# Patient Record
Sex: Male | Born: 1935 | Race: White | Hispanic: No | Marital: Married | Smoking: Former smoker
Health system: Southern US, Community
[De-identification: ages and names within clinical notes are randomized; demographics above are authoritative.]

## PROBLEM LIST (undated history)

## (undated) DIAGNOSIS — D649 Anemia, unspecified: Secondary | ICD-10-CM

## (undated) DIAGNOSIS — K279 Peptic ulcer, site unspecified, unspecified as acute or chronic, without hemorrhage or perforation: Secondary | ICD-10-CM

## (undated) DIAGNOSIS — N400 Enlarged prostate without lower urinary tract symptoms: Secondary | ICD-10-CM

## (undated) DIAGNOSIS — R9430 Abnormal result of cardiovascular function study, unspecified: Secondary | ICD-10-CM

## (undated) DIAGNOSIS — H269 Unspecified cataract: Secondary | ICD-10-CM

## (undated) DIAGNOSIS — I10 Essential (primary) hypertension: Secondary | ICD-10-CM

## (undated) DIAGNOSIS — K219 Gastro-esophageal reflux disease without esophagitis: Secondary | ICD-10-CM

## (undated) DIAGNOSIS — C44101 Unspecified malignant neoplasm of skin of unspecified eyelid, including canthus: Secondary | ICD-10-CM

## (undated) DIAGNOSIS — T4145XA Adverse effect of unspecified anesthetic, initial encounter: Secondary | ICD-10-CM

## (undated) DIAGNOSIS — T8859XA Other complications of anesthesia, initial encounter: Secondary | ICD-10-CM

## (undated) DIAGNOSIS — I4819 Other persistent atrial fibrillation: Secondary | ICD-10-CM

## (undated) DIAGNOSIS — M1990 Unspecified osteoarthritis, unspecified site: Secondary | ICD-10-CM

## (undated) DIAGNOSIS — IMO0002 Reserved for concepts with insufficient information to code with codable children: Secondary | ICD-10-CM

## (undated) DIAGNOSIS — L0390 Cellulitis, unspecified: Secondary | ICD-10-CM

## (undated) DIAGNOSIS — E785 Hyperlipidemia, unspecified: Secondary | ICD-10-CM

## (undated) DIAGNOSIS — I4891 Unspecified atrial fibrillation: Secondary | ICD-10-CM

## (undated) DIAGNOSIS — I2510 Atherosclerotic heart disease of native coronary artery without angina pectoris: Secondary | ICD-10-CM

## (undated) HISTORY — DX: Abnormal result of cardiovascular function study, unspecified: R94.30

## (undated) HISTORY — DX: Unspecified cataract: H26.9

## (undated) HISTORY — DX: Hyperlipidemia, unspecified: E78.5

## (undated) HISTORY — PX: EYELID CARCINOMA EXCISION: SHX1563

## (undated) HISTORY — DX: Essential (primary) hypertension: I10

## (undated) HISTORY — DX: Unspecified atrial fibrillation: I48.91

## (undated) HISTORY — DX: Unspecified malignant neoplasm of skin of unspecified eyelid, including canthus: C44.101

## (undated) HISTORY — DX: Atherosclerotic heart disease of native coronary artery without angina pectoris: I25.10

## (undated) HISTORY — PX: TOTAL HIP ARTHROPLASTY: SHX124

## (undated) HISTORY — DX: Gastro-esophageal reflux disease without esophagitis: K21.9

## (undated) HISTORY — PX: TOTAL KNEE ARTHROPLASTY: SHX125

## (undated) HISTORY — DX: Unspecified osteoarthritis, unspecified site: M19.90

## (undated) HISTORY — PX: APPENDECTOMY: SHX54

## (undated) HISTORY — DX: Reserved for concepts with insufficient information to code with codable children: IMO0002

## (undated) HISTORY — DX: Other persistent atrial fibrillation: I48.19

## (undated) HISTORY — DX: Anemia, unspecified: D64.9

## (undated) HISTORY — DX: Cellulitis, unspecified: L03.90

## (undated) HISTORY — DX: Peptic ulcer, site unspecified, unspecified as acute or chronic, without hemorrhage or perforation: K27.9

---

## 1999-08-29 ENCOUNTER — Other Ambulatory Visit: Admission: RE | Admit: 1999-08-29 | Discharge: 1999-08-29 | Payer: Self-pay | Admitting: Urology

## 2000-12-06 ENCOUNTER — Ambulatory Visit (HOSPITAL_COMMUNITY): Admission: RE | Admit: 2000-12-06 | Discharge: 2000-12-06 | Payer: Self-pay | Admitting: Cardiology

## 2000-12-14 ENCOUNTER — Ambulatory Visit (HOSPITAL_COMMUNITY): Admission: RE | Admit: 2000-12-14 | Discharge: 2000-12-14 | Payer: Self-pay | Admitting: Family Medicine

## 2000-12-14 ENCOUNTER — Encounter: Payer: Self-pay | Admitting: Family Medicine

## 2003-10-13 HISTORY — PX: OTHER SURGICAL HISTORY: SHX169

## 2003-10-13 HISTORY — PX: BYPASS GRAFT: SHX909

## 2003-11-01 ENCOUNTER — Ambulatory Visit (HOSPITAL_COMMUNITY): Admission: RE | Admit: 2003-11-01 | Discharge: 2003-11-01 | Payer: Self-pay | Admitting: Internal Medicine

## 2004-01-11 HISTORY — PX: CORONARY ARTERY BYPASS GRAFT: SHX141

## 2004-01-23 ENCOUNTER — Inpatient Hospital Stay (HOSPITAL_COMMUNITY): Admission: RE | Admit: 2004-01-23 | Discharge: 2004-02-08 | Payer: Self-pay | Admitting: Orthopedic Surgery

## 2004-02-08 ENCOUNTER — Encounter: Payer: Self-pay | Admitting: Thoracic Surgery (Cardiothoracic Vascular Surgery)

## 2004-04-03 ENCOUNTER — Emergency Department (HOSPITAL_COMMUNITY): Admission: EM | Admit: 2004-04-03 | Discharge: 2004-04-03 | Payer: Self-pay | Admitting: Emergency Medicine

## 2004-09-06 ENCOUNTER — Inpatient Hospital Stay (HOSPITAL_COMMUNITY): Admission: EM | Admit: 2004-09-06 | Discharge: 2004-09-09 | Payer: Self-pay | Admitting: Emergency Medicine

## 2004-09-06 ENCOUNTER — Encounter (INDEPENDENT_AMBULATORY_CARE_PROVIDER_SITE_OTHER): Payer: Self-pay | Admitting: *Deleted

## 2005-01-01 ENCOUNTER — Ambulatory Visit: Payer: Self-pay | Admitting: Cardiology

## 2005-02-19 ENCOUNTER — Ambulatory Visit: Payer: Self-pay | Admitting: Internal Medicine

## 2005-05-07 ENCOUNTER — Ambulatory Visit: Payer: Self-pay | Admitting: Cardiology

## 2005-05-12 ENCOUNTER — Ambulatory Visit: Payer: Self-pay | Admitting: Internal Medicine

## 2005-07-01 ENCOUNTER — Ambulatory Visit: Payer: Self-pay | Admitting: Cardiology

## 2005-12-24 ENCOUNTER — Ambulatory Visit: Payer: Self-pay | Admitting: Internal Medicine

## 2006-03-11 ENCOUNTER — Ambulatory Visit: Payer: Self-pay | Admitting: Internal Medicine

## 2007-07-27 ENCOUNTER — Ambulatory Visit: Payer: Self-pay

## 2008-03-20 ENCOUNTER — Inpatient Hospital Stay (HOSPITAL_COMMUNITY): Admission: RE | Admit: 2008-03-20 | Discharge: 2008-03-26 | Payer: Self-pay | Admitting: Orthopedic Surgery

## 2008-03-20 ENCOUNTER — Ambulatory Visit: Payer: Self-pay | Admitting: Cardiology

## 2008-04-11 ENCOUNTER — Ambulatory Visit: Payer: Self-pay | Admitting: Cardiology

## 2008-05-09 ENCOUNTER — Ambulatory Visit: Payer: Self-pay | Admitting: Cardiology

## 2008-05-11 ENCOUNTER — Ambulatory Visit: Payer: Self-pay | Admitting: Cardiology

## 2008-05-11 ENCOUNTER — Ambulatory Visit (HOSPITAL_COMMUNITY): Admission: RE | Admit: 2008-05-11 | Discharge: 2008-05-11 | Payer: Self-pay | Admitting: Cardiology

## 2008-05-30 ENCOUNTER — Ambulatory Visit: Payer: Self-pay | Admitting: Cardiology

## 2008-08-29 ENCOUNTER — Ambulatory Visit: Payer: Self-pay | Admitting: Cardiology

## 2008-10-24 ENCOUNTER — Ambulatory Visit: Payer: Self-pay | Admitting: Cardiology

## 2008-10-26 ENCOUNTER — Encounter: Payer: Self-pay | Admitting: Cardiology

## 2008-10-26 ENCOUNTER — Ambulatory Visit: Payer: Self-pay

## 2008-10-31 ENCOUNTER — Ambulatory Visit: Payer: Self-pay | Admitting: Cardiology

## 2009-02-25 DIAGNOSIS — K219 Gastro-esophageal reflux disease without esophagitis: Secondary | ICD-10-CM | POA: Insufficient documentation

## 2009-02-25 DIAGNOSIS — I2510 Atherosclerotic heart disease of native coronary artery without angina pectoris: Secondary | ICD-10-CM | POA: Insufficient documentation

## 2009-02-25 DIAGNOSIS — D649 Anemia, unspecified: Secondary | ICD-10-CM | POA: Insufficient documentation

## 2009-02-25 DIAGNOSIS — M1990 Unspecified osteoarthritis, unspecified site: Secondary | ICD-10-CM | POA: Insufficient documentation

## 2009-02-25 DIAGNOSIS — I4819 Other persistent atrial fibrillation: Secondary | ICD-10-CM | POA: Insufficient documentation

## 2009-02-25 DIAGNOSIS — I10 Essential (primary) hypertension: Secondary | ICD-10-CM

## 2009-02-27 ENCOUNTER — Ambulatory Visit: Payer: Self-pay | Admitting: Cardiology

## 2009-06-19 ENCOUNTER — Encounter (INDEPENDENT_AMBULATORY_CARE_PROVIDER_SITE_OTHER): Payer: Self-pay | Admitting: *Deleted

## 2009-08-28 ENCOUNTER — Encounter: Payer: Self-pay | Admitting: Cardiology

## 2009-12-11 ENCOUNTER — Ambulatory Visit: Payer: Self-pay | Admitting: Cardiology

## 2009-12-11 DIAGNOSIS — E663 Overweight: Secondary | ICD-10-CM | POA: Insufficient documentation

## 2010-11-11 NOTE — Assessment & Plan Note (Signed)
Summary: [facility]  Medications Added FUROSEMIDE 20 MG TABS (FUROSEMIDE) 1 by mouth daily DILTIAZEM HCL 120 MG TABS (DILTIAZEM HCL) 1 by mouth daily CARVEDILOL 12.5 MG TABS (CARVEDILOL) 1 by mouth two times a day CRESTOR 10 MG TABS (ROSUVASTATIN CALCIUM) 1 by mouth daily WARFARIN SODIUM 5 MG TABS (WARFARIN SODIUM) as directed TYLENOL ARTHRITIS PAIN 650 MG CR-TABS (ACETAMINOPHEN) as needed      Allergies Added: ! PENICILLIN  Visit Type:  Follow-up Primary Provider:  Dr. Vernon Prey  CC:  Atrial Fibrillation.  History of Present Illness: The patient presents for followup of his cardiomyopathy, atrial fibrillation and coronary disease. Since I last saw him he has done well. He has had no new cardiovascular symptoms and denies palpitations, presyncope or syncope. He has had no chest discomfort, neck or arm discomfort. He has had no weight gain or edema. He denies any shortness of breath and is able to do yard work and bowl. He has not had PND or orthopnea.  Current Medications (verified): 1)  Furosemide 20 Mg Tabs (Furosemide) .Marland Kitchen.. 1 By Mouth Daily 2)  Diltiazem Hcl 120 Mg Tabs (Diltiazem Hcl) .Marland Kitchen.. 1 By Mouth Daily 3)  Carvedilol 12.5 Mg Tabs (Carvedilol) .Marland Kitchen.. 1 By Mouth Two Times A Day 4)  Crestor 10 Mg Tabs (Rosuvastatin Calcium) .Marland Kitchen.. 1 By Mouth Daily 5)  Warfarin Sodium 5 Mg Tabs (Warfarin Sodium) .... As Directed 6)  Tylenol Arthritis Pain 650 Mg Cr-Tabs (Acetaminophen) .... As Needed  Allergies (verified): 1)  ! Penicillin  Past History:  Past Medical History:  Hypertension, persistent atrial fibrillation,   coronary artery disease (status post CABG in April 2005, LIMA to the   LAD, SVG to PDA, SVG to ramus intermediate), mildly reduced ejection   fraction (EF 40% by echo), anemia, cellulitis in the past,   degenerative joint disease,  gastroesophageal reflux disease, peptic ulcer disease,   skin cancer in  his eyelid due for resection.   Past Surgical History:  1.  Includes heart bypass in 2005.   2. He has had a right total knee arthroplasty  3. Appendectomy.   4. Eyelid surgery (skin cancer resected)  5. Right total hip  Review of Systems       As stated in the HPI and negative for all other systems.   Vital Signs:  Patient profile:   77 year old male Height:      67 inches Weight:      200 pounds BMI:     31.44 Pulse rate:   82 / minute Resp:     16 per minute BP sitting:   126 / 84  (right arm)  Vitals Entered By: Marrion Coy, CNA (December 11, 2009 2:17 PM)  Physical Exam  General:  Well developed, well nourished, in no acute distress. Head:  normocephalic and atraumatic Eyes:  PERRLA/EOM intact; conjunctiva and lids normal. Mouth:  Upper dentures, just agums and palate normal. Oral mucosa normal. Neck:  Neck supple, no JVD. No masses, thyromegaly or abnormal cervical nodes. Chest Wall:  Well-healed sternotomy scar Lungs:  Clear bilaterally to auscultation and percussion. Heart:  Non-displaced PMI, chest non-tender; irregular rate and rhythm, S1, S2 without murmurs, rubs or gallops. Carotid upstroke normal, no bruit. Normal abdominal aortic size, no bruits. Femorals normal pulses, no bruits. Pedals normal pulses. No edema, no varicosities. Abdomen:  Bowel sounds positive; abdomen soft and non-tender without masses, organomegaly, or hernias noted. No hepatosplenomegaly, obese Msk:  Back normal, normal gait. Muscle strength  and tone normal. Extremities:  No clubbing or cyanosis. Neurologic:  Alert and oriented x 3. Skin:  Intact without lesions or rashes. Cervical Nodes:  no significant adenopathy Axillary Nodes:  no significant adenopathy Inguinal Nodes:  no significant adenopathy Psych:  Normal affect.   EKG  Procedure date:  12/11/2009  Findings:      atrial fibrillation, rate 82, axis within normal limits, intervals within normal limits, premature ventricular contractions, no acute ST-T wave changes.  Impression &  Recommendations:  Problem # 1:  [name] (ICD-414.00) The patient had a stress test last year and no new symptoms since then. No further cardiovascular testing is suggested. He will continue with risk reduction.  Problem # 2:  FIBRILLATION, ATRIAL (ICD-427.31) He tolerates fibrillation with anticoagulation and rate control. No change in therapy is indicated. Orders: EKG w/ Interpretation (93000)  Problem # 3:  HYPERTENSION (ICD-401.9) His blood pressure is controlled and he will continue the meds as listed.  Problem # 4:  OVERWEIGHT (ICD-278.02) The patient understands the need to lose weight with diet and exercise.  Patient Instructions: 1)  Your physician recommends that you schedule a follow-up appointment in: 1 year  2)  Your physician recommends that you continue on your current medications as directed. Please refer to the Current Medication list given to you today.

## 2010-12-17 ENCOUNTER — Ambulatory Visit: Payer: Self-pay | Admitting: Cardiology

## 2010-12-24 ENCOUNTER — Ambulatory Visit (INDEPENDENT_AMBULATORY_CARE_PROVIDER_SITE_OTHER): Payer: Medicare Other | Admitting: Cardiology

## 2010-12-24 ENCOUNTER — Encounter: Payer: Self-pay | Admitting: Cardiology

## 2010-12-24 DIAGNOSIS — I2510 Atherosclerotic heart disease of native coronary artery without angina pectoris: Secondary | ICD-10-CM

## 2010-12-30 NOTE — Assessment & Plan Note (Signed)
Summary: 11yr rov [id number] pfh,rn per wife/jml/tt  Medications Added LOVASTATIN 40 MG TABS (LOVASTATIN) 1 by mouth daily VITAMIN D 1000 UNIT TABS (CHOLECALCIFEROL) 1 by mouth daily      Allergies Added:   Visit Type:  Follow-up Primary Provider:  Dr. Vernon Prey  CC:  [name].  History of Present Illness: The patient presents for followup of his known coronary disease. Since I last saw him he has had no new cardiovascular complaints. He has had discomfort in his leg it is related to get a knee replacement. He is still able to walk frequently back and forth in his shop in his yard. With this he denies any chest pressure, neck or arm discomfort. He denies any shortness of breath, PND or orthopnea. He has had no weight gain or edema.  Current Medications (verified): 1)  Furosemide 20 Mg Tabs (Furosemide) .Marland Kitchen.. 1 By Mouth Daily 2)  Diltiazem Hcl 120 Mg Tabs (Diltiazem Hcl) .Marland Kitchen.. 1 By Mouth Daily 3)  Carvedilol 12.5 Mg Tabs (Carvedilol) .Marland Kitchen.. 1 By Mouth Two Times A Day 4)  Lovastatin 40 Mg Tabs (Lovastatin) .Marland Kitchen.. 1 By Mouth Daily 5)  Warfarin Sodium 5 Mg Tabs (Warfarin Sodium) .... As Directed 6)  Tylenol Arthritis Pain 650 Mg Cr-Tabs (Acetaminophen) .... As Needed 7)  Vitamin D 1000 Unit Tabs (Cholecalciferol) .Marland Kitchen.. 1 By Mouth Daily  Allergies (verified): 1)  ! Penicillin  Past History:  Past Medical History: Reviewed history from 12/11/2009 and no changes required.  Hypertension, persistent atrial fibrillation,   coronary artery disease (status post CABG in April 2005, LIMA to the   LAD, SVG to PDA, SVG to ramus intermediate), mildly reduced ejection   fraction (EF 40% by echo), anemia, cellulitis in the past,   degenerative joint disease,  gastroesophageal reflux disease, peptic ulcer disease,   skin cancer in  his eyelid due for resection.   Past Surgical History: Reviewed history from 12/11/2009 and no changes required.  1. Includes heart bypass in 2005.   2. He has had a right  total knee arthroplasty  3. Appendectomy.   4. Eyelid surgery (skin cancer resected)  5. Right total hip  Review of Systems       As stated in the HPI and negative for all other systems.   Vital Signs:  Patient profile:   77 year old male Height:      67 inches Weight:      197 pounds BMI:     30.97 Pulse rate:   88 / minute Resp:     16 per minute BP sitting:   164 / 98  (right arm)  Vitals Entered By: Marrion Coy, CNA (December 24, 2010 1:36 PM)  Physical Exam  General:  Well developed, well nourished, in no acute distress. Head:  normocephalic and atraumatic Mouth:  Upper dentures, just agums and palate normal. Oral mucosa normal. Neck:  Neck supple, no JVD. No masses, thyromegaly or abnormal cervical nodes. Chest Wall:  Well-healed sternotomy scar Lungs:  Clear bilaterally to auscultation and percussion. Heart:  Non-displaced PMI, chest non-tender; irregular rate and rhythm, S1, S2 without murmurs, rubs or gallops. Carotid upstroke normal, no bruit. Normal abdominal aortic size, no bruits. Femorals normal pulses, no bruits. Pedals normal pulses. No edema, no varicosities. Abdomen:  Bowel sounds positive; abdomen soft and non-tender without masses, organomegaly, or hernias noted. No hepatosplenomegaly, obese Msk:  Back normal, normal gait. Muscle strength and tone normal. Extremities:  No clubbing or cyanosis. Neurologic:  Alert and oriented  x 3. Skin:  Intact without lesions or rashes. Cervical Nodes:  no significant adenopathy Inguinal Nodes:  no significant adenopathy Psych:  Normal affect.    EKG  Procedure date:  12/24/2010  Findings:      Atrial fibrillation, rate 95, axis within normal limits, premature ectopic complexes.  Impression & Recommendations:  Problem # 1:  [name] (ICD-414.00) At this point he's had no new cardiovascular symptoms. He will continue with risk reduction. No change in therapy is indicated.  Problem # 2:  FIBRILLATION, ATRIAL  (ICD-427.31) Patient tolerates this rhythm. He tolerate anticoagulation and has had good rate control. No change in therapy is indicated. Orders: EKG w/ Interpretation (93000)  Problem # 3:  HYPERTENSION (ICD-401.9) His blood pressure is slightly elevated. This was noted by Dr. Christell Constant last year as well. However, I reviewed blood pressure diary kept and, although somewhat labile, utilizing a general control. He reports good blood pressure control when he checks it at home or with his nurse at a senior group. Therefore, I will make no change to his regimen.  Patient Instructions: 1)  Your physician recommends that you schedule a follow-up appointment in: 12 months with Dr Antoine Poche in [place] 2)  Your physician recommends that you continue on your current medications as directed. Please refer to the Current Medication list given to you today.

## 2011-02-24 NOTE — Op Note (Signed)
[name], CARMER                  ACCOUNT NO.:  [id number]   MEDICAL RECORD NO.:  [id number]          PATIENT TYPE:  INP   LOCATION:  0005                         FACILITY:  [facility]   PHYSICIAN:  Georges Lynch. Gioffre, M.D.DATE OF BIRTH:  28-Jun-1932   DATE OF PROCEDURE:  03/20/2008  DATE OF DISCHARGE:                               OPERATIVE REPORT   SURGEON:  Georges Lynch. Darrelyn Hillock, M.D.   ASSISTANT:  Madlyn Frankel. Charlann Boxer, M.D. and Jamelle Rushing, P.A.   PREOPERATIVE DIAGNOSIS:  Degenerative arthritis, right hip.   POSTOPERATIVE DIAGNOSIS:  Degenerative arthritis, right hip.   OPERATION:  Right total hip arthroplasty utilizing DePuy system.   1. I utilized a size 5 right Trilock femoral stem.  2. I utilized a size 54-mm ASR cup.  3. I utilized a +2 C tapered head.  4. I utilized a size 47 femoral head.   PROCEDURE:  Under general anesthesia, routine orthopedic prep and  draping of the right hip was carried out with the patient on his left  side.  The patient had 600 mg of clindamycin preop.   At this time, a posterolateral approach to the hip was carried out.  Bleeders identified and cauterized.  Self-retaining retractors were  inserted.  The incision was carried down through the iliotibial band.  Self-retaining tractors were advanced, with great care not to injure the  sciatic nerve.  At this time, I partially detached the external  rotators.  I dissected the gluteal muscle by blunt dissection proximally  and identified the capsule.  I first incised the piriformis and then did  a capsulectomy and dislocated the head.  Following that, we amputated  the head at the appropriate neck length with the oscillating saw.  We  then utilized our box chisel to get out wide enough on the trochanter.  We then the inserted the canal finder and then rasped the femoral shaft  up to a size 5.  Once the femoral shaft was prepared, we irrigated out  the shaft.  Then attention was directed to the acetabulum.   We completed  a capsulectomy.  I then reamed the acetabulum up to a size 53 mm.  Following that, we then inserted our 54-mm ASR cup.  We made sure we had  good fixation of the cup.  Following that, we went back and inserted our  size 5 Trilock stem.  Prior to doing that, we did utilize the calcar  reamer to even out the femoral neck.  Prior to inserting our permanent  prosthesis, we obviously went through the trials.  We measured leg  lengths and selected a +2 C tapered head.  After the trial reduction was  completed, we then inserted our permanent Trilock stem size 5.  Following that, we went through trials again to make sure that we had  the appropriate head size, etc.  We measured leg lengths once again.  We  then inserted our permanent 47-mm ball with a +2 spacer and made sure  the acetabulum  was free of any soft tissue, and  then we reduced the  hip and had excellent motion.  We went through leg  lengths again.  We had excellent leg length, excellent stability.  We  then reapproximated our soft tissue in the usual fashion.  The skin was  closed with metal staples.  Sterile Neosporin dressing was applied.           ______________________________  Georges Lynch Darrelyn Hillock, M.D.     RAG/MEDQ  D:  03/20/2008  T:  03/20/2008  Job:  [id number]   cc:   [name], MD, [facility]  [address]  [address]  [place]  [place] [address]

## 2011-02-24 NOTE — Assessment & Plan Note (Signed)
[facility]                            CARDIOLOGY OFFICE NOTE   NAME:[name]                         MRN:          [id number]  DATE:02/27/2009                            DOB:          1931-10-14    PRIMARY CARE PHYSICIAN:  Greg Penna, MD   REASON FOR PRESENTATION:  Evaluate the patient with heart failure,  coronary artery disease, and atrial fibrillation.   HISTORY OF PRESENT ILLNESS:  The patient is 77 years old.  When I saw  him in January, he was having some dyspnea.  He did have an  echocardiogram, which demonstrated that his ejection fraction was  unchanged at about 40%.  There was moderate mitral valve regurgitation.  He did have a stress perfusion study, which demonstrated no evidence of  ischemia or transmural infarct.  He was noted to have an elevated BNP  and actually was treated with some Lasix.  He said that he has felt much  better.  He has had much less dyspnea.  He has even started bowling  again.  He is able to do some light yard work without any severe  complaints.  He is not describing any PND or orthopnea.  He is not  really noticing any palpitations, presyncope, or syncope.  He has had no  chest discomfort, weight gain, or lower extremity swelling.  He did have  eyelid surgery and came off Coumadin for this.  This was uneventful.   PAST MEDICAL HISTORY:  1. Hypertension.  2. Persistent atrial fibrillation.  3. Coronary artery disease (status post CABG in April 2005, LIMA to      the LAD, SVG to PDA, SVG to ramus intermediate).  4. Mildly reduced ejection fraction (EF 45% in the past).  5. Anemia.  6. Cellulitis in the past.  7. Degenerative joint disease.  8. Status post left hip replacement.  9. Gastroesophageal reflux disease.  10.Peptic ulcer disease.   ALLERGIES/INTOLERANCES:  PENICILLIN.   MEDICATIONS:  1. Diltiazem 120 mg daily.  2. Coumadin.  3. Crestor 20 mg daily.  4. Fish oil 1000 mg daily.  5.  Furosemide 20 mg daily.  6. Carvedilol 12.5 mg b.i.d.   REVIEW OF SYSTEMS:  As stated in the HPI and otherwise negative for  other systems.   PHYSICAL EXAMINATION:  GENERAL:  The patient is in no distress.  VITAL SIGNS:  Blood pressure 128/84, heart rate 78 and regular, weight  193 pounds, body mass index 30.  HEENT:  Eyelids unremarkable.  Pupils are equal, round, and reactive to  light.  Fundi not visualized.  Oral mucosa unremarkable.  NECK:  No jugular venous distention to 45 degrees.  Carotid upstroke  brisk and symmetric.  No bruits.  No thyromegaly.  LYMPHATICS:  No cervical, axillary, or inguinal adenopathy.  LUNGS:  Clear to auscultation bilaterally.  BACK:  No costovertebral angle tenderness.  CHEST:  Unremarkable.  HEART:  PMI not displaced or sustained.  S1 and S2 within normal limits.  No S3.  No clicks.  No rubs.  No murmurs.  ABDOMEN:  Obese.  Positive bowel sounds.  Normal in frequency and pitch.  No bruits, no rebound, no guarding.  No midline pulsatile mass.  No  hepatomegaly, no splenomegaly.  SKIN:  No rashes and no nodules.  EXTREMITIES:  Pulses 2+.  Trace lower extremity edema.  NEUROLOGIC:  Grossly intact.   EKG, atrial fibrillation.  Axis within normal limits.  Intervals within  normal limits.  No acute ST or T-wave changes.   ASSESSMENT/PLAN:  1. Dyspnea.  The patient's dyspnea is improved.  At this point, no      further cardiovascular testing is suggested.  He should continue on      the regimen as listed including the diuretic as he did have an      elevated BNP.  He is otherwise on a reasonable medical regimen and      should continue with this.  We will not uptitrate his beta-blocker      because of his relatively slow ventricular rate.  2. Atrial fibrillation as above.  He is tolerating Coumadin.  3. Coronary artery disease.  He will continue with risk reduction.  4. Hypertension.  Blood pressure is well controlled on the meds as      listed.  5.  Dyslipidemia per Dr. Christell Constant the goal LDL is less than 100 and HDL      greater than 40.  6. Followup.  I will see him back in 6 months or sooner if he has any      problems.     [name], MD, [facility]  Electronically Signed    JH/MedQ  DD: 02/27/2009  DT: 02/28/2009  Job #: [id number]   cc:   [name], M.D.

## 2011-02-24 NOTE — Assessment & Plan Note (Signed)
[facility]                            CARDIOLOGY OFFICE NOTE   NAME:[name], [name]                         MRN:          [id number]  DATE:04/11/2008                            DOB:          1932/01/13    PRIMARY CARE PHYSICIAN:  [name], MD.   REASON FOR PRESENTATION:  Evaluate the patient with atrial fibrillation,  status post right hip arthroplasty.   HISTORY OF PRESENT ILLNESS:  The patient is a very pleasant 77 year old  gentleman who I saw recently in the hospital.  He was on the orthopedic  service after right hip replacement.  He developed atrial fibrillation  with a rapid rate.  This was controlled with medications as listed.  He  had been started on Coumadin.  He did feel tachy palpitations while in  the hospital.  However, since going home, he has had none of this.  He  is rehabbing.  He has some residual discomfort, but is improving.  He  has not had any presyncope or syncope.  He has not had any shortness of  breath, PND, or orthopnea.  He has not any chest discomfort, neck or arm  discomfort.   PAST MEDICAL HISTORY:  1. Coronary artery disease (status post CABG in April 2005 with a LIMA      to the LAD, SVG to PDA, SVG to ramus intermediate).  2. Mildly reduced ejection fraction (46%).  3. History of atrial fibrillation in the past, treated with      cardioversion.  4. Postoperative anemia.  5. Right lower extremity cellulitis.  6. Degenerative joint disease, status post right total knee      replacement and now right total hip replacement.  7. Hypertension.  8. Dyslipidemia.   ALLERGIES:  1. PENICILLIN caused rash.  2. PRAVACHOL and NONSTEROIDALS caused upset stomach.   MEDICATIONS:  1. Digitek 0.125 mg daily.  2. Fish oil.  3. Diltiazem 120 mg daily.  4. Metoprolol 50 mg daily.  5. Coumadin.  6. Crestor 20 mg daily.   REVIEW OF SYSTEMS:  As stated in the HPI and otherwise negative for  other systems.   PHYSICAL EXAMINATION:  GENERAL:  The patient is in no distress.  VITAL SIGNS:  Blood pressure 120/72, heart rate 77 and regular.  HEENT:  Eyelids are unremarkable.  Pupils are equal, round, and reactive  to light.  Fundi not visualized.  Oral mucosa unremarkable.  NECK:  No jugular venous distention at 45 degrees.  Carotid upstroke  brisk and symmetrical.  No bruits.  No thyromegaly.  LYMPHATICS:  No cervical, axillary, or inguinal adenopathy.  LUNGS:  Clear to auscultation bilaterally.  BACK:  No costovertebral angle tenderness.  CHEST:  Unremarkable.  HEART:  PMI not displaced or sustained.  S1 and S2 within normal limits.  No S3, no S4, no clicks, no rubs, and no murmurs.  ABDOMEN:  Mildly obese.  Positive bowel sounds, normal in frequency and  pitch.  No bruits, no rebound, no guarding, no midline pulsatile mass.  No hepatomegaly and no splenomegaly.  SKIN:  No rashes.  No nodules.  EXTREMITIES:  Pulses are 2+.  No edema.   ASSESSMENT/PLAN:  1. Atrial fibrillation.  The patient is in persistent atrial      fibrillation.  He is on anticoagulation.  Though I do not think I      will discontinue the Coumadin going forward given his age and risk      factors, I will plan on cardioverting him to see if he can maintain      sinus rhythm.  He will need to be on therapeutic anticoagulation      for 3-4 weeks.  I am going to coordinate this with [facility] and plan cardioversion as described.  2. Coronary artery disease.  He is having no symptoms related to this.      No further cardiovascular testing is suggested.  He will continue      with secondary risk reduction.  3. Dyslipidemia per [facility].  The goal will be an LDL less      than 100 and HDL greater than 40.  4. Mildly reduced ejection fraction.  On review his hospital records,      as I believe we checked an echocardiogram, though I do not have      this report.  Right now, he is not having  symptoms related to this.      He will continue with medical management.  5. Hypertension.  Blood pressure is controlled with medications as      listed.  6. Followup.  We will see the patient again at the time of      cardioversion.     [name], MD, [facility]  Electronically Signed    JH/MedQ  DD: 04/11/2008  DT: 04/12/2008  Job #: [id number]   cc:   [name], M.D.

## 2011-02-24 NOTE — Assessment & Plan Note (Signed)
[facility]                            CARDIOLOGY OFFICE NOTE   TERREL, NESHEIWAT                         MRN:          [id number]  DATE:08/29/2008                            DOB:          1932/03/24    PRIMARY CARE Vadis Slabach:  [name], MD   REASON FOR PRESENTATION:  Evaluate the patient with atrial fibrillation.   HISTORY OF PRESENT ILLNESS:  The patient is 77 years old.  He has  persistent atrial fibrillation having converted back to AFib after  cardioversion.  He is on Coumadin.  He has had some problems recently  with his Coumadin and swelling in his knee and right knee and right hip.  This had been apparently a hemorrhagic effusion in the past.  Seems to  be related to his Coumadin level jumping up.  This has being regulated  at [facility].  It has currently being held pending eyelid  surgery.  However, we do plan to continue hopefully with an INR of 2-3.  He has also had some muscle aches.  He came off of Crestor for about 10  days and did not have any improvement in this.  Despite these  limitations, he tries to be very active.  He walks every day.  He is not  having any chest discomfort, neck or arm discomfort.  He does not really  notice palpitations and has had no presyncope or syncope.  His heart  rate seems to be controlled when he checks it at home.   PAST MEDICAL HISTORY:  Hypertension, persistent atrial fibrillation,  coronary artery disease status post CABG (April 2005, LIMA to the LAD,  SVG to PDA, SVG to ramus intermediate), mildly reduced ejection fraction  (EF of about 45%), anemia, cellulitis in the past, degenerative joint  disease status post left hip replacement, gastroesophageal reflux  disease, peptic ulcer disease.   ALLERGIES AND INTOLERANCES:  To PENICILLIN.   MEDICATIONS:  1. Fish oil.  2. Diltiazem 120 mg daily.  3. Metoprolol 50 mg daily.  4. Coumadin.  5. Crestor 20 mg daily.  6. Aspirin 81 mg  daily.   REVIEW OF SYSTEMS:  As stated in the HPI and otherwise negative for  other systems.   PHYSICAL EXAMINATION:  The patient is in no distress.  Blood pressure  138/84, heart rate 80s and irregular.  HEENT:  Eyes are unremarkable.  Pupils equal, round, and reactive to  light.  Fundi not visualized.  Oral mucosa unremarkable.  NECK:  No jugular venous distention at 45 degrees.  Carotid upstroke  brisk and symmetrical.  No bruits, no thyromegaly.  LYMPHATICS:  No adenopathy.  LUNGS:  Clear to auscultation bilaterally.  BACK:  No costovertebral angle tenderness.  CHEST:  Unremarkable.  HEART:  PMI not displaced or sustained, S1 and S2 within normal limits.  No S3, no murmurs.  ABDOMEN:  Obese, positive bowel sounds, normal in frequency and pitch,  no bruits, no rebound, no guarding, no midline pulsatile mass, no  hepatomegaly, no splenomegaly.  SKIN:  No rashes, no nodules.  EXTREMITIES:  Pulses  2+ throughout, mild right knee effusion, no  cyanosis or clubbing.  NEURO:  Oriented to person, place, and time.  Cranial nerves grossly  intact, motor grossly intact.   EKG atrial fibrillation, premature ventricular contractions, axis within  normal limits, intervals within normal limits, no acute ST-wave changes.   ASSESSMENT AND PLAN:  1. Atrial fibrillation.  He has good rate control.  We are going to      try to continue the Coumadin with INR between 2 and 3.  However, if      he continues to have knee effusions that we think may be related to      this, he will simply have to come off the drug.  I would not      continue him if we cannot keep him at least near a therapeutic INR      of 2.  2. Muscle aches.  He is concerned about these.  He did come off of      Crestor for 10 days without improvement.  I might suggest to Dr.      Christell Constant stopping for a month to see if this is related as this so      much sounds like it could be a statin-induced myositis.  I will      defer to Dr.  Christell Constant who follows him closely.  3. Coronary artery disease having no new symptoms.  No further      cardiovascular testing is planned.  4. Hypertension.  Blood pressure is controlled.  He will continue the      meds as listed.  5. Followup.  I will see the patient again in about 3 months or sooner      if needed.     [name], MD, [facility]  Electronically Signed    JH/MedQ  DD: 08/29/2008  DT: 08/30/2008  Job #: [id number]   cc:   [name], M.D.

## 2011-02-24 NOTE — Assessment & Plan Note (Signed)
[facility]                            CARDIOLOGY OFFICE NOTE   ESGAR, BARNICK                         MRN:          [id number]  DATE:05/30/2008                            DOB:          28-Mar-1932    PRIMARY CARE PHYSICIAN:  [name], MD   REASON FOR PRESENTATION:  Evaluate the patient with atrial fibrillation,  status post cardioversion.   HISTORY OF PRESENT ILLNESS:  The patient is a very pleasant 77 year old  gentleman who has atrial fibrillation.  He actually underwent  cardioversion on May 11, 2008.  This was re-symptomatic atrial  fibrillation.  He now returns for followup and has back in for atrial  fibrillation.  He does not know, when this happened.  He does not notice  it.  He is not feeling any palpitations, presyncope, or syncope.  He is  not having any new fatigue.  He has had no chest pain or shortness of  breath.  He has had no neck or arm discomfort.  He has had no PND or  orthopnea.  He has been doing a mile walk every day.  He is recovering  from hip surgery.   Of note, in the hospital, he did have atrial fibrillation after his hip  surgery and his rate was difficult to control.  He is tolerating  Coumadin.   PAST MEDICAL HISTORY:  1. Hypertension.  2. Atrial fibrillation.  3. Coronary artery disease, status post CABG (in April 2005, LIMA to      the LAD, SVG to PDA, SVG to ramus intermediate).  4. Mildly reduced ejection fraction (EF 45%).  5. Anemia.  6. Previous cellulitis.  7. Degenerative joint disease, status post left hip replacement.  8. Gastroesophageal reflux disease.  9. Peptic ulcer disease.   ALLERGIES/INTOLERANCES:  PENICILLIN.   MEDICATIONS:  1. Fish oil.  2. Diltiazem 120 mg daily.  3. Metoprolol 50 mg daily.  4. Coumadin.  5. Crestor 20 mg daily.   REVIEW OF SYSTEMS:  As stated in the HPI, otherwise, negative for all  other systems.   PHYSICAL EXAMINATION:  GENERAL:  The patient is  pleasant and in no  distress.  VITAL SIGNS:  Blood pressure 126/78, heart rate 77 and irregular, weight  197 pounds, and body mass index 29.  HEENT:  Eyelids are unremarkable; pupils equal, round, and reactive to  light; fundi not visualized; oral unremarkable.  BACK:  No costovertebral angle tenderness.  CHEST:  Unremarkable.  HEART:  PMI not displaced or sustained; S1 and S2 within normal limits;  no S3, no murmurs.  ABDOMEN:  Obese; positive bowel sounds; normal in frequency and pitch;  no bruits, no rebound, no guarding, no midline pulsatile mass; no  hepatomegaly and no splenomegaly.  SKIN:  No rashes, no nodules.  EXTREMITIES:  A 2+ pulses throughout; no edema, no cyanosis, and no  clubbing.  NEURO:  Grossly intact.   EKG, atrial fibrillation, premature ectopic complex, axis within normal  limits, intervals within normal limits, and no acute ST-T wave changes.   ASSESSMENT AND PLAN:  1.  Atrial fibrillation.  The patient is back in atrial fibrillation.      He really is not tolerating this.  He really is not having any      symptoms related to this rhythm.  He does not know when it      recurred.  He is tolerating Coumadin.  Given this, I will plan on      pursuing rate control and anticoagulation.  Toward that end, he      will need a Holter monitor to make sure he has adequate rate      control.  He will remain on his medicines as listed for now.  2. Coronary artery disease, having no symptoms.  No further      cardiovascular testing is suggested.  He will continue with risk      reduction.  3. Hypertension.  Blood pressure is well-controlled and he will      continue on the medications as listed.  4. Dyslipidemia and follow with Dr. Christell Constant.  The goal LDL would be less      than 100 and HDL greater than 40.  5. Followup.  I will see him in 6 months or sooner as needed based on      the Holter monitor.     [name], MD, [facility]  Electronically Signed    JH/MedQ   DD: 05/30/2008  DT: 05/31/2008  Job #: [id number]   cc:   [name], M.D.

## 2011-02-24 NOTE — Op Note (Signed)
NAMECAYLOR, [name]                  ACCOUNT NO.:  [id number]   MEDICAL RECORD NO.:  [id number]          PATIENT TYPE:  OIB   LOCATION:  2899                         FACILITY:  [facility]   PHYSICIAN:  [name], MD, FACCDATE OF BIRTH:  [birth year]   DATE OF PROCEDURE:  05/11/2008  DATE OF DISCHARGE:  05/11/2008                               OPERATIVE REPORT   PRIMARY CARE PHYSICIAN:  [name], MD.   PROCEDURE:  Direct current cardioversion.   INDICATIONS:  Symptomatic atrial fibrillation.   PROCEDURE NOTE:  The patient had appropriate informed consent.  We made  sure that he was on therapeutic anticoagulation for 4 weeks before the  procedure, and the INR was verified to be greater than 2 on the day of  the procedure.  His airway was managed per Anesthesia with bag  ventilation.  He was appropriately monitored.  He was given 225 mg of  Pentothal per Dr. Michelle Piper for sedation.  He was cardioverted x1 with 150  joules of biphasic energy.  This resulted normal sinus rhythm with  premature atrial contractions.  There were no apparent complications.  The EKG is pending.   PLAN:  The patient will stop his digoxin.  I will keep him on his other  medicines and particularly his Coumadin.  He has a followup scheduled  for me on May 30, 2008, at 3:15.      [name], MD, [facility]  Electronically Signed     JH/MEDQ  D:  05/11/2008  T:  05/12/2008  Job:  [id number]   cc:   [name], M.D.

## 2011-02-24 NOTE — H&P (Signed)
NAMEJESSE, NOSBISCH                  ACCOUNT NO.:  [id number]   MEDICAL RECORD NO.:  [id number]          PATIENT TYPE:  INP   LOCATION:  NA                           [facility]ACILITY[facility]  [facility]   PHYSICIAN:  Georges Lynch. Gioffre, M.D.DATE OF BIRTH:  1932-03-09   DATE OF ADMISSION:  03/20/2008  DATE OF DISCHARGE:                              HISTORY & PHYSICAL   PRIMARY CARE PHYSICIAN:  Dr. Rudi Heap.   CARDIOLOGIST:  Dr. Antoine Poche.   CHIEF COMPLAINT:  Painful right hip.   HISTORY OF PRESENT ILLNESS:  The patient is a 77 year old gentleman here  today for admission history and physical for his right total hip  arthroplasty.  The patient has been having progressively worsening  painful range of motion of his right hip.  He has failed conservative  treatment.  The patient has elected to proceed with a total hip  arthroplasty.  X-ray shows he has significant arthritic changes of the  right hip.  He has also got arthritic changes of the left hip.   PAST MEDICAL HISTORY:  1. Includes hypertension.  2. History of MI with cardiac bypass in 2005.  3. Reflux disease.  4. History of gastric ulcer many years previous.  5. Hypercholesterolemia.  6. Irregular heart rhythm.   CURRENT MEDICATIONS:  1. Aspirin 81 mg twice a day.  2. Amlodipine 5 mg a day.  3. Benazepril 40 mg a day.  4. Crestor 20 mg a day.  5. Digitek 0.125 mg a day.   ALLERGIES:  To PENICILLIN.   REVIEW OF SYSTEMS:  Negative for any neurologic issues.  He denies any  pulmonary issues.  CARDIOVASCULAR:  He denies any recent chest pains or  problems since his bypass surgery in 2005.  GASTROINTESTINAL:  He does  have occasional reflux.  He uses over-the-counter Prilosec.  He denies  any GU issues.  No endocrine issues.   PAST SURGICAL HISTORY:  1. Includes heart bypass in 2005.  2. He has had a right total knee arthroplasty and appendectomy.  He denies any complications of previous anesthesias.   FAMILY HISTORY:  Father is  deceased at the age 22 from a stroke.  Mother  is deceased at the age of 48 from a stroke.   SOCIAL HISTORY:  The patient is married.  He is retired.  He has smoked  in the past.  He denies any alcohol or drug use.  He lives in a one-  story home.   PHYSICAL EXAM:  VITALS:  Height is 5 feet, 6 inches, weight is 190  pounds, blood pressure is 168/70, pulse of 74 with an occasional skipped  beat.  The patient is afebrile.  GENERAL:  This is a healthy-appearing, well-developed gentleman,  conscious, alert and appropriate, appears to be a good historian,  ambulates with a slight right-sided limp.  HEENT: Head was normocephalic.  Pupils equal and reactive.  Extraocular  movements intact.  Gross hearing is intact.  He had upper dentures in  place.  NECK:  Supple.  No palpable lymphadenopathy.  Good range of motion.  CHEST:  Lung  sounds were clear and equal bilaterally.  No wheezes,  rales, or rhonchi.  HEART:  Grossly regular rate and rhythm with just occasional dropped  beat.  No obvious murmurs.  ABDOMEN:  Soft, nontender.  No CVA region tenderness.  Bowel sounds  present.  EXTREMITIES:  Upper extremities had good range of motion of shoulders,  elbows and wrists.  Motor strength is 5/5.  LOWER EXTREMITIES:  Right hip had full extension, flexion up to 100  degrees.  He had 0 degrees of internal rotation, about 10 degrees of  external rotation limited by mechanical discomfort in the groin.  His  left hip had about 20 degrees internal-external rotation with full  extension and flexion up to 110.  Right knee had a well-healed midline  surgical incision.  He was able to fully extend and flex it back to 120  degrees without any instability or effusion or affection.  His left knee  with full extension and flexion to 130 degrees, no instability, no  effusion, no affection.  Calves were soft and nontender.  Ankles had  good range of motion.  Peripheral vascular carotid pulses were 2+, no  bruits.  Radial pulses  were 2+.  Dorsalis pedis pulse was 2+.  He had no lower extremity edema,  but he did have some lower extremity varicosities.  No pigmentation  changes.  NEURO:  The patient was grossly neurologically intact without any gross  neurologic defects.  BREAST, RECTAL AND GU:  Exams were deferred at this time.   IMPRESSION:  1. 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  2. History of hypertension.  3. History and myocardial infarction with bypass in 2005.  4. Irregular heart rhythm.  5. Reflux disease treated with over-the-counter medications.  6. Upper dentures.  7. History of gastric ulcer many years previous.   PLAN:  The patient will undergo routine labs and tests prior to having a  right total hip arthroplasty by Dr. Darrelyn Hillock at [facility] on  June 9th.  The patient has been evaluated by Dr. Rudi Heap and  Dr.  Antoine Poche and cleared for this upcoming surgery.      Jamelle Rushing, P.A.    ______________________________  Georges Lynch Darrelyn Hillock, M.D.    RWK/MEDQ  D:  03/15/2008  T:  03/15/2008  Job:  [id number]   cc:   Windy Fast A. Darrelyn Hillock, M.D.  Fax: [phone]

## 2011-02-24 NOTE — Assessment & Plan Note (Signed)
[name]                            CARDIOLOGY OFFICE NOTE   Greg, [name]                         MRN:          [id number]  DATE:10/31/2008                            DOB:          1931/11/24    PRIMARY CARE PHYSICIAN:  [name], M.D.   REASON FOR PRESENTATION:  Evaluate the patient with dyspnea.   HISTORY OF PRESENT ILLNESS:  The patient presents for followup of the  complaints from the other day October 24, 2008.  He was having shortness  of breath, it was waking him up at night.  I did give him Prevacid as  some of this sounded like it might be reflux.  He actually stated he has  felt better with that.  He has been able to sleep through the night  through he had 1 episode of shortness of breath since I saw him.  I did  send him for an echocardiogram, which demonstrated his ejection  fraction, which is still be about 40%, which is relatively unchanged  from previous.  Stress perfusion study demonstrated no evidence of  ischemia or transmural infarct.   He has had no new chest discomfort, neck, or arm discomfort.  He was in  atrial fibrillation and was not feeling any new palpitations,  presyncope, or syncope.   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   ALLERGIES AND INTOLERANCE:  PENICILLIN.   MEDICATIONS:  1. Diltiazem 120 mg daily.  2. Metoprolol 50 mg daily.  3. Coumadin.  4. Crestor 20 mg daily.  5. Fish oil.  6. Aspirin 81 mg daily.   REVIEW OF SYSTEMS:  As stated in the HPI and otherwise negative for  other  systems.   PHYSICAL EXAMINATION:  GENERAL:  The patient is in no distress.  VITALS SIGNS:  Blood pressure 120/88, weight 193 pounds, and heart rate  103 and irregular.  NECK:  No jugular venous distension at 45 degrees, carotid upstroke  brisk and symmetrical.  No bruit, no thyromegaly.  LUNGS: Clear to auscultation bilaterally.  CHEST:  Well-healed sternotomy scar.  HEART:  PMI not displaced or sustained, S1 and S2 within normal limits.  No S3.  ABDOMEN:  Mildly obese, positive bowel sounds, normal in frequency and  pitch, no bruits, no rebound, no guarding, no midline pulsatile mass, no  organomegaly.  SKIN:  No rashes, no nodules.  EXTREMITIES:  Pulses 2+, no edema.  NEURO:  Grossly intact.   ASSESSMENT AND PLAN:  1. Dyspnea.  This may be then, since he got better with Prevacid,      related to gastrointestinal reflux.  There is no evidence of      ischemia.  His EF is  not worse.  At this point, I will continue      with meds as listed.  2. Atrial fibrillation.  He is running a little bit high.  I am going      to have him take an extra 25 metoprolol in the evening.  He is okay      to stop his Coumadin for any necessary surgery.  3. Preoperative clearance.  The patient will be at acceptable risk for      the surgery on his eyelid.  He can stop the Coumadin as above.  He      will be in atrial fibrillation.  His rate can be controlled at that      time with IV meds.  He will continue on his oral meds up to the      time of his surgery.  4. Hypertension.  Blood pressure is well controlled.  He will continue      the meds as listed.  5. Followup.  I will see him back in 3 months.     [name], MD, [facility]  Electronically Signed    JH/MedQ  DD: 10/31/2008  DT: 10/31/2008  Job #: [id number]   cc:   [name], M.D.

## 2011-02-24 NOTE — Assessment & Plan Note (Signed)
[name]                            CARDIOLOGY OFFICE NOTE   [name], [name]                         MRN:          [id number]  DATE:05/09/2008                            DOB:          1932/08/22    PRIMARY CARE PHYSICIAN:  [name], MD   REASON FOR PRESENTATION:  Evaluate the patient for atrial fibrillation.   HISTORY OF PRESENT ILLNESS:  The patient returns for followup of his  atrial fibrillation.  I last saw him in the hospital when he had hip  surgery.  He had developed atrial fibrillation at that time and it was  somewhat difficult to control.  He has been on Coumadin since then.  He  has been on medications for rate control.  He is not actually feeling  the palpitations.  He has not had any presyncope or syncope.  The plan  was, however, to perform cardioversion when he had been on therapeutic  anticoagulation.  He came today to talk about this preprocedure and make  sure he is still in atrial fibrillation.   He is denying any chest pain or shortness of breath.  Still has some  joint complaints.  He is due to have eye surgery and actually needs to  come off Coumadin for this as well.  He is not having any PND or  orthopnea.   PAST MEDICAL HISTORY:  1. Hypertension.  2. Atrial fibrillation.  3. Coronary artery disease, status post CABG (April 2005 LIMA to the      LAD, SVG to PDA, SVG to ramus intermediate), mild ejection fraction      (EF 45%).  4. Anemia.  5. Previous cellulitis, lower extremities.  6. Degenerative joint disease, status post left hip replacement.  7. Gastroesophageal reflux disease.  8. Peptic ulcer disease.   ALLERGIES:  Intolerance to PENICILLIN.   CURRENT MEDICATIONS:  1. Digoxin 0.125 mg daily.  2. Fish oil.  3. Diltiazem 120 mg daily.  4. Toprol 50 mg daily.  5. Coumadin.  6. Crestor 20 mg daily.   REVIEW OF SYSTEMS:  As stated in HPI and otherwise negative for other  systems.   PHYSICAL  EXAMINATION:  GENERAL:  The patient is pleasant and in no  distress.  VITAL SIGNS:  Blood pressure 138/80, heart rate 63 and irregular.  Weight 187 pounds, and body mass index 28.  HEENT:  Eyes are unremarkable.  Pupils are equal, round and reactive to  light.  Fundi not visualized.  Oral mucosa unremarkable.  NECK:  No jugular venous distention at 45 degrees, carotid upstroke  brisk and symmetrical.  No bruits, or no thyromegaly.  LYMPHATICS:  No cervical, axillary, or inguinal adenopathy.  LUNGS:  Clear to auscultation bilaterally.  BACK:  No costovertebral angle tenderness.  CHEST:  Well-healed sternotomy scar.  HEART:  PMI not displaced or sustained.  S1 and S2 within normals.  No  S3.  No murmurs.  ABDOMEN:  Mildly obese.  Positive bowel sounds.  Normal in frequency and  pitch.  No bruits, no rebound, no guarding or no midline  pulsatile mass.  No hepatomegaly, or splenomegaly.  SKIN:  No rashes.  EXTREMITIES:  Pulses 2+ throughout.  No edema, cyanosis, or clubbing.  NEURO:  Grossly intact.   ASSESSMENT AND PLAN:  1. Atrial fibrillation.  We had a discussion about the risks, benefits      of cardioversion.  He agrees to proceed.  He maintained sinus      rhythm for a long time after his last cardioversion, so I think      there is some benefit to this.  He knows he need to stay on      Coumadin for 3-4 weeks afterward.  Would have to delay his eye      surgery until that time.  2. Coronary disease, having no ongoing symptoms.  He will continue the      medications as listed.  3. Dyslipidemia.  He is followed closely by Dr. Christell Constant with a goal LDL      of less than 100 and HDL greater than 40.  4. Hypertension.  Blood pressure is well-controlled and he will      continue the medications as listed.  5. Followup.  I will see him at that time of his cardioversion.     [name], MD, [facility]  Electronically Signed    JH/MedQ  DD: 05/09/2008  DT: 05/10/2008  Job #: [id number]    cc:   [name], M.D.

## 2011-02-24 NOTE — Assessment & Plan Note (Signed)
[facility]                            CARDIOLOGY OFFICE NOTE   [name], [name]                         MRN:          [id number]  DATE:10/24/2008                            DOB:          08-05-1932    PRIMARY CARE PHYSICIAN:  [name], M.D.   REASON FOR PRESENTATION:  Evaluate the patient with dyspnea.   HISTORY OF PRESENT ILLNESS:  This patient is a very pleasant 77 year old  gentleman who called to be added on to the schedule day.  He has had  increasing dyspnea.  This has been on and off for a month.  It has been  slowly progressive.  He describes it particularly at night.  He will be  able to lay down on his one pillow.  He will wake up sometimes a few  minutes, sometimes a couple of hours after falling asleep and be short  of breath.  He has to sit up in a chair.  This happens many nights but  not all nights.  He has had some dyspnea with activity during the day as  well.  However, this has not been reproducible.  He has not been quite  as active.  He is not describing any chest discomfort, neck or arm  discomfort.  He has not noticed any palpitation or pre-syncope or  syncope.  He has had no weight gain or swelling.  He has had no change  in his diet or medications.  He is being treated for eyelid cancer and  recently had some biopsies.  He is due to have apparent further surgery  on his eyelid.   Of note, the patient says he felt like this in the past, when he had  reflux.  He said he felt better when he was treated with Prevacid.  He  does say that he is getting some reflux of acid, which has been  recurrent.  He was apparently given samples of something that he does  not recall.  He took this for few days recently but did not notice much  of a difference.   PAST MEDICAL HISTORY:  Hypertension, persistent atrial fibrillation,  coronary artery disease (status post CABG in April 2005, LIMA to the  LAD, SVG to PDA, SVG to ramus  intermediate), mildly reduced ejection  fraction (EF 45% in the past), anemia, cellulitis in the past,  degenerative joint disease, status post left hip replacement,  gastroesophageal reflux disease, and peptic ulcer disease.   ALLERGIES AND INTOLERANCES:  PENICILLIN.   CURRENT MEDICATIONS:  1. Aspirin 81 mg daily.  2. Fish oil 1000 mg daily.  3. Crestor 20 mg daily.  4. Coumadin.  5. Metoprolol 50 mg daily.  6. Diltiazem 120 mg daily.   REVIEW OF SYSTEMS:  As stated in the HPI and otherwise negative for  other systems.   PHYSICAL EXAMINATION:  GENERAL:  The patient is pleasant and in no  distress.  VITAL SIGNS:  Blood pressure 137/85, heart rate 83 and irregular, weight  197 pounds.  HEENT:  Eyelids unremarkable.  Pupils are equal, round,  and reactive to  light; fundi not visualized.  Oral mucosa unremarkable.  NECK:  Jugular venous distention at the angle of the jaw at 45 degrees,  carotid upstroke brisk and symmetric, no bruits, no thyromegaly.  LYMPHATICS:  No cervical, axillary, or inguinal adenopathy.  LUNGS:  Clear to auscultation bilaterally.  BACK:  No costovertebral angle tenderness.  CHEST:  Unremarkable.  HEART:  PMI not displaced or sustained, S1 and S2 within normal limits,  no S3, no murmurs.  ABDOMEN:  Obese, positive bowel sounds, normal in frequency and pitch,  no bruits, no rebound, no guarding, no midline pulsatile mass, no  hepatomegaly, no splenomegaly.  SKIN:  No rashes, no nodules.  EXTREMITIES:  2+ pulses throughout, no edema, no cyanosis, no clubbing.  NEURO:  Oriented to person, place, and time.  Cranial nerves II through  XII grossly intact.  Motor grossly intact.   EKG:  Atrial fibrillation with premature ventricular contractions, axis  within normal limits, intervals within normal limits, no acute ST-T wave  changes.   ASSESSMENT AND PLAN:  1. Dyspnea.  The patient's dyspnea could be reflux.  However, he has      clear jugular venous  distention.  I am worried about this being a      volume issue and heart failure by his description.  He has 5-year-      old bypass grafts and has not had recent evaluation.  Also, had not      looked at his ejection fraction recently.  Therefore, I will get an      echocardiogram and a stress test.  He says he would not be able to      walk on a treadmill because of knee problems.  We will have an      adenosine perfusion study.  I am going to get that done in the next      few days and then see him next week.  I think that his surgery has      to be on hold until we figure this out.  I am also going to treat      him with Prevacid empirically to see if this helps.  2. Atrial fibrillation.  He is maintaining this rhythm.  We did      cardiovert him in September of last year, but he reverted back to      fibrillation.  This could be contributing, and we may need to      consider further therapy.  3. Dyslipidemia.  Per his primary care doctor with a goal LDL less      than 100, HDL greater than 40.  4. Hypertension.  Blood pressure is well controlled, and he will      continue the meds as listed.  5. Gastroesophageal reflux disease as described.     [name], MD, [facility]  Electronically Signed    JH/MedQ  DD: 10/24/2008  DT: 10/25/2008  Job #: [id number]   cc:   [name], M.D.

## 2011-02-24 NOTE — Assessment & Plan Note (Signed)
[facility]                            CARDIOLOGY OFFICE NOTE   NAME:[name], [name]                         MRN:          [id number]  DATE:07/27/2007                            DOB:          1932-03-09    The primary is [name], M.D.   REASON FOR PRESENTATION:  Evaluate patient with coronary disease.   HISTORY OF PRESENT ILLNESS:  The patient is doing well since I last saw  him.  He said he had a scare because his cholesterol was said to be  markedly elevated from his previous.  He started eating differently and  lost about 15 pounds.  He has been doing walking for activity.  I am  looking at his follow-up and he has an excellent lipid profile now.  He  did have some medication changes.   He says with his activity he is not getting any chest discomfort, neck  or arm discomfort.  He is not having any palpitations, presyncope or  syncope.  He is having no PND or orthopnea.   PAST MEDICAL HISTORY:  1. Coronary artery disease, status post CABG in April 2005:  LIMA to      the LAD, SVG to PDA, SVG to ramus intermediate.  EF 46%.  2. History of atrial fibrillation in the past, treated with      cardioversion.  3. Postoperative anemia.  4. Right lower extremity cellulitis.  5. Degenerative joint disease, status post right total knee      replacement.  6. Hypertension.  7. Hyperlipidemia.   ALLERGIES:  PENICILLIN causes a rash, PRAVACHOL and NONSTEROIDALS cause  upset stomach.   MEDICATIONS:  1. Aspirin 162 mg daily.  2. Digitek 0.125 mg daily.  3. Azor 5/40 mg daily.  4. Crestor 10 mg daily.  5. Fish oil.   REVIEW OF SYSTEMS:  As stated in the HPI and otherwise negative for  other systems.   PHYSICAL EXAMINATION:  The patient is in no distress.  Blood pressure 140/78, heart rate 79 and regular, weight 188 pounds,  body mass index 28.  HEENT:  Eyelids unremarkable, pupils equal, round and react to light.  Fundi not visualized.  Oral  mucosa unremarkable.  NECK:  No jugular venous distention at 45 degrees, carotid upstroke  brisk and symmetrical.  No bruits, no thyromegaly.  LYMPHATIC:  No adenopathy.  LUNGS:  Clear to auscultation bilaterally.  BACK:  No costovertebral angle tenderness.  CHEST:  A well-healed sternotomy scar.  HEART:  PMI not displaced or sustained.  S1 and S2 within normal limits.  No S3, no S4, no click, no rubs, no murmurs.  ABDOMEN:  Obese, positive bowel sounds, normal in frequency and pitch.  No bruits, no rebound, no guarding, no midline pulsatile mass, no  organomegaly.  SKIN:  No rashes, no nodules.  EXTREMITIES:  2+ pulses, no edema.   EKG:  Sinus rhythm with premature atrial contractions, axis within  normal limits, intervals within normal limits, no acute ST-T wave  changes.   ASSESSMENT AND PLAN:  1. Coronary artery disease.  The  patient is having no symptoms related      to this.  No further cardiovascular testing is suggested.  He will      continue with aggressive risk reduction.  2. Hypertension.  Blood pressure is borderline today.  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  He will continue on the current      regimen.  3. Obesity.  He has lost weight, and I applaud this.  I encouraged      more of the same.  4. Dyslipidemia.  He is having this followed closely by Dr. Christell Constant.  5. Follow-up.  We will see the patient back in 12 months or sooner if      needed.     [name], MD, [facility]  Electronically Signed    JH/MedQ  DD: 07/27/2007  DT: 07/28/2007  Job #: [id number]   cc:   [name], M.D.

## 2011-02-27 NOTE — Op Note (Signed)
[name], [name]                  ACCOUNT NO.:  [id number]   MEDICAL RECORD NO.:  [id number]          PATIENT TYPE:  INP   LOCATION:  5733                         FACILITY:  [facility]   PHYSICIAN:  Gita Kudo, M.D. DATE OF BIRTH:  1931-11-14   DATE OF PROCEDURE:  09/06/2004  DATE OF DISCHARGE:                                 OPERATIVE REPORT   OPERATIVE PROCEDURE:  Appendectomy.   SURGEON:  Gita Kudo, M.D.   ANESTHESIA:  General.   PRE-OPERATIVE DIAGNOSIS:  Appendicitis.   POST-OPERATIVE DIAGNOSIS:  Appendicitis.   CLINICAL SUMMARY:  A 77 year old male with approximately an 18-hour history  of abdominal pain.  It localized to the right lower quadrant, associated  with some nausea but no vomiting and no abnormal BMs.  His CT scan was  abnormal and consistent with appendicitis.  White blood count 24,000.   OPERATIVE FINDINGS:  The appendix was thickened, acutely inflamed, and had a  central area of necrosis.  It had not perforated.   OPERATIVE PROCEDURE:  Under satisfactory general endotracheal anesthesia,  having received 2.0 grams of Cefotan, preop, the patient's abdomen was  prepped and draped in the standard fashion.  A transverse incision was made  in the right lower quadrant and carried into the peritoneal cavity.  Gentle  dissection used to deliver the appendix into the wound.  The mesoappendix  was clamped, cut and tied with #0 chromic, and the mesoappendix and  appendiceal vessel also ligated with 3-0 silk suture ligature.  The base of  the appendix was then doubly tied with chromic and amputated.  The mucosa  was cauterized, and then the stump inverted with a pursestring of 3-0 silk.  The operative site was hemostatic.  The abdomen lavaged with saline and the  return was clear.  Then after the sponge and needle counts were correct, the  abdomen closed in layers with running #0 PDS suture.  Then the skin edges  were approximated with Steri-Strips.  A total of  20 cc of 0.25% Marcaine  with epinephrine was infiltrated for postop analgesia.  A sterile absorbent  dressing was applied, and the patient went to the recovery room from the  operating room in good condition.       MRL/MEDQ  D:  09/06/2004  T:  09/07/2004  Job:  [id number]

## 2011-02-27 NOTE — Discharge Summary (Signed)
NAMEALFONZA, TOFT                  ACCOUNT NO.:  [id number]   MEDICAL RECORD NO.:  [id number]          PATIENT TYPE:  INP   LOCATION:  5733                         FACILITY:  [facility]   PHYSICIAN:  Gita Kudo, M.D. DATE OF BIRTH:  12-31-1931   DATE OF ADMISSION:  09/06/2004  DATE OF DISCHARGE:  09/09/2004                                 DISCHARGE SUMMARY   CHIEF COMPLAINT:  Abdominal pain.   HISTORY OF PRESENT ILLNESS:  This 77 year old male presented to the  emergency room with a several hour history of abdominal pain localizing to  the right lower quadrant.   LABORATORY STUDIES:  Pathology:  Acute appendicitis.   EKG interpreted as normal sinus rhythm, nonspecific ST abnormality.  Hemoglobin 14, hematocrit 41, white count 22,200. BMET normal. Urinalysis  negative.   X-RAYS:  CT scan, consistent with appendicitis.   HOSPITAL COURSE:  On the evening of admission, the patient underwent open  appendectomy with the findings of an acute appendicitis. Postoperatively, he  did quite well. He was comfortable, ambulatory, he regained bowel function.  He improved to the point where he was able to be discharge.   DISCHARGE DIET:  He was allowed home on a regular diet.   DISCHARGE INSTRUCTIONS:  Limited activity and wound care on November 29, his  third postop day.   DISCHARGE DIAGNOSIS:  Appendicitis.   OPERATIONS:  Appendectomy.   COMPLICATIONS:  Infections.   CONSULTATIONS:  None.   CONDITION ON DISCHARGE:  Good.       MRL/MEDQ  D:  10/24/2004  T:  10/24/2004  Job:  [id number]

## 2011-02-27 NOTE — Discharge Summary (Signed)
NAMEJIMIE, Greg [name]                            ACCOUNT NO.:  [id number]   MEDICAL RECORD NO.:  [id number]                   PATIENT TYPE:  INP   LOCATION:  0352                                 FACILITY:  [facility]   PHYSICIAN:  Greg [name], M.D.             DATE OF BIRTH:  [birth year]   DATE OF ADMISSION:  01/23/2004  DATE OF DISCHARGE:  01/25/2004                                 DISCHARGE SUMMARY   ANTICIPATED DATE OF DISCHARGE:  February 08, 2004.   CARDIOLOGIST:  Dr. Rollene [name].   ORTHOPEDIC SURGEON:  Greg [name].   PRIMARY CARE PHYSICIAN:  Dr. Rudi [name] of [facility] in [place]   ADMISSION DIAGNOSIS:  Degenerative arthritis, right knee.   DISCHARGE/SECONDARY DIAGNOSES:  1. Degenerative arthritis, right knee status post right total knee     arthroplasty.  2. Postoperative altered mental status with hypoxia, resolved after Narcan.  3. Postoperative mild congestive heart failure per chest x-ray, resolved     with diuresis.  4. Postoperative chest pain with abnormal electrocardiogram, status post     cardiac catheterization.  5. Severe left main/3-vessel coronary artery disease status post coronary     artery bypass grafting.  6. Postoperative renal insufficiency with [name] of 2.1.  Believed     secondary to postoperative hypotension, possible urinary obstruction, and     ACE inhibitor. Resolved after intravenous fluids, Foley catheter and     temporary holding of his ACE inhibitor.  7. Postoperative anemia status post blood transfusions.  8. History of chronic atrial fibrillation status post conversion.  On     chronic Coumadin therapy.  The patient also had postoperative atrial     fibrillation, however is currently in sinus rhythm on digoxin, amiodarone     and Metoprolol.  9. Postoperative leukocytosis with normal sputum and urinary cultures.     Status post 7-day course of empiric antibiotics with vancomycin  and     Maxipime.  His peak white blood count was 39,400 on February 01, 2004.  His     peak white blood count was 39.4 on February 01, 2004.  His white blood count     has been persistently trending down with his most recent white blood     count being 21.0 on February 07, 2004.  10.      Postoperative volume excess, improving on diuretic therapy.     Preoperative weight 190 pounds.  Weight on February 07, 2004 is 208 pounds.     Will continue diuretic therapy for 1 month after discharge then as     directed.  11.      Postoperative right leg cellulitis, resolving.  Status post     vancomycin and Maxipime.  12.      Elevated liver function enzymes, uncertain etiology. Peak SGOT of     227 on January 26, 2004.  His SGPT at that time was 38.  His last SGOT was     42 on February 07, 2004, and his SGPT was 85.  His baseline SGOT was 20 and     SGPT was 21 on January 17, 2004.  13.      History of hypertension.  14.      History of hiatal hernia.  15.      Hypercholesterolemia.  16.      History of colon polyps.  17.      History of left hand injury in 1953.  18.      Postoperative acute myocardial infarction with peak troponin of     51.79.   BRIEF HISTORY:  Greg [name] is [name] 77 year old Caucasian male who initially  presented to Greg [name] with symptoms of right knee pain for the  past several years that had been increasing with ambulation.  He does not  take nonsteroidal antiinflammatory medication due to his Coumadin use.  After examination of the patient Dr. Darrelyn [name] felt that due to the patient's  increasing pain he should be electively admitted to undergo right total knee  arthroplasty.   PROCEDURES:  1. On January 23, 2004 Greg [name] underwent [name] right total knee arthroplasty     utilizing the [employer]  system.  All 3 components were cemented and     vancomycin was used in the cement.  The size used as [name] size 9 right     posterior cruciate sacrificing femoral component, size 9 tibial tray with      [name] 10 mm thickness leg insert.  The patella was [name].     Surgeon was Greg [name].  2. On January 26, 2004 Greg [name] underwent cardiac catheterization by Dr. Loraine Leriche     [name].  During that procedure he also underwent direct current     cardioversion and intra-artery balloon pump insertion.  Catheterization     findings showed significantly increased right and left heart filling with     pressures with mild pulmonary hypertension.  Mild to moderate increase in     left ventricular systolic function with an ejection fraction estimated at     40% and calculated of 46%.  There was also severe left main and 3-vessel     coronary artery disease.  3. On January 26, 2004 Greg [name] underwent emergent median sternotomy for     coronary artery bypass grafting x4 using [name] left internal mammary artery     to the LAD, saphenous vein graft to the posterior descending, and     sequential saphenous vein graft to the ramus intermedius, and obtuse     marginal 1.  Surgeon was Greg [name].   CONSULTATIONS:  1. Pharmacy regarding Coumadin and heparin therapy.  2. Physical therapy.  3. Occupational therapy.  4. [facility] Hospitalist, Greg [name].  5. Greg [name].  6. IV team for Greg [name] line placement.   RADIOGRAPHS:  His most recent chest x-ray on February 03, 2004 showed stable  small pleural effusions with associated atelectasis or minimal infiltrates  in the lower lobes.   LABORATORY DATA:  On February 07, 2004 his sodium was 137, potassium 3.9, blood  glucose 119, BUN 16, creatinine 1.1, total bilirubin 2.2, alkaline  phosphatase 103, SGOT 42, SGPT 85, total protein 5.3, albumin 2.4, calcium  8.2.  White blood count 21.0 thousand, hemoglobin 8.0, hematocrit 24.0,  platelet count 674,000.  PT 16.2,  INR of 1.5.   HOSPITAL COURSE:  On January 23, 2004 Mr. Amparo was electively admitted to [facility] where he underwent [name] right total knee arthroplasty by   Greg [name].  Initially it was felt the patient was doing well,  however, the following day the patient did complain of chest pain  specifically in his epigastric region.  An EKG showed sinus tachycardia with  [name] rate of 117.  There was also ST depression in mid septal and lateral  leads.  The patient was also given [name] GI cocktail and the patient did provide  some relief of his pain.  At that time the patient had already been started  on IV heparin therapy until his Coumadin therapy was resumed and his INR  became therapeutic.  Additional management included consultation by the [facility]  Hospitalist, Dr. Nolon Rod Monguilod.  Greg [name] did see the patient  on January 25, 2004 and did feel that cardiology service should be consulted.  [facility] was consulted as the patient is [name] current patient of Dr.  Rollene [name].  Greg [name] also addressed several other issues as  discussed in his discharge/secondary diagnoses.  These included altered  mental status, renal insufficiency, postoperative anemia and congestive  heart failure.  Later that day the patient was evaluated by Dr.  Olga [name] who did feel that the patient's symptoms were consistent with  acute coronary syndrome/myocardial infarction.  Cardiac enzymes did show [name]  peak troponin level of 51.79.  His CK was elevated at 1784, and CK-MB was  elevated at 154.9.  He was then continued on heparin and started on aspirin  and Integrilin therapy.  Intravenous nitroglycerin was also started.  He was  also continued on diuretic therapy and beta blocker therapy.  He was then  scheduled for [name] cardiac catheterization which he underwent on January 26, 2004.  Results showed severe left main and 3-vessel coronary artery disease.  There was also significantly increased right and left heart filling  pressures with mild pulmonary hypertension.  Mild to moderate decrease in  left ventricular systolic function was also noted.  His  ejection fraction  was estimated at 40% and calculated at 46%.  At that time it was difficult  to give [name] precise quantification of left ventricular ejection fraction due  to his atrial fibrillation.  Based on these findings an intra-aortic ballon  pump and Swan Ganz catheter were left in place and Dr. Dorris Fetch from CVTS  was consulted for emergent coronary artery bypass grafting surgery.  After [name]  review of the patient's heart catheterization results and examination of the  patient, Dr. Dorris Fetch  then did agree that the patient should undergo emergent cardiac surgery.  He  was taken emergently to the operating room where he underwent coronary  artery bypass grafting x4.  There were no complications and the patient tolerated the procedure well.  Later than evening he remained intubated and sedated.  He did show  postoperative anemia and was transfused.  He also had some coagulopathy which was treated successfully with fresh frozen plasma and platelets.  The  following morning he remained sedated on [name] ventilator but was following  commands.  His blood pressure was stable on Neo-Synephrine. He was also on  an amiodarone drip for atrial fibrillation for his history of atrial  fibrillation, although he was currently in sinus rhythm.  At that time Dr.  Dorris Fetch felt it was appropriate to begin weaning him from his  intra-  aortic balloon pump.  By this time his coagulopathy had resolved and it was  felt that it was safe to resume Coumadin the following day.  His mediastinal  tube was also discontinued as it had minimal output.  Later that evening he  had been extubated and was neurologically intact.  He remained  hemodynamically stable.  Dr. Dorris Fetch also began weaning him from blood  pressure stabilizing agents.   On postoperative day 2  Greg [name] remained stable.  However he was now in  atrial fibrillation with [name] controlled rate in the 90's.  His weight was up  approximately 30  pounds from his baseline and he was started on diuretic  therapy.  His chest tubes were also discontinued later that day without  incident.  He was continued on amiodarone, Coumadin and Lovenox for his  atrial fibrillation.  He was also started on digoxin.  Over the next few  days Greg [name] progressed slowly.  He remained in atrial fibrillation and was  eventually changed to oral amiodarone.  He did require discontinuation of  his Lovenox due to an initial increased jump of his INR to 4.7.  This also  required holding of his Coumadin for several days.  In this time frame he  also showed [name] decrease in his hemoglobin and hematocrit to 7.8 and 23  respectively and he was transfused with packed red blood cells.  Otherwise  the patient remains hemodynamically stable.  He also began mobilizing and  was receiving CPM therapy for his right total knee arthroplasty.  The  patient was also tolerating an oral diet.  By postoperative day 4 he was  transferred out of the surgical intensive care unit onto the floor.   Over the next several days Greg [name] continued to progress.  He was noted to  convert from atrial fibrillation to sinus rhythm.  Of note, he was noted to  have an elevated white blood count of 39.4 thousand on February 01, 2004.  The  etiology was felt to be unclear.  Respiratory and urine cultures were done  which were both negative.  His chest x-ray was stable, showing improving  bibasilar aeration with small pleural effusions.  There was no pneumothorax.  At that time he did have [name] PIC line for IV access.  The nursing staff did  attempt reinsertion of [name] peripheral IV line, however they were unsuccessful  and the [facility] line was continued for use with administration of empiric  antibiotics of vancomycin and Maxipime which were started for his  leukocytosis.  At that time the patient did also report right leg tenderness which was mildly erythematous.  Dr. Dorris Fetch felt this was most likely   right leg cellulitis.   Over the next several days Greg [name] continued to have leukocytosis but with  consistent trending down of his white blood count.  However, the primary  source of infection was unclear.  Of note, the patient also had elevated  liver enzymes from his baseline.  The etiology was unclear.  The patient was  without abdominal pain or abdominal symptoms.  His liver enzymes were  monitored closely throughout the remaining course of his hospitalization and  were noted to be trending downward.   Throughout the remaining course of his hospitalization Greg [name] made  continued progress.  He maintained sinus rhythm.  His blood pressure  remained well controlled on his medication regimen.  He was also afebrile  and was saturating 95% on room  air.  His urine output was adequate and he  was urinating without difficulty.  His bowels were working appropriately.  He did show continued signs of volume excess but his weight was trending  down on diuretic therapy.  As stated before, both his white blood count and  liver function tests were elevated but were also trending down.  He did  complete [name] 7-day course of vancomycin and Maxipime.  His right leg  cellulitis was noted to be resolving.  His incisions were noted to be  healing well without signs or symptoms of infection.  He was mobilizing with  cardiac rehab and was making progress.  He did require use of [name] rolling  walker and this was arranged for home use.  His primary complaint was some  dyspnea on exertion and he did require frequent rest.  On postoperative day  11 the subacute unit was consulted regarding consideration of subacute care  after discharge.  Initially the rehab service did feel that he would gain  some benefit from [name] short stay in the subacute care unit.  However the  patient expressed desire to be discharged home instead.  The patient's wife  would be available for discharge care.  However it was felt that the  patient  should be discharged home with [name] Home Health nurse and physical therapy to  continue monitoring his progressive.  At that time his INR was  subtherapeutic at 1.5, however, it was felt since Greg [name] had maintained  sinus rhythm for several days that he would be appropriate for discharge  home the following day, February 08, 2004.  Official discharge orders to be  written during morning rounds pending no change of patient's status.  Home  Health nursing will assist with drawing appropriate lab work for his  Coumadin therapy and these results will be sent to Dr. Antoine Poche for dosing  adjustments if appropriate.   DISCHARGE MEDICATIONS:  1. Enteric coated aspirin 81 mg 1 p.o. daily.  2. Toprol XL 50 mg 1 p.o. daily.  3. Zestril 40 mg 1 p.o. daily.  4. Vytorin 10/40 mg 1 p.o. daily.  5. Amiodarone 200 mg 1 p.o. b.i.d.  6. Digoxin 0.125 mg 1 p.o. daily.  7. Asacol 400 mg 1 p.o. daily. 8. Omeprazole 20 mg 1 p.o. daily.  9. Lasix 40 mg 1 p.o. daily x1 month.  10.      K-Dur 20 mEq one p.o. daily x1 month.  11.      Coumadin - official Coumadin dose to be determined based on his INR     results on his date of discharge.  12.      Tylox 1-2 tablets p.o. q.4-6h p.r.n. pain.   ACTIVITY:  He is instructed to avoid driving or heavy lifting more than 10  pounds.  He is encouraged to continue his daily walking and breathing  exercises.  Home Health physical therapy will assist and monitor his  progress.   DIET:  He is to follow [name] low fat, low salt diet.   WOUND CARE:  He may shower daily and clean his wounds with mild soap and  water.  He is to notify the CVTS office if he develops fever greater than  101 or redness or drainage from his incision site.   SPECIAL INSTRUCTIONS:  Home Health nursing will be arranged to monitor his  progress and provide wound evaluation.  The Home Health nurse will also  assist in drawing his PT, INR lab  work.  These results are to be sent to Dr.  Jenene Slicker  office.   FOLLOW UP:  1. He is to follow up with Dr. Dorris Fetch at the CVTS office on Feb 25, 2004 at 11:30 [name].m.  He was instructed to bring his chest x-ray with him     for this appointment.   1. He is to follow up with Greg [name] in 2 weeks.  He is to call [phone] to schedule this appointment.   1. He is to follow up with Dr. Antoine Poche in approximately 2 weeks.  He will     have [name] chest x-ray taken at this appointment.  He is to call [facility] to schedule this follow up.  There office has been notified of     his discharge.      Jerold Coombe, P.[name].                  Ronald [name]. Darrelyn [name], M.D.    AWZ/MEDQ  D:  02/07/2004  T:  02/08/2004  Job:  [id number]   cc:   [name], M.D.  [address] [place]  [place] [address]  Fax: [phone]

## 2011-02-27 NOTE — Cardiovascular Report (Signed)
[name], DELDUCA                            ACCOUNT NO.:  [id number]   MEDICAL RECORD NO.:  [id number]                   PATIENT TYPE:  INP   LOCATION:  2307                                 FACILITY:  [facility]   PHYSICIAN:  Carole Binning, M.D. [facility]         DATE OF BIRTH:  [birth year]   DATE OF PROCEDURE:  01/26/2004  DATE OF DISCHARGE:                              CARDIAC CATHETERIZATION   PROCEDURE PERFORMED:  1. Direct current cardioversion.  2. Right and left heart catheterization with coronary angiography and left     ventriculography and abdominal aortography.  3. Intra-artery balloon pump insertion.   INDICATION:  [name] is a 77 year old male who underwent right total knee  replacement on January 23, 2004.  He presented yesterday with chest pain,  congestive heart failure and marked global ST segment depression on his EKG.  Cardiac markers were elevated consistent with an acute myocardial  infarction.  He was initially stabilized on medical therapy including  heparin and integrilin.  This morning, he developed atrial fibrillation with  a rapid ventricular response associated with hypotension.  He was started on  intravenous amiodarone.  He was brought emergently to the cardiac  catheterization laboratory.   CATHETERIZATION PROCEDURAL NOTE:  We initially opted to proceed with direct  current cardioversion because of the patient's hemodynamic instability with  atrial fibrillation.  He had been started on intravenous amiodarone.  General anesthesia was admitted under direction of anesthesia department.  The defibrillation paddles had been placed in the anterior posterior  position.  Synchronized direct current cardioversion was then performed with  the biphasic defibrillator.  A total of four shocks were delivered at 100,  150, 200 and 200 joules respectively.  With the last two shocks, sinus  rhythm was achieved momentarily.  However, the patient reverted back to  atrial  fibrillation and was unable to maintain sinus rhythm. We therefore  opted to discontinue further attempts at cardioversion and then proceeded  with heart catheterization.   Followed the cardioversion, we proceeded with cardiac catheterization.  An 8  French sheath was placed in the right femoral vein, 6 French sheath in the  right femoral artery.  Right heart catheterization was performed with a Swan-  Ganz catheter.  Coronary angiography was performed with standard Judkins 6  French catheters.  Left ventriculography was performed with an angled  pigtail catheter.  We also performed abdominal aortography to ensure that it  would be safe to place an intra-aortic balloon pump.  Contrast was  Omnipaque.  Following completion of the diagnostic catheterization, an 8  French 40 mL intra-aortic balloon pump was placed via the left femoral  artery.  The Swan-Ganz catheter was left in place for hemodynamic  monitoring.   There were no complications.   CATHETERIZATION RESULTS:   HEMODYNAMICS:  1. Right atrial mean pressure 19.  2. Right ventricular pressure 42/20.  3. Pulmonary artery pressure 42/30.  4. Pulmonary capillary wedge mean pressure 31.  5. Left ventricular pressure 80/28.  6. Aortic pressure 80/60.   The cardiac output measured by the Fick method is 3.5, cardiac index 1.9.   LEFT VENTRICULOGRAM:  There is moderate hypokinesis of the anterior apical  wall.  Ejection fraction is estimated at 40%, calculated at 46%.  It is  difficult ot give a precise quantification of left ventricular ejection  fraction due to atrial fibrillation.   Abdominal aortogram reveals minimal atherosclerotic disease in the distal  abdominal aorta.  The renal arteries and iliac arteries are normal.   CORONARY ARTERIOGRAPHY (CO-DOMINANT):  Left main has a distal 95% stenosis  which is hazy consistent with a probable ruptured plaque and possible  thrombus.   Left anterior descending artery has a 60%  stenosis in the mid vessel.  The  LAD gives rise to a single normal size diagonal branch.   Left circumflex is a large co-dominant vessel.  It gives rise to a small to  normal size ramus intermedius which is diffusely diseased.  The circumflex  gives rise to a large first obtuse marginal branch which has a 50% stenosis  proximally and an 80% stenosis in a more lateral branch of the obtuse  marginal.  The distal circumflex gives rise to a small first posterior  lateral branch and a normal size second posterior lateral branch and a small  posterior lateral branch.   Right coronary artery is a co-dominant vessel.  There is a diffuse 20%  stenosis in the proximal to mid vessel.  The distal right coronary artery  has a 95% stenosis proximal to the posterior descending artery.  The distal  right coronary artery ends as a normal size posterior descending artery.   IMPRESSION:  1. Significantly increased right and left heart filling pressures with mild     pulmonary hypertension.  2. Mild-to-moderate decrease in left ventricular systolic function.  3. Severe left main and three vessel coronary artery disease.   PLAN:  The intra-aortic balloon pump and Swan-Ganz catheter have been left  in place.  Dr. Dorris Fetch from cardiovascular surgery has been consulted  for emergent coronary artery bypass surgery.                                               Carole Binning, M.D. [facility]    MWP/MEDQ  D:  01/26/2004  T:  01/28/2004  Job:  [id number]   cc:   [name], M.D.  [address] [place]  [place] [address]  Fax: [phone]

## 2011-02-27 NOTE — Op Note (Signed)
NAMEJOAH, PATLAN A                            ACCOUNT NO.:  [id number]   MEDICAL RECORD NO.:  [id number]                   PATIENT TYPE:  INP   LOCATION:  2307                                 FACILITY:  [facility]   PHYSICIAN:  Salvatore Decent. Dorris Fetch, M.D.         DATE OF BIRTH:  [birth year]   DATE OF PROCEDURE:  01/26/2004  DATE OF DISCHARGE:                                 OPERATIVE REPORT   PREOPERATIVE DIAGNOSIS:  Left main and three vessel disease, status post MI.   POSTOPERATIVE DIAGNOSIS:  Left main and three vessel disease, status post  MI.   PROCEDURE:  Emergency median sternotomy, extracorporeal circulation,  coronary artery bypass grafting x4 (left internal mammary artery to LAD,  saphenous vein graft to posterior descending, sequential saphenous vein  graft to ramus intermedius, and obtuse marginal 1).   SURGEON:  Salvatore Decent. Dorris Fetch, M.D.   ASSISTANT:  Coral Ceo, P.A.   ANESTHESIA:  General.   FINDINGS:  Vein from left thigh adequate quality for one graft, vein from  right thigh better quality to use for the sequential graft.  Mammary good  quality.  Left ventricular hypertrophy.  Coagulopathic bleeding at the  completion of the procedure.   INDICATIONS FOR PROCEDURE:  The patient is a 77 year old gentleman who has a  history of atrial fibrillation, but no previous history of coronary artery  disease.  He underwent right total knee replacement on April 13.  On April  15, he developed chest pain and EKG changes.  He was treated with aspirin,  heparin, and Integrilin and was stabilized.  He was transferred to [facility]  On the morning of January 26, 2004, he was taken to the  catheterization laboratory where he was found to have a critical left main  stenosis which appeared hazy with a 95% stenosis.  He also had significant  disease in his LAD, circumflex, and right coronary artery.  I was asked to  see the patient for evaluation for emergent coronary  artery bypass grafting  given the critical nature of his left main disease and despite his multiple  high risk factors, the patient was felt to be a candidate for emergency  coronary artery bypass grafting.  The indications, risks, benefits, and  alternatives were discussed with the patient and his family independently,  they all understood and accepted the risks, and agreed to proceed.   DESCRIPTION OF PROCEDURE:  The patient was brought directly from the  catheterization laboratory to the operating room.  Lines were placed by  anesthesia service to monitor arterial, central venous, and pulmonary  arterial pressure.  Intravenous antibiotics were administered.  The chest,  abdomen, and legs were prepped and draped in the usual sterile fashion.   A median sternotomy was performed and the left internal mammary artery was  harvested using standard technique.  Simultaneously, an incision was made in  the medial aspect of the left  leg at the level of the knee.  The saphenous  vein was harvested endoscopically.  It became very small in the distal end  of the thigh so that it was not adequate conduit for all of the grafts.  Therefore, additional vein was harvested from the right thigh  endoscopically.  The patient was fully heparinized prior to dividing the  distal end of the mammary artery, there was excellent flow through the cut  end of the vessel.  The patient was stable without evidence of significant  ischemic during the takedown of the mammary artery.   The pericardium was opened.  The ascending aorta was inspected and palpated.  The aorta was cannulated via concentric 2-0 Ethibond pledgeted pursestring  sutures.  A dual stage venous cannula was placed via pursestring suture in  the right atrial appendage.  Cardiopulmonary bypass was instituted and the  patient was cooled to 32 degrees Celsius.  The coronary arteries were  inspected and anastomotic sites were chosen.  The conduits were  inspected  and cut to length.  A retrograde cardioplegia cannula was placed in the  coronary sinus via pursestring suture in the right atrium.  Antegrade  cardioplegia cannula was placed in the ascending aorta.   The aorta was crossclamped, the left ventricle was emptied via the aortic  root vent, cardiac arrest then was achieved with a combination of cold  antegrade blood cardioplegia and after 500 mL of antegrade blood  cardioplegia, there was complete diastolic arrest and myocardial septal  temperature was 17 degrees Celsius.  Additional cardioplegia was  administered via the retrograde catheter to achieve a myocardial septal  temperature of 11 degrees Celsius.  Topical iced saline also was used.   The following distal anastomoses then were removed.  First a reversed  saphenous vein graft was placed sequentially to the ramus intermedius and  obtuse marginal 1 branch of the left circumflex .  The ramus intermedius was  a 1.5 mm vessel that was intramyocardial.  It was good quality at the site  of the anastomosis.  A side-to-side anastomosis was performed about the side  branch of the vein graft.  The anastomosis was probed proximally and  distally and there was good flow through the graft through this anastomosis.  The distal end then was cut to length and anastomosed end-to-side to obtuse  marginal 1.  It was a 1.5 mm good quality target as well.  Again this  anastomosis was performed with a running 7-0 Prolene suture.  Probe passed  easily in both directions.  Cardioplegia was administered.  There was good  hemostasis and good flow through the graft.   Next a reversed saphenous vein graft was placed end-to-side to the posterior  descending.  The posterior descending was a 1.5 mm good quality vessel that  had a tight proximal stenosis.  The vein graft was of good quality.  The  anastomosis was performed with a running 7-0 Prolene suture.  There was excellent flow through the graft.   Cardioplegia was administered and there  was good hemostasis.   Next, the left internal mammary artery was brought through the window in the  pericardium.  It was a 2 mm good quality conduit.  It was anastomosed end-to-  side to the mid LAD which was superficially intramyocardial.  It was a 1.8  mm good quality target.  The anastomosis was performed end-to-side with a  running 8-0 Prolene suture.  At the completion of the mammary to LAD  anastomosis, the Bulldog clamp was briefly removed and inspected for  hemostasis.  Immediate and rapid septal rewarming was noted.  The Bulldog  clamp was replaced.  The mammary pedicle was tacked to the epicardial  surface of the heart with 6-0 Prolene sutures.  Additional cardioplegia was  administered down the vein grafts and the retrograde cannula.  The vein  grafts were cut to length.  Proximal vein graft anastomoses were then  performed to 4.0 mm punch aortotomies with running 6-0 Prolene sutures.  AT  the completion of the final proximal anastomoses the patient was given a  dose of warm retrograde cardioplegia.  The aortic root was deaired.  The  Bulldog clamp then was removed from the mammary artery.  Balloon was  deflated on the retrograde cardioplegia cannula and it was removed.  The  aortic crossclamp was removed.  The total crossclamp time was 60 minutes.  The patient fibrillated and required a single defibrillation with 20 joules.   The patient then was in sinus rhythm thereafter, he was maintained on an  amiodarone drip.  While the patient was being rewarmed, all proximal and  distal anastomoses were inspected for hemostasis.  Epicardial pacing wires  were placed on the right ventricle and right atrium.  The patient was  initially DDD paced, but subsequently weaned into sinus rhythm with an  adequate rate.  When he had been rewarmed to a core temperature of 37  degrees Celsius and Dopamine drip was initiated, the intra-aortic balloon  pump  was started back at 1 to 1 and the patient was weaned from  cardiopulmonary bypass without difficulty.  The total bypass time was 106  minutes.  The initial cardiac index was greater than 2 liters per minute per  meter square and the patient remained hemodynamically stable throughout the  post bypass period.   A test dose of Protamine was administered and was well tolerated.  The  atrial and aortic cannuli were removed.  The remainder of the Protamine was  administered without incident.  The chest was irrigated with 1 liter of warm  normal saline containing 1 gram of Vancomycin.  Hemostasis was achieved.  Platelets and plasma were administered for coagulopathic bleeding.  The left  pleural and two mediastinal chest tubes were placed through septal subcostal  incisions.  The pericardium could not be reapproximated.  The mediastinal  fat was closed over the heart to prevent adhesions to the sternum.  The sternum was closed with heavy gauge interrupted stainless steel wires.  The  remainder of the incisions were closed in the standard fashion.  All needle,  sponge, and instrument counts correct at the end of the procedure.  There  were no intraoperative complications.  The patient was taken from the  operating room to the surgical intensive care unit in critical, but stable  condition.                                               Salvatore Decent Dorris Fetch, M.D.    SCH/MEDQ  D:  01/26/2004  T:  01/28/2004  Job:  [id number]   cc:   Olga Millers, M.D. [facility]   Ronald A. Darrelyn Hillock, M.D.  [facility]  [address]  [address]  [place]  [place] [address]  Fax: [phone]

## 2011-02-27 NOTE — H&P (Signed)
[name], [name]                  ACCOUNT NO.:  [id number]   MEDICAL RECORD NO.:  [id number]          PATIENT TYPE:  INP   LOCATION:  1823                         FACILITY:  [facility]   PHYSICIAN:  Gita Kudo, M.D. DATE OF BIRTH:  Jul 28, 1932   DATE OF ADMISSION:  09/06/2004  DATE OF DISCHARGE:                                HISTORY & PHYSICAL   CHIEF COMPLAINT:  Abdominal pain.   HISTORY OF PRESENT ILLNESS:  This 77 year old man comes in with an  approximate 18 hour history of abdominal pain.  He felt abdominal cramping  early this morning, after having a BM.  Since then the pain was moderately  severe, localized in his right lower quadrant.  He has had no nausea or  vomiting.  He denies any prior episodes of anything like this.   PAST MEDICAL HISTORY:  Significant in that he has had knee replacement  surgery approximately six months ago, complicated by an MI requiring CABG x  4.   He continues to take multiple medications including Aspirin, Zestril,  Toprol, Asacol, Vytorin, and Digitek.   He has no limitation of his general activity.   REVIEW OF SYSTEMS:  Shows that he has no significant cardiac, pulmonary,  musculoskeletal symptoms - except as above.   SOCIAL HISTORY:  He does not drink alcohol.  He does not smoke.   PHYSICAL EXAMINATION:  GENERAL:  Alert, comfortable male sitting quietly in  the room with his wife.  VITAL SIGNS:  Showed him to be afebrile, pulse 78.  HEAD:  Normal.  No evidence of any jaundice.  NECK:  Supple.  No masses.  HEART:  Regular.  I hear no murmur.  CHEST:  Well healed sternotomy incision.  ABDOMEN:  Flat.  No masses.  He is moderately tender in the right lower  quadrant.  He does not have direct or indirect tenderness.  GENITALIA:  Normal.  There is no hernia.  RECTAL:  Shows a large prostate.  He is not tender on deep palpation.  EXTREMITIES:  There is no deformity.  Recent right knee surgery.  He has  good pulses and no edema.   DIAGNOSTIC STUDIES:  Include an elevated white count.  CT scan is abnormal.  There is evidence of appendicitis.  He has a large appendix extending  medially and there is a fair amount of stranding.  I have reviewed them with  the radiologist.   DIAGNOSIS/PLAN:  I feel he does have appendicitis.  I think we should  proceed with an appendectomy this evening and discuss the situation with the  patient and his wife.  We will start IV cefotetan.  They understand and  consent.       MRL/MEDQ  D:  09/06/2004  T:  09/06/2004  Job:  [id number]

## 2011-02-27 NOTE — Consult Note (Signed)
NAMEMONTEE, TALLMAN A                            ACCOUNT NO.:  [id number]   MEDICAL RECORD NO.:  [id number]                   PATIENT TYPE:  INP   LOCATION:  0455                                 FACILITY:  [facility]   PHYSICIAN:  Olga Millers, M.D. [facility]            DATE OF BIRTH:  05/12/1932   DATE OF CONSULTATION:  01/25/2004  DATE OF DISCHARGE:                                   CONSULTATION   REASON FOR CONSULTATION:  Mr. Rinker is a 77 year old male with a past medical  history of hypertension, hyperlipidemia, gastroesophageal reflux disease,  atrial fibrillation, who we were asked to evaluate for chest pain and  electrocardiogram changes.   HISTORY OF PRESENT ILLNESS:  The patient has a prior cardioversion for  atrial fibrillation, but no other history of cardiac disease.  He has not  had a Cardiolite or catheterization in the past.  He does have a history of  chest pain with exertion relieved with rest.  He has noticed this for the  past 2 years.  It has been stable, and does not occur at rest.  He did have  knee replacement on January 23, 2004.  He did well initially, but did have an  episode of chest pain last evening that was described as a heaviness.  It is  identical to his symptoms that occur with exertion.  It was in a substernal  location, and there was no radiation.  There was shortness of breath, but  there was no nausea or vomiting.  There was diaphoresis.  An  electrocardiogram at that time showed a sinus tachycardia with marked  anterolateral ST depression, consistent with ischemia.  He subsequently  developed some degree of hypoxia that improved with oxygen.  His systolic  blood pressure decreased to the 70-80 range.  His pain did resolve after  approximately 30 minutes.  He had recurrent symptoms this morning with  pulling on his traction.  Cardiology has been asked to evaluate.  At  present, the patient does complain of shortness of breath, but there is no  chest pain.   ALLERGIES:  PENICILLIN.   PAST MEDICAL HISTORY:  Significant for hypertension and hyperlipidemia, but  there is no diabetes mellitus.  He has a history of gastroesophageal reflux  disease.  He does have a history of atrial fibrillation, status post  cardioversion, and is on Coumadin therapy for that.  He had recent knee  surgery.   SOCIAL HISTORY:  He does not smoke, and has not since 1965.  He does not  consume alcohol.   FAMILY HISTORY:  Positive for a CVA in his mother.   REVIEW OF SYSTEMS:  There are no headaches or fevers or chills.  There is no  productive cough or hemoptysis.  There is no odynophagia, but there is mild  dysphagia for the past several days.  There is no melena or hematochezia.  There is  no dysuria, hematuria, rash, or seizure activity.  He does complain  of shortness of breath, but there is no increased pedal edema.  He has also  had knee pain since his recent surgery.  The remaining systems are negative.   PHYSICAL EXAMINATION:  VITAL SIGNS:  Blood pressure of 104/60.  His pulse is  in the 110 range.  GENERAL:  He is well-developed, well-nourished, in mild respiratory  distress.  He is mildly diaphoretic at the time of examination.  He does not  appear to be depressed, and there is no peripheral clubbing.  HEENT:  Unremarkable with __________.  NECK:  Supple with normal __________ bilaterally, and no bruits noted.  I  cannot appreciate jugular venous distention, but his exam is difficult.  CHEST:  Basilar rales.  CARDIOVASCULAR:  A tachycardic rate, but a regular rhythm.  I could not  appreciate murmurs, rubs, or gallops.  ABDOMEN:  Nontender, nondistended.  Positive bowel sounds.  No  hepatosplenomegaly.  No masses appreciated.  There is no abdominal bruit.  He has 2+ femoral pulses bilaterally, and no bruits.  EXTREMITIES:  He is status post right knee replacement, but there is no  peripheral edema.  He has 2+ dorsalis pedis pulses bilateral.  NEUROLOGIC:   Grossly intact.   His electrocardiogram now from today shows sinus tachycardia with marked  anterior ST depression.  His chest x-ray from today is read as interstitial  edema superimposed on chronic changes.  His BUN and creatinine are elevated  at 26 and 2.1, and I do not have a baseline value.  His hemoglobin has  dropped from 13.6 to 9.8, and his INR is 1.4.   DIAGNOSES:  1. Unstable angina/myocardial infarction.  2. Hypertension.  3. Hyperlipidemia.  4. History of atrial fibrillation.  5. History of gastroesophageal reflux disease.   PLAN:  Mr. Sanagustin has had symptoms prior to admission of angina with exertion.  He had recurrent symptoms last evening following his surgery that are  consistent with an acute coronary syndrome/myocardial infarction.  We will  cycle enzymes including CK-MB's and troponin I.  We will treat with aspirin,  heparin, and Integrilin (he will need to be renally dosed, as he does have  mild renal insufficiency).  I will discontinue his Toprol, and we will start  with lower dose Lopressor, has his blood pressure has been borderline.  We  will also add IV nitroglycerin.  He needs general diuresis, as there is  pulmonary edema on exam.  He will need to have his renal function followed  closely.  We will also continue with his Vytorin.  The risks and benefits of  cardiac catheterization have been discussed with he and his wife, and we  will plan to proceed on Monday, or sooner if he becomes worse.  We will need  to follow his renal function and make a decision about proceeding.  He will  need sodium bicarbonate prior to the catheterization, and no V-gram.  We  will check an echocardiogram for left ventricular function.  If it is  decreased, then he will require an ACE inhibitor if his renal function will  allow.  The orthopedic surgeons are following his need.                                              Olga Millers, M.D. [facility]  BC/MEDQ  D:  01/25/2004   T:  01/25/2004  Job:  [id number]

## 2011-02-27 NOTE — Discharge Summary (Signed)
NAMEDORMAN, CALDERWOOD                  ACCOUNT NO.:  [id number]   MEDICAL RECORD NO.:  [id number]          PATIENT TYPE:  INP   LOCATION:  1421                         FACILITY:  [facility]   PHYSICIAN:  Georges Lynch. Gioffre, M.D.DATE OF BIRTH:  1932-02-27   DATE OF ADMISSION:  03/20/2008  DATE OF DISCHARGE:  03/26/2008                               DISCHARGE SUMMARY   ADMISSION DIAGNOSIS:  1. End-stage osteoarthritis right hip with advanced osteoarthritis      left hip.  2. History of hypertension.  3. History of myocardial infarction with bypass in 2005.  4. History of irregular heart rhythm.  5. Reflux disease.  6. Upper dentures.  7. History of gastric ulcer.   DISCHARGE DIAGNOSES:  1. Right total hip arthroplasty.  2. Expected postoperative blood loss.  3. Postoperative irregular heart rhythm between atrial tachycardia and      atrial fibrillation, evaluated and treated by cardiology services      with medication changes.  4. History of hypertension.  5. History of myocardial infarction with bypass in 2005.  6. Reflux disease.  7. Hypertension.  8. History of gastric ulcer.   HISTORY OF PRESENT ILLNESS:  The patient is a 77 year old gentleman,  patient of Dr. [name] with painful range of motion and  ambulation with his right hip.  His x-rays revealed severe arthritis of  the hip.  The patient has elected proceed with a right total hip  arthroplasty.  He also has early arthritic changes of his left hip.   MEDICATIONS ON ADMISSION:  1. Aspirin 81 mg a day.  2. Amlodipine 5 mg a day.  3. Benazepril 40 mg a day.  4. Crestor 20 mg a day.  5. Digitek 0.125 mg a day.   ALLERGIES:  PENICILLIN.   SURGICAL PROCEDURES:  On March 20, 2008, the patient was taken to the OR  by Dr. Worthy Rancher, assisted by Oneida Alar PA-C and Dr. Lajoyce Corners.  The  patient under general anesthesia had a right total hip arthroplasty  without any complications.  The patient tolerated the procedure  well.  The patient was transferred to the recovery room and then to orthopedic  floor in good condition to follow total hip protocol.  The patient had  the following components implanted; Uni Femoral ASR implant of size 47,  a size 5 femoral stem, size 2+ femoral adapter, a size 54 ASR cup.   CONSULTS:  The following routine consults were requested; physical  therapy, case management, pharmacy for DVT prophylaxis.   A [place] cardiology consult was requested for evaluation of the  patient's postoperative tachycardia.   HOSPITAL COURSE:  On March 20, 2008, the patient was admitted to [facility] under the care of Dr. [name].  The patient had a  right total hip arthroplasty without any complications.  The patient was  transferred to recovery room and then to orthopedic floor in good  condition.  The patient did have some tachycardia, so it was recommended  that he be transferred to the telemetry floor for just routine care and  follow-up with cardiology.  From the orthopedic standpoint, the  patient's right total hip arthroplasty did very well throughout his  hospitalization without any problems.  His wound remained benign for any  signs infection.  His leg remained neuromotor vascularly intact, and the  patient did work well with physical therapy when he was able to under  other restrictions from the cardiac standpoint.   From the cardiac standpoint, the patient had some tachycardia  intraoperatively and postoperatively and was recommended for the  monitored floor.  He was transferred to monitored floor, and he had no  chest pains and no shortness of breath.  He was evaluated by cardiology  services, who felt that he was able to come off the telemetry floor due  to he was progressing well.  About a day or so later, the patient was  checked and found to have a rapid heart rate.  It was checked and  initially felt it was possibly an atrial tachycardia.  At that time, he   was also in A fib.  Cardiology evaluated the patient and was treating  the patient.  He denied any chest pains, no shortness of breath.  No  other ill effects.  Cardiology adjusted his medications on a couple of  times to bring his heart rate under control.  The patient's heart rate  did come into acceptable levels, and his medications were readjusted.  The patient was felt to be stable from a cardiac and an orthopedic  standpoint, and he was discharged home with routine follow-up with  orthopedics and cardiologist on an outpatient basis.   LABORATORY DATA:  CBC on admission found WBCs of 15.7, etiology unknown.  He has always run a little elevated according to his primary care  physician.  Hemoglobin 14.8, hematocrit 43.6, platelets 214.  His  hemoglobin slowly drifted down to 10.6/31.2.  The patient was pretty  much asymptomatic from that standpoint.  His INR was 2.9 on the date of  discharge and pharmacy was adjusting his Coumadin levels.  Routine  chemistries throughout his hospitalization were within normal limits  with his glucose labile between 107-139.  Estimated GFR was greater than  60.  Initially postop cardiac markers found CK 372, CK-MB 2.6, relative  index 0.7, troponin-I at 0.02.  Digoxin level 0.3.  Urinalysis on  admission was normal.  EKG on March 22, 2008, showed poor data quality,  but interpreted as sinus rhythm with frequent and consecutive premature  ventricular complexes with fusion complexes at 94 beats per minute.  Multiple EKG strips were also monitored on the floor with  interpretations of A fib and PVCs.   DISCHARGE INSTRUCTIONS:   DIET:  No restrictions.   ACTIVITY:  1. The patient is to slowly increase activity with use of a walker.  2. The patient was made 50% weightbearing on his right lower      extremity.   WOUND CARE:  The patient is to change his dressing daily.   FOLLOW UP:  1. The patient needs a follow-up appointment in 2 weeks from the  date      of surgery with Dr. Darrelyn Hillock in his office.  Please call [phone]      for that follow-up appointment.  2. Follow-up with Dr. Antoine Poche in his office on April 11, 2008, at      3:15.  Office number is [phone].   MEDICATIONS:  1. Percocet 10/650 one tablet every 4-6 hours for pain if needed.  2. Robaxin 500  mg 1 tablet every 6 hours for muscle spasms if needed.  3. Coumadin 5 mg once a day unless changed by Genevieve Norlander pharmacist.  4. The patient is to stop the amlodipine.  5. Cardizem 120 mg 1 tablet a day.  6. Metoprolol 50 mg 1 tablet twice a day.  7. Aspirin 81 mg 1 tablet twice a day on hold until he completes      Coumadin.  8. Digitek 0.125 mg a day.  9. Crestor 10 mg a day.  10.Once again, benazepril and amlodipine to be placed on hold.  11.Fish oil 1 tablet a day.   The patient's condition upon discharge to home is listed improved and  good.      Jamelle Rushing, P.A.    ______________________________  Georges Lynch Darrelyn Hillock, M.D.    RWK/MEDQ  D:  04/18/2008  T:  04/18/2008  Job:  [id number]   cc:   Windy Fast A. Darrelyn Hillock, M.D.  Fax: [phone]   [name], MD, [facility]  [address]  [address]  [place]  [place] [address]

## 2011-02-27 NOTE — Op Note (Signed)
NAME:  [name], [name]                            ACCOUNT NO.:  [id number]   MEDICAL RECORD NO.:  [id number]                   PATIENT TYPE:  AMB   LOCATION:  DAY                                  FACILITY:  [facility]   PHYSICIAN:  Lionel December, M.D.                 DATE OF BIRTH:  [birth year]   DATE OF PROCEDURE:  11/01/2003  DATE OF DISCHARGE:                                 OPERATIVE REPORT   PROCEDURE:  Esophagogastroduodenoscopy followed by total colonoscopy.   ENDOSCOPIST:  Lionel December, M.D.   INDICATIONS:  Greg is [name] 77 year old Caucasian male who is returning for  surveillance colonoscopy.  He has 20 year history of ulcerative colitis and  has remained in remission. He also has been having noncardiac chest pain.  Dr. Christell Constant also requested evaluation of his upper GI tract.  He has  heartburn, but this is infrequent and he denies dysphagia since he had his  esophageal ring dilated several years ago  The procedure and risks were  reviewed with the patient and informed consent was obtained.   PREOPERATIVE MEDICATIONS:  Cetacaine spray for oropharyngeal topical  anesthesia, Demerol 25 mg IV and Versed 4 mg.   FINDINGS:  Procedure performed in endoscopy suite.  The patient's vital  signs and O2 saturation were monitored during the procedure and remained  stable.  The patient was placed in the left lateral recumbent position and  remained stable.   PROCEDURE #1: ESOPHAGOGASTRODUODENOSCOPY:  The patient was placed in the  left lateral recumbent position and Olympus videoscope was passed via the  oropharynx without any difficulty into the esophagus.   ESOPHAGUS:  Mucosa of the esophagus was normal.  The squamocolumnar junction  is at 34 cm.  He had small-to-moderate size sliding hiatal hernia.  Diaphragmatic hiatus was at 40 cm from the incisors and was wide open.  However, the mucosa of the herniated part of the stomach was normal.  He did  have [name] tiny focal area of erythema just  proximal to the GE junction.   STOMACH:  It was empty and distended very well with insufflation.  The folds  of the proximal stomach were normal.  Examination of the mucosa revealed 2  prepyloric erosions, but there were no ulcers noted.  Angularis, fundus, and  cardia were examined by retroflexing the scope and were normal.  Hernia was  easily seen on this view.   DUODENUM:  Examination of the bulb and second part of the duodenum was  normal. The folds in the postbulbar duodenum were also normal.   Endoscope was withdrawn and the patient was prepared for procedure #2.   COLONOSCOPY:  Rectal examination was performed.  No abnormality noted on  external or digital exam.   The Olympus videoscope was placed in the rectum and advanced under vision  into the sigmoid colon and beyond.  Preparation was satisfactory.  Scope was  passed to the cecum which was identified by appendiceal orifice and the  ileocecal valve.  Pictures were taken for the record. As the scope was  withdrawn the colonic mucosa was, once again, carefully examined.  Random  biopsies were taken from the cecum and ascending colon, transverse colon,  descending and sigmoid and finally from the rectum; and submitted in 5  different containers.  Scattered diverticula were noted at sigmoid colon.  Rectal mucosa was normal.   The scope was retroflexed to examine anorectal junction and small  hemorrhoids were noted below the dentate line. The endoscope was  straightened and withdrawn.  The patient tolerated the procedure well.   FINAL DIAGNOSES:  1. Small-to-moderate size sliding hiatal hernia with mild changes of reflux     esophagitis limited to gastroesophageal junction.  This may or may not     have anything to do with his noncardiac chest pain.  2. Erosive antral gastritis.  3. Ulcerative colitis (UC) remains in remission.  4. Sigmoid colon diverticulosis.  Random biopsies taken from various     segments of colon  looking for dysplasia.  5. Small external hemorrhoids.   RECOMMENDATIONS:  1. Will try him on antireflux measures and omeprazole 20 mg p.o. b.i.d. I     would like for him to stay on it for 8 weeks. Thereafter he can reduce     the dose to 20 mg p.o. q.[name].m.  He can resume his Coumadin today.  2. High fiber diet.  3. I will be contacting the patient with results of blood tests and biopsy.      ___________________________________________                                            Lionel December, M.D.   NR/MEDQ  D:  11/01/2003  T:  11/01/2003  Job:  [id number]   cc:   [name], M.D.  [address] [place]  [place] [address]  Fax: [phone]

## 2011-02-27 NOTE — H&P (Signed)
NAMEASCENCION, COYE A                            ACCOUNT NO.:  [id number]   MEDICAL RECORD NO.:  [id number]                   PATIENT TYPE:  INP   LOCATION:  NA                                   FACILITY:  [facility]   PHYSICIAN:  Ronald A. Gioffre, M.D.             DATE OF BIRTH:  [birth year]   DATE OF ADMISSION:  01/23/2004  DATE OF DISCHARGE:                                HISTORY & PHYSICAL   HISTORY:  The patient has had right knee pain for the past several years  that has been increasing with ambulation.  He does not take non-steroidal  anti-inflammatory medications due to his Coumadin use.  Due to the patient's  increasing pain, he will proceed with a right total knee arthroplasty.   ALLERGIES:  PENICILLIN causes a rash.   PAST SURGICAL HISTORY:  Negative.   CURRENT MEDICATIONS:  1. Zestril 40 mg daily.  2. ____________ 20 mg daily.  3. Vytorin 10/40 mg daily.  4. Asacol 400 mg daily.  5. Coumadin 5 mg daily.  6. Toprol XL 50 mg daily.   PAST MEDICAL HISTORY:  1. Hypertension.  2. Hiatal hernia.  3. Hypercholesterolemia.  4. Atrial fibrillation requiring cardioversion.   FAMILY HISTORY:  Brother had lung cancer.   REVIEW OF SYSTEMS:  GENERAL:  Denies weight change, fever, chills, fatigue.  HEENT:  Denies headache, visual changes, tinnitus, hearing loss, sore  throat.  CARDIOVASCULAR:  Denies chest pain, palpitations, shortness of  breath, orthopnea.  RESPIRATORY:  Denies dyspnea, wheezing, cough, sputum  production, hemoptysis.  GENITOURINARY:  Denies dysuria, frequency, urgency,  hematuria.  ENDOCRINE:  Denies polyuria, polydipsia, appetite change, heat  or cold intolerance.  MUSCULOSKELETAL:  The patient does have right knee  pain.  NEUROLOGIC:  Denies dizziness, vertigo, syncope, seizures.  SKIN:  Denies itching, rashes, masses, or moles.   PHYSICAL EXAMINATION:  VITAL SIGNS:  Temperature 97.7, pulse 68,  respirations 18, blood pressure 160/100 right arm sitting.  The  patient has  not taken his blood pressure medication today.  GENERAL:  A 77 year old male in no acute distress.  HEENT:  PERRL.  EOMs intact.  Pharynx clear.  TMs intact.  NECK:  Supple without masses.  CHEST:  Clear to auscultation bilaterally.  No wheezes, rhonchi, or rales  noted.  HEART:  Regular rate and rhythm without murmur.  ABDOMEN:  Positive bowel sounds, soft, nontender.  No organomegaly or  abnormal masses.  EXTREMITIES:  Examination of his right knee reveals decreased range of  motion, painful range of motion.  He has had a genu varus deformity.  SKIN:  Warm and dry.   LABORATORY DATA:  X-ray of his right knee shows complete collapse of the  medial joint.   IMPRESSION:  Degenerative arthritis, right knee.   PLAN:  The patient is to be admitted to [facility] to undergo a  right total knee arthroplasty on  January 23, 2004.     Ebbie Ridge. Paitsel, P.A.                     Ronald A. Darrelyn Hillock, M.D.    Tilden Dome  D:  01/22/2004  T:  01/22/2004  Job:  [id number]

## 2011-02-27 NOTE — Op Note (Signed)
NAMEVIDYUTH, BELSITO A                            ACCOUNT NO.:  [id number]   MEDICAL RECORD NO.:  [id number]                   PATIENT TYPE:  INP   LOCATION:  NA                                   [facility]ACILITY[facility]   PHYSICIAN:  Ronald A. Gioffre, M.D.             DATE OF BIRTH:  1932/02/14   DATE OF PROCEDURE:  01/23/2004  DATE OF DISCHARGE:                                 OPERATIVE REPORT   PREOPERATIVE DIAGNOSES:  Severe degenerative arthritis of the right knee.   POSTOPERATIVE DIAGNOSES:  Severe degenerative arthritis of the right knee.   OPERATION:  Right total knee arthroplasty utilizing the [employer] system,  all three components were cemented and vancomycin was used in the cement.  The sizes used was a size 9 right posterior cruciate sacrificing femoral  component, size 9 tibial tray with a 10 mm thickness flex insert. The  patella was a size 26 patella.   DESCRIPTION OF PROCEDURE:  Under general anesthesia, routine orthopedic prep  and draping of the right lower extremity was carried out. The leg was  exsanguinated with esmarch, tourniquet was elevated to 350 mmHg. An incision  was made over the anterior aspect of the right leg, bleeders identified and  cauterized. Two flaps are created. At this time, I then carried out a median  parapatellar incision. I reflected the patella laterally, flexed the knee  and did medial and lateral meniscectomies and excised the anterior and  posterior cruciate ligaments. I also did a medial release and a posterior  medial release as well because the knee was quite contracted because of the  varus deformity. Once this was done, we then made our initial drill cut in  the proximal femur and we did do a synovectomy as well.  After that, we then  went on and inserted our #1 jig and removed 10 mm thickness off the distal  aspect of the femur. Following this, we inserted our #2 jig for a size 9  right posterior cruciate sacrificing femoral component and  carried out our  anterior, posterior and chamfering cuts.  Once this was done, we then went  on and prepared the tibia. We removed 4 mm thickness off the medial  articular surface of the tibial plateau. We did this by utilizing  intramedullary guide. Once this was completed, we then cut out patellar  groove cut and our notch cut for the size 9 right posterior cruciate  sacrificing component.  After all the cuts were made, we then went through  our trial prostheses and we had a nice secure fit with a 10 mm thickness  tray. We then cut out patella, removed 10 mm thickness off the articular  surface of the patella. Three drill holes were made in the patella for a  size 26 patella.  We then flexed the knee after removing all the components,  flexed the knee and then cut  our keel cut out of the proximal tibia.  Once  this was carried out, we thoroughly water picked out the knee, dried the  knee out, we inserted some Gelfoam into the femoral notch hole and down into  the tibial hole.  We then bone waxed the raw ends of the bones in the  intercondylar notch. We then irrigated the knee, dried the knee out and then  cemented all three components in simultaneously. We selected a 10 mm  thickness flex insert because it gave [place] an excellent fit for our flexion  extension and medial and lateral stability. Once all components were  cemented in, all loose pieces of cement were removed.  They were removed  during the course of the cementing procedure.  At the end of the procedure,  we removed the trial component and then checked for more cement. We  thoroughly irrigated out the knee, dried the knee out and finally inserted  our permanent 10 mm thickness flex insert, tibial insert.  The knee was  reduced, taken through motion, we had excellent stability. We inserted a  Penrose drain and closed the knee in layers in the usual fashion. We had 1 g  of IV Ancef preop.                                                Ronald A. Darrelyn Hillock, M.D.   RAG/MEDQ  D:  01/23/2004  T:  01/23/2004  Job:  [id number]

## 2011-05-19 ENCOUNTER — Encounter: Payer: Self-pay | Admitting: Cardiology

## 2011-07-09 LAB — URINE CULTURE
Colony Count: NO GROWTH
Culture: NO GROWTH
Special Requests: NEGATIVE

## 2011-07-09 LAB — CBC
HCT: 31 — ABNORMAL LOW
HCT: 31.2 — ABNORMAL LOW
HCT: 35.8 — ABNORMAL LOW
HCT: 43.6
Hemoglobin: 10.2 — ABNORMAL LOW
Hemoglobin: 10.6 — ABNORMAL LOW
Hemoglobin: 10.7 — ABNORMAL LOW
MCHC: 34
MCHC: 34.5
MCV: 94.2
MCV: 95.1
MCV: 95.7
Platelets: 187
Platelets: 209
Platelets: 214
RBC: 3.24 — ABNORMAL LOW
RBC: 3.57 — ABNORMAL LOW
RDW: 12.8
RDW: 13.1
RDW: 13.3
RDW: 13.3
RDW: 13.3
WBC: 14.6 — ABNORMAL HIGH
WBC: 17.7 — ABNORMAL HIGH
WBC: 18.6 — ABNORMAL HIGH

## 2011-07-09 LAB — BASIC METABOLIC PANEL
BUN: 10
BUN: 13
CO2: 25
CO2: 30
Calcium: 8.3 — ABNORMAL LOW
Calcium: 8.4
Calcium: 8.5
Calcium: 8.7
Chloride: 104
Creatinine, Ser: 0.99
Creatinine, Ser: 1.02
GFR calc Af Amer: >60
GFR calc Af Amer: >60
GFR calc Af Amer: >60
GFR calc non Af Amer: >60
GFR calc non Af Amer: >60
Glucose, Bld: 107 — ABNORMAL HIGH
Glucose, Bld: 107 — ABNORMAL HIGH
Glucose, Bld: 108 — ABNORMAL HIGH
Potassium: 4.1
Sodium: 135
Sodium: 137

## 2011-07-09 LAB — CARDIAC PANEL(CRET KIN+CKTOT+MB+TROPI)
Relative Index: 0.7
Total CK: 372 — ABNORMAL HIGH
Troponin I: 0.02

## 2011-07-09 LAB — URINALYSIS, ROUTINE W REFLEX MICROSCOPIC
Bilirubin Urine: NEGATIVE
Hgb urine dipstick: NEGATIVE
Ketones, ur: NEGATIVE
Protein, ur: NEGATIVE
Urobilinogen, UA: 0.2

## 2011-07-09 LAB — TYPE AND SCREEN
ABO/RH(D): B POS
Antibody Screen: NEGATIVE

## 2011-07-09 LAB — DIGOXIN LEVEL: Digoxin Level: 0.3 — ABNORMAL LOW

## 2011-07-09 LAB — DIFFERENTIAL
Eosinophils Relative: 3
Lymphocytes Relative: 30
Lymphs Abs: 4.7 — ABNORMAL HIGH
Monocytes Absolute: 0.7
Neutro Abs: 9.7 — ABNORMAL HIGH

## 2011-07-09 LAB — PROTIME-INR
INR: 1.1
INR: 1.6 — ABNORMAL HIGH
INR: 2 — ABNORMAL HIGH
INR: 2.8 — ABNORMAL HIGH
Prothrombin Time: 13
Prothrombin Time: 14.2
Prothrombin Time: 23.6 — ABNORMAL HIGH

## 2011-07-09 LAB — COMPREHENSIVE METABOLIC PANEL
AST: 19
Albumin: 4.2
BUN: 10
Calcium: 10.2
Creatinine, Ser: 1.03
GFR calc Af Amer: >60

## 2011-07-09 LAB — APTT: aPTT: 27

## 2011-07-10 LAB — BASIC METABOLIC PANEL
CO2: 25
Chloride: 105
Creatinine, Ser: 0.93
GFR calc Af Amer: >60
Potassium: 3.8
Sodium: 137

## 2011-07-10 LAB — CBC
HCT: 38.5 — ABNORMAL LOW
Hemoglobin: 12.8 — ABNORMAL LOW
MCHC: 33.3
MCV: 96.9
RBC: 3.98 — ABNORMAL LOW

## 2011-10-27 ENCOUNTER — Encounter: Payer: Self-pay | Admitting: Cardiology

## 2011-10-29 ENCOUNTER — Encounter (INDEPENDENT_AMBULATORY_CARE_PROVIDER_SITE_OTHER): Payer: Self-pay | Admitting: *Deleted

## 2011-11-30 ENCOUNTER — Encounter (INDEPENDENT_AMBULATORY_CARE_PROVIDER_SITE_OTHER): Payer: Self-pay | Admitting: *Deleted

## 2011-12-09 ENCOUNTER — Ambulatory Visit (INDEPENDENT_AMBULATORY_CARE_PROVIDER_SITE_OTHER): Payer: Medicare Other | Admitting: Internal Medicine

## 2011-12-23 ENCOUNTER — Encounter: Payer: Self-pay | Admitting: Cardiology

## 2011-12-23 ENCOUNTER — Ambulatory Visit (INDEPENDENT_AMBULATORY_CARE_PROVIDER_SITE_OTHER): Payer: Medicare Other | Admitting: Cardiology

## 2011-12-23 VITALS — BP 140/85 | HR 76 | Ht 67.0 in | Wt 204.0 lb

## 2011-12-23 DIAGNOSIS — E663 Overweight: Secondary | ICD-10-CM

## 2011-12-23 DIAGNOSIS — I10 Essential (primary) hypertension: Secondary | ICD-10-CM

## 2011-12-23 DIAGNOSIS — I2510 Atherosclerotic heart disease of native coronary artery without angina pectoris: Secondary | ICD-10-CM

## 2011-12-23 DIAGNOSIS — I4891 Unspecified atrial fibrillation: Secondary | ICD-10-CM

## 2011-12-23 NOTE — Progress Notes (Signed)
HPI The patient presents for followup of his coronary disease and atrial fibrillation. Since I last saw him he has done well.  He has had rare fleeting axillary left-sided discomfort. The patient denies any new symptoms such as chest pressure, neck or arm discomfort. There has been no new shortness of breath, PND or orthopnea. There have been no reported palpitations, presyncope or syncope.  He has had none of the acute shortness of breath that was his previous angina. He's had no new symptoms since stress perfusion testing in 2010.  Allergies  Allergen Reactions  . Penicillins     Current Outpatient Prescriptions  Medication Sig Dispense Refill  . acetaminophen (TYLENOL) 650 MG CR tablet Take 650 mg by mouth as needed.        . carvedilol (COREG) 12.5 MG tablet Take 12.5 mg by mouth 2 (two) times daily.        Marland Kitchen diltiazem (CARDIZEM) 120 MG tablet Take 120 mg by mouth daily.        . furosemide (LASIX) 20 MG tablet Take 20 mg by mouth daily.        Marland Kitchen lovastatin (MEVACOR) 40 MG tablet Take 40 mg by mouth daily.        Marland Kitchen warfarin (COUMADIN) 5 MG tablet Take 5 mg by mouth as directed.          Past Medical History  Diagnosis Date  . Hypertension   . Persistent atrial fibrillation   . Coronary artery disease   . Ejection fraction < 50%     Mildly reduced, 40% by echo  . Anemia   . Cellulitis     In past  . DJD (degenerative joint disease)   . GERD (gastroesophageal reflux disease)   . PUD (peptic ulcer disease)   . Skin cancer of eyelid     Resected    Past Surgical History  Procedure Date  . Coronary artery bypass graft 4/05    LIMA to LAD, SVG to PDA, SVG to ramus intermediate  . Heart bypass 2005  . Total knee arthroplasty     Right  . Appendectomy   . Eyelid carcinoma excision     Skin cancer resected  . Total hip arthroplasty     Right     ROS:  As stated in the HPI and negative for all other systems.  PHYSICAL EXAM BP 140/85  Pulse 76  Ht 5\' 7"  (1.702 m)   Wt 204 lb (92.534 kg)  BMI 31.95 kg/m2 GENERAL:  Well appearing HEENT:  Pupils equal round and reactive, fundi not visualized, oral mucosa unremarkable, dentures NECK:  No jugular venous distention, waveform within normal limits, carotid upstroke brisk and symmetric, no bruits, no thyromegaly, irregular LYMPHATICS:  No cervical, inguinal adenopathy LUNGS:  Clear to auscultation bilaterally BACK:  No CVA tenderness CHEST:  Well healed sternotomy scar. HEART:  PMI not displaced or sustained,S1 and S2 within normal limits, no S3, no S4, no clicks, no rubs, no murmurs ABD:  Flat, positive bowel sounds normal in frequency in pitch, no bruits, no rebound, no guarding, no midline pulsatile mass, no hepatomegaly, no splenomegaly, obese EXT:  2 plus pulses throughout, no edema, no cyanosis no clubbing SKIN:  No rashes no nodules NEURO:  Cranial nerves II through XII grossly intact, motor grossly intact throughout PSYCH:  Cognitively intact, oriented to person place and time  EKG:  Atrial fibrillation, rate 81, PVCs, early transition in lead V2, nonspecific lateral ST flattening. 12/23/2011  ASSESSMENT  AND PLAN

## 2011-12-23 NOTE — Assessment & Plan Note (Signed)
His blood pressure is at the upper limits of target. He says it's usually well controlled at home. No further change in therapy is indicated.

## 2011-12-23 NOTE — Assessment & Plan Note (Signed)
We again discussed the need to lose weight with diet and exercise. 

## 2011-12-23 NOTE — Assessment & Plan Note (Signed)
The patient has no new sypmtoms.  No further cardiovascular testing is indicated.  We will continue with aggressive risk reduction and meds as listed.  

## 2011-12-23 NOTE — Assessment & Plan Note (Signed)
The patient  tolerates this rhythm and rate control and anticoagulation. We will continue with the meds as listed.  He does not care to switch off of warfarin to another agent.

## 2011-12-23 NOTE — Patient Instructions (Signed)
Continue current medications as listed.  Follow up in 1 year with Dr Hochrein.  You will receive a letter in the mail 2 months before you are due.  Please call us when you receive this letter to schedule your follow up appointment.  

## 2011-12-29 NOTE — Progress Notes (Signed)
Addended by: Judithe Modest D on: 12/29/2011 02:23 PM   Modules accepted: Orders

## 2012-08-15 ENCOUNTER — Encounter: Payer: Self-pay | Admitting: Cardiology

## 2012-08-17 ENCOUNTER — Telehealth: Payer: Self-pay | Admitting: Oncology

## 2012-08-17 NOTE — Telephone Encounter (Signed)
LVOM for pt to return call.  °

## 2012-08-29 ENCOUNTER — Encounter (HOSPITAL_COMMUNITY): Payer: Self-pay | Admitting: Oncology

## 2012-08-29 ENCOUNTER — Encounter (HOSPITAL_COMMUNITY): Payer: Medicare Other | Attending: Oncology | Admitting: Oncology

## 2012-08-29 VITALS — BP 135/78 | HR 61 | Temp 98.2°F | Resp 16 | Ht 67.5 in | Wt 201.0 lb

## 2012-08-29 DIAGNOSIS — M1990 Unspecified osteoarthritis, unspecified site: Secondary | ICD-10-CM | POA: Insufficient documentation

## 2012-08-29 DIAGNOSIS — I2510 Atherosclerotic heart disease of native coronary artery without angina pectoris: Secondary | ICD-10-CM | POA: Insufficient documentation

## 2012-08-29 DIAGNOSIS — E7800 Pure hypercholesterolemia, unspecified: Secondary | ICD-10-CM | POA: Insufficient documentation

## 2012-08-29 DIAGNOSIS — E663 Overweight: Secondary | ICD-10-CM | POA: Insufficient documentation

## 2012-08-29 DIAGNOSIS — I4891 Unspecified atrial fibrillation: Secondary | ICD-10-CM

## 2012-08-29 DIAGNOSIS — I10 Essential (primary) hypertension: Secondary | ICD-10-CM | POA: Insufficient documentation

## 2012-08-29 DIAGNOSIS — D72829 Elevated white blood cell count, unspecified: Secondary | ICD-10-CM | POA: Insufficient documentation

## 2012-08-29 LAB — CBC WITH DIFFERENTIAL/PLATELET
Eosinophils Absolute: 0.6 10*3/uL (ref 0.0–0.7)
Eosinophils Relative: 5 % (ref 0–5)
HCT: 40.6 % (ref 39.0–52.0)
Hemoglobin: 13.7 g/dL (ref 13.0–17.0)
Lymphocytes Relative: 40 % (ref 12–46)
Lymphs Abs: 4.6 10*3/uL — ABNORMAL HIGH (ref 0.7–4.0)
MCH: 32.9 pg (ref 26.0–34.0)
MCV: 97.6 fL (ref 78.0–100.0)
Monocytes Relative: 6 % (ref 3–12)
RBC: 4.16 MIL/uL — ABNORMAL LOW (ref 4.22–5.81)

## 2012-08-29 NOTE — Progress Notes (Signed)
[name] presented for labwork. Labs per MD order drawn via Peripheral Line 23 gauge needle inserted in right hand  Good blood return present. Procedure without incident.  Needle removed intact. Patient tolerated procedure well.

## 2012-08-29 NOTE — Patient Instructions (Addendum)
[facility]  Discharge Instructions  RECOMMENDATIONS MADE BY THE CONSULTANT AND ANY TEST RESULTS WILL BE SENT TO YOUR REFERRING DOCTOR.   EXAM FINDINGS BY MD TODAY AND SIGNS AND SYMPTOMS TO REPORT TO CLINIC OR PRIMARY MD: exam and discussion by MD.  Will check some lab work today.  MEDICATIONS PRESCRIBED: none   INSTRUCTIONS GIVEN AND DISCUSSED: Other :  MD will review your blood smear and if there are any issues we will call you.  SPECIAL INSTRUCTIONS/FOLLOW-UP: Lab work Needed every 6 weeks and Return to Clinic in 12 weeks to see MD.   I acknowledge that I have been informed and understand all the instructions given to me and received a copy. I do not have any more questions at this time, but understand that I may call the Specialty Clinic at [facility] at [phone] during business hours should I have any further questions or need assistance in obtaining follow-up care.    __________________________________________  _____________  __________ Signature of Patient or Authorized Representative            Date                   Time    __________________________________________ Nurse's Signature

## 2012-08-29 NOTE — Progress Notes (Signed)
Problem #1 leukocytosis-unclear etiology Problem #2 [name] status post CABG in 2005 after a right hip replacement Problem #3 degenerative joint disease of the knees and hips status post right knee replacement,  right hip replacement and at least 2 injections into his left hip with what he describes as artificial cartilage. 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 He tells me that his primary care physician has noticed his white count mildly elevated her several occasions within the last year. The last laboratory work that is available to me is 08/15/2012 at which time his white count was 17,100 with a mild increase in granulocytes, leukocytes, monocytes, and eosinophils, his hemoglobin was normal, hematocrit was normal, MCV was normal, and platelets were normal, and he has had no symptoms of infection or inflammation that I can gather from him on review of systems. The injection into the left hip joint is still in procedure he has had in recent months. He has no night sweats, no fevers, no chills, no cough, no chest pain, no hemoptysis, no sputum production, and no lymph node enlargement anywhere that he is aware of. He is not a smoker presently but smoked for approximately 8-10 years approximately a pack a day. He quit in 1965. He does not drink alcohol.  BP 135/78  Pulse 61  Temp 98.2 F (36.8 C) (Oral)  Resp 16  Ht 5' 7.5" (1.715 m)  Wt 201 lb (91.173 kg)  BMI  31.02 kg/m2  He is a pleasant gentleman in no acute distress. He is alert and oriented. Lungs are clear. He has no lymphadenopathy specifically in this supraclavicular, infraclavicular, cervical, axillary, epitrochlear, or inguinal areas. I could not detect hepatosplenomegaly. Heart showed a regular rhythm at this time. I did not detect an S3 gallop I could not appreciate his atrial fibrillation. He has scars on his left index finger right upper eyelid chest, abdomen, right knee etc. his abdomen is soft and nontender, bowel sounds are normal. He has no peripheral edema. Pulses in his feet are 1-2+. Nail color is unremarkable. He has DJD changes of his fingers. He has an upper down plate no partial lower plate in many missing teeth in the lower ridge. Throat is clear. Tongue is unremarkable. He has no thyromegaly. He had no gynecomastia. With his mixture of white cells minimally elevated I would like to repeat his blood work as a new baseline, look at his blood smear, and bring him back in 6 in 12 weeks unless things are seen otherwise in the blood work we will do today he is fine with this plan

## 2012-08-30 ENCOUNTER — Encounter (INDEPENDENT_AMBULATORY_CARE_PROVIDER_SITE_OTHER): Payer: Self-pay

## 2012-10-10 ENCOUNTER — Encounter (HOSPITAL_COMMUNITY): Payer: Medicare Other | Attending: Oncology

## 2012-10-10 DIAGNOSIS — D72829 Elevated white blood cell count, unspecified: Secondary | ICD-10-CM

## 2012-10-10 LAB — CBC WITH DIFFERENTIAL/PLATELET
Basophils Absolute: 0 10*3/uL (ref 0.0–0.1)
Eosinophils Relative: 6 % — ABNORMAL HIGH (ref 0–5)
HCT: 41.9 % (ref 39.0–52.0)
Hemoglobin: 14.1 g/dL (ref 13.0–17.0)
Lymphocytes Relative: 45 % (ref 12–46)
MCHC: 33.7 g/dL (ref 30.0–36.0)
MCV: 97.9 fL (ref 78.0–100.0)
Monocytes Absolute: 0.6 10*3/uL (ref 0.1–1.0)
Monocytes Relative: 6 % (ref 3–12)
RDW: 13.5 % (ref 11.5–15.5)

## 2012-10-10 NOTE — Progress Notes (Signed)
Labs drawn today for cbc/diff , sed rate 

## 2012-11-21 ENCOUNTER — Encounter (HOSPITAL_COMMUNITY): Payer: Medicare Other | Attending: Oncology

## 2012-11-21 DIAGNOSIS — D72820 Lymphocytosis (symptomatic): Secondary | ICD-10-CM | POA: Insufficient documentation

## 2012-11-21 DIAGNOSIS — D72829 Elevated white blood cell count, unspecified: Secondary | ICD-10-CM | POA: Insufficient documentation

## 2012-11-21 LAB — CBC WITH DIFFERENTIAL/PLATELET
Basophils Relative: 0 % (ref 0–1)
Eosinophils Absolute: 0.6 10*3/uL (ref 0.0–0.7)
Eosinophils Relative: 5 % (ref 0–5)
Hemoglobin: 14.9 g/dL (ref 13.0–17.0)
Lymphs Abs: 4.9 10*3/uL — ABNORMAL HIGH (ref 0.7–4.0)
MCH: 32.5 pg (ref 26.0–34.0)
MCHC: 33.3 g/dL (ref 30.0–36.0)
MCV: 97.6 fL (ref 78.0–100.0)
Monocytes Absolute: 0.7 10*3/uL (ref 0.1–1.0)
Monocytes Relative: 6 % (ref 3–12)
Neutrophils Relative %: 47 % (ref 43–77)
RBC: 4.59 MIL/uL (ref 4.22–5.81)

## 2012-11-21 NOTE — Progress Notes (Signed)
[name]'s reason for visit today are for labs as scheduled per MD orders.  Venipuncture performed with a 23 gauge butterfly needle to R Antecubital.  [name] tolerated venipuncture well and without incident; questions were answered and patient was discharged.

## 2012-11-22 ENCOUNTER — Encounter (HOSPITAL_BASED_OUTPATIENT_CLINIC_OR_DEPARTMENT_OTHER): Payer: Medicare Other | Admitting: Oncology

## 2012-11-22 ENCOUNTER — Encounter (HOSPITAL_COMMUNITY): Payer: Self-pay | Admitting: Oncology

## 2012-11-22 VITALS — BP 154/94 | HR 86 | Temp 97.8°F | Resp 18 | Wt 199.0 lb

## 2012-11-22 DIAGNOSIS — D72820 Lymphocytosis (symptomatic): Secondary | ICD-10-CM

## 2012-11-22 DIAGNOSIS — D72829 Elevated white blood cell count, unspecified: Secondary | ICD-10-CM

## 2012-11-22 NOTE — Progress Notes (Signed)
Problem number 1 leukocytosis from a mild increase in lymphocytes that has been present since at least 2009 with no real change. His peripheral smear shows some atypical lymphocytes only. He also has a few large platelets. Red blood cells look normal and monocytes look unremarkable.  I suspect this gentleman but has a reactive leukocytosis versus a benign monoclonal B-cell lymphocyte population versus early CLL that does not need any intervention with any the above diagnoses.  I offered him flow cytometry to rule out CLL versus a B-cell monoclonal lymphocytosis. He would like to just be followed at this time since there has been no change in 4 and half years. I think this is a very reasonable approach. Therefore we will see him back in 6 months with repeat blood work and a blood smear.

## 2012-11-22 NOTE — Patient Instructions (Addendum)
[facility] Discharge Instructions  RECOMMENDATIONS MADE BY THE CONSULTANT AND ANY TEST RESULTS WILL BE SENT TO YOUR REFERRING PHYSICIAN.  Return to clinic in 6 months for lab work and then to see Dr.Neijstrom. Continue follow-up with your primary medical doctor. Report issues/concerns to clinic as needed prior to appointments. If you have any lab work done any where else, have them send results to [place] as well.  Thank you for choosing [name] to provide your oncology and hematology care.  To afford each patient quality time with our providers, please arrive at least 15 minutes before your scheduled appointment time.  With your help, our goal is to use those 15 minutes to complete the necessary work-up to ensure our physicians have the information they need to help with your evaluation and healthcare recommendations.    Effective January 1st, 2014, we ask that you re-schedule your appointment with our physicians should you arrive 10 or more minutes late for your appointment.  We strive to give you quality time with our providers, and arriving late affects you and other patients whose appointments are after yours.    Again, thank you for choosing [facility].  Our hope is that these requests will decrease the amount of time that you wait before being seen by our physicians.       _____________________________________________________________  Should you have questions after your visit to [facility], please contact our office at [phone] between the hours of 8:30 a.m. and 5:00 p.m.  Voicemails left after 4:30 p.m. will not be returned until the following business day.  For prescription refill requests, have your pharmacy contact our office with your prescription refill request.

## 2012-12-14 ENCOUNTER — Ambulatory Visit (INDEPENDENT_AMBULATORY_CARE_PROVIDER_SITE_OTHER): Payer: BC Managed Care – HMO | Admitting: Cardiology

## 2012-12-14 ENCOUNTER — Encounter: Payer: Self-pay | Admitting: Cardiology

## 2012-12-14 VITALS — BP 126/76 | HR 83 | Ht 67.0 in | Wt 205.0 lb

## 2012-12-14 DIAGNOSIS — I4891 Unspecified atrial fibrillation: Secondary | ICD-10-CM

## 2012-12-14 NOTE — Progress Notes (Signed)
HPI The patient presents for followup of his coronary disease and atrial fibrillation. Since I last saw him he has done well. The patient denies any new symptoms such as chest pressure, neck or arm discomfort. There has been no new shortness of breath, PND or orthopnea. There have been no reported palpitations, presyncope or syncope.  He has had none of the acute shortness of breath that was his previous angina. He's had no new symptoms since stress perfusion testing in 2010.  He is limited by joint pains and is considering another hip surgery. His most exerting activity is bowling which causes no symptoms  Allergies  Allergen Reactions  . Penicillins     Current Outpatient Prescriptions  Medication Sig Dispense Refill  . acetaminophen (TYLENOL) 650 MG CR tablet Take 650 mg by mouth as needed.        . carvedilol (COREG) 12.5 MG tablet Take 12.5 mg by mouth 2 (two) times daily.        . Cholecalciferol (VITAMIN D-3 PO) Take 1 tablet by mouth daily.      Marland Kitchen diltiazem (CARDIZEM CD) 120 MG 24 hr capsule Take 120 mg by mouth daily.       . furosemide (LASIX) 20 MG tablet Take 20 mg by mouth daily.        Marland Kitchen lovastatin (MEVACOR) 40 MG tablet Take 20 mg by mouth daily. Take 1/2 tablet to equal 20mg  daily      . warfarin (COUMADIN) 5 MG tablet Take 5 mg by mouth daily.        No current facility-administered medications for this visit.    Past Medical History  Diagnosis Date  . Hypertension   . Persistent atrial fibrillation   . Coronary artery disease   . Ejection fraction < 50%     Mildly reduced, 40% by echo  . Anemia   . Cellulitis     In past  . DJD (degenerative joint disease)   . GERD (gastroesophageal reflux disease)   . PUD (peptic ulcer disease)   . Skin cancer of eyelid     Resected    Past Surgical History  Procedure Laterality Date  . Coronary artery bypass graft  4/05    LIMA to LAD, SVG to PDA, SVG to ramus intermediate  . Heart bypass  2005  . Total knee  arthroplasty      Right  . Appendectomy    . Eyelid carcinoma excision      Skin cancer resected  . Total hip arthroplasty      Right     ROS:  As stated in the HPI and negative for all other systems.  PHYSICAL EXAM BP 126/76  Pulse 83  Ht 5\' 7"  (1.702 m)  Wt 205 lb (92.987 kg)  BMI 32.1 kg/m2 GENERAL:  Well appearing HEENT:  Pupils equal round and reactive, fundi not visualized, oral mucosa unremarkable, dentures NECK:  No jugular venous distention, waveform within normal limits, carotid upstroke brisk and symmetric, no bruits, no thyromegaly, irregular LYMPHATICS:  No cervical, inguinal adenopathy LUNGS:  Clear to auscultation bilaterally BACK:  No CVA tenderness CHEST:  Well healed sternotomy scar. HEART:  PMI not displaced or sustained,S1 and S2 within normal limits, no S3, no clicks, no rubs, no murmurs, irregular ABD:  Flat, positive bowel sounds normal in frequency in pitch, no bruits, no rebound, no guarding, no midline pulsatile mass, no hepatomegaly, no splenomegaly, obese EXT:  2 plus pulses throughout, no edema, no cyanosis  no clubbing SKIN:  No rashes no nodules NEURO:  Cranial nerves II through XII grossly intact, motor grossly intact throughout PSYCH:  Cognitively intact, oriented to person place and time  EKG:  Atrial fibrillation, rate 79, early transition in lead V2, nonspecific lateral ST flattening. 12/14/2012  ASSESSMENT AND PLAN  [name]:  The patient has no new sypmtoms.  No further cardiovascular testing is indicated.  We will continue with aggressive risk reduction and meds as listed. He did have a stress perfusion study 2010 and no new symptoms since then. I will likely repeat a stress test next year as his bypass graft 79 years old.  HTN:  The blood pressure is at target. No change in medications is indicated. We will continue with therapeutic lifestyle changes (TLC).  HYPERLIPIDEMIA:  This is followed closely by Dr. Christell Constant.  The patient he is intolerant of  statins.  OVERWEIGHT:  We discussed this. His weights have been stable.  ATRIAL FIBRILLATION:  The patient  tolerates this rhythm and rate control and anticoagulation. We will continue with the meds as listed. Is not interested in switching to another agent.

## 2012-12-14 NOTE — Patient Instructions (Addendum)
The current medical regimen is effective;  continue present plan and medications.  Follow up in 1 year with Dr Hochrein.  You will receive a letter in the mail 2 months before you are due.  Please call us when you receive this letter to schedule your follow up appointment.  

## 2012-12-30 ENCOUNTER — Other Ambulatory Visit: Payer: Self-pay | Admitting: *Deleted

## 2012-12-30 MED ORDER — WARFARIN SODIUM 5 MG PO TABS: 5.0000 mg | ORAL_TABLET | Freq: Every day | ORAL | Status: DC

## 2013-01-25 ENCOUNTER — Other Ambulatory Visit: Payer: Self-pay | Admitting: *Deleted

## 2013-01-25 MED ORDER — WARFARIN SODIUM 5 MG PO TABS: 5.0000 mg | ORAL_TABLET | Freq: Every day | ORAL | Status: DC

## 2013-01-25 NOTE — Telephone Encounter (Signed)
LAST INR 1/14. SUPPOSE TO [facility] IN 4 WEEKS

## 2013-02-02 ENCOUNTER — Other Ambulatory Visit: Payer: Self-pay | Admitting: *Deleted

## 2013-02-02 MED ORDER — CARVEDILOL 12.5 MG PO TABS: 12.5000 mg | ORAL_TABLET | Freq: Two times a day (BID) | ORAL | Status: DC

## 2013-02-14 ENCOUNTER — Ambulatory Visit (INDEPENDENT_AMBULATORY_CARE_PROVIDER_SITE_OTHER): Payer: Medicare Other | Admitting: Pharmacist Clinician (PhC)/ Clinical Pharmacy Specialist

## 2013-02-14 DIAGNOSIS — Z7901 Long term (current) use of anticoagulants: Secondary | ICD-10-CM | POA: Insufficient documentation

## 2013-02-14 DIAGNOSIS — I4891 Unspecified atrial fibrillation: Secondary | ICD-10-CM

## 2013-02-14 LAB — POCT INR: INR: 2.3

## 2013-02-28 ENCOUNTER — Other Ambulatory Visit: Payer: Self-pay

## 2013-02-28 DIAGNOSIS — D72829 Elevated white blood cell count, unspecified: Secondary | ICD-10-CM

## 2013-02-28 MED ORDER — DILTIAZEM HCL ER COATED BEADS 120 MG PO CP24: 120.0000 mg | ORAL_CAPSULE | Freq: Every day | ORAL | Status: DC

## 2013-03-28 ENCOUNTER — Ambulatory Visit (INDEPENDENT_AMBULATORY_CARE_PROVIDER_SITE_OTHER): Payer: Medicare Other | Admitting: Pharmacist

## 2013-03-28 DIAGNOSIS — I4891 Unspecified atrial fibrillation: Secondary | ICD-10-CM

## 2013-03-28 DIAGNOSIS — Z7901 Long term (current) use of anticoagulants: Secondary | ICD-10-CM

## 2013-03-28 NOTE — Patient Instructions (Signed)
Anticoagulation Dose Instructions as of 03/28/2013     [name] Tue Wed Thu Fri Sat   New Dose 5 mg 5 mg 5 mg 5 mg 5 mg 5 mg 5 mg    Description       Continue taking 5mg  1 tablet daily      INR = 2.5

## 2013-05-08 ENCOUNTER — Encounter: Payer: Self-pay | Admitting: Family Medicine

## 2013-05-08 ENCOUNTER — Ambulatory Visit (INDEPENDENT_AMBULATORY_CARE_PROVIDER_SITE_OTHER): Payer: Medicare Other | Admitting: Family Medicine

## 2013-05-08 ENCOUNTER — Other Ambulatory Visit: Payer: Self-pay

## 2013-05-08 VITALS — BP 151/87 | HR 81 | Temp 97.5°F | Ht 67.0 in | Wt 195.6 lb

## 2013-05-08 DIAGNOSIS — H6122 Impacted cerumen, left ear: Secondary | ICD-10-CM

## 2013-05-08 DIAGNOSIS — E663 Overweight: Secondary | ICD-10-CM

## 2013-05-08 DIAGNOSIS — D649 Anemia, unspecified: Secondary | ICD-10-CM

## 2013-05-08 DIAGNOSIS — Z7901 Long term (current) use of anticoagulants: Secondary | ICD-10-CM

## 2013-05-08 DIAGNOSIS — I4891 Unspecified atrial fibrillation: Secondary | ICD-10-CM

## 2013-05-08 DIAGNOSIS — E785 Hyperlipidemia, unspecified: Secondary | ICD-10-CM

## 2013-05-08 DIAGNOSIS — R5383 Other fatigue: Secondary | ICD-10-CM

## 2013-05-08 DIAGNOSIS — I2510 Atherosclerotic heart disease of native coronary artery without angina pectoris: Secondary | ICD-10-CM

## 2013-05-08 DIAGNOSIS — K219 Gastro-esophageal reflux disease without esophagitis: Secondary | ICD-10-CM

## 2013-05-08 DIAGNOSIS — H6120 Impacted cerumen, unspecified ear: Secondary | ICD-10-CM

## 2013-05-08 DIAGNOSIS — I10 Essential (primary) hypertension: Secondary | ICD-10-CM

## 2013-05-08 DIAGNOSIS — E559 Vitamin D deficiency, unspecified: Secondary | ICD-10-CM

## 2013-05-08 LAB — POCT CBC
MCH, POC: 32.6 pg — AB (ref 27–31.2)
MCHC: 34 g/dL (ref 31.8–35.4)
MCV: 95.9 fL (ref 80–97)
MPV: 8.6 fL (ref 0–99.8)
POC LYMPH PERCENT: 4.5 %L — AB (ref 10–50)
Platelet Count, POC: 194 10*3/uL (ref 142–424)
RBC: 4.4 M/uL — AB (ref 4.69–6.13)
RDW, POC: 13.1 %

## 2013-05-08 LAB — BASIC METABOLIC PANEL WITH GFR
Chloride: 106 mEq/L (ref 96–112)
GFR, Est African American: 74 mL/min
GFR, Est Non African American: 64 mL/min
Potassium: 4.5 mEq/L (ref 3.5–5.3)
Sodium: 143 mEq/L (ref 135–145)

## 2013-05-08 LAB — HEPATIC FUNCTION PANEL
ALT: 14 U/L (ref 0–53)
Albumin: 4.3 g/dL (ref 3.5–5.2)
Total Protein: 6.8 g/dL (ref 6.0–8.3)

## 2013-05-08 MED ORDER — CARVEDILOL 12.5 MG PO TABS: 12.5000 mg | ORAL_TABLET | Freq: Two times a day (BID) | ORAL | Status: DC

## 2013-05-08 NOTE — Progress Notes (Signed)
Subjective:    Patient ID: [name], male    DOB: 1932/04/28, 79 y.o.   MRN: [id number]  HPI Patient returns to clinic today for followup and management of chronic medical conditions. These include hypertension, hyperlipidemia, [name] with atrial fibrillation, and the use of high-risk medication. His health maintenance parameters are up-to-date except for returning and FOBT. Also see the review of systems. This patient is not a complainer. His wife says that things are going well at home with him.   Review of Systems  Constitutional: Positive for fatigue.  HENT: Positive for postnasal drip (slight). Negative for ear pain, congestion and sneezing.   Eyes: Negative.   Respiratory: Negative.   Cardiovascular: Negative for chest pain, palpitations and leg swelling.  Gastrointestinal: Positive for constipation (occasional). Negative for nausea and abdominal pain.  Endocrine: Negative.   Genitourinary: Positive for frequency (occasional at PM). Negative for dysuria.  Musculoskeletal: Positive for arthralgias (bilateral knees). Negative for back pain.  Skin: Negative.   Allergic/Immunologic: Negative.   Neurological: Negative.   Hematological: Bruises/bleeds easily (due to meds).  Psychiatric/Behavioral: Negative.    Patient is still taking statin drugs i.e. Crestor 2-3 days weekly depending on the degree of myalgias that he has.    Objective:   Physical Exam BP 151/87  Pulse 81  Temp(Src) 97.5 F (36.4 C) (Oral)  Ht 5\' 7"  (1.702 m)  Wt 195 lb 9.6 oz (88.724 kg)  BMI 30.63 kg/m2  The patient appeared well nourished and normally developed for is age, alert and oriented to time and place. Speech, behavior and judgement appear normal. Vital signs as documented.  Head exam is unremarkable. No scleral icterus or pallor noted. He does have an impacted left EAC with cerumen. There is some nasal congestion on the right. Mouth and throat were normal. Neck is without jugular venous  distension, thyromegally, or carotid bruits. Carotid upstrokes are brisk bilaterally. No cervical adenopathy. Lungs are clear anteriorly and posteriorly to auscultation. Normal respiratory effort. Cardiac exam reveals regular rate and rhythm at 84 per minute. First and second heart sounds normal.  No murmurs, rubs or gallops.  Abdominal exam reveals obesity, normal bowl sounds, no masses, no organomegaly and no aortic enlargement. No inguinal adenopathy. There is no epigastric or abdominal tenderness. Extremities are nonedematous and both femoral and pedal pulses are normal. Skin without pallor or jaundice.  Warm and dry, without rash. Neurologic exam reveals normal deep tendon reflexes and normal sensation.          Assessment & Plan:  1. Hyperlipemia - NMR, lipoprofile; Standing - Hepatic function panel; Standing - NMR, lipoprofile - Hepatic function panel  2. Hypertension - POCT CBC; Standing - POCT CBC - BASIC METABOLIC PANEL WITH GFR  3. Vitamin D deficiency - Vitamin D 25 hydroxy; Standing - Vitamin D 25 hydroxy  4. Fatigue - POCT CBC; Standing - POCT CBC  5. Atrial fibrillation - POCT INR  6. Long term (current) use of anticoagulants See Coumadin clinic note  7. ANEMIA  8. [name] -Continue followup with a cardiologist  9. GERD  10. OVERWEIGHT -Continue aggressive therapeutic lifestyle changes  11. Left ear impacted cerumen -Left ear canal  irrigated  Patient Instructions   Fall precautions discussed Continue current meds and therapeutic lifestyle changes Return FOBT You may use debrox periodically as needed for ear cerumen, this will soften ear wax so that it is more easy to remove with irrigation. This is over-the-counter. You may also try Nasacort AQ one spray  each nostril at bedtime for rhinitis. This medication is also over-the-counter. See Coumadin dose change below:  Anticoagulation Dose Instructions as of 05/08/2013     Glynis Smiles Tue Wed Thu Fri  Sat   New Dose 5 mg 2.5 mg 5 mg 5 mg 5 mg 2.5 mg 5 mg    Description       Decrease dose to 1/2 tablet mondays and fridays and 1 tablet all other days.         Nyra Capes MD

## 2013-05-08 NOTE — Patient Instructions (Addendum)
Fall precautions discussed Continue current meds and therapeutic lifestyle changes Return FOBT You may use debrox periodically as needed for ear cerumen, this will soften ear wax so that it is more easy to remove with irrigation. This is over-the-counter. You may also try Nasacort AQ one spray each nostril at bedtime for rhinitis. This medication is also over-the-counter. See Coumadin dose change below:  Anticoagulation Dose Instructions as of 05/08/2013     Glynis Smiles Tue Wed Thu Fri Sat   New Dose 5 mg 2.5 mg 5 mg 5 mg 5 mg 2.5 mg 5 mg    Description       Decrease dose to 1/2 tablet mondays and fridays and 1 tablet all other days.

## 2013-05-09 LAB — NMR LIPOPROFILE WITH LIPIDS
HDL Size: 9.3 nm (ref >=9.2)
HDL-C: 48 mg/dL (ref >=40)
LDL Particle Number: 1393 nmol/L — ABNORMAL HIGH (ref <1000)
LDL Size: 21 nm (ref >20.5)
Large HDL-P: 7.5 umol/L (ref >=4.8)
Triglycerides: 133 mg/dL (ref <150)
VLDL Size: 45.2 nm (ref <=46.6)

## 2013-05-11 ENCOUNTER — Other Ambulatory Visit (INDEPENDENT_AMBULATORY_CARE_PROVIDER_SITE_OTHER): Payer: Medicare Other

## 2013-05-11 DIAGNOSIS — Z1212 Encounter for screening for malignant neoplasm of rectum: Secondary | ICD-10-CM

## 2013-05-12 LAB — FECAL OCCULT BLOOD, IMMUNOCHEMICAL: Fecal Occult Bld: NEGATIVE

## 2013-05-22 ENCOUNTER — Other Ambulatory Visit (HOSPITAL_COMMUNITY): Payer: BC Managed Care – HMO

## 2013-05-24 ENCOUNTER — Ambulatory Visit (HOSPITAL_COMMUNITY): Payer: BC Managed Care – HMO

## 2013-06-08 ENCOUNTER — Ambulatory Visit (INDEPENDENT_AMBULATORY_CARE_PROVIDER_SITE_OTHER): Payer: Medicare Other | Admitting: Pharmacist

## 2013-06-08 DIAGNOSIS — Z7901 Long term (current) use of anticoagulants: Secondary | ICD-10-CM

## 2013-06-08 DIAGNOSIS — I4891 Unspecified atrial fibrillation: Secondary | ICD-10-CM

## 2013-06-08 LAB — POCT INR: INR: 3

## 2013-06-08 MED ORDER — DICLOFENAC SODIUM 1 % TD GEL: TRANSDERMAL | Status: DC

## 2013-06-08 NOTE — Addendum Note (Signed)
Addended by: Henrene Pastor on: 06/08/2013 08:39 AM   Modules accepted: Orders

## 2013-06-08 NOTE — Patient Instructions (Signed)
Anticoagulation Dose Instructions as of 06/08/2013     [name] Tue Wed Thu Fri Sat   New Dose 5 mg 2.5 mg 5 mg 5 mg 5 mg 2.5 mg 5 mg    Description       Continue dose to 1/2 tablet mondays and fridays and 1 tablet all other days.        INR was 3.0 today

## 2013-06-08 NOTE — Progress Notes (Signed)
Patient c/o of arthritis pain in hands that has been increasing over last few months.  He is unable to take oral NSAIDs due to warfarin.  Will try topical Voltaren 1% GEL  Apply 1 gram to hands or other affected joint up to 4 times daily.

## 2013-06-29 ENCOUNTER — Other Ambulatory Visit: Payer: Self-pay

## 2013-06-29 DIAGNOSIS — D72829 Elevated white blood cell count, unspecified: Secondary | ICD-10-CM

## 2013-06-29 MED ORDER — DILTIAZEM HCL ER COATED BEADS 120 MG PO CP24: 120.0000 mg | ORAL_CAPSULE | Freq: Every day | ORAL | Status: DC

## 2013-07-13 ENCOUNTER — Telehealth: Payer: Self-pay | Admitting: Pharmacist

## 2013-07-13 ENCOUNTER — Telehealth: Payer: Self-pay | Admitting: Family Medicine

## 2013-07-13 NOTE — Telephone Encounter (Signed)
Patient called - appt rescheduled for 07/27/13 at 8am

## 2013-07-13 NOTE — Telephone Encounter (Signed)
Please see first phone call encounter - taken care of

## 2013-07-17 ENCOUNTER — Ambulatory Visit (INDEPENDENT_AMBULATORY_CARE_PROVIDER_SITE_OTHER): Payer: Medicare Other

## 2013-07-17 DIAGNOSIS — Z23 Encounter for immunization: Secondary | ICD-10-CM

## 2013-08-03 ENCOUNTER — Ambulatory Visit (INDEPENDENT_AMBULATORY_CARE_PROVIDER_SITE_OTHER): Payer: Medicare Other | Admitting: Pharmacist

## 2013-08-03 DIAGNOSIS — I4891 Unspecified atrial fibrillation: Secondary | ICD-10-CM

## 2013-08-03 DIAGNOSIS — Z7901 Long term (current) use of anticoagulants: Secondary | ICD-10-CM

## 2013-08-03 LAB — POCT INR: INR: 1.3

## 2013-08-03 NOTE — Patient Instructions (Signed)
.   Anticoagulation Dose Instructions as of 08/03/2013     Glynis Smiles Tue Wed Thu Fri Sat   New Dose 5 mg 2.5 mg 5 mg 5 mg 5 mg 2.5 mg 5 mg    Description       Take 1 and 1/2 tablet tonight and 1 tablet tomorrow then restart 1/2 tablet mondays and fridays and 1 tablet all other days.        INR was 1.3 today

## 2013-08-29 ENCOUNTER — Other Ambulatory Visit: Payer: Self-pay | Admitting: *Deleted

## 2013-08-29 ENCOUNTER — Ambulatory Visit (INDEPENDENT_AMBULATORY_CARE_PROVIDER_SITE_OTHER): Payer: Medicare Other | Admitting: Pharmacist Clinician (PhC)/ Clinical Pharmacy Specialist

## 2013-08-29 DIAGNOSIS — Z7901 Long term (current) use of anticoagulants: Secondary | ICD-10-CM

## 2013-08-29 DIAGNOSIS — I4891 Unspecified atrial fibrillation: Secondary | ICD-10-CM

## 2013-08-29 MED ORDER — CARVEDILOL 12.5 MG PO TABS: 12.5000 mg | ORAL_TABLET | Freq: Two times a day (BID) | ORAL | Status: DC

## 2013-08-29 MED ORDER — FUROSEMIDE 20 MG PO TABS: 20.0000 mg | ORAL_TABLET | Freq: Every day | ORAL | Status: DC

## 2013-10-02 ENCOUNTER — Other Ambulatory Visit: Payer: Self-pay | Admitting: *Deleted

## 2013-10-02 ENCOUNTER — Ambulatory Visit (INDEPENDENT_AMBULATORY_CARE_PROVIDER_SITE_OTHER): Payer: Medicare Other | Admitting: Pharmacist

## 2013-10-02 DIAGNOSIS — D72829 Elevated white blood cell count, unspecified: Secondary | ICD-10-CM

## 2013-10-02 DIAGNOSIS — I4891 Unspecified atrial fibrillation: Secondary | ICD-10-CM

## 2013-10-02 DIAGNOSIS — Z7901 Long term (current) use of anticoagulants: Secondary | ICD-10-CM

## 2013-10-02 MED ORDER — DILTIAZEM HCL ER COATED BEADS 120 MG PO CP24: 120.0000 mg | ORAL_CAPSULE | Freq: Every day | ORAL | Status: DC

## 2013-10-02 NOTE — Patient Instructions (Signed)
Anticoagulation Dose Instructions as of 10/02/2013     [name] Tue Wed Thu Fri Sat   New Dose 5 mg 2.5 mg 5 mg 5 mg 5 mg 2.5 mg 5 mg    Description       Continue current dose of 1/2 tablet mondays and fridays and 1 tablet all other days.       INR was 2.8 today

## 2013-10-30 ENCOUNTER — Encounter: Payer: Self-pay | Admitting: Family Medicine

## 2013-10-30 ENCOUNTER — Ambulatory Visit (INDEPENDENT_AMBULATORY_CARE_PROVIDER_SITE_OTHER): Payer: Medicare Other

## 2013-10-30 ENCOUNTER — Other Ambulatory Visit: Payer: Self-pay | Admitting: *Deleted

## 2013-10-30 ENCOUNTER — Ambulatory Visit (INDEPENDENT_AMBULATORY_CARE_PROVIDER_SITE_OTHER): Payer: Medicare Other | Admitting: Pharmacist

## 2013-10-30 ENCOUNTER — Ambulatory Visit (INDEPENDENT_AMBULATORY_CARE_PROVIDER_SITE_OTHER): Payer: Medicare Other | Admitting: Family Medicine

## 2013-10-30 VITALS — BP 155/98 | HR 66 | Temp 97.0°F | Ht 67.0 in | Wt 198.0 lb

## 2013-10-30 DIAGNOSIS — I10 Essential (primary) hypertension: Secondary | ICD-10-CM

## 2013-10-30 DIAGNOSIS — D649 Anemia, unspecified: Secondary | ICD-10-CM

## 2013-10-30 DIAGNOSIS — I4891 Unspecified atrial fibrillation: Secondary | ICD-10-CM

## 2013-10-30 DIAGNOSIS — I2510 Atherosclerotic heart disease of native coronary artery without angina pectoris: Secondary | ICD-10-CM

## 2013-10-30 DIAGNOSIS — E559 Vitamin D deficiency, unspecified: Secondary | ICD-10-CM

## 2013-10-30 DIAGNOSIS — R7309 Other abnormal glucose: Secondary | ICD-10-CM

## 2013-10-30 DIAGNOSIS — K219 Gastro-esophageal reflux disease without esophagitis: Secondary | ICD-10-CM

## 2013-10-30 DIAGNOSIS — Z7901 Long term (current) use of anticoagulants: Secondary | ICD-10-CM

## 2013-10-30 DIAGNOSIS — D72829 Elevated white blood cell count, unspecified: Secondary | ICD-10-CM

## 2013-10-30 DIAGNOSIS — Z23 Encounter for immunization: Secondary | ICD-10-CM

## 2013-10-30 LAB — POCT CBC
Granulocyte percent: 54 % (ref 37–80)
HCT, POC: 43.4 % — AB (ref 43.5–53.7)
Hemoglobin: 14.3 g/dL (ref 14.1–18.1)
Lymph, poc: 5.4 — AB (ref 0.6–3.4)
MCH, POC: 31.5 pg — AB (ref 27–31.2)
MCHC: 32.9 g/dL (ref 31.8–35.4)
MCV: 95.6 fL (ref 80–97)
MPV: 8.7 fL (ref 0–99.8)
POC Granulocyte: 6.8 (ref 2–6.9)
POC LYMPH PERCENT: 42.5 % (ref 10–50)
Platelet Count, POC: 195 K/uL (ref 142–424)
RBC: 4.5 M/uL — AB (ref 4.69–6.13)
RDW, POC: 13.3 %
WBC: 12.6 K/uL — AB (ref 4.6–10.2)

## 2013-10-30 LAB — POCT INR: INR: 2.2

## 2013-10-30 LAB — POCT GLYCOSYLATED HEMOGLOBIN (HGB A1C): Hemoglobin A1C: 6

## 2013-10-30 MED ORDER — FUROSEMIDE 20 MG PO TABS: 20.0000 mg | ORAL_TABLET | Freq: Every day | ORAL | Status: DC

## 2013-10-30 MED ORDER — DILTIAZEM HCL ER COATED BEADS 120 MG PO CP24: 120.0000 mg | ORAL_CAPSULE | Freq: Every day | ORAL | Status: DC

## 2013-10-30 NOTE — Progress Notes (Addendum)
Subjective:    Patient ID: [name], male    DOB: Mar 06, 1932, 80 y.o.   MRN: [id number]  HPI Pt here for follow up and management of chronic medical problems. As usual the patient has no specific complaints. He is due to bring back and FOBT, get a chest x-ray, get lab work, and get a Dietitian vaccine. He is going to reconsider the shingle shot. He indicates that his blood pressures at home run in the 150/85 range. He'll also get his INR today.        Patient Active Problem List   Diagnosis Date Noted  . Long term (current) use of anticoagulants 02/14/2013  . OVERWEIGHT 12/11/2009  . ANEMIA 02/25/2009  . HYPERTENSION 02/25/2009  . [name] 02/25/2009  . FIBRILLATION, ATRIAL 02/25/2009  . GERD 02/25/2009  . DEGENERATIVE JOINT DISEASE 02/25/2009   Outpatient Encounter Prescriptions as of 10/30/2013  Medication Sig  . acetaminophen (TYLENOL) 650 MG CR tablet Take 650 mg by mouth as needed.    . carvedilol (COREG) 12.5 MG tablet Take 1 tablet (12.5 mg total) by mouth 2 (two) times daily.  . Cholecalciferol (VITAMIN D-3 PO) Take 1 tablet by mouth daily.  . diclofenac sodium (VOLTAREN) 1 % GEL Apply 1 gram to hands or other affected joints up to 4 times daily  . diltiazem (CARDIZEM CD) 120 MG 24 hr capsule Take 1 capsule (120 mg total) by mouth daily.  . furosemide (LASIX) 20 MG tablet Take 1 tablet (20 mg total) by mouth daily.  . rosuvastatin (CRESTOR) 10 MG tablet Take 10 mg by mouth daily. One half tablet on Monday Wednesday and Friday as tolerated  . warfarin (COUMADIN) 5 MG tablet Take 1 tablet (5 mg total) by mouth daily.    Review of Systems  Constitutional: Negative.   HENT: Negative.   Eyes: Negative.   Respiratory: Negative.   Cardiovascular: Negative.   Gastrointestinal: Negative.   Endocrine: Negative.   Genitourinary: Negative.   Musculoskeletal: Negative.   Skin: Negative.   Allergic/Immunologic: Negative.   Neurological: Negative.   Hematological: Negative.     Psychiatric/Behavioral: Negative.        Objective:   Physical Exam  Nursing note and vitals reviewed. Constitutional: He is oriented to person, place, and time. He appears well-developed and well-nourished. No distress.  Calm  HENT:  Head: Normocephalic and atraumatic.  Right Ear: External ear normal.  Left Ear: External ear normal.  Mouth/Throat: Oropharynx is clear and moist. No oropharyngeal exudate.  Nasal congestion on the right side  Eyes: Conjunctivae and EOM are normal. Pupils are equal, round, and reactive to light. Right eye exhibits no discharge. Left eye exhibits no discharge. No scleral icterus.  Neck: Normal range of motion. Neck supple. No thyromegaly present.  No carotid bruits  Cardiovascular: Normal rate, normal heart sounds and intact distal pulses.  Exam reveals no gallop and no friction rub.   No murmur heard. Irregular irregular at 84 per minute  Pulmonary/Chest: Effort normal and breath sounds normal. No respiratory distress. He has no wheezes. He has no rales. He exhibits no tenderness.  Slightly tight cough but no wheezing  Abdominal: Soft. Bowel sounds are normal. He exhibits no mass. There is no tenderness. There is no rebound and no guarding.  Musculoskeletal: Normal range of motion. He exhibits no edema and no tenderness.  Lymphadenopathy:    He has no cervical adenopathy.  Neurological: He is alert and oriented to person, place, and time. He has normal reflexes.  Skin: Skin is warm and dry. No rash noted. No erythema. No pallor.  Psychiatric: He has a normal mood and affect. His behavior is normal. Judgment and thought content normal.   BP 155/98  Pulse 66  Temp(Src) 97 F (36.1 C) (Oral)  Ht _0  (1.702 m)  Wt 198 lb (89.812 kg)  BMI 31.00 kg/m2  [facility] reading (PRIMARY) by  Dr. Brunilda Payor x-ray-  degenerative changes in thoracic spine and heart is slightly enlarged with previous bypass                                     Assessment &  Plan:   1. ANEMIA - POCT CBC -Return FOBT  2. FIBRILLATION, ATRIAL - POCT CBC - POCT INR  3. [name] - POCT CBC - NMR, lipoprofile  4. GERD - POCT CBC  5. HYPERTENSION - DG Chest 2 View; Future - POCT CBC - BMP8+EGFR - Hepatic function panel - NMR, lipoprofile  6. Vitamin D deficiency - Vit D  25 hydroxy (rtn osteoporosis monitoring)  7. Atrial fibrillation - POCT CBC - POCT INR  Patient Instructions  Continue current medications. Continue good therapeutic lifestyle changes which include good diet and exercise. Fall precautions discussed with patient. Schedule your flu vaccine if you haven't had it yet If you are over 82 years old - you may need Prevnar 53 or the adult Pneumonia vaccine. Check blood pressures twice daily morning and night, and return blood pressure readings for review in 2 weeks Watch sodium intake Check with insurance regarding it pain for a shingle shot We will call you with the lab work once that is available  Take Mucinex maximum strength, one twice daily as needed for cough and congestion Continue to use cool mist humidifier Keep followup appointments with urologist and cardiologist   Arrie Senate MD

## 2013-10-30 NOTE — Progress Notes (Signed)
Patient seen by Dr Laurance Flatten for 3 month follow up

## 2013-10-30 NOTE — Patient Instructions (Addendum)
Continue current medications. Continue good therapeutic lifestyle changes which include good diet and exercise. Fall precautions discussed with patient. Schedule your flu vaccine if you haven't had it yet If you are over 78 years old - you may need Prevnar 83 or the adult Pneumonia vaccine. Check blood pressures twice daily morning and night, and return blood pressure readings for review in 2 weeks Watch sodium intake Check with insurance regarding it pain for a shingle shot We will call you with the lab work once that is available  Take Mucinex maximum strength, one twice daily as needed for cough and congestion Continue to use cool mist humidifier Keep followup appointments with urologist and cardiologist

## 2013-10-30 NOTE — Patient Instructions (Signed)
Anticoagulation Dose Instructions as of 10/30/2013     [name] Tue Wed Thu Fri Sat   New Dose 5 mg 2.5 mg 5 mg 5 mg 5 mg 2.5 mg 5 mg    Description       Continue current dose of 1/2 tablet mondays and fridays and 1 tablet all other days.       INR was 2.2 today

## 2013-10-30 NOTE — Addendum Note (Signed)
Addended by: Earlene Plater on: 10/30/2013 10:46 AM   Modules accepted: Orders

## 2013-10-31 LAB — NMR, LIPOPROFILE
CHOLESTEROL: 209 mg/dL — AB (ref <200)
HDL Cholesterol by NMR: 49 mg/dL (ref >=40)
HDL PARTICLE NUMBER: 22.4 umol/L — AB (ref >=30.5)
LDL Particle Number: 1565 nmol/L — ABNORMAL HIGH (ref <1000)
LDL SIZE: 21.5 nm (ref >20.5)
LDLC SERPL CALC-MCNC: 125 mg/dL — ABNORMAL HIGH (ref <100)
LP-IR Score: <25 (ref <=45)
SMALL LDL PARTICLE NUMBER: 517 nmol/L (ref <=527)
Triglycerides by NMR: 177 mg/dL — ABNORMAL HIGH (ref <150)

## 2013-10-31 LAB — BMP8+EGFR
BUN/Creatinine Ratio: 9 — ABNORMAL LOW (ref 10–22)
BUN: 9 mg/dL (ref 8–27)
CALCIUM: 9.6 mg/dL (ref 8.6–10.2)
CHLORIDE: 102 mmol/L (ref 97–108)
CO2: 25 mmol/L (ref 18–29)
Creatinine, Ser: 0.95 mg/dL (ref 0.76–1.27)
GFR calc Af Amer: 88 mL/min/{1.73_m2} (ref >59)
GFR, EST NON AFRICAN AMERICAN: 76 mL/min/{1.73_m2} (ref >59)
GLUCOSE: 114 mg/dL — AB (ref 65–99)
POTASSIUM: 4.3 mmol/L (ref 3.5–5.2)
SODIUM: 143 mmol/L (ref 134–144)

## 2013-10-31 LAB — HEPATIC FUNCTION PANEL
ALT: 13 IU/L (ref 0–44)
AST: 18 IU/L (ref 0–40)
Albumin: 4.4 g/dL (ref 3.5–4.8)
Alkaline Phosphatase: 60 IU/L (ref 39–117)
Bilirubin, Direct: 0.16 mg/dL (ref 0.00–0.40)
TOTAL PROTEIN: 7 g/dL (ref 6.0–8.5)
Total Bilirubin: 0.5 mg/dL (ref 0.0–1.2)

## 2013-10-31 LAB — VITAMIN D 25 HYDROXY (VIT D DEFICIENCY, FRACTURES): Vit D, 25-Hydroxy: 29.5 ng/mL — ABNORMAL LOW (ref 30.0–100.0)

## 2013-11-01 ENCOUNTER — Telehealth: Payer: Self-pay | Admitting: *Deleted

## 2013-11-01 ENCOUNTER — Other Ambulatory Visit: Payer: Self-pay | Admitting: *Deleted

## 2013-11-01 MED ORDER — EZETIMIBE 10 MG PO TABS: ORAL_TABLET | ORAL | Status: DC

## 2013-11-01 NOTE — Telephone Encounter (Signed)
Done

## 2013-11-01 NOTE — Telephone Encounter (Signed)
Please review this and make sure this is what you want to start him on. Thanks.

## 2013-11-30 ENCOUNTER — Other Ambulatory Visit: Payer: Self-pay

## 2013-11-30 MED ORDER — CARVEDILOL 12.5 MG PO TABS: 12.5000 mg | ORAL_TABLET | Freq: Two times a day (BID) | ORAL | Status: DC

## 2013-12-01 ENCOUNTER — Encounter (INDEPENDENT_AMBULATORY_CARE_PROVIDER_SITE_OTHER): Payer: Self-pay

## 2013-12-01 ENCOUNTER — Encounter: Payer: Self-pay | Admitting: Family Medicine

## 2013-12-01 ENCOUNTER — Ambulatory Visit (INDEPENDENT_AMBULATORY_CARE_PROVIDER_SITE_OTHER): Payer: Medicare Other

## 2013-12-01 ENCOUNTER — Ambulatory Visit (INDEPENDENT_AMBULATORY_CARE_PROVIDER_SITE_OTHER): Payer: Medicare Other | Admitting: Family Medicine

## 2013-12-01 VITALS — BP 137/84 | HR 75 | Temp 97.0°F | Ht 67.0 in | Wt 192.4 lb

## 2013-12-01 DIAGNOSIS — K219 Gastro-esophageal reflux disease without esophagitis: Secondary | ICD-10-CM

## 2013-12-01 DIAGNOSIS — E663 Overweight: Secondary | ICD-10-CM

## 2013-12-01 DIAGNOSIS — Z7901 Long term (current) use of anticoagulants: Secondary | ICD-10-CM

## 2013-12-01 DIAGNOSIS — R05 Cough: Secondary | ICD-10-CM

## 2013-12-01 DIAGNOSIS — I10 Essential (primary) hypertension: Secondary | ICD-10-CM

## 2013-12-01 DIAGNOSIS — I4891 Unspecified atrial fibrillation: Secondary | ICD-10-CM

## 2013-12-01 DIAGNOSIS — R0989 Other specified symptoms and signs involving the circulatory and respiratory systems: Secondary | ICD-10-CM

## 2013-12-01 DIAGNOSIS — J209 Acute bronchitis, unspecified: Secondary | ICD-10-CM

## 2013-12-01 DIAGNOSIS — D649 Anemia, unspecified: Secondary | ICD-10-CM

## 2013-12-01 DIAGNOSIS — I2510 Atherosclerotic heart disease of native coronary artery without angina pectoris: Secondary | ICD-10-CM

## 2013-12-01 DIAGNOSIS — R059 Cough, unspecified: Secondary | ICD-10-CM

## 2013-12-01 DIAGNOSIS — R0602 Shortness of breath: Secondary | ICD-10-CM

## 2013-12-01 LAB — POCT INFLUENZA A/B
Influenza A, POC: NEGATIVE
Influenza B, POC: NEGATIVE

## 2013-12-01 MED ORDER — DOXYCYCLINE HYCLATE 100 MG PO TABS: 100.0000 mg | ORAL_TABLET | Freq: Two times a day (BID) | ORAL | Status: DC

## 2013-12-01 MED ORDER — BENZONATATE 200 MG PO CAPS: 200.0000 mg | ORAL_CAPSULE | Freq: Two times a day (BID) | ORAL | Status: DC | PRN

## 2013-12-01 NOTE — Progress Notes (Signed)
Patient ID: [name], male   DOB: [birth year], 80 y.o.   MRN: [id number] SUBJECTIVE: CC: Chief Complaint  Patient presents with  . Acute Visit    congsestion coughing up white yellowish sputum  "feels like chest fulll of stuff".been sick for 1 week . saw Dr Laurance Flatten in Jan .for CPX.  accompanied by wife.    HPI: As above. 1 week ago started with a head cold. Then today congested in chest , no chest pain. Harder to catch his breath. A little SOB. Last time he smoked was 1965. Orthopnea: none.  Past Medical History  Diagnosis Date  . Hypertension   . Persistent atrial fibrillation   . Coronary artery disease   . Ejection fraction < 50%     Mildly reduced, 40% by echo  . Anemia   . Cellulitis     In past  . DJD (degenerative joint disease)   . GERD (gastroesophageal reflux disease)   . PUD (peptic ulcer disease)   . Skin cancer of eyelid     Resected   Past Surgical History  Procedure Laterality Date  . Coronary artery bypass graft  4/05    LIMA to LAD, SVG to PDA, SVG to ramus intermediate  . Heart bypass  2005  . Total knee arthroplasty      Right  . Appendectomy    . Eyelid carcinoma excision      Skin cancer resected  . Total hip arthroplasty      Right   History   Social History  . Marital Status: Married    Spouse Name: N/A    Number of Children: N/A  . Years of Education: N/A   Occupational History  . retired    Social History Main Topics  . Smoking status: Former Smoker    Quit date: 12/23/1963  . Smokeless tobacco: Not on file  . Alcohol Use: No  . Drug Use: No  . Sexual Activity: Not on file   Other Topics Concern  . Not on file   Social History Narrative   Lives in a [place] home.   Family History  Problem Relation Age of Onset  . Stroke Mother   . Stroke Father    Current Outpatient Prescriptions on File Prior to Visit  Medication Sig Dispense Refill  . acetaminophen (TYLENOL) 650 MG CR tablet Take 650 mg by mouth as needed.        .  carvedilol (COREG) 12.5 MG tablet Take 1 tablet (12.5 mg total) by mouth 2 (two) times daily.  60 tablet  4  . Cholecalciferol (VITAMIN D-3 PO) Take 1 tablet by mouth daily.      . diclofenac sodium (VOLTAREN) 1 % GEL Apply 1 gram to hands or other affected joints up to 4 times daily  100 g  2  . diltiazem (CARDIZEM CD) 120 MG 24 hr capsule Take 1 capsule (120 mg total) by mouth daily.  30 capsule  2  . ezetimibe (ZETIA) 10 MG tablet Take 1/2 tablet daily  30 tablet  3  . furosemide (LASIX) 20 MG tablet Take 1 tablet (20 mg total) by mouth daily.  30 tablet  2  . rosuvastatin (CRESTOR) 10 MG tablet Take 10 mg by mouth daily. One half tablet on Monday Wednesday and Friday as tolerated      . warfarin (COUMADIN) 5 MG tablet Take 1 tablet (5 mg total) by mouth daily.  30 tablet  0   No current facility-administered medications on  file prior to visit.   Allergies  Allergen Reactions  . Penicillins   . Hct [Hydrochlorothiazide] Other (See Comments)    hyper  . Liptruzet [Ezetimibe-Atorvastatin] Other (See Comments)    myalgia  . Statins Other (See Comments)    myalgia   Immunization History  Administered Date(s) Administered  . Influenza,inj,Quad PF,36+ Mos 07/17/2013  . Pneumococcal Conjugate-13 10/30/2013   Prior to Admission medications   Medication Sig Start Date End Date Taking? Authorizing Provider  acetaminophen (TYLENOL) 650 MG CR tablet Take 650 mg by mouth as needed.      Historical Provider, MD  carvedilol (COREG) 12.5 MG tablet Take 1 tablet (12.5 mg total) by mouth 2 (two) times daily. 11/30/13   Chipper Herb, MD  Cholecalciferol (VITAMIN D-3 PO) Take 1 tablet by mouth daily.    Historical Provider, MD  diclofenac sodium (VOLTAREN) 1 % GEL Apply 1 gram to hands or other affected joints up to 4 times daily 06/08/13   Chipper Herb, MD  diltiazem (CARDIZEM CD) 120 MG 24 hr capsule Take 1 capsule (120 mg total) by mouth daily. 10/30/13   Chipper Herb, MD  ezetimibe (ZETIA) 10  MG tablet Take 1/2 tablet daily 11/01/13   Chipper Herb, MD  furosemide (LASIX) 20 MG tablet Take 1 tablet (20 mg total) by mouth daily. 10/30/13   Chipper Herb, MD  rosuvastatin (CRESTOR) 10 MG tablet Take 10 mg by mouth daily. One half tablet on Monday Wednesday and Friday as tolerated    Historical Provider, MD  warfarin (COUMADIN) 5 MG tablet Take 1 tablet (5 mg total) by mouth daily. 01/25/13   Chipper Herb, MD     ROS: As above in the HPI. All other systems are stable or negative.  OBJECTIVE: APPEARANCE:  Patient in no acute distress.The patient appeared well nourished and normally developed. Acyanotic. Waist: VITAL SIGNS:BP 137/84  Pulse 75  Temp(Src) 97 F (36.1 C) (Oral)  Ht 5\' 7"  (1.702 m)  Wt 192 lb 6.4 oz (87.272 kg)  BMI 30.13 kg/m2  SpO2 93% WM obese  SKIN: warm and  Dry without overt rashes, tattoos and scars  HEAD and Neck: without JVD, Head and scalp: normal Eyes:No scleral icterus. Fundi normal, eye movements normal. Ears: Auricle normal, canal normal, Tympanic membranes normal, insufflation normal. Nose: normal Throat: normal Neck & thyroid: normal  CHEST & LUNGS: Chest wall: normal Lungs: Coarse bs. Rhonchi ,scatterred. Few bibasilar crackles.  CVS: Reveals the PMI to be normally located. Regular rhythm, First and Second Heart sounds are normal,  absence of murmurs, rubs or gallops. Peripheral vasculature: Radial pulses: normal Dorsal pedis pulses: normal Posterior pulses: normal  ABDOMEN:  Appearance: normal Benign, no organomegaly, no masses, no Abdominal Aortic enlargement. No Guarding , no rebound. No Bruits. Bowel sounds: normal  RECTAL: N/A GU: N/A  EXTREMETIES: nonedematous.  NEUROLOGIC: oriented to time,place and person; nonfocal.  ASSESSMENT: Cough - Plan: DG Chest 2 View, POCT Influenza A/B, EKG 12-Lead, benzonatate (TESSALON) 200 MG capsule  HYPERTENSION  GERD  FIBRILLATION, ATRIAL - Plan: EKG  12-Lead  [name]  ANEMIA  Overweight  Long term (current) use of anticoagulants  Chest congestion - Plan: doxycycline (VIBRA-TABS) 100 MG tablet  Acute bronchitis - Plan: doxycycline (VIBRA-TABS) 100 MG tablet  PLAN:  Orders Placed This Encounter  Procedures  . DG Chest 2 View    Standing Status: Future     Number of Occurrences: 1     Standing Expiration Date:  01/31/2015    Order Specific Question:  Reason for Exam (SYMPTOM  OR DIAGNOSIS REQUIRED)    Answer:  cough    Order Specific Question:  Preferred imaging location?    Answer:  Internal  . POCT Influenza A/B  . EKG 12-Lead   Results for orders placed in visit on 12/01/13  POCT INFLUENZA A/B      Result Value Ref Range   Influenza A, POC Negative     Influenza B, POC Negative     [facility] reading (PRIMARY) by  Dr. Jacelyn Grip  : borderline cardiomegaly. Bibasilar incraesed markings. no effusion seen.  EKG: no acute changes.                              Meds ordered this encounter  Medications  . doxycycline (VIBRA-TABS) 100 MG tablet    Sig: Take 1 tablet (100 mg total) by mouth 2 (two) times daily.    Dispense:  20 tablet    Refill:  0  . benzonatate (TESSALON) 200 MG capsule    Sig: Take 1 capsule (200 mg total) by mouth 2 (two) times daily as needed for cough.    Dispense:  20 capsule    Refill:  0   There are no discontinued medications. Return in about 4 days (around 12/05/2013) for recheck protime; follow up Dr Laurance Flatten.  Avagail Whittlesey P. Jacelyn Grip, M.D.

## 2013-12-04 ENCOUNTER — Ambulatory Visit (INDEPENDENT_AMBULATORY_CARE_PROVIDER_SITE_OTHER): Payer: Medicare Other | Admitting: Pharmacist

## 2013-12-04 DIAGNOSIS — Z7901 Long term (current) use of anticoagulants: Secondary | ICD-10-CM

## 2013-12-04 DIAGNOSIS — I4891 Unspecified atrial fibrillation: Secondary | ICD-10-CM

## 2013-12-04 LAB — POCT INR: INR: 4.5

## 2013-12-04 NOTE — Patient Instructions (Signed)
Anticoagulation Dose Instructions as of 12/04/2013     Dorene Grebe Tue Wed Thu Fri Sat   New Dose 5 mg 2.5 mg 5 mg 5 mg 5 mg 2.5 mg 5 mg    Description       No warfarin today or tomorrow, then take 1/2 tablet daily until finished with doxycycline.  Once finished with doxycycline restart regular dose.      INR was 4.5 today.

## 2013-12-14 ENCOUNTER — Ambulatory Visit (INDEPENDENT_AMBULATORY_CARE_PROVIDER_SITE_OTHER): Payer: Medicare Other | Admitting: Pharmacist

## 2013-12-14 DIAGNOSIS — Z7901 Long term (current) use of anticoagulants: Secondary | ICD-10-CM

## 2013-12-14 DIAGNOSIS — I4891 Unspecified atrial fibrillation: Secondary | ICD-10-CM

## 2013-12-14 LAB — POCT INR: INR: 2

## 2013-12-14 NOTE — Patient Instructions (Signed)
Anticoagulation Dose Instructions as of 12/14/2013     Dorene Grebe Tue Wed Thu Fri Sat   New Dose 5 mg 2.5 mg 5 mg 5 mg 5 mg 2.5 mg 5 mg    Description       Continue 1/2 tablet on Mondays and Fridays and 1 tablet all other days      INR was 2.0 today

## 2013-12-18 ENCOUNTER — Ambulatory Visit: Payer: Medicare Other | Admitting: Family Medicine

## 2014-01-24 ENCOUNTER — Other Ambulatory Visit: Payer: Self-pay | Admitting: Family Medicine

## 2014-01-25 ENCOUNTER — Encounter: Payer: Self-pay | Admitting: *Deleted

## 2014-01-25 ENCOUNTER — Ambulatory Visit (INDEPENDENT_AMBULATORY_CARE_PROVIDER_SITE_OTHER): Payer: Medicare Other | Admitting: Pharmacist

## 2014-01-25 DIAGNOSIS — I4891 Unspecified atrial fibrillation: Secondary | ICD-10-CM

## 2014-01-25 DIAGNOSIS — Z7901 Long term (current) use of anticoagulants: Secondary | ICD-10-CM

## 2014-01-25 LAB — POCT INR: INR: 2.6

## 2014-01-25 MED ORDER — WARFARIN SODIUM 5 MG PO TABS: 5.0000 mg | ORAL_TABLET | Freq: Every day | ORAL | Status: DC

## 2014-01-25 NOTE — Patient Instructions (Signed)
Anticoagulation Dose Instructions as of 01/25/2014     [name] Tue Wed Thu Fri Sat   New Dose 5 mg 2.5 mg 5 mg 5 mg 5 mg 2.5 mg 5 mg    Description       Continue 1/2 tablet on Mondays and Fridays and 1 tablet all other days     INR was 2.6 today

## 2014-01-26 ENCOUNTER — Other Ambulatory Visit: Payer: Self-pay | Admitting: *Deleted

## 2014-01-26 DIAGNOSIS — D72829 Elevated white blood cell count, unspecified: Secondary | ICD-10-CM

## 2014-01-26 MED ORDER — DILTIAZEM HCL ER COATED BEADS 120 MG PO CP24: 120.0000 mg | ORAL_CAPSULE | Freq: Every day | ORAL | Status: DC

## 2014-02-07 ENCOUNTER — Ambulatory Visit (INDEPENDENT_AMBULATORY_CARE_PROVIDER_SITE_OTHER): Payer: Medicare Other | Admitting: Cardiology

## 2014-02-07 ENCOUNTER — Encounter: Payer: Self-pay | Admitting: Cardiology

## 2014-02-07 VITALS — BP 149/89 | HR 84 | Ht 67.0 in | Wt 194.0 lb

## 2014-02-07 DIAGNOSIS — I10 Essential (primary) hypertension: Secondary | ICD-10-CM

## 2014-02-07 DIAGNOSIS — I2510 Atherosclerotic heart disease of native coronary artery without angina pectoris: Secondary | ICD-10-CM

## 2014-02-07 DIAGNOSIS — I4891 Unspecified atrial fibrillation: Secondary | ICD-10-CM

## 2014-02-07 NOTE — Patient Instructions (Signed)
The current medical regimen is effective;  continue present plan and medications.  Follow up in 1 year with Dr Hochrein.  You will receive a letter in the mail 2 months before you are due.  Please call us when you receive this letter to schedule your follow up appointment.  

## 2014-02-07 NOTE — Progress Notes (Signed)
HPI The patient presents for followup of his coronary disease and atrial fibrillation. Since I last saw him he has done well. The patient denies any new symptoms such as chest pressure, neck or arm discomfort. There has been no new shortness of breath, PND or orthopnea. There have been no reported palpitations, presyncope or syncope.  He has had none of the acute shortness of breath that was his previous angina. He's had no new symptoms since stress perfusion testing in 2010.  He is limited by joint pains and is considering another hip surgery. 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  This caused no symptoms.   Allergies  Allergen Reactions  . Penicillins   . Hct [Hydrochlorothiazide] Other (See Comments)    hyper  . Liptruzet [Ezetimibe-Atorvastatin] Other (See Comments)    myalgia  . Statins Other (See Comments)    myalgia    Current Outpatient Prescriptions  Medication Sig Dispense Refill  . acetaminophen (TYLENOL) 650 MG CR tablet Take 650 mg by mouth as needed.        . carvedilol (COREG) 12.5 MG tablet Take 1 tablet (12.5 mg total) by mouth 2 (two) times daily.  60 tablet  4  . Cholecalciferol (VITAMIN D-3 PO) Take 1 tablet by mouth daily.      . diclofenac sodium (VOLTAREN) 1 % GEL Apply 1 gram to hands or other affected joints up to 4 times daily  100 g  2  . diltiazem (CARDIZEM CD) 120 MG 24 hr capsule Take 1 capsule (120 mg total) by mouth daily.  30 capsule  2  . ezetimibe (ZETIA) 10 MG tablet Take 1/2 tablet daily  30 tablet  3  . furosemide (LASIX) 20 MG tablet Take 1 tablet (20 mg total) by mouth daily.  30 tablet  2  . rosuvastatin (CRESTOR) 10 MG tablet Take 10 mg by mouth daily. One half tablet on Monday Wednesday and Friday as tolerated      . warfarin (COUMADIN) 5 MG tablet Take 1 tablet (5 mg total) by mouth daily.  90 tablet  3   No current facility-administered  medications for this visit.    Past Medical History  Diagnosis Date  . Hypertension   . Persistent atrial fibrillation   . Coronary artery disease   . Ejection fraction < 50%     Mildly reduced, 40% by echo  . Anemia   . Cellulitis     In past  . DJD (degenerative joint disease)   . GERD (gastroesophageal reflux disease)   . PUD (peptic ulcer disease)   . Skin cancer of eyelid     Resected    Past Surgical History  Procedure Laterality Date  . Coronary artery bypass graft  4/05    LIMA to LAD, SVG to PDA, SVG to ramus intermediate  . Heart bypass  2005  . Total knee arthroplasty      Right  . Appendectomy    . Eyelid carcinoma excision      Skin cancer resected  . Total hip arthroplasty      Right     ROS:  As stated in the HPI and negative for all other systems.  PHYSICAL EXAM BP 149/89  Pulse 84  Ht 5\' 7"  (1.702 m)  Wt 194 lb (87.998 kg)  BMI 30.38 kg/m2 GENERAL:  Well appearing NECK:  No jugular venous distention, waveform within  normal limits, carotid upstroke brisk and symmetric, no bruits, no thyromegaly, irregular LUNGS:  Clear to auscultation bilaterally BACK:  No CVA tenderness CHEST:  Well healed sternotomy scar. HEART:  PMI not displaced or sustained,S1 and S2 within normal limits, no S3, no clicks, no rubs, no murmurs, irregular ABD:  Flat, positive bowel sounds normal in frequency in pitch, no bruits, no rebound, no guarding, no midline pulsatile mass, no hepatomegaly, no splenomegaly, obese EXT:  2 plus pulses throughout, no edema, no cyanosis no clubbing  EKG:  Atrial fibrillation, rate this is85, early transition in lead V2, nonspecific lateral ST flattening.   12/01/13  ASSESSMENT AND PLAN  [name]:  The patient has no new sypmtoms.  I was planning on doing elective screening was a stress test. However, since his thinking of having hip surgery I will defer this and we will order this preoperatively. If however he hasn't had surgery in the next year  he agrees to having a screening perfusion study. Certainly if he has any symptoms I would have a low threshold for further evaluation. For now he will continue with risk reduction.  HTN:  The blood pressure is slightly elevated in the office but at target at home.Marland Kitchen No change in medications is indicated. We will continue with therapeutic lifestyle changes (TLC).  HYPERLIPIDEMIA:  This is followed closely by Dr. Laurance Flatten.  The patient he is intolerant of statins.  ATRIAL FIBRILLATION:  The patient  tolerates this rhythm and rate control and anticoagulation. We will continue with the meds as listed. Is not interested in switching to another agent.

## 2014-02-27 ENCOUNTER — Ambulatory Visit (INDEPENDENT_AMBULATORY_CARE_PROVIDER_SITE_OTHER): Payer: Medicare Other | Admitting: Family Medicine

## 2014-02-27 ENCOUNTER — Encounter: Payer: Self-pay | Admitting: Family Medicine

## 2014-02-27 VITALS — BP 170/93 | HR 75 | Temp 97.9°F | Ht 67.0 in | Wt 192.0 lb

## 2014-02-27 DIAGNOSIS — D649 Anemia, unspecified: Secondary | ICD-10-CM

## 2014-02-27 DIAGNOSIS — E785 Hyperlipidemia, unspecified: Secondary | ICD-10-CM

## 2014-02-27 DIAGNOSIS — E559 Vitamin D deficiency, unspecified: Secondary | ICD-10-CM

## 2014-02-27 DIAGNOSIS — I2510 Atherosclerotic heart disease of native coronary artery without angina pectoris: Secondary | ICD-10-CM

## 2014-02-27 DIAGNOSIS — Z7901 Long term (current) use of anticoagulants: Secondary | ICD-10-CM

## 2014-02-27 DIAGNOSIS — E8881 Metabolic syndrome: Secondary | ICD-10-CM

## 2014-02-27 DIAGNOSIS — I4891 Unspecified atrial fibrillation: Secondary | ICD-10-CM

## 2014-02-27 DIAGNOSIS — I10 Essential (primary) hypertension: Secondary | ICD-10-CM

## 2014-02-27 DIAGNOSIS — N400 Enlarged prostate without lower urinary tract symptoms: Secondary | ICD-10-CM

## 2014-02-27 DIAGNOSIS — K219 Gastro-esophageal reflux disease without esophagitis: Secondary | ICD-10-CM

## 2014-02-27 LAB — POCT CBC
GRANULOCYTE PERCENT: 53.5 % (ref 37–80)
HCT, POC: 42.5 % — AB (ref 43.5–53.7)
Hemoglobin: 14.2 g/dL (ref 14.1–18.1)
LYMPH, POC: 5.1 — AB (ref 0.6–3.4)
MCH, POC: 32.4 pg — AB (ref 27–31.2)
MCHC: 33.4 g/dL (ref 31.8–35.4)
MCV: 97 fL (ref 80–97)
MPV: 8.4 fL (ref 0–99.8)
PLATELET COUNT, POC: 219 10*3/uL (ref 142–424)
POC Granulocyte: 6.5 (ref 2–6.9)
POC LYMPH PERCENT: 41.8 %L (ref 10–50)
RBC: 4.4 M/uL — AB (ref 4.69–6.13)
RDW, POC: 13.3 %
WBC: 12.2 10*3/uL — AB (ref 4.6–10.2)

## 2014-02-27 LAB — POCT GLYCOSYLATED HEMOGLOBIN (HGB A1C): Hemoglobin A1C: 5.7

## 2014-02-27 LAB — POCT INR: INR: 2.9

## 2014-02-27 NOTE — Patient Instructions (Addendum)
Medicare Annual Wellness Visit  [facility] and the medical providers at [facility] strive to bring you the best medical care.  In doing so we not only want to address your current medical conditions and concerns but also to detect new conditions early and prevent illness, disease and health-related problems.    Medicare offers a yearly Wellness Visit which allows our clinical staff to assess your need for preventative services including immunizations, lifestyle education, counseling to decrease risk of preventable diseases and screening for fall risk and other medical concerns.    This visit is provided free of charge (no copay) for all Medicare recipients. The clinical pharmacists at [facility] have begun to conduct these Wellness Visits which will also include a thorough review of all your medications.    As you primary medical provider recommend that you make an appointment for your Annual Wellness Visit if you have not done so already this year.  You may set up this appointment before you leave today or you may call back ([phone]) and schedule an appointment.  Please make sure when you call that you mention that you are scheduling your Annual Wellness Visit with the clinical pharmacist so that the appointment may be made for the proper length of time.     Continue current medications. Continue good therapeutic lifestyle changes which include good diet and exercise. Fall precautions discussed with patient. If an FOBT was given today- please return it to our front desk. If you are over 39 years old - you may need Prevnar 46 or the adult Pneumonia vaccine.  Check blood pressures at home and return readings for review in about 4 weeks Continue to drink plenty of water and continue to watch her sodium intake we will call you with the results of the lab work once those results are available we will send the results of your PSA to  the urologist

## 2014-02-27 NOTE — Progress Notes (Signed)
Subjective:    Patient ID: [name], male    DOB: 03/11/1932, 80 y.o.   MRN: [id number]  HPI Pt here for follow up and management of chronic medical problems. The patient indicates that his blood pressures run in the 130s over the 90s at home. When he saw a cardiologist a couple weeks ago he said his blood pressure was good. He denies the intake of salt and says he gets plenty of physical activity. He is due to get his protime today and lab work today. We will also get a PSA and this will be sent to his urologist.         Patient Active Problem List   Diagnosis Date Noted  . Long term (current) use of anticoagulants 02/14/2013  . OVERWEIGHT 12/11/2009  . ANEMIA 02/25/2009  . HYPERTENSION 02/25/2009  . [name] 02/25/2009  . FIBRILLATION, ATRIAL 02/25/2009  . GERD 02/25/2009  . DEGENERATIVE JOINT DISEASE 02/25/2009   Outpatient Encounter Prescriptions as of 02/27/2014  Medication Sig  . acetaminophen (TYLENOL) 650 MG CR tablet Take 650 mg by mouth as needed.    . carvedilol (COREG) 12.5 MG tablet Take 1 tablet (12.5 mg total) by mouth 2 (two) times daily.  . Cholecalciferol (VITAMIN D-3 PO) Take 1 tablet by mouth daily.  . diclofenac sodium (VOLTAREN) 1 % GEL Apply 1 gram to hands or other affected joints up to 4 times daily  . diltiazem (CARDIZEM CD) 120 MG 24 hr capsule Take 1 capsule (120 mg total) by mouth daily.  . furosemide (LASIX) 20 MG tablet Take 1 tablet (20 mg total) by mouth daily.  . rosuvastatin (CRESTOR) 10 MG tablet Take 10 mg by mouth daily. One half tablet on Monday Wednesday and Friday as tolerated  . warfarin (COUMADIN) 5 MG tablet Take 1 tablet (5 mg total) by mouth daily.  . [DISCONTINUED] ezetimibe (ZETIA) 10 MG tablet Take 1/2 tablet daily    Review of Systems  Constitutional: Negative.   HENT: Negative.   Eyes: Negative.   Respiratory: Negative.   Cardiovascular: Negative.   Gastrointestinal: Negative.   Endocrine: Negative.   Genitourinary:  Negative.   Musculoskeletal: Negative.   Skin: Negative.   Allergic/Immunologic: Negative.   Neurological: Negative.   Hematological: Negative.   Psychiatric/Behavioral: Negative.        Objective:   Physical Exam  Nursing note and vitals reviewed. Constitutional: He is oriented to person, place, and time. He appears well-developed and well-nourished. No distress.  HENT:  Head: Normocephalic and atraumatic.  Right Ear: External ear normal.  Left Ear: External ear normal.  Mouth/Throat: Oropharynx is clear and moist. No oropharyngeal exudate.  Slight nasal congestion bilaterally  Eyes: Conjunctivae and EOM are normal. Pupils are equal, round, and reactive to light. Right eye exhibits no discharge. Left eye exhibits no discharge. No scleral icterus.  Neck: Normal range of motion. Neck supple. No thyromegaly present.  Cardiovascular: Normal rate, normal heart sounds and intact distal pulses.   No murmur heard. The heart is slightly irregular at about 72 per minute  Pulmonary/Chest: Effort normal and breath sounds normal. No respiratory distress. He has no wheezes. He has no rales. He exhibits no tenderness.  Abdominal: Soft. Bowel sounds are normal. He exhibits no mass. There is no tenderness. There is no rebound and no guarding.  Genitourinary:  The prostate is followed by his urologist Dr. Jeffie Pollock  Musculoskeletal: Normal range of motion. He exhibits no edema.  Lymphadenopathy:    He has no  cervical adenopathy.  Neurological: He is alert and oriented to person, place, and time. He has normal reflexes. No cranial nerve deficit.  Skin: Skin is warm and dry. No rash noted. No erythema. No pallor.  Psychiatric: He has a normal mood and affect. His behavior is normal. Judgment and thought content normal.   BP 146/91  Pulse 77  Temp(Src) 97.9 F (36.6 C) (Oral)  Ht 5' 7"  (1.702 m)  Wt 192 lb (87.091 kg)  BMI 30.06 kg/m2        Assessment & Plan:  1. ANEMIA - POCT CBC  2.  [name] - POCT CBC  3. FIBRILLATION, ATRIAL - POCT CBC - POCT INR  4. GERD - POCT CBC  5. HYPERTENSION - POCT CBC - BMP8+EGFR - Hepatic function panel  6. Metabolic syndrome - POCT CBC - POCT glycosylated hemoglobin (Hb A1C)  7. Vitamin D deficiency - POCT CBC - Vit D  25 hydroxy (rtn osteoporosis monitoring)  8. [name] (benign prostatic hyperplasia) - POCT CBC - PSA, total and free  9. Hyperlipidemia - POCT CBC - NMR, lipoprofile  10. Atrial fibrillation - POCT CBC - POCT INR  11. Long term (current) use of anticoagulants   Patient Instructions                       Medicare Annual Wellness Visit  [facility] and the medical providers at [facility] strive to bring you the best medical care.  In doing so we not only want to address your current medical conditions and concerns but also to detect new conditions early and prevent illness, disease and health-related problems.    Medicare offers a yearly Wellness Visit which allows our clinical staff to assess your need for preventative services including immunizations, lifestyle education, counseling to decrease risk of preventable diseases and screening for fall risk and other medical concerns.    This visit is provided free of charge (no copay) for all Medicare recipients. The clinical pharmacists at [facility] have begun to conduct these Wellness Visits which will also include a thorough review of all your medications.    As you primary medical provider recommend that you make an appointment for your Annual Wellness Visit if you have not done so already this year.  You may set up this appointment before you leave today or you may call back ([phone]) and schedule an appointment.  Please make sure when you call that you mention that you are scheduling your Annual Wellness Visit with the clinical pharmacist so that the appointment may be made for the proper length of time.      Continue current medications. Continue good therapeutic lifestyle changes which include good diet and exercise. Fall precautions discussed with patient. If an FOBT was given today- please return it to our front desk. If you are over 62 years old - you may need Prevnar 63 or the adult Pneumonia vaccine.  Check blood pressures at home and return readings for review in about 4 weeks Continue to drink plenty of water and continue to watch her sodium intake we will call you with the results of the lab work once those results are available we will send the results of your PSA to the urologist     Arrie Senate MD

## 2014-02-28 LAB — HEPATIC FUNCTION PANEL
ALBUMIN: 4.4 g/dL (ref 3.5–4.8)
ALT: 10 IU/L (ref 0–44)
AST: 17 IU/L (ref 0–40)
Alkaline Phosphatase: 55 IU/L (ref 39–117)
Bilirubin, Direct: 0.13 mg/dL (ref 0.00–0.40)
Total Bilirubin: 0.5 mg/dL (ref 0.0–1.2)
Total Protein: 6.9 g/dL (ref 6.0–8.5)

## 2014-02-28 LAB — BMP8+EGFR
BUN/Creatinine Ratio: 12 (ref 10–22)
BUN: 13 mg/dL (ref 8–27)
CHLORIDE: 104 mmol/L (ref 97–108)
CO2: 24 mmol/L (ref 18–29)
Calcium: 9.6 mg/dL (ref 8.6–10.2)
Creatinine, Ser: 1.05 mg/dL (ref 0.76–1.27)
GFR calc Af Amer: 78 mL/min/{1.73_m2} (ref >59)
GFR calc non Af Amer: 67 mL/min/{1.73_m2} (ref >59)
Glucose: 100 mg/dL — ABNORMAL HIGH (ref 65–99)
Potassium: 4.8 mmol/L (ref 3.5–5.2)
Sodium: 144 mmol/L (ref 134–144)

## 2014-02-28 LAB — VITAMIN D 25 HYDROXY (VIT D DEFICIENCY, FRACTURES): Vit D, 25-Hydroxy: 40.7 ng/mL (ref 30.0–100.0)

## 2014-02-28 LAB — NMR, LIPOPROFILE
CHOLESTEROL: 194 mg/dL (ref 100–199)
HDL Cholesterol by NMR: 48 mg/dL (ref >39)
HDL PARTICLE NUMBER: 24.1 umol/L — AB (ref >=30.5)
LDL Particle Number: 1475 nmol/L — ABNORMAL HIGH (ref <1000)
LDL Size: 21.3 nm (ref >20.5)
LDLC SERPL CALC-MCNC: 123 mg/dL — ABNORMAL HIGH (ref 0–99)
LP-IR Score: <25 (ref <=45)
Small LDL Particle Number: 394 nmol/L (ref <=527)
Triglycerides by NMR: 114 mg/dL (ref 0–149)

## 2014-02-28 LAB — PSA, TOTAL AND FREE
PSA, Free Pct: 25.2 %
PSA, Free: 2.87 ng/mL
PSA: 11.4 ng/mL — ABNORMAL HIGH (ref 0.0–4.0)

## 2014-03-01 ENCOUNTER — Telehealth: Payer: Self-pay | Admitting: Family Medicine

## 2014-03-01 NOTE — Telephone Encounter (Signed)
Message copied by Cline Crock on Thu Mar 01, 2014  2:51 PM ------      Message from: Zannie Cove      Created: Thu Mar 01, 2014  9:27 AM                   ----- Message -----         From: Chipper Herb, MD         Sent: 02/28/2014   5:21 PM           To: Cameron Proud Bullins, LPN, Ilean China, RN            The blood sugar slightly elevated at 100. The creatinine, the most important kidney function test is within normal limits. The electrolytes including potassium are within normal limits.      All liver function tests are within normal limits      With advanced lipid testing, the total LDL particle number remains elevated at 1475. The LDL C. is elevated at 123. The triglycerides are good. The good cholesterol or the HDL particle number is low. The patient is intolerant to most statins but apparently is tolerant to Crestor. Make sure he is taking this regularly. If he does and he tolerates it well it should be increased to 20 mg daily-----please confirm this with patient before calling a prescription in for him for the higher strength. He would need to have liver function tests done 4 weeks after increasing the Crestor      The PSA remains elevated at 11.4. This is very elevated. Please make sure that patient is seeing the urologist regularly because of the elevated PSA this is very very important!!!!!!!!!!+++++++++      The vitamin D is good and within normal limits continue current treatment ------

## 2014-03-01 NOTE — Progress Notes (Signed)
Quick Note:  No answer ______

## 2014-03-08 NOTE — Telephone Encounter (Signed)
Patient has follow up with urology. He can not increase crestor because he is barely tolerating it.

## 2014-03-08 NOTE — Telephone Encounter (Signed)
Message copied by Shelbie Ammons on Thu Mar 08, 2014  3:29 PM ------      Message from: Zannie Cove      Created: Thu Mar 01, 2014  9:27 AM                   ----- Message -----         From: Chipper Herb, MD         Sent: 02/28/2014   5:21 PM           To: Cameron Proud Bullins, LPN, Ilean China, RN            The blood sugar slightly elevated at 100. The creatinine, the most important kidney function test is within normal limits. The electrolytes including potassium are within normal limits.      All liver function tests are within normal limits      With advanced lipid testing, the total LDL particle number remains elevated at 1475. The LDL C. is elevated at 123. The triglycerides are good. The good cholesterol or the HDL particle number is low. The patient is intolerant to most statins but apparently is tolerant to Crestor. Make sure he is taking this regularly. If he does and he tolerates it well it should be increased to 20 mg daily-----please confirm this with patient before calling a prescription in for him for the higher strength. He would need to have liver function tests done 4 weeks after increasing the Crestor      The PSA remains elevated at 11.4. This is very elevated. Please make sure that patient is seeing the urologist regularly because of the elevated PSA this is very very important!!!!!!!!!!+++++++++      The vitamin D is good and within normal limits continue current treatment ------

## 2014-04-03 ENCOUNTER — Ambulatory Visit (INDEPENDENT_AMBULATORY_CARE_PROVIDER_SITE_OTHER): Payer: Medicare Other | Admitting: Pharmacist Clinician (PhC)/ Clinical Pharmacy Specialist

## 2014-04-03 DIAGNOSIS — Z7901 Long term (current) use of anticoagulants: Secondary | ICD-10-CM

## 2014-04-03 DIAGNOSIS — I482 Chronic atrial fibrillation, unspecified: Secondary | ICD-10-CM

## 2014-04-03 DIAGNOSIS — I4891 Unspecified atrial fibrillation: Secondary | ICD-10-CM

## 2014-04-03 LAB — POCT INR: INR: 2.1

## 2014-04-23 ENCOUNTER — Other Ambulatory Visit: Payer: Self-pay | Admitting: Family Medicine

## 2014-05-25 ENCOUNTER — Ambulatory Visit (INDEPENDENT_AMBULATORY_CARE_PROVIDER_SITE_OTHER): Payer: Medicare Other | Admitting: Pharmacist

## 2014-05-25 ENCOUNTER — Encounter: Payer: Self-pay | Admitting: Pharmacist

## 2014-05-25 VITALS — BP 142/90 | HR 64 | Ht 67.0 in | Wt 195.0 lb

## 2014-05-25 DIAGNOSIS — Z0000 Encounter for general adult medical examination without abnormal findings: Secondary | ICD-10-CM

## 2014-05-25 DIAGNOSIS — Z7901 Long term (current) use of anticoagulants: Secondary | ICD-10-CM

## 2014-05-25 DIAGNOSIS — I482 Chronic atrial fibrillation, unspecified: Secondary | ICD-10-CM

## 2014-05-25 DIAGNOSIS — I4891 Unspecified atrial fibrillation: Secondary | ICD-10-CM

## 2014-05-25 DIAGNOSIS — I4819 Other persistent atrial fibrillation: Secondary | ICD-10-CM

## 2014-05-25 DIAGNOSIS — H269 Unspecified cataract: Secondary | ICD-10-CM | POA: Insufficient documentation

## 2014-05-25 DIAGNOSIS — H9193 Unspecified hearing loss, bilateral: Secondary | ICD-10-CM

## 2014-05-25 LAB — POCT INR: INR: 2.7

## 2014-05-25 MED ORDER — FLUTICASONE PROPIONATE 50 MCG/ACT NA SUSP
NASAL | Status: DC
Start: 2014-05-25 — End: 2015-01-23

## 2014-05-25 NOTE — Progress Notes (Signed)
Subjective:    [name] is a 80 y.o. male who presents for Medicare Initial Wellness Visit and recheck protime  Preventive Screening-Counseling & Management  Tobacco History  Smoking status  . Former Smoker  . Quit date: 12/23/1963  Smokeless tobacco  . Never Used   Current Problems (verified) Patient Active Problem List   Diagnosis Date Noted  . Long term (current) use of anticoagulants 02/14/2013  . OVERWEIGHT 12/11/2009  . ANEMIA 02/25/2009  . HYPERTENSION 02/25/2009  . [name] 02/25/2009  . FIBRILLATION, ATRIAL 02/25/2009  . GERD 02/25/2009  . DEGENERATIVE JOINT DISEASE 02/25/2009    Medications Prior to Visit Current Outpatient Prescriptions on File Prior to Visit  Medication Sig Dispense Refill  . acetaminophen (TYLENOL) 650 MG CR tablet Take 650 mg by mouth as needed.        . carvedilol (COREG) 12.5 MG tablet TAKE ONE TABLET BY MOUTH TWICE DAILY  60 tablet  4  . Cholecalciferol (VITAMIN D-3 PO) Take 1 tablet by mouth daily.      . diclofenac sodium (VOLTAREN) 1 % GEL Apply 1 gram to hands or other affected joints up to 4 times daily  100 g  2  . diltiazem (CARDIZEM CD) 120 MG 24 hr capsule Take 1 capsule (120 mg total) by mouth daily.  30 capsule  2  . furosemide (LASIX) 20 MG tablet Take 1 tablet (20 mg total) by mouth daily.  30 tablet  2  . rosuvastatin (CRESTOR) 10 MG tablet Take 10 mg by mouth daily. One tablet on Monday and Friday as tolerated      . warfarin (COUMADIN) 5 MG tablet Take 1 tablet (5 mg total) by mouth daily.  90 tablet  3   No current facility-administered medications on file prior to visit.    Current Medications (verified) Current Outpatient Prescriptions  Medication Sig Dispense Refill  . acetaminophen (TYLENOL) 650 MG CR tablet Take 650 mg by mouth as needed.        . carvedilol (COREG) 12.5 MG tablet TAKE ONE TABLET BY MOUTH TWICE DAILY  60 tablet  4  . Cholecalciferol (VITAMIN D-3 PO) Take 1 tablet by mouth daily.      . diclofenac  sodium (VOLTAREN) 1 % GEL Apply 1 gram to hands or other affected joints up to 4 times daily  100 g  2  . diltiazem (CARDIZEM CD) 120 MG 24 hr capsule Take 1 capsule (120 mg total) by mouth daily.  30 capsule  2  . furosemide (LASIX) 20 MG tablet Take 1 tablet (20 mg total) by mouth daily.  30 tablet  2  . rosuvastatin (CRESTOR) 10 MG tablet Take 10 mg by mouth daily. One tablet on Monday and Friday as tolerated      . warfarin (COUMADIN) 5 MG tablet Take 1 tablet (5 mg total) by mouth daily.  90 tablet  3  . fluticasone (FLONASE) 50 MCG/ACT nasal spray Use 1 to 2 sprays in each nostril daily  16 g  2   No current facility-administered medications for this visit.     Allergies (verified) Penicillins; Hct; Liptruzet; and Statins   PAST HISTORY  Family History Family History  Problem Relation Age of Onset  . Stroke Mother   . Stroke Father   . Hypertension Father   . Early death Brother     61 months old  . Cancer Brother     lung  . Stroke Brother     heat  Social History History  Substance Use Topics  . Smoking status: Former Smoker    Quit date: 12/23/1963  . Smokeless tobacco: Never Used  . Alcohol Use: No    Are there smokers in your home (other than you)?  No  Risk Factors Current exercise habits: Home exercise routine includes walking and bowling weekly.  Dietary issues discussed: limiting fat and CHO   Cardiac risk factors: advanced age (older than 75 for men, 58 for women), dyslipidemia, family history of premature cardiovascular disease, hypertension, male gender and obesity (BMI >= 30 kg/m2).  Depression Screen (Note: if answer to either of the following is "Yes", a more complete depression screening is indicated)   Q1: Over the past two weeks, have you felt down, depressed or hopeless? No  Q2: Over the past two weeks, have you felt little interest or pleasure in doing things? No  Have you lost interest or pleasure in daily life? No  Do you often feel  hopeless? No  Do you cry easily over simple problems? No  Activities of Daily Living In your present state of health, do you have any difficulty performing the following activities?:  Driving? No Managing money?  No Feeding yourself? No Getting from bed to chair? No Climbing a flight of stairs? No Preparing food and eating?: No Bathing or showering? No Getting dressed: No Getting to the toilet? No Using the toilet:No Moving around from place to place: No In the past year have you fallen or had a near fall?:No   Are you sexually active?  Yes  Do you have more than one partner?  No  Hearing Difficulties: Yes Do you often ask people to speak up or repeat themselves? Yes Do you experience ringing or noises in your ears? No Do you have difficulty understanding soft or whispered voices? No   Do you feel that you have a problem with memory? No  Do you often misplace items? No  Trouble remembering names  Do you feel safe at home?  Yes  Cognitive Testing  Alert? Yes  Normal Appearance?Yes  Oriented to person? Yes  Place? Yes   Time? Yes  Recall of three objects?  Yes  Can perform simple calculations? Yes  Displays appropriate judgment?Yes  Can read the correct time from a watch face?Yes   Advanced Directives have been discussed with the patient? Yes   List the Names of Other Physician/Practitioners you currently use: 1.  Eye - Okey Regal 2.  GI - Rehman 3. Cardiologist - Hocherin 4.  Urologist - Jeffie Pollock  Indicate any recent Medical Services you may have received from other than [facility] providers in the past year (date may be approximate).  Immunization History  Administered Date(s) Administered  . Influenza,inj,Quad PF,36+ Mos 07/17/2013  . Pneumococcal Conjugate-13 10/30/2013    Screening Tests Health Maintenance  Topic Date Due  . Zostavax  08/31/1994  . Influenza Vaccine  05/12/2014  . Colonoscopy  10/31/2015 (Originally 10/12/2013)  . Tetanus/tdap  10/12/2021  .  Pneumococcal Polysaccharide Vaccine Age 66 And Over  Completed   Eye Exam - Dr Hassell Done 03/2014 Tonometry  / Glaucoma testing:  Use Tonopen  OS:   36mmHg          OD:  61mmHg All answers were reviewed with the patient and necessary referrals were made:  Cherre Robins, [facility]   05/25/2014   History reviewed: allergies, current medications, past family history, past medical history, past social history, past surgical history and problem list  Objective:     Blood pressure 142/90, pulse 64, height 5\' 7"  (1.702 m), weight 195 lb (88.451 kg). Body mass index is 30.53 kg/(m^2).  INR was 2.7 today   Assessment:    Initial Medicare Wellness Visit Therapeutic anticoagulation Elevated PSA     Plan:     During the course of the visit the patient was educated and counseled about appropriate screening and preventive services including:    Pneumococcal vaccine - UTD  Influenza vaccine - UTD  Td vaccine - UTD  Screening electrocardiogram- UTD  Prostate cancer screening - PSA was elevated when last checked - patient advised again to follow up with Dr Jeffie Pollock - he declined  Colorectal cancer screening  Diabetes screening - UTD  Glaucoma screening - UTD  Advanced directives: caring connections packet given  Referral to audiologist  Nutrition referral declined  Zostavax declined today due to cost ($182.31)  Trial of Flonase in place of nasal decongestant recommended  Anticoagulation Dose Instructions as of 05/25/2014     Dorene Grebe Tue Wed Thu Fri Sat   New Dose 5 mg 2.5 mg 5 mg 5 mg 5 mg 2.5 mg 5 mg    Description       Continue 1/2 tablet on Mondays and Fridays and 1 tablet all other days     RTC in 6 weeks to check INR  Patient Instructions (the written plan) was given to the patient.  Medicare Attestation I have personally reviewed: The patient's medical and social history Their use of alcohol, tobacco or illicit drugs Their current medications and  supplements The patient's functional ability including ADLs,fall risks, home safety risks, cognitive, and hearing and visual impairment Diet and physical activities Evidence for depression or mood disorders  The patient's weight, height, BMI, and HR/BP have been recorded in the chart.  I have made referrals, counseling, and provided education to the patient based on review of the above and I have provided the patient with a written personalized care plan for preventive services.     Cherre Robins, [facility]   05/25/2014

## 2014-05-25 NOTE — Patient Instructions (Addendum)
Anticoagulation Dose Instructions as of 05/25/2014     Dorene Grebe Tue Wed Thu Fri Sat   New Dose 5 mg 2.5 mg 5 mg 5 mg 5 mg 2.5 mg 5 mg    Description       Continue 1/2 tablet on Mondays and Fridays and 1 tablet all other days      INR was 2.7 today   Health Maintenance Summary    ZOSTAVAX Overdue 08/31/1994  Checked price today - $182.30    INFLUENZA VACCINE Next Due 05/12/2014     Pneumonia Completed  2015    Eye Exam Next Due  2016 Last 02/2014    COLONOSCOPY Due Now  Last 2015    TETANUS/TDAP Next Due 10/12/2021  Last 2013      PSA (2015) = 11.5 PSA (2014) = 10.56 PSA (11/13) = 11.29 PSA (7/13) = 11.90  Preventive Care for Adults A healthy lifestyle and preventive care can promote health and wellness. Preventive health guidelines for men include the following key practices:  A routine yearly physical is a good way to check with your health care provider about your health and preventative screening. It is a chance to share any concerns and updates on your health and to receive a thorough exam.  Visit your dentist for a routine exam and preventative care every 6 months. Brush your teeth twice a day and floss once a day. Good oral hygiene prevents tooth decay and gum disease.  The frequency of eye exams is based on your age, health, family medical history, use of contact lenses, and other factors. Follow your health care provider's recommendations for frequency of eye exams.  Eat a healthy diet. Foods such as vegetables, fruits, whole grains, low-fat dairy products, and lean protein foods contain the nutrients you need without too many calories. Decrease your intake of foods high in solid fats, added sugars, and salt. Eat the right amount of calories for you.Get information about a proper diet from your health care provider, if necessary.  Regular physical exercise is one of the most important things you can do for your health. Most adults should get at least 150 minutes of  moderate-intensity exercise (any activity that increases your heart rate and causes you to sweat) each week. In addition, most adults need muscle-strengthening exercises on 2 or more days a week.  Maintain a healthy weight. The body mass index (BMI) is a screening tool to identify possible weight problems. It provides an estimate of body fat based on height and weight. Your health care provider can find your BMI and can help you achieve or maintain a healthy weight.For adults 20 years and older:  A BMI below 18.5 is considered underweight.  A BMI of 18.5 to 24.9 is normal.  A BMI of 25 to 29.9 is considered overweight.  A BMI of 30 and above is considered obese.  Maintain normal blood lipids and cholesterol levels by exercising and minimizing your intake of saturated fat. Eat a balanced diet with plenty of fruit and vegetables. Blood tests for lipids and cholesterol should begin at age 4 and be repeated every 5 years. If your lipid or cholesterol levels are high, you are over 50, or you are at high risk for heart disease, you may need your cholesterol levels checked more frequently.Ongoing high lipid and cholesterol levels should be treated with medicines if diet and exercise are not working.  If you smoke, find out from your health care provider how to quit. If  you do not use tobacco, do not start.  Lung cancer screening is recommended for adults aged 32-80 years who are at high risk for developing lung cancer because of a history of smoking. A yearly low-dose CT scan of the lungs is recommended for people who have at least a 30-pack-year history of smoking and are a current smoker or have quit within the past 15 years. A pack year of smoking is smoking an average of 1 pack of cigarettes a day for 1 year (for example: 1 pack a day for 30 years or 2 packs a day for 15 years). Yearly screening should continue until the smoker has stopped smoking for at least 15 years. Yearly screening should be  stopped for people who develop a health problem that would prevent them from having lung cancer treatment.  If you choose to drink alcohol, do not have more than 2 drinks per day. One drink is considered to be 12 ounces (355 mL) of beer, 5 ounces (148 mL) of wine, or 1.5 ounces (44 mL) of liquor.  Avoid use of street drugs. Do not share needles with anyone. Ask for help if you need support or instructions about stopping the use of drugs.  High blood pressure causes heart disease and increases the risk of stroke. Your blood pressure should be checked at least every 1-2 years. Ongoing high blood pressure should be treated with medicines, if weight loss and exercise are not effective.  If you are 10-75 years old, ask your health care provider if you should take aspirin to prevent heart disease.  Diabetes screening involves taking a blood sample to check your fasting blood sugar level. This should be done once every 3 years, after age 42, if you are within normal weight and without risk factors for diabetes. Testing should be considered at a younger age or be carried out more frequently if you are overweight and have at least 1 risk factor for diabetes.  Colorectal cancer can be detected and often prevented. Most routine colorectal cancer screening begins at the age of 36 and continues through age 21. However, your health care provider may recommend screening at an earlier age if you have risk factors for colon cancer. On a yearly basis, your health care provider may provide home test kits to check for hidden blood in the stool. Use of a small camera at the end of a tube to directly examine the colon (sigmoidoscopy or colonoscopy) can detect the earliest forms of colorectal cancer. Talk to your health care provider about this at age 53, when routine screening begins. Direct exam of the colon should be repeated every 5-10 years through age 10, unless early forms of precancerous polyps or small growths are  found.  People who are at an increased risk for hepatitis B should be screened for this virus. You are considered at high risk for hepatitis B if:  You were born in a country where hepatitis B occurs often. Talk with your health care provider about which countries are considered high risk.  Your parents were born in a high-risk country and you have not received a shot to protect against hepatitis B (hepatitis B vaccine).  You have HIV or AIDS.  You use needles to inject street drugs.  You live with, or have sex with, someone who has hepatitis B.  You are a man who has sex with other men (MSM).  You get hemodialysis treatment.  You take certain medicines for conditions such as cancer,  organ transplantation, and autoimmune conditions.  Hepatitis C blood testing is recommended for all people born from [birth year] through 1965 and any individual with known risks for hepatitis C.  Practice safe sex. Use condoms and avoid high-risk sexual practices to reduce the spread of sexually transmitted infections (STIs). STIs include gonorrhea, chlamydia, syphilis, trichomonas, herpes, HPV, and human immunodeficiency virus (HIV). Herpes, HIV, and HPV are viral illnesses that have no cure. They can result in disability, cancer, and death.  If you are at risk of being infected with HIV, it is recommended that you take a prescription medicine daily to prevent HIV infection. This is called preexposure prophylaxis (PrEP). You are considered at risk if:  You are a man who has sex with other men (MSM) and have other risk factors.  You are a heterosexual man, are sexually active, and are at increased risk for HIV infection.  You take drugs by injection.  You are sexually active with a partner who has HIV.  Talk with your health care provider about whether you are at high risk of being infected with HIV. If you choose to begin PrEP, you should first be tested for HIV. You should then be tested every 3 months for  as long as you are taking PrEP.  A one-time screening for abdominal aortic aneurysm (AAA) and surgical repair of large AAAs by ultrasound are recommended for men ages 15 to 51 years who are current or former smokers.  Healthy men should no longer receive prostate-specific antigen (PSA) blood tests as part of routine cancer screening. Talk with your health care provider about prostate cancer screening.  Testicular cancer screening is not recommended for adult males who have no symptoms. Screening includes self-exam, a health care provider exam, and other screening tests. Consult with your health care provider about any symptoms you have or any concerns you have about testicular cancer.  Use sunscreen. Apply sunscreen liberally and repeatedly throughout the day. You should seek shade when your shadow is shorter than you. Protect yourself by wearing long sleeves, pants, a wide-brimmed hat, and sunglasses year round, whenever you are outdoors.  Once a month, do a whole-body skin exam, using a mirror to look at the skin on your back. Tell your health care provider about new moles, moles that have irregular borders, moles that are larger than a pencil eraser, or moles that have changed in shape or color.  Stay current with required vaccines (immunizations).  Influenza vaccine. All adults should be immunized every year.  Tetanus, diphtheria, and acellular pertussis (Td, Tdap) vaccine. An adult who has not previously received Tdap or who does not know his vaccine status should receive 1 dose of Tdap. This initial dose should be followed by tetanus and diphtheria toxoids (Td) booster doses every 10 years. Adults with an unknown or incomplete history of completing a 3-dose immunization series with Td-containing vaccines should begin or complete a primary immunization series including a Tdap dose. Adults should receive a Td booster every 10 years.  Varicella vaccine. An adult without evidence of immunity to  varicella should receive 2 doses or a second dose if he has previously received 1 dose.  Human papillomavirus (HPV) vaccine. Males aged 47-21 years who have not received the vaccine previously should receive the 3-dose series. Males aged 22-26 years may be immunized. Immunization is recommended through the age of 10 years for any male who has sex with males and did not get any or all doses earlier. Immunization is recommended for  any person with an immunocompromised condition through the age of 29 years if he did not get any or all doses earlier. During the 3-dose series, the second dose should be obtained 4-8 weeks after the first dose. The third dose should be obtained 24 weeks after the first dose and 16 weeks after the second dose.  Zoster vaccine. One dose is recommended for adults aged 4 years or older unless certain conditions are present.  Measles, mumps, and rubella (MMR) vaccine. Adults born before [birth year] generally are considered immune to measles and mumps. Adults born in [birth year] or later should have 1 or more doses of MMR vaccine unless there is a contraindication to the vaccine or there is laboratory evidence of immunity to each of the three diseases. A routine second dose of MMR vaccine should be obtained at least 28 days after the first dose for students attending postsecondary schools, health care workers, or international travelers. People who received inactivated measles vaccine or an unknown type of measles vaccine during 1963-1967 should receive 2 doses of MMR vaccine. People who received inactivated mumps vaccine or an unknown type of mumps vaccine before 1979 and are at high risk for mumps infection should consider immunization with 2 doses of MMR vaccine. Unvaccinated health care workers born before [birth year] who lack laboratory evidence of measles, mumps, or rubella immunity or laboratory confirmation of disease should consider measles and mumps immunization with 2 doses of MMR vaccine or  rubella immunization with 1 dose of MMR vaccine.  Pneumococcal 13-valent conjugate (PCV13) vaccine. When indicated, a person who is uncertain of his immunization history and has no record of immunization should receive the PCV13 vaccine. An adult aged 40 years or older who has certain medical conditions and has not been previously immunized should receive 1 dose of PCV13 vaccine. This PCV13 should be followed with a dose of pneumococcal polysaccharide (PPSV23) vaccine. The PPSV23 vaccine dose should be obtained at least 8 weeks after the dose of PCV13 vaccine. An adult aged 43 years or older who has certain medical conditions and previously received 1 or more doses of PPSV23 vaccine should receive 1 dose of PCV13. The PCV13 vaccine dose should be obtained 1 or more years after the last PPSV23 vaccine dose.  Pneumococcal polysaccharide (PPSV23) vaccine. When PCV13 is also indicated, PCV13 should be obtained first. All adults aged 33 years and older should be immunized. An adult younger than age 64 years who has certain medical conditions should be immunized. Any person who resides in a nursing home or long-term care facility should be immunized. An adult smoker should be immunized. People with an immunocompromised condition and certain other conditions should receive both PCV13 and PPSV23 vaccines. People with human immunodeficiency virus (HIV) infection should be immunized as soon as possible after diagnosis. Immunization during chemotherapy or radiation therapy should be avoided. Routine use of PPSV23 vaccine is not recommended for American Indians, [place] Natives, or people younger than 65 years unless there are medical conditions that require PPSV23 vaccine. When indicated, people who have unknown immunization and have no record of immunization should receive PPSV23 vaccine. One-time revaccination 5 years after the first dose of PPSV23 is recommended for people aged 19-64 years who have chronic kidney  failure, nephrotic syndrome, asplenia, or immunocompromised conditions. People who received 1-2 doses of PPSV23 before age 56 years should receive another dose of PPSV23 vaccine at age 49 years or later if at least 5 years have passed since the previous dose. Doses of PPSV23  are not needed for people immunized with PPSV23 at or after age 52 years.  Meningococcal vaccine. Adults with asplenia or persistent complement component deficiencies should receive 2 doses of quadrivalent meningococcal conjugate (MenACWY-D) vaccine. The doses should be obtained at least 2 months apart. Microbiologists working with certain meningococcal bacteria, [place] recruits, people at risk during an outbreak, and people who travel to or live in countries with a high rate of meningitis should be immunized. A first-year college student up through age 34 years who is living in a residence hall should receive a dose if he did not receive a dose on or after his 16th birthday. Adults who have certain high-risk conditions should receive one or more doses of vaccine.  Hepatitis A vaccine. Adults who wish to be protected from this disease, have certain high-risk conditions, work with hepatitis A-infected animals, work in hepatitis A research labs, or travel to or work in countries with a high rate of hepatitis A should be immunized. Adults who were previously unvaccinated and who anticipate close contact with an international adoptee during the first 60 days after arrival in the [place] from a country with a high rate of hepatitis A should be immunized.  Hepatitis B vaccine. Adults should be immunized if they wish to be protected from this disease, have certain high-risk conditions, may be exposed to blood or other infectious body fluids, are household contacts or sex partners of hepatitis B positive people, are clients or workers in certain care facilities, or travel to or work in countries with a high rate of hepatitis  B.  Haemophilus influenzae type b (Hib) vaccine. A previously unvaccinated person with asplenia or sickle cell disease or having a scheduled splenectomy should receive 1 dose of Hib vaccine. Regardless of previous immunization, a recipient of a hematopoietic stem cell transplant should receive a 3-dose series 6-12 months after his successful transplant. Hib vaccine is not recommended for adults with HIV infection. Preventive Service / Frequency Ages 45 to 15  Blood pressure check.** / Every 1 to 2 years.  Lipid and cholesterol check.** / Every 5 years beginning at age 33.  Hepatitis C blood test.** / For any individual with known risks for hepatitis C.  Skin self-exam. / Monthly.  Influenza vaccine. / Every year.  Tetanus, diphtheria, and acellular pertussis (Tdap, Td) vaccine.** / Consult your health care provider. 1 dose of Td every 10 years.  Varicella vaccine.** / Consult your health care provider.  HPV vaccine. / 3 doses over 6 months, if 62 or younger.  Measles, mumps, rubella (MMR) vaccine.** / You need at least 1 dose of MMR if you were born in 1957 or later. You may also need a second dose.  Pneumococcal 13-valent conjugate (PCV13) vaccine.** / Consult your health care provider.  Pneumococcal polysaccharide (PPSV23) vaccine.** / 1 to 2 doses if you smoke cigarettes or if you have certain conditions.  Meningococcal vaccine.** / 1 dose if you are age 65 to 44 years and a Market researcher living in a residence hall, or have one of several medical conditions. You may also need additional booster doses.  Hepatitis A vaccine.** / Consult your health care provider.  Hepatitis B vaccine.** / Consult your health care provider.  Ages 56 and over  Blood pressure check.** / Every 1 to 2 years.  Lipid and cholesterol check.**/ Every 5 years beginning at age 6.  Lung cancer screening. / Every year if you are aged 15-80 years and have a 30-pack-year history  of smoking and  currently smoke or have quit within the past 15 years. Yearly screening is stopped once you have quit smoking for at least 15 years or develop a health problem that would prevent you from having lung cancer treatment.  Fecal occult blood test (FOBT) of stool. / Every year beginning at age 60 and continuing until age 34. You may not have to do this test if you get a colonoscopy every 10 years.  Flexible sigmoidoscopy** or colonoscopy.** / Every 5 years for a flexible sigmoidoscopy or every 10 years for a colonoscopy beginning at age 82 and continuing until age 8.  Hepatitis C blood test.** / For all people born from [birth year] through 1963 and any individual with known risks for hepatitis C.  Abdominal aortic aneurysm (AAA) screening.** / A one-time screening for ages 40 to 37 years who are current or former smokers.  Skin self-exam. / Monthly.  Influenza vaccine. / Every year.  Tetanus, diphtheria, and acellular pertussis (Tdap/Td) vaccine.** / 1 dose of Td every 10 years.  Varicella vaccine.** / Consult your health care provider.  Zoster vaccine.** / 1 dose for adults aged 15 years or older.  Pneumococcal 13-valent conjugate (PCV13) vaccine.** / Consult your health care provider.  Pneumococcal polysaccharide (PPSV23) vaccine.** / 1 dose for all adults aged 32 years and older.  Meningococcal vaccine.** / Consult your health care provider.  Hepatitis A vaccine.** / Consult your health care provider.  Hepatitis B vaccine.** / Consult your health care provider.  Haemophilus influenzae type b (Hib) vaccine.** / Consult your health care provider. **Family history and personal history of risk and conditions may change your health care provider's recommendations. Document Released: 11/24/2001 Document Revised: 10/03/2013 Document Reviewed: 02/23/2011 [facility] Patient Information 2015 [place], [place]. This information is not intended to replace advice given to you by your health care provider.  Make sure you discuss any questions you have with your health care provider.

## 2014-06-25 ENCOUNTER — Other Ambulatory Visit: Payer: Self-pay | Admitting: Family Medicine

## 2014-07-05 ENCOUNTER — Ambulatory Visit (INDEPENDENT_AMBULATORY_CARE_PROVIDER_SITE_OTHER): Payer: Medicare Other | Admitting: Family Medicine

## 2014-07-05 ENCOUNTER — Encounter (INDEPENDENT_AMBULATORY_CARE_PROVIDER_SITE_OTHER): Payer: Self-pay

## 2014-07-05 ENCOUNTER — Encounter: Payer: Self-pay | Admitting: Family Medicine

## 2014-07-05 VITALS — BP 150/86 | HR 79 | Temp 97.5°F | Ht 67.0 in | Wt 193.0 lb

## 2014-07-05 DIAGNOSIS — Z7901 Long term (current) use of anticoagulants: Secondary | ICD-10-CM

## 2014-07-05 DIAGNOSIS — R972 Elevated prostate specific antigen [PSA]: Secondary | ICD-10-CM

## 2014-07-05 DIAGNOSIS — Z23 Encounter for immunization: Secondary | ICD-10-CM

## 2014-07-05 DIAGNOSIS — I4819 Other persistent atrial fibrillation: Secondary | ICD-10-CM

## 2014-07-05 DIAGNOSIS — I4891 Unspecified atrial fibrillation: Secondary | ICD-10-CM

## 2014-07-05 DIAGNOSIS — I2510 Atherosclerotic heart disease of native coronary artery without angina pectoris: Secondary | ICD-10-CM

## 2014-07-05 DIAGNOSIS — I10 Essential (primary) hypertension: Secondary | ICD-10-CM

## 2014-07-05 DIAGNOSIS — K219 Gastro-esophageal reflux disease without esophagitis: Secondary | ICD-10-CM

## 2014-07-05 DIAGNOSIS — E559 Vitamin D deficiency, unspecified: Secondary | ICD-10-CM

## 2014-07-05 LAB — POCT CBC
GRANULOCYTE PERCENT: 52.2 % (ref 37–80)
HCT, POC: 41.6 % — AB (ref 43.5–53.7)
HEMOGLOBIN: 13.8 g/dL — AB (ref 14.1–18.1)
LYMPH, POC: 5.2 — AB (ref 0.6–3.4)
MCH: 32.1 pg — AB (ref 27–31.2)
MCHC: 33.2 g/dL (ref 31.8–35.4)
MCV: 96.7 fL (ref 80–97)
MPV: 9.2 fL (ref 0–99.8)
POC Granulocyte: 5.8 (ref 2–6.9)
POC LYMPH %: 46.1 % (ref 10–50)
Platelet Count, POC: 217 10*3/uL (ref 142–424)
RBC: 4.3 M/uL — AB (ref 4.69–6.13)
RDW, POC: 13.8 %
WBC: 11.2 10*3/uL — AB (ref 4.6–10.2)

## 2014-07-05 LAB — POCT INR: INR: 2.6

## 2014-07-05 MED ORDER — DILTIAZEM HCL ER COATED BEADS 180 MG PO CP24: 180.0000 mg | ORAL_CAPSULE | Freq: Every day | ORAL | Status: DC

## 2014-07-05 NOTE — Progress Notes (Signed)
Subjective:    Patient ID: [name], male    DOB: 30-Oct-1931, 80 y.o.   MRN: [id number]  HPI Pt here for follow up and management of chronic medical problems. The patient comes to the visit today with his wife. He is doing well as has no complaints as usual. He needs to return and FOBT and he will get his routine lab work done today. The patient indicates that his blood pressures run in the 140s over the 80s at home. He sees a cardiologist once yearly. He also sees the orthopedist about his left hip pain.       Patient Active Problem List   Diagnosis Date Noted  . Cataract of both eyes 05/25/2014  . Long term (current) use of anticoagulants 02/14/2013  . OVERWEIGHT 12/11/2009  . ANEMIA 02/25/2009  . HYPERTENSION 02/25/2009  . [name] 02/25/2009  . FIBRILLATION, ATRIAL 02/25/2009  . GERD 02/25/2009  . DEGENERATIVE JOINT DISEASE 02/25/2009   Outpatient Encounter Prescriptions as of 07/05/2014  Medication Sig  . acetaminophen (TYLENOL) 650 MG CR tablet Take 650 mg by mouth as needed.    . carvedilol (COREG) 12.5 MG tablet TAKE ONE TABLET BY MOUTH TWICE DAILY  . Cholecalciferol (VITAMIN D-3 PO) Take 1 tablet by mouth daily.  . diclofenac sodium (VOLTAREN) 1 % GEL Apply 1 gram to hands or other affected joints up to 4 times daily  . diltiazem (CARDIZEM CD) 120 MG 24 hr capsule TAKE ONE (1) CAPSULE EACH DAY  . fluticasone (FLONASE) 50 MCG/ACT nasal spray Use 1 to 2 sprays in each nostril daily  . furosemide (LASIX) 20 MG tablet Take 1 tablet (20 mg total) by mouth daily.  . rosuvastatin (CRESTOR) 10 MG tablet Take 10 mg by mouth daily. One tablet on Monday and Friday as tolerated  . warfarin (COUMADIN) 5 MG tablet Take 1 tablet (5 mg total) by mouth daily.    Review of Systems  Constitutional: Negative.   HENT: Negative.   Eyes: Negative.   Respiratory: Negative.   Cardiovascular: Negative.   Gastrointestinal: Negative.   Endocrine: Negative.   Genitourinary: Negative.     Musculoskeletal: Negative.   Skin: Negative.   Allergic/Immunologic: Negative.   Neurological: Negative.   Hematological: Negative.   Psychiatric/Behavioral: Negative.        Objective:   Physical Exam  Nursing note and vitals reviewed. Constitutional: He is oriented to person, place, and time. He appears well-developed and well-nourished. No distress.  HENT:  Head: Normocephalic and atraumatic.  Right Ear: External ear normal.  Left Ear: External ear normal.  Mouth/Throat: Oropharynx is clear and moist. No oropharyngeal exudate.  There is nasal pallor and congestion  Eyes: Conjunctivae and EOM are normal. Pupils are equal, round, and reactive to light. Right eye exhibits no discharge. Left eye exhibits no discharge. No scleral icterus.  Neck: Normal range of motion. Neck supple. No thyromegaly present.  There are no carotid bruits  Cardiovascular: Normal rate, normal heart sounds and intact distal pulses.  Exam reveals no gallop and no friction rub.   No murmur heard. The heart has an irregular irregular rhythm at 60 per minute  Pulmonary/Chest: Effort normal and breath sounds normal. No respiratory distress. He has no wheezes. He has no rales. He exhibits no tenderness.  Abdominal: Soft. Bowel sounds are normal. He exhibits no mass. There is no tenderness. There is no rebound and no guarding.  There are no masses or organ enlargement and there are no inguinal nodes.  Musculoskeletal: Normal range of motion. He exhibits no edema and no tenderness.  There is tenderness anteriorly in the left hip. This is what he's been seeing the orthopedic surgeon about.  Lymphadenopathy:    He has no cervical adenopathy.  Neurological: He is alert and oriented to person, place, and time. He has normal reflexes. No cranial nerve deficit.  Skin: Skin is warm and dry. No rash noted. No erythema. No pallor.  Psychiatric: He has a normal mood and affect. His behavior is normal. Judgment and thought  content normal.   BP 147/87  Pulse 79  Temp(Src) 97.5 F (36.4 C) (Oral)  Ht _0  (1.702 m)  Wt 193 lb (87.544 kg)  BMI 30.22 kg/m2  Repeat blood pressure right arm sitting 150/86      Assessment & Plan:  1. [name] - POCT CBC - BMP8+EGFR - NMR, lipoprofile  2. Persistent atrial fibrillation - POCT CBC  3. Gastroesophageal reflux disease, esophagitis presence not specified - POCT CBC  4. HYPERTENSION - POCT CBC - BMP8+EGFR - Hepatic function panel -Increase cardiazem CD to 180  Patient Instructions                       Medicare Annual Wellness Visit  [facility] and the medical providers at [facility] strive to bring you the best medical care.  In doing so we not only want to address your current medical conditions and concerns but also to detect new conditions early and prevent illness, disease and health-related problems.    Medicare offers a yearly Wellness Visit which allows our clinical staff to assess your need for preventative services including immunizations, lifestyle education, counseling to decrease risk of preventable diseases and screening for fall risk and other medical concerns.    This visit is provided free of charge (no copay) for all Medicare recipients. The clinical pharmacists at [facility] have begun to conduct these Wellness Visits which will also include a thorough review of all your medications.    As you primary medical provider recommend that you make an appointment for your Annual Wellness Visit if you have not done so already this year.  You may set up this appointment before you leave today or you may call back ([phone]) and schedule an appointment.  Please make sure when you call that you mention that you are scheduling your Annual Wellness Visit with the clinical pharmacist so that the appointment may be made for the proper length of time.     Continue current medications. Continue good  therapeutic lifestyle changes which include good diet and exercise. Fall precautions discussed with patient. If an FOBT was given today- please return it to our front desk. If you are over 45 years old - you may need Prevnar 55 or the adult Pneumonia vaccine.  Flu Shots will be available at our office starting mid- September. Please call and schedule a FLU CLINIC APPOINTMENT.   Keep appointments with cardiology and orthopedics Continue to watch sodium intake When you finish the current prescription for cardia some 120 we would like to change this to cardia some 180. We would ask you to come by here a couple of weeks after starting this and getting a blood pressure check by the nurse.   Arrie Senate MD   5. Elevated PSA - PSA, total and free  6. Vitamin D deficiency - Vit D  25 hydroxy (rtn osteoporosis monitoring)

## 2014-07-05 NOTE — Addendum Note (Signed)
Addended by: Zannie Cove on: 07/05/2014 10:04 AM   Modules accepted: Orders

## 2014-07-05 NOTE — Addendum Note (Signed)
Addended by: Zannie Cove on: 07/05/2014 10:15 AM   Modules accepted: Orders

## 2014-07-05 NOTE — Addendum Note (Signed)
Addended by: Pollyann Kennedy F on: 07/05/2014 10:02 AM   Modules accepted: Orders

## 2014-07-05 NOTE — Patient Instructions (Addendum)
Anticoagulation Dose Instructions as of 07/05/2014     [name] Tue Wed Thu Fri Sat   New Dose 5 mg 2.5 mg 5 mg 5 mg 5 mg 2.5 mg 5 mg    Description       Continue 1/2 tablet on Mondays and Fridays and 1 tablet all other days                          INR was 2.6 today   Medicare Annual Wellness Visit  [facility] and the medical providers at [facility] strive to bring you the best medical care.  In doing so we not only want to address your current medical conditions and concerns but also to detect new conditions early and prevent illness, disease and health-related problems.    Medicare offers a yearly Wellness Visit which allows our clinical staff to assess your need for preventative services including immunizations, lifestyle education, counseling to decrease risk of preventable diseases and screening for fall risk and other medical concerns.    This visit is provided free of charge (no copay) for all Medicare recipients. The clinical pharmacists at [facility] have begun to conduct these Wellness Visits which will also include a thorough review of all your medications.    As you primary medical provider recommend that you make an appointment for your Annual Wellness Visit if you have not done so already this year.  You may set up this appointment before you leave today or you may call back ([phone]) and schedule an appointment.  Please make sure when you call that you mention that you are scheduling your Annual Wellness Visit with the clinical pharmacist so that the appointment may be made for the proper length of time.     Continue current medications. Continue good therapeutic lifestyle changes which include good diet and exercise. Fall precautions discussed with patient. If an FOBT was given today- please return it to our front desk. If you are over 28 years old - you may need Prevnar 3 or the adult Pneumonia vaccine.  Flu Shots will  be available at our office starting mid- September. Please call and schedule a FLU CLINIC APPOINTMENT.   Keep appointments with cardiology and orthopedics Continue to watch sodium intake When you finish the current prescription for cardia some 120 we would like to change this to cardia some 180. We would ask you to come by here a couple of weeks after starting this and getting a blood pressure check by the nurse.

## 2014-07-06 LAB — HEPATIC FUNCTION PANEL
ALT: 13 IU/L (ref 0–44)
AST: 16 IU/L (ref 0–40)
Albumin: 4.4 g/dL (ref 3.5–4.8)
Alkaline Phosphatase: 55 IU/L (ref 39–117)
BILIRUBIN TOTAL: 0.5 mg/dL (ref 0.0–1.2)
Bilirubin, Direct: 0.15 mg/dL (ref 0.00–0.40)
Total Protein: 6.8 g/dL (ref 6.0–8.5)

## 2014-07-06 LAB — NMR, LIPOPROFILE
CHOLESTEROL: 179 mg/dL (ref 100–199)
HDL CHOLESTEROL BY NMR: 48 mg/dL (ref >39)
HDL Particle Number: 23.5 umol/L — ABNORMAL LOW (ref >=30.5)
LDL Particle Number: 1374 nmol/L — ABNORMAL HIGH (ref <1000)
LDL SIZE: 21.3 nm (ref >20.5)
LDLC SERPL CALC-MCNC: 104 mg/dL — ABNORMAL HIGH (ref 0–99)
LP-IR Score: <25 (ref <=45)
SMALL LDL PARTICLE NUMBER: 572 nmol/L — AB (ref <=527)
TRIGLYCERIDES BY NMR: 133 mg/dL (ref 0–149)

## 2014-07-06 LAB — PSA, TOTAL AND FREE
PSA FREE: 3.04 ng/mL
PSA, Free Pct: 23.8 %
PSA: 12.8 ng/mL — ABNORMAL HIGH (ref 0.0–4.0)

## 2014-07-06 LAB — BMP8+EGFR
BUN/Creatinine Ratio: 10 (ref 10–22)
BUN: 11 mg/dL (ref 8–27)
CALCIUM: 9.2 mg/dL (ref 8.6–10.2)
CO2: 26 mmol/L (ref 18–29)
CREATININE: 1.13 mg/dL (ref 0.76–1.27)
Chloride: 102 mmol/L (ref 97–108)
GFR calc Af Amer: 71 mL/min/{1.73_m2} (ref >59)
GFR calc non Af Amer: 61 mL/min/{1.73_m2} (ref >59)
Glucose: 109 mg/dL — ABNORMAL HIGH (ref 65–99)
Potassium: 4.2 mmol/L (ref 3.5–5.2)
SODIUM: 141 mmol/L (ref 134–144)

## 2014-07-06 LAB — VITAMIN D 25 HYDROXY (VIT D DEFICIENCY, FRACTURES): Vit D, 25-Hydroxy: 39.8 ng/mL (ref 30.0–100.0)

## 2014-07-16 ENCOUNTER — Other Ambulatory Visit: Payer: Medicare Other

## 2014-07-16 NOTE — Progress Notes (Signed)
Lab only 

## 2014-07-16 NOTE — Addendum Note (Signed)
Addended by: Earlene Plater on: 07/16/2014 10:58 AM   Modules accepted: Orders

## 2014-07-17 LAB — FECAL OCCULT BLOOD, IMMUNOCHEMICAL: FECAL OCCULT BLD: NEGATIVE

## 2014-07-24 ENCOUNTER — Other Ambulatory Visit (HOSPITAL_COMMUNITY): Payer: Self-pay | Admitting: Internal Medicine

## 2014-07-24 DIAGNOSIS — R3129 Other microscopic hematuria: Secondary | ICD-10-CM

## 2014-08-13 ENCOUNTER — Ambulatory Visit (INDEPENDENT_AMBULATORY_CARE_PROVIDER_SITE_OTHER): Payer: Medicare Other | Admitting: Pharmacist

## 2014-08-13 DIAGNOSIS — I481 Persistent atrial fibrillation: Secondary | ICD-10-CM

## 2014-08-13 DIAGNOSIS — I4891 Unspecified atrial fibrillation: Secondary | ICD-10-CM

## 2014-08-13 DIAGNOSIS — I4819 Other persistent atrial fibrillation: Secondary | ICD-10-CM

## 2014-08-13 LAB — POCT INR: INR: 2.4

## 2014-08-13 NOTE — Patient Instructions (Signed)
Anticoagulation Dose Instructions as of 08/13/2014      Dorene Grebe Tue Wed Thu Fri Sat   New Dose 5 mg 2.5 mg 5 mg 5 mg 5 mg 2.5 mg 5 mg    Description        Continue 1/2 tablet on Mondays and Fridays and 1 tablet all other days      INR was 2.4 today

## 2014-08-23 ENCOUNTER — Other Ambulatory Visit: Payer: Self-pay | Admitting: Family Medicine

## 2014-09-10 ENCOUNTER — Other Ambulatory Visit: Payer: Self-pay | Admitting: Urology

## 2014-09-10 DIAGNOSIS — R3129 Other microscopic hematuria: Secondary | ICD-10-CM

## 2014-09-11 ENCOUNTER — Ambulatory Visit (HOSPITAL_COMMUNITY)
Admission: RE | Admit: 2014-09-11 | Discharge: 2014-09-11 | Disposition: A | Discharge status: Home / Self Care | Payer: Medicare Other | Source: Ambulatory Visit | Attending: Internal Medicine | Admitting: Internal Medicine

## 2014-09-11 DIAGNOSIS — I700 Atherosclerosis of aorta: Secondary | ICD-10-CM | POA: Insufficient documentation

## 2014-09-11 DIAGNOSIS — K7689 Other specified diseases of liver: Secondary | ICD-10-CM | POA: Diagnosis not present

## 2014-09-11 DIAGNOSIS — K8020 Calculus of gallbladder without cholecystitis without obstruction: Secondary | ICD-10-CM | POA: Insufficient documentation

## 2014-09-11 DIAGNOSIS — R312 Other microscopic hematuria: Secondary | ICD-10-CM | POA: Diagnosis present

## 2014-09-11 DIAGNOSIS — N400 Enlarged prostate without lower urinary tract symptoms: Secondary | ICD-10-CM | POA: Insufficient documentation

## 2014-09-11 DIAGNOSIS — R3129 Other microscopic hematuria: Secondary | ICD-10-CM

## 2014-09-11 LAB — POCT I-STAT CREATININE: Creatinine, Ser: 1.1 mg/dL (ref 0.50–1.35)

## 2014-09-11 MED ORDER — IOHEXOL 300 MG/ML  SOLN
125.0000 mL | Freq: Once | INTRAMUSCULAR | Status: AC | PRN
  Administered 2014-09-11: 125 mL via INTRAVENOUS

## 2014-09-21 ENCOUNTER — Other Ambulatory Visit: Payer: Self-pay | Admitting: Family Medicine

## 2014-09-21 ENCOUNTER — Ambulatory Visit (INDEPENDENT_AMBULATORY_CARE_PROVIDER_SITE_OTHER): Payer: Medicare Other | Admitting: Urology

## 2014-09-21 DIAGNOSIS — R312 Other microscopic hematuria: Secondary | ICD-10-CM

## 2014-09-25 ENCOUNTER — Ambulatory Visit (INDEPENDENT_AMBULATORY_CARE_PROVIDER_SITE_OTHER): Payer: Medicare Other | Admitting: Pharmacist Clinician (PhC)/ Clinical Pharmacy Specialist

## 2014-09-25 DIAGNOSIS — I4891 Unspecified atrial fibrillation: Secondary | ICD-10-CM

## 2014-09-25 LAB — POCT INR: INR: 4.7

## 2014-09-25 NOTE — Patient Instructions (Signed)
Anticoagulation Dose Instructions as of 09/25/2014      [name] Tue Wed Thu Fri Sat   New Dose 5 mg 2.5 mg 5 mg 5 mg 5 mg 2.5 mg 5 mg    Description        Has been taking OTC cough medicine and alka selzer.  No coumadin today and tomorrow. Then resume on Thursday regular schedule.

## 2014-10-13 ENCOUNTER — Emergency Department (HOSPITAL_COMMUNITY)
Admission: EM | Admit: 2014-10-13 | Discharge: 2014-10-13 | Disposition: A | Discharge status: Home / Self Care | Payer: Medicare Other | Attending: Emergency Medicine | Admitting: Emergency Medicine

## 2014-10-13 ENCOUNTER — Encounter (HOSPITAL_COMMUNITY): Payer: Self-pay | Admitting: Emergency Medicine

## 2014-10-13 ENCOUNTER — Emergency Department (HOSPITAL_COMMUNITY): Payer: Medicare Other

## 2014-10-13 DIAGNOSIS — Z85828 Personal history of other malignant neoplasm of skin: Secondary | ICD-10-CM | POA: Diagnosis not present

## 2014-10-13 DIAGNOSIS — Z7901 Long term (current) use of anticoagulants: Secondary | ICD-10-CM | POA: Diagnosis not present

## 2014-10-13 DIAGNOSIS — Z951 Presence of aortocoronary bypass graft: Secondary | ICD-10-CM | POA: Diagnosis not present

## 2014-10-13 DIAGNOSIS — Z79899 Other long term (current) drug therapy: Secondary | ICD-10-CM | POA: Diagnosis not present

## 2014-10-13 DIAGNOSIS — K219 Gastro-esophageal reflux disease without esophagitis: Secondary | ICD-10-CM | POA: Diagnosis not present

## 2014-10-13 DIAGNOSIS — I2510 Atherosclerotic heart disease of native coronary artery without angina pectoris: Secondary | ICD-10-CM | POA: Insufficient documentation

## 2014-10-13 DIAGNOSIS — I10 Essential (primary) hypertension: Secondary | ICD-10-CM | POA: Insufficient documentation

## 2014-10-13 DIAGNOSIS — R059 Cough, unspecified: Secondary | ICD-10-CM

## 2014-10-13 DIAGNOSIS — Z880 Allergy status to penicillin: Secondary | ICD-10-CM | POA: Insufficient documentation

## 2014-10-13 DIAGNOSIS — Z87891 Personal history of nicotine dependence: Secondary | ICD-10-CM | POA: Insufficient documentation

## 2014-10-13 DIAGNOSIS — Z872 Personal history of diseases of the skin and subcutaneous tissue: Secondary | ICD-10-CM | POA: Insufficient documentation

## 2014-10-13 DIAGNOSIS — Z862 Personal history of diseases of the blood and blood-forming organs and certain disorders involving the immune mechanism: Secondary | ICD-10-CM | POA: Diagnosis not present

## 2014-10-13 DIAGNOSIS — R05 Cough: Secondary | ICD-10-CM | POA: Diagnosis not present

## 2014-10-13 DIAGNOSIS — Z7952 Long term (current) use of systemic steroids: Secondary | ICD-10-CM | POA: Diagnosis not present

## 2014-10-13 DIAGNOSIS — J209 Acute bronchitis, unspecified: Secondary | ICD-10-CM | POA: Diagnosis not present

## 2014-10-13 LAB — PROTIME-INR
INR: 2.86 — ABNORMAL HIGH (ref 0.00–1.49)
PROTHROMBIN TIME: 30.2 s — AB (ref 11.6–15.2)

## 2014-10-13 MED ORDER — ALBUTEROL SULFATE HFA 108 (90 BASE) MCG/ACT IN AERS
2.0000 | INHALATION_SPRAY | RESPIRATORY_TRACT | Status: DC | PRN
  Administered 2014-10-13: 2 via RESPIRATORY_TRACT
  Filled 2014-10-13: qty 6.7

## 2014-10-13 MED ORDER — AEROCHAMBER PLUS W/MASK MISC
1.0000 | Freq: Once | Status: AC
  Administered 2014-10-13: 1

## 2014-10-13 NOTE — Discharge Instructions (Signed)
Acute Bronchitis  Your INR today(Coumadin level) is 2.86. Call your doctor to see if it needs to be repeated as scheduled next week. Use your inhaler 2 puffs every 4 hours as needed for cough or shortness of breath. See your doctor if not better in 7-10 days Bronchitis is inflammation of the airways that extend from the windpipe into the lungs (bronchi). The inflammation often causes mucus to develop. This leads to a cough, which is the most common symptom of bronchitis.  In acute bronchitis, the condition usually develops suddenly and goes away over time, usually in a couple weeks. Smoking, allergies, and asthma can make bronchitis worse. Repeated episodes of bronchitis may cause further lung problems.  CAUSES Acute bronchitis is most often caused by the same virus that causes a cold. The virus can spread from person to person (contagious) through coughing, sneezing, and touching contaminated objects. SIGNS AND SYMPTOMS   Cough.   Fever.   Coughing up mucus.   Body aches.   Chest congestion.   Chills.   Shortness of breath.   Sore throat.  DIAGNOSIS  Acute bronchitis is usually diagnosed through a physical exam. Your health care provider will also ask you questions about your medical history. Tests, such as chest X-rays, are sometimes done to rule out other conditions.  TREATMENT  Acute bronchitis usually goes away in a couple weeks. Oftentimes, no medical treatment is necessary. Medicines are sometimes given for relief of fever or cough. Antibiotic medicines are usually not needed but may be prescribed in certain situations. In some cases, an inhaler may be recommended to help reduce shortness of breath and control the cough. A cool mist vaporizer may also be used to help thin bronchial secretions and make it easier to clear the chest.  HOME CARE INSTRUCTIONS  Get plenty of rest.   Drink enough fluids to keep your urine clear or pale yellow (unless you have a medical  condition that requires fluid restriction). Increasing fluids may help thin your respiratory secretions (sputum) and reduce chest congestion, and it will prevent dehydration.   Take medicines only as directed by your health care provider.  If you were prescribed an antibiotic medicine, finish it all even if you start to feel better.  Avoid smoking and secondhand smoke. Exposure to cigarette smoke or irritating chemicals will make bronchitis worse. If you are a smoker, consider using nicotine gum or skin patches to help control withdrawal symptoms. Quitting smoking will help your lungs heal faster.   Reduce the chances of another bout of acute bronchitis by washing your hands frequently, avoiding people with cold symptoms, and trying not to touch your hands to your mouth, nose, or eyes.   Keep all follow-up visits as directed by your health care provider.  SEEK MEDICAL CARE IF: Your symptoms do not improve after 1 week of treatment.  SEEK IMMEDIATE MEDICAL CARE IF:  You develop an increased fever or chills.   You have chest pain.   You have severe shortness of breath.  You have bloody sputum.   You develop dehydration.  You faint or repeatedly feel like you are going to pass out.  You develop repeated vomiting.  You develop a severe headache. MAKE SURE YOU:   Understand these instructions.  Will watch your condition.  Will get help right away if you are not doing well or get worse. Document Released: 11/05/2004 Document Revised: 02/12/2014 Document Reviewed: 03/21/2013 [facility] Patient Information 2015 [place], [place]. This information is not intended to  replace advice given to you by your health care provider. Make sure you discuss any questions you have with your health care provider. ° °

## 2014-10-13 NOTE — ED Notes (Signed)
RT called

## 2014-10-13 NOTE — ED Notes (Signed)
Patient c/o cough and chest congestion. Per patient productive cough with thick white sputum. Denies any fevers, nausea, vomiting, or diarrhea.

## 2014-10-13 NOTE — ED Provider Notes (Signed)
CSN: [id number]     Arrival date & time 10/13/14  0757 History  This chart was scribed for Orlie Dakin, MD by Stephania Fragmin, ED Scribe. This patient was seen in room APA19/APA19 and the patient's care was started at 8:23 AM.   No chief complaint on file.  The history is provided by the patient. No language interpreter was used.    HPI Comments: [name] is a 81 y.o. male with a history of  CABG and AFib who presents to the Emergency Department complaining of productive cough with thick, white sputum and cough congestion that began 30 days ago, went away, and returned again 3 days ago. Today is the first time he is being seen for these symptoms. Patient has tried Tylenol and cough syrup that relieved his symptoms. No other modifying factors were noted. His cough is productive of white sputum. Denies fever denies shortness of breath denies pain anywhere Patient takes Coumadin for AFib, and his last INR level was 4.4, taken 1 month ago. Patient has a history of knee arthroplasty and hip arthroplasty. He denies smoking and EtOH consumption. He also denies fever and SOB. Patient has a known allergy to Penicillin. Patient's wife has had similar illness for the past 3 days  Past Medical History  Diagnosis Date  . Hypertension   . Persistent atrial fibrillation   . Coronary artery disease     5 bypasses  . Ejection fraction < 50%     Mildly reduced, 40% by echo  . Anemia   . Cellulitis     In past  . DJD (degenerative joint disease)   . GERD (gastroesophageal reflux disease)   . PUD (peptic ulcer disease)   . Skin cancer of eyelid     Resected  . Atrial fibrillation    Past Surgical History  Procedure Laterality Date  . Coronary artery bypass graft  4/05    LIMA to LAD, SVG to PDA, SVG to ramus intermediate  . Heart bypass  2005  . Total knee arthroplasty      Right  . Appendectomy    . Eyelid carcinoma excision      Skin cancer resected  . Total hip arthroplasty      Right  . Bypass  graft  2005   Family History  Problem Relation Age of Onset  . Stroke Mother   . Stroke Father   . Hypertension Father   . Early death Brother     56 months old  . Cancer Brother     lung  . Stroke Brother     heat   History  Substance Use Topics  . Smoking status: Former Smoker    Quit date: 12/23/1963  . Smokeless tobacco: Never Used  . Alcohol Use: No    Review of Systems  Constitutional: Negative.   HENT: Negative.   Respiratory: Positive for cough.   Cardiovascular: Negative.   Gastrointestinal: Negative.   Musculoskeletal: Negative.   Skin: Negative.   Neurological: Negative.   Psychiatric/Behavioral: Negative.   All other systems reviewed and are negative.     Allergies  Penicillins; Hct; Liptruzet; and Statins  Home Medications   Prior to Admission medications   Medication Sig Start Date End Date Taking? Authorizing Provider  acetaminophen (TYLENOL) 650 MG CR tablet Take 650 mg by mouth as needed.      Historical Provider, MD  carvedilol (COREG) 12.5 MG tablet TAKE ONE TABLET BY MOUTH TWICE DAILY 09/21/14   Chipper Herb,  MD  Cholecalciferol (VITAMIN D-3 PO) Take 1 tablet by mouth daily.    Historical Provider, MD  diclofenac sodium (VOLTAREN) 1 % GEL Apply 1 gram to hands or other affected joints up to 4 times daily 06/08/13   Chipper Herb, MD  diltiazem (CARDIZEM CD) 120 MG 24 hr capsule TAKE ONE (1) CAPSULE EACH DAY 08/23/14   Chipper Herb, MD  diltiazem (CARDIZEM CD) 180 MG 24 hr capsule Take 1 capsule (180 mg total) by mouth daily. 07/05/14   Chipper Herb, MD  fluticasone Asencion Islam) 50 MCG/ACT nasal spray Use 1 to 2 sprays in each nostril daily 05/25/14   Chipper Herb, MD  furosemide (LASIX) 20 MG tablet Take 1 tablet (20 mg total) by mouth daily. 10/30/13   Chipper Herb, MD  rosuvastatin (CRESTOR) 10 MG tablet Take 10 mg by mouth daily. One tablet on Monday and Friday as tolerated    Historical Provider, MD  warfarin (COUMADIN) 5 MG tablet Take  1 tablet (5 mg total) by mouth daily. 01/25/14   Tammy Eckard, PHARMD   There were no vitals taken for this visit. Physical Exam  Constitutional: He appears well-developed and well-nourished.  HENT:  Head: Normocephalic and atraumatic.  Eyes: Conjunctivae are normal. Pupils are equal, round, and reactive to light.  Neck: Neck supple. No tracheal deviation present. No thyromegaly present.  Cardiovascular: Normal rate.   No murmur heard. Irregularly irregular  Pulmonary/Chest: Effort normal and breath sounds normal.  Abdominal: Soft. Bowel sounds are normal. He exhibits no distension. There is no tenderness.  Musculoskeletal: Normal range of motion. He exhibits no edema or tenderness.  Neurological: He is alert. Coordination normal.  Skin: Skin is warm and dry. No rash noted.  Psychiatric: He has a normal mood and affect.  Nursing note and vitals reviewed.   ED Course  Procedures (including critical care time)  DIAGNOSTIC STUDIES: Oxygen Saturation is 98% on room air, normal by my interpretation.    COORDINATION OF CARE: 8:00 AM - Discussed treatment plan with pt at bedside which includes blood work and chest x-ray and pt agreed to plan.   Labs Review Labs Reviewed - No data to display  Imaging Review No results found.   EKG Interpretation None     10:15 AM patient resting comfortably. Chest x-ray viewed by me Results for orders placed or performed during the hospital encounter of 10/13/14  Protime-INR  Result Value Ref Range   Prothrombin Time 30.2 (H) 11.6 - 15.2 seconds   INR 2.86 (H) 0.00 - 1.49   Dg Chest 2 View  10/13/2014   CLINICAL DATA:  Initial evaluation for cough  EXAM: CHEST  2 VIEW  COMPARISON:  11/2013  FINDINGS: Mild to moderate cardiac enlargement. Patient is status post CABG. Vascular pattern is normal.  No pneumothorax or pleural effusion.  Lungs are clear.  IMPRESSION: No active cardiopulmonary disease.   Electronically Signed   By: Skipper Cliche M.D.    On: 10/13/2014 09:42    MDM  INR is therapeutic Final diagnoses:  None   plan albuterol HFA with days ago he is 2 puffs every 4 hours when necessary cough or shortness of breath. Follow-up with PMD if not better in 7-10 days Diagnosis acute bronchitis       Orlie Dakin, MD 10/13/14 1021

## 2014-10-13 NOTE — ED Notes (Signed)
MD at bedside. 

## 2014-10-13 NOTE — ED Notes (Signed)
RT at bedside.

## 2014-10-23 ENCOUNTER — Other Ambulatory Visit: Payer: Self-pay | Admitting: Family Medicine

## 2014-11-19 ENCOUNTER — Encounter: Payer: Self-pay | Admitting: Family Medicine

## 2014-11-19 ENCOUNTER — Ambulatory Visit (INDEPENDENT_AMBULATORY_CARE_PROVIDER_SITE_OTHER): Payer: Medicare Other | Admitting: Family Medicine

## 2014-11-19 VITALS — BP 138/93 | HR 84 | Temp 97.0°F | Ht 67.5 in | Wt 194.0 lb

## 2014-11-19 DIAGNOSIS — I10 Essential (primary) hypertension: Secondary | ICD-10-CM | POA: Diagnosis not present

## 2014-11-19 DIAGNOSIS — R059 Cough, unspecified: Secondary | ICD-10-CM

## 2014-11-19 DIAGNOSIS — I482 Chronic atrial fibrillation, unspecified: Secondary | ICD-10-CM

## 2014-11-19 DIAGNOSIS — R972 Elevated prostate specific antigen [PSA]: Secondary | ICD-10-CM

## 2014-11-19 DIAGNOSIS — E559 Vitamin D deficiency, unspecified: Secondary | ICD-10-CM

## 2014-11-19 DIAGNOSIS — N400 Enlarged prostate without lower urinary tract symptoms: Secondary | ICD-10-CM | POA: Diagnosis not present

## 2014-11-19 DIAGNOSIS — E785 Hyperlipidemia, unspecified: Secondary | ICD-10-CM

## 2014-11-19 DIAGNOSIS — R1032 Left lower quadrant pain: Secondary | ICD-10-CM | POA: Diagnosis not present

## 2014-11-19 DIAGNOSIS — R05 Cough: Secondary | ICD-10-CM

## 2014-11-19 DIAGNOSIS — K219 Gastro-esophageal reflux disease without esophagitis: Secondary | ICD-10-CM | POA: Diagnosis not present

## 2014-11-19 LAB — POCT CBC
Granulocyte percent: 56.2 %G (ref 37–80)
HCT, POC: 45.4 % (ref 43.5–53.7)
HEMOGLOBIN: 13.3 g/dL — AB (ref 14.1–18.1)
Lymph, poc: 4.4 — AB (ref 0.6–3.4)
MCH: 28.5 pg (ref 27–31.2)
MCHC: 29.2 g/dL — AB (ref 31.8–35.4)
MCV: 97.6 fL — AB (ref 80–97)
MPV: 8.8 fL (ref 0–99.8)
PLATELET COUNT, POC: 207 10*3/uL (ref 142–424)
POC GRANULOCYTE: 5.9 (ref 2–6.9)
POC LYMPH %: 41.6 % (ref 10–50)
RBC: 4.7 M/uL (ref 4.69–6.13)
RDW, POC: 13.8 %
WBC: 10.5 10*3/uL — AB (ref 4.6–10.2)

## 2014-11-19 LAB — POCT INR: INR: 3.3

## 2014-11-19 NOTE — Patient Instructions (Addendum)
Anticoagulation Dose Instructions as of 11/19/2014      [name] Tue Wed Thu Fri Sat   New Dose 5 mg 2.5 mg 5 mg 2.5 mg 5 mg 2.5 mg 5 mg    Description        No warfarin today then decreases warfarin dose to 5mg  - take 1/2 tablet mondays, wednesdays and fridays.  Take 1 tablet all other days.       INR was 3.3 today                        Medicare Annual Wellness Visit  [facility] and the medical providers at [facility] strive to bring you the best medical care.  In doing so we not only want to address your current medical conditions and concerns but also to detect new conditions early and prevent illness, disease and health-related problems.    Medicare offers a yearly Wellness Visit which allows our clinical staff to assess your need for preventative services including immunizations, lifestyle education, counseling to decrease risk of preventable diseases and screening for fall risk and other medical concerns.    This visit is provided free of charge (no copay) for all Medicare recipients. The clinical pharmacists at [facility] have begun to conduct these Wellness Visits which will also include a thorough review of all your medications.    As you primary medical provider recommend that you make an appointment for your Annual Wellness Visit if you have not done so already this year.  You may set up this appointment before you leave today or you may call back ([phone]) and schedule an appointment.  Please make sure when you call that you mention that you are scheduling your Annual Wellness Visit with the clinical pharmacist so that the appointment may be made for the proper length of time.     Continue current medications. Continue good therapeutic lifestyle changes which include good diet and exercise. Fall precautions discussed with patient. If an FOBT was given today- please return it to our front desk. If you are over 16 years old - you  may need Prevnar 41 or the adult Pneumonia vaccine.  Flu Shots are still available at our office. If you still haven't had one please call to set up a nurse visit to get one.   After your visit with [place] today you will receive a survey in the mail or online from [employer] regarding your care with [place]. Please take a moment to fill this out. Your feedback is very important to [place] as you can help [place] better understand your patient needs as well as improve your experience and satisfaction. WE CARE ABOUT YOU!!!   Continue to follow-up regularly with the urologist, the cardiologist, and the orthopedist as needed for the persistent hip problem. Continue to practice good pulmonary hygiene by drinking plenty of fluids, keep the house as cool as possible, using the coolmist humidifier, and taking Mucinex. We will do the PSA for you today and make sure that the urologist gets a copy of this report. Continue your current medications as you're doing for your heart irregularity, your vitamin D, your cholesterol, and your allergies.

## 2014-11-19 NOTE — Progress Notes (Signed)
 Subjective:    Patient ID: [name], male    DOB: 10/13/1931, 82 y.o.   MRN: [id number]  HPI Pt here for follow up and management of chronic medical problems which include hypertension, hyperlipidemia, and atrial fibrillation. He is taking all medications regularly. The patient is exercising as much as possible and taking his medicines correctly. He is being followed by this practice, the urologist for an elevated PSA and by the cardiologist for his atrial fibrillation and also by the orthopedist for the arthritis in his left hip. His pain is still concerned recently is just recovering from this persistent cough-like his wife has had but this is getting better and he is taking Mucinex for this.        Patient Active Problem List   Diagnosis Date Noted  . Cataract of both eyes 05/25/2014  . Long term (current) use of anticoagulants 02/14/2013  . OVERWEIGHT 12/11/2009  . ANEMIA 02/25/2009  . HYPERTENSION 02/25/2009  . [name] 02/25/2009  . FIBRILLATION, ATRIAL 02/25/2009  . GERD 02/25/2009  . DEGENERATIVE JOINT DISEASE 02/25/2009   Outpatient Encounter Prescriptions as of 11/19/2014  Medication Sig  . acetaminophen (TYLENOL) 650 MG CR tablet Take 650 mg by mouth every 8 (eight) hours as needed for pain.   . carvedilol (COREG) 12.5 MG tablet TAKE ONE TABLET BY MOUTH TWICE DAILY  . Cholecalciferol (VITAMIN D-3 PO) Take 1 tablet by mouth daily.  . diclofenac sodium (VOLTAREN) 1 % GEL Apply 1 gram to hands or other affected joints up to 4 times daily  . diltiazem (CARDIZEM CD) 120 MG 24 hr capsule TAKE ONE (1) CAPSULE EACH DAY  . furosemide (LASIX) 20 MG tablet Take 1 tablet (20 mg total) by mouth daily.  . rosuvastatin (CRESTOR) 10 MG tablet Take 10 mg by mouth daily. One tablet on Monday and Friday as tolerated  . warfarin (COUMADIN) 5 MG tablet Take 1 tablet (5 mg total) by mouth daily.  . fluticasone (FLONASE) 50 MCG/ACT nasal spray Use 1 to 2 sprays in each nostril daily (Patient not  taking: Reported on 11/19/2014)  . [DISCONTINUED] diltiazem (CARDIZEM CD) 180 MG 24 hr capsule Take 1 capsule (180 mg total) by mouth daily. (Patient not taking: Reported on 10/13/2014)    Review of Systems  Constitutional: Negative.   HENT: Negative.   Eyes: Negative.   Respiratory: Positive for cough.   Cardiovascular: Negative.   Gastrointestinal: Positive for abdominal pain.  Endocrine: Negative.   Genitourinary: Negative.   Musculoskeletal: Negative.   Skin: Negative.   Allergic/Immunologic: Negative.   Neurological: Negative.   Hematological: Negative.   Psychiatric/Behavioral: Negative.        Objective:   Physical Exam  Constitutional: He is oriented to person, place, and time. He appears well-developed and well-nourished. No distress.  The patient is pleasant, and relaxed and indicates that his cough is getting better and he does indicate that periodically he said episodes of left lower quadrant pain. He denies any blood in the stool and this problem gets better on its own.  HENT:  Head: Normocephalic and atraumatic.  Right Ear: External ear normal.  Left Ear: External ear normal.  Nose: Nose normal.  Mouth/Throat: Oropharynx is clear and moist. No oropharyngeal exudate.  Eyes: Conjunctivae and EOM are normal. Pupils are equal, round, and reactive to light. Right eye exhibits no discharge. Left eye exhibits no discharge. No scleral icterus.  Neck: Normal range of motion. Neck supple. No thyromegaly present.  No carotid bruits or   anterior cervical adenopathy  Cardiovascular: Normal rate, normal heart sounds and intact distal pulses.  Exam reveals no gallop and no friction rub.   No murmur heard. The heart is irregular irregular at 84/m  Pulmonary/Chest: Effort normal and breath sounds normal. No respiratory distress. He has no wheezes. He has no rales. He exhibits no tenderness.  The patient does have a dry cough  Abdominal: Soft. Bowel sounds are normal. He exhibits no  mass. There is no tenderness. There is no rebound and no guarding.  No abdominal tenderness and no inguinal adenopathy  Musculoskeletal: Normal range of motion. He exhibits no edema or tenderness.  Good hip movement and motion today.  Lymphadenopathy:    He has no cervical adenopathy.  Neurological: He is alert and oriented to person, place, and time. He has normal reflexes. No cranial nerve deficit.  Skin: Skin is warm and dry. No rash noted. No erythema. No pallor.  Psychiatric: He has a normal mood and affect. His behavior is normal. Judgment and thought content normal.  Nursing note and vitals reviewed.  BP 138/93 mmHg  Pulse 84  Temp(Src) 97 F (36.1 C) (Oral)  Ht 5' 7.5" (1.715 m)  Wt 194 lb (87.998 kg)  BMI 29.92 kg/m2  Results for orders placed or performed in visit on 11/19/14  POCT INR  Result Value Ref Range   INR 3.3   POCT CBC  Result Value Ref Range   WBC 10.5 (A) 4.6 - 10.2 K/uL   Lymph, poc 4.4 (A) 0.6 - 3.4   POC LYMPH PERCENT 41.6 10 - 50 %L   POC Granulocyte 5.9 2 - 6.9   Granulocyte percent 56.2 37 - 80 %G   RBC 4.7 4.69 - 6.13 M/uL   Hemoglobin 13.3 (A) 14.1 - 18.1 g/dL   HCT, POC 45.4 43.5 - 53.7 %   MCV 97.6 (A) 80 - 97 fL   MCH, POC 28.5 27 - 31.2 pg   MCHC 29.2 (A) 31.8 - 35.4 g/dL   RDW, POC 13.8 %   Platelet Count, POC 207.0 142 - 424 K/uL   MPV 8.8 0 - 99.8 fL   The lab work results were discussed with the patient before he left the office as a result he will be given an FOBT to return.     Assessment & Plan:  1. Atrial fibrillation -Coumadin will be adjusted today based on pro time result - POCT INR - POCT CBC  2. Gastroesophageal reflux disease, esophagitis presence not specified -He is not having any problems with this at this time. - POCT CBC  3. Elevated PSA -He is being followed by the urologist and is requesting that we send a copy of this report to his urologist - POCT CBC - PSA, total and free  4. Vitamin D  deficiency -Continue current vitamin D dose is pending results of lab work being done today - POCT CBC - Vit D  25 hydroxy (rtn osteoporosis monitoring)  5. [name] (benign prostatic hyperplasia) -Follow up with Dr. [name] as planned - POCT CBC - PSA, total and free  6. Hyperlipidemia -For now, continue current treatment and aggressive therapeutic lifestyle changes - POCT CBC - NMR, lipoprofile  7. Essential hypertension -Continue current treatment and current heart medication and monitor blood pressures regularly at home and - POCT CBC - BMP8+EGFR - Hepatic function panel  8. Abdominal pain, left lower quadrant -If this problem continues to recur, please come to the office and let us evaluate   you at that time  9. Cough -Continue Coumadin as humidification, continue good pulmonary hygiene, continue Mucinex plain.   No orders of the defined types were placed in this encounter.   Patient Instructions   Anticoagulation Dose Instructions as of 11/19/2014      Dorene Grebe Tue Wed Thu Fri Sat   New Dose 5 mg 2.5 mg 5 mg 2.5 mg 5 mg 2.5 mg 5 mg    Description        No warfarin today then decreases warfarin dose to 32m - take 1/2 tablet mondays, wednesdays and fridays.  Take 1 tablet all other days.       INR was 3.3 today                        Medicare Annual Wellness Visit  [facility] the [facility] the best [facility] care.  In doing so we not only want to address your current medical conditions and concerns but also to detect new conditions early and prevent illness, disease and health-related problems.    Medicare offers a yearly Wellness Visit which allows our clinical staff to assess your need for preventative services including immunizations, lifestyle education, counseling to decrease risk of preventable diseases and screening for fall risk and other medical concerns.    This visit is provided free of charge  (no copay) for all Medicare recipients. [facility]its which will also include a thorough review of all your medications.    As you primary medical provider recommend that you make an appointment for your Annual Wellness Visit if you have not done so already this year.  You may set up this appointment before you leave today or you may call back [phone] and schedule an appointment.  Please make sure when you call that you mention that you are scheduling your Annual Wellness Visit with the clinical pharmacist so that the appointment may be made for the proper length of time.     Continue current medications. Continue good therapeutic lifestyle changes which include good diet and exercise. Fall precautions discussed with patient. If an FOBT was given today- please return it to our front desk. If you are over 579years old - you may need Prevnar 150or the adult Pneumonia vaccine.  Flu Shots are still available at our office. If you still haven't had one please call to set up a nurse visit to get one.   After your visit with [facility] you will receive a survey in the mail or online from [employer]arding your care with [facility] Please take a moment to fill this out. Your feedback is very important to [facility] you can help [facility] understand your patient needs as well as improve your experience and satisfaction. WE CARE ABOUT YOU!!!   Continue to follow-up regularly with the urologist, the cardiologist, and the orthopedist as needed for the persistent hip problem. Continue to practice good pulmonary hygiene by drinking plenty of fluids, keep the house as cool as possible, using the coolmist humidifier, and taking Mucinex. We will do the PSA for you today and make sure that the urologist gets a copy of this report. Continue your current medications as you're doing for your heart irregularity, your vitamin D, your  cholesterol, and your allergies.   DArrie SenateMD

## 2014-11-20 LAB — BMP8+EGFR
BUN/Creatinine Ratio: 12 (ref 10–22)
BUN: 12 mg/dL (ref 8–27)
CALCIUM: 9.5 mg/dL (ref 8.6–10.2)
CO2: 26 mmol/L (ref 18–29)
Chloride: 103 mmol/L (ref 97–108)
Creatinine, Ser: 1.04 mg/dL (ref 0.76–1.27)
GFR calc Af Amer: 78 mL/min/{1.73_m2} (ref >59)
GFR calc non Af Amer: 67 mL/min/{1.73_m2} (ref >59)
GLUCOSE: 112 mg/dL — AB (ref 65–99)
POTASSIUM: 4.3 mmol/L (ref 3.5–5.2)
Sodium: 142 mmol/L (ref 134–144)

## 2014-11-20 LAB — NMR, LIPOPROFILE
Cholesterol: 203 mg/dL — ABNORMAL HIGH (ref 100–199)
HDL Cholesterol by NMR: 49 mg/dL (ref >39)
HDL Particle Number: 22.4 umol/L — ABNORMAL LOW (ref >=30.5)
LDL Particle Number: 1534 nmol/L — ABNORMAL HIGH (ref <1000)
LDL Size: 20.9 nm (ref >20.5)
LDL-C: 125 mg/dL — ABNORMAL HIGH (ref 0–99)
SMALL LDL PARTICLE NUMBER: 547 nmol/L — AB (ref <=527)
Triglycerides by NMR: 146 mg/dL (ref 0–149)

## 2014-11-20 LAB — HEPATIC FUNCTION PANEL
ALBUMIN: 4.2 g/dL (ref 3.5–4.7)
ALK PHOS: 56 IU/L (ref 39–117)
ALT: 15 IU/L (ref 0–44)
AST: 15 IU/L (ref 0–40)
BILIRUBIN TOTAL: 0.6 mg/dL (ref 0.0–1.2)
Bilirubin, Direct: 0.13 mg/dL (ref 0.00–0.40)
Total Protein: 6.8 g/dL (ref 6.0–8.5)

## 2014-11-20 LAB — PSA, TOTAL AND FREE
PSA, Free Pct: 22.4 %
PSA, Free: 2.98 ng/mL
PSA: 13.3 ng/mL — ABNORMAL HIGH (ref 0.0–4.0)

## 2014-11-20 LAB — VITAMIN D 25 HYDROXY (VIT D DEFICIENCY, FRACTURES): VIT D 25 HYDROXY: 41.3 ng/mL (ref 30.0–100.0)

## 2014-11-23 ENCOUNTER — Other Ambulatory Visit: Payer: Self-pay | Admitting: Family Medicine

## 2014-11-28 ENCOUNTER — Other Ambulatory Visit (INDEPENDENT_AMBULATORY_CARE_PROVIDER_SITE_OTHER): Payer: Medicare Other

## 2014-11-28 DIAGNOSIS — Z1212 Encounter for screening for malignant neoplasm of rectum: Secondary | ICD-10-CM

## 2014-11-28 NOTE — Progress Notes (Signed)
Lab only 

## 2014-11-30 LAB — FECAL OCCULT BLOOD, IMMUNOCHEMICAL: Fecal Occult Bld: NEGATIVE

## 2014-12-06 ENCOUNTER — Ambulatory Visit (INDEPENDENT_AMBULATORY_CARE_PROVIDER_SITE_OTHER): Payer: Medicare Other | Admitting: Pharmacist

## 2014-12-06 DIAGNOSIS — I482 Chronic atrial fibrillation, unspecified: Secondary | ICD-10-CM

## 2014-12-06 DIAGNOSIS — I4891 Unspecified atrial fibrillation: Secondary | ICD-10-CM

## 2014-12-06 LAB — POCT INR: INR: 1.6

## 2014-12-06 NOTE — Patient Instructions (Signed)
Anticoagulation Dose Instructions as of 12/06/2014      [name] Tue Wed Thu Fri Sat   New Dose 5 mg 2.5 mg 5 mg 5 mg 5 mg 2.5 mg 5 mg    Description        Take 1 and 1/2 tablet today, then  take 1/2 tablet mondays, wednesdays and fridays.  Take 1 tablet all other days.        INR was 1.6 today

## 2015-01-01 ENCOUNTER — Ambulatory Visit (INDEPENDENT_AMBULATORY_CARE_PROVIDER_SITE_OTHER): Payer: Medicare Other | Admitting: Pharmacist Clinician (PhC)/ Clinical Pharmacy Specialist

## 2015-01-01 DIAGNOSIS — I4891 Unspecified atrial fibrillation: Secondary | ICD-10-CM

## 2015-01-01 LAB — POCT INR: INR: 1.8

## 2015-01-01 MED ORDER — ROSUVASTATIN CALCIUM 10 MG PO TABS: 10.0000 mg | ORAL_TABLET | Freq: Every day | ORAL | Status: DC

## 2015-01-01 NOTE — Patient Instructions (Signed)
Anticoagulation Dose Instructions as of 01/01/2015      [name] Tue Wed Thu Fri Sat   New Dose 5 mg 2.5 mg 5 mg 5 mg 5 mg 5 mg 5 mg    Description        Take 1 tablet every day except Monday take a 1/2 tablet

## 2015-01-07 ENCOUNTER — Other Ambulatory Visit: Payer: Self-pay | Admitting: Family Medicine

## 2015-01-21 ENCOUNTER — Encounter (HOSPITAL_COMMUNITY): Payer: Self-pay | Admitting: *Deleted

## 2015-01-21 ENCOUNTER — Observation Stay (HOSPITAL_COMMUNITY)
Admission: EM | Admit: 2015-01-21 | Discharge: 2015-01-23 | Disposition: A | Discharge status: Home / Self Care | Payer: Medicare Other | Attending: Internal Medicine | Admitting: Internal Medicine

## 2015-01-21 ENCOUNTER — Encounter (HOSPITAL_COMMUNITY): Payer: Self-pay | Admitting: Emergency Medicine

## 2015-01-21 ENCOUNTER — Emergency Department (HOSPITAL_COMMUNITY)
Admission: EM | Admit: 2015-01-21 | Discharge: 2015-01-21 | Disposition: A | Discharge status: Home / Self Care | Payer: Medicare Other | Source: Home / Self Care | Attending: Emergency Medicine | Admitting: Emergency Medicine

## 2015-01-21 DIAGNOSIS — Z8711 Personal history of peptic ulcer disease: Secondary | ICD-10-CM | POA: Insufficient documentation

## 2015-01-21 DIAGNOSIS — D62 Acute posthemorrhagic anemia: Secondary | ICD-10-CM | POA: Diagnosis present

## 2015-01-21 DIAGNOSIS — I25810 Atherosclerosis of coronary artery bypass graft(s) without angina pectoris: Secondary | ICD-10-CM | POA: Diagnosis not present

## 2015-01-21 DIAGNOSIS — Z872 Personal history of diseases of the skin and subcutaneous tissue: Secondary | ICD-10-CM

## 2015-01-21 DIAGNOSIS — Z791 Long term (current) use of non-steroidal anti-inflammatories (NSAID): Secondary | ICD-10-CM | POA: Insufficient documentation

## 2015-01-21 DIAGNOSIS — I4891 Unspecified atrial fibrillation: Secondary | ICD-10-CM | POA: Insufficient documentation

## 2015-01-21 DIAGNOSIS — I2510 Atherosclerotic heart disease of native coronary artery without angina pectoris: Secondary | ICD-10-CM | POA: Insufficient documentation

## 2015-01-21 DIAGNOSIS — N390 Urinary tract infection, site not specified: Secondary | ICD-10-CM

## 2015-01-21 DIAGNOSIS — Z792 Long term (current) use of antibiotics: Secondary | ICD-10-CM | POA: Insufficient documentation

## 2015-01-21 DIAGNOSIS — Z85828 Personal history of other malignant neoplasm of skin: Secondary | ICD-10-CM | POA: Insufficient documentation

## 2015-01-21 DIAGNOSIS — K219 Gastro-esophageal reflux disease without esophagitis: Secondary | ICD-10-CM | POA: Insufficient documentation

## 2015-01-21 DIAGNOSIS — I10 Essential (primary) hypertension: Secondary | ICD-10-CM

## 2015-01-21 DIAGNOSIS — Z880 Allergy status to penicillin: Secondary | ICD-10-CM | POA: Insufficient documentation

## 2015-01-21 DIAGNOSIS — Z87891 Personal history of nicotine dependence: Secondary | ICD-10-CM | POA: Insufficient documentation

## 2015-01-21 DIAGNOSIS — R339 Retention of urine, unspecified: Secondary | ICD-10-CM | POA: Diagnosis not present

## 2015-01-21 DIAGNOSIS — Z951 Presence of aortocoronary bypass graft: Secondary | ICD-10-CM

## 2015-01-21 DIAGNOSIS — N400 Enlarged prostate without lower urinary tract symptoms: Secondary | ICD-10-CM | POA: Diagnosis present

## 2015-01-21 DIAGNOSIS — Z7951 Long term (current) use of inhaled steroids: Secondary | ICD-10-CM | POA: Insufficient documentation

## 2015-01-21 DIAGNOSIS — Z8739 Personal history of other diseases of the musculoskeletal system and connective tissue: Secondary | ICD-10-CM | POA: Insufficient documentation

## 2015-01-21 DIAGNOSIS — Z8719 Personal history of other diseases of the digestive system: Secondary | ICD-10-CM | POA: Insufficient documentation

## 2015-01-21 DIAGNOSIS — I499 Cardiac arrhythmia, unspecified: Secondary | ICD-10-CM

## 2015-01-21 DIAGNOSIS — Z79899 Other long term (current) drug therapy: Secondary | ICD-10-CM | POA: Insufficient documentation

## 2015-01-21 DIAGNOSIS — Z862 Personal history of diseases of the blood and blood-forming organs and certain disorders involving the immune mechanism: Secondary | ICD-10-CM | POA: Insufficient documentation

## 2015-01-21 DIAGNOSIS — Z7901 Long term (current) use of anticoagulants: Secondary | ICD-10-CM

## 2015-01-21 DIAGNOSIS — M1990 Unspecified osteoarthritis, unspecified site: Secondary | ICD-10-CM | POA: Insufficient documentation

## 2015-01-21 DIAGNOSIS — R310 Gross hematuria: Secondary | ICD-10-CM | POA: Insufficient documentation

## 2015-01-21 DIAGNOSIS — R319 Hematuria, unspecified: Secondary | ICD-10-CM | POA: Diagnosis not present

## 2015-01-21 DIAGNOSIS — I4819 Other persistent atrial fibrillation: Secondary | ICD-10-CM | POA: Diagnosis present

## 2015-01-21 DIAGNOSIS — R972 Elevated prostate specific antigen [PSA]: Secondary | ICD-10-CM | POA: Diagnosis present

## 2015-01-21 DIAGNOSIS — I482 Chronic atrial fibrillation: Secondary | ICD-10-CM | POA: Diagnosis not present

## 2015-01-21 DIAGNOSIS — N401 Enlarged prostate with lower urinary tract symptoms: Secondary | ICD-10-CM | POA: Diagnosis not present

## 2015-01-21 HISTORY — DX: Benign prostatic hyperplasia without lower urinary tract symptoms: N40.0

## 2015-01-21 LAB — CBC WITH DIFFERENTIAL/PLATELET
Basophils Absolute: 0 10*3/uL (ref 0.0–0.1)
Basophils Relative: 0 % (ref 0–1)
Eosinophils Absolute: 0.4 10*3/uL (ref 0.0–0.7)
Eosinophils Relative: 3 % (ref 0–5)
HCT: 43.6 % (ref 39.0–52.0)
HEMOGLOBIN: 14.2 g/dL (ref 13.0–17.0)
LYMPHS ABS: 3 10*3/uL (ref 0.7–4.0)
LYMPHS PCT: 25 % (ref 12–46)
MCH: 32.4 pg (ref 26.0–34.0)
MCHC: 32.6 g/dL (ref 30.0–36.0)
MCV: 99.5 fL (ref 78.0–100.0)
MONOS PCT: 4 % (ref 3–12)
Monocytes Absolute: 0.5 10*3/uL (ref 0.1–1.0)
NEUTROS ABS: 8.1 10*3/uL — AB (ref 1.7–7.7)
NEUTROS PCT: 68 % (ref 43–77)
PLATELETS: 211 10*3/uL (ref 150–400)
RBC: 4.38 MIL/uL (ref 4.22–5.81)
RDW: 12.7 % (ref 11.5–15.5)
WBC: 12 10*3/uL — AB (ref 4.0–10.5)

## 2015-01-21 LAB — URINALYSIS, ROUTINE W REFLEX MICROSCOPIC
Bilirubin Urine: NEGATIVE
Glucose, UA: NEGATIVE mg/dL
Ketones, ur: NEGATIVE mg/dL
Leukocytes, UA: NEGATIVE
Nitrite: POSITIVE — AB
PROTEIN: 100 mg/dL — AB
Specific Gravity, Urine: <1.005 — ABNORMAL LOW (ref 1.005–1.030)
UROBILINOGEN UA: 0.2 mg/dL (ref 0.0–1.0)
pH: 7.5 (ref 5.0–8.0)

## 2015-01-21 LAB — BASIC METABOLIC PANEL
Anion gap: 8 (ref 5–15)
BUN: 13 mg/dL (ref 6–23)
CO2: 26 mmol/L (ref 19–32)
Calcium: 9.1 mg/dL (ref 8.4–10.5)
Chloride: 105 mmol/L (ref 96–112)
Creatinine, Ser: 0.91 mg/dL (ref 0.50–1.35)
GFR, EST NON AFRICAN AMERICAN: 78 mL/min — AB (ref >90)
Glucose, Bld: 131 mg/dL — ABNORMAL HIGH (ref 70–99)
Potassium: 3.6 mmol/L (ref 3.5–5.1)
SODIUM: 139 mmol/L (ref 135–145)

## 2015-01-21 LAB — I-STAT CHEM 8, ED
BUN: 18 mg/dL (ref 6–23)
CHLORIDE: 103 mmol/L (ref 96–112)
CREATININE: 1.1 mg/dL (ref 0.50–1.35)
Calcium, Ion: 1.16 mmol/L (ref 1.13–1.30)
Glucose, Bld: 108 mg/dL — ABNORMAL HIGH (ref 70–99)
HCT: 44 % (ref 39.0–52.0)
Hemoglobin: 15 g/dL (ref 13.0–17.0)
POTASSIUM: 4.2 mmol/L (ref 3.5–5.1)
Sodium: 138 mmol/L (ref 135–145)
TCO2: 24 mmol/L (ref 0–100)

## 2015-01-21 LAB — URINE MICROSCOPIC-ADD ON

## 2015-01-21 LAB — PROTIME-INR
INR: 2.46 — ABNORMAL HIGH (ref 0.00–1.49)
PROTHROMBIN TIME: 26.8 s — AB (ref 11.6–15.2)

## 2015-01-21 MED ORDER — FUROSEMIDE 20 MG PO TABS
20.0000 mg | ORAL_TABLET | Freq: Every day | ORAL | Status: DC
  Administered 2015-01-22: 20 mg via ORAL
  Filled 2015-01-21: qty 1

## 2015-01-21 MED ORDER — TAMSULOSIN HCL 0.4 MG PO CAPS
0.4000 mg | ORAL_CAPSULE | Freq: Every day | ORAL | Status: DC
  Administered 2015-01-22 – 2015-01-23 (×2): 0.4 mg via ORAL
  Filled 2015-01-21 (×2): qty 1

## 2015-01-21 MED ORDER — CARVEDILOL 12.5 MG PO TABS
12.5000 mg | ORAL_TABLET | Freq: Two times a day (BID) | ORAL | Status: DC
  Administered 2015-01-21 – 2015-01-23 (×4): 12.5 mg via ORAL
  Filled 2015-01-21 (×4): qty 1

## 2015-01-21 MED ORDER — ONDANSETRON HCL 4 MG/2ML IJ SOLN: 4.0000 mg | Freq: Four times a day (QID) | INTRAMUSCULAR | Status: DC | PRN

## 2015-01-21 MED ORDER — DEXTROSE 5 % IV SOLN
1.0000 g | Freq: Once | INTRAVENOUS | Status: AC
  Administered 2015-01-21: 1 g via INTRAVENOUS
  Filled 2015-01-21: qty 10

## 2015-01-21 MED ORDER — ACETAMINOPHEN 650 MG RE SUPP: 650.0000 mg | Freq: Four times a day (QID) | RECTAL | Status: DC | PRN

## 2015-01-21 MED ORDER — DILTIAZEM HCL ER COATED BEADS 120 MG PO CP24
120.0000 mg | ORAL_CAPSULE | Freq: Every day | ORAL | Status: DC
  Administered 2015-01-22 – 2015-01-23 (×2): 120 mg via ORAL
  Filled 2015-01-21 (×2): qty 1

## 2015-01-21 MED ORDER — ONDANSETRON HCL 4 MG PO TABS: 4.0000 mg | ORAL_TABLET | Freq: Four times a day (QID) | ORAL | Status: DC | PRN

## 2015-01-21 MED ORDER — TAMSULOSIN HCL 0.4 MG PO CAPS: 0.4000 mg | ORAL_CAPSULE | Freq: Every day | ORAL | Status: DC

## 2015-01-21 MED ORDER — SODIUM CHLORIDE 0.9 % IJ SOLN
3.0000 mL | Freq: Two times a day (BID) | INTRAMUSCULAR | Status: DC
  Administered 2015-01-21 – 2015-01-22 (×2): 3 mL via INTRAVENOUS

## 2015-01-21 MED ORDER — CIPROFLOXACIN HCL 250 MG PO TABS
500.0000 mg | ORAL_TABLET | Freq: Two times a day (BID) | ORAL | Status: DC
  Administered 2015-01-21 – 2015-01-22 (×2): 500 mg via ORAL
  Filled 2015-01-21 (×2): qty 2

## 2015-01-21 MED ORDER — ROSUVASTATIN CALCIUM 10 MG PO TABS
10.0000 mg | ORAL_TABLET | Freq: Every day | ORAL | Status: DC
  Administered 2015-01-22: 10 mg via ORAL
  Filled 2015-01-21: qty 1

## 2015-01-21 MED ORDER — CIPROFLOXACIN HCL 500 MG PO TABS: 500.0000 mg | ORAL_TABLET | Freq: Two times a day (BID) | ORAL | Status: DC

## 2015-01-21 MED ORDER — ACETAMINOPHEN 325 MG PO TABS: 650.0000 mg | ORAL_TABLET | Freq: Four times a day (QID) | ORAL | Status: DC | PRN

## 2015-01-21 MED ORDER — SODIUM CHLORIDE 0.9 % IV SOLN: 250.0000 mL | INTRAVENOUS | Status: DC | PRN

## 2015-01-21 MED ORDER — SODIUM CHLORIDE 0.9 % IJ SOLN: 3.0000 mL | INTRAMUSCULAR | Status: DC | PRN

## 2015-01-21 MED ORDER — FINASTERIDE 5 MG PO TABS
5.0000 mg | ORAL_TABLET | Freq: Every day | ORAL | Status: DC
  Administered 2015-01-22 – 2015-01-23 (×2): 5 mg via ORAL
  Filled 2015-01-21 (×4): qty 1

## 2015-01-21 NOTE — H&P (Addendum)
PCP:   [name], MD   Chief Complaint:  Urinary retention  HPI: 81 year old male who   has a past medical history of Hypertension; Persistent atrial fibrillation; Coronary artery disease; Ejection fraction < 50%; Anemia; Cellulitis; DJD (degenerative joint disease); GERD (gastroesophageal reflux disease); PUD (peptic ulcer disease); Skin cancer of eyelid; Atrial fibrillation; and Prostate hypertrophy. Today presents to the hospital with chief complaint of inability to urinate. Patient was earlier seen in the  ED, and was diagnosed with urinary retention. Foley catheter was inserted but patient refused to go home with Foley catheter. Patient refused to go home with foley, so foley catheter was removed and patient was voiding and went home. Patient says that he was again unable to urinate so came to the ED. Foley catheter was again inserted and patient arrived at [place] hematuria.urology was consulted by the ED physician, Dr. Junious Silk will be seeing patient in the ED for continuous bladder irrigation. [facility] hospitalist consulted for admission Patient denies dysuria, no chest pain no shortness of breath. No nausea vomiting or diarrhea. Complains of bladder spasms earlier. He has a history of [name]  Allergies:   Allergies  Allergen Reactions  . Penicillins Hives and Rash  . Hct [Hydrochlorothiazide] Other (See Comments)    hyper  . Liptruzet [Ezetimibe-Atorvastatin] Other (See Comments)    myalgia  . Statins Other (See Comments)    myalgia      Past Medical History  Diagnosis Date  . Hypertension   . Persistent atrial fibrillation   . Coronary artery disease     5 bypasses  . Ejection fraction < 50%     Mildly reduced, 40% by echo  . Anemia   . Cellulitis     In past  . DJD (degenerative joint disease)   . GERD (gastroesophageal reflux disease)   . PUD (peptic ulcer disease)   . Skin cancer of eyelid     Resected  . Atrial fibrillation   . Prostate hypertrophy     on CT  scan 09/2014    Past Surgical History  Procedure Laterality Date  . Coronary artery bypass graft  4/05    LIMA to LAD, SVG to PDA, SVG to ramus intermediate  . Heart bypass  2005  . Total knee arthroplasty      Right  . Appendectomy    . Eyelid carcinoma excision      Skin cancer resected  . Total hip arthroplasty      Right  . Bypass graft  2005    Prior to Admission medications   Medication Sig Start Date End Date Taking? Authorizing Provider  acetaminophen (TYLENOL) 650 MG CR tablet Take 650 mg by mouth every 8 (eight) hours as needed for pain.    Yes Historical Provider, MD  carvedilol (COREG) 12.5 MG tablet TAKE ONE TABLET BY MOUTH TWICE DAILY 11/23/14  Yes Chipper Herb, MD  Cholecalciferol (VITAMIN D-3 PO) Take 1 tablet by mouth daily.   Yes Historical Provider, MD  ciprofloxacin (CIPRO) 500 MG tablet Take 1 tablet (500 mg total) by mouth every 12 (twelve) hours. 01/21/15  Yes Merryl Hacker, MD  diclofenac sodium (VOLTAREN) 1 % GEL Apply 1 gram to hands or other affected joints up to 4 times daily 06/08/13  Yes Chipper Herb, MD  diltiazem (CARDIZEM CD) 120 MG 24 hr capsule TAKE ONE (1) CAPSULE EACH DAY 11/23/14  Yes Chipper Herb, MD  furosemide (LASIX) 20 MG tablet Take 1 tablet (20  mg total) by mouth daily. 10/30/13  Yes Chipper Herb, MD  lovastatin (MEVACOR) 20 MG tablet Take 20 mg by mouth at bedtime.   Yes Historical Provider, MD  tamsulosin (FLOMAX) 0.4 MG CAPS capsule Take 1 capsule (0.4 mg total) by mouth daily. 01/21/15  Yes Merryl Hacker, MD  warfarin (COUMADIN) 5 MG tablet TAKE ONE (1) TABLET EACH DAY 01/08/15  Yes Chipper Herb, MD  fluticasone [facility]) 50 MCG/ACT nasal spray Use 1 to 2 sprays in each nostril daily Patient not taking: Reported on 11/19/2014 05/25/14   Chipper Herb, MD  rosuvastatin (CRESTOR) 10 MG tablet Take 1 tablet (10 mg total) by mouth daily. Take 1 tablet on Mondays and Fridays Patient not taking: Reported on 01/21/2015 01/01/15    Memory Argue, PHARMD    Social History:  reports that he quit smoking about 51 years ago. His smoking use included Cigarettes. He has a 10 pack-year smoking history. He has never used smokeless tobacco. He reports that he does not drink alcohol or use illicit drugs.  Family History  Problem Relation Age of Onset  . Stroke Mother   . Stroke Father   . Hypertension Father   . Early death Brother     9 months old  . Cancer Brother     lung  . Stroke Brother     heat     All the positives are listed in BOLD  Review of Systems:  HEENT: Headache, blurred vision, runny nose, sore throat Neck: Hypothyroidism, hyperthyroidism,,lymphadenopathy Chest : Shortness of breath, history of COPD, Asthma Heart : Chest pain, history of coronary arterey disease GI:  Nausea, vomiting, diarrhea, constipation, GERD GU: Dysuria, urgency, frequency of urination, hematuria Neuro: Stroke, seizures, syncope Psych: Depression, anxiety, hallucinations   Physical Exam: Blood pressure 124/77, pulse 111, temperature 98.1 F (36.7 C), temperature source Oral, resp. rate 20, height 5\' 7"  (1.702 m), weight 84.369 kg (186 lb), SpO2 98 %. Constitutional:   Patient is a well-developed and well-nourished male* in no acute distress and cooperative with exam. Head: Normocephalic and atraumatic Mouth: Mucus membranes moist Eyes: PERRL, EOMI, conjunctivae normal Neck: Supple, No Thyromegaly Cardiovascular: RRR, S1 normal, S2 normal Pulmonary/Chest: CTAB, no wheezes, rales, or rhonchi Abdominal: Soft. Non-tender, non-distended, bowel sounds are normal, no masses, organomegaly, or guarding present. Foley catheter in place Neurological: A&O x3, Strength is normal and symmetric bilaterally, cranial nerve II-XII are grossly intact, no focal motor deficit, sensory intact to light touch bilaterally.  Extremities : No Cyanosis, Clubbing or Edema  Labs on Admission:  Basic Metabolic Panel:  Recent Labs Lab  01/21/15 0812 01/21/15 1837  NA 139 138  K 3.6 4.2  CL 105 103  CO2 26  --   GLUCOSE 131* 108*  BUN 13 18  CREATININE 0.91 1.10  CALCIUM 9.1  --    Liver Function Tests: No results for input(s): AST, ALT, ALKPHOS, BILITOT, PROT, ALBUMIN in the last 168 hours. No results for input(s): LIPASE, AMYLASE in the last 168 hours. No results for input(s): AMMONIA in the last 168 hours. CBC:  Recent Labs Lab 01/21/15 0812 01/21/15 1837  WBC 12.0*  --   NEUTROABS 8.1*  --   HGB 14.2 15.0  HCT 43.6 44.0  MCV 99.5  --   PLT 211  --     Radiological Exams on Admission: No results found.    Assessment/Plan Principal Problem:   Urinary retention Active Problems:   Coronary atherosclerosis   FIBRILLATION, ATRIAL  Long term current use of anticoagulant therapy  Urinary retention Foley catheter has been inserted, patient has grossly hematuria. Urology has been consulted by the ED physician, will be seen in the ED tonight for continuous bladder irrigation. Will continue Foley catheter and CBI recommendations as per urology.  Atrial fibrillation Heart rate is controlled, will hold the Coumadin at this time due to the hematuria. Continue with Coreg as well as Cardizem.  Hyperlipidemia Continue statins  ? UTI Patient's UA shows positive nitrite, WBCs. Cipro was started earlier will continue with Cipro 500 twice a day   Code status:full code  Family discussion: Admission, patients condition and plan of care including tests being ordered have been discussed with the patient and *his wife at bedside who indicate understanding and agree with the plan and Code Status.   Time Spent on Admission: 60 min  [facility] Pager: [phone] 01/21/2015, 8:48 PM  If 7PM-7AM, please contact night-coverage  [email or web address]  Password [email or web address]

## 2015-01-21 NOTE — ED Notes (Signed)
Irrigated catheter with 60 ml sterile water. Irrigates with no difficulty but has no output. Irrigated continuously with 450 ml sterile water with 450 ml output, many blood clots noted with irrigation. Patient tolerated well.

## 2015-01-21 NOTE — ED Notes (Signed)
Patient bleeding around catheter site. Verbal order to change foley catheter to larger size catheter.

## 2015-01-21 NOTE — ED Notes (Signed)
Patient urinated 175 mls of frank bloody urine. Bladder scanner done for PVR, 26 mls remain in bladder.

## 2015-01-21 NOTE — ED Provider Notes (Signed)
CSN: [id number]     Arrival date & time 01/21/15  5284 History   First MD Initiated Contact with Patient 01/21/15 [phone]     Chief Complaint  Patient presents with  . Urinary Retention     (Consider location/radiation/quality/duration/timing/severity/associated sxs/prior Treatment) HPI  This is an 81 year old male with a history of hypertension, atrial fibrillation on Coumadin, coronary artery disease who presents with urinary retention. Patient reports that he has not urinated since last night. He has noted a slow stream for several days. Never had difficulty urinating in the past. He does see Dr. Jeffie Pollock, urology once a year. He reported abdominal discomfort after not being able to urinate; however that has improved with Foley catheter placement. Denies any flank pain or fevers. No known history of [name].  Past Medical History  Diagnosis Date  . Hypertension   . Persistent atrial fibrillation   . Coronary artery disease     5 bypasses  . Ejection fraction < 50%     Mildly reduced, 40% by echo  . Anemia   . Cellulitis     In past  . DJD (degenerative joint disease)   . GERD (gastroesophageal reflux disease)   . PUD (peptic ulcer disease)   . Skin cancer of eyelid     Resected  . Atrial fibrillation    Past Surgical History  Procedure Laterality Date  . Coronary artery bypass graft  4/05    LIMA to LAD, SVG to PDA, SVG to ramus intermediate  . Heart bypass  2005  . Total knee arthroplasty      Right  . Appendectomy    . Eyelid carcinoma excision      Skin cancer resected  . Total hip arthroplasty      Right  . Bypass graft  2005   Family History  Problem Relation Age of Onset  . Stroke Mother   . Stroke Father   . Hypertension Father   . Early death Brother     42 months old  . Cancer Brother     lung  . Stroke Brother     heat   History  Substance Use Topics  . Smoking status: Former Smoker -- 2.00 packs/day for 5 years    Types: Cigarettes    Quit date:  12/23/1963  . Smokeless tobacco: Never Used  . Alcohol Use: No    Review of Systems  Constitutional: Negative.  Negative for fever.  Respiratory: Negative.  Negative for chest tightness and shortness of breath.   Cardiovascular: Negative.  Negative for chest pain.  Gastrointestinal: Positive for abdominal pain. Negative for nausea and vomiting.  Genitourinary: Positive for difficulty urinating. Negative for dysuria and flank pain.  Musculoskeletal: Negative for back pain.  Neurological: Negative for headaches.  All other systems reviewed and are negative.     Allergies  Penicillins; Hct; Liptruzet; and Statins  Home Medications   Prior to Admission medications   Medication Sig Start Date End Date Taking? Authorizing Provider  acetaminophen (TYLENOL) 650 MG CR tablet Take 650 mg by mouth every 8 (eight) hours as needed for pain.    Yes Historical Provider, MD  carvedilol (COREG) 12.5 MG tablet TAKE ONE TABLET BY MOUTH TWICE DAILY 11/23/14  Yes Chipper Herb, MD  Cholecalciferol (VITAMIN D-3 PO) Take 1 tablet by mouth daily.   Yes Historical Provider, MD  diclofenac sodium (VOLTAREN) 1 % GEL Apply 1 gram to hands or other affected joints up to 4 times daily 06/08/13  Yes Chipper Herb, MD  diltiazem [facility] CD) 120 MG 24 hr capsule TAKE ONE (1) CAPSULE EACH DAY 11/23/14  Yes Chipper Herb, MD  furosemide (LASIX) 20 MG tablet Take 1 tablet (20 mg total) by mouth daily. 10/30/13  Yes Chipper Herb, MD  lovastatin (MEVACOR) 20 MG tablet Take 20 mg by mouth at bedtime.   Yes Historical Provider, MD  warfarin (COUMADIN) 5 MG tablet TAKE ONE (1) TABLET EACH DAY 01/08/15  Yes Chipper Herb, MD  ciprofloxacin (CIPRO) 500 MG tablet Take 1 tablet (500 mg total) by mouth every 12 (twelve) hours. 01/21/15   Merryl Hacker, MD  fluticasone [facility]) 50 MCG/ACT nasal spray Use 1 to 2 sprays in each nostril daily Patient not taking: Reported on 11/19/2014 05/25/14   Chipper Herb, MD   rosuvastatin (CRESTOR) 10 MG tablet Take 1 tablet (10 mg total) by mouth daily. Take 1 tablet on Mondays and Fridays Patient not taking: Reported on 01/21/2015 01/01/15   Memory Argue, PHARMD  tamsulosin (FLOMAX) 0.4 MG CAPS capsule Take 1 capsule (0.4 mg total) by mouth daily. 01/21/15   Merryl Hacker, MD   BP 164/98 mmHg  Pulse 95  Temp(Src) 97.7 F (36.5 C) (Oral)  Resp 16  SpO2 98% Physical Exam  Constitutional: He is oriented to person, place, and time. He appears well-developed and well-nourished.  elderly  HENT:  Head: Normocephalic and atraumatic.  Mouth/Throat: Oropharynx is clear and moist.  Eyes: Pupils are equal, round, and reactive to light.  Cardiovascular: Normal rate and normal heart sounds.   No murmur heard. Irregular rhythm  Pulmonary/Chest: Effort normal and breath sounds normal. No respiratory distress. He has no wheezes.  Abdominal: Soft. Bowel sounds are normal. There is no tenderness. There is no rebound.  Genitourinary:  Foley in place with pink urine in bag  Musculoskeletal: He exhibits no edema.  Neurological: He is alert and oriented to person, place, and time.  Skin: Skin is warm and dry.  Psychiatric: He has a normal mood and affect.  Nursing note and vitals reviewed.   ED Course  Procedures (including critical care time) Labs Review Labs Reviewed  URINALYSIS, ROUTINE W REFLEX MICROSCOPIC - Abnormal; Notable for the following:    Color, Urine RED (*)    APPearance CLOUDY (*)    Specific Gravity, Urine <1.005 (*)    Hgb urine dipstick LARGE (*)    Protein, ur 100 (*)    Nitrite POSITIVE (*)    All other components within normal limits  CBC WITH DIFFERENTIAL/PLATELET - Abnormal; Notable for the following:    WBC 12.0 (*)    Neutro Abs 8.1 (*)    All other components within normal limits  BASIC METABOLIC PANEL - Abnormal; Notable for the following:    Glucose, Bld 131 (*)    GFR calc non Af Amer 78 (*)    All other components within  normal limits  PROTIME-INR - Abnormal; Notable for the following:    Prothrombin Time 26.8 (*)    INR 2.46 (*)    All other components within normal limits  URINE MICROSCOPIC-ADD ON - Abnormal; Notable for the following:    Bacteria, UA MANY (*)    All other components within normal limits  URINE CULTURE    Imaging Review No results found.   EKG Interpretation None      MDM   Final diagnoses:  UTI (lower urinary tract infection)  Urinary retention    Patient  presents with urinary retention. Nontoxic on exam. Foley catheter in place draining bloody urine. Basic labwork obtained. Notable for nitrite-positive urine. Culture sent. Patient given IV Rocephin.  Mild leukocytosis but patient denies fever or flank pain. Discuss with patient antibiotics at home and continuation of Foley catheter. Patient would like fully catheter removed. He understands that this person at risk for recurrent urinary retention. He stated understanding but states that he does not want to have a catheter. Catheter was pulled and patient was able to void spontaneously a proximal 180 mL of bloody urine. Postvoid residual 20 mL. Discuss with patient and his wife strict return precautions including urinary retention, development of fever or flank pain or any other new or worsening symptoms. He is to follow-up with Dr. Jeffie Pollock.  He will be given ciprofloxacin for his UTI.  After history, exam, and medical workup I feel the patient has been appropriately medically screened and is safe for discharge home. Pertinent diagnoses were discussed with the patient. Patient was given return precautions.     Merryl Hacker, MD 01/21/15 1259

## 2015-01-21 NOTE — ED Provider Notes (Signed)
CSN: [id number]     Arrival date & time 01/21/15  1659 History   First MD Initiated Contact with Patient 01/21/15 1714     Chief Complaint  Patient presents with  . Urinary Retention     HPI Pt was seen at 1735. Per pt, c/o gradual onset and persistence of constant urinary retention for the past several hours. Pt states he was seen in the ED earlier today for urinary retention and hematuria, had a foley catheter placed and then removed per his request. Pt states he only urinated "just a little bit 2 times" since ED discharge at 1300. Pt states he "thinks I need that catheter again."  Denies flank pain, no fevers, no abd pain, no N/V/D, no testicular pain/swelling, no rash.   Uro: Dr. Jeffie Pollock Past Medical History  Diagnosis Date  . Hypertension   . Persistent atrial fibrillation   . Coronary artery disease     5 bypasses  . Ejection fraction < 50%     Mildly reduced, 40% by echo  . Anemia   . Cellulitis     In past  . DJD (degenerative joint disease)   . GERD (gastroesophageal reflux disease)   . PUD (peptic ulcer disease)   . Skin cancer of eyelid     Resected  . Atrial fibrillation   . Prostate hypertrophy     on CT scan 09/2014   Past Surgical History  Procedure Laterality Date  . Coronary artery bypass graft  4/05    LIMA to LAD, SVG to PDA, SVG to ramus intermediate  . Heart bypass  2005  . Total knee arthroplasty      Right  . Appendectomy    . Eyelid carcinoma excision      Skin cancer resected  . Total hip arthroplasty      Right  . Bypass graft  2005   Family History  Problem Relation Age of Onset  . Stroke Mother   . Stroke Father   . Hypertension Father   . Early death Brother     60 months old  . Cancer Brother     lung  . Stroke Brother     heat   History  Substance Use Topics  . Smoking status: Former Smoker -- 2.00 packs/day for 5 years    Types: Cigarettes    Quit date: 12/23/1963  . Smokeless tobacco: Never Used  . Alcohol Use: No     Review of Systems ROS: Statement: All systems negative except as marked or noted in the HPI; Constitutional: Negative for fever and chills. ; ; Eyes: Negative for eye pain, redness and discharge. ; ; ENMT: Negative for ear pain, hoarseness, nasal congestion, sinus pressure and sore throat. ; ; Cardiovascular: Negative for chest pain, palpitations, diaphoresis, dyspnea and peripheral edema. ; ; Respiratory: Negative for cough, wheezing and stridor. ; ; Gastrointestinal: Negative for nausea, vomiting, diarrhea, abdominal pain, blood in stool, hematemesis, jaundice and rectal bleeding. . ; ; Genitourinary: Negative for dysuria, flank pain and +urinary retention, +hematuria. ; ; Genital:  No penile drainage or rash, no testicular pain or swelling, no scrotal rash or swelling. ;; Musculoskeletal: Negative for back pain and neck pain. Negative for swelling and trauma.; ; Skin: Negative for pruritus, rash, abrasions, blisters, bruising and skin lesion.; ; Neuro: Negative for headache, lightheadedness and neck stiffness. Negative for weakness, altered level of consciousness , altered mental status, extremity weakness, paresthesias, involuntary movement, seizure and syncope.     Allergies  Penicillins; Hct; Liptruzet; and Statins  Home Medications   Prior to Admission medications   Medication Sig Start Date End Date Taking? Authorizing Provider  acetaminophen (TYLENOL) 650 MG CR tablet Take 650 mg by mouth every 8 (eight) hours as needed for pain.    Yes Historical Provider, MD  carvedilol (COREG) 12.5 MG tablet TAKE ONE TABLET BY MOUTH TWICE DAILY 11/23/14  Yes Chipper Herb, MD  Cholecalciferol (VITAMIN D-3 PO) Take 1 tablet by mouth daily.   Yes Historical Provider, MD  ciprofloxacin (CIPRO) 500 MG tablet Take 1 tablet (500 mg total) by mouth every 12 (twelve) hours. 01/21/15  Yes Merryl Hacker, MD  diclofenac sodium (VOLTAREN) 1 % GEL Apply 1 gram to hands or other affected joints up to 4 times  daily 06/08/13  Yes Chipper Herb, MD  diltiazem [facility] CD) 120 MG 24 hr capsule TAKE ONE (1) CAPSULE EACH DAY 11/23/14  Yes Chipper Herb, MD  furosemide (LASIX) 20 MG tablet Take 1 tablet (20 mg total) by mouth daily. 10/30/13  Yes Chipper Herb, MD  lovastatin (MEVACOR) 20 MG tablet Take 20 mg by mouth at bedtime.   Yes Historical Provider, MD  tamsulosin (FLOMAX) 0.4 MG CAPS capsule Take 1 capsule (0.4 mg total) by mouth daily. 01/21/15  Yes Merryl Hacker, MD  warfarin (COUMADIN) 5 MG tablet TAKE ONE (1) TABLET EACH DAY 01/08/15  Yes Chipper Herb, MD  fluticasone [facility]) 50 MCG/ACT nasal spray Use 1 to 2 sprays in each nostril daily Patient not taking: Reported on 11/19/2014 05/25/14   Chipper Herb, MD  rosuvastatin (CRESTOR) 10 MG tablet Take 1 tablet (10 mg total) by mouth daily. Take 1 tablet on Mondays and Fridays Patient not taking: Reported on 01/21/2015 01/01/15   Memory Argue, PHARMD   BP 124/81 mmHg  Pulse 115  Temp(Src) 97.6 F (36.4 C) (Oral)  Resp 18  Ht 5\' 7"  (1.702 m)  Wt 186 lb (84.369 kg)  BMI 29.12 kg/m2  SpO2 100% Physical Exam  1740: Physical examination:  Nursing notes reviewed; Vital signs and O2 SAT reviewed;  Constitutional: Well developed, Well nourished, Well hydrated, In no acute distress; Head:  Normocephalic, atraumatic; Eyes: EOMI, PERRL, No scleral icterus; ENMT: Mouth and pharynx normal, Mucous membranes moist; Neck: Supple, Full range of motion, No lymphadenopathy; Cardiovascular: Tachycardic rate and irregular irregular rhythm, No gallop; Respiratory: Breath sounds clear & equal bilaterally, No wheezes.  Speaking full sentences with ease, Normal respiratory effort/excursion; Chest: Nontender, Movement normal; Abdomen: Soft, Nontender, Nondistended, Normal bowel sounds; Genitourinary: +gross hematuria with large clots in foley tubing and bag.  No CVA tenderness; Extremities: Pulses normal, No tenderness, No edema, No calf edema or asymmetry.;  Neuro: AA&Ox3, Major CN grossly intact.  Speech clear. No gross focal motor or sensory deficits in extremities.; Skin: Color normal, Warm, Dry.   ED Course  Procedures     EKG Interpretation None      MDM  MDM Reviewed: previous chart, nursing note and vitals Reviewed previous: labs Interpretation: labs     Results for orders placed or performed during the hospital encounter of 01/21/15  Urinalysis, Routine w reflex microscopic  Result Value Ref Range   Color, Urine RED (A) YELLOW   APPearance CLOUDY (A) CLEAR   Specific Gravity, Urine <1.005 (L) 1.005 - 1.030   pH 7.5 5.0 - 8.0   Glucose, UA NEGATIVE NEGATIVE mg/dL   Hgb urine dipstick LARGE (A) NEGATIVE   Bilirubin Urine  NEGATIVE NEGATIVE   Ketones, ur NEGATIVE NEGATIVE mg/dL   Protein, ur 100 (A) NEGATIVE mg/dL   Urobilinogen, UA 0.2 0.0 - 1.0 mg/dL   Nitrite POSITIVE (A) NEGATIVE   Leukocytes, UA NEGATIVE NEGATIVE  CBC with Differential  Result Value Ref Range   WBC 12.0 (H) 4.0 - 10.5 K/uL   RBC 4.38 4.22 - 5.81 MIL/uL   Hemoglobin 14.2 13.0 - 17.0 g/dL   HCT 43.6 39.0 - 52.0 %   MCV 99.5 78.0 - 100.0 fL   MCH 32.4 26.0 - 34.0 pg   MCHC 32.6 30.0 - 36.0 g/dL   RDW 12.7 11.5 - 15.5 %   Platelets 211 150 - 400 K/uL   Neutrophils Relative % 68 43 - 77 %   Neutro Abs 8.1 (H) 1.7 - 7.7 K/uL   Lymphocytes Relative 25 12 - 46 %   Lymphs Abs 3.0 0.7 - 4.0 K/uL   Monocytes Relative 4 3 - 12 %   Monocytes Absolute 0.5 0.1 - 1.0 K/uL   Eosinophils Relative 3 0 - 5 %   Eosinophils Absolute 0.4 0.0 - 0.7 K/uL   Basophils Relative 0 0 - 1 %   Basophils Absolute 0.0 0.0 - 0.1 K/uL  Basic metabolic panel  Result Value Ref Range   Sodium 139 135 - 145 mmol/L   Potassium 3.6 3.5 - 5.1 mmol/L   Chloride 105 96 - 112 mmol/L   CO2 26 19 - 32 mmol/L   Glucose, Bld 131 (H) 70 - 99 mg/dL   BUN 13 6 - 23 mg/dL   Creatinine, Ser 0.91 0.50 - 1.35 mg/dL   Calcium 9.1 8.4 - 10.5 mg/dL   GFR calc non Af Amer 78 (L) >90 mL/min    GFR calc Af Amer >90 >90 mL/min   Anion gap 8 5 - 15  Protime-INR  Result Value Ref Range   Prothrombin Time 26.8 (H) 11.6 - 15.2 seconds   INR 2.46 (H) 0.00 - 1.49  Urine microscopic-add on  Result Value Ref Range   RBC / HPF TOO NUMEROUS TO COUNT <3 RBC/hpf   Bacteria, UA MANY (A) RARE   Results for orders placed or performed during the hospital encounter of 01/21/15  I-stat Chem 8, ED  Result Value Ref Range   Sodium 138 135 - 145 mmol/L   Potassium 4.2 3.5 - 5.1 mmol/L   Chloride 103 96 - 112 mmol/L   BUN 18 6 - 23 mg/dL   Creatinine, Ser 1.10 0.50 - 1.35 mg/dL   Glucose, Bld 108 (H) 70 - 99 mg/dL   Calcium, Ion 1.16 1.13 - 1.30 mmol/L   TCO2 24 0 - 100 mmol/L   Hemoglobin 15.0 13.0 - 17.0 g/dL   HCT 44.0 39.0 - 52.0 %    1900:  Pt voiding hematuria around initial foley catheter placed; this was changed to a larger catheter (18 gauge) with improvement. H/H and BP appear stable at this time. Pt passing very large clots through foley tubing and into foley bag, clotting tubing and making it difficult to empty foley bag. ED RN tried to flush foley several times, initially irrigated easily but no output. ED RN again irrigated with larger amount of sterile water and removed several large clots.  T/C to Uro Dr. Junious Silk, case discussed, including:  HPI, pertinent PM/SHx, VS/PE, dx testing, ED course and treatment:  Agreeable to consult, pt will need CBI, he requests to obtain cath cart from the OR, admit to medicine  service.    2030:  T/C to [facility] Dr. Darrick Meigs, case discussed, including:  HPI, pertinent PM/SHx, VS/PE, dx testing, ED course and treatment:  Agreeable to admit, requests to write temporary orders, obtain medical bed to team APAdmits.         Francine Graven, DO 01/24/15 [id number]

## 2015-01-21 NOTE — Discharge Instructions (Signed)
Acute Urinary Retention Acute urinary retention is the temporary inability to urinate. This is a common problem in older men. As men age their prostates become larger and block the flow of urine from the bladder. This is usually a problem that has come on gradually.  HOME CARE INSTRUCTIONS If you are sent home with a Foley catheter and a drainage system, you will need to discuss the best course of action with your health care provider. While the catheter is in, maintain a good intake of fluids. Keep the drainage bag emptied and lower than your catheter. This is so that contaminated urine will not flow back into your bladder, which could lead to a urinary tract infection. There are two main types of drainage bags. One is a large bag that usually is used at night. It has a good capacity that will allow you to sleep through the night without having to empty it. The second type is called a leg bag. It has a smaller capacity, so it needs to be emptied more frequently. However, the main advantage is that it can be attached by a leg strap and can go underneath your clothing, allowing you the freedom to move about or leave your home. Only take over-the-counter or prescription medicines for pain, discomfort, or fever as directed by your health care provider.  SEEK MEDICAL CARE IF:  You develop a low-grade fever.  You experience spasms or leakage of urine with the spasms. SEEK IMMEDIATE MEDICAL CARE IF:   You develop chills or fever.  Your catheter stops draining urine.  Your catheter falls out.  You start to develop increased bleeding that does not respond to rest and increased fluid intake. MAKE SURE YOU:  Understand these instructions.  Will watch your condition.  Will get help right away if you are not doing well or get worse. Document Released: 01/04/2001 Document Revised: 10/03/2013 Document Reviewed: 03/09/2013 [facility] Patient Information 2015 [place], [place]. This information is not  intended to replace advice given to you by your health care provider. Make sure you discuss any questions you have with your health care provider. Urinary Tract Infection Urinary tract infections (UTIs) can develop anywhere along your urinary tract. Your urinary tract is your body's drainage system for removing wastes and extra water. Your urinary tract includes two kidneys, two ureters, a bladder, and a urethra. Your kidneys are a pair of bean-shaped organs. Each kidney is about the size of your fist. They are located below your ribs, one on each side of your spine. CAUSES Infections are caused by microbes, which are microscopic organisms, including fungi, viruses, and bacteria. These organisms are so small that they can only be seen through a microscope. Bacteria are the microbes that most commonly cause UTIs. SYMPTOMS  Symptoms of UTIs may vary by age and gender of the patient and by the location of the infection. Symptoms in young women typically include a frequent and intense urge to urinate and a painful, burning feeling in the bladder or urethra during urination. Older women and men are more likely to be tired, shaky, and weak and have muscle aches and abdominal pain. A fever may mean the infection is in your kidneys. Other symptoms of a kidney infection include pain in your back or sides below the ribs, nausea, and vomiting. DIAGNOSIS To diagnose a UTI, your caregiver will ask you about your symptoms. Your caregiver also will ask to provide a urine sample. The urine sample will be tested for bacteria and white blood  cells. White blood cells are made by your body to help fight infection. TREATMENT  Typically, UTIs can be treated with medication. Because most UTIs are caused by a bacterial infection, they usually can be treated with the use of antibiotics. The choice of antibiotic and length of treatment depend on your symptoms and the type of bacteria causing your infection. HOME CARE  INSTRUCTIONS  If you were prescribed antibiotics, take them exactly as your caregiver instructs you. Finish the medication even if you feel better after you have only taken some of the medication.  Drink enough water and fluids to keep your urine clear or pale yellow.  Avoid caffeine, tea, and carbonated beverages. They tend to irritate your bladder.  Empty your bladder often. Avoid holding urine for long periods of time.  Empty your bladder before and after sexual intercourse.  After a bowel movement, women should cleanse from front to back. Use each tissue only once. SEEK MEDICAL CARE IF:   You have back pain.  You develop a fever.  Your symptoms do not begin to resolve within 3 days. SEEK IMMEDIATE MEDICAL CARE IF:   You have severe back pain or lower abdominal pain.  You develop chills.  You have nausea or vomiting.  You have continued burning or discomfort with urination. MAKE SURE YOU:   Understand these instructions.  Will watch your condition.  Will get help right away if you are not doing well or get worse. Document Released: 07/08/2005 Document Revised: 03/29/2012 Document Reviewed: 11/06/2011 [facility] Patient Information 2015 [place], [place]. This information is not intended to replace advice given to you by your health care provider. Make sure you discuss any questions you have with your health care provider.

## 2015-01-21 NOTE — Consult Note (Addendum)
Consultation: Gross hematuria, [name], urinary retention Requested by: Dr. Thurnell Garbe  History of Present Illness: This is an 81 year old male with a history of [name] and microscopic hematuria presented earlier this morning and urinary retention. A Foley catheter was placed in the he was noted to have gross hematuria and clot retention. The bladder was irrigated and the catheter removed. He was discharged to home. He returned again with inability to urinate. Another catheter was placed and urology was consult.  Over the past few weeks the patient has been bothered by weak stream and straining to urinate. He is not on medical therapy for [name]. He was started on tamsulosin today.  Patient has a history of [name], elevated PSA, nodular prostate and microscopic hematuria. He was evaluated last fall for [name] and elevated PSA in December 2015 for microscopic hematuria. A CT hematuria protocol was benign apart from a large prostate which I estimated to be about 160 g. The bladder was somewhat obscured by the artifact from his hip replacement. The patient did not undergo cystoscopy as he had no irritative voiding symptoms and no gross hematuria.  Past Medical History  Diagnosis Date  . Hypertension   . Persistent atrial fibrillation   . Coronary artery disease     5 bypasses  . Ejection fraction < 50%     Mildly reduced, 40% by echo  . Anemia   . Cellulitis     In past  . DJD (degenerative joint disease)   . GERD (gastroesophageal reflux disease)   . PUD (peptic ulcer disease)   . Skin cancer of eyelid     Resected  . Atrial fibrillation   . Prostate hypertrophy     on CT scan 09/2014   Past Surgical History  Procedure Laterality Date  . Coronary artery bypass graft  4/05    LIMA to LAD, SVG to PDA, SVG to ramus intermediate  . Heart bypass  2005  . Total knee arthroplasty      Right  . Appendectomy    . Eyelid carcinoma excision      Skin cancer resected  . Total hip arthroplasty      Right  .  Bypass graft  2005    Home Medications:   (Not in a hospital admission) Allergies:  Allergies  Allergen Reactions  . Penicillins Hives and Rash  . Hct [Hydrochlorothiazide] Other (See Comments)    hyper  . Liptruzet [Ezetimibe-Atorvastatin] Other (See Comments)    myalgia  . Statins Other (See Comments)    myalgia    Family History  Problem Relation Age of Onset  . Stroke Mother   . Stroke Father   . Hypertension Father   . Early death Brother     84 months old  . Cancer Brother     lung  . Stroke Brother     heat   Social History:  reports that he quit smoking about 51 years ago. His smoking use included Cigarettes. He has a 10 pack-year smoking history. He has never used smokeless tobacco. He reports that he does not drink alcohol or use illicit drugs.  ROS: A complete review of systems was performed.  All systems are negative except for pertinent findings as noted. Review of Systems  All other systems reviewed and are negative.    Physical Exam:  Vital signs in last 24 hours: Temp:  [97.6 F (36.4 C)-98.1 F (36.7 C)] 98.1 F (36.7 C) (04/11 1859) Pulse Rate:  [91-115] 111 (04/11 1859) Resp:  [  16-20] 20 (04/11 1859) BP: (124-164)/(77-98) 124/77 mmHg (04/11 1859) SpO2:  [97 %-100 %] 98 % (04/11 1859) Weight:  [84.369 kg (186 lb)] 84.369 kg (186 lb) (04/11 1711) General:  Alert and oriented, No acute distress HEENT: Normocephalic, atraumatic Neck: No JVD or lymphadenopathy Cardiovascular: Regular rate and rhythm Lungs: Regular rate and effort Abdomen: Soft, nontender, nondistended, no abdominal masses Back: No CVA tenderness Extremities: No edema Neurologic: Grossly intact GU: Bladder not distended, 18 French Foley in place. Catheter is mobile. Some bleeding/clot coming out around the catheter.  Procedure: The bladder was irrigated with several bottles of water. The clots were small and quickly were evacuated. The irrigation was light pink and there did  not appear to be significant ongoing bleeding.  Laboratory Data:  Results for orders placed or performed during the hospital encounter of 01/21/15 (from the past 24 hour(s))  I-stat Chem 8, ED     Status: Abnormal   Collection Time: 01/21/15  6:37 PM  Result Value Ref Range   Sodium 138 135 - 145 mmol/L   Potassium 4.2 3.5 - 5.1 mmol/L   Chloride 103 96 - 112 mmol/L   BUN 18 6 - 23 mg/dL   Creatinine, Ser 1.10 0.50 - 1.35 mg/dL   Glucose, Bld 108 (H) 70 - 99 mg/dL   Calcium, Ion 1.16 1.13 - 1.30 mmol/L   TCO2 24 0 - 100 mmol/L   Hemoglobin 15.0 13.0 - 17.0 g/dL   HCT 44.0 39.0 - 52.0 %   No results found for this or any previous visit (from the past 240 hour(s)). Creatinine:  Recent Labs  01/21/15 0812 01/21/15 1837  CREATININE 0.91 1.10    Impression/Assessment/plan: Gross hematuria - agree with holding coumadin until clear. Patient with negative CT scan December 2015. This is likely prostate in origin. Patient will likely need cystoscopy in the outpatient. Continue Foley catheter until urine clear.  [name] with urinary retention-Recommend starting finasteride. Continue tamsulosin. Finasteride may prevent future gross hematuria and urinary retention episodes. Discussed with patient and his wife nature risks benefits and alternatives to 5 alpha reductase inhibitors in the rationale for combination therapy.  Elevated PSA, nodular prostate-recent evaluation without significant concern for advanced prostate cancer therefore surveillance was continued.  I'll notify Dr. Jeffie Pollock of admission.    Falicity Sheets 01/21/2015, 9:29 PM   ADDENDUM: Pt was transported upstairs and transferred to hospital bed. Urine was light red. I irrigated foley and it was clear with two syringes, minimal clots. I don't feel he needs CBI at this point. I have shown nurse how to irrigate foley.  Kin Galbraith 01/21/2015, 10:06 PM

## 2015-01-21 NOTE — ED Notes (Signed)
Ginger ale given. Awaiting Voiding trial. Pt informed to contact RN when he feels full and ready to void.

## 2015-01-21 NOTE — ED Notes (Signed)
Patient with no complaints at this time. Respirations even and unlabored. Skin warm/dry. Discharge instructions reviewed with patient at this time. Patient given opportunity to voice concerns/ask questions. Patient discharged at this time and left Emergency Department with steady gait.   

## 2015-01-21 NOTE — ED Notes (Signed)
States pain is decreased after foley insertion.

## 2015-01-21 NOTE — ED Notes (Signed)
Patient complaining of urinary retention since this morning. States he was treated here earlier today for same. States they put in a catheter but removed it prior to discharge. States he did urinate on his own prior to discharge.

## 2015-01-21 NOTE — ED Notes (Signed)
Pt states he has had slow stream for a few days, unable to urinate this morning, this nurse placed a 66F foley, blood and urine immediately returned, pt has drained approx 721ml red urine.

## 2015-01-22 DIAGNOSIS — N390 Urinary tract infection, site not specified: Secondary | ICD-10-CM | POA: Diagnosis not present

## 2015-01-22 DIAGNOSIS — D62 Acute posthemorrhagic anemia: Secondary | ICD-10-CM | POA: Diagnosis not present

## 2015-01-22 DIAGNOSIS — R339 Retention of urine, unspecified: Secondary | ICD-10-CM | POA: Diagnosis not present

## 2015-01-22 DIAGNOSIS — R310 Gross hematuria: Secondary | ICD-10-CM | POA: Diagnosis not present

## 2015-01-22 DIAGNOSIS — R319 Hematuria, unspecified: Secondary | ICD-10-CM | POA: Diagnosis not present

## 2015-01-22 DIAGNOSIS — I4891 Unspecified atrial fibrillation: Secondary | ICD-10-CM

## 2015-01-22 LAB — CBC
HCT: 38.3 % — ABNORMAL LOW (ref 39.0–52.0)
HCT: 37.7 % — ABNORMAL LOW (ref 39.0–52.0)
HEMOGLOBIN: 12.7 g/dL — AB (ref 13.0–17.0)
Hemoglobin: 12.4 g/dL — ABNORMAL LOW (ref 13.0–17.0)
MCH: 32.8 pg (ref 26.0–34.0)
MCH: 32.9 pg (ref 26.0–34.0)
MCHC: 33.2 g/dL (ref 30.0–36.0)
MCHC: 32.9 g/dL (ref 30.0–36.0)
MCV: 99 fL (ref 78.0–100.0)
MCV: 100 fL (ref 78.0–100.0)
PLATELETS: 193 10*3/uL (ref 150–400)
Platelets: 205 10*3/uL (ref 150–400)
RBC: 3.87 MIL/uL — AB (ref 4.22–5.81)
RBC: 3.77 MIL/uL — AB (ref 4.22–5.81)
RDW: 12.9 % (ref 11.5–15.5)
RDW: 12.9 % (ref 11.5–15.5)
WBC: 16.9 10*3/uL — AB (ref 4.0–10.5)
WBC: 16.5 10*3/uL — AB (ref 4.0–10.5)

## 2015-01-22 LAB — URINE CULTURE
Colony Count: NO GROWTH
Culture: NO GROWTH

## 2015-01-22 LAB — COMPREHENSIVE METABOLIC PANEL
ALBUMIN: 3.5 g/dL (ref 3.5–5.2)
ALK PHOS: 50 U/L (ref 39–117)
ALT: 13 U/L (ref 0–53)
ANION GAP: 10 (ref 5–15)
AST: 16 U/L (ref 0–37)
BUN: 20 mg/dL (ref 6–23)
CO2: 26 mmol/L (ref 19–32)
Calcium: 8.9 mg/dL (ref 8.4–10.5)
Chloride: 104 mmol/L (ref 96–112)
Creatinine, Ser: 1.14 mg/dL (ref 0.50–1.35)
GFR calc non Af Amer: 59 mL/min — ABNORMAL LOW (ref >90)
GFR, EST AFRICAN AMERICAN: 68 mL/min — AB (ref >90)
Glucose, Bld: 118 mg/dL — ABNORMAL HIGH (ref 70–99)
Potassium: 3.8 mmol/L (ref 3.5–5.1)
Sodium: 140 mmol/L (ref 135–145)
Total Bilirubin: 1 mg/dL (ref 0.3–1.2)
Total Protein: 6.4 g/dL (ref 6.0–8.3)

## 2015-01-22 LAB — PROTIME-INR
INR: 2.25 — AB (ref 0.00–1.49)
Prothrombin Time: 25.1 seconds — ABNORMAL HIGH (ref 11.6–15.2)

## 2015-01-22 MED ORDER — WARFARIN - PHYSICIAN DOSING INPATIENT: Freq: Every day | Status: DC

## 2015-01-22 MED ORDER — SODIUM CHLORIDE 0.9 % IV SOLN
INTRAVENOUS | Status: DC
  Administered 2015-01-22: 75 mL/h via INTRAVENOUS

## 2015-01-22 NOTE — Progress Notes (Signed)
Patient's foley catheter was removed per doctor order.A small clot and scant blood was noted with removal. Patient tolerated removal well and voided a scant amount of pink tinged urine post removal. Patient was given a urinal for voiding and was aware of how to use it. Patient status will continue to be monitored.

## 2015-01-22 NOTE — Progress Notes (Signed)
[facility] PROGRESS NOTE  [name] [id number] DOB: May 09, 1932 DOA: 01/21/2015 PCP: [name], MD  Assessment/Plan: Urinary retention Foley catheter inserted via urology last night. Finasteride per urology.  Continues with gross hematuria. Few clots irrigated per nursing staff. Will continue Foley catheter and every 2 hours irrigation. Await further recommendations per urology.   Acute blood loss anemia Related to above. VSS. Will obtain CBC this evening since continues with gross hematuria. Monitor closely  Atrial fibrillation Heart rate is controlled, Holding the Coumadin due to the hematuria. Continue with Coreg as well as Cardizem.  Hyperlipidemia Continue statins  ? UTI Patient's UA shows positive nitrite, WBCs. Cipro  Day #2. Afebrile and non-toxic appearing.   Code Status: full Family Communication: none present Disposition Plan: home hopefully tomorrow   Consultants:  urology  Procedures:  none  Antibiotics:  cipro 01/21/15>>  HPI/Subjective: Awake alert complains of intermittent bladder "spasm"  Objective: Filed Vitals:   01/22/15 1452  BP: 128/73  Pulse: 88  Temp: 98.9 F (37.2 C)  Resp: 20    Intake/Output Summary (Last 24 hours) at 01/22/15 1610 Last data filed at 01/22/15 1453  Gross per 24 hour  Intake    483 ml  Output    200 ml  Net    283 ml   Filed Weights   01/21/15 1711 01/21/15 2201  Weight: 84.369 kg (186 lb) 85.8 kg (189 lb 2.5 oz)    Exam:   General:  Well nourished appears comfortable  Cardiovascular: irregularly irregular  Respiratory: normal effort BS clear   Abdomen: flat soft +BS  Musculoskeletal: no clubbing or cyanosis   Data Reviewed: Basic Metabolic Panel:  Recent Labs Lab 01/21/15 0812 01/21/15 1837 01/22/15 0602  NA 139 138 140  K 3.6 4.2 3.8  CL 105 103 104  CO2 26  --  26  GLUCOSE 131* 108* 118*  BUN 13 18 20   CREATININE 0.91 1.10 1.14  CALCIUM 9.1  --  8.9   Liver Function  Tests:  Recent Labs Lab 01/22/15 0602  AST 16  ALT 13  ALKPHOS 50  BILITOT 1.0  PROT 6.4  ALBUMIN 3.5   No results for input(s): LIPASE, AMYLASE in the last 168 hours. No results for input(s): AMMONIA in the last 168 hours. CBC:  Recent Labs Lab 01/21/15 0812 01/21/15 1837 01/22/15 0602  WBC 12.0*  --  16.9*  NEUTROABS 8.1*  --   --   HGB 14.2 15.0 12.7*  HCT 43.6 44.0 38.3*  MCV 99.5  --  99.0  PLT 211  --  205   Cardiac Enzymes: No results for input(s): CKTOTAL, CKMB, CKMBINDEX, TROPONINI in the last 168 hours. BNP (last 3 results) No results for input(s): BNP in the last 8760 hours.  ProBNP (last 3 results) No results for input(s): PROBNP in the last 8760 hours.  CBG: No results for input(s): GLUCAP in the last 168 hours.  No results found for this or any previous visit (from the past 240 hour(s)).   Studies: No results found.  Scheduled Meds: . carvedilol  12.5 mg Oral BID WC  . ciprofloxacin  500 mg Oral BID  . diltiazem  120 mg Oral Daily  . finasteride  5 mg Oral Daily  . furosemide  20 mg Oral Daily  . rosuvastatin  10 mg Oral q1800  . sodium chloride  3 mL Intravenous Q12H  . tamsulosin  0.4 mg Oral Daily  . Warfarin - Physician Dosing Inpatient   Does  not apply q1800   Continuous Infusions:   Principal Problem:   Urinary retention Active Problems:   Coronary atherosclerosis   FIBRILLATION, ATRIAL   Long term current use of anticoagulant therapy   UTI (lower urinary tract infection)   Hematuria    Time spent: 35 minutes    [facility] . If 7PM-7AM, please contact night-coverage at [email or web address], password [facility] 01/22/2015, 4:10 PM  LOS: 1 day

## 2015-01-22 NOTE — Progress Notes (Signed)
UR chart review completed.  

## 2015-01-22 NOTE — Progress Notes (Signed)
Patient's catheter was irrigated throughout shift with sterile water. Patient's output still appeared red in color, but  lightened up post irrigation. Some small clots were noted during a few irrigations.Patient tolerated irrigations well though he did verbalize some discomfort that resolved post irrigation. Peri care was performed after each irrigation. Patient is in stable condition, with foley bag emptied and in dependent position .

## 2015-01-22 NOTE — Progress Notes (Signed)
  Subjective: Patient reports some bladder spasms. He hasn't passed a significant clot or a while. Catheter is being irrigated appropriately.  Objective: Vital signs in last 24 hours: Temp:  [98.1 F (36.7 C)-98.9 F (37.2 C)] 98.9 F (37.2 C) (04/12 1452) Pulse Rate:  [88-112] 88 (04/12 1452) Resp:  [18-20] 20 (04/12 1452) BP: (124-135)/(73-87) 128/73 mmHg (04/12 1452) SpO2:  [96 %-99 %] 96 % (04/12 1452) Weight:  [85.8 kg (189 lb 2.5 oz)] 85.8 kg (189 lb 2.5 oz) (04/11 2201)  Intake/Output from previous day: 04/11 0701 - 04/12 0700 In: 3 [I.V.:3] Out: 200 [Urine:200] Intake/Output this shift: Total I/O In: 480 [P.O.:480] Out: 300 [Urine:300]  Physical Exam:  Constitutional: Vital signs reviewed. WD WN in NAD   Eyes: PERRL, No scleral icterus.  Catheter present. Pulmonary/Chest: Normal effort Abdominal: Soft. Non-tender, non-distended, bowel sounds are normal, no masses, organomegaly, or guarding present.  Genitourinary: Catheter present, draining clear urine. Extremities: No cyanosis or edema   Lab Results:  Recent Labs  01/21/15 0812 01/21/15 1837 01/22/15 0602  HGB 14.2 15.0 12.7*  HCT 43.6 44.0 38.3*   BMET  Recent Labs  01/21/15 0812 01/21/15 1837 01/22/15 0602  NA 139 138 140  K 3.6 4.2 3.8  CL 105 103 104  CO2 26  --  26  GLUCOSE 131* 108* 118*  BUN 13 18 20   CREATININE 0.91 1.10 1.14  CALCIUM 9.1  --  8.9    Recent Labs  01/21/15 0812 01/22/15 0602  INR 2.46* 2.25*   No results for input(s): LABURIN in the last 72 hours. Results for orders placed or performed during the hospital encounter of 01/21/15  Urine culture     Status: None   Collection Time: 01/21/15  8:55 AM  Result Value Ref Range Status   Specimen Description URINE, CATHETERIZED  Final   Special Requests NONE  Final   Colony Count NO GROWTH Performed at [facility]   Final   Culture NO GROWTH Performed at [facility]   Final   Report Status  01/22/2015 FINAL  Final   I irrigated the patient's bladder with 140 mL of saline, no clots were irrigated. Studies/Results: No results found.  Assessment/Plan:   Gross hematuria with retention. Currently treated with a Foley catheter. The catheter is irrigating clear, no clots were liberated today. He is appropriately on finasteride. I think it fine to give him a voiding trial in the morning, and if he does well with this, he has fine to go home from a urologic standpoint. I would recommend that he follow-up with Dr. Irine Seal in the near future as an outpatient for cystoscopy to rule out a neoplasm in the bladder causing this immaturity. At this point, is most likely secondary to prostatic bleeding.   LOS: 1 day   Jorja Loa 01/22/2015, 5:31 PM

## 2015-01-22 NOTE — Care Management Note (Addendum)
    Page 1 of 1   01/23/2015     11:47:45 AM CARE MANAGEMENT NOTE 01/23/2015  Patient:  [facility]   Account Number:  [id number]  Date Initiated:  01/22/2015  Documentation initiated by:  Theophilus Kinds  Subjective/Objective Assessment:   Pt admitted from home with hematuria and urinary retention. Pt lives with his wife and will return home at discharge. Pt will need to discharge with his foley. Pt is independent with ADL's.     Action/Plan:   Pt would like [place] RN with [facility] at discharge. Will continue to follow for discharge planning needs.   Anticipated DC Date:  01/23/2015   Anticipated DC Plan:  [place]  CM consult      [facility] Choice  HOME HEALTH   Choice offered to / List presented to:  C-1 Patient           Status of service:  Completed, signed off Medicare Important Message given?   (If response is "NO", the following Medicare IM given date fields will be blank) Date Medicare IM given:   Medicare IM given by:   Date Additional Medicare IM given:   Additional Medicare IM given by:    Discharge Disposition:  HOME/SELF CARE  Per UR Regulation:    If discussed at Long Length of Stay Meetings, dates discussed:    Comments:  01/23/15 [place], RN BSN CM Pt discharged home today. Pt will not discharge with foley. No HH needs since foley is out. Pt and pts nurse aware of discharge arrangements.  01/22/15 [place], RN BSN CM Pt changed to OBS status. CC 44 given.  01/22/15 [place], RN BSN CM

## 2015-01-23 LAB — CBC
HCT: 38.2 % — ABNORMAL LOW (ref 39.0–52.0)
HEMOGLOBIN: 12.5 g/dL — AB (ref 13.0–17.0)
MCH: 32.8 pg (ref 26.0–34.0)
MCHC: 32.7 g/dL (ref 30.0–36.0)
MCV: 100.3 fL — AB (ref 78.0–100.0)
PLATELETS: 180 10*3/uL (ref 150–400)
RBC: 3.81 MIL/uL — AB (ref 4.22–5.81)
RDW: 12.9 % (ref 11.5–15.5)
WBC: 12.9 10*3/uL — AB (ref 4.0–10.5)

## 2015-01-23 LAB — PROTIME-INR
INR: 2.13 — AB (ref 0.00–1.49)
PROTHROMBIN TIME: 24 s — AB (ref 11.6–15.2)

## 2015-01-23 MED ORDER — FINASTERIDE 5 MG PO TABS: 5.0000 mg | ORAL_TABLET | Freq: Every day | ORAL | Status: DC

## 2015-01-23 NOTE — Discharge Summary (Signed)
Physician Discharge Summary  [name] [id number]:[id number] DOB: [birth year] DOA: 01/21/2015  PCP: [name], MD  Admit date: 01/21/2015 Discharge date: 01/23/2015  Time spent: 40 minutes  Recommendations for Outpatient Follow-up:  1. Dr [name] office will call to schedule appointment for evaluation of hematuria, urinary retention and [name] and cystoscopy to rule out neoplasm in bladder   Discharge Diagnoses:  Principal Problem:   Urinary retention Active Problems:   Coronary atherosclerosis   FIBRILLATION, ATRIAL   Long term current use of anticoagulant therapy   UTI (lower urinary tract infection)   Hematuria   Acute blood loss anemia   Discharge Condition: stable  Diet recommendation: heart healthy  Filed Weights   01/21/15 1711 01/21/15 2201  Weight: 84.369 kg (186 lb) 85.8 kg (189 lb 2.5 oz)    History of present illness:  81 year old male who  has a past medical history of Hypertension; Persistent atrial fibrillation; Coronary artery disease; Ejection fraction < 50%; Anemia; Cellulitis; DJD (degenerative joint disease); GERD (gastroesophageal reflux disease); PUD (peptic ulcer disease); Skin cancer of eyelid; Atrial fibrillation; and Prostate hypertrophy presented to the hospital on 01/21/15 with chief complaint of inability to urinate. Patient was seen in the ED prior to this visit, and was diagnosed with urinary retention. Foley catheter was inserted but patient refused to go home with Foley catheter. Patient refused to go home with foley, so foley catheter was removed and patient was voiding and went home. Patient said that he was again unable to urinate so came back to the ED. Foley catheter was again inserted and patient arrived at [place] hematuria.urology was consulted by the ED physician, and he was admitted for observation and irrigation of foley. Patient denied dysuria, no chest pain no shortness of breath. No nausea vomiting or diarrhea. Complained of bladder spasms  earlier. He has a history of [name]  Hospital Course:  Urinary retention Foley catheter inserted via urology. Finasteride added per urology. Hematuria gradually resolved. Few clots irrigated per nursing staff. Foley catheter discontinued night of 01/22/15 and at discharge patient voiding pink urine without problem. Dr wrenn's office will contact patient to schedule follow up appointment.  Acute blood loss anemia Related to above. Mild. Hg stable at 12.5 at discharge. INR 2.13 at discharge. Will continue to follow up with coumadin clinic.   Atrial fibrillation Heart rate remained controlled. Coumadin held. Will resume at discharge.  due to the hematuria. Continue with Coreg as well as Cardizem.  Hyperlipidemia Continue statins  ? UTI Patient's UA shows positive nitrite, WBCs. Received 3 days cipro.  Afebrile and non-toxic appearing at discharge.   Procedures:  Foley insertion  Consultations:  urology  Discharge Exam: Filed Vitals:   01/23/15 0614  BP: 128/66  Pulse: 69  Temp: 98.4 F (36.9 C)  Resp: 20    General: well nourished appears comfortable Cardiovascular: irregularly irregular Respiratory: normal effort BS clear bilaterally no wheeze  Discharge Instructions    Current Discharge Medication List    START taking these medications   Details  finasteride (PROSCAR) 5 MG tablet Take 1 tablet (5 mg total) by mouth daily. Qty: 30 tablet, Refills: 0      CONTINUE these medications which have NOT CHANGED   Details  acetaminophen (TYLENOL) 650 MG CR tablet Take 650 mg by mouth every 8 (eight) hours as needed for pain.     carvedilol (COREG) 12.5 MG tablet TAKE ONE TABLET BY MOUTH TWICE DAILY Qty: 60 tablet, Refills: 2    Cholecalciferol (VITAMIN D-3  PO) Take 1 tablet by mouth daily.   Associated Diagnoses: Leucocytosis    ciprofloxacin (CIPRO) 500 MG tablet Take 1 tablet (500 mg total) by mouth every 12 (twelve) hours. Qty: 14 tablet, Refills: 0    diclofenac  sodium (VOLTAREN) 1 % GEL Apply 1 gram to hands or other affected joints up to 4 times daily Qty: 100 g, Refills: 2    diltiazem (CARDIZEM CD) 120 MG 24 hr capsule TAKE ONE (1) CAPSULE EACH DAY Qty: 30 capsule, Refills: 2    furosemide (LASIX) 20 MG tablet Take 1 tablet (20 mg total) by mouth daily. Qty: 30 tablet, Refills: 2    lovastatin (MEVACOR) 20 MG tablet Take 20 mg by mouth at bedtime.    tamsulosin (FLOMAX) 0.4 MG CAPS capsule Take 1 capsule (0.4 mg total) by mouth daily. Qty: 10 capsule, Refills: 0    warfarin (COUMADIN) 5 MG tablet TAKE ONE (1) TABLET EACH DAY Qty: 90 tablet, Refills: 0    rosuvastatin (CRESTOR) 10 MG tablet Take 1 tablet (10 mg total) by mouth daily. Take 1 tablet on Mondays and Fridays Qty: 28 tablet, Refills: 1      STOP taking these medications     fluticasone (FLONASE) 50 MCG/ACT nasal spray        Allergies  Allergen Reactions  . Penicillins Hives and Rash  . Hct [Hydrochlorothiazide] Other (See Comments)    hyper  . Liptruzet [Ezetimibe-Atorvastatin] Other (See Comments)    myalgia  . Statins Other (See Comments)    myalgia   Follow-up Information    Follow up with Malka So, MD.   Specialty:  Urology   Contact information:   [address] [place] [address] [phone]        The results of significant diagnostics from this hospitalization (including imaging, microbiology, ancillary and laboratory) are listed below for reference.    Significant Diagnostic Studies: No results found.  Microbiology: Recent Results (from the past 240 hour(s))  Urine culture     Status: None   Collection Time: 01/21/15  8:55 AM  Result Value Ref Range Status   Specimen Description URINE, CATHETERIZED  Final   Special Requests NONE  Final   Colony Count NO GROWTH Performed at [facility]   Final   Culture NO GROWTH Performed at [facility]   Final   Report Status 01/22/2015 FINAL  Final     Labs: Basic  Metabolic Panel:  Recent Labs Lab 01/21/15 0812 01/21/15 1837 01/22/15 0602  NA 139 138 140  K 3.6 4.2 3.8  CL 105 103 104  CO2 26  --  26  GLUCOSE 131* 108* 118*  BUN 13 18 20   CREATININE 0.91 1.10 1.14  CALCIUM 9.1  --  8.9   Liver Function Tests:  Recent Labs Lab 01/22/15 0602  AST 16  ALT 13  ALKPHOS 50  BILITOT 1.0  PROT 6.4  ALBUMIN 3.5   No results for input(s): LIPASE, AMYLASE in the last 168 hours. No results for input(s): AMMONIA in the last 168 hours. CBC:  Recent Labs Lab 01/21/15 0812 01/21/15 1837 01/22/15 0602 01/22/15 1759 01/23/15 0645  WBC 12.0*  --  16.9* 16.5* 12.9*  NEUTROABS 8.1*  --   --   --   --   HGB 14.2 15.0 12.7* 12.4* 12.5*  HCT 43.6 44.0 38.3* 37.7* 38.2*  MCV 99.5  --  99.0 100.0 100.3*  PLT 211  --  205 193  180   Cardiac Enzymes: No results for input(s): CKTOTAL, CKMB, CKMBINDEX, TROPONINI in the last 168 hours. BNP: BNP (last 3 results) No results for input(s): BNP in the last 8760 hours.  ProBNP (last 3 results) No results for input(s): PROBNP in the last 8760 hours.  CBG: No results for input(s): GLUCAP in the last 168 hours.     SignedRadene Gunning  [facility] 01/23/2015, 10:21 AM

## 2015-01-23 NOTE — Progress Notes (Signed)
1152 Patient given d/c instructions, paperwork & hard Rx. Patient aware that Dr.[name] (urology) will be calling to schedule a f/u apt. IV catheter removed from LEFT AC, catheter tip intact, no s/s of infection noted. Patient instructed to measure urine output, color and appearance using a log. Patient's wife present for d/c instructions. Patient assisted to vehicle via w/c by staff.

## 2015-01-25 ENCOUNTER — Ambulatory Visit (INDEPENDENT_AMBULATORY_CARE_PROVIDER_SITE_OTHER): Payer: Medicare Other | Admitting: Urology

## 2015-01-25 DIAGNOSIS — R310 Gross hematuria: Secondary | ICD-10-CM

## 2015-01-25 DIAGNOSIS — R339 Retention of urine, unspecified: Secondary | ICD-10-CM

## 2015-01-25 DIAGNOSIS — N401 Enlarged prostate with lower urinary tract symptoms: Secondary | ICD-10-CM | POA: Diagnosis not present

## 2015-02-05 ENCOUNTER — Ambulatory Visit (INDEPENDENT_AMBULATORY_CARE_PROVIDER_SITE_OTHER): Payer: Medicare Other | Admitting: Pharmacist Clinician (PhC)/ Clinical Pharmacy Specialist

## 2015-02-05 DIAGNOSIS — I4891 Unspecified atrial fibrillation: Secondary | ICD-10-CM

## 2015-02-05 DIAGNOSIS — Z7901 Long term (current) use of anticoagulants: Secondary | ICD-10-CM

## 2015-02-05 DIAGNOSIS — I482 Chronic atrial fibrillation, unspecified: Secondary | ICD-10-CM

## 2015-02-05 LAB — POCT INR: INR: 1.5

## 2015-02-05 MED ORDER — DILTIAZEM HCL 30 MG PO TABS: 30.0000 mg | ORAL_TABLET | Freq: Four times a day (QID) | ORAL | Status: DC

## 2015-02-05 NOTE — Patient Instructions (Signed)
Anticoagulation Dose Instructions as of 02/05/2015      [name] Tue Wed Thu Fri Sat   New Dose 5 mg 2.5 mg 5 mg 5 mg 5 mg 5 mg 5 mg    Description        Take 1 1/2 tablets today and tomorrow.  Then resume regular schedule.

## 2015-02-06 ENCOUNTER — Ambulatory Visit: Payer: Medicare Other | Admitting: Family Medicine

## 2015-02-06 ENCOUNTER — Telehealth: Payer: Self-pay | Admitting: Family Medicine

## 2015-02-06 NOTE — Telephone Encounter (Signed)
Patient given Dr. Bronson Ing number and they are going to try and see him today.

## 2015-02-07 ENCOUNTER — Other Ambulatory Visit: Payer: Self-pay | Admitting: Family Medicine

## 2015-02-13 ENCOUNTER — Telehealth: Payer: Self-pay | Admitting: Family Medicine

## 2015-02-13 NOTE — Telephone Encounter (Signed)
Pt given appt tomorrow with Dr.Moore.

## 2015-02-14 ENCOUNTER — Encounter: Payer: Self-pay | Admitting: Family Medicine

## 2015-02-14 ENCOUNTER — Ambulatory Visit (INDEPENDENT_AMBULATORY_CARE_PROVIDER_SITE_OTHER): Payer: Medicare Other | Admitting: Family Medicine

## 2015-02-14 VITALS — BP 128/82 | HR 60 | Temp 97.3°F | Ht 67.0 in | Wt 193.0 lb

## 2015-02-14 DIAGNOSIS — K5900 Constipation, unspecified: Secondary | ICD-10-CM | POA: Diagnosis not present

## 2015-02-14 DIAGNOSIS — K6289 Other specified diseases of anus and rectum: Secondary | ICD-10-CM | POA: Diagnosis not present

## 2015-02-14 DIAGNOSIS — N400 Enlarged prostate without lower urinary tract symptoms: Secondary | ICD-10-CM

## 2015-02-14 LAB — POCT CBC
Granulocyte percent: 53.5 %G (ref 37–80)
HCT, POC: 44.3 % (ref 43.5–53.7)
Hemoglobin: 13.8 g/dL — AB (ref 14.1–18.1)
LYMPH, POC: 5.4 — AB (ref 0.6–3.4)
MCH, POC: 30.6 pg (ref 27–31.2)
MCHC: 31.2 g/dL — AB (ref 31.8–35.4)
MCV: 98.1 fL — AB (ref 80–97)
MPV: 8.3 fL (ref 0–99.8)
POC Granulocyte: 4.52 (ref 2–6.9)
POC LYMPH PERCENT: 40.4 %L (ref 10–50)
Platelet Count, POC: 308 10*3/uL (ref 142–424)
RBC: 4.52 M/uL — AB (ref 4.69–6.13)
RDW, POC: 13.8 %
WBC: 13.4 10*3/uL — AB (ref 4.6–10.2)

## 2015-02-14 LAB — POCT UA - MICROSCOPIC ONLY
Bacteria, U Microscopic: NEGATIVE
CASTS, UR, LPF, POC: NEGATIVE
CRYSTALS, UR, HPF, POC: NEGATIVE
Mucus, UA: NEGATIVE
WBC, UR, HPF, POC: NEGATIVE
Yeast, UA: NEGATIVE

## 2015-02-14 LAB — POCT URINALYSIS DIPSTICK
Bilirubin, UA: NEGATIVE
Glucose, UA: NEGATIVE
Ketones, UA: NEGATIVE
Leukocytes, UA: NEGATIVE
Nitrite, UA: NEGATIVE
Protein, UA: NEGATIVE
SPEC GRAV UA: 1.015
UROBILINOGEN UA: NEGATIVE
pH, UA: 6.5

## 2015-02-14 NOTE — Addendum Note (Signed)
Addended by: Ilean China on: 02/14/2015 12:38 PM   Modules accepted: Orders

## 2015-02-14 NOTE — Progress Notes (Signed)
   Subjective:    Patient ID: [name], male    DOB: 1932/08/19, 81 y.o.   MRN: [id number]  HPI  81 year old male comes in today accompanied by his wife. He complains of rectal pain mainly when sitting and rectal pressure when urinating. He was recently hospitalized and has a follow up appointment with Dr Jeffie Pollock in June. He was told that he had a blood vessel that busted in his prostate. He was treated with antibiotics. The patient has seen Dr. Laural Golden in the past for his GI problems. He denies any blood in the stool or any that he can see in the urine. His bowel movements are somewhat constipated.   Review of Systems  HENT: Negative.   Eyes: Negative.   Respiratory: Negative.   Cardiovascular: Negative.   Gastrointestinal: Positive for rectal pain.  Endocrine: Negative.   Genitourinary: Positive for hematuria and difficulty urinating.  Musculoskeletal: Negative.   Skin: Negative.   Allergic/Immunologic: Negative.   Neurological: Positive for weakness.  Hematological: Negative.   Psychiatric/Behavioral: Negative.        Objective:   Physical Exam  Constitutional: He is oriented to person, place, and time. He appears well-developed and well-nourished. No distress.  HENT:  Head: Normocephalic.  Eyes: Conjunctivae and EOM are normal. Pupils are equal, round, and reactive to light. Right eye exhibits no discharge. Left eye exhibits no discharge. No scleral icterus.  Genitourinary: Rectum normal and penis normal. No penile tenderness.  The prostate is extremely enlarged and very firm on the right side. There were no rectal masses. There were no inguinal hernias and the external genitalia appeared normal.  Musculoskeletal: Normal range of motion. He exhibits no edema.  Neurological: He is alert and oriented to person, place, and time.  Skin: Skin is warm and dry. No rash noted.  Psychiatric: He has a normal mood and affect. His behavior is normal. Thought content normal.  Vitals  reviewed.   BP 128/82 mmHg  Pulse 60  Temp(Src) 97.3 F (36.3 C) (Oral)  Ht '5\' 7"'$  (1.702 m)  Wt 193 lb (87.544 kg)  BMI 30.22 kg/m2      Assessment & Plan:  1. Rectal pain -On exam there were no obvious findings other than a very enlarged prostate gland - Ambulatory referral to Gastroenterology - CT Pelvis W Contrast; Future  2. [name] (benign prostatic hyperplasia) -Enlarged prostate gland right firmer than the left side.  3. Constipation, unspecified constipation type -We will have the patient use MiraLAX and drink more fluids  Patient Instructions  The patient should continue to drink plenty of fluids He should use MiraLAX if needed for constipation We will arrange for him to see the gastroenterologist for a possible sigmoidoscopy to evaluate the rectal pain further We will also arrange for him to have a CT scan of the pelvis because of the rectal pain We will ask him to follow-up with the urologist as planned Return the FOBT We will call him with the results of the CBC is as they become available   Arrie Senate MD

## 2015-02-14 NOTE — Patient Instructions (Addendum)
The patient should continue to drink plenty of fluids He should use MiraLAX if needed for constipation We will arrange for him to see the gastroenterologist for a possible sigmoidoscopy to evaluate the rectal pain further We will also arrange for him to have a CT scan of the pelvis because of the rectal pain We will ask him to follow-up with the urologist as planned Return the FOBT We will call him with the results of the CBC is as they become available

## 2015-02-19 ENCOUNTER — Ambulatory Visit (INDEPENDENT_AMBULATORY_CARE_PROVIDER_SITE_OTHER): Payer: Medicare Other | Admitting: Pharmacist Clinician (PhC)/ Clinical Pharmacy Specialist

## 2015-02-19 DIAGNOSIS — Z7901 Long term (current) use of anticoagulants: Secondary | ICD-10-CM | POA: Diagnosis not present

## 2015-02-19 DIAGNOSIS — I482 Chronic atrial fibrillation, unspecified: Secondary | ICD-10-CM

## 2015-02-19 DIAGNOSIS — Z1212 Encounter for screening for malignant neoplasm of rectum: Secondary | ICD-10-CM

## 2015-02-19 LAB — POCT INR: INR: 3

## 2015-02-19 NOTE — Addendum Note (Signed)
Addended by: Earlene Plater on: 02/19/2015 09:47 AM   Modules accepted: Orders

## 2015-02-20 ENCOUNTER — Encounter: Payer: Self-pay | Admitting: Cardiology

## 2015-02-20 ENCOUNTER — Ambulatory Visit (INDEPENDENT_AMBULATORY_CARE_PROVIDER_SITE_OTHER): Payer: Medicare Other | Admitting: Cardiology

## 2015-02-20 VITALS — BP 128/80 | HR 68 | Ht 67.0 in | Wt 197.0 lb

## 2015-02-20 DIAGNOSIS — I10 Essential (primary) hypertension: Secondary | ICD-10-CM

## 2015-02-20 DIAGNOSIS — I482 Chronic atrial fibrillation, unspecified: Secondary | ICD-10-CM

## 2015-02-20 MED ORDER — CARVEDILOL 12.5 MG PO TABS: 6.2500 mg | ORAL_TABLET | Freq: Two times a day (BID) | ORAL | Status: DC

## 2015-02-20 NOTE — Progress Notes (Signed)
HPI The patient presents for followup of his coronary disease and atrial fibrillation. Since I saw him he was hospitalized with urinary retention and hematuria. He Rarely had his warfarin held. He is back on warfarin and has had no further hematuria. He has had some fatigue. He was told to take his carvedilol only at night and this seemed to have helped. He is less fatigued. He is limited by hip pains. However, he still able to walk to his mailbox and walk at home. With this he denies any cardiovascular symptoms. The patient denies any new symptoms such as chest discomfort, neck or arm discomfort. There has been no new shortness of breath, PND or orthopnea. There have been no reported palpitations, presyncope or syncope.  Allergies  Allergen Reactions  . Penicillins Hives and Rash  . Hct [Hydrochlorothiazide] Other (See Comments)    hyper  . Liptruzet [Ezetimibe-Atorvastatin] Other (See Comments)    myalgia  . Statins Other (See Comments)    myalgia    Current Outpatient Prescriptions  Medication Sig Dispense Refill  . acetaminophen (TYLENOL) 650 MG CR tablet Take 650 mg by mouth every 8 (eight) hours as needed for pain.     . carvedilol (COREG) 12.5 MG tablet TAKE ONE TABLET BY MOUTH TWICE DAILY 60 tablet 2  . diclofenac sodium (VOLTAREN) 1 % GEL Apply 1 gram to hands or other affected joints up to 4 times daily 100 g 2  . diltiazem (CARDIZEM CD) 120 MG 24 hr capsule Take 120 mg by mouth daily.     . finasteride (PROSCAR) 5 MG tablet Take 1 tablet (5 mg total) by mouth daily. 30 tablet 0  . furosemide (LASIX) 20 MG tablet TAKE ONE (1) TABLET EACH DAY 30 tablet 1  . rosuvastatin (CRESTOR) 10 MG tablet Take 1 tablet (10 mg total) by mouth daily. Take 1 tablet on Mondays and Fridays 28 tablet 1  . tamsulosin (FLOMAX) 0.4 MG CAPS capsule Take 1 capsule (0.4 mg total) by mouth daily. 10 capsule 0  . warfarin (COUMADIN) 5 MG tablet TAKE ONE (1) TABLET EACH DAY 90 tablet 0   No current  facility-administered medications for this visit.    Past Medical History  Diagnosis Date  . Hypertension   . Persistent atrial fibrillation   . Coronary artery disease     5 bypasses  . Ejection fraction < 50%     Mildly reduced, 40% by echo  . Anemia   . Cellulitis     In past  . DJD (degenerative joint disease)   . GERD (gastroesophageal reflux disease)   . PUD (peptic ulcer disease)   . Skin cancer of eyelid     Resected  . Atrial fibrillation   . Prostate hypertrophy     on CT scan 09/2014    Past Surgical History  Procedure Laterality Date  . Coronary artery bypass graft  4/05    LIMA to LAD, SVG to PDA, SVG to ramus intermediate  . Heart bypass  2005  . Total knee arthroplasty      Right  . Appendectomy    . Eyelid carcinoma excision      Skin cancer resected  . Total hip arthroplasty      Right  . Bypass graft  2005     ROS:  Constipation.  Otherwise as stated in the HPI and negative for all other systems.  PHYSICAL EXAM BP 128/80 mmHg  Pulse 68  Ht '5\' 7"'$  (1.702 m)  Wt 197 lb (89.359 kg)  BMI 30.85 kg/m2 GENERAL:  Well appearing NECK:  No jugular venous distention, waveform within normal limits, carotid upstroke brisk and symmetric, no bruits, no thyromegaly, irregular LUNGS:  Clear to auscultation bilaterally BACK:  No CVA tenderness CHEST:  Well healed sternotomy scar. HEART:  PMI not displaced or sustained,S1 and S2 within normal limits, no S3, no clicks, no rubs, no murmurs, irregular ABD:  Flat, positive bowel sounds normal in frequency in pitch, no bruits, no rebound, no guarding, no midline pulsatile mass, no hepatomegaly, no splenomegaly, obese EXT:  2 plus pulses throughout, no edema, no cyanosis no clubbing  EKG:  Atrial fibrillation, rate this 68, early transition in lead V2, nonspecific lateral ST flattening.    02/20/2015  ASSESSMENT AND PLAN  [name]:  The patient has no new sypmtoms.  I would like for him to take his carvedilol 6.25 twice  a day instead of 12.5 mg once daily. However, if this causes excessive fatigue he will let me know and we will make an change.  HTN:  The blood pressure is at target. No change in medications is indicated. We will continue with therapeutic lifestyle changes (TLC).  HYPERLIPIDEMIA:  This is followed closely by Dr. Laurance Flatten.  The patient he is intolerant of statins.  ATRIAL FIBRILLATION:  The patient  tolerates this rhythm and rate control and anticoagulation. We will continue with the meds as listed. Is not interested in switching to another agent.

## 2015-02-20 NOTE — Patient Instructions (Signed)
Please take Carvedilol 12.5 mg 1/2 tablet twice a day. Continue all other medications as listed.  Follow up in 1 year with Dr. Percival Spanish in [place].  You will receive a letter in the mail 2 months before you are due.  Please call us when you receive this letter to schedule your follow up appointment.  Thank you for choosing [facility]!!

## 2015-02-21 LAB — FECAL OCCULT BLOOD, IMMUNOCHEMICAL: FECAL OCCULT BLD: NEGATIVE

## 2015-02-28 ENCOUNTER — Ambulatory Visit (HOSPITAL_COMMUNITY)
Admission: RE | Admit: 2015-02-28 | Discharge: 2015-02-28 | Disposition: A | Discharge status: Home / Self Care | Payer: Medicare Other | Source: Ambulatory Visit | Attending: Family Medicine | Admitting: Family Medicine

## 2015-02-28 DIAGNOSIS — K5730 Diverticulosis of large intestine without perforation or abscess without bleeding: Secondary | ICD-10-CM | POA: Insufficient documentation

## 2015-02-28 DIAGNOSIS — R868 Other abnormal findings in specimens from male genital organs: Secondary | ICD-10-CM | POA: Insufficient documentation

## 2015-02-28 DIAGNOSIS — R361 Hematospermia: Secondary | ICD-10-CM | POA: Diagnosis not present

## 2015-02-28 DIAGNOSIS — N400 Enlarged prostate without lower urinary tract symptoms: Secondary | ICD-10-CM | POA: Insufficient documentation

## 2015-02-28 DIAGNOSIS — K6289 Other specified diseases of anus and rectum: Secondary | ICD-10-CM

## 2015-02-28 DIAGNOSIS — R319 Hematuria, unspecified: Secondary | ICD-10-CM | POA: Insufficient documentation

## 2015-02-28 MED ORDER — IOHEXOL 300 MG/ML  SOLN
100.0000 mL | Freq: Once | INTRAMUSCULAR | Status: AC | PRN
  Administered 2015-02-28: 100 mL via INTRAVENOUS

## 2015-03-06 ENCOUNTER — Encounter (INDEPENDENT_AMBULATORY_CARE_PROVIDER_SITE_OTHER): Payer: Self-pay | Admitting: *Deleted

## 2015-03-19 ENCOUNTER — Ambulatory Visit (INDEPENDENT_AMBULATORY_CARE_PROVIDER_SITE_OTHER): Payer: Medicare Other | Admitting: Pharmacist Clinician (PhC)/ Clinical Pharmacy Specialist

## 2015-03-19 DIAGNOSIS — I482 Chronic atrial fibrillation, unspecified: Secondary | ICD-10-CM

## 2015-03-19 DIAGNOSIS — Z7901 Long term (current) use of anticoagulants: Secondary | ICD-10-CM

## 2015-03-19 LAB — POCT INR: INR: 2.8

## 2015-03-22 ENCOUNTER — Ambulatory Visit (INDEPENDENT_AMBULATORY_CARE_PROVIDER_SITE_OTHER): Payer: Medicare Other | Admitting: Urology

## 2015-03-22 DIAGNOSIS — R972 Elevated prostate specific antigen [PSA]: Secondary | ICD-10-CM | POA: Diagnosis not present

## 2015-03-22 DIAGNOSIS — R310 Gross hematuria: Secondary | ICD-10-CM

## 2015-03-22 DIAGNOSIS — R339 Retention of urine, unspecified: Secondary | ICD-10-CM | POA: Diagnosis not present

## 2015-03-22 DIAGNOSIS — N401 Enlarged prostate with lower urinary tract symptoms: Secondary | ICD-10-CM | POA: Diagnosis not present

## 2015-03-26 ENCOUNTER — Other Ambulatory Visit: Payer: Self-pay | Admitting: Family Medicine

## 2015-04-03 ENCOUNTER — Encounter (INDEPENDENT_AMBULATORY_CARE_PROVIDER_SITE_OTHER): Payer: Self-pay | Admitting: Internal Medicine

## 2015-04-03 ENCOUNTER — Ambulatory Visit (INDEPENDENT_AMBULATORY_CARE_PROVIDER_SITE_OTHER): Payer: Medicare Other | Admitting: Internal Medicine

## 2015-04-03 ENCOUNTER — Other Ambulatory Visit (INDEPENDENT_AMBULATORY_CARE_PROVIDER_SITE_OTHER): Payer: Self-pay | Admitting: *Deleted

## 2015-04-03 ENCOUNTER — Telehealth: Payer: Self-pay | Admitting: *Deleted

## 2015-04-03 ENCOUNTER — Telehealth (INDEPENDENT_AMBULATORY_CARE_PROVIDER_SITE_OTHER): Payer: Self-pay | Admitting: *Deleted

## 2015-04-03 VITALS — BP 110/74 | HR 60 | Temp 97.8°F | Ht 67.0 in | Wt 191.9 lb

## 2015-04-03 DIAGNOSIS — Z86010 Personal history of colonic polyps: Secondary | ICD-10-CM

## 2015-04-03 NOTE — Patient Instructions (Signed)
The risks and benefits such as perforation, bleeding, and infection were reviewed with the patient and is agreeable. 

## 2015-04-03 NOTE — Telephone Encounter (Signed)
This patient may need bridging with Lovenox--- please discuss with patient and recommend treatment

## 2015-04-03 NOTE — Progress Notes (Addendum)
Subjective:    Patient ID: [name], male    DOB: 1932-10-02, 81 y.o.   MRN: [id number]  HPI Presents today with c/o that his prostate is enlarged. Two months ago he could not void.  He says a blood vessel ruptured. He was admitted to [facility] x 2 days with hematuria. He says he has been cleared by urology. Will be seen in 6 months by Dr. Jeffie Pollock. He was seen by Dr. Laurance Flatten and was advised to follow up with our office.  He denies any rectal pain. He denies any rectal bleeding. Stool was guaiac negative at Dr. Tawanna Sat office. His last colonoscopy was about 10 yrs ago in [place] and he tells me he had polyps.  He says he does not have any GI problems.  Appetite is good. No weight loss. Usually has a BM daily. Occasionally takes a stool softener.  Denies voiding problems Hx significant for atrial fib/ CABG and maintained on Warfarin Last colonoscopy/EGD was in 2005: by Dr. Laural Golden FINAL DIAGNOSES: 1. Small-to-moderate size sliding hiatal hernia with mild changes of reflux  esophagitis limited to gastroesophageal junction. This may or may not  have anything to do with his noncardiac chest pain. 2. Erosive antral gastritis. 3. Ulcerative colitis (UC) remains in remission. 4. Sigmoid colon diverticulosis. Random biopsies taken from various  segments of colon looking for dysplasia. 5. Small external hemorrhoids. Review of Systems Past Medical History  Diagnosis Date  . Hypertension   . Persistent atrial fibrillation   . Coronary artery disease     5 bypasses  . Ejection fraction < 50%     Mildly reduced, 40% by echo  . Anemia   . Cellulitis     In past  . DJD (degenerative joint disease)   . GERD (gastroesophageal reflux disease)   . PUD (peptic ulcer disease)   . Skin cancer of eyelid     Resected  . Atrial fibrillation   . Prostate hypertrophy     on CT scan 09/2014    Past Surgical History  Procedure Laterality Date  . Coronary artery bypass graft  4/05    LIMA to  LAD, SVG to PDA, SVG to ramus intermediate  . Heart bypass  2005  . Total knee arthroplasty      Right  . Appendectomy    . Eyelid carcinoma excision      Skin cancer resected  . Total hip arthroplasty      Right  . Bypass graft  2005    Allergies  Allergen Reactions  . Penicillins Hives and Rash  . Hct [Hydrochlorothiazide] Other (See Comments)    hyper  . Liptruzet [Ezetimibe-Atorvastatin] Other (See Comments)    myalgia  . Statins Other (See Comments)    myalgia    Current Outpatient Prescriptions on File Prior to Visit  Medication Sig Dispense Refill  . acetaminophen (TYLENOL) 650 MG CR tablet Take 650 mg by mouth every 8 (eight) hours as needed for pain.     . carvedilol (COREG) 12.5 MG tablet Take 0.5 tablets (6.25 mg total) by mouth 2 (two) times daily. 60 tablet 2  . diclofenac sodium (VOLTAREN) 1 % GEL Apply 1 gram to hands or other affected joints up to 4 times daily 100 g 2  . diltiazem (CARDIZEM CD) 120 MG 24 hr capsule Take 120 mg by mouth daily.     . finasteride (PROSCAR) 5 MG tablet Take 1 tablet (5 mg total) by mouth daily. 30 tablet 0  .  furosemide (LASIX) 20 MG tablet TAKE ONE (1) TABLET EACH DAY 30 tablet 1  . warfarin (COUMADIN) 5 MG tablet TAKE ONE (1) TABLET EACH DAY 90 tablet 0   No current facility-administered medications on file prior to visit.        Objective:   Physical Exam Blood pressure 110/74, pulse 60, temperature 97.8 F (36.6 C), height '5\' 7"'$  (1.702 m), weight 191 lb 14.4 oz (87.045 kg). Alert and oriented. Skin warm and dry. Oral mucosa is moist.   . Sclera anicteric, conjunctivae is pink. Thyroid not enlarged. No cervical lymphadenopathy. Lungs clear. Heart regular rate and rhythm.  Abdomen is soft. Bowel sounds are positive. No hepatomegaly. No abdominal masses felt. No tenderness.  No edema to lower extremities.          Assessment & Plan:  Hx colon poly. Will schedule a colonoscopy. The risks and benefits such as perforation,  bleeding, and infection were reviewed with the patient and is agreeable.

## 2015-04-03 NOTE — Telephone Encounter (Signed)
Patient needs trilyte 

## 2015-04-03 NOTE — Telephone Encounter (Signed)
Patient has a colonoscopy scheduled for 04-22-15 and per their office he needs to stop coumadin 5 days before. Is this ok? Please advise and route to Pool A. Please contact Lacretia Nicks at their office through [facility] or by phone at [phone].

## 2015-04-08 MED ORDER — PEG 3350-KCL-NA BICARB-NACL 420 G PO SOLR: 4000.0000 mL | Freq: Once | ORAL | Status: DC

## 2015-04-08 NOTE — Telephone Encounter (Signed)
Ok to stop warfarin 5 days prior to colonoscopy . Patient's CHADS2 score is 2.  Ann and patient notified.

## 2015-04-09 ENCOUNTER — Encounter: Payer: Self-pay | Admitting: Family Medicine

## 2015-04-09 ENCOUNTER — Telehealth (INDEPENDENT_AMBULATORY_CARE_PROVIDER_SITE_OTHER): Payer: Self-pay | Admitting: *Deleted

## 2015-04-09 ENCOUNTER — Ambulatory Visit (INDEPENDENT_AMBULATORY_CARE_PROVIDER_SITE_OTHER): Payer: Medicare Other | Admitting: Family Medicine

## 2015-04-09 VITALS — BP 139/84 | HR 71 | Temp 97.5°F | Ht 67.0 in | Wt 192.0 lb

## 2015-04-09 DIAGNOSIS — E785 Hyperlipidemia, unspecified: Secondary | ICD-10-CM

## 2015-04-09 DIAGNOSIS — E559 Vitamin D deficiency, unspecified: Secondary | ICD-10-CM | POA: Diagnosis not present

## 2015-04-09 DIAGNOSIS — N400 Enlarged prostate without lower urinary tract symptoms: Secondary | ICD-10-CM | POA: Diagnosis not present

## 2015-04-09 DIAGNOSIS — K219 Gastro-esophageal reflux disease without esophagitis: Secondary | ICD-10-CM

## 2015-04-09 DIAGNOSIS — I10 Essential (primary) hypertension: Secondary | ICD-10-CM | POA: Diagnosis not present

## 2015-04-09 DIAGNOSIS — I482 Chronic atrial fibrillation, unspecified: Secondary | ICD-10-CM

## 2015-04-09 LAB — POCT CBC
GRANULOCYTE PERCENT: 51.6 % (ref 37–80)
HCT, POC: 42.8 % — AB (ref 43.5–53.7)
Hemoglobin: 14.6 g/dL (ref 14.1–18.1)
Lymph, poc: 5.4 — AB (ref 0.6–3.4)
MCH, POC: 32.9 pg — AB (ref 27–31.2)
MCHC: 34.1 g/dL (ref 31.8–35.4)
MCV: 96.5 fL (ref 80–97)
MPV: 8.5 fL (ref 0–99.8)
POC Granulocyte: 6.6 (ref 2–6.9)
POC LYMPH PERCENT: 42.6 %L (ref 10–50)
Platelet Count, POC: 193 10*3/uL (ref 142–424)
RBC: 4.43 M/uL — AB (ref 4.69–6.13)
RDW, POC: 14.3 %
WBC: 12.7 10*3/uL — AB (ref 4.6–10.2)

## 2015-04-09 LAB — LIPID PANEL
HDL: 52 mg/dL (ref 35–70)
LDL Cholesterol: 209 mg/dL

## 2015-04-09 NOTE — Telephone Encounter (Signed)
Patient's wife called to let us know patient saw Dr Laurance Flatten today for f/u and her feels patient needs to cancel TCS for now since he hasn't had any recent issues -- TCS has been canceled

## 2015-04-09 NOTE — Patient Instructions (Addendum)
Medicare Annual Wellness Visit  [facility] and the medical providers at [facility] strive to bring you the best medical care.  In doing so we not only want to address your current medical conditions and concerns but also to detect new conditions early and prevent illness, disease and health-related problems.    Medicare offers a yearly Wellness Visit which allows our clinical staff to assess your need for preventative services including immunizations, lifestyle education, counseling to decrease risk of preventable diseases and screening for fall risk and other medical concerns.    This visit is provided free of charge (no copay) for all Medicare recipients. The clinical pharmacists at [facility] have begun to conduct these Wellness Visits which will also include a thorough review of all your medications.    As you primary medical provider recommend that you make an appointment for your Annual Wellness Visit if you have not done so already this year.  You may set up this appointment before you leave today or you may call back ([phone]) and schedule an appointment.  Please make sure when you call that you mention that you are scheduling your Annual Wellness Visit with the clinical pharmacist so that the appointment may be made for the proper length of time.     Continue current medications. Continue good therapeutic lifestyle changes which include good diet and exercise. Fall precautions discussed with patient. If an FOBT was given today- please return it to our front desk. If you are over 36 years old - you may need Prevnar 72 or the adult Pneumonia vaccine.  Flu Shots are still available at our office. If you still haven't had one please call to set up a nurse visit to get one.   After your visit with [place] today you will receive a survey in the mail or online from [employer] regarding your care with [place]. Please take a moment to  fill this out. Your feedback is very important to [place] as you can help [place] better understand your patient needs as well as improve your experience and satisfaction. WE CARE ABOUT YOU!!!   Try to stay as physically active as possible Drink plenty of fluids this summer Continue to monitor stools and urine for blood loss Follow-up with specialist as planned including cardiology, urology, and gastroenterology.

## 2015-04-09 NOTE — Telephone Encounter (Signed)
noted 

## 2015-04-09 NOTE — Progress Notes (Signed)
Subjective:    Patient ID: [name], male    DOB: 1931/12/11, 81 y.o.   MRN: [id number]  HPI Pt here for follow up and management of chronic medical problems which includes hypertension, hyperlipidemia, and atrial fibrillation. He is taking medications regularly. As usual this patient has no complaints. His wife is seeing and a separate visit today but she indicates that she has a pretty good feeling about him when he is not doing well and she has not noticed any problems at home that indicate he is feeling bad. He continues to be followed by the urologist, the gastroenterologist, and the cardiologist. He recently had a rectal exam by Dr. Jeffie Pollock the urologist a couple weeks ago. He is due to get lab work today. He has no specific complaints as mentioned earlier. He does not need any refills. He will see the urologist again in 6 months he sees the cardiologist once a year. As long as he is not having any blood in the stool or his hemoglobin dropping he will defer getting any future colonoscopies. He denies chest pain shortness of breath trouble swallowing indigestion blood in the stool black tarry bowel movements or any further blood in the urine.      Patient Active Problem List   Diagnosis Date Noted  . Hematuria 01/22/2015  . Acute blood loss anemia 01/22/2015  . Urinary retention 01/21/2015  . UTI (lower urinary tract infection) 01/21/2015  . Cataract of both eyes 05/25/2014  . Long term current use of anticoagulant therapy 02/14/2013  . OVERWEIGHT 12/11/2009  . ANEMIA 02/25/2009  . HYPERTENSION 02/25/2009  . Coronary atherosclerosis 02/25/2009  . FIBRILLATION, ATRIAL 02/25/2009  . GERD 02/25/2009  . DEGENERATIVE JOINT DISEASE 02/25/2009   Outpatient Encounter Prescriptions as of 04/09/2015  Medication Sig  . acetaminophen (TYLENOL) 650 MG CR tablet Take 650 mg by mouth every 8 (eight) hours as needed for pain.   . carvedilol (COREG) 12.5 MG tablet Take 0.5 tablets (6.25 mg total)  by mouth 2 (two) times daily. (Patient taking differently: Take 12.5 mg by mouth 2 (two) times daily. )  . cholecalciferol (VITAMIN D) 400 UNITS TABS tablet Take 1,000 Units by mouth.  . diclofenac sodium (VOLTAREN) 1 % GEL Apply 1 gram to hands or other affected joints up to 4 times daily  . diltiazem (CARDIZEM CD) 120 MG 24 hr capsule Take 120 mg by mouth daily.   . finasteride (PROSCAR) 5 MG tablet Take 1 tablet (5 mg total) by mouth daily.  . furosemide (LASIX) 20 MG tablet TAKE ONE (1) TABLET EACH DAY  . lovastatin (MEVACOR) 10 MG tablet Take 10 mg by mouth at bedtime.  Marland Kitchen warfarin (COUMADIN) 5 MG tablet TAKE ONE (1) TABLET EACH DAY  . polyethylene glycol-electrolytes (NULYTELY/GOLYTELY) 420 G solution Take 4,000 mLs by mouth once. (Patient not taking: Reported on 04/09/2015)   No facility-administered encounter medications on file as of 04/09/2015.      Review of Systems  Constitutional: Negative.   HENT: Negative.   Eyes: Negative.   Respiratory: Negative.   Cardiovascular: Negative.   Gastrointestinal: Negative.   Endocrine: Negative.   Genitourinary: Negative.   Musculoskeletal: Negative.   Skin: Negative.   Allergic/Immunologic: Negative.   Neurological: Negative.   Hematological: Negative.   Psychiatric/Behavioral: Negative.        Objective:   Physical Exam  Constitutional: He is oriented to person, place, and time. He appears well-developed and well-nourished. No distress.  Alert and calm and demeanor  HENT:  Head: Normocephalic and atraumatic.  Right Ear: External ear normal.  Left Ear: External ear normal.  Nose: Nose normal.  Mouth/Throat: Oropharynx is clear and moist. No oropharyngeal exudate.  Eyes: Conjunctivae and EOM are normal. Pupils are equal, round, and reactive to light. Right eye exhibits no discharge. Left eye exhibits no discharge. No scleral icterus.  Neck: Normal range of motion. Neck supple. No thyromegaly present.  No carotid bruits or  anterior cervical adenopathy  Cardiovascular: Normal rate, normal heart sounds and intact distal pulses.   No murmur heard. The heart has an irregular irregular rhythm at 96/m  Pulmonary/Chest: Effort normal and breath sounds normal. No respiratory distress. He has no wheezes. He has no rales. He exhibits no tenderness.  Abdominal: Soft. Bowel sounds are normal. He exhibits no mass. There is no tenderness. There is no rebound and no guarding.  Musculoskeletal: Normal range of motion. He exhibits no edema or tenderness.  Lymphadenopathy:    He has no cervical adenopathy.  Neurological: He is alert and oriented to person, place, and time. He has normal reflexes. No cranial nerve deficit.  Skin: Skin is warm and dry. No rash noted. No erythema.  Psychiatric: He has a normal mood and affect. His behavior is normal. Judgment and thought content normal.  Nursing note and vitals reviewed.   BP 139/84 mmHg  Pulse 71  Temp(Src) 97.5 F (36.4 C) (Oral)  Ht _0  (1.702 m)  Wt 192 lb (87.091 kg)  BMI 30.06 kg/m2       Assessment & Plan:  1. Chronic atrial fibrillation -He is doing well with this with no shortness of breath chest pain or edema. He follows up regularly with cardiology. - POCT CBC  2. [name] (benign prostatic hyperplasia) -He just had a rectal exam CT scan of the abdomen and an ultrasound and the urologist was please with all the findings and had minimal blood in the urine and once again the urologist was please with this. He will follow-up again with the urologist in 6 months. - POCT CBC - PSA  3. Gastroesophageal reflux disease, esophagitis presence not specified -He has no complaints with reflux at this time. - POCT CBC - Hepatic function panel  4. Vitamin D deficiency -He will continue with his current vitamin D replacement pending results of lab work - POCT CBC - Vit D  25 hydroxy (rtn osteoporosis monitoring)  5. Hyperlipidemia -He will continue with his current  cluster all treatment pending results of lab work - POCT CBC - NMR, lipoprofile  6. Essential hypertension -The blood pressure is good today and he will continue with his cardiac enzyme and carvedilol and Lasix. - POCT CBC - BMP8+EGFR - Hepatic function panel  Patient Instructions                       Medicare Annual Wellness Visit  [facility] and the medical providers at [facility] strive to bring you the best medical care.  In doing so we not only want to address your current medical conditions and concerns but also to detect new conditions early and prevent illness, disease and health-related problems.    Medicare offers a yearly Wellness Visit which allows our clinical staff to assess your need for preventative services including immunizations, lifestyle education, counseling to decrease risk of preventable diseases and screening for fall risk and other medical concerns.    This visit is provided free of charge (no copay)  for all Medicare recipients. The clinical pharmacists at [facility] have begun to conduct these Wellness Visits which will also include a thorough review of all your medications.    As you primary medical provider recommend that you make an appointment for your Annual Wellness Visit if you have not done so already this year.  You may set up this appointment before you leave today or you may call back ([phone]) and schedule an appointment.  Please make sure when you call that you mention that you are scheduling your Annual Wellness Visit with the clinical pharmacist so that the appointment may be made for the proper length of time.     Continue current medications. Continue good therapeutic lifestyle changes which include good diet and exercise. Fall precautions discussed with patient. If an FOBT was given today- please return it to our front desk. If you are over 69 years old - you may need Prevnar 16 or the adult  Pneumonia vaccine.  Flu Shots are still available at our office. If you still haven't had one please call to set up a nurse visit to get one.   After your visit with [place] today you will receive a survey in the mail or online from [employer] regarding your care with [place]. Please take a moment to fill this out. Your feedback is very important to [place] as you can help [place] better understand your patient needs as well as improve your experience and satisfaction. WE CARE ABOUT YOU!!!   Try to stay as physically active as possible Drink plenty of fluids this summer Continue to monitor stools and urine for blood loss Follow-up with specialist as planned including cardiology, urology, and gastroenterology.   Arrie Senate MD

## 2015-04-10 DIAGNOSIS — H524 Presbyopia: Secondary | ICD-10-CM | POA: Diagnosis not present

## 2015-04-10 DIAGNOSIS — H02721 Madarosis of right upper eyelid and periocular area: Secondary | ICD-10-CM | POA: Diagnosis not present

## 2015-04-10 DIAGNOSIS — H5203 Hypermetropia, bilateral: Secondary | ICD-10-CM | POA: Diagnosis not present

## 2015-04-10 DIAGNOSIS — H52223 Regular astigmatism, bilateral: Secondary | ICD-10-CM | POA: Diagnosis not present

## 2015-04-10 LAB — HEPATIC FUNCTION PANEL
ALT: 13 IU/L (ref 0–44)
AST: 17 IU/L (ref 0–40)
Albumin: 4.3 g/dL (ref 3.5–4.7)
Alkaline Phosphatase: 65 IU/L (ref 39–117)
BILIRUBIN TOTAL: 0.5 mg/dL (ref 0.0–1.2)
BILIRUBIN, DIRECT: 0.11 mg/dL (ref 0.00–0.40)
TOTAL PROTEIN: 6.8 g/dL (ref 6.0–8.5)

## 2015-04-10 LAB — BMP8+EGFR
BUN/Creatinine Ratio: 14 (ref 10–22)
BUN: 14 mg/dL (ref 8–27)
CALCIUM: 9.4 mg/dL (ref 8.6–10.2)
CHLORIDE: 100 mmol/L (ref 97–108)
CO2: 27 mmol/L (ref 18–29)
Creatinine, Ser: 0.99 mg/dL (ref 0.76–1.27)
GFR calc Af Amer: 83 mL/min/{1.73_m2} (ref >59)
GFR calc non Af Amer: 72 mL/min/{1.73_m2} (ref >59)
Glucose: 110 mg/dL — ABNORMAL HIGH (ref 65–99)
POTASSIUM: 4.2 mmol/L (ref 3.5–5.2)
Sodium: 140 mmol/L (ref 134–144)

## 2015-04-10 LAB — NMR, LIPOPROFILE
Cholesterol: 294 mg/dL — ABNORMAL HIGH (ref 100–199)
HDL CHOLESTEROL BY NMR: 52 mg/dL (ref >39)
HDL Particle Number: 20.2 umol/L — ABNORMAL LOW (ref >=30.5)
LDL Particle Number: 2283 nmol/L — ABNORMAL HIGH (ref <1000)
LDL Size: 21.8 nm (ref >20.5)
LDL-C: 209 mg/dL — ABNORMAL HIGH (ref 0–99)
LP-IR Score: <25 (ref <=45)
SMALL LDL PARTICLE NUMBER: 650 nmol/L — AB (ref <=527)
TRIGLYCERIDES BY NMR: 166 mg/dL — AB (ref 0–149)

## 2015-04-10 LAB — PSA: Prostate Specific Ag, Serum: 7.2 ng/mL — ABNORMAL HIGH (ref 0.0–4.0)

## 2015-04-10 LAB — VITAMIN D 25 HYDROXY (VIT D DEFICIENCY, FRACTURES): Vit D, 25-Hydroxy: 31.4 ng/mL (ref 30.0–100.0)

## 2015-04-22 ENCOUNTER — Encounter (HOSPITAL_COMMUNITY): Payer: Self-pay

## 2015-04-22 ENCOUNTER — Ambulatory Visit (HOSPITAL_COMMUNITY): Admit: 2015-04-22 | Payer: Medicare Other | Admitting: Internal Medicine

## 2015-04-22 SURGERY — COLONOSCOPY
Anesthesia: Moderate Sedation

## 2015-04-30 ENCOUNTER — Ambulatory Visit (INDEPENDENT_AMBULATORY_CARE_PROVIDER_SITE_OTHER): Payer: Medicare Other | Admitting: Pharmacist Clinician (PhC)/ Clinical Pharmacy Specialist

## 2015-04-30 DIAGNOSIS — I482 Chronic atrial fibrillation, unspecified: Secondary | ICD-10-CM

## 2015-04-30 DIAGNOSIS — Z7901 Long term (current) use of anticoagulants: Secondary | ICD-10-CM

## 2015-04-30 LAB — POCT INR: INR: 3.3

## 2015-05-09 ENCOUNTER — Other Ambulatory Visit: Payer: Self-pay | Admitting: Family Medicine

## 2015-06-11 ENCOUNTER — Encounter: Payer: Self-pay | Admitting: Pharmacist Clinician (PhC)/ Clinical Pharmacy Specialist

## 2015-06-13 ENCOUNTER — Ambulatory Visit (INDEPENDENT_AMBULATORY_CARE_PROVIDER_SITE_OTHER): Payer: Medicare Other | Admitting: Pharmacist

## 2015-06-13 DIAGNOSIS — E785 Hyperlipidemia, unspecified: Secondary | ICD-10-CM | POA: Diagnosis not present

## 2015-06-13 DIAGNOSIS — Z7901 Long term (current) use of anticoagulants: Secondary | ICD-10-CM

## 2015-06-13 DIAGNOSIS — I482 Chronic atrial fibrillation, unspecified: Secondary | ICD-10-CM

## 2015-06-13 LAB — POCT INR: INR: 1.8

## 2015-06-13 NOTE — Patient Instructions (Signed)
Anticoagulation Dose Instructions as of 06/13/2015      Dorene Grebe Tue Wed Thu Fri Sat   New Dose 5 mg 2.5 mg 5 mg 5 mg 5 mg 5 mg 5 mg    Description        Take extra 1/2 tablet today - Thursday, September 1st.  Then resume regular dose of warfarin '5mg'$  1/2 tablet on mondays and 1 tablet all other days.     INR was 1.8 today

## 2015-06-13 NOTE — Progress Notes (Signed)
Subjective:     Indication: atrial fibrillation Bleeding signs/symptoms: None Thromboembolic signs/symptoms: None  Missed Coumadin doses: None Medication changes: yes - proscar started about 1-2 months ago and patient also reports missing a few doses of creator / rosuvastatin Dietary changes: no Bacterial/viral infection: no Other concerns: yes - last lipid panel showed very elevated LDL.   Objective:    INR Today: 1.8 Current dose: warfarin '5mg'$  - take 1/2 tablet on mondays and 1 tablet all other days.  Assessment:    Subtherapeutic INR for goal of 2-3   Dyslipidemia   Plan:    1. New dose: take extra 1/2 tablet of warfarin today then resume regular dose.   Restart rosuvastatin '5mg'$  twice weekly.  Discussed Repath or other PCSK9.  Information given to patient.  Will discuss again at next visit.   2. Next INR: 1 month    Cherre Robins, PharmD, CPP

## 2015-06-15 ENCOUNTER — Other Ambulatory Visit: Payer: Self-pay | Admitting: Family Medicine

## 2015-06-28 ENCOUNTER — Encounter: Payer: Self-pay | Admitting: Physician Assistant

## 2015-06-28 ENCOUNTER — Ambulatory Visit (INDEPENDENT_AMBULATORY_CARE_PROVIDER_SITE_OTHER): Payer: Medicare Other | Admitting: Physician Assistant

## 2015-06-28 VITALS — BP 144/86 | HR 75 | Temp 97.1°F | Ht 67.0 in | Wt 185.0 lb

## 2015-06-28 DIAGNOSIS — J309 Allergic rhinitis, unspecified: Secondary | ICD-10-CM

## 2015-06-28 DIAGNOSIS — J029 Acute pharyngitis, unspecified: Secondary | ICD-10-CM

## 2015-06-28 LAB — POCT RAPID STREP A (OFFICE): RAPID STREP A SCREEN: NEGATIVE

## 2015-06-28 MED ORDER — LORATADINE 10 MG PO TABS: 10.0000 mg | ORAL_TABLET | Freq: Every day | ORAL | Status: DC

## 2015-06-28 MED ORDER — FLUTICASONE PROPIONATE 50 MCG/ACT NA SUSP: 2.0000 | Freq: Every day | NASAL | Status: DC

## 2015-06-28 NOTE — Progress Notes (Signed)
   Subjective:    Patient ID: [name], male    DOB: [birth year], 81 y.o.   MRN: [id number]  HPI 81 y/o male presents with c/o sore throat and mucus in his throat. He had sinus drainage 2 weeks ago. Has tried tylenol with no relief     Review of Systems  Constitutional: Negative.   HENT: Positive for congestion (nasal ), ear pain, postnasal drip, sneezing and sore throat.   Respiratory: Positive for cough (rarely ).   Cardiovascular: Negative.   Gastrointestinal: Negative.   Endocrine: Negative.   Genitourinary: Negative.   Musculoskeletal: Negative.   All other systems reviewed and are negative.      Objective:   Physical Exam  Constitutional: He appears well-developed and well-nourished.  HENT:  Head: Normocephalic and atraumatic.  Right Ear: External ear normal.  Left Ear: External ear normal.  Mild posterior pharynx injection Nasal turbinates erythematous and boggy   Cardiovascular:  Irregular rhythm - chronic   Pulmonary/Chest: Effort normal and breath sounds normal.  Musculoskeletal: He exhibits no edema.  Vitals reviewed.         Assessment & Plan:  1. Sore throat  - POCT rapid strep A - Upper Respiratory Culture, Routine - fluticasone (FLONASE) 50 MCG/ACT nasal spray; Place 2 sprays into both nostrils daily.  Dispense: 16 g; Refill: 6 - loratadine (CLARITIN) 10 MG tablet; Take 1 tablet (10 mg total) by mouth daily.  Dispense: 30 tablet; Refill: 11  2. Allergic rhinitis, unspecified allergic rhinitis type - otc plain musinex - fluticasone (FLONASE) 50 MCG/ACT nasal spray; Place 2 sprays into both nostrils daily.  Dispense: 16 g; Refill: 6 - loratadine (CLARITIN) 10 MG tablet; Take 1 tablet (10 mg total) by mouth daily.  Dispense: 30 tablet; Refill: 11   Continue all meds Labs pending Health Maintenance reviewed Diet and exercise encouraged  Tiffany A. Benjamin Stain PA-C

## 2015-06-28 NOTE — Patient Instructions (Signed)
Over the counter plain musinex as directed

## 2015-07-02 ENCOUNTER — Ambulatory Visit: Payer: Self-pay | Admitting: Family Medicine

## 2015-07-02 LAB — UPPER RESPIRATORY CULTURE, ROUTINE

## 2015-07-03 ENCOUNTER — Other Ambulatory Visit: Payer: Self-pay | Admitting: Physician Assistant

## 2015-07-03 DIAGNOSIS — B965 Pseudomonas (aeruginosa) (mallei) (pseudomallei) as the cause of diseases classified elsewhere: Secondary | ICD-10-CM

## 2015-07-03 DIAGNOSIS — A498 Other bacterial infections of unspecified site: Principal | ICD-10-CM

## 2015-07-03 DIAGNOSIS — J988 Other specified respiratory disorders: Secondary | ICD-10-CM

## 2015-07-08 DIAGNOSIS — H47093 Other disorders of optic nerve, not elsewhere classified, bilateral: Secondary | ICD-10-CM | POA: Diagnosis not present

## 2015-07-08 DIAGNOSIS — H25812 Combined forms of age-related cataract, left eye: Secondary | ICD-10-CM | POA: Diagnosis not present

## 2015-07-08 DIAGNOSIS — H25813 Combined forms of age-related cataract, bilateral: Secondary | ICD-10-CM | POA: Diagnosis not present

## 2015-07-11 ENCOUNTER — Other Ambulatory Visit: Payer: Self-pay | Admitting: Physician Assistant

## 2015-07-11 ENCOUNTER — Ambulatory Visit: Payer: Self-pay

## 2015-07-11 MED ORDER — CLINDAMYCIN HCL 300 MG PO CAPS: 300.0000 mg | ORAL_CAPSULE | Freq: Three times a day (TID) | ORAL | Status: DC

## 2015-07-13 HISTORY — PX: EYE SURGERY: SHX253

## 2015-07-15 NOTE — Patient Instructions (Signed)
Your procedure is scheduled on: 07/22/2015  Report to [facility] at  42   AM.  Call this number if you have problems the morning of surgery: [phone]   Do not eat food or drink liquids :After Midnight.      Take these medicines the morning of surgery with A SIP OF WATER: coreg, diltiazem, proscar   Do not wear jewelry, make-up or nail polish.  Do not wear lotions, powders, or perfumes. You may wear deodorant.  Do not shave 48 hours prior to surgery.  Do not bring valuables to the hospital.  Contacts, dentures or bridgework may not be worn into surgery.  Leave suitcase in the car. After surgery it may be brought to your room.  For patients admitted to the hospital, checkout time is 11:00 AM the day of discharge.   Patients discharged the day of surgery will not be allowed to drive home.  :     Please read over the following fact sheets that you were given: Coughing and Deep Breathing, Surgical Site Infection Prevention, Anesthesia Post-op Instructions and Care and Recovery After Surgery    Cataract A cataract is a clouding of the lens of the eye. When a lens becomes cloudy, vision is reduced based on the degree and nature of the clouding. Many cataracts reduce vision to some degree. Some cataracts make people more near-sighted as they develop. Other cataracts increase glare. Cataracts that are ignored and become worse can sometimes look white. The white color can be seen through the pupil. CAUSES   Aging. However, cataracts may occur at any age, even in newborns.   Certain drugs.   Trauma to the eye.   Certain diseases such as diabetes.   Specific eye diseases such as chronic inflammation inside the eye or a sudden attack of a rare form of glaucoma.   Inherited or acquired medical problems.  SYMPTOMS   Gradual, progressive drop in vision in the affected eye.   Severe, rapid visual loss. This most often happens when trauma is the cause.  DIAGNOSIS  To detect a cataract, an  eye doctor examines the lens. Cataracts are best diagnosed with an exam of the eyes with the pupils enlarged (dilated) by drops.  TREATMENT  For an early cataract, vision may improve by using different eyeglasses or stronger lighting. If that does not help your vision, surgery is the only effective treatment. A cataract needs to be surgically removed when vision loss interferes with your everyday activities, such as driving, reading, or watching TV. A cataract may also have to be removed if it prevents examination or treatment of another eye problem. Surgery removes the cloudy lens and usually replaces it with a substitute lens (intraocular lens, IOL).  At a time when both you and your doctor agree, the cataract will be surgically removed. If you have cataracts in both eyes, only one is usually removed at a time. This allows the operated eye to heal and be out of danger from any possible problems after surgery (such as infection or poor wound healing). In rare cases, a cataract may be doing damage to your eye. In these cases, your caregiver may advise surgical removal right away. The vast majority of people who have cataract surgery have better vision afterward. HOME CARE INSTRUCTIONS  If you are not planning surgery, you may be asked to do the following:  Use different eyeglasses.   Use stronger or brighter lighting.   Ask your eye doctor about reducing your  medicine dose or changing medicines if it is thought that a medicine caused your cataract. Changing medicines does not make the cataract go away on its own.   Become familiar with your surroundings. Poor vision can lead to injury. Avoid bumping into things on the affected side. You are at a higher risk for tripping or falling.   Exercise extreme care when driving or operating machinery.   Wear sunglasses if you are sensitive to bright light or experiencing problems with glare.  SEEK IMMEDIATE MEDICAL CARE IF:   You have a worsening or sudden  vision loss.   You notice redness, swelling, or increasing pain in the eye.   You have a fever.  Document Released: 09/28/2005 Document Revised: 09/17/2011 Document Reviewed: 05/22/2011 [facility] Patient Information 2012 [place].PATIENT INSTRUCTIONS POST-ANESTHESIA  IMMEDIATELY FOLLOWING SURGERY:  Do not drive or operate machinery for the first twenty four hours after surgery.  Do not make any important decisions for twenty four hours after surgery or while taking narcotic pain medications or sedatives.  If you develop intractable nausea and vomiting or a severe headache please notify your doctor immediately.  FOLLOW-UP:  Please make an appointment with your surgeon as instructed. You do not need to follow up with anesthesia unless specifically instructed to do so.  WOUND CARE INSTRUCTIONS (if applicable):  Keep a dry clean dressing on the anesthesia/puncture wound site if there is drainage.  Once the wound has quit draining you may leave it open to air.  Generally you should leave the bandage intact for twenty four hours unless there is drainage.  If the epidural site drains for more than 36-48 hours please call the anesthesia department.  QUESTIONS?:  Please feel free to call your physician or the hospital operator if you have any questions, and they will be happy to assist you.

## 2015-07-16 ENCOUNTER — Encounter (HOSPITAL_COMMUNITY)
Admission: RE | Admit: 2015-07-16 | Discharge: 2015-07-16 | Disposition: A | Discharge status: Home / Self Care | Payer: Medicare Other | Source: Ambulatory Visit | Attending: Ophthalmology | Admitting: Ophthalmology

## 2015-07-16 ENCOUNTER — Encounter (HOSPITAL_COMMUNITY): Payer: Self-pay

## 2015-07-16 DIAGNOSIS — Z01818 Encounter for other preprocedural examination: Secondary | ICD-10-CM | POA: Insufficient documentation

## 2015-07-16 DIAGNOSIS — H2512 Age-related nuclear cataract, left eye: Secondary | ICD-10-CM | POA: Insufficient documentation

## 2015-07-16 LAB — CBC WITH DIFFERENTIAL/PLATELET
BASOS PCT: 1 %
Basophils Absolute: 0.1 10*3/uL (ref 0.0–0.1)
Eosinophils Absolute: 0.8 10*3/uL — ABNORMAL HIGH (ref 0.0–0.7)
Eosinophils Relative: 6 %
HEMATOCRIT: 40.6 % (ref 39.0–52.0)
Hemoglobin: 13.4 g/dL (ref 13.0–17.0)
Lymphocytes Relative: 43 %
Lymphs Abs: 5.5 10*3/uL — ABNORMAL HIGH (ref 0.7–4.0)
MCH: 32.9 pg (ref 26.0–34.0)
MCHC: 33 g/dL (ref 30.0–36.0)
MCV: 99.8 fL (ref 78.0–100.0)
MONO ABS: 0.6 10*3/uL (ref 0.1–1.0)
Monocytes Relative: 5 %
NEUTROS ABS: 5.8 10*3/uL (ref 1.7–7.7)
Neutrophils Relative %: 45 %
Platelets: 262 10*3/uL (ref 150–400)
RBC: 4.07 MIL/uL — ABNORMAL LOW (ref 4.22–5.81)
RDW: 13.4 % (ref 11.5–15.5)
WBC: 12.8 10*3/uL — ABNORMAL HIGH (ref 4.0–10.5)

## 2015-07-16 LAB — BASIC METABOLIC PANEL
Anion gap: 6 (ref 5–15)
BUN: 17 mg/dL (ref 6–20)
CO2: 28 mmol/L (ref 22–32)
CREATININE: 1.14 mg/dL (ref 0.61–1.24)
Calcium: 8.4 mg/dL — ABNORMAL LOW (ref 8.9–10.3)
Chloride: 106 mmol/L (ref 101–111)
GFR calc Af Amer: >60 mL/min (ref >60)
GFR calc non Af Amer: 59 mL/min — ABNORMAL LOW (ref >60)
GLUCOSE: 129 mg/dL — AB (ref 65–99)
Potassium: 4 mmol/L (ref 3.5–5.1)
Sodium: 140 mmol/L (ref 135–145)

## 2015-07-17 ENCOUNTER — Ambulatory Visit (INDEPENDENT_AMBULATORY_CARE_PROVIDER_SITE_OTHER): Payer: Medicare Other

## 2015-07-17 DIAGNOSIS — Z23 Encounter for immunization: Secondary | ICD-10-CM | POA: Diagnosis not present

## 2015-07-19 MED ORDER — LIDOCAINE HCL (PF) 1 % IJ SOLN
INTRAMUSCULAR | Status: AC
  Filled 2015-07-19: qty 2

## 2015-07-19 MED ORDER — TETRACAINE HCL 0.5 % OP SOLN
OPHTHALMIC | Status: AC
  Filled 2015-07-19: qty 2

## 2015-07-19 MED ORDER — LIDOCAINE HCL 3.5 % OP GEL
OPHTHALMIC | Status: AC
  Filled 2015-07-19: qty 1

## 2015-07-19 MED ORDER — PHENYLEPHRINE HCL 2.5 % OP SOLN
OPHTHALMIC | Status: AC
  Filled 2015-07-19: qty 15

## 2015-07-19 MED ORDER — NEOMYCIN-POLYMYXIN-DEXAMETH 3.5-10000-0.1 OP SUSP
OPHTHALMIC | Status: AC
  Filled 2015-07-19: qty 5

## 2015-07-19 MED ORDER — CYCLOPENTOLATE-PHENYLEPHRINE OP SOLN OPTIME - NO CHARGE
OPHTHALMIC | Status: AC
  Filled 2015-07-19: qty 2

## 2015-07-22 ENCOUNTER — Ambulatory Visit (HOSPITAL_COMMUNITY): Payer: Medicare Other | Admitting: Anesthesiology

## 2015-07-22 ENCOUNTER — Ambulatory Visit (HOSPITAL_COMMUNITY)
Admission: RE | Admit: 2015-07-22 | Discharge: 2015-07-22 | Disposition: A | Discharge status: Home / Self Care | Payer: Medicare Other | Source: Ambulatory Visit | Attending: Ophthalmology | Admitting: Ophthalmology

## 2015-07-22 ENCOUNTER — Encounter (HOSPITAL_COMMUNITY)
Admission: RE | Disposition: A | Discharge status: Home / Self Care | Payer: Self-pay | Source: Ambulatory Visit | Attending: Ophthalmology

## 2015-07-22 ENCOUNTER — Encounter (HOSPITAL_COMMUNITY): Payer: Self-pay | Admitting: *Deleted

## 2015-07-22 DIAGNOSIS — Z791 Long term (current) use of non-steroidal anti-inflammatories (NSAID): Secondary | ICD-10-CM | POA: Insufficient documentation

## 2015-07-22 DIAGNOSIS — Z7901 Long term (current) use of anticoagulants: Secondary | ICD-10-CM | POA: Insufficient documentation

## 2015-07-22 DIAGNOSIS — I2510 Atherosclerotic heart disease of native coronary artery without angina pectoris: Secondary | ICD-10-CM | POA: Diagnosis not present

## 2015-07-22 DIAGNOSIS — Z79899 Other long term (current) drug therapy: Secondary | ICD-10-CM | POA: Insufficient documentation

## 2015-07-22 DIAGNOSIS — K219 Gastro-esophageal reflux disease without esophagitis: Secondary | ICD-10-CM | POA: Diagnosis not present

## 2015-07-22 DIAGNOSIS — I10 Essential (primary) hypertension: Secondary | ICD-10-CM | POA: Diagnosis not present

## 2015-07-22 DIAGNOSIS — Z951 Presence of aortocoronary bypass graft: Secondary | ICD-10-CM | POA: Diagnosis not present

## 2015-07-22 DIAGNOSIS — H25812 Combined forms of age-related cataract, left eye: Secondary | ICD-10-CM | POA: Diagnosis not present

## 2015-07-22 DIAGNOSIS — H2512 Age-related nuclear cataract, left eye: Secondary | ICD-10-CM | POA: Diagnosis not present

## 2015-07-22 DIAGNOSIS — H269 Unspecified cataract: Secondary | ICD-10-CM | POA: Diagnosis not present

## 2015-07-22 DIAGNOSIS — E7800 Pure hypercholesterolemia, unspecified: Secondary | ICD-10-CM | POA: Diagnosis not present

## 2015-07-22 DIAGNOSIS — M1990 Unspecified osteoarthritis, unspecified site: Secondary | ICD-10-CM | POA: Diagnosis not present

## 2015-07-22 HISTORY — PX: CATARACT EXTRACTION W/PHACO: SHX586

## 2015-07-22 SURGERY — PHACOEMULSIFICATION, CATARACT, WITH IOL INSERTION
Anesthesia: Monitor Anesthesia Care | Site: Eye | Laterality: Left

## 2015-07-22 MED ORDER — EPINEPHRINE HCL 1 MG/ML IJ SOLN
INTRAMUSCULAR | Status: AC
  Filled 2015-07-22: qty 1

## 2015-07-22 MED ORDER — FENTANYL CITRATE (PF) 100 MCG/2ML IJ SOLN
25.0000 ug | INTRAMUSCULAR | Status: AC
  Administered 2015-07-22 (×2): 25 ug via INTRAVENOUS

## 2015-07-22 MED ORDER — LIDOCAINE HCL 3.5 % OP GEL
1.0000 "application " | Freq: Once | OPHTHALMIC | Status: AC
  Administered 2015-07-22: 1 via OPHTHALMIC

## 2015-07-22 MED ORDER — LACTATED RINGERS IV SOLN
INTRAVENOUS | Status: DC
  Administered 2015-07-22: 10:00:00 via INTRAVENOUS

## 2015-07-22 MED ORDER — PROVISC 10 MG/ML IO SOLN
INTRAOCULAR | Status: DC | PRN
  Administered 2015-07-22: 0.85 mL via INTRAOCULAR

## 2015-07-22 MED ORDER — CYCLOPENTOLATE-PHENYLEPHRINE 0.2-1 % OP SOLN
1.0000 [drp] | OPHTHALMIC | Status: AC
Start: 2015-07-22 — End: 2015-07-22
  Administered 2015-07-22 (×3): 1 [drp] via OPHTHALMIC

## 2015-07-22 MED ORDER — NEOMYCIN-POLYMYXIN-DEXAMETH 3.5-10000-0.1 OP SUSP
OPHTHALMIC | Status: DC | PRN
  Administered 2015-07-22: 2 [drp] via OPHTHALMIC

## 2015-07-22 MED ORDER — MIDAZOLAM HCL 2 MG/2ML IJ SOLN
INTRAMUSCULAR | Status: AC
  Filled 2015-07-22: qty 2

## 2015-07-22 MED ORDER — EPINEPHRINE HCL 1 MG/ML IJ SOLN
INTRAOCULAR | Status: DC | PRN
  Administered 2015-07-22: 500 mL

## 2015-07-22 MED ORDER — TETRACAINE HCL 0.5 % OP SOLN
1.0000 [drp] | OPHTHALMIC | Status: AC
  Administered 2015-07-22 (×3): 1 [drp] via OPHTHALMIC

## 2015-07-22 MED ORDER — MIDAZOLAM HCL 2 MG/2ML IJ SOLN
1.0000 mg | INTRAMUSCULAR | Status: DC | PRN
Start: 2015-07-22 — End: 2015-07-22
  Administered 2015-07-22: 2 mg via INTRAVENOUS

## 2015-07-22 MED ORDER — POVIDONE-IODINE 5 % OP SOLN
OPHTHALMIC | Status: DC | PRN
  Administered 2015-07-22: 1 via OPHTHALMIC

## 2015-07-22 MED ORDER — FENTANYL CITRATE (PF) 100 MCG/2ML IJ SOLN
INTRAMUSCULAR | Status: AC
  Filled 2015-07-22: qty 2

## 2015-07-22 MED ORDER — PHENYLEPHRINE HCL 2.5 % OP SOLN
1.0000 [drp] | OPHTHALMIC | Status: AC
  Administered 2015-07-22 (×3): 1 [drp] via OPHTHALMIC

## 2015-07-22 MED ORDER — LIDOCAINE HCL (PF) 1 % IJ SOLN
INTRAMUSCULAR | Status: DC | PRN
  Administered 2015-07-22: .4 mL

## 2015-07-22 MED ORDER — BSS IO SOLN
INTRAOCULAR | Status: DC | PRN
  Administered 2015-07-22: 15 mL

## 2015-07-22 SURGICAL SUPPLY — 12 items
CLOTH BEACON ORANGE TIMEOUT ST (SAFETY) ×2 IMPLANT
EYE SHIELD UNIVERSAL CLEAR (GAUZE/BANDAGES/DRESSINGS) ×2 IMPLANT
GLOVE BIOGEL PI IND STRL 6.5 (GLOVE) IMPLANT
GLOVE BIOGEL PI IND STRL 7.0 (GLOVE) IMPLANT
GLOVE BIOGEL PI INDICATOR 6.5 (GLOVE) ×2
GLOVE BIOGEL PI INDICATOR 7.0 (GLOVE) ×2
PAD ARMBOARD 7.5X6 YLW CONV (MISCELLANEOUS) ×2 IMPLANT
SIGHTPATH CAT PROC W REG LENS (Ophthalmic Related) ×3 IMPLANT
SYRINGE LUER LOK 1CC (MISCELLANEOUS) ×2 IMPLANT
TAPE SURG TRANSPORE 1 IN (GAUZE/BANDAGES/DRESSINGS) IMPLANT
TAPE SURGICAL TRANSPORE 1 IN (GAUZE/BANDAGES/DRESSINGS) ×2
WATER STERILE IRR 250ML POUR (IV SOLUTION) ×2 IMPLANT

## 2015-07-22 NOTE — Addendum Note (Signed)
Addendum  created 07/22/15 1149 by Donnella Bi, RN   Modules edited: Anesthesia Responsible Staff

## 2015-07-22 NOTE — Addendum Note (Signed)
Addendum  created 07/22/15 1613 by Mickel Baas, CRNA   Modules edited: Charges VN

## 2015-07-22 NOTE — Anesthesia Postprocedure Evaluation (Signed)
  Anesthesia Post-op Note  Patient: [name]  Procedure(s) Performed: Procedure(s): CATARACT EXTRACTION PHACO AND INTRAOCULAR LENS PLACEMENT LEFT EYE CDE=8.14 (Left)  Patient Location: Short Stay  Anesthesia Type:MAC  Level of Consciousness: awake, alert , oriented and patient cooperative  Airway and Oxygen Therapy: Patient Spontanous Breathing  Post-op Pain: none  Post-op Assessment: Post-op Vital signs reviewed, Patient's Cardiovascular Status Stable, Respiratory Function Stable, Patent Airway, No signs of Nausea or vomiting and Pain level controlled              Post-op Vital Signs: Reviewed and stable  Last Vitals:  Filed Vitals:   07/22/15 1010  BP: 151/97  Pulse:   Temp:   Resp: 14    Complications: No apparent anesthesia complications

## 2015-07-22 NOTE — H&P (Signed)
I have reviewed the H&P, the patient was re-examined, and I have identified no interval changes in medical condition and plan of care since the history and physical of record  

## 2015-07-22 NOTE — Transfer of Care (Signed)
Immediate Anesthesia Transfer of Care Note  Patient: [name]  Procedure(s) Performed: Procedure(s): CATARACT EXTRACTION PHACO AND INTRAOCULAR LENS PLACEMENT LEFT EYE CDE=8.14 (Left)  Patient Location: Short Stay  Anesthesia Type:MAC  Level of Consciousness: awake, alert , oriented and patient cooperative  Airway & Oxygen Therapy: Patient Spontanous Breathing  Post-op Assessment: Report given to RN and Post -op Vital signs reviewed and stable  Post vital signs: Reviewed and stable  Last Vitals:  Filed Vitals:   07/22/15 1010  BP: 151/97  Pulse:   Temp:   Resp: 14    Complications: No apparent anesthesia complications

## 2015-07-22 NOTE — Anesthesia Preprocedure Evaluation (Signed)
Anesthesia Evaluation  Patient identified by MRN, date of birth, ID band Patient awake    Reviewed: Allergy & Precautions, NPO status , Patient's Chart, lab work & pertinent test results  Airway Mallampati: II  TM Distance: >3 FB     Dental  (+) Upper Dentures   Pulmonary former smoker,    breath sounds clear to auscultation       Cardiovascular hypertension, Pt. on medications (-) angina+ [name] and + CABG  + dysrhythmias Atrial Fibrillation  Rhythm:Regular Rate:Normal     Neuro/Psych    GI/Hepatic PUD, GERD  Controlled,  Endo/Other    Renal/GU      Musculoskeletal   Abdominal   Peds  Hematology   Anesthesia Other Findings   Reproductive/Obstetrics                             Anesthesia Physical Anesthesia Plan  ASA: III  Anesthesia Plan: MAC   Post-op Pain Management:    Induction: Intravenous  Airway Management Planned: Nasal Cannula  Additional Equipment:   Intra-op Plan:   Post-operative Plan:   Informed Consent: I have reviewed the patients History and Physical, chart, labs and discussed the procedure including the risks, benefits and alternatives for the proposed anesthesia with the patient or authorized representative who has indicated his/her understanding and acceptance.     Plan Discussed with:   Anesthesia Plan Comments:         Anesthesia Quick Evaluation

## 2015-07-22 NOTE — Op Note (Signed)
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  Assistants: None  Anesthesia: Topical with MAC  Indications: Painless, progressive loss of vision with compromise of daily activities.  Surgery: Cataract Extraction with Intraocular lens Implant Left Eye  Discription: The patient had dilating drops and viscous lidocaine placed into the Left eye in the pre-op holding area. After transfer to the operating room, a time out was performed. The patient was then prepped and draped. Beginning with a 27 degree blade a paracentesis port was made at the surgeon's 2 o'clock position. The anterior chamber was then filled with 1% non-preserved lidocaine. This was followed by filling the anterior chamber with Provisc.  A 2.61m keratome blade was used to make a clear corneal incision at the temporal limbus.  A bent cystatome needle was used to create a continuous tear capsulotomy. Hydrodissection was performed with balanced salt solution on a Fine canula. The lens nucleus was then removed using the phacoemulsification handpiece. Residual cortex was removed with the I&A handpiece. The anterior chamber and capsular bag were refilled with Provisc. A posterior chamber intraocular lens was placed into the capsular bag with it's injector. The implant was positioned with the Kuglan hook. The Provisc was then removed from the anterior chamber and capsular bag with the I&A handpiece. Stromal hydration of the main incision and paracentesis port was performed with BSS on a Fine canula. The wounds were tested for leak which was negative. The patient tolerated the procedure well. There were no operative complications. The patient was then transferred to the recovery room in stable condition.  Complications: None  Specimen: None  EBL: None  Prosthetic device: Hoya iSert 250, power 17.5 D,  SN [id number]

## 2015-07-22 NOTE — Discharge Instructions (Signed)
Monitored Anesthesia Care Monitored anesthesia care is an anesthesia service for a medical procedure. Anesthesia is the loss of the ability to feel pain. It is produced by medicines called anesthetics. It may affect a small area of your body (local anesthesia), a large area of your body (regional anesthesia), or your entire body (general anesthesia). The need for monitored anesthesia care depends your procedure, your condition, and the potential need for regional or general anesthesia. It is often provided during procedures where:   General anesthesia may be needed if there are complications. This is because you need special care when you are under general anesthesia.   You will be under local or regional anesthesia. This is so that you are able to have higher levels of anesthesia if needed.   You will receive calming medicines (sedatives). This is especially the case if sedatives are given to put you in a semi-conscious state of relaxation (deep sedation). This is because the amount of sedative needed to produce this state can be hard to predict. Too much of a sedative can produce general anesthesia. Monitored anesthesia care is performed by one or more health care providers who have special training in all types of anesthesia. You will need to meet with these health care providers before your procedure. During this meeting, they will ask you about your medical history. They will also give you instructions to follow. (For example, you will need to stop eating and drinking before your procedure. You may also need to stop or change medicines you are taking.) During your procedure, your health care providers will stay with you. They will:   Watch your condition. This includes watching your blood pressure, breathing, and level of pain.   Diagnose and treat problems that occur.   Give medicines if they are needed. These may include calming medicines (sedatives) and anesthetics.   Make sure you are  comfortable.  Having monitored anesthesia care does not necessarily mean that you will be under anesthesia. It does mean that your health care providers will be able to manage anesthesia if you need it or if it occurs. It also means that you will be able to have a different type of anesthesia than you are having if you need it. When your procedure is complete, your health care providers will continue to watch your condition. They will make sure any medicines wear off before you are allowed to go home.    This information is not intended to replace advice given to you by your health care provider. Make sure you discuss any questions you have with your health care provider.   Document Released: 06/24/2005 Document Revised: 10/19/2014 Document Reviewed: 11/09/2012 Elsevier Interactive Patient Education [employer].

## 2015-07-22 NOTE — Anesthesia Procedure Notes (Signed)
Procedure Name: MAC Date/Time: 07/22/2015 10:15 AM Performed by: Andree Elk, Shawn Carattini A Pre-anesthesia Checklist: Timeout performed, Patient identified, Emergency Drugs available, Suction available and Patient being monitored Oxygen Delivery Method: Nasal cannula

## 2015-07-23 ENCOUNTER — Encounter (HOSPITAL_COMMUNITY): Payer: Self-pay | Admitting: Ophthalmology

## 2015-07-25 ENCOUNTER — Ambulatory Visit (INDEPENDENT_AMBULATORY_CARE_PROVIDER_SITE_OTHER): Payer: Medicare Other | Admitting: Pharmacist

## 2015-07-25 ENCOUNTER — Encounter: Payer: Self-pay | Admitting: Pharmacist

## 2015-07-25 VITALS — BP 148/82 | HR 75 | Ht 67.0 in | Wt 195.0 lb

## 2015-07-25 DIAGNOSIS — Z0000 Encounter for general adult medical examination without abnormal findings: Secondary | ICD-10-CM | POA: Diagnosis not present

## 2015-07-25 DIAGNOSIS — I482 Chronic atrial fibrillation, unspecified: Secondary | ICD-10-CM

## 2015-07-25 DIAGNOSIS — E8881 Metabolic syndrome: Secondary | ICD-10-CM | POA: Insufficient documentation

## 2015-07-25 DIAGNOSIS — I4819 Other persistent atrial fibrillation: Secondary | ICD-10-CM

## 2015-07-25 DIAGNOSIS — Z7901 Long term (current) use of anticoagulants: Secondary | ICD-10-CM

## 2015-07-25 LAB — POCT INR: INR: 2.8

## 2015-07-25 NOTE — Patient Instructions (Signed)
Anticoagulation Dose Instructions as of 07/25/2015      [name] Tue Wed Thu Fri Sat   New Dose 5 mg 2.5 mg 5 mg 5 mg 5 mg 5 mg 5 mg    Description        Continue regular dose of warfarin '5mg'$  - take 1/2 tablet on mondays and 1 tablet all other days.     INR was 2.8 today    [name] , Thank you for taking time to come for your Medicare Wellness Visit. I appreciate your ongoing commitment to your health goals. Please review the following plan we discussed and let me know if I can assist you in the future.   These are the goals we discussed: Increase physical activity - goal is to work up to 30 minutes 4 to 5 days per week.  Try to do chair exercises.   Blood glucose was slightly elevated - recommend limit high carbohydrate foods and sugar containing foods.   Increase non-starchy vegetables - carrots, green bean, squash, zucchini, tomatoes, onions, peppers, cabbage, lettuce, cucumbers (peel), asparagus, okra (not fried), eggplant Limit sugar and processed foods (cakes, cookies, ice cream, crackers and chips) No sugar containing beverages - sweet tea, soda or juices Increase fresh fruit but limit serving sizes 1/2 cup or about the size of tennis or baseball Limit red meat to no more than 1-2 times per week (serving size about the size of your palm) Choose whole grains / lean proteins - whole wheat bread, quinoa, whole grain rice (1/2 cup), fish, chicken, [place]   This is a list of the screening recommended for you and due dates:  Health Maintenance  Topic Date Due  . Shingles Vaccine  Due now - will continue to check cost  . Pneumonia vaccines (2 of 2 - PPSV23) Completed  . Flu Shot  05/12/2016  . Tetanus Vaccine  10/12/2021  *Topic was postponed. The date shown is not the original due date.

## 2015-07-25 NOTE — Progress Notes (Signed)
Patient ID: [name], male   DOB: Feb 24, 1932, 81 y.o.   MRN: [id number]  Patient saw Deberah Castle 03/2015 a NP that works with Dr Laural Golden.  She has recommended colonoscopy at that appt. Due to history of colon polyps. Encouraged patient to make this appointment .

## 2015-07-25 NOTE — Progress Notes (Signed)
Patient ID: KAYD LAUNER, male   DOB: 1932/08/27, 81 y.o.   MRN: [id number]    Subjective:   YICHEN GILARDI is a 81 y.o. married, white male who presents for a subsequent  Medicare Annual Wellness Visit.  His PCP - Dr Laurance Flatten- also has request discussion about PSK9 therapy due to history of statin intolerance and significant LDL elevated / CHD.  Mr. Sollenberger has not been able to tolerate Crestor (even '5mg'$  twcie a week), atorvastatin, Livalo, pravastatin, Zetia or Liptruzet (atorvastatin + Zetia combo) in past - all statins with the exception of his current lovastatin '40mg'$ .  He is only taking lovastatin '40mg'$  3 times per week because this is highest dose he can tolerate.  His LDL remain very elevated.   Mr Devan is alert and oriented and in NAD.    In reviewing labs I also note that a BG that was elevated at 129 (07/16/2015).      Current Medications (verified) Outpatient Encounter Prescriptions as of 07/25/2015  Medication Sig  . acetaminophen (TYLENOL) 650 MG CR tablet Take 650 mg by mouth every 8 (eight) hours as needed for pain.   Marland Kitchen BESIVANCE 0.6 % SUSP   . carvedilol (COREG) 12.5 MG tablet Take 0.5 tablets (6.25 mg total) by mouth 2 (two) times daily.  . cholecalciferol (VITAMIN D) 400 UNITS TABS tablet Take 1,000 Units by mouth.  . clindamycin (CLEOCIN) 300 MG capsule Take 1 capsule (300 mg total) by mouth 3 (three) times daily.  Marland Kitchen diltiazem (CARDIZEM) 30 MG tablet Take 120 mg by mouth daily.  . DUREZOL 0.05 % EMUL   . finasteride (PROSCAR) 5 MG tablet TAKE ONE (1) TABLET EACH DAY  . furosemide (LASIX) 20 MG tablet TAKE ONE (1) TABLET EACH DAY  . loratadine (CLARITIN) 10 MG tablet Take 1 tablet (10 mg total) by mouth daily.  Marland Kitchen lovastatin (MEVACOR) 40 MG tablet Take 40 mg by mouth daily. Take 3 times per week  . PROLENSA 0.07 % SOLN   . warfarin (COUMADIN) 5 MG tablet TAKE ONE (1) TABLET EACH DAY  . fluticasone (FLONASE) 50 MCG/ACT nasal spray Place 2 sprays into both nostrils daily. (Patient  not taking: Reported on 07/25/2015)  . [DISCONTINUED] diclofenac sodium (VOLTAREN) 1 % GEL Apply 1 gram to hands or other affected joints up to 4 times daily (Patient not taking: Reported on 07/25/2015)  . [DISCONTINUED] diltiazem (CARDIZEM CD) 120 MG 24 hr capsule TAKE ONE (1) CAPSULE EACH DAY (Patient not taking: Reported on 07/25/2015)  . [DISCONTINUED] rosuvastatin (CRESTOR) 5 MG tablet Take 5 mg by mouth 2 (two) times a week.   No facility-administered encounter medications on file as of 07/25/2015.    Allergies (verified) Penicillins; Hct; Liptruzet; and Statins   History: Past Medical History  Diagnosis Date  . Hypertension   . Persistent atrial fibrillation ([place])   . Coronary artery disease     5 bypasses  . Ejection fraction < 50%     Mildly reduced, 40% by echo  . Anemia   . Cellulitis     In past  . DJD (degenerative joint disease)   . GERD (gastroesophageal reflux disease)   . PUD (peptic ulcer disease)   . Skin cancer of eyelid     Resected  . Atrial fibrillation ([place])   . Prostate hypertrophy     on CT scan 09/2014  . Cataract   . Hyperlipidemia    Past Surgical History  Procedure Laterality Date  . Coronary artery  bypass graft  4/05    LIMA to LAD, SVG to PDA, SVG to ramus intermediate  . Heart bypass  2005  . Total knee arthroplasty      Right  . Appendectomy    . Eyelid carcinoma excision      Skin cancer resected  . Total hip arthroplasty      Right  . Bypass graft  2005  . Cataract extraction w/phaco Left 07/22/2015    Procedure: CATARACT EXTRACTION PHACO AND INTRAOCULAR LENS PLACEMENT LEFT EYE CDE=8.14;  Surgeon: Tonny Branch, MD;  Location: [facility];  Service: Ophthalmology;  Laterality: Left;  Marland Kitchen Eye surgery  07/2015   Family History  Problem Relation Age of Onset  . Stroke Mother   . Stroke Father   . Hypertension Father   . Early death Brother     66 months old  . Cancer Brother     lung  . Stroke Brother     heat   Social History    Occupational History  . retired    Social History Main Topics  . Smoking status: Former Smoker -- 2.00 packs/day for 5 years    Types: Cigarettes    Quit date: 12/23/1963  . Smokeless tobacco: Never Used  . Alcohol Use: No  . Drug Use: No  . Sexual Activity: Yes    Do you feel safe at home?  Yes  Dietary issues and exercise activities discussed: Current Exercise Habits:: Home exercise routine, Type of exercise: walking, Time (Minutes): 10, Frequency (Times/Week): 2, Weekly Exercise (Minutes/Week): 20, Intensity: Mild  Current Dietary habits:  No fried foods; tried to limit beef and pork.  However he is eating a lot of bread.  Cardiac Risk Factors include: advanced age (>16mn, >>68women);dyslipidemia;family history of premature cardiovascular disease;hypertension;male gender;obesity (BMI >30kg/m2)  Bowling once weekly on Wednesdays.  Objective:    Today's Vitals   07/25/15 0831  BP: 148/82  Pulse: 75  Height: '5\' 7"'$  (1.702 m)  Weight: 195 lb (88.451 kg)   Body mass index is 30.53 kg/(m^2).   INR was 2.8 in office today  Lipid Panel (04/08/2105) LDL = 209 HDL = 52 Tg = 166 Total Cholesterol = 294   Activities of Daily Living In your present state of health, do you have any difficulty performing the following activities: 07/25/2015 07/16/2015  Hearing? N N  Vision? Y Y  Difficulty concentrating or making decisions? N N  Walking or climbing stairs? N Y  Dressing or bathing? N N  Doing errands, shopping? N N  Preparing Food and eating ? N -  Using the Toilet? N -  In the past six months, have you accidently leaked urine? N -  Do you have problems with loss of bowel control? N -  Managing your Medications? N -  Managing your Finances? N -  Housekeeping or managing your Housekeeping? N -   Are there smokers in your home (other than you)? No   Depression Screen PHQ 2/9 Scores 07/25/2015 04/09/2015 11/19/2014 05/25/2014  PHQ - 2 Score 0 0 0 0    Fall Risk Fall  Risk  07/25/2015 04/09/2015 11/19/2014 05/25/2014 02/27/2014  Falls in the past year? No No No No No    Cognitive Function: MMSE - Mini Mental State Exam 07/25/2015  Orientation to time 5  Orientation to Place 5  Registration 3  Attention/ Calculation 3  Recall 3  Language- name 2 objects 2  Language- repeat 1  Language- follow 3 step command  3  Language- read & follow direction 1  Write a sentence 1  Copy design 1  Total score 28    Immunizations and Health Maintenance Immunization History  Administered Date(s) Administered  . Influenza,inj,Quad PF,36+ Mos 07/17/2013, 07/05/2014, 07/17/2015  . Pneumococcal Conjugate-13 10/30/2013  . Tdap 10/13/2011   Health Maintenance Due  Topic Date Due  . ZOSTAVAX  08/31/1994    Patient Care Team: Chipper Herb, MD as PCP - General (Family Medicine) Irine Seal, MD as Attending Physician (Urology) Minus Breeding, MD as Consulting Physician (Cardiology) Rogene Houston, MD as Consulting Physician (Gastroenterology) Tonny Branch, MD as Consulting Physician (Ophthalmology) [name], MD as Consulting Physician (Orthopedic Surgery)  Indicate any recent [place] you may have received from other than [facility] providers in the past year (date may be approximate).    Assessment:    Annual Wellness Visit  Hyperlipidemia with CVD Therapeutic anticoagulation Metabolic syndrome / elevated BG    Screening Tests Health Maintenance  Topic Date Due  . ZOSTAVAX  08/31/1994  . PNA vac Low Risk Adult (2 of 2 - PPSV23) 10/09/2015 (Originally 10/30/2014)  . INFLUENZA VACCINE  05/12/2016  . TETANUS/TDAP  10/12/2021        Plan:   During the course of the visit Kaushal was educated and counseled about the following appropriate screening and preventive services:   Vaccines to include Pneumoccal, Influenza, Hepatitis B, Td, Zostavax - only Zostavax due but postpones due to cost  Colorectal cancer screening - FOBT is UTD;  Last  colonoscopy was over 11 years ago - patient to check with Dr Laural Golden to see if colonoscopy still recommended.   Cardiovascular disease screening - LDL is not at goal with current statin therapy and patient cannot tolerate other statins or higher lovastatin dose.  Discussed Repartha.  Patient to consider.  Will do insurance verification.    BP was elevated today in office - RTC in 1 month to have recheck by PCP.  Diabetes screening - elevated BG.  Discussed limiting high sugar and CHO containing foods (especially bread).  Recommend check A1c when next labs drawn next month.  Glaucoma screening / Eye Exam - UTD  Nutrition counseling - See above (diabetes screening).  These changes should also help with weight loss  Prostate cancer screening - UTD  Advanced Directives - patient has information declined today  Increase physical activity as able.  RTC in 1 month to see Dr Laurance Flatten and January to see clinical pharmacist  Anticoagulation Dose Instructions as of 07/25/2015      Dorene Grebe Tue Wed Thu Fri Sat   New Dose 5 mg 2.5 mg 5 mg 5 mg 5 mg 5 mg 5 mg    Description        Continue regular dose of warfarin '5mg'$  - take 1/2 tablet on mondays and 1 tablet all other days.        Patient Instructions (the written plan) were given to the patient.   Cherre Robins, [facility]   07/25/2015

## 2015-08-05 ENCOUNTER — Other Ambulatory Visit (HOSPITAL_COMMUNITY): Payer: Self-pay

## 2015-08-06 ENCOUNTER — Other Ambulatory Visit (HOSPITAL_COMMUNITY): Payer: Self-pay

## 2015-08-08 ENCOUNTER — Encounter (HOSPITAL_COMMUNITY): Admission: RE | Payer: Self-pay | Source: Ambulatory Visit

## 2015-08-08 ENCOUNTER — Ambulatory Visit (HOSPITAL_COMMUNITY): Admission: RE | Admit: 2015-08-08 | Payer: Medicare Other | Source: Ambulatory Visit | Admitting: Ophthalmology

## 2015-08-08 SURGERY — PHACOEMULSIFICATION, CATARACT, WITH IOL INSERTION
Anesthesia: Monitor Anesthesia Care | Site: Eye | Laterality: Right

## 2015-08-13 ENCOUNTER — Other Ambulatory Visit: Payer: Self-pay | Admitting: Pharmacist

## 2015-08-13 ENCOUNTER — Telehealth: Payer: Self-pay | Admitting: Family Medicine

## 2015-08-13 MED ORDER — ALIROCUMAB 75 MG/ML ~~LOC~~ SOPN: 75.0000 mg | PEN_INJECTOR | SUBCUTANEOUS | Status: DC

## 2015-08-13 NOTE — Progress Notes (Signed)
Repatha PA was denied - patient must try Praluent for 12 weeks and fail in order for Repatha to be covered.  Rx sent to [facility] for Praluent '75mg'$  inject SQ every 2 weeks.

## 2015-08-13 NOTE — Telephone Encounter (Signed)
Disregard per [facility]

## 2015-08-15 ENCOUNTER — Other Ambulatory Visit: Payer: Self-pay | Admitting: Family Medicine

## 2015-08-15 DIAGNOSIS — H02401 Unspecified ptosis of right eyelid: Secondary | ICD-10-CM | POA: Diagnosis not present

## 2015-08-15 DIAGNOSIS — H25811 Combined forms of age-related cataract, right eye: Secondary | ICD-10-CM | POA: Diagnosis not present

## 2015-08-15 DIAGNOSIS — H02721 Madarosis of right upper eyelid and periocular area: Secondary | ICD-10-CM | POA: Diagnosis not present

## 2015-08-15 DIAGNOSIS — H16143 Punctate keratitis, bilateral: Secondary | ICD-10-CM | POA: Diagnosis not present

## 2015-09-02 ENCOUNTER — Other Ambulatory Visit: Payer: Self-pay | Admitting: Family Medicine

## 2015-09-03 ENCOUNTER — Ambulatory Visit (INDEPENDENT_AMBULATORY_CARE_PROVIDER_SITE_OTHER): Payer: Medicare Other

## 2015-09-03 ENCOUNTER — Encounter: Payer: Self-pay | Admitting: Family Medicine

## 2015-09-03 ENCOUNTER — Ambulatory Visit (INDEPENDENT_AMBULATORY_CARE_PROVIDER_SITE_OTHER): Payer: Medicare Other | Admitting: Family Medicine

## 2015-09-03 VITALS — BP 139/83 | HR 88 | Temp 97.1°F | Ht 67.0 in | Wt 196.0 lb

## 2015-09-03 DIAGNOSIS — I481 Persistent atrial fibrillation: Secondary | ICD-10-CM

## 2015-09-03 DIAGNOSIS — I10 Essential (primary) hypertension: Secondary | ICD-10-CM

## 2015-09-03 DIAGNOSIS — E785 Hyperlipidemia, unspecified: Secondary | ICD-10-CM | POA: Diagnosis not present

## 2015-09-03 DIAGNOSIS — I482 Chronic atrial fibrillation, unspecified: Secondary | ICD-10-CM

## 2015-09-03 DIAGNOSIS — J988 Other specified respiratory disorders: Secondary | ICD-10-CM

## 2015-09-03 DIAGNOSIS — I700 Atherosclerosis of aorta: Secondary | ICD-10-CM

## 2015-09-03 DIAGNOSIS — I4819 Other persistent atrial fibrillation: Secondary | ICD-10-CM

## 2015-09-03 DIAGNOSIS — R05 Cough: Secondary | ICD-10-CM | POA: Diagnosis not present

## 2015-09-03 DIAGNOSIS — E8881 Metabolic syndrome: Secondary | ICD-10-CM | POA: Diagnosis not present

## 2015-09-03 DIAGNOSIS — K219 Gastro-esophageal reflux disease without esophagitis: Secondary | ICD-10-CM | POA: Diagnosis not present

## 2015-09-03 DIAGNOSIS — J40 Bronchitis, not specified as acute or chronic: Secondary | ICD-10-CM | POA: Diagnosis not present

## 2015-09-03 DIAGNOSIS — R059 Cough, unspecified: Secondary | ICD-10-CM

## 2015-09-03 DIAGNOSIS — E559 Vitamin D deficiency, unspecified: Secondary | ICD-10-CM | POA: Diagnosis not present

## 2015-09-03 DIAGNOSIS — Z7901 Long term (current) use of anticoagulants: Secondary | ICD-10-CM

## 2015-09-03 DIAGNOSIS — Z7689 Persons encountering health services in other specified circumstances: Secondary | ICD-10-CM | POA: Diagnosis not present

## 2015-09-03 DIAGNOSIS — J209 Acute bronchitis, unspecified: Secondary | ICD-10-CM

## 2015-09-03 LAB — POCT INR: INR: 3.8

## 2015-09-03 MED ORDER — PREDNISONE 10 MG PO TABS: ORAL_TABLET | ORAL | Status: DC

## 2015-09-03 MED ORDER — METHYLPREDNISOLONE ACETATE 80 MG/ML IJ SUSP
60.0000 mg | Freq: Once | INTRAMUSCULAR | Status: AC
  Administered 2015-09-03: 60 mg via INTRAMUSCULAR

## 2015-09-03 MED ORDER — CLINDAMYCIN HCL 300 MG PO CAPS: 300.0000 mg | ORAL_CAPSULE | Freq: Three times a day (TID) | ORAL | Status: DC

## 2015-09-03 NOTE — Patient Instructions (Addendum)
Medicare Annual Wellness Visit  [place] and the medical providers at [facility] strive to bring you the best medical care.  In doing so we not only want to address your current medical conditions and concerns but also to detect new conditions early and prevent illness, disease and health-related problems.    Medicare offers a yearly Wellness Visit which allows our clinical staff to assess your need for preventative services including immunizations, lifestyle education, counseling to decrease risk of preventable diseases and screening for fall risk and other medical concerns.    This visit is provided free of charge (no copay) for all Medicare recipients. The clinical pharmacists at [facility] have begun to conduct these Wellness Visits which will also include a thorough review of all your medications.    As you primary medical provider recommend that you make an appointment for your Annual Wellness Visit if you have not done so already this year.  You may set up this appointment before you leave today or you may call back ([phone]) and schedule an appointment.  Please make sure when you call that you mention that you are scheduling your Annual Wellness Visit with the clinical pharmacist so that the appointment may be made for the proper length of time.     Continue current medications. Continue good therapeutic lifestyle changes which include good diet and exercise. Fall precautions discussed with patient. If an FOBT was given today- please return it to our front desk. If you are over 17 years old - you may need Prevnar 29 or the adult Pneumonia vaccine.  **Flu shots are available--- please call and schedule a FLU-CLINIC appointment**  After your visit with [place] today you will receive a survey in the mail or online from [employer] regarding your care with [place]. Please take a moment to fill this out. Your feedback is very  important to [place] as you can help [place] better understand your patient needs as well as improve your experience and satisfaction. WE CARE ABOUT YOU!!!   The patient should continue with his Mucinex He should take the antibiotic as directed He should drink plenty of fluids He should continue with his nasal spray and Claritin He should take the prednisone as directed He should return to clinic in 10-14 days for recheck and if he gets worse he should return sooner Please return the FOBT  Anticoagulation Dose Instructions as of 09/03/2015      Dorene Grebe Tue Wed Thu Fri Sat   New Dose 2.5 mg 2.5 mg 0 mg 0 mg 2.5 mg 2.5 mg 2.5 mg   Alt Week 2.5 mg 2.5 mg 2.5 mg 5 mg 5 mg 2.5 mg 5 mg    Description        No warfarin today or tomorrow - then take only 1/2 tablet while taking prednisone.  After finished with prednisone and clindamycin start new dose of warfarin '5mg'$  - take 1/2 tablet on mondays and fridays and 1 tablet all other days.     INR was 3.8 today

## 2015-09-03 NOTE — Progress Notes (Signed)
Subjective:    Patient ID: [name], male    DOB: [birth year], 81 y.o.   MRN: [id number]  HPI Pt here for follow up and management of chronic medical problems which includes hypertension, hyperlipidemia, and atrial fibrillation. He is taking medications regularly. It is important to note that the clinical pharmacist is working on getting this patient started  on Praulent. He does complain of some congestion in his upper chest. He says he's been coughing up some yellow phlegm and sputum and that this was similar to the past and started with upper sinus congestion and ended up getting in his chest when he is doing a lot of wheezing. Other than the shortness of breath associated with this congestion, he is not having any chest pain. He denies any problems with swallowing heartburn indigestion nausea vomiting or diarrhea or blood in the stool. He is passing his water well now but he is also concerned about his prostate as he is taking Proscar. He plans to make an appointment to follow-up with the urologist about this.       Patient Active Problem List   Diagnosis Date Noted  . Metabolic syndrome 46/50/3546  . Hematuria 01/22/2015  . Acute blood loss anemia 01/22/2015  . Urinary retention 01/21/2015  . UTI (lower urinary tract infection) 01/21/2015  . Cataract of both eyes 05/25/2014  . Long term current use of anticoagulant therapy 02/14/2013  . OVERWEIGHT 12/11/2009  . ANEMIA 02/25/2009  . HYPERTENSION 02/25/2009  . Coronary atherosclerosis 02/25/2009  . Atrial fibrillation, persistent ([place]) 02/25/2009  . GERD 02/25/2009  . DEGENERATIVE JOINT DISEASE 02/25/2009   Outpatient Encounter Prescriptions as of 09/03/2015  Medication Sig  . acetaminophen (TYLENOL) 650 MG CR tablet Take 650 mg by mouth every 8 (eight) hours as needed for pain.   . Alirocumab (PRALUENT) 75 MG/ML SOPN Inject 75 mg into the skin every 14 (fourteen) days.  Marland Kitchen BESIVANCE 0.6 % SUSP   . carvedilol (COREG) 12.5 MG  tablet Take 0.5 tablets (6.25 mg total) by mouth 2 (two) times daily.  . cholecalciferol (VITAMIN D) 400 UNITS TABS tablet Take 1,000 Units by mouth.  . diltiazem (DILACOR XR) 120 MG 24 hr capsule Take 120 mg by mouth daily.  . DUREZOL 0.05 % EMUL   . finasteride (PROSCAR) 5 MG tablet TAKE ONE (1) TABLET EACH DAY  . fluticasone (FLONASE) 50 MCG/ACT nasal spray Place 2 sprays into both nostrils daily.  . furosemide (LASIX) 20 MG tablet TAKE ONE (1) TABLET EACH DAY  . loratadine (CLARITIN) 10 MG tablet Take 1 tablet (10 mg total) by mouth daily.  Marland Kitchen lovastatin (MEVACOR) 40 MG tablet Take 40 mg by mouth daily. Take 3 times per week  . PROLENSA 0.07 % SOLN   . warfarin (COUMADIN) 5 MG tablet TAKE ONE (1) TABLET EACH DAY  . [DISCONTINUED] clindamycin (CLEOCIN) 300 MG capsule Take 1 capsule (300 mg total) by mouth 3 (three) times daily.  . [DISCONTINUED] diltiazem (CARDIZEM) 30 MG tablet Take 120 mg by mouth daily.   No facility-administered encounter medications on file as of 09/03/2015.     Review of Systems  Constitutional: Negative.   HENT: Positive for congestion.   Eyes: Negative.   Respiratory: Negative.   Cardiovascular: Negative.   Gastrointestinal: Negative.   Endocrine: Negative.   Genitourinary: Negative.   Musculoskeletal: Negative.   Skin: Negative.   Allergic/Immunologic: Negative.   Neurological: Negative.   Hematological: Negative.   Psychiatric/Behavioral: Negative.  Objective:   Physical Exam  Constitutional: He is oriented to person, place, and time. He appears well-developed and well-nourished. No distress.  HENT:  Head: Normocephalic and atraumatic.  Right Ear: External ear normal.  Left Ear: External ear normal.  Mouth/Throat: Oropharynx is clear and moist. No oropharyngeal exudate.  Nasal congestion right greater than left  Eyes: Conjunctivae and EOM are normal. Pupils are equal, round, and reactive to light. Right eye exhibits no discharge. Left eye  exhibits no discharge. No scleral icterus.  Neck: Normal range of motion. Neck supple. No thyromegaly present.  Cardiovascular: Normal rate, normal heart sounds and intact distal pulses.   No murmur heard. At 72-84/m and irregular irregular  Pulmonary/Chest: Effort normal. No respiratory distress. He has wheezes. He has no rales. He exhibits no tenderness.  Rhonchi and wheezes bilaterally  Abdominal: Soft. Bowel sounds are normal. He exhibits no mass. There is no tenderness. There is no rebound and no guarding.  Musculoskeletal: Normal range of motion. He exhibits no edema.  Lymphadenopathy:    He has no cervical adenopathy.  Neurological: He is alert and oriented to person, place, and time. He has normal reflexes. No cranial nerve deficit.  Skin: Skin is warm and dry. No rash noted.  Psychiatric: He has a normal mood and affect. His behavior is normal. Judgment and thought content normal.  Nursing note and vitals reviewed.  BP 139/83 mmHg  Pulse 88  Temp(Src) 97.1 F (36.2 C) (Oral)  Ht 5' 7" (1.702 m)  Wt 196 lb (88.905 kg)  BMI 30.69 kg/m2  [facility] reading (PRIMARY) by  Dr. Brunilda Payor x-ray-  previous bypass surgery and borderline cardiac enlargement with thoracic aortic atherosclerosis and otherwise no active disease                                      Assessment & Plan:  1. Atrial fibrillation, chronic ([place]) -The patient remains in atrial fibrillation and is rate controlled between 72 and 84/m. He will continue to follow-up with cardiology. - CBC with Differential/Platelet - POCT INR  2. Metabolic syndrome -He will continue with his diet for his cholesterol and try to exercise as much as possible - BMP8+EGFR - CBC with Differential/Platelet  3. Hyperlipidemia -Continue current treatment as we attempt to work on getting him started on the Chicken inhibitors treatment therapy -He will follow up with the clinical pharmacists on this. - CBC with Differential/Platelet -  NMR, lipoprofile  4. Essential hypertension -The blood pressure is good today and will continue with current treatment - BMP8+EGFR - CBC with Differential/Platelet - Hepatic function panel - DG Chest 2 View; Future  5. Vitamin D deficiency -Continue with vitamin D replacement pending results of lab work - CBC with Differential/Platelet - VITAMIN D 25 Hydroxy (Vit-D Deficiency, Fractures)  6. Gastroesophageal reflux disease, esophagitis presence not specified -Watch diet closely and avoid highly spiced foods - CBC with Differential/Platelet - Hepatic function panel  7. Congestion of respiratory tract -Take Mucinex and drink plenty of fluids - methylPREDNISolone acetate (DEPO-MEDROL) injection 60 mg; Inject 0.75 mLs (60 mg total) into the muscle once. - DG Chest 2 View; Future  8. Cough -Continue Mucinex and drink plenty of fluids - DG Chest 2 View; Future  9. Bronchitis with bronchospasm -Take antibiotic as directed and return to clinic in a couple weeks for recheck or sooner if necessary  10. Long term current use  of anticoagulant therapy -Continue with Coumadin treatment pending results of lab work  11. Atrial fibrillation, persistent ([place]) -Continue regular follow-up with cardiology  12. Thoracic aortic atherosclerosis  Meds ordered this encounter  Medications  . diltiazem (DILACOR XR) 120 MG 24 hr capsule    Sig: Take 120 mg by mouth daily.  . methylPREDNISolone acetate (DEPO-MEDROL) injection 60 mg    Sig:   . predniSONE (DELTASONE) 10 MG tablet    Sig: Take 1 tab QID x 2 days, then take 1 tab TID x 2 days, then take 1 tab BID x 2 days, then take 1 tab QD x 2 days.    Dispense:  20 tablet    Refill:  0  . clindamycin (CLEOCIN) 300 MG capsule    Sig: Take 1 capsule (300 mg total) by mouth 3 (three) times daily.    Dispense:  30 capsule    Refill:  0   Patient Instructions                       Medicare Annual Wellness Visit  [facility] and the medical  providers at [facility] strive to bring you the best medical care.  In doing so we not only want to address your current medical conditions and concerns but also to detect new conditions early and prevent illness, disease and health-related problems.    Medicare offers a yearly Wellness Visit which allows our clinical staff to assess your need for preventative services including immunizations, lifestyle education, counseling to decrease risk of preventable diseases and screening for fall risk and other medical concerns.    This visit is provided free of charge (no copay) for all Medicare recipients. The clinical pharmacists at [facility] have begun to conduct these Wellness Visits which will also include a thorough review of all your medications.    As you primary medical provider recommend that you make an appointment for your Annual Wellness Visit if you have not done so already this year.  You may set up this appointment before you leave today or you may call back ([phone]) and schedule an appointment.  Please make sure when you call that you mention that you are scheduling your Annual Wellness Visit with the clinical pharmacist so that the appointment may be made for the proper length of time.     Continue current medications. Continue good therapeutic lifestyle changes which include good diet and exercise. Fall precautions discussed with patient. If an FOBT was given today- please return it to our front desk. If you are over 36 years old - you may need Prevnar 80 or the adult Pneumonia vaccine.  **Flu shots are available--- please call and schedule a FLU-CLINIC appointment**  After your visit with [place] today you will receive a survey in the mail or online from [employer] regarding your care with [place]. Please take a moment to fill this out. Your feedback is very important to [place] as you can help [place] better understand your patient needs as well as  improve your experience and satisfaction. WE CARE ABOUT YOU!!!   The patient should continue with his Mucinex He should take the antibiotic as directed He should drink plenty of fluids He should continue with his nasal spray and Claritin He should take the prednisone as directed He should return to clinic in 10-14 days for recheck and if he gets worse he should return sooner Please return the FOBT   [facility]  Jennette Bill MD

## 2015-09-04 LAB — HEPATIC FUNCTION PANEL
ALK PHOS: 61 IU/L (ref 39–117)
ALT: 20 IU/L (ref 0–44)
AST: 17 IU/L (ref 0–40)
Albumin: 4.2 g/dL (ref 3.5–4.7)
BILIRUBIN TOTAL: 0.5 mg/dL (ref 0.0–1.2)
BILIRUBIN, DIRECT: 0.12 mg/dL (ref 0.00–0.40)
TOTAL PROTEIN: 6.8 g/dL (ref 6.0–8.5)

## 2015-09-04 LAB — CBC WITH DIFFERENTIAL/PLATELET
BASOS ABS: 0.1 10*3/uL (ref 0.0–0.2)
BASOS: 0 %
EOS (ABSOLUTE): 0.6 10*3/uL — ABNORMAL HIGH (ref 0.0–0.4)
Eos: 4 %
HEMOGLOBIN: 13.8 g/dL (ref 12.6–17.7)
Hematocrit: 41 % (ref 37.5–51.0)
IMMATURE GRANS (ABS): 0 10*3/uL (ref 0.0–0.1)
IMMATURE GRANULOCYTES: 0 %
LYMPHS: 35 %
Lymphocytes Absolute: 5.1 10*3/uL — ABNORMAL HIGH (ref 0.7–3.1)
MCH: 33.1 pg — AB (ref 26.6–33.0)
MCHC: 33.7 g/dL (ref 31.5–35.7)
MCV: 98 fL — ABNORMAL HIGH (ref 79–97)
MONOCYTES: 5 %
Monocytes Absolute: 0.7 10*3/uL (ref 0.1–0.9)
NEUTROS ABS: 8.1 10*3/uL — AB (ref 1.4–7.0)
NEUTROS PCT: 56 %
PLATELETS: 250 10*3/uL (ref 150–379)
RBC: 4.17 x10E6/uL (ref 4.14–5.80)
RDW: 13.5 % (ref 12.3–15.4)
WBC: 14.6 10*3/uL — ABNORMAL HIGH (ref 3.4–10.8)

## 2015-09-04 LAB — BMP8+EGFR
BUN/Creatinine Ratio: 15 (ref 10–22)
BUN: 13 mg/dL (ref 8–27)
CALCIUM: 9.3 mg/dL (ref 8.6–10.2)
CHLORIDE: 102 mmol/L (ref 97–106)
CO2: 27 mmol/L (ref 18–29)
Creatinine, Ser: 0.89 mg/dL (ref 0.76–1.27)
GFR, EST AFRICAN AMERICAN: 93 mL/min/{1.73_m2} (ref >59)
GFR, EST NON AFRICAN AMERICAN: 80 mL/min/{1.73_m2} (ref >59)
Glucose: 104 mg/dL — ABNORMAL HIGH (ref 65–99)
Potassium: 4.2 mmol/L (ref 3.5–5.2)
Sodium: 142 mmol/L (ref 136–144)

## 2015-09-04 LAB — NMR, LIPOPROFILE
CHOLESTEROL: 165 mg/dL (ref 100–199)
HDL Cholesterol by NMR: 48 mg/dL (ref >39)
HDL PARTICLE NUMBER: 24.1 umol/L — AB (ref >=30.5)
LDL PARTICLE NUMBER: 1227 nmol/L — AB (ref <1000)
LDL SIZE: 21.2 nm (ref >20.5)
LDL-C: 88 mg/dL (ref 0–99)
LP-IR SCORE: 26 (ref <=45)
SMALL LDL PARTICLE NUMBER: 463 nmol/L (ref <=527)
TRIGLYCERIDES BY NMR: 144 mg/dL (ref 0–149)

## 2015-09-04 LAB — VITAMIN D 25 HYDROXY (VIT D DEFICIENCY, FRACTURES): Vit D, 25-Hydroxy: 34.2 ng/mL (ref 30.0–100.0)

## 2015-09-09 ENCOUNTER — Ambulatory Visit (INDEPENDENT_AMBULATORY_CARE_PROVIDER_SITE_OTHER): Payer: Medicare Other | Admitting: Pharmacist

## 2015-09-09 DIAGNOSIS — I482 Chronic atrial fibrillation, unspecified: Secondary | ICD-10-CM

## 2015-09-09 DIAGNOSIS — Z7901 Long term (current) use of anticoagulants: Secondary | ICD-10-CM

## 2015-09-09 DIAGNOSIS — I481 Persistent atrial fibrillation: Secondary | ICD-10-CM

## 2015-09-09 DIAGNOSIS — I4819 Other persistent atrial fibrillation: Secondary | ICD-10-CM

## 2015-09-09 LAB — POCT INR: INR: 1.9

## 2015-09-09 NOTE — Patient Instructions (Signed)
Anticoagulation Dose Instructions as of 09/09/2015      [name] Tue Wed Thu Fri Sat   New Dose 5 mg 2.5 mg 5 mg 5 mg 5 mg 2.5 mg 5 mg    Description        Take only 1/2 tablet while taking prednisone.  After finished with prednisone and clindamycin start new dose of warfarin '5mg'$  - take 1/2 tablet on mondays and fridays and 1 tablet all other days.      INR today was 1.9

## 2015-09-10 LAB — SPECIMEN STATUS REPORT

## 2015-09-10 LAB — PSA: PROSTATE SPECIFIC AG, SERUM: 6.7 ng/mL — AB (ref 0.0–4.0)

## 2015-09-16 ENCOUNTER — Ambulatory Visit (INDEPENDENT_AMBULATORY_CARE_PROVIDER_SITE_OTHER): Payer: Medicare Other | Admitting: Family Medicine

## 2015-09-16 ENCOUNTER — Encounter: Payer: Self-pay | Admitting: Family Medicine

## 2015-09-16 VITALS — BP 121/74 | HR 88 | Temp 97.8°F | Ht 67.0 in | Wt 194.0 lb

## 2015-09-16 DIAGNOSIS — I482 Chronic atrial fibrillation, unspecified: Secondary | ICD-10-CM

## 2015-09-16 DIAGNOSIS — Z7901 Long term (current) use of anticoagulants: Secondary | ICD-10-CM | POA: Diagnosis not present

## 2015-09-16 DIAGNOSIS — I481 Persistent atrial fibrillation: Secondary | ICD-10-CM

## 2015-09-16 DIAGNOSIS — J209 Acute bronchitis, unspecified: Secondary | ICD-10-CM

## 2015-09-16 DIAGNOSIS — N400 Enlarged prostate without lower urinary tract symptoms: Secondary | ICD-10-CM

## 2015-09-16 DIAGNOSIS — I4819 Other persistent atrial fibrillation: Secondary | ICD-10-CM

## 2015-09-16 DIAGNOSIS — J40 Bronchitis, not specified as acute or chronic: Secondary | ICD-10-CM | POA: Diagnosis not present

## 2015-09-16 LAB — POCT INR: INR: 1.4

## 2015-09-16 NOTE — Progress Notes (Signed)
Subjective:    Patient ID: [name], male    DOB: Aug 04, 1932, 81 y.o.   MRN: [id number]  HPI Patient here today for 2 week follow up on cough. He is feeling better, but still has some slight congestion. The previous chest x-ray showed minimal bibasal submental atelectasis and the CBC had a white blood cell count was 14.6. The patient is feeling better still has a slight cough. The CBC and a white blood cell count were reviewed with him and this will be repeated as he is leaving today.     Patient Active Problem List   Diagnosis Date Noted  . Metabolic syndrome 51/70/0174  . Hematuria 01/22/2015  . Acute blood loss anemia 01/22/2015  . Urinary retention 01/21/2015  . UTI (lower urinary tract infection) 01/21/2015  . Cataract of both eyes 05/25/2014  . Long term current use of anticoagulant therapy 02/14/2013  . OVERWEIGHT 12/11/2009  . ANEMIA 02/25/2009  . HYPERTENSION 02/25/2009  . Coronary atherosclerosis 02/25/2009  . Atrial fibrillation, persistent ([place]) 02/25/2009  . GERD 02/25/2009  . DEGENERATIVE JOINT DISEASE 02/25/2009   Outpatient Encounter Prescriptions as of 09/16/2015  Medication Sig  . acetaminophen (TYLENOL) 650 MG CR tablet Take 650 mg by mouth every 8 (eight) hours as needed for pain.   . carvedilol (COREG) 12.5 MG tablet Take 0.5 tablets (6.25 mg total) by mouth 2 (two) times daily.  . cholecalciferol (VITAMIN D) 400 UNITS TABS tablet Take 1,000 Units by mouth.  . diltiazem (DILACOR XR) 120 MG 24 hr capsule Take 120 mg by mouth daily.  . DUREZOL 0.05 % EMUL   . finasteride (PROSCAR) 5 MG tablet TAKE ONE (1) TABLET EACH DAY  . fluticasone (FLONASE) 50 MCG/ACT nasal spray Place 2 sprays into both nostrils daily.  . furosemide (LASIX) 20 MG tablet TAKE ONE (1) TABLET EACH DAY  . loratadine (CLARITIN) 10 MG tablet Take 1 tablet (10 mg total) by mouth daily.  Marland Kitchen lovastatin (MEVACOR) 40 MG tablet TAKE ONE (1) TABLET EACH DAY  . warfarin (COUMADIN) 5 MG tablet  TAKE ONE (1) TABLET EACH DAY  . [DISCONTINUED] Alirocumab (PRALUENT) 75 MG/ML SOPN Inject 75 mg into the skin every 14 (fourteen) days.  . [DISCONTINUED] BESIVANCE 0.6 % SUSP   . [DISCONTINUED] clindamycin (CLEOCIN) 300 MG capsule Take 1 capsule (300 mg total) by mouth 3 (three) times daily.  . [DISCONTINUED] predniSONE (DELTASONE) 10 MG tablet Take 1 tab QID x 2 days, then take 1 tab TID x 2 days, then take 1 tab BID x 2 days, then take 1 tab QD x 2 days.  . [DISCONTINUED] PROLENSA 0.07 % SOLN    No facility-administered encounter medications on file as of 09/16/2015.      Review of Systems  Constitutional: Negative.   HENT: Positive for congestion.   Eyes: Negative.   Respiratory: Negative.   Cardiovascular: Negative.   Gastrointestinal: Negative.   Endocrine: Negative.   Genitourinary: Negative.   Musculoskeletal: Negative.   Skin: Negative.   Allergic/Immunologic: Negative.   Neurological: Negative.   Hematological: Negative.   Psychiatric/Behavioral: Negative.        Objective:   Physical Exam  Constitutional: He is oriented to person, place, and time. He appears well-developed and well-nourished. No distress.  HENT:  Head: Normocephalic and atraumatic.  Right Ear: External ear normal.  Left Ear: External ear normal.  Nose: Nose normal.  Mouth/Throat: Oropharynx is clear and moist. No oropharyngeal exudate.  Eyes: Conjunctivae and EOM are normal.  Pupils are equal, round, and reactive to light. Right eye exhibits no discharge. Left eye exhibits no discharge. No scleral icterus.  Neck: Normal range of motion. Neck supple. No thyromegaly present.  Cardiovascular: Normal rate and normal heart sounds.   No murmur heard. The rhythm is irregular irregular at about 96/m  Pulmonary/Chest: Effort normal and breath sounds normal. No respiratory distress. He has no wheezes. He has no rales. He exhibits no tenderness.  The chest is clear today.  Abdominal: Soft. Bowel sounds are  normal. He exhibits no mass. There is no tenderness. There is no rebound and no guarding.  Musculoskeletal: Normal range of motion. He exhibits no edema.  Lymphadenopathy:    He has no cervical adenopathy.  Neurological: He is alert and oriented to person, place, and time.  Skin: Skin is warm and dry. No rash noted. No erythema.  Psychiatric: He has a normal mood and affect. His behavior is normal. Judgment and thought content normal.  Nursing note and vitals reviewed.  BP 121/74 mmHg  Pulse 88  Temp(Src) 97.8 F (36.6 C) (Oral)  Ht '5\' 7"'$  (1.702 m)  Wt 194 lb (87.998 kg)  BMI 30.38 kg/m2        Assessment & Plan:  1. Atrial fibrillation, chronic ([place]) -The patient should continue with his Coumadin as discussed by the clinical pharmacists. -He should continue with follow-up by the cardiologist - POCT INR  2. Long term current use of anticoagulant therapy -Continue Coumadin for atrial fibrillation  3. Atrial fibrillation, persistent ([place]) -10 you follow-up with cardiology  4. Bronchitis with bronchospasm -This is improved and the patient will continue with his Mucinex for at least one more month.  5. [name] (benign prostatic hyperplasia) -He should follow-up with the urologist as planned and have a frank discussion with the urologist regarding his [name] and his concerns about prostate cancer  Patient Instructions   Anticoagulation Dose Instructions as of 09/16/2015      Sun Mon Tue Wed Thu Fri Sat   New Dose 5 mg 2.5 mg 5 mg 5 mg 5 mg 2.5 mg 5 mg    Description        Take 1 tablet instead of 1/2 today - Monday, December 5th, then start new dose of warfarin '5mg'$  - take 1/2 tablet on mondays and fridays and 1 tablet all other days.     INR was 1.5 today  The patient will continue with his Mucinex maximum strength 1 twice daily for at least 3-4 more weeks He will continue to drink plenty of fluids and stay well hydrated He will continue his nasal saline He will keep his  appointment with the urologist as planned   Arrie Senate MD

## 2015-09-16 NOTE — Patient Instructions (Addendum)
Anticoagulation Dose Instructions as of 09/16/2015      [name] Tue Wed Thu Fri Sat   New Dose 5 mg 2.5 mg 5 mg 5 mg 5 mg 2.5 mg 5 mg    Description        Take 1 tablet instead of 1/2 today - Monday, December 5th, then start new dose of warfarin '5mg'$  - take 1/2 tablet on mondays and fridays and 1 tablet all other days.     INR was 1.5 today  The patient will continue with his Mucinex maximum strength 1 twice daily for at least 3-4 more weeks He will continue to drink plenty of fluids and stay well hydrated He will continue his nasal saline He will keep his appointment with the urologist as planned

## 2015-09-17 DIAGNOSIS — H16143 Punctate keratitis, bilateral: Secondary | ICD-10-CM | POA: Diagnosis not present

## 2015-09-17 DIAGNOSIS — H02721 Madarosis of right upper eyelid and periocular area: Secondary | ICD-10-CM | POA: Diagnosis not present

## 2015-09-17 DIAGNOSIS — H25811 Combined forms of age-related cataract, right eye: Secondary | ICD-10-CM | POA: Diagnosis not present

## 2015-09-17 DIAGNOSIS — H02401 Unspecified ptosis of right eyelid: Secondary | ICD-10-CM | POA: Diagnosis not present

## 2015-09-20 ENCOUNTER — Ambulatory Visit: Payer: Medicare Other | Admitting: Urology

## 2015-09-23 DIAGNOSIS — M1612 Unilateral primary osteoarthritis, left hip: Secondary | ICD-10-CM | POA: Diagnosis not present

## 2015-09-23 DIAGNOSIS — M25552 Pain in left hip: Secondary | ICD-10-CM | POA: Diagnosis not present

## 2015-09-24 ENCOUNTER — Other Ambulatory Visit: Payer: Self-pay | Admitting: Family Medicine

## 2015-09-30 ENCOUNTER — Ambulatory Visit (INDEPENDENT_AMBULATORY_CARE_PROVIDER_SITE_OTHER): Payer: Medicare Other | Admitting: Pharmacist

## 2015-09-30 DIAGNOSIS — Z7901 Long term (current) use of anticoagulants: Secondary | ICD-10-CM

## 2015-09-30 DIAGNOSIS — I4819 Other persistent atrial fibrillation: Secondary | ICD-10-CM

## 2015-09-30 DIAGNOSIS — I481 Persistent atrial fibrillation: Secondary | ICD-10-CM

## 2015-09-30 DIAGNOSIS — I482 Chronic atrial fibrillation, unspecified: Secondary | ICD-10-CM

## 2015-09-30 LAB — POCT INR: INR: 1.7

## 2015-09-30 NOTE — Patient Instructions (Signed)
Anticoagulation Dose Instructions as of 09/30/2015      Dorene Grebe Tue Wed Thu Fri Sat   New Dose 5 mg 5 mg 5 mg 5 mg 5 mg 2.5 mg 5 mg    Description        Increase dose of warfarin '5mg'$  - take 1/2 tablet on fridays and 1 tablet all other days.     INR was 1.7 today

## 2015-10-03 DIAGNOSIS — M1612 Unilateral primary osteoarthritis, left hip: Secondary | ICD-10-CM | POA: Diagnosis not present

## 2015-10-15 ENCOUNTER — Encounter: Payer: Self-pay | Admitting: Pharmacist Clinician (PhC)/ Clinical Pharmacy Specialist

## 2015-10-24 ENCOUNTER — Ambulatory Visit (INDEPENDENT_AMBULATORY_CARE_PROVIDER_SITE_OTHER): Payer: Medicare Other | Admitting: Pharmacist

## 2015-10-24 DIAGNOSIS — I4819 Other persistent atrial fibrillation: Secondary | ICD-10-CM

## 2015-10-24 DIAGNOSIS — Z7901 Long term (current) use of anticoagulants: Secondary | ICD-10-CM

## 2015-10-24 DIAGNOSIS — I482 Chronic atrial fibrillation, unspecified: Secondary | ICD-10-CM

## 2015-10-24 LAB — POCT INR: INR: 4.5

## 2015-10-24 MED ORDER — DILTIAZEM HCL ER 120 MG PO CP24: 120.0000 mg | ORAL_CAPSULE | Freq: Every day | ORAL | Status: DC

## 2015-10-24 NOTE — Patient Instructions (Signed)
Anticoagulation Dose Instructions as of 10/24/2015      Dorene Grebe Tue Wed Thu Fri Sat   New Dose 5 mg 2.5 mg 5 mg 5 mg 5 mg 2.5 mg 5 mg    Description        No warfarin today - Thursday, January 12th and Friday, January 13th.  Then decrease warfarin dose to 1/2 tablet on Mondays and Fridays and 1 tablet all other days.     INR was 4.5 today

## 2015-11-14 ENCOUNTER — Ambulatory Visit (INDEPENDENT_AMBULATORY_CARE_PROVIDER_SITE_OTHER): Payer: Medicare Other | Admitting: Pharmacist

## 2015-11-14 DIAGNOSIS — I482 Chronic atrial fibrillation, unspecified: Secondary | ICD-10-CM

## 2015-11-14 DIAGNOSIS — I4819 Other persistent atrial fibrillation: Secondary | ICD-10-CM

## 2015-11-14 DIAGNOSIS — Z7901 Long term (current) use of anticoagulants: Secondary | ICD-10-CM

## 2015-11-14 LAB — POCT INR: INR: 2.9

## 2015-11-14 NOTE — Patient Instructions (Signed)
Anticoagulation Dose Instructions as of 11/14/2015      [name] Tue Wed Thu Fri Sat   New Dose 5 mg 2.5 mg 5 mg 5 mg 5 mg 2.5 mg 5 mg    Description        Continue current warfarin dose of 1/2 tablet on Mondays and Fridays and 1 tablet all other days.     INR was 2.9 today

## 2015-12-12 ENCOUNTER — Ambulatory Visit (INDEPENDENT_AMBULATORY_CARE_PROVIDER_SITE_OTHER): Payer: Medicare Other | Admitting: Pharmacist

## 2015-12-12 DIAGNOSIS — I481 Persistent atrial fibrillation: Secondary | ICD-10-CM | POA: Diagnosis not present

## 2015-12-12 DIAGNOSIS — I482 Chronic atrial fibrillation, unspecified: Secondary | ICD-10-CM

## 2015-12-12 DIAGNOSIS — Z7901 Long term (current) use of anticoagulants: Secondary | ICD-10-CM

## 2015-12-12 DIAGNOSIS — I4819 Other persistent atrial fibrillation: Secondary | ICD-10-CM

## 2015-12-12 LAB — POCT INR

## 2015-12-12 NOTE — Patient Instructions (Signed)
Anticoagulation Dose Instructions as of 12/12/2015      [name] Tue Wed Thu Fri Sat   New Dose 5 mg 2.5 mg 5 mg 5 mg 5 mg 2.5 mg 5 mg    Description        Take 1 and 1/2 tablets today - Thursday, March 2nd.  Then continue current warfarin dose of 1/2 tablet on Mondays and Fridays and 1 tablet all other days.     INR was 1.6 today

## 2016-01-06 ENCOUNTER — Other Ambulatory Visit: Payer: Self-pay | Admitting: Family Medicine

## 2016-01-22 ENCOUNTER — Ambulatory Visit: Payer: Medicare Other | Admitting: Family Medicine

## 2016-02-12 ENCOUNTER — Encounter: Payer: Self-pay | Admitting: Family Medicine

## 2016-02-12 ENCOUNTER — Encounter (INDEPENDENT_AMBULATORY_CARE_PROVIDER_SITE_OTHER): Payer: Self-pay

## 2016-02-12 ENCOUNTER — Ambulatory Visit (INDEPENDENT_AMBULATORY_CARE_PROVIDER_SITE_OTHER): Payer: Medicare Other | Admitting: Family Medicine

## 2016-02-12 VITALS — BP 146/92 | HR 82 | Temp 97.3°F | Ht 67.0 in | Wt 193.0 lb

## 2016-02-12 DIAGNOSIS — E785 Hyperlipidemia, unspecified: Secondary | ICD-10-CM

## 2016-02-12 DIAGNOSIS — Z1211 Encounter for screening for malignant neoplasm of colon: Secondary | ICD-10-CM | POA: Diagnosis not present

## 2016-02-12 DIAGNOSIS — Z96651 Presence of right artificial knee joint: Secondary | ICD-10-CM | POA: Diagnosis not present

## 2016-02-12 DIAGNOSIS — Z96641 Presence of right artificial hip joint: Secondary | ICD-10-CM | POA: Diagnosis not present

## 2016-02-12 DIAGNOSIS — I481 Persistent atrial fibrillation: Secondary | ICD-10-CM

## 2016-02-12 DIAGNOSIS — E8881 Metabolic syndrome: Secondary | ICD-10-CM | POA: Diagnosis not present

## 2016-02-12 DIAGNOSIS — M25551 Pain in right hip: Secondary | ICD-10-CM

## 2016-02-12 DIAGNOSIS — I10 Essential (primary) hypertension: Secondary | ICD-10-CM

## 2016-02-12 DIAGNOSIS — I4819 Other persistent atrial fibrillation: Secondary | ICD-10-CM

## 2016-02-12 DIAGNOSIS — K219 Gastro-esophageal reflux disease without esophagitis: Secondary | ICD-10-CM

## 2016-02-12 DIAGNOSIS — M25561 Pain in right knee: Secondary | ICD-10-CM

## 2016-02-12 DIAGNOSIS — E559 Vitamin D deficiency, unspecified: Secondary | ICD-10-CM | POA: Diagnosis not present

## 2016-02-12 DIAGNOSIS — N400 Enlarged prostate without lower urinary tract symptoms: Secondary | ICD-10-CM | POA: Diagnosis not present

## 2016-02-12 DIAGNOSIS — D649 Anemia, unspecified: Secondary | ICD-10-CM

## 2016-02-12 LAB — COAGUCHEK XS/INR WAIVED
INR: 2.6 — ABNORMAL HIGH (ref 0.9–1.1)
PROTHROMBIN TIME: 31.7 s

## 2016-02-12 NOTE — Patient Instructions (Addendum)
Medicare Annual Wellness Visit  [facility] and the medical providers at [facility] strive to bring you the best medical care.  In doing so we not only want to address your current medical conditions and concerns but also to detect new conditions early and prevent illness, disease and health-related problems.    Medicare offers a yearly Wellness Visit which allows our clinical staff to assess your need for preventative services including immunizations, lifestyle education, counseling to decrease risk of preventable diseases and screening for fall risk and other medical concerns.    This visit is provided free of charge (no copay) for all Medicare recipients. The clinical pharmacists at [facility] have begun to conduct these Wellness Visits which will also include a thorough review of all your medications.    As you primary medical provider recommend that you make an appointment for your Annual Wellness Visit if you have not done so already this year.  You may set up this appointment before you leave today or you may call back ([phone]) and schedule an appointment.  Please make sure when you call that you mention that you are scheduling your Annual Wellness Visit with the clinical pharmacist so that the appointment may be made for the proper length of time.     Continue current medications. Continue good therapeutic lifestyle changes which include good diet and exercise. Fall precautions discussed with patient. If an FOBT was given today- please return it to our front desk. If you are over 60 years old - you may need Prevnar 38 or the adult Pneumonia vaccine.  **Flu shots are available--- please call and schedule a FLU-CLINIC appointment**  After your visit with [place] today you will receive a survey in the mail or online from [employer] regarding your care with [place]. Please take a moment to fill this out. Your feedback is very  important to [place] as you can help [place] better understand your patient needs as well as improve your experience and satisfaction. WE CARE ABOUT YOU!!!   The patient will continue to follow-up with his urologist and cardiologist He has an appointment with his ophthalmologist soon he will make sure that we get a copy of the report sent to our office He should also discuss with his orthopedist when he gets his next hip injection about the possibility of future hip replacement. He should hold the statin drug for 4 weeks to see if the muscle aches and myalgias improve and he will call us back at that time with the results of holding the statin drug.

## 2016-02-12 NOTE — Progress Notes (Signed)
Subjective:    Patient ID: [name], male    DOB: December 12, 1931, 82 y.o.   MRN: [id number]  HPI Pt here for follow up and management of chronic medical problems which inlcudes hypertension, a fib, hyperlipidemia, and GERD. He is taking medications regularly.The patient's biggest complaint today is some myalgias generally all over his body. He is due to get lab work today and will return an FOBT. The muscle aches and myalgias are all over his body legs and arms and the patient is convinced that is coming from his statin drug. He sees a cardiologist yearly and he sees the urologist every 6 months. He denies any chest pain chest tightness or shortness of breath anymore than usual. The food is going down without any swallowing issues nausea vomiting diarrhea or blood in the stool or black tarry bowel movements. He is passing his water well although it is slow but he has not seen any blood in the urine. He goes to get his eyes checked in the next couple weeks. He will have the ophthalmologist send [place] a copy of that report. We will have him leave off the statin drug for one month and call us after 1 month regarding the muscle aches and myalgias at that time.     Patient Active Problem List   Diagnosis Date Noted  . Metabolic syndrome 13/24/4010  . Hematuria 01/22/2015  . Acute blood loss anemia 01/22/2015  . Urinary retention 01/21/2015  . UTI (lower urinary tract infection) 01/21/2015  . Cataract of both eyes 05/25/2014  . Long term current use of anticoagulant therapy 02/14/2013  . OVERWEIGHT 12/11/2009  . ANEMIA 02/25/2009  . HYPERTENSION 02/25/2009  . Coronary atherosclerosis 02/25/2009  . Atrial fibrillation, persistent ([place]) 02/25/2009  . GERD 02/25/2009  . DEGENERATIVE JOINT DISEASE 02/25/2009   Outpatient Encounter Prescriptions as of 02/12/2016  Medication Sig  . acetaminophen (TYLENOL) 650 MG CR tablet Take 650 mg by mouth every 8 (eight) hours as needed for pain.   . carvedilol  (COREG) 12.5 MG tablet Take 0.5 tablets (6.25 mg total) by mouth 2 (two) times daily.  . cholecalciferol (VITAMIN D) 400 UNITS TABS tablet Take 1,000 Units by mouth.  . diltiazem (DILACOR XR) 120 MG 24 hr capsule Take 1 capsule (120 mg total) by mouth daily. Reported on 10/24/2015  . finasteride (PROSCAR) 5 MG tablet TAKE ONE (1) TABLET EACH DAY  . fluticasone (FLONASE) 50 MCG/ACT nasal spray Place 2 sprays into both nostrils daily.  . furosemide (LASIX) 20 MG tablet TAKE ONE (1) TABLET EACH DAY  . loratadine (CLARITIN) 10 MG tablet Take 1 tablet (10 mg total) by mouth daily.  Marland Kitchen lovastatin (MEVACOR) 40 MG tablet TAKE ONE (1) TABLET EACH DAY  . Polyethyl Glycol-Propyl Glycol (SYSTANE) 0.4-0.3 % GEL ophthalmic gel Place 1 application into both eyes.  Marland Kitchen warfarin (COUMADIN) 5 MG tablet TAKE ONE (1) TABLET EACH DAY   No facility-administered encounter medications on file as of 02/12/2016.      Review of Systems  Constitutional: Negative.   HENT: Negative.   Eyes: Negative.   Respiratory: Negative.   Cardiovascular: Negative.   Gastrointestinal: Negative.   Endocrine: Negative.   Genitourinary: Negative.   Musculoskeletal: Positive for myalgias and arthralgias.  Skin: Negative.   Allergic/Immunologic: Negative.   Neurological: Negative.   Hematological: Negative.   Psychiatric/Behavioral: Negative.        Objective:   Physical Exam  Constitutional: He is oriented to person, place, and time. He  appears well-developed and well-nourished. No distress.  HENT:  Head: Normocephalic and atraumatic.  Right Ear: External ear normal.  Left Ear: External ear normal.  Nose: Nose normal.  Mouth/Throat: Oropharynx is clear and moist. No oropharyngeal exudate.  Eyes: Conjunctivae and EOM are normal. Pupils are equal, round, and reactive to light. Right eye exhibits no discharge. Left eye exhibits no discharge. No scleral icterus.  Neck: Normal range of motion. Neck supple. No thyromegaly present.    There are no bruits or adenopathy or thyromegaly  Cardiovascular: Normal rate, normal heart sounds and intact distal pulses.   No murmur heard. The rhythm is irregular irregular at 72/m  Pulmonary/Chest: Effort normal and breath sounds normal. No respiratory distress. He has no wheezes. He has no rales. He exhibits no tenderness.  Clear anteriorly and posteriorly without axillary adenopathy  Abdominal: Soft. Bowel sounds are normal. He exhibits no mass. There is no tenderness. There is no rebound and no guarding.  No abdominal tenderness or organ enlargement or bruits  Genitourinary:  The patient sees the urologist every 6 months  Musculoskeletal: He exhibits tenderness. He exhibits no edema.  There is tenderness over the left lateral hip area. This is worse with flexion of hip. Because of the persistent hip pain and mobility issues we will write him a prescription for a lift chair.  Lymphadenopathy:    He has no cervical adenopathy.  Neurological: He is alert and oriented to person, place, and time. He has normal reflexes. No cranial nerve deficit.  Skin: Skin is warm and dry. No rash noted.  Psychiatric: He has a normal mood and affect. His behavior is normal. Judgment and thought content normal.  Nursing note and vitals reviewed.  BP 146/92 mmHg  Pulse 82  Temp(Src) 97.3 F (36.3 C) (Oral)  Ht _0  (1.702 m)  Wt 193 lb (87.544 kg)  BMI 30.22 kg/m2        Assessment & Plan:  1. Atrial fibrillation, persistent ([place]) -The patient denies any chest pain or shortness of breath. He sees a cardiologist on a yearly basis and has an upcoming appointment soon. - CBC with Differential/Platelet  2. [name] (benign prostatic hyperplasia) -He will continue to follow-up with urology. - CBC with Differential/Platelet - PSA, total and free  3. Hyperlipidemia -Continue with aggressive therapeutic lifestyle changes. He is having some muscle aches and myalgias and we've asked him to hold his  lovastatin for 4 weeks and call us at that time regarding his muscle aches and myalgias. - CBC with Differential/Platelet - NMR, lipoprofile  4. Essential hypertension -The blood pressure is slightly elevated today and we will continue to monitor this and he will bring his readings for review in the future. - BMP8+EGFR - CBC with Differential/Platelet - Hepatic function panel  5. Metabolic syndrome -Continue to work on aggressive therapeutic lifestyle changes - BMP8+EGFR - CBC with Differential/Platelet  6. Vitamin D deficiency -Continue current treatment pending results of lab work - CBC with Differential/Platelet - VITAMIN D 25 Hydroxy (Vit-D Deficiency, Fractures)  7. Gastroesophageal reflux disease, esophagitis presence not specified -The patient has no complaints with this today but he will continue with watching his diet closely as far as fried foods etc. - CBC with Differential/Platelet  8. Special screening for malignant neoplasms, colon - Fecal occult blood, imunochemical; Future  9. Hx of total knee replacement, right - DME Other see comment  10. History of right hip replacement - DME Other see comment  11. Right hip pain  See orthopedist soon regarding an injection.  DME Other see comment - Sedimentation rate - CK  12. Right knee pain - DME Other see comment - Sedimentation rate - CK  13. ANEMIA  Patient Instructions                       Medicare Annual Wellness Visit  [facility] and the medical providers at [facility] strive to bring you the best medical care.  In doing so we not only want to address your current medical conditions and concerns but also to detect new conditions early and prevent illness, disease and health-related problems.    Medicare offers a yearly Wellness Visit which allows our clinical staff to assess your need for preventative services including immunizations, lifestyle education, counseling to decrease  risk of preventable diseases and screening for fall risk and other medical concerns.    This visit is provided free of charge (no copay) for all Medicare recipients. The clinical pharmacists at [facility] have begun to conduct these Wellness Visits which will also include a thorough review of all your medications.    As you primary medical provider recommend that you make an appointment for your Annual Wellness Visit if you have not done so already this year.  You may set up this appointment before you leave today or you may call back ([phone]) and schedule an appointment.  Please make sure when you call that you mention that you are scheduling your Annual Wellness Visit with the clinical pharmacist so that the appointment may be made for the proper length of time.     Continue current medications. Continue good therapeutic lifestyle changes which include good diet and exercise. Fall precautions discussed with patient. If an FOBT was given today- please return it to our front desk. If you are over 19 years old - you may need Prevnar 68 or the adult Pneumonia vaccine.  **Flu shots are available--- please call and schedule a FLU-CLINIC appointment**  After your visit with [place] today you will receive a survey in the mail or online from [employer] regarding your care with [place]. Please take a moment to fill this out. Your feedback is very important to [place] as you can help [place] better understand your patient needs as well as improve your experience and satisfaction. WE CARE ABOUT YOU!!!   The patient will continue to follow-up with his urologist and cardiologist He has an appointment with his ophthalmologist soon he will make sure that we get a copy of the report sent to our office He should also discuss with his orthopedist when he gets his next hip injection about the possibility of future hip replacement. He should hold the statin drug for 4 weeks to see if the muscle aches and  myalgias improve and he will call us back at that time with the results of holding the statin drug.   Arrie Senate MD

## 2016-02-12 NOTE — Addendum Note (Signed)
Addended by: Zannie Cove on: 02/12/2016 03:03 PM   Modules accepted: Orders

## 2016-02-13 LAB — BMP8+EGFR
BUN / CREAT RATIO: 16 (ref 10–24)
BUN: 15 mg/dL (ref 8–27)
CALCIUM: 9.2 mg/dL (ref 8.6–10.2)
CHLORIDE: 103 mmol/L (ref 96–106)
CO2: 24 mmol/L (ref 18–29)
Creatinine, Ser: 0.94 mg/dL (ref 0.76–1.27)
GFR calc non Af Amer: 76 mL/min/{1.73_m2} (ref >59)
GFR, EST AFRICAN AMERICAN: 88 mL/min/{1.73_m2} (ref >59)
Glucose: 105 mg/dL — ABNORMAL HIGH (ref 65–99)
POTASSIUM: 4.3 mmol/L (ref 3.5–5.2)
SODIUM: 141 mmol/L (ref 134–144)

## 2016-02-13 LAB — CBC WITH DIFFERENTIAL/PLATELET
BASOS ABS: 0 10*3/uL (ref 0.0–0.2)
BASOS: 0 %
EOS (ABSOLUTE): 0.7 10*3/uL — AB (ref 0.0–0.4)
Eos: 6 %
Hematocrit: 43.3 % (ref 37.5–51.0)
Hemoglobin: 14.2 g/dL (ref 12.6–17.7)
Immature Grans (Abs): 0 10*3/uL (ref 0.0–0.1)
Immature Granulocytes: 0 %
LYMPHS ABS: 5.3 10*3/uL — AB (ref 0.7–3.1)
Lymphs: 46 %
MCH: 32.4 pg (ref 26.6–33.0)
MCHC: 32.8 g/dL (ref 31.5–35.7)
MCV: 99 fL — AB (ref 79–97)
Monocytes Absolute: 0.7 10*3/uL (ref 0.1–0.9)
Monocytes: 6 %
NEUTROS ABS: 5 10*3/uL (ref 1.4–7.0)
Neutrophils: 42 %
PLATELETS: 236 10*3/uL (ref 150–379)
RBC: 4.38 x10E6/uL (ref 4.14–5.80)
RDW: 13.5 % (ref 12.3–15.4)
WBC: 11.7 10*3/uL — ABNORMAL HIGH (ref 3.4–10.8)

## 2016-02-13 LAB — NMR, LIPOPROFILE
Cholesterol: 197 mg/dL (ref 100–199)
HDL Cholesterol by NMR: 51 mg/dL (ref >39)
HDL PARTICLE NUMBER: 24 umol/L — AB (ref >=30.5)
LDL PARTICLE NUMBER: 1491 nmol/L — AB (ref <1000)
LDL SIZE: 21.6 nm (ref >20.5)
LDL-C: 120 mg/dL — AB (ref 0–99)
LP-IR Score: 26 (ref <=45)
SMALL LDL PARTICLE NUMBER: 530 nmol/L — AB (ref <=527)
TRIGLYCERIDES BY NMR: 128 mg/dL (ref 0–149)

## 2016-02-13 LAB — HEPATIC FUNCTION PANEL
ALK PHOS: 60 IU/L (ref 39–117)
ALT: 14 IU/L (ref 0–44)
AST: 15 IU/L (ref 0–40)
Albumin: 4.4 g/dL (ref 3.5–4.7)
Bilirubin Total: 0.4 mg/dL (ref 0.0–1.2)
Bilirubin, Direct: 0.12 mg/dL (ref 0.00–0.40)
TOTAL PROTEIN: 6.7 g/dL (ref 6.0–8.5)

## 2016-02-13 LAB — SEDIMENTATION RATE: Sed Rate: 5 mm/hr (ref 0–30)

## 2016-02-13 LAB — PSA, TOTAL AND FREE
PSA, Free Pct: 15.1 %
PSA, Free: 1.07 ng/mL
Prostate Specific Ag, Serum: 7.1 ng/mL — ABNORMAL HIGH (ref 0.0–4.0)

## 2016-02-13 LAB — VITAMIN D 25 HYDROXY (VIT D DEFICIENCY, FRACTURES): Vit D, 25-Hydroxy: 34.2 ng/mL (ref 30.0–100.0)

## 2016-02-13 LAB — CK: Total CK: 85 U/L (ref 24–204)

## 2016-02-14 ENCOUNTER — Other Ambulatory Visit: Payer: Medicare Other

## 2016-02-14 DIAGNOSIS — Z1211 Encounter for screening for malignant neoplasm of colon: Secondary | ICD-10-CM

## 2016-02-16 LAB — FECAL OCCULT BLOOD, IMMUNOCHEMICAL: FECAL OCCULT BLD: NEGATIVE

## 2016-03-04 ENCOUNTER — Ambulatory Visit (INDEPENDENT_AMBULATORY_CARE_PROVIDER_SITE_OTHER): Payer: Medicare Other | Admitting: Cardiology

## 2016-03-04 ENCOUNTER — Encounter: Payer: Self-pay | Admitting: Cardiology

## 2016-03-04 VITALS — BP 153/93 | HR 86 | Ht 67.0 in | Wt 192.0 lb

## 2016-03-04 DIAGNOSIS — I10 Essential (primary) hypertension: Secondary | ICD-10-CM

## 2016-03-04 NOTE — Patient Instructions (Signed)

## 2016-03-04 NOTE — Progress Notes (Signed)
HPI The patient presents for followup of his coronary disease and atrial fibrillation.  Since I last saw him he has done well.  The patient denies any new symptoms such as chest discomfort, neck or arm discomfort. There has been no new shortness of breath, PND or orthopnea. There have been no reported palpitations, presyncope or syncope.  He is still walking to the mailbox.  He is limited by hip pain.  The patient denies any new symptoms such as chest discomfort, neck or arm discomfort. There has been no new shortness of breath, PND or orthopnea. There have been no reported palpitations, presyncope or syncope.   He has had no further problems with his prostate or urinary retention which was a problem previously.   Allergies  Allergen Reactions  . Penicillins Hives and Rash  . Hct [Hydrochlorothiazide] Other (See Comments)    hyper  . Liptruzet [Ezetimibe-Atorvastatin] Other (See Comments)    myalgia  . Statins Other (See Comments)    myalgia    Current Outpatient Prescriptions  Medication Sig Dispense Refill  . acetaminophen (TYLENOL) 650 MG CR tablet Take 650 mg by mouth every 8 (eight) hours as needed for pain.     . carvedilol (COREG) 12.5 MG tablet Take 0.5 tablets (6.25 mg total) by mouth 2 (two) times daily. 60 tablet 2  . cholecalciferol (VITAMIN D) 400 UNITS TABS tablet Take 1,000 Units by mouth.    . diltiazem (DILACOR XR) 120 MG 24 hr capsule Take 1 capsule (120 mg total) by mouth daily. Reported on 10/24/2015 30 capsule 5  . finasteride (PROSCAR) 5 MG tablet TAKE ONE (1) TABLET EACH DAY 30 tablet 5  . furosemide (LASIX) 20 MG tablet TAKE ONE (1) TABLET EACH DAY 30 tablet 1  . lovastatin (MEVACOR) 40 MG tablet TAKE ONE (1) TABLET EACH DAY 30 tablet 5  . Polyethyl Glycol-Propyl Glycol (SYSTANE) 0.4-0.3 % GEL ophthalmic gel Place 1 application into both eyes as needed.     . warfarin (COUMADIN) 5 MG tablet TAKE ONE (1) TABLET EACH DAY 30 tablet 1   No current  facility-administered medications for this visit.    Past Medical History  Diagnosis Date  . Hypertension   . Persistent atrial fibrillation ([place])   . Coronary artery disease     5 bypasses  . Ejection fraction < 50%     Mildly reduced, 40% by echo  . Anemia   . Cellulitis     In past  . DJD (degenerative joint disease)   . GERD (gastroesophageal reflux disease)   . PUD (peptic ulcer disease)   . Skin cancer of eyelid     Resected  . Atrial fibrillation ([place])   . Prostate hypertrophy     on CT scan 09/2014  . Cataract   . Hyperlipidemia     Past Surgical History  Procedure Laterality Date  . Coronary artery bypass graft  4/05    LIMA to LAD, SVG to PDA, SVG to ramus intermediate  . Heart bypass  2005  . Total knee arthroplasty      Right  . Appendectomy    . Eyelid carcinoma excision      Skin cancer resected  . Total hip arthroplasty      Right  . Bypass graft  2005  . Cataract extraction w/phaco Left 07/22/2015    Procedure: CATARACT EXTRACTION PHACO AND INTRAOCULAR LENS PLACEMENT LEFT EYE CDE=8.14;  Surgeon: Tonny Branch, MD;  Location: [facility];  Service: Ophthalmology;  Laterality: Left;  Marland Kitchen Eye surgery  07/2015     ROS:  As stated in the HPI and negative for all other systems.  PHYSICAL EXAM BP 153/93 mmHg  Pulse 86  Ht '5\' 7"'$  (1.702 m)  Wt 192 lb (87.091 kg)  BMI 30.06 kg/m2 GENERAL:  Well appearing NECK:  No jugular venous distention, waveform within normal limits, carotid upstroke brisk and symmetric, no bruits, no thyromegaly, irregular LUNGS:  Clear to auscultation bilaterally BACK:  No CVA tenderness CHEST:  Well healed sternotomy scar. HEART:  PMI not displaced or sustained,S1 and S2 within normal limits, no S3, no clicks, no rubs, no murmurs, irregular ABD:  Flat, positive bowel sounds normal in frequency in pitch, no bruits, no rebound, no guarding, no midline pulsatile mass, no hepatomegaly, no splenomegaly, obese EXT:  2 plus pulses throughout,  trace edema, no cyanosis no clubbing  EKG:  Atrial fibrillation, rate this 80, early transition in lead V2, nonspecific lateral ST flattening.  PVCs    03/04/2016  ASSESSMENT AND PLAN  [name]:  The patient has no new sypmtoms.  I would screen him with a treadmill but he could not walk on this and I don't think he needs a nuclear test so we will continue with risk reductions unless he has symptoms in the future.   HTN:  The blood pressure is elevated but this is unusual . No change in medications is indicated. We will continue with therapeutic lifestyle changes (TLC).  HYPERLIPIDEMIA:  This is followed closely by Dr. Laurance Flatten.  The patient he is intolerant of higher dose statins.  He is not quite at target.  I will defer to Dr. Laurance Flatten to consider Zetia Lab Results  Component Value Date   CHOL 197 02/12/2016   TRIG 128 02/12/2016   HDL 51 02/12/2016   LDLCALC 209 04/09/2015    ATRIAL FIBRILLATION:  The patient  tolerates this rhythm and rate control and anticoagulation. We will continue with the meds as listed.   He is not interested in switching to another agent.

## 2016-03-07 ENCOUNTER — Other Ambulatory Visit: Payer: Self-pay | Admitting: Family Medicine

## 2016-03-10 ENCOUNTER — Ambulatory Visit (INDEPENDENT_AMBULATORY_CARE_PROVIDER_SITE_OTHER): Payer: Medicare Other | Admitting: Pharmacist Clinician (PhC)/ Clinical Pharmacy Specialist

## 2016-03-10 DIAGNOSIS — I482 Chronic atrial fibrillation, unspecified: Secondary | ICD-10-CM

## 2016-03-10 DIAGNOSIS — I481 Persistent atrial fibrillation: Secondary | ICD-10-CM | POA: Diagnosis not present

## 2016-03-10 DIAGNOSIS — I4819 Other persistent atrial fibrillation: Secondary | ICD-10-CM

## 2016-03-10 DIAGNOSIS — Z7901 Long term (current) use of anticoagulants: Secondary | ICD-10-CM | POA: Diagnosis not present

## 2016-03-10 LAB — COAGUCHEK XS/INR WAIVED
INR: 3.7 — ABNORMAL HIGH (ref 0.9–1.1)
PROTHROMBIN TIME: 44.5 s

## 2016-03-10 NOTE — Patient Instructions (Signed)
Anticoagulation Dose Instructions as of 03/10/2016      [name] Tue Wed Thu Fri Sat   New Dose 5 mg 2.5 mg 5 mg 5 mg 5 mg 2.5 mg 5 mg    Description        No coumadin today and tomorrow.  Thursday resume your regular schedule. Make sure you are getting a high vitamin K foods 1-2 times a week.  INR today is 3.7

## 2016-03-24 ENCOUNTER — Ambulatory Visit (INDEPENDENT_AMBULATORY_CARE_PROVIDER_SITE_OTHER): Payer: Medicare Other | Admitting: Pharmacist

## 2016-03-24 DIAGNOSIS — Z7901 Long term (current) use of anticoagulants: Secondary | ICD-10-CM

## 2016-03-24 DIAGNOSIS — I481 Persistent atrial fibrillation: Secondary | ICD-10-CM | POA: Diagnosis not present

## 2016-03-24 DIAGNOSIS — I4819 Other persistent atrial fibrillation: Secondary | ICD-10-CM

## 2016-03-24 DIAGNOSIS — E785 Hyperlipidemia, unspecified: Secondary | ICD-10-CM

## 2016-03-24 LAB — COAGUCHEK XS/INR WAIVED
INR: 2.4 — AB (ref 0.9–1.1)
PROTHROMBIN TIME: 28.4 s

## 2016-03-24 MED ORDER — EZETIMIBE 10 MG PO TABS: 10.0000 mg | ORAL_TABLET | Freq: Every day | ORAL | Status: DC

## 2016-03-24 NOTE — Patient Instructions (Signed)
Anticoagulation Dose Instructions as of 03/24/2016      [name] Tue Wed Thu Fri Sat   New Dose 5 mg 2.5 mg 5 mg 5 mg 5 mg 2.5 mg 5 mg    Description        Continue current warfarin dose of '5mg'$  - take 1/2 tablet on Mondays and Fridays.  Take 1 tablet all other days.     INR was 2.4 today

## 2016-03-24 NOTE — Progress Notes (Signed)
Subjective:     Indication: atrial fibrillation Bleeding signs/symptoms: None Thromboembolic signs/symptoms: None  Missed Coumadin doses: None Medication changes: no Dietary changes: yes - eating green leafy vegetables more consistantly - 1-2 times per week Bacterial/viral infection: no Other concerns: yes - cholesterol / LDL has increased at last check.  Patient is taking lovastatin '40mg'$  but only about 3 days per week due to myalgias.  He is intolerant to other statins.    Objective:    INR Today: 2.4 Current dose: warfarin '5mg'$  1/2 tablet mondays and fridays.  1 tablet all other days. Lipid panel from  02/12/2016 LDL-P = 1491 LDL = 120 HDL = 51 Tg = 128     Assessment:    Therapeutic INR for goal of 2-3   hyperlipidemia  Plan:    1. New dose: no change in warfarin dose 2. Next INR: 6 weeks 3.  Add zetia '10mg'$  1 tablet daily to lovastatin

## 2016-04-03 ENCOUNTER — Other Ambulatory Visit: Payer: Self-pay | Admitting: Family Medicine

## 2016-04-16 ENCOUNTER — Other Ambulatory Visit: Payer: Self-pay | Admitting: Family Medicine

## 2016-04-16 ENCOUNTER — Other Ambulatory Visit: Payer: Self-pay | Admitting: Pharmacist

## 2016-04-21 DIAGNOSIS — H16143 Punctate keratitis, bilateral: Secondary | ICD-10-CM | POA: Diagnosis not present

## 2016-04-21 DIAGNOSIS — H47093 Other disorders of optic nerve, not elsewhere classified, bilateral: Secondary | ICD-10-CM | POA: Diagnosis not present

## 2016-04-21 DIAGNOSIS — H43393 Other vitreous opacities, bilateral: Secondary | ICD-10-CM | POA: Diagnosis not present

## 2016-04-21 DIAGNOSIS — H43812 Vitreous degeneration, left eye: Secondary | ICD-10-CM | POA: Diagnosis not present

## 2016-05-12 ENCOUNTER — Ambulatory Visit (INDEPENDENT_AMBULATORY_CARE_PROVIDER_SITE_OTHER): Payer: Medicare Other | Admitting: Pharmacist

## 2016-05-12 DIAGNOSIS — I481 Persistent atrial fibrillation: Secondary | ICD-10-CM

## 2016-05-12 DIAGNOSIS — Z7901 Long term (current) use of anticoagulants: Secondary | ICD-10-CM

## 2016-05-12 DIAGNOSIS — I4819 Other persistent atrial fibrillation: Secondary | ICD-10-CM

## 2016-05-12 LAB — COAGUCHEK XS/INR WAIVED
INR: 3.9 — AB (ref 0.9–1.1)
Prothrombin Time: 46.5 s

## 2016-05-18 DIAGNOSIS — S0501XA Injury of conjunctiva and corneal abrasion without foreign body, right eye, initial encounter: Secondary | ICD-10-CM | POA: Diagnosis not present

## 2016-05-18 DIAGNOSIS — H179 Unspecified corneal scar and opacity: Secondary | ICD-10-CM | POA: Diagnosis not present

## 2016-05-18 DIAGNOSIS — H25811 Combined forms of age-related cataract, right eye: Secondary | ICD-10-CM | POA: Diagnosis not present

## 2016-05-18 DIAGNOSIS — H16143 Punctate keratitis, bilateral: Secondary | ICD-10-CM | POA: Diagnosis not present

## 2016-05-22 DIAGNOSIS — H25811 Combined forms of age-related cataract, right eye: Secondary | ICD-10-CM | POA: Diagnosis not present

## 2016-05-22 DIAGNOSIS — H16143 Punctate keratitis, bilateral: Secondary | ICD-10-CM | POA: Diagnosis not present

## 2016-05-22 DIAGNOSIS — S0501XD Injury of conjunctiva and corneal abrasion without foreign body, right eye, subsequent encounter: Secondary | ICD-10-CM | POA: Diagnosis not present

## 2016-05-22 DIAGNOSIS — H16041 Marginal corneal ulcer, right eye: Secondary | ICD-10-CM | POA: Diagnosis not present

## 2016-05-22 DIAGNOSIS — H179 Unspecified corneal scar and opacity: Secondary | ICD-10-CM | POA: Diagnosis not present

## 2016-05-23 DIAGNOSIS — H16041 Marginal corneal ulcer, right eye: Secondary | ICD-10-CM | POA: Diagnosis not present

## 2016-05-25 DIAGNOSIS — H16041 Marginal corneal ulcer, right eye: Secondary | ICD-10-CM | POA: Diagnosis not present

## 2016-05-25 DIAGNOSIS — Z961 Presence of intraocular lens: Secondary | ICD-10-CM | POA: Diagnosis not present

## 2016-05-25 DIAGNOSIS — H25811 Combined forms of age-related cataract, right eye: Secondary | ICD-10-CM | POA: Diagnosis not present

## 2016-05-29 DIAGNOSIS — H25811 Combined forms of age-related cataract, right eye: Secondary | ICD-10-CM | POA: Diagnosis not present

## 2016-05-29 DIAGNOSIS — H16041 Marginal corneal ulcer, right eye: Secondary | ICD-10-CM | POA: Diagnosis not present

## 2016-05-29 DIAGNOSIS — Z961 Presence of intraocular lens: Secondary | ICD-10-CM | POA: Diagnosis not present

## 2016-06-02 ENCOUNTER — Ambulatory Visit (INDEPENDENT_AMBULATORY_CARE_PROVIDER_SITE_OTHER): Payer: Medicare Other | Admitting: Pharmacist

## 2016-06-02 DIAGNOSIS — I482 Chronic atrial fibrillation, unspecified: Secondary | ICD-10-CM

## 2016-06-02 DIAGNOSIS — Z7901 Long term (current) use of anticoagulants: Secondary | ICD-10-CM

## 2016-06-02 DIAGNOSIS — I4819 Other persistent atrial fibrillation: Secondary | ICD-10-CM

## 2016-06-02 LAB — COAGUCHEK XS/INR WAIVED
INR: 2.3 — AB (ref 0.9–1.1)
PROTHROMBIN TIME: 27.2 s

## 2016-06-08 DIAGNOSIS — H25811 Combined forms of age-related cataract, right eye: Secondary | ICD-10-CM | POA: Diagnosis not present

## 2016-06-08 DIAGNOSIS — S0501XD Injury of conjunctiva and corneal abrasion without foreign body, right eye, subsequent encounter: Secondary | ICD-10-CM | POA: Diagnosis not present

## 2016-06-08 DIAGNOSIS — H179 Unspecified corneal scar and opacity: Secondary | ICD-10-CM | POA: Diagnosis not present

## 2016-06-08 DIAGNOSIS — H16143 Punctate keratitis, bilateral: Secondary | ICD-10-CM | POA: Diagnosis not present

## 2016-06-20 DIAGNOSIS — H16143 Punctate keratitis, bilateral: Secondary | ICD-10-CM | POA: Diagnosis not present

## 2016-06-20 DIAGNOSIS — S0501XA Injury of conjunctiva and corneal abrasion without foreign body, right eye, initial encounter: Secondary | ICD-10-CM | POA: Diagnosis not present

## 2016-06-20 DIAGNOSIS — H179 Unspecified corneal scar and opacity: Secondary | ICD-10-CM | POA: Diagnosis not present

## 2016-06-20 DIAGNOSIS — H25811 Combined forms of age-related cataract, right eye: Secondary | ICD-10-CM | POA: Diagnosis not present

## 2016-06-22 DIAGNOSIS — S0501XD Injury of conjunctiva and corneal abrasion without foreign body, right eye, subsequent encounter: Secondary | ICD-10-CM | POA: Diagnosis not present

## 2016-06-22 DIAGNOSIS — H25811 Combined forms of age-related cataract, right eye: Secondary | ICD-10-CM | POA: Diagnosis not present

## 2016-06-22 DIAGNOSIS — H179 Unspecified corneal scar and opacity: Secondary | ICD-10-CM | POA: Diagnosis not present

## 2016-06-22 DIAGNOSIS — H16143 Punctate keratitis, bilateral: Secondary | ICD-10-CM | POA: Diagnosis not present

## 2016-06-24 ENCOUNTER — Other Ambulatory Visit: Payer: Self-pay | Admitting: Family Medicine

## 2016-06-24 DIAGNOSIS — H25811 Combined forms of age-related cataract, right eye: Secondary | ICD-10-CM | POA: Diagnosis not present

## 2016-06-24 DIAGNOSIS — H179 Unspecified corneal scar and opacity: Secondary | ICD-10-CM | POA: Diagnosis not present

## 2016-06-24 DIAGNOSIS — H16143 Punctate keratitis, bilateral: Secondary | ICD-10-CM | POA: Diagnosis not present

## 2016-06-24 DIAGNOSIS — S0501XD Injury of conjunctiva and corneal abrasion without foreign body, right eye, subsequent encounter: Secondary | ICD-10-CM | POA: Diagnosis not present

## 2016-07-01 DIAGNOSIS — H179 Unspecified corneal scar and opacity: Secondary | ICD-10-CM | POA: Diagnosis not present

## 2016-07-01 DIAGNOSIS — H25811 Combined forms of age-related cataract, right eye: Secondary | ICD-10-CM | POA: Diagnosis not present

## 2016-07-01 DIAGNOSIS — H04123 Dry eye syndrome of bilateral lacrimal glands: Secondary | ICD-10-CM | POA: Diagnosis not present

## 2016-07-01 DIAGNOSIS — H16143 Punctate keratitis, bilateral: Secondary | ICD-10-CM | POA: Diagnosis not present

## 2016-07-02 DIAGNOSIS — M1611 Unilateral primary osteoarthritis, right hip: Secondary | ICD-10-CM | POA: Diagnosis not present

## 2016-07-07 ENCOUNTER — Ambulatory Visit (INDEPENDENT_AMBULATORY_CARE_PROVIDER_SITE_OTHER): Payer: Medicare Other | Admitting: Family Medicine

## 2016-07-07 ENCOUNTER — Encounter: Payer: Self-pay | Admitting: Family Medicine

## 2016-07-07 VITALS — BP 175/112 | HR 88 | Temp 97.2°F | Ht 67.0 in | Wt 189.0 lb

## 2016-07-07 DIAGNOSIS — R030 Elevated blood-pressure reading, without diagnosis of hypertension: Secondary | ICD-10-CM

## 2016-07-07 DIAGNOSIS — K219 Gastro-esophageal reflux disease without esophagitis: Secondary | ICD-10-CM

## 2016-07-07 DIAGNOSIS — E559 Vitamin D deficiency, unspecified: Secondary | ICD-10-CM

## 2016-07-07 DIAGNOSIS — E8881 Metabolic syndrome: Secondary | ICD-10-CM

## 2016-07-07 DIAGNOSIS — I481 Persistent atrial fibrillation: Secondary | ICD-10-CM | POA: Diagnosis not present

## 2016-07-07 DIAGNOSIS — N400 Enlarged prostate without lower urinary tract symptoms: Secondary | ICD-10-CM

## 2016-07-07 DIAGNOSIS — E785 Hyperlipidemia, unspecified: Secondary | ICD-10-CM | POA: Diagnosis not present

## 2016-07-07 DIAGNOSIS — H5712 Ocular pain, left eye: Secondary | ICD-10-CM

## 2016-07-07 DIAGNOSIS — I10 Essential (primary) hypertension: Secondary | ICD-10-CM

## 2016-07-07 DIAGNOSIS — I4819 Other persistent atrial fibrillation: Secondary | ICD-10-CM

## 2016-07-07 DIAGNOSIS — Z23 Encounter for immunization: Secondary | ICD-10-CM

## 2016-07-07 DIAGNOSIS — Z7901 Long term (current) use of anticoagulants: Secondary | ICD-10-CM | POA: Diagnosis not present

## 2016-07-07 DIAGNOSIS — IMO0001 Reserved for inherently not codable concepts without codable children: Secondary | ICD-10-CM

## 2016-07-07 LAB — COAGUCHEK XS/INR WAIVED
INR: 1.8 — AB (ref 0.9–1.1)
PROTHROMBIN TIME: 21.6 s

## 2016-07-07 NOTE — Progress Notes (Signed)
Subjective:    Patient ID: [name], male    DOB: 1932-08-05, 82 y.o.   MRN: [id number]  HPI Pt here for follow up and management of chronic medical problems which includes hyperlipidemia, hypertension and a fib. He is taking medications regularly. Patient today complains of some recent eye problems. He also complains of a headache. He'll get his flu shot today and lab work. He follows up regularly with the urologist, Dr. Jeffie Pollock. The wife has given me a lot of the patient's history. He is having a lot of problems with his hip and his had some work done on his hip but no cortisone shots. He is having a lot of problems with the left eye and is seeing 2 different ophthalmologist for this. 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 The patient has not had any chest pain or shortness of breath and he is not having any trouble with his stomach and no blood in the stool or black tarry bowel movements. He is followed regularly by the urologist every 6 months and he is not sure when the next visit is coming view. It is important to note that since his blood pressure has been up over the past 4 weeks that he has had more headache problem and we think that the blood pressure is up also due to the pain he's been having in his hip and his eye. We do know that the blood pressure was good back in June and it was in the 1:30 range over the 80 range. We will encourage the patient to check his blood pressures more regularly and make sure that he gets back in touch with [place] in a couple weeks with these readings. The patient is also being seeing the orthopedist because of right hip pain and this is better. After talking with him he confirmed the history that I received from his wife. He denies any chest pain shortness of breath trouble swallowing heartburn indigestion nausea vomiting  diarrhea or blood in the stool. He denies any problems currently with passing his water. The big issue is once again his left eye. The other issue is his increased blood pressure.    Patient Active Problem List   Diagnosis Date Noted  . Right hip pain 02/12/2016  . Metabolic syndrome 36/62/9476  . Hematuria 01/22/2015  . Acute blood loss anemia 01/22/2015  . Urinary retention 01/21/2015  . UTI (lower urinary tract infection) 01/21/2015  . Cataract of both eyes 05/25/2014  . Long term current use of anticoagulant therapy 02/14/2013  . OVERWEIGHT 12/11/2009  . ANEMIA 02/25/2009  . Essential hypertension 02/25/2009  . Coronary atherosclerosis 02/25/2009  . Atrial fibrillation, persistent ([place]) 02/25/2009  . GERD 02/25/2009  . DEGENERATIVE JOINT DISEASE 02/25/2009   Outpatient Encounter Prescriptions as of 07/07/2016  Medication Sig  . acetaminophen (TYLENOL) 650 MG CR tablet Take 650 mg by mouth every 8 (eight) hours as needed for pain.   . carvedilol (COREG) 12.5 MG tablet Take 0.5 tablets (6.25 mg total) by mouth 2 (two) times daily.  . cholecalciferol (VITAMIN D) 400 UNITS TABS tablet Take 1,000 Units by mouth.  . diltiazem (DILACOR XR) 120 MG 24 hr capsule TAKE ONE (1) CAPSULE EACH DAY  . ezetimibe (ZETIA) 10 MG tablet Take 1 tablet (10 mg total) by mouth  daily. Along with lovastatin  . finasteride (PROSCAR) 5 MG tablet TAKE ONE (1) TABLET EACH DAY  . furosemide (LASIX) 20 MG tablet TAKE ONE (1) TABLET EACH DAY  . lovastatin (MEVACOR) 40 MG tablet TAKE ONE (1) TABLET EACH DAY  . Polyethyl Glycol-Propyl Glycol (SYSTANE) 0.4-0.3 % GEL ophthalmic gel Place 1 application into both eyes as needed.   . prednisoLONE acetate (PRED FORTE) 1 % ophthalmic suspension Place 1 drop into both eyes 4 (four) times daily.  Marland Kitchen warfarin (COUMADIN) 5 MG tablet TAKE ONE (1) TABLET EACH DAY   No facility-administered encounter medications on file as of 07/07/2016.       Review of Systems    Constitutional: Negative.   Eyes: Negative.        Recent eye trouble  Respiratory: Negative.   Cardiovascular: Negative.   Gastrointestinal: Negative.   Endocrine: Negative.   Genitourinary: Negative.   Musculoskeletal: Negative.   Skin: Negative.   Allergic/Immunologic: Negative.   Neurological: Positive for headaches.  Hematological: Negative.   Psychiatric/Behavioral: Negative.        Objective:   Physical Exam  Constitutional: He is oriented to person, place, and time. He appears well-developed and well-nourished. No distress.  The patient is pleasant and calm and alert  HENT:  Head: Normocephalic and atraumatic.  Right Ear: External ear normal.  Left Ear: External ear normal.  Mouth/Throat: Oropharynx is clear and moist. No oropharyngeal exudate.  Slight nasal congestion  Eyes: Conjunctivae and EOM are normal. Pupils are equal, round, and reactive to light. Right eye exhibits no discharge. Left eye exhibits no discharge. No scleral icterus.  Neck: Normal range of motion. Neck supple. No thyromegaly present.  No bruits thyromegaly or anterior cervical adenopathy  Cardiovascular: Normal rate and intact distal pulses.   No murmur heard. Heart is irregular irregular at 84/m  Pulmonary/Chest: Effort normal and breath sounds normal. No respiratory distress. He has no wheezes. He has no rales. He exhibits no tenderness.  No axillary adenopathy Clear anteriorly and posteriorly  Abdominal: Soft. Bowel sounds are normal. He exhibits no mass. There is no tenderness. There is no rebound and no guarding.  No liver or spleen enlargement no abdominal tenderness no inguinal adenopathy  Musculoskeletal: Normal range of motion. He exhibits no edema.  Lymphadenopathy:    He has no cervical adenopathy.  Neurological: He is alert and oriented to person, place, and time. He has normal reflexes. No cranial nerve deficit.  Skin: Skin is warm and dry. No rash noted.  Psychiatric: He has a  normal mood and affect. His behavior is normal. Judgment and thought content normal.  Nursing note and vitals reviewed.  BP (!) 175/112 (BP Location: Right Arm)   Pulse 88   Temp 97.2 F (36.2 C) (Oral)   Ht 5' 7"  (1.702 m)   Wt 189 lb (85.7 kg)   BMI 29.60 kg/m         Assessment & Plan:  1. Atrial fibrillation, persistent ([place]) -Patient will continue to follow-up with the cardiologist. - CBC with Differential/Platelet - CoaguChek XS/INR Waived  2. Hyperlipidemia -He is statin intolerant and therefore have to continue with aggressive therapeutic lifestyle changes which include diet and exercise - CBC with Differential/Platelet - NMR, lipoprofile  3. [name] (benign prostatic hyperplasia) -Patient will continue to follow-up with urology - CBC with Differential/Platelet - PSA, total and free  4. Essential hypertension -The blood pressure is elevated today he is been having headaches and home readings have also been  elevated and we will ask him to increase the carvedilol to 12.5 mg twice daily instead of a half a one twice daily. - BMP8+EGFR - CBC with Differential/Platelet - Hepatic function panel  5. Vitamin D deficiency -Continue current treatment pending results of lab work - CBC with Differential/Platelet - VITAMIN D 25 Hydroxy (Vit-D Deficiency, Fractures)  6. Metabolic syndrome -Continue with as aggressive therapeutic lifestyle changes as possible - BMP8+EGFR - CBC with Differential/Platelet  7. Gastroesophageal reflux disease, esophagitis presence not specified -He denies any symptoms today with reflux and he will continue with current treatment - CBC with Differential/Platelet - Hepatic function panel  8. Encounter for immunization - Flu vaccine HIGH DOSE PF  9. Elevated blood pressure -Increase carvedilol to 12.5 mg twice daily  10. Left eye pain -Continue to follow-up with ophthalmology  Patient Instructions                       Medicare Annual  Wellness Visit  [facility] and the medical providers at [facility] strive to bring you the best medical care.  In doing so we not only want to address your current medical conditions and concerns but also to detect new conditions early and prevent illness, disease and health-related problems.    Medicare offers a yearly Wellness Visit which allows our clinical staff to assess your need for preventative services including immunizations, lifestyle education, counseling to decrease risk of preventable diseases and screening for fall risk and other medical concerns.    This visit is provided free of charge (no copay) for all Medicare recipients. The clinical pharmacists at [facility] have begun to conduct these Wellness Visits which will also include a thorough review of all your medications.    As you primary medical provider recommend that you make an appointment for your Annual Wellness Visit if you have not done so already this year.  You may set up this appointment before you leave today or you may call back ([phone]) and schedule an appointment.  Please make sure when you call that you mention that you are scheduling your Annual Wellness Visit with the clinical pharmacist so that the appointment may be made for the proper length of time.     Continue current medications. Continue good therapeutic lifestyle changes which include good diet and exercise. Fall precautions discussed with patient. If an FOBT was given today- please return it to our front desk. If you are over 61 years old - you may need Prevnar 68 or the adult Pneumonia vaccine.  **Flu shots are available--- please call and schedule a FLU-CLINIC appointment**  After your visit with [place] today you will receive a survey in the mail or online from [employer] regarding your care with [place]. Please take a moment to fill this out. Your feedback is very important to [place] as you can help [place] better  understand your patient needs as well as improve your experience and satisfaction. WE CARE ABOUT YOU!!!   Flu shot  today may make your arm sore Continue to follow-up with ophthalmology Continue to follow-up with urology and cardiology as planned Increase carvedilol to 12.5 mg twice daily Return to clinic in 2 weeks and bring home blood pressure readings with you to that visit    Arrie Senate MD

## 2016-07-07 NOTE — Patient Instructions (Addendum)
Medicare Annual Wellness Visit  [facility] and the medical providers at [facility] strive to bring you the best medical care.  In doing so we not only want to address your current medical conditions and concerns but also to detect new conditions early and prevent illness, disease and health-related problems.    Medicare offers a yearly Wellness Visit which allows our clinical staff to assess your need for preventative services including immunizations, lifestyle education, counseling to decrease risk of preventable diseases and screening for fall risk and other medical concerns.    This visit is provided free of charge (no copay) for all Medicare recipients. The clinical pharmacists at [facility] have begun to conduct these Wellness Visits which will also include a thorough review of all your medications.    As you primary medical provider recommend that you make an appointment for your Annual Wellness Visit if you have not done so already this year.  You may set up this appointment before you leave today or you may call back ([phone]) and schedule an appointment.  Please make sure when you call that you mention that you are scheduling your Annual Wellness Visit with the clinical pharmacist so that the appointment may be made for the proper length of time.     Continue current medications. Continue good therapeutic lifestyle changes which include good diet and exercise. Fall precautions discussed with patient. If an FOBT was given today- please return it to our front desk. If you are over 80 years old - you may need Prevnar 13 or the adult Pneumonia vaccine.  **Flu shots are available--- please call and schedule a FLU-CLINIC appointment**  After your visit with [place] today you will receive a survey in the mail or online from [employer] regarding your care with [place]. Please take a moment to fill this out. Your feedback is very  important to [place] as you can help [place] better understand your patient needs as well as improve your experience and satisfaction. WE CARE ABOUT YOU!!!   Flu shot  today may make your arm sore Continue to follow-up with ophthalmology Continue to follow-up with urology and cardiology as planned Increase carvedilol to 12.5 mg twice daily Return to clinic in 2 weeks and bring home blood pressure readings with you to that visit

## 2016-07-08 ENCOUNTER — Telehealth: Payer: Self-pay | Admitting: Family Medicine

## 2016-07-08 LAB — NMR, LIPOPROFILE
CHOLESTEROL: 190 mg/dL (ref 100–199)
HDL CHOLESTEROL BY NMR: 58 mg/dL (ref >39)
HDL Particle Number: 28.2 umol/L — ABNORMAL LOW (ref >=30.5)
LDL PARTICLE NUMBER: 1381 nmol/L — AB (ref <1000)
LDL Size: 21.9 nm (ref >20.5)
LDL-C: 101 mg/dL — AB (ref 0–99)
Small LDL Particle Number: 284 nmol/L (ref <=527)
Triglycerides by NMR: 156 mg/dL — ABNORMAL HIGH (ref 0–149)

## 2016-07-08 LAB — CBC WITH DIFFERENTIAL/PLATELET
BASOS: 0 %
Basophils Absolute: 0 10*3/uL (ref 0.0–0.2)
EOS (ABSOLUTE): 0.7 10*3/uL — AB (ref 0.0–0.4)
EOS: 4 %
HEMATOCRIT: 45.9 % (ref 37.5–51.0)
Hemoglobin: 15.8 g/dL (ref 12.6–17.7)
Immature Grans (Abs): 0.1 10*3/uL (ref 0.0–0.1)
Immature Granulocytes: 1 %
LYMPHS ABS: 7.3 10*3/uL — AB (ref 0.7–3.1)
Lymphs: 43 %
MCH: 33.2 pg — AB (ref 26.6–33.0)
MCHC: 34.4 g/dL (ref 31.5–35.7)
MCV: 96 fL (ref 79–97)
MONOS ABS: 1 10*3/uL — AB (ref 0.1–0.9)
Monocytes: 6 %
NEUTROS ABS: 7.8 10*3/uL — AB (ref 1.4–7.0)
Neutrophils: 46 %
PLATELETS: 279 10*3/uL (ref 150–379)
RBC: 4.76 x10E6/uL (ref 4.14–5.80)
RDW: 13.9 % (ref 12.3–15.4)
WBC: 16.9 10*3/uL — ABNORMAL HIGH (ref 3.4–10.8)

## 2016-07-08 LAB — HEPATIC FUNCTION PANEL
ALBUMIN: 4.6 g/dL (ref 3.5–4.7)
ALK PHOS: 62 IU/L (ref 39–117)
ALT: 26 IU/L (ref 0–44)
AST: 22 IU/L (ref 0–40)
BILIRUBIN TOTAL: 0.6 mg/dL (ref 0.0–1.2)
Bilirubin, Direct: 0.17 mg/dL (ref 0.00–0.40)
TOTAL PROTEIN: 7 g/dL (ref 6.0–8.5)

## 2016-07-08 LAB — BMP8+EGFR
BUN/Creatinine Ratio: 15 (ref 10–24)
BUN: 15 mg/dL (ref 8–27)
CHLORIDE: 101 mmol/L (ref 96–106)
CO2: 24 mmol/L (ref 18–29)
CREATININE: 1.01 mg/dL (ref 0.76–1.27)
Calcium: 9.5 mg/dL (ref 8.6–10.2)
GFR calc non Af Amer: 69 mL/min/{1.73_m2} (ref >59)
GFR, EST AFRICAN AMERICAN: 80 mL/min/{1.73_m2} (ref >59)
Glucose: 106 mg/dL — ABNORMAL HIGH (ref 65–99)
Potassium: 4.3 mmol/L (ref 3.5–5.2)
SODIUM: 144 mmol/L (ref 134–144)

## 2016-07-08 LAB — VITAMIN D 25 HYDROXY (VIT D DEFICIENCY, FRACTURES): Vit D, 25-Hydroxy: 34.8 ng/mL (ref 30.0–100.0)

## 2016-07-08 LAB — PSA, TOTAL AND FREE
PSA FREE: 1.6 ng/mL
PSA, Free Pct: 18.6 %
Prostate Specific Ag, Serum: 8.6 ng/mL — ABNORMAL HIGH (ref 0.0–4.0)

## 2016-07-08 NOTE — Telephone Encounter (Signed)
Wife aware of lab results 

## 2016-07-13 ENCOUNTER — Other Ambulatory Visit: Payer: Self-pay | Admitting: Family Medicine

## 2016-07-21 ENCOUNTER — Encounter: Payer: Self-pay | Admitting: Family Medicine

## 2016-07-21 ENCOUNTER — Ambulatory Visit (INDEPENDENT_AMBULATORY_CARE_PROVIDER_SITE_OTHER): Payer: Medicare Other | Admitting: Family Medicine

## 2016-07-21 VITALS — BP 157/81 | HR 76 | Temp 97.4°F | Ht 67.0 in | Wt 192.0 lb

## 2016-07-21 DIAGNOSIS — I10 Essential (primary) hypertension: Secondary | ICD-10-CM

## 2016-07-21 DIAGNOSIS — I481 Persistent atrial fibrillation: Secondary | ICD-10-CM

## 2016-07-21 DIAGNOSIS — I4819 Other persistent atrial fibrillation: Secondary | ICD-10-CM

## 2016-07-21 NOTE — Progress Notes (Signed)
Subjective:    Patient ID: Greg VENHUIZEN, male    DOB: 02-02-1932, 82 y.o.   MRN: [id number]  HPI Patient here today for 2 week follow up on HTN. Her reticulocyte has been changed. The patient brings in home blood pressure readings from the past 2 weeks. His morning blood pressures have been running anywhere from the upper 120s to the mid 140s. The systolics in the evening have been running higher between 140s up to 160 range. The diastolics both morning and evening have been good. The patient is calm as usual. He thinks his blood pressures at home have been improved since we increased the carvedilol to 6.25 twice daily.    Patient Active Problem List   Diagnosis Date Noted  . Right hip pain 02/12/2016  . Metabolic syndrome 69/67/8938  . Hematuria 01/22/2015  . Acute blood loss anemia 01/22/2015  . Urinary retention 01/21/2015  . UTI (lower urinary tract infection) 01/21/2015  . Cataract of both eyes 05/25/2014  . Long term current use of anticoagulant therapy 02/14/2013  . OVERWEIGHT 12/11/2009  . ANEMIA 02/25/2009  . Essential hypertension 02/25/2009  . Coronary atherosclerosis 02/25/2009  . Atrial fibrillation, persistent ([place]) 02/25/2009  . GERD 02/25/2009  . DEGENERATIVE JOINT DISEASE 02/25/2009   Outpatient Encounter Prescriptions as of 07/21/2016  Medication Sig  . acetaminophen (TYLENOL) 650 MG CR tablet Take 650 mg by mouth every 8 (eight) hours as needed for pain.   . carvedilol (COREG) 12.5 MG tablet Take 0.5 tablets (6.25 mg total) by mouth 2 (two) times daily.  . cholecalciferol (VITAMIN D) 400 UNITS TABS tablet Take 1,000 Units by mouth.  . diltiazem (DILACOR XR) 120 MG 24 hr capsule TAKE ONE (1) CAPSULE EACH DAY  . ezetimibe (ZETIA) 10 MG tablet Take 1 tablet (10 mg total) by mouth daily. Along with lovastatin  . finasteride (PROSCAR) 5 MG tablet TAKE ONE (1) TABLET EACH DAY  . furosemide (LASIX) 20 MG tablet TAKE ONE (1) TABLET EACH DAY  . lovastatin (MEVACOR) 40  MG tablet TAKE ONE (1) TABLET EACH DAY  . Polyethyl Glycol-Propyl Glycol (SYSTANE) 0.4-0.3 % GEL ophthalmic gel Place 1 application into both eyes as needed.   . prednisoLONE acetate (PRED FORTE) 1 % ophthalmic suspension Place 1 drop into both eyes 4 (four) times daily.  Marland Kitchen warfarin (COUMADIN) 5 MG tablet TAKE ONE (1) TABLET EACH DAY  . [DISCONTINUED] carvedilol (COREG) 12.5 MG tablet TAKE ONE TABLET BY MOUTH TWICE DAILY   No facility-administered encounter medications on file as of 07/21/2016.       Review of Systems  Constitutional: Negative.   HENT: Negative.   Eyes: Negative.   Respiratory: Negative.   Cardiovascular: Negative.   Gastrointestinal: Negative.   Endocrine: Negative.   Genitourinary: Negative.   Musculoskeletal: Negative.   Skin: Negative.   Allergic/Immunologic: Negative.   Neurological: Negative.   Hematological: Negative.   Psychiatric/Behavioral: Negative.        Objective:   Physical Exam  Constitutional: He is oriented to person, place, and time. He appears well-developed and well-nourished. No distress.  The patient is alert and pleasant  HENT:  Head: Normocephalic.  Eyes: Conjunctivae and EOM are normal. Pupils are equal, round, and reactive to light. Right eye exhibits no discharge. Left eye exhibits no discharge. No scleral icterus.  Neck: Normal range of motion.  Cardiovascular: Normal rate and normal heart sounds.   No murmur heard. Heart is irregular irregular at 72-84/m  Pulmonary/Chest: Effort normal and breath  sounds normal. He has no wheezes. He has no rales.  Musculoskeletal: Normal range of motion. He exhibits no edema.  Neurological: He is alert and oriented to person, place, and time.  Skin: Skin is warm and dry. No rash noted.  Psychiatric: He has a normal mood and affect. His behavior is normal. Judgment and thought content normal.  Nursing note and vitals reviewed.  BP (!) 157/81 (BP Location: Left Arm)   Pulse 76   Temp 97.4 F  (36.3 C) (Oral)   Ht '5\' 7"'$  (1.702 m)   Wt 192 lb (87.1 kg)   BMI 30.07 kg/m         Assessment & Plan:  1. Atrial fibrillation, persistent ([facility]) -Increase carvedilol 12.5 mg to 1-1/2 in the morning and one in the evening -The patient will continue to monitor his heart rate and today this was 72-84/m  2. Essential hypertension -As above -He will bring blood pressure readings by in a couple weeks and let the nurse check his blood pressure here for me to review  Patient Instructions  Patient will increase the carvedilol to 12-1/2 mg 1-1/2 in the morning and 12-1/2 mg in the evening He will do this for 2 weeks and return to the office with blood pressure readings for blood pressure check by the nurse We'll monitor his heart rates which he says at home have been in the 60s usually. The heart rate today was 72-84.  Arrie Senate MD

## 2016-07-21 NOTE — Patient Instructions (Signed)
Patient will increase the carvedilol to 12-1/2 mg 1-1/2 in the morning and 12-1/2 mg in the evening He will do this for 2 weeks and return to the office with blood pressure readings for blood pressure check by the nurse We'll monitor his heart rates which he says at home have been in the 60s usually. The heart rate today was 72-84.

## 2016-07-24 ENCOUNTER — Other Ambulatory Visit: Payer: Self-pay | Admitting: Family Medicine

## 2016-07-29 ENCOUNTER — Ambulatory Visit: Payer: Self-pay | Admitting: Pharmacist

## 2016-08-05 ENCOUNTER — Ambulatory Visit (INDEPENDENT_AMBULATORY_CARE_PROVIDER_SITE_OTHER): Payer: Medicare Other | Admitting: *Deleted

## 2016-08-05 ENCOUNTER — Other Ambulatory Visit: Payer: Self-pay | Admitting: *Deleted

## 2016-08-05 ENCOUNTER — Telehealth: Payer: Self-pay | Admitting: Family Medicine

## 2016-08-05 VITALS — BP 158/76 | HR 77

## 2016-08-05 DIAGNOSIS — I10 Essential (primary) hypertension: Secondary | ICD-10-CM

## 2016-08-05 MED ORDER — EZETIMIBE 10 MG PO TABS: 10.0000 mg | ORAL_TABLET | Freq: Every day | ORAL | 5 refills | Status: DC

## 2016-08-05 MED ORDER — EZETIMIBE 10 MG PO TABS: 10.0000 mg | ORAL_TABLET | Freq: Every day | ORAL | Status: DC

## 2016-08-05 NOTE — Progress Notes (Signed)
Pt here for BP check

## 2016-08-05 NOTE — Progress Notes (Signed)
Pt requesting copy of labs and refill of Zetia Copies given to pt RX for Zetia [facility] per Dr Laurance Flatten

## 2016-08-13 ENCOUNTER — Other Ambulatory Visit: Payer: Self-pay | Admitting: Family Medicine

## 2016-08-13 ENCOUNTER — Encounter: Payer: Self-pay | Admitting: Pharmacist

## 2016-08-13 ENCOUNTER — Ambulatory Visit (INDEPENDENT_AMBULATORY_CARE_PROVIDER_SITE_OTHER): Payer: Medicare Other | Admitting: Pharmacist

## 2016-08-13 VITALS — BP 148/78 | HR 72 | Ht 67.0 in | Wt 195.5 lb

## 2016-08-13 DIAGNOSIS — I4819 Other persistent atrial fibrillation: Secondary | ICD-10-CM

## 2016-08-13 DIAGNOSIS — Z7901 Long term (current) use of anticoagulants: Secondary | ICD-10-CM

## 2016-08-13 DIAGNOSIS — I481 Persistent atrial fibrillation: Secondary | ICD-10-CM | POA: Diagnosis not present

## 2016-08-13 DIAGNOSIS — Z0000 Encounter for general adult medical examination without abnormal findings: Secondary | ICD-10-CM | POA: Diagnosis not present

## 2016-08-13 DIAGNOSIS — R7309 Other abnormal glucose: Secondary | ICD-10-CM

## 2016-08-13 LAB — BAYER DCA HB A1C WAIVED: HB A1C (BAYER DCA - WAIVED): 5.8 % (ref <7.0)

## 2016-08-13 LAB — COAGUCHEK XS/INR WAIVED
INR: 3.3 — AB (ref 0.9–1.1)
Prothrombin Time: 39.6 s

## 2016-08-13 MED ORDER — CARVEDILOL 12.5 MG PO TABS: ORAL_TABLET | ORAL | 1 refills | Status: DC

## 2016-08-13 NOTE — Progress Notes (Signed)
Patient ID: [name], male   DOB: August 04, 1932, 82 y.o.   MRN: [id number]     Subjective:   [name] is a 82 y.o. male who presents for a subsequent Medicare Annual Wellness Visit and to have INR checked today.  [name] is married and retired.  He lives with his wife in [place], [place].  He continues to do ADLs and work around his home.  Recently he has had some problems with his right eye lid. 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  The eye lid does not completely cover his right eye and due to this he has experienced dry eyes.  He is seeing opthalmologist regularly.    No other complaint for concerns today  Current Medications (verified) Outpatient Encounter Prescriptions as of 08/13/2016  Medication Sig  . acetaminophen (TYLENOL) 650 MG CR tablet Take 650 mg by mouth every 8 (eight) hours as needed for pain.   . carvedilol (COREG) 12.5 MG tablet Take 1 and 1/2 tablets each morning and 1 tablet each evening  . cholecalciferol (VITAMIN D) 400 UNITS TABS tablet Take 1,000 Units by mouth.  . diltiazem (DILACOR XR) 120 MG 24 hr capsule TAKE ONE (1) CAPSULE EACH DAY  . ezetimibe (ZETIA) 10 MG tablet Take 1 tablet (10 mg total) by mouth daily. Along with lovastatin  . finasteride (PROSCAR) 5 MG tablet TAKE ONE (1) TABLET EACH DAY  . furosemide (LASIX) 20 MG tablet TAKE ONE (1) TABLET EACH DAY  . lovastatin (MEVACOR) 40 MG tablet TAKE ONE (1) TABLET EACH DAY  . Polyethyl Glycol-Propyl Glycol (SYSTANE) 0.4-0.3 % GEL ophthalmic gel Place 1 application into both eyes as needed.   . warfarin (COUMADIN) 5 MG tablet TAKE ONE (1) TABLET EACH DAY  . [DISCONTINUED] carvedilol (COREG) 12.5 MG tablet Take 0.5 tablets (6.25 mg total) by mouth 2 (two) times daily.  . [DISCONTINUED] prednisoLONE acetate (PRED FORTE) 1 % ophthalmic suspension Place 1 drop into both eyes 4 (four) times daily.  . [DISCONTINUED] warfarin (COUMADIN) 5 MG tablet TAKE ONE (1) TABLET EACH DAY   No  facility-administered encounter medications on file as of 08/13/2016.     Allergies (verified) Penicillins; Hct [hydrochlorothiazide]; Liptruzet [ezetimibe-atorvastatin]; and Statins   History: Past Medical History:  Diagnosis Date  . Anemia   . Atrial fibrillation ([facility] [facility])   . Cataract   . Cellulitis    In past  . Coronary artery disease    5 bypasses  . DJD (degenerative joint disease)   . Ejection fraction < 50%    Mildly reduced, 40% by echo  . GERD (gastroesophageal reflux disease)   . Hyperlipidemia   . Hypertension   . Persistent atrial fibrillation ([place])   . Prostate hypertrophy    on CT scan 09/2014  . PUD (peptic ulcer disease)   . Skin cancer of eyelid    Resected   Past Surgical History:  Procedure Laterality Date  . APPENDECTOMY    . BYPASS GRAFT  2005  . CATARACT EXTRACTION W/PHACO Left 07/22/2015   Procedure: CATARACT EXTRACTION PHACO AND INTRAOCULAR LENS PLACEMENT LEFT EYE CDE=8.14;  Surgeon: Tonny Branch, MD;  Location: [facility];  Service: Ophthalmology;  Laterality: Left;  . CORONARY ARTERY BYPASS GRAFT  4/05   LIMA to LAD, SVG to PDA, SVG to ramus intermediate  . EYE SURGERY  07/2015  . EYELID CARCINOMA EXCISION     Skin cancer resected  . Heart bypass  2005  .  TOTAL HIP ARTHROPLASTY     Right  . TOTAL KNEE ARTHROPLASTY     Right   Family History  Problem Relation Age of Onset  . Stroke Mother   . Stroke Father   . Hypertension Father   . Early death Brother     32 months old  . Cancer Brother     lung  . Stroke Brother     heat   Social History   Occupational History  . retired    Social History Main Topics  . Smoking status: Former Smoker    Packs/day: 2.00    Years: 5.00    Types: Cigarettes    Quit date: 12/23/1963  . Smokeless tobacco: Never Used  . Alcohol use No  . Drug use: No  . Sexual activity: Yes    Do you feel safe at home?  Yes Are there smokers in your home (other than you)? No  Dietary issues and exercise  activities discussed: Current Exercise Habits: Home exercise routine, Type of exercise: walking, Time (Minutes): 15, Frequency (Times/Week): 2, Weekly Exercise (Minutes/Week): 30  Current Dietary habits:  Eats 3 meals per day. Limits salt intake due to HTN.   He usually eat 1-2 serving of greens per week but has not had as much lately.    Cardiac Risk Factors include: advanced age (>92mn, >>22women);dyslipidemia;family history of premature cardiovascular disease;hypertension;male gender;obesity (BMI >30kg/m2);sedentary lifestyle  Objective:    Today's Vitals   08/13/16 0841  BP: (!) 159/82  Pulse: 72  Weight: 195 lb 8 oz (88.7 kg)  Height: '5\' 7"'$  (1.702 m)  PainSc: 0-No pain   Body mass index is 30.62 kg/m.  INR was 3.3 today  Activities of Daily Living In your present state of health, do you have any difficulty performing the following activities: 08/13/2016  Hearing? N  Vision? N  Difficulty concentrating or making decisions? N  Walking or climbing stairs? N  Dressing or bathing? N  Doing errands, shopping? N  Preparing Food and eating ? N  Using the Toilet? N  In the past six months, have you accidently leaked urine? N  Do you have problems with loss of bowel control? N  Managing your Medications? N  Managing your Finances? N  Housekeeping or managing your Housekeeping? N  Some recent data might be hidden     Depression Screen PHQ 2/9 Scores 08/13/2016 07/21/2016 02/12/2016 09/16/2015  PHQ - 2 Score 0 0 0 0     Fall Risk Fall Risk  08/13/2016 07/21/2016 02/12/2016 09/16/2015 09/03/2015  Falls in the past year? No No No No No    Cognitive Function: MMSE - Mini Mental State Exam 08/13/2016 07/25/2015  Not completed: Refused -  Orientation to time - 5  Orientation to Place - 5  Registration - 3  Attention/ Calculation - 3  Recall - 3  Language- name 2 objects - 2  Language- repeat - 1  Language- follow 3 step command - 3  Language- read & follow direction - 1    Write a sentence - 1  Copy design - 1  Total score - 28    Immunizations and Health Maintenance Immunization History  Administered Date(s) Administered  . Influenza, High Dose Seasonal PF 07/07/2016  . Influenza,inj,Quad PF,36+ Mos 07/17/2013, 07/05/2014, 07/17/2015  . Pneumococcal Conjugate-13 10/30/2013  . Tdap 10/13/2011   There are no preventive care reminders to display for this patient.  Patient Care Team: DChipper Herb MD as PCP - General (  Family Medicine) Irine Seal, MD as Attending Physician (Urology) Minus Breeding, MD as Consulting Physician (Cardiology) Rogene Houston, MD as Consulting Physician (Gastroenterology) Tonny Branch, MD as Consulting Physician (Ophthalmology) [name], MD as Consulting Physician (Orthopedic Surgery)  Indicate any recent [facility] [place] you may have received from other than [facility] providers in the past year (date may be approximate).    Assessment:    Annual Wellness Visit  supratherapeutic anticoagulation   Screening Tests Health Maintenance  Topic Date Due  . PNA vac Low Risk Adult (2 of 2 - PPSV23) 09/13/2016 (Originally 10/30/2014)  . ZOSTAVAX  02/04/2017 (Originally 08/31/1994)  . TETANUS/TDAP  10/12/2021  . INFLUENZA VACCINE  Completed        Plan:   During the course of the visit [name] was educated and counseled about the following appropriate screening and preventive services:   Vaccines to include Pneumoccal, Influenza,  Td, Zostavax - UTD except Zostavax and he declined this due to cost  Colorectal cancer screening - last 2005 - declined  Cardiovascular disease screening - UTD  BP elevated - Rx sent to pharmacy for carvedilol 12.'5mg'$  take 1 and 1/2 tablet qam and 1 tablet qpm  Diabetes screening - checking A1c today due to elevated BG at last visit.  Glaucoma screening /Eye Exam - UTD  Nutrition counseling - continue to limit sodium intake.  Keep green leafy vegetable intake consistant.  Limit sugar  intake  Advanced Directives - patient declined - states he has his wishes written down at home but I don't think it is an official AD / HPOA or living will  Physical Activity - increase as able - goal is 150 minutes per week  Anticoagulation Dose Instructions as of 08/13/2016      Sun Mon Tue Wed Thu Fri Sat   New Dose 5 mg 2.5 mg 5 mg 2.5 mg 5 mg 2.5 mg 2.5 mg    Description   No warfarin today - Thursday, November 2nd.  Then decrease warfarin dose to 1/2 tablet on mondays, wednesdays and fridays.  Take 1 tablet all other days      Patient Instructions (the written plan) were given to the patient.   Cherre Robins, PharmD   08/13/2016

## 2016-08-13 NOTE — Patient Instructions (Addendum)
  Mr. Lanum , Thank you for taking time to come for your Medicare Wellness Visit. I appreciate your ongoing commitment to your health goals. Please review the following plan we discussed and let me know if I can assist you in the future.   These are the goals we discussed:  Goal is to get at least 150 minutes of physical activity each week.   Increase non-starchy vegetables - carrots, green bean, squash, zucchini, tomatoes, onions, peppers, spinach and other green leafy vegetables, cabbage, lettuce, cucumbers, asparagus, okra (not fried), eggplant Limit sugar and processed foods (cakes, cookies, ice cream, crackers and chips) Increase fresh fruit but limit serving sizes 1/2 cup or about the size of tennis or baseball Limit red meat to no more than 1-2 times per week (serving size about the size of your palm) Choose whole grains / lean proteins - whole wheat bread, quinoa, whole grain rice (1/2 cup), fish, chicken, [place] Avoid sugar and calorie containing beverages - soda, sweet tea and juice.  Choose water or unsweetened tea instead.   This is a list of the screening recommended for you and due dates:  Health Maintenance  Topic Date Due  . Pneumonia vaccines (2 of 2 - PPSV23) completed  . Shingles Vaccine  Deferred - will check cost  . Tetanus Vaccine  10/12/2021  . Flu Shot  Completed  *Topic was postponed. The date shown is not the original due date.

## 2016-09-14 ENCOUNTER — Other Ambulatory Visit: Payer: Self-pay | Admitting: Family Medicine

## 2016-09-14 ENCOUNTER — Ambulatory Visit (INDEPENDENT_AMBULATORY_CARE_PROVIDER_SITE_OTHER): Payer: Medicare Other | Admitting: Pharmacist

## 2016-09-14 DIAGNOSIS — Z7901 Long term (current) use of anticoagulants: Secondary | ICD-10-CM

## 2016-09-14 DIAGNOSIS — I481 Persistent atrial fibrillation: Secondary | ICD-10-CM | POA: Diagnosis not present

## 2016-09-14 DIAGNOSIS — I4819 Other persistent atrial fibrillation: Secondary | ICD-10-CM

## 2016-09-14 LAB — COAGUCHEK XS/INR WAIVED
INR: 2.1 — ABNORMAL HIGH (ref 0.9–1.1)
Prothrombin Time: 24.9 s

## 2016-09-14 MED ORDER — EZETIMIBE 10 MG PO TABS: 10.0000 mg | ORAL_TABLET | Freq: Every day | ORAL | 5 refills | Status: DC

## 2016-10-19 DIAGNOSIS — H179 Unspecified corneal scar and opacity: Secondary | ICD-10-CM | POA: Diagnosis not present

## 2016-10-19 DIAGNOSIS — H16143 Punctate keratitis, bilateral: Secondary | ICD-10-CM | POA: Diagnosis not present

## 2016-10-19 DIAGNOSIS — H25811 Combined forms of age-related cataract, right eye: Secondary | ICD-10-CM | POA: Diagnosis not present

## 2016-10-19 DIAGNOSIS — H04123 Dry eye syndrome of bilateral lacrimal glands: Secondary | ICD-10-CM | POA: Diagnosis not present

## 2016-10-26 ENCOUNTER — Ambulatory Visit (INDEPENDENT_AMBULATORY_CARE_PROVIDER_SITE_OTHER): Payer: Medicare Other | Admitting: Pharmacist

## 2016-10-26 ENCOUNTER — Encounter: Payer: Self-pay | Admitting: Pharmacist

## 2016-10-26 DIAGNOSIS — I482 Chronic atrial fibrillation, unspecified: Secondary | ICD-10-CM

## 2016-10-26 DIAGNOSIS — I4819 Other persistent atrial fibrillation: Secondary | ICD-10-CM

## 2016-10-26 DIAGNOSIS — Z7901 Long term (current) use of anticoagulants: Secondary | ICD-10-CM | POA: Diagnosis not present

## 2016-10-26 LAB — COAGUCHEK XS/INR WAIVED
INR: 1.4 — ABNORMAL HIGH (ref 0.9–1.1)
Prothrombin Time: 17.1 s

## 2016-11-12 ENCOUNTER — Other Ambulatory Visit: Payer: Self-pay | Admitting: Family Medicine

## 2016-11-19 ENCOUNTER — Encounter: Payer: Self-pay | Admitting: Family Medicine

## 2016-11-19 ENCOUNTER — Ambulatory Visit (INDEPENDENT_AMBULATORY_CARE_PROVIDER_SITE_OTHER): Payer: Medicare Other | Admitting: Family Medicine

## 2016-11-19 VITALS — BP 148/80 | HR 82 | Temp 97.0°F | Ht 67.0 in | Wt 190.0 lb

## 2016-11-19 DIAGNOSIS — R972 Elevated prostate specific antigen [PSA]: Secondary | ICD-10-CM | POA: Diagnosis not present

## 2016-11-19 DIAGNOSIS — K219 Gastro-esophageal reflux disease without esophagitis: Secondary | ICD-10-CM | POA: Diagnosis not present

## 2016-11-19 DIAGNOSIS — I481 Persistent atrial fibrillation: Secondary | ICD-10-CM | POA: Diagnosis not present

## 2016-11-19 DIAGNOSIS — I700 Atherosclerosis of aorta: Secondary | ICD-10-CM

## 2016-11-19 DIAGNOSIS — Z7901 Long term (current) use of anticoagulants: Secondary | ICD-10-CM | POA: Diagnosis not present

## 2016-11-19 DIAGNOSIS — E559 Vitamin D deficiency, unspecified: Secondary | ICD-10-CM | POA: Diagnosis not present

## 2016-11-19 DIAGNOSIS — E7800 Pure hypercholesterolemia, unspecified: Secondary | ICD-10-CM

## 2016-11-19 DIAGNOSIS — E8881 Metabolic syndrome: Secondary | ICD-10-CM | POA: Diagnosis not present

## 2016-11-19 DIAGNOSIS — I482 Chronic atrial fibrillation, unspecified: Secondary | ICD-10-CM

## 2016-11-19 DIAGNOSIS — I4819 Other persistent atrial fibrillation: Secondary | ICD-10-CM

## 2016-11-19 DIAGNOSIS — N400 Enlarged prostate without lower urinary tract symptoms: Secondary | ICD-10-CM

## 2016-11-19 DIAGNOSIS — I10 Essential (primary) hypertension: Secondary | ICD-10-CM | POA: Diagnosis not present

## 2016-11-19 DIAGNOSIS — R208 Other disturbances of skin sensation: Secondary | ICD-10-CM

## 2016-11-19 LAB — COAGUCHEK XS/INR WAIVED
INR: 1.3 — AB (ref 0.9–1.1)
PROTHROMBIN TIME: 15.8 s

## 2016-11-19 NOTE — Patient Instructions (Signed)
Medicare Annual Wellness Visit  [place] and the medical providers at [facility] strive to bring you the best medical care.  In doing so we not only want to address your current medical conditions and concerns but also to detect new conditions early and prevent illness, disease and health-related problems.    Medicare offers a yearly Wellness Visit which allows our clinical staff to assess your need for preventative services including immunizations, lifestyle education, counseling to decrease risk of preventable diseases and screening for fall risk and other medical concerns.    This visit is provided free of charge (no copay) for all Medicare recipients. The clinical pharmacists at [facility] have begun to conduct these Wellness Visits which will also include a thorough review of all your medications.    As you primary medical provider recommend that you make an appointment for your Annual Wellness Visit if you have not done so already this year.  You may set up this appointment before you leave today or you may call back ([phone]) and schedule an appointment.  Please make sure when you call that you mention that you are scheduling your Annual Wellness Visit with the clinical pharmacist so that the appointment may be made for the proper length of time.     Continue current medications. Continue good therapeutic lifestyle changes which include good diet and exercise. Fall precautions discussed with patient. If an FOBT was given today- please return it to our front desk. If you are over 50 years old - you may need Prevnar 13 or the adult Pneumonia vaccine.  **Flu shots are available--- please call and schedule a FLU-CLINIC appointment**  After your visit with us today you will receive a survey in the mail or online from [employer] regarding your care with us. Please take a moment to fill this out. Your feedback is very  important to us as you can help us better understand your patient needs as well as improve your experience and satisfaction. WE CARE ABOUT YOU!!!    

## 2016-11-19 NOTE — Progress Notes (Signed)
Subjective:    Patient ID: [name], male    DOB: [birth year], 83 y.o.   MRN: [id number]  HPI Pt here for follow up and management of chronic medical problems which includes hyperlipidemia and hypertension. He is taking medication regularly.The patient has stopped his lovastatin due to myalgias and he is only on Zetia currently. He does complain of burning in his feet. A shows wife came with him to the visit today. She says she checks his blood pressures at home every day and they're always in the 130s over the 60s and 70s. The patient has an erratic history of taking his statin drug. He stopped the lovastatin when he was prescribed Zetia. He is only had Zetia for the past 3 weeks. There was some concern that the lovastatin may have call some of his muscle aches and myalgias which did improve somewhat with stopping the lovastatin. He is also seeing Dr.Ramos for shots in his back. He is due to see the cardiologist because of his atrial fib in May. He can call the orthopedist for injections in his back at any time. He denies any problems with shortness of breath other than what would be expected. He denies any problems with nausea vomiting diarrhea blood in the stool or black tarry bowel movements. Passing his water without problems. It is slow. He has had some problems with an elevated PSA and he would like for [place] to check this today and he will schedule an appointment with the urologist when the results are returned.    Patient Active Problem List   Diagnosis Date Noted  . Right hip pain 02/12/2016  . Metabolic syndrome 40/98/1191  . Hematuria 01/22/2015  . Acute blood loss anemia 01/22/2015  . Urinary retention 01/21/2015  . UTI (lower urinary tract infection) 01/21/2015  . Cataract of both eyes 05/25/2014  . Long term current use of anticoagulant therapy 02/14/2013  . OVERWEIGHT 12/11/2009  . ANEMIA 02/25/2009  . Essential hypertension 02/25/2009  . Coronary atherosclerosis 02/25/2009    . Atrial fibrillation, persistent ([place]) 02/25/2009  . GERD 02/25/2009  . DEGENERATIVE JOINT DISEASE 02/25/2009   Outpatient Encounter Prescriptions as of 11/19/2016  Medication Sig  . acetaminophen (TYLENOL) 650 MG CR tablet Take 650 mg by mouth every 8 (eight) hours as needed for pain.   . carvedilol (COREG) 12.5 MG tablet Take 1 and 1/2 tablets each morning and 1 tablet each evening  . cholecalciferol (VITAMIN D) 400 UNITS TABS tablet Take 1,000 Units by mouth.  . diltiazem (DILACOR XR) 120 MG 24 hr capsule TAKE ONE (1) CAPSULE EACH DAY  . ezetimibe (ZETIA) 10 MG tablet Take 1 tablet (10 mg total) by mouth daily. Along with lovastatin  . finasteride (PROSCAR) 5 MG tablet TAKE ONE (1) TABLET EACH DAY  . furosemide (LASIX) 20 MG tablet TAKE ONE (1) TABLET EACH DAY  . Polyethyl Glycol-Propyl Glycol (SYSTANE) 0.4-0.3 % GEL ophthalmic gel Place 1 application into both eyes as needed.   . warfarin (COUMADIN) 5 MG tablet TAKE ONE (1) TABLET EACH DAY  . [DISCONTINUED] lovastatin (MEVACOR) 40 MG tablet TAKE ONE (1) TABLET EACH DAY   No facility-administered encounter medications on file as of 11/19/2016.       Review of Systems  Constitutional: Negative.   HENT: Negative.   Eyes: Negative.   Respiratory: Negative.   Cardiovascular: Negative.   Gastrointestinal: Negative.   Endocrine: Negative.   Genitourinary: Negative.   Musculoskeletal: Positive for arthralgias. Back pain: leg /  knee pain.  Skin: Negative.   Allergic/Immunologic: Negative.   Neurological: Negative.        Feet burning  Hematological: Negative.   Psychiatric/Behavioral: Negative.        Objective:   Physical Exam  Constitutional: He is oriented to person, place, and time. He appears well-developed and well-nourished. No distress.  HENT:  Head: Normocephalic and atraumatic.  Right Ear: External ear normal.  Left Ear: External ear normal.  Nose: Nose normal.  Mouth/Throat: Oropharynx is clear and moist. No  oropharyngeal exudate.  Eyes: Conjunctivae and EOM are normal. Pupils are equal, round, and reactive to light. Right eye exhibits no discharge. Left eye exhibits no discharge. No scleral icterus.  Neck: Normal range of motion. Neck supple. No thyromegaly present.  No bruits or thyromegaly  Cardiovascular: Normal rate, normal heart sounds and intact distal pulses.   No murmur heard. The heart is irregular irregular at 96/m  Pulmonary/Chest: Effort normal and breath sounds normal. No respiratory distress. He has no wheezes. He has no rales. He exhibits no tenderness.  Lungs were clear anteriorly and posteriorly and no axillary adenopathy  Abdominal: Soft. Bowel sounds are normal. He exhibits no mass. There is tenderness. There is no rebound and no guarding.  Slight epigastric abdominal tenderness in the area of incisional scars. No liver or spleen enlargement no suprapubic tenderness and no inguinal adenopathy  Musculoskeletal: Normal range of motion. He exhibits no edema.  Lymphadenopathy:    He has no cervical adenopathy.  Neurological: He is alert and oriented to person, place, and time. He has normal reflexes. No cranial nerve deficit.  Skin: Skin is warm and dry. No rash noted.  Psychiatric: He has a normal mood and affect. His behavior is normal. Judgment and thought content normal.  Nursing note and vitals reviewed.   BP (!) 149/100 (BP Location: Left Arm)   Pulse 82   Temp 97 F (36.1 C) (Oral)   Ht 5' 7"  (1.702 m)   Wt 190 lb (86.2 kg)   BMI 29.76 kg/m        Assessment & Plan:  1. Essential hypertension -The blood pressure is elevated today. He will bring readings back from home and bring his monitor in to compare with our monitor for a blood pressure recheck. - BMP8+EGFR - CBC with Differential/Platelet - Hepatic function panel  2. Vitamin D deficiency -[place] current treatment pending results of lab work - CBC with Differential/Platelet - VITAMIN D 25 Hydroxy  (Vit-D Deficiency, Fractures)  3. Benign prostatic hyperplasia, unspecified whether lower urinary tract symptoms present -Follow-up with urology once the PSA is returned - CBC with Differential/Platelet - PSA, total and free  4. Chronic atrial fibrillation ([place]) -Follow-up with cardiology as planned - CBC with Differential/Platelet - CoaguChek XS/INR Waived  5. Metabolic syndrome -Continue to work on weight through diet and exercise - CBC with Differential/Platelet  6. Gastroesophageal reflux disease, esophagitis presence not specified -Continue current treatment pending results of lab work - CBC with Differential/Platelet  7. Pure hypercholesterolemia -For now, continue with Zetia. When the cholesterol numbers are returned if the numbers remain elevated we will add back half of the lovastatin that he was on in the past a set of a whole one. He will be encouraged to call us if his muscle aches and myalgias return with adding back the lovastatin. He should continue with aggressive therapeutic lifestyle changes - CBC with Differential/Platelet - Lipid panel  8. Thoracic aortic atherosclerosis (HCC) -Treat cholesterol and follow  therapeutic lifestyle changes  9. Elevated PSA -Follow-up with urology  10. Burning sensation of feet -Patient has good circulation - Vitamin B12  Patient Instructions                       Medicare Annual Wellness Visit  [facility] and the medical providers at [facility] strive to bring you the best medical care.  In doing so we not only want to address your current medical conditions and concerns but also to detect new conditions early and prevent illness, disease and health-related problems.    Medicare offers a yearly Wellness Visit which allows our clinical staff to assess your need for preventative services including immunizations, lifestyle education, counseling to decrease risk of preventable diseases and screening for  fall risk and other medical concerns.    This visit is provided free of charge (no copay) for all Medicare recipients. The clinical pharmacists at [facility] have begun to conduct these Wellness Visits which will also include a thorough review of all your medications.    As you primary medical provider recommend that you make an appointment for your Annual Wellness Visit if you have not done so already this year.  You may set up this appointment before you leave today or you may call back ([phone]) and schedule an appointment.  Please make sure when you call that you mention that you are scheduling your Annual Wellness Visit with the clinical pharmacist so that the appointment may be made for the proper length of time.     Continue current medications. Continue good therapeutic lifestyle changes which include good diet and exercise. Fall precautions discussed with patient. If an FOBT was given today- please return it to our front desk. If you are over 66 years old - you may need Prevnar 14 or the adult Pneumonia vaccine.  **Flu shots are available--- please call and schedule a FLU-CLINIC appointment**  After your visit with [place] today you will receive a survey in the mail or online from [employer] regarding your care with [place]. Please take a moment to fill this out. Your feedback is very important to [place] as you can help [place] better understand your patient needs as well as improve your experience and satisfaction. WE CARE ABOUT YOU!!!     Arrie Senate MD

## 2016-11-20 ENCOUNTER — Other Ambulatory Visit: Payer: Self-pay | Admitting: *Deleted

## 2016-11-20 LAB — VITAMIN B12: VITAMIN B 12: 309 pg/mL (ref 232–1245)

## 2016-11-20 LAB — BMP8+EGFR
BUN/Creatinine Ratio: 14 (ref 10–24)
BUN: 15 mg/dL (ref 8–27)
CHLORIDE: 103 mmol/L (ref 96–106)
CO2: 25 mmol/L (ref 18–29)
Calcium: 9.6 mg/dL (ref 8.6–10.2)
Creatinine, Ser: 1.08 mg/dL (ref 0.76–1.27)
GFR calc non Af Amer: 64 mL/min/{1.73_m2} (ref >59)
GFR, EST AFRICAN AMERICAN: 74 mL/min/{1.73_m2} (ref >59)
Glucose: 111 mg/dL — ABNORMAL HIGH (ref 65–99)
POTASSIUM: 4.1 mmol/L (ref 3.5–5.2)
SODIUM: 145 mmol/L — AB (ref 134–144)

## 2016-11-20 LAB — CBC WITH DIFFERENTIAL/PLATELET
Basophils Absolute: 0.1 10*3/uL (ref 0.0–0.2)
Basos: 0 %
EOS (ABSOLUTE): 0.7 10*3/uL — AB (ref 0.0–0.4)
Eos: 5 %
Hematocrit: 46.4 % (ref 37.5–51.0)
Hemoglobin: 14.9 g/dL (ref 13.0–17.7)
Immature Grans (Abs): 0 10*3/uL (ref 0.0–0.1)
Immature Granulocytes: 0 %
LYMPHS ABS: 5.3 10*3/uL — AB (ref 0.7–3.1)
Lymphs: 41 %
MCH: 32.3 pg (ref 26.6–33.0)
MCHC: 32.1 g/dL (ref 31.5–35.7)
MCV: 100 fL — AB (ref 79–97)
MONOS ABS: 0.6 10*3/uL (ref 0.1–0.9)
Monocytes: 5 %
NEUTROS ABS: 6.4 10*3/uL (ref 1.4–7.0)
Neutrophils: 49 %
PLATELETS: 262 10*3/uL (ref 150–379)
RBC: 4.62 x10E6/uL (ref 4.14–5.80)
RDW: 13.3 % (ref 12.3–15.4)
WBC: 13.2 10*3/uL — ABNORMAL HIGH (ref 3.4–10.8)

## 2016-11-20 LAB — PSA, TOTAL AND FREE
PSA, Free Pct: 16.5 %
PSA, Free: 1.32 ng/mL
Prostate Specific Ag, Serum: 8 ng/mL — ABNORMAL HIGH (ref 0.0–4.0)

## 2016-11-20 LAB — HEPATIC FUNCTION PANEL
ALT: 14 IU/L (ref 0–44)
AST: 16 IU/L (ref 0–40)
Albumin: 4.4 g/dL (ref 3.5–4.7)
Alkaline Phosphatase: 59 IU/L (ref 39–117)
BILIRUBIN, DIRECT: 0.15 mg/dL (ref 0.00–0.40)
Bilirubin Total: 0.5 mg/dL (ref 0.0–1.2)
TOTAL PROTEIN: 6.9 g/dL (ref 6.0–8.5)

## 2016-11-20 LAB — VITAMIN D 25 HYDROXY (VIT D DEFICIENCY, FRACTURES): VIT D 25 HYDROXY: 33.2 ng/mL (ref 30.0–100.0)

## 2016-11-20 LAB — LIPID PANEL
Chol/HDL Ratio: 5 ratio units (ref 0.0–5.0)
Cholesterol, Total: 250 mg/dL — ABNORMAL HIGH (ref 100–199)
HDL: 50 mg/dL (ref >39)
LDL Calculated: 164 mg/dL — ABNORMAL HIGH (ref 0–99)
Triglycerides: 180 mg/dL — ABNORMAL HIGH (ref 0–149)
VLDL CHOLESTEROL CAL: 36 mg/dL (ref 5–40)

## 2016-11-20 MED ORDER — LOVASTATIN 40 MG PO TABS: 20.0000 mg | ORAL_TABLET | Freq: Every day | ORAL | 1 refills | Status: DC

## 2016-11-30 DIAGNOSIS — X32XXXD Exposure to sunlight, subsequent encounter: Secondary | ICD-10-CM | POA: Diagnosis not present

## 2016-11-30 DIAGNOSIS — L820 Inflamed seborrheic keratosis: Secondary | ICD-10-CM | POA: Diagnosis not present

## 2016-11-30 DIAGNOSIS — B078 Other viral warts: Secondary | ICD-10-CM | POA: Diagnosis not present

## 2016-11-30 DIAGNOSIS — D225 Melanocytic nevi of trunk: Secondary | ICD-10-CM | POA: Diagnosis not present

## 2016-11-30 DIAGNOSIS — L570 Actinic keratosis: Secondary | ICD-10-CM | POA: Diagnosis not present

## 2016-12-02 DIAGNOSIS — M1612 Unilateral primary osteoarthritis, left hip: Secondary | ICD-10-CM | POA: Diagnosis not present

## 2016-12-08 ENCOUNTER — Ambulatory Visit (INDEPENDENT_AMBULATORY_CARE_PROVIDER_SITE_OTHER): Payer: Medicare Other | Admitting: Pharmacist

## 2016-12-08 DIAGNOSIS — I4819 Other persistent atrial fibrillation: Secondary | ICD-10-CM

## 2016-12-08 DIAGNOSIS — I481 Persistent atrial fibrillation: Secondary | ICD-10-CM

## 2016-12-08 DIAGNOSIS — Z7901 Long term (current) use of anticoagulants: Secondary | ICD-10-CM | POA: Diagnosis not present

## 2016-12-08 LAB — COAGUCHEK XS/INR WAIVED
INR: 1.6 — AB (ref 0.9–1.1)
Prothrombin Time: 19.6 s

## 2016-12-08 MED ORDER — LOVASTATIN 20 MG PO TABS: 20.0000 mg | ORAL_TABLET | Freq: Every day | ORAL | 1 refills | Status: DC

## 2016-12-14 ENCOUNTER — Other Ambulatory Visit: Payer: Self-pay | Admitting: Pharmacist

## 2016-12-17 ENCOUNTER — Encounter: Payer: Self-pay | Admitting: Pharmacist

## 2016-12-23 ENCOUNTER — Encounter: Payer: Self-pay | Admitting: *Deleted

## 2016-12-28 ENCOUNTER — Ambulatory Visit (INDEPENDENT_AMBULATORY_CARE_PROVIDER_SITE_OTHER): Payer: Medicare Other | Admitting: Pharmacist

## 2016-12-28 DIAGNOSIS — Z7901 Long term (current) use of anticoagulants: Secondary | ICD-10-CM

## 2016-12-28 DIAGNOSIS — I4819 Other persistent atrial fibrillation: Secondary | ICD-10-CM

## 2016-12-28 DIAGNOSIS — I481 Persistent atrial fibrillation: Secondary | ICD-10-CM | POA: Diagnosis not present

## 2016-12-28 LAB — COAGUCHEK XS/INR WAIVED
INR: 2.7 — AB (ref 0.9–1.1)
Prothrombin Time: 32.2 s

## 2017-01-06 ENCOUNTER — Other Ambulatory Visit: Payer: Self-pay | Admitting: Family Medicine

## 2017-02-01 ENCOUNTER — Telehealth: Payer: Self-pay | Admitting: Pharmacist

## 2017-02-01 ENCOUNTER — Ambulatory Visit (INDEPENDENT_AMBULATORY_CARE_PROVIDER_SITE_OTHER): Payer: Medicare Other | Admitting: Pharmacist

## 2017-02-01 DIAGNOSIS — Z7901 Long term (current) use of anticoagulants: Secondary | ICD-10-CM

## 2017-02-01 DIAGNOSIS — I4819 Other persistent atrial fibrillation: Secondary | ICD-10-CM

## 2017-02-01 DIAGNOSIS — I481 Persistent atrial fibrillation: Secondary | ICD-10-CM

## 2017-02-01 LAB — COAGUCHEK XS/INR WAIVED
INR: 2 — ABNORMAL HIGH (ref 0.9–1.1)
Prothrombin Time: 24.4 s

## 2017-02-01 NOTE — Telephone Encounter (Signed)
Pt aware that Dr [name] Parisian can do this here - appt made

## 2017-02-03 ENCOUNTER — Ambulatory Visit (INDEPENDENT_AMBULATORY_CARE_PROVIDER_SITE_OTHER): Payer: Medicare Other | Admitting: Family Medicine

## 2017-02-03 ENCOUNTER — Ambulatory Visit (INDEPENDENT_AMBULATORY_CARE_PROVIDER_SITE_OTHER): Payer: Medicare Other

## 2017-02-03 ENCOUNTER — Encounter: Payer: Self-pay | Admitting: Family Medicine

## 2017-02-03 VITALS — BP 124/87 | HR 82 | Temp 97.0°F | Ht 67.0 in | Wt 192.4 lb

## 2017-02-03 DIAGNOSIS — M25562 Pain in left knee: Secondary | ICD-10-CM | POA: Diagnosis not present

## 2017-02-03 DIAGNOSIS — M1712 Unilateral primary osteoarthritis, left knee: Secondary | ICD-10-CM | POA: Diagnosis not present

## 2017-02-03 MED ORDER — METHYLPREDNISOLONE ACETATE 80 MG/ML IJ SUSP
80.0000 mg | Freq: Once | INTRAMUSCULAR | Status: AC
  Administered 2017-02-03: 80 mg via INTRAMUSCULAR

## 2017-02-03 NOTE — Progress Notes (Signed)
BP 124/87   Pulse 82   Temp 97 F (36.1 C) (Oral)   Ht '5\' 7"'$  (1.702 m)   Wt 192 lb 6.4 oz (87.3 kg)   BMI 30.13 kg/m    Subjective:    Patient ID: [name], male    DOB: 03/09/1932, 83 y.o.   MRN: [id number]  HPI: [name] is a 83 y.o. male presenting on 02/03/2017 for Knee Pain (left knee pain for several months, would like injection)   HPI Left knee pain Patient is coming in today with complaints of left knee pain that has been worsening over the past couple months. He also admits that he has left hip pain that is been worsening as well. He are he had the right hip and the right knee replaced and is doing great on that side but mainly the left side is starting to bother him. With the left knee says he 7 more issues on the outside than inside but it is diffuse throughout the knee. He denies any fevers or chills or redness or warmth. The pain is low-grade at 3 out of 10. Says the pain does radiate up his leg towards his hip sometimes.  Relevant past medical, surgical, family and social history reviewed and updated as indicated. Interim medical history since our last visit reviewed. Allergies and medications reviewed and updated.  Review of Systems  Constitutional: Negative for chills and fever.  Respiratory: Negative for shortness of breath and wheezing.   Cardiovascular: Negative for chest pain and leg swelling.  Musculoskeletal: Positive for arthralgias. Negative for back pain, gait problem and myalgias.  Skin: Negative for color change and rash.  All other systems reviewed and are negative.  Per HPI unless specifically indicated above    Objective:    BP 124/87   Pulse 82   Temp 97 F (36.1 C) (Oral)   Ht '5\' 7"'$  (1.702 m)   Wt 192 lb 6.4 oz (87.3 kg)   BMI 30.13 kg/m   Wt Readings from Last 3 Encounters:  02/03/17 192 lb 6.4 oz (87.3 kg)  11/19/16 190 lb (86.2 kg)  08/13/16 195 lb 8 oz (88.7 kg)    Physical Exam  Constitutional: He is oriented to person,  place, and time. He appears well-developed and well-nourished. No distress.  Eyes: Conjunctivae are normal. No scleral icterus.  Musculoskeletal: Normal range of motion. He exhibits no edema.       Left knee: He exhibits normal range of motion, no erythema, normal alignment, no LCL laxity, normal patellar mobility, no bony tenderness, normal meniscus and no MCL laxity. Tenderness found. Lateral joint line tenderness noted. No patellar tendon tenderness noted.  Neurological: He is alert and oriented to person, place, and time. Coordination normal.  Skin: Skin is warm and dry. No rash noted. He is not diaphoretic.  Psychiatric: He has a normal mood and affect. His behavior is normal.  Nursing note and vitals reviewed.  Left knee x-ray: Good joint spaces, mild spurring of the bones but otherwise normal knee x-ray. Await final read by radiologist  Knee injection: Risk factors of bleeding and infection discussed with patient and patient is agreeable towards injection. Patient prepped with Betadine. Lateral approach towards injection used. Injected 80 mg of Depo-Medrol and 1 mL of 2% lidocaine. Patient tolerated procedure well and no side effects from noted. Minimal to no bleeding. Simple bandage applied after.     Assessment & Plan:   Problem List Items Addressed This Visit  None    Visit Diagnoses    Primary osteoarthritis of left knee    -  Primary   Mild arthritis on x-ray, some concern that pain is coming from hip but patient wants to do injection anyways   Relevant Medications   methylPREDNISolone acetate (DEPO-MEDROL) injection 80 mg (Start on 02/03/2017 11:30 AM)   Other Relevant Orders   DG Knee 1-2 Views Left      Follow up plan: Return if symptoms worsen or fail to improve.  Counseling provided for all of the vaccine components Orders Placed This Encounter  Procedures  . DG Knee 1-2 Views Left    Caryl Pina, MD [facility] 02/03/2017, 11:24  AM

## 2017-02-20 ENCOUNTER — Other Ambulatory Visit: Payer: Self-pay | Admitting: Family Medicine

## 2017-03-15 ENCOUNTER — Ambulatory Visit (INDEPENDENT_AMBULATORY_CARE_PROVIDER_SITE_OTHER): Payer: Medicare Other | Admitting: Pharmacist

## 2017-03-15 DIAGNOSIS — I481 Persistent atrial fibrillation: Secondary | ICD-10-CM

## 2017-03-15 DIAGNOSIS — Z7901 Long term (current) use of anticoagulants: Secondary | ICD-10-CM | POA: Diagnosis not present

## 2017-03-15 DIAGNOSIS — I4819 Other persistent atrial fibrillation: Secondary | ICD-10-CM

## 2017-03-15 LAB — COAGUCHEK XS/INR WAIVED
INR: 2.7 — ABNORMAL HIGH (ref 0.9–1.1)
Prothrombin Time: 32.8 s

## 2017-03-29 ENCOUNTER — Encounter: Payer: Self-pay | Admitting: Cardiology

## 2017-03-29 ENCOUNTER — Ambulatory Visit (INDEPENDENT_AMBULATORY_CARE_PROVIDER_SITE_OTHER): Payer: Medicare Other | Admitting: Cardiology

## 2017-03-29 ENCOUNTER — Other Ambulatory Visit: Payer: Self-pay | Admitting: Cardiology

## 2017-03-29 VITALS — BP 136/70 | HR 67 | Ht 67.0 in | Wt 192.0 lb

## 2017-03-29 DIAGNOSIS — R079 Chest pain, unspecified: Secondary | ICD-10-CM | POA: Insufficient documentation

## 2017-03-29 DIAGNOSIS — I5043 Acute on chronic combined systolic (congestive) and diastolic (congestive) heart failure: Secondary | ICD-10-CM

## 2017-03-29 DIAGNOSIS — R0989 Other specified symptoms and signs involving the circulatory and respiratory systems: Secondary | ICD-10-CM

## 2017-03-29 NOTE — Assessment & Plan Note (Signed)
Pt had chest pain "tightness" two weeks ago- he declined to go to the ED. He has had none since

## 2017-03-29 NOTE — Patient Instructions (Signed)
Medication Instructions:   No change  Labwork:   none  Testing/Procedures:  Your physician has requested that you have a lexiscan myoview. For further information please visit [email or web address]. Please follow instruction sheet, as given.  Your physician has requested that you have an echocardiogram. Echocardiography is a painless test that uses sound waves to create images of your heart. It provides your doctor with information about the size and shape of your heart and how well your heart's chambers and valves are working. This procedure takes approximately one hour. There are no restrictions for this procedure.  Your physician has requested that you have a carotid duplex. This test is an ultrasound of the carotid arteries in your neck. It looks at blood flow through these arteries that supply the brain with blood. Allow one hour for this exam. There are no restrictions or special instructions.    Follow-Up:  Follow up with Dr. Percival Spanish in [place] office in 1-2 months. We will call you if test results are abnormal, and if so, give instructions for sooner follow up.    If you need a refill on your cardiac medications before your next appointment, please call your pharmacy.

## 2017-03-29 NOTE — Assessment & Plan Note (Signed)
CAF- rate control and anticoagulation

## 2017-03-29 NOTE — Assessment & Plan Note (Signed)
Couamdin

## 2017-03-29 NOTE — Progress Notes (Signed)
03/29/2017 [name]   09/01/1932  [id number]  Primary Physician Chipper Herb, MD Primary Cardiologist: Dr Percival Spanish  HPI:  83 y/o male with a history of [name], s/p CABG x 3 in 2005 (3 days after he had Rt TKR). He has had no recent functional study but he has been stable. He has CAF being treated with rate control and Coumadin anticoagulation. He recently had SSCP "tightness" and called his PCP. They suggested he go to the ED, but if the pt wasn't going to the ED he should increase his Lasix for two days, which he did. He did improve and has not had recurrent chest tightness but the pt and his wife were concerned with the change in his previously stable status and wanted to be seen.    Current Outpatient Prescriptions  Medication Sig Dispense Refill  . acetaminophen (TYLENOL) 650 MG CR tablet Take 650 mg by mouth every 8 (eight) hours as needed for pain.     . carvedilol (COREG) 12.5 MG tablet TAKE 1&1/2 TABLET EVERY MORNING AND TAKE 1 TABLET EVERY EVENING 75 tablet 6  . cholecalciferol (VITAMIN D) 400 UNITS TABS tablet Take 1,000 Units by mouth.    . diltiazem (CARDIZEM CD) 120 MG 24 hr capsule TAKE ONE (1) CAPSULE EACH DAY 90 capsule 0  . ezetimibe (ZETIA) 10 MG tablet Take 1 tablet (10 mg total) by mouth daily. Along with lovastatin 30 tablet 5  . finasteride (PROSCAR) 5 MG tablet TAKE ONE (1) TABLET EACH DAY 30 tablet 5  . furosemide (LASIX) 20 MG tablet TAKE ONE (1) TABLET EACH DAY 30 tablet 2  . lovastatin (MEVACOR) 20 MG tablet Take 1 tablet (20 mg total) by mouth at bedtime. 90 tablet 1  . Polyethyl Glycol-Propyl Glycol (SYSTANE) 0.4-0.3 % GEL ophthalmic gel Place 1 application into both eyes as needed.     . warfarin (COUMADIN) 5 MG tablet TAKE ONE (1) TABLET EACH DAY 90 tablet 0   No current facility-administered medications for this visit.     Allergies  Allergen Reactions  . Penicillins Hives and Rash  . Hct [Hydrochlorothiazide] Other (See Comments)    hyper  .  Liptruzet [Ezetimibe-Atorvastatin] Other (See Comments)    myalgia  . Statins Other (See Comments)    myalgia    Past Medical History:  Diagnosis Date  . Anemia   . Atrial fibrillation ([place])   . Cataract   . Cellulitis    In past  . Coronary artery disease    5 bypasses  . DJD (degenerative joint disease)   . Ejection fraction < 50%    Mildly reduced, 40% by echo  . GERD (gastroesophageal reflux disease)   . Hyperlipidemia   . Hypertension   . Persistent atrial fibrillation ([place])   . Prostate hypertrophy    on CT scan 09/2014  . PUD (peptic ulcer disease)   . Skin cancer of eyelid    Resected    Social History   Social History  . Marital status: Married    Spouse name: N/A  . Number of children: N/A  . Years of education: N/A   Occupational History  . retired    Social History Main Topics  . Smoking status: Former Smoker    Packs/day: 2.00    Years: 5.00    Types: Cigarettes    Quit date: 12/23/1963  . Smokeless tobacco: Never Used  . Alcohol use No  . Drug use: No  . Sexual activity:  Yes   Other Topics Concern  . Not on file   Social History Narrative   Lives in a [place] home.     Family History  Problem Relation Age of Onset  . Stroke Mother   . Stroke Father   . Hypertension Father   . Early death Brother        17 months old  . Cancer Brother        lung  . Stroke Brother        heat     Review of Systems: General: negative for chills, fever, night sweats or weight changes.  Cardiovascular: negative for chest pain, dyspnea on exertion, edema, orthopnea, palpitations, paroxysmal nocturnal dyspnea or shortness of breath Dermatological: negative for rash Respiratory: negative for cough or wheezing Urologic: negative for hematuria Abdominal: negative for nausea, vomiting, diarrhea, bright red blood per rectum, melena, or hematemesis Neurologic: negative for visual changes, syncope, or dizziness Lt hip DJD-steroids All other systems  reviewed and are otherwise negative except as noted above.    Blood pressure 136/70, pulse 67, height 5\' 7"  (1.702 m), weight 192 lb (87.1 kg).  General appearance: alert, cooperative and no distress Neck: no JVD and Lt carotid bruit Lungs: few crackles Rt base Heart: irregularly irregular rhythm Extremities: no edema Skin: Skin color, texture, turgor normal. No rashes or lesions Neurologic: Grossly normal  EKG AF with VR-67  ASSESSMENT AND PLAN:   Chest pain with moderate risk of acute coronary syndrome Pt had chest pain "tightness" two weeks ago- he declined to go to the ED. He has had none since  Atrial fibrillation, persistent (HCC) CAF- rate control and anticoagulation  Long term current use of anticoagulant therapy Couamdin   PLAN  I suggested we proceed with an echo and a Myoview. I'll also order carotid dopplers (new LCA bruit). F/U with Dr Percival Spanish in [place] after this.   Kerin Ransom PA-C 03/29/2017 2:38 PM

## 2017-04-05 ENCOUNTER — Encounter (HOSPITAL_COMMUNITY)
Admission: RE | Admit: 2017-04-05 | Discharge: 2017-04-05 | Disposition: A | Discharge status: Home / Self Care | Payer: Medicare Other | Source: Ambulatory Visit | Attending: Cardiology | Admitting: Cardiology

## 2017-04-05 ENCOUNTER — Encounter (HOSPITAL_COMMUNITY): Payer: Self-pay

## 2017-04-05 ENCOUNTER — Ambulatory Visit (HOSPITAL_COMMUNITY)
Admission: RE | Admit: 2017-04-05 | Discharge: 2017-04-05 | Disposition: A | Discharge status: Home / Self Care | Payer: Medicare Other | Source: Ambulatory Visit | Attending: Cardiology | Admitting: Cardiology

## 2017-04-05 ENCOUNTER — Encounter (HOSPITAL_BASED_OUTPATIENT_CLINIC_OR_DEPARTMENT_OTHER)
Admission: RE | Admit: 2017-04-05 | Discharge: 2017-04-05 | Disposition: A | Discharge status: Home / Self Care | Payer: Medicare Other | Source: Ambulatory Visit | Attending: Cardiology | Admitting: Cardiology

## 2017-04-05 DIAGNOSIS — I6523 Occlusion and stenosis of bilateral carotid arteries: Secondary | ICD-10-CM | POA: Insufficient documentation

## 2017-04-05 DIAGNOSIS — R079 Chest pain, unspecified: Secondary | ICD-10-CM

## 2017-04-05 DIAGNOSIS — I5043 Acute on chronic combined systolic (congestive) and diastolic (congestive) heart failure: Secondary | ICD-10-CM | POA: Insufficient documentation

## 2017-04-05 DIAGNOSIS — R9439 Abnormal result of other cardiovascular function study: Secondary | ICD-10-CM | POA: Insufficient documentation

## 2017-04-05 DIAGNOSIS — I340 Nonrheumatic mitral (valve) insufficiency: Secondary | ICD-10-CM | POA: Diagnosis not present

## 2017-04-05 DIAGNOSIS — I517 Cardiomegaly: Secondary | ICD-10-CM | POA: Diagnosis not present

## 2017-04-05 LAB — NM MYOCAR MULTI W/SPECT W/WALL MOTION / EF
LV dias vol: 136 mL (ref 62–150)
LV sys vol: 76 mL
Peak HR: 90 {beats}/min
RATE: 0.31
Rest HR: 72 {beats}/min
SDS: 1
SRS: 4
SSS: 5
TID: 0.9

## 2017-04-05 MED ORDER — REGADENOSON 0.4 MG/5ML IV SOLN
INTRAVENOUS | Status: AC
  Administered 2017-04-05: 0.4 mg via INTRAVENOUS
  Filled 2017-04-05: qty 5

## 2017-04-05 MED ORDER — SODIUM CHLORIDE 0.9% FLUSH
INTRAVENOUS | Status: AC
  Filled 2017-04-05: qty 180

## 2017-04-05 MED ORDER — TECHNETIUM TC 99M TETROFOSMIN IV KIT
10.0000 | PACK | Freq: Once | INTRAVENOUS | Status: AC | PRN
  Administered 2017-04-05: 10 via INTRAVENOUS

## 2017-04-05 MED ORDER — SODIUM CHLORIDE 0.9% FLUSH
INTRAVENOUS | Status: AC
  Administered 2017-04-05: 10 mL via INTRAVENOUS
  Filled 2017-04-05: qty 10

## 2017-04-05 MED ORDER — TECHNETIUM TC 99M TETROFOSMIN IV KIT
30.0000 | PACK | Freq: Once | INTRAVENOUS | Status: AC | PRN
  Administered 2017-04-05: 30 via INTRAVENOUS

## 2017-04-05 NOTE — Progress Notes (Signed)
*  PRELIMINARY RESULTS* Echocardiogram 2D Echocardiogram has been performed.  Samuel Germany 04/05/2017, 11:18 AM

## 2017-04-06 ENCOUNTER — Telehealth: Payer: Self-pay | Admitting: Cardiology

## 2017-04-06 NOTE — Telephone Encounter (Signed)
See result note.  

## 2017-04-06 NOTE — Telephone Encounter (Signed)
New message     Mrs Stafford was returning your call for echo test

## 2017-04-10 ENCOUNTER — Other Ambulatory Visit: Payer: Self-pay | Admitting: Family Medicine

## 2017-04-21 ENCOUNTER — Ambulatory Visit: Payer: Medicare Other | Admitting: Family Medicine

## 2017-04-23 ENCOUNTER — Other Ambulatory Visit: Payer: Self-pay | Admitting: Family Medicine

## 2017-04-26 ENCOUNTER — Ambulatory Visit: Payer: Medicare Other | Admitting: Family Medicine

## 2017-05-05 DIAGNOSIS — H5203 Hypermetropia, bilateral: Secondary | ICD-10-CM | POA: Diagnosis not present

## 2017-05-05 DIAGNOSIS — H25811 Combined forms of age-related cataract, right eye: Secondary | ICD-10-CM | POA: Diagnosis not present

## 2017-05-05 DIAGNOSIS — H52223 Regular astigmatism, bilateral: Secondary | ICD-10-CM | POA: Diagnosis not present

## 2017-05-05 DIAGNOSIS — H524 Presbyopia: Secondary | ICD-10-CM | POA: Diagnosis not present

## 2017-05-17 ENCOUNTER — Encounter: Payer: Self-pay | Admitting: Cardiology

## 2017-05-21 ENCOUNTER — Other Ambulatory Visit: Payer: Self-pay | Admitting: Family Medicine

## 2017-06-01 NOTE — Progress Notes (Signed)
HPI The patient presents for followup of his coronary disease and atrial fibrillation.  Since I last saw him he had some chest tightness that improved with increased Lasix.  He did have a follow up [employer]. That demonstrated infarct but no ischemia.  EF on echo was 40 - 45%.  He returns for follow up.  He has done quite well.  The patient denies any new symptoms such as chest discomfort, neck or arm discomfort. There has been no new shortness of breath, PND or orthopnea. There have been no reported palpitations, presyncope or syncope.    Allergies  Allergen Reactions  . Penicillins Hives and Rash  . Hct [Hydrochlorothiazide] Other (See Comments)    hyper  . Liptruzet [Ezetimibe-Atorvastatin] Other (See Comments)    myalgia  . Statins Other (See Comments)    myalgia    Current Outpatient Prescriptions  Medication Sig Dispense Refill  . acetaminophen (TYLENOL) 650 MG CR tablet Take 650 mg by mouth every 8 (eight) hours as needed for pain.     . carvedilol (COREG) 12.5 MG tablet Take 12.5 mg by mouth. Take 1 1/2 tablets by mouth every morning and take 1 tablet by mouth every evening    . cholecalciferol (VITAMIN D) 400 UNITS TABS tablet Take 1,000 Units by mouth.    . diltiazem (CARDIZEM CD) 120 MG 24 hr capsule Take 120 mg by mouth daily.    Marland Kitchen ezetimibe (ZETIA) 10 MG tablet Take 10 mg by mouth daily.    . finasteride (PROSCAR) 5 MG tablet Take 5 mg by mouth daily.    . furosemide (LASIX) 20 MG tablet Take 20 mg by mouth daily.    Marland Kitchen lovastatin (MEVACOR) 20 MG tablet Take 1 tablet (20 mg total) by mouth at bedtime. 90 tablet 1  . Polyethyl Glycol-Propyl Glycol (SYSTANE) 0.4-0.3 % GEL ophthalmic gel Place 1 application into both eyes as needed.     . warfarin (COUMADIN) 5 MG tablet TAKE ONE (1) TABLET EACH DAY 90 tablet 0   No current facility-administered medications for this visit.     Past Medical History:  Diagnosis Date  . Anemia   . Atrial fibrillation ([place])   .  Cataract   . Cellulitis    In past  . Coronary artery disease    5 bypasses  . DJD (degenerative joint disease)   . Ejection fraction < 50%    Mildly reduced, 40% by echo  . GERD (gastroesophageal reflux disease)   . Hyperlipidemia   . Hypertension   . Persistent atrial fibrillation ([place])   . Prostate hypertrophy    on CT scan 09/2014  . PUD (peptic ulcer disease)   . Skin cancer of eyelid    Resected    Past Surgical History:  Procedure Laterality Date  . APPENDECTOMY    . BYPASS GRAFT  2005  . CATARACT EXTRACTION W/PHACO Left 07/22/2015   Procedure: CATARACT EXTRACTION PHACO AND INTRAOCULAR LENS PLACEMENT LEFT EYE CDE=8.14;  Surgeon: Tonny Branch, MD;  Location: [facility];  Service: Ophthalmology;  Laterality: Left;  . CORONARY ARTERY BYPASS GRAFT  4/05   LIMA to LAD, SVG to PDA, SVG to ramus intermediate  . EYE SURGERY  07/2015  . EYELID CARCINOMA EXCISION     Skin cancer resected  . Heart bypass  2005  . TOTAL HIP ARTHROPLASTY     Right  . TOTAL KNEE ARTHROPLASTY     Right     ROS:  As stated in  the HPI and negative for all other systems.  PHYSICAL EXAM BP 132/80   Pulse 80   Ht 5\' 7"  (1.702 m)   Wt 193 lb (87.5 kg)   BMI 30.23 kg/m   GENERAL:  Well appearing NECK:  No jugular venous distention, waveform within normal limits, carotid upstroke brisk and symmetric, no bruits, no thyromegaly LUNGS:  Clear to auscultation bilaterally BACK:  No CVA tenderness CHEST:  Well healed sternotomy scar. HEART:  PMI not displaced or sustained,S1 and S2 within normal limits, no S3, no S4, no clicks, no rubs, no murmurs ABD:  Flat, positive bowel sounds normal in frequency in pitch, no bruits, no rebound, no guarding, no midline pulsatile mass, no hepatomegaly, no splenomegaly EXT:  2 plus pulses throughout, no edema, no cyanosis no clubbing   ASSESSMENT AND PLAN  [name]:  He had a Lexiscan Myoview this year without ischemia.  No change in therapy is planned.   CM:  He has a  mildly reduced EF.  He has no symptoms.  No change in therapy is planned.   HTN:  The blood pressure is at target. No change in medications is indicated. We will continue with therapeutic lifestyle changes (TLC).  HYPERLIPIDEMIA:  This is followed closely by Dr. Laurance Flatten. He is intolerant of higher dose statin.  I will defer to Dr. Laurance Flatten.  Lab Results  Component Value Date   CHOL 250 (H) 11/19/2016   TRIG 180 (H) 11/19/2016   HDL 50 11/19/2016   LDLCALC 164 (H) 11/19/2016    ATRIAL FIBRILLATION:   Mr. ESTLE HUGULEY has a CHA2DS2 - VASc score of 4.   He tolerates Warfarin and is not interested in switching.   CAROTID STENOSIS:  He had a Doppler this year with less than 50% left ICA stenosis.  I will follow this with repeat carotid in a few years.

## 2017-06-02 ENCOUNTER — Ambulatory Visit (INDEPENDENT_AMBULATORY_CARE_PROVIDER_SITE_OTHER): Payer: Medicare Other | Admitting: Cardiology

## 2017-06-02 ENCOUNTER — Encounter: Payer: Self-pay | Admitting: *Deleted

## 2017-06-02 ENCOUNTER — Encounter: Payer: Self-pay | Admitting: Cardiology

## 2017-06-02 VITALS — BP 132/80 | HR 80 | Ht 67.0 in | Wt 193.0 lb

## 2017-06-02 DIAGNOSIS — I10 Essential (primary) hypertension: Secondary | ICD-10-CM | POA: Diagnosis not present

## 2017-06-02 DIAGNOSIS — I2510 Atherosclerotic heart disease of native coronary artery without angina pectoris: Secondary | ICD-10-CM | POA: Diagnosis not present

## 2017-06-02 DIAGNOSIS — I779 Disorder of arteries and arterioles, unspecified: Secondary | ICD-10-CM | POA: Diagnosis not present

## 2017-06-02 DIAGNOSIS — I739 Peripheral vascular disease, unspecified: Secondary | ICD-10-CM

## 2017-06-02 NOTE — Patient Instructions (Signed)

## 2017-06-09 ENCOUNTER — Encounter: Payer: Self-pay | Admitting: Family Medicine

## 2017-06-09 ENCOUNTER — Ambulatory Visit (INDEPENDENT_AMBULATORY_CARE_PROVIDER_SITE_OTHER): Payer: Medicare Other | Admitting: Family Medicine

## 2017-06-09 VITALS — BP 143/85 | HR 87 | Temp 97.0°F | Ht 67.0 in | Wt 190.0 lb

## 2017-06-09 DIAGNOSIS — E7800 Pure hypercholesterolemia, unspecified: Secondary | ICD-10-CM | POA: Diagnosis not present

## 2017-06-09 DIAGNOSIS — Z7689 Persons encountering health services in other specified circumstances: Secondary | ICD-10-CM | POA: Diagnosis not present

## 2017-06-09 DIAGNOSIS — E8881 Metabolic syndrome: Secondary | ICD-10-CM

## 2017-06-09 DIAGNOSIS — I10 Essential (primary) hypertension: Secondary | ICD-10-CM | POA: Diagnosis not present

## 2017-06-09 DIAGNOSIS — Z7901 Long term (current) use of anticoagulants: Secondary | ICD-10-CM

## 2017-06-09 DIAGNOSIS — I700 Atherosclerosis of aorta: Secondary | ICD-10-CM | POA: Diagnosis not present

## 2017-06-09 DIAGNOSIS — N400 Enlarged prostate without lower urinary tract symptoms: Secondary | ICD-10-CM

## 2017-06-09 DIAGNOSIS — I481 Persistent atrial fibrillation: Secondary | ICD-10-CM

## 2017-06-09 DIAGNOSIS — E559 Vitamin D deficiency, unspecified: Secondary | ICD-10-CM

## 2017-06-09 DIAGNOSIS — K219 Gastro-esophageal reflux disease without esophagitis: Secondary | ICD-10-CM

## 2017-06-09 DIAGNOSIS — I4819 Other persistent atrial fibrillation: Secondary | ICD-10-CM

## 2017-06-09 LAB — COAGUCHEK XS/INR WAIVED
INR: 2.9 — AB (ref 0.9–1.1)
Prothrombin Time: 34.7 s

## 2017-06-09 NOTE — Patient Instructions (Addendum)
Medicare Annual Wellness Visit  [facility] and the medical providers at [facility] strive to bring you the best medical care.  In doing so we not only want to address your current medical conditions and concerns but also to detect new conditions early and prevent illness, disease and health-related problems.    Medicare offers a yearly Wellness Visit which allows our clinical staff to assess your need for preventative services including immunizations, lifestyle education, counseling to decrease risk of preventable diseases and screening for fall risk and other medical concerns.    This visit is provided free of charge (no copay) for all Medicare recipients. The clinical pharmacists at [facility] have begun to conduct these Wellness Visits which will also include a thorough review of all your medications.    As you primary medical provider recommend that you make an appointment for your Annual Wellness Visit if you have not done so already this year.  You may set up this appointment before you leave today or you may call back ([phone]) and schedule an appointment.  Please make sure when you call that you mention that you are scheduling your Annual Wellness Visit with the clinical pharmacist so that the appointment may be made for the proper length of time.     Continue current medications. Continue good therapeutic lifestyle changes which include good diet and exercise. Fall precautions discussed with patient. If an FOBT was given today- please return it to our front desk. If you are over 55 years old - you may need Prevnar 31 or the adult Pneumonia vaccine.  **Flu shots are available--- please call and schedule a FLU-CLINIC appointment**  After your visit with [place] today you will receive a survey in the mail or online from [employer] regarding your care with [place]. Please take a moment to fill this out. Your feedback is very  important to [place] as you can help [place] better understand your patient needs as well as improve your experience and satisfaction. WE CARE ABOUT YOU!!!   Follow-up with cardiology as planned Watch sodium intake and check weights daily if possible at the same time If your weight stays increase in you develop shortness of breath he should probably take an extra Lasix for a day or so and then go back to your regular dosage Follow-up with urology as planned Continue to monitor blood pressures at home and try to keep them at 140 or less for the top number and less than or near 80 for the bottom number Follow-up with orthopedist for left hip pain as needed Use warm wet compresses to left neck 20 minutes 3 or 4 times daily until this pain completely resolves Continue Coumadin as doing and recheck pro time in 6 weeks  Anticoagulation Warfarin Dose Instructions as of 06/09/2017      Dorene Grebe Tue Wed Thu Fri Sat   New Dose 5 mg 2.5 mg 5 mg 5 mg 5 mg 5 mg 5 mg    Description   Continue current warfarin 5mg  dose - take 1/2 tablet on mondays.  Take 1 tablet all other days  INR was 2.9 today

## 2017-06-09 NOTE — Progress Notes (Signed)
Subjective:    Patient ID: [name], male    DOB: Jun 28, 1932, 83 y.o.   MRN: [id number]  HPI Pt here for follow up and management of chronic medical problems which includes hypertension, a fib and hyperlipidemia. He is taking medication regularly.The patient complains of neck stiffness on the left side. He has ongoing left hip pain and is followed by the orthopedic specialist for this. He is to be given an FOBT card today and will get lab work today. Patient recently saw the cardiologist and had a workup the cause of chest discomfort and the workup was good including a nuclear medicine myocardial perfusion scan. This was reviewed. The patient denies any chest pain or shortness of breath. He had been picking some blackberries and developed back pain and left shoulder pain and neck pain and developed shortness of breath after that and that is when he went and had the cardiologist see him and had the negative workup. The neck pain is getting better. He still follows with the orthopedist for the ongoing hip pain. Since that time he denies any chest pain or shortness of breath anymore than usual. He denies any trouble with nausea vomiting diarrhea blood in the stool or black tarry bowel movements. He sees Dr. Jeffie Pollock regularly for his rectal exam. He had his eye exam about 2 months ago at my eye doctor. A repeat blood pressure today was 143/85 and his pro time was 2.9 and he will continue with his current Coumadin dosage and we will make sure that this gets rechecked in about 4 weeks.      Patient Active Problem List   Diagnosis Date Noted  . Left-sided carotid artery disease ([place]) 06/02/2017  . Coronary artery disease involving native coronary artery of native heart without angina pectoris 06/02/2017  . Chest pain with moderate risk of acute coronary syndrome 03/29/2017  . Right hip pain 02/12/2016  . Metabolic syndrome 17/40/8144  . Acute blood loss anemia 01/22/2015  . Urinary retention  01/21/2015  . Cataract of both eyes 05/25/2014  . Long term current use of anticoagulant therapy 02/14/2013  . OVERWEIGHT 12/11/2009  . ANEMIA 02/25/2009  . Essential hypertension 02/25/2009  . Coronary atherosclerosis 02/25/2009  . Atrial fibrillation, persistent ([place]) 02/25/2009  . GERD 02/25/2009  . DEGENERATIVE JOINT DISEASE 02/25/2009   Outpatient Encounter Prescriptions as of 06/09/2017  Medication Sig  . acetaminophen (TYLENOL) 650 MG CR tablet Take 650 mg by mouth every 8 (eight) hours as needed for pain.   . carvedilol (COREG) 12.5 MG tablet Take 12.5 mg by mouth. Take 1 1/2 tablets by mouth every morning and take 1 tablet by mouth every evening  . cholecalciferol (VITAMIN D) 400 UNITS TABS tablet Take 1,000 Units by mouth.  . diltiazem (CARDIZEM CD) 120 MG 24 hr capsule Take 120 mg by mouth daily.  Marland Kitchen ezetimibe (ZETIA) 10 MG tablet Take 10 mg by mouth daily.  . finasteride (PROSCAR) 5 MG tablet Take 5 mg by mouth daily.  . furosemide (LASIX) 20 MG tablet Take 20 mg by mouth daily.  Marland Kitchen lovastatin (MEVACOR) 20 MG tablet Take 1 tablet (20 mg total) by mouth at bedtime.  Vladimir Faster Glycol-Propyl Glycol (SYSTANE) 0.4-0.3 % GEL ophthalmic gel Place 1 application into both eyes as needed.   . warfarin (COUMADIN) 5 MG tablet TAKE ONE (1) TABLET EACH DAY   No facility-administered encounter medications on file as of 06/09/2017.      Review of Systems  Constitutional: Negative.  HENT: Negative.   Eyes: Negative.   Respiratory: Negative.   Cardiovascular: Negative.   Gastrointestinal: Negative.   Endocrine: Negative.   Genitourinary: Negative.   Musculoskeletal: Positive for arthralgias (on-going left hip pain) and neck stiffness (left side ).  Skin: Negative.   Allergic/Immunologic: Negative.   Neurological: Negative.   Hematological: Negative.   Psychiatric/Behavioral: Negative.        Objective:   Physical Exam  Constitutional: He is oriented to person, place, and time.  He appears well-developed and well-nourished.  The patient is pleasant and alert and calm  HENT:  Head: Normocephalic and atraumatic.  Right Ear: External ear normal.  Left Ear: External ear normal.  Mouth/Throat: Oropharynx is clear and moist. No oropharyngeal exudate.  Slight nasal congestion bilaterally  Eyes: Pupils are equal, round, and reactive to light. Conjunctivae and EOM are normal. Right eye exhibits no discharge. Left eye exhibits no discharge. No scleral icterus.  Recent eye exam was good according to the patient at my eye doctor  Neck: Normal range of motion. Neck supple. No thyromegaly present.  No bruits thyromegaly or anterior cervical adenopathy  Cardiovascular: Normal rate, normal heart sounds and intact distal pulses.   No murmur heard. The heart was irregular at 72/m  Pulmonary/Chest: Effort normal and breath sounds normal. No respiratory distress. He has no wheezes. He has no rales. He exhibits no tenderness.  Clear anteriorly and posteriorly no axillary adenopathy  Abdominal: Soft. Bowel sounds are normal. He exhibits no mass. There is no tenderness. There is no rebound and no guarding.  Soft without masses tenderness or organ enlargement or bruits and no inguinal adenopathy  Genitourinary:  Genitourinary Comments: Is followed regularly by urology and Dr. Jeffie Pollock  Musculoskeletal: Normal range of motion. He exhibits no edema or tenderness.  Lymphadenopathy:    He has no cervical adenopathy.  Neurological: He is alert and oriented to person, place, and time. He has normal reflexes. No cranial nerve deficit.  Skin: Skin is warm and dry. No rash noted.  Psychiatric: He has a normal mood and affect. His behavior is normal. Judgment and thought content normal.  Nursing note and vitals reviewed.  BP (!) 158/99 (BP Location: Right Arm)   Pulse 83   Temp (!) 97 F (36.1 C) (Oral)   Ht 5' 7"  (1.702 m)   Wt 190 lb (86.2 kg)   BMI 29.76 kg/m         Assessment &  Plan:  1. Essential hypertension -The blood pressure is slightly elevated today and on the second reading was closer to a normal reading but still elevated and we will not make any changes in his medicines today. - BMP8+EGFR - CBC with Differential/Platelet - Hepatic function panel  2. Vitamin D deficiency -Continue vitamin D replacement pending results of lab work - CBC with Differential/Platelet - VITAMIN D 25 Hydroxy (Vit-D Deficiency, Fractures)  3. Benign prostatic hyperplasia, unspecified whether lower urinary tract symptoms present -Follow-up with urology as planned - CBC with Differential/Platelet  4. Thoracic aortic atherosclerosis ([place]) -Continue with Crestor and Zetia and aggressive therapeutic lifestyle changes - CBC with Differential/Platelet - Lipid panel  5. Atrial fibrillation, persistent ([place]) -Follow-up with cardiology as planned - CBC with Differential/Platelet  6. Metabolic syndrome -Watch diet closely, redo sugar and get exercise during a cool parts of the day - BMP8+EGFR - CBC with Differential/Platelet  7. Gastroesophageal reflux disease, esophagitis presence not specified -No complaints with this today and patient will continue to watch his  diet closely. - CBC with Differential/Platelet - Hepatic function panel  8. Pure hypercholesterolemia -Continue with therapeutic lifestyle changes and Crestor and Zetia as tolerated - CBC with Differential/Platelet - Lipid panel  Patient Instructions                       Medicare Annual Wellness Visit   and the medical providers at [facility] strive to bring you the best medical care.  In doing so we not only want to address your current medical conditions and concerns but also to detect new conditions early and prevent illness, disease and health-related problems.    Medicare offers a yearly Wellness Visit which allows our clinical staff to assess your need for preventative  services including immunizations, lifestyle education, counseling to decrease risk of preventable diseases and screening for fall risk and other medical concerns.    This visit is provided free of charge (no copay) for all Medicare recipients. The clinical pharmacists at [facility] have begun to conduct these Wellness Visits which will also include a thorough review of all your medications.    As you primary medical provider recommend that you make an appointment for your Annual Wellness Visit if you have not done so already this year.  You may set up this appointment before you leave today or you may call back ([phone]) and schedule an appointment.  Please make sure when you call that you mention that you are scheduling your Annual Wellness Visit with the clinical pharmacist so that the appointment may be made for the proper length of time.     Continue current medications. Continue good therapeutic lifestyle changes which include good diet and exercise. Fall precautions discussed with patient. If an FOBT was given today- please return it to our front desk. If you are over 40 years old - you may need Prevnar 90 or the adult Pneumonia vaccine.  **Flu shots are available--- please call and schedule a FLU-CLINIC appointment**  After your visit with [place] today you will receive a survey in the mail or online from [employer] regarding your care with [place]. Please take a moment to fill this out. Your feedback is very important to [place] as you can help [place] better understand your patient needs as well as improve your experience and satisfaction. WE CARE ABOUT YOU!!!   Follow-up with cardiology as planned Watch sodium intake and check weights daily if possible at the same time If your weight stays increase in you develop shortness of breath he should probably take an extra Lasix for a day or so and then go back to your regular dosage Follow-up with urology as planned Continue to monitor  blood pressures at home and try to keep them at 140 or less for the top number and less than or near 80 for the bottom number Follow-up with orthopedist for left hip pain as needed Use warm wet compresses to left neck 20 minutes 3 or 4 times daily until this pain completely resolves Continue Coumadin as doing and recheck pro time in 6 weeks    Arrie Senate MD

## 2017-06-10 LAB — HEPATIC FUNCTION PANEL
ALT: 14 IU/L (ref 0–44)
AST: 16 IU/L (ref 0–40)
Albumin: 4.3 g/dL (ref 3.5–4.7)
Alkaline Phosphatase: 57 IU/L (ref 39–117)
BILIRUBIN TOTAL: 0.5 mg/dL (ref 0.0–1.2)
BILIRUBIN, DIRECT: 0.17 mg/dL (ref 0.00–0.40)
TOTAL PROTEIN: 6.5 g/dL (ref 6.0–8.5)

## 2017-06-10 LAB — BMP8+EGFR
BUN / CREAT RATIO: 12 (ref 10–24)
BUN: 13 mg/dL (ref 8–27)
CALCIUM: 9.5 mg/dL (ref 8.6–10.2)
CHLORIDE: 103 mmol/L (ref 96–106)
CO2: 26 mmol/L (ref 20–29)
Creatinine, Ser: 1.09 mg/dL (ref 0.76–1.27)
GFR calc Af Amer: 73 mL/min/{1.73_m2} (ref >59)
GFR calc non Af Amer: 63 mL/min/{1.73_m2} (ref >59)
Glucose: 100 mg/dL — ABNORMAL HIGH (ref 65–99)
POTASSIUM: 4.6 mmol/L (ref 3.5–5.2)
SODIUM: 144 mmol/L (ref 134–144)

## 2017-06-10 LAB — CBC WITH DIFFERENTIAL/PLATELET
BASOS ABS: 0 10*3/uL (ref 0.0–0.2)
Basos: 0 %
EOS (ABSOLUTE): 0.6 10*3/uL — AB (ref 0.0–0.4)
Eos: 4 %
Hematocrit: 42.3 % (ref 37.5–51.0)
Hemoglobin: 13.8 g/dL (ref 13.0–17.7)
Immature Grans (Abs): 0 10*3/uL (ref 0.0–0.1)
Immature Granulocytes: 0 %
LYMPHS ABS: 5.2 10*3/uL — AB (ref 0.7–3.1)
Lymphs: 38 %
MCH: 32.2 pg (ref 26.6–33.0)
MCHC: 32.6 g/dL (ref 31.5–35.7)
MCV: 99 fL — AB (ref 79–97)
Monocytes Absolute: 0.6 10*3/uL (ref 0.1–0.9)
Monocytes: 5 %
Neutrophils Absolute: 7.1 10*3/uL — ABNORMAL HIGH (ref 1.4–7.0)
Neutrophils: 53 %
PLATELETS: 255 10*3/uL (ref 150–379)
RBC: 4.29 x10E6/uL (ref 4.14–5.80)
RDW: 13.4 % (ref 12.3–15.4)
WBC: 13.6 10*3/uL — ABNORMAL HIGH (ref 3.4–10.8)

## 2017-06-10 LAB — LIPID PANEL
CHOLESTEROL TOTAL: 175 mg/dL (ref 100–199)
Chol/HDL Ratio: 3.6 ratio (ref 0.0–5.0)
HDL: 49 mg/dL (ref >39)
LDL CALC: 97 mg/dL (ref 0–99)
Triglycerides: 144 mg/dL (ref 0–149)
VLDL Cholesterol Cal: 29 mg/dL (ref 5–40)

## 2017-06-10 LAB — VITAMIN D 25 HYDROXY (VIT D DEFICIENCY, FRACTURES): Vit D, 25-Hydroxy: 33.4 ng/mL (ref 30.0–100.0)

## 2017-06-11 ENCOUNTER — Telehealth: Payer: Self-pay | Admitting: Family Medicine

## 2017-06-11 NOTE — Telephone Encounter (Signed)
Patients wife aware of results and verbalizes understanding.

## 2017-06-15 ENCOUNTER — Encounter: Payer: Self-pay | Admitting: *Deleted

## 2017-06-16 ENCOUNTER — Telehealth: Payer: Self-pay | Admitting: Family Medicine

## 2017-06-16 LAB — PSA, TOTAL AND FREE
PSA FREE PCT: 13.9 %
PSA FREE: 1.18 ng/mL
Prostate Specific Ag, Serum: 8.5 ng/mL — ABNORMAL HIGH (ref 0.0–4.0)

## 2017-06-16 LAB — SPECIMEN STATUS REPORT

## 2017-06-16 NOTE — Telephone Encounter (Signed)
Wife wanted to know PSA = still pending - will check with the lab

## 2017-06-18 ENCOUNTER — Other Ambulatory Visit: Payer: Medicare Other

## 2017-06-18 ENCOUNTER — Other Ambulatory Visit: Payer: Self-pay | Admitting: Family Medicine

## 2017-06-18 DIAGNOSIS — Z1211 Encounter for screening for malignant neoplasm of colon: Secondary | ICD-10-CM | POA: Diagnosis not present

## 2017-06-22 LAB — FECAL OCCULT BLOOD, IMMUNOCHEMICAL: Fecal Occult Bld: NEGATIVE

## 2017-06-22 NOTE — Progress Notes (Signed)
History of ulcerative colitis. Last seen over 2 years ago.

## 2017-07-05 ENCOUNTER — Other Ambulatory Visit: Payer: Self-pay | Admitting: Family Medicine

## 2017-07-21 ENCOUNTER — Ambulatory Visit (INDEPENDENT_AMBULATORY_CARE_PROVIDER_SITE_OTHER): Payer: Medicare Other | Admitting: *Deleted

## 2017-07-21 DIAGNOSIS — Z7901 Long term (current) use of anticoagulants: Secondary | ICD-10-CM | POA: Diagnosis not present

## 2017-07-21 DIAGNOSIS — I4819 Other persistent atrial fibrillation: Secondary | ICD-10-CM

## 2017-07-21 DIAGNOSIS — I481 Persistent atrial fibrillation: Secondary | ICD-10-CM

## 2017-07-21 DIAGNOSIS — Z23 Encounter for immunization: Secondary | ICD-10-CM

## 2017-07-21 LAB — COAGUCHEK XS/INR WAIVED
INR: 4.7 — ABNORMAL HIGH (ref 0.9–1.1)
PROTHROMBIN TIME: 55.9 s

## 2017-07-21 NOTE — Progress Notes (Signed)
Subjective:     Indication: atrial fibrillation Bleeding signs/symptoms: None Thromboembolic signs/symptoms: None  Missed/Extra Coumadin doses: Took 1 whole tablet on Monday instead of a 1/2 as directed Medication changes: no Dietary changes: no Bacterial/viral infection: no Other concerns: no  The following portions of the patient's history were reviewed and updated as appropriate: allergies and current medications.  Review of Systems Pertinent items are noted in HPI.   Objective:    INR Today: 4.7  Current dose: 5mg  daily except for 2.5 mg on Mondays    Assessment:    Supratherapeutic INR for goal of 2-3   Plan:    1. New dose: Skip today's dose and adjust to 2.5 mg on Monday and Friday and  5 mg all other days 2. Next INR: 1 month    Discussed with Dr [name]  Chong Sicilian, RN

## 2017-07-21 NOTE — Patient Instructions (Signed)
Anticoagulation Warfarin Dose Instructions as of 07/21/2017      [name] Tue Wed Thu Fri Sat   New Dose 5 mg 2.5 mg 5 mg 0 mg 5 mg 2.5 mg 5 mg    Description   Skip today's dose of coumadin and then start taking 1/2 tablet on Monday and Friday and 1 whole tablet all other days.   Your INR was 4.7 today, which is too thin.    Return on 08/25/17 at 8:15

## 2017-07-26 ENCOUNTER — Other Ambulatory Visit: Payer: Self-pay | Admitting: Family Medicine

## 2017-07-31 DIAGNOSIS — M1612 Unilateral primary osteoarthritis, left hip: Secondary | ICD-10-CM | POA: Diagnosis not present

## 2017-08-25 ENCOUNTER — Ambulatory Visit (INDEPENDENT_AMBULATORY_CARE_PROVIDER_SITE_OTHER): Payer: Medicare Other | Admitting: *Deleted

## 2017-08-25 DIAGNOSIS — Z7901 Long term (current) use of anticoagulants: Secondary | ICD-10-CM | POA: Diagnosis not present

## 2017-08-25 DIAGNOSIS — I481 Persistent atrial fibrillation: Secondary | ICD-10-CM

## 2017-08-25 DIAGNOSIS — I4819 Other persistent atrial fibrillation: Secondary | ICD-10-CM

## 2017-08-25 LAB — COAGUCHEK XS/INR WAIVED
INR: 2.7 — AB (ref 0.9–1.1)
PROTHROMBIN TIME: 32.7 s

## 2017-08-25 NOTE — Progress Notes (Signed)
Subjective:     Indication: atrial fibrillation Bleeding signs/symptoms: None Thromboembolic signs/symptoms: None  Missed Coumadin doses: None Medication changes: no Dietary changes: no Bacterial/viral infection: no Other concerns: no  The following portions of the patient's history were reviewed and updated as appropriate: allergies and current medications.  Review of Systems Pertinent items are noted in HPI.   Objective:    INR Today: 2.7 Current dose: 2.5 mg M, W, F and 5 mg all other days    Assessment:    Therapeutic INR for goal of 2-3   Plan:    1. New dose: no change   2. Next INR: 1 month    Chong Sicilian, RN

## 2017-08-25 NOTE — Progress Notes (Signed)
Discussed at appointment.

## 2017-09-07 DIAGNOSIS — M25552 Pain in left hip: Secondary | ICD-10-CM | POA: Diagnosis not present

## 2017-09-07 DIAGNOSIS — M1612 Unilateral primary osteoarthritis, left hip: Secondary | ICD-10-CM | POA: Diagnosis not present

## 2017-09-09 ENCOUNTER — Telehealth: Payer: Self-pay

## 2017-09-09 NOTE — Telephone Encounter (Signed)
LOV J[facility] 06-02-17\   [facility] Pre-operative Risk Assessment    Request for surgical clearance:  1. What type of surgery is being performed? LEFT HIP THA W/WO AUTOGRAFT.ALLOGRAFT   2. When is this surgery scheduled? PENDING   3. Are there any medications that need to be held prior to surgery and how long?NOT LISTED-TAKES LASIX,COUMADIN   4. Practice name and name of physician performing surgery? [facility]  5. What is your office phone and fax number? [phone] FX [phone]   6. Anesthesia type (None, local, MAC, general) ? NONE ,LISTED   Waylan Rocher 09/09/2017, 7:30 AM  _________________________________________________________________   (provider comments below)  ]

## 2017-09-10 NOTE — Telephone Encounter (Signed)
    Chart reviewed as part of pre-operative protocol coverage. Patient with history of [name] s/p CABG in 2000 with EF documented at 40% in 2010, chronic-appearing atrial fib on warfarin, HTN, HLD. Seen in the summer for chest pain at which time nuclear stress test showed scar but no ischemia, EF decreased to 45-54% with correlating 2d echo 03/2017. Patient last seen 05/2017 by Dr. Percival Spanish and felt to be doing well. Will forward this message to Dr. Percival Spanish to get his input on whether a pre-op appointment is necessary prior to hip surgery for med optimization. (I.e. with regard to LV dysfunction, he is not currently on ACEI/ARB, but is on diltiazem.) Dr. Percival Spanish, please route response to P CV DIV PREOP.  I attempted to contact patient to discuss how he is doing, but got voicemail on both his home phone and voicemail. LMOM to call back. Revised cardiac risk index is calculated at 6.6%, indicating need for further discussion.  Charlie Pitter, PA-C  09/10/2017, 3:34 PM

## 2017-09-10 NOTE — Telephone Encounter (Signed)
No further testing is indicated.  The patient is at acceptable risk for the planned surgery.

## 2017-09-13 NOTE — Telephone Encounter (Signed)
DASI is 6.61 METS. Will route to pharmacy to review Warfarin prior to sending clearance note to surgeon.

## 2017-09-13 NOTE — Telephone Encounter (Signed)
Patient with diagnosis of Afib on warfarin for anticoagulation.    Procedure: hip THA Date of procedure: pending  CHADS2-VASc score of  4 (CHF, HTN, AGE, DM2, stroke/tia x 2, [name], AGE, male)  CrCl 51mL/min   Per office protocol, patient can hold warfarin for 5 days prior to procedure.

## 2017-09-14 NOTE — Telephone Encounter (Signed)
   Chart reviewed as part of pre-operative protocol coverage. Given past medical history and time since last visit, based on ACC/AHA guidelines, [name] would be at acceptable risk for the planned procedure without further cardiovascular testing per Dr. Percival Spanish. Vin Bhagat PA-C spoke with patient and established good functional capacity by Duke Activity score.  Per pharmacist Dr. Patterson Hammersmith, "Per office protocol, patient can hold warfarin for 5 days prior to procedure." Timing of resumption of anticoagulation per surgeon, as soon as felt safe.  I will route this recommendation to the requesting party via Epic fax function and remove from pre-op pool.  Please call with questions.  Charlie Pitter, PA-C 09/14/2017, 7:58 AM

## 2017-09-29 ENCOUNTER — Ambulatory Visit (INDEPENDENT_AMBULATORY_CARE_PROVIDER_SITE_OTHER): Payer: Medicare Other | Admitting: *Deleted

## 2017-09-29 ENCOUNTER — Encounter: Payer: Self-pay | Admitting: Family

## 2017-09-29 ENCOUNTER — Ambulatory Visit (INDEPENDENT_AMBULATORY_CARE_PROVIDER_SITE_OTHER): Payer: Medicare Other | Admitting: Family

## 2017-09-29 VITALS — BP 132/70 | HR 57 | Temp 97.7°F | Ht 67.0 in | Wt 194.0 lb

## 2017-09-29 DIAGNOSIS — J209 Acute bronchitis, unspecified: Secondary | ICD-10-CM

## 2017-09-29 DIAGNOSIS — I481 Persistent atrial fibrillation: Secondary | ICD-10-CM | POA: Diagnosis not present

## 2017-09-29 DIAGNOSIS — Z7901 Long term (current) use of anticoagulants: Secondary | ICD-10-CM | POA: Diagnosis not present

## 2017-09-29 DIAGNOSIS — I4819 Other persistent atrial fibrillation: Secondary | ICD-10-CM

## 2017-09-29 LAB — COAGUCHEK XS/INR WAIVED
INR: 2.5 — AB (ref 0.9–1.1)
Prothrombin Time: 30.4 s

## 2017-09-29 MED ORDER — ALBUTEROL SULFATE HFA 108 (90 BASE) MCG/ACT IN AERS: 2.0000 | INHALATION_SPRAY | Freq: Four times a day (QID) | RESPIRATORY_TRACT | 2 refills | Status: DC | PRN

## 2017-09-29 MED ORDER — PREDNISONE 10 MG (21) PO TBPK: ORAL_TABLET | ORAL | 0 refills | Status: DC

## 2017-09-29 NOTE — Progress Notes (Signed)
   Subjective:    Patient ID: [name], male    DOB: 01-May-1932, 83 y.o.   MRN: [id number]  Cough  This is a new problem. The current episode started 1 to 4 weeks ago. The problem has been unchanged. The problem occurs every few minutes. The cough is productive of sputum and productive of purulent sputum. Associated symptoms include rhinorrhea, shortness of breath and wheezing. Pertinent negatives include no chills, ear congestion, ear pain, fever or myalgias. The symptoms are aggravated by lying down. He has tried rest and OTC cough suppressant for the symptoms. The treatment provided mild relief.      Review of Systems  Constitutional: Negative for chills and fever.  HENT: Positive for rhinorrhea. Negative for ear pain.   Respiratory: Positive for cough, shortness of breath and wheezing.   Musculoskeletal: Negative for myalgias.  All other systems reviewed and are negative.      Objective:   Physical Exam  Constitutional: He is oriented to person, place, and time. He appears well-developed and well-nourished. No distress.  HENT:  Head: Normocephalic.  Right Ear: External ear normal.  Left Ear: External ear normal.  Nose: Nose normal.  Mouth/Throat: Oropharynx is clear and moist.  Eyes: Pupils are equal, round, and reactive to light. Right eye exhibits no discharge. Left eye exhibits no discharge.  Neck: Normal range of motion. Neck supple. No thyromegaly present.  Cardiovascular: Normal rate, regular rhythm, normal heart sounds and intact distal pulses.  No murmur heard. Pulmonary/Chest: Effort normal. No respiratory distress. He has wheezes.  Tight nonproductive cough   Abdominal: Soft. Bowel sounds are normal. He exhibits no distension. There is no tenderness.  Musculoskeletal: Normal range of motion. He exhibits no edema or tenderness.  Neurological: He is alert and oriented to person, place, and time.  Skin: Skin is warm and dry. No rash noted. No erythema.    Psychiatric: He has a normal mood and affect. His behavior is normal. Judgment and thought content normal.  Vitals reviewed.     BP 132/70   Pulse (!) 57   Temp 97.7 F (36.5 C)   Ht 5\' 7"  (1.702 m)   Wt 194 lb (88 kg)   SpO2 98%   BMI 30.38 kg/m      Assessment & Plan:  1. Acute bronchitis, unspecified organism - Take meds as prescribed - Use a cool mist humidifier  -Force fluids -For any cough or congestion  Use plain Mucinex- regular strength or max strength is fine -For fever or aces or pains- take tylenol or ibuprofen appropriate for age and weight-Throat lozenges if help - albuterol (PROVENTIL HFA;VENTOLIN HFA) 108 (90 Base) MCG/ACT inhaler; Inhale 2 puffs into the lungs every 6 (six) hours as needed for wheezing or shortness of breath.  Dispense: 1 Inhaler; Refill: 2 - predniSONE (STERAPRED UNI-PAK 21 TAB) 10 MG (21) TBPK tablet; Use as directed  Dispense: 21 tablet; Refill: 0   Evelina Dun, FNP

## 2017-09-29 NOTE — Patient Instructions (Signed)

## 2017-10-08 NOTE — Progress Notes (Signed)
Subjective:     Indication: atrial fibrillation Bleeding signs/symptoms: None Thromboembolic signs/symptoms: None  Missed Coumadin doses: None Medication changes: no Dietary changes: no Bacterial/viral infection: no Other concerns: no  The following portions of the patient's history were reviewed and updated as appropriate: allergies and current medications.  Review of Systems Pertinent items are noted in HPI.   Objective:    INR Today: 2.5 Current dose: 2.5mg  on Mon, Wed, Fri and 5mg  all other days  Assessment:    Therapeutic INR for goal of 2-3   Plan:    1. New dose: no change   2. Next INR: At appt with Dr Laurance Flatten on 11/05/17  Chong Sicilian, RN

## 2017-10-15 ENCOUNTER — Other Ambulatory Visit: Payer: Self-pay | Admitting: Family Medicine

## 2017-11-02 ENCOUNTER — Other Ambulatory Visit: Payer: Self-pay | Admitting: Family Medicine

## 2017-11-05 ENCOUNTER — Ambulatory Visit (INDEPENDENT_AMBULATORY_CARE_PROVIDER_SITE_OTHER): Payer: Medicare Other | Admitting: Family Medicine

## 2017-11-05 ENCOUNTER — Encounter: Payer: Self-pay | Admitting: Family Medicine

## 2017-11-05 VITALS — BP 136/76 | HR 81 | Temp 97.1°F | Ht 67.0 in | Wt 195.4 lb

## 2017-11-05 DIAGNOSIS — M150 Primary generalized (osteo)arthritis: Secondary | ICD-10-CM

## 2017-11-05 DIAGNOSIS — M1712 Unilateral primary osteoarthritis, left knee: Secondary | ICD-10-CM | POA: Diagnosis not present

## 2017-11-05 DIAGNOSIS — I10 Essential (primary) hypertension: Secondary | ICD-10-CM | POA: Diagnosis not present

## 2017-11-05 DIAGNOSIS — I739 Peripheral vascular disease, unspecified: Secondary | ICD-10-CM | POA: Diagnosis not present

## 2017-11-05 DIAGNOSIS — K219 Gastro-esophageal reflux disease without esophagitis: Secondary | ICD-10-CM

## 2017-11-05 DIAGNOSIS — E7849 Other hyperlipidemia: Secondary | ICD-10-CM | POA: Diagnosis not present

## 2017-11-05 DIAGNOSIS — I482 Chronic atrial fibrillation, unspecified: Secondary | ICD-10-CM

## 2017-11-05 DIAGNOSIS — R972 Elevated prostate specific antigen [PSA]: Secondary | ICD-10-CM | POA: Diagnosis not present

## 2017-11-05 DIAGNOSIS — E559 Vitamin D deficiency, unspecified: Secondary | ICD-10-CM

## 2017-11-05 DIAGNOSIS — I700 Atherosclerosis of aorta: Secondary | ICD-10-CM | POA: Diagnosis not present

## 2017-11-05 DIAGNOSIS — M159 Polyosteoarthritis, unspecified: Secondary | ICD-10-CM | POA: Insufficient documentation

## 2017-11-05 DIAGNOSIS — M25552 Pain in left hip: Secondary | ICD-10-CM

## 2017-11-05 LAB — COAGUCHEK XS/INR WAIVED
INR: 2.7 — ABNORMAL HIGH (ref 0.9–1.1)
PROTHROMBIN TIME: 32.7 s

## 2017-11-05 NOTE — Patient Instructions (Signed)
Continue to walk and exercise as much as possible Drink plenty of water Avoid climbing Move slowly We will get an appointment with a cardiologist to evaluate your risk for hip replacement so that you can discuss this risk with the orthopedist and make a decision as to whether to move forward or not.

## 2017-11-05 NOTE — Addendum Note (Signed)
Addended by: Marin Olp on: 11/05/2017 05:16 PM   Modules accepted: Orders

## 2017-11-05 NOTE — Progress Notes (Signed)
Subjective:    Patient ID: [name], male    DOB: [birth year], 84 y.o.   MRN: [id number]  HPI Pt here today for follow up of chronic medical conditions which include hyperlipidemia, hypertension, atrial firillation and [name].  Patient is pleasant and alert and very laid back.  He says he is tired and he has a lot of hip pain which is very limiting.  He is in a downhill spiral because the pain and limitation of use of his left hip have caused him to become more physically fatigued.  We will get an appointment with a cardiologist to get a good feeling about whether a hip replacement is safe for him or not and he agrees with to be willing to check this out.  Today he denies any chest pain or shortness of breath.  He denies any trouble with his stomach including nausea vomiting diarrhea blood in the stool or black tarry bowel movements.  Right now, he is passing his water well and the voiding is a little bit slow at times especially after certain drinks.  He is in good spirits today but does not want to go through hip surgery if it is a significant risk.   Review of Systems  Constitutional: Positive for fatigue (no energy per pt).  HENT: Negative.   Eyes: Negative.   Respiratory: Negative.   Gastrointestinal: Negative.   Genitourinary: Positive for decreased urine volume.  Musculoskeletal: Positive for arthralgias (L hip persistent).  Neurological: Negative.        Objective:   Physical Exam  Constitutional: He is oriented to person, place, and time. He appears well-developed and well-nourished. No distress.  Patient is pleasant and relaxed and wants to get the right information so he can proceed forward with doing hip surgery are no hip surgery.  HENT:  Head: Normocephalic and atraumatic.  Right Ear: External ear normal.  Left Ear: External ear normal.  Mouth/Throat: Oropharynx is clear and moist. No oropharyngeal exudate.  Slight nasal congestion right side  Eyes: Conjunctivae and EOM  are normal. Pupils are equal, round, and reactive to light. Right eye exhibits no discharge. Left eye exhibits no discharge. No scleral icterus.  Neck: Normal range of motion. Neck supple. No thyromegaly present.  No bruits thyromegaly or anterior cervical adenopathy noted today.  Cardiovascular: Normal rate and normal heart sounds.  No murmur heard. The heart is irregular irregular at 84/min.  The pulse is in the left lower extremity were diminished but they were palpable in the right lower extremity.  Pulmonary/Chest: Effort normal and breath sounds normal. No respiratory distress. He has no wheezes. He has no rales.  Clear anteriorly and posteriorly  Abdominal: Soft. Bowel sounds are normal. He exhibits no mass. There is no tenderness. There is no rebound and no guarding.  Slight epigastric tenderness with no liver or spleen enlargement masses bruits.  Inguinal pulses were palpable bilaterally.  Musculoskeletal: He exhibits tenderness. He exhibits no edema.  Decreased movement left hip and pain with palpation  Lymphadenopathy:    He has no cervical adenopathy.  Neurological: He is alert and oriented to person, place, and time. He has normal reflexes. No cranial nerve deficit.  Skin: Skin is warm and dry. No rash noted.  Psychiatric: He has a normal mood and affect. His behavior is normal. Judgment and thought content normal.  Nursing note and vitals reviewed.  BP 136/76 (BP Location: Left Arm, Patient Position: Sitting, Cuff Size: Normal)   Pulse 81  Temp (!) 97.1 F (36.2 C) (Oral)   Ht 5\' 7"  (1.702 m)   Wt 195 lb 6.4 oz (88.6 kg)   BMI 30.60 kg/m         Assessment & Plan:  1. Chronic atrial fibrillation (HCC) -Continue current blood thinner and continue to follow-up with the cardiologist as planned especially with consideration for doing any left hip surgery - CoaguChek XS/INR Waived  2. Other hyperlipidemia -Continue current treatment pending results of lab work - Lipid  panel - Hepatic function panel  3. Hypertension, essential -Blood pressure is good today and he will continue with current treatment - Basic Metabolic Panel - CBC with Differential/Platelet  4. Left hip pain -Continue to follow-up with orthopedic specialist and see cardiologist for cardiac clearance for possible left hip replacement  5. Thoracic aortic atherosclerosis ([place]) -Continue aggressive therapeutic lifestyle changes and lovastatin pending results of lab work  6. Gastroesophageal reflux disease, esophagitis presence not specified -Today, no complaints with reflux symptoms.  7. Primary osteoarthritis of left knee -Continue follow-up with orthopedist  8. Vitamin D deficiency -Continue vitamin D replacement pending results of labs  9. Primary osteoarthritis involving multiple joints  10. Peripheral vascular insufficiency ([place]) -The patient says he can walk 100 yards to his shop but is very tired after doing that but has no specific complaints with leg pain other than hip pain.  Patient Instructions  Continue to walk and exercise as much as possible Drink plenty of water Avoid climbing Move slowly We will get an appointment with a cardiologist to evaluate your risk for hip replacement so that you can discuss this risk with the orthopedist and make a decision as to whether to move forward or not.  Arrie Senate MD

## 2017-11-05 NOTE — Addendum Note (Signed)
Addended by: Marin Olp on: 11/05/2017 09:56 AM   Modules accepted: Orders

## 2017-11-06 LAB — LIPID PANEL
Chol/HDL Ratio: 3.8 ratio (ref 0.0–5.0)
Cholesterol, Total: 181 mg/dL (ref 100–199)
HDL: 48 mg/dL (ref >39)
LDL CALC: 103 mg/dL — AB (ref 0–99)
Triglycerides: 150 mg/dL — ABNORMAL HIGH (ref 0–149)
VLDL CHOLESTEROL CAL: 30 mg/dL (ref 5–40)

## 2017-11-06 LAB — HEPATIC FUNCTION PANEL
ALBUMIN: 4.1 g/dL (ref 3.5–4.7)
ALT: 18 IU/L (ref 0–44)
AST: 18 IU/L (ref 0–40)
Alkaline Phosphatase: 59 IU/L (ref 39–117)
Bilirubin Total: 0.4 mg/dL (ref 0.0–1.2)
Bilirubin, Direct: 0.12 mg/dL (ref 0.00–0.40)
TOTAL PROTEIN: 6.9 g/dL (ref 6.0–8.5)

## 2017-11-06 LAB — PSA, TOTAL AND FREE
PSA, Free Pct: 15.3 %
PSA, Free: 1.67 ng/mL
Prostate Specific Ag, Serum: 10.9 ng/mL — ABNORMAL HIGH (ref 0.0–4.0)

## 2017-11-06 LAB — CBC WITH DIFFERENTIAL/PLATELET
BASOS ABS: 0 10*3/uL (ref 0.0–0.2)
Basos: 0 %
EOS (ABSOLUTE): 0.7 10*3/uL — AB (ref 0.0–0.4)
Eos: 6 %
Hematocrit: 42.8 % (ref 37.5–51.0)
Hemoglobin: 14.5 g/dL (ref 13.0–17.7)
Immature Grans (Abs): 0 10*3/uL (ref 0.0–0.1)
Immature Granulocytes: 0 %
LYMPHS ABS: 5.2 10*3/uL — AB (ref 0.7–3.1)
Lymphs: 42 %
MCH: 32.5 pg (ref 26.6–33.0)
MCHC: 33.9 g/dL (ref 31.5–35.7)
MCV: 96 fL (ref 79–97)
MONOS ABS: 0.6 10*3/uL (ref 0.1–0.9)
Monocytes: 5 %
Neutrophils Absolute: 5.9 10*3/uL (ref 1.4–7.0)
Neutrophils: 47 %
PLATELETS: 279 10*3/uL (ref 150–379)
RBC: 4.46 x10E6/uL (ref 4.14–5.80)
RDW: 13.4 % (ref 12.3–15.4)
WBC: 12.4 10*3/uL — AB (ref 3.4–10.8)

## 2017-11-06 LAB — BASIC METABOLIC PANEL
BUN / CREAT RATIO: 13 (ref 10–24)
BUN: 15 mg/dL (ref 8–27)
CO2: 23 mmol/L (ref 20–29)
CREATININE: 1.18 mg/dL (ref 0.76–1.27)
Calcium: 9.3 mg/dL (ref 8.6–10.2)
Chloride: 102 mmol/L (ref 96–106)
GFR calc non Af Amer: 57 mL/min/{1.73_m2} — ABNORMAL LOW (ref >59)
GFR, EST AFRICAN AMERICAN: 66 mL/min/{1.73_m2} (ref >59)
Glucose: 117 mg/dL — ABNORMAL HIGH (ref 65–99)
Potassium: 4.2 mmol/L (ref 3.5–5.2)
SODIUM: 142 mmol/L (ref 134–144)

## 2017-11-09 ENCOUNTER — Other Ambulatory Visit: Payer: Self-pay | Admitting: Family Medicine

## 2017-12-15 NOTE — Progress Notes (Signed)
HPI The patient presents for followup of his coronary disease and atrial fibrillation.   Lexiscan Myoview in June 2018 demonstrated infarct but no ischemia.  EF on echo was 40 - 45%.  This was done to evaluate chest pain.  He returns for follow up.  He  presents for preoperative evaluation as he is thinking of getting his left hip fixed.  Since I last saw him he has had no new cardiovascular problems.  Despite his hip pain he can walk 80 yards on level ground.  He denies any palpitations, presyncope or syncope.  He is not noticing any chest discomfort, neck or arm discomfort.  He denies any shortness of breath, PND or orthopnea.   Allergies  Allergen Reactions  . Penicillins Hives and Rash  . Hct [Hydrochlorothiazide] Other (See Comments)    hyper  . Liptruzet [Ezetimibe-Atorvastatin] Other (See Comments)    myalgia  . Statins Other (See Comments)    myalgia    Current Outpatient Medications  Medication Sig Dispense Refill  . acetaminophen (TYLENOL) 650 MG CR tablet Take 650 mg by mouth every 8 (eight) hours as needed for pain.     . carvedilol (COREG) 12.5 MG tablet TAKE 1&1/2 TABLET EVERY MORNING AND TAKE 1 TABLET EVERY EVENING 225 tablet 0  . cholecalciferol (VITAMIN D) 400 UNITS TABS tablet Take 1,000 Units by mouth.    . clindamycin (CLEOCIN) 150 MG capsule Take 1 capsule by mouth 3 (three) times daily.    Marland Kitchen diltiazem (CARDIZEM CD) 120 MG 24 hr capsule TAKE ONE (1) CAPSULE EACH DAY 90 capsule 0  . ezetimibe (ZETIA) 10 MG tablet Take 10 mg by mouth daily.    . finasteride (PROSCAR) 5 MG tablet TAKE ONE (1) TABLET EACH DAY 90 tablet 0  . furosemide (LASIX) 20 MG tablet Take 20 mg by mouth daily.    Marland Kitchen lovastatin (MEVACOR) 20 MG tablet Take 1 tablet (20 mg total) by mouth at bedtime. 90 tablet 1  . Polyethyl Glycol-Propyl Glycol (SYSTANE) 0.4-0.3 % GEL ophthalmic gel Place 1 application into both eyes as needed.     . warfarin (COUMADIN) 5 MG tablet TAKE ONE (1) TABLET EACH DAY 90  tablet 1   No current facility-administered medications for this visit.     Past Medical History:  Diagnosis Date  . Anemia   . Atrial fibrillation ([place])   . Cataract   . Cellulitis    In past  . Coronary artery disease    5 bypasses  . DJD (degenerative joint disease)   . Ejection fraction < 50%    Mildly reduced, 40% by echo  . GERD (gastroesophageal reflux disease)   . Hyperlipidemia   . Hypertension   . Persistent atrial fibrillation ([place])   . Prostate hypertrophy    on CT scan 09/2014  . PUD (peptic ulcer disease)   . Skin cancer of eyelid    Resected    Past Surgical History:  Procedure Laterality Date  . APPENDECTOMY    . BYPASS GRAFT  2005  . CATARACT EXTRACTION W/PHACO Left 07/22/2015   Procedure: CATARACT EXTRACTION PHACO AND INTRAOCULAR LENS PLACEMENT LEFT EYE CDE=8.14;  Surgeon: Tonny Branch, MD;  Location: [facility];  Service: Ophthalmology;  Laterality: Left;  . CORONARY ARTERY BYPASS GRAFT  4/05   LIMA to LAD, SVG to PDA, SVG to ramus intermediate  . EYE SURGERY  07/2015  . EYELID CARCINOMA EXCISION     Skin cancer resected  . Heart bypass  2005  . TOTAL HIP ARTHROPLASTY     Right  . TOTAL KNEE ARTHROPLASTY     Right     ROS:  As stated in the HPI and negative for all other systems.  PHYSICAL EXAM BP (!) 158/97   Pulse 76   Ht 5\' 7"  (1.702 m)   Wt 195 lb (88.5 kg)   BMI 30.54 kg/m   GENERAL:  Well appearing NECK:  No jugular venous distention, waveform within normal limits, carotid upstroke brisk and symmetric, no bruits, no thyromegaly LUNGS:  Clear to auscultation bilaterally CHEST:  Unremarkable HEART:  PMI not displaced or sustained,S1and S2 within normal limits, no S3,  no clicks, no rubs, no murmurs, irregular  ABD:  Flat, positive bowel sounds normal in frequency in pitch, no bruits, no rebound, no guarding, no midline pulsatile mass, no hepatomegaly, no splenomegaly EXT:  2 plus pulses throughout, no edema, no cyanosis no  clubbing    EKG:  NA  Lab Results  Component Value Date   CHOL 181 11/05/2017   TRIG 150 (H) 11/05/2017   HDL 48 11/05/2017   LDLCALC 103 (H) 11/05/2017    ASSESSMENT AND PLAN  [name]:  He had a Lexiscan Myoview this year without ischemia.  No change in therapy is indicated.   CM:  He has a mildly reduced EF.  He is euvolemic.  No change in therapy.   HTN:  The blood pressure is at target. No change in medications is indicated. We will continue with therapeutic lifestyle changes (TLC).    HYPERLIPIDEMIA:   This is followed closely by Dr. Laurance Flatten.    ATRIAL FIBRILLATION:   Mr. ELISE GLADDEN has a CHA2DS2 - VASc score of 4.  No change in therapy.  He can hold the warfarin as needed for any hip procedure.    CAROTID STENOSIS:  He had a Doppler this year with less than 50% left ICA stenosis.  We can repeat this in a couple of years.   PREOP:  He has moderate risk mostly related to age.  The Lee criteria calculates this as 6.6% for major cardiovascular events with non cardiac surgery.  However, this is not prohibitive.  We talked about waiting this against the potential benefit and he will consider this further.  We certainly could manage rate and blood pressure.  He had to have good volume management.  He is not having any active anginal symptoms and would not need to have any stress testing prior.

## 2017-12-16 ENCOUNTER — Ambulatory Visit: Payer: Medicare Other | Admitting: Cardiology

## 2017-12-16 ENCOUNTER — Encounter: Payer: Self-pay | Admitting: Cardiology

## 2017-12-16 VITALS — BP 158/97 | HR 76 | Ht 67.0 in | Wt 195.0 lb

## 2017-12-16 DIAGNOSIS — I482 Chronic atrial fibrillation, unspecified: Secondary | ICD-10-CM

## 2017-12-16 DIAGNOSIS — I10 Essential (primary) hypertension: Secondary | ICD-10-CM | POA: Diagnosis not present

## 2017-12-16 DIAGNOSIS — Z01810 Encounter for preprocedural cardiovascular examination: Secondary | ICD-10-CM

## 2017-12-16 DIAGNOSIS — E785 Hyperlipidemia, unspecified: Secondary | ICD-10-CM | POA: Diagnosis not present

## 2017-12-16 DIAGNOSIS — I6522 Occlusion and stenosis of left carotid artery: Secondary | ICD-10-CM | POA: Diagnosis not present

## 2017-12-16 NOTE — Patient Instructions (Signed)
Medication Instructions:  Continue current medications  If you need a refill on your cardiac medications before your next appointment, please call your pharmacy.  Labwork: None Ordered   Testing/Procedures: None Ordered  Follow-Up: Your physician wants you to follow-up in: April.    Thank you for choosing [facility]!!

## 2017-12-22 ENCOUNTER — Telehealth: Payer: Self-pay | Admitting: Cardiology

## 2017-12-22 NOTE — Telephone Encounter (Signed)
Closed Encounter  °

## 2017-12-31 ENCOUNTER — Ambulatory Visit (INDEPENDENT_AMBULATORY_CARE_PROVIDER_SITE_OTHER): Payer: Medicare Other | Admitting: Pharmacist Clinician (PhC)/ Clinical Pharmacy Specialist

## 2017-12-31 DIAGNOSIS — Z7901 Long term (current) use of anticoagulants: Secondary | ICD-10-CM

## 2017-12-31 DIAGNOSIS — I4819 Other persistent atrial fibrillation: Secondary | ICD-10-CM

## 2017-12-31 DIAGNOSIS — I481 Persistent atrial fibrillation: Secondary | ICD-10-CM

## 2017-12-31 LAB — COAGUCHEK XS/INR WAIVED
INR: 2.4 — AB (ref 0.9–1.1)
Prothrombin Time: 28.5 s

## 2017-12-31 NOTE — Patient Instructions (Signed)
Description    Return to normal schedule of 1/2 tab Mon, Wed, Fri and 1 whole tab all other days  INR today is 2.4  Goal is 2-3

## 2018-01-10 ENCOUNTER — Other Ambulatory Visit: Payer: Self-pay | Admitting: Family Medicine

## 2018-01-12 ENCOUNTER — Ambulatory Visit: Payer: Medicare Other | Admitting: Cardiology

## 2018-01-20 ENCOUNTER — Other Ambulatory Visit: Payer: Self-pay | Admitting: Family Medicine

## 2018-02-11 ENCOUNTER — Ambulatory Visit: Payer: Medicare Other | Admitting: Pharmacist Clinician (PhC)/ Clinical Pharmacy Specialist

## 2018-02-11 DIAGNOSIS — I4819 Other persistent atrial fibrillation: Secondary | ICD-10-CM

## 2018-02-11 DIAGNOSIS — Z7901 Long term (current) use of anticoagulants: Secondary | ICD-10-CM | POA: Diagnosis not present

## 2018-02-11 DIAGNOSIS — I481 Persistent atrial fibrillation: Secondary | ICD-10-CM | POA: Diagnosis not present

## 2018-02-11 LAB — COAGUCHEK XS/INR WAIVED
INR: 3.6 — AB (ref 0.9–1.1)
PROTHROMBIN TIME: 43.2 s

## 2018-02-11 NOTE — Patient Instructions (Signed)
Description   Do not taking warfarin today and then tomorrow take 1/2 tablet.  Sunday resume your regular schedule.  Eat a high vitamin K food this weekend.  INR today is 3.6 Goal is 2-3  Too thin today

## 2018-02-12 IMAGING — NM NM MYOCAR MULTI W/SPECT W/WALL MOTION & EF
2 series · 12 of 12 positions shown · non-contrast
Comparison: none

[Series 1: rest · 6.51mm/px · 6 of 64 frames shown]
[frame 6/64]
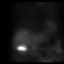
[frame 16/64]
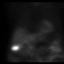
[frame 27/64]
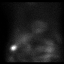
[frame 38/64]
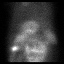
[frame 48/64]
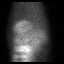
[frame 59/64]
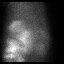

[Series 2: stress gated · 6.51mm/px · 6 of 64 frames shown]
[frame 6/64]
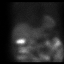
[frame 16/64]
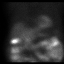
[frame 27/64]
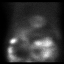
[frame 38/64]
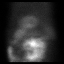
[frame 48/64]
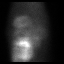
[frame 59/64]
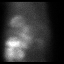

[12 of 12 positions shown; findings below may reference images not displayed]

Canned report from images found in remote index.

Refer to host system for actual result text.

## 2018-02-15 ENCOUNTER — Other Ambulatory Visit: Payer: Self-pay | Admitting: Family Medicine

## 2018-02-23 ENCOUNTER — Ambulatory Visit: Payer: Medicare Other | Admitting: Cardiology

## 2018-03-14 ENCOUNTER — Other Ambulatory Visit: Payer: Self-pay | Admitting: Family Medicine

## 2018-03-15 ENCOUNTER — Ambulatory Visit (INDEPENDENT_AMBULATORY_CARE_PROVIDER_SITE_OTHER): Payer: Medicare Other | Admitting: Family Medicine

## 2018-03-15 ENCOUNTER — Encounter: Payer: Self-pay | Admitting: Family Medicine

## 2018-03-15 ENCOUNTER — Ambulatory Visit (INDEPENDENT_AMBULATORY_CARE_PROVIDER_SITE_OTHER): Payer: Medicare Other

## 2018-03-15 VITALS — BP 158/89 | HR 88 | Temp 97.5°F | Ht 67.0 in | Wt 190.0 lb

## 2018-03-15 DIAGNOSIS — R432 Parageusia: Secondary | ICD-10-CM | POA: Diagnosis not present

## 2018-03-15 DIAGNOSIS — E7849 Other hyperlipidemia: Secondary | ICD-10-CM

## 2018-03-15 DIAGNOSIS — E559 Vitamin D deficiency, unspecified: Secondary | ICD-10-CM

## 2018-03-15 DIAGNOSIS — I481 Persistent atrial fibrillation: Secondary | ICD-10-CM | POA: Diagnosis not present

## 2018-03-15 DIAGNOSIS — K219 Gastro-esophageal reflux disease without esophagitis: Secondary | ICD-10-CM

## 2018-03-15 DIAGNOSIS — I700 Atherosclerosis of aorta: Secondary | ICD-10-CM

## 2018-03-15 DIAGNOSIS — R972 Elevated prostate specific antigen [PSA]: Secondary | ICD-10-CM | POA: Diagnosis not present

## 2018-03-15 DIAGNOSIS — Z7901 Long term (current) use of anticoagulants: Secondary | ICD-10-CM | POA: Diagnosis not present

## 2018-03-15 DIAGNOSIS — I10 Essential (primary) hypertension: Secondary | ICD-10-CM

## 2018-03-15 DIAGNOSIS — Z951 Presence of aortocoronary bypass graft: Secondary | ICD-10-CM | POA: Diagnosis not present

## 2018-03-15 DIAGNOSIS — I739 Peripheral vascular disease, unspecified: Secondary | ICD-10-CM | POA: Diagnosis not present

## 2018-03-15 DIAGNOSIS — I4819 Other persistent atrial fibrillation: Secondary | ICD-10-CM

## 2018-03-15 LAB — COAGUCHEK XS/INR WAIVED
INR: 2.1 — AB (ref 0.9–1.1)
Prothrombin Time: 25.2 s

## 2018-03-15 NOTE — Addendum Note (Signed)
Addended by: Zannie Cove on: 03/15/2018 04:22 PM   Modules accepted: Orders

## 2018-03-15 NOTE — Addendum Note (Signed)
Addended by: Zannie Cove on: 03/15/2018 10:46 AM   Modules accepted: Orders

## 2018-03-15 NOTE — Patient Instructions (Addendum)
Medicare Annual Wellness Visit  [facility] and the medical providers at [facility] strive to bring you the best medical care.  In doing so we not only want to address your current medical conditions and concerns but also to detect new conditions early and prevent illness, disease and health-related problems.    Medicare offers a yearly Wellness Visit which allows our clinical staff to assess your need for preventative services including immunizations, lifestyle education, counseling to decrease risk of preventable diseases and screening for fall risk and other medical concerns.    This visit is provided free of charge (no copay) for all Medicare recipients. The clinical pharmacists at [facility] have begun to conduct these Wellness Visits which will also include a thorough review of all your medications.    As you primary medical provider recommend that you make an appointment for your Annual Wellness Visit if you have not done so already this year.  You may set up this appointment before you leave today or you may call back ([phone]) and schedule an appointment.  Please make sure when you call that you mention that you are scheduling your Annual Wellness Visit with the clinical pharmacist so that the appointment may be made for the proper length of time.     Continue current medications. Continue good therapeutic lifestyle changes which include good diet and exercise. Fall precautions discussed with patient. If an FOBT was given today- please return it to our front desk. If you are over 32 years old - you may need Prevnar 42 or the adult Pneumonia vaccine.  **Flu shots are available--- please call and schedule a FLU-CLINIC appointment**  After your visit with [place] today you will receive a survey in the mail or online from [employer] regarding your care with [place]. Please take a moment to fill this out. Your feedback is very  important to [place] as you can help [place] better understand your patient needs as well as improve your experience and satisfaction. WE CARE ABOUT YOU!!!    Description   Continue same dose of Coumadin  INR today is 2.1 Goal is 2-3

## 2018-03-15 NOTE — Progress Notes (Signed)
Subjective:    Patient ID: [name], male    DOB: 1932-05-31, 84 y.o.   MRN: [id number]  HPI Pt here for follow up and management of chronic medical problems which includes hypertension and a fib. He is taking medication regularly.  He is followed regularly by the cardiologist.  He has osteoarthritis of multiple joints including the left knee.  Patient has a history of an elevated PSA and he sees the urologist periodically.  His wife helps him manage his visits to the different specialist that he has to see.  He was last seen in March of this year by the cardiologist.  We will most likely be due to repeat his carotid ultrasound in the next month or so.  He also had an echocardiogram in June 2018 and everything was stable with the carotid Dopplers his echo and Myoview.  His ejection fraction is about 50%.  His last chest x-ray was done in January 2016 and he did have minimal bibasilar subsegmental atelectasis at that time.  He has had total knee arthroplasty and hip arthroplasty. He is taking a statin drug he is on Coumadin because of the atrial fibrillation takes vitamin D 3 and diltiazem 120 mg along with carvedilol.  Recent today has had a recent visit with Dr. Percival Spanish and was cleared to have left hip replacement by [facility] orthopedics in September.  He denies any chest pain or shortness of breath.  He denies any trouble with his stomach including nausea vomiting diarrhea blood in the stool or black tarry bowel movements.  He does complain of a disgusting taste of sodium in his mouth constantly.  He was given some suggestions of things that could cause this by the dentist and this will be scanned into the record and we will look into these things further for the patient.  He is passing his water well but it is slow.  He does have ongoing history of elevated PSA and would like to have that checked again today and if it is still going up would like to see the urologist about this.  He will get a  chest x-ray today.    Patient Active Problem List   Diagnosis Date Noted  . Preop cardiovascular exam 12/16/2017  . Dyslipidemia 12/16/2017  . Stenosis of left carotid artery 12/16/2017  . Peripheral vascular insufficiency ([place]) 11/05/2017  . Primary osteoarthritis involving multiple joints 11/05/2017  . Vitamin D deficiency 11/05/2017  . Primary osteoarthritis of left knee 11/05/2017  . Chronic atrial fibrillation ([place]) 11/05/2017  . Left-sided carotid artery disease ([place]) 06/02/2017  . Coronary artery disease involving native coronary artery of native heart without angina pectoris 06/02/2017  . Chest pain with moderate risk of acute coronary syndrome 03/29/2017  . Right hip pain 02/12/2016  . Metabolic syndrome 26/37/8588  . Acute blood loss anemia 01/22/2015  . Urinary retention 01/21/2015  . Cataract of both eyes 05/25/2014  . Long term current use of anticoagulant therapy 02/14/2013  . OVERWEIGHT 12/11/2009  . ANEMIA 02/25/2009  . Essential hypertension 02/25/2009  . Coronary atherosclerosis 02/25/2009  . Atrial fibrillation, persistent ([place]) 02/25/2009  . GERD 02/25/2009  . Osteoarthritis 02/25/2009   Outpatient Encounter Medications as of 03/15/2018  Medication Sig  . acetaminophen (TYLENOL) 650 MG CR tablet Take 650 mg by mouth every 8 (eight) hours as needed for pain.   . carvedilol (COREG) 12.5 MG tablet TAKE 1&1/2 TABLET EVERY MORNING & TAKE 1 TABLET EVERY EVENING  . cholecalciferol (VITAMIN D)  400 UNITS TABS tablet Take 1,000 Units by mouth.  . diltiazem (CARDIZEM CD) 120 MG 24 hr capsule TAKE ONE (1) CAPSULE EACH DAY  . ezetimibe (ZETIA) 10 MG tablet TAKE ONE (1) TABLET EACH DAY  . finasteride (PROSCAR) 5 MG tablet TAKE ONE (1) TABLET EACH DAY  . furosemide (LASIX) 20 MG tablet Take 20 mg by mouth daily.  Marland Kitchen lovastatin (MEVACOR) 20 MG tablet Take 1 tablet (20 mg total) by mouth at bedtime.  Vladimir Faster Glycol-Propyl Glycol (SYSTANE) 0.4-0.3 % GEL ophthalmic gel  Place 1 application into both eyes as needed.   . warfarin (COUMADIN) 5 MG tablet TAKE ONE (1) TABLET EACH DAY  . [DISCONTINUED] clindamycin (CLEOCIN) 150 MG capsule Take 1 capsule by mouth 3 (three) times daily.  . [DISCONTINUED] ezetimibe (ZETIA) 10 MG tablet Take 10 mg by mouth daily.   No facility-administered encounter medications on file as of 03/15/2018.      Review of Systems  Constitutional: Negative.   HENT: Negative.        Bad taste in mouth  Eyes: Negative.   Respiratory: Negative.   Cardiovascular: Negative.   Gastrointestinal: Negative.   Endocrine: Negative.   Genitourinary: Negative.   Musculoskeletal: Positive for arthralgias (left hip pain - cleared by cardio for surgery).  Skin: Negative.   Allergic/Immunologic: Negative.   Neurological: Negative.   Hematological: Negative.   Psychiatric/Behavioral: Negative.        Objective:   Physical Exam  Constitutional: He is oriented to person, place, and time. He appears well-developed and well-nourished. No distress.  The patient is pleasant and alert and has already been okay for left hip replacement in September by the cardiologist.  HENT:  Head: Normocephalic and atraumatic.  Right Ear: External ear normal.  Left Ear: External ear normal.  Nose: Nose normal.  Mouth/Throat: Oropharynx is clear and moist. No oropharyngeal exudate.  Patient has dentures both upper and lower and despite this he still having problems with his taste.  Eyes: Pupils are equal, round, and reactive to light. Conjunctivae and EOM are normal. Right eye exhibits no discharge. Left eye exhibits no discharge.  Neck: Normal range of motion. Neck supple. No thyromegaly present.  No bruits thyromegaly or anterior cervical adenopathy   Cardiovascular: Normal rate, normal heart sounds and intact distal pulses.  No murmur heard. The heart is irregular irregular at 84/min  Pulmonary/Chest: Effort normal and breath sounds normal. He has no wheezes.  He has no rales. He exhibits no tenderness.  Clear anteriorly and posteriorly no axillary adenopathy or chest wall tenderness or masses.  Abdominal: Soft. Bowel sounds are normal. He exhibits no mass. There is no tenderness.  No liver or spleen enlargement no masses no organ enlargement and no inguinal adenopathy  Genitourinary: Rectum normal and penis normal.  Genitourinary Comments: The prostate is very large and firm.  There were no rectal masses.  External genitalia were within normal limits with normal testicles and no inguinal hernias were palpated.  Musculoskeletal: He exhibits tenderness. He exhibits no edema.  Patient has stiffness and pain in the left knee and left hip.  Lymphadenopathy:    He has no cervical adenopathy.  Neurological: He is alert and oriented to person, place, and time. He has normal reflexes. No cranial nerve deficit.  Skin: Skin is warm and dry. No rash noted.  Psychiatric: He has a normal mood and affect. His behavior is normal. Judgment and thought content normal.  Alert with normal mood and affect  Nursing note and vitals reviewed.  BP (!) 158/89 (BP Location: Left Arm)   Pulse 88   Temp (!) 97.5 F (36.4 C) (Oral)   Ht 5' 7"  (1.702 m)   Wt 190 lb (86.2 kg)   BMI 29.76 kg/m         Assessment & Plan:  1. Atrial fibrillation, persistent ([place]) -A recent visit to the cardiologist okayed patient for hip replacement in September.  He will continue to follow-up with the cardiologist. - CBC with Differential/Platelet  2. Hypertension, essential -The blood pressure was elevated at 924 systolic in the office today.  He will continue with current treatment - BMP8+EGFR - CBC with Differential/Platelet - Hepatic function panel - DG Chest 2 View; Future  3. Other hyperlipidemia -Continue with aggressive therapeutic lifestyle changes and current statin treatment pending results of lab work - CBC with Differential/Platelet - Lipid panel - DG Chest 2  View; Future  4. Thoracic aortic atherosclerosis ([place]) -Continue with therapeutic lifestyle changes and current statin treatment - CBC with Differential/Platelet - Lipid panel - DG Chest 2 View; Future  5. Gastroesophageal reflux disease, esophagitis presence not specified -No complaints today with heartburn or indigestion - CBC with Differential/Platelet - Hepatic function panel  6. Vitamin D deficiency -Continue with vitamin D replacement pending results of lab work - CBC with Differential/Platelet - VITAMIN D 25 Hydroxy (Vit-D Deficiency, Fractures)  7. Peripheral vascular insufficiency ([facility]) -The pulses were diminished but present in both lower extremities.  8. Elevated PSA - PSA, total and free  9. Long term current use of anticoagulant therapy -The INR was 2.1 today and he will continue with his Coumadin at the current regimen and recheck the INR in 6 weeks.  10. Altered taste -Refer to neurology for further evaluation of this.  Patient Instructions                        Medicare Annual Wellness Visit  [place] and the medical providers at [facility] strive to bring you the best medical care.  In doing so we not only want to address your current medical conditions and concerns but also to detect new conditions early and prevent illness, disease and health-related problems.    Medicare offers a yearly Wellness Visit which allows our clinical staff to assess your need for preventative services including immunizations, lifestyle education, counseling to decrease risk of preventable diseases and screening for fall risk and other medical concerns.    This visit is provided free of charge (no copay) for all Medicare recipients. The clinical pharmacists at [facility] have begun to conduct these Wellness Visits which will also include a thorough review of all your medications.    As you primary medical provider recommend that  you make an appointment for your Annual Wellness Visit if you have not done so already this year.  You may set up this appointment before you leave today or you may call back ([phone]) and schedule an appointment.  Please make sure when you call that you mention that you are scheduling your Annual Wellness Visit with the clinical pharmacist so that the appointment may be made for the proper length of time.     Continue current medications. Continue good therapeutic lifestyle changes which include good diet and exercise. Fall precautions discussed with patient. If an FOBT was given today- please return it to our front desk. If you are over 49 years old -  you may need Prevnar 13 or the adult Pneumonia vaccine.  **Flu shots are available--- please call and schedule a FLU-CLINIC appointment**  After your visit with [place] today you will receive a survey in the mail or online from [employer] regarding your care with [place]. Please take a moment to fill this out. Your feedback is very important to [place] as you can help [place] better understand your patient needs as well as improve your experience and satisfaction. WE CARE ABOUT YOU!!!    Description   Continue same dose of Coumadin  INR today is 2.1 Goal is 2-3      Continue Coumadin as currently doing and repeat INR in 6 weeks Follow-up with orthopedic surgeon as planned If PSA remains elevated we will make appointment with urology for follow-up there also. Digital patient for a visit with neurology to further evaluate taste abnormality.  Arrie Senate MD

## 2018-03-16 LAB — BMP8+EGFR
BUN/Creatinine Ratio: 13 (ref 10–24)
BUN: 13 mg/dL (ref 8–27)
CHLORIDE: 106 mmol/L (ref 96–106)
CO2: 25 mmol/L (ref 20–29)
Calcium: 9.4 mg/dL (ref 8.6–10.2)
Creatinine, Ser: 1.04 mg/dL (ref 0.76–1.27)
GFR calc Af Amer: 76 mL/min/{1.73_m2} (ref >59)
GFR, EST NON AFRICAN AMERICAN: 66 mL/min/{1.73_m2} (ref >59)
GLUCOSE: 102 mg/dL — AB (ref 65–99)
POTASSIUM: 4.5 mmol/L (ref 3.5–5.2)
SODIUM: 144 mmol/L (ref 134–144)

## 2018-03-16 LAB — HEPATIC FUNCTION PANEL
ALT: 13 IU/L (ref 0–44)
AST: 15 IU/L (ref 0–40)
Albumin: 4.2 g/dL (ref 3.5–4.7)
Alkaline Phosphatase: 61 IU/L (ref 39–117)
BILIRUBIN TOTAL: 0.5 mg/dL (ref 0.0–1.2)
BILIRUBIN, DIRECT: 0.14 mg/dL (ref 0.00–0.40)
Total Protein: 6.7 g/dL (ref 6.0–8.5)

## 2018-03-16 LAB — LIPID PANEL
CHOLESTEROL TOTAL: 184 mg/dL (ref 100–199)
Chol/HDL Ratio: 3.8 ratio (ref 0.0–5.0)
HDL: 49 mg/dL (ref >39)
LDL Calculated: 110 mg/dL — ABNORMAL HIGH (ref 0–99)
TRIGLYCERIDES: 127 mg/dL (ref 0–149)
VLDL CHOLESTEROL CAL: 25 mg/dL (ref 5–40)

## 2018-03-16 LAB — CBC WITH DIFFERENTIAL/PLATELET
BASOS ABS: 0 10*3/uL (ref 0.0–0.2)
BASOS: 0 %
EOS (ABSOLUTE): 0.8 10*3/uL — AB (ref 0.0–0.4)
Eos: 7 %
Hematocrit: 42.1 % (ref 37.5–51.0)
Hemoglobin: 14.4 g/dL (ref 13.0–17.7)
IMMATURE GRANS (ABS): 0 10*3/uL (ref 0.0–0.1)
IMMATURE GRANULOCYTES: 0 %
LYMPHS: 44 %
Lymphocytes Absolute: 5.2 10*3/uL — ABNORMAL HIGH (ref 0.7–3.1)
MCH: 32.9 pg (ref 26.6–33.0)
MCHC: 34.2 g/dL (ref 31.5–35.7)
MCV: 96 fL (ref 79–97)
MONOS ABS: 0.7 10*3/uL (ref 0.1–0.9)
Monocytes: 6 %
NEUTROS PCT: 43 %
Neutrophils Absolute: 5.1 10*3/uL (ref 1.4–7.0)
PLATELETS: 249 10*3/uL (ref 150–450)
RBC: 4.38 x10E6/uL (ref 4.14–5.80)
RDW: 13.2 % (ref 12.3–15.4)
WBC: 11.8 10*3/uL — AB (ref 3.4–10.8)

## 2018-03-16 LAB — PSA, TOTAL AND FREE
PSA FREE PCT: 14.5 %
PSA, Free: 1.39 ng/mL
Prostate Specific Ag, Serum: 9.6 ng/mL — ABNORMAL HIGH (ref 0.0–4.0)

## 2018-03-16 LAB — VITAMIN D 25 HYDROXY (VIT D DEFICIENCY, FRACTURES): Vit D, 25-Hydroxy: 36.2 ng/mL (ref 30.0–100.0)

## 2018-03-28 ENCOUNTER — Telehealth: Payer: Self-pay | Admitting: *Deleted

## 2018-03-28 NOTE — Telephone Encounter (Signed)
Patient with diagnosis of Afib on warfarin for anticoagulation.    Procedure: THA Date of procedure: TBD  CHADS2-VASc score of  4 (CHF, HTN, AGE, DM2, stroke/tia x 2, [name], AGE, male)  Per office protocol, patient can hold warfarin for 5 days prior to procedure WITHOUT lovenox bridge.  Dr. Rosezella Florida most recent OV states "He can hold the warfarin as needed for any hip procedure."  Our practice dose not manage his INRs and we should notify managing practice of need to hold.

## 2018-03-28 NOTE — Telephone Encounter (Signed)
Will send to pharm.  Ws seen 12/2017 will need to be called.

## 2018-03-28 NOTE — Telephone Encounter (Signed)
PRIMARY CARDIOLOGIST: DR [facility] Pre-operative Risk Assessment    Request for surgical clearance:  1. What type of surgery is being performed? LEFT HIP:THA W/WO AUTOGRAFT/ALLOGRAFT  2. When is this surgery scheduled? TBD   3. What type of clearance is required (medical clearance vs. Pharmacy clearance to hold med vs. Both)? MEDICAL AND PHARMACY  4. Are there any medications that need to be held prior to surgery and how long?WARFARIN  5. Practice name and name of physician performing surgery? [facility] ;  DR RONALD GIOFFRE  6. What is your office phone number [phone]   7.   What is your office fax number [phone] ATTN: KELLY HANCOCK  8.   Anesthesia type (None, local, MAC, general) ? GENERAL   [name] 03/28/2018, 4:06 PM  _________________________________________________________________   (provider comments below)

## 2018-03-29 NOTE — Telephone Encounter (Signed)
   Primary Cardiologist: Minus Breeding, MD  Chart reviewed as part of pre-operative protocol coverage. Patient was contacted 03/29/2018 in reference to pre-operative risk assessment for pending surgery as outlined below.  [name] was last seen on 12/16/2017 by Dr. Lolly Mustache at which time Dr. Percival Spanish noted the [name] has moderate risk mostly related to age.  The Lee criteria calculates this as 6.6% for major cardiovascular events with non cardiac surgery.  However, this is not prohibitive. [name] feels that the benefit of surgery outweighs the riska nd would like to proceed. Since that day, [name] has done well without any new CV complaints. He will need good volume management with surgery.  Therefore, based on ACC/AHA guidelines, the patient would be at acceptable risk for the planned procedure without further cardiovascular testing.   According to our pharmacy protocol:  CHADS2-VASc score of  4 (CHF, HTN, AGE, DM2, stroke/tia x 2, [name], AGE, male)  Per office protocol, patient can hold warfarin for 5 days prior to procedure WITHOUT lovenox bridge.  Dr. Rosezella Florida most recent OV states "He can hold the warfarin as needed for any hip procedure."  Our practice does not manage his INRs and we should notify managing practice of need to hold.   I will route this recommendation to the requesting party via Epic fax function and remove from pre-op pool.  Please call with questions.  Daune Perch, NP 03/29/2018, 4:16 PM

## 2018-03-30 ENCOUNTER — Telehealth: Payer: Self-pay | Admitting: *Deleted

## 2018-03-30 NOTE — Telephone Encounter (Signed)
Called pt's primary care provider in regards to our recommendations on how to hold Warfarin. She stated that their pharmacy person want be back into the office until Friday . She stated that she would leave a message and have them call us back on Friday.

## 2018-03-30 NOTE — Telephone Encounter (Signed)
Sharyn Lull,   Please address and have triage nurse call them on Friday - as I will be out of town.    thanks,  Trayven Lumadue B.

## 2018-04-01 ENCOUNTER — Telehealth: Payer: Self-pay | Admitting: Cardiology

## 2018-04-01 NOTE — Telephone Encounter (Signed)
Patient is fine to stop warfarin for 5 days with no lovenox bridge.  His Chads2-vasc score is a 3 and he in not a MVR or AVR patient and no history of stroke/TIA or recent VTE event.  He is taking warfarin for atrial fibrillation and main risk factors for stroke are age and hypertension.  Gave information to triage nurse since I called three times and could not get anyone in pre-op to answer the phone.

## 2018-04-01 NOTE — Telephone Encounter (Signed)
Patient with diagnosis of atrial fibrillation on warfarin for anticoagulation.    Procedure: left hip Date of procedure: TBD  CHADS2-VASc score of  4 ( HTN, AGE,  [name], AGE,)  CrCl 65.6 Platelet count 249  Per office protocol, patient can hold warfarin for 5 days prior to  .   Patient will not need bridging with Lovenox (enoxaparin) around procedure.

## 2018-04-01 NOTE — Telephone Encounter (Signed)
New Message   Mandy with [facility] is calling to get the instructions on the Warfarin. Please call.            [facility] Pre-operative Risk Assessment    Request for surgical clearance:  1. What type of surgery is being performed? LEFT HIP:THA W/WO AUTOGRAFT/ALLOGRAFT  2. When is this surgery scheduled? TBD   3. What type of clearance is required (medical clearance vs. Pharmacy clearance to hold med vs. Both)? MEDICAL AND PHARMACY  4. Are there any medications that need to be held prior to surgery and how long?WARFARIN  5. Practice name and name of physician performing surgery? [facility] ;  DR RONALD GIOFFRE  6. What is your office phone number [phone]   7.   What is your office fax number [phone] ATTN: KELLY HANCOCK  8.   Anesthesia type (None, local, MAC, general) ? GENERAL   Raiford Simmonds 03/28/2018, 4:06 PM

## 2018-04-04 NOTE — Telephone Encounter (Signed)
[name] with [facility] [phone] , calling back with answer regrding pt's Warfarin prior ro procedure

## 2018-04-04 NOTE — Telephone Encounter (Signed)
LM for [name] at [facility] to call me back - 6/24-jhb

## 2018-04-05 NOTE — Telephone Encounter (Signed)
Called Mandy back at [facility] re: pt's Warfarin Instructions. She has clarified that she had them already and thanked me for calling and checking.

## 2018-04-05 NOTE — Telephone Encounter (Signed)
   Primary Cardiologist:James Hochrein, MD  Chart reviewed as part of pre-operative protocol coverage today - pre-op clearance already addressed by colleagues in phone note 6/17 by Greg Ades, NP as below.  "Chart reviewed as part of pre-operative protocol coverage. Patient was contacted 03/29/2018 in reference to pre-operative risk assessment for pending surgery as outlined below.  [name] was last seen on 12/16/2017 by Dr. Lolly [name] at which time Dr. Percival [name] noted the [name] has moderate risk mostly related to age. The Lee criteria calculates this as 6.6% for major cardiovascular events with non cardiac surgery. However, this is not prohibitive. [name] feels that the benefit of surgery outweighs the riska nd would like to proceed. Since that day, [name] has done well without any new CV complaints. He will need good volume management with surgery.  Therefore, based on ACC/AHA guidelines, the patient would be at acceptable risk for the planned procedure without further cardiovascular testing.   According to our pharmacy protocol:  CHADS2-VASc score of4(CHF, HTN, AGE, DM2, stroke/tia x 2, [name], AGE, male)  Per office protocol, patient can holdwarfarinfor 5days prior to procedure WITHOUT lovenox bridge.Dr. Rosezella [name] most recent OV states "He can hold the warfarin as needed for any hip procedure."  Our practice does not manage his INRs and we should notify managing practice of need to hold.  I will route this recommendation to the requesting party via Epic fax function and remove from pre-op pool.  Please call with questions.  Greg Perch, NP 03/29/2018, 4:16 PM"   Does not appear this message needs any more APP input, just needing callback to communicate the Coumadin instructions to [name] as that's who called.  Greg Pitter, PA-C 04/05/2018, 2:29 PM

## 2018-04-22 ENCOUNTER — Ambulatory Visit (INDEPENDENT_AMBULATORY_CARE_PROVIDER_SITE_OTHER): Payer: Medicare Other | Admitting: Pharmacist Clinician (PhC)/ Clinical Pharmacy Specialist

## 2018-04-22 DIAGNOSIS — Z7901 Long term (current) use of anticoagulants: Secondary | ICD-10-CM

## 2018-04-22 DIAGNOSIS — I4819 Other persistent atrial fibrillation: Secondary | ICD-10-CM

## 2018-04-22 DIAGNOSIS — I481 Persistent atrial fibrillation: Secondary | ICD-10-CM

## 2018-04-22 LAB — COAGUCHEK XS/INR WAIVED
INR: 2.3 — AB (ref 0.9–1.1)
PROTHROMBIN TIME: 27.2 s

## 2018-04-22 NOTE — Progress Notes (Signed)
Cardiac clearance on cahrt / epic 03-28-18  Clearance Dr [name] on chart   LOV cardio Dr Percival Spanish, 12-16-17 epic   INR 04-22-18 epic   CXR 03-15-18 epic   ECHO, Stress Test 04-05-17 epic

## 2018-04-22 NOTE — Patient Instructions (Signed)
Description   Continue same dose of Coumadin  INR today is 2.3 Goal is 2-3

## 2018-04-22 NOTE — Patient Instructions (Signed)
[name]  04/22/2018   Your procedure is scheduled on: 05-04-18   Report to [facility] Main  Entrance    Report to admitting at 5:30AM    Call this number if you have problems the morning of surgery [phone]     Remember: Do not eat food or drink liquids :After Midnight.     Take these medicines the morning of surgery with A SIP OF WATER: tylenol if needed, carvedilol, ezetimibe, finasteride, nasal spray if needed, systane eye drops                                 You may not have any metal on your body including hair pins and              piercings  Do not wear jewelry, make-up, lotions, powders or perfumes, deodorant              Men may shave face and neck.   Do not bring valuables to the hospital. [place].  Contacts, dentures or bridgework may not be worn into surgery.  Leave suitcase in the car. After surgery it may be brought to your room.                  Please read over the following fact sheets you were given: _____________________________________________________________________             [facility] - Preparing for Surgery Before surgery, you can play an important role.  Because skin is not sterile, your skin needs to be as free of germs as possible.  You can reduce the number of germs on your skin by washing with CHG (chlorahexidine gluconate) soap before surgery.  CHG is an antiseptic cleaner which kills germs and bonds with the skin to continue killing germs even after washing. Please DO NOT use if you have an allergy to CHG or antibacterial soaps.  If your skin becomes reddened/irritated stop using the CHG and inform your nurse when you arrive at Short Stay. Do not shave (including legs and underarms) for at least 48 hours prior to the first CHG shower.  You may shave your face/neck. Please follow these instructions carefully:  1.  Shower with CHG Soap the night before surgery  and the  morning of Surgery.  2.  If you choose to wash your hair, wash your hair first as usual with your  normal  shampoo.  3.  After you shampoo, rinse your hair and body thoroughly to remove the  shampoo.                           4.  Use CHG as you would any other liquid soap.  You can apply chg directly  to the skin and wash                       Gently with a scrungie or clean washcloth.  5.  Apply the CHG Soap to your body ONLY FROM THE NECK DOWN.   Do not use on face/ open  Wound or open sores. Avoid contact with eyes, ears mouth and genitals (private parts).                       Wash face,  Genitals (private parts) with your normal soap.             6.  Wash thoroughly, paying special attention to the area where your surgery  will be performed.  7.  Thoroughly rinse your body with warm water from the neck down.  8.  DO NOT shower/wash with your normal soap after using and rinsing off  the CHG Soap.                9.  Pat yourself dry with a clean towel.            10.  Wear clean pajamas.            11.  Place clean sheets on your bed the night of your first shower and do not  sleep with pets. Day of Surgery : Do not apply any lotions/deodorants the morning of surgery.  Please wear clean clothes to the hospital/surgery center.  FAILURE TO FOLLOW THESE INSTRUCTIONS MAY RESULT IN THE CANCELLATION OF YOUR SURGERY PATIENT SIGNATURE_________________________________  NURSE SIGNATURE__________________________________  ________________________________________________________________________   Adam Phenix  An incentive spirometer is a tool that can help keep your lungs clear and active. This tool measures how well you are filling your lungs with each breath. Taking long deep breaths may help reverse or decrease the chance of developing breathing (pulmonary) problems (especially infection) following:  A long period of time when you are unable to move or  be active. BEFORE THE PROCEDURE   If the spirometer includes an indicator to show your best effort, your nurse or respiratory therapist will set it to a desired goal.  If possible, sit up straight or lean slightly forward. Try not to slouch.  Hold the incentive spirometer in an upright position. INSTRUCTIONS FOR USE  1. Sit on the edge of your bed if possible, or sit up as far as you can in bed or on a chair. 2. Hold the incentive spirometer in an upright position. 3. Breathe out normally. 4. Place the mouthpiece in your mouth and seal your lips tightly around it. 5. Breathe in slowly and as deeply as possible, raising the piston or the ball toward the top of the column. 6. Hold your breath for 3-5 seconds or for as long as possible. Allow the piston or ball to fall to the bottom of the column. 7. Remove the mouthpiece from your mouth and breathe out normally. 8. Rest for a few seconds and repeat Steps 1 through 7 at least 10 times every 1-2 hours when you are awake. Take your time and take a few normal breaths between deep breaths. 9. The spirometer may include an indicator to show your best effort. Use the indicator as a goal to work toward during each repetition. 10. After each set of 10 deep breaths, practice coughing to be sure your lungs are clear. If you have an incision (the cut made at the time of surgery), support your incision when coughing by placing a pillow or rolled up towels firmly against it. Once you are able to get out of bed, walk around indoors and cough well. You may stop using the incentive spirometer when instructed by your caregiver.  RISKS AND COMPLICATIONS  Take your time so you do not get  dizzy or light-headed.  If you are in pain, you may need to take or ask for pain medication before doing incentive spirometry. It is harder to take a deep breath if you are having pain. AFTER USE  Rest and breathe slowly and easily.  It can be helpful to keep track of a log  of your progress. Your caregiver can provide you with a simple table to help with this. If you are using the spirometer at home, follow these instructions: [place] IF:   You are having difficultly using the spirometer.  You have trouble using the spirometer as often as instructed.  Your pain medication is not giving enough relief while using the spirometer.  You develop fever of 100.5 F (38.1 C) or higher. SEEK IMMEDIATE MEDICAL CARE IF:   You cough up bloody sputum that had not been present before.  You develop fever of 102 F (38.9 C) or greater.  You develop worsening pain at or near the incision site. MAKE SURE YOU:   Understand these instructions.  Will watch your condition.  Will get help right away if you are not doing well or get worse. Document Released: 02/08/2007 Document Revised: 12/21/2011 Document Reviewed: 04/11/2007 ExitCare Patient Information 2014 ExitCare, [place].   ________________________________________________________________________  WHAT IS A BLOOD TRANSFUSION? Blood Transfusion Information  A transfusion is the replacement of blood or some of its parts. Blood is made up of multiple cells which provide different functions.  Red blood cells carry oxygen and are used for blood loss replacement.  White blood cells fight against infection.  Platelets control bleeding.  Plasma helps clot blood.  Other blood products are available for specialized needs, such as hemophilia or other clotting disorders. BEFORE THE TRANSFUSION  Who gives blood for transfusions?   Healthy volunteers who are fully evaluated to make sure their blood is safe. This is blood bank blood. Transfusion therapy is the safest it has ever been in the practice of medicine. Before blood is taken from a donor, a complete history is taken to make sure that person has no history of diseases nor engages in risky social behavior (examples are intravenous drug use or sexual  activity with multiple partners). The donor's travel history is screened to minimize risk of transmitting infections, such as malaria. The donated blood is tested for signs of infectious diseases, such as HIV and hepatitis. The blood is then tested to be sure it is compatible with you in order to minimize the chance of a transfusion reaction. If you or a relative donates blood, this is often done in anticipation of surgery and is not appropriate for emergency situations. It takes many days to process the donated blood. RISKS AND COMPLICATIONS Although transfusion therapy is very safe and saves many lives, the main dangers of transfusion include:   Getting an infectious disease.  Developing a transfusion reaction. This is an allergic reaction to something in the blood you were given. Every precaution is taken to prevent this. The decision to have a blood transfusion has been considered carefully by your caregiver before blood is given. Blood is not given unless the benefits outweigh the risks. AFTER THE TRANSFUSION  Right after receiving a blood transfusion, you will usually feel much better and more energetic. This is especially true if your red blood cells have gotten low (anemic). The transfusion raises the level of the red blood cells which carry oxygen, and this usually causes an energy increase.  The nurse administering the transfusion will  monitor you carefully for complications. HOME CARE INSTRUCTIONS  No special instructions are needed after a transfusion. You may find your energy is better. Speak with your caregiver about any limitations on activity for underlying diseases you may have. SEEK MEDICAL CARE IF:   Your condition is not improving after your transfusion.  You develop redness or irritation at the intravenous (IV) site. SEEK IMMEDIATE MEDICAL CARE IF:  Any of the following symptoms occur over the next 12 hours:  Shaking chills.  You have a temperature by mouth above 102 F  (38.9 C), not controlled by medicine.  Chest, back, or muscle pain.  People around you feel you are not acting correctly or are confused.  Shortness of breath or difficulty breathing.  Dizziness and fainting.  You get a rash or develop hives.  You have a decrease in urine output.  Your urine turns a dark color or changes to pink, red, or brown. Any of the following symptoms occur over the next 10 days:  You have a temperature by mouth above 102 F (38.9 C), not controlled by medicine.  Shortness of breath.  Weakness after normal activity.  The white part of the eye turns yellow (jaundice).  You have a decrease in the amount of urine or are urinating less often.  Your urine turns a dark color or changes to pink, red, or brown. Document Released: 09/25/2000 Document Revised: 12/21/2011 Document Reviewed: 05/14/2008 [facility] Patient Information 2014 [place], [place].  _______________________________________________________________________

## 2018-04-25 ENCOUNTER — Encounter (HOSPITAL_COMMUNITY): Payer: Self-pay

## 2018-04-25 ENCOUNTER — Encounter (HOSPITAL_COMMUNITY)
Admission: RE | Admit: 2018-04-25 | Discharge: 2018-04-25 | Disposition: A | Discharge status: Home / Self Care | Payer: Medicare Other | Source: Ambulatory Visit | Attending: Orthopedic Surgery | Admitting: Orthopedic Surgery

## 2018-04-25 ENCOUNTER — Other Ambulatory Visit: Payer: Self-pay

## 2018-04-25 DIAGNOSIS — I4891 Unspecified atrial fibrillation: Secondary | ICD-10-CM | POA: Diagnosis not present

## 2018-04-25 DIAGNOSIS — Z01812 Encounter for preprocedural laboratory examination: Secondary | ICD-10-CM | POA: Insufficient documentation

## 2018-04-25 DIAGNOSIS — I10 Essential (primary) hypertension: Secondary | ICD-10-CM | POA: Insufficient documentation

## 2018-04-25 DIAGNOSIS — Z01810 Encounter for preprocedural cardiovascular examination: Secondary | ICD-10-CM | POA: Insufficient documentation

## 2018-04-25 HISTORY — DX: Other complications of anesthesia, initial encounter: T88.59XA

## 2018-04-25 HISTORY — DX: Adverse effect of unspecified anesthetic, initial encounter: T41.45XA

## 2018-04-25 LAB — CBC WITH DIFFERENTIAL/PLATELET
Basophils Absolute: 0 10*3/uL (ref 0.0–0.1)
Basophils Relative: 0 %
Eosinophils Absolute: 0.6 10*3/uL (ref 0.0–0.7)
Eosinophils Relative: 5 %
HCT: 43.5 % (ref 39.0–52.0)
Hemoglobin: 14.2 g/dL (ref 13.0–17.0)
Lymphocytes Relative: 41 %
Lymphs Abs: 5.4 10*3/uL — ABNORMAL HIGH (ref 0.7–4.0)
MCH: 32.9 pg (ref 26.0–34.0)
MCHC: 32.6 g/dL (ref 30.0–36.0)
MCV: 100.7 fL — ABNORMAL HIGH (ref 78.0–100.0)
Monocytes Absolute: 0.6 10*3/uL (ref 0.1–1.0)
Monocytes Relative: 5 %
Neutro Abs: 6.5 10*3/uL (ref 1.7–7.7)
Neutrophils Relative %: 49 %
Platelets: 236 10*3/uL (ref 150–400)
RBC: 4.32 MIL/uL (ref 4.22–5.81)
RDW: 13.1 % (ref 11.5–15.5)
WBC: 13.2 10*3/uL — ABNORMAL HIGH (ref 4.0–10.5)

## 2018-04-25 LAB — COMPREHENSIVE METABOLIC PANEL
ALT: 17 U/L (ref 0–44)
AST: 20 U/L (ref 15–41)
Albumin: 4.2 g/dL (ref 3.5–5.0)
Alkaline Phosphatase: 50 U/L (ref 38–126)
Anion gap: 10 (ref 5–15)
BUN: 13 mg/dL (ref 8–23)
CO2: 27 mmol/L (ref 22–32)
Calcium: 9.3 mg/dL (ref 8.9–10.3)
Chloride: 105 mmol/L (ref 98–111)
Creatinine, Ser: 1.01 mg/dL (ref 0.61–1.24)
GFR calc Af Amer: >60 mL/min (ref >60)
GFR calc non Af Amer: >60 mL/min (ref >60)
Glucose, Bld: 114 mg/dL — ABNORMAL HIGH (ref 70–99)
Potassium: 4.7 mmol/L (ref 3.5–5.1)
Sodium: 142 mmol/L (ref 135–145)
Total Bilirubin: 0.9 mg/dL (ref 0.3–1.2)
Total Protein: 7.4 g/dL (ref 6.5–8.1)

## 2018-04-25 LAB — APTT: aPTT: 32 seconds (ref 24–36)

## 2018-04-25 LAB — PROTIME-INR
INR: 1.85
Prothrombin Time: 21.2 seconds — ABNORMAL HIGH (ref 11.4–15.2)

## 2018-04-25 LAB — SURGICAL PCR SCREEN
MRSA, PCR: NEGATIVE
Staphylococcus aureus: NEGATIVE

## 2018-04-29 ENCOUNTER — Other Ambulatory Visit: Payer: Self-pay | Admitting: Family Medicine

## 2018-05-03 MED ORDER — BUPIVACAINE LIPOSOME 1.3 % IJ SUSP
20.0000 mL | Freq: Once | INTRAMUSCULAR | Status: DC
  Filled 2018-05-03: qty 20

## 2018-05-03 NOTE — Anesthesia Preprocedure Evaluation (Addendum)
Anesthesia Evaluation  Patient identified by MRN, date of birth, ID band Patient awake    Reviewed: Allergy & Precautions, NPO status , Patient's Chart, lab work & pertinent test results  History of Anesthesia Complications Negative for: history of anesthetic complications  Airway Mallampati: I  TM Distance: >3 FB Neck ROM: Full    Dental  (+) Dental Advisory Given   Pulmonary former smoker (quit 1965),    breath sounds clear to auscultation       Cardiovascular hypertension, Pt. on medications and Pt. on home beta blockers (-) angina+ [name] and + CABG  + dysrhythmias  Rhythm:Regular Rate:Normal  '18 ECHO: EF 40-45%, mild MR '18 Stress: This is an intermediate risk study. Risk based on decreased ejection fraction 45-54%   Neuro/Psych negative neurological ROS     GI/Hepatic Neg liver ROS, GERD  Controlled,  Endo/Other  negative endocrine ROS  Renal/GU negative Renal ROS     Musculoskeletal  (+) Arthritis , Osteoarthritis,    Abdominal   Peds  Hematology  (+) Blood dyscrasia (coumadin: INR 1.08), ,   Anesthesia Other Findings   Reproductive/Obstetrics                           Anesthesia Physical Anesthesia Plan  ASA: III  Anesthesia Plan: General   Post-op Pain Management:    Induction: Intravenous  PONV Risk Score and Plan: 2 and Ondansetron, Dexamethasone and Treatment may vary due to age or medical condition  Airway Management Planned: Oral ETT  Additional Equipment:   Intra-op Plan:   Post-operative Plan: Extubation in OR  Informed Consent: I have reviewed the patients History and Physical, chart, labs and discussed the procedure including the risks, benefits and alternatives for the proposed anesthesia with the patient or authorized representative who has indicated his/her understanding and acceptance.   Dental advisory given  Plan Discussed with: CRNA and  Surgeon  Anesthesia Plan Comments: (Plan routine monitors, GETA Pt declines SAB)        Anesthesia Quick Evaluation

## 2018-05-04 ENCOUNTER — Encounter (HOSPITAL_COMMUNITY): Payer: Self-pay

## 2018-05-04 ENCOUNTER — Inpatient Hospital Stay (HOSPITAL_COMMUNITY)
Admission: RE | Admit: 2018-05-04 | Discharge: 2018-05-13 | DRG: 469 | Disposition: A | Discharge status: Home Health | Payer: Medicare Other | Attending: Orthopedic Surgery | Admitting: Orthopedic Surgery

## 2018-05-04 ENCOUNTER — Inpatient Hospital Stay (HOSPITAL_COMMUNITY): Payer: Medicare Other

## 2018-05-04 ENCOUNTER — Inpatient Hospital Stay (HOSPITAL_COMMUNITY): Payer: Medicare Other | Admitting: Anesthesiology

## 2018-05-04 ENCOUNTER — Other Ambulatory Visit: Payer: Self-pay

## 2018-05-04 ENCOUNTER — Encounter (HOSPITAL_COMMUNITY)
Admission: RE | Disposition: A | Discharge status: Home Health | Payer: Self-pay | Source: Home / Self Care | Attending: Orthopedic Surgery

## 2018-05-04 DIAGNOSIS — D62 Acute posthemorrhagic anemia: Secondary | ICD-10-CM

## 2018-05-04 DIAGNOSIS — E785 Hyperlipidemia, unspecified: Secondary | ICD-10-CM | POA: Diagnosis not present

## 2018-05-04 DIAGNOSIS — Z471 Aftercare following joint replacement surgery: Secondary | ICD-10-CM | POA: Diagnosis not present

## 2018-05-04 DIAGNOSIS — D649 Anemia, unspecified: Secondary | ICD-10-CM | POA: Diagnosis not present

## 2018-05-04 DIAGNOSIS — Z85828 Personal history of other malignant neoplasm of skin: Secondary | ICD-10-CM | POA: Diagnosis not present

## 2018-05-04 DIAGNOSIS — Z7901 Long term (current) use of anticoagulants: Secondary | ICD-10-CM

## 2018-05-04 DIAGNOSIS — I951 Orthostatic hypotension: Secondary | ICD-10-CM | POA: Diagnosis not present

## 2018-05-04 DIAGNOSIS — M1612 Unilateral primary osteoarthritis, left hip: Principal | ICD-10-CM | POA: Diagnosis present

## 2018-05-04 DIAGNOSIS — E8881 Metabolic syndrome: Secondary | ICD-10-CM | POA: Diagnosis present

## 2018-05-04 DIAGNOSIS — K219 Gastro-esophageal reflux disease without esophagitis: Secondary | ICD-10-CM | POA: Diagnosis present

## 2018-05-04 DIAGNOSIS — E559 Vitamin D deficiency, unspecified: Secondary | ICD-10-CM | POA: Diagnosis not present

## 2018-05-04 DIAGNOSIS — Z961 Presence of intraocular lens: Secondary | ICD-10-CM | POA: Diagnosis present

## 2018-05-04 DIAGNOSIS — Z880 Allergy status to penicillin: Secondary | ICD-10-CM

## 2018-05-04 DIAGNOSIS — Z8711 Personal history of peptic ulcer disease: Secondary | ICD-10-CM

## 2018-05-04 DIAGNOSIS — I110 Hypertensive heart disease with heart failure: Secondary | ICD-10-CM | POA: Diagnosis not present

## 2018-05-04 DIAGNOSIS — I5043 Acute on chronic combined systolic (congestive) and diastolic (congestive) heart failure: Secondary | ICD-10-CM | POA: Diagnosis not present

## 2018-05-04 DIAGNOSIS — I2510 Atherosclerotic heart disease of native coronary artery without angina pectoris: Secondary | ICD-10-CM | POA: Diagnosis not present

## 2018-05-04 DIAGNOSIS — Z85118 Personal history of other malignant neoplasm of bronchus and lung: Secondary | ICD-10-CM | POA: Diagnosis not present

## 2018-05-04 DIAGNOSIS — Z888 Allergy status to other drugs, medicaments and biological substances status: Secondary | ICD-10-CM | POA: Diagnosis not present

## 2018-05-04 DIAGNOSIS — I4891 Unspecified atrial fibrillation: Secondary | ICD-10-CM | POA: Diagnosis present

## 2018-05-04 DIAGNOSIS — Z951 Presence of aortocoronary bypass graft: Secondary | ICD-10-CM | POA: Diagnosis not present

## 2018-05-04 DIAGNOSIS — I481 Persistent atrial fibrillation: Secondary | ICD-10-CM | POA: Diagnosis not present

## 2018-05-04 DIAGNOSIS — I9589 Other hypotension: Secondary | ICD-10-CM | POA: Diagnosis not present

## 2018-05-04 DIAGNOSIS — R05 Cough: Secondary | ICD-10-CM | POA: Diagnosis not present

## 2018-05-04 DIAGNOSIS — I959 Hypotension, unspecified: Secondary | ICD-10-CM | POA: Diagnosis not present

## 2018-05-04 DIAGNOSIS — Z823 Family history of stroke: Secondary | ICD-10-CM

## 2018-05-04 DIAGNOSIS — I739 Peripheral vascular disease, unspecified: Secondary | ICD-10-CM | POA: Diagnosis present

## 2018-05-04 DIAGNOSIS — Z7951 Long term (current) use of inhaled steroids: Secondary | ICD-10-CM | POA: Diagnosis not present

## 2018-05-04 DIAGNOSIS — E861 Hypovolemia: Secondary | ICD-10-CM | POA: Diagnosis not present

## 2018-05-04 DIAGNOSIS — E860 Dehydration: Secondary | ICD-10-CM | POA: Diagnosis not present

## 2018-05-04 DIAGNOSIS — M24552 Contracture, left hip: Secondary | ICD-10-CM | POA: Diagnosis not present

## 2018-05-04 DIAGNOSIS — Z8249 Family history of ischemic heart disease and other diseases of the circulatory system: Secondary | ICD-10-CM | POA: Diagnosis not present

## 2018-05-04 DIAGNOSIS — Z9842 Cataract extraction status, left eye: Secondary | ICD-10-CM

## 2018-05-04 DIAGNOSIS — Z96642 Presence of left artificial hip joint: Secondary | ICD-10-CM | POA: Diagnosis not present

## 2018-05-04 DIAGNOSIS — I9788 Other intraoperative complications of the circulatory system, not elsewhere classified: Secondary | ICD-10-CM | POA: Diagnosis not present

## 2018-05-04 DIAGNOSIS — M25552 Pain in left hip: Secondary | ICD-10-CM | POA: Diagnosis present

## 2018-05-04 DIAGNOSIS — Z96641 Presence of right artificial hip joint: Secondary | ICD-10-CM | POA: Diagnosis not present

## 2018-05-04 DIAGNOSIS — K5900 Constipation, unspecified: Secondary | ICD-10-CM | POA: Diagnosis not present

## 2018-05-04 DIAGNOSIS — D72829 Elevated white blood cell count, unspecified: Secondary | ICD-10-CM

## 2018-05-04 DIAGNOSIS — Z87891 Personal history of nicotine dependence: Secondary | ICD-10-CM

## 2018-05-04 DIAGNOSIS — I509 Heart failure, unspecified: Secondary | ICD-10-CM

## 2018-05-04 DIAGNOSIS — R059 Cough, unspecified: Secondary | ICD-10-CM

## 2018-05-04 HISTORY — PX: TOTAL HIP ARTHROPLASTY: SHX124

## 2018-05-04 LAB — TYPE AND SCREEN
ABO/RH(D): B POS
Antibody Screen: NEGATIVE

## 2018-05-04 LAB — PROTIME-INR
INR: 1.08
Prothrombin Time: 13.9 seconds (ref 11.4–15.2)

## 2018-05-04 LAB — APTT: aPTT: 26 seconds (ref 24–36)

## 2018-05-04 SURGERY — ARTHROPLASTY, HIP, TOTAL,POSTERIOR APPROACH
Anesthesia: General | Site: Hip | Laterality: Left

## 2018-05-04 MED ORDER — FLEET ENEMA 7-19 GM/118ML RE ENEM: 1.0000 | ENEMA | Freq: Once | RECTAL | Status: DC | PRN

## 2018-05-04 MED ORDER — POLYETHYL GLYCOL-PROPYL GLYCOL 0.4-0.3 % OP GEL: Freq: Every day | OPHTHALMIC | Status: DC

## 2018-05-04 MED ORDER — WARFARIN - PHARMACIST DOSING INPATIENT: Freq: Every day | Status: DC

## 2018-05-04 MED ORDER — ACETAMINOPHEN 325 MG PO TABS
325.0000 mg | ORAL_TABLET | Freq: Four times a day (QID) | ORAL | Status: DC | PRN
  Administered 2018-05-06 – 2018-05-12 (×2): 650 mg via ORAL
  Filled 2018-05-04 (×2): qty 2

## 2018-05-04 MED ORDER — ALUM & MAG HYDROXIDE-SIMETH 200-200-20 MG/5ML PO SUSP: 30.0000 mL | ORAL | Status: DC | PRN

## 2018-05-04 MED ORDER — ONDANSETRON HCL 4 MG/2ML IJ SOLN
4.0000 mg | Freq: Four times a day (QID) | INTRAMUSCULAR | Status: DC | PRN
Start: 2018-05-04 — End: 2018-05-13

## 2018-05-04 MED ORDER — ONDANSETRON HCL 4 MG/2ML IJ SOLN
INTRAMUSCULAR | Status: AC
  Filled 2018-05-04: qty 2

## 2018-05-04 MED ORDER — METOCLOPRAMIDE HCL 5 MG PO TABS: 5.0000 mg | ORAL_TABLET | Freq: Three times a day (TID) | ORAL | Status: DC | PRN

## 2018-05-04 MED ORDER — METHOCARBAMOL 500 MG PO TABS
500.0000 mg | ORAL_TABLET | Freq: Three times a day (TID) | ORAL | Status: DC | PRN
  Administered 2018-05-06 (×2): 500 mg via ORAL
  Filled 2018-05-04 (×3): qty 1

## 2018-05-04 MED ORDER — FENTANYL CITRATE (PF) 250 MCG/5ML IJ SOLN
INTRAMUSCULAR | Status: DC | PRN
  Administered 2018-05-04: 50 ug via INTRAVENOUS
  Administered 2018-05-04: 100 ug via INTRAVENOUS
  Administered 2018-05-04 (×2): 50 ug via INTRAVENOUS

## 2018-05-04 MED ORDER — FINASTERIDE 5 MG PO TABS
5.0000 mg | ORAL_TABLET | Freq: Every day | ORAL | Status: DC
  Administered 2018-05-05 – 2018-05-13 (×9): 5 mg via ORAL
  Filled 2018-05-04 (×9): qty 1

## 2018-05-04 MED ORDER — DOCUSATE SODIUM 100 MG PO CAPS
100.0000 mg | ORAL_CAPSULE | Freq: Two times a day (BID) | ORAL | Status: DC
  Administered 2018-05-04 – 2018-05-13 (×18): 100 mg via ORAL
  Filled 2018-05-04 (×18): qty 1

## 2018-05-04 MED ORDER — ACETAMINOPHEN ER 650 MG PO TBCR: 650.0000 mg | EXTENDED_RELEASE_TABLET | Freq: Three times a day (TID) | ORAL | Status: DC | PRN

## 2018-05-04 MED ORDER — SODIUM CHLORIDE 0.9 % IJ SOLN
INTRAMUSCULAR | Status: DC | PRN
  Administered 2018-05-04: 20 mL

## 2018-05-04 MED ORDER — PHENYLEPHRINE 40 MCG/ML (10ML) SYRINGE FOR IV PUSH (FOR BLOOD PRESSURE SUPPORT)
PREFILLED_SYRINGE | INTRAVENOUS | Status: AC
  Filled 2018-05-04: qty 10

## 2018-05-04 MED ORDER — METOCLOPRAMIDE HCL 5 MG/ML IJ SOLN
INTRAMUSCULAR | Status: AC
  Filled 2018-05-04: qty 2

## 2018-05-04 MED ORDER — CARVEDILOL 12.5 MG PO TABS
12.5000 mg | ORAL_TABLET | Freq: Two times a day (BID) | ORAL | Status: DC
  Administered 2018-05-04 – 2018-05-06 (×4): 12.5 mg via ORAL
  Filled 2018-05-04 (×5): qty 1

## 2018-05-04 MED ORDER — ROCURONIUM BROMIDE 10 MG/ML (PF) SYRINGE
PREFILLED_SYRINGE | INTRAVENOUS | Status: DC | PRN
  Administered 2018-05-04: 50 mg via INTRAVENOUS

## 2018-05-04 MED ORDER — THROMBIN (RECOMBINANT) 5000 UNITS EX SOLR
CUTANEOUS | Status: AC
  Filled 2018-05-04: qty 10000

## 2018-05-04 MED ORDER — FLUTICASONE PROPIONATE 50 MCG/ACT NA SUSP
1.0000 | Freq: Every day | NASAL | Status: DC | PRN
  Filled 2018-05-04: qty 16

## 2018-05-04 MED ORDER — STERILE WATER FOR IRRIGATION IR SOLN
Status: DC | PRN
  Administered 2018-05-04: 2000 mL

## 2018-05-04 MED ORDER — MENTHOL 3 MG MT LOZG: 1.0000 | LOZENGE | OROMUCOSAL | Status: DC | PRN

## 2018-05-04 MED ORDER — FERROUS SULFATE 325 (65 FE) MG PO TABS
325.0000 mg | ORAL_TABLET | Freq: Three times a day (TID) | ORAL | Status: DC
  Administered 2018-05-04 – 2018-05-13 (×22): 325 mg via ORAL
  Filled 2018-05-04 (×23): qty 1

## 2018-05-04 MED ORDER — VANCOMYCIN HCL IN DEXTROSE 1-5 GM/200ML-% IV SOLN
1000.0000 mg | Freq: Two times a day (BID) | INTRAVENOUS | Status: AC
  Administered 2018-05-04: 1000 mg via INTRAVENOUS
  Filled 2018-05-04: qty 200

## 2018-05-04 MED ORDER — TRANEXAMIC ACID 1000 MG/10ML IV SOLN
1000.0000 mg | INTRAVENOUS | Status: AC
  Administered 2018-05-04: 1000 mg via INTRAVENOUS
  Filled 2018-05-04: qty 10

## 2018-05-04 MED ORDER — DEXAMETHASONE SODIUM PHOSPHATE 10 MG/ML IJ SOLN
INTRAMUSCULAR | Status: AC
  Filled 2018-05-04: qty 1

## 2018-05-04 MED ORDER — CHLORHEXIDINE GLUCONATE 4 % EX LIQD: 60.0000 mL | Freq: Once | CUTANEOUS | Status: DC

## 2018-05-04 MED ORDER — POLYVINYL ALCOHOL 1.4 % OP SOLN
1.0000 [drp] | Freq: Every day | OPHTHALMIC | Status: DC
  Administered 2018-05-04 – 2018-05-05 (×2): 1 [drp] via OPHTHALMIC
  Filled 2018-05-04: qty 15

## 2018-05-04 MED ORDER — PROPOFOL 10 MG/ML IV BOLUS
INTRAVENOUS | Status: DC | PRN
  Administered 2018-05-04: 70 mg via INTRAVENOUS

## 2018-05-04 MED ORDER — FENTANYL CITRATE (PF) 100 MCG/2ML IJ SOLN
25.0000 ug | INTRAMUSCULAR | Status: DC | PRN
  Administered 2018-05-04 (×2): 50 ug via INTRAVENOUS

## 2018-05-04 MED ORDER — DILTIAZEM HCL ER COATED BEADS 120 MG PO CP24
120.0000 mg | ORAL_CAPSULE | Freq: Every day | ORAL | Status: DC
  Administered 2018-05-05 – 2018-05-12 (×8): 120 mg via ORAL
  Filled 2018-05-04 (×9): qty 1

## 2018-05-04 MED ORDER — PROPOFOL 10 MG/ML IV BOLUS
INTRAVENOUS | Status: AC
  Filled 2018-05-04: qty 20

## 2018-05-04 MED ORDER — ROCURONIUM BROMIDE 10 MG/ML (PF) SYRINGE
PREFILLED_SYRINGE | INTRAVENOUS | Status: AC
  Filled 2018-05-04: qty 10

## 2018-05-04 MED ORDER — LIDOCAINE 2% (20 MG/ML) 5 ML SYRINGE
INTRAMUSCULAR | Status: DC | PRN
  Administered 2018-05-04: 100 mg via INTRAVENOUS

## 2018-05-04 MED ORDER — FUROSEMIDE 20 MG PO TABS
20.0000 mg | ORAL_TABLET | ORAL | Status: DC
  Administered 2018-05-05: 20 mg via ORAL
  Filled 2018-05-04: qty 1

## 2018-05-04 MED ORDER — LACTATED RINGERS IV SOLN
INTRAVENOUS | Status: DC
  Administered 2018-05-04 (×2): via INTRAVENOUS
  Administered 2018-05-04: 1000 mL via INTRAVENOUS

## 2018-05-04 MED ORDER — WARFARIN SODIUM 5 MG PO TABS
7.5000 mg | ORAL_TABLET | Freq: Once | ORAL | Status: AC
  Administered 2018-05-05: 7.5 mg via ORAL
  Filled 2018-05-04: qty 1

## 2018-05-04 MED ORDER — BUPIVACAINE LIPOSOME 1.3 % IJ SUSP
INTRAMUSCULAR | Status: DC | PRN
  Administered 2018-05-04: 20 mL

## 2018-05-04 MED ORDER — LIDOCAINE 2% (20 MG/ML) 5 ML SYRINGE
INTRAMUSCULAR | Status: AC
  Filled 2018-05-04: qty 5

## 2018-05-04 MED ORDER — EZETIMIBE 10 MG PO TABS
10.0000 mg | ORAL_TABLET | Freq: Every day | ORAL | Status: DC
  Administered 2018-05-05 – 2018-05-13 (×9): 10 mg via ORAL
  Filled 2018-05-04 (×9): qty 1

## 2018-05-04 MED ORDER — SUGAMMADEX SODIUM 200 MG/2ML IV SOLN
INTRAVENOUS | Status: DC | PRN
  Administered 2018-05-04: 200 mg via INTRAVENOUS

## 2018-05-04 MED ORDER — ACETAMINOPHEN 500 MG PO TABS
1000.0000 mg | ORAL_TABLET | Freq: Four times a day (QID) | ORAL | Status: AC
  Administered 2018-05-04 – 2018-05-05 (×4): 1000 mg via ORAL
  Filled 2018-05-04 (×4): qty 2

## 2018-05-04 MED ORDER — ENOXAPARIN SODIUM 100 MG/ML ~~LOC~~ SOLN
85.0000 mg | Freq: Two times a day (BID) | SUBCUTANEOUS | Status: DC
Start: 2018-05-05 — End: 2018-05-07
  Administered 2018-05-05 – 2018-05-06 (×4): 85 mg via SUBCUTANEOUS
  Filled 2018-05-04 (×5): qty 0.85

## 2018-05-04 MED ORDER — PHENYLEPHRINE 40 MCG/ML (10ML) SYRINGE FOR IV PUSH (FOR BLOOD PRESSURE SUPPORT)
PREFILLED_SYRINGE | INTRAVENOUS | Status: DC | PRN
  Administered 2018-05-04 (×2): 80 ug via INTRAVENOUS

## 2018-05-04 MED ORDER — SODIUM CHLORIDE 0.9 % IV SOLN
INTRAVENOUS | Status: AC
  Filled 2018-05-04: qty 500000

## 2018-05-04 MED ORDER — FENTANYL CITRATE (PF) 100 MCG/2ML IJ SOLN
INTRAMUSCULAR | Status: AC
  Filled 2018-05-04: qty 2

## 2018-05-04 MED ORDER — DEXAMETHASONE SODIUM PHOSPHATE 10 MG/ML IJ SOLN
INTRAMUSCULAR | Status: DC | PRN
  Administered 2018-05-04: 10 mg via INTRAVENOUS

## 2018-05-04 MED ORDER — SUGAMMADEX SODIUM 200 MG/2ML IV SOLN
INTRAVENOUS | Status: AC
  Filled 2018-05-04: qty 2

## 2018-05-04 MED ORDER — OXYCODONE HCL 5 MG PO TABS
10.0000 mg | ORAL_TABLET | ORAL | Status: DC | PRN
  Administered 2018-05-07: 10 mg via ORAL
  Filled 2018-05-04: qty 2
  Filled 2018-05-04: qty 3

## 2018-05-04 MED ORDER — FENTANYL CITRATE (PF) 250 MCG/5ML IJ SOLN
INTRAMUSCULAR | Status: AC
  Filled 2018-05-04: qty 5

## 2018-05-04 MED ORDER — HYDROMORPHONE HCL 1 MG/ML IJ SOLN
0.5000 mg | INTRAMUSCULAR | Status: DC | PRN
  Administered 2018-05-06: 0.5 mg via INTRAVENOUS
  Administered 2018-05-07 – 2018-05-12 (×4): 1 mg via INTRAVENOUS
  Filled 2018-05-04 (×5): qty 1

## 2018-05-04 MED ORDER — SODIUM CHLORIDE 0.9 % IJ SOLN
INTRAMUSCULAR | Status: AC
  Filled 2018-05-04: qty 50

## 2018-05-04 MED ORDER — PHENOL 1.4 % MT LIQD: 1.0000 | OROMUCOSAL | Status: DC | PRN

## 2018-05-04 MED ORDER — POLYETHYLENE GLYCOL 3350 17 G PO PACK
17.0000 g | PACK | Freq: Every day | ORAL | Status: DC | PRN
  Administered 2018-05-06 – 2018-05-09 (×2): 17 g via ORAL
  Filled 2018-05-04 (×2): qty 1

## 2018-05-04 MED ORDER — METOCLOPRAMIDE HCL 5 MG/ML IJ SOLN
5.0000 mg | Freq: Three times a day (TID) | INTRAMUSCULAR | Status: DC | PRN
  Administered 2018-05-04: 10 mg via INTRAVENOUS

## 2018-05-04 MED ORDER — ONDANSETRON HCL 4 MG/2ML IJ SOLN
INTRAMUSCULAR | Status: DC | PRN
  Administered 2018-05-04: 4 mg via INTRAVENOUS

## 2018-05-04 MED ORDER — SODIUM CHLORIDE 0.9 % IV SOLN
INTRAVENOUS | Status: DC | PRN
  Administered 2018-05-04: 500 mL

## 2018-05-04 MED ORDER — OXYCODONE HCL 5 MG PO TABS
5.0000 mg | ORAL_TABLET | ORAL | Status: DC | PRN
  Administered 2018-05-04 – 2018-05-06 (×7): 5 mg via ORAL
  Administered 2018-05-06: 10 mg via ORAL
  Administered 2018-05-06 – 2018-05-07 (×3): 5 mg via ORAL
  Administered 2018-05-07: 10 mg via ORAL
  Administered 2018-05-07: 5 mg via ORAL
  Administered 2018-05-08 – 2018-05-09 (×2): 10 mg via ORAL
  Administered 2018-05-10 (×2): 5 mg via ORAL
  Administered 2018-05-11: 10 mg via ORAL
  Administered 2018-05-13 (×2): 5 mg via ORAL
  Filled 2018-05-04: qty 2
  Filled 2018-05-04 (×6): qty 1
  Filled 2018-05-04: qty 2
  Filled 2018-05-04: qty 1
  Filled 2018-05-04: qty 2
  Filled 2018-05-04 (×3): qty 1
  Filled 2018-05-04: qty 2
  Filled 2018-05-04 (×5): qty 1
  Filled 2018-05-04: qty 2

## 2018-05-04 MED ORDER — ONDANSETRON HCL 4 MG PO TABS: 4.0000 mg | ORAL_TABLET | Freq: Four times a day (QID) | ORAL | Status: DC | PRN

## 2018-05-04 MED ORDER — VANCOMYCIN HCL IN DEXTROSE 1-5 GM/200ML-% IV SOLN
1000.0000 mg | INTRAVENOUS | Status: AC
  Administered 2018-05-04: 1000 mg via INTRAVENOUS
  Filled 2018-05-04: qty 200

## 2018-05-04 MED ORDER — SODIUM CHLORIDE 0.9 % IV SOLN
INTRAVENOUS | Status: DC
  Administered 2018-05-04 – 2018-05-08 (×5): via INTRAVENOUS

## 2018-05-04 MED ORDER — BISACODYL 5 MG PO TBEC
5.0000 mg | DELAYED_RELEASE_TABLET | Freq: Every day | ORAL | Status: DC | PRN
  Administered 2018-05-06 – 2018-05-09 (×2): 5 mg via ORAL
  Filled 2018-05-04 (×2): qty 1

## 2018-05-04 MED ORDER — POLYETHYL GLYCOL-PROPYL GLYCOL 0.4-0.3 % OP GEL: 1.0000 "application " | Freq: Every day | OPHTHALMIC | Status: DC

## 2018-05-04 SURGICAL SUPPLY — 70 items
ADH SKN CLS APL DERMABOND .7 (GAUZE/BANDAGES/DRESSINGS)
AGENT HMST SPONGE THK3/8 (HEMOSTASIS) ×1
BAG SPEC THK2 15X12 ZIP CLS (MISCELLANEOUS)
BAG ZIPLOCK 12X15 (MISCELLANEOUS) IMPLANT
BLADE SAW SAG 73X25 THK (BLADE) ×1
BLADE SAW SGTL 73X25 THK (BLADE) ×2 IMPLANT
CAPT HIP TOTAL 2 ×2 IMPLANT
CLOSURE WOUND 1/2 X4 (GAUZE/BANDAGES/DRESSINGS) ×1
COVER SURGICAL LIGHT HANDLE (MISCELLANEOUS) ×3 IMPLANT
DERMABOND ADVANCED (GAUZE/BANDAGES/DRESSINGS)
DERMABOND ADVANCED .7 DNX12 (GAUZE/BANDAGES/DRESSINGS) IMPLANT
DRAPE ORTHO SPLIT 77X108 STRL (DRAPES) ×6
DRAPE POUCH INSTRU U-SHP 10X18 (DRAPES) ×3 IMPLANT
DRAPE SHEET LG 3/4 BI-LAMINATE (DRAPES) ×2 IMPLANT
DRAPE SURG 17X11 SM STRL (DRAPES) ×3 IMPLANT
DRAPE SURG ORHT 6 SPLT 77X108 (DRAPES) ×2 IMPLANT
DRAPE U-SHAPE 47X51 STRL (DRAPES) ×3 IMPLANT
DRSG ADAPTIC 3X8 NADH LF (GAUZE/BANDAGES/DRESSINGS) IMPLANT
DRSG AQUACEL AG ADV 3.5X10 (GAUZE/BANDAGES/DRESSINGS) IMPLANT
DRSG AQUACEL AG ADV 3.5X14 (GAUZE/BANDAGES/DRESSINGS) ×2 IMPLANT
DRSG TEGADERM 4X4.75 (GAUZE/BANDAGES/DRESSINGS) IMPLANT
DURAPREP 26ML APPLICATOR (WOUND CARE) ×3 IMPLANT
ELECT BLADE TIP CTD 4 INCH (ELECTRODE) ×3 IMPLANT
ELECT REM PT RETURN 15FT ADLT (MISCELLANEOUS) ×3 IMPLANT
EVACUATOR 1/8 PVC DRAIN (DRAIN) IMPLANT
FACESHIELD WRAPAROUND (MASK) ×15 IMPLANT
FACESHIELD WRAPAROUND OR TEAM (MASK) ×4 IMPLANT
GAUZE SPONGE 2X2 8PLY STRL LF (GAUZE/BANDAGES/DRESSINGS) IMPLANT
GAUZE SPONGE 4X4 12PLY STRL (GAUZE/BANDAGES/DRESSINGS) IMPLANT
GLOVE BIOGEL PI IND STRL 6.5 (GLOVE) ×1 IMPLANT
GLOVE BIOGEL PI IND STRL 7.0 (GLOVE) IMPLANT
GLOVE BIOGEL PI IND STRL 7.5 (GLOVE) IMPLANT
GLOVE BIOGEL PI IND STRL 8.5 (GLOVE) ×1 IMPLANT
GLOVE BIOGEL PI INDICATOR 6.5 (GLOVE) ×2
GLOVE BIOGEL PI INDICATOR 7.0 (GLOVE) ×6
GLOVE BIOGEL PI INDICATOR 7.5 (GLOVE) ×4
GLOVE BIOGEL PI INDICATOR 8.5 (GLOVE) ×2
GLOVE ECLIPSE 8.0 STRL XLNG CF (GLOVE) ×6 IMPLANT
GLOVE SURG SS PI 6.5 STRL IVOR (GLOVE) ×2 IMPLANT
GOWN SPEC L4 XLG W/TWL (GOWN DISPOSABLE) ×4 IMPLANT
GOWN STRL REUS W/TWL LRG LVL3 (GOWN DISPOSABLE) ×3 IMPLANT
GOWN STRL REUS W/TWL XL LVL3 (GOWN DISPOSABLE) ×3 IMPLANT
HEMOSTAT SPONGE AVITENE ULTRA (HEMOSTASIS) ×3 IMPLANT
IMMOBILIZER KNEE 20 (SOFTGOODS) ×3
IMMOBILIZER KNEE 20 THIGH 36 (SOFTGOODS) ×1 IMPLANT
KIT BASIN OR (CUSTOM PROCEDURE TRAY) ×3 IMPLANT
MANIFOLD NEPTUNE II (INSTRUMENTS) ×3 IMPLANT
MARKER SKIN DUAL TIP RULER LAB (MISCELLANEOUS) ×3 IMPLANT
NDL SAFETY ECLIPSE 18X1.5 (NEEDLE) ×1 IMPLANT
NEEDLE HYPO 18GX1.5 SHARP (NEEDLE) ×3
PACK TOTAL JOINT (CUSTOM PROCEDURE TRAY) ×3 IMPLANT
POSITIONER SURGICAL ARM (MISCELLANEOUS) ×3 IMPLANT
SPONGE GAUZE 2X2 STER 10/PKG (GAUZE/BANDAGES/DRESSINGS)
SPONGE LAP 18X18 RF (DISPOSABLE) ×2 IMPLANT
SPONGE LAP 4X18 RFD (DISPOSABLE) ×3 IMPLANT
STAPLER VISISTAT 35W (STAPLE) IMPLANT
STRIP CLOSURE SKIN 1/2X4 (GAUZE/BANDAGES/DRESSINGS) ×1 IMPLANT
SUCTION FRAZIER HANDLE 10FR (MISCELLANEOUS) ×2
SUCTION TUBE FRAZIER 10FR DISP (MISCELLANEOUS) ×1 IMPLANT
SUT MNCRL AB 4-0 PS2 18 (SUTURE) ×2 IMPLANT
SUT VIC AB 1 CT1 27 (SUTURE) ×3
SUT VIC AB 1 CT1 27XBRD ANTBC (SUTURE) ×1 IMPLANT
SUT VIC AB 2-0 CT1 27 (SUTURE) ×9
SUT VIC AB 2-0 CT1 TAPERPNT 27 (SUTURE) ×3 IMPLANT
SUT VLOC 180 0 24IN GS25 (SUTURE) ×3 IMPLANT
SYR 20CC LL (SYRINGE) ×3 IMPLANT
TOWEL OR 17X26 10 PK STRL BLUE (TOWEL DISPOSABLE) ×6 IMPLANT
TRAY FOLEY MTR SLVR 16FR STAT (SET/KITS/TRAYS/PACK) ×3 IMPLANT
WATER STERILE IRR 1000ML POUR (IV SOLUTION) ×3 IMPLANT
YANKAUER SUCT BULB TIP 10FT TU (MISCELLANEOUS) ×3 IMPLANT

## 2018-05-04 NOTE — Anesthesia Procedure Notes (Signed)
Procedure Name: Intubation Date/Time: 05/04/2018 7:39 AM Performed by: Talbot Grumbling, CRNA Pre-anesthesia Checklist: Patient identified, Emergency Drugs available, Suction available and Patient being monitored Patient Re-evaluated:Patient Re-evaluated prior to induction Oxygen Delivery Method: Circle system utilized Preoxygenation: Pre-oxygenation with 100% oxygen Induction Type: IV induction Ventilation: Mask ventilation without difficulty Laryngoscope Size: Mac and 3 Grade View: Grade I Tube type: Oral Tube size: 7.5 mm Number of attempts: 1 Airway Equipment and Method: Stylet Placement Confirmation: ETT inserted through vocal cords under direct vision,  positive ETCO2 and breath sounds checked- equal and bilateral Secured at: 22 cm Tube secured with: Tape Dental Injury: Teeth and Oropharynx as per pre-operative assessment

## 2018-05-04 NOTE — Op Note (Signed)
NAMESAGAN, MASELLI MEDICAL RECORD [id number] ACCOUNT [id number] DATE OF BIRTH:07-20-1932 FACILITY: [facility] LOCATION: [facility] PHYSICIAN:Anastasha Ortez Fransico Setters, MD  OPERATIVE REPORT  DATE OF PROCEDURE:  05/04/2018  SURGEON:  Kipp Brood. Gladstone Lighter, MD  ASSISTANT:  Ardeen Jourdain PA.  PREOPERATIVE DIAGNOSIS:   1.  Flexion contracture, left hip. 2.  Severe primary osteoarthritis with bone-on-bone left hip flexion contracture, left hip.  POSTOPERATIVE DIAGNOSIS:   1.  Flexion contracture, left hip. 2.  Severe primary osteoarthritis with bone-on-bone left hip flexion contracture, left hip.  PROCEDURES:   1.  Release of contractures, left hip. 2.  Left total hip arthroplasty utilizing the DePuy system.  I used a 52 mm Gription cup with the appropriate polyethylene insert.  The hole eliminator also was used in the cup with 1 screw for fixation.  The stem was a size 4 high offset Tri-Lock stem.   The neck was a 12 mm in length, 36 mm diameter ball.  DESCRIPTION OF PROCEDURE:  Under general anesthesia, routine orthopedic prep and draping the left lower extremity was carried out with the patient on the right side and the left side up.  The appropriate time-out was carried out.  I also marked the  appropriate left hip in the holding area.  The patient had 1 gram of IV vancomycin and 1 gram of TSA.  At this time, a posterior approach to the hip was carried out in the usual fashion.  Self-retaining retractors were inserted.  I then went down and  detached a portion of the external rotators.  I then did a capsulectomy.  The appropriate retractors were inserted.  I then dislocated the femoral head and amputated the femoral head at the appropriate neck length.  At this particular point, the box  osteotome was utilized to remove the cancellous bone from the trochanter.  Following that, I utilized a widening reamer.  I then inserted the canal finder and we were down in the canal very easily.  We then carried  out our rasping up to a size #4 rasp.   The rasp then was removed.  The canal was irrigated with antibiotic solution.  I then packed the canal open with a large sponge, which later was removed.  We then directed attention to the acetabulum.  I completed the capsulectomy and cleaned out the  acetabulum.  We then reamed the acetabulum up to a size 51 for a 52 mm cup.  The cup was a Gription cup and was inserted in the usual fashion.  I examined the alignment by utilizing the Charnley guide and we had excellent alignment.  At this particular  point, we then utilized 1 screw for fixation.  We utilized a 20 mm in length screw.  I did check the screw to make sure we did not penetrate the sciatic notch.  At that time, I then utilized the hole eliminator into the cup.  I then inserted my +4  polyethylene cup in the usual fashion.  Once we found that to be secure, we then went back and removed a large sponge and then inserted our size 4 high offset trial prosthesis and went through the various neck lengths.  The remaining stable length was a  plus 12 neck length.  This was a 36 mm ball.  We then went through all ranges of motion.  We checked the leg length as well.  We then removed the trial components and I inserted the permanent size 4 high offset Tri-Lock stem.  We  had an excellent  fixation.  Following that, I then inserted the +12 head and went through range of motion and finally selected a +12 metal head.  The neck length was a +12 as I mentioned and the diameter of the ball was a 36 mm ball.  We had good stability.  We irrigated  out the area and loosely packed some Gelfoam down into the wound site.  I closed the wound in layers in the usual fashion.  Sterile dressings were applied.    As I mentioned, he had 1 gram of IV Ancef at the beginning.  AN/NUANCE  D:05/04/2018 T:05/04/2018 JOB:[id number]

## 2018-05-04 NOTE — Progress Notes (Signed)
ANTICOAGULATION CONSULT NOTE - Initial Consult  Pharmacy Consult for Warfarin, Lovenox Indication: VTE prophylaxis  Allergies  Allergen Reactions  . Penicillins Hives and Rash    Has patient had a PCN reaction causing immediate rash, facial/tongue/throat swelling, SOB or lightheadedness with hypotension: Yes Has patient had a PCN reaction causing severe rash involving mucus membranes or skin necrosis: Yes Has patient had a PCN reaction that required hospitalization: No Has patient had a PCN reaction occurring within the last 10 years: No If all of the above answers are "NO", then may proceed with Cephalosporin use.   . Hct [Hydrochlorothiazide] Other (See Comments)    hyper  . Statins Other (See Comments)    Myalgia, tolerates low dosages of lovastatin     Patient Measurements: Height: 5\' 7"  (170.2 cm) Weight: 190 lb (86.2 kg) IBW/kg (Calculated) : 66.1  Vital Signs: Temp: 97.5 F (36.4 C) (07/24 1041) BP: 153/91 (07/24 1041) Pulse Rate: 78 (07/24 1041)  Labs: Recent Labs    05/04/18 0600  APTT 26  LABPROT 13.9  INR 1.08    Estimated Creatinine Clearance: 58.1 mL/min (by C-G formula based on SCr of 1.01 mg/dL).   Medical History: Past Medical History:  Diagnosis Date  . Anemia   . Atrial fibrillation ([place])   . Cataract   . Cellulitis    In past  . Complication of anesthesia    HARD TIME WAKING; CAUSES MY BP TO GO UP   . Coronary artery disease    5 bypasses  . DJD (degenerative joint disease)   . Ejection fraction < 50%    Mildly reduced, 40% by echo  . GERD (gastroesophageal reflux disease)   . Hyperlipidemia   . Hypertension   . Persistent atrial fibrillation ([place])   . Prostate hypertrophy    on CT scan 09/2014  . PUD (peptic ulcer disease)    RESOLVED   . Skin cancer of eyelid    Resected    Medications:  Medications Prior to Admission  Medication Sig Dispense Refill Last Dose  . acetaminophen (TYLENOL) 650 MG CR tablet Take 650 mg by mouth  every 8 (eight) hours as needed for pain.    05/01/2018  . carvedilol (COREG) 12.5 MG tablet TAKE 1&1/2 TABLET EVERY MORNING & TAKE 1 TABLET EVERY EVENING (Patient taking differently: Take 12.5 mg by mouth twice daily) 225 tablet 0 05/04/2018 at 0400  . Cholecalciferol (VITAMIN D3) 5000 units CAPS Take 5,000 Units by mouth every 30 (thirty) days.   04/27/2018  . diltiazem (CARDIZEM CD) 120 MG 24 hr capsule TAKE ONE (1) CAPSULE EACH DAY (Patient taking differently: Take 120 mg by mouth once daily at night) 90 capsule 0 05/04/2018 at 0400  . ezetimibe (ZETIA) 10 MG tablet TAKE ONE (1) TABLET EACH DAY 90 tablet 0 05/04/2018 at 0400  . finasteride (PROSCAR) 5 MG tablet TAKE ONE (1) TABLET EACH DAY 90 tablet 0 05/04/2018 at 0400  . fluticasone (FLONASE) 50 MCG/ACT nasal spray Place 1 spray into both nostrils daily as needed for allergies or rhinitis.   05/03/2018 at Unknown time  . furosemide (LASIX) 20 MG tablet Take 20 mg by mouth 4 (four) times a week.    05/03/2018 at Unknown time  . lovastatin (MEVACOR) 20 MG tablet Take 1 tablet (20 mg total) by mouth at bedtime. (Patient taking differently: Take 10 mg by mouth 2 (two) times a week. ) 90 tablet 1 05/03/2018 at Unknown time  . Polyethyl Glycol-Propyl Glycol (SYSTANE OP) Place  1 drop into both eyes daily.   05/03/2018 at Unknown time  . Polyethyl Glycol-Propyl Glycol (SYSTANE) 0.4-0.3 % GEL ophthalmic gel Place 1 application into the right eye at bedtime.    05/03/2018 at Unknown time  . warfarin (COUMADIN) 5 MG tablet Take 5 mg by mouth once daily on Sat, Sun, Tues, and Thurs.  Take 2.5 mg by mouth once daily on Mon, Wed, and Fri 90 tablet 1 04/28/2018    Assessment: 85 yoM admitted on 7/24 for Left THA.  PMH is significant for afib on warfarin.  Pharmacy is consulted to begin warfarin and Lovenox bridge on 7/25.  Home warfarin 5 mg daily except 2.5 mg on Monday, Wednesday, Friday.  Last dose taken on 7/18.  INR 1.08 (7/24) CBC: Hgb 14.2, Plt 23a6 (preop on  7/15) SCr 1, Crcl ~ 58 ml/min  Goal of Therapy:  INR 2-3 Monitor platelets by anticoagulation protocol: Yes   Plan:   Lovenox 1 mg/kg (85 mg) SQ q12h starting 08:00  Warfarin 7.5 mg PO x 1 dose on 7/25 at 08:00  Daily PT/INR.  Monitor for signs and symptoms of bleeding.  Gretta Arab PharmD, BCPS Pager [phone] 05/04/2018,10:45 AM

## 2018-05-04 NOTE — Plan of Care (Signed)
  Problem: Clinical Measurements: Goal: Will remain free from infection Outcome: Progressing   Problem: Clinical Measurements: Goal: Diagnostic test results will improve Outcome: Progressing   Problem: Clinical Measurements: Goal: Respiratory complications will improve Outcome: Progressing   Problem: Nutrition: Goal: Adequate nutrition will be maintained Outcome: Progressing   Problem: Health Behavior/Discharge Planning: Goal: Ability to manage health-related needs will improve Outcome: Progressing

## 2018-05-04 NOTE — Transfer of Care (Signed)
Immediate Anesthesia Transfer of Care Note  Patient: [name]  Procedure(s) Performed: LEFT TOTAL HIP ARTHROPLASTY (Left Hip)  Patient Location: PACU  Anesthesia Type:General  Level of Consciousness: sedated  Airway & Oxygen Therapy: Patient Spontanous Breathing and Patient connected to face mask oxygen  Post-op Assessment: Report given to RN and Post -op Vital signs reviewed and stable  Post vital signs: Reviewed and stable  Last Vitals:  Vitals Value Taken Time  BP 142/91 05/04/2018  9:22 AM  Temp    Pulse 72 05/04/2018  9:23 AM  Resp 8 05/04/2018  9:23 AM  SpO2 100 % 05/04/2018  9:23 AM  Vitals shown include unvalidated device data.  Last Pain:  Vitals:   05/04/18 0624  PainSc: 0-No pain      Patients Stated Pain Goal: 4 (24/09/73 5329)  Complications: No apparent anesthesia complications

## 2018-05-04 NOTE — Interval H&P Note (Signed)
History and Physical Interval Note:  05/04/2018 7:14 AM  [name]  has presented today for surgery, with the diagnosis of left hip osteoarthritis  The various methods of treatment have been discussed with the patient and family. After consideration of risks, benefits and other options for treatment, the patient has consented to  Procedure(s): LEFT TOTAL HIP ARTHROPLASTY (Left) as a surgical intervention .  The patient's history has been reviewed, patient examined, no change in status, stable for surgery.  I have reviewed the patient's chart and labs.  Questions were answered to the patient's satisfaction.     [name]

## 2018-05-04 NOTE — Brief Op Note (Signed)
05/04/2018  9:01 AM  PATIENT:  [name]  84 y.o. male  PRE-OPERATIVE DIAGNOSIS:  left hip Primary  Osteoarthritis and Flexion Contracture.  POST-OPERATIVE DIAGNOSIS:  Same as Pre-Op  PROCEDURE:  Procedure(s): LEFT TOTAL HIP ARTHROPLASTY (Left) and Release of Flexion Contracture.  SURGEON:  Surgeon(s) and Role:    * [name], MD - Primary  PHYSICIAN ASSISTANT: Ardeen Jourdain PA  ASSISTANTS: Ardeen Jourdain PA  ANESTHESIA:   general  EBL:  300 mL   BLOOD ADMINISTERED:none  DRAINS: none   LOCAL MEDICATIONS USED:  OTHER 20cc of Exparel mixed with 20 cc of Normal Saline.  SPECIMEN:  No Specimen  DISPOSITION OF SPECIMEN:  N/A  COUNTS:  YES  TOURNIQUET:  * No tourniquets in log *  DICTATION: .Other Dictation: Dictation Number [id number]  PLAN OF CARE: Admit to inpatient   PATIENT DISPOSITION:  Stable in the OR   Delay start of Pharmacological VTE agent (>24hrs) due to surgical blood loss or risk of bleeding: yes

## 2018-05-04 NOTE — H&P (Signed)
TOTAL HIP ADMISSION H&P  Patient is admitted for left total hip arthroplasty.  Subjective:  Chief Complaint: left hip pain  HPI: [name], 84 y.o. male, has a history of pain and functional disability in the left hip(s) due to arthritis and patient has failed non-surgical conservative treatments for greater than 12 weeks to include NSAID's and/or analgesics, corticosteriod injections, flexibility and strengthening excercises, use of assistive devices and activity modification.  Onset of symptoms was gradual starting 3 years ago with gradually worsening course since that time.The patient noted no past surgery on the left hip(s).  Patient currently rates pain in the left hip at 6 out of 10 with activity. Patient has night pain, worsening of pain with activity and weight bearing, pain that interfers with activities of daily living and pain with passive range of motion. Patient has evidence of subchondral cysts, subchondral sclerosis, periarticular osteophytes and joint space narrowing by imaging studies. This condition presents safety issues increasing the risk of falls.  There is no current active infection.  Patient Active Problem List   Diagnosis Date Noted  . Preop cardiovascular exam 12/16/2017  . Dyslipidemia 12/16/2017  . Stenosis of left carotid artery 12/16/2017  . Peripheral vascular insufficiency ([place]) 11/05/2017  . Primary osteoarthritis involving multiple joints 11/05/2017  . Vitamin D deficiency 11/05/2017  . Primary osteoarthritis of left knee 11/05/2017  . Chronic atrial fibrillation ([place]) 11/05/2017  . Left-sided carotid artery disease ([place]) 06/02/2017  . Coronary artery disease involving native coronary artery of native heart without angina pectoris 06/02/2017  . Chest pain with moderate risk of acute coronary syndrome 03/29/2017  . Right hip pain 02/12/2016  . Metabolic syndrome 13/24/4010  . Acute blood loss anemia 01/22/2015  . Urinary retention 01/21/2015  . Cataract  of both eyes 05/25/2014  . Long term current use of anticoagulant therapy 02/14/2013  . OVERWEIGHT 12/11/2009  . ANEMIA 02/25/2009  . Essential hypertension 02/25/2009  . Coronary atherosclerosis 02/25/2009  . Atrial fibrillation, persistent ([place]) 02/25/2009  . GERD 02/25/2009  . Osteoarthritis 02/25/2009   Past Medical History:  Diagnosis Date  . Anemia   . Atrial fibrillation ([place])   . Cataract   . Cellulitis    In past  . Complication of anesthesia    HARD TIME WAKING; CAUSES MY BP TO GO UP   . Coronary artery disease    5 bypasses  . DJD (degenerative joint disease)   . Ejection fraction < 50%    Mildly reduced, 40% by echo  . GERD (gastroesophageal reflux disease)   . Hyperlipidemia   . Hypertension   . Persistent atrial fibrillation ([place])   . Prostate hypertrophy    on CT scan 09/2014  . PUD (peptic ulcer disease)    RESOLVED   . Skin cancer of eyelid    Resected    Past Surgical History:  Procedure Laterality Date  . APPENDECTOMY    . BYPASS GRAFT  2005  . CATARACT EXTRACTION W/PHACO Left 07/22/2015   Procedure: CATARACT EXTRACTION PHACO AND INTRAOCULAR LENS PLACEMENT LEFT EYE CDE=8.14;  Surgeon: Tonny Branch, MD;  Location: [facility];  Service: Ophthalmology;  Laterality: Left;  . CORONARY ARTERY BYPASS GRAFT  4/05   LIMA to LAD, SVG to PDA, SVG to ramus intermediate  . EYE SURGERY  07/2015  . EYELID CARCINOMA EXCISION     Skin cancer resected  . Heart bypass  2005  . TOTAL HIP ARTHROPLASTY     Right  . TOTAL KNEE ARTHROPLASTY  Right    Current Facility-Administered Medications  Medication Dose Route Frequency Provider Last Rate Last Dose  . bupivacaine liposome (EXPAREL) 1.3 % injection 266 mg  20 mL Infiltration Once [name], MD       Current Outpatient Medications  Medication Sig Dispense Refill Last Dose  . acetaminophen (TYLENOL) 650 MG CR tablet Take 650 mg by mouth every 8 (eight) hours as needed for pain.    Taking  . carvedilol (COREG)  12.5 MG tablet TAKE 1&1/2 TABLET EVERY MORNING & TAKE 1 TABLET EVERY EVENING (Patient taking differently: Take 12.5 mg by mouth twice daily) 225 tablet 0 Taking  . Cholecalciferol (VITAMIN D3) 5000 units CAPS Take 5,000 Units by mouth every 30 (thirty) days.     Marland Kitchen diltiazem (CARDIZEM CD) 120 MG 24 hr capsule TAKE ONE (1) CAPSULE EACH DAY (Patient taking differently: Take 120 mg by mouth once daily at night) 90 capsule 0 Taking  . ezetimibe (ZETIA) 10 MG tablet TAKE ONE (1) TABLET EACH DAY 90 tablet 0 Taking  . finasteride (PROSCAR) 5 MG tablet TAKE ONE (1) TABLET EACH DAY 90 tablet 0 Taking  . fluticasone (FLONASE) 50 MCG/ACT nasal spray Place 1 spray into both nostrils daily as needed for allergies or rhinitis.     . furosemide (LASIX) 20 MG tablet Take 20 mg by mouth 4 (four) times a week.    Taking  . lovastatin (MEVACOR) 20 MG tablet Take 1 tablet (20 mg total) by mouth at bedtime. (Patient taking differently: Take 10 mg by mouth 2 (two) times a week. ) 90 tablet 1 Taking  . Polyethyl Glycol-Propyl Glycol (SYSTANE OP) Place 1 drop into both eyes daily.     Vladimir Faster Glycol-Propyl Glycol (SYSTANE) 0.4-0.3 % GEL ophthalmic gel Place 1 application into the right eye at bedtime.    Taking  . warfarin (COUMADIN) 5 MG tablet Take 5 mg by mouth once daily on Sat, Sun, Tues, and Thurs.  Take 2.5 mg by mouth once daily on Mon, Wed, and Fri 90 tablet 1    Allergies  Allergen Reactions  . Penicillins Hives and Rash    Has patient had a PCN reaction causing immediate rash, facial/tongue/throat swelling, SOB or lightheadedness with hypotension: Yes Has patient had a PCN reaction causing severe rash involving mucus membranes or skin necrosis: Yes Has patient had a PCN reaction that required hospitalization: No Has patient had a PCN reaction occurring within the last 10 years: No If all of the above answers are "NO", then may proceed with Cephalosporin use.   . Hct [Hydrochlorothiazide] Other (See  Comments)    hyper  . Statins Other (See Comments)    Myalgia, tolerates low dosages of lovastatin     Social History   Tobacco Use  . Smoking status: Former Smoker    Packs/day: 2.00    Years: 5.00    Pack years: 10.00    Types: Cigarettes    Last attempt to quit: 12/23/1963    Years since quitting: 54.4  . Smokeless tobacco: Never Used  Substance Use Topics  . Alcohol use: No    Family History  Problem Relation Age of Onset  . Stroke Mother   . Stroke Father   . Hypertension Father   . Early death Brother        51 months old  . Cancer Brother        lung  . Stroke Brother        heat  Review of Systems  Constitutional: Negative.   HENT: Positive for hearing loss. Negative for congestion, ear discharge, ear pain, nosebleeds, sinus pain, sore throat and tinnitus.   Respiratory: Positive for shortness of breath. Negative for cough, hemoptysis, sputum production, wheezing and stridor.   Cardiovascular: Negative.   Gastrointestinal: Negative.   Genitourinary: Negative.   Musculoskeletal: Positive for joint pain and myalgias. Negative for back pain, falls and neck pain.  Skin: Negative.   Neurological: Negative.   Endo/Heme/Allergies: Negative.   Psychiatric/Behavioral: Negative.     Objective:  Physical Exam  Constitutional: He is oriented to person, place, and time. He appears well-developed.  HENT:  Head: Normocephalic and atraumatic.  Right Ear: External ear normal.  Left Ear: External ear normal.  Nose: Nose normal.  Mouth/Throat: Oropharynx is clear and moist.  Eyes: Conjunctivae and EOM are normal.  Neck: Normal range of motion. Neck supple.  Cardiovascular: Normal rate, regular rhythm, normal heart sounds and intact distal pulses.  No murmur heard. Respiratory: Effort normal and breath sounds normal. No respiratory distress. He has no wheezes.  GI: Soft. Bowel sounds are normal. He exhibits no distension. There is no tenderness.  Musculoskeletal:        Right knee: Normal.       Left knee: Normal.  Right hip 130 flexion, 30 internal rotation, 40 external rotation and abduction with no pain. Left hip 100 flexion, 10 internal rotation, 20 external rotation and abduction with pain.   Neurological: He is alert and oriented to person, place, and time. He has normal strength. No sensory deficit.  Skin: No rash noted. He is not diaphoretic. No erythema.  Psychiatric: He has a normal mood and affect. His behavior is normal.    Ht: 5 ft 7 in  Wt: 180 lbs  BMI: 28.2  BP: 140/90  Pulse: 64 bpm regular  Pain Scale: 6   Imaging Review Plain radiographs demonstrate severe degenerative joint disease of the left hip(s). The bone quality appears to be good for age and reported activity level.    Preoperative templating of the joint replacement has been completed, documented, and submitted to the Operating Room personnel in order to optimize intra-operative equipment management.     Assessment/Plan:  End stage primary osteoarthritis, left hip(s)  The patient history, physical examination, clinical judgement of the provider and imaging studies are consistent with end stage degenerative joint disease of the left hip(s) and total hip arthroplasty is deemed medically necessary. The treatment options including medical management, injection therapy, arthroscopy and arthroplasty were discussed at length. The risks and benefits of total hip arthroplasty were presented and reviewed. The risks due to aseptic loosening, infection, stiffness, dislocation/subluxation,  thromboembolic complications and other imponderables were discussed.  The patient acknowledged the explanation, agreed to proceed with the plan and consent was signed. Patient is being admitted for inpatient treatment for surgery, pain control, PT, OT, prophylactic antibiotics, VTE prophylaxis, progressive ambulation and ADL's and discharge planning.The patient is planning to be discharged  home with home health services    Therapy Plans: HHPT Disposition: Home with wife Planned DVT prophylaxis: resume Warfarin DME needed: none PCP: Dr. Laurance Flatten Cardio: Dr. Percival Spanish Other: TXA IV  Ardeen Jourdain, PA-C

## 2018-05-04 NOTE — Evaluation (Signed)
Physical Therapy Evaluation Patient Details Name: [name] MRN: [id number] DOB: Jun 24, 1932 Today's Date: 05/04/2018   History of Present Illness  L THA secondary to hip OA. PMH including R THA, R TKA, cardiac bypass surgery in 2005.   Clinical Impression  Pt is a 84 YO male s/p L THA on 7/24. Pt with difficulty with ambulation and bed mobility skills. Pt required verbal cuing to maintain precautions, especially when moving from supine to sitting EOB. Pt tolerated mobility well with little increase in hip pain. Pt to continue to require acute PT to address deficits. Recommending home health PT upon d/c.         Follow Up Recommendations Follow surgeon's recommendation for DC plan and follow-up therapies    Equipment Recommendations  3in1 (PT)    Recommendations for Other Services OT consult     Precautions / Restrictions Precautions Precautions: Posterior Hip Precaution Comments: Handout administered  Required Braces or Orthoses: Knee Immobilizer - Left Knee Immobilizer - Left: Discontinue post op day 2 Restrictions Weight Bearing Restrictions: Yes LLE Weight Bearing: Partial weight bearing LLE Partial Weight Bearing Percentage or Pounds: 50%      Mobility  Bed Mobility Overal bed mobility: Needs Assistance Bed Mobility: Supine to Sit     Supine to sit: Min assist;Mod assist;+2 for safety/equipment;+2 for physical assistance     General bed mobility comments: min assist for pivoting LLE and scooting to EOB, mod assist for trunk support and elevating shoulders from bed. verbal cuing for maintaining precautions  Transfers Overall transfer level: Needs assistance Equipment used: Rolling walker (2 wheeled) Transfers: Sit to/from Stand Sit to Stand: Min assist         General transfer comment: verbal cuing to maintain hip precautions  Ambulation/Gait Ambulation/Gait assistance: Min assist;+2 safety/equipment(chair follow for 2nd half of ambulation) Gait  Distance (Feet): 10 Feet Assistive device: Rolling walker (2 wheeled) Gait Pattern/deviations: Step-to pattern;Decreased step length - right;Decreased step length - left;Decreased stance time - left;Decreased stride length Gait velocity: decreased    General Gait Details: UE use to support LLE WB status   Stairs            Wheelchair Mobility    Modified Rankin (Stroke Patients Only)       Balance       Sitting balance - Comments: sitting balance not formally assessed, but sat EOB without UE or external support    Standing balance support: Bilateral upper extremity supported                                 Pertinent Vitals/Pain Pain Assessment: 0-10 Pain Score: 5  Pain Location: L hip  Pain Descriptors / Indicators: Aching Pain Intervention(s): Limited activity within patient's tolerance;Monitored during session;Ice applied    Home Living Family/patient expects to be discharged to:: Private residence Living Arrangements: Spouse/significant other Available Help at Discharge: Home health;Family Type of Home: House Home Access: Stairs to enter Entrance Stairs-Rails: Right Entrance Stairs-Number of Steps: 3 Home Layout: One level Home Equipment: [place] - 2 wheels;Cane - single point;Crutches      Prior Function Level of Independence: Independent with assistive device(s)   Gait / Transfers Assistance Needed: uses cane occasionally           Hand Dominance        Extremity/Trunk Assessment        Lower Extremity Assessment Lower Extremity Assessment: LE deficits  Communication   Communication: No difficulties  Cognition Arousal/Alertness: Awake/alert Behavior During Therapy: WFL for tasks assessed/performed Overall Cognitive Status: Within Functional Limits for tasks assessed                                        General Comments      Exercises     Assessment/Plan    PT Assessment Patient needs  continued PT services  PT Problem List Decreased activity tolerance;Decreased safety awareness;Decreased mobility       PT Treatment Interventions Gait training;Stair training;Functional mobility training;Patient/family education;Therapeutic activities;Therapeutic exercise    PT Goals (Current goals can be found in the Care Plan section)  Acute Rehab PT Goals Patient Stated Goal: increased mobility  PT Goal Formulation: With patient/family Time For Goal Achievement: 05/11/18 Potential to Achieve Goals: Good    Frequency 7X/week   Barriers to discharge        Co-evaluation               AM-PAC PT "6 Clicks" Daily Activity  Outcome Measure Difficulty turning over in bed (including adjusting bedclothes, sheets and blankets)?: Unable Difficulty moving from lying on back to sitting on the side of the bed? : Unable Difficulty sitting down on and standing up from a chair with arms (e.g., wheelchair, bedside commode, etc,.)?: Unable Help needed moving to and from a bed to chair (including a wheelchair)?: A Little Help needed walking in hospital room?: A Little Help needed climbing 3-5 steps with a railing? : A Lot 6 Click Score: 11    End of Session Equipment Utilized During Treatment: Gait belt;Left knee immobilizer Activity Tolerance: Patient limited by pain;Patient limited by fatigue Patient left: with chair alarm set;in chair;with family/visitor present Nurse Communication: Mobility status PT Visit Diagnosis: Difficulty in walking, not elsewhere classified (R26.2)    Time: 1511-1540 PT Time Calculation (min) (ACUTE ONLY): 29 min   Charges:   PT Evaluation $PT Eval Low Complexity: 1 Low PT Treatments $Therapeutic Activity: 8-22 mins   PT G Codes:       Liat Mayol D Cameshia Cressman PT DPT    Shanquita Ronning D Rajvi Armentor 05/04/2018, 4:20 PM

## 2018-05-04 NOTE — Anesthesia Postprocedure Evaluation (Signed)
Anesthesia Post Note  Patient: [name]  Procedure(s) Performed: LEFT TOTAL HIP ARTHROPLASTY (Left Hip)     Patient location during evaluation: PACU Anesthesia Type: General Level of consciousness: awake and alert, oriented and patient cooperative Pain management: pain level controlled Vital Signs Assessment: post-procedure vital signs reviewed and stable Respiratory status: spontaneous breathing, nonlabored ventilation, respiratory function stable and patient connected to nasal cannula oxygen Cardiovascular status: blood pressure returned to baseline and stable : nausea resolved. Anesthetic complications: no    Last Vitals:  Vitals:   05/04/18 1615 05/04/18 1748  BP: 120/84 124/82  Pulse: 87 85  Resp: 14 14  Temp:  36.9 C  SpO2: 94% 96%    Last Pain:  Vitals:   05/04/18 1900  TempSrc:   PainSc: 2                  Thomasina Housley,E. Wylan Gentzler

## 2018-05-04 NOTE — Discharge Instructions (Addendum)
Dr. [name] [facility] [address] [place], [place] [address] [phone]   POSTERIOR TOTAL HIP REPLACEMENT POSTOPERATIVE DIRECTIONS  Hip Rehabilitation, Guidelines Following Surgery  The results of a hip operation are greatly improved after range of motion and muscle strengthening exercises. Follow all safety measures which are given to protect your hip. If any of these exercises cause increased pain or swelling in your joint, decrease the amount until you are comfortable again. Then slowly increase the exercises. Call your caregiver if you have problems or questions.   HOME CARE INSTRUCTIONS  Remove items at home which could result in a fall. This includes throw rugs or furniture in walking pathways.   ICE to the affected hip every three hours for 30 minutes at a time and then as needed for pain and swelling.  Continue to use ice on the hip for pain and swelling from surgery. You may notice swelling that will progress down to the foot and ankle.  This is normal after surgery.  Elevate the leg when you are not up walking on it.    Continue to use the breathing machine which will help keep your temperature down.  It is common for your temperature to cycle up and down following surgery, especially at night when you are not up moving around and exerting yourself.  The breathing machine keeps your lungs expanded and your temperature down.  DIET You may resume your previous home diet once your are discharged from the hospital.  DRESSING / WOUND CARE / SHOWERING Keep the surgical dressing until follow up.  The dressing is water proof, so you can shower without any extra covering.  IF THE DRESSING FALLS OFF or the wound gets wet inside, change the dressing with sterile gauze.  Please use good hand washing techniques before changing the dressing.  Do not use any lotions or creams on the incision until instructed by your surgeon.   You may start showering once you are  discharged home but do not submerge the incision under water. Just pat the incision dry and apply a dry gauze dressing on daily. Change the surgical dressing daily and reapply a dry dressing each time.    ACTIVITY Walk with your walker as instructed. Use walker as long as suggested by your caregivers. Avoid periods of inactivity such as sitting longer than an hour when not asleep. This helps prevent blood clots.  You may resume a sexual relationship in one month or when given the OK by your doctor.  You may return to work once you are cleared by your doctor.  Do not drive a car for 6 weeks or until released by you surgeon.  Do not drive while taking narcotics.  WEIGHT BEARING Weight bearing as tolerated with assist device (walker, cane, etc) as directed, use it as long as suggested by your surgeon or therapist, typically at least 4-6 weeks.  POSTOPERATIVE CONSTIPATION PROTOCOL Constipation - defined medically as fewer than three stools per week and severe constipation as less than one stool per week.  One of the most common issues patients have following surgery is constipation.  Even if you have a regular bowel pattern at home, your normal regimen is likely to be disrupted due to multiple reasons following surgery.  Combination of anesthesia, postoperative narcotics, change in appetite and fluid intake all can affect your bowels.  In order to avoid complications following surgery, here are some recommendations in order to help you during your recovery period.  Colace (docusate) - Pick up an over-the-counter form of Colace or another stool softener and take twice a day as long as you are requiring postoperative pain medications.  Take with a full glass of water daily.  If you experience loose stools or diarrhea, hold the colace until you stool forms back up.  If your symptoms do not get better within 1 week or if they get worse, check with your doctor.  Dulcolax (bisacodyl) - Pick up  over-the-counter and take as directed by the product packaging as needed to assist with the movement of your bowels.  Take with a full glass of water.  Use this product as needed if not relieved by Colace only.   MiraLax (polyethylene glycol) - Pick up over-the-counter to have on hand.  MiraLax is a solution that will increase the amount of water in your bowels to assist with bowel movements.  Take as directed and can mix with a glass of water, juice, soda, coffee, or tea.  Take if you go more than two days without a movement. Do not use MiraLax more than once per day. Call your doctor if you are still constipated or irregular after using this medication for 7 days in a row.  If you continue to have problems with postoperative constipation, please contact the office for further assistance and recommendations.  If you experience "the worst abdominal pain ever" or develop nausea or vomiting, please contact the office immediatly for further recommendations for treatment.  ITCHING  If you experience itching with your medications, try taking only a single pain pill, or even half a pain pill at a time.  You can also use Benadryl over the counter for itching or also to help with sleep.   TED HOSE STOCKINGS Wear the elastic stockings on both legs for three weeks following surgery during the day but you may remove then at night for sleeping.  MEDICATIONS See your medication summary on the After Visit Summary that the nursing staff will review with you prior to discharge.  You may have some home medications which will be placed on hold until you complete the course of blood thinner medication.  It is important for you to complete the blood thinner medication as prescribed by your surgeon.  Continue your approved medications as instructed at time of discharge.  PRECAUTIONS If you experience chest pain or shortness of breath - call 911 immediately for transfer to the hospital emergency department.  If you  develop a fever greater that 101 F, purulent drainage from wound, increased redness or drainage from wound, foul odor from the wound/dressing, or calf pain - CONTACT YOUR SURGEON.                                                   FOLLOW-UP APPOINTMENTS Make sure you keep all of your appointments after your operation with your surgeon and caregivers. You should call the office at the above phone number and make an appointment for approximately two weeks after the date of your surgery or on the date instructed by your surgeon outlined in the "After Visit Summary".  RANGE OF MOTION AND STRENGTHENING EXERCISES  These exercises are designed to help you keep full movement of your hip joint. Follow your caregiver's or physical therapist's instructions. Perform all exercises about fifteen times, three times per day or as  directed. Exercise both hips, even if you have had only one joint replacement. These exercises can be done on a training (exercise) mat, on the floor, on a table or on a bed. Use whatever works the best and is most comfortable for you. Use music or television while you are exercising so that the exercises are a pleasant break in your day. This will make your life better with the exercises acting as a break in routine you can look forward to.  Lying on your back, slowly slide your foot toward your buttocks, raising your knee up off the floor. Then slowly slide your foot back down until your leg is straight again.  Lying on your back spread your legs as far apart as you can without causing discomfort.  Lying on your side, raise your upper leg and foot straight up from the floor as far as is comfortable. Slowly lower the leg and repeat.  Lying on your back, tighten up the muscle in the front of your thigh (quadriceps muscles). You can do this by keeping your leg straight and trying to raise your heel off the floor. This helps strengthen the largest muscle supporting your knee.  Lying on your back,  tighten up the muscles of your buttocks both with the legs straight and with the knee bent at a comfortable angle while keeping your heel on the floor.      IF YOU ARE TRANSFERRED TO A SKILLED REHAB FACILITY If the patient is transferred to a skilled rehab facility following release from the hospital, a list of the current medications will be sent to the facility for the patient to continue.  When discharged from the skilled rehab facility, please have the facility set up the patient's [place] prior to being released. Also, the skilled facility will be responsible for providing the patient with their medications at time of release from the facility to include their pain medication, the muscle relaxants, and their blood thinner medication. If the patient is still at the rehab facility at time of the two week follow up appointment, the skilled rehab facility will also need to assist the patient in arranging follow up appointment in our office and any transportation needs.  MAKE SURE YOU:  Understand these instructions.  Get help right away if you are not doing well or get worse.    Pick up stool softner and laxative for home use following surgery while on pain medications. Do not submerge incision under water. Please use good hand washing techniques while changing dressing each day. May shower starting three days after surgery. Please use a clean towel to pat the incision dry following showers. Continue to use ice for pain and swelling after surgery. Do not use any lotions or creams on the incision until instructed by your surgeon.

## 2018-05-04 NOTE — Interval H&P Note (Signed)
History and Physical Interval Note:  05/04/2018 7:11 AM  [name]  has presented today for surgery, with the diagnosis of left hip osteoarthritis  The various methods of treatment have been discussed with the patient and family. After consideration of risks, benefits and other options for treatment, the patient has consented to  Procedure(s): LEFT TOTAL HIP ARTHROPLASTY (Left) as a surgical intervention .  The patient's history has been reviewed, patient examined, no change in status, stable for surgery.  I have reviewed the patient's chart and labs.  Questions were answered to the patient's satisfaction.     [name]

## 2018-05-04 NOTE — Interval H&P Note (Signed)
History and Physical Interval Note:  05/04/2018 7:13 AM  [name]  has presented today for surgery, with the diagnosis of left hip osteoarthritis  The various methods of treatment have been discussed with the patient and family. After consideration of risks, benefits and other options for treatment, the patient has consented to  Procedure(s): LEFT TOTAL HIP ARTHROPLASTY (Left) as a surgical intervention .  The patient's history has been reviewed, patient examined, no change in status, stable for surgery.  I have reviewed the patient's chart and labs.  Questions were answered to the patient's satisfaction.     [name]

## 2018-05-05 ENCOUNTER — Encounter (HOSPITAL_COMMUNITY): Payer: Self-pay | Admitting: Orthopedic Surgery

## 2018-05-05 LAB — BASIC METABOLIC PANEL
ANION GAP: 6 (ref 5–15)
BUN: 14 mg/dL (ref 8–23)
CO2: 28 mmol/L (ref 22–32)
Calcium: 8.6 mg/dL — ABNORMAL LOW (ref 8.9–10.3)
Chloride: 105 mmol/L (ref 98–111)
Creatinine, Ser: 0.91 mg/dL (ref 0.61–1.24)
GFR calc Af Amer: >60 mL/min (ref >60)
GFR calc non Af Amer: >60 mL/min (ref >60)
Glucose, Bld: 157 mg/dL — ABNORMAL HIGH (ref 70–99)
Potassium: 3.9 mmol/L (ref 3.5–5.1)
Sodium: 139 mmol/L (ref 135–145)

## 2018-05-05 LAB — CBC
HCT: 38.3 % — ABNORMAL LOW (ref 39.0–52.0)
Hemoglobin: 12.5 g/dL — ABNORMAL LOW (ref 13.0–17.0)
MCH: 32.9 pg (ref 26.0–34.0)
MCHC: 32.6 g/dL (ref 30.0–36.0)
MCV: 100.8 fL — ABNORMAL HIGH (ref 78.0–100.0)
Platelets: 208 10*3/uL (ref 150–400)
RBC: 3.8 MIL/uL — AB (ref 4.22–5.81)
RDW: 13.2 % (ref 11.5–15.5)
WBC: 20.9 10*3/uL — ABNORMAL HIGH (ref 4.0–10.5)

## 2018-05-05 LAB — PROTIME-INR
INR: 1.12
Prothrombin Time: 14.4 seconds (ref 11.4–15.2)

## 2018-05-05 MED ORDER — PANTOPRAZOLE SODIUM 40 MG PO TBEC
40.0000 mg | DELAYED_RELEASE_TABLET | Freq: Every day | ORAL | Status: DC
Start: 2018-05-05 — End: 2018-05-13
  Administered 2018-05-05 – 2018-05-13 (×9): 40 mg via ORAL
  Filled 2018-05-05 (×9): qty 1

## 2018-05-05 MED ORDER — CALCIUM CARBONATE ANTACID 500 MG PO CHEW
1.0000 | CHEWABLE_TABLET | Freq: Three times a day (TID) | ORAL | Status: DC | PRN
  Administered 2018-05-05 – 2018-05-07 (×3): 200 mg via ORAL
  Filled 2018-05-05 (×3): qty 1

## 2018-05-05 NOTE — Plan of Care (Signed)
Plan of care discussed with patient 

## 2018-05-05 NOTE — Progress Notes (Signed)
[facility] for Warfarin, Lovenox Indication: VTE prophylaxis  Allergies  Allergen Reactions  . Penicillins Hives and Rash    Has patient had a PCN reaction causing immediate rash, facial/tongue/throat swelling, SOB or lightheadedness with hypotension: Yes Has patient had a PCN reaction causing severe rash involving mucus membranes or skin necrosis: Yes Has patient had a PCN reaction that required hospitalization: No Has patient had a PCN reaction occurring within the last 10 years: No If all of the above answers are "NO", then may proceed with Cephalosporin use.   . Hct [Hydrochlorothiazide] Other (See Comments)    hyper  . Statins Other (See Comments)    Myalgia, tolerates low dosages of lovastatin     Patient Measurements: Height: 5\' 7"  (170.2 cm) Weight: 190 lb (86.2 kg) IBW/kg (Calculated) : 66.1  Vital Signs: Temp: 98.3 F (36.8 C) (07/25 0604) Temp Source: Oral (07/25 0604) BP: 119/81 (07/25 0604) Pulse Rate: 74 (07/25 0604)  Labs: Recent Labs    05/04/18 0600 05/05/18 0529  HGB  --  12.5*  HCT  --  38.3*  PLT  --  208  APTT 26  --   LABPROT 13.9 14.4  INR 1.08 1.12  CREATININE  --  0.91    Estimated Creatinine Clearance: 64.5 mL/min (by C-G formula based on SCr of 0.91 mg/dL).   Assessment: 19 yoM admitted on 7/24 for Left THA.  PMH is significant for afib on warfarin.  Pharmacy is consulted to begin warfarin and Lovenox bridge on 7/25.  Home warfarin 5 mg daily except 2.5 mg on Monday, Wednesday, Friday.  Last dose taken on 7/18.  Today, 05/05/2018:  INR 1.12 CBC: Hgb 12.5, Plt 208 SCr 0.91, Crcl ~ 79ml/min No major drug-drug interactions noted  Goal of Therapy:  INR 2-3 Monitor platelets by anticoagulation protocol: Yes   Plan:   Lovenox 1 mg/kg (85 mg) SQ q12h starting 08:00  Warfarin 7.5 mg PO x 1 dose today, re-evaluate INR 7/26 before dosing.  Daily PT/INR.  Monitor for signs and symptoms of  bleeding.  Gretta Arab PharmD, BCPS Pager [phone] 05/05/2018,7:33 AM

## 2018-05-05 NOTE — Progress Notes (Signed)
Physical Therapy Treatment Patient Details Name: [name] MRN: [id number] DOB: [birth year] Today's Date: 05/05/2018    History of Present Illness L THA secondary to hip OA. PMH including R THA, R TKA, cardiac bypass surgery in 2005.     PT Comments     Pt tolerated increased ambulation today, progressing to 90 ft with no standing rest breaks required. Pt with some unsteadiness upon starting ambulation, which was monitored and resolved. Pt consistently followed precautions, initially needing verbal cuing to maintain hip flexion precautions. Pt needs continued reinforcements of hip precautions for safety. Pt continues to require acute PT to address deficits.    Follow Up Recommendations  Follow surgeon's recommendation for DC plan and follow-up therapies     Equipment Recommendations  3in1 (PT)    Recommendations for Other Services OT consult     Precautions / Restrictions Precautions Precautions: Posterior Hip(2/3 precautions successfully recalled by pt) Restrictions Weight Bearing Restrictions: Yes LLE Weight Bearing: Partial weight bearing LLE Partial Weight Bearing Percentage or Pounds: 50%    Mobility  Bed Mobility         Supine to sit: Min assist     General bed mobility comments: assist for LLE  Transfers Overall transfer level: Needs assistance Equipment used: Rolling walker (2 wheeled)   Sit to Stand: Min assist         General transfer comment: verbal cuing for hip precautions, min assist for trunk elevation and LE management   Ambulation/Gait Ambulation/Gait assistance: Min assist Gait Distance (Feet): 90 Feet Assistive device: Rolling walker (2 wheeled) Gait Pattern/deviations: Step-to pattern;Decreased step length - right;Decreased step length - left;Antalgic;Trunk flexed Gait velocity: decreased    General Gait Details: mod verbal cuing to look forward, min assist at beginning of amb for steadying    Stairs              Wheelchair Mobility    Modified Rankin (Stroke Patients Only)       Balance Overall balance assessment: Mild deficits observed, not formally tested     Sitting balance - Comments: sitting balance not formally assessed, but sat EOB without UE or external support    Standing balance support: Bilateral upper extremity supported                                Cognition Arousal/Alertness: Awake/alert Behavior During Therapy: WFL for tasks assessed/performed Overall Cognitive Status: Within Functional Limits for tasks assessed                                        Exercises Total Joint Exercises Ankle Circles/Pumps: AROM;Both;20 reps;Supine(chair leaned back maximally for all exercises) Quad Sets: AROM;Left;10 reps Heel Slides: AROM;AAROM;Left;20 reps;Supine Hip ABduction/ADduction: 15 reps;AROM;Supine    General Comments        Pertinent Vitals/Pain Pain Assessment: 0-10 Pain Score: 4  Pain Location: L hip  Pain Descriptors / Indicators: Aching;Burning Pain Intervention(s): Ice applied;Monitored during session;Premedicated before session    Home Living Family/patient expects to be discharged to:: Private residence Living Arrangements: Spouse/significant other Available Help at Discharge: Home health;Family         Home Equipment: Shower seat      Prior Function Level of Independence: Independent with assistive device(s)  Gait / Transfers Assistance Needed: uses cane occasionally       PT Goals (  current goals can now be found in the care plan section) Acute Rehab PT Goals Patient Stated Goal: increased mobility  PT Goal Formulation: With patient/family Time For Goal Achievement: 05/11/18 Potential to Achieve Goals: Good    Frequency    7X/week      PT Plan Current plan remains appropriate    Co-evaluation              AM-PAC PT "6 Clicks" Daily Activity  Outcome Measure  Difficulty turning over in bed  (including adjusting bedclothes, sheets and blankets)?: Unable Difficulty moving from lying on back to sitting on the side of the bed? : Unable Difficulty sitting down on and standing up from a chair with arms (e.g., wheelchair, bedside commode, etc,.)?: Unable Help needed moving to and from a bed to chair (including a wheelchair)?: A Little Help needed walking in hospital room?: A Little Help needed climbing 3-5 steps with a railing? : A Lot 6 Click Score: 11    End of Session   Activity Tolerance: Patient limited by pain;Patient limited by fatigue;Patient tolerated treatment well Patient left: with chair alarm set;in chair;with family/visitor present;with call bell/phone within reach Nurse Communication: Mobility status PT Visit Diagnosis: Difficulty in walking, not elsewhere classified (R26.2)     Time: 9987-2158 PT Time Calculation (min) (ACUTE ONLY): 23 min  Charges:  $Gait Training: 8-22 mins $Therapeutic Exercise: 8-22 mins                    G Codes:  Functional Assessment Tool Used: AM-PAC 6 Clicks Basic Mobility    Saundra Gin Conception Chancy, PT DPT    Marijean Montanye D Bach Rocchi 05/05/2018, 10:29 AM

## 2018-05-05 NOTE — Progress Notes (Addendum)
Physical Therapy Treatment Patient Details Name: [name] MRN: [id number] DOB: 1932/03/13 Today's Date: 05/05/2018    History of Present Illness L THA secondary to hip OA. PMH including R THA, R TKA, cardiac bypass surgery in 2005.     PT Comments    Pt with some reminders about hip precautions today, but improved recall. Pt with increased ambulation distance during this session, with improved activity tolerance seen in lack of increase in pain during session. Pt continues to require acute PT to address deficits. Stair navigation needed next session prior to d/c.   Follow Up Recommendations  Follow surgeon's recommendation for DC plan and follow-up therapies     Equipment Recommendations  3in1 (PT)    Recommendations for Other Services OT consult     Precautions / Restrictions  L posterior hip precautions, WBAT as per Dr. Gladstone Lighter   Mobility  Bed Mobility                  Transfers Overall transfer level: Needs assistance Equipment used: Rolling walker (2 wheeled)   Sit to Stand: Min assist         General transfer comment: verbal cuing for hip precautions, min assist for trunk elevation and LE management   Ambulation/Gait Ambulation/Gait assistance: Min guard Gait Distance (Feet): 120 Feet Assistive device: Rolling walker (2 wheeled) Gait Pattern/deviations: Step-to pattern;Decreased step length - right;Decreased step length - left;Trunk flexed Gait velocity: decreased    General Gait Details: mod verbal cuing to look forward, min assist at beginning of amb for steadying, verbal cues for external rotation at hip during gait    Stairs             Wheelchair Mobility    Modified Rankin (Stroke Patients Only)       Balance Overall balance assessment: Mild deficits observed, not formally tested     Sitting balance - Comments: sitting balance not formally assessed, but sat EOB without UE or external support    Standing balance support:  Bilateral upper extremity supported                                Cognition Arousal/Alertness: Awake/alert Behavior During Therapy: WFL for tasks assessed/performed Overall Cognitive Status: Within Functional Limits for tasks assessed                                        Exercises      General Comments        Pertinent Vitals/Pain Pain Assessment: 0-10 Pain Score: 4  Pain Descriptors / Indicators: Aching;Burning Pain Intervention(s): Monitored during session;Ice applied    Home Living                      Prior Function            PT Goals (current goals can now be found in the care plan section) Acute Rehab PT Goals Patient Stated Goal: progress mobility PT Goal Formulation: With patient/family Time For Goal Achievement: 05/12/18 Potential to Achieve Goals: Good Progress towards PT goals: Progressing toward goals    Frequency    7X/week      PT Plan Current plan remains appropriate    Co-evaluation              AM-PAC PT "6 Clicks" Daily Activity  Outcome Measure  Difficulty turning over in bed (including adjusting bedclothes, sheets and blankets)?: Unable Difficulty moving from lying on back to sitting on the side of the bed? : Unable Difficulty sitting down on and standing up from a chair with arms (e.g., wheelchair, bedside commode, etc,.)?: Unable Help needed moving to and from a bed to chair (including a wheelchair)?: A Little Help needed walking in hospital room?: A Little Help needed climbing 3-5 steps with a railing? : A Lot 6 Click Score: 11    End of Session   Activity Tolerance: Patient limited by fatigue;Patient tolerated treatment well Patient left: with chair alarm set;in chair;with family/visitor present;with call bell/phone within reach;with bed alarm set Nurse Communication: Mobility status PT Visit Diagnosis: Difficulty in walking, not elsewhere classified (R26.2)     Time:  5072-2575 PT Time Calculation (min) (ACUTE ONLY): 20 min  Charges:  $Gait Training: 8-22 mins                     Christoher Drudge PT DPT    Indie Nickerson D Arda Daggs 05/05/2018, 3:04 PM

## 2018-05-05 NOTE — Care Management Note (Signed)
Case Management Note  Patient Details  Name: [name] MRN: [id number] Date of Birth: 1932-06-16  Subjective/Objective:Patient staes she plans to d/c home, & f/u in ortho office.Has rw, will need 3n1-[facility] dme rep aware to deliver to rm prior d/c. [facility] already following PTA if d/c plans home w/[facility].                    Action/Plan:d/c home w/dme   Expected Discharge Date:                  Expected Discharge Plan:  OP Rehab  In-House Referral:     Discharge planning Services  CM Consult  Post Acute Care Choice:  Durable Medical Equipment(has rw) Choice offered to:     DME Arranged:    DME Agency:     HH Arranged:    HH Agency:     Status of Service:  In process, will continue to follow  If discussed at Long Length of Stay Meetings, dates discussed:    Additional Comments:  Dessa Phi, RN 05/05/2018, 1:12 PM

## 2018-05-05 NOTE — Progress Notes (Signed)
Subjective: 1 Day Post-Op Procedure(s) (LRB): LEFT TOTAL HIP ARTHROPLASTY (Left) Patient reports pain as 2 on 0-10 scaleDoing very well. He is up in a chair and has ambulated. .    Objective: Vital signs in last 24 hours: Temp:  [98 F (36.7 C)-98.5 F (36.9 C)] 98.2 F (36.8 C) (07/25 1028) Pulse Rate:  [74-104] 96 (07/25 1028) Resp:  [14-17] 15 (07/25 1028) BP: (112-137)/(77-93) 112/78 (07/25 1028) SpO2:  [94 %-99 %] 96 % (07/25 1028)  Intake/Output from previous day: 07/24 0701 - 07/25 0700 In: 4408.3 [P.O.:500; I.V.:3708.3; IV Piggyback:200] Out: 2500 [Urine:2200; Blood:300] Intake/Output this shift: Total I/O In: 240 [P.O.:240] Out: 100 [Urine:100]  Recent Labs    05/05/18 0529  HGB 12.5*   Recent Labs    05/05/18 0529  WBC 20.9*  RBC 3.80*  HCT 38.3*  PLT 208   Recent Labs    05/05/18 0529  NA 139  K 3.9  CL 105  CO2 28  BUN 14  CREATININE 0.91  GLUCOSE 157*  CALCIUM 8.6*   Recent Labs    05/04/18 0600 05/05/18 0529  INR 1.08 1.12    Dorsiflexion/Plantar flexion intact Compartment soft  Anticipated LOS equal to or greater than 2 midnights due to - Age 46 and older with one or more of the following:  - Obesity  - Expected need for hospital services (PT, OT, Nursing) required for safe  discharge  - Anticipated need for postoperative skilled nursing care or inpatient rehab  - Active co-morbidities: None OR   - Unanticipated findings during/Post Surgery: None  - Patient is a high risk of re-admission due to: Non-elective hospital admission within previous 6 months   Assessment/Plan: 1 Day Post-Op Procedure(s) (LRB): LEFT TOTAL HIP ARTHROPLASTY (Left) Up with therapy    [name] 05/05/2018, 1:06 PM

## 2018-05-05 NOTE — Evaluation (Signed)
Occupational Therapy Evaluation Patient Details Name: [name] MRN: [id number] DOB: 05-17-1932 Today's Date: 05/05/2018    History of Present Illness L THA secondary to hip OA. PMH including R THA, R TKA, cardiac bypass surgery in 2005.    Clinical Impression   This 84 year old man was admitted for the above.  He has a h/o THA in '08. Will benefit from continued OT in acute setting to reinforce precautions during adls/bathroom transfers    Follow Up Recommendations  Supervision/Assistance - 24 hour    Equipment Recommendations  3 in 1 bedside commode    Recommendations for Other Services       Precautions / Restrictions Precautions Precautions: Posterior Hip Restrictions Weight Bearing Restrictions: Yes LLE Weight Bearing: Partial weight bearing LLE Partial Weight Bearing Percentage or Pounds: 50%      Mobility Bed Mobility         Supine to sit: Min assist     General bed mobility comments: assist for LLE  Transfers   Equipment used: Rolling walker (2 wheeled)   Sit to Stand: Min guard         General transfer comment: cues for hand placement; safety    Balance                                           ADL either performed or assessed with clinical judgement   ADL Overall ADL's : Needs assistance/impaired             Lower Body Bathing: Moderate assistance;Sit to/from stand       Lower Body Dressing: Maximal assistance;Sit to/from stand   Toilet Transfer: Min guard;Ambulation;BSC;RW   Toileting- Water quality scientist and Hygiene: Min guard;Sit to/from stand   Tub/ Shower Transfer: Walk-in shower;Min guard;Ambulation     General ADL Comments: pt states wife will assist with adls. Only recalled 1/3 thps. Able to maintain PWB.  supervision for UB adls due to precautions     Vision         Perception     Praxis      Pertinent Vitals/Pain Pain Score: 2  Pain Location: L hip  Pain Descriptors / Indicators:  Sore Pain Intervention(s): Limited activity within patient's tolerance;Monitored during session;Premedicated before session;Repositioned     Hand Dominance     Extremity/Trunk Assessment Upper Extremity Assessment Upper Extremity Assessment: Overall WFL for tasks assessed           Communication Communication Communication: No difficulties   Cognition Arousal/Alertness: Awake/alert Behavior During Therapy: WFL for tasks assessed/performed Overall Cognitive Status: Within Functional Limits for tasks assessed                                     General Comments       Exercises     Shoulder Instructions      Home Living Family/patient expects to be discharged to:: Private residence Living Arrangements: Spouse/significant other Available Help at Discharge: Home health;Family               Bathroom Shower/Tub: Occupational psychologist: Handicapped height     Home Equipment: Shower seat          Prior Functioning/Environment Level of Independence: Independent with assistive device(s)  Gait / Transfers Assistance Needed: uses cane occasionally  OT Problem List: Decreased knowledge of precautions;Decreased knowledge of use of DME or AE;Decreased safety awareness;Pain      OT Treatment/Interventions: Self-care/ADL training;DME and/or AE instruction;Patient/family education    OT Goals(Current goals can be found in the care plan section) Acute Rehab OT Goals Patient Stated Goal: increased mobility  OT Goal Formulation: With patient Time For Goal Achievement: 05/19/18 Potential to Achieve Goals: Good ADL Goals Pt Will Transfer to Toilet: with supervision;ambulating;bedside commode(with one cue) Pt Will Perform Tub/Shower Transfer: Shower transfer(pt/wife will verbalize sequence vs demonstrating with S) Additional ADL Goal #1: Pt will recall 3/3 THPs without cues  OT Frequency: Min 2X/week   Barriers to D/C:             Co-evaluation              AM-PAC PT "6 Clicks" Daily Activity     Outcome Measure Help from another person eating meals?: None Help from another person taking care of personal grooming?: A Little Help from another person toileting, which includes using toliet, bedpan, or urinal?: A Little Help from another person bathing (including washing, rinsing, drying)?: A Lot Help from another person to put on and taking off regular upper body clothing?: A Little Help from another person to put on and taking off regular lower body clothing?: A Lot 6 Click Score: 17   End of Session    Activity Tolerance: Patient tolerated treatment well Patient left: in chair;with call bell/phone within reach;with bed alarm set  OT Visit Diagnosis: Pain Pain - Right/Left: Left Pain - part of body: Hip                Time: 4037-5436 OT Time Calculation (min): 28 min Charges:  OT General Charges $OT Visit: 1 Visit OT Evaluation $OT Eval Low Complexity: 1 Low OT Treatments $Self Care/Home Management : 8-22 mins G-Codes:     [name], OTR/L [phone] 05/05/2018  [name] 05/05/2018, 9:57 AM

## 2018-05-06 LAB — BASIC METABOLIC PANEL
Anion gap: 5 (ref 5–15)
BUN: 15 mg/dL (ref 8–23)
CALCIUM: 8.6 mg/dL — AB (ref 8.9–10.3)
CHLORIDE: 107 mmol/L (ref 98–111)
CO2: 28 mmol/L (ref 22–32)
Creatinine, Ser: 0.96 mg/dL (ref 0.61–1.24)
GFR calc Af Amer: >60 mL/min (ref >60)
GFR calc non Af Amer: >60 mL/min (ref >60)
GLUCOSE: 114 mg/dL — AB (ref 70–99)
Potassium: 3.9 mmol/L (ref 3.5–5.1)
Sodium: 140 mmol/L (ref 135–145)

## 2018-05-06 LAB — CBC
HCT: 34.2 % — ABNORMAL LOW (ref 39.0–52.0)
HEMOGLOBIN: 11.2 g/dL — AB (ref 13.0–17.0)
MCH: 33.2 pg (ref 26.0–34.0)
MCHC: 32.7 g/dL (ref 30.0–36.0)
MCV: 101.5 fL — ABNORMAL HIGH (ref 78.0–100.0)
Platelets: 175 10*3/uL (ref 150–400)
RBC: 3.37 MIL/uL — ABNORMAL LOW (ref 4.22–5.81)
RDW: 13.4 % (ref 11.5–15.5)
WBC: 16.6 10*3/uL — ABNORMAL HIGH (ref 4.0–10.5)

## 2018-05-06 LAB — PROTIME-INR
INR: 1.25
Prothrombin Time: 15.6 seconds — ABNORMAL HIGH (ref 11.4–15.2)

## 2018-05-06 MED ORDER — MAGNESIUM CITRATE PO SOLN
1.0000 | Freq: Every day | ORAL | Status: DC | PRN
  Filled 2018-05-06: qty 296

## 2018-05-06 MED ORDER — WARFARIN SODIUM 5 MG PO TABS
7.5000 mg | ORAL_TABLET | Freq: Once | ORAL | Status: AC
  Administered 2018-05-06: 7.5 mg via ORAL
  Filled 2018-05-06: qty 1

## 2018-05-06 MED ORDER — NALOXONE HCL 0.4 MG/ML IJ SOLN
INTRAMUSCULAR | Status: AC
  Administered 2018-05-07
  Filled 2018-05-06: qty 1

## 2018-05-06 MED ORDER — DOCUSATE SODIUM 100 MG PO CAPS: 100.0000 mg | ORAL_CAPSULE | Freq: Two times a day (BID) | ORAL | 0 refills | Status: DC

## 2018-05-06 MED ORDER — METHOCARBAMOL 500 MG PO TABS: 500.0000 mg | ORAL_TABLET | Freq: Three times a day (TID) | ORAL | 1 refills | Status: DC | PRN

## 2018-05-06 MED ORDER — ENOXAPARIN SODIUM 100 MG/ML ~~LOC~~ SOLN: 85.0000 mg | Freq: Two times a day (BID) | SUBCUTANEOUS | 0 refills | Status: DC

## 2018-05-06 MED ORDER — OXYCODONE HCL 5 MG PO TABS: 5.0000 mg | ORAL_TABLET | Freq: Four times a day (QID) | ORAL | 0 refills | Status: DC | PRN

## 2018-05-06 MED ORDER — BISACODYL 10 MG RE SUPP
10.0000 mg | Freq: Every day | RECTAL | Status: DC | PRN
  Administered 2018-05-06 – 2018-05-12 (×2): 10 mg via RECTAL
  Filled 2018-05-06 (×2): qty 1

## 2018-05-06 NOTE — Progress Notes (Signed)
PT Cancellation Note  Patient Details Name: [name] MRN: [id number] DOB: 11/11/1931   Cancelled Treatment:     PT attempted x 2 this pm but deferred 2* pt continued elevated pain level.  Will follow in am.   Loxley Schmale 05/06/2018, 4:57 PM

## 2018-05-06 NOTE — Plan of Care (Signed)
  Problem: Clinical Measurements: Goal: Ability to maintain clinical measurements within normal limits will improve Outcome: Progressing   Problem: Clinical Measurements: Goal: Postoperative complications will be avoided or minimized Outcome: Progressing   Problem: Skin Integrity: Goal: Will show signs of wound healing Outcome: Progressing

## 2018-05-06 NOTE — Care Management Note (Signed)
Case Management Note  Patient Details  Name: [name] MRN: [id number] Date of Birth: 25-Nov-1931  Subjective/Objective: Ordered for HHPT-[facility] rep Ronalee Belts aware of d/c & HHC orders. [facility] has already delivered 3n1 to rm prior to d/c. Patient already has a rw. No further CM needs.                   Action/Plan:d/ chome w/HHC/dme.   Expected Discharge Date:  05/06/18               Expected Discharge Plan:  [place]  In-House Referral:     Discharge planning Services  CM Consult  Post Acute Care Choice:  Durable Medical Equipment(has rw) Choice offered to:  Patient  DME Arranged:  3-N-1 DME Agency:  [place]:  PT [place] Agency:  [facility] (formerly [facility])  Status of Service:  Completed, signed off  If discussed at [employer] of Stay Meetings, dates discussed:    Additional Comments:  Dessa Phi, RN 05/06/2018, 11:52 AM

## 2018-05-06 NOTE — Progress Notes (Signed)
OT Cancellation Note  Patient Details Name: GABREIL YONKERS MRN: [id number] DOB: [birth year]   Cancelled Treatment:    Reason Eval/Treat Not Completed: Other (comment)Pts pain level 7/10 in hip and declines OOB.  Pt does not feel ready to go home this day. Will check on pt next day.  Kari Baars, [place]  Payton Mccallum D 05/06/2018, 3:25 PM

## 2018-05-06 NOTE — Progress Notes (Signed)
OT Cancellation Note  Patient Details Name: BELLAMY JUDSON MRN: [id number] DOB: 28-Nov-1931   Cancelled Treatment:    Reason Eval/Treat Not Completed: Patient not medically ready  Noted pt had episode in bathroom - will check on later if appropriate.  Kari Baars, [place]  Payton Mccallum D 05/06/2018, 11:43 AM

## 2018-05-06 NOTE — Progress Notes (Signed)
Physical Therapy Treatment Patient Details Name: [name] MRN: [id number] DOB: [birth year] Today's Date: 05/06/2018    History of Present Illness L THA secondary to hip OA. PMH including R THA, R TKA, cardiac bypass surgery in 2005.     PT Comments    Pt with increased L hip pain limiting his ability to perform all mobility today. Pt with difficulty with stair climbing, especially with sequencing and following verbal cuing from PT. Pt needs continued practice with stair climbing and bed mobility, discussed this with OT. Will follow up later this morning to prepare for D/C.    Follow Up Recommendations  Follow surgeon's recommendation for DC plan and follow-up therapies     Equipment Recommendations  3in1 (PT)    Recommendations for Other Services OT consult     Precautions / Restrictions Precautions Precautions: Posterior Hip Restrictions Weight Bearing Restrictions: No LLE Weight Bearing: Weight bearing as tolerated LLE Partial Weight Bearing Percentage or Pounds: As per Dr. Gladstone Lighter 05/05/18    Mobility  Bed Mobility Overal bed mobility: Needs Assistance Bed Mobility: Supine to Sit     Supine to sit: Min assist     General bed mobility comments: assist for LLE management and maintenance of hip precautions, increased time for bed mobility   Transfers Overall transfer level: Needs assistance Equipment used: Rolling walker (2 wheeled) Transfers: Sit to/from Stand Sit to Stand: Min guard         General transfer comment: cues for hand placement; safety  Ambulation/Gait Ambulation/Gait assistance: Min assist Gait Distance (Feet): 30 Feet Assistive device: Rolling walker (2 wheeled) Gait Pattern/deviations: Step-to pattern;Decreased step length - left;Decreased stance time - right;Decreased stance time - left;Decreased step length - right;Trunk flexed Gait velocity: decreased    General Gait Details: verbal cues for toeing out on L, min assist for steadying  at start of ambulation and sequencing    Stairs Stairs: Yes Stairs assistance: Min assist Stair Management: One rail Right;With cane Number of Stairs: 2 General stair comments: max verbal cuing for foot placement on stairs and sequencing, min assist for steadying    Wheelchair Mobility    Modified Rankin (Stroke Patients Only)       Balance Overall balance assessment: Mild deficits observed, not formally tested     Sitting balance - Comments: sitting balance not formally assessed, but sat EOB without UE or external support    Standing balance support: Bilateral upper extremity supported                                Cognition Arousal/Alertness: Awake/alert Behavior During Therapy: WFL for tasks assessed/performed Overall Cognitive Status: Within Functional Limits for tasks assessed                                        Exercises Total Joint Exercises Ankle Circles/Pumps: AROM;Both;20 reps;Supine(chair leaned back maximally for all exercises) Quad Sets: AROM;Left;15 reps;Supine Heel Slides: AROM;AAROM;Left;Supine;15 reps Hip ABduction/ADduction: 10 reps;AROM;Supine    General Comments        Pertinent Vitals/Pain Pain Assessment: 0-10 Pain Score: 5 (more painful for pt today versus yesterday ) Pain Descriptors / Indicators: Burning;Aching;Constant Pain Intervention(s): Limited activity within patient's tolerance;Ice applied;Monitored during session    Home Living  Prior Function            PT Goals (current goals can now be found in the care plan section) Acute Rehab PT Goals Patient Stated Goal: progress mobility PT Goal Formulation: With patient/family Time For Goal Achievement: 05/12/18 Potential to Achieve Goals: Good Progress towards PT goals: Progressing toward goals    Frequency    7X/week      PT Plan Current plan remains appropriate    Co-evaluation               AM-PAC PT "6 Clicks" Daily Activity  Outcome Measure  Difficulty turning over in bed (including adjusting bedclothes, sheets and blankets)?: Unable Difficulty moving from lying on back to sitting on the side of the bed? : Unable Difficulty sitting down on and standing up from a chair with arms (e.g., wheelchair, bedside commode, etc,.)?: Unable Help needed moving to and from a bed to chair (including a wheelchair)?: A Little Help needed walking in hospital room?: A Little Help needed climbing 3-5 steps with a railing? : A Lot 6 Click Score: 11    End of Session Equipment Utilized During Treatment: Gait belt Activity Tolerance: Patient limited by fatigue;Patient limited by pain Patient left: with chair alarm set;in chair;with family/visitor present;with call bell/phone within reach Nurse Communication: Mobility status;Patient requests pain meds PT Visit Diagnosis: Difficulty in walking, not elsewhere classified (R26.2)     Time: 9923-4144 PT Time Calculation (min) (ACUTE ONLY): 28 min  Charges:  $Therapeutic Exercise: 8-22 mins $Therapeutic Activity: 8-22 mins                     Avrey Hyser D Doretha Goding PT DPT   Rosia Syme D Tawn Fitzner 05/06/2018, 11:06 AM

## 2018-05-06 NOTE — Care Management Important Message (Signed)
Important Message  Patient Details  Name: [name] MRN: [id number] Date of Birth: 1932-03-29   Medicare Important Message Given:  Yes    Kerin Salen 05/06/2018, 10:54 AMImportant Message  Patient Details  Name: [name] MRN: [id number] Date of Birth: 02-01-1932   Medicare Important Message Given:  Yes    Kerin Salen 05/06/2018, 10:54 AM

## 2018-05-06 NOTE — Progress Notes (Signed)
Subjective: 2 Days Post-Op Procedure(s) (LRB): LEFT TOTAL HIP ARTHROPLASTY (Left) Patient reports pain as 5 on 0-10 scale.  He is constipated and has increased pain in his Hip and knee secondary to overuse today. Will Dc tomorrow. He also has Orthostatic hypotension that has been controlled with IV fluids.  Objective: Vital signs in last 24 hours: Temp:  [98.1 F (36.7 C)-98.6 F (37 C)] 98.1 F (36.7 C) (07/26 1341) Pulse Rate:  [84-102] 102 (07/26 1443) Resp:  [15-17] 17 (07/26 1341) BP: (70-157)/(41-105) 105/68 (07/26 1443) SpO2:  [96 %-98 %] 97 % (07/26 1443)  Intake/Output from previous day: 07/25 0701 - 07/26 0700 In: 949.7 [P.O.:740; I.V.:209.7] Out: 1225 [Urine:1225] Intake/Output this shift: Total I/O In: 240 [P.O.:240] Out: -   Recent Labs    05/05/18 0529 05/06/18 0508  HGB 12.5* 11.2*   Recent Labs    05/05/18 0529 05/06/18 0508  WBC 20.9* 16.6*  RBC 3.80* 3.37*  HCT 38.3* 34.2*  PLT 208 175   Recent Labs    05/05/18 0529 05/06/18 0508  NA 139 140  K 3.9 3.9  CL 105 107  CO2 28 28  BUN 14 15  CREATININE 0.91 0.96  GLUCOSE 157* 114*  CALCIUM 8.6* 8.6*   Recent Labs    05/05/18 0529 05/06/18 0508  INR 1.12 1.25    Dorsiflexion/Plantar flexion intact Compartment soft  Anticipated LOS equal to or greater than 2 midnights due to - Age 82 and older with one or more of the following:  - Obesity  - Expected need for hospital services (PT, OT, Nursing) required for safe  discharge  - Anticipated need for postoperative skilled nursing care or inpatient rehab  - Active co-morbidities: Chronic pain requiring opiods OR   - Unanticipated findings during/Post Surgery: Orthostatic Hypotension.  - Patient is a high risk of re-admission due to: Non-elective hospital admission within previous 6 months   Assessment/Plan: 2 Days Post-Op Procedure(s) (LRB): LEFT TOTAL HIP ARTHROPLASTY (Left) Up with therapy    [name] 05/06/2018, 4:49  PM

## 2018-05-06 NOTE — Progress Notes (Signed)
[place] for Warfarin, Lovenox Indication: VTE prophylaxis  Allergies  Allergen Reactions  . Penicillins Hives and Rash    Has patient had a PCN reaction causing immediate rash, facial/tongue/throat swelling, SOB or lightheadedness with hypotension: Yes Has patient had a PCN reaction causing severe rash involving mucus membranes or skin necrosis: Yes Has patient had a PCN reaction that required hospitalization: No Has patient had a PCN reaction occurring within the last 10 years: No If all of the above answers are "NO", then may proceed with Cephalosporin use.   . Hct [Hydrochlorothiazide] Other (See Comments)    hyper  . Statins Other (See Comments)    Myalgia, tolerates low dosages of lovastatin     Patient Measurements: Height: 5\' 7"  (170.2 cm) Weight: 190 lb (86.2 kg) IBW/kg (Calculated) : 66.1  Vital Signs: Temp: 98.6 F (37 C) (07/26 0550) Temp Source: Oral (07/26 0550) BP: 121/80 (07/26 0733) Pulse Rate: 94 (07/26 0733)  Labs: Recent Labs    05/04/18 0600 05/05/18 0529 05/06/18 0508  HGB  --  12.5* 11.2*  HCT  --  38.3* 34.2*  PLT  --  208 175  APTT 26  --   --   LABPROT 13.9 14.4 15.6*  INR 1.08 1.12 1.25  CREATININE  --  0.91 0.96    Estimated Creatinine Clearance: 61.1 mL/min (by C-G formula based on SCr of 0.96 mg/dL).   Assessment: 10 yoM admitted on 7/24 for Left THA.  PMH is significant for afib on warfarin.  Pharmacy is consulted to begin warfarin and Lovenox bridge on 7/25.  Home warfarin 5 mg daily except 2.5 mg on Monday, Wednesday, Friday.  Last dose taken on 7/18.  Today, 05/06/2018:  INR 1.25 CBC: Hgb 11.2, Plt 175 SCr 0.96, Crcl ~ 61 ml/min No major drug-drug interactions noted  Goal of Therapy:  INR 2-3 Monitor platelets by anticoagulation protocol: Yes   Plan:   Lovenox 1 mg/kg (85 mg) SQ q12h  Warfarin 7.5 mg PO x 1 dose today, then return to previous home dosing (5 mg daily except 2.5  mg on Monday, Wednesday, Friday)  Daily PT/INR.  Monitor for signs and symptoms of bleeding.  After discharge, recheck INR on Monday 7/29  Gretta Arab PharmD, BCPS Pager [phone] 05/06/2018,8:54 AM

## 2018-05-06 NOTE — Progress Notes (Signed)
   Subjective: 2 Days Post-Op Procedure(s) (LRB): LEFT TOTAL HIP ARTHROPLASTY (Left) Patient reports pain as mild.   Patient seen in rounds with Dr. Gladstone Lighter. Patient is well, but has had some minor complaints of constipation and pain in the left hip, requiring pain medications. Positive flatus. No abdominal discomfort. No SOB or chest pain. Voiding well. Has some increased discomfort in the left leg today due to walking so much with therapy yesterday.  Plan is to go Home after hospital stay.  Objective: Vital signs in last 24 hours: Temp:  [98.2 F (36.8 C)-98.6 F (37 C)] 98.6 F (37 C) (07/26 0550) Pulse Rate:  [70-104] 84 (07/26 0550) Resp:  [14-17] 15 (07/26 0550) BP: (112-122)/(71-97) 119/97 (07/26 0550) SpO2:  [96 %-98 %] 96 % (07/26 0550)  Intake/Output from previous day:  Intake/Output Summary (Last 24 hours) at 05/06/2018 0707 Last data filed at 05/06/2018 0600 Gross per 24 hour  Intake 949.67 ml  Output 1225 ml  Net -275.33 ml     Labs: Recent Labs    05/05/18 0529 05/06/18 0508  HGB 12.5* 11.2*   Recent Labs    05/05/18 0529 05/06/18 0508  WBC 20.9* 16.6*  RBC 3.80* 3.37*  HCT 38.3* 34.2*  PLT 208 175   Recent Labs    05/05/18 0529 05/06/18 0508  NA 139 140  K 3.9 3.9  CL 105 107  CO2 28 28  BUN 14 15  CREATININE 0.91 0.96  GLUCOSE 157* 114*  CALCIUM 8.6* 8.6*   Recent Labs    05/05/18 0529 05/06/18 0508  INR 1.12 1.25    EXAM General - Patient is Alert and Oriented Extremity - Neurologically intact Intact pulses distally Dorsiflexion/Plantar flexion intact Compartment soft Dressing/Incision - clean, dry, no drainage Motor Function - intact, moving foot and toes well on exam.   Past Medical History:  Diagnosis Date  . Anemia   . Atrial fibrillation ([place])   . Cataract   . Cellulitis    In past  . Complication of anesthesia    HARD TIME WAKING; CAUSES MY BP TO GO UP   . Coronary artery disease    5 bypasses  . DJD  (degenerative joint disease)   . Ejection fraction < 50%    Mildly reduced, 40% by echo  . GERD (gastroesophageal reflux disease)   . Hyperlipidemia   . Hypertension   . Persistent atrial fibrillation ([place])   . Prostate hypertrophy    on CT scan 09/2014  . PUD (peptic ulcer disease)    RESOLVED   . Skin cancer of eyelid    Resected    Assessment/Plan: 2 Days Post-Op Procedure(s) (LRB): LEFT TOTAL HIP ARTHROPLASTY (Left) Active Problems:   H/O total hip arthroplasty, left  Estimated body mass index is 29.76 kg/m as calculated from the following:   Height as of this encounter: 5\' 7"  (1.702 m).   Weight as of this encounter: 86.2 kg (190 lb). Advance diet Up with therapy Discharge home with home health  DVT Prophylaxis - Lovenox and Coumadin Partial weightbearing 50%  Continue working with therapy this morning. No concern at this time for ileus. Will move through PRN meds for constipation as needed. Encouraged fluids. Plan for DC home with HHPT today if he is meeting his goals. Will consult with pharmacy about Coumadin and Lovenox dosing once home.   Ardeen Jourdain, PA-C Orthopaedic Surgery 05/06/2018, 7:07 AM

## 2018-05-07 ENCOUNTER — Inpatient Hospital Stay (HOSPITAL_COMMUNITY): Payer: Medicare Other

## 2018-05-07 DIAGNOSIS — I4891 Unspecified atrial fibrillation: Secondary | ICD-10-CM | POA: Diagnosis present

## 2018-05-07 DIAGNOSIS — I959 Hypotension, unspecified: Secondary | ICD-10-CM

## 2018-05-07 LAB — URINALYSIS, ROUTINE W REFLEX MICROSCOPIC
Glucose, UA: NEGATIVE mg/dL
Hgb urine dipstick: NEGATIVE
Ketones, ur: NEGATIVE mg/dL
LEUKOCYTES UA: NEGATIVE
Nitrite: NEGATIVE
PH: 5 (ref 5.0–8.0)
Protein, ur: NEGATIVE mg/dL
Specific Gravity, Urine: 1.024 (ref 1.005–1.030)

## 2018-05-07 LAB — TROPONIN I: Troponin I: <0.03 ng/mL (ref <0.03)

## 2018-05-07 LAB — BASIC METABOLIC PANEL
Anion gap: 6 (ref 5–15)
BUN: 23 mg/dL (ref 8–23)
CO2: 26 mmol/L (ref 22–32)
CREATININE: 1.42 mg/dL — AB (ref 0.61–1.24)
Calcium: 7.6 mg/dL — ABNORMAL LOW (ref 8.9–10.3)
Chloride: 104 mmol/L (ref 98–111)
GFR calc Af Amer: 51 mL/min — ABNORMAL LOW (ref >60)
GFR calc non Af Amer: 44 mL/min — ABNORMAL LOW (ref >60)
Glucose, Bld: 119 mg/dL — ABNORMAL HIGH (ref 70–99)
Potassium: 4.3 mmol/L (ref 3.5–5.1)
Sodium: 136 mmol/L (ref 135–145)

## 2018-05-07 LAB — MAGNESIUM: MAGNESIUM: 1.8 mg/dL (ref 1.7–2.4)

## 2018-05-07 LAB — PROTIME-INR
INR: 1.88
Prothrombin Time: 21.5 seconds — ABNORMAL HIGH (ref 11.4–15.2)

## 2018-05-07 LAB — PREPARE RBC (CROSSMATCH)

## 2018-05-07 LAB — LACTIC ACID, PLASMA
Lactic Acid, Venous: 1.4 mmol/L (ref 0.5–1.9)
Lactic Acid, Venous: 1.2 mmol/L (ref 0.5–1.9)

## 2018-05-07 LAB — HEMOGLOBIN AND HEMATOCRIT, BLOOD
HCT: 26.8 % — ABNORMAL LOW (ref 39.0–52.0)
HEMATOCRIT: 23.2 % — AB (ref 39.0–52.0)
Hemoglobin: 7.7 g/dL — ABNORMAL LOW (ref 13.0–17.0)
Hemoglobin: 9.1 g/dL — ABNORMAL LOW (ref 13.0–17.0)

## 2018-05-07 LAB — PROCALCITONIN: PROCALCITONIN: 0.27 ng/mL

## 2018-05-07 LAB — OCCULT BLOOD X 1 CARD TO LAB, STOOL: Fecal Occult Bld: NEGATIVE

## 2018-05-07 MED ORDER — NALOXONE HCL 0.4 MG/ML IJ SOLN: 0.2000 mg | INTRAMUSCULAR | Status: DC | PRN

## 2018-05-07 MED ORDER — IOPAMIDOL (ISOVUE-300) INJECTION 61%
INTRAVENOUS | Status: AC
  Administered 2018-05-07: 80 mL
  Filled 2018-05-07: qty 100

## 2018-05-07 MED ORDER — SODIUM CHLORIDE 0.9 % IV BOLUS
500.0000 mL | Freq: Once | INTRAVENOUS | Status: AC
  Administered 2018-05-07: 500 mL via INTRAVENOUS

## 2018-05-07 MED ORDER — SODIUM CHLORIDE 0.9% IV SOLUTION
Freq: Once | INTRAVENOUS | Status: AC
  Administered 2018-05-07: 11:00:00 via INTRAVENOUS

## 2018-05-07 MED ORDER — SODIUM CHLORIDE 0.9 % IV BOLUS
500.0000 mL | Freq: Once | INTRAVENOUS | Status: AC | PRN
  Administered 2018-05-06: 500 mL via INTRAVENOUS

## 2018-05-07 NOTE — Progress Notes (Signed)
Called Rapid Response @ 8502 because patient HR was irregular and pt was tachy in the 120s-140s. BP was 114/74.   See Rapid Response note Julio Alm, RN

## 2018-05-07 NOTE — Progress Notes (Addendum)
Appears patients hemoglobin has dropped from 11.2 on 7/26 all the way to 8.3 this morning (48h)!  Note: surgery was the 24th, so very delayed HGB drop!   1. Check hemoccult with next stool (but RN staff hasnt noted any melena nor hematochezia at this point, did state he had loose stool earlier this morning prior to transfer to SDU). 2. Repeat H/H at 1000 3. Type and screen 4. Holding lovenox and coumadin for the moment, until we can confirm hemoglobin stability. 5. Ordering SCDs in the mean time 6. INR 1.8, so not really indicated to reverse at this point. 7. Intake and output 8. Remainder of vitals currently per Merry Proud RN: HR 100 A.Fib, SBP 116, afebrile.

## 2018-05-07 NOTE — Progress Notes (Signed)
Called Report to [place], RN  Julio Alm RN

## 2018-05-07 NOTE — Significant Event (Addendum)
Rapid Response Event Note  Overview: Time Called: 2315 Arrival Time: 2320 Event Type: Other (Comment)(A-fib)  Initial Focused Assessment:  Called reference ? A-fib.  ATF patient resting in bed, no obvious signs of distress, connected to RRT monitor, EKG rate 121 and irregular, 12-lead EKG done shows A-fib, pt does have Hx of A-fib.  However, pt drowsy, falling back asleep mid-sentence, primary RN advised that she had given Dilaudid 0.5mg  but that had been a few hours ago,  BP 81/60.  Pt is able to respond appropriately to questions.  Lungs clear bilat., 500 NS bolus started for low BP.  Stayed in room and intermittently interacted with patient.  After, approx 15 minutes, realized pt is more drowsy than a normal person at this time of night.  Decided to give some narcan, after giving 0.2mg  IV slowly, pt became more awake and interactive.  BP came up to 114/73, heart rate 98 to 110.  I will continue to monitor for at least 20 minutes to make sure pt ok when narcan wears off.   Interventions:  12 lead EKG, VS, Narcan  Plan of Care (if not transferred):  Continue to monitor, with neuro checks at least q 2 x 2, bedside pulse ox.  Event Summary:  Outcome: Stayed in room and stabalized  Event End Time: 0020   Due to patient's BP being soft and requiring another 580ml bolus, Justin with [facility] notified, pt transferred to [facility] unit, and [facility] was consulted by [place].   Felecia Jan ICU/SD Care Coordinator / Rapid Response Nurse Rapid Response Number:  [phone]

## 2018-05-07 NOTE — Progress Notes (Signed)
PT Cancellation Note  Patient Details Name: [name] MRN: [id number] DOB: October 18, 1931   Cancelled Treatment:     PT deferred this date.  Pt with decreased hgb and BP.  IV team in room to gain access for transfusion.  Will follow.   Eliese Kerwood 05/07/2018, 2:40 PM

## 2018-05-07 NOTE — Consult Note (Addendum)
Medical Consultation   [name]  JSE:[id number]  DOB: 1932-06-03  DOA: 05/04/2018  PCP: Chipper Herb, MD    Requesting physician: Mechele Claude PA-C  Reason for consultation: A.Fib RVR and Hypotension   History of Present Illness: [name] is an 84 y.o. male with h/o persistent A.Fib normally rate controlled on coreg and Cardizem.  Patient underwent THA a couple of days ago.  Noted to have orthostatic hypotension during surgery according to Dr. Charlestine Night progress note today.  Had been doing well post op, and actually had been scheduled to go home today, but didn't end up going home.  Has been on lovenox bridge to coumadin post-op.  HGB yesterday 7/25 was 11.2 down from 12.5 pre-op.  This evening, he developed hypotension with SBP in the 80s, A.Fib went into RVR with rate as high as 130s-140s.  Evening Coreg dose has been held.  He was given 2 rounds of 500cc boluses with very good effect.  Hr now down to ~100, still in A.Fib.  SBP now up over 100.  Was also given 0.2mg  narcan, unclear if this was needed or not.  T 99.8.  Patient denies any chest pain, SOB, abd pain.  Says just his hip hurts.   Review of Systems:  ROS As per HPI otherwise 10 point review of systems negative.     Past Medical History: Past Medical History:  Diagnosis Date  . Anemia   . Atrial fibrillation ([place])   . Cataract   . Cellulitis    In past  . Complication of anesthesia    HARD TIME WAKING; CAUSES MY BP TO GO UP   . Coronary artery disease    5 bypasses  . DJD (degenerative joint disease)   . Ejection fraction < 50%    Mildly reduced, 40% by echo  . GERD (gastroesophageal reflux disease)   . Hyperlipidemia   . Hypertension   . Persistent atrial fibrillation ([place])   . Prostate hypertrophy    on CT scan 09/2014  . PUD (peptic ulcer disease)    RESOLVED   . Skin cancer of eyelid    Resected    Past Surgical History: Past Surgical History:  Procedure  Laterality Date  . APPENDECTOMY    . BYPASS GRAFT  2005  . CATARACT EXTRACTION W/PHACO Left 07/22/2015   Procedure: CATARACT EXTRACTION PHACO AND INTRAOCULAR LENS PLACEMENT LEFT EYE CDE=8.14;  Surgeon: Tonny Branch, MD;  Location: [facility];  Service: Ophthalmology;  Laterality: Left;  . CORONARY ARTERY BYPASS GRAFT  4/05   LIMA to LAD, SVG to PDA, SVG to ramus intermediate  . EYE SURGERY  07/2015  . EYELID CARCINOMA EXCISION     Skin cancer resected  . Heart bypass  2005  . TOTAL HIP ARTHROPLASTY     Right  . TOTAL HIP ARTHROPLASTY Left 05/04/2018   Procedure: LEFT TOTAL HIP ARTHROPLASTY;  Surgeon: [name], MD;  Location: [facility];  Service: Orthopedics;  Laterality: Left;  . TOTAL KNEE ARTHROPLASTY     Right     Allergies:   Allergies  Allergen Reactions  . Penicillins Hives and Rash    Has patient had a PCN reaction causing immediate rash, facial/tongue/throat swelling, SOB or lightheadedness with hypotension: Yes Has patient had a PCN reaction causing severe rash involving mucus membranes or skin necrosis: Yes Has patient had a PCN reaction that required hospitalization: No Has  patient had a PCN reaction occurring within the last 10 years: No If all of the above answers are "NO", then may proceed with Cephalosporin use.   . Hct [Hydrochlorothiazide] Other (See Comments)    hyper  . Statins Other (See Comments)    Myalgia, tolerates low dosages of lovastatin      Social History:  reports that he quit smoking about 54 years ago. His smoking use included cigarettes. He has a 10.00 pack-year smoking history. He has never used smokeless tobacco. He reports that he does not drink alcohol or use drugs.   Family History: Family History  Problem Relation Age of Onset  . Stroke Mother   . Stroke Father   . Hypertension Father   . Early death Brother        63 months old  . Cancer Brother        lung  . Stroke Brother        heat    Unacceptable: Noncontributory,  unremarkable, or negative. Acceptable: Family history reviewed and not pertinent (If you reviewed it)   Physical Exam: Vitals:   05/06/18 1443 05/06/18 1738 05/06/18 1740 05/06/18 2310  BP: 105/68 119/68 101/72 114/74  Pulse: (!) 102 (!) 110 (!) 117 (!) 131  Resp:    15  Temp:    99.8 F (37.7 C)  TempSrc:    Oral  SpO2: 97%   96%  Weight:      Height:        Constitutional: Appearance,  Alert and awake, oriented x3, not in any acute distress. Eyes: PERLA, EOMI, irises appear normal, anicteric sclera,  ENMT: external ears and nose appear normal, normal hearing or hard of hearing            Lips appears normal, oropharynx mucosa, tongue, posterior pharynx appear normal  Neck: neck appears normal, no masses, normal ROM, no thyromegaly, no JVD  CVS: IRR, IRR Respiratory:  clear to auscultation bilaterally, no wheezing, rales or rhonchi. Respiratory effort normal. No accessory muscle use.  Abdomen: soft nontender, nondistended, normal bowel sounds, no hepatosplenomegaly, no hernias  Musculoskeletal: : no cyanosis, clubbing or edema noted bilaterally  Neuro: Cranial nerves II-XII intact, strength, sensation, reflexes Psych: judgement and insight appear normal, stable mood and affect, mental status Skin: no rashes or lesions or ulcers, no induration or nodules    Data reviewed:  I have personally reviewed following labs and imaging studies Labs:  CBC: Recent Labs  Lab 05/05/18 0529 05/06/18 0508  WBC 20.9* 16.6*  HGB 12.5* 11.2*  HCT 38.3* 34.2*  MCV 100.8* 101.5*  PLT 208 809    Basic Metabolic Panel: Recent Labs  Lab 05/05/18 0529 05/06/18 0508  NA 139 140  K 3.9 3.9  CL 105 107  CO2 28 28  GLUCOSE 157* 114*  BUN 14 15  CREATININE 0.91 0.96  CALCIUM 8.6* 8.6*   GFR Estimated Creatinine Clearance: 61.1 mL/min (by C-G formula based on SCr of 0.96 mg/dL). Liver Function Tests: No results for input(s): AST, ALT, ALKPHOS, BILITOT, PROT, ALBUMIN in the last 168  hours. No results for input(s): LIPASE, AMYLASE in the last 168 hours. No results for input(s): AMMONIA in the last 168 hours. Coagulation profile Recent Labs  Lab 05/04/18 0600 05/05/18 0529 05/06/18 0508  INR 1.08 1.12 1.25    Cardiac Enzymes: No results for input(s): CKTOTAL, CKMB, CKMBINDEX, TROPONINI in the last 168 hours. BNP: Invalid input(s): POCBNP CBG: No results for input(s): GLUCAP in the last  168 hours. D-Dimer No results for input(s): DDIMER in the last 72 hours. Hgb A1c No results for input(s): HGBA1C in the last 72 hours. Lipid Profile No results for input(s): CHOL, HDL, LDLCALC, TRIG, CHOLHDL, LDLDIRECT in the last 72 hours. Thyroid function studies No results for input(s): TSH, T4TOTAL, T3FREE, THYROIDAB in the last 72 hours.  Invalid input(s): FREET3 Anemia work up No results for input(s): VITAMINB12, FOLATE, FERRITIN, TIBC, IRON, RETICCTPCT in the last 72 hours. Urinalysis    Component Value Date/Time   COLORURINE RED (A) 01/21/2015 0850   APPEARANCEUR CLOUDY (A) 01/21/2015 0850   LABSPEC <1.005 (L) 01/21/2015 0850   PHURINE 7.5 01/21/2015 0850   GLUCOSEU NEGATIVE 01/21/2015 0850   HGBUR LARGE (A) 01/21/2015 0850   BILIRUBINUR neg 02/14/2015 1247   KETONESUR NEGATIVE 01/21/2015 0850   PROTEINUR neg 02/14/2015 1247   PROTEINUR 100 (A) 01/21/2015 0850   UROBILINOGEN negative 02/14/2015 1247   UROBILINOGEN 0.2 01/21/2015 0850   NITRITE neg 02/14/2015 1247   NITRITE POSITIVE (A) 01/21/2015 0850   LEUKOCYTESUR Negative 02/14/2015 1247     Microbiology No results found for this or any previous visit (from the past 240 hour(s)).     Inpatient Medications:   Scheduled Meds: . bupivacaine liposome  20 mL Infiltration Once  . diltiazem  120 mg Oral Daily  . docusate sodium  100 mg Oral BID  . enoxaparin (LOVENOX) injection  85 mg Subcutaneous Q12H  . ezetimibe  10 mg Oral Daily  . ferrous sulfate  325 mg Oral TID PC  . finasteride  5 mg Oral  Daily  . pantoprazole  40 mg Oral Daily  . polyvinyl alcohol  1 drop Both Eyes QHS  . Warfarin - Pharmacist Dosing Inpatient   Does not apply q1800   Continuous Infusions: . sodium chloride Stopped (05/05/18 1449)  . sodium chloride 500 mL (05/06/18 2330)  . sodium chloride 500 mL (05/07/18 0100)     Radiological Exams on Admission: No results found.  Impression/Recommendations Principal Problem:   H/O total hip arthroplasty, left Active Problems:   Atrial fibrillation with RVR (HCC)   Hypotension  1. L THA - 1. Ortho following 2. Lovenox bridge to coumadin 2. Hypotension and A.Fib going in to RVR - 1. Rapid response to fluids suggests a volume depletion state: DDx including but not limited to: 1. Dehydration or sepsis seems most likely given rapid response to IVF 2. Blood loss seems less likely this many days out after surgery, will check CBC though to make sure there isnt a HGB drop 3. Will check troponin, since R sided MI can also rapidly respond to IVF, but no EKG changes to suggest this, and patient clearly denies SOB and CP. 4. PE is possible but patient is already on lovenox bridge to coumadin.  Also has no symptoms of CP, SOB, etc, and had a very fast response to only a small amount of IVF. 2. Cont IVF 3. Hold coreg, continue cardizem, hold lasix 4. CBC/BMP, lactate, UA, pro calcitonin, troponin 5. CXR 6. Keep close eye on temperature, but not yet enough indication at this point to start ABx 7. Patient transferred to SDU.   Thank you for this consultation.  Our [facility] hospitalist team will follow the patient with you.    GARDNER, JARED M. D.O. [facility] 05/07/2018, 1:14 AM

## 2018-05-07 NOTE — Progress Notes (Signed)
Patients blood pressure remained low but stable after transfer.  Labs drawn upon transfer demonstrated a significant drop in Hg and warrants continued monitoring.  Patients pain is manageable with oxy but still remains.  Hesitate to give patient iv pain meds due to low BP.  Urine out put is minimal at this point as well.

## 2018-05-07 NOTE — Progress Notes (Signed)
MEDICINE CONSULTATION  BRAY VICKERMAN WNU:[id number] DOB: [birth year] DOA: 05/04/2018 PCP: Chipper Herb, MD  HPI/brief narrative  [name] is a 84 y.o. year old male with medical history significant for atrial fibrillation on Coreg and Cardizem who underwent planned left total hip arthroplasty on 05/04/2018.  Medicine was consulted on evening of 7/26 as he developed hypotension with SBP in the 80s and went into A. fib with RVR (range 130s to 140s).  He was found to have hemoglobin of 8 previously 11.2 on 7/25/postop).  Of note patient has been on Lovenox bridge to Coumadin (long-term anticoagulation for atrial fibrillation)    Subjective No complaints.  Denies chest pain, denies abdominal pain, no dyspnea  Assessment/Plan: Principal Problem:   H/O total hip arthroplasty, left Active Problems:   Atrial fibrillation with RVR (HCC)   Hypotension   Acute anemia, suspect acute blood loss. - Suspect postop bleeding given increased left hip swelling in the setting of Lovenox bridge to Coumadin therapy - stat CT scan of left hip, will give bolus 500 cc IV fluids while awaiting blood transfusion - Lower suspicion for GI bleed given no reports of melanotic stool, still awaiting BM to test for Hemoccult - 2 units packed red blood cells (goal hemoglobin greater than 8 with cardiac history)  Hypotension, improving -SBP's in the 90s currently (previously in the 70s prior to transfer to stepdown unit) -Troponin x2 negative and EKG with no acute ischemic changes, do not suspect right heart MI -Lactic acid x2 within normal limits - PE less likely given previously anticoagulated (INR 1.8) with no respiratory symptoms, continue to monitor -Continue maintenance fluids - IV fluid boluses mentioned above -Hold home Coreg, and Lasix  Atrial fibrillation, rate improving - Heart rate this morning in the 110s - Goal heart rate less than 110 - Elevated heart rate likely related to worsening  anemia -Continue p.o. diltiazem 120 mg, if unable to maintain at goal heart rate will consider IV dosing Code Status: Full code  Family Communication: Wife at bedside   Telemetry:  DVT prophylaxis: SCDs   Objective: Vitals:   05/07/18 0730 05/07/18 0733 05/07/18 0800 05/07/18 0835  BP: (!) 78/51 (!) 101/51 (!) 92/52 (!) 97/48  Pulse: (!) 121 (!) 114 (!) 119 (!) 120  Resp: 15 (!) 21 17 18   Temp:   98.9 F (37.2 C)   TempSrc:   Oral   SpO2: 94% 91% 96% 96%  Weight:      Height:        Intake/Output Summary (Last 24 hours) at 05/07/2018 0347 Last data filed at 05/07/2018 0700 Gross per 24 hour  Intake 1871.67 ml  Output 250 ml  Net 1621.67 ml   Filed Weights   05/04/18 0624  Weight: 86.2 kg (190 lb)    Exam:  Constitutional:normal appearing male Eyes: EOMI, anicteric, normal conjunctivae ENMT: Oropharynx with moist mucous membranes, normal dentition Cardiovascular: Tachycardic, irregularly irregular no MRGs, with no peripheral edema Respiratory: Normal respiratory effort on room air, clear breath sounds  Abdomen: Soft,non-tender, normal bowel sounds MSK: Left hip significantly more swollen than right, dressing in place on lateral side looks clean dry and intact Neurologic: Grossly no focal neuro deficit. Psychiatric:Appropriate affect, and mood. Mental status AAOx3  Data Reviewed: CBC: Recent Labs  Lab 05/05/18 0529 05/06/18 0508 05/07/18 0149 05/07/18 0837  WBC 20.9* 16.6* 20.1*  --   HGB 12.5* 11.2* 8.3* 7.7*  HCT 38.3* 34.2* 25.0* 23.2*  MCV 100.8* 101.5* 100.8*  --  PLT 208 175 174  --    Basic Metabolic Panel: Recent Labs  Lab 05/05/18 0529 05/06/18 0508 05/07/18 0149 05/07/18 0837  NA 139 140 136  --   K 3.9 3.9 4.3  --   CL 105 107 104  --   CO2 28 28 26   --   GLUCOSE 157* 114* 119*  --   BUN 14 15 23   --   CREATININE 0.91 0.96 1.42*  --   CALCIUM 8.6* 8.6* 7.6*  --   MG  --   --   --  1.8   GFR: Estimated Creatinine Clearance:  41.3 mL/min (A) (by C-G formula based on SCr of 1.42 mg/dL (H)). Liver Function Tests: No results for input(s): AST, ALT, ALKPHOS, BILITOT, PROT, ALBUMIN in the last 168 hours. No results for input(s): LIPASE, AMYLASE in the last 168 hours. No results for input(s): AMMONIA in the last 168 hours. Coagulation Profile: Recent Labs  Lab 05/04/18 0600 05/05/18 0529 05/06/18 0508 05/07/18 0149  INR 1.08 1.12 1.25 1.88   Cardiac Enzymes: Recent Labs  Lab 05/07/18 0149 05/07/18 0837  TROPONINI <0.03 <0.03   BNP (last 3 results) No results for input(s): PROBNP in the last 8760 hours. HbA1C: No results for input(s): HGBA1C in the last 72 hours. CBG: No results for input(s): GLUCAP in the last 168 hours. Lipid Profile: No results for input(s): CHOL, HDL, LDLCALC, TRIG, CHOLHDL, LDLDIRECT in the last 72 hours. Thyroid Function Tests: No results for input(s): TSH, T4TOTAL, FREET4, T3FREE, THYROIDAB in the last 72 hours. Anemia Panel: No results for input(s): VITAMINB12, FOLATE, FERRITIN, TIBC, IRON, RETICCTPCT in the last 72 hours. Urine analysis:    Component Value Date/Time   COLORURINE YELLOW 05/07/2018 0204   APPEARANCEUR CLEAR 05/07/2018 0204   LABSPEC 1.024 05/07/2018 0204   PHURINE 5.0 05/07/2018 0204   GLUCOSEU NEGATIVE 05/07/2018 0204   HGBUR NEGATIVE 05/07/2018 0204   BILIRUBINUR SMALL (A) 05/07/2018 0204   BILIRUBINUR neg 02/14/2015 1247   KETONESUR NEGATIVE 05/07/2018 0204   PROTEINUR NEGATIVE 05/07/2018 0204   UROBILINOGEN negative 02/14/2015 1247   UROBILINOGEN 0.2 01/21/2015 0850   NITRITE NEGATIVE 05/07/2018 0204   LEUKOCYTESUR NEGATIVE 05/07/2018 0204   Sepsis Labs: @LABRCNTIP (procalcitonin:4,lacticidven:4)  )No results found for this or any previous visit (from the past 240 hour(s)).    Studies: Dg Chest Port 1 View  Result Date: 05/07/2018 CLINICAL DATA:  Hypotensive EXAM: PORTABLE CHEST 1 VIEW COMPARISON:  03/15/2018 FINDINGS: Post sternotomy  changes. Cardiomegaly. No acute airspace disease or effusion. No pneumothorax. Skin fold artifact over the left lower chest. IMPRESSION: No active disease.  Cardiomegaly. Electronically Signed   By: Donavan Foil M.D.   On: 05/07/2018 01:53    Scheduled Meds: . sodium chloride   Intravenous Once  . diltiazem  120 mg Oral Daily  . docusate sodium  100 mg Oral BID  . ezetimibe  10 mg Oral Daily  . ferrous sulfate  325 mg Oral TID PC  . finasteride  5 mg Oral Daily  . pantoprazole  40 mg Oral Daily  . polyvinyl alcohol  1 drop Both Eyes QHS    Continuous Infusions: . sodium chloride 100 mL/hr at 05/07/18 0153     LOS: 3 days     Desiree Hane, MD [facility] Pager [phone]  If 7PM-7AM, please contact night-coverage [email or web address] Password [facility] 05/07/2018, 9:27 AM

## 2018-05-07 NOTE — Progress Notes (Signed)
Subjective: 3 Days Post-Op Procedure(s) (LRB): LEFT TOTAL HIP ARTHROPLASTY (Left) Patient reports pain as 3 on 0-10 scale.He developed Atrial Fib. And his HBG dropped to 8.2. He was on Lovenox and Coumadin. Will repeat his HBG at 10:00.His Left Thigh is increased in size and he had some bleeding into his thigh but this is a sudden drop from 11.0 to 8.2.    Objective: Vital signs in last 24 hours: Temp:  [98.1 F (36.7 C)-99.8 F (37.7 C)] 98.1 F (36.7 C) (07/27 0400) Pulse Rate:  [86-131] 115 (07/27 0700) Resp:  [14-17] 16 (07/27 0700) BP: (70-157)/(41-105) 97/58 (07/27 0700) SpO2:  [88 %-97 %] 93 % (07/27 0700)  Intake/Output from previous day: 07/26 0701 - 07/27 0700 In: 2091.7 [P.O.:600; I.V.:991.7; IV Piggyback:500] Out: 250 [Urine:250] Intake/Output this shift: No intake/output data recorded.  Recent Labs    05/05/18 0529 05/06/18 0508 05/07/18 0149  HGB 12.5* 11.2* 8.3*   Recent Labs    05/06/18 0508 05/07/18 0149  WBC 16.6* 20.1*  RBC 3.37* 2.48*  HCT 34.2* 25.0*  PLT 175 174   Recent Labs    05/06/18 0508 05/07/18 0149  NA 140 136  K 3.9 4.3  CL 107 104  CO2 28 26  BUN 15 23  CREATININE 0.96 1.42*  GLUCOSE 114* 119*  CALCIUM 8.6* 7.6*   Recent Labs    05/06/18 0508 05/07/18 0149  INR 1.25 1.88    Dorsiflexion/Plantar flexion intact  Anticipated LOS equal to or greater than 2 midnights due to - Age 82 and older with one or more of the following:  - Obesity  - Expected need for hospital services (PT, OT, Nursing) required for safe  discharge  - Anticipated need for postoperative skilled nursing care or inpatient rehab  - Active co-morbidities: Atrial Fib. OR   - Unanticipated findings during/Post Surgery: Lab abnormalities  - Patient is a high risk of re-admission due to: Non-elective hospital admission within previous 6 months   Assessment/Plan: 3 Days Post-Op Procedure(s) (LRB): LEFT TOTAL HIP ARTHROPLASTY (Left) Up with  therapy    [name] 05/07/2018, 7:24 AM

## 2018-05-08 LAB — BASIC METABOLIC PANEL
ANION GAP: 7 (ref 5–15)
BUN: 31 mg/dL — AB (ref 8–23)
CALCIUM: 7.9 mg/dL — AB (ref 8.9–10.3)
CO2: 24 mmol/L (ref 22–32)
Chloride: 104 mmol/L (ref 98–111)
Creatinine, Ser: 1.17 mg/dL (ref 0.61–1.24)
GFR calc Af Amer: >60 mL/min (ref >60)
GFR calc non Af Amer: 56 mL/min — ABNORMAL LOW (ref >60)
GLUCOSE: 109 mg/dL — AB (ref 70–99)
Potassium: 4.4 mmol/L (ref 3.5–5.1)
Sodium: 135 mmol/L (ref 135–145)

## 2018-05-08 LAB — CBC
HCT: 25 % — ABNORMAL LOW (ref 39.0–52.0)
HCT: 24 % — ABNORMAL LOW (ref 39.0–52.0)
HEMOGLOBIN: 8.3 g/dL — AB (ref 13.0–17.0)
Hemoglobin: 8.3 g/dL — ABNORMAL LOW (ref 13.0–17.0)
MCH: 33.5 pg (ref 26.0–34.0)
MCH: 31.9 pg (ref 26.0–34.0)
MCHC: 33.2 g/dL (ref 30.0–36.0)
MCHC: 34.6 g/dL (ref 30.0–36.0)
MCV: 100.8 fL — ABNORMAL HIGH (ref 78.0–100.0)
MCV: 92.3 fL (ref 78.0–100.0)
Platelets: 174 10*3/uL (ref 150–400)
Platelets: 159 10*3/uL (ref 150–400)
RBC: 2.48 MIL/uL — ABNORMAL LOW (ref 4.22–5.81)
RBC: 2.6 MIL/uL — AB (ref 4.22–5.81)
RDW: 13.3 % (ref 11.5–15.5)
RDW: 17.6 % — ABNORMAL HIGH (ref 11.5–15.5)
WBC: 20.1 10*3/uL — ABNORMAL HIGH (ref 4.0–10.5)
WBC: 19.1 10*3/uL — ABNORMAL HIGH (ref 4.0–10.5)

## 2018-05-08 LAB — HEMOGLOBIN AND HEMATOCRIT, BLOOD
HCT: 24 % — ABNORMAL LOW (ref 39.0–52.0)
Hemoglobin: 8.3 g/dL — ABNORMAL LOW (ref 13.0–17.0)

## 2018-05-08 LAB — PROTIME-INR
INR: 2.24
PROTHROMBIN TIME: 24.6 s — AB (ref 11.4–15.2)

## 2018-05-08 LAB — PROCALCITONIN: Procalcitonin: 0.38 ng/mL

## 2018-05-08 MED ORDER — POLYETHYL GLYCOL-PROPYL GLYCOL 0.4-0.3 % OP SOLN
1.0000 [drp] | Freq: Every day | OPHTHALMIC | Status: DC
  Administered 2018-05-09 – 2018-05-12 (×4): 1 [drp] via OPHTHALMIC

## 2018-05-08 MED ORDER — NON FORMULARY: Freq: Every day | Status: DC

## 2018-05-08 MED ORDER — VITAMIN K1 10 MG/ML IJ SOLN
5.0000 mg | Freq: Once | INTRAMUSCULAR | Status: AC
  Administered 2018-05-08: 5 mg via INTRAVENOUS
  Filled 2018-05-08: qty 0.5

## 2018-05-08 MED ORDER — FUROSEMIDE 10 MG/ML IJ SOLN
40.0000 mg | Freq: Once | INTRAMUSCULAR | Status: AC
  Administered 2018-05-08: 40 mg via INTRAVENOUS
  Filled 2018-05-08: qty 4

## 2018-05-08 MED ORDER — SALINE SPRAY 0.65 % NA SOLN
1.0000 | NASAL | Status: DC | PRN
Start: 2018-05-08 — End: 2018-05-13
  Filled 2018-05-08: qty 44

## 2018-05-08 NOTE — Progress Notes (Signed)
Notified Gioffre, MD of repeat hgb sustaining at 8.3. Orders for repeat hgb and INR discontinued for tonight. Will recheck values at 0500 05/09/18.

## 2018-05-08 NOTE — Progress Notes (Addendum)
Occupational Therapy Treatment Patient Details Name: [name] MRN: [id number] DOB: Apr 28, 1932 Today's Date: 05/08/2018    History of present illness 84 yo male s/p L THA-posterior approach. Rapid respons 7/27 for A fib. Transferred to [facility] including R THA, R TKA, cardiac bypass surgery in 2005.    OT comments  Pt limited this session by pain and fatigue. He was only able to stand from recliner and take a couple steps before needing to sit down. Chair pulled up and pt assisted into chair. Nursing in room and aware of HR in 130s during activity today. Will continue to follow. Pt current functional status much different than on eval. Pt may need SNF if not able to progress back to level at eval. Will continue to follow and monitor for most appropriate d/c recommendation.     Follow Up Recommendations  Follow surgeon's recommendation for DC plan and follow-up therapies    Equipment Recommendations  3 in 1 bedside commode(in room)    Recommendations for Other Services      Precautions / Restrictions Precautions Precautions: Posterior Hip Precaution Comments: Handout administered. Pt able to recall 2/3 hip preacautions Restrictions Weight Bearing Restrictions: Yes LLE Weight Bearing: Weight bearing as tolerated LLE Partial Weight Bearing Percentage or Pounds: Original orders for [facility] however PT progress notes state pt is WBAT per Dr Gladstone Lighter on 7/25       Mobility Bed Mobility               General bed mobility comments: oob in recliner  Transfers Overall transfer level: Needs assistance Equipment used: Rolling walker (2 wheeled) Transfers: Sit to/from Stand Sit to Stand: Mod assist;+2 physical assistance;+2 safety/equipment         General transfer comment: Assist to rise, stabilize, control descent, position L LE. Increased time. VCs safety, technique, hand/LE placement. Pt is weak and unsteady.     Balance Overall balance assessment: Needs assistance          Standing balance support: Bilateral upper extremity supported Standing balance-Leahy Scale: Poor                             ADL either performed or assessed with clinical judgement   ADL                           Toilet Transfer: +2 for physical assistance;+2 for safety/equipment;Moderate assistance;Ambulation;RW   Toileting- Clothing Manipulation and Hygiene: +2 for physical assistance;+2 for safety/equipment;Total assistance         General ADL Comments: Only able to stand from recliner and take 2 steps before pt fatiguing and stating he has to sit down. HR into 130s during session with nursing in room and aware. Assisted pt back into recliner and made comfortable. Reviewed hip precautions at start of session and pt able to state 2/3. Reviewed all precautions and also discussed AE options. Pt states he has used a reacher in the past but no longer has one. He states he will have assist with LB self care and is not interested in AE at this time. Emphasized that if pt does not want AE he will definitely need assist with LB self care in order to not break hip precautions. Pt aware.      Vision Patient Visual Report: No change from baseline     Perception     Praxis      Cognition Arousal/Alertness:  Awake/alert Behavior During Therapy: WFL for tasks assessed/performed Overall Cognitive Status: Within Functional Limits for tasks assessed                                          Exercises     Shoulder Instructions       General Comments      Pertinent Vitals/ Pain       Pain Assessment: 0-10 Pain Score: 6  Pain Location: L hip  Pain Descriptors / Indicators: Aching;Sore Pain Intervention(s): Limited activity within patient's tolerance;Repositioned  Home Living                                          Prior Functioning/Environment              Frequency  Min 2X/week        Progress Toward  Goals  OT Goals(current goals can now be found in the care plan section)  Progress towards OT goals: Not progressing toward goals - comment(much more limited this session than at eval)     Plan Discharge plan needs to be updated    Co-evaluation    PT/OT/SLP Co-Evaluation/Treatment: Yes Reason for Co-Treatment: For patient/therapist safety   OT goals addressed during session: ADL's and self-care;Proper use of Adaptive equipment and DME      AM-PAC PT "6 Clicks" Daily Activity     Outcome Measure   Help from another person eating meals?: None Help from another person taking care of personal grooming?: A Little Help from another person toileting, which includes using toliet, bedpan, or urinal?: A Lot Help from another person bathing (including washing, rinsing, drying)?: A Lot Help from another person to put on and taking off regular upper body clothing?: A Lot Help from another person to put on and taking off regular lower body clothing?: A Lot 6 Click Score: 15    End of Session Equipment Utilized During Treatment: Gait belt  OT Visit Diagnosis: Unsteadiness on feet (R26.81);Muscle weakness (generalized) (M62.81);Pain Pain - Right/Left: Left Pain - part of body: Hip   Activity Tolerance Patient limited by pain;Patient limited by fatigue   Patient Left in chair;with call bell/phone within reach;with nursing/sitter in room   Nurse Communication          Time: 2023-3435 OT Time Calculation (min): 19 min  Charges: OT General Charges $OT Visit: 1 Visit OT Treatments $Therapeutic Activity: 8-22 mins     Philippa Chester 05/08/2018, 12:59 PM

## 2018-05-08 NOTE — Progress Notes (Signed)
Subjective: 4 Days Post-Op Procedure(s) (LRB): LEFT TOTAL HIP ARTHROPLASTY (Left) Patient reports pain as 4 on 0-10 scale. His INRINCREASED to 2.24 since yesterday and he has not had any Coumadin since Friday.he initially had Lovenox Thursday and Friday along with Coumadin.His HBG decreased to 8.3 this A.M. Will monitor it at [facility] and at 6:00 tonight.Case discussed with pharmacy and will give Vitamin K since his INR is elevated and he has some bleeding into his thigh.I will follow his HBG closely.  Objective: Vital signs in last 24 hours: Temp:  [97.8 F (36.6 C)-99.9 F (37.7 C)] 99.5 F (37.5 C) (07/28 0800) Pulse Rate:  [39-121] 117 (07/28 0800) Resp:  [10-25] 15 (07/28 0800) BP: (83-150)/(44-117) 139/65 (07/28 0800) SpO2:  [86 %-100 %] 97 % (07/28 0800)  Intake/Output from previous day: 07/27 0701 - 07/28 0700 In: 1440.8 [P.O.:340; I.V.:317.3; Blood:783.5] Out: 600 [Urine:600] Intake/Output this shift: No intake/output data recorded.  Recent Labs    05/06/18 0508 05/07/18 0149 05/07/18 0837 05/07/18 1938 05/08/18 0313  HGB 11.2* 8.3* 7.7* 9.1* 8.3*   Recent Labs    05/07/18 0149  05/07/18 1938 05/08/18 0313  WBC 20.1*  --   --  19.1*  RBC 2.48*  --   --  2.60*  HCT 25.0*   < > 26.8* 24.0*  PLT 174  --   --  159   < > = values in this interval not displayed.   Recent Labs    05/07/18 0149 05/08/18 0313  NA 136 135  K 4.3 4.4  CL 104 104  CO2 26 24  BUN 23 31*  CREATININE 1.42* 1.17  GLUCOSE 119* 109*  CALCIUM 7.6* 7.9*   Recent Labs    05/07/18 0149 05/08/18 0313  INR 1.88 2.24    Dorsiflexion/Plantar flexion intact    Assessment/Plan: 4 Days Post-Op Procedure(s) (LRB): LEFT TOTAL HIP ARTHROPLASTY (Left) Up with therapy    [name] 05/08/2018, 8:25 AM

## 2018-05-08 NOTE — Progress Notes (Signed)
Physical Therapy Treatment Patient Details Name: [name] MRN: [id number] DOB: April 12, 1932 Today's Date: 05/08/2018    History of Present Illness 84 yo male s/p L THA-posterior approach. Rapid respons 7/27 for A fib. Transferred to [facility] including R THA, R TKA, cardiac bypass surgery in 2005.     PT Comments    Pt required Mod assist +2 to stand and attempt a few steps on today in ICU.  HR up to 137 bpm with standing and a few steps in room. He was unable to tolerate any further activity. Pt is much weaker today than he has been. Will continue to follow. May need to consider SNF placement.    Follow Up Recommendations  Follow surgeon's recommendation for DC plan and follow-up therapies (may need to consider SNF placement)     Equipment Recommendations  3in1 (PT)    Recommendations for Other Services OT consult     Precautions / Restrictions Precautions Precautions: Posterior Hip Precaution Comments: Handout administered. Pt able to recall 2/3 hip preacautions Restrictions Weight Bearing Restrictions: Yes LLE Weight Bearing: Weight bearing as tolerated LLE Partial Weight Bearing Percentage or Pounds: Original orders for [facility] however PT progress notes state pt is WBAT per Dr Gladstone Lighter on 7/25    Mobility  Bed Mobility               General bed mobility comments: oob in recliner  Transfers Overall transfer level: Needs assistance Equipment used: Rolling walker (2 wheeled) Transfers: Sit to/from Stand Sit to Stand: Mod assist;+2 safety/equipment;+2 physical assistance         General transfer comment: Assist to rise, stabilize, control descent, position L LE. Increased time. VCs safety, technique, hand/LE placement. Pt is weak and unsteady.   Ambulation/Gait Ambulation/Gait assistance: Mod assist;+2 physical assistance;+2 safety/equipment Gait Distance (Feet): 3 Feet Assistive device: Rolling walker (2 wheeled) Gait Pattern/deviations: Step-to pattern;Trunk  flexed;Decreased stance time - left;Antalgic     General Gait Details: Assist to stabilize pt and follow with recliner. VCs safety, technique, sequence, posture. HR up to 137 very soon after taking a step or two. Pt fatigued very easily. Unable to continue.    Stairs             Wheelchair Mobility    Modified Rankin (Stroke Patients Only)       Balance Overall balance assessment: Needs assistance         Standing balance support: Bilateral upper extremity supported Standing balance-Leahy Scale: Poor                              Cognition Arousal/Alertness: Awake/alert Behavior During Therapy: WFL for tasks assessed/performed Overall Cognitive Status: Within Functional Limits for tasks assessed                                        Exercises      General Comments        Pertinent Vitals/Pain Pain Assessment: 0-10 Pain Score: 6  Pain Location: L hip  Pain Descriptors / Indicators: Aching;Sore Pain Intervention(s): Limited activity within patient's tolerance;Repositioned    Home Living                      Prior Function            PT Goals (current goals can now be found  in the care plan section) Progress towards PT goals: Not progressing toward goals - comment(increased HR, poor activity tolerance, weakness)    Frequency    7X/week      PT Plan Current plan remains appropriate    Co-evaluation              AM-PAC PT "6 Clicks" Daily Activity  Outcome Measure  Difficulty turning over in bed (including adjusting bedclothes, sheets and blankets)?: Unable Difficulty moving from lying on back to sitting on the side of the bed? : Unable Difficulty sitting down on and standing up from a chair with arms (e.g., wheelchair, bedside commode, etc,.)?: Unable Help needed moving to and from a bed to chair (including a wheelchair)?: Total Help needed walking in hospital room?: Total Help needed climbing 3-5  steps with a railing? : Total 6 Click Score: 6    End of Session Equipment Utilized During Treatment: Gait belt Activity Tolerance: Patient limited by fatigue;Patient limited by pain Patient left: in chair;with call bell/phone within reach;with nursing/sitter in room   PT Visit Diagnosis: Muscle weakness (generalized) (M62.81);Other abnormalities of gait and mobility (R26.89);Unsteadiness on feet (R26.81);Difficulty in walking, not elsewhere classified (R26.2);Pain Pain - Right/Left: Left Pain - part of body: Leg     Time: 6659-9357 PT Time Calculation (min) (ACUTE ONLY): 19 min  Charges:  $Therapeutic Activity: 8-22 mins                        Weston Anna, MPT Pager: [phone]

## 2018-05-08 NOTE — Plan of Care (Signed)
No charge note  Brief narrative  [name] is a 84 y.o. year old male with medical history significant for atrial fibrillation on Coreg and Cardizem who underwent planned left total hip arthroplasty on 05/04/2018.  Medicine was consulted on evening of 7/26 as he developed hypotension with SBP in the 80s and went into A. fib with RVR (range 130s to 140s).  He was found to have hemoglobin of 8 previously 11.2 on 7/25/postop).  Of note patient has been on Lovenox bridge to Coumadin (long-term anticoagulation for atrial fibrillation). CT hip on 7/27 showed hematoma associated with recent surgery.  Patient received 2 units packed red blood cells on 7/27 with improvement in pain, from 7.7 to 9.  Hemoglobin 8.3 on 7/28.  Acute blood loss anemia, secondary to left hip postop bleed, stable - Managed per Ortho - Agree with goal hemoglobin of 8 (cardiac history)  Hypotension, resolved - Likely related to acute blood loss as mentioned above. -SBP is now in the 130s -Hold home Coreg and Lasix until stabilization of hematoma/anemia - IV fluid boluses as needed, judicious use as patient is EF 40%  Atrial fibrillation, currently rate controlled -Previous episodes of RVR related to acute blood loss anemia - Continue home diltiazem - If goes into RVR again despite stabilization of hemoglobin would recommend cardiology consult. - Agree with reversal of INR in setting of acute blood loss anemia  [facility] hospitalist Oretha Milch, MD  We will sign off, thank you for consult.

## 2018-05-09 ENCOUNTER — Ambulatory Visit: Payer: Medicare Other | Admitting: Family Medicine

## 2018-05-09 DIAGNOSIS — D62 Acute posthemorrhagic anemia: Secondary | ICD-10-CM

## 2018-05-09 DIAGNOSIS — E861 Hypovolemia: Secondary | ICD-10-CM

## 2018-05-09 DIAGNOSIS — Z96642 Presence of left artificial hip joint: Secondary | ICD-10-CM

## 2018-05-09 DIAGNOSIS — D649 Anemia, unspecified: Secondary | ICD-10-CM

## 2018-05-09 DIAGNOSIS — I4891 Unspecified atrial fibrillation: Secondary | ICD-10-CM

## 2018-05-09 DIAGNOSIS — I9589 Other hypotension: Secondary | ICD-10-CM

## 2018-05-09 LAB — PROTIME-INR
INR: 1.14
Prothrombin Time: 14.5 seconds (ref 11.4–15.2)

## 2018-05-09 LAB — CBC
HEMATOCRIT: 22.5 % — AB (ref 39.0–52.0)
Hemoglobin: 7.8 g/dL — ABNORMAL LOW (ref 13.0–17.0)
MCH: 31.8 pg (ref 26.0–34.0)
MCHC: 34.7 g/dL (ref 30.0–36.0)
MCV: 91.8 fL (ref 78.0–100.0)
Platelets: 192 10*3/uL (ref 150–400)
RBC: 2.45 MIL/uL — ABNORMAL LOW (ref 4.22–5.81)
RDW: 16.8 % — AB (ref 11.5–15.5)
WBC: 16.2 10*3/uL — ABNORMAL HIGH (ref 4.0–10.5)

## 2018-05-09 LAB — PREPARE RBC (CROSSMATCH)

## 2018-05-09 LAB — MAGNESIUM: Magnesium: 2.1 mg/dL (ref 1.7–2.4)

## 2018-05-09 LAB — HEMOGLOBIN AND HEMATOCRIT, BLOOD
HCT: 20.8 % — ABNORMAL LOW (ref 39.0–52.0)
Hemoglobin: 7.2 g/dL — ABNORMAL LOW (ref 13.0–17.0)

## 2018-05-09 LAB — PROCALCITONIN: Procalcitonin: 0.27 ng/mL

## 2018-05-09 MED ORDER — SODIUM CHLORIDE 0.9% IV SOLUTION
Freq: Once | INTRAVENOUS | Status: AC
  Administered 2018-05-09: 22:00:00 via INTRAVENOUS

## 2018-05-09 MED ORDER — FUROSEMIDE 10 MG/ML IJ SOLN
20.0000 mg | Freq: Once | INTRAMUSCULAR | Status: AC
  Administered 2018-05-10: 20 mg via INTRAVENOUS
  Filled 2018-05-09: qty 2

## 2018-05-09 MED ORDER — CARVEDILOL 12.5 MG PO TABS
12.5000 mg | ORAL_TABLET | Freq: Two times a day (BID) | ORAL | Status: DC
  Administered 2018-05-09 – 2018-05-10 (×2): 12.5 mg via ORAL
  Filled 2018-05-09 (×2): qty 1

## 2018-05-09 MED ORDER — FERROUS SULFATE 325 (65 FE) MG PO TABS: 325.0000 mg | ORAL_TABLET | Freq: Two times a day (BID) | ORAL | 0 refills | Status: DC

## 2018-05-09 MED ORDER — FUROSEMIDE 10 MG/ML IJ SOLN
40.0000 mg | Freq: Every day | INTRAMUSCULAR | Status: DC
  Administered 2018-05-09: 40 mg via INTRAVENOUS
  Filled 2018-05-09: qty 4

## 2018-05-09 MED ORDER — SODIUM CHLORIDE 0.9% IV SOLUTION: Freq: Once | INTRAVENOUS | Status: DC

## 2018-05-09 NOTE — Progress Notes (Signed)
Pt. Hemoglobin drop to 7.2. Dr. Gladstone Lighter called. Orders for 2 units of PRBC with lasix in between. New verbal orders received.

## 2018-05-09 NOTE — Progress Notes (Signed)
Physical Therapy Treatment Patient Details Name: [name] MRN: [id number] DOB: [birth year] Today's Date: 05/09/2018    History of Present Illness 84 yo male s/p L THA-posterior approach. Rapid response 7/27 for A fib. Transferred to [facility] including R THA, R TKA, cardiac bypass surgery in 2005.     PT Comments    Pt's mobility was limited today due to elevated HR and BP.  Pt only assisted to sitting EOB and then returned to supine.    Preactivity: 146/71 mmHg, 112 bpm During activity: 134/100 mmHg, 140-150s bpm    Follow Up Recommendations  Follow surgeon's recommendation for DC plan and follow-up therapies;SNF     Equipment Recommendations  3in1 (PT)    Recommendations for Other Services       Precautions / Restrictions Precautions Precautions: Posterior Hip Precaution Comments: pt verbalized 2/3 posterior hip precautions Restrictions LLE Weight Bearing: Weight bearing as tolerated    Mobility  Bed Mobility Overal bed mobility: Needs Assistance Bed Mobility: Supine to Sit;Sit to Supine     Supine to sit: Min assist Sit to supine: Min assist   General bed mobility comments: assist for L LE, verbal cues for maintaining precautions, HR increased to 150s bpm and BP 134/100 mmHg with only sitting EOB so deferred further mobility at this time  Transfers                    Ambulation/Gait                 Stairs             Wheelchair Mobility    Modified Rankin (Stroke Patients Only)       Balance                                            Cognition Arousal/Alertness: Awake/alert Behavior During Therapy: WFL for tasks assessed/performed Overall Cognitive Status: Within Functional Limits for tasks assessed                                        Exercises      General Comments        Pertinent Vitals/Pain Pain Assessment: 0-10 Pain Score: 2  Pain Location: L hip  Pain Descriptors /  Indicators: Sore Pain Intervention(s): Repositioned;Monitored during session;Limited activity within patient's tolerance    Home Living                      Prior Function            PT Goals (current goals can now be found in the care plan section) Progress towards PT goals: Not progressing toward goals - comment(medical issues limiting, elevated HR and BP)    Frequency    7X/week      PT Plan Current plan remains appropriate    Co-evaluation              AM-PAC PT "6 Clicks" Daily Activity  Outcome Measure  Difficulty turning over in bed (including adjusting bedclothes, sheets and blankets)?: Unable Difficulty moving from lying on back to sitting on the side of the bed? : Unable Difficulty sitting down on and standing up from a chair with arms (e.g., wheelchair, bedside commode, etc,.)?: Unable Help needed moving  to and from a bed to chair (including a wheelchair)?: Total Help needed walking in hospital room?: Total Help needed climbing 3-5 steps with a railing? : Total 6 Click Score: 6    End of Session   Activity Tolerance: Treatment limited secondary to medical complications (Comment) Patient left: in bed;with bed alarm set;with call bell/phone within reach Nurse Communication: Mobility status(notified of BP and HR) PT Visit Diagnosis: Muscle weakness (generalized) (M62.81);Other abnormalities of gait and mobility (R26.89);Unsteadiness on feet (R26.81);Difficulty in walking, not elsewhere classified (R26.2)     Time: 5747-3403 PT Time Calculation (min) (ACUTE ONLY): 20 min  Charges:  $Therapeutic Activity: 8-22 mins                     Carmelia Bake, PT, DPT 05/09/2018 Pager: [phone]  York Ram E 05/09/2018, 12:53 PM

## 2018-05-09 NOTE — Consult Note (Signed)
Cardiology Consultation:   Patient ID: [name]; [id number]; 05/27/1932   Admit date: 05/04/2018 Date of Consult: 05/09/2018  Primary Care Provider: Chipper Herb, MD Primary Cardiologist: Dr. Percival Spanish, MD   Patient Profile:   [name] is a 84 y.o. male with a hx of persistent atrial fibrillation in chronic Coumadin therapy, [name] s/p CABG 2005, cardiomyopathy with LVEF of 40-45% per echo 03/2017, hypertension, hyperlipidemia and carotid stenosis who is being seen today for anticoagulation recommendations and suspected CHF exacerbation at the request of Dr. Emiliano Dyer.  History of Present Illness:   [name] is an 84 year old male with a history as stated above who presented to [facility] on 05/04/2018 for a planned left total hip arthroplasty. Internal medicine was consulted on 05/07/2018 secondary to orthostatic hypotension with SBP in the 80s treated with several NS bolus infusions. Additionally, he was found to be in Afib with RVR. He has a known history of persistent AF which is normally controlled with Coreg and Cardizem. He has been on a Lovenox bridge to Coumadin post-operatively. On 05/07/18, he was found to have a hemoglobin drop to 8.3 from 11.2 on 05/06/18. He was transfused several units of PRBC's with some improvement. His Lovenox and Coumadin were held secondary to the above. INR at that time was 1.8, then increased to 2.24 on 05/08/18. CT of his hip on 05/07/2018 showed hematoma associated with recent surgery.  He was given 2 PRBC units. Given his hx of [name], troponin levels drawn which were found to be negative and EKG with no acute ischemic changes noted. There is low suspicion for GI bleed, likely this event has been complicated by his anticoagulation bridge. Cardiology has now been consulted for anticoagulation recommendations and possible CHF therapy assistance.   Of note, he was last seen in the office on 12/16/2017 for follow-up of his [name] and atrial fibrillation.   Lexiscan Myoview performed 03/2017 secondary to chest pain demonstrated infarct but no ischemia.  LVEF on echocardiogram was 40 to 45%. During this visit he was also evaluated preoperatively for his upcoming left hip surgery. He was cleared for surgery at that time.   Past Medical History:  Diagnosis Date  . Anemia   . Atrial fibrillation ([place])   . Cataract   . Cellulitis    In past  . Complication of anesthesia    HARD TIME WAKING; CAUSES MY BP TO GO UP   . Coronary artery disease    5 bypasses  . DJD (degenerative joint disease)   . Ejection fraction < 50%    Mildly reduced, 40% by echo  . GERD (gastroesophageal reflux disease)   . Hyperlipidemia   . Hypertension   . Persistent atrial fibrillation ([name])   . Prostate hypertrophy    on CT scan 09/2014  . PUD (peptic ulcer disease)    RESOLVED   . Skin cancer of eyelid    Resected    Past Surgical History:  Procedure Laterality Date  . APPENDECTOMY    . BYPASS GRAFT  2005  . CATARACT EXTRACTION W/PHACO Left 07/22/2015   Procedure: CATARACT EXTRACTION PHACO AND INTRAOCULAR LENS PLACEMENT LEFT EYE CDE=8.14;  Surgeon: Tonny Branch, MD;  Location: [facility];  Service: Ophthalmology;  Laterality: Left;  . CORONARY ARTERY BYPASS GRAFT  4/05   LIMA to LAD, SVG to PDA, SVG to ramus intermediate  . EYE SURGERY  07/2015  . EYELID CARCINOMA EXCISION     Skin cancer resected  . Heart  bypass  2005  . TOTAL HIP ARTHROPLASTY     Right  . TOTAL HIP ARTHROPLASTY Left 05/04/2018   Procedure: LEFT TOTAL HIP ARTHROPLASTY;  Surgeon: [name], MD;  Location: [facility];  Service: Orthopedics;  Laterality: Left;  . TOTAL KNEE ARTHROPLASTY     Right     Prior to Admission medications   Medication Sig Start Date End Date Taking? Authorizing Provider  acetaminophen (TYLENOL) 650 MG CR tablet Take 650 mg by mouth every 8 (eight) hours as needed for pain.    Yes [provider]  carvedilol (COREG) 12.5 MG tablet TAKE 1&1/2 TABLET EVERY  MORNING & TAKE 1 TABLET EVERY EVENING Patient taking differently: Take 12.5 mg by mouth twice daily 03/14/18  Yes Chipper Herb, MD  Cholecalciferol (VITAMIN D3) 5000 units CAPS Take 5,000 Units by mouth every 30 (thirty) days.   Yes [provider]  diltiazem (CARDIZEM CD) 120 MG 24 hr capsule TAKE ONE (1) CAPSULE EACH DAY Patient taking differently: Take 120 mg by mouth once daily at night 01/10/18  Yes Chipper Herb, MD  ezetimibe (ZETIA) 10 MG tablet TAKE ONE (1) TABLET EACH DAY 02/15/18  Yes Chipper Herb, MD  finasteride (PROSCAR) 5 MG tablet TAKE ONE (1) TABLET EACH DAY 01/20/18  Yes Chipper Herb, MD  fluticasone [facility]) 50 MCG/ACT nasal spray Place 1 spray into both nostrils daily as needed for allergies or rhinitis.   Yes [provider]  furosemide (LASIX) 20 MG tablet Take 20 mg by mouth 4 (four) times a week.    Yes [provider]  lovastatin (MEVACOR) 20 MG tablet Take 1 tablet (20 mg total) by mouth at bedtime. Patient taking differently: Take 10 mg by mouth 2 (two) times a week.  12/08/16  Yes Cherre Robins, PharmD  Polyethyl Glycol-Propyl Glycol (SYSTANE OP) Place 1 drop into both eyes daily.   Yes [provider]  Polyethyl Glycol-Propyl Glycol (SYSTANE) 0.4-0.3 % GEL ophthalmic gel Place 1 application into the right eye at bedtime.    Yes [provider]  docusate sodium (COLACE) 100 MG capsule Take 1 capsule (100 mg total) by mouth 2 (two) times daily. 05/06/18   Cecilio Asper, Amber, PA-C  ferrous sulfate 325 (65 FE) MG tablet Take 1 tablet (325 mg total) by mouth 2 (two) times daily after a meal. 05/09/18   Constable, Amber, PA-C  methocarbamol (ROBAXIN) 500 MG tablet Take 1 tablet (500 mg total) by mouth every 8 (eight) hours as needed for muscle spasms. 05/06/18   Cecilio Asper, Amber, PA-C  oxyCODONE (OXY IR/ROXICODONE) 5 MG immediate release tablet Take 1-2 tablets (5-10 mg total) by mouth every 6 (six) hours as needed for moderate pain  (pain score 4-6). 05/06/18   Ardeen Jourdain, PA-C  warfarin (COUMADIN) 5 MG tablet Take 5 mg by mouth once daily on Sat, Sun, Tues, and Thurs.  Take 2.5 mg by mouth once daily on Mon, Wed, and Fri 05/02/18   Chipper Herb, MD    Inpatient Medications: Scheduled Meds: . diltiazem  120 mg Oral Daily  . docusate sodium  100 mg Oral BID  . ezetimibe  10 mg Oral Daily  . ferrous sulfate  325 mg Oral TID PC  . finasteride  5 mg Oral Daily  . pantoprazole  40 mg Oral Daily  . Polyethyl Glycol-Propyl Glycol  1 drop Both Eyes QHS   Continuous Infusions:  PRN Meds: acetaminophen, alum & mag hydroxide-simeth, bisacodyl, bisacodyl, calcium carbonate, fluticasone,  HYDROmorphone (DILAUDID) injection, magnesium citrate, menthol-cetylpyridinium **OR** phenol, methocarbamol, metoCLOPramide **OR** metoCLOPramide (REGLAN) injection, naloxone, ondansetron **OR** ondansetron (ZOFRAN) IV, oxyCODONE, oxyCODONE, polyethylene glycol, sodium chloride, sodium phosphate  Allergies:    Allergies  Allergen Reactions  . Penicillins Hives and Rash    Has patient had a PCN reaction causing immediate rash, facial/tongue/throat swelling, SOB or lightheadedness with hypotension: Yes Has patient had a PCN reaction causing severe rash involving mucus membranes or skin necrosis: Yes Has patient had a PCN reaction that required hospitalization: No Has patient had a PCN reaction occurring within the last 10 years: No If all of the above answers are "NO", then may proceed with Cephalosporin use.   . Hct [Hydrochlorothiazide] Other (See Comments)    hyper  . Statins Other (See Comments)    Myalgia, tolerates low dosages of lovastatin     Social History:   Social History   Socioeconomic History  . Marital status: Married    Spouse name: Not on file  . Number of children: Not on file  . Years of education: Not on file  . Highest education level: Not on file  Occupational History  . Occupation: retired  Photographer  . Financial resource strain: Not on file  . Food insecurity:    Worry: Not on file    Inability: Not on file  . Transportation needs:    Medical: Not on file    Non-medical: Not on file  Tobacco Use  . Smoking status: Former Smoker    Packs/day: 2.00    Years: 5.00    Pack years: 10.00    Types: Cigarettes    Last attempt to quit: 12/23/1963    Years since quitting: 54.4  . Smokeless tobacco: Never Used  Substance and Sexual Activity  . Alcohol use: No  . Drug use: No  . Sexual activity: Yes  Lifestyle  . Physical activity:    Days per week: Not on file    Minutes per session: Not on file  . Stress: Not on file  Relationships  . Social connections:    Talks on phone: Not on file    Gets together: Not on file    Attends religious service: Not on file    Active member of club or organization: Not on file    Attends meetings of clubs or organizations: Not on file    Relationship status: Not on file  . Intimate partner violence:    Fear of current or ex partner: Not on file    Emotionally abused: Not on file    Physically abused: Not on file    Forced sexual activity: Not on file  Other Topics Concern  . Not on file  Social History Narrative   Lives in a [place] home.    Family History:   Family History  Problem Relation Age of Onset  . Stroke Mother   . Stroke Father   . Hypertension Father   . Early death Brother        35 months old  . Cancer Brother        lung  . Stroke Brother        heat   Family Status:  Family Status  Relation Name Status  . Mother  Deceased at age 25  . Father  Deceased at age 86  . Brother  Deceased  . Brother  Deceased  . Brother  Alive  . Brother  Alive  . Daughter good health Other  married  . MGM  Deceased  . MGF  Deceased  . PGM  Deceased  . PGF  Deceased    ROS:  Please see the history of present illness.  All other ROS reviewed and negative.     Physical Exam/Data:   Vitals:   05/09/18 0710  05/09/18 1011 05/09/18 1100 05/09/18 1200  BP:  130/60  126/80  Pulse:    (!) 126  Resp:    17  Temp: 98.8 F (37.1 C)  98.8 F (37.1 C)   TempSrc: Oral  Oral   SpO2:    98%  Weight:      Height:        Intake/Output Summary (Last 24 hours) at 05/09/2018 1419 Last data filed at 05/09/2018 1200 Gross per 24 hour  Intake 2908.33 ml  Output 1725 ml  Net 1183.33 ml   Filed Weights   05/04/18 0624  Weight: 190 lb (86.2 kg)   Body mass index is 29.76 kg/m.   General: Elderly, NAD Skin: Warm, dry, intact  Head: Normocephalic, atraumatic, clear, moist mucus membranes. Neck: Negative for carotid bruits. No JVD Lungs: Mild crackles in lower lobes bilaterally. No wheezes. Breathing is unlabored. Cardiovascular: Irregularly irregular with S1 S2. No murmurs, rubs or gallops Abdomen: Distended, non-tender with normoactive bowel sounds. No obvious abdominal masses. MSK: Strength and tone appear normal for age. 5/5 in all extremities Extremities: L>R LE edema. L 2+, R 1+. No clubbing or cyanosis. DP/PT pulses 1+ bilaterally Neuro: Alert and oriented. No focal deficits. No facial asymmetry. MAE spontaneously. Psych: Responds to questions appropriately with normal affect.    EKG:  The EKG was personally reviewed and demonstrates:  05/06/18 Atrial fibrillation with RVR HR 116, no acute ischemic changes  Telemetry:  Telemetry was personally reviewed and demonstrates: 05/09/18 Atrial fibrillation HR 107  Relevant CV Studies:  ECHO: 04/05/2017: Study Conclusions  - Left ventricle: The cavity size was normal. Wall thickness was   increased in a pattern of mild LVH. Systolic function was mildly   to moderately reduced. The estimated ejection fraction was in the   range of 40% to 45%. Diffuse hypokinesis. The study is not   technically sufficient to allow evaluation of LV diastolic   function. - Aortic valve: Mildly calcified annulus. Trileaflet; mildly   thickened leaflets. Valve area  (VTI): 2.4 cm^2. Valve area   (Vmax): 2.35 cm^2. Valve area (Vmean): 2.46 cm^2. - Mitral valve: Mildly calcified annulus. Mildly thickened leaflets   . There was mild regurgitation. - Left atrium: The atrium was severely dilated. - Right ventricle: The cavity size was mildly dilated. - Right atrium: The atrium was severely dilated. - Technically adequate study.  Lexiscan Myoview stress test 04/05/2017:  There was no ST segment deviation noted during stress.  Findings consistent with prior inferior/inferolateral myocardial infarction. There is no current ischemia.  This is an intermediate risk study. Risk based on decreased ejection fraction, there is no current myocardium at jeopardy. Recommend correlating LVEF with echo  The left ventricular ejection fraction is mildly decreased (45-54%).  CATH: None   Laboratory Data:  Chemistry Recent Labs  Lab 05/06/18 0508 05/07/18 0149 05/08/18 0313  NA 140 136 135  K 3.9 4.3 4.4  CL 107 104 104  CO2 28 26 24   GLUCOSE 114* 119* 109*  BUN 15 23 31*  CREATININE 0.96 1.42* 1.17  CALCIUM 8.6* 7.6* 7.9*  GFRNONAA >60 44* 56*  GFRAA >60 51* >60  ANIONGAP 5 6 7  Total Protein  Date Value Ref Range Status  04/25/2018 7.4 6.5 - 8.1 g/dL Final  03/15/2018 6.7 6.0 - 8.5 g/dL Final   Albumin  Date Value Ref Range Status  04/25/2018 4.2 3.5 - 5.0 g/dL Final  03/15/2018 4.2 3.5 - 4.7 g/dL Final   AST  Date Value Ref Range Status  04/25/2018 20 15 - 41 U/L Final   ALT  Date Value Ref Range Status  04/25/2018 17 0 - 44 U/L Final    Comment:    Please note change in reference range.   Alkaline Phosphatase  Date Value Ref Range Status  04/25/2018 50 38 - 126 U/L Final   Total Bilirubin  Date Value Ref Range Status  04/25/2018 0.9 0.3 - 1.2 mg/dL Final   Bilirubin Total  Date Value Ref Range Status  03/15/2018 0.5 0.0 - 1.2 mg/dL Final   Hematology Recent Labs  Lab 05/07/18 0149  05/08/18 0313 05/08/18 1151  05/09/18 0357  WBC 20.1*  --  19.1*  --  16.2*  RBC 2.48*  --  2.60*  --  2.45*  HGB 8.3*   < > 8.3* 8.3* 7.8*  HCT 25.0*   < > 24.0* 24.0* 22.5*  MCV 100.8*  --  92.3  --  91.8  MCH 33.5  --  31.9  --  31.8  MCHC 33.2  --  34.6  --  34.7  RDW 13.3  --  17.6*  --  16.8*  PLT 174  --  159  --  192   < > = values in this interval not displayed.   Cardiac Enzymes Recent Labs  Lab 05/07/18 0149 05/07/18 0837 05/07/18 1938  TROPONINI <0.03 <0.03 <0.03   No results for input(s): TROPIPOC in the last 168 hours.  BNPNo results for input(s): BNP, PROBNP in the last 168 hours.  DDimer No results for input(s): DDIMER in the last 168 hours. TSH: No results found for: TSH Lipids: Lab Results  Component Value Date   CHOL 184 03/15/2018   HDL 49 03/15/2018   LDLCALC 110 (H) 03/15/2018   TRIG 127 03/15/2018   CHOLHDL 3.8 03/15/2018   HgbA1c: Lab Results  Component Value Date   HGBA1C 5.7% 02/27/2014    Radiology/Studies:  Ct Hip Left W Wo Contrast  Result Date: 05/07/2018 CLINICAL DATA:  84 year old male status post left hip arthroplasty EXAM: CT LEFT LOWER EXTREMITY WITHOUT AND WITH IV CONTRAST TECHNIQUE: Multidetector CT imaging of the lower left extremity was performed following the standard protocol before and during bolus administration of intravenous contrast. COMPARISON:  Plain film 05/04/2018, CT 02/28/2015 CONTRAST:  66mL ISOVUE-300 IOPAMIDOL (ISOVUE-300) INJECTION 61% FINDINGS: Bones/Joint/Cartilage Surgical changes of left hip arthroplasty. The acetabular and femoral components are congruent. No fracture adjacent to the femoral components. No acetabular fracture. Visualized bony pelvis without fracture. Ligaments Suboptimally assessed by CT. Soft tissues Intermediate density fluid within the superficial soft tissues of the proximal left thigh, incompletely imaged. Greatest diameter 7.3 cm x 3.7 cm on axial images. This fluid collection is superficial to the tensor fascia lata.  Gas and fluid within the quadriceps musculature, extending from the proximal thigh to the distal thigh. Small amount of gas within the deep am string musculature myofascial planes in the proximal thigh extending medially into the adductor musculature. Intermediate density fluid collection with a fluid fluid level starting in the gluteal musculature, and extending into the hamstring musculature of the posterior thigh. The metallic artifact somewhat limits evaluation, however, estimated diameter  on axial images 8 0.0 cm x 5.4 cm, and an estimated cranial caudal span of approximately 15.6 cm on the coronal reformatted images. Vasculature: Atherosclerotic changes of the left iliac system and the common femoral artery. Arteries are patent. The CT is not optimized for the most sensitive evaluation of the arteries (not a CT angiogram), however, there is no evidence of hyperdense pooling contrast to suggest extravasation. IMPRESSION: Early postoperative changes of left hip arthroplasty, with expected gas and fluid within the surgical bed including quadriceps musculature, hamstring musculature, and the adductor musculature. Hematoma of the gluteal musculature with a fluid-fluid level, largest diameter on axial images 8 cm x 5.4 cm, and largest cranial caudal span on the coronal images of 15.6 cm. Hematoma in the superficial tissues overlying the tensor fascia lata measuring as great as 7.3 cm on the axial images. Atherosclerosis. Electronically Signed   By: Corrie Mckusick D.O.   On: 05/07/2018 11:37   Dg Chest Port 1 View  Result Date: 05/07/2018 CLINICAL DATA:  Hypotensive EXAM: PORTABLE CHEST 1 VIEW COMPARISON:  03/15/2018 FINDINGS: Post sternotomy changes. Cardiomegaly. No acute airspace disease or effusion. No pneumothorax. Skin fold artifact over the left lower chest. IMPRESSION: No active disease.  Cardiomegaly. Electronically Signed   By: Donavan Foil M.D.   On: 05/07/2018 01:53   Assessment and Plan:   1.   Persistent atrial fibrillation: -Patient on chronic Coumadin therapy for atrial fibrillation normally controlled with Coreg and diltiazem, followed at [facility] for INR. He was evaluated by cardiology on 12/16/2017 for upcoming procedure. It was noted that he was able to hold his Coumadin for upcoming procedure.  3 days postop, patient was found to be in A fib RVR and hypotensive.  Unfortunately, he was found to have a hemoglobin drop from 11.2-8.3 on 05/07/2018 and his Lovenox and Coumadin were held at this time. INR today, 1.14. CT of left hip revealed hematoma of the gluteal musculature and superficial tissues anticoagulation. -Continue diltiazem 120 mg daily -Will add home dose carvedilol 12.5 mg twice daily for greater rate control. HR currently in the low 100s -Will hold all anticoagulation for at least a week, then can consider resuming Coumadin without Lovenox bridge. -CHA2DS2VASc =4  2.  Acute blood loss anemia secondary to left hip postoperative bleeding: -Hb today 7.8>>>per ortho  -See plan for anticoagulation resumption above  3.  Acute on chronic systolic congestive heart failure: -Prior echocardiogram 04/05/2017 with LV EF of 40 to 45% with diffuse hypokinesis -Weight, noted to be 193lb last office visit on 06/02/2017>> 190lb on admission 05/04/2018 -I&O, net +5.4 L since admission, 24-hour total output 1.8 L -Fluid volume overload on exam likely in the setting of fluid volume resuscitation secondary to hypotension and recent surgical intervention -Home dose Lasix 20 mg (Sunday, Tuesday, Thursday, Saturday) currently on hold -Will start IV Lasix 40 mg daily staring today and reassess in AM if dose needs to be adjusted  -Strict I&O and daily weights  4.  Hypotension: -Resolved, likely secondary to acute blood loss as outlined above -126/80, 130/60, 124/72 -Home dose Lasix 20 mg (Sunday, Tuesday, Thursday, Saturday) currently on hold -Will resume home dose carvedilol 12.5 mg twice  daily and start IV Lasix 40mg  daily and reasses in AM  -Monitor for hypotension   5.  History of [name] s/p CABG 2005: -Denies anginal symptoms -Continue zetia, will restart carvedilol   For questions or updates, please contact [place] Please consult [email or web address] for contact info under Cardiology/STEMI.  SignedKathyrn Drown NP-C [facility] Pager: [phone] 05/09/2018 2:19 PM

## 2018-05-09 NOTE — Progress Notes (Addendum)
Subjective: 5 Days Post-Op Procedure(s) (LRB): LEFT TOTAL HIP ARTHROPLASTY (Left) Patient reports pain as 3 on 0-10 scale.doing much better this morning.He is up in a chair and states that he feels better.HBG 7.8 and it has been around 8.3 on the last two occasions. His vitals are stable so I will wait on another transfusion. Cardiology consult called since the Hospitalist signed off.His thigh is much softer today. His INR has decreased to 1.4 and it was 2.24 even though his coumadin was [facility] on Friday. I believe that his bleeding was a result of the Lovenox and Coumadin. He was on Coumadin Pre-Op.DR. Hochrein is his cardiologist.The question now is when is it safe enough to start his Anticoagulation.Will repeat his HBG at 4:00 PM today.    Objective: Vital signs in last 24 hours: Temp:  [98.3 F (36.8 C)-99.5 F (37.5 C)] 98.6 F (37 C) (07/29 0400) Pulse Rate:  [55-117] 116 (07/29 0400) Resp:  [13-21] 15 (07/29 0400) BP: (100-145)/(49-92) 124/72 (07/29 0400) SpO2:  [93 %-99 %] 97 % (07/29 0400)  Intake/Output from previous day: 07/28 0701 - 07/29 0700 In: 2908.3 [P.O.:240; I.V.:2668.3] Out: 1802 [[id number]] Intake/Output this shift: No intake/output data recorded.  Recent Labs    05/07/18 0837 05/07/18 1938 05/08/18 0313 05/08/18 1151 05/09/18 0357  HGB 7.7* 9.1* 8.3* 8.3* 7.8*   Recent Labs    05/08/18 0313 05/08/18 1151 05/09/18 0357  WBC 19.1*  --  16.2*  RBC 2.60*  --  2.45*  HCT 24.0* 24.0* 22.5*  PLT 159  --  192   Recent Labs    05/07/18 0149 05/08/18 0313  NA 136 135  K 4.3 4.4  CL 104 104  CO2 26 24  BUN 23 31*  CREATININE 1.42* 1.17  GLUCOSE 119* 109*  CALCIUM 7.6* 7.9*   Recent Labs    05/08/18 0313 05/09/18 0357  INR 2.24 1.14    Dorsiflexion/Plantar flexion intact No cellulitis present  His HBg is stabilizing.  Assessment/Plan: 5 Days Post-Op Procedure(s) (LRB): LEFT TOTAL HIP ARTHROPLASTY (Left) Up with therapy.Social worker  Consult is in.    [name] 05/09/2018, 7:24 AM

## 2018-05-10 DIAGNOSIS — I5043 Acute on chronic combined systolic (congestive) and diastolic (congestive) heart failure: Secondary | ICD-10-CM

## 2018-05-10 DIAGNOSIS — I509 Heart failure, unspecified: Secondary | ICD-10-CM

## 2018-05-10 LAB — HEMOGLOBIN AND HEMATOCRIT, BLOOD
HEMATOCRIT: 30.8 % — AB (ref 39.0–52.0)
Hemoglobin: 10.6 g/dL — ABNORMAL LOW (ref 13.0–17.0)

## 2018-05-10 LAB — BASIC METABOLIC PANEL
ANION GAP: 8 (ref 5–15)
BUN: 24 mg/dL — ABNORMAL HIGH (ref 8–23)
CO2: 28 mmol/L (ref 22–32)
Calcium: 8.2 mg/dL — ABNORMAL LOW (ref 8.9–10.3)
Chloride: 103 mmol/L (ref 98–111)
Creatinine, Ser: 0.93 mg/dL (ref 0.61–1.24)
GFR calc non Af Amer: >60 mL/min (ref >60)
Glucose, Bld: 100 mg/dL — ABNORMAL HIGH (ref 70–99)
POTASSIUM: 3.5 mmol/L (ref 3.5–5.1)
Sodium: 139 mmol/L (ref 135–145)

## 2018-05-10 LAB — CBC
HEMATOCRIT: 23.1 % — AB (ref 39.0–52.0)
Hemoglobin: 7.7 g/dL — ABNORMAL LOW (ref 13.0–17.0)
MCH: 30.8 pg (ref 26.0–34.0)
MCHC: 33.3 g/dL (ref 30.0–36.0)
MCV: 92.4 fL (ref 78.0–100.0)
PLATELETS: 222 10*3/uL (ref 150–400)
RBC: 2.5 MIL/uL — AB (ref 4.22–5.81)
RDW: 16.9 % — AB (ref 11.5–15.5)
WBC: 13 10*3/uL — AB (ref 4.0–10.5)

## 2018-05-10 LAB — PROTIME-INR
INR: 1.16
Prothrombin Time: 14.7 seconds (ref 11.4–15.2)

## 2018-05-10 LAB — PREPARE RBC (CROSSMATCH)

## 2018-05-10 MED ORDER — POTASSIUM CHLORIDE CRYS ER 20 MEQ PO TBCR
20.0000 meq | EXTENDED_RELEASE_TABLET | Freq: Once | ORAL | Status: AC
  Administered 2018-05-10: 20 meq via ORAL
  Filled 2018-05-10: qty 1

## 2018-05-10 MED ORDER — FUROSEMIDE 10 MG/ML IJ SOLN
40.0000 mg | Freq: Two times a day (BID) | INTRAMUSCULAR | Status: DC
  Administered 2018-05-10 – 2018-05-12 (×4): 40 mg via INTRAVENOUS
  Filled 2018-05-10 (×4): qty 4

## 2018-05-10 MED ORDER — SODIUM CHLORIDE 0.9% IV SOLUTION
Freq: Once | INTRAVENOUS | Status: AC
  Administered 2018-05-10: 08:00:00 via INTRAVENOUS

## 2018-05-10 MED ORDER — FUROSEMIDE 10 MG/ML IJ SOLN
20.0000 mg | Freq: Once | INTRAMUSCULAR | Status: AC
  Administered 2018-05-10: 20 mg via INTRAVENOUS
  Filled 2018-05-10: qty 2

## 2018-05-10 MED ORDER — METOPROLOL TARTRATE 25 MG PO TABS
50.0000 mg | ORAL_TABLET | Freq: Two times a day (BID) | ORAL | Status: DC
  Administered 2018-05-10: 50 mg via ORAL
  Filled 2018-05-10: qty 2

## 2018-05-10 MED ORDER — CARVEDILOL 12.5 MG PO TABS: 25.0000 mg | ORAL_TABLET | Freq: Two times a day (BID) | ORAL | Status: DC

## 2018-05-10 NOTE — Progress Notes (Signed)
OT Cancellation Note  Patient Details Name: [name] MRN: [id number] DOB: 1932-06-26   Cancelled Treatment:    Reason Eval/Treat Not Completed: Medical issues which prohibited therapy.  Noted pt will receive 2 more units of PRBCs today. Will check back tomorrow.  Andrej Spagnoli 05/10/2018, 9:56 AM  Lesle Chris, OTR/L [phone] 05/10/2018

## 2018-05-10 NOTE — Progress Notes (Signed)
Physical Therapy Treatment Patient Details Name: [name] MRN: [id number] DOB: 18-May-1932 Today's Date: 05/10/2018    History of Present Illness 84 yo male s/p L THA-posterior approach. Rapid response 7/27 for A fib. Transferred to ICU. PMH including R THA, R TKA, cardiac bypass surgery in 2005.     PT Comments    Pt assisted OOB and over to recliner.  Pt's BP and HR improved today so progressed to ambulating with recliner following for safety.  Continue to recommend SNF upon d/c as pt will likely need more physical assist upon d/c and spouse appears unable to provide current level of assist.  BP: supine 110/55 mmHg, sitting 101/81 mmHg, after transfer to recliner: 139/71 mmHg and HR 112bpm   Follow Up Recommendations  Follow surgeon's recommendation for DC plan and follow-up therapies;SNF     Equipment Recommendations  3in1 (PT)    Recommendations for Other Services       Precautions / Restrictions Precautions Precautions: Posterior Hip;Fall Precaution Comments: pt verbalized 2/3 posterior hip precautions; diffuse hematoma over left buttocks Restrictions LLE Weight Bearing: Weight bearing as tolerated    Mobility  Bed Mobility Overal bed mobility: Needs Assistance Bed Mobility: Supine to Sit     Supine to sit: Min assist     General bed mobility comments: assist for L LE, verbal cues for maintaining precautions, HR under 120 bpm  Transfers Overall transfer level: Needs assistance Equipment used: Rolling walker (2 wheeled) Transfers: Sit to/from Stand Sit to Stand: Mod assist;+2 physical assistance;+2 safety/equipment         General transfer comment: Assist to rise, stabilize, control descent, position L LE. Increased time. verbal cues for safety, technique, hand/LE placement; posterior LOB upon standing from recliner also requiring cues to correct and assist to steady  Ambulation/Gait Ambulation/Gait assistance: +2 physical assistance;+2  safety/equipment;Min assist Gait Distance (Feet): 30 Feet Assistive device: Rolling walker (2 wheeled) Gait Pattern/deviations: Step-to pattern;Trunk flexed;Decreased stance time - left;Antalgic Gait velocity: decreased    General Gait Details: verbal cues for maintaining precautions, step length, RW positioning; mild dizziness which did not worsen, recliner following for safety, distance to tolerance   Stairs             Wheelchair Mobility    Modified Rankin (Stroke Patients Only)       Balance         Postural control: Posterior lean Standing balance support: Bilateral upper extremity supported Standing balance-Leahy Scale: Poor                              Cognition Arousal/Alertness: Awake/alert Behavior During Therapy: WFL for tasks assessed/performed Overall Cognitive Status: Within Functional Limits for tasks assessed                                        Exercises      General Comments        Pertinent Vitals/Pain Pain Assessment: 0-10 Pain Score: 3  Pain Location: L hip  Pain Descriptors / Indicators: Sore Pain Intervention(s): Limited activity within patient's tolerance;Repositioned;Monitored during session;Premedicated before session;Ice applied    Home Living                      Prior Function            PT Goals (current goals can  now be found in the care plan section) Acute Rehab PT Goals PT Goal Formulation: With patient/family Time For Goal Achievement: 05/17/18 Potential to Achieve Goals: Good Progress towards PT goals: Progressing toward goals    Frequency    7X/week      PT Plan Current plan remains appropriate    Co-evaluation              AM-PAC PT "6 Clicks" Daily Activity  Outcome Measure  Difficulty turning over in bed (including adjusting bedclothes, sheets and blankets)?: Unable Difficulty moving from lying on back to sitting on the side of the bed? :  Unable Difficulty sitting down on and standing up from a chair with arms (e.g., wheelchair, bedside commode, etc,.)?: Unable Help needed moving to and from a bed to chair (including a wheelchair)?: A Lot Help needed walking in hospital room?: A Lot Help needed climbing 3-5 steps with a railing? : Total 6 Click Score: 8    End of Session Equipment Utilized During Treatment: Gait belt Activity Tolerance: Patient tolerated treatment well Patient left: in chair;with call bell/phone within reach Nurse Communication: Mobility status(observed ambulation) PT Visit Diagnosis: Difficulty in walking, not elsewhere classified (R26.2);Unsteadiness on feet (R26.81)     Time: 8979-1504 PT Time Calculation (min) (ACUTE ONLY): 28 min  Charges:  $Gait Training: 8-22 mins $Therapeutic Activity: 8-22 mins                    Carmelia Bake, PT, DPT 05/10/2018 Pager: [phone]  York Ram E 05/10/2018, 12:36 PM

## 2018-05-10 NOTE — Progress Notes (Signed)
Progress Note  Patient Name: [name] Date of Encounter: 05/10/2018  Primary Cardiologist: Dr. Percival Spanish, MD   Subjective   Pt feeling better this AM. Feels less "bloated". Denies chest pain or palpitations.   Inpatient Medications    Scheduled Meds: . sodium chloride   Intravenous Once  . sodium chloride   Intravenous Once  . carvedilol  12.5 mg Oral BID WC  . diltiazem  120 mg Oral Daily  . docusate sodium  100 mg Oral BID  . ezetimibe  10 mg Oral Daily  . ferrous sulfate  325 mg Oral TID PC  . finasteride  5 mg Oral Daily  . furosemide  20 mg Intravenous Once  . furosemide  40 mg Intravenous Daily  . pantoprazole  40 mg Oral Daily  . Polyethyl Glycol-Propyl Glycol  1 drop Both Eyes QHS   Continuous Infusions:  PRN Meds: acetaminophen, alum & mag hydroxide-simeth, bisacodyl, bisacodyl, calcium carbonate, fluticasone, HYDROmorphone (DILAUDID) injection, magnesium citrate, menthol-cetylpyridinium **OR** phenol, methocarbamol, metoCLOPramide **OR** metoCLOPramide (REGLAN) injection, naloxone, ondansetron **OR** ondansetron (ZOFRAN) IV, oxyCODONE, oxyCODONE, polyethylene glycol, sodium chloride, sodium phosphate   Vital Signs    Vitals:   05/10/18 0300 05/10/18 0400 05/10/18 0500 05/10/18 0600  BP:  (!) 131/51  (!) 117/47  Pulse: (!) 107 (!) 105 (!) 116 100  Resp: 17 15 14 17   Temp:  99 F (37.2 C)    TempSrc:  Oral    SpO2: 95% 95% 95% 94%  Weight:      Height:        Intake/Output Summary (Last 24 hours) at 05/10/2018 0739 Last data filed at 05/10/2018 0615 Gross per 24 hour  Intake 393 ml  Output 1375 ml  Net -982 ml   Filed Weights   05/04/18 0624  Weight: 190 lb (86.2 kg)   Physical Exam   General: Elderly, NAD Skin: Warm, dry, intact  Head: Normocephalic, atraumatic, clear, moist mucus membranes. Neck: Negative for carotid bruits. No JVD Lungs:Clear to ausculation bilaterally. No wheezes, rales, or rhonchi. Breathing is  unlabored. Cardiovascular: Irregularly irregular with S1 S2. No murmurs, rubs or gallops Abdomen: Soft, non-tender, non-distended with normoactive bowel sounds. No obvious abdominal masses. MSK: Strength and tone appear normal for age. 5/5 in all extremities Extremities: L>R LE edema. L 2+, R 1+. No clubbing or cyanosis. DP/PT pulses 1+ bilaterally Neuro: Alert and oriented. No focal deficits. No facial asymmetry. MAE spontaneously. Psych: Responds to questions appropriately with normal affect.    Labs    Chemistry Recent Labs  Lab 05/06/18 0508 05/07/18 0149 05/08/18 0313  NA 140 136 135  K 3.9 4.3 4.4  CL 107 104 104  CO2 28 26 24   GLUCOSE 114* 119* 109*  BUN 15 23 31*  CREATININE 0.96 1.42* 1.17  CALCIUM 8.6* 7.6* 7.9*  GFRNONAA >60 44* 56*  GFRAA >60 51* >60  ANIONGAP 5 6 7      Hematology Recent Labs  Lab 05/08/18 0313  05/09/18 0357 05/09/18 1642 05/10/18 0306  WBC 19.1*  --  16.2*  --  13.0*  RBC 2.60*  --  2.45*  --  2.50*  HGB 8.3*   < > 7.8* 7.2* 7.7*  HCT 24.0*   < > 22.5* 20.8* 23.1*  MCV 92.3  --  91.8  --  92.4  MCH 31.9  --  31.8  --  30.8  MCHC 34.6  --  34.7  --  33.3  RDW 17.6*  --  16.8*  --  16.9*  PLT 159  --  192  --  222   < > = values in this interval not displayed.    Cardiac Enzymes Recent Labs  Lab 05/07/18 0149 05/07/18 0837 05/07/18 1938  TROPONINI <0.03 <0.03 <0.03   No results for input(s): TROPIPOC in the last 168 hours.   BNPNo results for input(s): BNP, PROBNP in the last 168 hours.   DDimer No results for input(s): DDIMER in the last 168 hours.   Radiology    No results found.  Telemetry    05/10/18 Atrial fibrillation HR 105- Personally Reviewed  ECG    No new tracings as of 05/10/18 - Personally Reviewed  Cardiac Studies   ECHO: 04/05/2017: Study Conclusions  - Left ventricle: The cavity size was normal. Wall thickness was increased in a pattern of mild LVH. Systolic function was mildly to  moderately reduced. The estimated ejection fraction was in the range of 40% to 45%. Diffuse hypokinesis. The study is not technically sufficient to allow evaluation of LV diastolic function. - Aortic valve: Mildly calcified annulus. Trileaflet; mildly thickened leaflets. Valve area (VTI): 2.4 cm^2. Valve area (Vmax): 2.35 cm^2. Valve area (Vmean): 2.46 cm^2. - Mitral valve: Mildly calcified annulus. Mildly thickened leaflets . There was mild regurgitation. - Left atrium: The atrium was severely dilated. - Right ventricle: The cavity size was mildly dilated. - Right atrium: The atrium was severely dilated. - Technically adequate study.  Lexiscan Myoview stress test 04/05/2017:  There was no ST segment deviation noted during stress.  Findings consistent with prior inferior/inferolateral myocardial infarction. There is no current ischemia.  This is an intermediate risk study. Risk based on decreased ejection fraction, there is no current myocardium at jeopardy. Recommend correlating LVEF with echo  The left ventricular ejection fraction is mildly decreased (45-54%).  Patient Profile     84 y.o. male with a hx of persistent atrial fibrillation in chronic Coumadin therapy, [name] s/p CABG 2005, cardiomyopathy with LVEF of 40-45% per echo 03/2017, hypertension, hyperlipidemia and carotid stenosis who is being seen today for anticoagulation recommendations and suspected CHF exacerbation at the request of Dr. Emiliano Dyer.  Assessment & Plan    1.  Persistent atrial fibrillation: -Remains in AF with sub-optimal rate control. HR in the low 100's  -INR today, 1.16 -Continue diltiazem 120 mg daily -Will increase Carvedilol to 25 mg twice daily for greater rate control -Will hold all anticoagulation for at least a week, then can consider resuming Coumadin without Lovenox bridge if Hb stable -CHA2DS2VASc =4  2.  Acute blood loss anemia secondary to left hip postoperative bleeding: -Hb  today 7.7>>>receiving 2 additional units of PRBC's with 20mg  IV Lasix in between  -See plan for anticoagulation resumption above -Will need to keep Hb >8.0  3.  Acute on chronic systolic congestive heart failure: -Prior echocardiogram 04/05/2017 with LV EF of 40 to 45% with diffuse hypokinesis -Weight, noted to be 193lb last office visit on 06/02/2017>> 190lb on admission 05/04/2018 -I&O, net +4.5 L since admission, 24-hour total output 1.1L -Improved, but remains fluid volume overloaded on exam, likely in the setting of fluid volume resuscitation secondary to hypotension, recent surgical intervention and multiple transfusions  -Will increase IV Lasix to 40mg  twice daily given that he remains markedly overloaded. He will receive IV Lasix 20mg  between units  -85meq Kdur for borderline K of 3.5 today  -Strict I&O and daily weights  4.  Hypotension: -Resolved, likely secondary to acute blood loss  as outlined above -148/75>137/78>117/47 -Will increase carvedilol to 25mg  twice daily given persistently elevated HR and adequate BP -IV Lasix 40mg  twice daily given positive response overnight -Monitor for hypotension   5.  History of [name] s/p CABG 2005: -Denies anginal symptoms -Continue zetia, will restart carvedilol Signed, Kathyrn Drown NP-C [facility] Pager: [phone] 05/10/2018, 7:39 AM     For questions or updates, please contact   Please consult [email or web address] for contact info under Cardiology/STEMI.

## 2018-05-10 NOTE — Progress Notes (Signed)
Subjective: 6 Days Post-Op Procedure(s) (LRB): LEFT TOTAL HIP ARTHROPLASTY (Left) Patient reports pain as 2 on 0-10 scale.  See my note below.He has good function in his foot.His wound looks fineand his dressing was changed.  Objective: Vital signs in last 24 hours: Temp:  [98.4 F (36.9 C)-99.1 F (37.3 C)] 99 F (37.2 C) (07/30 0400) Pulse Rate:  [100-126] 100 (07/30 0600) Resp:  [14-28] 17 (07/30 0600) BP: (106-135)/(45-80) 117/47 (07/30 0600) SpO2:  [93 %-98 %] 94 % (07/30 0600)  Intake/Output from previous day: 07/29 0701 - 07/30 0700 In: 393 [I.V.:0.3; Blood:392.7] Out: 1525 [Urine:1525] Intake/Output this shift: No intake/output data recorded.  Recent Labs    05/08/18 0313 05/08/18 1151 05/09/18 0357 05/09/18 1642 05/10/18 0306  HGB 8.3* 8.3* 7.8* 7.2* 7.7*   Recent Labs    05/09/18 0357 05/09/18 1642 05/10/18 0306  WBC 16.2*  --  13.0*  RBC 2.45*  --  2.50*  HCT 22.5* 20.8* 23.1*  PLT 192  --  222   Recent Labs    05/08/18 0313  NA 135  K 4.4  CL 104  CO2 24  BUN 31*  CREATININE 1.17  GLUCOSE 109*  CALCIUM 7.9*   Recent Labs    05/09/18 0357 05/10/18 0306  INR 1.14 1.16    Doing much better this morning. He has a diffuse hematoma over his buttocks. Agree with Cardiology,will transfuse with 2-additional units of Packed RBC. And give lasix in between,    Assessment/Plan: 6 Days Post-Op Procedure(s) (LRB): LEFT TOTAL HIP ARTHROPLASTY (Left) Up with therapy.Willtransfuse with 2-more units of packed RBC    [name] 05/10/2018, 7:24 AM

## 2018-05-11 LAB — TYPE AND SCREEN
ABO/RH(D): B POS
ANTIBODY SCREEN: NEGATIVE
UNIT DIVISION: 0
Unit division: 0
Unit division: 0
Unit division: 0
Unit division: 0

## 2018-05-11 LAB — BPAM RBC
BLOOD PRODUCT EXPIRATION DATE: 201908162359
BLOOD PRODUCT EXPIRATION DATE: 201908162359
BLOOD PRODUCT EXPIRATION DATE: 201908222359
Blood Product Expiration Date: 201908092359
Blood Product Expiration Date: 201908092359
ISSUE DATE / TIME: 201907271538
ISSUE DATE / TIME: 201907300804
ISSUE DATE / TIME: 201907301252
ISSUE DATE / TIME: 201907271103
ISSUE DATE / TIME: 201907292107
UNIT TYPE AND RH: 7300
Unit Type and Rh: 7300
Unit Type and Rh: 7300
Unit Type and Rh: 7300
Unit Type and Rh: 7300

## 2018-05-11 LAB — PROTIME-INR
INR: 1.12
INR: 1.14
PROTHROMBIN TIME: 14.5 s (ref 11.4–15.2)
Prothrombin Time: 14.3 seconds (ref 11.4–15.2)

## 2018-05-11 LAB — CBC
HCT: 31.4 % — ABNORMAL LOW (ref 39.0–52.0)
Hemoglobin: 10.8 g/dL — ABNORMAL LOW (ref 13.0–17.0)
MCH: 31.3 pg (ref 26.0–34.0)
MCHC: 34.4 g/dL (ref 30.0–36.0)
MCV: 91 fL (ref 78.0–100.0)
PLATELETS: 310 10*3/uL (ref 150–400)
RBC: 3.45 MIL/uL — AB (ref 4.22–5.81)
RDW: 16.9 % — AB (ref 11.5–15.5)
WBC: 14.7 10*3/uL — AB (ref 4.0–10.5)

## 2018-05-11 LAB — HEMOGLOBIN AND HEMATOCRIT, BLOOD
HEMATOCRIT: 32.5 % — AB (ref 39.0–52.0)
HEMOGLOBIN: 11 g/dL — AB (ref 13.0–17.0)

## 2018-05-11 MED ORDER — METOPROLOL TARTRATE 25 MG PO TABS
50.0000 mg | ORAL_TABLET | Freq: Three times a day (TID) | ORAL | Status: DC
  Administered 2018-05-11 (×2): 50 mg via ORAL
  Filled 2018-05-11: qty 2

## 2018-05-11 MED ORDER — METOPROLOL TARTRATE 25 MG PO TABS
100.0000 mg | ORAL_TABLET | Freq: Three times a day (TID) | ORAL | Status: AC
  Administered 2018-05-11: 100 mg via ORAL
  Filled 2018-05-11 (×2): qty 4

## 2018-05-11 MED ORDER — METOPROLOL SUCCINATE ER 25 MG PO TB24
200.0000 mg | ORAL_TABLET | Freq: Every day | ORAL | Status: DC
  Administered 2018-05-12 – 2018-05-13 (×2): 200 mg via ORAL
  Filled 2018-05-11 (×2): qty 8

## 2018-05-11 NOTE — Progress Notes (Signed)
OT Cancellation Note  Patient Details Name: [name] MRN: [id number] DOB: 1932-04-12   Cancelled Treatment:    Reason Eval/Treat Not Completed: Other (comment).  Plan is for home tomorrow.  Daughter in room with pt and she will also be helping.  Pt did not feel he needed to practice toilet or shower transfer:  States he knows what to do, and I had given him a handout earlier in his stay.  Family will assist with adls.  Reminded him of THPs. Will sign off.  Hildur Bayer 05/11/2018, 1:47 PM  Lesle Chris, OTR/L [phone] 05/11/2018

## 2018-05-11 NOTE — Progress Notes (Signed)
Dr. Grafton Folk updated regarding new lab results H&H = 11.0/32.5. Physician reported patient will be discharged tomorrow as condition dictates. Requested Cardiology's input regarding what medications they would prefer for the patient at discharge.

## 2018-05-11 NOTE — Progress Notes (Signed)
Progress Note  Patient Name: [name] Date of Encounter: 05/11/2018  Primary Cardiologist: Minus Breeding, MD   Subjective   Feeling well, slept on the bed last night, no orthopnea or PND  Inpatient Medications    Scheduled Meds: . sodium chloride   Intravenous Once  . diltiazem  120 mg Oral Daily  . docusate sodium  100 mg Oral BID  . ezetimibe  10 mg Oral Daily  . ferrous sulfate  325 mg Oral TID PC  . finasteride  5 mg Oral Daily  . furosemide  40 mg Intravenous BID  . metoprolol tartrate  50 mg Oral TID  . pantoprazole  40 mg Oral Daily  . Polyethyl Glycol-Propyl Glycol  1 drop Both Eyes QHS   Continuous Infusions:  PRN Meds: acetaminophen, alum & mag hydroxide-simeth, bisacodyl, bisacodyl, calcium carbonate, fluticasone, HYDROmorphone (DILAUDID) injection, magnesium citrate, menthol-cetylpyridinium **OR** phenol, methocarbamol, metoCLOPramide **OR** metoCLOPramide (REGLAN) injection, naloxone, ondansetron **OR** ondansetron (ZOFRAN) IV, oxyCODONE, oxyCODONE, polyethylene glycol, sodium chloride, sodium phosphate   Vital Signs    Vitals:   05/11/18 0200 05/11/18 0300 05/11/18 0400 05/11/18 0500  BP:      Pulse: 95 90 (!) 107 95  Resp: 15 18 12 17   Temp:   98.5 F (36.9 C)   TempSrc:   Oral   SpO2: 94% 94% 98% 95%  Weight:      Height:        Intake/Output Summary (Last 24 hours) at 05/11/2018 0854 Last data filed at 05/11/2018 0630 Gross per 24 hour  Intake 955 ml  Output 3275 ml  Net -2320 ml   Filed Weights   05/04/18 0624 05/10/18 1325  Weight: 190 lb (86.2 kg) 205 lb 0.4 oz (93 kg)    Telemetry    Atrial fibrillation with HR 110-130s - Personally Reviewed  ECG    Atrial fibrillation - Personally Reviewed  Physical Exam   GEN: No acute distress.   Neck: No JVD Cardiac: irregularly irregular, no murmurs, rubs, or gallops.  Respiratory: largely clear, R basilar crackle GI: Soft, nontender, non-distended  MS: No deformity. No edema on  the right, 1+ LLE edema Neuro:  Nonfocal  Psych: Normal affect   Labs    Chemistry Recent Labs  Lab 05/07/18 0149 05/08/18 0313 05/10/18 0306  NA 136 135 139  K 4.3 4.4 3.5  CL 104 104 103  CO2 26 24 28   GLUCOSE 119* 109* 100*  BUN 23 31* 24*  CREATININE 1.42* 1.17 0.93  CALCIUM 7.6* 7.9* 8.2*  GFRNONAA 44* 56* >60  GFRAA 51* >60 >60  ANIONGAP 6 7 8      Hematology Recent Labs  Lab 05/08/18 0313  05/09/18 0357  05/10/18 0306 05/10/18 1700 05/11/18 0747  WBC 19.1*  --  16.2*  --  13.0*  --   --   RBC 2.60*  --  2.45*  --  2.50*  --   --   HGB 8.3*   < > 7.8*   < > 7.7* 10.6* 11.0*  HCT 24.0*   < > 22.5*   < > 23.1* 30.8* 32.5*  MCV 92.3  --  91.8  --  92.4  --   --   MCH 31.9  --  31.8  --  30.8  --   --   MCHC 34.6  --  34.7  --  33.3  --   --   RDW 17.6*  --  16.8*  --  16.9*  --   --  PLT 159  --  192  --  222  --   --    < > = values in this interval not displayed.    Cardiac Enzymes Recent Labs  Lab 05/07/18 0149 05/07/18 0837 05/07/18 1938  TROPONINI <0.03 <0.03 <0.03   No results for input(s): TROPIPOC in the last 168 hours.   BNPNo results for input(s): BNP, PROBNP in the last 168 hours.   DDimer No results for input(s): DDIMER in the last 168 hours.   Radiology    No results found.  Cardiac Studies   Echo 04/05/2017 LV EF: 40% -   45% Study Conclusions  - Left ventricle: The cavity size was normal. Wall thickness was   increased in a pattern of mild LVH. Systolic function was mildly   to moderately reduced. The estimated ejection fraction was in the   range of 40% to 45%. Diffuse hypokinesis. The study is not   technically sufficient to allow evaluation of LV diastolic   function. - Aortic valve: Mildly calcified annulus. Trileaflet; mildly   thickened leaflets. Valve area (VTI): 2.4 cm^2. Valve area   (Vmax): 2.35 cm^2. Valve area (Vmean): 2.46 cm^2. - Mitral valve: Mildly calcified annulus. Mildly thickened leaflets   . There was  mild regurgitation. - Left atrium: The atrium was severely dilated. - Right ventricle: The cavity size was mildly dilated. - Right atrium: The atrium was severely dilated. - Technically adequate study.    Myoview 04/05/2017  There was no ST segment deviation noted during stress.  Findings consistent with prior inferior/inferolateral myocardial infarction. There is no current ischemia.  This is an intermediate risk study. Risk based on decreased ejection fraction, there is no current myocardium at jeopardy. Recommend correlating LVEF with echo  The left ventricular ejection fraction is mildly decreased (45-54%).  Patient Profile     84 y.o. male with PMH of persistent atrial fibrillation on coumadin, [name] s/p CABG 2005, ICM with EF 40-45%, HTN, HLD and carotid artery disease who presented for L total hip arthroplasty by Dr. Gladstone Lighter on 7/24. Post operative state complicated by orthostatic hypotension and atrial fibrillation with RVR. Hypotension responded to fluid hydration. Anemia worsened, CT of hip 05/07/2018 showed hematoma associated with surgery. Patient underwent blood transfusion. Cardiology consulted for anticoagulation recommendation and possible CHF therapy assistance.   Assessment & Plan    1. Atrial fibrillation with RVR  - HR remain 110-130s during the days while sitting in the chair, HR 90s last night during sleep.  - increase metoprolol tartrate to 50mg  TID. Consolidate to metoprolol succinate tomorrow. Continue diltiazem. May still consider digoxin if HR cannot be adequately controlled esp given his h/o heart failure, although digoxin will have interaction with diltiazem increasing the possibility of dig toxicity.   2. Acute on chronic systolic HF  - some volume overload after IVF hydration for hypotension. Underwent diuresis.   - still mildly volume overloaded, stil +2L since admission. Continue IV diuresis, change back to home PO lasix tomorrow.   - likely will have some  mild swelling in the left lower extremity given recent surgery.   3. Acute blood loss anemia secondary to left hip leeding  - plan to hold coumadin for at least 1 week before restarting without Lovenox bridge.   4. [name] s/p CABG 2005: negative myoview 03/2017  5. Hypotension: resolved, likely related to blood loss       For questions or updates, please contact [place] Please consult [email or web address] for contact info under  Cardiology/STEMI.      Hilbert Corrigan, PA  05/11/2018, 8:54 AM

## 2018-05-11 NOTE — Progress Notes (Signed)
Physical Therapy Treatment Patient Details Name: [name] MRN: [id number] DOB: July 10, 1932 Today's Date: 05/11/2018    History of Present Illness 84 yo male s/p L THA-posterior approach. Rapid response 7/27 for A fib. Transferred to ICU. PMH including R THA, R TKA, cardiac bypass surgery in 2005.     PT Comments    Pt with motivation to ambulate upon arrival to room due to desire to go home. Pt with elevated HR today during ambulation, reaching HR 153 after walking approximately 20 feet. Pt required 4 minute seated rest break to recover HR to 118-120. Nursing notified of elevated HR during and directly after ambulation, and she states that they are reassessing his cardiac medications at this time. Will continue to follow for stair training and exercises prior to d/c.   BP prior to PT arrival: 127/52 BP after ambulation: 155/76   Follow Up Recommendations  Follow surgeon's recommendation for DC plan and follow-up therapies;Supervision for mobility/OOB(home health as per pt's request )     Equipment Recommendations  3in1 (PT)    Recommendations for Other Services       Precautions / Restrictions Precautions Precautions: Posterior Hip Precaution Comments: pt verbalized 3/3 posterior hip precautions, hematoma over L gluteal region  Restrictions Weight Bearing Restrictions: No LLE Weight Bearing: Weight bearing as tolerated    Mobility  Bed Mobility Overal bed mobility: Needs Assistance Bed Mobility: Supine to Sit;Sit to Supine     Supine to sit: Min assist Sit to supine: Min assist   General bed mobility comments: assist for L LE, verbal cues for maintaining precautions, HR to mid 120s upon sitting up. Wife provided min assist to pt's LLE going from sit to supine.   Transfers Overall transfer level: Needs assistance Equipment used: Rolling walker (2 wheeled) Transfers: Sit to/from Stand Sit to Stand: Mod assist         General transfer comment: Assist for power  up, placement of LLE, and steadying upon standing. Increased time. Verbal cuing for hand placement, LE placement.   Ambulation/Gait Ambulation/Gait assistance: +2 safety/equipment;Min assist Gait Distance (Feet): 44 Feet(2x22 ft ) Assistive device: Rolling walker (2 wheeled) Gait Pattern/deviations: Step-to pattern;Trunk flexed;Decreased stance time - left;Antalgic Gait velocity: decreased    General Gait Details: Min assist for steadying and guiding movmeent. verbal cues for placement in RW, ER of L hip from a position favoring IR. Recliner follow for safety.    Stairs             Wheelchair Mobility    Modified Rankin (Stroke Patients Only)       Balance Overall balance assessment: Needs assistance Sitting-balance support: No upper extremity supported Sitting balance-Leahy Scale: Fair   Postural control: Posterior lean Standing balance support: Bilateral upper extremity supported;During functional activity Standing balance-Leahy Scale: Poor                              Cognition Arousal/Alertness: Awake/alert Behavior During Therapy: WFL for tasks assessed/performed Overall Cognitive Status: Within Functional Limits for tasks assessed                                        Exercises      General Comments        Pertinent Vitals/Pain Pain Assessment: No/denies pain    Home Living  Prior Function            PT Goals (current goals can now be found in the care plan section) Acute Rehab PT Goals PT Goal Formulation: With patient/family Time For Goal Achievement: 05/17/18 Potential to Achieve Goals: Good Progress towards PT goals: Progressing toward goals    Frequency    7X/week      PT Plan Current plan remains appropriate    Co-evaluation              AM-PAC PT "6 Clicks" Daily Activity  Outcome Measure  Difficulty turning over in bed (including adjusting bedclothes, sheets  and blankets)?: Unable Difficulty moving from lying on back to sitting on the side of the bed? : Unable Difficulty sitting down on and standing up from a chair with arms (e.g., wheelchair, bedside commode, etc,.)?: Unable Help needed moving to and from a bed to chair (including a wheelchair)?: A Lot Help needed walking in hospital room?: A Lot Help needed climbing 3-5 steps with a railing? : Total 6 Click Score: 8    End of Session Equipment Utilized During Treatment: Gait belt Activity Tolerance: Patient tolerated treatment well(pt limited by HR elevation during ambulation ) Patient left: with call bell/phone within reach;with bed alarm set;in bed;with family/visitor present Nurse Communication: Mobility status PT Visit Diagnosis: Unsteadiness on feet (R26.81);Difficulty in walking, not elsewhere classified (R26.2)     Time: 3419-6222 PT Time Calculation (min) (ACUTE ONLY): 26 min  Charges:  $Gait Training: 23-37 mins                     [name], PT, DPT  Pager # [phone]     [name] 05/11/2018, 2:00 PM

## 2018-05-11 NOTE — Progress Notes (Signed)
Subjective: 7 Days Post-Op Procedure(s) (LRB): LEFT TOTAL HIP ARTHROPLASTY (Left) Patient reports pain as 1 on 0-10 scale.No pain this morning.He has ambulated and is comfortable in a chair at this time, I will get a HBG and Coag studies now. His vital signs are stable.he nolonger has any pain in his thigh .    Objective: Vital signs in last 24 hours: Temp:  [97.8 F (36.6 C)-99.2 F (37.3 C)] 98.5 F (36.9 C) (07/31 0400) Pulse Rate:  [83-119] 95 (07/31 0500) Resp:  [12-23] 17 (07/31 0500) BP: (108-148)/(46-87) 146/87 (07/30 2200) SpO2:  [94 %-98 %] 95 % (07/31 0500) Weight:  [93 kg (205 lb 0.4 oz)] 93 kg (205 lb 0.4 oz) (07/30 1325)  Intake/Output from previous day: 07/30 0701 - 07/31 0700 In: 1195 [P.O.:780] Out: 3275 [Urine:3275] Intake/Output this shift: No intake/output data recorded.  Recent Labs    05/08/18 1151 05/09/18 0357 05/09/18 1642 05/10/18 0306 05/10/18 1700  HGB 8.3* 7.8* 7.2* 7.7* 10.6*   Recent Labs    05/09/18 0357  05/10/18 0306 05/10/18 1700  WBC 16.2*  --  13.0*  --   RBC 2.45*  --  2.50*  --   HCT 22.5*   < > 23.1* 30.8*  PLT 192  --  222  --    < > = values in this interval not displayed.   Recent Labs    05/10/18 0306  NA 139  K 3.5  CL 103  CO2 28  BUN 24*  CREATININE 0.93  GLUCOSE 100*  CALCIUM 8.2*   Recent Labs    05/10/18 0306 05/11/18 0306  INR 1.16 1.12    Dorsiflexion/Plantar flexion intact  Doing much better.  Assessment/Plan: 7 Days Post-Op Procedure(s) (LRB): LEFT TOTAL HIP ARTHROPLASTY (Left) Up with therapy    [name] 05/11/2018, 7:11 AM

## 2018-05-12 LAB — CBC WITH DIFFERENTIAL/PLATELET
Basophils Absolute: 0 10*3/uL (ref 0.0–0.1)
Basophils Relative: 0 %
EOS PCT: 3 %
Eosinophils Absolute: 0.4 10*3/uL (ref 0.0–0.7)
HCT: 32.4 % — ABNORMAL LOW (ref 39.0–52.0)
Hemoglobin: 10.9 g/dL — ABNORMAL LOW (ref 13.0–17.0)
LYMPHS ABS: 5.2 10*3/uL — AB (ref 0.7–4.0)
Lymphocytes Relative: 32 %
MCH: 31.2 pg (ref 26.0–34.0)
MCHC: 33.6 g/dL (ref 30.0–36.0)
MCV: 92.8 fL (ref 78.0–100.0)
MONOS PCT: 7 %
Monocytes Absolute: 1.1 10*3/uL — ABNORMAL HIGH (ref 0.1–1.0)
Neutro Abs: 9.4 10*3/uL — ABNORMAL HIGH (ref 1.7–7.7)
Neutrophils Relative %: 58 %
PLATELETS: 348 10*3/uL (ref 150–400)
RBC: 3.49 MIL/uL — ABNORMAL LOW (ref 4.22–5.81)
RDW: 16.7 % — ABNORMAL HIGH (ref 11.5–15.5)
WBC: 16.1 10*3/uL — ABNORMAL HIGH (ref 4.0–10.5)

## 2018-05-12 LAB — BASIC METABOLIC PANEL
Anion gap: 10 (ref 5–15)
BUN: 19 mg/dL (ref 8–23)
CHLORIDE: 97 mmol/L — AB (ref 98–111)
CO2: 33 mmol/L — ABNORMAL HIGH (ref 22–32)
Calcium: 8.4 mg/dL — ABNORMAL LOW (ref 8.9–10.3)
Creatinine, Ser: 0.91 mg/dL (ref 0.61–1.24)
GFR calc Af Amer: >60 mL/min (ref >60)
GFR calc non Af Amer: >60 mL/min (ref >60)
Glucose, Bld: 105 mg/dL — ABNORMAL HIGH (ref 70–99)
POTASSIUM: 3.4 mmol/L — AB (ref 3.5–5.1)
Sodium: 140 mmol/L (ref 135–145)

## 2018-05-12 LAB — PROTIME-INR
INR: 1.15
Prothrombin Time: 14.6 seconds (ref 11.4–15.2)

## 2018-05-12 MED ORDER — AMIODARONE LOAD VIA INFUSION
150.0000 mg | Freq: Once | INTRAVENOUS | Status: AC
  Administered 2018-05-12: 150 mg via INTRAVENOUS
  Filled 2018-05-12: qty 83.34

## 2018-05-12 MED ORDER — AMIODARONE HCL IN DEXTROSE 360-4.14 MG/200ML-% IV SOLN
60.0000 mg/h | INTRAVENOUS | Status: DC
  Administered 2018-05-12 (×2): 60 mg/h via INTRAVENOUS
  Filled 2018-05-12: qty 200

## 2018-05-12 MED ORDER — FUROSEMIDE 40 MG PO TABS
40.0000 mg | ORAL_TABLET | Freq: Every day | ORAL | Status: DC
  Administered 2018-05-13: 40 mg via ORAL
  Filled 2018-05-12: qty 1

## 2018-05-12 MED ORDER — AMIODARONE HCL IN DEXTROSE 360-4.14 MG/200ML-% IV SOLN
30.0000 mg/h | INTRAVENOUS | Status: DC
  Administered 2018-05-12 – 2018-05-13 (×2): 30 mg/h via INTRAVENOUS
  Filled 2018-05-12 (×2): qty 200

## 2018-05-12 NOTE — Progress Notes (Signed)
CSW consult- SNF  Per orthopedics physician- patient will d/c home and see him Monday.   Kathrin Greathouse, Marlinda Mike, MSW Clinical Social Worker  [phone] 05/12/2018  11:15 AM

## 2018-05-12 NOTE — Progress Notes (Signed)
Subjective: 8 Days Post-Op Procedure(s) (LRB): LEFT TOTAL HIP ARTHROPLASTY (Left) Patient reports pain as 2 on 0-10 scaleHe is doing fine. HBG is 10.6. Will have Cardioloogy write his meds. Our meds are on the chart. He will see me Monday and have a repeat his HBG. .    Objective: Vital signs in last 24 hours: Temp:  [98.4 F (36.9 C)-99.2 F (37.3 C)] 98.4 F (36.9 C) (07/31 2353) Pulse Rate:  [75-119] 109 (08/01 0600) Resp:  [13-21] 14 (08/01 0600) BP: (101-172)/(50-95) 172/95 (08/01 0600) SpO2:  [91 %-98 %] 95 % (08/01 0600) Weight:  [88.5 kg (195 lb 1.7 oz)] 88.5 kg (195 lb 1.7 oz) (08/01 0203)  Intake/Output from previous day: 07/31 0701 - 08/01 0700 In: -  Out: 2425 [Urine:2425] Intake/Output this shift: No intake/output data recorded.  Recent Labs    05/09/18 1642 05/10/18 0306 05/10/18 1700 05/11/18 0747 05/11/18 1820  HGB 7.2* 7.7* 10.6* 11.0* 10.8*   Recent Labs    05/10/18 0306  05/11/18 0747 05/11/18 1820  WBC 13.0*  --   --  14.7*  RBC 2.50*  --   --  3.45*  HCT 23.1*   < > 32.5* 31.4*  PLT 222  --   --  310   < > = values in this interval not displayed.   Recent Labs    05/10/18 0306 05/12/18 0314  NA 139 140  K 3.5 3.4*  CL 103 97*  CO2 28 33*  BUN 24* 19  CREATININE 0.93 0.91  GLUCOSE 100* 105*  CALCIUM 8.2* 8.4*   Recent Labs    05/11/18 1820 05/12/18 0314  INR 1.14 1.15    Neurologically intact  Doing well.  Assessment/Plan: 8 Days Post-Op Procedure(s) (LRB): LEFT TOTAL HIP ARTHROPLASTY (Left) Will Dc to his home and see him Monday. I will repeat his HBg then.    [name] 05/12/2018, 7:11 AM

## 2018-05-12 NOTE — Progress Notes (Signed)
Progress Note  Patient Name: [name] Date of Encounter: 05/12/2018  Primary Cardiologist: Dr. Percival Spanish, MD  Subjective   Felling well today. Denies SOB, chest pain or palpitations.   Inpatient Medications    Scheduled Meds: . sodium chloride   Intravenous Once  . diltiazem  120 mg Oral Daily  . docusate sodium  100 mg Oral BID  . ezetimibe  10 mg Oral Daily  . ferrous sulfate  325 mg Oral TID PC  . finasteride  5 mg Oral Daily  . furosemide  40 mg Intravenous BID  . metoprolol succinate  200 mg Oral Daily  . pantoprazole  40 mg Oral Daily  . Polyethyl Glycol-Propyl Glycol  1 drop Both Eyes QHS   Continuous Infusions:  PRN Meds: acetaminophen, alum & mag hydroxide-simeth, bisacodyl, bisacodyl, calcium carbonate, fluticasone, HYDROmorphone (DILAUDID) injection, magnesium citrate, menthol-cetylpyridinium **OR** phenol, methocarbamol, metoCLOPramide **OR** metoCLOPramide (REGLAN) injection, naloxone, ondansetron **OR** ondansetron (ZOFRAN) IV, oxyCODONE, oxyCODONE, polyethylene glycol, sodium chloride, sodium phosphate   Vital Signs    Vitals:   05/12/18 0203 05/12/18 0400 05/12/18 0600 05/12/18 0800  BP:  (!) 116/58 (!) 172/95   Pulse:  88 (!) 109   Resp:  15 14   Temp:    98.7 F (37.1 C)  TempSrc:    Oral  SpO2:  95% 95%   Weight: 195 lb 1.7 oz (88.5 kg)     Height:        Intake/Output Summary (Last 24 hours) at 05/12/2018 0826 Last data filed at 05/12/2018 0018 Gross per 24 hour  Intake -  Output 2125 ml  Net -2125 ml   Filed Weights   05/04/18 0624 05/10/18 1325 05/12/18 0203  Weight: 190 lb (86.2 kg) 205 lb 0.4 oz (93 kg) 195 lb 1.7 oz (88.5 kg)   Physical Exam   General: Elderly, NAD Skin: Warm, dry, intact  Head: Normocephalic, atraumatic, clear, moist mucus membranes. Neck: Negative for carotid bruits. No JVD Lungs:Clear to ausculation bilaterally. No wheezes, rales, or rhonchi. Breathing is unlabored. Cardiovascular: Irregularly irregular  with S1 S2. No murmurs, rubs or gallops Abdomen: Soft, non-tender, non-distended with normoactive bowel sounds. No obvious abdominal masses. MSK: Strength and tone appear normal for age. 5/5 in all extremities Extremities: Left LE edema, 2+. No clubbing or cyanosis. DP/PT pulses 1+ bilaterally Neuro: Alert and oriented. No focal deficits. No facial asymmetry. MAE spontaneously. Psych: Responds to questions appropriately with normal affect.    Labs    Chemistry Recent Labs  Lab 05/08/18 0313 05/10/18 0306 05/12/18 0314  NA 135 139 140  K 4.4 3.5 3.4*  CL 104 103 97*  CO2 24 28 33*  GLUCOSE 109* 100* 105*  BUN 31* 24* 19  CREATININE 1.17 0.93 0.91  CALCIUM 7.9* 8.2* 8.4*  GFRNONAA 56* >60 >60  GFRAA >60 >60 >60  ANIONGAP 7 8 10      Hematology Recent Labs  Lab 05/09/18 0357  05/10/18 0306 05/10/18 1700 05/11/18 0747 05/11/18 1820  WBC 16.2*  --  13.0*  --   --  14.7*  RBC 2.45*  --  2.50*  --   --  3.45*  HGB 7.8*   < > 7.7* 10.6* 11.0* 10.8*  HCT 22.5*   < > 23.1* 30.8* 32.5* 31.4*  MCV 91.8  --  92.4  --   --  91.0  MCH 31.8  --  30.8  --   --  31.3  MCHC 34.7  --  33.3  --   --  34.4  RDW 16.8*  --  16.9*  --   --  16.9*  PLT 192  --  222  --   --  310   < > = values in this interval not displayed.    Cardiac Enzymes Recent Labs  Lab 05/07/18 0149 05/07/18 0837 05/07/18 1938  TROPONINI <0.03 <0.03 <0.03   No results for input(s): TROPIPOC in the last 168 hours.   BNPNo results for input(s): BNP, PROBNP in the last 168 hours.   DDimer No results for input(s): DDIMER in the last 168 hours.   Radiology    No results found.  Telemetry    05/12/18 Atrial fibrillation HR 130 (just ambulated) - Personally Reviewed  ECG    No new tracing as of 05/12/18 - Personally Reviewed  Cardiac Studies   ECHO:04/05/2017: Study Conclusions  - Left ventricle: The cavity size was normal. Wall thickness was increased in a pattern of mild LVH. Systolic  function was mildly to moderately reduced. The estimated ejection fraction was in the range of 40% to 45%. Diffuse hypokinesis. The study is not technically sufficient to allow evaluation of LV diastolic function. - Aortic valve: Mildly calcified annulus. Trileaflet; mildly thickened leaflets. Valve area (VTI): 2.4 cm^2. Valve area (Vmax): 2.35 cm^2. Valve area (Vmean): 2.46 cm^2. - Mitral valve: Mildly calcified annulus. Mildly thickened leaflets . There was mild regurgitation. - Left atrium: The atrium was severely dilated. - Right ventricle: The cavity size was mildly dilated. - Right atrium: The atrium was severely dilated. - Technically adequate study.  Lexiscan Myoview stress test 04/05/2017:  There was no ST segment deviation noted during stress.  Findings consistent with prior inferior/inferolateral myocardial infarction. There is no current ischemia.  This is an intermediate risk study. Risk based on decreased ejection fraction, there is no current myocardium at jeopardy. Recommend correlating LVEF with echo  The left ventricular ejection fraction is mildly decreased (45-54%).  Patient Profile     84 y.o. male with a hx of persistentatrial fibrillationin chronic Coumadin therapy, CADs/p CABG 2005, cardiomyopathywith LVEF of 40-45% per echo 03/2017, hypertension, hyperlipidemia and carotid stenosis who is being seen today foranticoagulation recommendations and suspected CHF exacerbationat the request ofDr. Giofree.  Assessment & Plan    1.Persistent atrial fibrillation: -Remains in AF with sub-optimal rate control. HR in the upper 90's throughout the night per tele review however will elevate to the 130's consistently with movement and ambulation  -INR today, 1.15 -Continue diltiazem 120 mg daily??? Digoxin if unable to gain greater control? -Continue Toprol-XL 200 mg daily and continue to evaluate>>>HR better but not at baseline with ambulation. BP  stable  -Hold warfarin until 05/18/18 with no Lovenox bridge per MD  -CHA2DS2VASc =4  2. Acute blood loss anemia secondary to left hip postoperative bleeding: -Hb today 10.8 -See plan for anticoagulation resumption above -Will need to keep Hb >8.0  3. Acute on chronic systolic congestive heart failure: -Prior echocardiogram 04/05/2017 with LV EF of 40 to 45% with diffuse hypokinesis -Weight actually increased from admission weight of 190lbon 05/04/2018? Accuracy of weights?  -I&O, net +12ml L since admission, 24-hour total output 2.4L>>good diuresis -Responding well to diuretics however mildly fluid volume overloaded on exam with LE edema,likely in the setting of fluid volume resuscitation secondary to hypotension, recent surgical intervention and multiple transfusions  -Change IV Lasix to Lasix 40 mg PO daily  -K+ of 3.4 today  -Strict I&O and daily weights  4. Hypotension: -Resolved,likelysecondary to acute blood loss as  outlined above -116/58>101/50>127/65 -Continue Toprol XL 200 mg daily -Change IV Lasix to Lasix 40 mg PO daily   5. History of [name] s/p CABG 2005: -Denies anginal symptoms -Continuezetia  Signed, Kathyrn Drown NP-C [facility] Pager: [phone] 05/12/2018, 8:26 AM     For questions or updates, please contact   Please consult [email or web address] for contact info under Cardiology/STEMI.

## 2018-05-12 NOTE — Progress Notes (Signed)
Physical Therapy Treatment Patient Details Name: [name] MRN: [id number] DOB: 05-07-1932 Today's Date: 05/12/2018    History of Present Illness 84 yo male s/p L THA-posterior approach. Rapid response 7/27 for A fib. Transferred to ICU. PMH including R THA, R TKA, cardiac bypass surgery in 2005.     PT Comments    Pt eager to mobilize however only ambulated 14 feet due to elevated HR.  Pt up to 150 bpm however running 130-140s bpm with rest break in recliner so brought pt back to room in recliner.  Pt able to perform LE exercises in recliner (HR in 110s for exercises).  Pt hopeful for d/c home today however did not progress ambulation or even attempt stairs with tachycardia.   Follow Up Recommendations  Follow surgeon's recommendation for DC plan and follow-up therapies;Supervision for mobility/OOB(recommend SNF, however pt wishes for home)     Equipment Recommendations  Other (comment)(3 in 1 already delivered to pt's room)    Recommendations for Other Services       Precautions / Restrictions Precautions Precautions: Posterior Hip Precaution Comments: pt verbalized 3/3 posterior hip precautions, hematoma over L gluteal region  Restrictions LLE Weight Bearing: Weight bearing as tolerated    Mobility  Bed Mobility Overal bed mobility: Needs Assistance Bed Mobility: Supine to Sit     Supine to sit: Min guard     General bed mobility comments: verbal cues for maintaining precautions and self assist  Transfers Overall transfer level: Needs assistance Equipment used: Rolling walker (2 wheeled) Transfers: Sit to/from Stand Sit to Stand: Min assist         General transfer comment: assist to steady with rise, increased time and effort, cues for hand placement  Ambulation/Gait Ambulation/Gait assistance: +2 safety/equipment;Min guard Gait Distance (Feet): 14 Feet Assistive device: Rolling walker (2 wheeled) Gait Pattern/deviations: Step-to pattern;Trunk  flexed;Decreased stance time - left;Antalgic Gait velocity: decreased    General Gait Details: verbal cues for sequence, RW positioning, and maintaining neutral hip positioning, recliner following, HR elevated to 150 bpm so had pt sit down in recliner, HR remained 130-140s so pt brought back to room in recliner   Stairs             Wheelchair Mobility    Modified Rankin (Stroke Patients Only)       Balance                                            Cognition Arousal/Alertness: Awake/alert Behavior During Therapy: WFL for tasks assessed/performed Overall Cognitive Status: Within Functional Limits for tasks assessed                                        Exercises Total Joint Exercises Ankle Circles/Pumps: AROM;Both;20 reps;Supine;10 reps Quad Sets: AROM;Left;Supine;10 reps Heel Slides: AAROM;Left;Supine;10 reps(within precautions) Hip ABduction/ADduction: 10 reps;Supine;AAROM;Left    General Comments        Pertinent Vitals/Pain Pain Assessment: No/denies pain Pain Intervention(s): Repositioned;Ice applied    Home Living                      Prior Function            PT Goals (current goals can now be found in the care plan section) Progress towards  PT goals: Progressing toward goals    Frequency    7X/week      PT Plan Current plan remains appropriate    Co-evaluation              AM-PAC PT "6 Clicks" Daily Activity  Outcome Measure  Difficulty turning over in bed (including adjusting bedclothes, sheets and blankets)?: A Lot Difficulty moving from lying on back to sitting on the side of the bed? : A Lot Difficulty sitting down on and standing up from a chair with arms (e.g., wheelchair, bedside commode, etc,.)?: Unable Help needed moving to and from a bed to chair (including a wheelchair)?: A Little Help needed walking in hospital room?: A Little Help needed climbing 3-5 steps with a railing?  : A Lot 6 Click Score: 13    End of Session Equipment Utilized During Treatment: Gait belt Activity Tolerance: Treatment limited secondary to medical complications (Comment)(tachycardia) Patient left: with family/visitor present;with call bell/phone within reach;in chair Nurse Communication: Mobility status PT Visit Diagnosis: Unsteadiness on feet (R26.81);Difficulty in walking, not elsewhere classified (R26.2)     Time: 8682-5749 PT Time Calculation (min) (ACUTE ONLY): 26 min  Charges:  $Gait Training: 8-22 mins $Therapeutic Exercise: 8-22 mins                     Carmelia Bake, PT, DPT 05/12/2018 Pager: [phone]  York Ram E 05/12/2018, 12:58 PM

## 2018-05-13 ENCOUNTER — Inpatient Hospital Stay (HOSPITAL_COMMUNITY): Payer: Medicare Other

## 2018-05-13 ENCOUNTER — Telehealth: Payer: Self-pay | Admitting: Adult Health

## 2018-05-13 DIAGNOSIS — D72829 Elevated white blood cell count, unspecified: Secondary | ICD-10-CM

## 2018-05-13 DIAGNOSIS — R05 Cough: Secondary | ICD-10-CM

## 2018-05-13 DIAGNOSIS — R059 Cough, unspecified: Secondary | ICD-10-CM

## 2018-05-13 LAB — URINALYSIS, ROUTINE W REFLEX MICROSCOPIC
Bilirubin Urine: NEGATIVE
Glucose, UA: NEGATIVE mg/dL
Hgb urine dipstick: NEGATIVE
Ketones, ur: NEGATIVE mg/dL
Leukocytes, UA: NEGATIVE
Nitrite: NEGATIVE
Protein, ur: NEGATIVE mg/dL
Specific Gravity, Urine: 1.013 (ref 1.005–1.030)
pH: 6 (ref 5.0–8.0)

## 2018-05-13 LAB — BASIC METABOLIC PANEL
Anion gap: 9 (ref 5–15)
BUN: 18 mg/dL (ref 8–23)
CO2: 32 mmol/L (ref 22–32)
Calcium: 8.5 mg/dL — ABNORMAL LOW (ref 8.9–10.3)
Chloride: 98 mmol/L (ref 98–111)
Creatinine, Ser: 0.84 mg/dL (ref 0.61–1.24)
GFR calc non Af Amer: >60 mL/min (ref >60)
Glucose, Bld: 109 mg/dL — ABNORMAL HIGH (ref 70–99)
POTASSIUM: 3.3 mmol/L — AB (ref 3.5–5.1)
SODIUM: 139 mmol/L (ref 135–145)

## 2018-05-13 LAB — PROTIME-INR
INR: 1.18
Prothrombin Time: 14.9 seconds (ref 11.4–15.2)

## 2018-05-13 LAB — HEPATIC FUNCTION PANEL
ALT: 65 U/L — ABNORMAL HIGH (ref 0–44)
AST: 43 U/L — AB (ref 15–41)
Albumin: 2.9 g/dL — ABNORMAL LOW (ref 3.5–5.0)
Alkaline Phosphatase: 97 U/L (ref 38–126)
BILIRUBIN DIRECT: 0.5 mg/dL — AB (ref 0.0–0.2)
BILIRUBIN INDIRECT: 1.3 mg/dL — AB (ref 0.3–0.9)
Total Bilirubin: 1.8 mg/dL — ABNORMAL HIGH (ref 0.3–1.2)
Total Protein: 5.6 g/dL — ABNORMAL LOW (ref 6.5–8.1)

## 2018-05-13 LAB — TSH: TSH: 1.516 u[IU]/mL (ref 0.350–4.500)

## 2018-05-13 MED ORDER — AMIODARONE HCL 400 MG PO TABS: 400.0000 mg | ORAL_TABLET | Freq: Two times a day (BID) | ORAL | 4 refills | Status: DC

## 2018-05-13 MED ORDER — POTASSIUM CHLORIDE CRYS ER 20 MEQ PO TBCR
40.0000 meq | EXTENDED_RELEASE_TABLET | Freq: Once | ORAL | Status: AC
  Administered 2018-05-13: 40 meq via ORAL
  Filled 2018-05-13: qty 2

## 2018-05-13 MED ORDER — METOPROLOL SUCCINATE ER 200 MG PO TB24: 200.0000 mg | ORAL_TABLET | Freq: Every day | ORAL | 4 refills | Status: DC

## 2018-05-13 MED ORDER — AMIODARONE HCL 200 MG PO TABS
400.0000 mg | ORAL_TABLET | Freq: Two times a day (BID) | ORAL | Status: DC
  Administered 2018-05-13: 400 mg via ORAL
  Filled 2018-05-13: qty 2

## 2018-05-13 MED ORDER — POTASSIUM CHLORIDE CRYS ER 20 MEQ PO TBCR: 20.0000 meq | EXTENDED_RELEASE_TABLET | Freq: Every day | ORAL | Status: DC

## 2018-05-13 MED ORDER — POTASSIUM CHLORIDE CRYS ER 20 MEQ PO TBCR
20.0000 meq | EXTENDED_RELEASE_TABLET | Freq: Every day | ORAL | 4 refills | Status: DC
Start: 2018-05-14 — End: 2018-06-22

## 2018-05-13 MED ORDER — FUROSEMIDE 40 MG PO TABS: 40.0000 mg | ORAL_TABLET | Freq: Every day | ORAL | 4 refills | Status: DC

## 2018-05-13 NOTE — Telephone Encounter (Signed)
Patient still admitted.

## 2018-05-13 NOTE — Progress Notes (Signed)
Progress Note  Patient Name: [name] Date of Encounter: 05/13/2018  Primary Cardiologist: Dr. Percival Spanish, MD   Subjective   Pt feeling better today. Wants to go home. HR improved on Amio even with ambulation.  Inpatient Medications    Scheduled Meds: . sodium chloride   Intravenous Once  . diltiazem  120 mg Oral Daily  . docusate sodium  100 mg Oral BID  . ezetimibe  10 mg Oral Daily  . ferrous sulfate  325 mg Oral TID PC  . finasteride  5 mg Oral Daily  . furosemide  40 mg Oral Daily  . metoprolol succinate  200 mg Oral Daily  . pantoprazole  40 mg Oral Daily  . Polyethyl Glycol-Propyl Glycol  1 drop Both Eyes QHS   Continuous Infusions: . amiodarone 30 mg/hr (05/13/18 0310)   PRN Meds: acetaminophen, alum & mag hydroxide-simeth, bisacodyl, bisacodyl, calcium carbonate, fluticasone, HYDROmorphone (DILAUDID) injection, magnesium citrate, menthol-cetylpyridinium **OR** phenol, methocarbamol, metoCLOPramide **OR** metoCLOPramide (REGLAN) injection, naloxone, ondansetron **OR** ondansetron (ZOFRAN) IV, oxyCODONE, oxyCODONE, polyethylene glycol, sodium chloride, sodium phosphate   Vital Signs    Vitals:   05/13/18 0334 05/13/18 0400 05/13/18 0500 05/13/18 0600  BP:  (!) 141/61 (!) 142/88 (!) 152/78  Pulse:  91 94 96  Resp:  11 15 13   Temp: 98.5 F (36.9 C)     TempSrc: Oral     SpO2:  97% 99% 93%  Weight:    195 lb 1.7 oz (88.5 kg)  Height:        Intake/Output Summary (Last 24 hours) at 05/13/2018 0743 Last data filed at 05/13/2018 0600 Gross per 24 hour  Intake 481.53 ml  Output 1925 ml  Net -1443.47 ml   Filed Weights   05/10/18 1325 05/12/18 0203 05/13/18 0600  Weight: 205 lb 0.4 oz (93 kg) 195 lb 1.7 oz (88.5 kg) 195 lb 1.7 oz (88.5 kg)   Physical Exam   General: Elderly, NAD Skin: Warm, dry, intact  Head: Normocephalic, atraumatic, clear, moist mucus membranes. Neck: Negative for carotid bruits. No JVD Lungs:Clear to ausculation bilaterally. No  wheezes, rales, or rhonchi. Breathing is unlabored. Cardiovascular: Irregularly irregular with S1 S2. No murmurs, rubs or gallops Abdomen: Soft, non-tender, non-distended with normoactive bowel sounds. No obvious abdominal masses. MSK: Strength and tone appear normal for age. 5/5 in all extremities Extremities: Left LE 2+ edema. No clubbing or cyanosis. DP/PT pulses 1+ bilaterally Neuro: Alert and oriented. No focal deficits. No facial asymmetry. MAE spontaneously. Psych: Responds to questions appropriately with normal affect.    Labs    Chemistry Recent Labs  Lab 05/10/18 0306 05/12/18 0314 05/13/18 0313  NA 139 140 139  K 3.5 3.4* 3.3*  CL 103 97* 98  CO2 28 33* 32  GLUCOSE 100* 105* 109*  BUN 24* 19 18  CREATININE 0.93 0.91 0.84  CALCIUM 8.2* 8.4* 8.5*  GFRNONAA >60 >60 >60  GFRAA >60 >60 >60  ANIONGAP 8 10 9      Hematology Recent Labs  Lab 05/10/18 0306  05/11/18 0747 05/11/18 1820 05/12/18 1648  WBC 13.0*  --   --  14.7* 16.1*  RBC 2.50*  --   --  3.45* 3.49*  HGB 7.7*   < > 11.0* 10.8* 10.9*  HCT 23.1*   < > 32.5* 31.4* 32.4*  MCV 92.4  --   --  91.0 92.8  MCH 30.8  --   --  31.3 31.2  MCHC 33.3  --   --  34.4 33.6  RDW 16.9*  --   --  16.9* 16.7*  PLT 222  --   --  310 348   < > = values in this interval not displayed.    Cardiac Enzymes Recent Labs  Lab 05/07/18 0149 05/07/18 0837 05/07/18 1938  TROPONINI <0.03 <0.03 <0.03   No results for input(s): TROPIPOC in the last 168 hours.   BNPNo results for input(s): BNP, PROBNP in the last 168 hours.   DDimer No results for input(s): DDIMER in the last 168 hours.   Radiology    No results found.  Telemetry    05/13/18 Atrial fibrillation HR 92 - Personally Reviewed  ECG    No new tracing as of 05/13/18 - Personally Reviewed  Cardiac Studies   ECHO:04/05/2017: Study Conclusions  - Left ventricle: The cavity size was normal. Wall thickness was increased in a pattern of mild LVH.  Systolic function was mildly to moderately reduced. The estimated ejection fraction was in the range of 40% to 45%. Diffuse hypokinesis. The study is not technically sufficient to allow evaluation of LV diastolic function. - Aortic valve: Mildly calcified annulus. Trileaflet; mildly thickened leaflets. Valve area (VTI): 2.4 cm^2. Valve area (Vmax): 2.35 cm^2. Valve area (Vmean): 2.46 cm^2. - Mitral valve: Mildly calcified annulus. Mildly thickened leaflets . There was mild regurgitation. - Left atrium: The atrium was severely dilated. - Right ventricle: The cavity size was mildly dilated. - Right atrium: The atrium was severely dilated. - Technically adequate study.  Lexiscan Myoview stress test 04/05/2017:  There was no ST segment deviation noted during stress.  Findings consistent with prior inferior/inferolateral myocardial infarction. There is no current ischemia.  This is an intermediate risk study. Risk based on decreased ejection fraction, there is no current myocardium at jeopardy. Recommend correlating LVEF with echo  The left ventricular ejection fraction is mildly decreased (45-54%).  Patient Profile     84 y.o. male with a hx of persistentatrial fibrillationin chronic Coumadin therapy, CADs/p CABG 2005, cardiomyopathywith LVEF of 40-45% per echo 03/2017, hypertension, hyperlipidemia and carotid stenosis who is being seen today foranticoagulation recommendations and suspected CHF exacerbationat the request ofDr. Giofree.  Assessment & Plan    1.Persistent atrial fibrillation: -Remains in AF with better rate control. HR in the upper 80-90's throughout the night per tele review. More stable with ambulation today -INR today, 1.18 -IV Amiodarone started 05/12/18 with improvement>> will transition to PO form 400 mg BID for 10-12 days (until follow up), then 200 QD if HR stable  -Will check LFT's and TSH today  -Stop diltiazem 120 mg daily -Continue  Toprol-XL 200 mg daily and continue to evaluate>>>HR better but not at baseline with ambulation. BP stable  -Hold warfarin until 05/18/18 with no Lovenox bridge per MD  -CHA2DS2VASc =4  2. Acute blood loss anemia secondary to left hip postoperative bleeding: -Hb,10.9 05/12/18>>>no active bleeding per ortho -See plan for anticoagulation resumption above -Will need to keep Hb >8.0  3. Acute on chronic systolic congestive heart failure: -Prior echocardiogram 04/05/2017 with LV EF of 40 to 45% with diffuse hypokinesis -Weight actually increased from admission weight of 190lbon 05/04/2018? Accuracy of weights?  -I&O, net -1.4L since admission>>good diuresis -Continue Lasix 40 mg PO daily  -K+ of 3.3 today>>>will replace with KDUR 40 meq today and recheck in AM  -Strict I&O and daily weights  4. Hypotension: -Resolved,likelysecondary to acute blood loss as outlined above -152/78>142/88>141/61>139/63 -Continue Toprol XL 200 mg daily -Continue Lasix 40 mg  PO daily   5. History of [name] s/p CABG 2005: -Denies anginal symptoms -Continuezetia    Signed, Kathyrn Drown NP-C [facility] Pager: [phone] 05/13/2018, 7:43 AM     For questions or updates, please contact   Please consult [email or web address] for contact info under Cardiology/STEMI.

## 2018-05-13 NOTE — Progress Notes (Signed)
Subjective: 9 Days Post-Op Procedure(s) (LRB): LEFT TOTAL HIP ARTHROPLASTY (Left) Patient reports pain as 1 on 0-10 scale.The main issue is his Atrial Fibrillation. We were ready to DC him yesterday and while he was ambulating his heart rate went up to 150. He is now under better control. There is no further bleeding and his HBg is stable. I will wait for Cardiac clearance to DC.His wound site is dry and his thigh swelling is returning to normal. He is sitting up in a chair and is very comfortable.   Pulse is 96 .K-3.3. Objective: Vital signs in last 24 hours: Temp:  [98.4 F (36.9 C)-99.5 F (37.5 C)] 98.5 F (36.9 C) (08/02 0334) Pulse Rate:  [58-127] 96 (08/02 0600) Resp:  [10-23] 13 (08/02 0600) BP: (96-158)/(51-96) 152/78 (08/02 0600) SpO2:  [92 %-99 %] 93 % (08/02 0600) Weight:  [88.5 kg (195 lb 1.7 oz)] 88.5 kg (195 lb 1.7 oz) (08/02 0600)  Intake/Output from previous day: 08/01 0701 - 08/02 0700 In: 481.5 [P.O.:240; I.V.:241.5] Out: 1925 [Urine:1925] Intake/Output this shift: No intake/output data recorded.  Recent Labs    05/10/18 1700 05/11/18 0747 05/11/18 1820 05/12/18 1648  HGB 10.6* 11.0* 10.8* 10.9*   Recent Labs    05/11/18 1820 05/12/18 1648  WBC 14.7* 16.1*  RBC 3.45* 3.49*  HCT 31.4* 32.4*  PLT 310 348   Recent Labs    05/12/18 0314 05/13/18 0313  NA 140 139  K 3.4* 3.3*  CL 97* 98  CO2 33* 32  BUN 19 18  CREATININE 0.91 0.84  GLUCOSE 105* 109*  CALCIUM 8.4* 8.5*   Recent Labs    05/12/18 0314 05/13/18 0313  INR 1.15 1.18    Neurovascular intact No cellulitis present  Doing much better.  Assessment/Plan: 9 Days Post-Op Procedure(s) (LRB): LEFT TOTAL HIP ARTHROPLASTY (Left) Up with therapy    [name] 05/13/2018, 7:13 AM

## 2018-05-13 NOTE — Telephone Encounter (Signed)
New Message   Patient is scheduled for [facility] appointment with Jory Sims on 05/25/2018.

## 2018-05-13 NOTE — Progress Notes (Signed)
Physical Therapy Treatment Patient Details Name: [name] MRN: [id number] DOB: 25-Nov-1931 Today's Date: 05/13/2018    History of Present Illness 84 yo male s/p L THA-posterior approach. Rapid response 7/27 for A fib. Transferred to ICU. PMH including R THA, R TKA, cardiac bypass surgery in 2005.     PT Comments    Pt with improved HR response to activity today, maintaining HR of 91-115 for the duration of the session. PT practiced home exercises with the pt, and handout of all posterior exercises administered and reviewed. Both pt and family trained on mobility skills, including transfers and stair mobility. Will follow up tomorrow morning if not DCed.    Follow Up Recommendations  Follow surgeon's recommendation for DC plan and follow-up therapies(family concerned about home health follow up because no one has told them recently if it is set up, followed up with nursing after session and she will contact the case manager to go over home health order with family)     Equipment Recommendations  None recommended by PT    Recommendations for Other Services       Precautions / Restrictions Precautions Precautions: Posterior Hip;Fall Precaution Comments: pt verbalized 3/3 posterior hip precautions, hematoma over L gluteal region  Restrictions LLE Weight Bearing: Weight bearing as tolerated LLE Partial Weight Bearing Percentage or Pounds: pt is now WBAT per Dr Gladstone Lighter on 7/25    Mobility  Bed Mobility Overal bed mobility: Needs Assistance Bed Mobility: Supine to Sit     Supine to sit: Supervision Sit to supine: Min assist   General bed mobility comments: verbal cues for maintaining precautions and self assist, wife assisting with LLE going from sit to supine   Transfers Overall transfer level: Needs assistance Equipment used: Rolling walker (2 wheeled) Transfers: Sit to/from Stand Sit to Stand: Min guard         General transfer comment: Increased time to perform.  Wife and daughter of pt instructed on assisting pt when standing, by guarding and supporting pt's low back/pelvis as needed.   Ambulation/Gait Ambulation/Gait assistance: +2 safety/equipment;Min guard Gait Distance (Feet): 30 Feet Assistive device: Rolling walker (2 wheeled) Gait Pattern/deviations: Step-to pattern;Trunk flexed;Decreased stance time - left;Antalgic Gait velocity: decreased    General Gait Details: Pt maintained HR between 90-115 during ambulation. No verbal cuing needed to maintain precautions.    Stairs   Stairs assistance: Min guard Stair Management: No rails;With walker;Backwards Number of Stairs: 1 General stair comments: verbal cuing for sequencing. Family instructed on stair navigation.    Wheelchair Mobility    Modified Rankin (Stroke Patients Only)       Balance Overall balance assessment: Mild deficits observed, not formally tested                                          Cognition Arousal/Alertness: Awake/alert Behavior During Therapy: WFL for tasks assessed/performed Overall Cognitive Status: Within Functional Limits for tasks assessed                                        Exercises Total Joint Exercises Ankle Circles/Pumps: AROM;Both;Supine;10 reps Quad Sets: AROM;Left;Supine;10 reps Short Arc Quad: AROM;Left;Supine;10 reps Heel Slides: AAROM;Left;Supine;10 reps(within precautions) Hip ABduction/ADduction: 10 reps;Supine;Left;AROM    General Comments        Pertinent Vitals/Pain  Pain Assessment: No/denies pain    Home Living                      Prior Function            PT Goals (current goals can now be found in the care plan section) Acute Rehab PT Goals PT Goal Formulation: With patient Time For Goal Achievement: 05/27/18 Potential to Achieve Goals: Good Progress towards PT goals: Progressing toward goals    Frequency    7X/week      PT Plan Current plan remains  appropriate    Co-evaluation              AM-PAC PT "6 Clicks" Daily Activity  Outcome Measure  Difficulty turning over in bed (including adjusting bedclothes, sheets and blankets)?: A Little Difficulty moving from lying on back to sitting on the side of the bed? : A Little Difficulty sitting down on and standing up from a chair with arms (e.g., wheelchair, bedside commode, etc,.)?: A Little Help needed moving to and from a bed to chair (including a wheelchair)?: A Little Help needed walking in hospital room?: A Little Help needed climbing 3-5 steps with a railing? : A Little 6 Click Score: 18    End of Session Equipment Utilized During Treatment: Gait belt Activity Tolerance: Patient tolerated treatment well;No increased pain Patient left: in bed;with family/visitor present;with call bell/phone within reach Nurse Communication: Mobility status PT Visit Diagnosis: Other abnormalities of gait and mobility (R26.89);Difficulty in walking, not elsewhere classified (R26.2)     Time: 9688-6484 PT Time Calculation (min) (ACUTE ONLY): 22 min  Charges:  $Gait Training: 8-22 mins                     Sreenidhi Ganson Conception Chancy, PT, DPT  Pager # [phone]     Carolee Channell D Yandel Zeiner 05/13/2018, 3:12 PM

## 2018-05-14 DIAGNOSIS — I2510 Atherosclerotic heart disease of native coronary artery without angina pectoris: Secondary | ICD-10-CM | POA: Diagnosis not present

## 2018-05-14 DIAGNOSIS — D649 Anemia, unspecified: Secondary | ICD-10-CM | POA: Diagnosis not present

## 2018-05-14 DIAGNOSIS — I110 Hypertensive heart disease with heart failure: Secondary | ICD-10-CM | POA: Diagnosis not present

## 2018-05-14 DIAGNOSIS — I739 Peripheral vascular disease, unspecified: Secondary | ICD-10-CM | POA: Diagnosis not present

## 2018-05-14 DIAGNOSIS — M1991 Primary osteoarthritis, unspecified site: Secondary | ICD-10-CM | POA: Diagnosis not present

## 2018-05-14 DIAGNOSIS — I482 Chronic atrial fibrillation: Secondary | ICD-10-CM | POA: Diagnosis not present

## 2018-05-14 DIAGNOSIS — Z471 Aftercare following joint replacement surgery: Secondary | ICD-10-CM | POA: Diagnosis not present

## 2018-05-14 DIAGNOSIS — M1712 Unilateral primary osteoarthritis, left knee: Secondary | ICD-10-CM | POA: Diagnosis not present

## 2018-05-14 DIAGNOSIS — D72829 Elevated white blood cell count, unspecified: Secondary | ICD-10-CM | POA: Diagnosis not present

## 2018-05-14 DIAGNOSIS — I5043 Acute on chronic combined systolic (congestive) and diastolic (congestive) heart failure: Secondary | ICD-10-CM | POA: Diagnosis not present

## 2018-05-14 DIAGNOSIS — I6522 Occlusion and stenosis of left carotid artery: Secondary | ICD-10-CM | POA: Diagnosis not present

## 2018-05-14 DIAGNOSIS — E559 Vitamin D deficiency, unspecified: Secondary | ICD-10-CM | POA: Diagnosis not present

## 2018-05-16 DIAGNOSIS — I6522 Occlusion and stenosis of left carotid artery: Secondary | ICD-10-CM | POA: Diagnosis not present

## 2018-05-16 DIAGNOSIS — D500 Iron deficiency anemia secondary to blood loss (chronic): Secondary | ICD-10-CM | POA: Diagnosis not present

## 2018-05-16 DIAGNOSIS — I482 Chronic atrial fibrillation: Secondary | ICD-10-CM | POA: Diagnosis not present

## 2018-05-16 DIAGNOSIS — I2510 Atherosclerotic heart disease of native coronary artery without angina pectoris: Secondary | ICD-10-CM | POA: Diagnosis not present

## 2018-05-16 DIAGNOSIS — E559 Vitamin D deficiency, unspecified: Secondary | ICD-10-CM | POA: Diagnosis not present

## 2018-05-16 DIAGNOSIS — D649 Anemia, unspecified: Secondary | ICD-10-CM | POA: Diagnosis not present

## 2018-05-16 DIAGNOSIS — I739 Peripheral vascular disease, unspecified: Secondary | ICD-10-CM | POA: Diagnosis not present

## 2018-05-16 DIAGNOSIS — M1991 Primary osteoarthritis, unspecified site: Secondary | ICD-10-CM | POA: Diagnosis not present

## 2018-05-16 DIAGNOSIS — D72829 Elevated white blood cell count, unspecified: Secondary | ICD-10-CM | POA: Diagnosis not present

## 2018-05-16 DIAGNOSIS — I110 Hypertensive heart disease with heart failure: Secondary | ICD-10-CM | POA: Diagnosis not present

## 2018-05-16 DIAGNOSIS — I5043 Acute on chronic combined systolic (congestive) and diastolic (congestive) heart failure: Secondary | ICD-10-CM | POA: Diagnosis not present

## 2018-05-16 DIAGNOSIS — Z471 Aftercare following joint replacement surgery: Secondary | ICD-10-CM | POA: Diagnosis not present

## 2018-05-16 DIAGNOSIS — M1712 Unilateral primary osteoarthritis, left knee: Secondary | ICD-10-CM | POA: Diagnosis not present

## 2018-05-16 NOTE — Telephone Encounter (Signed)
Patient contacted regarding discharge from [facility] on 05/14/18.  Patient understands to follow up with provider [facility] on 05/25/18 at 1:30 PM at [facility]. Patient understands discharge instructions? yes or no} Patient understands medications and regiment? yes  Patient understands to bring all medications to this visit? yes   WIFE STATES SHE TALKED TO DR GIOFFRE ON Saturday.  PATIENT IS TO BE TAKING 75 MG ASPIRIN TWICE A DAY  AND WARFARIN IS ON HOLD. SHE STATES PATIENT  IS DOING THAT .  TIME OF ABOVE APPOINTMENT WAS CHANGED DUE TO WIFE REQUEST . THEY LIVE 50 MILES FROM THE OFFICE AND WANTED ALATER APPOINTMENT TIME.

## 2018-05-17 NOTE — Discharge Summary (Signed)
Physician Discharge Summary   Patient ID: [name] MRN: [id number] DOB/AGE: 12/23/1931 84 y.o.  Admit date: 05/04/2018 Discharge date: 05/13/2018  Primary Diagnosis: Primary osteoarthritis left hip  Admission Diagnoses:  Past Medical History:  Diagnosis Date  . Anemia   . Atrial fibrillation ([place])   . Cataract   . Cellulitis    In past  . Complication of anesthesia    HARD TIME WAKING; CAUSES MY BP TO GO UP   . Coronary artery disease    5 bypasses  . DJD (degenerative joint disease)   . Ejection fraction < 50%    Mildly reduced, 40% by echo  . GERD (gastroesophageal reflux disease)   . Hyperlipidemia   . Hypertension   . Persistent atrial fibrillation ([place])   . Prostate hypertrophy    on CT scan 09/2014  . PUD (peptic ulcer disease)    RESOLVED   . Skin cancer of eyelid    Resected   Discharge Diagnoses:   Principal Problem:   H/O total hip arthroplasty, left Active Problems:   ANEMIA   Acute blood loss as cause of postoperative anemia   Atrial fibrillation with RVR (HCC)   Hypotension   Acute on chronic combined systolic and diastolic CHF (congestive heart failure) (HCC)   Cough   Leukocytosis  Estimated body mass index is 30.56 kg/m as calculated from the following:   Height as of this encounter: _0  (1.702 m).   Weight as of this encounter: 88.5 kg (195 lb 1.7 oz).  Procedure(s) (LRB): LEFT TOTAL HIP ARTHROPLASTY (Left)   Consults: cardiology  HPI: [name], 84 y.o. male, has a history of pain and functional disability in the left hip(s) due to arthritis and patient has failed non-surgical conservative treatments for greater than 12 weeks to include NSAID's and/or analgesics, corticosteriod injections, flexibility and strengthening excercises, use of assistive devices and activity modification.  Onset of symptoms was gradual starting 3 years ago with gradually worsening course since that time.The patient noted no past surgery on the left hip(s).   Patient currently rates pain in the left hip at 6 out of 10 with activity. Patient has night pain, worsening of pain with activity and weight bearing, pain that interfers with activities of daily living and pain with passive range of motion. Patient has evidence of subchondral cysts, subchondral sclerosis, periarticular osteophytes and joint space narrowing by imaging studies. This condition presents safety issues increasing the risk of falls.  There is no current active infection.    Laboratory Data: Admission on 05/04/2018, Discharged on 05/13/2018  No results displayed because visit has over 200 results.    Hospital Outpatient Visit on 04/25/2018  Component Date Value Ref Range Status  . aPTT 04/25/2018 32  24 - 36 seconds Final   Performed at [facility], [place] [address], [place], [place] [address]  . WBC 04/25/2018 13.2* 4.0 - 10.5 K/uL Final  . RBC 04/25/2018 4.32  4.22 - 5.81 MIL/uL Final  . Hemoglobin 04/25/2018 14.2  13.0 - 17.0 g/dL Final  . HCT 04/25/2018 43.5  39.0 - 52.0 % Final  . MCV 04/25/2018 100.7* 78.0 - 100.0 fL Final  . MCH 04/25/2018 32.9  26.0 - 34.0 pg Final  . MCHC 04/25/2018 32.6  30.0 - 36.0 g/dL Final  . RDW 04/25/2018 13.1  11.5 - 15.5 % Final  . Platelets 04/25/2018 236  150 - 400 K/uL Final  . Neutrophils Relative % 04/25/2018 49  % Final  .  Neutro Abs 04/25/2018 6.5  1.7 - 7.7 K/uL Final  . Lymphocytes Relative 04/25/2018 41  % Final  . Lymphs Abs 04/25/2018 5.4* 0.7 - 4.0 K/uL Final  . Monocytes Relative 04/25/2018 5  % Final  . Monocytes Absolute 04/25/2018 0.6  0.1 - 1.0 K/uL Final  . Eosinophils Relative 04/25/2018 5  % Final  . Eosinophils Absolute 04/25/2018 0.6  0.0 - 0.7 K/uL Final  . Basophils Relative 04/25/2018 0  % Final  . Basophils Absolute 04/25/2018 0.0  0.0 - 0.1 K/uL Final   Performed at [facility], [place] [address] [place], [place] [address]  . Sodium 04/25/2018 142  135 - 145 mmol/L Final  .  Potassium 04/25/2018 4.7  3.5 - 5.1 mmol/L Final  . Chloride 04/25/2018 105  98 - 111 mmol/L Final   Please note change in reference range.  . CO2 04/25/2018 27  22 - 32 mmol/L Final  . Glucose, Bld 04/25/2018 114* 70 - 99 mg/dL Final   Please note change in reference range.  . BUN 04/25/2018 13  8 - 23 mg/dL Final   Please note change in reference range.  . Creatinine, Ser 04/25/2018 1.01  0.61 - 1.24 mg/dL Final  . Calcium 04/25/2018 9.3  8.9 - 10.3 mg/dL Final  . Total Protein 04/25/2018 7.4  6.5 - 8.1 g/dL Final  . Albumin 04/25/2018 4.2  3.5 - 5.0 g/dL Final  . AST 04/25/2018 20  15 - 41 U/L Final  . ALT 04/25/2018 17  0 - 44 U/L Final   Please note change in reference range.  . Alkaline Phosphatase 04/25/2018 50  38 - 126 U/L Final  . Total Bilirubin 04/25/2018 0.9  0.3 - 1.2 mg/dL Final  . GFR calc non Af Amer 04/25/2018 >60  >60 mL/min Final  . GFR calc Af Amer 04/25/2018 >60  >60 mL/min Final   Comment: (NOTE) The eGFR has been calculated using the CKD EPI equation. This calculation has not been validated in all clinical situations. eGFR's persistently <60 mL/min signify possible Chronic Kidney Disease.   Georgiann Hahn gap 04/25/2018 10  5 - 15 Final   Performed at [facility], [place] [address] [place], [place] [address]  . Prothrombin Time 04/25/2018 21.2* 11.4 - 15.2 seconds Final  . INR 04/25/2018 1.85   Final   Performed at [facility], [place] [address], [place], [place] [address]  . ABO/RH(D) 04/25/2018 B POS   Final  . Antibody Screen 04/25/2018 NEG   Final  . Sample Expiration 04/25/2018 05/07/2018   Final  . Extend sample reason 04/25/2018    Final                   Value:NO TRANSFUSIONS OR PREGNANCY IN THE PAST 3 MONTHS Performed at [facility], [place] [address], [place], [place] [address]   . MRSA, PCR 04/25/2018 NEGATIVE  NEGATIVE Final  . Staphylococcus aureus 04/25/2018 NEGATIVE  NEGATIVE Final   Comment:  (NOTE) The Xpert SA Assay (FDA approved for NASAL specimens in patients 39 years of age and older), is one component of a comprehensive surveillance program. It is not intended to diagnose infection nor to guide or monitor treatment. Performed at [facility], [place] [address], [place],  [address]   Anti-coag visit on 04/22/2018  Component Date Value Ref Range Status  . INR 04/22/2018 2.3* 0.9 - 1.1 Final  . Prothrombin Time 04/22/2018 27.2  sec  Final   Comment: Differences in reagents, instruments, and pre-analytical variables can affect prothrombin time results.  These factors should be considered when comparing different prothrombin time test methods. Please Note: This test should not be used to monitor persons on heparin therapy.      X-Rays:Ct Hip Left W Wo Contrast  Result Date: 05/07/2018 CLINICAL DATA:  84 year old male status post left hip arthroplasty EXAM: CT LEFT LOWER EXTREMITY WITHOUT AND WITH IV CONTRAST TECHNIQUE: Multidetector CT imaging of the lower left extremity was performed following the standard protocol before and during bolus administration of intravenous contrast. COMPARISON:  Plain film 05/04/2018, CT 02/28/2015 CONTRAST:  12m ISOVUE-300 IOPAMIDOL (ISOVUE-300) INJECTION 61% FINDINGS: Bones/Joint/Cartilage Surgical changes of left hip arthroplasty. The acetabular and femoral components are congruent. No fracture adjacent to the femoral components. No acetabular fracture. Visualized bony pelvis without fracture. Ligaments Suboptimally assessed by CT. Soft tissues Intermediate density fluid within the superficial soft tissues of the proximal left thigh, incompletely imaged. Greatest diameter 7.3 cm x 3.7 cm on axial images. This fluid collection is superficial to the tensor fascia lata. Gas and fluid within the quadriceps musculature, extending from the proximal thigh to the distal thigh. Small amount of gas within the deep am string musculature  myofascial planes in the proximal thigh extending medially into the adductor musculature. Intermediate density fluid collection with a fluid fluid level starting in the gluteal musculature, and extending into the hamstring musculature of the posterior thigh. The metallic artifact somewhat limits evaluation, however, estimated diameter on axial images 8 0.0 cm x 5.4 cm, and an estimated cranial caudal span of approximately 15.6 cm on the coronal reformatted images. Vasculature: Atherosclerotic changes of the left iliac system and the common femoral artery. Arteries are patent. The CT is not optimized for the most sensitive evaluation of the arteries (not a CT angiogram), however, there is no evidence of hyperdense pooling contrast to suggest extravasation. IMPRESSION: Early postoperative changes of left hip arthroplasty, with expected gas and fluid within the surgical bed including quadriceps musculature, hamstring musculature, and the adductor musculature. Hematoma of the gluteal musculature with a fluid-fluid level, largest diameter on axial images 8 cm x 5.4 cm, and largest cranial caudal span on the coronal images of 15.6 cm. Hematoma in the superficial tissues overlying the tensor fascia lata measuring as great as 7.3 cm on the axial images. Atherosclerosis. Electronically Signed   By: JCorrie MckusickD.O.   On: 05/07/2018 11:37   Dg Chest Port 1 View  Result Date: 05/13/2018 CLINICAL DATA:  Cough and weakness EXAM: PORTABLE CHEST 1 VIEW COMPARISON:  05/07/2018 FINDINGS: Borderline heart size with aortic tortuosity that is distorted by rotation. Status post CABG. There is no edema, consolidation, effusion, or pneumothorax. Artifact from EKG leads. IMPRESSION: No evidence of active disease. Electronically Signed   By: JMonte FantasiaM.D.   On: 05/13/2018 12:36   Dg Chest Port 1 View  Result Date: 05/07/2018 CLINICAL DATA:  Hypotensive EXAM: PORTABLE CHEST 1 VIEW COMPARISON:  03/15/2018 FINDINGS: Post  sternotomy changes. Cardiomegaly. No acute airspace disease or effusion. No pneumothorax. Skin fold artifact over the left lower chest. IMPRESSION: No active disease.  Cardiomegaly. Electronically Signed   By: KDonavan FoilM.D.   On: 05/07/2018 01:53   Dg Hip Port Unilat With Pelvis 1v Left  Result Date: 05/04/2018 CLINICAL DATA:  Left hip arthroplasty. EXAM: DG HIP (WITH OR WITHOUT PELVIS) 1V PORT LEFT COMPARISON:  CT pelvis dated Feb 28, 2015. FINDINGS: The left hip  demonstrates a total arthroplasty without evidence of hardware failure or complication. There is expected intra-articular air. There is no fracture or dislocation. The alignment is anatomic. Post-surgical changes noted in the surrounding soft tissues. Vascular calcifications. IMPRESSION: Interval left hip total arthroplasty without evidence of acute postoperative complication. Electronically Signed   By: Titus Dubin M.D.   On: 05/04/2018 10:30    EKG: Orders placed or performed during the hospital encounter of 05/04/18  . EKG 12-Lead  . EKG 12-Lead     Hospital Course: Patient was admitted to [facility] and taken to the OR and underwent the above state procedure without complications.  Patient tolerated the procedure well and was later transferred to the recovery room and then to the orthopaedic floor for postoperative care.  They were given PO and IV analgesics for pain control following their surgery.  They were given 24 hours of postoperative antibiotics of  Anti-infectives (From admission, onward)   Start     Dose/Rate Route Frequency Ordered Stop   05/04/18 1800  vancomycin (VANCOCIN) IVPB 1000 mg/200 mL premix     1,000 mg 200 mL/hr over 60 Minutes Intravenous Every 12 hours 05/04/18 1107 05/04/18 1900   05/04/18 0835  polymyxin B 500,000 Units, bacitracin 50,000 Units in sodium chloride 0.9 % 500 mL irrigation  Status:  Discontinued       As needed 05/04/18 0835 05/04/18 0920   05/04/18 0600  vancomycin  (VANCOCIN) IVPB 1000 mg/200 mL premix     1,000 mg 200 mL/hr over 60 Minutes Intravenous On call to O.R. 05/04/18 0544 05/04/18 0745     and started on DVT prophylaxis in the form of Lovenox and Coumadin per pharmacy protocol.   PT and OT were ordered for total hip protocol.  The patient was allowed to be PWB 50% with therapy. Discharge planning was consulted to help with postop disposition and equipment needs.  Patient had a fair night on the evening of surgery.  They started to get up OOB with therapy on day one. The knee immobilizer was removed and discontinued.  Continued to work with therapy into day two.  Dressing was changed on day two and the incision was clean and dry.  He did start to have an increase in left thigh pain post op day two. It became apparent that he was developing a post op hematoma secondary to his anticoagulation. Unfortunately he also went into atrial fibrillation and tachycardia in addition to poor blood pressure control both elevated and orthostatic hypotension. He continue to decline and was transferred to telemetry for monitoring. He received multiple transfusions due to ABLA and anticoagulation was stopped. He required IV Lasix for fluid overload.  Heart rates improved after starting amiodarone.  Thyroid function is normal and LFTs are mildly abnormal.  Bilirubin also abnormal.  Cardio stopped diltiazem given his reduced systolic function.  Continue metoprolol succinate.  Resume warfarin without a bridge 8/7. He stabilizes and was discharge home.   Diet: Cardiac diet Activity:WBAT No bending hip over 90 degrees- A "L" Angle Do not cross legs Do not let foot roll inward When turning these patients a pillow should be placed between the patient's legs to prevent crossing. Patients should have the affected knee fully extended when trying to sit or stand from all surfaces to prevent excessive hip flexion. When ambulating and turning toward the affected side the affected leg  should have the toes turned out prior to moving the walker and the rest of patient's body  as to prevent internal rotation/ turning in of the leg. Abduction pillows are the most effective way to prevent a patient from not crossing legs or turning toes in at rest. If an abduction pillow is not ordered placing a regular pillow length wise between the patient's legs is also an effective reminder. It is imperative that these precautions be maintained so that the surgical hip does not dislocate. Follow-up:in 5 days Disposition - Home Discharged Condition: stable   Discharge Instructions    Call MD / Call 911   Complete by:  As directed    If you experience chest pain or shortness of breath, CALL 911 and be transported to the hospital emergency room.  If you develope a fever above 101 F, pus (white drainage) or increased drainage or redness at the wound, or calf pain, call your surgeon's office.   Call MD / Call 911   Complete by:  As directed    If you experience chest pain or shortness of breath, CALL 911 and be transported to the hospital emergency room.  If you develope a fever above 101 F, pus (white drainage) or increased drainage or redness at the wound, or calf pain, call your surgeon's office.   Change dressing   Complete by:  As directed    Leave the dressing over the incision, we will remove this at the office.   Constipation Prevention   Complete by:  As directed    Drink plenty of fluids.  Prune juice may be helpful.  You may use a stool softener, such as Colace (over the counter) 100 mg twice a day.  Use MiraLax (over the counter) for constipation as needed.   Constipation Prevention   Complete by:  As directed    Drink plenty of fluids.  Prune juice may be helpful.  You may use a stool softener, such as Colace (over the counter) 100 mg twice a day.  Use MiraLax (over the counter) for constipation as needed.   Diet - low sodium heart healthy   Complete by:  As directed    Diet - low  sodium heart healthy   Complete by:  As directed    Discharge instructions   Complete by:  As directed    Dr. [name] [facility] [place], [place] [address] [phone]   POSTERIOR TOTAL HIP REPLACEMENT POSTOPERATIVE DIRECTIONS  Hip Rehabilitation, Guidelines Following Surgery  The results of a hip operation are greatly improved after range of motion and muscle strengthening exercises. Follow all safety measures which are given to protect your hip. If any of these exercises cause increased pain or swelling in your joint, decrease the amount until you are comfortable again. Then slowly increase the exercises. Call your caregiver if you have problems or questions.   HOME CARE INSTRUCTIONS  Remove items at home which could result in a fall. This includes throw rugs or furniture in walking pathways.  ICE to the affected hip every three hours for 30 minutes at a time and then as needed for pain and swelling.  Continue to use ice on the hip for pain and swelling from surgery. You may notice swelling that will progress down to the foot and ankle.  This is normal after surgery.  Elevate the leg when you are not up walking on it.   Continue to use the breathing machine which will help keep your temperature down.  It is common for your temperature to cycle up and down  following surgery, especially at night when you are not up moving around and exerting yourself.  The breathing machine keeps your lungs expanded and your temperature down.  DIET You may resume your previous home diet once your are discharged from the hospital.  DRESSING / WOUND CARE / SHOWERING Keep the surgical dressing until follow up.  The dressing is water proof, so you can shower without any extra covering.  IF THE DRESSING FALLS OFF or the wound gets wet inside, change the dressing with sterile gauze.  Please use good hand washing techniques before changing the dressing.  Do not use any lotions  or creams on the incision until instructed by your surgeon.   You may start showering once you are discharged home but do not submerge the incision under water. Just pat the incision dry and apply a dry gauze dressing on daily. Change the surgical dressing daily and reapply a dry dressing each time.    ACTIVITY Walk with your walker as instructed. Use walker as long as suggested by your caregivers. Avoid periods of inactivity such as sitting longer than an hour when not asleep. This helps prevent blood clots.  You may resume a sexual relationship in one month or when given the OK by your doctor.  You may return to work once you are cleared by your doctor.  Do not drive a car for 6 weeks or until released by you surgeon.  Do not drive while taking narcotics.  WEIGHT BEARING Weight bearing as tolerated with assist device (walker, cane, etc) as directed, use it as long as suggested by your surgeon or therapist, typically at least 4-6 weeks.  POSTOPERATIVE CONSTIPATION PROTOCOL Constipation - defined medically as fewer than three stools per week and severe constipation as less than one stool per week.  One of the most common issues patients have following surgery is constipation.  Even if you have a regular bowel pattern at home, your normal regimen is likely to be disrupted due to multiple reasons following surgery.  Combination of anesthesia, postoperative narcotics, change in appetite and fluid intake all can affect your bowels.  In order to avoid complications following surgery, here are some recommendations in order to help you during your recovery period.  Colace (docusate) - Pick up an over-the-counter form of Colace or another stool softener and take twice a day as long as you are requiring postoperative pain medications.  Take with a full glass of water daily.  If you experience loose stools or diarrhea, hold the colace until you stool forms back up.  If your symptoms do not get better  within 1 week or if they get worse, check with your doctor.  Dulcolax (bisacodyl) - Pick up over-the-counter and take as directed by the product packaging as needed to assist with the movement of your bowels.  Take with a full glass of water.  Use this product as needed if not relieved by Colace only.   MiraLax (polyethylene glycol) - Pick up over-the-counter to have on hand.  MiraLax is a solution that will increase the amount of water in your bowels to assist with bowel movements.  Take as directed and can mix with a glass of water, juice, soda, coffee, or tea.  Take if you go more than two days without a movement. Do not use MiraLax more than once per day. Call your doctor if you are still constipated or irregular after using this medication for 7 days in a row.  If you continue  to have problems with postoperative constipation, please contact the office for further assistance and recommendations.  If you experience "the worst abdominal pain ever" or develop nausea or vomiting, please contact the office immediatly for further recommendations for treatment.  ITCHING  If you experience itching with your medications, try taking only a single pain pill, or even half a pain pill at a time.  You can also use Benadryl over the counter for itching or also to help with sleep.   TED HOSE STOCKINGS Wear the elastic stockings on both legs for three weeks following surgery during the day but you may remove then at night for sleeping.  MEDICATIONS See your medication summary on the "After Visit Summary" that the nursing staff will review with you prior to discharge.  You may have some home medications which will be placed on hold until you complete the course of blood thinner medication.  It is important for you to complete the blood thinner medication as prescribed by your surgeon.  Continue your approved medications as instructed at time of discharge.  PRECAUTIONS If you experience chest pain or shortness  of breath - call 911 immediately for transfer to the hospital emergency department.  If you develop a fever greater that 101 F, purulent drainage from wound, increased redness or drainage from wound, foul odor from the wound/dressing, or calf pain - CONTACT YOUR SURGEON.                                                   FOLLOW-UP APPOINTMENTS Make sure you keep all of your appointments after your operation with your surgeon and caregivers. You should call the office at the above phone number and make an appointment for approximately two weeks after the date of your surgery or on the date instructed by your surgeon outlined in the "After Visit Summary".  RANGE OF MOTION AND STRENGTHENING EXERCISES  These exercises are designed to help you keep full movement of your hip joint. Follow your caregiver's or physical therapist's instructions. Perform all exercises about fifteen times, three times per day or as directed. Exercise both hips, even if you have had only one joint replacement. These exercises can be done on a training (exercise) mat, on the floor, on a table or on a bed. Use whatever works the best and is most comfortable for you. Use music or television while you are exercising so that the exercises are a pleasant break in your day. This will make your life better with the exercises acting as a break in routine you can look forward to.  Lying on your back, slowly slide your foot toward your buttocks, raising your knee up off the floor. Then slowly slide your foot back down until your leg is straight again.  Lying on your back spread your legs as far apart as you can without causing discomfort.  Lying on your side, raise your upper leg and foot straight up from the floor as far as is comfortable. Slowly lower the leg and repeat.  Lying on your back, tighten up the muscle in the front of your thigh (quadriceps muscles). You can do this by keeping your leg straight and trying to raise your heel off the  floor. This helps strengthen the largest muscle supporting your knee.  Lying on your back, tighten up the muscles of your  buttocks both with the legs straight and with the knee bent at a comfortable angle while keeping your heel on the floor.      IF YOU ARE TRANSFERRED TO A SKILLED REHAB FACILITY If the patient is transferred to a skilled rehab facility following release from the hospital, a list of the current medications will be sent to the facility for the patient to continue.  When discharged from the skilled rehab facility, please have the facility set up the patient's [place] prior to being released. Also, the skilled facility will be responsible for providing the patient with their medications at time of release from the facility to include their pain medication, the muscle relaxants, and their blood thinner medication. If the patient is still at the rehab facility at time of the two week follow up appointment, the skilled rehab facility will also need to assist the patient in arranging follow up appointment in our office and any transportation needs.  MAKE SURE YOU:  Understand these instructions.  Get help right away if you are not doing well or get worse.    Pick up stool softner and laxative for home use following surgery while on pain medications. Do not submerge incision under water. Please use good hand washing techniques while changing dressing each day. May shower starting three days after surgery. Please use a clean towel to pat the incision dry following showers. Continue to use ice for pain and swelling after surgery. Do not use any lotions or creams on the incision until instructed by your surgeon.   Discharge instructions   Complete by:  As directed    Dr. [name] Total [facility] Specialist [facility] [address] [place], [place] [address] [phone]  ANTERIOR APPROACH TOTAL HIP REPLACEMENT POSTOPERATIVE  DIRECTIONS   Hip Rehabilitation, Guidelines Following Surgery  The results of a hip operation are greatly improved after range of motion and muscle strengthening exercises. Follow all safety measures which are given to protect your hip. If any of these exercises cause increased pain or swelling in your joint, decrease the amount until you are comfortable again. Then slowly increase the exercises. Call your caregiver if you have problems or questions.   HOME CARE INSTRUCTIONS  Remove items at home which could result in a fall. This includes throw rugs or furniture in walking pathways.  ICE to the affected hip every three hours for 30 minutes at a time and then as needed for pain and swelling.  Continue to use ice on the hip for pain and swelling from surgery. You may notice swelling that will progress down to the foot and ankle.  This is normal after surgery.  Elevate the leg when you are not up walking on it.   Continue to use the breathing machine which will help keep your temperature down.  It is common for your temperature to cycle up and down following surgery, especially at night when you are not up moving around and exerting yourself.  The breathing machine keeps your lungs expanded and your temperature down.  DIET You may resume your previous home diet once your are discharged from the hospital.  DRESSING / WOUND CARE / SHOWERING You may shower 3 days after surgery, but keep the wounds dry during showering.  You may use an occlusive plastic wrap (Press'n Seal for example), NO SOAKING/SUBMERGING IN THE BATHTUB.  If the bandage gets wet, change with a clean dry gauze.  If the incision gets wet, pat  the wound dry with a clean towel. You may start showering once you are discharged home but do not submerge the incision under water. Just pat the incision dry and apply a dry gauze dressing on daily. Change the surgical dressing daily and reapply a dry dressing each time.  ACTIVITY Walk with your  walker as instructed. Use walker as long as suggested by your caregivers. Avoid periods of inactivity such as sitting longer than an hour when not asleep. This helps prevent blood clots.  You may resume a sexual relationship in one month or when given the OK by your doctor.  You may return to work once you are cleared by your doctor.  Do not drive a car for 6 weeks or until released by you surgeon.  Do not drive while taking narcotics.  WEIGHT BEARING Weight bearing as tolerated with assist device (walker, cane, etc) as directed, use it as long as suggested by your surgeon or therapist, typically at least 4-6 weeks.  POSTOPERATIVE CONSTIPATION PROTOCOL Constipation - defined medically as fewer than three stools per week and severe constipation as less than one stool per week.  One of the most common issues patients have following surgery is constipation.  Even if you have a regular bowel pattern at home, your normal regimen is likely to be disrupted due to multiple reasons following surgery.  Combination of anesthesia, postoperative narcotics, change in appetite and fluid intake all can affect your bowels.  In order to avoid complications following surgery, here are some recommendations in order to help you during your recovery period.  Colace (docusate) - Pick up an over-the-counter form of Colace or another stool softener and take twice a day as long as you are requiring postoperative pain medications.  Take with a full glass of water daily.  If you experience loose stools or diarrhea, hold the colace until you stool forms back up.  If your symptoms do not get better within 1 week or if they get worse, check with your doctor.  Dulcolax (bisacodyl) - Pick up over-the-counter and take as directed by the product packaging as needed to assist with the movement of your bowels.  Take with a full glass of water.  Use this product as needed if not relieved by Colace only.   MiraLax (polyethylene  glycol) - Pick up over-the-counter to have on hand.  MiraLax is a solution that will increase the amount of water in your bowels to assist with bowel movements.  Take as directed and can mix with a glass of water, juice, soda, coffee, or tea.  Take if you go more than two days without a movement. Do not use MiraLax more than once per day. Call your doctor if you are still constipated or irregular after using this medication for 7 days in a row.  If you continue to have problems with postoperative constipation, please contact the office for further assistance and recommendations.  If you experience "the worst abdominal pain ever" or develop nausea or vomiting, please contact the office immediatly for further recommendations for treatment.  ITCHING  If you experience itching with your medications, try taking only a single pain pill, or even half a pain pill at a time.  You can also use Benadryl over the counter for itching or also to help with sleep.   TED HOSE STOCKINGS Wear the elastic stockings on both legs for three weeks following surgery during the day but you may remove then at night for sleeping.  MEDICATIONS  See your medication summary on the "After Visit Summary" that the nursing staff will review with you prior to discharge.  You may have some home medications which will be placed on hold until you complete the course of blood thinner medication.  It is important for you to complete the blood thinner medication as prescribed by your surgeon.  Continue your approved medications as instructed at time of discharge.  PRECAUTIONS If you experience chest pain or shortness of breath - call 911 immediately for transfer to the hospital emergency department.  If you develop a fever greater that 101 F, purulent drainage from wound, increased redness or drainage from wound, foul odor from the wound/dressing, or calf pain - CONTACT YOUR SURGEON.                                                    FOLLOW-UP APPOINTMENTS Make sure you keep all of your appointments after your operation with your surgeon and caregivers. You should call the office at the above phone number and make an appointment for approximately two weeks after the date of your surgery or on the date instructed by your surgeon outlined in the "After Visit Summary".  RANGE OF MOTION AND STRENGTHENING EXERCISES  These exercises are designed to help you keep full movement of your hip joint. Follow your caregiver's or physical therapist's instructions. Perform all exercises about fifteen times, three times per day or as directed. Exercise both hips, even if you have had only one joint replacement. These exercises can be done on a training (exercise) mat, on the floor, on a table or on a bed. Use whatever works the best and is most comfortable for you. Use music or television while you are exercising so that the exercises are a pleasant break in your day. This will make your life better with the exercises acting as a break in routine you can look forward to.  Lying on your back, slowly slide your foot toward your buttocks, raising your knee up off the floor. Then slowly slide your foot back down until your leg is straight again.  Lying on your back spread your legs as far apart as you can without causing discomfort.  Lying on your side, raise your upper leg and foot straight up from the floor as far as is comfortable. Slowly lower the leg and repeat.  Lying on your back, tighten up the muscle in the front of your thigh (quadriceps muscles). You can do this by keeping your leg straight and trying to raise your heel off the floor. This helps strengthen the largest muscle supporting your knee.  Lying on your back, tighten up the muscles of your buttocks both with the legs straight and with the knee bent at a comfortable angle while keeping your heel on the floor.   IF YOU ARE TRANSFERRED TO A SKILLED REHAB FACILITY If the patient is  transferred to a skilled rehab facility following release from the hospital, a list of the current medications will be sent to the facility for the patient to continue.  When discharged from the skilled rehab facility, please have the facility set up the patient's [place] prior to being released. Also, the skilled facility will be responsible for providing the patient with their medications at time of release from the facility to include their  pain medication, the muscle relaxants, and their blood thinner medication. If the patient is still at the rehab facility at time of the two week follow up appointment, the skilled rehab facility will also need to assist the patient in arranging follow up appointment in our office and any transportation needs.  MAKE SURE YOU:  Understand these instructions.  Get help right away if you are not doing well or get worse.    Pick up stool softner and laxative for home use following surgery while on pain medications. Do not submerge incision under water. Please use good hand washing techniques while changing dressing each day. May shower starting three days after surgery. Please use a clean towel to pat the incision dry following showers. Continue to use ice for pain and swelling after surgery. Do not use any lotions or creams on the incision until instructed by your surgeon.   Do not sit on low chairs, stoools or toilet seats, as it may be difficult to get up from low surfaces   Complete by:  As directed    Driving restrictions   Complete by:  As directed    No driving for two weeks   Follow the hip precautions as taught in Physical Therapy   Complete by:  As directed    Increase activity slowly as tolerated   Complete by:  As directed    TED hose   Complete by:  As directed    Use stockings (TED hose) for three weeks on both leg(s).  You may remove them at night for sleeping.   Weight bearing as tolerated   Complete by:  As directed       Allergies as of 05/13/2018      Reactions   Penicillins Hives, Rash   Has patient had a PCN reaction causing immediate rash, facial/tongue/throat swelling, SOB or lightheadedness with hypotension: Yes Has patient had a PCN reaction causing severe rash involving mucus membranes or skin necrosis: Yes Has patient had a PCN reaction that required hospitalization: No Has patient had a PCN reaction occurring within the last 10 years: No If all of the above answers are "NO", then may proceed with Cephalosporin use.   Hct [hydrochlorothiazide] Other (See Comments)   hyper   Statins Other (See Comments)   Myalgia, tolerates low dosages of lovastatin       Medication List    STOP taking these medications   warfarin 5 MG tablet Commonly known as:  COUMADIN     TAKE these medications   acetaminophen 650 MG CR tablet Commonly known as:  TYLENOL Take 650 mg by mouth every 8 (eight) hours as needed for pain.   amiodarone 400 MG tablet Commonly known as:  PACERONE Take 1 tablet (400 mg total) by mouth 2 (two) times daily.   carvedilol 12.5 MG tablet Commonly known as:  COREG TAKE 1&1/2 TABLET EVERY MORNING & TAKE 1 TABLET EVERY EVENING What changed:  See the new instructions.   diltiazem 120 MG 24 hr capsule Commonly known as:  CARDIZEM CD TAKE ONE (1) CAPSULE EACH DAY What changed:  See the new instructions.   docusate sodium 100 MG capsule Commonly known as:  COLACE Take 1 capsule (100 mg total) by mouth 2 (two) times daily.   ezetimibe 10 MG tablet Commonly known as:  ZETIA TAKE ONE (1) TABLET EACH DAY   ferrous sulfate 325 (65 FE) MG tablet Take 1 tablet (325 mg total) by mouth 2 (two) times daily after a  meal.   finasteride 5 MG tablet Commonly known as:  PROSCAR TAKE ONE (1) TABLET EACH DAY   fluticasone 50 MCG/ACT nasal spray Commonly known as:  FLONASE Place 1 spray into both nostrils daily as needed for allergies or rhinitis.   furosemide 40 MG  tablet Commonly known as:  LASIX Take 1 tablet (40 mg total) by mouth daily. What changed:    medication strength  how much to take  when to take this   lovastatin 20 MG tablet Commonly known as:  MEVACOR Take 1 tablet (20 mg total) by mouth at bedtime. What changed:    how much to take  when to take this   methocarbamol 500 MG tablet Commonly known as:  ROBAXIN Take 1 tablet (500 mg total) by mouth every 8 (eight) hours as needed for muscle spasms.   metoprolol 200 MG 24 hr tablet Commonly known as:  TOPROL-XL Take 1 tablet (200 mg total) by mouth daily.   oxyCODONE 5 MG immediate release tablet Commonly known as:  Oxy IR/ROXICODONE Take 1-2 tablets (5-10 mg total) by mouth every 6 (six) hours as needed for moderate pain (pain score 4-6).   potassium chloride SA 20 MEQ tablet Commonly known as:  K-DUR,KLOR-CON Take 1 tablet (20 mEq total) by mouth daily.   SYSTANE 0.4-0.3 % Gel ophthalmic gel Generic drug:  Polyethyl Glycol-Propyl Glycol Place 1 application into the right eye at bedtime.   SYSTANE OP Place 1 drop into both eyes daily.   Vitamin D3 5000 units Caps Take 5,000 Units by mouth every 30 (thirty) days.            Discharge Care Instructions  (From admission, onward)        Start     Ordered   05/13/18 0000  Change dressing    Comments:  Leave the dressing over the incision, we will remove this at the office.   05/13/18 1400   05/13/18 0000  Weight bearing as tolerated     05/13/18 1400     Follow-up Information    [name], MD. Schedule an appointment as soon as possible for a visit on 05/19/2018.   Specialty:  Orthopedic Surgery Contact information: [address] [place] [address] [place] [address] [phone]        [facility] - Dme Follow up.   Why:  bedside commode Contact information: [address] [place] [address] [phone]        [facility] Follow up.   Specialty:  [place] Why:  Home Health physical therapy- agency will call to arrange visit Contact information: [place] [address] [phone]        Lendon Colonel, NP Follow up on 05/25/2018.   Specialties:  Nurse Practitioner, Radiology, Cardiology Why:  Your follow up appointment will be on 05/25/18 at 0930am at the [facility] office in [place] information: [address] [place] [address] [phone]           Signed: Ardeen Jourdain, PA-C Orthopaedic Surgery 05/17/2018, 12:57 PM

## 2018-05-18 DIAGNOSIS — I6522 Occlusion and stenosis of left carotid artery: Secondary | ICD-10-CM | POA: Diagnosis not present

## 2018-05-18 DIAGNOSIS — D72829 Elevated white blood cell count, unspecified: Secondary | ICD-10-CM | POA: Diagnosis not present

## 2018-05-18 DIAGNOSIS — M1991 Primary osteoarthritis, unspecified site: Secondary | ICD-10-CM | POA: Diagnosis not present

## 2018-05-18 DIAGNOSIS — M1712 Unilateral primary osteoarthritis, left knee: Secondary | ICD-10-CM | POA: Diagnosis not present

## 2018-05-18 DIAGNOSIS — Z471 Aftercare following joint replacement surgery: Secondary | ICD-10-CM | POA: Diagnosis not present

## 2018-05-18 DIAGNOSIS — I110 Hypertensive heart disease with heart failure: Secondary | ICD-10-CM | POA: Diagnosis not present

## 2018-05-18 DIAGNOSIS — E559 Vitamin D deficiency, unspecified: Secondary | ICD-10-CM | POA: Diagnosis not present

## 2018-05-18 DIAGNOSIS — I5043 Acute on chronic combined systolic (congestive) and diastolic (congestive) heart failure: Secondary | ICD-10-CM | POA: Diagnosis not present

## 2018-05-18 DIAGNOSIS — D649 Anemia, unspecified: Secondary | ICD-10-CM | POA: Diagnosis not present

## 2018-05-18 DIAGNOSIS — I482 Chronic atrial fibrillation: Secondary | ICD-10-CM | POA: Diagnosis not present

## 2018-05-18 DIAGNOSIS — I739 Peripheral vascular disease, unspecified: Secondary | ICD-10-CM | POA: Diagnosis not present

## 2018-05-18 DIAGNOSIS — I2510 Atherosclerotic heart disease of native coronary artery without angina pectoris: Secondary | ICD-10-CM | POA: Diagnosis not present

## 2018-05-23 DIAGNOSIS — I6522 Occlusion and stenosis of left carotid artery: Secondary | ICD-10-CM | POA: Diagnosis not present

## 2018-05-23 DIAGNOSIS — I739 Peripheral vascular disease, unspecified: Secondary | ICD-10-CM | POA: Diagnosis not present

## 2018-05-23 DIAGNOSIS — Z471 Aftercare following joint replacement surgery: Secondary | ICD-10-CM | POA: Diagnosis not present

## 2018-05-23 DIAGNOSIS — I110 Hypertensive heart disease with heart failure: Secondary | ICD-10-CM | POA: Diagnosis not present

## 2018-05-23 DIAGNOSIS — D72829 Elevated white blood cell count, unspecified: Secondary | ICD-10-CM | POA: Diagnosis not present

## 2018-05-23 DIAGNOSIS — I5043 Acute on chronic combined systolic (congestive) and diastolic (congestive) heart failure: Secondary | ICD-10-CM | POA: Diagnosis not present

## 2018-05-23 DIAGNOSIS — I482 Chronic atrial fibrillation: Secondary | ICD-10-CM | POA: Diagnosis not present

## 2018-05-23 DIAGNOSIS — M1712 Unilateral primary osteoarthritis, left knee: Secondary | ICD-10-CM | POA: Diagnosis not present

## 2018-05-23 DIAGNOSIS — I2510 Atherosclerotic heart disease of native coronary artery without angina pectoris: Secondary | ICD-10-CM | POA: Diagnosis not present

## 2018-05-23 DIAGNOSIS — M1991 Primary osteoarthritis, unspecified site: Secondary | ICD-10-CM | POA: Diagnosis not present

## 2018-05-23 DIAGNOSIS — E559 Vitamin D deficiency, unspecified: Secondary | ICD-10-CM | POA: Diagnosis not present

## 2018-05-23 DIAGNOSIS — D649 Anemia, unspecified: Secondary | ICD-10-CM | POA: Diagnosis not present

## 2018-05-24 DIAGNOSIS — I482 Chronic atrial fibrillation: Secondary | ICD-10-CM | POA: Diagnosis not present

## 2018-05-24 DIAGNOSIS — I110 Hypertensive heart disease with heart failure: Secondary | ICD-10-CM | POA: Diagnosis not present

## 2018-05-24 DIAGNOSIS — I6522 Occlusion and stenosis of left carotid artery: Secondary | ICD-10-CM | POA: Diagnosis not present

## 2018-05-24 DIAGNOSIS — D649 Anemia, unspecified: Secondary | ICD-10-CM | POA: Diagnosis not present

## 2018-05-24 DIAGNOSIS — Z471 Aftercare following joint replacement surgery: Secondary | ICD-10-CM | POA: Diagnosis not present

## 2018-05-24 DIAGNOSIS — M1991 Primary osteoarthritis, unspecified site: Secondary | ICD-10-CM | POA: Diagnosis not present

## 2018-05-24 DIAGNOSIS — D72829 Elevated white blood cell count, unspecified: Secondary | ICD-10-CM | POA: Diagnosis not present

## 2018-05-24 DIAGNOSIS — M1712 Unilateral primary osteoarthritis, left knee: Secondary | ICD-10-CM | POA: Diagnosis not present

## 2018-05-24 DIAGNOSIS — I739 Peripheral vascular disease, unspecified: Secondary | ICD-10-CM | POA: Diagnosis not present

## 2018-05-24 DIAGNOSIS — E559 Vitamin D deficiency, unspecified: Secondary | ICD-10-CM | POA: Diagnosis not present

## 2018-05-24 DIAGNOSIS — I5043 Acute on chronic combined systolic (congestive) and diastolic (congestive) heart failure: Secondary | ICD-10-CM | POA: Diagnosis not present

## 2018-05-24 DIAGNOSIS — I2510 Atherosclerotic heart disease of native coronary artery without angina pectoris: Secondary | ICD-10-CM | POA: Diagnosis not present

## 2018-05-25 ENCOUNTER — Ambulatory Visit: Payer: Medicare Other | Admitting: Adult Health

## 2018-05-25 ENCOUNTER — Telehealth: Payer: Self-pay | Admitting: Cardiology

## 2018-05-25 ENCOUNTER — Ambulatory Visit: Payer: Medicare Other | Admitting: Physician Assistant

## 2018-05-25 NOTE — Telephone Encounter (Signed)
No warfarin on current medication list. Prior anticoagulation clinic was with [facility].  Okay to take amiodarone and coumadin if needed but will need close monitoring due to drug-drug interaction.

## 2018-05-25 NOTE — Telephone Encounter (Signed)
New Message     Pt c/o medication issue:  1. Name of Medication: Coumadin and Amiodarone  2. How are you currently taking this medication (dosage and times per day)? Once daily  3. Are you having a reaction (difficulty breathing--STAT)? No  4. What is your medication issue? Drug to Drug reaction. Patient has not had a reaction. Patient was prescribe this while in the hospital. Pls advise.

## 2018-05-26 DIAGNOSIS — M1991 Primary osteoarthritis, unspecified site: Secondary | ICD-10-CM | POA: Diagnosis not present

## 2018-05-26 DIAGNOSIS — D72829 Elevated white blood cell count, unspecified: Secondary | ICD-10-CM | POA: Diagnosis not present

## 2018-05-26 DIAGNOSIS — D649 Anemia, unspecified: Secondary | ICD-10-CM | POA: Diagnosis not present

## 2018-05-26 DIAGNOSIS — E559 Vitamin D deficiency, unspecified: Secondary | ICD-10-CM | POA: Diagnosis not present

## 2018-05-26 DIAGNOSIS — I739 Peripheral vascular disease, unspecified: Secondary | ICD-10-CM | POA: Diagnosis not present

## 2018-05-26 DIAGNOSIS — I2510 Atherosclerotic heart disease of native coronary artery without angina pectoris: Secondary | ICD-10-CM | POA: Diagnosis not present

## 2018-05-26 DIAGNOSIS — I6522 Occlusion and stenosis of left carotid artery: Secondary | ICD-10-CM | POA: Diagnosis not present

## 2018-05-26 DIAGNOSIS — Z471 Aftercare following joint replacement surgery: Secondary | ICD-10-CM | POA: Diagnosis not present

## 2018-05-26 DIAGNOSIS — I5043 Acute on chronic combined systolic (congestive) and diastolic (congestive) heart failure: Secondary | ICD-10-CM | POA: Diagnosis not present

## 2018-05-26 DIAGNOSIS — I110 Hypertensive heart disease with heart failure: Secondary | ICD-10-CM | POA: Diagnosis not present

## 2018-05-26 DIAGNOSIS — M1712 Unilateral primary osteoarthritis, left knee: Secondary | ICD-10-CM | POA: Diagnosis not present

## 2018-05-26 DIAGNOSIS — I482 Chronic atrial fibrillation: Secondary | ICD-10-CM | POA: Diagnosis not present

## 2018-05-30 DIAGNOSIS — Z96649 Presence of unspecified artificial hip joint: Secondary | ICD-10-CM | POA: Diagnosis not present

## 2018-05-31 DIAGNOSIS — I6522 Occlusion and stenosis of left carotid artery: Secondary | ICD-10-CM | POA: Diagnosis not present

## 2018-05-31 DIAGNOSIS — I5043 Acute on chronic combined systolic (congestive) and diastolic (congestive) heart failure: Secondary | ICD-10-CM | POA: Diagnosis not present

## 2018-05-31 DIAGNOSIS — I110 Hypertensive heart disease with heart failure: Secondary | ICD-10-CM | POA: Diagnosis not present

## 2018-05-31 DIAGNOSIS — I739 Peripheral vascular disease, unspecified: Secondary | ICD-10-CM | POA: Diagnosis not present

## 2018-05-31 DIAGNOSIS — D649 Anemia, unspecified: Secondary | ICD-10-CM | POA: Diagnosis not present

## 2018-05-31 DIAGNOSIS — I482 Chronic atrial fibrillation: Secondary | ICD-10-CM | POA: Diagnosis not present

## 2018-05-31 DIAGNOSIS — M1712 Unilateral primary osteoarthritis, left knee: Secondary | ICD-10-CM | POA: Diagnosis not present

## 2018-05-31 DIAGNOSIS — D72829 Elevated white blood cell count, unspecified: Secondary | ICD-10-CM | POA: Diagnosis not present

## 2018-05-31 DIAGNOSIS — I2510 Atherosclerotic heart disease of native coronary artery without angina pectoris: Secondary | ICD-10-CM | POA: Diagnosis not present

## 2018-05-31 DIAGNOSIS — M1991 Primary osteoarthritis, unspecified site: Secondary | ICD-10-CM | POA: Diagnosis not present

## 2018-05-31 DIAGNOSIS — E559 Vitamin D deficiency, unspecified: Secondary | ICD-10-CM | POA: Diagnosis not present

## 2018-05-31 DIAGNOSIS — Z471 Aftercare following joint replacement surgery: Secondary | ICD-10-CM | POA: Diagnosis not present

## 2018-06-01 ENCOUNTER — Other Ambulatory Visit: Payer: Medicare Other

## 2018-06-01 DIAGNOSIS — Z7689 Persons encountering health services in other specified circumstances: Secondary | ICD-10-CM | POA: Diagnosis not present

## 2018-06-02 DIAGNOSIS — I482 Chronic atrial fibrillation: Secondary | ICD-10-CM | POA: Diagnosis not present

## 2018-06-02 DIAGNOSIS — Z471 Aftercare following joint replacement surgery: Secondary | ICD-10-CM | POA: Diagnosis not present

## 2018-06-02 DIAGNOSIS — D649 Anemia, unspecified: Secondary | ICD-10-CM | POA: Diagnosis not present

## 2018-06-02 DIAGNOSIS — I739 Peripheral vascular disease, unspecified: Secondary | ICD-10-CM | POA: Diagnosis not present

## 2018-06-02 DIAGNOSIS — E559 Vitamin D deficiency, unspecified: Secondary | ICD-10-CM | POA: Diagnosis not present

## 2018-06-02 DIAGNOSIS — I5043 Acute on chronic combined systolic (congestive) and diastolic (congestive) heart failure: Secondary | ICD-10-CM | POA: Diagnosis not present

## 2018-06-02 DIAGNOSIS — M1712 Unilateral primary osteoarthritis, left knee: Secondary | ICD-10-CM | POA: Diagnosis not present

## 2018-06-02 DIAGNOSIS — M1991 Primary osteoarthritis, unspecified site: Secondary | ICD-10-CM | POA: Diagnosis not present

## 2018-06-02 DIAGNOSIS — D72829 Elevated white blood cell count, unspecified: Secondary | ICD-10-CM | POA: Diagnosis not present

## 2018-06-02 DIAGNOSIS — I110 Hypertensive heart disease with heart failure: Secondary | ICD-10-CM | POA: Diagnosis not present

## 2018-06-02 DIAGNOSIS — I2510 Atherosclerotic heart disease of native coronary artery without angina pectoris: Secondary | ICD-10-CM | POA: Diagnosis not present

## 2018-06-02 DIAGNOSIS — I6522 Occlusion and stenosis of left carotid artery: Secondary | ICD-10-CM | POA: Diagnosis not present

## 2018-06-03 DIAGNOSIS — M1991 Primary osteoarthritis, unspecified site: Secondary | ICD-10-CM | POA: Diagnosis not present

## 2018-06-03 DIAGNOSIS — E559 Vitamin D deficiency, unspecified: Secondary | ICD-10-CM | POA: Diagnosis not present

## 2018-06-03 DIAGNOSIS — I6522 Occlusion and stenosis of left carotid artery: Secondary | ICD-10-CM | POA: Diagnosis not present

## 2018-06-03 DIAGNOSIS — M1712 Unilateral primary osteoarthritis, left knee: Secondary | ICD-10-CM | POA: Diagnosis not present

## 2018-06-03 DIAGNOSIS — Z471 Aftercare following joint replacement surgery: Secondary | ICD-10-CM | POA: Diagnosis not present

## 2018-06-03 DIAGNOSIS — I2510 Atherosclerotic heart disease of native coronary artery without angina pectoris: Secondary | ICD-10-CM | POA: Diagnosis not present

## 2018-06-03 DIAGNOSIS — D72829 Elevated white blood cell count, unspecified: Secondary | ICD-10-CM | POA: Diagnosis not present

## 2018-06-03 DIAGNOSIS — I110 Hypertensive heart disease with heart failure: Secondary | ICD-10-CM | POA: Diagnosis not present

## 2018-06-03 DIAGNOSIS — D649 Anemia, unspecified: Secondary | ICD-10-CM | POA: Diagnosis not present

## 2018-06-03 DIAGNOSIS — I482 Chronic atrial fibrillation: Secondary | ICD-10-CM | POA: Diagnosis not present

## 2018-06-03 DIAGNOSIS — I739 Peripheral vascular disease, unspecified: Secondary | ICD-10-CM | POA: Diagnosis not present

## 2018-06-03 DIAGNOSIS — I5043 Acute on chronic combined systolic (congestive) and diastolic (congestive) heart failure: Secondary | ICD-10-CM | POA: Diagnosis not present

## 2018-06-07 DIAGNOSIS — I6522 Occlusion and stenosis of left carotid artery: Secondary | ICD-10-CM | POA: Diagnosis not present

## 2018-06-07 DIAGNOSIS — D72829 Elevated white blood cell count, unspecified: Secondary | ICD-10-CM | POA: Diagnosis not present

## 2018-06-07 DIAGNOSIS — Z471 Aftercare following joint replacement surgery: Secondary | ICD-10-CM | POA: Diagnosis not present

## 2018-06-07 DIAGNOSIS — I739 Peripheral vascular disease, unspecified: Secondary | ICD-10-CM | POA: Diagnosis not present

## 2018-06-07 DIAGNOSIS — I5043 Acute on chronic combined systolic (congestive) and diastolic (congestive) heart failure: Secondary | ICD-10-CM | POA: Diagnosis not present

## 2018-06-07 DIAGNOSIS — I2510 Atherosclerotic heart disease of native coronary artery without angina pectoris: Secondary | ICD-10-CM | POA: Diagnosis not present

## 2018-06-07 DIAGNOSIS — M1712 Unilateral primary osteoarthritis, left knee: Secondary | ICD-10-CM | POA: Diagnosis not present

## 2018-06-07 DIAGNOSIS — I482 Chronic atrial fibrillation: Secondary | ICD-10-CM | POA: Diagnosis not present

## 2018-06-07 DIAGNOSIS — D649 Anemia, unspecified: Secondary | ICD-10-CM | POA: Diagnosis not present

## 2018-06-07 DIAGNOSIS — I110 Hypertensive heart disease with heart failure: Secondary | ICD-10-CM | POA: Diagnosis not present

## 2018-06-07 DIAGNOSIS — E559 Vitamin D deficiency, unspecified: Secondary | ICD-10-CM | POA: Diagnosis not present

## 2018-06-07 DIAGNOSIS — M1991 Primary osteoarthritis, unspecified site: Secondary | ICD-10-CM | POA: Diagnosis not present

## 2018-06-10 DIAGNOSIS — I6522 Occlusion and stenosis of left carotid artery: Secondary | ICD-10-CM | POA: Diagnosis not present

## 2018-06-10 DIAGNOSIS — I482 Chronic atrial fibrillation: Secondary | ICD-10-CM | POA: Diagnosis not present

## 2018-06-10 DIAGNOSIS — I739 Peripheral vascular disease, unspecified: Secondary | ICD-10-CM | POA: Diagnosis not present

## 2018-06-10 DIAGNOSIS — E559 Vitamin D deficiency, unspecified: Secondary | ICD-10-CM | POA: Diagnosis not present

## 2018-06-10 DIAGNOSIS — D72829 Elevated white blood cell count, unspecified: Secondary | ICD-10-CM | POA: Diagnosis not present

## 2018-06-10 DIAGNOSIS — Z471 Aftercare following joint replacement surgery: Secondary | ICD-10-CM | POA: Diagnosis not present

## 2018-06-10 DIAGNOSIS — D649 Anemia, unspecified: Secondary | ICD-10-CM | POA: Diagnosis not present

## 2018-06-10 DIAGNOSIS — I110 Hypertensive heart disease with heart failure: Secondary | ICD-10-CM | POA: Diagnosis not present

## 2018-06-10 DIAGNOSIS — M1991 Primary osteoarthritis, unspecified site: Secondary | ICD-10-CM | POA: Diagnosis not present

## 2018-06-10 DIAGNOSIS — I2510 Atherosclerotic heart disease of native coronary artery without angina pectoris: Secondary | ICD-10-CM | POA: Diagnosis not present

## 2018-06-10 DIAGNOSIS — I5043 Acute on chronic combined systolic (congestive) and diastolic (congestive) heart failure: Secondary | ICD-10-CM | POA: Diagnosis not present

## 2018-06-10 DIAGNOSIS — M1712 Unilateral primary osteoarthritis, left knee: Secondary | ICD-10-CM | POA: Diagnosis not present

## 2018-06-15 DIAGNOSIS — I739 Peripheral vascular disease, unspecified: Secondary | ICD-10-CM | POA: Diagnosis not present

## 2018-06-15 DIAGNOSIS — E559 Vitamin D deficiency, unspecified: Secondary | ICD-10-CM | POA: Diagnosis not present

## 2018-06-15 DIAGNOSIS — M1991 Primary osteoarthritis, unspecified site: Secondary | ICD-10-CM | POA: Diagnosis not present

## 2018-06-15 DIAGNOSIS — Z471 Aftercare following joint replacement surgery: Secondary | ICD-10-CM | POA: Diagnosis not present

## 2018-06-15 DIAGNOSIS — I110 Hypertensive heart disease with heart failure: Secondary | ICD-10-CM | POA: Diagnosis not present

## 2018-06-15 DIAGNOSIS — D72829 Elevated white blood cell count, unspecified: Secondary | ICD-10-CM | POA: Diagnosis not present

## 2018-06-15 DIAGNOSIS — I6522 Occlusion and stenosis of left carotid artery: Secondary | ICD-10-CM | POA: Diagnosis not present

## 2018-06-15 DIAGNOSIS — I2510 Atherosclerotic heart disease of native coronary artery without angina pectoris: Secondary | ICD-10-CM | POA: Diagnosis not present

## 2018-06-15 DIAGNOSIS — M1712 Unilateral primary osteoarthritis, left knee: Secondary | ICD-10-CM | POA: Diagnosis not present

## 2018-06-15 DIAGNOSIS — D649 Anemia, unspecified: Secondary | ICD-10-CM | POA: Diagnosis not present

## 2018-06-15 DIAGNOSIS — I482 Chronic atrial fibrillation: Secondary | ICD-10-CM | POA: Diagnosis not present

## 2018-06-15 DIAGNOSIS — I5043 Acute on chronic combined systolic (congestive) and diastolic (congestive) heart failure: Secondary | ICD-10-CM | POA: Diagnosis not present

## 2018-06-21 NOTE — Progress Notes (Signed)
HPI The patient presents for followup of his coronary disease and atrial fibrillation.   Lexiscan Myoview in June 2018 demonstrated infarct but no ischemia.  EF on echo was 40 - 45%.  This was done to evaluate chest pain.  He returns for follow up.    Since I last saw him he had hip surgery.  I reviewed these records.  He had a complicated course because he had anemia and he actually got 4 units of blood.  He had some acute on chronic systolic and diastolic heart failure.  He had atrial fibrillation with rapid rate.  He was treated with amiodarone as well as the addition of metoprolol XL.  I reviewed the records and see that he was sent home both on carvedilol and metoprolol XL.  His wife said he did not feel well when he went home.  He was lightheaded.  Heart rate was down to the 60s.  He was also sent home on amiodarone and she took it upon herself to reduce this from 400 mg twice a day to once a day.  She came in today with him because she wanted to clarify his medical regimen.  He actually has done physical therapy.  He is walking and he feels relatively well.  He denies any chest pressure, neck or arm discomfort.  He is had no palpitations, presyncope or syncope.  He has no weight gain or edema.   Allergies  Allergen Reactions  . Penicillins Hives and Rash    Has patient had a PCN reaction causing immediate rash, facial/tongue/throat swelling, SOB or lightheadedness with hypotension: Yes Has patient had a PCN reaction causing severe rash involving mucus membranes or skin necrosis: Yes Has patient had a PCN reaction that required hospitalization: No Has patient had a PCN reaction occurring within the last 10 years: No If all of the above answers are "NO", then may proceed with Cephalosporin use.   . Hct [Hydrochlorothiazide] Other (See Comments)    hyper  . Statins Other (See Comments)    Myalgia, tolerates low dosages of lovastatin     Current Outpatient Medications  Medication Sig  Dispense Refill  . acetaminophen (TYLENOL) 650 MG CR tablet Take 650 mg by mouth every 8 (eight) hours as needed for pain.     Marland Kitchen amiodarone (PACERONE) 200 MG tablet Take 1 tablet (200 mg total) by mouth daily.    . Cholecalciferol (VITAMIN D3) 5000 units CAPS Take 5,000 Units by mouth every 30 (thirty) days.    Marland Kitchen diltiazem (CARDIZEM CD) 120 MG 24 hr capsule TAKE ONE (1) CAPSULE EACH DAY (Patient taking differently: Take 120 mg by mouth once daily at night) 90 capsule 0  . docusate sodium (COLACE) 100 MG capsule Take 1 capsule (100 mg total) by mouth 2 (two) times daily. 10 capsule 0  . ezetimibe (ZETIA) 10 MG tablet TAKE ONE (1) TABLET EACH DAY 90 tablet 0  . finasteride (PROSCAR) 5 MG tablet TAKE ONE (1) TABLET EACH DAY 90 tablet 0  . furosemide (LASIX) 40 MG tablet Take 1 tablet (40 mg total) by mouth daily. (Patient taking differently: 40 mg. Take 40mg  by mouth every other day and 20mg  by mouth every other day) 60 tablet 4  . lovastatin (MEVACOR) 20 MG tablet Take 1 tablet (20 mg total) by mouth at bedtime. (Patient taking differently: Take 20 mg by mouth 3 (three) times a week. ) 90 tablet 1  . metoprolol (TOPROL-XL) 200 MG 24 hr tablet  Take 1 tablet (200 mg total) by mouth daily. 60 tablet 4  . Polyethyl Glycol-Propyl Glycol (SYSTANE OP) Place 1 drop into both eyes daily.    . fluticasone (FLONASE) 50 MCG/ACT nasal spray Place 1 spray into both nostrils daily as needed for allergies or rhinitis.    Marland Kitchen warfarin (COUMADIN) 5 MG tablet warfarin 5 mg tablet     No current facility-administered medications for this visit.     Past Medical History:  Diagnosis Date  . Anemia   . Atrial fibrillation ([place])   . Cataract   . Cellulitis    In past  . Complication of anesthesia    HARD TIME WAKING; CAUSES MY BP TO GO UP   . Coronary artery disease    5 bypasses  . DJD (degenerative joint disease)   . Ejection fraction < 50%    Mildly reduced, 40% by echo  . GERD (gastroesophageal reflux  disease)   . Hyperlipidemia   . Hypertension   . Persistent atrial fibrillation ([place])   . Prostate hypertrophy    on CT scan 09/2014  . PUD (peptic ulcer disease)    RESOLVED   . Skin cancer of eyelid    Resected    Past Surgical History:  Procedure Laterality Date  . APPENDECTOMY    . BYPASS GRAFT  2005  . CATARACT EXTRACTION W/PHACO Left 07/22/2015   Procedure: CATARACT EXTRACTION PHACO AND INTRAOCULAR LENS PLACEMENT LEFT EYE CDE=8.14;  Surgeon: Tonny Branch, MD;  Location: [facility];  Service: Ophthalmology;  Laterality: Left;  . CORONARY ARTERY BYPASS GRAFT  4/05   LIMA to LAD, SVG to PDA, SVG to ramus intermediate  . EYE SURGERY  07/2015  . EYELID CARCINOMA EXCISION     Skin cancer resected  . Heart bypass  2005  . TOTAL HIP ARTHROPLASTY     Right  . TOTAL HIP ARTHROPLASTY Left 05/04/2018   Procedure: LEFT TOTAL HIP ARTHROPLASTY;  Surgeon: [name], MD;  Location: [facility];  Service: Orthopedics;  Laterality: Left;  . TOTAL KNEE ARTHROPLASTY     Right     ROS:  As stated in the HPI and negative for all other systems.;   PHYSICAL EXAM BP 124/86   Pulse 76   Ht 5' 7.5" (1.715 m)   Wt 183 lb (83 kg)   BMI 28.24 kg/m   GENERAL:  Well appearing NECK:  No jugular venous distention, waveform within normal limits, carotid upstroke brisk and symmetric, no bruits, no thyromegaly LUNGS:  Clear to auscultation bilaterally CHEST:  Unremarkable HEART:  PMI not displaced or sustained,S1 and S2 within normal limits, no S3, , no clicks, no rubs, irregular, no murmurs ABD:  Flat, positive bowel sounds normal in frequency in pitch, no bruits, no rebound, no guarding, no midline pulsatile mass, no hepatomegaly, no splenomegaly EXT:  2 plus pulses throughout, mild left ankle edema, no cyanosis no clubbing   EKG:  NA  Lab Results  Component Value Date   CHOL 184 03/15/2018   TRIG 127 03/15/2018   HDL 49 03/15/2018   LDLCALC 110 (H) 03/15/2018    ASSESSMENT AND PLAN  [name]:   The patient has no new sypmtoms.  No further cardiovascular testing is indicated.  We will continue with aggressive risk reduction and meds as listed.  CM:  He has a mildly reduced EF.   No change in therapy.   HTN:  The blood pressure is controlled.  I will make changes as above.  HYPERLIPIDEMIA:   Lipids as above.  He will continue meds as listed and I will follow up to see that he is at target LDL .   ATRIAL FIBRILLATION:   Mr. ROLONDO PIERRE has a CHA2DS2 - VASc score of 4.    I will take him off the carvedilol and leave him on metoprolol.  And then reduce the amiodarone to 200 mg once daily.  Unlikely that this at the next visit provided his heart rates well controlled.  He will continue with his anticoagulation.   CAROTID STENOSIS:  He had a Doppler this year with less than 50% left ICA stenosis.   I will consider repeating this at the next visit.    CHRONIC SYSTOLIC AND DIASTOLIC HF:  He seems to be euvolemic.  No change in therapy.

## 2018-06-22 ENCOUNTER — Ambulatory Visit: Payer: Medicare Other | Admitting: Cardiology

## 2018-06-22 ENCOUNTER — Encounter: Payer: Self-pay | Admitting: Cardiology

## 2018-06-22 VITALS — BP 124/86 | HR 76 | Ht 67.5 in | Wt 183.0 lb

## 2018-06-22 DIAGNOSIS — E785 Hyperlipidemia, unspecified: Secondary | ICD-10-CM

## 2018-06-22 DIAGNOSIS — I10 Essential (primary) hypertension: Secondary | ICD-10-CM | POA: Diagnosis not present

## 2018-06-22 DIAGNOSIS — I6522 Occlusion and stenosis of left carotid artery: Secondary | ICD-10-CM

## 2018-06-22 DIAGNOSIS — I482 Chronic atrial fibrillation, unspecified: Secondary | ICD-10-CM

## 2018-06-22 MED ORDER — AMIODARONE HCL 200 MG PO TABS: 200.0000 mg | ORAL_TABLET | Freq: Every day | ORAL | Status: DC

## 2018-06-22 NOTE — Patient Instructions (Signed)
Medication Instructions:  Please discontinue your Carvedilol. Decrease Amiodarone to 200 mg a day. Continue all other medications as listed.  Follow-Up: Follow up in 2 months with Dr Percival Spanish.  If you need a refill on your cardiac medications before your next appointment, please call your pharmacy.  Thank you for choosing [facility]!!

## 2018-07-07 ENCOUNTER — Other Ambulatory Visit: Payer: Medicare Other

## 2018-07-07 ENCOUNTER — Encounter: Payer: Self-pay | Admitting: Nurse Practitioner

## 2018-07-07 ENCOUNTER — Ambulatory Visit (INDEPENDENT_AMBULATORY_CARE_PROVIDER_SITE_OTHER): Payer: Medicare Other | Admitting: Nurse Practitioner

## 2018-07-07 VITALS — BP 124/74 | HR 78 | Temp 97.8°F | Ht 67.5 in

## 2018-07-07 DIAGNOSIS — I4819 Other persistent atrial fibrillation: Secondary | ICD-10-CM

## 2018-07-07 DIAGNOSIS — R791 Abnormal coagulation profile: Secondary | ICD-10-CM | POA: Diagnosis not present

## 2018-07-07 DIAGNOSIS — I481 Persistent atrial fibrillation: Secondary | ICD-10-CM

## 2018-07-07 DIAGNOSIS — Z7901 Long term (current) use of anticoagulants: Secondary | ICD-10-CM

## 2018-07-07 LAB — COAGUCHEK XS/INR WAIVED: INR: >8 (ref 0.9–1.1)

## 2018-07-07 LAB — HEMOGLOBIN, FINGERSTICK: Hemoglobin: 12.8 g/dL (ref 12.6–17.7)

## 2018-07-07 MED ORDER — VITAMIN K1 10 MG/ML IJ SOLN
2.5000 mg | Freq: Once | INTRAMUSCULAR | Status: AC
  Administered 2018-07-07: 2.5 mg via SUBCUTANEOUS

## 2018-07-07 NOTE — Progress Notes (Addendum)
Subjective:   Chief Complaint: INR recheck  * patient is on coumadin for chronic atrial fib. He had to stop taking for his total hip replacement in July 2019. He was seen for last INR check on august 2nd and it was 1.18. He has not had INR checked since then.    Indication: atrial fibrillation Bleeding signs/symptoms: None Thromboembolic signs/symptoms: None  Missed Coumadin doses: None Medication changes: no Dietary changes: no Bacterial/viral infection: no Other concerns: no  The following portions of the patient's history were reviewed and updated as appropriate: allergies, current medications, past family history, past medical history, past social history, past surgical history and problem list.  Review of Systems Pertinent items noted in HPI and remainder of comprehensive ROS otherwise negative.   Objective:    INR Today: 8.0 Current dose: coumadin 5mg  daily   Assessment:    Supratherapeutic INR for goal of 2-3   Plan:    1. New dose: vitamin k 2.5mg  today, hold coumadin dose today  - do nnot take dose tomorrow until seen in office. 2. Next INR: 1 day  Mary-Margaret Hassell Done, FNP

## 2018-07-07 NOTE — Patient Instructions (Signed)
Phytonadione, Vitamin K1 injection What is this medicine? PHYTONADIONE (fye toe na DYE one) is a man-made form of vitamin K. This medicine is used to treat vitamin K deficiency or bleeding problems caused by various disorders. This medicine is also given to newborn babies to prevent bleeding. This medicine may be used for other purposes; ask your health care provider or pharmacist if you have questions. COMMON BRAND NAME(S): AquaMEPHYTON What should I tell my health care provider before I take this medicine? They need to know if you have any of these conditions: -liver disease -an unusual or allergic reaction to phytonadione, other medicine, foods, dyes, or preservatives -pregnant or trying to get pregnant -breast-feeding How should I use this medicine? This medicine is for injection under the skin, into a muscle, or rarely, into a vein. It is usually given by a health care professional in a hospital or clinic setting. In newborn babies, this medicine is injected into the muscle as a one-time dose shortly after they are born. If you get this medicine at home, you will be taught how to prepare and give this medicine. Use exactly as directed. Take your medicine at regular intervals. Do not take your medicine more often than directed. It is important that you put your used needles and syringes in a special sharps container. Do not put them in a trash can. If you do not have a sharps container, call your pharmacist or healthcare provider to get one. Talk to your pediatrician regarding the use of this medicine in children. While this drug may be prescribed for children as young as newborns for selected conditions, precautions do apply. Overdosage: If you think you have taken too much of this medicine contact a poison control center or emergency room at once. NOTE: This medicine is only for you. Do not share this medicine with others. What if I miss a dose? If you miss a dose, take it as soon as you  can. If it is almost time for your next dose, take only that dose. Do not take double or extra doses. What may interact with this medicine? -medicines that treat or prevent blood clots like warfarin This list may not describe all possible interactions. Give your health care provider a list of all the medicines, herbs, non-prescription drugs, or dietary supplements you use. Also tell them if you smoke, drink alcohol, or use illegal drugs. Some items may interact with your medicine. What should I watch for while using this medicine? Visit your doctor or health care professional for regular checks on your progress. Your doctor or health care professional will schedule tests to make sure the medicine is working properly. What side effects may I notice from receiving this medicine? Side effects that you should report to your doctor or health care professional as soon as possible: -allergic reactions like skin rash, itching or hives, swelling of the face, lips, or tongue -bluish discoloration of lips, fingernails, or palms of hands -breathing problems -increased sweating -yellowing of the eyes or skin when this medicine is given to newborn babies Side effects that usually do not require medical attention (report to your doctor or health care professional if they continue or are bothersome): -changes in taste -dizziness -flushing of the face -pain, inflammation, or swelling at site where injected This list may not describe all possible side effects. Call your doctor for medical advice about side effects. You may report side effects to FDA at 1-800-FDA-1088. Where should I keep my medicine? Keep out of  the reach of children. Store at room temperature between 15 and 30 degrees C (59 and 86 degrees F). Do not freeze. Protect from light. Store in tightly closed container and original carton until contents have been used. Throw away any unused medicine after the expiration date. NOTE: This sheet is a  summary. It may not cover all possible information. If you have questions about this medicine, talk to your doctor, pharmacist, or health care provider.  2018 Elsevier/Gold Standard (2009-01-07 15:26:08)

## 2018-07-07 NOTE — Addendum Note (Signed)
Addended by: Chevis Pretty on: 07/07/2018 11:46 AM   Modules accepted: Orders

## 2018-07-07 NOTE — Addendum Note (Signed)
Addended by: Marylin Crosby on: 07/07/2018 02:53 PM   Modules accepted: Orders

## 2018-07-07 NOTE — Addendum Note (Signed)
Addended by: Marylin Crosby on: 07/07/2018 02:26 PM   Modules accepted: Orders

## 2018-07-08 ENCOUNTER — Other Ambulatory Visit: Payer: Self-pay | Admitting: Family Medicine

## 2018-07-08 ENCOUNTER — Encounter: Payer: Self-pay | Admitting: Nurse Practitioner

## 2018-07-08 ENCOUNTER — Ambulatory Visit (INDEPENDENT_AMBULATORY_CARE_PROVIDER_SITE_OTHER): Payer: Medicare Other | Admitting: Nurse Practitioner

## 2018-07-08 DIAGNOSIS — Z7901 Long term (current) use of anticoagulants: Secondary | ICD-10-CM

## 2018-07-08 DIAGNOSIS — I481 Persistent atrial fibrillation: Secondary | ICD-10-CM

## 2018-07-08 DIAGNOSIS — Z23 Encounter for immunization: Secondary | ICD-10-CM

## 2018-07-08 DIAGNOSIS — I4819 Other persistent atrial fibrillation: Secondary | ICD-10-CM

## 2018-07-08 LAB — PROTIME-INR
INR: 9.8 — AB (ref 0.8–1.2)
Prothrombin Time: 82.6 s — ABNORMAL HIGH (ref 9.1–12.0)

## 2018-07-08 LAB — COAGUCHEK XS/INR WAIVED
INR: 4.7 — ABNORMAL HIGH (ref 0.9–1.1)
Prothrombin Time: 56.8 s

## 2018-07-08 NOTE — Progress Notes (Signed)
Subjective:   hief Complaint;  INR yesterday was 8.0- he was given 2.5mg  of vitamin k and was to hold coumadin dose last night. He is here today for recheck.   Indication: atrial fibrillation Bleeding signs/symptoms: None Thromboembolic signs/symptoms: None  Missed Coumadin doses: None Medication changes: no Dietary changes: no Bacterial/viral infection: no Other concerns: no  The following portions of the patient's history were reviewed and updated as appropriate: allergies, current medications, past family history, past medical history, past social history, past surgical history and problem list.  Review of Systems Pertinent items noted in HPI and remainder of comprehensive ROS otherwise negative.   Objective:    INR Today: 4.7 Current dose: coumadin 5mg  daily- hold yesterday    Assessment:    Supratherapeutic INR for goal of 2-3   Plan:    1. New dose: hold today then coumadin 5mg  daily except 2.5mg  on M,W and F   2. Next INR: 2 weeks     Mary-Margaret Hassell Done, FNP

## 2018-07-08 NOTE — Addendum Note (Signed)
Addended by: Wardell Heath on: 07/08/2018 10:47 AM   Modules accepted: Orders

## 2018-07-28 ENCOUNTER — Ambulatory Visit (INDEPENDENT_AMBULATORY_CARE_PROVIDER_SITE_OTHER): Payer: Medicare Other | Admitting: Family Medicine

## 2018-07-28 ENCOUNTER — Encounter: Payer: Self-pay | Admitting: Family Medicine

## 2018-07-28 VITALS — BP 151/87 | HR 74 | Temp 97.7°F | Ht 67.5 in | Wt 186.0 lb

## 2018-07-28 DIAGNOSIS — Z96642 Presence of left artificial hip joint: Secondary | ICD-10-CM

## 2018-07-28 DIAGNOSIS — I700 Atherosclerosis of aorta: Secondary | ICD-10-CM | POA: Diagnosis not present

## 2018-07-28 DIAGNOSIS — E559 Vitamin D deficiency, unspecified: Secondary | ICD-10-CM | POA: Diagnosis not present

## 2018-07-28 DIAGNOSIS — I4819 Other persistent atrial fibrillation: Secondary | ICD-10-CM

## 2018-07-28 DIAGNOSIS — E7849 Other hyperlipidemia: Secondary | ICD-10-CM

## 2018-07-28 DIAGNOSIS — I10 Essential (primary) hypertension: Secondary | ICD-10-CM | POA: Diagnosis not present

## 2018-07-28 DIAGNOSIS — Z7901 Long term (current) use of anticoagulants: Secondary | ICD-10-CM

## 2018-07-28 DIAGNOSIS — K219 Gastro-esophageal reflux disease without esophagitis: Secondary | ICD-10-CM

## 2018-07-28 LAB — COAGUCHEK XS/INR WAIVED
INR: 2.3 — AB (ref 0.9–1.1)
Prothrombin Time: 27.9 s

## 2018-07-28 LAB — CBC WITH DIFFERENTIAL/PLATELET
BASOS ABS: 0.1 10*3/uL (ref 0.0–0.2)
Basos: 1 %
EOS (ABSOLUTE): 0.4 10*3/uL (ref 0.0–0.4)
Eos: 3 %
Hematocrit: 36.7 % — ABNORMAL LOW (ref 37.5–51.0)
Hemoglobin: 12 g/dL — ABNORMAL LOW (ref 13.0–17.7)
IMMATURE GRANS (ABS): 0.1 10*3/uL (ref 0.0–0.1)
IMMATURE GRANULOCYTES: 1 %
LYMPHS: 33 %
Lymphocytes Absolute: 4.1 10*3/uL — ABNORMAL HIGH (ref 0.7–3.1)
MCH: 32.6 pg (ref 26.6–33.0)
MCHC: 32.7 g/dL (ref 31.5–35.7)
MCV: 100 fL — ABNORMAL HIGH (ref 79–97)
Monocytes Absolute: 0.8 10*3/uL (ref 0.1–0.9)
Monocytes: 6 %
NEUTROS PCT: 56 %
Neutrophils Absolute: 7 10*3/uL (ref 1.4–7.0)
PLATELETS: 402 10*3/uL (ref 150–450)
RBC: 3.68 x10E6/uL — ABNORMAL LOW (ref 4.14–5.80)
RDW: 14.3 % (ref 12.3–15.4)
WBC: 12.4 10*3/uL — AB (ref 3.4–10.8)

## 2018-07-28 NOTE — Progress Notes (Signed)
Subjective:    Patient ID: [name], male    DOB: October 15, 1931, 84 y.o.   MRN: [id number]  HPI Pt here for follow up and management of chronic medical problems which includes hypertension, hyperlipidemia and a fib. He is taking medication regularly.  The patient is doing well following his left hip replacement but did have a difficult course with bleeding and bruising and had to be moved to the intensive care unit after developing bleeding low hemoglobin and rapid heart rate.  He is still on Coumadin but our goal should be to keep the INR as low as possible to be therapeutic.  His surgery was in July.  His INR today was 2.3 and we will ask him to continue with the Coumadin as he is currently doing and get another pro time in about a week and get these weekly for 2 or 3 weeks before we change him out to doing it less often.  The patient denies any chest pain pressure tightness or shortness of breath.  He denies any trouble with his stomach including nausea vomiting diarrhea blood in the stool and black tarry bowel movements.  He denies any blood in the urine.  His INR was 2.3.  He is concerned about the left hip as to him and his wife it feels a little bit fuller but he does not recall doing anything to have caused any further bleeding.  We did talk about walking on level surfaces not climbing and avoiding driving for the next couple of weeks until the hip settles down and the pro time is staying normal for the next 2 to 3 weeks.    Patient Active Problem List   Diagnosis Date Noted  . Cough   . Leukocytosis   . Acute on chronic combined systolic and diastolic CHF (congestive heart failure) ([place])   . Atrial fibrillation with RVR ([place]) 05/07/2018  . Hypotension 05/07/2018  . H/O total hip arthroplasty, left 05/04/2018  . Preop cardiovascular exam 12/16/2017  . Dyslipidemia 12/16/2017  . Stenosis of left carotid artery 12/16/2017  . Peripheral vascular insufficiency ([place]) 11/05/2017  . Primary  osteoarthritis involving multiple joints 11/05/2017  . Vitamin D deficiency 11/05/2017  . Primary osteoarthritis of left knee 11/05/2017  . Chronic atrial fibrillation 11/05/2017  . Left-sided carotid artery disease ([place]) 06/02/2017  . Coronary artery disease involving native coronary artery of native heart without angina pectoris 06/02/2017  . Chest pain with moderate risk of acute coronary syndrome 03/29/2017  . Right hip pain 02/12/2016  . Metabolic syndrome 40/98/1191  . Acute blood loss as cause of postoperative anemia 01/22/2015  . Urinary retention 01/21/2015  . Cataract of both eyes 05/25/2014  . Long term current use of anticoagulant therapy 02/14/2013  . OVERWEIGHT 12/11/2009  . ANEMIA 02/25/2009  . Essential hypertension 02/25/2009  . Coronary atherosclerosis 02/25/2009  . Atrial fibrillation, persistent ([place]) 02/25/2009  . GERD 02/25/2009  . Osteoarthritis 02/25/2009   Outpatient Encounter Medications as of 07/28/2018  Medication Sig  . acetaminophen (TYLENOL) 650 MG CR tablet Take 650 mg by mouth every 8 (eight) hours as needed for pain.   Marland Kitchen amiodarone (PACERONE) 200 MG tablet Take 1 tablet (200 mg total) by mouth daily.  . Cholecalciferol (VITAMIN D3) 5000 units CAPS Take 5,000 Units by mouth every 30 (thirty) days.  Marland Kitchen diltiazem (CARDIZEM CD) 120 MG 24 hr capsule TAKE ONE (1) CAPSULE EACH DAY  . docusate sodium (COLACE) 100 MG capsule Take 1 capsule (100 mg  total) by mouth 2 (two) times daily.  Marland Kitchen ezetimibe (ZETIA) 10 MG tablet TAKE ONE (1) TABLET EACH DAY  . finasteride (PROSCAR) 5 MG tablet TAKE ONE (1) TABLET EACH DAY  . fluticasone (FLONASE) 50 MCG/ACT nasal spray Place 1 spray into both nostrils daily as needed for allergies or rhinitis.  . furosemide (LASIX) 40 MG tablet Take 1 tablet (40 mg total) by mouth daily. (Patient taking differently: 40 mg. Take 4m by mouth every other day and 218mby mouth every other day)  . lovastatin (MEVACOR) 20 MG tablet Take 1  tablet (20 mg total) by mouth at bedtime. (Patient taking differently: Take 20 mg by mouth 3 (three) times a week. )  . metoprolol (TOPROL-XL) 200 MG 24 hr tablet Take 1 tablet (200 mg total) by mouth daily.  . Vladimir Fasterlycol-Propyl Glycol (SYSTANE OP) Place 1 drop into both eyes daily.  . Marland Kitchenarfarin (COUMADIN) 5 MG tablet warfarin 5 mg tablet   No facility-administered encounter medications on file as of 07/28/2018.      Review of Systems  Constitutional: Negative.   HENT: Negative.   Eyes: Negative.   Respiratory: Negative.   Cardiovascular: Negative.   Gastrointestinal: Negative.   Endocrine: Negative.   Genitourinary: Negative.   Musculoskeletal: Positive for arthralgias (left hip bruised).  Skin: Negative.   Allergic/Immunologic: Negative.   Neurological: Negative.   Hematological: Negative.   Psychiatric/Behavioral: Negative.        Objective:   Physical Exam  Constitutional: He is oriented to person, place, and time. He appears well-developed and well-nourished. No distress.  Patient today is pleasant and kind and understands he needs to reduce his activity somewhat other than just with walking.  HENT:  Head: Normocephalic and atraumatic.  Right Ear: External ear normal.  Left Ear: External ear normal.  Nose: Nose normal.  Mouth/Throat: Oropharynx is clear and moist. No oropharyngeal exudate.  Eyes: Pupils are equal, round, and reactive to light. Conjunctivae and EOM are normal. Right eye exhibits no discharge. Left eye exhibits no discharge. No scleral icterus.  Neck: Normal range of motion. Neck supple. No thyromegaly present.  No bruits thyromegaly or anterior cervical adenopathy  Cardiovascular: Normal rate, regular rhythm, normal heart sounds and intact distal pulses.  No murmur heard. The heart is slightly irregular irregular at 72/min  Pulmonary/Chest: Effort normal and breath sounds normal. He has no wheezes. He has no rales. He exhibits no tenderness.  Clear  anteriorly and posteriorly no chest wall masses or axillary adenopathy  Abdominal: Soft. Bowel sounds are normal. He exhibits no mass. There is no tenderness. There is no guarding.  Abdomen is non-tender without liver or spleen enlargement epigastric tenderness bruits masses or inguinal adenopathy  Musculoskeletal: Normal range of motion. He exhibits no edema or tenderness.  Minimal swelling and redness over the left lateral hip area and no tenderness to palpation.  Lymphadenopathy:    He has no cervical adenopathy.  Neurological: He is alert and oriented to person, place, and time. He has normal reflexes. No cranial nerve deficit.  Skin: Skin is warm and dry. No rash noted.  Psychiatric: He has a normal mood and affect. His behavior is normal. Judgment and thought content normal.  Mood affect and behavior for this patient are normal.  Nursing note and vitals reviewed.  BP (!) 151/87 (BP Location: Left Arm)   Pulse 74   Temp 97.7 F (36.5 C) (Oral)   Ht 5' 7.5" (1.715 m)   Wt 186 lb (84.4  kg)   BMI 28.70 kg/m   Blood pressure recheck--      Assessment & Plan:  1. Atrial fibrillation, persistent -Continue with Coumadin at current dose - CBC with Differential/Platelet - CoaguChek XS/INR Waived  2. Hypertension, essential -Blood pressure is slightly elevated today. - CBC with Differential/Platelet - BMP8+EGFR - Hepatic function panel  3. Other hyperlipidemia -Continue treatment and as aggressive therapeutic lifestyle changes as possible - CBC with Differential/Platelet - Lipid panel  4. Thoracic aortic atherosclerosis ([place]) -Continue with aggressive therapeutic lifestyle changes especially with diet and gradually increase walking and drink more water - CBC with Differential/Platelet - Lipid panel  5. Gastroesophageal reflux disease, esophagitis presence not specified -No complaints with reflux today - CBC with Differential/Platelet - Hepatic function panel  6.  Vitamin D deficiency -Continue with vitamin D replacement pending results of lab work - CBC with Differential/Platelet - VITAMIN D 25 Hydroxy (Vit-D Deficiency, Fractures)  7. Status post left hip replacement -Follow-up with Dr. Gladstone Lighter as planned or sooner if hip pain gets worse -Try to limit driving and climbing and only do walking on a flat surface -Use some ice packs over the area for the next 7 to 10 days off and on during the day to reduce swelling  Patient Instructions                       Medicare Annual Wellness Visit  [facility] and the medical providers at [facility] strive to bring you the best medical care.  In doing so we not only want to address your current medical conditions and concerns but also to detect new conditions early and prevent illness, disease and health-related problems.    Medicare offers a yearly Wellness Visit which allows our clinical staff to assess your need for preventative services including immunizations, lifestyle education, counseling to decrease risk of preventable diseases and screening for fall risk and other medical concerns.    This visit is provided free of charge (no copay) for all Medicare recipients. The clinical pharmacists at [facility] have begun to conduct these Wellness Visits which will also include a thorough review of all your medications.    As you primary medical provider recommend that you make an appointment for your Annual Wellness Visit if you have not done so already this year.  You may set up this appointment before you leave today or you may call back ([phone]) and schedule an appointment.  Please make sure when you call that you mention that you are scheduling your Annual Wellness Visit with the clinical pharmacist so that the appointment may be made for the proper length of time.     Continue current medications. Continue good therapeutic lifestyle changes which include good  diet and exercise. Fall precautions discussed with patient. If an FOBT was given today- please return it to our front desk. If you are over 36 years old - you may need Prevnar 67 or the adult Pneumonia vaccine.  **Flu shots are available--- please call and schedule a FLU-CLINIC appointment**  After your visit with [place] today you will receive a survey in the mail or online from [employer] regarding your care with [place]. Please take a moment to fill this out. Your feedback is very important to [place] as you can help [place] better understand your patient needs as well as improve your experience and satisfaction. WE CARE ABOUT YOU!!!   Repeat pro time in 1 week Use  ice over the left hip for 20 minutes 3 or 4 times daily Walk on a level surface Do not do any climbing Do not do any driving other than riding with your wife If hip pain gets worse get back in touch with [place] and we will call the orthopedic surgeon   Arrie Senate MD

## 2018-07-28 NOTE — Patient Instructions (Addendum)
Medicare Annual Wellness Visit  [facility] and the medical providers at [facility] strive to bring you the best medical care.  In doing so we not only want to address your current medical conditions and concerns but also to detect new conditions early and prevent illness, disease and health-related problems.    Medicare offers a yearly Wellness Visit which allows our clinical staff to assess your need for preventative services including immunizations, lifestyle education, counseling to decrease risk of preventable diseases and screening for fall risk and other medical concerns.    This visit is provided free of charge (no copay) for all Medicare recipients. The clinical pharmacists at [facility] have begun to conduct these Wellness Visits which will also include a thorough review of all your medications.    As you primary medical provider recommend that you make an appointment for your Annual Wellness Visit if you have not done so already this year.  You may set up this appointment before you leave today or you may call back ([phone]) and schedule an appointment.  Please make sure when you call that you mention that you are scheduling your Annual Wellness Visit with the clinical pharmacist so that the appointment may be made for the proper length of time.     Continue current medications. Continue good therapeutic lifestyle changes which include good diet and exercise. Fall precautions discussed with patient. If an FOBT was given today- please return it to our front desk. If you are over 58 years old - you may need Prevnar 78 or the adult Pneumonia vaccine.  **Flu shots are available--- please call and schedule a FLU-CLINIC appointment**  After your visit with [place] today you will receive a survey in the mail or online from [employer] regarding your care with [place]. Please take a moment to fill this out. Your feedback is very  important to [place] as you can help [place] better understand your patient needs as well as improve your experience and satisfaction. WE CARE ABOUT YOU!!!   Repeat pro time in 1 week Use ice over the left hip for 20 minutes 3 or 4 times daily Walk on a level surface Do not do any climbing Do not do any driving other than riding with your wife If hip pain gets worse get back in touch with [place] and we will call the orthopedic surgeon   Description   Continue same dose of Coumadin which in 2.5 mg daily - all days   INR today is 2.3 Goal is 2-3

## 2018-07-29 LAB — LIPID PANEL
Chol/HDL Ratio: 3.1 ratio (ref 0.0–5.0)
Cholesterol, Total: 155 mg/dL (ref 100–199)
HDL: 50 mg/dL (ref >39)
LDL Calculated: 84 mg/dL (ref 0–99)
Triglycerides: 105 mg/dL (ref 0–149)
VLDL CHOLESTEROL CAL: 21 mg/dL (ref 5–40)

## 2018-07-29 LAB — BMP8+EGFR
BUN / CREAT RATIO: 11 (ref 10–24)
BUN: 13 mg/dL (ref 8–27)
CALCIUM: 9.2 mg/dL (ref 8.6–10.2)
CHLORIDE: 104 mmol/L (ref 96–106)
CO2: 25 mmol/L (ref 20–29)
Creatinine, Ser: 1.19 mg/dL (ref 0.76–1.27)
GFR calc non Af Amer: 56 mL/min/{1.73_m2} — ABNORMAL LOW (ref >59)
GFR, EST AFRICAN AMERICAN: 65 mL/min/{1.73_m2} (ref >59)
Glucose: 112 mg/dL — ABNORMAL HIGH (ref 65–99)
POTASSIUM: 4.1 mmol/L (ref 3.5–5.2)
Sodium: 145 mmol/L — ABNORMAL HIGH (ref 134–144)

## 2018-07-29 LAB — HEPATIC FUNCTION PANEL
ALK PHOS: 66 IU/L (ref 39–117)
ALT: 32 IU/L (ref 0–44)
AST: 32 IU/L (ref 0–40)
Albumin: 3.9 g/dL (ref 3.5–4.7)
BILIRUBIN TOTAL: 0.6 mg/dL (ref 0.0–1.2)
BILIRUBIN, DIRECT: 0.22 mg/dL (ref 0.00–0.40)
TOTAL PROTEIN: 6.4 g/dL (ref 6.0–8.5)

## 2018-07-29 LAB — VITAMIN D 25 HYDROXY (VIT D DEFICIENCY, FRACTURES): VIT D 25 HYDROXY: 28.9 ng/mL — AB (ref 30.0–100.0)

## 2018-08-03 ENCOUNTER — Ambulatory Visit (INDEPENDENT_AMBULATORY_CARE_PROVIDER_SITE_OTHER): Payer: Medicare Other | Admitting: *Deleted

## 2018-08-03 DIAGNOSIS — I4819 Other persistent atrial fibrillation: Secondary | ICD-10-CM

## 2018-08-03 DIAGNOSIS — Z7901 Long term (current) use of anticoagulants: Secondary | ICD-10-CM

## 2018-08-03 LAB — COAGUCHEK XS/INR WAIVED
INR: 1.7 — ABNORMAL HIGH (ref 0.9–1.1)
PROTHROMBIN TIME: 20.7 s

## 2018-08-03 NOTE — Progress Notes (Signed)
Description   Take a whole 5 mg tab today - then continue same dose of Coumadin which in 2.5 mg daily - all other days  Recheck next week   INR today is 1.7 Goal is 2-3

## 2018-08-03 NOTE — Patient Instructions (Signed)
Description   Take a whole 5 mg tab today - then continue same dose of Coumadin which in 2.5 mg daily - all other days  Recheck next week   INR today is 1.7 Goal is 2-3

## 2018-08-03 NOTE — Addendum Note (Signed)
Addended by: Zannie Cove on: 08/03/2018 03:18 PM   Modules accepted: Orders

## 2018-08-06 ENCOUNTER — Other Ambulatory Visit: Payer: Self-pay | Admitting: Family Medicine

## 2018-08-10 ENCOUNTER — Ambulatory Visit (INDEPENDENT_AMBULATORY_CARE_PROVIDER_SITE_OTHER): Payer: Medicare Other | Admitting: Pharmacist Clinician (PhC)/ Clinical Pharmacy Specialist

## 2018-08-10 DIAGNOSIS — Z7901 Long term (current) use of anticoagulants: Secondary | ICD-10-CM | POA: Diagnosis not present

## 2018-08-10 DIAGNOSIS — I4819 Other persistent atrial fibrillation: Secondary | ICD-10-CM | POA: Diagnosis not present

## 2018-08-10 LAB — COAGUCHEK XS/INR WAIVED
INR: 2 — ABNORMAL HIGH (ref 0.9–1.1)
Prothrombin Time: 24 s

## 2018-08-10 NOTE — Patient Instructions (Signed)
Description   Continue taking 2.5mg  every day  INR today is 2.0 (goal is 2-3)

## 2018-08-22 NOTE — Progress Notes (Signed)
HPI The patient presents for followup of his coronary disease and atrial fibrillation.   Lexiscan Myoview in June 2018 demonstrated infarct but no ischemia.  EF on echo was 40 - 45%.  This was done to evaluate chest pain.  He returns for follow up.  Since I last saw him he is done well.  He has been walking for exercise.  At the last visit I reduce his amiodarone.  He did well with that.  He has had symptomatic tachypalpitations.  I was going to stop the amiodarone this visit and so his wife did not have it filled but ran out 3 days ago.   Allergies  Allergen Reactions  . Penicillins Hives and Rash    Has patient had a PCN reaction causing immediate rash, facial/tongue/throat swelling, SOB or lightheadedness with hypotension: Yes Has patient had a PCN reaction causing severe rash involving mucus membranes or skin necrosis: Yes Has patient had a PCN reaction that required hospitalization: No Has patient had a PCN reaction occurring within the last 10 years: No If all of the above answers are "NO", then may proceed with Cephalosporin use.   . Hct [Hydrochlorothiazide] Other (See Comments)    hyper  . Statins Other (See Comments)    Myalgia, tolerates low dosages of lovastatin     Current Outpatient Medications  Medication Sig Dispense Refill  . acetaminophen (TYLENOL) 650 MG CR tablet Take 650 mg by mouth every 8 (eight) hours as needed for pain.     . Cholecalciferol (VITAMIN D3) 5000 units CAPS Take 5,000 Units by mouth every 30 (thirty) days.    Marland Kitchen diltiazem (CARDIZEM CD) 120 MG 24 hr capsule TAKE ONE (1) CAPSULE EACH DAY 90 capsule 1  . docusate sodium (COLACE) 100 MG capsule Take 1 capsule (100 mg total) by mouth 2 (two) times daily. 10 capsule 0  . ezetimibe (ZETIA) 10 MG tablet TAKE ONE (1) TABLET EACH DAY 90 tablet 1  . finasteride (PROSCAR) 5 MG tablet TAKE ONE (1) TABLET EACH DAY 90 tablet 1  . fluticasone (FLONASE) 50 MCG/ACT nasal spray Place 1 spray into both nostrils  daily as needed for allergies or rhinitis.    . furosemide (LASIX) 20 MG tablet Take 20 mg by mouth daily.    Marland Kitchen lovastatin (MEVACOR) 20 MG tablet Take 1 tablet (20 mg total) by mouth at bedtime. (Patient taking differently: Take 20 mg by mouth at bedtime. Take 1/2 tablet by mouth three times a week) 90 tablet 1  . metoprolol (TOPROL-XL) 200 MG 24 hr tablet Take 1 tablet (200 mg total) by mouth daily. 60 tablet 4  . Polyethyl Glycol-Propyl Glycol (SYSTANE OP) Place 1 drop into both eyes daily.    Marland Kitchen warfarin (COUMADIN) 5 MG tablet warfarin 5 mg tablet     No current facility-administered medications for this visit.     Past Medical History:  Diagnosis Date  . Anemia   . Atrial fibrillation ([place])   . Cataract   . Cellulitis    In past  . Complication of anesthesia    HARD TIME WAKING; CAUSES MY BP TO GO UP   . Coronary artery disease    5 bypasses  . DJD (degenerative joint disease)   . Ejection fraction < 50%    Mildly reduced, 40% by echo  . GERD (gastroesophageal reflux disease)   . Hyperlipidemia   . Hypertension   . Persistent atrial fibrillation   . Prostate hypertrophy  on CT scan 09/2014  . PUD (peptic ulcer disease)    RESOLVED   . Skin cancer of eyelid    Resected    Past Surgical History:  Procedure Laterality Date  . APPENDECTOMY    . BYPASS GRAFT  2005  . CATARACT EXTRACTION W/PHACO Left 07/22/2015   Procedure: CATARACT EXTRACTION PHACO AND INTRAOCULAR LENS PLACEMENT LEFT EYE CDE=8.14;  Surgeon: Tonny Branch, MD;  Location: [facility];  Service: Ophthalmology;  Laterality: Left;  . CORONARY ARTERY BYPASS GRAFT  4/05   LIMA to LAD, SVG to PDA, SVG to ramus intermediate  . EYE SURGERY  07/2015  . EYELID CARCINOMA EXCISION     Skin cancer resected  . Heart bypass  2005  . TOTAL HIP ARTHROPLASTY     Right  . TOTAL HIP ARTHROPLASTY Left 05/04/2018   Procedure: LEFT TOTAL HIP ARTHROPLASTY;  Surgeon: [name], MD;  Location: [facility];  Service: Orthopedics;   Laterality: Left;  . TOTAL KNEE ARTHROPLASTY     Right     ROS:  As stated in the HPI and negative for all other systems.   PHYSICAL EXAM BP (!) 150/110   Pulse 73   Ht 5\' 7"  (1.702 m)   Wt 187 lb (84.8 kg)   BMI 29.29 kg/m   GENERAL:  Well appearing NECK:  No jugular venous distention, waveform within normal limits, carotid upstroke brisk and symmetric, no bruits, no thyromegaly LUNGS:  Clear to auscultation bilaterally CHEST:  Unremarkable HEART:  PMI not displaced or sustained,S1 and S2 within normal limits, no S3, no clicks, no rubs, no murmurs, irregular ABD:  Flat, positive bowel sounds normal in frequency in pitch, no bruits, no rebound, no guarding, no midline pulsatile mass, no hepatomegaly, no splenomegaly EXT:  2 plus pulses throughout, no edema, no cyanosis no clubbing   EKG:  Atrial fib, rate 73, axis within normal limits, intervals within the limits, no acute ST-T wave changes.  Lab Results  Component Value Date   CHOL 155 07/28/2018   TRIG 105 07/28/2018   HDL 50 07/28/2018   LDLCALC 84 07/28/2018    ASSESSMENT AND PLAN  [name]:  The patient has no new sypmtoms.  No further cardiovascular testing is indicated.  We will continue with aggressive risk reduction and meds as listed.  CM:  He has a mildly reduced EF.  No change in therapy.   HTN:  The blood pressure is controlled.   HYPERLIPIDEMIA:  LDL is 84.  No change in therapy.   ATRIAL FIBRILLATION:   Mr. HARJAS BIGGINS has a CHA2DS2 - VASc score of 4.  I will stop the amiodarone.   CAROTID STENOSIS:  He had a Doppler this year with less than 50% left ICA stenosis.   No change in therapy.   CHRONIC SYSTOLIC AND DIASTOLIC HF:  He seems to be seem to be euvolemic.

## 2018-08-24 ENCOUNTER — Ambulatory Visit (INDEPENDENT_AMBULATORY_CARE_PROVIDER_SITE_OTHER): Payer: Medicare Other | Admitting: Pharmacist Clinician (PhC)/ Clinical Pharmacy Specialist

## 2018-08-24 ENCOUNTER — Encounter: Payer: Self-pay | Admitting: Cardiology

## 2018-08-24 ENCOUNTER — Ambulatory Visit (INDEPENDENT_AMBULATORY_CARE_PROVIDER_SITE_OTHER): Payer: Medicare Other | Admitting: Cardiology

## 2018-08-24 VITALS — BP 150/110 | HR 73 | Ht 67.0 in | Wt 187.0 lb

## 2018-08-24 DIAGNOSIS — I4819 Other persistent atrial fibrillation: Secondary | ICD-10-CM | POA: Diagnosis not present

## 2018-08-24 DIAGNOSIS — I482 Chronic atrial fibrillation, unspecified: Secondary | ICD-10-CM

## 2018-08-24 DIAGNOSIS — I2510 Atherosclerotic heart disease of native coronary artery without angina pectoris: Secondary | ICD-10-CM | POA: Diagnosis not present

## 2018-08-24 DIAGNOSIS — Z7901 Long term (current) use of anticoagulants: Secondary | ICD-10-CM | POA: Diagnosis not present

## 2018-08-24 DIAGNOSIS — I10 Essential (primary) hypertension: Secondary | ICD-10-CM | POA: Diagnosis not present

## 2018-08-24 LAB — COAGUCHEK XS/INR WAIVED
INR: 2.3 — AB (ref 0.9–1.1)
PROTHROMBIN TIME: 27.4 s

## 2018-08-24 NOTE — Patient Instructions (Signed)
Description   Continue taking 2.5mg  every day  INR today is 2.3 (goal is 2-3) Perfect reading today!

## 2018-08-24 NOTE — Patient Instructions (Signed)
Medication Instructions:  Please discontinue your Amiodarone. Continue all other medications as listed.  Follow-Up: Follow up in 1 year with Dr. Percival Spanish.  You will receive a letter in the mail 2 months before you are due.  Please call us when you receive this letter to schedule your follow up appointment.  Thank you for choosing [facility]!!

## 2018-08-25 ENCOUNTER — Other Ambulatory Visit: Payer: Self-pay | Admitting: Family Medicine

## 2018-09-28 ENCOUNTER — Ambulatory Visit (INDEPENDENT_AMBULATORY_CARE_PROVIDER_SITE_OTHER): Payer: Medicare Other | Admitting: Pharmacist Clinician (PhC)/ Clinical Pharmacy Specialist

## 2018-09-28 DIAGNOSIS — Z7901 Long term (current) use of anticoagulants: Secondary | ICD-10-CM | POA: Diagnosis not present

## 2018-09-28 DIAGNOSIS — I4819 Other persistent atrial fibrillation: Secondary | ICD-10-CM

## 2018-09-28 LAB — COAGUCHEK XS/INR WAIVED
INR: 1.5 — AB (ref 0.9–1.1)
PROTHROMBIN TIME: 17.9 s

## 2018-09-28 NOTE — Patient Instructions (Signed)
Description   Take 1 tablet today then take 1 tablet a day except for Mondays, Wednesdays, and Fridays 1/2 tablet  INR today is 1.5 (goal 2-3) too thick today

## 2018-09-30 ENCOUNTER — Other Ambulatory Visit: Payer: Self-pay | Admitting: *Deleted

## 2018-09-30 DIAGNOSIS — R6889 Other general symptoms and signs: Secondary | ICD-10-CM

## 2018-09-30 DIAGNOSIS — R296 Repeated falls: Secondary | ICD-10-CM

## 2018-09-30 DIAGNOSIS — R42 Dizziness and giddiness: Secondary | ICD-10-CM

## 2018-09-30 NOTE — Progress Notes (Signed)
amb ref °

## 2018-10-10 ENCOUNTER — Ambulatory Visit: Payer: Medicare Other | Admitting: Pharmacist Clinician (PhC)/ Clinical Pharmacy Specialist

## 2018-10-10 DIAGNOSIS — R739 Hyperglycemia, unspecified: Secondary | ICD-10-CM | POA: Diagnosis not present

## 2018-10-10 DIAGNOSIS — I4819 Other persistent atrial fibrillation: Secondary | ICD-10-CM | POA: Diagnosis not present

## 2018-10-10 DIAGNOSIS — Z7901 Long term (current) use of anticoagulants: Secondary | ICD-10-CM | POA: Diagnosis not present

## 2018-10-10 LAB — COAGUCHEK XS/INR WAIVED
INR: 3.2 — ABNORMAL HIGH (ref 0.9–1.1)
Prothrombin Time: 38.4 s

## 2018-10-10 LAB — GLUCOSE HEMOCUE WAIVED: Glu Hemocue Waived: 114 mg/dL — ABNORMAL HIGH (ref 65–99)

## 2018-11-14 ENCOUNTER — Other Ambulatory Visit: Payer: Self-pay | Admitting: Family Medicine

## 2018-11-16 ENCOUNTER — Ambulatory Visit (INDEPENDENT_AMBULATORY_CARE_PROVIDER_SITE_OTHER): Payer: Medicare Other | Admitting: Pharmacist Clinician (PhC)/ Clinical Pharmacy Specialist

## 2018-11-16 ENCOUNTER — Encounter: Payer: Self-pay | Admitting: Pharmacist Clinician (PhC)/ Clinical Pharmacy Specialist

## 2018-11-16 DIAGNOSIS — I4819 Other persistent atrial fibrillation: Secondary | ICD-10-CM | POA: Diagnosis not present

## 2018-11-16 DIAGNOSIS — Z7901 Long term (current) use of anticoagulants: Secondary | ICD-10-CM

## 2018-11-16 DIAGNOSIS — R739 Hyperglycemia, unspecified: Secondary | ICD-10-CM | POA: Diagnosis not present

## 2018-11-16 LAB — GLUCOSE HEMOCUE WAIVED: Glu Hemocue Waived: 126 mg/dL — ABNORMAL HIGH (ref 65–99)

## 2018-11-16 LAB — COAGUCHEK XS/INR WAIVED
INR: 2.7 — ABNORMAL HIGH (ref 0.9–1.1)
Prothrombin Time: 32.2 s

## 2018-11-16 NOTE — Patient Instructions (Addendum)
Description   Continue to take 1 tablet a day except for Mondays, Wednesdays, and Fridays 1/2 tablet  INR today is 2.7  (goal is 2-3)  Perfect reading!

## 2018-12-06 ENCOUNTER — Ambulatory Visit (INDEPENDENT_AMBULATORY_CARE_PROVIDER_SITE_OTHER): Payer: Medicare Other | Admitting: Family Medicine

## 2018-12-06 ENCOUNTER — Encounter: Payer: Self-pay | Admitting: Family Medicine

## 2018-12-06 VITALS — BP 154/84 | HR 68 | Temp 97.1°F | Ht 67.0 in | Wt 184.0 lb

## 2018-12-06 DIAGNOSIS — I700 Atherosclerosis of aorta: Secondary | ICD-10-CM | POA: Diagnosis not present

## 2018-12-06 DIAGNOSIS — Z7901 Long term (current) use of anticoagulants: Secondary | ICD-10-CM

## 2018-12-06 DIAGNOSIS — K219 Gastro-esophageal reflux disease without esophagitis: Secondary | ICD-10-CM

## 2018-12-06 DIAGNOSIS — R972 Elevated prostate specific antigen [PSA]: Secondary | ICD-10-CM

## 2018-12-06 DIAGNOSIS — I4819 Other persistent atrial fibrillation: Secondary | ICD-10-CM | POA: Diagnosis not present

## 2018-12-06 DIAGNOSIS — E559 Vitamin D deficiency, unspecified: Secondary | ICD-10-CM | POA: Diagnosis not present

## 2018-12-06 DIAGNOSIS — E7849 Other hyperlipidemia: Secondary | ICD-10-CM | POA: Diagnosis not present

## 2018-12-06 DIAGNOSIS — I739 Peripheral vascular disease, unspecified: Secondary | ICD-10-CM

## 2018-12-06 DIAGNOSIS — I10 Essential (primary) hypertension: Secondary | ICD-10-CM | POA: Diagnosis not present

## 2018-12-06 LAB — COAGUCHEK XS/INR WAIVED
INR: 3.3 — AB (ref 0.9–1.1)
Prothrombin Time: 39.4 s

## 2018-12-06 NOTE — Addendum Note (Signed)
Addended by: Zannie Cove on: 12/06/2018 11:37 AM   Modules accepted: Orders

## 2018-12-06 NOTE — Progress Notes (Signed)
Subjective:    Patient ID: [name], male    DOB: Nov 10, 1931, 85 y.o.   MRN: [id number]  HPI Pt here for follow up and management of chronic medical problems which includes hypertension and a fib. He is taking medication regularly.  Patient comes in today for his regular checkup.  He wants to get a PSA test done here.  He is also going to be given an FOBT and get routine lab work.  He is also due to get his pro time because of his history of atrial fibrillation.  On his initial vital signs of blood pressure was elevated on 2 occasions.  This patient has a history of anemia arthritis eyelid skin cancer cataracts GERD hyperlipidemia and hypertension.  The patient is pleasant and says he feels the best that he is felt in a long time since his left hip surgery.  He denies any chest pain or shortness of breath.  He does complain of some constipation since his hip surgery and a change in the caliber of his stools.  He denies any blood in the stool or nausea vomiting or heartburn.  He is taking some MiraLAX periodically and we did discuss with him about increasing this to taking it daily and continuing to drink plenty of water and fluids.  He is passing his water well and is requesting that we do a PSA today.  He does have an appointment with his urologist or plans to make one soon and he will have to take a copy of the PSA result with him to the urologist.  He says that his blood pressures at home run in the 120s to 140s over the upper 70s to about 85 consistently.  They were definitely higher here in the office.    Patient Active Problem List   Diagnosis Date Noted  . Cough   . Leukocytosis   . Acute on chronic combined systolic and diastolic CHF (congestive heart failure) ([place])   . Atrial fibrillation with RVR ([place]) 05/07/2018  . Hypotension 05/07/2018  . H/O total hip arthroplasty, left 05/04/2018  . Preop cardiovascular exam 12/16/2017  . Dyslipidemia 12/16/2017  . Stenosis of left carotid  artery 12/16/2017  . Peripheral vascular insufficiency ([place]) 11/05/2017  . Primary osteoarthritis involving multiple joints 11/05/2017  . Vitamin D deficiency 11/05/2017  . Primary osteoarthritis of left knee 11/05/2017  . Chronic atrial fibrillation 11/05/2017  . Left-sided carotid artery disease ([place]) 06/02/2017  . Coronary artery disease involving native coronary artery of native heart without angina pectoris 06/02/2017  . Chest pain with moderate risk of acute coronary syndrome 03/29/2017  . Right hip pain 02/12/2016  . Metabolic syndrome 66/44/0347  . Acute blood loss as cause of postoperative anemia 01/22/2015  . Urinary retention 01/21/2015  . Cataract of both eyes 05/25/2014  . Long term current use of anticoagulant therapy 02/14/2013  . OVERWEIGHT 12/11/2009  . ANEMIA 02/25/2009  . Essential hypertension 02/25/2009  . Coronary atherosclerosis 02/25/2009  . Atrial fibrillation, persistent ([place]) 02/25/2009  . GERD 02/25/2009  . Osteoarthritis 02/25/2009   Outpatient Encounter Medications as of 12/06/2018  Medication Sig  . acetaminophen (TYLENOL) 650 MG CR tablet Take 650 mg by mouth every 8 (eight) hours as needed for pain.   . Cholecalciferol (VITAMIN D3) 5000 units CAPS Take 5,000 Units by mouth every 30 (thirty) days.  Marland Kitchen diltiazem (CARDIZEM CD) 120 MG 24 hr capsule TAKE ONE (1) CAPSULE EACH DAY  . docusate sodium (COLACE) 100 MG capsule  Take 1 capsule (100 mg total) by mouth 2 (two) times daily.  Marland Kitchen ezetimibe (ZETIA) 10 MG tablet TAKE ONE (1) TABLET EACH DAY  . finasteride (PROSCAR) 5 MG tablet TAKE ONE (1) TABLET EACH DAY  . fluticasone (FLONASE) 50 MCG/ACT nasal spray Place 1 spray into both nostrils daily as needed for allergies or rhinitis.  . furosemide (LASIX) 20 MG tablet TAKE ONE (1) TABLET EACH DAY  . lovastatin (MEVACOR) 20 MG tablet Take 1 tablet (20 mg total) by mouth at bedtime. (Patient taking differently: Take 20 mg by mouth at bedtime. Take 1/2 tablet by  mouth three times a week)  . metoprolol (TOPROL-XL) 200 MG 24 hr tablet Take 1 tablet (200 mg total) by mouth daily.  Vladimir Faster Glycol-Propyl Glycol (SYSTANE OP) Place 1 drop into both eyes daily.  Marland Kitchen warfarin (COUMADIN) 5 MG tablet warfarin 5 mg tablet  . [DISCONTINUED] furosemide (LASIX) 20 MG tablet Take 20 mg by mouth daily.   No facility-administered encounter medications on file as of 12/06/2018.      Review of Systems  Constitutional: Negative.   HENT: Negative.   Eyes: Negative.   Respiratory: Negative.   Cardiovascular: Negative.   Gastrointestinal: Negative.   Endocrine: Negative.   Genitourinary: Negative.   Musculoskeletal: Negative.   Skin: Negative.   Allergic/Immunologic: Negative.   Neurological: Negative.   Hematological: Negative.   Psychiatric/Behavioral: Negative.        Objective:   Physical Exam Vitals signs and nursing note reviewed.  Constitutional:      General: He is not in acute distress.    Appearance: Normal appearance. He is well-developed. He is not ill-appearing.  HENT:     Head: Normocephalic and atraumatic.     Right Ear: Tympanic membrane, ear canal and external ear normal. There is no impacted cerumen.     Left Ear: Tympanic membrane, ear canal and external ear normal. There is no impacted cerumen.     Nose: Nose normal. No congestion.     Mouth/Throat:     Mouth: Mucous membranes are dry.     Pharynx: Oropharynx is clear. No oropharyngeal exudate.  Eyes:     General: No scleral icterus.       Right eye: No discharge.        Left eye: No discharge.     Extraocular Movements: Extraocular movements intact.     Conjunctiva/sclera: Conjunctivae normal.     Pupils: Pupils are equal, round, and reactive to light.     Comments: Cataract and is going to follow-up with my eye doctor to get this taken care of  Neck:     Musculoskeletal: Normal range of motion and neck supple.     Thyroid: No thyromegaly.     Vascular: No carotid bruit.      Trachea: No tracheal deviation.  Cardiovascular:     Rate and Rhythm: Normal rate and regular rhythm.     Heart sounds: Normal heart sounds. No murmur. No gallop.      Comments: The heart is 72/min and regular.  There is no edema in either foot however pulses in either foot were difficult to palpate. Pulmonary:     Effort: Pulmonary effort is normal. No respiratory distress.     Breath sounds: Normal breath sounds. No wheezing or rales.     Comments: Clear anteriorly and posteriorly and no axillary adenopathy chest wall masses or tenderness Chest:     Chest wall: No tenderness.  Abdominal:  General: Abdomen is flat. Bowel sounds are normal.     Palpations: Abdomen is soft. There is no mass.     Tenderness: There is no abdominal tenderness. There is no guarding or rebound.     Comments: The abdomen is soft without liver or spleen enlargement masses bruits or inguinal adenopathy and inguinal pulses were difficult to palpate.  Genitourinary:    Comments: Patient has plans to see the urologist and will take a copy of the PSA result from this blood draw to that visit. Musculoskeletal: Normal range of motion.        General: No tenderness.     Right lower leg: No edema.     Left lower leg: No edema.  Lymphadenopathy:     Cervical: No cervical adenopathy.  Skin:    General: Skin is warm and dry.     Findings: No rash.  Neurological:     General: No focal deficit present.     Mental Status: He is alert and oriented to person, place, and time. Mental status is at baseline.     Cranial Nerves: No cranial nerve deficit.     Gait: Gait normal.     Deep Tendon Reflexes: Reflexes are normal and symmetric. Reflexes normal.  Psychiatric:        Mood and Affect: Mood normal.        Behavior: Behavior normal.        Thought Content: Thought content normal.        Judgment: Judgment normal.     Comments: Mood affect and behavior for this patient are normal for him.    BP (!) 155/86 (BP  Location: Left Arm)   Pulse 68   Temp (!) 97.1 F (36.2 C) (Oral)   Ht _0  (1.702 m)   Wt 184 lb (83.5 kg)   BMI 28.82 kg/m         Assessment & Plan:  1. Atrial fibrillation, persistent -Heart today had a regular rate and rhythm at 72/min - CBC with Differential/Platelet - CoaguChek XS/INR Waived  2. Hypertension, essential -The blood pressure was elevated on 2 occasions today.  He says his blood pressures at home are better.  He will bring readings by for review in a couple weeks. - BMP8+EGFR - CBC with Differential/Platelet - Hepatic function panel  3. Other hyperlipidemia -Continue aggressive therapeutic lifestyle changes and Zetia - CBC with Differential/Platelet - Lipid panel  4. Thoracic aortic atherosclerosis ([place]) -Continue aggressive therapeutic lifestyle changes and Zetia pending results of lab work - CBC with Differential/Platelet - Lipid panel  5. Gastroesophageal reflux disease, esophagitis presence not specified -No complaints with reflux today.  No indications of any bleeding issues. - CBC with Differential/Platelet - Hepatic function panel  6. Vitamin D deficiency -Continue with vitamin D replacement pending results of lab work - CBC with Differential/Platelet - VITAMIN D 25 Hydroxy (Vit-D Deficiency, Fractures)  7. Elevated PSA -Follow-up with urology as planned and take copy of lab work from this office to that visit - CBC with Differential/Platelet - PSA, total and free  8.  Claudication, peripheral vascular insufficiency -Discussed this with Dr. Percival Spanish at next visit as he may want to do some Dopplers of your lower extremities to check on circulation  9. Constipation -Take MiraLAX on a daily basis and add a probiotic to your diet and continue to drink plenty of fluids specially water  Patient Instructions  Medicare Annual Wellness Visit  [facility] [place] [facility] and the medical providers at [facility]  strive to bring you the best medical care.  In doing so we not only want to address your current medical conditions and concerns but also to detect new conditions early and prevent illness, disease and health-related problems.    Medicare offers a yearly Wellness Visit which allows our clinical staff to assess your need for preventative services including immunizations, lifestyle education, counseling to decrease risk of preventable diseases and screening for fall risk and other medical concerns.    This visit is provided free of charge (no copay) for all Medicare recipients. The clinical pharmacists at [facility] have begun to conduct these Wellness Visits which will also include a thorough review of all your medications.    As you primary medical provider recommend that you make an appointment for your Annual Wellness Visit if you have not done so already this year.  You may set up this appointment before you leave today or you may call back ([phone]) and schedule an appointment.  Please make sure when you call that you mention that you are scheduling your Annual Wellness Visit with the clinical pharmacist so that the appointment may be made for the proper length of time.    Continue current medications. Continue good therapeutic lifestyle changes which include good diet and exercise. Fall precautions discussed with patient. If an FOBT was given today- please return it to our front desk. If you are over 100 years old - you may need Prevnar 58 or the adult Pneumonia vaccine.  **Flu shots are available--- please call and schedule a FLU-CLINIC appointment**  After your visit with [place] today you will receive a survey in the mail or online from [employer] regarding your care with [place]. Please take a moment to fill this out. Your feedback is very important to [place] as you can help [place] better understand your patient needs as well as improve your experience and satisfaction. WE CARE ABOUT  YOU!!!   Follow-up with urology as planned Take a copy of the PSA result and lab work with you to that visit Continue to drink lots of water and stay well-hydrated Increase the use of MiraLAX to once daily until your bowels start moving more regularly Also get started on a probiotic like align and take 1 daily Record blood pressure readings at home and bring these by for review in the next 2 to 4 weeks Watch sodium intake Keep follow-up appointment with cardiology Please discussed with the cardiologist at your next visit how your circulation is in your lower extremities and if they are giving out still.  Arrie Senate MD

## 2018-12-06 NOTE — Patient Instructions (Addendum)
Medicare Annual Wellness Visit  [facility] and the medical providers at [facility] strive to bring you the best medical care.  In doing so we not only want to address your current medical conditions and concerns but also to detect new conditions early and prevent illness, disease and health-related problems.    Medicare offers a yearly Wellness Visit which allows our clinical staff to assess your need for preventative services including immunizations, lifestyle education, counseling to decrease risk of preventable diseases and screening for fall risk and other medical concerns.    This visit is provided free of charge (no copay) for all Medicare recipients. The clinical pharmacists at [facility] have begun to conduct these Wellness Visits which will also include a thorough review of all your medications.    As you primary medical provider recommend that you make an appointment for your Annual Wellness Visit if you have not done so already this year.  You may set up this appointment before you leave today or you may call back ([phone]) and schedule an appointment.  Please make sure when you call that you mention that you are scheduling your Annual Wellness Visit with the clinical pharmacist so that the appointment may be made for the proper length of time.    Continue current medications. Continue good therapeutic lifestyle changes which include good diet and exercise. Fall precautions discussed with patient. If an FOBT was given today- please return it to our front desk. If you are over 95 years old - you may need Prevnar 72 or the adult Pneumonia vaccine.  **Flu shots are available--- please call and schedule a FLU-CLINIC appointment**  After your visit with [place] today you will receive a survey in the mail or online from [employer] regarding your care with [place]. Please take a moment to fill this out. Your feedback is very  important to [place] as you can help [place] better understand your patient needs as well as improve your experience and satisfaction. WE CARE ABOUT YOU!!!   Follow-up with urology as planned Take a copy of the PSA result and lab work with you to that visit Continue to drink lots of water and stay well-hydrated Increase the use of MiraLAX to once daily until your bowels start moving more regularly Also get started on a probiotic like align and take 1 daily Record blood pressure readings at home and bring these by for review in the next 2 to 4 weeks Watch sodium intake Keep follow-up appointment with cardiology Please discussed with the cardiologst at your next visit how your circulation is in your lower extremities and if they are giving out still.  Description   Hold today = Continue to take 1 tablet a day except for Mondays, Wednesdays, and Fridays take 1/2 tablet.  INR today is  3.3  (goal is 2-3)

## 2018-12-07 ENCOUNTER — Other Ambulatory Visit: Payer: Self-pay | Admitting: Family Medicine

## 2018-12-07 ENCOUNTER — Other Ambulatory Visit: Payer: Self-pay | Admitting: *Deleted

## 2018-12-07 DIAGNOSIS — E7849 Other hyperlipidemia: Secondary | ICD-10-CM

## 2018-12-07 LAB — VITAMIN D 25 HYDROXY (VIT D DEFICIENCY, FRACTURES): Vit D, 25-Hydroxy: 29.8 ng/mL — ABNORMAL LOW (ref 30.0–100.0)

## 2018-12-07 LAB — LIPID PANEL
Chol/HDL Ratio: 4.3 ratio (ref 0.0–5.0)
Cholesterol, Total: 227 mg/dL — ABNORMAL HIGH (ref 100–199)
HDL: 53 mg/dL (ref >39)
LDL Calculated: 142 mg/dL — ABNORMAL HIGH (ref 0–99)
TRIGLYCERIDES: 158 mg/dL — AB (ref 0–149)
VLDL Cholesterol Cal: 32 mg/dL (ref 5–40)

## 2018-12-07 LAB — BMP8+EGFR
BUN / CREAT RATIO: 13 (ref 10–24)
BUN: 16 mg/dL (ref 8–27)
CHLORIDE: 99 mmol/L (ref 96–106)
CO2: 26 mmol/L (ref 20–29)
CREATININE: 1.27 mg/dL (ref 0.76–1.27)
Calcium: 9.3 mg/dL (ref 8.6–10.2)
GFR calc Af Amer: 60 mL/min/{1.73_m2} (ref >59)
GFR calc non Af Amer: 52 mL/min/{1.73_m2} — ABNORMAL LOW (ref >59)
GLUCOSE: 103 mg/dL — AB (ref 65–99)
Potassium: 4.1 mmol/L (ref 3.5–5.2)
SODIUM: 143 mmol/L (ref 134–144)

## 2018-12-07 LAB — CBC WITH DIFFERENTIAL/PLATELET
Basophils Absolute: 0.1 10*3/uL (ref 0.0–0.2)
Basos: 1 %
EOS (ABSOLUTE): 0.6 10*3/uL — ABNORMAL HIGH (ref 0.0–0.4)
EOS: 5 %
HEMATOCRIT: 43.1 % (ref 37.5–51.0)
Hemoglobin: 15.1 g/dL (ref 13.0–17.7)
IMMATURE GRANULOCYTES: 0 %
Immature Grans (Abs): 0 10*3/uL (ref 0.0–0.1)
LYMPHS ABS: 5.1 10*3/uL — AB (ref 0.7–3.1)
Lymphs: 41 %
MCH: 33.7 pg — ABNORMAL HIGH (ref 26.6–33.0)
MCHC: 35 g/dL (ref 31.5–35.7)
MCV: 96 fL (ref 79–97)
MONOS ABS: 0.7 10*3/uL (ref 0.1–0.9)
Monocytes: 6 %
Neutrophils Absolute: 5.8 10*3/uL (ref 1.4–7.0)
Neutrophils: 47 %
PLATELETS: 221 10*3/uL (ref 150–450)
RBC: 4.48 x10E6/uL (ref 4.14–5.80)
RDW: 12.5 % (ref 11.6–15.4)
WBC: 12.4 10*3/uL — AB (ref 3.4–10.8)

## 2018-12-07 LAB — HEPATIC FUNCTION PANEL
ALBUMIN: 4.4 g/dL (ref 3.6–4.6)
ALK PHOS: 68 IU/L (ref 39–117)
ALT: 19 IU/L (ref 0–44)
AST: 20 IU/L (ref 0–40)
BILIRUBIN TOTAL: 0.5 mg/dL (ref 0.0–1.2)
BILIRUBIN, DIRECT: 0.13 mg/dL (ref 0.00–0.40)
Total Protein: 6.7 g/dL (ref 6.0–8.5)

## 2018-12-07 LAB — PSA, TOTAL AND FREE
PSA FREE PCT: 13.1 %
PSA, Free: 0.94 ng/mL
Prostate Specific Ag, Serum: 7.2 ng/mL — ABNORMAL HIGH (ref 0.0–4.0)

## 2018-12-07 MED ORDER — LOVASTATIN 40 MG PO TABS: 40.0000 mg | ORAL_TABLET | Freq: Every day | ORAL | 3 refills | Status: DC

## 2018-12-12 ENCOUNTER — Ambulatory Visit: Payer: Medicare Other | Admitting: Neurology

## 2018-12-14 ENCOUNTER — Ambulatory Visit: Payer: Medicare Other | Admitting: Pharmacist Clinician (PhC)/ Clinical Pharmacy Specialist

## 2018-12-14 DIAGNOSIS — Z7901 Long term (current) use of anticoagulants: Secondary | ICD-10-CM | POA: Diagnosis not present

## 2018-12-14 DIAGNOSIS — I4819 Other persistent atrial fibrillation: Secondary | ICD-10-CM

## 2018-12-14 LAB — COAGUCHEK XS/INR WAIVED
INR: 3 — AB (ref 0.9–1.1)
Prothrombin Time: 36.4 s

## 2018-12-14 NOTE — Patient Instructions (Addendum)
  Description   Continue to take 1 tablet a day except for Mondays, Wednesdays, and Fridays take 1/2 tablet.  INR today is  3.0 (goal is 2-3)    Eat a high vitamin K food twice a week

## 2019-01-02 ENCOUNTER — Telehealth: Payer: Self-pay | Admitting: *Deleted

## 2019-01-02 NOTE — Telephone Encounter (Signed)
Aware of [facility] notes  Wife has NO symptoms He did get a flu shot  Will update [place] if anything is worse.

## 2019-01-02 NOTE — Telephone Encounter (Signed)
Pt wife calls: She states that on FRI, pt felt bad, but had no specific s/s. On SAT and SUN ran temps around 102.3 This AM was 101.3. Taking tylenol Q6 hours  He has NO: Aches, st, cough, SOB, urine symptoms, ear pain.  He DOES feel weak Any suggestions?

## 2019-01-02 NOTE — Telephone Encounter (Signed)
Continue to take Tylenol and lots of clear liquids and if he develops any symptoms, urinary frequency burning cough sore throat or ear pain he should get back in touch with [place].  Did he have the flu shot.?  Is his wife sick?  May want to consider calling in Tamiflu 75 twice daily for 5 days.  Ask questions first.

## 2019-01-04 ENCOUNTER — Other Ambulatory Visit: Payer: Self-pay | Admitting: Family Medicine

## 2019-01-04 ENCOUNTER — Encounter: Payer: Self-pay | Admitting: Pharmacist Clinician (PhC)/ Clinical Pharmacy Specialist

## 2019-01-22 IMAGING — DX DG CHEST 2V
3 series · 3 of 3 positions shown · non-contrast
Comparison: 09/03/2015

CLINICAL DATA: Raised area on chest

EXAM:
CHEST - 2 VIEW

[chest pa]
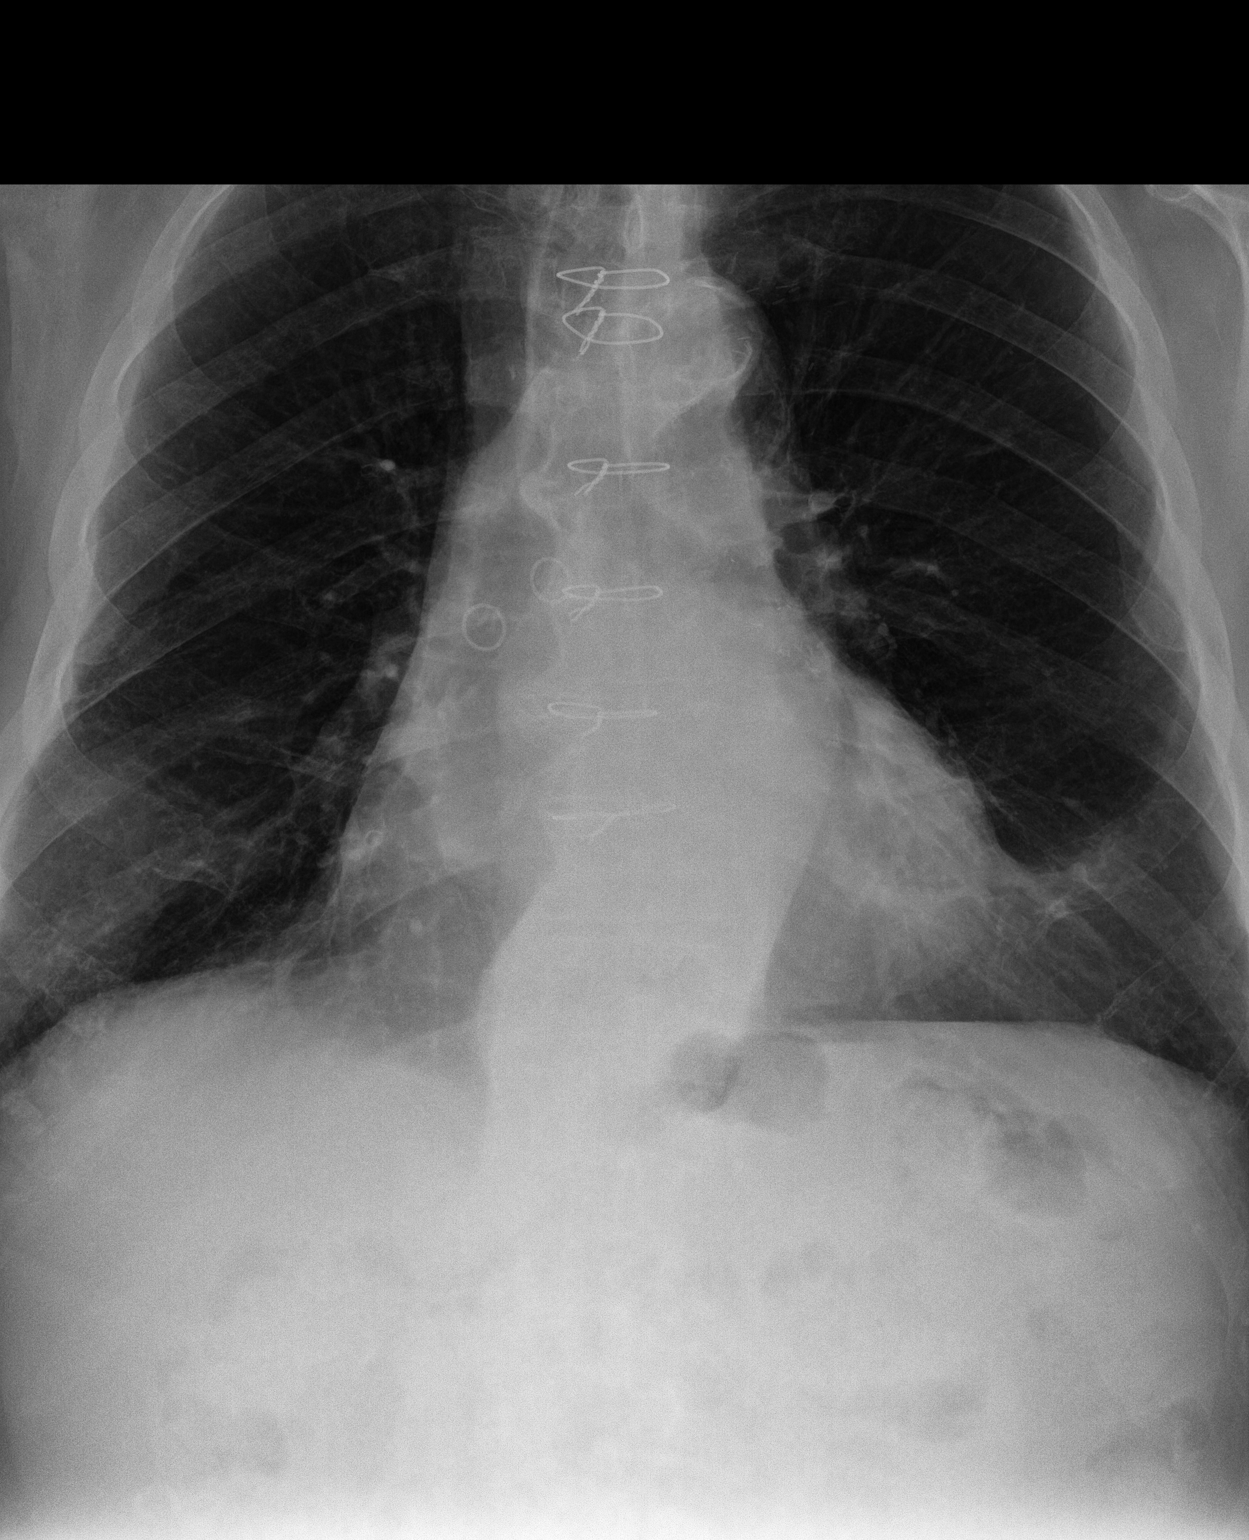

[chest lat (1 of 2)]
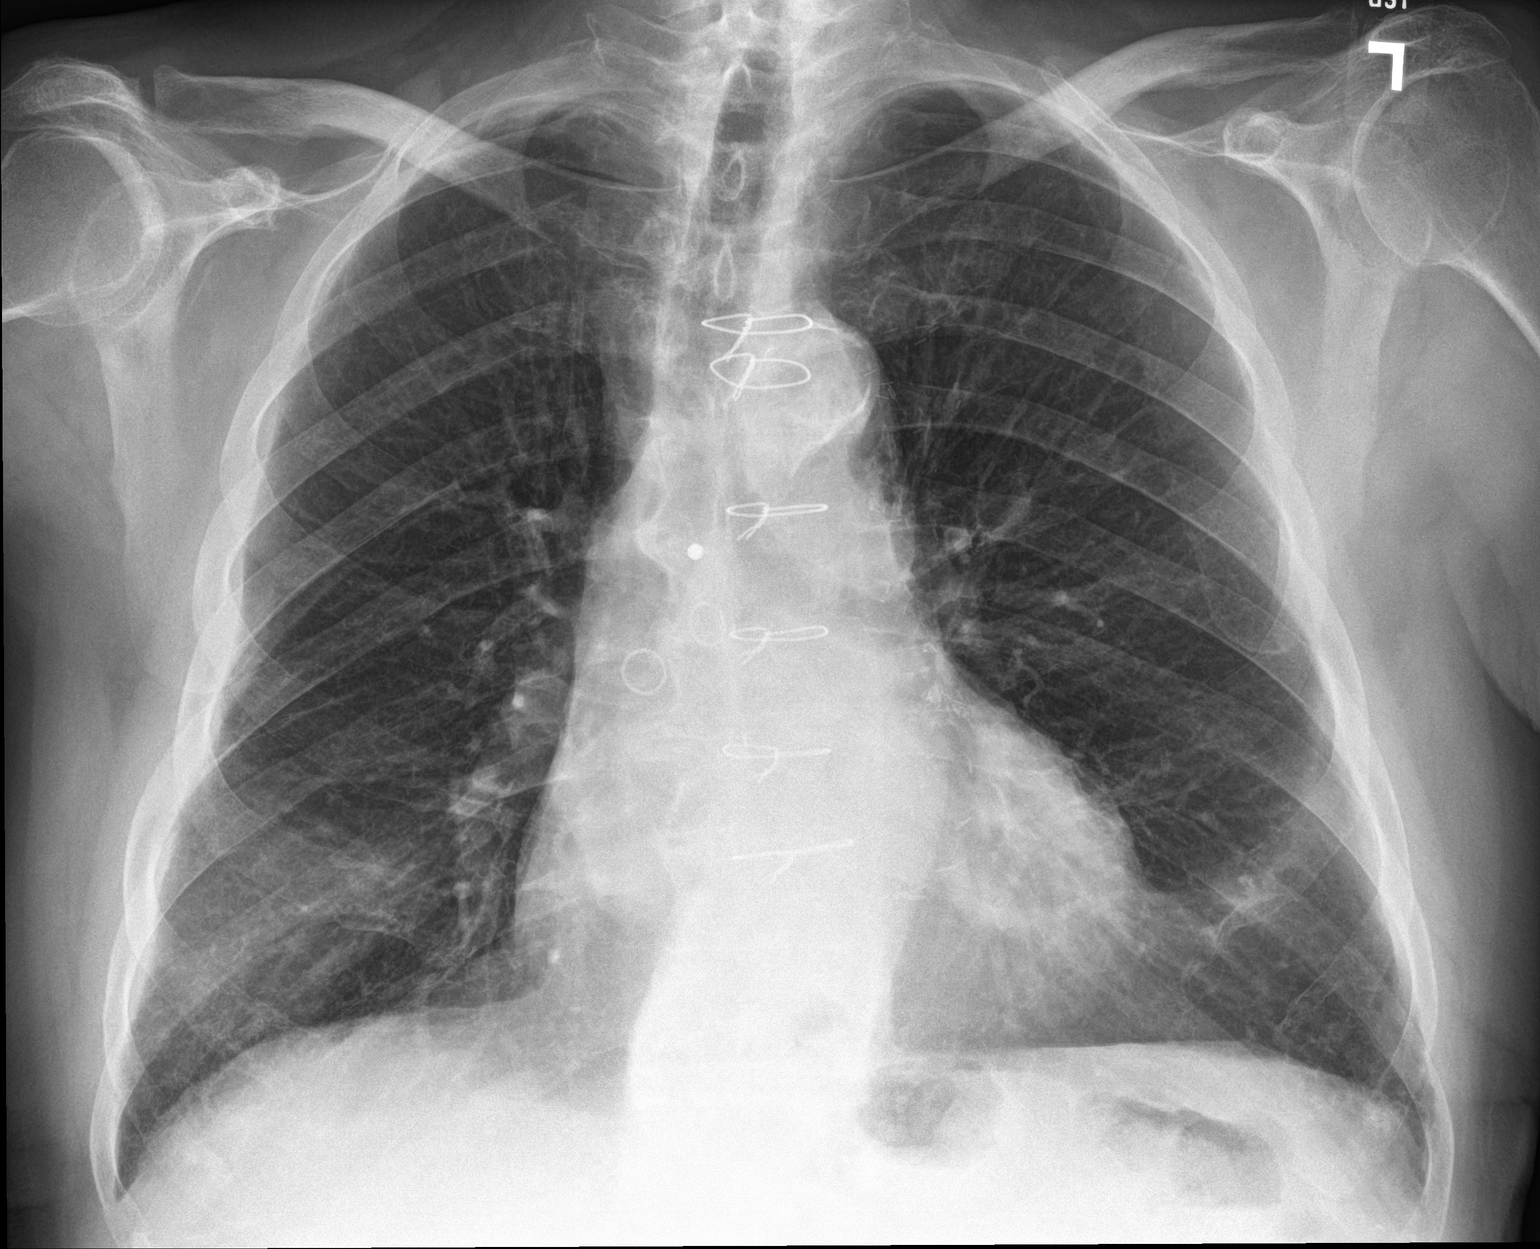

[chest lat (2 of 2)]
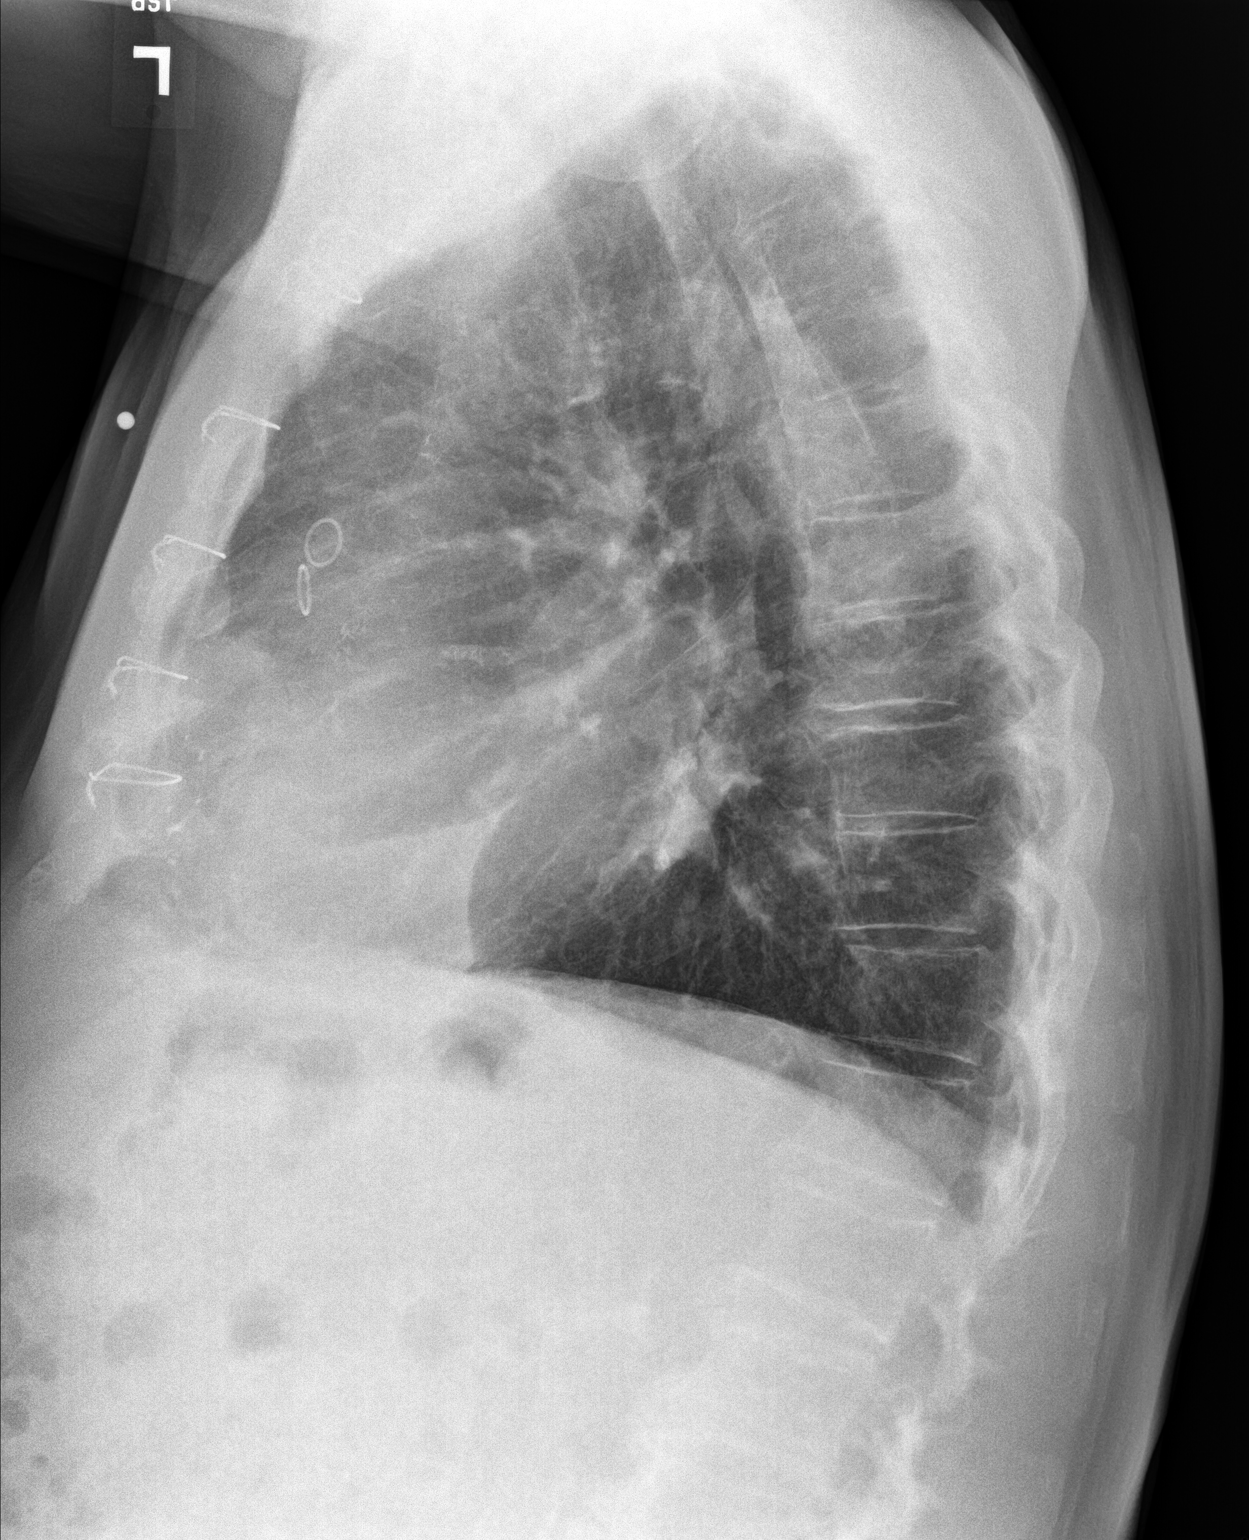

[3 of 3 positions shown; findings below may reference images not displayed]

FINDINGS: Prior CABG. Heart is borderline in size. No confluent airspace
opacities or effusions. No acute bony abnormality. No visible
abnormality in the area of concern, marked with a BB along the upper
sternum.
IMPRESSION: No active cardiopulmonary disease.

No visible abnormality in the area concern along the chest wall
anteriorly.

Prior CABG.

## 2019-01-23 ENCOUNTER — Telehealth: Payer: Self-pay | Admitting: Family Medicine

## 2019-01-23 NOTE — Telephone Encounter (Addendum)
Patient's wife states he received an email advising them to cancel any appointments that was not an emergency.  She is concerned about patient coming in for PT/INR with Sharyn Lull on Wednesday.  Please advise.

## 2019-01-24 NOTE — Telephone Encounter (Signed)
Pt aware that last INR was borderline and he needs this OV with Sharyn Lull for recheck.

## 2019-01-25 ENCOUNTER — Encounter: Payer: Self-pay | Admitting: Pharmacist Clinician (PhC)/ Clinical Pharmacy Specialist

## 2019-01-25 ENCOUNTER — Other Ambulatory Visit: Payer: Self-pay

## 2019-01-25 ENCOUNTER — Ambulatory Visit: Payer: Medicare Other | Admitting: Pharmacist Clinician (PhC)/ Clinical Pharmacy Specialist

## 2019-01-25 DIAGNOSIS — Z7901 Long term (current) use of anticoagulants: Secondary | ICD-10-CM | POA: Diagnosis not present

## 2019-01-25 DIAGNOSIS — I4819 Other persistent atrial fibrillation: Secondary | ICD-10-CM

## 2019-01-25 LAB — COAGUCHEK XS/INR WAIVED
INR: 3.3 — ABNORMAL HIGH (ref 0.9–1.1)
Prothrombin Time: 40 s

## 2019-01-25 NOTE — Patient Instructions (Signed)
Description   No warfarin today, then change to taking 1/2 tablet daily except for Tuesdays, Thursdays, and Sundays 1 tablet.  INR today is  3.3 (goal is 2-3)  A little thin today  Eat a high vitamin K food twice a week

## 2019-02-28 ENCOUNTER — Other Ambulatory Visit: Payer: Self-pay

## 2019-03-01 ENCOUNTER — Ambulatory Visit: Payer: Medicare Other | Admitting: Pharmacist Clinician (PhC)/ Clinical Pharmacy Specialist

## 2019-03-01 ENCOUNTER — Other Ambulatory Visit: Payer: Self-pay

## 2019-03-01 DIAGNOSIS — I4819 Other persistent atrial fibrillation: Secondary | ICD-10-CM | POA: Diagnosis not present

## 2019-03-01 DIAGNOSIS — Z7901 Long term (current) use of anticoagulants: Secondary | ICD-10-CM | POA: Diagnosis not present

## 2019-03-01 LAB — COAGUCHEK XS/INR WAIVED
INR: 1.7 — ABNORMAL HIGH (ref 0.9–1.1)
Prothrombin Time: 19.9 s

## 2019-03-01 NOTE — Patient Instructions (Signed)
Description   Change to taking 1 tablet on Tuesdays, Thursdays, Saturdays, Sundays and 1/2 tablet all other days of the week.  INR today is  1.7  (goal is 2-3)  A little thick today  Continue eating the same amount of high vitamin K foods

## 2019-04-04 ENCOUNTER — Other Ambulatory Visit: Payer: Self-pay

## 2019-04-05 ENCOUNTER — Ambulatory Visit: Payer: Medicare Other | Admitting: Pharmacist Clinician (PhC)/ Clinical Pharmacy Specialist

## 2019-04-05 ENCOUNTER — Other Ambulatory Visit: Payer: Self-pay | Admitting: Family Medicine

## 2019-04-05 ENCOUNTER — Other Ambulatory Visit: Payer: Self-pay | Admitting: Cardiology

## 2019-04-05 DIAGNOSIS — I4819 Other persistent atrial fibrillation: Secondary | ICD-10-CM

## 2019-04-05 DIAGNOSIS — Z7901 Long term (current) use of anticoagulants: Secondary | ICD-10-CM

## 2019-04-05 LAB — COAGUCHEK XS/INR WAIVED
INR: 1.7 — ABNORMAL HIGH (ref 0.9–1.1)
Prothrombin Time: 20.4 s

## 2019-04-05 NOTE — Patient Instructions (Signed)
Description   Change to taking 1 tablet every day except for Mondays and Fridays take 1/2 tablet  INR today is  1.7  (goal is 2-3)  A little thick today  Continue eating the same amount of high vitamin K foods

## 2019-04-21 ENCOUNTER — Other Ambulatory Visit: Payer: Self-pay

## 2019-04-24 ENCOUNTER — Encounter: Payer: Self-pay | Admitting: Family Medicine

## 2019-04-24 ENCOUNTER — Other Ambulatory Visit: Payer: Self-pay

## 2019-04-24 ENCOUNTER — Ambulatory Visit (INDEPENDENT_AMBULATORY_CARE_PROVIDER_SITE_OTHER): Payer: Medicare Other | Admitting: Family Medicine

## 2019-04-24 ENCOUNTER — Ambulatory Visit: Payer: Medicare Other | Admitting: Family Medicine

## 2019-04-24 VITALS — BP 154/82 | HR 78 | Temp 97.0°F | Ht 67.0 in | Wt 181.8 lb

## 2019-04-24 DIAGNOSIS — E785 Hyperlipidemia, unspecified: Secondary | ICD-10-CM

## 2019-04-24 DIAGNOSIS — R7309 Other abnormal glucose: Secondary | ICD-10-CM

## 2019-04-24 DIAGNOSIS — I10 Essential (primary) hypertension: Secondary | ICD-10-CM | POA: Diagnosis not present

## 2019-04-24 DIAGNOSIS — R972 Elevated prostate specific antigen [PSA]: Secondary | ICD-10-CM

## 2019-04-24 DIAGNOSIS — Z7901 Long term (current) use of anticoagulants: Secondary | ICD-10-CM

## 2019-04-24 DIAGNOSIS — I4819 Other persistent atrial fibrillation: Secondary | ICD-10-CM

## 2019-04-24 DIAGNOSIS — E663 Overweight: Secondary | ICD-10-CM

## 2019-04-24 DIAGNOSIS — K219 Gastro-esophageal reflux disease without esophagitis: Secondary | ICD-10-CM

## 2019-04-24 DIAGNOSIS — N400 Enlarged prostate without lower urinary tract symptoms: Secondary | ICD-10-CM

## 2019-04-24 DIAGNOSIS — Z7689 Persons encountering health services in other specified circumstances: Secondary | ICD-10-CM | POA: Diagnosis not present

## 2019-04-24 LAB — COAGUCHEK XS/INR WAIVED
INR: 2 — ABNORMAL HIGH (ref 0.9–1.1)
Prothrombin Time: 23.8 s

## 2019-04-24 LAB — BAYER DCA HB A1C WAIVED: HB A1C (BAYER DCA - WAIVED): 6.2 % (ref <7.0)

## 2019-04-24 MED ORDER — DILTIAZEM HCL ER COATED BEADS 120 MG PO CP24: ORAL_CAPSULE | ORAL | 3 refills | Status: DC

## 2019-04-24 MED ORDER — METOPROLOL SUCCINATE ER 200 MG PO TB24: ORAL_TABLET | ORAL | 3 refills | Status: DC

## 2019-04-24 MED ORDER — LOVASTATIN 40 MG PO TABS: 40.0000 mg | ORAL_TABLET | Freq: Every day | ORAL | 3 refills | Status: DC

## 2019-04-24 MED ORDER — FINASTERIDE 5 MG PO TABS: ORAL_TABLET | ORAL | 3 refills | Status: DC

## 2019-04-24 MED ORDER — FUROSEMIDE 20 MG PO TABS: ORAL_TABLET | ORAL | 3 refills | Status: DC

## 2019-04-24 MED ORDER — EZETIMIBE 10 MG PO TABS: ORAL_TABLET | ORAL | 3 refills | Status: DC

## 2019-04-24 NOTE — Progress Notes (Signed)
BP (!) 154/82   Pulse 78   Temp (!) 97 F (36.1 C) (Oral)   Ht 5' 7"  (1.702 m)   Wt 181 lb 12.8 oz (82.5 kg)   BMI 28.47 kg/m    Subjective:   Patient ID: [name], male    DOB: 1932-09-23, 86 y.o.   MRN: [id number]  HPI: [name] is a 85 y.o. male presenting on 04/24/2019 for Establish Care, Medical Management of Chronic Issues (check up of chronic medical conditions), and Coagulation Disorder   HPI Patient is coming in for Coumadin recheck and anticoagulation today.  He is currently on Cardizem and warfarin for his A. fib.  He denies any major bruising or bleeding.  He denies any chest pain or palpitations.  Hypertension Patient is currently on diltiazem and metoprolol and Lasix, and their blood pressure today is 154/82. Patient denies any lightheadedness or dizziness. Patient denies headaches, blurred vision, chest pains, shortness of breath, or weakness. Denies any side effects from medication and is content with current medication.   Hyperlipidemia Patient is coming in for recheck of his hyperlipidemia. The patient is currently taking lovastatin and Zetia. They deny any issues with myalgias or history of liver damage from it. They deny any focal numbness or weakness or chest pain.   GERD Patient is currently on no medication currently and denies any major issues.  She denies any major symptoms or abdominal pain or belching or burping. She denies any blood in her stool or lightheadedness or dizziness.   Patient is coming in for recheck of [name] and the fact that he had an elevated glucose on his last labs.  He is coming for blood work today for these.  He currently takes finasteride for [name] and denies any major issues with it.  Relevant past medical, surgical, family and social history reviewed and updated as indicated. Interim medical history since our last visit reviewed. Allergies and medications reviewed and updated.  Review of Systems  Constitutional: Negative for  chills and fever.  Eyes: Negative for visual disturbance.  Respiratory: Negative for shortness of breath and wheezing.   Cardiovascular: Negative for chest pain and leg swelling.  Musculoskeletal: Negative for back pain and gait problem.  Skin: Negative for rash.  Neurological: Negative for dizziness, weakness and light-headedness.  All other systems reviewed and are negative.   Per HPI unless specifically indicated above   Allergies as of 04/24/2019      Reactions   Penicillins Hives, Rash   Has patient had a PCN reaction causing immediate rash, facial/tongue/throat swelling, SOB or lightheadedness with hypotension: Yes Has patient had a PCN reaction causing severe rash involving mucus membranes or skin necrosis: Yes Has patient had a PCN reaction that required hospitalization: No Has patient had a PCN reaction occurring within the last 10 years: No If all of the above answers are "NO", then may proceed with Cephalosporin use.   Hct [hydrochlorothiazide] Other (See Comments)   hyper   Statins Other (See Comments)   Myalgia, tolerates low dosages of lovastatin       Medication List       Accurate as of April 24, 2019  8:50 AM. If you have any questions, ask your nurse or doctor.        STOP taking these medications   docusate sodium 100 MG capsule Commonly known as: COLACE Stopped by: Fransisca Kaufmann Dwayn Moravek, MD     TAKE these medications   acetaminophen 650 MG CR tablet  Commonly known as: TYLENOL Take 650 mg by mouth every 8 (eight) hours as needed for pain.   diltiazem 120 MG 24 hr capsule Commonly known as: CARDIZEM CD TAKE ONE (1) CAPSULE EACH DAY   ezetimibe 10 MG tablet Commonly known as: ZETIA TAKE ONE (1) TABLET EACH DAY   finasteride 5 MG tablet Commonly known as: PROSCAR TAKE ONE (1) TABLET EACH DAY   fluticasone 50 MCG/ACT nasal spray Commonly known as: FLONASE Place 1 spray into both nostrils daily as needed for allergies or rhinitis.   furosemide 20  MG tablet Commonly known as: LASIX TAKE ONE (1) TABLET EACH DAY   lovastatin 40 MG tablet Commonly known as: MEVACOR Take 1 tablet (40 mg total) by mouth at bedtime.   metoprolol 200 MG 24 hr tablet Commonly known as: TOPROL-XL TAKE ONE (1) TABLET EACH DAY   SYSTANE OP Place 1 drop into both eyes daily.   Vitamin D3 125 MCG (5000 UT) Caps Take 5,000 Units by mouth every 30 (thirty) days.   warfarin 5 MG tablet Commonly known as: COUMADIN Take as directed by the anticoagulation clinic. If you are unsure how to take this medication, talk to your nurse or doctor. Original instructions: TAKE 1 TAB DAILY ON SAT, SUN, TUE, &THURTAKE 1/2 TAB DAILY ON MON, WED, FRI        Objective:   BP (!) 154/82   Pulse 78   Temp (!) 97 F (36.1 C) (Oral)   Ht 5' 7"  (1.702 m)   Wt 181 lb 12.8 oz (82.5 kg)   BMI 28.47 kg/m   Wt Readings from Last 3 Encounters:  04/24/19 181 lb 12.8 oz (82.5 kg)  12/06/18 184 lb (83.5 kg)  08/24/18 187 lb (84.8 kg)    Physical Exam Vitals signs and nursing note reviewed.  Constitutional:      General: He is not in acute distress.    Appearance: He is well-developed. He is not diaphoretic.  Eyes:     General: No scleral icterus.    Conjunctiva/sclera: Conjunctivae normal.  Neck:     Musculoskeletal: Neck supple.     Thyroid: No thyromegaly.  Cardiovascular:     Rate and Rhythm: Normal rate. Rhythm irregular.     Heart sounds: Normal heart sounds.  Pulmonary:     Effort: Pulmonary effort is normal. No respiratory distress.     Breath sounds: Normal breath sounds. No wheezing.  Lymphadenopathy:     Cervical: No cervical adenopathy.  Skin:    General: Skin is warm and dry.     Findings: No rash.  Neurological:     Mental Status: He is alert and oriented to person, place, and time.     Coordination: Coordination normal.  Psychiatric:        Behavior: Behavior normal.     Description   Continue taking 1 tablet every day except for Mondays  and Fridays take 1/2 tablet  INR today is  2.0  (goal is 2-3)    Continue eating the same amount of high vitamin K foods      Assessment & Plan:   Problem List Items Addressed This Visit      Cardiovascular and Mediastinum   Essential hypertension   Relevant Medications   diltiazem (CARDIZEM CD) 120 MG 24 hr capsule   ezetimibe (ZETIA) 10 MG tablet   furosemide (LASIX) 20 MG tablet   lovastatin (MEVACOR) 40 MG tablet   metoprolol (TOPROL-XL) 200 MG 24 hr tablet  Other Relevant Orders   CMP14+EGFR   Atrial fibrillation, persistent (HCC)   Relevant Medications   diltiazem (CARDIZEM CD) 120 MG 24 hr capsule   ezetimibe (ZETIA) 10 MG tablet   furosemide (LASIX) 20 MG tablet   lovastatin (MEVACOR) 40 MG tablet   metoprolol (TOPROL-XL) 200 MG 24 hr tablet     Digestive   GERD   Relevant Orders   CBC with Differential/Platelet     Genitourinary   [name] with elevated PSA   Relevant Medications   finasteride (PROSCAR) 5 MG tablet   Other Relevant Orders   PSA, total and free     Other   Overweight   Long term current use of anticoagulant therapy - Primary   Relevant Orders   CoaguChek XS/INR Waived   Dyslipidemia   Relevant Medications   ezetimibe (ZETIA) 10 MG tablet   lovastatin (MEVACOR) 40 MG tablet    Other Visit Diagnoses    Elevated glucose       Relevant Orders   Bayer DCA Hb A1c Waived    Continue current Coumadin dosing, looking good today, continue finasteride and cholesterol medication and A. fib and blood pressure medication, his blood pressure at home looks good in the 130s and 140s, will continue to monitor   Follow up plan: Return in about 6 weeks (around 06/05/2019), or if symptoms worsen or fail to improve, for Coumadin recheck.  Counseling provided for all of the vaccine components Orders Placed This Encounter  Procedures  . CoaguChek XS/INR Waived  . CBC with Differential/Platelet  . PSA, total and free  . Bayer DCA Hb A1c Waived  .  Greg Zaccai Chavarin, MD [facility] 04/24/2019, 8:50 AM

## 2019-04-25 LAB — CBC WITH DIFFERENTIAL/PLATELET
Basophils Absolute: 0.1 10*3/uL (ref 0.0–0.2)
Basos: 1 %
EOS (ABSOLUTE): 0.5 10*3/uL — ABNORMAL HIGH (ref 0.0–0.4)
Eos: 4 %
Hematocrit: 42.8 % (ref 37.5–51.0)
Hemoglobin: 14.8 g/dL (ref 13.0–17.7)
Immature Grans (Abs): 0 10*3/uL (ref 0.0–0.1)
Immature Granulocytes: 0 %
Lymphocytes Absolute: 5.1 10*3/uL — ABNORMAL HIGH (ref 0.7–3.1)
Lymphs: 40 %
MCH: 32.3 pg (ref 26.6–33.0)
MCHC: 34.6 g/dL (ref 31.5–35.7)
MCV: 93 fL (ref 79–97)
Monocytes Absolute: 0.8 10*3/uL (ref 0.1–0.9)
Monocytes: 6 %
Neutrophils Absolute: 6.3 10*3/uL (ref 1.4–7.0)
Neutrophils: 49 %
Platelets: 217 10*3/uL (ref 150–450)
RBC: 4.58 x10E6/uL (ref 4.14–5.80)
RDW: 12 % (ref 11.6–15.4)
WBC: 12.7 10*3/uL — ABNORMAL HIGH (ref 3.4–10.8)

## 2019-04-25 LAB — CMP14+EGFR
ALT: 12 IU/L (ref 0–44)
AST: 18 IU/L (ref 0–40)
Albumin/Globulin Ratio: 1.7 (ref 1.2–2.2)
Albumin: 4.3 g/dL (ref 3.6–4.6)
Alkaline Phosphatase: 62 IU/L (ref 39–117)
BUN/Creatinine Ratio: 12 (ref 10–24)
BUN: 13 mg/dL (ref 8–27)
Bilirubin Total: 0.5 mg/dL (ref 0.0–1.2)
CO2: 23 mmol/L (ref 20–29)
Calcium: 9.6 mg/dL (ref 8.6–10.2)
Chloride: 103 mmol/L (ref 96–106)
Creatinine, Ser: 1.13 mg/dL (ref 0.76–1.27)
GFR calc Af Amer: 69 mL/min/{1.73_m2} (ref >59)
GFR calc non Af Amer: 59 mL/min/{1.73_m2} — ABNORMAL LOW (ref >59)
Globulin, Total: 2.6 g/dL (ref 1.5–4.5)
Glucose: 93 mg/dL (ref 65–99)
Potassium: 4.4 mmol/L (ref 3.5–5.2)
Sodium: 142 mmol/L (ref 134–144)
Total Protein: 6.9 g/dL (ref 6.0–8.5)

## 2019-04-25 LAB — PSA, TOTAL AND FREE
PSA, Free Pct: 12.2 %
PSA, Free: 0.89 ng/mL
Prostate Specific Ag, Serum: 7.3 ng/mL — ABNORMAL HIGH (ref 0.0–4.0)

## 2019-04-27 ENCOUNTER — Telehealth: Payer: Self-pay | Admitting: Family Medicine

## 2019-04-27 NOTE — Telephone Encounter (Signed)
Aware lipid panel added - sheet taken to lab

## 2019-04-28 ENCOUNTER — Encounter: Payer: Self-pay | Admitting: *Deleted

## 2019-04-29 ENCOUNTER — Other Ambulatory Visit: Payer: Self-pay | Admitting: Family Medicine

## 2019-04-30 LAB — LIPID PANEL
Chol/HDL Ratio: 3.3 ratio (ref 0.0–5.0)
Cholesterol, Total: 165 mg/dL (ref 100–199)
HDL: 50 mg/dL (ref >39)
LDL Calculated: 91 mg/dL (ref 0–99)
Triglycerides: 120 mg/dL (ref 0–149)
VLDL Cholesterol Cal: 24 mg/dL (ref 5–40)

## 2019-04-30 LAB — SPECIMEN STATUS REPORT

## 2019-06-07 ENCOUNTER — Other Ambulatory Visit: Payer: Self-pay

## 2019-06-07 ENCOUNTER — Encounter: Payer: Self-pay | Admitting: Family Medicine

## 2019-06-07 ENCOUNTER — Ambulatory Visit: Payer: Medicare Other | Admitting: Family Medicine

## 2019-06-07 ENCOUNTER — Ambulatory Visit (INDEPENDENT_AMBULATORY_CARE_PROVIDER_SITE_OTHER): Payer: Medicare Other | Admitting: Family Medicine

## 2019-06-07 VITALS — BP 148/73 | HR 74 | Temp 98.2°F | Ht 67.0 in | Wt 185.0 lb

## 2019-06-07 DIAGNOSIS — Z7901 Long term (current) use of anticoagulants: Secondary | ICD-10-CM

## 2019-06-07 DIAGNOSIS — I4819 Other persistent atrial fibrillation: Secondary | ICD-10-CM

## 2019-06-07 LAB — COAGUCHEK XS/INR WAIVED
INR: 2.2 — ABNORMAL HIGH (ref 0.9–1.1)
Prothrombin Time: 26.1 s

## 2019-06-07 MED ORDER — LOVASTATIN 40 MG PO TABS: 40.0000 mg | ORAL_TABLET | Freq: Every day | ORAL | 1 refills | Status: DC

## 2019-06-07 MED ORDER — FUROSEMIDE 20 MG PO TABS: ORAL_TABLET | ORAL | 1 refills | Status: DC

## 2019-06-07 MED ORDER — METOPROLOL SUCCINATE ER 200 MG PO TB24: ORAL_TABLET | ORAL | 1 refills | Status: DC

## 2019-06-07 MED ORDER — EZETIMIBE 10 MG PO TABS: ORAL_TABLET | ORAL | 1 refills | Status: DC

## 2019-06-07 MED ORDER — DILTIAZEM HCL ER COATED BEADS 120 MG PO CP24: ORAL_CAPSULE | ORAL | 1 refills | Status: DC

## 2019-06-07 MED ORDER — WARFARIN SODIUM 5 MG PO TABS: ORAL_TABLET | ORAL | 1 refills | Status: DC

## 2019-06-07 MED ORDER — LOVASTATIN 20 MG PO TABS: 20.0000 mg | ORAL_TABLET | Freq: Every day | ORAL | 1 refills | Status: DC

## 2019-06-07 MED ORDER — FINASTERIDE 5 MG PO TABS: ORAL_TABLET | ORAL | 1 refills | Status: DC

## 2019-06-07 NOTE — Progress Notes (Signed)
Chief Complaint  Patient presents with  . Recheck protime    HPI  Patient presents today for Patient in for follow-up of atrial fibrillation. Patient denies any recent bouts of chest pain or palpitations. Additionally, patient is taking anticoagulants. Patient denies any recent excessive bleeding episodes including epistaxis, bleeding from the gums, genitalia, rectal bleeding or hematuria. Additionally there has been no excessive bruising.  PMH: Smoking status noted ROS: Per HPI  Objective: BP (!) 148/73   Pulse 74   Temp 98.2 F (36.8 C) (Oral)   Ht 5\' 7"  (1.702 m)   Wt 185 lb (83.9 kg)   BMI 28.98 kg/m  Gen: NAD, alert, cooperative with exam HEENT: NCAT, EOMI, PERRL CV: RRR, good S1/S2, no murmur Resp: CTABL, no wheezes, non-labor Ext: No edema, warm Neuro: Alert and oriented, No gross deficits Results for orders placed or performed in visit on 06/07/19  CoaguChek XS/INR Waived  Result Value Ref Range   INR 2.2 (H) 0.9 - 1.1   Prothrombin Time 26.1 sec    Assessment and plan:  1. Long term current use of anticoagulant therapy   2. Atrial fibrillation, persistent     Meds ordered this encounter  Medications  . warfarin (COUMADIN) 5 MG tablet    Sig: TAKE 1 TAB DAILY ON SAT, SUN, TUE, &THURTAKE 1/2 TAB DAILY ON MON, WED, FRI    Dispense:  70 tablet    Refill:  1  . metoprolol (TOPROL-XL) 200 MG 24 hr tablet    Sig: TAKE ONE (1) TABLET EACH DAY    Dispense:  90 tablet    Refill:  1  . lovastatin (MEVACOR) 20 MG tablet    Sig: Take 1 tablet (20 mg total) by mouth at bedtime.    Dispense:  90 tablet    Refill:  1  . furosemide (LASIX) 20 MG tablet    Sig: TAKE ONE (1) TABLET EACH DAY    Dispense:  90 tablet    Refill:  1  . finasteride (PROSCAR) 5 MG tablet    Sig: TAKE ONE (1) TABLET EACH DAY    Dispense:  90 tablet    Refill:  1  . ezetimibe (ZETIA) 10 MG tablet    Sig: TAKE ONE (1) TABLET EACH DAY    Dispense:  90 tablet    Refill:  1  . diltiazem  (CARDIZEM CD) 120 MG 24 hr capsule    Sig: TAKE ONE (1) CAPSULE EACH DAY    Dispense:  90 capsule    Refill:  1  . lovastatin (MEVACOR) 40 MG tablet    Sig: Take 1 tablet (40 mg total) by mouth at bedtime.    Dispense:  90 tablet    Refill:  1    Orders Placed This Encounter  Procedures  . CoaguChek XS/INR Waived    Follow up as needed.  Claretta Fraise, MD

## 2019-06-20 DIAGNOSIS — H26492 Other secondary cataract, left eye: Secondary | ICD-10-CM | POA: Diagnosis not present

## 2019-06-20 DIAGNOSIS — H25811 Combined forms of age-related cataract, right eye: Secondary | ICD-10-CM | POA: Diagnosis not present

## 2019-06-20 DIAGNOSIS — Z961 Presence of intraocular lens: Secondary | ICD-10-CM | POA: Diagnosis not present

## 2019-07-10 DIAGNOSIS — L820 Inflamed seborrheic keratosis: Secondary | ICD-10-CM | POA: Diagnosis not present

## 2019-07-18 ENCOUNTER — Other Ambulatory Visit: Payer: Self-pay

## 2019-07-19 ENCOUNTER — Encounter: Payer: Self-pay | Admitting: Family Medicine

## 2019-07-19 ENCOUNTER — Ambulatory Visit (INDEPENDENT_AMBULATORY_CARE_PROVIDER_SITE_OTHER): Payer: Medicare Other | Admitting: Family Medicine

## 2019-07-19 VITALS — BP 137/82 | HR 83 | Temp 94.8°F | Ht 67.0 in | Wt 187.4 lb

## 2019-07-19 DIAGNOSIS — I4819 Other persistent atrial fibrillation: Secondary | ICD-10-CM

## 2019-07-19 DIAGNOSIS — Z7901 Long term (current) use of anticoagulants: Secondary | ICD-10-CM | POA: Diagnosis not present

## 2019-07-19 DIAGNOSIS — Z23 Encounter for immunization: Secondary | ICD-10-CM

## 2019-07-19 LAB — COAGUCHEK XS/INR WAIVED
INR: 1.6 — ABNORMAL HIGH (ref 0.9–1.1)
Prothrombin Time: 19.4 s

## 2019-07-19 NOTE — Progress Notes (Signed)
BP 137/82   Pulse 83   Temp (!) 94.8 F (34.9 C) (Temporal)   Ht 5\' 7"  (1.702 m)   Wt 187 lb 6.4 oz (85 kg)   BMI 29.35 kg/m    Subjective:   Patient ID: [name], male    DOB: 02/08/1932, 85 y.o.   MRN: [id number]  HPI: [name] is a 85 y.o. male presenting on 07/19/2019 for Coagulation Disorder   HPI Patient is coming in today for Coumadin and anticoagulation recheck.  He denies any bruising or bleeding or palpitations or flutters.  He says overall for the most part things are going pretty well.  He is a little thick today on his blood work.  His INR is 1.6  Relevant past medical, surgical, family and social history reviewed and updated as indicated. Interim medical history since our last visit reviewed. Allergies and medications reviewed and updated.  Review of Systems  Constitutional: Negative for chills and fever.  Respiratory: Negative for shortness of breath and wheezing.   Cardiovascular: Negative for chest pain and leg swelling.  Gastrointestinal: Negative for blood in stool.  Genitourinary: Negative for hematuria.  Skin: Negative for rash.  All other systems reviewed and are negative.   Per HPI unless specifically indicated above   Allergies as of 07/19/2019      Reactions   Penicillins Hives, Rash   Has patient had a PCN reaction causing immediate rash, facial/tongue/throat swelling, SOB or lightheadedness with hypotension: Yes Has patient had a PCN reaction causing severe rash involving mucus membranes or skin necrosis: Yes Has patient had a PCN reaction that required hospitalization: No Has patient had a PCN reaction occurring within the last 10 years: No If all of the above answers are "NO", then may proceed with Cephalosporin use.   Hct [hydrochlorothiazide] Other (See Comments)   hyper   Statins Other (See Comments)   Myalgia, tolerates low dosages of lovastatin       Medication List       Accurate as of July 19, 2019 11:17 AM. If you  have any questions, ask your nurse or doctor.        acetaminophen 650 MG CR tablet Commonly known as: TYLENOL Take 650 mg by mouth every 8 (eight) hours as needed for pain.   diltiazem 120 MG 24 hr capsule Commonly known as: CARDIZEM CD TAKE ONE (1) CAPSULE EACH DAY   ezetimibe 10 MG tablet Commonly known as: ZETIA TAKE ONE (1) TABLET EACH DAY   finasteride 5 MG tablet Commonly known as: PROSCAR TAKE ONE (1) TABLET EACH DAY   fluticasone 50 MCG/ACT nasal spray Commonly known as: FLONASE Place 1 spray into both nostrils daily as needed for allergies or rhinitis.   furosemide 20 MG tablet Commonly known as: LASIX TAKE ONE (1) TABLET EACH DAY   lovastatin 20 MG tablet Commonly known as: MEVACOR Take 1 tablet (20 mg total) by mouth at bedtime.   lovastatin 40 MG tablet Commonly known as: MEVACOR Take 1 tablet (40 mg total) by mouth at bedtime.   metoprolol 200 MG 24 hr tablet Commonly known as: TOPROL-XL TAKE ONE (1) TABLET EACH DAY   SYSTANE OP Place 1 drop into both eyes daily.   Vitamin D3 125 MCG (5000 UT) Caps Take 5,000 Units by mouth every 30 (thirty) days.   warfarin 5 MG tablet Commonly known as: COUMADIN Take as directed by the anticoagulation clinic. If you are unsure how to take this medication, talk  to your nurse or doctor. Original instructions: TAKE 1 TAB DAILY ON SAT, SUN, TUE, &THURTAKE 1/2 TAB DAILY ON MON, WED, FRI        Objective:   BP 137/82   Pulse 83   Temp (!) 94.8 F (34.9 C) (Temporal)   Ht 5\' 7"  (1.702 m)   Wt 187 lb 6.4 oz (85 kg)   BMI 29.35 kg/m   Wt Readings from Last 3 Encounters:  07/19/19 187 lb 6.4 oz (85 kg)  06/07/19 185 lb (83.9 kg)  04/24/19 181 lb 12.8 oz (82.5 kg)    Physical Exam Vitals signs and nursing note reviewed.  Constitutional:      General: He is not in acute distress.    Appearance: He is well-developed. He is not diaphoretic.  Eyes:     General: No scleral icterus.    Conjunctiva/sclera:  Conjunctivae normal.  Neck:     Thyroid: No thyromegaly.  Skin:    General: Skin is warm and dry.     Findings: No rash.  Neurological:     Mental Status: He is alert and oriented to person, place, and time.     Coordination: Coordination normal.     Description   Continue taking 1 tablet every day except for Mondays take 1/2 tablet  INR today is  1.6  (goal is 2-3)    Recheck in 4 weeks      Assessment & Plan:   Problem List Items Addressed This Visit      Cardiovascular and Mediastinum   Atrial fibrillation, persistent ([facility])     Other   Long term current use of anticoagulant therapy - Primary   Relevant Orders   CoaguChek XS/INR Waived       Follow up plan: Return in about 4 weeks (around 08/16/2019), or if symptoms worsen or fail to improve, for Coumadin and anticoagulation recheck.  Counseling provided for all of the vaccine components Orders Placed This Encounter  Procedures  . CoaguChek XS/INR [place], MD [facility] 07/19/2019, 11:17 AM

## 2019-08-03 ENCOUNTER — Other Ambulatory Visit: Payer: Self-pay

## 2019-08-03 DIAGNOSIS — H02834 Dermatochalasis of left upper eyelid: Secondary | ICD-10-CM | POA: Diagnosis not present

## 2019-08-03 DIAGNOSIS — H26492 Other secondary cataract, left eye: Secondary | ICD-10-CM | POA: Diagnosis not present

## 2019-08-03 DIAGNOSIS — H25813 Combined forms of age-related cataract, bilateral: Secondary | ICD-10-CM | POA: Diagnosis not present

## 2019-08-03 DIAGNOSIS — H02831 Dermatochalasis of right upper eyelid: Secondary | ICD-10-CM | POA: Diagnosis not present

## 2019-08-03 DIAGNOSIS — H02031 Senile entropion of right upper eyelid: Secondary | ICD-10-CM | POA: Diagnosis not present

## 2019-08-03 NOTE — Patient Outreach (Signed)
[name]) Care Management  08/03/2019  [name] Oct 24, 1931 [id number]   Medication Adherence call to [name] [facility] Verify spoke with patient wife she explain he is taking Lovastatin 20 mg he is on a sliding scale (one day he takes a full tablet on the others days he doesn't take a full tablet he takes a half of tablet) patient explain he has medication at this time. [name] is showing past due under [facility].  [facility] Direct Dial [phone]  Fax [phone] [email or web address]

## 2019-08-08 NOTE — H&P (Signed)
Surgical History & Physical  Patient Name: [name] DOB: [birth year]  Surgery: Cataract extraction with intraocular lens implant phacoemulsification; Right Eye  Surgeon: Baruch Goldmann MD Surgery Date:  08/18/2019 Pre-Op Date:  08/03/2019  HPI: A 43 Yr. old male patient -referred by Dr. Celestia Khat for [place] OD and PCF OS 1. The patient complains of difficulty when driving, which began 6 months ago. The right eye is affected. The episode is constant and gradual. The patient describes foggy and hazy symptoms affecting their eyes/vision. The condition's severity is moderate. Symptoms occur when the patient is driving and reading. OS VA much worse c/w OD. Was suppose to come in 1 year ago but had to have hip replacement with increased cardiac problems. Patient unable to see with OS for several months. Using OD only but that is blurred as well. The cataract in the right eye is causing blurred vision and is negatively affecting his quality of life. The film behind the left eye is causing very blurry vision and This is negatively affecting the patient's quality of life. Significant glare OU-no longer driving unless absolutely necessary.  Medical History: Cataracts RUL cancer removed Arthritis Cancer Heart Problem High Blood Pressure LDL enlarged prostate h/o blood clots in bladder  Review of Systems Cardiovascular palpitations Eyes blurred VA Hemotologic/Lymphatic Bruises easily Musculoskeletal Joint Ache, Pain All recorded systems are negative except as noted above.  Social   Former smoker   Medication Amiodarone, Diltiazem, Ezetimibe, Finasteride, Fluticasone, Furosemide, Lovastatin, Metoprolol,   Sx/Procedures Cancer removal lid, PC IOL OS, Laser - Yag Capsulotomy,  Cardiac bypass (5 way), Appendectomy, H/o blood clots in bladder,   Drug Allergies  PCN, Sedation,   History & Physical: Heent:  Cataract, Right eye NECK: supple without bruits LUNGS: lungs clear to  auscultation CV: regular rate and rhythm Abdomen: soft and non-tender  Impression & Plan: Assessment: 1.  COMBINED FORMS AGE RELATED CATARACT; Right Eye (H25.811) 2.  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  DERMATOCHALASIS, no surgery; Right Upper Lid, Left Upper Lid (H02.831, H02.834) 4.  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  BLEPHARITIS; Right Upper Lid, Right Lower Lid, Left Upper Lid, Left Lower Lid (H01.001, H01.002,H01.004,H01.005)  Plan: 1.  Cataract accounts for the patient's decreased vision. This visual impairment is not correctable with a tolerable change in glasses or contact lenses. Cataract surgery with an implantation of a new lens should significantly improve the visual and functional status of the patient. Discussed all risks, benefits, alternatives, and potential complications. Discussed the procedures and recovery. Patient desires to have surgery. A-scan ordered and performed today for intra-ocular lens calculations. The surgery will be performed in order to improve vision for driving, reading, and for eye examinations.  Recommend phacoemulsification with intra-ocular lens. Right Eye. Surgery required to correct imbalance of vision. Dilates poorly - shugacaine by protocol. Malyugin Ring in room [facility]. Possible Flomax in past. 2.  Symptomatic and visually significant. Accounts for patient's symptoms and is negatively affecting the patient's quality of life. Reviewed risks of the Yag laser including IOP spike, retinal detachment, and chronic inflammation. Patient wishes to proceed. Yag Laser was successfully performed on the affected side today without complications. 1-2 Weeks Postop/IOP Check. 3.  Asymptomatic, recommend observation for now. Findings, prognosis and treatment options reviewed. 4.  Stable. Monitor. ATs PRN 5.  Recommend regular lid cleaning

## 2019-08-14 DIAGNOSIS — H25811 Combined forms of age-related cataract, right eye: Secondary | ICD-10-CM | POA: Diagnosis not present

## 2019-08-16 ENCOUNTER — Encounter (HOSPITAL_COMMUNITY)
Admission: RE | Admit: 2019-08-16 | Discharge: 2019-08-16 | Disposition: A | Discharge status: Home / Self Care | Payer: Medicare Other | Source: Ambulatory Visit | Attending: Ophthalmology | Admitting: Ophthalmology

## 2019-08-16 ENCOUNTER — Other Ambulatory Visit (HOSPITAL_COMMUNITY)
Admission: RE | Admit: 2019-08-16 | Discharge: 2019-08-16 | Disposition: A | Discharge status: Home / Self Care | Payer: Medicare Other | Source: Ambulatory Visit | Attending: Ophthalmology | Admitting: Ophthalmology

## 2019-08-16 ENCOUNTER — Other Ambulatory Visit: Payer: Self-pay

## 2019-08-16 DIAGNOSIS — Z20828 Contact with and (suspected) exposure to other viral communicable diseases: Secondary | ICD-10-CM | POA: Diagnosis not present

## 2019-08-16 DIAGNOSIS — Z01812 Encounter for preprocedural laboratory examination: Secondary | ICD-10-CM | POA: Insufficient documentation

## 2019-08-16 LAB — SARS CORONAVIRUS 2 (TAT 6-24 HRS): SARS Coronavirus 2: NEGATIVE

## 2019-08-18 ENCOUNTER — Ambulatory Visit (HOSPITAL_COMMUNITY): Payer: Medicare Other | Admitting: Anesthesiology

## 2019-08-18 ENCOUNTER — Encounter (HOSPITAL_COMMUNITY)
Admission: RE | Disposition: A | Discharge status: Home / Self Care | Payer: Self-pay | Source: Home / Self Care | Attending: Ophthalmology

## 2019-08-18 ENCOUNTER — Ambulatory Visit (HOSPITAL_COMMUNITY)
Admission: RE | Admit: 2019-08-18 | Discharge: 2019-08-18 | Disposition: A | Discharge status: Home / Self Care | Payer: Medicare Other | Attending: Ophthalmology | Admitting: Ophthalmology

## 2019-08-18 ENCOUNTER — Encounter (HOSPITAL_COMMUNITY): Payer: Self-pay | Admitting: Anesthesiology

## 2019-08-18 DIAGNOSIS — H02031 Senile entropion of right upper eyelid: Secondary | ICD-10-CM | POA: Insufficient documentation

## 2019-08-18 DIAGNOSIS — I2510 Atherosclerotic heart disease of native coronary artery without angina pectoris: Secondary | ICD-10-CM | POA: Diagnosis not present

## 2019-08-18 DIAGNOSIS — H0100B Unspecified blepharitis left eye, upper and lower eyelids: Secondary | ICD-10-CM | POA: Diagnosis not present

## 2019-08-18 DIAGNOSIS — Z85118 Personal history of other malignant neoplasm of bronchus and lung: Secondary | ICD-10-CM | POA: Insufficient documentation

## 2019-08-18 DIAGNOSIS — Z79899 Other long term (current) drug therapy: Secondary | ICD-10-CM | POA: Insufficient documentation

## 2019-08-18 DIAGNOSIS — H0100A Unspecified blepharitis right eye, upper and lower eyelids: Secondary | ICD-10-CM | POA: Diagnosis not present

## 2019-08-18 DIAGNOSIS — I4891 Unspecified atrial fibrillation: Secondary | ICD-10-CM | POA: Diagnosis not present

## 2019-08-18 DIAGNOSIS — H02831 Dermatochalasis of right upper eyelid: Secondary | ICD-10-CM | POA: Insufficient documentation

## 2019-08-18 DIAGNOSIS — N400 Enlarged prostate without lower urinary tract symptoms: Secondary | ICD-10-CM | POA: Insufficient documentation

## 2019-08-18 DIAGNOSIS — Z87891 Personal history of nicotine dependence: Secondary | ICD-10-CM | POA: Diagnosis not present

## 2019-08-18 DIAGNOSIS — H25811 Combined forms of age-related cataract, right eye: Secondary | ICD-10-CM | POA: Diagnosis not present

## 2019-08-18 DIAGNOSIS — H26492 Other secondary cataract, left eye: Secondary | ICD-10-CM | POA: Insufficient documentation

## 2019-08-18 DIAGNOSIS — I110 Hypertensive heart disease with heart failure: Secondary | ICD-10-CM | POA: Diagnosis not present

## 2019-08-18 DIAGNOSIS — I509 Heart failure, unspecified: Secondary | ICD-10-CM | POA: Diagnosis not present

## 2019-08-18 HISTORY — PX: CATARACT EXTRACTION W/PHACO: SHX586

## 2019-08-18 SURGERY — PHACOEMULSIFICATION, CATARACT, WITH IOL INSERTION
Anesthesia: Monitor Anesthesia Care | Site: Eye | Laterality: Right

## 2019-08-18 MED ORDER — LIDOCAINE HCL (PF) 1 % IJ SOLN
INTRAOCULAR | Status: DC | PRN
  Administered 2019-08-18: 1 mL via OPHTHALMIC

## 2019-08-18 MED ORDER — POVIDONE-IODINE 5 % OP SOLN
OPHTHALMIC | Status: DC | PRN
  Administered 2019-08-18: 1 via OPHTHALMIC

## 2019-08-18 MED ORDER — PROVISC 10 MG/ML IO SOLN
INTRAOCULAR | Status: DC | PRN
  Administered 2019-08-18: 0.85 mL via INTRAOCULAR

## 2019-08-18 MED ORDER — PHENYLEPHRINE-KETOROLAC 1-0.3 % IO SOLN
INTRAOCULAR | Status: AC
  Filled 2019-08-18: qty 4

## 2019-08-18 MED ORDER — SODIUM HYALURONATE 23 MG/ML IO SOLN
INTRAOCULAR | Status: DC | PRN
  Administered 2019-08-18: 0.6 mL via INTRAOCULAR

## 2019-08-18 MED ORDER — EPINEPHRINE PF 1 MG/ML IJ SOLN
INTRAMUSCULAR | Status: AC
  Filled 2019-08-18: qty 1

## 2019-08-18 MED ORDER — LIDOCAINE HCL 3.5 % OP GEL
1.0000 "application " | Freq: Once | OPHTHALMIC | Status: AC
  Administered 2019-08-18: 1 via OPHTHALMIC

## 2019-08-18 MED ORDER — TETRACAINE HCL 0.5 % OP SOLN
1.0000 [drp] | OPHTHALMIC | Status: AC | PRN
  Administered 2019-08-18 (×3): 1 [drp] via OPHTHALMIC

## 2019-08-18 MED ORDER — CYCLOPENTOLATE-PHENYLEPHRINE 0.2-1 % OP SOLN
1.0000 [drp] | OPHTHALMIC | Status: AC | PRN
  Administered 2019-08-18 (×3): 1 [drp] via OPHTHALMIC

## 2019-08-18 MED ORDER — BSS IO SOLN
INTRAOCULAR | Status: DC | PRN
  Administered 2019-08-18: 15 mL via INTRAOCULAR

## 2019-08-18 MED ORDER — PHENYLEPHRINE-KETOROLAC 1-0.3 % IO SOLN
INTRAOCULAR | Status: DC | PRN
  Administered 2019-08-18: 09:00:00 500 mL via OPHTHALMIC

## 2019-08-18 MED ORDER — NEOMYCIN-POLYMYXIN-DEXAMETH 3.5-10000-0.1 OP SUSP
OPHTHALMIC | Status: DC | PRN
  Administered 2019-08-18: 2 [drp] via OPHTHALMIC

## 2019-08-18 MED ORDER — PHENYLEPHRINE HCL 2.5 % OP SOLN
1.0000 [drp] | OPHTHALMIC | Status: AC | PRN
  Administered 2019-08-18 (×3): 1 [drp] via OPHTHALMIC

## 2019-08-18 SURGICAL SUPPLY — 13 items

## 2019-08-18 NOTE — Interval H&P Note (Signed)
History and Physical Interval Note: The H and P was reviewed and updated. The patient was examined.  No changes were found after exam.  The surgical eye was marked.  08/18/2019 8:53 AM  [name]  has presented today for surgery, with the diagnosis of Nuclear sclerotic cataract - Right eye.  The various methods of treatment have been discussed with the patient and family. After consideration of risks, benefits and other options for treatment, the patient has consented to  Procedure(s) with comments: CATARACT EXTRACTION PHACO AND INTRAOCULAR LENS PLACEMENT (IOC) (Right) - right as a surgical intervention.  The patient's history has been reviewed, patient examined, no change in status, stable for surgery.  I have reviewed the patient's chart and labs.  Questions were answered to the patient's satisfaction.     Baruch Goldmann

## 2019-08-18 NOTE — Op Note (Signed)
Date of procedure: 08/18/19  Pre-operative diagnosis:  Visually significant combined form age-related cataract, Right Eye (H25.811)  Post-operative diagnosis:  Visually significant combined form age-related cataract, Right Eye (H25.811)  Procedure: Removal of cataract via phacoemulsification and insertion of intra-ocular lens [employer] PCB00  +20.0D into the capsular bag of the Right Eye  Attending surgeon: Gerda Diss. Jaishawn Witzke, MD, MA  Anesthesia: MAC, Topical [place]  Complications: None  Estimated Blood Loss: <65m (minimal)  Specimens: None  Implants: As above  Indications:  Visually significant age-related cataract, Right Eye  Procedure:  The patient was seen and identified in the pre-operative area. The operative eye was identified and dilated.  The operative eye was marked.  Topical anesthesia was administered to the operative eye.     The patient was then to the operative suite and placed in the supine position.  A timeout was performed confirming the patient, procedure to be performed, and all other relevant information.   The patient's face was prepped and draped in the usual fashion for intra-ocular surgery.  A lid speculum was placed into the operative eye and the surgical microscope moved into place and focused.  A superotemporal paracentesis was created using a 20 gauge paracentesis blade.  Shugarcaine was injected into the anterior chamber.  Viscoelastic was injected into the anterior chamber.  A temporal clear-corneal main wound incision was created using a 2.444mmicrokeratome.  A continuous curvilinear capsulorrhexis was initiated using an irrigating cystitome and completed using capsulorrhexis forceps.  Hydrodissection and hydrodeliniation were performed.  Viscoelastic was injected into the anterior chamber.  A phacoemulsification handpiece and a chopper as a second instrument were used to remove the nucleus and epinucleus. The irrigation/aspiration handpiece was  used to remove any remaining cortical material.   The capsular bag was reinflated with viscoelastic, checked, and found to be intact.  The intraocular lens was inserted into the capsular bag.  The irrigation/aspiration handpiece was used to remove any remaining viscoelastic.  The clear corneal wound and paracentesis wounds were then hydrated and checked with Weck-Cels to be watertight.  The lid-speculum and drape was removed, and the patient's face was cleaned with a wet and dry 4x4.  Maxitrol was instilled in the eye before a clear shield was taped over the eye. The patient was taken to the post-operative care unit in good condition, having tolerated the procedure well.  Post-Op Instructions: The patient will follow [facility] at [facility] a same day post-operative evaluation and will receive all other orders and instructions.

## 2019-08-18 NOTE — Anesthesia Preprocedure Evaluation (Signed)
Anesthesia Evaluation  General Assessment Comment:States RVR during two surgeries in 2005,2008 also might have happened with THA 2019  Reviewed: reviewed documented beta blocker date and time   History of Anesthesia Complications (+) history of anesthetic complications  Airway Mallampati: II  TM Distance: >3 FB Neck ROM: Full    Dental no notable dental hx. (+) Lower Dentures, Partial Upper   Pulmonary former smoker,    Pulmonary exam normal breath sounds clear to auscultation       Cardiovascular Exercise Tolerance: Good hypertension, Pt. on home beta blockers and Pt. on medications + [name], + Peripheral Vascular Disease and +CHF  Normal cardiovascular exam+ dysrhythmias Atrial Fibrillation I Rhythm:Regular Rate:Normal  Reports h/o Two DCCVs without success -now on toprol /dilt /coumadin    Neuro/Psych    GI/Hepatic PUD, GERD  Medicated and Controlled,  Endo/Other    Renal/GU      Musculoskeletal  (+) Arthritis , Osteoarthritis,    Abdominal   Peds  Hematology   Anesthesia Other Findings   Reproductive/Obstetrics                             Anesthesia Physical Anesthesia Plan  ASA: III  Anesthesia Plan: MAC   Post-op Pain Management:    Induction: Intravenous  PONV Risk Score and Plan: Ondansetron and Treatment may vary due to age or medical condition  Airway Management Planned: Nasal Cannula and Simple Face Mask  Additional Equipment:   Intra-op Plan:   Post-operative Plan:   Informed Consent: I have reviewed the patients History and Physical, chart, labs and discussed the procedure including the risks, benefits and alternatives for the proposed anesthesia with the patient or authorized representative who has indicated his/her understanding and acceptance.     Dental advisory given  Plan Discussed with: CRNA  Anesthesia Plan Comments: (Plan Full PPE use  Plan MAC ,  states anesthesia and him don't mix D/w pt will use as little as we can -voiced understanding WTP Had other eye done remotely )        Anesthesia Quick Evaluation

## 2019-08-18 NOTE — Anesthesia Postprocedure Evaluation (Signed)
Anesthesia Post Note  Patient: [name]  Procedure(s) Performed: CATARACT EXTRACTION PHACO AND INTRAOCULAR LENS PLACEMENT (IOC) (Right Eye)  Patient location during evaluation: Short Stay Anesthesia Type: MAC Level of consciousness: awake and alert Pain management: pain level controlled Vital Signs Assessment: post-procedure vital signs reviewed and stable Respiratory status: spontaneous breathing Cardiovascular status: stable Anesthetic complications: no     Last Vitals:  Vitals:   08/18/19 0830 08/18/19 0839  BP: (!) 189/107 (!) 171/98  Pulse: 70 80  Resp: 16 16  Temp:    SpO2: 99% 98%    Last Pain:  Vitals:   08/18/19 0820  PainSc: 0-No pain                 [name]

## 2019-08-18 NOTE — Discharge Instructions (Signed)
Please discharge patient when stable, will follow up today with Dr. Makylee Sanborn at the [facility] office immediately following discharge.  Leave shield in place until visit.  All paperwork with discharge instructions will be given at the office. ° °

## 2019-08-18 NOTE — Transfer of Care (Signed)
Immediate Anesthesia Transfer of Care Note  Patient: [name]  Procedure(s) Performed: CATARACT EXTRACTION PHACO AND INTRAOCULAR LENS PLACEMENT (IOC) (Right Eye)  Patient Location: Short Stay  Anesthesia Type:MAC  Level of Consciousness: awake, alert , oriented and patient cooperative  Airway & Oxygen Therapy: Patient Spontanous Breathing  Post-op Assessment: Report given to RN and Post -op Vital signs reviewed and stable  Post vital signs: Reviewed and stable  Last Vitals:  Vitals Value Taken Time  BP    Temp    Pulse    Resp    SpO2      Last Pain:  Vitals:   08/18/19 0820  PainSc: 0-No pain         Complications: No apparent anesthesia complications

## 2019-08-21 ENCOUNTER — Encounter (HOSPITAL_COMMUNITY): Payer: Self-pay | Admitting: Ophthalmology

## 2019-08-24 ENCOUNTER — Other Ambulatory Visit: Payer: Self-pay

## 2019-08-25 ENCOUNTER — Encounter: Payer: Self-pay | Admitting: Family Medicine

## 2019-08-25 ENCOUNTER — Ambulatory Visit (INDEPENDENT_AMBULATORY_CARE_PROVIDER_SITE_OTHER): Payer: Medicare Other | Admitting: Family Medicine

## 2019-08-25 VITALS — BP 171/79 | HR 76 | Temp 97.1°F | Ht 67.0 in | Wt 187.2 lb

## 2019-08-25 DIAGNOSIS — Z7901 Long term (current) use of anticoagulants: Secondary | ICD-10-CM | POA: Diagnosis not present

## 2019-08-25 DIAGNOSIS — I10 Essential (primary) hypertension: Secondary | ICD-10-CM | POA: Diagnosis not present

## 2019-08-25 DIAGNOSIS — I4819 Other persistent atrial fibrillation: Secondary | ICD-10-CM | POA: Diagnosis not present

## 2019-08-25 DIAGNOSIS — R7303 Prediabetes: Secondary | ICD-10-CM | POA: Insufficient documentation

## 2019-08-25 DIAGNOSIS — E785 Hyperlipidemia, unspecified: Secondary | ICD-10-CM

## 2019-08-25 LAB — BAYER DCA HB A1C WAIVED: HB A1C (BAYER DCA - WAIVED): 6 % (ref <7.0)

## 2019-08-25 LAB — COAGUCHEK XS/INR WAIVED
INR: 2.1 — ABNORMAL HIGH (ref 0.9–1.1)
Prothrombin Time: 25.3 s

## 2019-08-25 NOTE — Progress Notes (Signed)
BP (!) 171/79   Pulse 76   Temp (!) 97.1 F (36.2 C) (Temporal)   Ht _0  (1.702 m)   Wt 187 lb 3.2 oz (84.9 kg)   SpO2 96%   BMI 29.32 kg/m    Subjective:   Patient ID: [name], male    DOB: 25-Jan-1932, 85 y.o.   MRN: [id number]  HPI: [name] is a 85 y.o. male presenting on 08/25/2019 for Coagulation Disorder and Annual Exam   HPI Is coming in with anticoagulation disorder. Patient is coming in for Coumadin and anticoagulation recheck.  He denies any bruising or bleeding or palpitations or chest pain.  Prediabetes Patient comes in today for recheck of his diabetes. Patient has been currently taking no medication currently and we are just monitoring and is diet controlled. Patient is not currently on an ACE inhibitor/ARB. Patient has not seen an ophthalmologist this year. Patient denies any issues with their feet.   Hypertension Patient is currently on metoprolol and diltiazem, and their blood pressure today is 171/79, says at home it usually runs in the 140s. Patient denies any lightheadedness or dizziness. Patient denies headaches, blurred vision, chest pains, shortness of breath, or weakness. Denies any side effects from medication and is content with current medication.   Hyperlipidemia Patient is coming in for recheck of his hyperlipidemia. The patient is currently taking Zetia and lovastatin. They deny any issues with myalgias or history of liver damage from it. They deny any focal numbness or weakness or chest pain.   Relevant past medical, surgical, family and social history reviewed and updated as indicated. Interim medical history since our last visit reviewed. Allergies and medications reviewed and updated.  Review of Systems  Constitutional: Negative for chills and fever.  Eyes: Negative for visual disturbance.  Respiratory: Negative for shortness of breath and wheezing.   Cardiovascular: Negative for chest pain and leg swelling.  Gastrointestinal:  Negative for anal bleeding and blood in stool.  Genitourinary: Negative for hematuria.  Musculoskeletal: Negative for back pain and gait problem.  Skin: Negative for rash.  Neurological: Negative for dizziness, weakness and light-headedness.  All other systems reviewed and are negative.  Per HPI unless specifically indicated above  Allergies as of 08/25/2019      Reactions   Penicillins Hives, Rash   Has patient had a PCN reaction causing immediate rash, facial/tongue/throat swelling, SOB or lightheadedness with hypotension: Yes Has patient had a PCN reaction causing severe rash involving mucus membranes or skin necrosis: Yes Has patient had a PCN reaction that required hospitalization: No Has patient had a PCN reaction occurring within the last 10 years: No If all of the above answers are "NO", then may proceed with Cephalosporin use.   Hct [hydrochlorothiazide] Other (See Comments)   hyper   Statins Other (See Comments)   Myalgia, tolerates low dosages of lovastatin       Medication List       Accurate as of August 25, 2019 10:35 AM. If you have any questions, ask your nurse or doctor.        acetaminophen 500 MG tablet Commonly known as: TYLENOL Take 1,000 mg by mouth every 6 (six) hours as needed for moderate pain.   diltiazem 120 MG 24 hr capsule Commonly known as: CARDIZEM CD TAKE ONE (1) CAPSULE EACH DAY What changed:   how much to take  how to take this  when to take this  additional instructions   ezetimibe 10 MG  tablet Commonly known as: ZETIA TAKE ONE (1) TABLET EACH DAY What changed:   how much to take  how to take this  when to take this  additional instructions   finasteride 5 MG tablet Commonly known as: PROSCAR TAKE ONE (1) TABLET EACH DAY What changed:   how much to take  how to take this  when to take this  additional instructions   fluticasone 50 MCG/ACT nasal spray Commonly known as: FLONASE Place 1 spray into both  nostrils daily as needed for allergies or rhinitis.   furosemide 20 MG tablet Commonly known as: LASIX TAKE ONE (1) TABLET EACH DAY What changed:   how much to take  how to take this  when to take this  additional instructions   lovastatin 20 MG tablet Commonly known as: MEVACOR Take 1 tablet (20 mg total) by mouth at bedtime. What changed:   how much to take  when to take this   metoprolol 200 MG 24 hr tablet Commonly known as: TOPROL-XL TAKE ONE (1) TABLET EACH DAY What changed:   how much to take  how to take this  when to take this  additional instructions   SYSTANE OP Place 1 drop into both eyes 3 (three) times daily as needed (dry eyes).   Vitamin D3 125 MCG (5000 UT) Caps Take 5,000 Units by mouth once a week.   warfarin 5 MG tablet Commonly known as: COUMADIN Take as directed by the anticoagulation clinic. If you are unsure how to take this medication, talk to your nurse or doctor. Original instructions: TAKE 1 TAB DAILY ON SAT, SUN, TUE, &THURTAKE 1/2 TAB DAILY ON MON, WED, FRI What changed:   how much to take  how to take this  when to take this  additional instructions        Objective:   BP (!) 171/79   Pulse 76   Temp (!) 97.1 F (36.2 C) (Temporal)   Ht _0  (1.702 m)   Wt 187 lb 3.2 oz (84.9 kg)   SpO2 96%   BMI 29.32 kg/m   Wt Readings from Last 3 Encounters:  08/25/19 187 lb 3.2 oz (84.9 kg)  07/19/19 187 lb 6.4 oz (85 kg)  06/07/19 185 lb (83.9 kg)    Physical Exam Vitals signs and nursing note reviewed.  Constitutional:      General: He is not in acute distress.    Appearance: He is well-developed. He is not diaphoretic.  Eyes:     General: No scleral icterus.    Conjunctiva/sclera: Conjunctivae normal.  Neck:     Musculoskeletal: Neck supple.     Thyroid: No thyromegaly.  Cardiovascular:     Rate and Rhythm: Normal rate. Rhythm irregular.  Pulmonary:     Effort: Pulmonary effort is normal. No respiratory  distress.     Breath sounds: Normal breath sounds. No wheezing.  Musculoskeletal: Normal range of motion.  Lymphadenopathy:     Cervical: No cervical adenopathy.  Skin:    General: Skin is warm and dry.     Findings: No rash.  Neurological:     Mental Status: He is alert and oriented to person, place, and time.     Coordination: Coordination normal.  Psychiatric:        Behavior: Behavior normal.       Assessment & Plan:   Problem List Items Addressed This Visit      Cardiovascular and Mediastinum   Essential hypertension   Relevant  Orders   hgba1c (Completed)   Atrial fibrillation, persistent ([place])   Relevant Orders   hgba1c (Completed)     Other   Long term current use of anticoagulant therapy - Primary   Relevant Orders   inr FINGERSTICK (Completed)   Dyslipidemia   Prediabetes   Relevant Orders   CMP14+EGFR (Completed)      Description   Continue taking 1 tablet every day except for Mondays take 1/2 tablet  INR today is  2.1  (goal is 2-3)    Recheck in 6 weeks    Patient was prediabetes and discussed dietary changes and will monitor from there, he says his home blood pressures running good in the 130s and 140s and he gets whitecoat, he will continue to check home blood pressures. Follow up plan: Return in about 7 weeks (around 10/13/2019), or if symptoms worsen or fail to improve, for Coumadin recheck.  Counseling provided for all of the vaccine components Orders Placed This Encounter  Procedures  . inr FINGERSTICK  . hgba1c  . [place] Dellas Guard, MD [facility] 08/25/2019, 10:35 AM

## 2019-08-26 LAB — CMP14+EGFR
ALT: 14 IU/L (ref 0–44)
AST: 18 IU/L (ref 0–40)
Albumin/Globulin Ratio: 1.6 (ref 1.2–2.2)
Albumin: 4.2 g/dL (ref 3.6–4.6)
Alkaline Phosphatase: 64 IU/L (ref 39–117)
BUN/Creatinine Ratio: 13 (ref 10–24)
BUN: 14 mg/dL (ref 8–27)
Bilirubin Total: 0.5 mg/dL (ref 0.0–1.2)
CO2: 25 mmol/L (ref 20–29)
Calcium: 9.3 mg/dL (ref 8.6–10.2)
Chloride: 104 mmol/L (ref 96–106)
Creatinine, Ser: 1.12 mg/dL (ref 0.76–1.27)
GFR calc Af Amer: 69 mL/min/{1.73_m2} (ref >59)
GFR calc non Af Amer: 60 mL/min/{1.73_m2} (ref >59)
Globulin, Total: 2.6 g/dL (ref 1.5–4.5)
Glucose: 95 mg/dL (ref 65–99)
Potassium: 4.4 mmol/L (ref 3.5–5.2)
Sodium: 144 mmol/L (ref 134–144)
Total Protein: 6.8 g/dL (ref 6.0–8.5)

## 2019-08-30 NOTE — Addendum Note (Signed)
Addendum  created 08/30/19 1130 by Ollen Bowl, CRNA   Charge Capture section accepted

## 2019-09-19 DIAGNOSIS — I255 Ischemic cardiomyopathy: Secondary | ICD-10-CM | POA: Insufficient documentation

## 2019-09-19 DIAGNOSIS — Z7189 Other specified counseling: Secondary | ICD-10-CM | POA: Insufficient documentation

## 2019-09-19 NOTE — Progress Notes (Signed)
HPI The patient presents for followup of his coronary disease and atrial fibrillation.   Lexiscan Myoview in June 2018 demonstrated infarct but no ischemia.  EF on echo was 40 - 45%.  This was done to evaluate chest pain.  He returns for follow up.  At the last visit I stopped amiodarone as it appeared that he was in chronic atrial fib.   Since I last saw him he has done well. The patient denies any new symptoms such as chest discomfort, neck or arm discomfort. There has been no new shortness of breath, PND or orthopnea. There have been no reported palpitations, presyncope or syncope.      Allergies  Allergen Reactions  . Penicillins Hives and Rash    Has patient had a PCN reaction causing immediate rash, facial/tongue/throat swelling, SOB or lightheadedness with hypotension: Yes Has patient had a PCN reaction causing severe rash involving mucus membranes or skin necrosis: Yes Has patient had a PCN reaction that required hospitalization: No Has patient had a PCN reaction occurring within the last 10 years: No If all of the above answers are "NO", then may proceed with Cephalosporin use.   . Hct [Hydrochlorothiazide] Other (See Comments)    hyper  . Statins Other (See Comments)    Myalgia, tolerates low dosages of lovastatin     Current Outpatient Medications  Medication Sig Dispense Refill  . acetaminophen (TYLENOL) 500 MG tablet Take 1,000 mg by mouth every 6 (six) hours as needed for moderate pain.    . Cholecalciferol (VITAMIN D3) 5000 units CAPS Take 5,000 Units by mouth once a week.     . diltiazem (CARDIZEM CD) 120 MG 24 hr capsule TAKE ONE (1) CAPSULE EACH DAY (Patient taking differently: Take 120 mg by mouth daily. ) 90 capsule 1  . ezetimibe (ZETIA) 10 MG tablet TAKE ONE (1) TABLET EACH DAY (Patient taking differently: Take 10 mg by mouth daily. ) 90 tablet 1  . finasteride (PROSCAR) 5 MG tablet TAKE ONE (1) TABLET EACH DAY (Patient taking differently: Take 5 mg by mouth  daily. ) 90 tablet 1  . fluticasone (FLONASE) 50 MCG/ACT nasal spray Place 1 spray into both nostrils daily as needed for allergies or rhinitis.    . furosemide (LASIX) 20 MG tablet TAKE ONE (1) TABLET EACH DAY (Patient taking differently: Take 20 mg by mouth daily. ) 90 tablet 1  . lovastatin (MEVACOR) 20 MG tablet Take 1 tablet (20 mg total) by mouth at bedtime. (Patient taking differently: Take 10 mg by mouth every other day. ) 90 tablet 1  . metoprolol (TOPROL-XL) 200 MG 24 hr tablet TAKE ONE (1) TABLET EACH DAY (Patient taking differently: Take 200 mg by mouth daily. ) 90 tablet 1  . Polyethyl Glycol-Propyl Glycol (SYSTANE OP) Place 1 drop into both eyes 3 (three) times daily as needed (dry eyes).     . warfarin (COUMADIN) 5 MG tablet TAKE 1 TAB DAILY ON SAT, SUN, TUE, &THURTAKE 1/2 TAB DAILY ON MON, WED, FRI (Patient taking differently: Take 2.5-5 mg by mouth See admin instructions. Take 5 mg by mouth on Saturday, Sunday, Tuesday and Thursday and 2.5 mg on Monday, Wednesday and Friday) 70 tablet 1   No current facility-administered medications for this visit.     Past Medical History:  Diagnosis Date  . Anemia   . Atrial fibrillation ([place])   . Cataract   . Cellulitis    In past  . Complication of anesthesia  HARD TIME WAKING; CAUSES MY BP TO GO UP   . Coronary artery disease    5 bypasses  . DJD (degenerative joint disease)   . Ejection fraction < 50%    Mildly reduced, 40% by echo  . GERD (gastroesophageal reflux disease)   . Hyperlipidemia   . Hypertension   . Persistent atrial fibrillation ([place])   . Prostate hypertrophy    on CT scan 09/2014  . PUD (peptic ulcer disease)    RESOLVED   . Skin cancer of eyelid    Resected    Past Surgical History:  Procedure Laterality Date  . APPENDECTOMY    . BYPASS GRAFT  2005  . CATARACT EXTRACTION W/PHACO Left 07/22/2015   Procedure: CATARACT EXTRACTION PHACO AND INTRAOCULAR LENS PLACEMENT LEFT EYE CDE=8.14;  Surgeon: Tonny Branch, MD;  Location: [facility];  Service: Ophthalmology;  Laterality: Left;  . CATARACT EXTRACTION W/PHACO Right 08/18/2019   Procedure: CATARACT EXTRACTION PHACO AND INTRAOCULAR LENS PLACEMENT (IOC);  Surgeon: Baruch Goldmann, MD;  Location: [facility];  Service: Ophthalmology;  Laterality: Right;  CDE: 9.74  . CORONARY ARTERY BYPASS GRAFT  4/05   LIMA to LAD, SVG to PDA, SVG to ramus intermediate  . EYE SURGERY  07/2015  . EYELID CARCINOMA EXCISION     Skin cancer resected  . Heart bypass  2005  . TOTAL HIP ARTHROPLASTY     Right  . TOTAL HIP ARTHROPLASTY Left 05/04/2018   Procedure: LEFT TOTAL HIP ARTHROPLASTY;  Surgeon: [name], MD;  Location: [facility];  Service: Orthopedics;  Laterality: Left;  . TOTAL KNEE ARTHROPLASTY     Right     ROS:   As stated in the HPI and negative for all other systems.   PHYSICAL EXAM BP (!) 160/100   Pulse 76   Ht 5' 7.5" (1.715 m)   Wt 190 lb (86.2 kg)   BMI 29.32 kg/m   GENERAL:  Well appearing NECK:  No jugular venous distention, waveform within normal limits, carotid upstroke brisk and symmetric, no bruits, no thyromegaly LUNGS:  Clear to auscultation bilaterally CHEST:  Unremarkable HEART:  PMI not displaced or sustained,S1 and S2 within normal limits, no S3, no clicks, no rubs, no murmurs , irregular ABD:  Flat, positive bowel sounds normal in frequency in pitch, no bruits, no rebound, no guarding, no midline pulsatile mass, no hepatomegaly, no splenomegaly EXT:  2 plus pulses throughout, no edema, no cyanosis no clubbing  EKG:  Atrial fib, rate 76, axis within normal limits, intervals within the limits, no acute ST-T wave changes.  Lab Results  Component Value Date   CHOL 165 04/24/2019   TRIG 120 04/24/2019   HDL 50 04/24/2019   LDLCALC 91 04/24/2019    ASSESSMENT AND PLAN  [name]:  The patient has no new sypmtoms.  No further cardiovascular testing is indicated.  We will continue with aggressive risk reduction and meds as listed.   CM:   His EF has been mildly reduced in the past previous no new symptoms.  No change in therapy.   HTN:  The blood pressure is slightly elevated but he says it is always elevated when he is here and is well controlled in the 229N systolic at home.  No change in therapy.   HYPERLIPIDEMIA:  LDL is 91.  HDL however is very good at 50.  The ratio is reasonable.  No change in therapy.   ATRIAL FIBRILLATION:   [name] has a CHA2DS2 -  VASc score of 4.  He tolerates rate control and anticoagulation.  CAROTID STENOSIS:  He had a Doppler in 2018.  No further imaging is indicated.   COVID EDUCATION:    We talked about this and the vaccine.

## 2019-09-20 ENCOUNTER — Other Ambulatory Visit: Payer: Self-pay

## 2019-09-20 ENCOUNTER — Encounter: Payer: Self-pay | Admitting: Cardiology

## 2019-09-20 ENCOUNTER — Ambulatory Visit: Payer: Medicare Other | Admitting: Cardiology

## 2019-09-20 VITALS — BP 160/100 | HR 76 | Ht 67.5 in | Wt 190.0 lb

## 2019-09-20 DIAGNOSIS — I255 Ischemic cardiomyopathy: Secondary | ICD-10-CM

## 2019-09-20 DIAGNOSIS — I2510 Atherosclerotic heart disease of native coronary artery without angina pectoris: Secondary | ICD-10-CM | POA: Diagnosis not present

## 2019-09-20 DIAGNOSIS — I10 Essential (primary) hypertension: Secondary | ICD-10-CM

## 2019-09-20 DIAGNOSIS — Z7189 Other specified counseling: Secondary | ICD-10-CM

## 2019-09-20 DIAGNOSIS — E785 Hyperlipidemia, unspecified: Secondary | ICD-10-CM | POA: Diagnosis not present

## 2019-09-20 DIAGNOSIS — I482 Chronic atrial fibrillation, unspecified: Secondary | ICD-10-CM

## 2019-09-20 NOTE — Patient Instructions (Signed)
Medication Instructions:  The current medical regimen is effective;  continue present plan and medications.  *If you need a refill on your cardiac medications before your next appointment, please call your pharmacy*  Follow-Up: At [facility], you and your health needs are our priority.  As part of our continuing mission to provide you with exceptional heart care, we have created designated Provider Care Teams.  These Care Teams include your primary Cardiologist (physician) and Advanced Practice Providers (APPs -  Physician Assistants and Nurse Practitioners) who all work together to provide you with the care you need, when you need it.  Your next appointment:   12 month(s)  The format for your next appointment:   In Person  Provider:   James Hochrein, MD  Thank you for choosing [facility]!!     

## 2019-10-16 ENCOUNTER — Other Ambulatory Visit: Payer: Self-pay

## 2019-10-17 ENCOUNTER — Ambulatory Visit (INDEPENDENT_AMBULATORY_CARE_PROVIDER_SITE_OTHER): Payer: Medicare Other | Admitting: Family Medicine

## 2019-10-17 ENCOUNTER — Encounter: Payer: Self-pay | Admitting: Family Medicine

## 2019-10-17 VITALS — BP 159/63 | HR 86 | Temp 96.8°F | Ht 67.5 in | Wt 193.8 lb

## 2019-10-17 DIAGNOSIS — S161XXA Strain of muscle, fascia and tendon at neck level, initial encounter: Secondary | ICD-10-CM

## 2019-10-17 DIAGNOSIS — I4819 Other persistent atrial fibrillation: Secondary | ICD-10-CM

## 2019-10-17 DIAGNOSIS — Z7901 Long term (current) use of anticoagulants: Secondary | ICD-10-CM | POA: Diagnosis not present

## 2019-10-17 LAB — COAGUCHEK XS/INR WAIVED
INR: 2.9 — ABNORMAL HIGH (ref 0.9–1.1)
Prothrombin Time: 34.9 s

## 2019-10-17 NOTE — Progress Notes (Signed)
BP (!) 159/63   Pulse 86   Temp (!) 96.8 F (36 C) (Temporal)   Ht 5' 7.5" (1.715 m)   Wt 193 lb 12.8 oz (87.9 kg)   SpO2 95%   BMI 29.91 kg/m    Subjective:   Patient ID: [name], male    DOB: 04/23/1932, 86 y.o.   MRN: [id number]  HPI: [name] is a 86 y.o. male presenting on 10/17/2019 for Coagulation Disorder and Back Pain (X 1 WEEK)   HPI Patient is coming in for anticoagulation recheck for A. fib on Coumadin Coumadin recheck Target goal: 2.0-3.0 Reason on anticoagulation: A. fib Patient denies any bruising or bleeding or chest pain or palpitations   Patient thinks he pulled a muscle in his back and neck when he was on the lumbar region back for something to pull it and felt a strain about 1 week ago this started, he says is been improving since then but just wanted to mention it.  He has been using Tylenol and stretching.  Relevant past medical, surgical, family and social history reviewed and updated as indicated. Interim medical history since our last visit reviewed. Allergies and medications reviewed and updated.  Review of Systems  Constitutional: Negative for chills and fever.  Eyes: Negative for visual disturbance.  Respiratory: Negative for shortness of breath and wheezing.   Cardiovascular: Negative for chest pain and leg swelling.  Musculoskeletal: Negative for back pain and gait problem.  Skin: Negative for rash.  Neurological: Negative for dizziness, weakness and light-headedness.  All other systems reviewed and are negative.   Per HPI unless specifically indicated above      Objective:   BP (!) 159/63   Pulse 86   Temp (!) 96.8 F (36 C) (Temporal)   Ht 5' 7.5" (1.715 m)   Wt 193 lb 12.8 oz (87.9 kg)   SpO2 95%   BMI 29.91 kg/m   Wt Readings from Last 3 Encounters:  10/17/19 193 lb 12.8 oz (87.9 kg)  09/20/19 190 lb (86.2 kg)  08/25/19 187 lb 3.2 oz (84.9 kg)    Physical Exam Vitals and nursing note reviewed.   Constitutional:      General: He is not in acute distress.    Appearance: He is well-developed. He is not diaphoretic.  Eyes:     General: No scleral icterus.    Conjunctiva/sclera: Conjunctivae normal.  Neck:     Thyroid: No thyromegaly.  Cardiovascular:     Rate and Rhythm: Normal rate. Rhythm irregular.     Heart sounds: Normal heart sounds. No murmur.  Pulmonary:     Effort: Pulmonary effort is normal. No respiratory distress.     Breath sounds: Normal breath sounds. No wheezing.  Musculoskeletal:        General: Normal range of motion.     Cervical back: Neck supple. Tenderness (Bilateral paraspinal muscle tenderness, worse on left) present. No deformity, rigidity or bony tenderness. Pain with movement present.     Thoracic back: Normal.     Lumbar back: Normal.  Lymphadenopathy:     Cervical: No cervical adenopathy.  Skin:    General: Skin is warm and dry.     Findings: No rash.  Neurological:     Mental Status: He is alert and oriented to person, place, and time.     Coordination: Coordination normal.  Psychiatric:        Behavior: Behavior normal.     Description   Continue taking  1 tablet every day except for Mondays take 1/2 tablet  INR today is  2.9  (goal is 2-3)    Recheck in 6 weeks      Assessment & Plan:   Problem List Items Addressed This Visit      Cardiovascular and Mediastinum   Atrial fibrillation, persistent ([place])     Other   Long term current use of anticoagulant therapy - Primary   Relevant Orders   inr FINGERSTICK    Other Visit Diagnoses    Strain of neck muscle, initial encounter       Improving, than a week ago, recommended stretching and heat and TENS unit      Conservative management of neck, if worsens then give [place] a call Follow up plan: Return for Follow-up in 6 to 8 weeks for Coumadin recheck.  Counseling provided for all of the vaccine components Orders Placed This Encounter  Procedures  . inr FINGERSTICK     Caryl Pina, MD [facility] 10/17/2019, 9:20 AM

## 2019-11-21 ENCOUNTER — Ambulatory Visit (INDEPENDENT_AMBULATORY_CARE_PROVIDER_SITE_OTHER): Payer: Medicare Other | Admitting: *Deleted

## 2019-11-21 ENCOUNTER — Other Ambulatory Visit: Payer: Self-pay

## 2019-11-21 DIAGNOSIS — Z0000 Encounter for general adult medical examination without abnormal findings: Secondary | ICD-10-CM | POA: Diagnosis not present

## 2019-11-21 NOTE — Progress Notes (Addendum)
MEDICARE ANNUAL WELLNESS VISIT  11/21/2019  Telephone Visit Disclaimer This Medicare AWV was conducted by telephone due to national recommendations for restrictions regarding the COVID-19 Pandemic (e.g. social distancing).  I verified, using two identifiers, that I am speaking with [name] or their authorized healthcare agent. I discussed the limitations, risks, security, and privacy concerns of performing an evaluation and management service by telephone and the potential availability of an in-person appointment in the future. The patient expressed understanding and agreed to proceed.   Subjective:  [name] is a 86 y.o. male patient of [name], Fransisca Kaufmann, MD who had a Medicare Annual Wellness Visit today via telephone. [name] is Retired and lives with their spouse. he has 1 child. he reports that he is socially active and does interact with friends/family regularly. he is moderately physically active and enjoys going to [employer] [name], fishing and bowling.  Patient Care Team: [name], Fransisca Kaufmann, MD as PCP - General (Family Medicine) Minus Breeding, MD as PCP - Cardiology (Cardiology) Irine Seal, MD as Attending Physician (Urology) Minus Breeding, MD as Consulting Physician (Cardiology) Rogene Houston, MD as Consulting Physician (Gastroenterology) Tonny Branch, MD as Consulting Physician (Ophthalmology) [name], MD as Consulting Physician (Orthopedic Surgery)  Advanced Directives 11/21/2019 08/18/2019 05/04/2018 05/04/2018 04/25/2018 08/13/2016 07/25/2015  Does Patient Have a Medical Advance Directive? No No No No No No No  Type of Advance Directive - - - - - - -  Copy of Healthcare Power of Attorney in Chart? - - - - - - -  Would patient like information on creating a medical advance directive? No - Patient declined No - Patient declined No - Patient declined No - Patient declined No - Patient declined No - patient declined information No - patient declined information     Hospital Utilization Over the Past 12 Months: # of hospitalizations or ER visits: 0 # of surgeries: 1  Review of Systems    Patient reports that his overall health is better compared to last year.  History obtained from chart review  Patient Reported Readings (BP, Pulse, CBG, Weight, etc) none  Pain Assessment Pain : No/denies pain     Current Medications & Allergies (verified) Allergies as of 11/21/2019       Reactions   Penicillins Hives, Rash   Has patient had a PCN reaction causing immediate rash, facial/tongue/throat swelling, SOB or lightheadedness with hypotension: Yes Has patient had a PCN reaction causing severe rash involving mucus membranes or skin necrosis: Yes Has patient had a PCN reaction that required hospitalization: No Has patient had a PCN reaction occurring within the last 10 years: No If all of the above answers are "NO", then may proceed with Cephalosporin use.   Hct [hydrochlorothiazide] Other (See Comments)   hyper   Statins Other (See Comments)   Myalgia, tolerates low dosages of lovastatin         Medication List        Accurate as of November 21, 2019  9:02 AM. If you have any questions, ask your nurse or doctor.          acetaminophen 500 MG tablet Commonly known as: TYLENOL Take 1,000 mg by mouth every 6 (six) hours as needed for moderate pain.   diltiazem 120 MG 24 hr capsule Commonly known as: CARDIZEM CD TAKE ONE (1) CAPSULE EACH DAY What changed:  how much to take how to take this when to take this additional instructions   ezetimibe  10 MG tablet Commonly known as: ZETIA TAKE ONE (1) TABLET EACH DAY What changed:  how much to take how to take this when to take this additional instructions   finasteride 5 MG tablet Commonly known as: PROSCAR TAKE ONE (1) TABLET EACH DAY What changed:  how much to take how to take this when to take this additional instructions   fluticasone 50 MCG/ACT nasal spray Commonly  known as: FLONASE Place 1 spray into both nostrils daily as needed for allergies or rhinitis.   furosemide 20 MG tablet Commonly known as: LASIX TAKE ONE (1) TABLET EACH DAY What changed:  how much to take how to take this when to take this additional instructions   lovastatin 20 MG tablet Commonly known as: MEVACOR Take 1 tablet (20 mg total) by mouth at bedtime. What changed:  how much to take when to take this   metoprolol 200 MG 24 hr tablet Commonly known as: TOPROL-XL TAKE ONE (1) TABLET EACH DAY What changed:  how much to take how to take this when to take this additional instructions   SYSTANE OP Place 1 drop into both eyes 3 (three) times daily as needed (dry eyes).   Vitamin D3 125 MCG (5000 UT) Caps Take 5,000 Units by mouth once a week.   warfarin 5 MG tablet Commonly known as: COUMADIN Take as directed by the anticoagulation clinic. If you are unsure how to take this medication, talk to your nurse or doctor. Original instructions: TAKE 1 TAB DAILY ON SAT, SUN, TUE, &THURTAKE 1/2 TAB DAILY ON MON, WED, FRI What changed:  how much to take how to take this when to take this additional instructions        History (reviewed): Past Medical History:  Diagnosis Date   Anemia    Atrial fibrillation (HCC)    Cataract    Cellulitis    In past   Complication of anesthesia    HARD TIME WAKING; CAUSES MY BP TO GO UP    Coronary artery disease    5 bypasses   DJD (degenerative joint disease)    Ejection fraction < 50%    Mildly reduced, 40% by echo   GERD (gastroesophageal reflux disease)    Hyperlipidemia    Hypertension    Persistent atrial fibrillation ([place])    Prostate hypertrophy    on CT scan 09/2014   PUD (peptic ulcer disease)    RESOLVED    Skin cancer of eyelid    Resected   Past Surgical History:  Procedure Laterality Date   APPENDECTOMY     BYPASS GRAFT  2005   CATARACT EXTRACTION W/PHACO Left 07/22/2015   Procedure: CATARACT  EXTRACTION PHACO AND INTRAOCULAR LENS PLACEMENT LEFT EYE CDE=8.14;  Surgeon: Tonny Branch, MD;  Location: [facility];  Service: Ophthalmology;  Laterality: Left;   CATARACT EXTRACTION W/PHACO Right 08/18/2019   Procedure: CATARACT EXTRACTION PHACO AND INTRAOCULAR LENS PLACEMENT (IOC);  Surgeon: Baruch Goldmann, MD;  Location: [facility];  Service: Ophthalmology;  Laterality: Right;  CDE: 9.74   CORONARY ARTERY BYPASS GRAFT  4/05   LIMA to LAD, SVG to PDA, SVG to ramus intermediate   EYE SURGERY  07/2015   EYELID CARCINOMA EXCISION     Skin cancer resected   Heart bypass  2005   TOTAL HIP ARTHROPLASTY     Right   TOTAL HIP ARTHROPLASTY Left 05/04/2018   Procedure: LEFT TOTAL HIP ARTHROPLASTY;  Surgeon: [name], MD;  Location: [facility];  Service:  Orthopedics;  Laterality: Left;   TOTAL KNEE ARTHROPLASTY     Right   Family History  Problem Relation Age of Onset   Stroke Mother    Stroke Father    Hypertension Father    Early death Brother        75 months old   Cancer Brother        lung   Stroke Brother        heat   Social History   Socioeconomic History   Marital status: Married    Spouse name: [name]   Number of children: 1   Years of education: 10   Highest education level: 10th grade  Occupational History   Occupation: retired  Tobacco Use   Smoking status: Former Smoker    Packs/day: 2.00    Years: 5.00    Pack years: 10.00    Types: Cigarettes    Quit date: 12/23/1963    Years since quitting: 55.9   Smokeless tobacco: Never Used  Substance and Sexual Activity   Alcohol use: No   Drug use: No   Sexual activity: Yes  Other Topics Concern   Not on file  Social History Narrative   Lives in a [place] home.   Social Determinants of Health   Financial Resource Strain: Low Risk    Difficulty of Paying Living Expenses: Not hard at all  Food Insecurity: No Food Insecurity   Worried About Charity fundraiser in the Last Year: Never true   [place] in the Last  Year: Never true  Transportation Needs: No Transportation Needs   Lack of Transportation (Medical): No   Lack of Transportation (Non-Medical): No  Physical Activity: Sufficiently Active   Days of Exercise per Week: 7 days   Minutes of Exercise per Session: 30 min  Stress: No Stress Concern Present   Feeling of Stress : Not at all  Social Connections: Not Isolated   Frequency of Communication with Friends and Family: More than three times a week   Frequency of Social Gatherings with Friends and Family: More than three times a week   Attends Religious Services: More than 4 times per year   Active Member of [employer] or Organizations: Yes   Attends Archivist Meetings: More than 4 times per year   Marital Status: Married    Activities of Daily Living In your present state of health, do you have any difficulty performing the following activities: 11/21/2019  Hearing? Y  Comment hearing isn't as good as it used to be  Vision? N  Comment wears glasses-gets yearly eye exam  Difficulty concentrating or making decisions? N  Walking or climbing stairs? N  Dressing or bathing? N  Doing errands, shopping? N  Preparing Food and eating ? N  Using the Toilet? N  In the past six months, have you accidently leaked urine? Y  Comment dribbles at times-not often  Do you have problems with loss of bowel control? N  Managing your Medications? N  Managing your Finances? N  Housekeeping or managing your Housekeeping? N  Some recent data might be hidden    Patient Education/ Literacy How often do you need to have someone help you when you read instructions, pamphlets, or other written materials from your doctor or pharmacy?: 1 - Never What is the last grade level you completed in school?: 10th grade  Exercise Current Exercise Habits: Home exercise routine, Type of exercise: walking, Time (Minutes): 30, Frequency (Times/Week): 7,  Weekly Exercise (Minutes/Week): 210, Intensity: Mild, Exercise  limited by: cardiac condition(s);orthopedic condition(s)  Diet Patient reports consuming 3 meals a day and 0 snack(s) a day Patient reports that his primary diet is: Regular Patient reports that she does have regular access to food.   Depression Screen PHQ 2/9 Scores 11/21/2019 10/17/2019 08/25/2019 07/19/2019 06/07/2019 04/24/2019 12/06/2018  PHQ - 2 Score 0 0 0 0 0 0 0     Fall Risk Fall Risk  11/21/2019 10/17/2019 08/25/2019 07/19/2019 06/07/2019  Falls in the past year? 0 0 0 0 0     Objective:  [name] seemed alert and oriented and he participated appropriately during our telephone visit.  Blood Pressure Weight BMI  BP Readings from Last 3 Encounters:  10/17/19 (!) 159/63  09/20/19 (!) 160/100  08/25/19 (!) 171/79   Wt Readings from Last 3 Encounters:  10/17/19 193 lb 12.8 oz (87.9 kg)  09/20/19 190 lb (86.2 kg)  08/25/19 187 lb 3.2 oz (84.9 kg)   BMI Readings from Last 1 Encounters:  10/17/19 29.91 kg/m    *Unable to obtain current vital signs, weight, and BMI due to telephone visit type  Hearing/Vision  Garlen did not seem to have difficulty with hearing/understanding during the telephone conversation Reports that he has had a formal eye exam by an eye care professional within the past year Reports that he has not had a formal hearing evaluation within the past year *Unable to fully assess hearing and vision during telephone visit type  Cognitive Function: 6CIT Screen 11/21/2019  What Year? 0 points  What month? 0 points  What time? 0 points  Count back from 20 0 points  Months in reverse 0 points  Repeat phrase 4 points  Total Score 4   (Normal:0-7, Significant for Dysfunction: >8)  Normal Cognitive Function Screening: Yes   Immunization & Health Maintenance Record Immunization History  Administered Date(s) Administered   Fluad Quad(high Dose 65+) 07/19/2019   Influenza, High Dose Seasonal PF 07/07/2016, 07/21/2017, 07/08/2018   Influenza,inj,Quad PF,6+ Mos  07/17/2013, 07/05/2014, 07/17/2015   Pneumococcal Conjugate-13 10/30/2013   Pneumococcal Polysaccharide-23 05/12/2010   Tdap 10/13/2011    Health Maintenance  Topic Date Due   TETANUS/TDAP  10/12/2021   INFLUENZA VACCINE  Completed   PNA vac Low Risk Adult  Completed       Assessment  This is a routine wellness examination for [name].  Health Maintenance: Due or Overdue There are no preventive care reminders to display for this patient.  [name] does not need a referral for Community Assistance: Care Management:   no Social Work:    no Prescription Assistance:  no Nutrition/Diabetes Education:  no   Plan:  Personalized Goals Goals Addressed             This Visit's Progress    DIET - INCREASE WATER INTAKE       Try to drink 6-8 glasses of water daily       Personalized Health Maintenance & Screening Recommendations  Shingles vaccine  Lung Cancer Screening Recommended: no (Low Dose CT Chest recommended if Age 101-80 years, 30 pack-year currently smoking OR have quit w/in past 15 years) Hepatitis C Screening recommended: no HIV Screening recommended: no  Advanced Directives: Written information was not prepared per patient's request.  Referrals & Orders No orders of the defined types were placed in this encounter.   Follow-up Plan Follow-up with [name], Fransisca Kaufmann, MD as planned Consider Shingles vaccine at  your next visit with your PCP   I have personally reviewed and noted the following in the patient's chart:   Medical and social history Use of alcohol, tobacco or illicit drugs  Current medications and supplements Functional ability and status Nutritional status Physical activity Advanced directives List of other physicians Hospitalizations, surgeries, and ER visits in previous 12 months Vitals Screenings to include cognitive, depression, and falls Referrals and appointments  In addition, I have reviewed and discussed with [name] certain preventive protocols, quality metrics, and best practice recommendations. A written personalized care plan for preventive services as well as general preventive health recommendations is available and can be mailed to the patient at his request.      Milas Hock, LPN  02/16/8501   I have reviewed and agree with the above AWV documentation.   Evelina Dun, FNP

## 2019-11-21 NOTE — Patient Instructions (Signed)

## 2019-11-22 ENCOUNTER — Telehealth: Payer: Self-pay | Admitting: *Deleted

## 2019-11-22 NOTE — Telephone Encounter (Signed)
Pt was exposed to [place] on 11/19/19-brother hospitalized and tested positive and he had been in the patients house without a mask on. Can we order test or does pt need televisit?

## 2019-11-22 NOTE — Telephone Encounter (Signed)
I would say go ahead and order the test for them and contact them for testing times

## 2019-11-22 NOTE — Telephone Encounter (Signed)
Given number to make COVID testing appointment.

## 2019-11-23 ENCOUNTER — Ambulatory Visit: Payer: Medicare Other | Attending: Internal Medicine

## 2019-11-23 ENCOUNTER — Other Ambulatory Visit: Payer: Self-pay

## 2019-11-23 DIAGNOSIS — Z20822 Contact with and (suspected) exposure to covid-19: Secondary | ICD-10-CM

## 2019-11-24 LAB — NOVEL CORONAVIRUS, NAA: SARS-CoV-2, NAA: NOT DETECTED

## 2019-11-27 ENCOUNTER — Telehealth: Payer: Self-pay | Admitting: General Practice

## 2019-11-27 NOTE — Telephone Encounter (Signed)
Negative COVID results given. Patient results "NOT Detected." Caller expressed understanding. ° °

## 2019-11-29 ENCOUNTER — Telehealth: Payer: Self-pay | Admitting: Family Medicine

## 2019-11-29 ENCOUNTER — Other Ambulatory Visit: Payer: Self-pay

## 2019-11-29 NOTE — Telephone Encounter (Signed)
Appointment scheduled 12/01/19 at 3:25 pm with Dr. [name] Parisian.

## 2019-11-30 ENCOUNTER — Ambulatory Visit: Payer: Medicare Other | Admitting: Family Medicine

## 2019-12-01 ENCOUNTER — Ambulatory Visit: Payer: Medicare Other | Admitting: Family Medicine

## 2019-12-11 ENCOUNTER — Other Ambulatory Visit: Payer: Self-pay

## 2019-12-12 ENCOUNTER — Encounter: Payer: Self-pay | Admitting: Family Medicine

## 2019-12-12 ENCOUNTER — Ambulatory Visit (INDEPENDENT_AMBULATORY_CARE_PROVIDER_SITE_OTHER): Payer: Medicare Other | Admitting: Family Medicine

## 2019-12-12 DIAGNOSIS — I4819 Other persistent atrial fibrillation: Secondary | ICD-10-CM | POA: Diagnosis not present

## 2019-12-12 DIAGNOSIS — Z7901 Long term (current) use of anticoagulants: Secondary | ICD-10-CM | POA: Diagnosis not present

## 2019-12-12 LAB — COAGUCHEK XS/INR WAIVED
INR: 2.5 — ABNORMAL HIGH (ref 0.9–1.1)
Prothrombin Time: 30.3 s

## 2019-12-12 NOTE — Progress Notes (Signed)
BP 135/73   Pulse 73   Temp 98.9 F (37.2 C)   Ht 5' 7.5" (1.715 m)   Wt 193 lb (87.5 kg)   SpO2 98%   BMI 29.78 kg/m    Subjective:   Patient ID: [name], male    DOB: 11-Apr-1932, 86 y.o.   MRN: [id number]  HPI: [name] is a 86 y.o. male presenting on 12/12/2019 for Medical Management of Chronic Issues   HPI Coumadin recheck Target goal: 2.0-3.0 Reason on anticoagulation: Persistent A. fib Patient denies any bruising or bleeding or chest pain or palpitations   Relevant past medical, surgical, family and social history reviewed and updated as indicated. Interim medical history since our last visit reviewed. Allergies and medications reviewed and updated.  Review of Systems  Constitutional: Negative for chills and fever.  Respiratory: Negative for shortness of breath and wheezing.   Cardiovascular: Negative for chest pain and leg swelling.  Gastrointestinal: Negative for blood in stool.  Genitourinary: Negative for hematuria.  Musculoskeletal: Negative for back pain and gait problem.  Skin: Negative for rash.  Neurological: Negative for dizziness and light-headedness.  All other systems reviewed and are negative.   Per HPI unless specifically indicated above      Objective:   BP 135/73   Pulse 73   Temp 98.9 F (37.2 C)   Ht 5' 7.5" (1.715 m)   Wt 193 lb (87.5 kg)   SpO2 98%   BMI 29.78 kg/m   Wt Readings from Last 3 Encounters:  12/12/19 193 lb (87.5 kg)  10/17/19 193 lb 12.8 oz (87.9 kg)  09/20/19 190 lb (86.2 kg)    Physical Exam Vitals and nursing note reviewed.  Constitutional:      General: He is not in acute distress.    Appearance: He is well-developed. He is not diaphoretic.  Eyes:     General: No scleral icterus.    Conjunctiva/sclera: Conjunctivae normal.  Neck:     Thyroid: No thyromegaly.  Cardiovascular:     Rate and Rhythm: Normal rate. Rhythm irregular.     Heart sounds: Normal heart sounds. No murmur.  Pulmonary:    Effort: Pulmonary effort is normal. No respiratory distress.     Breath sounds: Normal breath sounds. No wheezing.  Musculoskeletal:        General: Normal range of motion.     Cervical back: Neck supple.  Lymphadenopathy:     Cervical: No cervical adenopathy.  Skin:    General: Skin is warm and dry.     Findings: No rash.  Neurological:     Mental Status: He is alert and oriented to person, place, and time.     Coordination: Coordination normal.  Psychiatric:        Behavior: Behavior normal.     Description   Continue taking 1 tablet every day except for Mondays take 1/2 tablet  INR today is  2.5  (goal is 2-3)    Recheck in 6 weeks      Assessment & Plan:   Problem List Items Addressed This Visit      Cardiovascular and Mediastinum   Atrial fibrillation, persistent ([place])   Relevant Orders   CoaguChek XS/INR Waived     Other   Long term current use of anticoagulant therapy   Relevant Orders   CoaguChek XS/INR Waived      Continue current dose, will do more of an extensive blood work panel next time. Follow up plan: Return  in about 6 weeks (around 01/23/2020), or if symptoms worsen or fail to improve, for Prediabetes and INR.  Counseling provided for all of the vaccine components No orders of the defined types were placed in this encounter.   Caryl Pina, MD [facility] 12/12/2019, 8:41 AM

## 2020-01-09 ENCOUNTER — Telehealth: Payer: Self-pay | Admitting: Family Medicine

## 2020-01-09 NOTE — Chronic Care Management (AMB) (Signed)
  Chronic Care Management   Outreach Note  01/09/2020 Name: [name] MRN: [id number] DOB: Jan 15, 1932  [name] is a 86 y.o. year old male who is a primary care patient of [name], Fransisca Kaufmann, MD. I reached out to [name] by phone today in response to a referral sent by [name]'s health plan.     A telephone outreach was attempted today spoke with spouse. The patient was referred to the case management team for assistance with care management and care coordination.   Follow Up Plan: A HIPPA compliant phone message was left for the patient providing contact information and requesting a return call. The care management team will reach out to the patient again over the next 7 days. If patient returns call to provider office, please advise to call [place] at [phone].  [place], [place] [address] Direct Dial: [phone] [email or web address] Website: [email or web address]

## 2020-01-16 NOTE — Chronic Care Management (AMB) (Signed)
  Chronic Care Management   Outreach Note  01/16/2020 Name: [name] MRN: [id number] DOB: 05-13-1932  [name] is a 86 y.o. year old male who is a primary care patient of [name], Fransisca Kaufmann, MD. I reached out to [name] by phone today in response to a referral sent by [name]'s health plan.     A second unsuccessful telephone outreach was attempted today. The patient was referred to the case management team for assistance with care management and care coordination.   Follow Up Plan: A HIPPA compliant phone message was left for the patient providing contact information and requesting a return call. The care management team will reach out to the patient again over the next 7 days. If patient returns call to provider office, please advise to call [place] at [phone].  [place], [place] [address] Direct Dial: [phone] [email or web address] Website: [email or web address]

## 2020-01-22 NOTE — Chronic Care Management (AMB) (Signed)
  Chronic Care Management   Outreach Note  01/22/2020 Name: [name] MRN: [id number] DOB: 1932/06/10  [name] is a 86 y.o. year old male who is a primary care patient of [name], Fransisca Kaufmann, MD. I reached out to [name] by phone today in response to a referral sent by [name]'s health plan.     An unsuccessful telephone outreach was attempted today. The patient was referred to the case management team for assistance with care management and care coordination.   Follow Up Plan: A HIPPA compliant phone message was left for the patient providing contact information and requesting a return call.  If patient returns call to provider office, please advise to call [place] at [phone].  [place], [place] [address] Direct Dial: [phone] [email or web address] Website: [email or web address]

## 2020-01-26 ENCOUNTER — Telehealth: Payer: Self-pay | Admitting: Family Medicine

## 2020-01-26 NOTE — Chronic Care Management (AMB) (Signed)
  Chronic Care Management   Note  01/26/2020 Name: [name] MRN: [id number] DOB: 1932/01/31  [name] is a 86 y.o. year old male who is a primary care patient of [name], Fransisca Kaufmann, MD. I reached out to [name] by phone today in response to a referral sent by [name]'s health plan.     [name] was given information about Chronic Care Management services today including:  1. CCM service includes personalized support from designated clinical staff supervised by his physician, including individualized plan of care and coordination with other care providers 2. 24/7 contact phone numbers for assistance for urgent and routine care needs. 3. Service will only be billed when office clinical staff spend 20 minutes or more in a month to coordinate care. 4. Only one practitioner may furnish and bill the service in a calendar month. 5. The patient may stop CCM services at any time (effective at the end of the month) by phone call to the office staff. 6. The patient will be responsible for cost sharing (co-pay) of up to 20% of the service fee (after annual deductible is met).  [name], patients spouse verbally agreed to assistance and services provided by embedded care coordination/care management team today, DPR on file for spouse [name].  Follow up plan: Telephone appointment with care management team member scheduled for:03/04/2020.  [place], [place] [address] Direct Dial: [phone] [email or web address] Website: [email or web address]

## 2020-02-23 ENCOUNTER — Ambulatory Visit (INDEPENDENT_AMBULATORY_CARE_PROVIDER_SITE_OTHER): Payer: Medicare Other | Admitting: Family Medicine

## 2020-02-23 ENCOUNTER — Other Ambulatory Visit: Payer: Self-pay

## 2020-02-23 ENCOUNTER — Encounter: Payer: Self-pay | Admitting: Family Medicine

## 2020-02-23 ENCOUNTER — Other Ambulatory Visit: Payer: Self-pay | Admitting: Family Medicine

## 2020-02-23 VITALS — BP 153/88 | HR 62 | Temp 98.0°F | Ht 67.0 in | Wt 190.0 lb

## 2020-02-23 DIAGNOSIS — N400 Enlarged prostate without lower urinary tract symptoms: Secondary | ICD-10-CM

## 2020-02-23 DIAGNOSIS — R7303 Prediabetes: Secondary | ICD-10-CM

## 2020-02-23 DIAGNOSIS — E559 Vitamin D deficiency, unspecified: Secondary | ICD-10-CM

## 2020-02-23 DIAGNOSIS — E785 Hyperlipidemia, unspecified: Secondary | ICD-10-CM

## 2020-02-23 DIAGNOSIS — I4819 Other persistent atrial fibrillation: Secondary | ICD-10-CM

## 2020-02-23 DIAGNOSIS — Z0000 Encounter for general adult medical examination without abnormal findings: Secondary | ICD-10-CM

## 2020-02-23 DIAGNOSIS — R972 Elevated prostate specific antigen [PSA]: Secondary | ICD-10-CM | POA: Diagnosis not present

## 2020-02-23 DIAGNOSIS — I10 Essential (primary) hypertension: Secondary | ICD-10-CM | POA: Diagnosis not present

## 2020-02-23 DIAGNOSIS — K219 Gastro-esophageal reflux disease without esophagitis: Secondary | ICD-10-CM

## 2020-02-23 DIAGNOSIS — Z7901 Long term (current) use of anticoagulants: Secondary | ICD-10-CM

## 2020-02-23 LAB — COAGUCHEK XS/INR WAIVED
INR: 2.9 — ABNORMAL HIGH (ref 0.9–1.1)
Prothrombin Time: 34.3 s

## 2020-02-23 LAB — BAYER DCA HB A1C WAIVED: HB A1C (BAYER DCA - WAIVED): 6.4 % (ref <7.0)

## 2020-02-23 NOTE — Progress Notes (Signed)
BP (!) 153/88   Pulse 62   Temp 98 F (36.7 C)   Ht 5' 7"  (1.702 m)   Wt 190 lb (86.2 kg)   SpO2 98%   BMI 29.76 kg/m    Subjective:   Patient ID: [name], male    DOB: 1932-07-26, 86 y.o.   MRN: [id number]  HPI: [name] is a 86 y.o. male presenting on 02/23/2020 for Medical Management of Chronic Issues (CPE)   HPI  Patient is coming in today for physical exam and recheck of chronic medical issues. Patient denies any chest pain, shortness of breath, headaches or vision issues, abdominal complaints, diarrhea, nausea, vomiting, or joint issues.   Prediabetes Patient comes in today for recheck of his diabetes. Patient has been currently taking no medication currently has been prediabetic and we are monitoring. Patient is not currently on an ACE inhibitor/ARB. Patient has seen an ophthalmologist this year. Patient denies any issues with their feet. The symptom started onset as an adult hypertension cholesterol GERD ARE RELATED TO DM   Hypertension Patient is currently on metoprolol and diltiazem, and their blood pressure today is 153/88. Patient denies any lightheadedness or dizziness. Patient denies headaches, blurred vision, chest pains, shortness of breath, or weakness. Denies any side effects from medication and is content with current medication.   Hyperlipidemia Patient is coming in for recheck of his hyperlipidemia. The patient is currently taking lovastatin and Zetia. They deny any issues with myalgias or history of liver damage from it. They deny any focal numbness or weakness or chest pain.   GERD Patient is currently on no medication currently.  She denies any major symptoms or abdominal pain or belching or burping. She denies any blood in her stool or lightheadedness or dizziness.   Relevant past medical, surgical, family and social history reviewed and updated as indicated. Interim medical history since our last visit reviewed. Allergies and medications reviewed  and updated.  Review of Systems  Constitutional: Negative for chills and fever.  HENT: Negative for ear pain and tinnitus.   Eyes: Negative for pain and discharge.  Respiratory: Negative for cough, shortness of breath and wheezing.   Cardiovascular: Negative for chest pain, palpitations and leg swelling.  Gastrointestinal: Negative for abdominal pain, blood in stool, constipation and diarrhea.  Genitourinary: Negative for dysuria and hematuria.  Musculoskeletal: Negative for back pain, gait problem and myalgias.  Skin: Negative for rash.  Neurological: Negative for dizziness, weakness and headaches.  Psychiatric/Behavioral: Negative for suicidal ideas.  All other systems reviewed and are negative.   Per HPI unless specifically indicated above   Allergies as of 02/23/2020      Reactions   Penicillins Hives, Rash   Has patient had a PCN reaction causing immediate rash, facial/tongue/throat swelling, SOB or lightheadedness with hypotension: Yes Has patient had a PCN reaction causing severe rash involving mucus membranes or skin necrosis: Yes Has patient had a PCN reaction that required hospitalization: No Has patient had a PCN reaction occurring within the last 10 years: No If all of the above answers are "NO", then may proceed with Cephalosporin use.   Hct [hydrochlorothiazide] Other (See Comments)   hyper   Statins Other (See Comments)   Myalgia, tolerates low dosages of lovastatin       Medication List       Accurate as of Feb 23, 2020 10:15 AM. If you have any questions, ask your nurse or doctor.  acetaminophen 500 MG tablet Commonly known as: TYLENOL Take 1,000 mg by mouth every 6 (six) hours as needed for moderate pain.   diltiazem 120 MG 24 hr capsule Commonly known as: CARDIZEM CD TAKE ONE (1) CAPSULE EACH DAY What changed:   how much to take  how to take this  when to take this  additional instructions   ezetimibe 10 MG tablet Commonly known as:  ZETIA TAKE ONE (1) TABLET EACH DAY What changed:   how much to take  how to take this  when to take this  additional instructions   finasteride 5 MG tablet Commonly known as: PROSCAR TAKE ONE (1) TABLET EACH DAY What changed:   how much to take  how to take this  when to take this  additional instructions   fluticasone 50 MCG/ACT nasal spray Commonly known as: FLONASE Place 1 spray into both nostrils daily as needed for allergies or rhinitis.   furosemide 20 MG tablet Commonly known as: LASIX TAKE ONE (1) TABLET EACH DAY What changed:   how much to take  how to take this  when to take this  additional instructions   lovastatin 20 MG tablet Commonly known as: MEVACOR Take 1 tablet (20 mg total) by mouth at bedtime. What changed:   how much to take  when to take this   metoprolol 200 MG 24 hr tablet Commonly known as: TOPROL-XL TAKE ONE (1) TABLET EACH DAY What changed:   how much to take  how to take this  when to take this  additional instructions   SYSTANE OP Place 1 drop into both eyes 3 (three) times daily as needed (dry eyes).   Vitamin D3 125 MCG (5000 UT) Caps Take 5,000 Units by mouth once a week.   warfarin 5 MG tablet Commonly known as: COUMADIN Take as directed by the anticoagulation clinic. If you are unsure how to take this medication, talk to your nurse or doctor. Original instructions: TAKE 1 TAB DAILY ON SAT, SUN, TUE, &THURTAKE 1/2 TAB DAILY ON MON, WED, FRI What changed:   how much to take  how to take this  when to take this  additional instructions        Objective:   BP (!) 153/88   Pulse 62   Temp 98 F (36.7 C)   Ht '5\' 7"'$  (1.702 m)   Wt 190 lb (86.2 kg)   SpO2 98%   BMI 29.76 kg/m   Wt Readings from Last 3 Encounters:  02/23/20 190 lb (86.2 kg)  12/12/19 193 lb (87.5 kg)  10/17/19 193 lb 12.8 oz (87.9 kg)    Physical Exam Vitals and nursing note reviewed.  Constitutional:      General: He  is not in acute distress.    Appearance: He is well-developed. He is not diaphoretic.  Eyes:     General: No scleral icterus.       Right eye: No discharge.     Conjunctiva/sclera: Conjunctivae normal.     Pupils: Pupils are equal, round, and reactive to light.  Neck:     Thyroid: No thyromegaly.  Cardiovascular:     Rate and Rhythm: Normal rate and regular rhythm.     Heart sounds: Normal heart sounds. No murmur.  Pulmonary:     Effort: Pulmonary effort is normal. No respiratory distress.     Breath sounds: Normal breath sounds. No wheezing.  Musculoskeletal:        General: Normal range of motion.  Cervical back: Neck supple.  Lymphadenopathy:     Cervical: No cervical adenopathy.  Skin:    General: Skin is warm and dry.     Findings: No rash.  Neurological:     Mental Status: He is alert and oriented to person, place, and time.     Coordination: Coordination normal.  Psychiatric:        Behavior: Behavior normal.     Description   Continue taking 1 tablet every day except for Mondays take 1/2 tablet  INR today is  2.9  (goal is 2-3)    Recheck in 6 weeks      Assessment & Plan:   Problem List Items Addressed This Visit      Cardiovascular and Mediastinum   Essential hypertension   Relevant Orders   CBC with Differential/Platelet   CMP14+EGFR   Atrial fibrillation, persistent (HCC)     Digestive   GERD     Genitourinary   [name] with elevated PSA     Other   Long term current use of anticoagulant therapy   Relevant Orders   CoaguChek XS/INR Waived   Vitamin D deficiency   Relevant Orders   VITAMIN D 25 Hydroxy (Vit-D Deficiency, Fractures)   Dyslipidemia   Relevant Orders   CBC with Differential/Platelet   Lipase   Prediabetes   Relevant Orders   Bayer DCA Hb A1c Waived    Other Visit Diagnoses    Well adult exam    -  Primary   Elevated PSA       Relevant Orders   PSA, total and free      Continue current medication, no  change. Follow up plan: Return in about 6 months (around 08/25/2020), or if symptoms worsen or fail to improve.  Counseling provided for all of the vaccine components Orders Placed This Encounter  Procedures  . CoaguChek XS/INR Waived  . CBC with Differential/Platelet  . CMP14+EGFR  . Lipase  . Bayer DCA Hb A1c Waived  . PSA, total and free  . VITAMIN D 25 Hydroxy (Vit-D Deficiency, Fractures)    Caryl Pina, MD [facility] 02/23/2020, 10:15 AM

## 2020-02-24 LAB — CBC WITH DIFFERENTIAL/PLATELET
Basophils Absolute: 0.1 10*3/uL (ref 0.0–0.2)
Basos: 0 %
EOS (ABSOLUTE): 0.4 10*3/uL (ref 0.0–0.4)
Eos: 3 %
Hematocrit: 41.8 % (ref 37.5–51.0)
Hemoglobin: 14.1 g/dL (ref 13.0–17.7)
Immature Grans (Abs): 0 10*3/uL (ref 0.0–0.1)
Immature Granulocytes: 0 %
Lymphocytes Absolute: 5.1 10*3/uL — ABNORMAL HIGH (ref 0.7–3.1)
Lymphs: 37 %
MCH: 32.7 pg (ref 26.6–33.0)
MCHC: 33.7 g/dL (ref 31.5–35.7)
MCV: 97 fL (ref 79–97)
Monocytes Absolute: 0.7 10*3/uL (ref 0.1–0.9)
Monocytes: 5 %
Neutrophils Absolute: 7.4 10*3/uL — ABNORMAL HIGH (ref 1.4–7.0)
Neutrophils: 55 %
Platelets: 199 10*3/uL (ref 150–450)
RBC: 4.31 x10E6/uL (ref 4.14–5.80)
RDW: 11.8 % (ref 11.6–15.4)
WBC: 13.7 10*3/uL — ABNORMAL HIGH (ref 3.4–10.8)

## 2020-02-24 LAB — CMP14+EGFR
ALT: 12 IU/L (ref 0–44)
AST: 18 IU/L (ref 0–40)
Albumin/Globulin Ratio: 1.6 (ref 1.2–2.2)
Albumin: 4.2 g/dL (ref 3.6–4.6)
Alkaline Phosphatase: 58 IU/L (ref 39–117)
BUN/Creatinine Ratio: 16 (ref 10–24)
BUN: 18 mg/dL (ref 8–27)
Bilirubin Total: 0.5 mg/dL (ref 0.0–1.2)
CO2: 25 mmol/L (ref 20–29)
Calcium: 9.3 mg/dL (ref 8.6–10.2)
Chloride: 104 mmol/L (ref 96–106)
Creatinine, Ser: 1.14 mg/dL (ref 0.76–1.27)
GFR calc Af Amer: 67 mL/min/{1.73_m2} (ref >59)
GFR calc non Af Amer: 58 mL/min/{1.73_m2} — ABNORMAL LOW (ref >59)
Globulin, Total: 2.6 g/dL (ref 1.5–4.5)
Glucose: 99 mg/dL (ref 65–99)
Potassium: 4.1 mmol/L (ref 3.5–5.2)
Sodium: 141 mmol/L (ref 134–144)
Total Protein: 6.8 g/dL (ref 6.0–8.5)

## 2020-02-24 LAB — PSA, TOTAL AND FREE
PSA, Free Pct: 13.1 %
PSA, Free: 1.15 ng/mL
Prostate Specific Ag, Serum: 8.8 ng/mL — ABNORMAL HIGH (ref 0.0–4.0)

## 2020-02-24 LAB — LIPASE: Lipase: 78 U/L (ref 13–78)

## 2020-02-24 LAB — VITAMIN D 25 HYDROXY (VIT D DEFICIENCY, FRACTURES): Vit D, 25-Hydroxy: 34.1 ng/mL (ref 30.0–100.0)

## 2020-03-04 ENCOUNTER — Ambulatory Visit (INDEPENDENT_AMBULATORY_CARE_PROVIDER_SITE_OTHER): Payer: Medicare Other | Admitting: Licensed Clinical Social Worker

## 2020-03-04 DIAGNOSIS — N400 Enlarged prostate without lower urinary tract symptoms: Secondary | ICD-10-CM

## 2020-03-04 DIAGNOSIS — K219 Gastro-esophageal reflux disease without esophagitis: Secondary | ICD-10-CM

## 2020-03-04 DIAGNOSIS — I4819 Other persistent atrial fibrillation: Secondary | ICD-10-CM | POA: Diagnosis not present

## 2020-03-04 DIAGNOSIS — R972 Elevated prostate specific antigen [PSA]: Secondary | ICD-10-CM | POA: Diagnosis not present

## 2020-03-04 DIAGNOSIS — I10 Essential (primary) hypertension: Secondary | ICD-10-CM | POA: Diagnosis not present

## 2020-03-04 DIAGNOSIS — M8949 Other hypertrophic osteoarthropathy, multiple sites: Secondary | ICD-10-CM

## 2020-03-04 DIAGNOSIS — M159 Polyosteoarthritis, unspecified: Secondary | ICD-10-CM

## 2020-03-04 DIAGNOSIS — I2510 Atherosclerotic heart disease of native coronary artery without angina pectoris: Secondary | ICD-10-CM

## 2020-03-04 NOTE — Chronic Care Management (AMB) (Signed)
Chronic Care Management    Clinical Social Work Follow Up Note  03/04/2020 Name: [name] MRN: [id number] DOB: 09-Feb-1932  [name] is a 86 y.o. year old male who is a primary care patient of [name], Greg Kaufmann, MD. The CCM team was consulted for assistance with Community resources  Review of patient status, including review of consultants reports, other relevant assessments, and collaboration with appropriate care team members and the patient's provider was performed as part of comprehensive patient evaluation and provision of chronic care management services.    SDOH (Social Determinants of Health) assessments performed: Yes;risk for depression;risk for tobacco use  SDOH Interventions     Most Recent Value  SDOH Interventions  Depression Interventions/Treatment   -- [talked with client about LCSW support and [name] support]        Chronic Care Management from 03/04/2020 in [place]  PHQ-9 Total Score  4     GAD 7 : Generalized Anxiety Score 03/04/2020  Nervous, Anxious, on Edge 1  Control/stop worrying 0  Worry too much - different things 0  Trouble relaxing 0  Restless 0  Easily annoyed or irritable 0  Afraid - awful might happen 0  Total GAD 7 Score 1  Anxiety Difficulty Somewhat difficult    Outpatient Encounter Medications as of 03/04/2020  Medication Sig  . acetaminophen (TYLENOL) 500 MG tablet Take 1,000 mg by mouth every 6 (six) hours as needed for moderate pain.  . Cholecalciferol (VITAMIN D3) 5000 units CAPS Take 5,000 Units by mouth once a week.   . diltiazem (CARDIZEM CD) 120 MG 24 hr capsule TAKE ONE (1) CAPSULE EACH DAY (Patient taking differently: Take 120 mg by mouth daily. )  . ezetimibe (ZETIA) 10 MG tablet TAKE ONE (1) TABLET EACH DAY (Patient taking differently: Take 10 mg by mouth daily. )  . finasteride (PROSCAR) 5 MG tablet TAKE ONE (1) TABLET EACH DAY (Patient taking differently: Take 5 mg by mouth daily. )  .  fluticasone (FLONASE) 50 MCG/ACT nasal spray Place 1 spray into both nostrils daily as needed for allergies or rhinitis.  . furosemide (LASIX) 20 MG tablet TAKE ONE (1) TABLET EACH DAY (Patient taking differently: Take 20 mg by mouth daily. )  . lovastatin (MEVACOR) 20 MG tablet Take 1 tablet (20 mg total) by mouth at bedtime. (Patient taking differently: Take 10 mg by mouth every other day. )  . metoprolol (TOPROL-XL) 200 MG 24 hr tablet TAKE ONE (1) TABLET EACH DAY (Patient taking differently: Take 200 mg by mouth daily. )  . Polyethyl Glycol-Propyl Glycol (SYSTANE OP) Place 1 drop into both eyes 3 (three) times daily as needed (dry eyes).   . warfarin (COUMADIN) 5 MG tablet TAKE 1 TABLET ON SAT, SUN, TUES, & THURSTAKE 1/2 TABLET ON MON, WED, & FRIDAY   No facility-administered encounter medications on file as of 03/04/2020.    Goals Addressed            This Visit's Progress   . Client will talk with LCSW in next 30 days to discuss [name] and completion of daily activities (pt-stated)       CARE PLAN ENTRY  Current Barriers:  . Pain issues in client with Chronic Diagnoses of CHF, [name], [name], HTN, Atrial Fibrillation, GERD, OA  Clinical Social Work Clinical Goal(s):  Marland Kitchen LCSW will call client in next 30 days to talk with [name] and about his completion  of daily activities  Interventions: . Talked with client about CCM program support . Talked with client about ambulation challenges . Talked with client about vision challenges of client . Talked with client about transport needs of client . Talked with client about relaxation techniques of client (likes to work outdoors, care for their yard, visit with family and friends) . Talked with client about upcoming client medical appointments . Talked with client about past history of heart issues . Talked with client about his energy level and occasional decreased energy on exertion . Talked  with client about occasional shortness of breath . Talked with client about his use of grab bars in the home  Patient Self Care Activities:  Completes ADLs independently Drives to appointments and to complete errands  Patient Self Care Deficits:  . Pain issues in hips  Initial goal documentation       Follow Up Plan: LCSW to call client in next 4 weeks to talk with [name] and to talk with him about completion of daily activities  [name].[name] MSW, LCSW Licensed Clinical Social Worker [facility] [phone]

## 2020-03-04 NOTE — Patient Instructions (Signed)
Licensed Clinical Social Worker Visit Information  Goals we discussed today:  Goals Addressed            This Visit's Progress   . Client will talk with LCSW in next 30 days to discuss [name] and completion of daily activities (pt-stated)       CARE PLAN ENTRY  Current Barriers:  . Pain issues in client with Chronic Diagnoses of CHF, [name], [name], HTN, Atrial Fibrillation, GERD, OA  Clinical Social Work Clinical Goal(s):  Marland Kitchen LCSW will call client in next 30 days to talk with [name] and about his completion of daily activities  Interventions: . Talked with client about CCM program support . Talked with client about ambulation challenges . Talked with client about vision challenges of client . Talked with client about transport needs of client . Talked with client about relaxation techniques of client (likes to work outdoors, care for their yard, visit with family and friends) . Talked with client about upcoming client medical appointments . Talked with client about past history of heart issues . Talked with client about his energy level and occasional decreased energy on exertion . Talked with client about occasional shortness of breath . Talked with client about his use of grab bars in the home  Patient Self Care Activities:  Completes ADLs independently Drives to appointments and to complete errands  Patient Self Care Deficits:  . Pain issues in hips  Initial goal documentation       Materials Provided: No  Follow Up Plan:  LCSW to call client in next 4 weeks to talk with [name] and to talk with client about his completion of daily activities  The patient verbalized understanding of instructions provided today and declined a print copy of patient instruction materials.   Norva Riffle.Luvia Orzechowski MSW, LCSW Licensed Clinical Social Worker [facility]  [facility] [phone]

## 2020-04-08 ENCOUNTER — Ambulatory Visit (INDEPENDENT_AMBULATORY_CARE_PROVIDER_SITE_OTHER): Payer: Medicare Other | Admitting: Licensed Clinical Social Worker

## 2020-04-08 DIAGNOSIS — I4819 Other persistent atrial fibrillation: Secondary | ICD-10-CM

## 2020-04-08 DIAGNOSIS — I2510 Atherosclerotic heart disease of native coronary artery without angina pectoris: Secondary | ICD-10-CM | POA: Diagnosis not present

## 2020-04-08 DIAGNOSIS — I10 Essential (primary) hypertension: Secondary | ICD-10-CM | POA: Diagnosis not present

## 2020-04-08 DIAGNOSIS — R972 Elevated prostate specific antigen [PSA]: Secondary | ICD-10-CM | POA: Diagnosis not present

## 2020-04-08 DIAGNOSIS — M8949 Other hypertrophic osteoarthropathy, multiple sites: Secondary | ICD-10-CM

## 2020-04-08 DIAGNOSIS — N400 Enlarged prostate without lower urinary tract symptoms: Secondary | ICD-10-CM

## 2020-04-08 DIAGNOSIS — K219 Gastro-esophageal reflux disease without esophagitis: Secondary | ICD-10-CM

## 2020-04-08 DIAGNOSIS — M159 Polyosteoarthritis, unspecified: Secondary | ICD-10-CM

## 2020-04-08 NOTE — Patient Instructions (Addendum)
Licensed Clinical Social Worker Visit Information  Goals we discussed today:  Goals Addressed              This Visit's Progress   .  Client will talk with LCSW in next 30 days to discuss [name] and completion of daily activities (pt-stated)        CARE PLAN ENTRY  Current Barriers:  . Pain issues in [name], Atrial Fibrillation, GERD, OA, [name], [name], CHF  Clinical Social Work Clinical Goal(s):  Marland Kitchen LCSW will call client in next 30 days to talk with [name] and about his completion of daily activities  Interventions: . Talked with client about CCM program support . Talked with client about ambulation challenges . Talked with client about vision challenges of client . Talked with client about transport needs of client . Talked with client about relaxation techniques of client (likes to work outdoors, care for their yard, visit with family and friends) . Talked with client about upcoming client medical appointments . Talked with client about his energy level and occasional decreased energy on exertion . Talked with client about occasional shortness of breath . Talked with client about his use of grab bars in the home  Talked with client about social support (spouse is supportive, daughter is supportive)  Talked with client about pain issue faced  Encouraged client to call [name] as needed to discuss nursing needs of client  Patient Self Care Activities:  Completes ADLs independently Drives to appointments and to complete errands  Patient Self Care Deficits:  . Pain issues in hips  Initial goal documentation       Materials Provided: No  Follow Up Plan: LCSW to call client in next 4 weeks to talk with [name] and to talk with him about completion of daily activities  The patient verbalized understanding of instructions provided today and declined a print copy of  patient instruction materials.   Norva Riffle.Shemiah Rosch MSW, LCSW Licensed Clinical Social Worker [facility] [phone]

## 2020-04-08 NOTE — Chronic Care Management (AMB) (Signed)
Chronic Care Management    Clinical Social Work Follow Up Note  04/08/2020 Name: [name] MRN: [id number] DOB: 1931-12-05  [name] is a 86 y.o. year old male who is a primary care patient of [name], Greg Kaufmann, MD. The CCM team was consulted for assistance with [name] .   Review of patient status, including review of consultants reports, other relevant assessments, and collaboration with appropriate care team members and the patient's provider was performed as part of comprehensive patient evaluation and provision of chronic care management services.    SDOH (Social Determinants of Health) assessments performed: No;risk for tobacco use; risk for social isolation; risk for depression ;risk for stress    Chronic Care Management from 03/04/2020 in [place]  PHQ-9 Total Score 4       GAD 7 : Generalized Anxiety Score 03/04/2020  Nervous, Anxious, on Edge 1  Control/stop worrying 0  Worry too much - different things 0  Trouble relaxing 0  Restless 0  Easily annoyed or irritable 0  Afraid - awful might happen 0  Total GAD 7 Score 1  Anxiety Difficulty Somewhat difficult    Outpatient Encounter Medications as of 04/08/2020  Medication Sig  . acetaminophen (TYLENOL) 500 MG tablet Take 1,000 mg by mouth every 6 (six) hours as needed for moderate pain.  . Cholecalciferol (VITAMIN D3) 5000 units CAPS Take 5,000 Units by mouth once a week.   . diltiazem (CARDIZEM CD) 120 MG 24 hr capsule TAKE ONE (1) CAPSULE EACH DAY (Patient taking differently: Take 120 mg by mouth daily. )  . ezetimibe (ZETIA) 10 MG tablet TAKE ONE (1) TABLET EACH DAY (Patient taking differently: Take 10 mg by mouth daily. )  . finasteride (PROSCAR) 5 MG tablet TAKE ONE (1) TABLET EACH DAY (Patient taking differently: Take 5 mg by mouth daily. )  . fluticasone (FLONASE) 50 MCG/ACT nasal spray Place 1 spray into both nostrils daily as needed for allergies or rhinitis.  .  furosemide (LASIX) 20 MG tablet TAKE ONE (1) TABLET EACH DAY (Patient taking differently: Take 20 mg by mouth daily. )  . lovastatin (MEVACOR) 20 MG tablet Take 1 tablet (20 mg total) by mouth at bedtime. (Patient taking differently: Take 10 mg by mouth every other day. )  . metoprolol (TOPROL-XL) 200 MG 24 hr tablet TAKE ONE (1) TABLET EACH DAY (Patient taking differently: Take 200 mg by mouth daily. )  . Polyethyl Glycol-Propyl Glycol (SYSTANE OP) Place 1 drop into both eyes 3 (three) times daily as needed (dry eyes).   . warfarin (COUMADIN) 5 MG tablet TAKE 1 TABLET ON SAT, SUN, TUES, & THURSTAKE 1/2 TABLET ON MON, WED, & FRIDAY   No facility-administered encounter medications on file as of 04/08/2020.     Goals Addressed              This Visit's Progress   .  Client will talk with LCSW in next 30 days to discuss [name] and completion of daily activities (pt-stated)        CARE PLAN ENTRY  Current Barriers:  . Pain issues in [name], Atrial Fibrillation, GERD, OA, [name], [name], CHF  Clinical Social Work Clinical Goal(s):  Marland Kitchen LCSW will call client in next 30 days to talk with [name] and about his completion of daily activities  Interventions: . Talked with client about CCM program support . Talked with  client about ambulation challenges . Talked with client about vision challenges of client . Talked with client about transport needs of client . Talked with client about relaxation techniques of client (likes to work outdoors, care for their yard, visit with family and friends) . Talked with client about upcoming client medical appointments . Talked with client about his energy level and occasional decreased energy on exertion . Talked with client about occasional shortness of breath . Talked with client about his use of grab bars in the home . Talked with client about social support (spouse is  supportive, daughter is supportive) . Talked with client about pain issue faced . Encouraged client to call [name] as needed to discuss nursing needs of client  Patient Self Care Activities:  Completes ADLs independently Drives to appointments and to complete errands  Patient Self Care Deficits:  . Pain issues in hips  Initial goal documentation       Follow Up Plan: LCSW to call client in next 4 weeks to talk with [name] and to talk with him about completion of daily activities  Norva Riffle.Dazhane Villagomez MSW, LCSW Licensed Clinical Social Worker [facility] [phone]

## 2020-04-12 ENCOUNTER — Telehealth: Payer: Self-pay

## 2020-04-12 ENCOUNTER — Ambulatory Visit (INDEPENDENT_AMBULATORY_CARE_PROVIDER_SITE_OTHER): Payer: Medicare Other | Admitting: Family Medicine

## 2020-04-12 ENCOUNTER — Other Ambulatory Visit: Payer: Self-pay

## 2020-04-12 ENCOUNTER — Encounter: Payer: Self-pay | Admitting: Family Medicine

## 2020-04-12 VITALS — BP 118/72 | HR 97 | Temp 98.0°F | Ht 67.0 in | Wt 181.0 lb

## 2020-04-12 DIAGNOSIS — I4819 Other persistent atrial fibrillation: Secondary | ICD-10-CM | POA: Diagnosis not present

## 2020-04-12 DIAGNOSIS — Z7901 Long term (current) use of anticoagulants: Secondary | ICD-10-CM | POA: Diagnosis not present

## 2020-04-12 LAB — COAGUCHEK XS/INR WAIVED
INR: 3.5 — ABNORMAL HIGH (ref 0.9–1.1)
Prothrombin Time: 41.6 s

## 2020-04-12 NOTE — Progress Notes (Signed)
BP 118/72   Pulse 97   Temp 98 F (36.7 C)   Ht 5\' 7"  (1.702 m)   Wt 181 lb (82.1 kg)   SpO2 99%   BMI 28.35 kg/m    Subjective:   Patient ID: [name], male    DOB: Feb 15, 1932, 86 y.o.   MRN: [id number]  HPI: [name] is a 86 y.o. male presenting on 04/12/2020 for Medical Management of Chronic Issues and Atrial Fibrillation   HPI Coumadin recheck Target goal: 2.0-3.0 Reason on anticoagulation: Persistent A. fib Patient denies any bruising or bleeding or chest pain or palpitations   Relevant past medical, surgical, family and social history reviewed and updated as indicated. Interim medical history since our last visit reviewed. Allergies and medications reviewed and updated.  Review of Systems  Constitutional: Negative for chills and fever.  Respiratory: Negative for shortness of breath and wheezing.   Cardiovascular: Negative for chest pain and leg swelling.  Gastrointestinal: Negative for blood in stool.  Genitourinary: Negative for hematuria.  Neurological: Negative for dizziness and light-headedness.  All other systems reviewed and are negative.   Per HPI unless specifically indicated above      Objective:   BP 118/72   Pulse 97   Temp 98 F (36.7 C)   Ht 5\' 7"  (1.702 m)   Wt 181 lb (82.1 kg)   SpO2 99%   BMI 28.35 kg/m   Wt Readings from Last 3 Encounters:  04/12/20 181 lb (82.1 kg)  02/23/20 190 lb (86.2 kg)  12/12/19 193 lb (87.5 kg)    Physical Exam Vitals and nursing note reviewed.  Constitutional:      General: He is not in acute distress.    Appearance: He is well-developed. He is not diaphoretic.  Eyes:     General: No scleral icterus.    Conjunctiva/sclera: Conjunctivae normal.  Neck:     Thyroid: No thyromegaly.  Cardiovascular:     Rate and Rhythm: Normal rate. Rhythm irregular.     Heart sounds: Normal heart sounds. No murmur heard.   Pulmonary:     Effort: Pulmonary effort is normal. No respiratory distress.      Breath sounds: Normal breath sounds. No wheezing.  Musculoskeletal:        General: Normal range of motion.  Skin:    General: Skin is warm and dry.     Findings: No rash.  Neurological:     Mental Status: He is alert and oriented to person, place, and time.     Coordination: Coordination normal.  Psychiatric:        Behavior: Behavior normal.      Assessment & Plan:   Problem List Items Addressed This Visit      Cardiovascular and Mediastinum   Atrial fibrillation, persistent ([place]) - Primary   Relevant Orders   CoaguChek XS/INR Waived     Other   Long term current use of anticoagulant therapy      Description   Hold today's dose and then reduce weekly dose by taking 1 tablet every day except for Mondays and Thursdays take 1/2 tablet  INR today is  3.5  (goal is 2-3)    Recheck in 4-6 weeks     Follow up plan: Return if symptoms worsen or fail to improve, for 4 to 6-week INR.  Counseling provided for all of the vaccine components Orders Placed This Encounter  Procedures  . CoaguChek XS/INR Waived    Caryl Pina, MD  [place] 04/12/2020, 9:15 AM

## 2020-04-12 NOTE — Telephone Encounter (Signed)
Celeste from [facility] is calling to report that patient is taking Lovastatin differently from prescribed.  Lovastatin is prescribed as 20 mg nightly.  Patient is taking as 1/2 tablet every other day..  This is noted in the patient's chart.  [facility] is asking that we send in a refill to patient's pharmacy, [facility], that is updated with the way the patient is actually taking the medication.

## 2020-04-15 NOTE — Telephone Encounter (Signed)
Okay to go ahead and send a new prescription for the patient stating how they take it, half a tablet of the 20 mg tablets nightly and give 45 with 3 refills

## 2020-04-16 ENCOUNTER — Telehealth: Payer: Self-pay | Admitting: Family Medicine

## 2020-04-16 MED ORDER — LOVASTATIN 20 MG PO TABS: 10.0000 mg | ORAL_TABLET | ORAL | 3 refills | Status: DC

## 2020-04-16 NOTE — Addendum Note (Signed)
Addended by: Milas Hock on: 04/16/2020 08:23 AM   Modules accepted: Orders

## 2020-05-13 ENCOUNTER — Ambulatory Visit (INDEPENDENT_AMBULATORY_CARE_PROVIDER_SITE_OTHER): Payer: Medicare Other | Admitting: Licensed Clinical Social Worker

## 2020-05-13 DIAGNOSIS — N400 Enlarged prostate without lower urinary tract symptoms: Secondary | ICD-10-CM

## 2020-05-13 DIAGNOSIS — R972 Elevated prostate specific antigen [PSA]: Secondary | ICD-10-CM | POA: Diagnosis not present

## 2020-05-13 DIAGNOSIS — I2510 Atherosclerotic heart disease of native coronary artery without angina pectoris: Secondary | ICD-10-CM | POA: Diagnosis not present

## 2020-05-13 DIAGNOSIS — I10 Essential (primary) hypertension: Secondary | ICD-10-CM | POA: Diagnosis not present

## 2020-05-13 DIAGNOSIS — I4819 Other persistent atrial fibrillation: Secondary | ICD-10-CM

## 2020-05-13 DIAGNOSIS — M159 Polyosteoarthritis, unspecified: Secondary | ICD-10-CM

## 2020-05-13 DIAGNOSIS — M8949 Other hypertrophic osteoarthropathy, multiple sites: Secondary | ICD-10-CM

## 2020-05-13 DIAGNOSIS — K219 Gastro-esophageal reflux disease without esophagitis: Secondary | ICD-10-CM

## 2020-05-13 NOTE — Patient Instructions (Addendum)
Licensed Clinical Social Worker Visit Information  Goals we discussed today:  .  Client will talk with LCSW in next 30 days to discuss [name] and completion of daily activities (pt-stated)        CARE PLAN ENTRY  Current Barriers:   Pain issues in [name], Atrial Fibrillation, GERD, OA, [name], [name], CHF  Clinical Social Work Clinical Goal(s):   LCSW will call client in next 30 days to talk with [name] and about his completion of daily activities  Interventions:  Talked with [name], spouse of client, about CCM program support  Talked with [name] about ambulation challenges of client  Talked with [name] about vision challenges of client  Talked with [name] about transport needs of client  Talked with [name] about upcoming client medical appointments  Talked with client previously about past history of heart issues of client  Talked with [name] about client's energy level and occasional decreased energy on exertion  Talked with client previously about his use of grab bars in the home  Talked with [name] about appetite of client   Encouraged Greg/client to call [name] as needed for nursing support for client  Patient Self Care Activities:  Completes ADLs independently Drives to appointments and to complete errands  Patient Self Care Deficits:   Pain issues in hips  Initial goal documentation    Follow Up Plan: LCSW to call client/spouse in next 4 weeks to talk with client /spouse about client completion of ADLs and to talk about client completion of daily activities  Materials Provided: No  The patient/[name], spouse, verbalized understanding of instructions provided today and declined a print copy of patient instruction materials.   Norva Riffle.Venetia Prewitt MSW, LCSW Licensed Clinical Social Worker [facility]  [facility] [phone]

## 2020-05-13 NOTE — Chronic Care Management (AMB) (Signed)
Chronic Care Management    Clinical Social Work Follow Up Note  05/13/2020 Name: [name] MRN: [id number] DOB: 1932/06/10  [name] is a 86 y.o. year old male who is a primary care patient of [name], Fransisca Kaufmann, MD. The CCM team was consulted for assistance with [name] .   Review of patient status, including review of consultants reports, other relevant assessments, and collaboration with appropriate care team members and the patient's provider was performed as part of comprehensive patient evaluation and provision of chronic care management services.    SDOH (Social Determinants of Health) assessments performed: No;risk for tobacco use; risk for depression; risk for stress;risk for physical inactivity    Chronic Care Management from 03/04/2020 in [facility]  PHQ-9 Total Score 4       GAD 7 : Generalized Anxiety Score 03/04/2020  Nervous, Anxious, on Edge 1  Control/stop worrying 0  Worry too much - different things 0  Trouble relaxing 0  Restless 0  Easily annoyed or irritable 0  Afraid - awful might happen 0  Total GAD 7 Score 1  Anxiety Difficulty Somewhat difficult    Outpatient Encounter Medications as of 05/13/2020  Medication Sig  . acetaminophen (TYLENOL) 500 MG tablet Take 1,000 mg by mouth every 6 (six) hours as needed for moderate pain.  . Cholecalciferol (VITAMIN D3) 5000 units CAPS Take 5,000 Units by mouth once a week.   . diltiazem (CARDIZEM CD) 120 MG 24 hr capsule TAKE ONE (1) CAPSULE EACH DAY (Patient taking differently: Take 120 mg by mouth daily. )  . ezetimibe (ZETIA) 10 MG tablet TAKE ONE (1) TABLET EACH DAY (Patient taking differently: Take 10 mg by mouth daily. )  . finasteride (PROSCAR) 5 MG tablet TAKE ONE (1) TABLET EACH DAY (Patient taking differently: Take 5 mg by mouth daily. )  . fluticasone (FLONASE) 50 MCG/ACT nasal spray Place 1 spray into both nostrils daily as needed for allergies or rhinitis.  .  furosemide (LASIX) 20 MG tablet TAKE ONE (1) TABLET EACH DAY (Patient taking differently: Take 20 mg by mouth daily. )  . lovastatin (MEVACOR) 20 MG tablet Take 0.5 tablets (10 mg total) by mouth every other day.  . metoprolol (TOPROL-XL) 200 MG 24 hr tablet TAKE ONE (1) TABLET EACH DAY (Patient taking differently: Take 200 mg by mouth daily. )  . Polyethyl Glycol-Propyl Glycol (SYSTANE OP) Place 1 drop into both eyes 3 (three) times daily as needed (dry eyes).   . warfarin (COUMADIN) 5 MG tablet TAKE 1 TABLET ON SAT, SUN, TUES, & THURSTAKE 1/2 TABLET ON MON, WED, & FRIDAY   No facility-administered encounter medications on file as of 05/13/2020.    Goals    .  Client will talk with LCSW in next 30 days to discuss [name] and completion of daily activities (pt-stated)      CARE PLAN ENTRY  Current Barriers:  . Pain issues in [name], Atrial Fibrillation, GERD, OA, [name], [name], CHF  Clinical Social Work Clinical Goal(s):  Marland Kitchen LCSW will call client in next 30 days to talk with [name] and about his completion of daily activities  Interventions: . Talked with [name], spouse of client, about CCM program support . Talked with [name] about ambulation challenges of client . Talked with [name] about vision challenges of client . Talked with [name] about transport needs of client . Talked with  [name] about upcoming client medical appointments . Talked with client previously about past history of heart issues of client . Talked with [name] about client's energy level and occasional decreased energy on exertion . Talked with client previously about his use of grab bars in the home . Talked with [name] about appetite of client  . Encouraged Shirley/client to call [name] as needed for nursing support for client  Patient Self Care Activities:  Completes ADLs independently Drives to appointments and to complete  errands  Patient Self Care Deficits:  . Pain issues in hips  Initial goal documentation    Follow Up Plan: LCSW to call client/spouse in next 4 weeks to talk with client /spouse about client completion of ADLs and to talk about client completion of daily activities  Norva Riffle.Zenith Kercheval MSW, LCSW Licensed Clinical Social Worker [facility] [phone]

## 2020-06-07 ENCOUNTER — Ambulatory Visit (INDEPENDENT_AMBULATORY_CARE_PROVIDER_SITE_OTHER): Payer: Medicare Other | Admitting: Family Medicine

## 2020-06-07 ENCOUNTER — Encounter: Payer: Self-pay | Admitting: Family Medicine

## 2020-06-07 ENCOUNTER — Other Ambulatory Visit: Payer: Self-pay

## 2020-06-07 VITALS — BP 150/78 | HR 73 | Temp 97.6°F | Ht 67.0 in | Wt 182.0 lb

## 2020-06-07 DIAGNOSIS — I4819 Other persistent atrial fibrillation: Secondary | ICD-10-CM

## 2020-06-07 DIAGNOSIS — Z7901 Long term (current) use of anticoagulants: Secondary | ICD-10-CM

## 2020-06-07 LAB — COAGUCHEK XS/INR WAIVED
INR: 2.5 — ABNORMAL HIGH (ref 0.9–1.1)
Prothrombin Time: 29.5 s

## 2020-06-07 NOTE — Progress Notes (Signed)
   BP (!) 150/78   Pulse 73   Temp 97.6 F (36.4 C)   Ht 5\' 7"  (1.702 m)   Wt 182 lb (82.6 kg)   SpO2 96%   BMI 28.51 kg/m    Subjective:   Patient ID: [name], male    DOB: Jul 09, 1932, 86 y.o.   MRN: [id number]  HPI: [name] is a 86 y.o. male presenting on 06/07/2020 for Medical Management of Chronic Issues and Atrial Fibrillation   HPI Coumadin recheck Target goal: 2.0-3.0 Reason on anticoagulation: Persistent A. fib Patient denies any bruising or bleeding or chest pain or palpitations   Relevant past medical, surgical, family and social history reviewed and updated as indicated. Interim medical history since our last visit reviewed. Allergies and medications reviewed and updated.  Review of Systems  Constitutional: Negative for chills and fever.  Respiratory: Negative for shortness of breath and wheezing.   Cardiovascular: Negative for chest pain and leg swelling.  Gastrointestinal: Negative for blood in stool.  Genitourinary: Negative for hematuria.  Skin: Negative for rash.  All other systems reviewed and are negative.   Per HPI unless specifically indicated above      Objective:   BP (!) 150/78   Pulse 73   Temp 97.6 F (36.4 C)   Ht 5\' 7"  (1.702 m)   Wt 182 lb (82.6 kg)   SpO2 96%   BMI 28.51 kg/m   Wt Readings from Last 3 Encounters:  06/07/20 182 lb (82.6 kg)  04/12/20 181 lb (82.1 kg)  02/23/20 190 lb (86.2 kg)    Physical Exam Vitals and nursing note reviewed.  Constitutional:      Appearance: He is normal weight.  Eyes:     Conjunctiva/sclera: Conjunctivae normal.  Skin:    General: Skin is warm.     Findings: No bruising.  Neurological:     Mental Status: He is alert.       Assessment & Plan:   Problem List Items Addressed This Visit      Cardiovascular and Mediastinum   Atrial fibrillation, persistent ([place]) - Primary   Relevant Orders   CoaguChek XS/INR Waived     Other   Long term current use of anticoagulant  therapy      Description   Continue weekly dose by taking 1 tablet every day except for Mondays and Thursdays take 1/2 tablet  INR today is  2.5 (goal is 2-3)    Recheck in 4-6 weeks     Follow up plan: Return if symptoms worsen or fail to improve, for 4 to 6-week diabetes and INR.  Counseling provided for all of the vaccine components Orders Placed This Encounter  Procedures  . CoaguChek XS/INR [place], MD [facility] 06/07/2020, 10:54 AM

## 2020-06-18 ENCOUNTER — Ambulatory Visit: Payer: Medicare Other | Admitting: Licensed Clinical Social Worker

## 2020-06-18 DIAGNOSIS — I2510 Atherosclerotic heart disease of native coronary artery without angina pectoris: Secondary | ICD-10-CM

## 2020-06-18 DIAGNOSIS — N400 Enlarged prostate without lower urinary tract symptoms: Secondary | ICD-10-CM

## 2020-06-18 DIAGNOSIS — K219 Gastro-esophageal reflux disease without esophagitis: Secondary | ICD-10-CM

## 2020-06-18 DIAGNOSIS — M1712 Unilateral primary osteoarthritis, left knee: Secondary | ICD-10-CM

## 2020-06-18 DIAGNOSIS — I4819 Other persistent atrial fibrillation: Secondary | ICD-10-CM

## 2020-06-18 DIAGNOSIS — I10 Essential (primary) hypertension: Secondary | ICD-10-CM

## 2020-06-18 NOTE — Chronic Care Management (AMB) (Signed)
Chronic Care Management    Clinical Social Work Follow Up Note  06/18/2020 Name: [name] MRN: [id number] DOB: 03/02/1932  [name] is a 86 y.o. year old male who is a primary care patient of [name], Greg Kaufmann, MD. The CCM team was consulted for assistance with [name] .   Review of patient status, including review of consultants reports, other relevant assessments, and collaboration with appropriate care team members and the patient's provider was performed as part of comprehensive patient evaluation and provision of chronic care management services.    SDOH (Social Determinants of Health) assessments performed: No; risk for tobacco use;risk for depression; risk for stress; risk for physical inactivity    Chronic Care Management from 03/04/2020 in [place]  PHQ-9 Total Score 4      GAD 7 : Generalized Anxiety Score 03/04/2020  Nervous, Anxious, on Edge 1  Control/stop worrying 0  Worry too much - different things 0  Trouble relaxing 0  Restless 0  Easily annoyed or irritable 0  Afraid - awful might happen 0  Total GAD 7 Score 1  Anxiety Difficulty Somewhat difficult    Outpatient Encounter Medications as of 06/18/2020  Medication Sig   acetaminophen (TYLENOL) 500 MG tablet Take 1,000 mg by mouth every 6 (six) hours as needed for moderate pain.   Cholecalciferol (VITAMIN D3) 5000 units CAPS Take 5,000 Units by mouth once a week.    diltiazem (CARDIZEM CD) 120 MG 24 hr capsule TAKE ONE (1) CAPSULE EACH DAY (Patient taking differently: Take 120 mg by mouth daily. )   ezetimibe (ZETIA) 10 MG tablet TAKE ONE (1) TABLET EACH DAY (Patient taking differently: Take 10 mg by mouth daily. )   finasteride (PROSCAR) 5 MG tablet TAKE ONE (1) TABLET EACH DAY (Patient taking differently: Take 5 mg by mouth daily. )   fluticasone (FLONASE) 50 MCG/ACT nasal spray Place 1 spray into both nostrils daily as needed for allergies or rhinitis.    furosemide (LASIX) 20 MG tablet TAKE ONE (1) TABLET EACH DAY (Patient taking differently: Take 20 mg by mouth daily. )   lovastatin (MEVACOR) 20 MG tablet Take 0.5 tablets (10 mg total) by mouth every other day.   metoprolol (TOPROL-XL) 200 MG 24 hr tablet TAKE ONE (1) TABLET EACH DAY (Patient taking differently: Take 200 mg by mouth daily. )   Polyethyl Glycol-Propyl Glycol (SYSTANE OP) Place 1 drop into both eyes 3 (three) times daily as needed (dry eyes).    warfarin (COUMADIN) 5 MG tablet TAKE 1 TABLET ON SAT, SUN, TUES, & THURSTAKE 1/2 TABLET ON MON, WED, & FRIDAY   No facility-administered encounter medications on file as of 06/18/2020.   Goals      Client will talk with LCSW in next 30 days to discuss [name] and completion of daily activities (pt-stated)      CARE PLAN ENTRY  Current Barriers:   Pain issues in [name], Atrial Fibrillation, GERD, OA, [name], [name], CHF  Clinical Social Work Clinical Goal(s):   LCSW will call client in next 30 days to talk with [name] and about his completion of daily activities  Interventions:  Talked with client about CCM program support  Talked with client about ambulation challenges of client  Talked with client about vision challenges of client  Talked with client about transport needs of client  Talked with client about relaxation techniques of  client (likes to work outdoors, care for their yard, visit with family and friends)  Talked with client about upcoming client medical appointments  Talked with client about his energy level and occasional decreased energy on exertion  Talked with client about occasional shortness of breath  Talked with client about his use of grab bars in the home  Talked with client about sleeping issues  Talked with client about appetite of client  Talked with client about ADLs completion of client  Talked with client  about food procurement of client (client drives to grocery store to obtain needed food items)  Talked with [name] about health status of his spouse  Talked with [name] about social support network (help from brother and other family members)  Encouraged [name] to call [name] as needed for nursing support  Patient Self Care Activities:  Completes ADLs independently Drives to appointments and to complete errands  Patient Self Care Deficits:   Pain issues in hips  Initial goal documentation        Follow Up Plan: LCSW to call client/spouse in next 4 weeks to talk with client /spouse about client completion of ADLs and to talk about client completion of daily activities  Norva Riffle.[name] MSW, LCSW Licensed Clinical Social Worker [name] [phone]

## 2020-06-18 NOTE — Patient Instructions (Addendum)
Licensed Clinical Social Worker Visit Information  Goals we discussed today:  .  Client will talk with LCSW in next 30 days to discuss [name] and completion of daily activities (pt-stated)        CARE PLAN ENTRY  Current Barriers:   Pain issues in [name], Atrial Fibrillation, GERD, OA, [name], [name], CHF  Clinical Social Work Clinical Goal(s):   LCSW will call client in next 30 days to talk with [name] and about his completion of daily activities  Interventions:  Talked with client about CCM program support  Talked with client about ambulation challenges of client  Talked with client about vision challenges of client  Talked with client about transport needs of client  Talked with client about relaxation techniques of client (likes to work outdoors, care for their yard, visit with family and friends)  Talked with client about upcoming client medical appointments  Talked with client about his energy level and occasional decreased energy on exertion  Talked with client about occasional shortness of breath  Talked with client about his use of grab bars in the home  Talked with client about sleeping issues  Talked with client about appetite of client  Talked with client about ADLs completion of client  Talked with client about food procurement of client (client drives to grocery store to obtain needed food items)  Talked with [name] about health status of his spouse  Talked with Keymari about social support network (help from brother and other family members)  Encouraged Sylvanus to call [name] as needed for nursing support  Patient Self Care Activities:  Completes ADLs independently Drives to appointments and to complete errands  Patient Self Care Deficits:   Pain issues in hips  Initial goal documentation        Follow Up Plan: LCSW to call client/spousein next 4 weeks to talk  with client /spouseaboutclientcompletion of ADLs and to talk aboutclientcompletion of daily activities  Materials Provided: No  The patient verbalized understanding of instructions provided today and declined a print copy of patient instruction materials.   Norva Riffle.Kamoni Depree MSW, LCSW Licensed Clinical Social Worker [facility] [phone]

## 2020-07-19 ENCOUNTER — Ambulatory Visit (INDEPENDENT_AMBULATORY_CARE_PROVIDER_SITE_OTHER): Payer: Medicare Other

## 2020-07-19 ENCOUNTER — Other Ambulatory Visit: Payer: Self-pay

## 2020-07-19 DIAGNOSIS — Z23 Encounter for immunization: Secondary | ICD-10-CM

## 2020-07-22 ENCOUNTER — Ambulatory Visit: Payer: Medicare Other | Admitting: Licensed Clinical Social Worker

## 2020-07-22 DIAGNOSIS — R972 Elevated prostate specific antigen [PSA]: Secondary | ICD-10-CM

## 2020-07-22 DIAGNOSIS — K219 Gastro-esophageal reflux disease without esophagitis: Secondary | ICD-10-CM

## 2020-07-22 DIAGNOSIS — M1712 Unilateral primary osteoarthritis, left knee: Secondary | ICD-10-CM

## 2020-07-22 DIAGNOSIS — I4819 Other persistent atrial fibrillation: Secondary | ICD-10-CM

## 2020-07-22 DIAGNOSIS — I2510 Atherosclerotic heart disease of native coronary artery without angina pectoris: Secondary | ICD-10-CM

## 2020-07-22 DIAGNOSIS — I10 Essential (primary) hypertension: Secondary | ICD-10-CM

## 2020-07-22 NOTE — Patient Instructions (Addendum)
Licensed Clinical Social Worker Visit Information  Materials Provided: No  07/22/2020  Name: GIFFORD BALLON MRN: [id number]       DOB: [birth year]  AVREY HYSER is a 86 y.o. year old male who is a primary care patient of [name], Fransisca Kaufmann, MD. The CCM team was consulted for assistance with [name] .   Review of patient status, including review of consultants reports, other relevant assessments, and collaboration with appropriate care team members and the patient's provider was performed as part of comprehensive patient evaluation and provision of chronic care management services.    SDOH (Social Determinants of Health) assessments performed: No;risk for social isolation; risk for tobacco use; risk for depression; risk for stress; risk for physical inactivity  LCSW called home phone number and cell number of client today but LCSW was not able to speak via phone with client today. LCSW did leave phone message for Grantham Hippert requesting that he please return call to LCSW at [phone]  Follow Up Plan: LCSW to call client/spousein next 4 weeks to talk with client /spouseaboutclientcompletion of ADLs and to talk aboutclientcompletion of daily activities  LCSW was not able to speak via phone today with client or spouse of client; thus the patient or spouse of patient were not able to verbalize understanding of instructions provided today and were not able to accept or decline a print copy of patient instruction materials.   Norva Riffle.Chandra Asher MSW, LCSW Licensed Clinical Social Worker [facility] [phone]

## 2020-07-22 NOTE — Chronic Care Management (AMB) (Signed)
  Chronic Care Management    Clinical Social Work Follow Up Note  07/22/2020 Name: [name] MRN: [id number] DOB: [birth year]  [name] is a 86 y.o. year old male who is a primary care patient of [name], Fransisca Kaufmann, MD. The CCM team was consulted for assistance with [name] .   Review of patient status, including review of consultants reports, other relevant assessments, and collaboration with appropriate care team members and the patient's provider was performed as part of comprehensive patient evaluation and provision of chronic care management services.    SDOH (Social Determinants of Health) assessments performed: No;risk for social isolation; risk for tobacco use; risk for depression; risk for stress; risk for physical inactivity    Chronic Care Management from 03/04/2020 in [place]  PHQ-9 Total Score 4     GAD 7 : Generalized Anxiety Score 03/04/2020  Nervous, Anxious, on Edge 1  Control/stop worrying 0  Worry too much - different things 0  Trouble relaxing 0  Restless 0  Easily annoyed or irritable 0  Afraid - awful might happen 0  Total GAD 7 Score 1  Anxiety Difficulty Somewhat difficult    Outpatient Encounter Medications as of 07/22/2020  Medication Sig  . acetaminophen (TYLENOL) 500 MG tablet Take 1,000 mg by mouth every 6 (six) hours as needed for moderate pain.  . Cholecalciferol (VITAMIN D3) 5000 units CAPS Take 5,000 Units by mouth once a week.   . diltiazem (CARDIZEM CD) 120 MG 24 hr capsule TAKE ONE (1) CAPSULE EACH DAY (Patient taking differently: Take 120 mg by mouth daily. )  . ezetimibe (ZETIA) 10 MG tablet TAKE ONE (1) TABLET EACH DAY (Patient taking differently: Take 10 mg by mouth daily. )  . finasteride (PROSCAR) 5 MG tablet TAKE ONE (1) TABLET EACH DAY (Patient taking differently: Take 5 mg by mouth daily. )  . fluticasone (FLONASE) 50 MCG/ACT nasal spray Place 1 spray into both nostrils daily as needed for  allergies or rhinitis.  . furosemide (LASIX) 20 MG tablet TAKE ONE (1) TABLET EACH DAY (Patient taking differently: Take 20 mg by mouth daily. )  . lovastatin (MEVACOR) 20 MG tablet Take 0.5 tablets (10 mg total) by mouth every other day.  . metoprolol (TOPROL-XL) 200 MG 24 hr tablet TAKE ONE (1) TABLET EACH DAY (Patient taking differently: Take 200 mg by mouth daily. )  . Polyethyl Glycol-Propyl Glycol (SYSTANE OP) Place 1 drop into both eyes 3 (three) times daily as needed (dry eyes).   . warfarin (COUMADIN) 5 MG tablet TAKE 1 TABLET ON SAT, SUN, TUES, & THURSTAKE 1/2 TABLET ON MON, WED, & FRIDAY   No facility-administered encounter medications on file as of 07/22/2020.    LCSW called home phone number and cell number of client today but LCSW was not able to speak via phone with client today. LCSW did leave phone message for [name] requesting that he please return call to LCSW at [phone]  Follow Up Plan: LCSW to call client/spousein next 4 weeks to talk with client /spouseaboutclientcompletion of ADLs and to talk aboutclientcompletion of daily activities  Norva Riffle.Jerron Niblack MSW, LCSW Licensed Clinical Social Worker [facility] [phone]

## 2020-07-23 ENCOUNTER — Other Ambulatory Visit: Payer: Self-pay | Admitting: Family Medicine

## 2020-08-02 ENCOUNTER — Other Ambulatory Visit: Payer: Self-pay | Admitting: Family Medicine

## 2020-08-12 ENCOUNTER — Other Ambulatory Visit: Payer: Self-pay

## 2020-08-12 ENCOUNTER — Encounter: Payer: Self-pay | Admitting: Family Medicine

## 2020-08-12 ENCOUNTER — Ambulatory Visit (INDEPENDENT_AMBULATORY_CARE_PROVIDER_SITE_OTHER): Payer: Medicare Other | Admitting: Family Medicine

## 2020-08-12 VITALS — BP 168/94 | HR 83 | Temp 97.6°F | Ht 67.0 in | Wt 184.6 lb

## 2020-08-12 DIAGNOSIS — I4819 Other persistent atrial fibrillation: Secondary | ICD-10-CM

## 2020-08-12 DIAGNOSIS — I509 Heart failure, unspecified: Secondary | ICD-10-CM | POA: Diagnosis not present

## 2020-08-12 DIAGNOSIS — R7303 Prediabetes: Secondary | ICD-10-CM | POA: Diagnosis not present

## 2020-08-12 DIAGNOSIS — E785 Hyperlipidemia, unspecified: Secondary | ICD-10-CM

## 2020-08-12 DIAGNOSIS — I10 Essential (primary) hypertension: Secondary | ICD-10-CM | POA: Diagnosis not present

## 2020-08-12 DIAGNOSIS — Z7901 Long term (current) use of anticoagulants: Secondary | ICD-10-CM

## 2020-08-12 LAB — BAYER DCA HB A1C WAIVED: HB A1C (BAYER DCA - WAIVED): 6.2 % (ref <7.0)

## 2020-08-12 LAB — COAGUCHEK XS/INR WAIVED
INR: 2 — ABNORMAL HIGH (ref 0.9–1.1)
Prothrombin Time: 24.5 s

## 2020-08-12 NOTE — Progress Notes (Signed)
BP (!) 168/94   Pulse 83   Temp 97.6 F (36.4 C)   Ht 5' 7"  (1.702 m)   Wt 184 lb 9.6 oz (83.7 kg)   SpO2 97%   BMI 28.91 kg/m    Subjective:   Patient ID: [name], male    DOB: 04/03/1932, 86 y.o.   MRN: [id number]  HPI: [name] is a 86 y.o. male presenting on 08/12/2020 for Medical Management of Chronic Issues   HPI Prediabetes Patient comes in today for recheck of his diabetes. Patient has been currently taking no medication currently and that has been diet controlled. Patient is not currently on an ACE inhibitor/ARB. Patient has seen an ophthalmologist this year. Patient denies any issues with their feet. The symptom started onset as an adult hypertension and CHF and hyperlipidemia ARE RELATED TO DM   Hypertension and CHF Patient is currently on metoprolol and furosemide and diltiazem, and their blood pressure today is 156/89 but he says at home it runs in the 140s over 80s all the time and is just up in the office. Patient denies any lightheadedness or dizziness. Patient denies headaches, blurred vision, chest pains, shortness of breath, or weakness. Denies any side effects from medication and is content with current medication.   Hyperlipidemia Patient is coming in for recheck of his hyperlipidemia. The patient is currently taking Zetia and lovastatin. They deny any issues with myalgias or history of liver damage from it. They deny any focal numbness or weakness or chest pain.   Coumadin recheck Target goal: 2.0-3.0 Reason on anticoagulation: Chronic A. fib Patient denies any bruising or bleeding or chest pain or palpitations   Relevant past medical, surgical, family and social history reviewed and updated as indicated. Interim medical history since our last visit reviewed. Allergies and medications reviewed and updated.  Review of Systems  Constitutional: Negative for chills and fever.  Respiratory: Negative for shortness of breath and wheezing.     Cardiovascular: Negative for chest pain and leg swelling.  Musculoskeletal: Negative for back pain and gait problem.  Skin: Negative for rash.  Neurological: Negative for dizziness, weakness and light-headedness.  All other systems reviewed and are negative.   Per HPI unless specifically indicated above   Allergies as of 08/12/2020      Reactions   Penicillins Hives, Rash   Has patient had a PCN reaction causing immediate rash, facial/tongue/throat swelling, SOB or lightheadedness with hypotension: Yes Has patient had a PCN reaction causing severe rash involving mucus membranes or skin necrosis: Yes Has patient had a PCN reaction that required hospitalization: No Has patient had a PCN reaction occurring within the last 10 years: No If all of the above answers are "NO", then may proceed with Cephalosporin use.   Hct [hydrochlorothiazide] Other (See Comments)   hyper   Statins Other (See Comments)   Myalgia, tolerates low dosages of lovastatin       Medication List       Accurate as of August 12, 2020  9:12 AM. If you have any questions, ask your nurse or doctor.        acetaminophen 500 MG tablet Commonly known as: TYLENOL Take 1,000 mg by mouth every 6 (six) hours as needed for moderate pain.   diltiazem 120 MG 24 hr capsule Commonly known as: CARDIZEM CD TAKE ONE (1) CAPSULE EACH DAY   ezetimibe 10 MG tablet Commonly known as: ZETIA TAKE ONE (1) TABLET EACH DAY What changed:  how much to take  how to take this  when to take this  additional instructions   finasteride 5 MG tablet Commonly known as: PROSCAR TAKE ONE (1) TABLET EACH DAY What changed:   how much to take  how to take this  when to take this  additional instructions   fluticasone 50 MCG/ACT nasal spray Commonly known as: FLONASE Place 1 spray into both nostrils daily as needed for allergies or rhinitis.   furosemide 20 MG tablet Commonly known as: LASIX TAKE ONE (1) TABLET EACH  DAY   lovastatin 20 MG tablet Commonly known as: MEVACOR Take 0.5 tablets (10 mg total) by mouth every other day.   metoprolol 200 MG 24 hr tablet Commonly known as: TOPROL-XL TAKE ONE (1) TABLET EACH DAY   SYSTANE OP Place 1 drop into both eyes 3 (three) times daily as needed (dry eyes).   Vitamin D3 125 MCG (5000 UT) Caps Take 5,000 Units by mouth once a week.   warfarin 5 MG tablet Commonly known as: COUMADIN Take as directed by the anticoagulation clinic. If you are unsure how to take this medication, talk to your nurse or doctor. Original instructions: TAKE 1 TABLET ON SAT, SUN, TUES, & THURSTAKE 1/2 TABLET ON MON, WED, & FRIDAY        Objective:   BP (!) 168/94   Pulse 83   Temp 97.6 F (36.4 C)   Ht 5' 7"  (1.702 m)   Wt 184 lb 9.6 oz (83.7 kg)   SpO2 97%   BMI 28.91 kg/m   Wt Readings from Last 3 Encounters:  08/12/20 184 lb 9.6 oz (83.7 kg)  06/07/20 182 lb (82.6 kg)  04/12/20 181 lb (82.1 kg)    Physical Exam Vitals and nursing note reviewed.  Constitutional:      General: He is not in acute distress.    Appearance: He is well-developed. He is not diaphoretic.  Eyes:     General: No scleral icterus.    Conjunctiva/sclera: Conjunctivae normal.  Neck:     Thyroid: No thyromegaly.  Cardiovascular:     Rate and Rhythm: Normal rate. Rhythm irregular.     Heart sounds: Normal heart sounds. No murmur heard.   Pulmonary:     Effort: Pulmonary effort is normal. No respiratory distress.     Breath sounds: Normal breath sounds. No wheezing.  Musculoskeletal:        General: Swelling (Trace bilateral lower extremity edema) present. Normal range of motion.     Cervical back: Neck supple.  Lymphadenopathy:     Cervical: No cervical adenopathy.  Skin:    General: Skin is warm and dry.     Findings: No rash.  Neurological:     Mental Status: He is alert and oriented to person, place, and time.     Coordination: Coordination normal.  Psychiatric:         Behavior: Behavior normal.       Assessment & Plan:   Problem List Items Addressed This Visit      Cardiovascular and Mediastinum   Essential hypertension   Relevant Orders   CBC with Differential/Platelet   CMP14+EGFR   Atrial fibrillation, persistent (HCC)   Relevant Orders   CBC with Differential/Platelet   CoaguChek XS/INR Waived   CHF (congestive heart failure) ([place])   Relevant Orders   CMP14+EGFR     Other   Long term current use of anticoagulant therapy   Dyslipidemia   Relevant Orders  Lipid panel   Prediabetes - Primary   Relevant Orders   Bayer DCA Hb A1c Waived      Doing well, no change in medication, see back in 6 months for his normal visit and 6 to 8 weeks for INR  Description   Continue weekly dose by taking 1 tablet every day except for Mondays and Thursdays take 1/2 tablet  INR today is  2.0 (goal is 2-3)    Recheck in 6-8 weeks     Follow up plan: Return if symptoms worsen or fail to improve, for 6 to 8-week INR recheck.  Counseling provided for all of the vaccine components Orders Placed This Encounter  Procedures  . CBC with Differential/Platelet  . CMP14+EGFR  . Lipid panel  . CoaguChek XS/INR Waived  . Bayer [facility] Hb A1c Waived    Caryl Pina, MD [facility] 08/12/2020, 9:12 AM

## 2020-08-13 ENCOUNTER — Other Ambulatory Visit: Payer: Self-pay | Admitting: Family Medicine

## 2020-08-13 LAB — CMP14+EGFR
ALT: 14 IU/L (ref 0–44)
AST: 15 IU/L (ref 0–40)
Albumin/Globulin Ratio: 1.8 (ref 1.2–2.2)
Albumin: 4.4 g/dL (ref 3.6–4.6)
Alkaline Phosphatase: 69 IU/L (ref 44–121)
BUN/Creatinine Ratio: 12 (ref 10–24)
BUN: 13 mg/dL (ref 8–27)
Bilirubin Total: 0.5 mg/dL (ref 0.0–1.2)
CO2: 25 mmol/L (ref 20–29)
Calcium: 9.6 mg/dL (ref 8.6–10.2)
Chloride: 102 mmol/L (ref 96–106)
Creatinine, Ser: 1.07 mg/dL (ref 0.76–1.27)
GFR calc Af Amer: 73 mL/min/{1.73_m2} (ref >59)
GFR calc non Af Amer: 63 mL/min/{1.73_m2} (ref >59)
Globulin, Total: 2.5 g/dL (ref 1.5–4.5)
Glucose: 107 mg/dL — ABNORMAL HIGH (ref 65–99)
Potassium: 4.3 mmol/L (ref 3.5–5.2)
Sodium: 142 mmol/L (ref 134–144)
Total Protein: 6.9 g/dL (ref 6.0–8.5)

## 2020-08-13 LAB — CBC WITH DIFFERENTIAL/PLATELET
Basophils Absolute: 0.1 x10E3/uL (ref 0.0–0.2)
Basos: 0 %
EOS (ABSOLUTE): 0.6 x10E3/uL — ABNORMAL HIGH (ref 0.0–0.4)
Eos: 5 %
Hematocrit: 44.9 % (ref 37.5–51.0)
Hemoglobin: 14.9 g/dL (ref 13.0–17.7)
Immature Grans (Abs): 0 x10E3/uL (ref 0.0–0.1)
Immature Granulocytes: 0 %
Lymphocytes Absolute: 5.7 x10E3/uL — ABNORMAL HIGH (ref 0.7–3.1)
Lymphs: 46 %
MCH: 32.5 pg (ref 26.6–33.0)
MCHC: 33.2 g/dL (ref 31.5–35.7)
MCV: 98 fL — ABNORMAL HIGH (ref 79–97)
Monocytes Absolute: 0.8 x10E3/uL (ref 0.1–0.9)
Monocytes: 6 %
Neutrophils Absolute: 5.3 x10E3/uL (ref 1.4–7.0)
Neutrophils: 43 %
Platelets: 216 x10E3/uL (ref 150–450)
RBC: 4.58 x10E6/uL (ref 4.14–5.80)
RDW: 11.6 % (ref 11.6–15.4)
WBC: 12.5 x10E3/uL — ABNORMAL HIGH (ref 3.4–10.8)

## 2020-08-13 LAB — LIPID PANEL
Chol/HDL Ratio: 3.9 ratio (ref 0.0–5.0)
Cholesterol, Total: 198 mg/dL (ref 100–199)
HDL: 51 mg/dL (ref >39)
LDL Chol Calc (NIH): 123 mg/dL — ABNORMAL HIGH (ref 0–99)
Triglycerides: 133 mg/dL (ref 0–149)
VLDL Cholesterol Cal: 24 mg/dL (ref 5–40)

## 2020-08-19 ENCOUNTER — Ambulatory Visit: Payer: Medicare Other | Admitting: Licensed Clinical Social Worker

## 2020-08-19 DIAGNOSIS — I10 Essential (primary) hypertension: Secondary | ICD-10-CM

## 2020-08-19 DIAGNOSIS — N400 Enlarged prostate without lower urinary tract symptoms: Secondary | ICD-10-CM

## 2020-08-19 DIAGNOSIS — M8949 Other hypertrophic osteoarthropathy, multiple sites: Secondary | ICD-10-CM

## 2020-08-19 DIAGNOSIS — R972 Elevated prostate specific antigen [PSA]: Secondary | ICD-10-CM

## 2020-08-19 DIAGNOSIS — I2510 Atherosclerotic heart disease of native coronary artery without angina pectoris: Secondary | ICD-10-CM

## 2020-08-19 DIAGNOSIS — I4819 Other persistent atrial fibrillation: Secondary | ICD-10-CM

## 2020-08-19 DIAGNOSIS — I509 Heart failure, unspecified: Secondary | ICD-10-CM

## 2020-08-19 DIAGNOSIS — M159 Polyosteoarthritis, unspecified: Secondary | ICD-10-CM

## 2020-08-19 DIAGNOSIS — K219 Gastro-esophageal reflux disease without esophagitis: Secondary | ICD-10-CM

## 2020-08-19 NOTE — Chronic Care Management (AMB) (Signed)
  Chronic Care Management    Clinical Social Work Follow Up Note  08/19/2020 Name: [name] MRN: [id number] DOB: March 28, 1932  [name] is a 86 y.o. year old male who is a primary care patient of [name], Greg Kaufmann, MD. The CCM team was consulted for assistance with [name] .   Review of patient status, including review of consultants reports, other relevant assessments, and collaboration with appropriate care team members and the patient's provider was performed as part of comprehensive patient evaluation and provision of chronic care management services.    SDOH (Social Determinants of Health) assessments performed: No; risk for tobacco use; risk for depression; risk for stress; risk for physical inactivity    Chronic Care Management from 03/04/2020 in [place]  PHQ-9 Total Score 4       GAD 7 : Generalized Anxiety Score 03/04/2020  Nervous, Anxious, on Edge 1  Control/stop worrying 0  Worry too much - different things 0  Trouble relaxing 0  Restless 0  Easily annoyed or irritable 0  Afraid - awful might happen 0  Total GAD 7 Score 1  Anxiety Difficulty Somewhat difficult    Outpatient Encounter Medications as of 08/19/2020  Medication Sig  . acetaminophen (TYLENOL) 500 MG tablet Take 1,000 mg by mouth every 6 (six) hours as needed for moderate pain.  . Cholecalciferol (VITAMIN D3) 5000 units CAPS Take 5,000 Units by mouth once a week.   . diltiazem (CARDIZEM CD) 120 MG 24 hr capsule TAKE ONE (1) CAPSULE EACH DAY  . ezetimibe (ZETIA) 10 MG tablet TAKE ONE (1) TABLET EACH DAY (Patient taking differently: Take 10 mg by mouth daily. )  . finasteride (PROSCAR) 5 MG tablet TAKE ONE (1) TABLET EACH DAY  . fluticasone (FLONASE) 50 MCG/ACT nasal spray Place 1 spray into both nostrils daily as needed for allergies or rhinitis.  . furosemide (LASIX) 20 MG tablet TAKE ONE (1) TABLET EACH DAY  . lovastatin (MEVACOR) 20 MG tablet Take 0.5 tablets  (10 mg total) by mouth every other day.  . metoprolol (TOPROL-XL) 200 MG 24 hr tablet TAKE ONE (1) TABLET EACH DAY  . Polyethyl Glycol-Propyl Glycol (SYSTANE OP) Place 1 drop into both eyes 3 (three) times daily as needed (dry eyes).   . warfarin (COUMADIN) 5 MG tablet TAKE 1 TABLET ON SAT, SUN, TUES, & THURSTAKE 1/2 TABLET ON MON, WED, & FRIDAY   No facility-administered encounter medications on file as of 08/19/2020.    LCSW called client's home and cell phone numbers several times today but LCSW was not able to speak via phone with client today.  LCSW did request for client to return call to LCSW at [phone]  Follow Up Plan: LCSW to call client/spousein next 4 weeks to talk with client /spouseaboutclientcompletion of ADLs and to talk aboutclientcompletion of daily activities  [name].[name] MSW, LCSW Licensed Clinical Social Worker [facility] [phone]

## 2020-08-19 NOTE — Patient Instructions (Addendum)
Licensed Clinical Social Worker Visit Information  Materials Provided: No  08/19/2020  Name: [name] MRN: [id number]       DOB: 1932-07-12  [name] is a 86 y.o. year old male who is a primary care patient of [name], Fransisca Kaufmann, MD. The CCM team was consulted for assistance with [name] .   Review of patient status, including review of consultants reports, other relevant assessments, and collaboration with appropriate care team members and the patient's provider was performed as part of comprehensive patient evaluation and provision of chronic care management services.    SDOH (Social Determinants of Health) assessments performed: No; risk for tobacco use; risk for depression; risk for stress; risk for physical inactivity  LCSW called client's home and cell phone numbers several times today but LCSW was not able to speak via phone with client today.  LCSW did request for client to return call to LCSW at [phone]  Follow Up Plan: LCSW to call client/spousein next 4 weeks to talk with client /spouseaboutclientcompletion of ADLs and to talk aboutclientcompletion of daily activities  LCSW was not able to speak with client via phone today; thus the client was not able to  verbalize understanding of instructions provided today and was not able to accept or decline a print copy of patient instruction materials.   [name] MSW, LCSW Licensed Clinical Social Worker [facility] [phone]

## 2020-09-23 ENCOUNTER — Ambulatory Visit: Payer: Medicare Other | Admitting: Licensed Clinical Social Worker

## 2020-09-23 DIAGNOSIS — M159 Polyosteoarthritis, unspecified: Secondary | ICD-10-CM

## 2020-09-23 DIAGNOSIS — I10 Essential (primary) hypertension: Secondary | ICD-10-CM

## 2020-09-23 DIAGNOSIS — R972 Elevated prostate specific antigen [PSA]: Secondary | ICD-10-CM

## 2020-09-23 DIAGNOSIS — I2510 Atherosclerotic heart disease of native coronary artery without angina pectoris: Secondary | ICD-10-CM

## 2020-09-23 DIAGNOSIS — K219 Gastro-esophageal reflux disease without esophagitis: Secondary | ICD-10-CM

## 2020-09-23 DIAGNOSIS — I4819 Other persistent atrial fibrillation: Secondary | ICD-10-CM

## 2020-09-23 DIAGNOSIS — I509 Heart failure, unspecified: Secondary | ICD-10-CM

## 2020-09-23 NOTE — Patient Instructions (Addendum)
Licensed Clinical Social Worker Visit Information  Goals we discussed today:   .  Client will talk with LCSW in next 30 days to discuss [name] and completion of daily activities (pt-stated)        CARE PLAN ENTRY  Current Barriers:   Pain issues in [name], Atrial Fibrillation, GERD, OA, [name], [name], CHF  Clinical Social Work Clinical Goal(s):   LCSW will call client in next 30 days to talk with [name] and about his completion of daily activities  Interventions:   Talked with [name], spouse of client about client upcoming appointments  Talked with [name] about appetite of client   Talked with [name] about ambulation of client   Talked with [name] about vision challenges of client  Talked with [name] about transport needs of client  Talked with [name] about energy level of client   Talked with [name] about occasional shortness of breath of client    Talked with [name] about client use of grab bars in the home  Talked with [name]  about sleeping issues of client  Talked with [name] about ADLs completion of client  Encouraged [name]/Greg to call [name] as needed for nursing support for [place]  Talked with [name] about CCM program support for [place]  Patient Self Care Activities:  Completes ADLs independently Drives to appointments and to complete errands  Patient Self Care Deficits:   Pain issues in hips  Initial goal documentation     Follow Up Plan:LCSW to call client/spousein next 4 weeks to talk with client /spouseaboutclientcompletion of ADLs and to talk aboutclientcompletion of daily activities  Materials Provided: No  The patient/[name], spouse of client, verbalized understanding of instructions provided today and declined a print copy of patient instruction materials.   Norva Riffle.Olden Klauer MSW, LCSW Licensed Clinical Social  Worker [facility] [phone]

## 2020-09-23 NOTE — Chronic Care Management (AMB) (Signed)
Chronic Care Management    Clinical Social Work Follow Up Note  09/23/2020 Name: [name] MRN: [id number] DOB: [birth year]  [name] is a 86 y.o. year old male who is a primary care patient of [name], Fransisca Kaufmann, MD. The CCM team was consulted for assistance with [name] .   Review of patient status, including review of consultants reports, other relevant assessments, and collaboration with appropriate care team members and the patient's provider was performed as part of comprehensive patient evaluation and provision of chronic care management services.    SDOH (Social Determinants of Health) assessments performed: No;risk for depression; risk for tobacco use; risk for social isolation;risk for stress; risk for physical inactivity  Flowsheet Row Chronic Care Management from 03/04/2020 in [place]  PHQ-9 Total Score 4       GAD 7 : Generalized Anxiety Score 03/04/2020  Nervous, Anxious, on Edge 1  Control/stop worrying 0  Worry too much - different things 0  Trouble relaxing 0  Restless 0  Easily annoyed or irritable 0  Afraid - awful might happen 0  Total GAD 7 Score 1  Anxiety Difficulty Somewhat difficult    Outpatient Encounter Medications as of 09/23/2020  Medication Sig  . acetaminophen (TYLENOL) 500 MG tablet Take 1,000 mg by mouth every 6 (six) hours as needed for moderate pain.  . Cholecalciferol (VITAMIN D3) 5000 units CAPS Take 5,000 Units by mouth once a week.   . diltiazem (CARDIZEM CD) 120 MG 24 hr capsule TAKE ONE (1) CAPSULE EACH DAY  . ezetimibe (ZETIA) 10 MG tablet TAKE ONE (1) TABLET EACH DAY (Patient taking differently: Take 10 mg by mouth daily. )  . finasteride (PROSCAR) 5 MG tablet TAKE ONE (1) TABLET EACH DAY  . fluticasone (FLONASE) 50 MCG/ACT nasal spray Place 1 spray into both nostrils daily as needed for allergies or rhinitis.  . furosemide (LASIX) 20 MG tablet TAKE ONE (1) TABLET EACH DAY  . lovastatin  (MEVACOR) 20 MG tablet Take 0.5 tablets (10 mg total) by mouth every other day.  . metoprolol (TOPROL-XL) 200 MG 24 hr tablet TAKE ONE (1) TABLET EACH DAY  . Polyethyl Glycol-Propyl Glycol (SYSTANE OP) Place 1 drop into both eyes 3 (three) times daily as needed (dry eyes).   . warfarin (COUMADIN) 5 MG tablet TAKE 1 TABLET ON SAT, SUN, TUES, & THURSTAKE 1/2 TABLET ON MON, WED, & FRIDAY   No facility-administered encounter medications on file as of 09/23/2020.    Goals    .  Client will talk with LCSW in next 30 days to discuss [name] and completion of daily activities (pt-stated)      CARE PLAN ENTRY  Current Barriers:  . Pain issues in [name], Atrial Fibrillation, GERD, OA, [name], [name], CHF  Clinical Social Work Clinical Goal(s):  Marland Kitchen LCSW will call client in next 30 days to talk with [name] and about his completion of daily activities  Interventions:   Talked with [name], spouse of client about client upcoming appointments  Talked with [name] about appetite of client   Talked with [name] about ambulation of client  . Talked with [name] about vision challenges of client . Talked with [name] about transport needs of client . Talked with [name] about energy level of client  . Talked with [name] about occasional shortness of breath of client   . Talked with [name] about client  use of grab bars in the home  Talked with [name]  about sleeping issues of client  Talked with [name] about ADLs completion of client  Encouraged [name] to call [name] as needed for nursing support for [name]  Talked with [name] about CCM program support for [name]  Patient Self Care Activities:  Completes ADLs independently Drives to appointments and to complete errands  Patient Self Care Deficits:  . Pain issues in hips  Initial goal documentation     Follow Up Plan: LCSW to call client/spousein  next 4 weeks to talk with client /spouseaboutclientcompletion of ADLs and to talk aboutclientcompletion of daily activities  Norva Riffle.Verba Ainley MSW, LCSW Licensed Clinical Social Worker [facility] [phone]

## 2020-09-27 ENCOUNTER — Encounter: Payer: Self-pay | Admitting: Family Medicine

## 2020-09-27 ENCOUNTER — Other Ambulatory Visit: Payer: Self-pay

## 2020-09-27 ENCOUNTER — Ambulatory Visit (INDEPENDENT_AMBULATORY_CARE_PROVIDER_SITE_OTHER): Payer: Medicare Other | Admitting: Family Medicine

## 2020-09-27 VITALS — BP 142/78 | HR 66 | Ht 67.0 in | Wt 189.0 lb

## 2020-09-27 DIAGNOSIS — Z7901 Long term (current) use of anticoagulants: Secondary | ICD-10-CM | POA: Diagnosis not present

## 2020-09-27 DIAGNOSIS — I4819 Other persistent atrial fibrillation: Secondary | ICD-10-CM | POA: Diagnosis not present

## 2020-09-27 LAB — COAGUCHEK XS/INR WAIVED
INR: 2.2 — ABNORMAL HIGH (ref 0.9–1.1)
Prothrombin Time: 26.9 s

## 2020-09-27 NOTE — Progress Notes (Signed)
BP (!) 142/78   Pulse 66   Ht 5\' 7"  (1.702 m)   Wt 189 lb (85.7 kg)   SpO2 96%   BMI 29.60 kg/m    Subjective:   Patient ID: [name], male    DOB: Oct 28, 1931, 86 y.o.   MRN: [id number]  HPI: [name] is a 86 y.o. male presenting on 09/27/2020 for Medical Management of Chronic Issues and Atrial Fibrillation   HPI Coumadin recheck Target goal: 2.0-3.0 Reason on anticoagulation: Chronic A. fib persistent Patient denies any bruising or bleeding or chest pain or palpitations   Relevant past medical, surgical, family and social history reviewed and updated as indicated. Interim medical history since our last visit reviewed. Allergies and medications reviewed and updated.  Review of Systems  Constitutional: Negative for chills and fever.  Respiratory: Negative for shortness of breath and wheezing.   Cardiovascular: Negative for chest pain and leg swelling.  Gastrointestinal: Negative for anal bleeding and blood in stool.  Genitourinary: Negative for hematuria.  Skin: Negative for rash and wound.  All other systems reviewed and are negative.   Per HPI unless specifically indicated above   Allergies as of 09/27/2020      Reactions   Penicillins Hives, Rash   Has patient had a PCN reaction causing immediate rash, facial/tongue/throat swelling, SOB or lightheadedness with hypotension: Yes Has patient had a PCN reaction causing severe rash involving mucus membranes or skin necrosis: Yes Has patient had a PCN reaction that required hospitalization: No Has patient had a PCN reaction occurring within the last 10 years: No If all of the above answers are "NO", then may proceed with Cephalosporin use.   Hct [hydrochlorothiazide] Other (See Comments)   hyper   Statins Other (See Comments)   Myalgia, tolerates low dosages of lovastatin       Medication List       Accurate as of September 27, 2020 10:16 AM. If you have any questions, ask your nurse or doctor.         acetaminophen 500 MG tablet Commonly known as: TYLENOL Take 1,000 mg by mouth every 6 (six) hours as needed for moderate pain.   diltiazem 120 MG 24 hr capsule Commonly known as: CARDIZEM CD TAKE ONE (1) CAPSULE EACH DAY   ezetimibe 10 MG tablet Commonly known as: ZETIA TAKE ONE (1) TABLET EACH DAY What changed:   how much to take  how to take this  when to take this  additional instructions   finasteride 5 MG tablet Commonly known as: PROSCAR TAKE ONE (1) TABLET EACH DAY   fluticasone 50 MCG/ACT nasal spray Commonly known as: FLONASE Place 1 spray into both nostrils daily as needed for allergies or rhinitis.   furosemide 20 MG tablet Commonly known as: LASIX TAKE ONE (1) TABLET EACH DAY   lovastatin 20 MG tablet Commonly known as: MEVACOR Take 0.5 tablets (10 mg total) by mouth every other day.   metoprolol 200 MG 24 hr tablet Commonly known as: TOPROL-XL TAKE ONE (1) TABLET EACH DAY   SYSTANE OP Place 1 drop into both eyes 3 (three) times daily as needed (dry eyes).   Vitamin D3 125 MCG (5000 UT) Caps Take 5,000 Units by mouth once a week.   warfarin 5 MG tablet Commonly known as: COUMADIN Take as directed by the anticoagulation clinic. If you are unsure how to take this medication, talk to your nurse or doctor. Original instructions: TAKE 1 TABLET ON SAT, SUN,  TUES, & THURSTAKE 1/2 TABLET ON MON, WED, & FRIDAY        Objective:   BP (!) 142/78   Pulse 66   Ht 5\' 7"  (1.702 m)   Wt 189 lb (85.7 kg)   SpO2 96%   BMI 29.60 kg/m   Wt Readings from Last 3 Encounters:  09/27/20 189 lb (85.7 kg)  08/12/20 184 lb 9.6 oz (83.7 kg)  06/07/20 182 lb (82.6 kg)    Physical Exam Vitals and nursing note reviewed.  Constitutional:      Appearance: He is normal weight.  Skin:    General: Skin is warm and dry.     Findings: No bruising.  Neurological:     Mental Status: He is alert.     Description   Continue weekly dose by taking 1 tablet every  day except for Mondays and Thursdays take 1/2 tablet  INR today is  2.2 (goal is 2-3)    Recheck in 6-8 weeks      Assessment & Plan:   Problem List Items Addressed This Visit      Cardiovascular and Mediastinum   Atrial fibrillation, persistent ([place]) - Primary   Relevant Orders   CoaguChek XS/INR Waived     Other   Long term current use of anticoagulant therapy       Follow up plan: Return if symptoms worsen or fail to improve, for 6 to 8-week INR recheck.  Counseling provided for all of the vaccine components Orders Placed This Encounter  Procedures  . CoaguChek XS/INR Kohler, MD [facility] 09/27/2020, 10:16 AM

## 2020-10-29 ENCOUNTER — Telehealth: Payer: Medicare Other

## 2020-11-27 ENCOUNTER — Other Ambulatory Visit: Payer: Self-pay

## 2020-11-27 ENCOUNTER — Ambulatory Visit (INDEPENDENT_AMBULATORY_CARE_PROVIDER_SITE_OTHER): Payer: Medicare Other | Admitting: Family Medicine

## 2020-11-27 ENCOUNTER — Encounter: Payer: Self-pay | Admitting: Family Medicine

## 2020-11-27 VITALS — BP 151/85 | HR 73 | Temp 98.1°F | Resp 20 | Ht 67.0 in | Wt 189.0 lb

## 2020-11-27 DIAGNOSIS — Z7901 Long term (current) use of anticoagulants: Secondary | ICD-10-CM | POA: Diagnosis not present

## 2020-11-27 DIAGNOSIS — R972 Elevated prostate specific antigen [PSA]: Secondary | ICD-10-CM

## 2020-11-27 DIAGNOSIS — I4819 Other persistent atrial fibrillation: Secondary | ICD-10-CM | POA: Diagnosis not present

## 2020-11-27 DIAGNOSIS — I10 Essential (primary) hypertension: Secondary | ICD-10-CM

## 2020-11-27 DIAGNOSIS — R7303 Prediabetes: Secondary | ICD-10-CM | POA: Diagnosis not present

## 2020-11-27 DIAGNOSIS — E785 Hyperlipidemia, unspecified: Secondary | ICD-10-CM | POA: Diagnosis not present

## 2020-11-27 LAB — CBC WITH DIFFERENTIAL/PLATELET
Basophils Absolute: 0.1 10*3/uL (ref 0.0–0.2)
Basos: 0 %
EOS (ABSOLUTE): 0.5 10*3/uL — ABNORMAL HIGH (ref 0.0–0.4)
Eos: 4 %
Hematocrit: 44.4 % (ref 37.5–51.0)
Hemoglobin: 14.9 g/dL (ref 13.0–17.7)
Immature Grans (Abs): 0 10*3/uL (ref 0.0–0.1)
Immature Granulocytes: 0 %
Lymphocytes Absolute: 5.1 10*3/uL — ABNORMAL HIGH (ref 0.7–3.1)
Lymphs: 46 %
MCH: 32.8 pg (ref 26.6–33.0)
MCHC: 33.6 g/dL (ref 31.5–35.7)
MCV: 98 fL — ABNORMAL HIGH (ref 79–97)
Monocytes Absolute: 0.6 10*3/uL (ref 0.1–0.9)
Monocytes: 5 %
Neutrophils Absolute: 5.1 10*3/uL (ref 1.4–7.0)
Neutrophils: 45 %
Platelets: 199 10*3/uL (ref 150–450)
RBC: 4.54 x10E6/uL (ref 4.14–5.80)
RDW: 11.8 % (ref 11.6–15.4)
WBC: 11.3 10*3/uL — ABNORMAL HIGH (ref 3.4–10.8)

## 2020-11-27 LAB — CMP14+EGFR
ALT: 16 IU/L (ref 0–44)
AST: 20 IU/L (ref 0–40)
Albumin/Globulin Ratio: 1.9 (ref 1.2–2.2)
Albumin: 4.5 g/dL (ref 3.6–4.6)
Alkaline Phosphatase: 64 IU/L (ref 44–121)
BUN/Creatinine Ratio: 17 (ref 10–24)
BUN: 19 mg/dL (ref 8–27)
Bilirubin Total: 0.6 mg/dL (ref 0.0–1.2)
CO2: 25 mmol/L (ref 20–29)
Calcium: 9.7 mg/dL (ref 8.6–10.2)
Chloride: 106 mmol/L (ref 96–106)
Creatinine, Ser: 1.11 mg/dL (ref 0.76–1.27)
GFR calc Af Amer: 69 mL/min/{1.73_m2} (ref >59)
GFR calc non Af Amer: 60 mL/min/{1.73_m2} (ref >59)
Globulin, Total: 2.4 g/dL (ref 1.5–4.5)
Glucose: 131 mg/dL — ABNORMAL HIGH (ref 65–99)
Potassium: 4.4 mmol/L (ref 3.5–5.2)
Sodium: 145 mmol/L — ABNORMAL HIGH (ref 134–144)
Total Protein: 6.9 g/dL (ref 6.0–8.5)

## 2020-11-27 LAB — LIPID PANEL
Chol/HDL Ratio: 3.8 ratio (ref 0.0–5.0)
Cholesterol, Total: 200 mg/dL — ABNORMAL HIGH (ref 100–199)
HDL: 52 mg/dL (ref >39)
LDL Chol Calc (NIH): 126 mg/dL — ABNORMAL HIGH (ref 0–99)
Triglycerides: 122 mg/dL (ref 0–149)
VLDL Cholesterol Cal: 22 mg/dL (ref 5–40)

## 2020-11-27 LAB — COAGUCHEK XS/INR WAIVED
INR: 2.4 — ABNORMAL HIGH (ref 0.9–1.1)
Prothrombin Time: 28.4 s

## 2020-11-27 LAB — BAYER DCA HB A1C WAIVED: HB A1C (BAYER DCA - WAIVED): 5.7 % (ref <7.0)

## 2020-11-27 NOTE — Patient Instructions (Signed)
With

## 2020-11-27 NOTE — Progress Notes (Signed)
BP (!) 151/85   Pulse 73   Temp 98.1 F (36.7 C) (Temporal)   Resp 20   Ht 5' 7"  (1.702 m)   Wt 189 lb (85.7 kg)   SpO2 98%   BMI 29.60 kg/m    Subjective:   Patient ID: [name], male    DOB: [birth year], 87 y.o.   MRN: [id number]  HPI: [name] is a 87 y.o. male presenting on 11/27/2020 for Recheck INR   HPI Coumadin recheck Target goal: 2 0-3.0 Reason on anticoagulation: Chronic A. fib Patient denies any bruising or bleeding or chest pain or palpitations   Relevant past medical, surgical, family and social history reviewed and updated as indicated. Interim medical history since our last visit reviewed. Allergies and medications reviewed and updated.  Review of Systems  Constitutional: Negative for chills and fever.  Respiratory: Negative for shortness of breath and wheezing.   Cardiovascular: Negative for chest pain and leg swelling.  Musculoskeletal: Negative for back pain and gait problem.  Skin: Negative for rash.  Neurological: Negative for dizziness, weakness and light-headedness.  All other systems reviewed and are negative.   Per HPI unless specifically indicated above   Allergies as of 11/27/2020      Reactions   Penicillins Hives, Rash   Has patient had a PCN reaction causing immediate rash, facial/tongue/throat swelling, SOB or lightheadedness with hypotension: Yes Has patient had a PCN reaction causing severe rash involving mucus membranes or skin necrosis: Yes Has patient had a PCN reaction that required hospitalization: No Has patient had a PCN reaction occurring within the last 10 years: No If all of the above answers are "NO", then may proceed with Cephalosporin use.   Hct [hydrochlorothiazide] Other (See Comments)   hyper   Statins Other (See Comments)   Myalgia, tolerates low dosages of lovastatin       Medication List       Accurate as of November 27, 2020  9:02 AM. If you have any questions, ask your nurse or doctor.         acetaminophen 500 MG tablet Commonly known as: TYLENOL Take 1,000 mg by mouth every 6 (six) hours as needed for moderate pain.   diltiazem 120 MG 24 hr capsule Commonly known as: CARDIZEM CD TAKE ONE (1) CAPSULE EACH DAY   ezetimibe 10 MG tablet Commonly known as: ZETIA TAKE ONE (1) TABLET EACH DAY What changed:   how much to take  how to take this  when to take this  additional instructions   finasteride 5 MG tablet Commonly known as: PROSCAR TAKE ONE (1) TABLET EACH DAY   fluticasone 50 MCG/ACT nasal spray Commonly known as: FLONASE Place 1 spray into both nostrils daily as needed for allergies or rhinitis.   furosemide 20 MG tablet Commonly known as: LASIX TAKE ONE (1) TABLET EACH DAY   lovastatin 20 MG tablet Commonly known as: MEVACOR Take 0.5 tablets (10 mg total) by mouth every other day.   metoprolol 200 MG 24 hr tablet Commonly known as: TOPROL-XL TAKE ONE (1) TABLET EACH DAY   SYSTANE OP Place 1 drop into both eyes 3 (three) times daily as needed (dry eyes).   Vitamin D3 125 MCG (5000 UT) Caps Take 5,000 Units by mouth once a week.   warfarin 5 MG tablet Commonly known as: COUMADIN Take as directed by the anticoagulation clinic. If you are unsure how to take this medication, talk to your nurse or doctor. Original  instructions: TAKE 1 TABLET ON SAT, SUN, TUES, & THURSTAKE 1/2 TABLET ON MON, WED, & FRIDAY        Objective:   BP (!) 151/85   Pulse 73   Temp 98.1 F (36.7 C) (Temporal)   Resp 20   Ht 5' 7"  (1.702 m)   Wt 189 lb (85.7 kg)   SpO2 98%   BMI 29.60 kg/m   Wt Readings from Last 3 Encounters:  11/27/20 189 lb (85.7 kg)  09/27/20 189 lb (85.7 kg)  08/12/20 184 lb 9.6 oz (83.7 kg)    Physical Exam Vitals and nursing note reviewed.  Constitutional:      General: He is not in acute distress.    Appearance: He is well-developed and well-nourished. He is not diaphoretic.  Eyes:     General: No scleral icterus.    Extraocular  Movements: EOM normal.     Conjunctiva/sclera: Conjunctivae normal.  Neck:     Thyroid: No thyromegaly.  Cardiovascular:     Pulses: Intact distal pulses.  Musculoskeletal:        General: No edema.     Cervical back: Neck supple.  Neurological:     Mental Status: He is alert and oriented to person, place, and time.     Coordination: Coordination normal.  Psychiatric:        Mood and Affect: Mood and affect normal.        Behavior: Behavior normal.     Results for orders placed or performed in visit on 09/27/20  CoaguChek XS/INR Waived  Result Value Ref Range   INR 2.2 (H) 0.9 - 1.1   Prothrombin Time 26.9 sec    Assessment & Plan:   Problem List Items Addressed This Visit      Cardiovascular and Mediastinum   Essential hypertension   Relevant Orders   CBC with Differential/Platelet   CMP14+EGFR   Atrial fibrillation, persistent ([facility]) - Primary   Relevant Orders   CoaguChek XS/INR Waived     Other   Dyslipidemia   Relevant Orders   Lipid panel   Prediabetes   Relevant Orders   Bayer DCA Hb A1c Waived   Microalbumin / creatinine urine ratio   Long term current use of anticoagulant therapy    Other Visit Diagnoses    Elevated prostate specific antigen (PSA)       Relevant Orders   PSA, total and free      Description   Continue weekly dose by taking 1 tablet every day except for Mondays and Thursdays take 1/2 tablet  INR today is  2.4 (goal is 2-3)    Recheck in 6-8 weeks     Follow up plan: Return if symptoms worsen or fail to improve, for 6 to 8-week INR recheck.  Counseling provided for all of the vaccine components Orders Placed This Encounter  Procedures  . Bayer DCA Hb A1c Waived  . CBC with Differential/Platelet  . CMP14+EGFR  . Lipid panel  . Microalbumin / creatinine urine ratio  . CoaguChek XS/INR Waived  . PSA, total and free    Caryl Pina, MD [facility] 11/27/2020, 9:02 AM

## 2020-11-28 ENCOUNTER — Other Ambulatory Visit: Payer: Self-pay | Admitting: Family Medicine

## 2020-11-29 LAB — PSA, TOTAL AND FREE
PSA, Free Pct: 11.2 %
PSA, Free: 1.29 ng/mL
Prostate Specific Ag, Serum: 11.5 ng/mL — ABNORMAL HIGH (ref 0.0–4.0)

## 2020-11-29 LAB — SPECIMEN STATUS REPORT

## 2020-12-02 ENCOUNTER — Ambulatory Visit (INDEPENDENT_AMBULATORY_CARE_PROVIDER_SITE_OTHER): Payer: Medicare Other | Admitting: Licensed Clinical Social Worker

## 2020-12-02 DIAGNOSIS — I10 Essential (primary) hypertension: Secondary | ICD-10-CM | POA: Diagnosis not present

## 2020-12-02 DIAGNOSIS — I509 Heart failure, unspecified: Secondary | ICD-10-CM | POA: Diagnosis not present

## 2020-12-02 DIAGNOSIS — M8949 Other hypertrophic osteoarthropathy, multiple sites: Secondary | ICD-10-CM | POA: Diagnosis not present

## 2020-12-02 DIAGNOSIS — I2510 Atherosclerotic heart disease of native coronary artery without angina pectoris: Secondary | ICD-10-CM | POA: Diagnosis not present

## 2020-12-02 DIAGNOSIS — M159 Polyosteoarthritis, unspecified: Secondary | ICD-10-CM

## 2020-12-02 DIAGNOSIS — R972 Elevated prostate specific antigen [PSA]: Secondary | ICD-10-CM

## 2020-12-02 DIAGNOSIS — M150 Primary generalized (osteo)arthritis: Secondary | ICD-10-CM

## 2020-12-02 DIAGNOSIS — K219 Gastro-esophageal reflux disease without esophagitis: Secondary | ICD-10-CM

## 2020-12-02 DIAGNOSIS — I4819 Other persistent atrial fibrillation: Secondary | ICD-10-CM

## 2020-12-02 DIAGNOSIS — N400 Enlarged prostate without lower urinary tract symptoms: Secondary | ICD-10-CM

## 2020-12-02 NOTE — Patient Instructions (Addendum)
Licensed Clinical Social Worker Visit Information  Goals we discussed today:   .  Client will talk with LCSW in next 30 days to discuss [name] and completion of daily activities (pt-stated)        CARE PLAN ENTRY  Current Barriers:   Pain issues in [name], Atrial Fibrillation, GERD, OA, [name], [name], CHF  Clinical Social Work Clinical Goal(s):   LCSW will call client in next 30 days to talk with [name] and about his completion of daily activities  Interventions:  Talked with [name], spouse of client , about client appetite Talked with [name] about client's recent visit with Dr. [name] Parisian Talked with client about his current needs Talked with client about energy of client Talked with client about sleeping issues of client Talked with client about ambulation challenges Talked with client about vision challenges of client Talked with client about transport needs of client Talked with client about upcoming client medical appointments Talked with client about occasional shortness of breath Talked with client about his use of grab bars in the home Talked with client about pain issues of client  Talked with client about relaxation techniques (likes to do projects in his shop at home to relax) Talked with client about medication procurement   Patient Self Care Activities:  Completes ADLs independently Drives to appointments and to complete errands  Patient Self Care Deficits:   Pain issues in hips  Initial goal documentation     Follow Up Plan:LCSW to call client/spousein next 4 weeks to talk with client /spouseaboutclientcompletion of ADLs and to talk aboutclientcompletion of daily activities  Materials Provided: No  The patient verbalized understanding of instructions provided today and declined a print copy of patient instruction materials.   [name].[name] MSW,  LCSW Licensed Clinical Social Worker [facility] Care Management [phone]

## 2020-12-02 NOTE — Chronic Care Management (AMB) (Signed)
Chronic Care Management    Clinical Social Work Follow Up Note  12/02/2020 Name: [name] MRN: [id number] DOB: [birth year]  [name] is a 87 y.o. year old male who is a primary care patient of [name], Fransisca Kaufmann, MD. The CCM team was consulted for assistance with [name] .   Review of patient status, including review of consultants reports, other relevant assessments, and collaboration with appropriate care team members and the patient's provider was performed as part of comprehensive patient evaluation and provision of chronic care management services.    SDOH (Social Determinants of Health) assessments performed: No:  Risk for tobacco use; risk for depression; risk for stress; risk for physical inactivity  Flowsheet Row Chronic Care Management from 03/04/2020 in [facility]  PHQ-9 Total Score 4      GAD 7 : Generalized Anxiety Score 03/04/2020  Nervous, Anxious, on Edge 1  Control/stop worrying 0  Worry too much - different things 0  Trouble relaxing 0  Restless 0  Easily annoyed or irritable 0  Afraid - awful might happen 0  Total GAD 7 Score 1  Anxiety Difficulty Somewhat difficult    Outpatient Encounter Medications as of 12/02/2020  Medication Sig  . acetaminophen (TYLENOL) 500 MG tablet Take 1,000 mg by mouth every 6 (six) hours as needed for moderate pain.  . Cholecalciferol (VITAMIN D3) 5000 units CAPS Take 5,000 Units by mouth once a week.   . diltiazem (CARDIZEM CD) 120 MG 24 hr capsule TAKE ONE (1) CAPSULE EACH DAY  . ezetimibe (ZETIA) 10 MG tablet TAKE ONE (1) TABLET EACH DAY  . finasteride (PROSCAR) 5 MG tablet TAKE ONE (1) TABLET EACH DAY  . fluticasone (FLONASE) 50 MCG/ACT nasal spray Place 1 spray into both nostrils daily as needed for allergies or rhinitis.  . furosemide (LASIX) 20 MG tablet TAKE ONE (1) TABLET EACH DAY  . lovastatin (MEVACOR) 20 MG tablet Take 0.5 tablets (10 mg total) by mouth every other day.  .  metoprolol (TOPROL-XL) 200 MG 24 hr tablet TAKE ONE (1) TABLET EACH DAY  . Polyethyl Glycol-Propyl Glycol (SYSTANE OP) Place 1 drop into both eyes 3 (three) times daily as needed (dry eyes).   . warfarin (COUMADIN) 5 MG tablet TAKE 1 TABLET ON SAT, SUN, TUES, & THURSTAKE 1/2 TABLET ON MON, WED, & FRIDAY   No facility-administered encounter medications on file as of 12/02/2020.    Goals    .  Client will talk with LCSW in next 30 days to discuss [name] and completion of daily activities (pt-stated)      CARE PLAN ENTRY  Current Barriers:  . Pain issues in [name], Atrial Fibrillation, GERD, OA, [name], [name], CHF  Clinical Social Work Clinical Goal(s):  Marland Kitchen LCSW will call client in next 30 days to talk with [name] and about his completion of daily activities  Interventions:  Talked with [name], spouse of client , about client appetite Talked with [name] about client's recent visit with Dr. [name] Parisian Talked with client about his current needs Talked with client about energy of client Talked with client about sleeping issues of client Talked with client about ambulation challenges Talked with client about vision challenges of client Talked with client about transport needs of client Talked with client about upcoming client medical appointments Talked with client about occasional shortness of breath Talked with client about his use of grab  bars in the home Talked with client about pain issues of client  Talked with client about relaxation techniques (likes to do projects in his shop at home to relax) Talked with client about medication procurement   Patient Self Care Activities:  Completes ADLs independently Drives to appointments and to complete errands  Patient Self Care Deficits:  . Pain issues in hips  Initial goal documentation     Follow Up Plan:LCSW to call client/spousein next 4 weeks  to talk with client /spouseaboutclientcompletion of ADLs and to talk aboutclientcompletion of daily activities  Norva Riffle.Forrest MSW, LCSW Licensed Clinical Social Worker [facility] Care Management [phone]

## 2020-12-03 ENCOUNTER — Encounter: Payer: Self-pay | Admitting: Family Medicine

## 2020-12-10 ENCOUNTER — Ambulatory Visit (INDEPENDENT_AMBULATORY_CARE_PROVIDER_SITE_OTHER): Payer: Medicare Other | Admitting: *Deleted

## 2020-12-10 DIAGNOSIS — Z0000 Encounter for general adult medical examination without abnormal findings: Secondary | ICD-10-CM | POA: Diagnosis not present

## 2020-12-10 NOTE — Progress Notes (Signed)
MEDICARE ANNUAL WELLNESS VISIT  12/10/2020  Telephone Visit Disclaimer This Medicare AWV was conducted by telephone due to national recommendations for restrictions regarding the COVID-19 Pandemic (e.g. social distancing).  I verified, using two identifiers, that I am speaking with [name] or their authorized healthcare agent. I discussed the limitations, risks, security, and privacy concerns of performing an evaluation and management service by telephone and the potential availability of an in-person appointment in the future. The patient expressed understanding and agreed to proceed.  Location of Patient: Home Location of Provider (nurse):  [facility]  Subjective:    [name] is a 87 y.o. male patient of [name], Fransisca Kaufmann, MD who had a Medicare Annual Wellness Visit today via telephone. [name] is Retired and lives with their spouse. he has 1 daughter. he reports that he is socially active and [name] interact with friends/family regularly. he is not physically active and enjoys working in his shop, fishing, bowling, and golfing.  Patient Care Team: [name], Fransisca Kaufmann, MD as PCP - General (Family Medicine) Minus Breeding, MD as PCP - Cardiology (Cardiology) Irine Seal, MD as Attending Physician (Urology) Minus Breeding, MD as Consulting Physician (Cardiology) Rogene Houston, MD as Consulting Physician (Gastroenterology) Tonny Branch, MD as Consulting Physician (Ophthalmology) [name], MD as Consulting Physician (Orthopedic Surgery) Shea Evans Norva Riffle, LCSW as [place] Management (Licensed Clinical Social Worker)  Advanced Directives 12/10/2020 11/21/2019 08/18/2019 05/04/2018 05/04/2018 04/25/2018 08/13/2016  [name] Patient Have a Medical Advance Directive? No No No No No No No  Type of Advance Directive - - - - - - -  Copy of Healthcare Power of Attorney in Chart? - - - - - - -  Would patient like information on creating a  medical advance directive? No - Patient declined No - Patient declined No - Patient declined No - Patient declined No - Patient declined No - Patient declined No - patient declined information    Hospital Utilization Over the Past 12 Months: # of hospitalizations or ER visits: 0 # of surgeries: 0  Review of Systems    Patient reports that his overall health is unchanged compared to last year.  History obtained from chart review and patient   Patient Reported Readings (BP, Pulse, CBG, Weight, etc) none  Pain Assessment Pain : No/denies pain     Current Medications & Allergies (verified) Allergies as of 12/10/2020      Reactions   Penicillins Hives, Rash   Has patient had a PCN reaction causing immediate rash, facial/tongue/throat swelling, SOB or lightheadedness with hypotension: Yes Has patient had a PCN reaction causing severe rash involving mucus membranes or skin necrosis: Yes Has patient had a PCN reaction that required hospitalization: No Has patient had a PCN reaction occurring within the last 10 years: No If all of the above answers are "NO", then may proceed with Cephalosporin use.   Hct [hydrochlorothiazide] Other (See Comments)   hyper   Statins Other (See Comments)   Myalgia, tolerates low dosages of lovastatin       Medication List       Accurate as of December 10, 2020  4:08 PM. If you have any questions, ask your nurse or doctor.        acetaminophen 500 MG tablet Commonly known as: TYLENOL Take 1,000 mg by mouth every 6 (six) hours as needed for moderate pain.   diltiazem 120 MG 24 hr capsule Commonly known as: CARDIZEM CD  TAKE ONE (1) CAPSULE EACH DAY   ezetimibe 10 MG tablet Commonly known as: ZETIA TAKE ONE (1) TABLET EACH DAY   finasteride 5 MG tablet Commonly known as: PROSCAR TAKE ONE (1) TABLET EACH DAY   fluticasone 50 MCG/ACT nasal spray Commonly known as: FLONASE Place 1 spray into both nostrils daily as needed for allergies or  rhinitis.   furosemide 20 MG tablet Commonly known as: LASIX TAKE ONE (1) TABLET EACH DAY   lovastatin 20 MG tablet Commonly known as: MEVACOR Take 0.5 tablets (10 mg total) by mouth every other day.   metoprolol 200 MG 24 hr tablet Commonly known as: TOPROL-XL TAKE ONE (1) TABLET EACH DAY   SYSTANE OP Place 1 drop into both eyes 3 (three) times daily as needed (dry eyes).   Vitamin D3 125 MCG (5000 UT) Caps Take 5,000 Units by mouth once a week.   warfarin 5 MG tablet Commonly known as: COUMADIN Take as directed by the anticoagulation clinic. If you are unsure how to take this medication, talk to your nurse or doctor. Original instructions: TAKE 1 TABLET ON SAT, SUN, TUES, & THURSTAKE 1/2 TABLET ON MON, WED, & FRIDAY       History (reviewed): Past Medical History:  Diagnosis Date  . Anemia   . Atrial fibrillation ([place])   . Cataract   . Cellulitis    In past  . Complication of anesthesia    HARD TIME WAKING; CAUSES MY BP TO GO UP   . Coronary artery disease    5 bypasses  . DJD (degenerative joint disease)   . Ejection fraction < 50%    Mildly reduced, 40% by echo  . GERD (gastroesophageal reflux disease)   . Hyperlipidemia   . Hypertension   . Persistent atrial fibrillation ([place])   . Prostate hypertrophy    on CT scan 09/2014  . PUD (peptic ulcer disease)    RESOLVED   . Skin cancer of eyelid    Resected   Past Surgical History:  Procedure Laterality Date  . APPENDECTOMY    . BYPASS GRAFT  2005  . CATARACT EXTRACTION W/PHACO Left 07/22/2015   Procedure: CATARACT EXTRACTION PHACO AND INTRAOCULAR LENS PLACEMENT LEFT EYE CDE=8.14;  Surgeon: Tonny Branch, MD;  Location: [facility];  Service: Ophthalmology;  Laterality: Left;  . CATARACT EXTRACTION W/PHACO Right 08/18/2019   Procedure: CATARACT EXTRACTION PHACO AND INTRAOCULAR LENS PLACEMENT (IOC);  Surgeon: Baruch Goldmann, MD;  Location: [facility];  Service: Ophthalmology;  Laterality: Right;  CDE: 9.74  . CORONARY  ARTERY BYPASS GRAFT  4/05   LIMA to LAD, SVG to PDA, SVG to ramus intermediate  . EYE SURGERY  07/2015  . EYELID CARCINOMA EXCISION     Skin cancer resected  . Heart bypass  2005  . TOTAL HIP ARTHROPLASTY     Right  . TOTAL HIP ARTHROPLASTY Left 05/04/2018   Procedure: LEFT TOTAL HIP ARTHROPLASTY;  Surgeon: [name], MD;  Location: [facility];  Service: Orthopedics;  Laterality: Left;  . TOTAL KNEE ARTHROPLASTY     Right   Family History  Problem Relation Age of Onset  . Stroke Mother   . Stroke Father   . Hypertension Father   . Early death Brother        102 months old  . Cancer Brother        lung  . Stroke Brother        heat   Social History   Socioeconomic History  .  Marital status: Married    Spouse name: [name]  . Number of children: 1  . Years of education: 52  . Highest education level: 10th grade  Occupational History  . Occupation: retired  Tobacco Use  . Smoking status: Former Smoker    Packs/day: 2.00    Years: 5.00    Pack years: 10.00    Types: Cigarettes    Quit date: 12/23/1963    Years since quitting: 57.0  . Smokeless tobacco: Never Used  Vaping Use  . Vaping Use: Never used  Substance and Sexual Activity  . Alcohol use: No  . Drug use: No  . Sexual activity: Yes  Other Topics Concern  . Not on file  Social History Narrative   Lives in a [place].   Social Determinants of Health   Financial Resource Strain: Not on file  Food Insecurity: Not on file  Transportation Needs: Not on file  Physical Activity: Not on file  Stress: Not on file  Social Connections: Not on file    Activities of Daily Living In your present state of health, do you have any difficulty performing the following activities: 12/10/2020 11/27/2020  Hearing? N N  Vision? N N  Comment Wears glasses -  Difficulty concentrating or making decisions? N N  Walking or climbing stairs? N N  Dressing or bathing? N N  Doing errands, shopping? N N  Preparing Food  and eating ? N -  Using the Toilet? N -  In the past six months, have you accidently leaked urine? N -  Do you have problems with loss of bowel control? N -  Managing your Medications? N -  Managing your Finances? N -  Housekeeping or managing your Housekeeping? N -  Some recent data might be hidden    Patient Education/ Literacy How often do you need to have someone help you when you read instructions, pamphlets, or other written materials from your doctor or pharmacy?: 1 - Never What is the last grade level you completed in school?: 10th  Exercise Current Exercise Habits: The patient [name] not participate in regular exercise at present  Diet Patient reports consuming 2 meals a day and 1 snack(s) a day Patient reports that his primary diet is: Regular Patient reports that she [name] have regular access to food.   Depression Screen PHQ 2/9 Scores 12/10/2020 11/27/2020 09/27/2020 08/12/2020 06/07/2020 04/12/2020 03/04/2020  PHQ - 2 Score 0 0 0 0 0 0 2  PHQ- 9 Score - - - - - - 4     Fall Risk Fall Risk  12/10/2020 11/27/2020 09/27/2020 08/12/2020 06/07/2020  Falls in the past year? 0 0 0 0 0     Objective:  [name] seemed alert and oriented and he participated appropriately during our telephone visit.  Blood Pressure Weight BMI  BP Readings from Last 3 Encounters:  11/27/20 (!) 151/85  09/27/20 (!) 142/78  08/12/20 (!) 168/94   Wt Readings from Last 3 Encounters:  11/27/20 189 lb (85.7 kg)  09/27/20 189 lb (85.7 kg)  08/12/20 184 lb 9.6 oz (83.7 kg)   BMI Readings from Last 1 Encounters:  11/27/20 29.60 kg/m    *Unable to obtain current vital signs, weight, and BMI due to telephone visit type  Hearing/Vision  . Macen did not seem to have difficulty with hearing/understanding during the telephone conversation . Reports that he has had a formal eye exam by an eye care professional within the past year . Reports  that he has not had a formal hearing evaluation within the past  year *Unable to fully assess hearing and vision during telephone visit type  Cognitive Function: 6CIT Screen 12/10/2020 11/21/2019  What Year? 0 points 0 points  What month? 0 points 0 points  What time? 0 points 0 points  Count back from 20 0 points 0 points  Months in reverse 2 points 0 points  Repeat phrase 2 points 4 points  Total Score 4 4   (Normal:0-7, Significant for Dysfunction: >8)  Normal Cognitive Function Screening: Yes   Immunization & Health Maintenance Record Immunization History  Administered Date(s) Administered  . Fluad Quad(high Dose 65+) 07/19/2019, 07/19/2020  . Influenza, High Dose Seasonal PF 07/07/2016, 07/21/2017, 07/08/2018  . Influenza,inj,Quad PF,6+ Mos 07/17/2013, 07/05/2014, 07/17/2015  . Moderna Sars-Covid-2 Vaccination 12/04/2019, 01/03/2020, 08/29/2020  . Pneumococcal Conjugate-13 10/30/2013  . Pneumococcal Polysaccharide-23 05/12/2010  . Tdap 10/13/2011    Health Maintenance  Topic Date Due  . TETANUS/TDAP  10/12/2021  . INFLUENZA VACCINE  Completed  . COVID-19 Vaccine  Completed  . PNA vac Low Risk Adult  Completed  . HPV VACCINES  Aged Out       Assessment  This is a routine wellness examination for [employer].  Health Maintenance: Due or Overdue There are no preventive care reminders to display for this patient.  [name] not need a referral for Community Assistance: Patient is already enrolled with [facility].  Care Management:   no Social Work:    no Prescription Assistance:  no Nutrition/Diabetes Education:  no   Plan:  Personalized Goals Goals Addressed   None    Personalized Health Maintenance & Screening Recommendations  Patient is up to date  Lung Cancer Screening Recommended: no (Low Dose CT Chest recommended if Age 70-80 years, 30 pack-year currently smoking OR have quit w/in past 15 years) Hepatitis C Screening recommended: no HIV Screening recommended: no  Advanced Directives: Written  information was not prepared per patient's request.  Referrals & Orders No orders of the defined types were placed in this encounter.   Follow-up Plan . Follow-up with [name], Fransisca Kaufmann, MD as planned . AVS printed and mailed to patient    I have personally reviewed and noted the following in the patient's chart:   . Medical and social history . Use of alcohol, tobacco or illicit drugs  . Current medications and supplements . Functional ability and status . Nutritional status . Physical activity . Advanced directives . List of other physicians . Hospitalizations, surgeries, and ER visits in previous 12 months . Vitals . Screenings to include cognitive, depression, and falls . Referrals and appointments  In addition, I have reviewed and discussed with [name] certain preventive protocols, quality metrics, and best practice recommendations. A written personalized care plan for preventive services as well as general preventive health recommendations is available and can be mailed to the patient at his request.      Greg Ferrier, LPN  06/15/8545

## 2021-01-01 ENCOUNTER — Telehealth: Payer: Medicare Other

## 2021-01-06 ENCOUNTER — Other Ambulatory Visit: Payer: Self-pay | Admitting: Family Medicine

## 2021-01-20 ENCOUNTER — Other Ambulatory Visit: Payer: Self-pay

## 2021-01-20 ENCOUNTER — Ambulatory Visit (INDEPENDENT_AMBULATORY_CARE_PROVIDER_SITE_OTHER): Payer: Medicare Other | Admitting: Family Medicine

## 2021-01-20 ENCOUNTER — Encounter: Payer: Self-pay | Admitting: Family Medicine

## 2021-01-20 VITALS — BP 133/72 | HR 71 | Ht 67.0 in | Wt 192.0 lb

## 2021-01-20 DIAGNOSIS — Z7901 Long term (current) use of anticoagulants: Secondary | ICD-10-CM | POA: Diagnosis not present

## 2021-01-20 DIAGNOSIS — I4819 Other persistent atrial fibrillation: Secondary | ICD-10-CM | POA: Diagnosis not present

## 2021-01-20 LAB — COAGUCHEK XS/INR WAIVED
INR: 1.9 — ABNORMAL HIGH (ref 0.9–1.1)
Prothrombin Time: 22.6 s

## 2021-01-20 NOTE — Progress Notes (Signed)
BP 133/72   Pulse 71   Ht 5\' 7"  (1.702 m)   Wt 192 lb (87.1 kg)   SpO2 97%   BMI 30.07 kg/m    Subjective:   Patient ID: [name], male    DOB: [birth year], 87 y.o.   MRN: [id number]  HPI: [name] is a 87 y.o. male presenting on 01/20/2021 for Medical Management of Chronic Issues and Atrial Fibrillation   HPI Coumadin recheck Target goal: 2.0-3.0 Reason on anticoagulation: Chronic A. fib Patient denies any bruising or bleeding or chest pain or palpitations   Relevant past medical, surgical, family and social history reviewed and updated as indicated. Interim medical history since our last visit reviewed. Allergies and medications reviewed and updated.  Review of Systems  Constitutional: Negative for chills and fever.  Respiratory: Negative for shortness of breath and wheezing.   Cardiovascular: Negative for chest pain and leg swelling.  Gastrointestinal: Negative for anal bleeding and blood in stool.  Genitourinary: Negative for hematuria.  Skin: Negative for rash.  All other systems reviewed and are negative.   Per HPI unless specifically indicated above   Allergies as of 01/20/2021      Reactions   Penicillins Hives, Rash   Has patient had a PCN reaction causing immediate rash, facial/tongue/throat swelling, SOB or lightheadedness with hypotension: Yes Has patient had a PCN reaction causing severe rash involving mucus membranes or skin necrosis: Yes Has patient had a PCN reaction that required hospitalization: No Has patient had a PCN reaction occurring within the last 10 years: No If all of the above answers are "NO", then may proceed with Cephalosporin use.   Hct [hydrochlorothiazide] Other (See Comments)   hyper   Statins Other (See Comments)   Myalgia, tolerates low dosages of lovastatin       Medication List       Accurate as of January 20, 2021  9:03 AM. If you have any questions, ask your nurse or doctor.        acetaminophen 500 MG  tablet Commonly known as: TYLENOL Take 1,000 mg by mouth every 6 (six) hours as needed for moderate pain.   diltiazem 120 MG 24 hr capsule Commonly known as: CARDIZEM CD TAKE ONE (1) CAPSULE EACH DAY   ezetimibe 10 MG tablet Commonly known as: ZETIA TAKE ONE (1) TABLET EACH DAY   finasteride 5 MG tablet Commonly known as: PROSCAR TAKE ONE (1) TABLET EACH DAY   fluticasone 50 MCG/ACT nasal spray Commonly known as: FLONASE Place 1 spray into both nostrils daily as needed for allergies or rhinitis.   furosemide 20 MG tablet Commonly known as: LASIX TAKE ONE (1) TABLET EACH DAY   lovastatin 20 MG tablet Commonly known as: MEVACOR Take 0.5 tablets (10 mg total) by mouth every other day.   metoprolol 200 MG 24 hr tablet Commonly known as: TOPROL-XL TAKE ONE (1) TABLET EACH DAY   SYSTANE OP Place 1 drop into both eyes 3 (three) times daily as needed (dry eyes).   Vitamin D3 125 MCG (5000 UT) Caps Take 5,000 Units by mouth once a week.   warfarin 5 MG tablet Commonly known as: COUMADIN Take as directed by the anticoagulation clinic. If you are unsure how to take this medication, talk to your nurse or doctor. Original instructions: TAKE 1 TABLET ON SAT, SUN, TUES, & THURSTAKE 1/2 TABLET ON MON, WED, & FRIDAY        Objective:   BP 133/72  Pulse 71   Ht 5\' 7"  (1.702 m)   Wt 192 lb (87.1 kg)   SpO2 97%   BMI 30.07 kg/m   Wt Readings from Last 3 Encounters:  01/20/21 192 lb (87.1 kg)  11/27/20 189 lb (85.7 kg)  09/27/20 189 lb (85.7 kg)    Physical Exam Vitals and nursing note reviewed.  Constitutional:      General: He is not in acute distress.    Appearance: He is well-developed. He is not diaphoretic.  Eyes:     General: No scleral icterus.    Conjunctiva/sclera: Conjunctivae normal.  Neck:     Thyroid: No thyromegaly.  Cardiovascular:     Rate and Rhythm: Normal rate. Rhythm irregular.     Heart sounds: Normal heart sounds. No murmur  heard.   Pulmonary:     Effort: Pulmonary effort is normal. No respiratory distress.     Breath sounds: Normal breath sounds. No wheezing.  Musculoskeletal:     Cervical back: Neck supple.  Lymphadenopathy:     Cervical: No cervical adenopathy.  Skin:    General: Skin is warm and dry.     Findings: No rash.  Neurological:     Mental Status: He is alert and oriented to person, place, and time.     Coordination: Coordination normal.  Psychiatric:        Behavior: Behavior normal.       Assessment & Plan:   Problem List Items Addressed This Visit      Cardiovascular and Mediastinum   Atrial fibrillation, persistent ([place]) - Primary   Relevant Orders   CoaguChek XS/INR Waived     Other   Long term current use of anticoagulant therapy      Description   Take an extra half a tablet today then he normally would and then continue weekly dose by taking 1 tablet every day except for Mondays and Thursdays take 1/2 tablet  INR today is  1.9 (goal is 2-3)    Recheck in 6-8 weeks     Follow up plan: Return if symptoms worsen or fail to improve, for 6 to 8-week INR recheck.  Counseling provided for all of the vaccine components Orders Placed This Encounter  Procedures  . CoaguChek XS/INR [place], MD [facility] 01/20/2021, 9:03 AM

## 2021-01-30 DIAGNOSIS — Z23 Encounter for immunization: Secondary | ICD-10-CM | POA: Diagnosis not present

## 2021-02-04 ENCOUNTER — Telehealth: Payer: Medicare Other

## 2021-02-05 ENCOUNTER — Other Ambulatory Visit: Payer: Self-pay | Admitting: Family Medicine

## 2021-02-10 ENCOUNTER — Ambulatory Visit: Payer: Medicare Other | Admitting: Family Medicine

## 2021-02-26 ENCOUNTER — Other Ambulatory Visit: Payer: Self-pay | Admitting: Family Medicine

## 2021-03-13 ENCOUNTER — Ambulatory Visit (INDEPENDENT_AMBULATORY_CARE_PROVIDER_SITE_OTHER): Payer: Medicare Other | Admitting: Licensed Clinical Social Worker

## 2021-03-13 DIAGNOSIS — N400 Enlarged prostate without lower urinary tract symptoms: Secondary | ICD-10-CM

## 2021-03-13 DIAGNOSIS — R972 Elevated prostate specific antigen [PSA]: Secondary | ICD-10-CM | POA: Diagnosis not present

## 2021-03-13 DIAGNOSIS — I10 Essential (primary) hypertension: Secondary | ICD-10-CM | POA: Diagnosis not present

## 2021-03-13 DIAGNOSIS — I509 Heart failure, unspecified: Secondary | ICD-10-CM | POA: Diagnosis not present

## 2021-03-13 DIAGNOSIS — I4819 Other persistent atrial fibrillation: Secondary | ICD-10-CM

## 2021-03-13 DIAGNOSIS — M159 Polyosteoarthritis, unspecified: Secondary | ICD-10-CM

## 2021-03-13 DIAGNOSIS — K219 Gastro-esophageal reflux disease without esophagitis: Secondary | ICD-10-CM

## 2021-03-13 DIAGNOSIS — I2510 Atherosclerotic heart disease of native coronary artery without angina pectoris: Secondary | ICD-10-CM | POA: Diagnosis not present

## 2021-03-13 DIAGNOSIS — M8949 Other hypertrophic osteoarthropathy, multiple sites: Secondary | ICD-10-CM

## 2021-03-13 NOTE — Chronic Care Management (AMB) (Signed)
Chronic Care Management    Clinical Social Work Note  03/13/2021 Name: [name] MRN: [id number] DOB: Jan 24, 1932  [name] is a 87 y.o. year old male who is a primary care patient of [name], Fransisca Kaufmann, MD. The CCM team was consulted to assist the patient with chronic disease management and/or care coordination needs related to: [name] .   Engaged with patient/ spouse of patient, [name],  by telephone for follow up visit in response to provider referral for social work chronic care management and care coordination services.   Consent to Services:  The patient was given information about Chronic Care Management services, agreed to services, and gave verbal consent prior to initiation of services.  Please see initial visit note for detailed documentation.   Patient agreed to services and consent obtained.   Assessment: Review of patient past medical history, allergies, medications, and health status, including review of relevant consultants reports was performed today as part of a comprehensive evaluation and provision of chronic care management and care coordination services.     SDOH (Social Determinants of Health) assessments and interventions performed:  SDOH Interventions   Flowsheet Row Most Recent Value  SDOH Interventions   Depression Interventions/Treatment  --  [informed client and spouse of LCSW support and or [name] support]       Advanced Directives Status: See Vynca application for related entries.  CCM Care Plan  Allergies  Allergen Reactions  . Penicillins Hives and Rash    Has patient had a PCN reaction causing immediate rash, facial/tongue/throat swelling, SOB or lightheadedness with hypotension: Yes Has patient had a PCN reaction causing severe rash involving mucus membranes or skin necrosis: Yes Has patient had a PCN reaction that required hospitalization: No Has patient had a PCN reaction occurring within the last 10 years: No If all of  the above answers are "NO", then may proceed with Cephalosporin use.   . Hct [Hydrochlorothiazide] Other (See Comments)    hyper  . Statins Other (See Comments)    Myalgia, tolerates low dosages of lovastatin     Outpatient Encounter Medications as of 03/13/2021  Medication Sig  . acetaminophen (TYLENOL) 500 MG tablet Take 1,000 mg by mouth every 6 (six) hours as needed for moderate pain.  . Cholecalciferol (VITAMIN D3) 5000 units CAPS Take 5,000 Units by mouth once a week.   . diltiazem (CARDIZEM CD) 120 MG 24 hr capsule TAKE ONE (1) CAPSULE EACH DAY  . ezetimibe (ZETIA) 10 MG tablet TAKE ONE (1) TABLET EACH DAY  . finasteride (PROSCAR) 5 MG tablet TAKE ONE (1) TABLET EACH DAY  . fluticasone (FLONASE) 50 MCG/ACT nasal spray Place 1 spray into both nostrils daily as needed for allergies or rhinitis.  . furosemide (LASIX) 20 MG tablet TAKE ONE (1) TABLET EACH DAY  . lovastatin (MEVACOR) 20 MG tablet Take 0.5 tablets (10 mg total) by mouth every other day.  . metoprolol (TOPROL-XL) 200 MG 24 hr tablet TAKE ONE (1) TABLET EACH DAY  . Polyethyl Glycol-Propyl Glycol (SYSTANE OP) Place 1 drop into both eyes 3 (three) times daily as needed (dry eyes).   . warfarin (COUMADIN) 5 MG tablet TAKE 1 TABLET ON SAT, SUN, TUES, & THURSTAKE 1/2 TABLET ON MON, WED, & FRIDAY   No facility-administered encounter medications on file as of 03/13/2021.    Patient Active Problem List   Diagnosis Date Noted  . Ischemic cardiomyopathy 09/19/2019  . Prediabetes 08/25/2019  . CHF (congestive heart failure) ([place])   .  H/O total hip arthroplasty, left 05/04/2018  . Dyslipidemia 12/16/2017  . Stenosis of left carotid artery 12/16/2017  . Peripheral vascular insufficiency ([place]) 11/05/2017  . Primary osteoarthritis involving multiple joints 11/05/2017  . Vitamin D deficiency 11/05/2017  . Primary osteoarthritis of left knee 11/05/2017  . Left-sided carotid artery disease ([place]) 06/02/2017  . Coronary artery disease  involving native coronary artery of native heart without angina pectoris 06/02/2017  . Metabolic syndrome 67/61/9509  . [name] with elevated PSA 01/21/2015  . Cataract of both eyes 05/25/2014  . Long term current use of anticoagulant therapy 02/14/2013  . Overweight 12/11/2009  . Essential hypertension 02/25/2009  . Coronary atherosclerosis 02/25/2009  . Atrial fibrillation, persistent ([place]) 02/25/2009  . GERD 02/25/2009  . Osteoarthritis 02/25/2009    Conditions to be addressed/monitored: Monitor [name], as he is able   Care Plan : LCSW care plan  Updates made by Katha Cabal, LCSW since 03/13/2021 12:00 AM    Problem: Coping Skills (General Plan of Care)     Goal: Coping Skills Enhanced;Complete ADLs daily , as able   Start Date: 03/13/2021  Expected End Date: 06/10/2021  This Visit's Progress: On track  Priority: Medium  Note:   Current barriers:   . Patient in need of assistance with connecting to community resources for possible help in [name] . Patient is unable to independently navigate community resource options without care coordination support . Mobility issues  Clinical Goals:  patient will work with SW in next 30 days to address concerns related to ADLs completion for client  Patient will communicate in next 30 days with [name] or LCSW as needed for CCM support  Clinical Interventions:  . Collaboration with [name], Fransisca Kaufmann, MD regarding development and update of comprehensive plan of care as evidenced by provider attestation and co-signature . Assessment of needs, barriers of client  . Talked with [name], spouse of client ,about walking challenges of client  . Talked with [name] about pain issues of client . Talked with [name] about appetite of client . Talked with [name] about sleeping issues of client  . Talked with [name] about needs of client . Talked with [name] about vision needs of client  (client has glasses to wear) . Talked with [name] about upcoming medical appointments for client . Talked with [name] about client support with cardiologist . Talked with [name] about client history of heart issues (she said he had bypass surgery for his heart in 2005) . Talked with [name] about client occasional shortness of breath . Talked with [name] about client relaxation techniques (likes to work in his garden,likes to [employer] yard and do outdoor activities) . Talked with [name] about family support (daughter is supportive) . Encouraged [name] or client to call [name] as needed for nursing support for client  Patient Coping Skills/Strengths: Has support from his spouse, Ole Lafon Does some ADLs on his own Attends scheduled medical appointments  Patient Deficits: Needs  some help with completing certain ADLs Some mobility challenges  Patient Goals:  Attend scheduled medical appointments in next 30 days Talk regularly with spouse, in next 30 days, about client needs Take medications as prescribed in next 30 days Call [facility] as needed in next 30 days for nursing support for client  Follow Up Plan: LCSW to call client on 04/25/21     Norva Riffle.Clemence Lengyel MSW, LCSW Licensed Clinical Social Worker [facility] Care Management [phone]

## 2021-03-13 NOTE — Patient Instructions (Signed)
Visit Information  PATIENT GOALS: Goals Addressed            This Visit's Progress   . Protect My Health;Complete ADLs daily, as able       Timeframe:  Short-Term Goal Priority:  Medium Progress: On Track Start Date:             03/13/21                Expected End Date:           06/10/21            Follow Up Date 04/25/21   Protect My Health (Patient) Complete ADLs daily, as able   Why is this important?    Screening tests can find diseases early when they are easier to treat.   Your doctor or nurse will talk with you about which tests are important for you.   Getting shots for common diseases like the flu and shingles will help prevent them.     Patient Coping Skills/Strengths: Has support from his spouse, Sabastien [name] Does some ADLs on his own Attends scheduled medical appointments  Patient Deficits: Needs  some help with completing certain ADLs Some mobility challenges  Patient Goals:  Attend scheduled medical appointments in next 30 days Talk regularly with spouse, in next 30 days, about client needs Take medications as prescribed in next 30 days Call [facility] as needed in next 30 days for nursing support for client  Follow Up Plan: LCSW to call client on 04/25/21       [name].[name] MSW, LCSW Licensed Clinical Social Worker [facility] Care Management [phone]

## 2021-03-31 ENCOUNTER — Other Ambulatory Visit: Payer: Self-pay

## 2021-03-31 ENCOUNTER — Ambulatory Visit (INDEPENDENT_AMBULATORY_CARE_PROVIDER_SITE_OTHER): Payer: Medicare Other | Admitting: Family Medicine

## 2021-03-31 ENCOUNTER — Encounter: Payer: Self-pay | Admitting: Family Medicine

## 2021-03-31 VITALS — BP 179/94 | HR 73 | Ht 67.0 in | Wt 187.0 lb

## 2021-03-31 DIAGNOSIS — I10 Essential (primary) hypertension: Secondary | ICD-10-CM | POA: Diagnosis not present

## 2021-03-31 DIAGNOSIS — I4819 Other persistent atrial fibrillation: Secondary | ICD-10-CM | POA: Diagnosis not present

## 2021-03-31 DIAGNOSIS — Z7901 Long term (current) use of anticoagulants: Secondary | ICD-10-CM | POA: Diagnosis not present

## 2021-03-31 DIAGNOSIS — E785 Hyperlipidemia, unspecified: Secondary | ICD-10-CM | POA: Diagnosis not present

## 2021-03-31 LAB — COAGUCHEK XS/INR WAIVED
INR: 2.3 — ABNORMAL HIGH (ref 0.9–1.1)
Prothrombin Time: 27.5 s

## 2021-03-31 NOTE — Progress Notes (Signed)
BP (!) 179/94   Pulse 73   Ht 5\' 7"  (1.702 m)   Wt 187 lb (84.8 kg)   SpO2 98%   BMI 29.29 kg/m    Subjective:   Patient ID: [name], male    DOB: 1931/12/15, 87 y.o.   MRN: [id number]  HPI: [name] is a 87 y.o. male presenting on 03/31/2021 for Medical Management of Chronic Issues and Atrial Fibrillation   HPI Coumadin recheck Target goal: 2.0-3.0 Reason on anticoagulation: chronic afib Patient denies any bruising or bleeding or chest pain or palpitations   Relevant past medical, surgical, family and social history reviewed and updated as indicated. Interim medical history since our last visit reviewed. Allergies and medications reviewed and updated.  Review of Systems  Constitutional:  Negative for chills and fever.  Respiratory:  Negative for shortness of breath and wheezing.   Cardiovascular:  Negative for chest pain and leg swelling.  Musculoskeletal:  Negative for back pain and gait problem.  Skin:  Negative for rash.  All other systems reviewed and are negative.  Per HPI unless specifically indicated above   Allergies as of 03/31/2021       Reactions   Penicillins Hives, Rash   Has patient had a PCN reaction causing immediate rash, facial/tongue/throat swelling, SOB or lightheadedness with hypotension: Yes Has patient had a PCN reaction causing severe rash involving mucus membranes or skin necrosis: Yes Has patient had a PCN reaction that required hospitalization: No Has patient had a PCN reaction occurring within the last 10 years: No If all of the above answers are "NO", then may proceed with Cephalosporin use.   Hct [hydrochlorothiazide] Other (See Comments)   hyper   Statins Other (See Comments)   Myalgia, tolerates low dosages of lovastatin         Medication List        Accurate as of March 31, 2021 11:15 AM. If you have any questions, ask your nurse or doctor.          acetaminophen 500 MG tablet Commonly known as: TYLENOL Take  1,000 mg by mouth every 6 (six) hours as needed for moderate pain.   diltiazem 120 MG 24 hr capsule Commonly known as: CARDIZEM CD TAKE ONE (1) CAPSULE EACH DAY   ezetimibe 10 MG tablet Commonly known as: ZETIA TAKE ONE (1) TABLET EACH DAY   finasteride 5 MG tablet Commonly known as: PROSCAR TAKE ONE (1) TABLET EACH DAY   fluticasone 50 MCG/ACT nasal spray Commonly known as: FLONASE Place 1 spray into both nostrils daily as needed for allergies or rhinitis.   furosemide 20 MG tablet Commonly known as: LASIX TAKE ONE (1) TABLET EACH DAY   lovastatin 20 MG tablet Commonly known as: MEVACOR Take 0.5 tablets (10 mg total) by mouth every other day.   metoprolol 200 MG 24 hr tablet Commonly known as: TOPROL-XL TAKE ONE (1) TABLET EACH DAY   SYSTANE OP Place 1 drop into both eyes 3 (three) times daily as needed (dry eyes).   Vitamin D3 125 MCG (5000 UT) Caps Take 5,000 Units by mouth once a week.   warfarin 5 MG tablet Commonly known as: COUMADIN Take as directed by the anticoagulation clinic. If you are unsure how to take this medication, talk to your nurse or doctor. Original instructions: TAKE 1 TABLET ON SAT, SUN, TUES, & THURSTAKE 1/2 TABLET ON MON, WED, & FRIDAY         Objective:  BP (!) 179/94   Pulse 73   Ht 5\' 7"  (1.702 m)   Wt 187 lb (84.8 kg)   SpO2 98%   BMI 29.29 kg/m   Wt Readings from Last 3 Encounters:  03/31/21 187 lb (84.8 kg)  01/20/21 192 lb (87.1 kg)  11/27/20 189 lb (85.7 kg)    Physical Exam Vitals and nursing note reviewed.  Constitutional:      General: He is not in acute distress.    Appearance: He is well-developed. He is not diaphoretic.  Eyes:     General: No scleral icterus.    Conjunctiva/sclera: Conjunctivae normal.  Neck:     Thyroid: No thyromegaly.  Cardiovascular:     Rate and Rhythm: Normal rate. Rhythm irregular.     Heart sounds: Normal heart sounds. No murmur heard. Pulmonary:     Effort: Pulmonary effort is  normal. No respiratory distress.     Breath sounds: Normal breath sounds. No wheezing.  Musculoskeletal:        General: Normal range of motion.     Cervical back: Neck supple.  Lymphadenopathy:     Cervical: No cervical adenopathy.  Skin:    General: Skin is warm and dry.     Findings: No rash.  Neurological:     Mental Status: He is alert and oriented to person, place, and time.     Coordination: Coordination normal.  Psychiatric:        Behavior: Behavior normal.    Description   Continue weekly dose by taking 1 tablet every day except for Mondays and Thursdays take 1/2 tablet  INR today is  2.6 (goal is 2-3)    Recheck in 6-8 weeks      Assessment & Plan:   Problem List Items Addressed This Visit       Cardiovascular and Mediastinum   Essential hypertension - Primary   Relevant Orders   CoaguChek XS/INR Waived (Completed)   Atrial fibrillation, persistent ([place])   Relevant Orders   CoaguChek XS/INR Waived (Completed)     Other   Dyslipidemia   Relevant Orders   CoaguChek XS/INR Waived (Completed)   Long term current use of anticoagulant therapy     Follow up plan: Return if symptoms worsen or fail to improve, for 6 to 8-week INR recheck.  Counseling provided for all of the vaccine components Orders Placed This Encounter  Procedures   CoaguChek XS/INR Heron Jashawn Floyd, MD [facility] 03/31/2021, 11:15 AM

## 2021-04-12 ENCOUNTER — Other Ambulatory Visit: Payer: Self-pay | Admitting: Family Medicine

## 2021-04-25 ENCOUNTER — Ambulatory Visit (INDEPENDENT_AMBULATORY_CARE_PROVIDER_SITE_OTHER): Payer: Medicare Other | Admitting: Licensed Clinical Social Worker

## 2021-04-25 DIAGNOSIS — M159 Polyosteoarthritis, unspecified: Secondary | ICD-10-CM

## 2021-04-25 DIAGNOSIS — I2510 Atherosclerotic heart disease of native coronary artery without angina pectoris: Secondary | ICD-10-CM | POA: Diagnosis not present

## 2021-04-25 DIAGNOSIS — I509 Heart failure, unspecified: Secondary | ICD-10-CM | POA: Diagnosis not present

## 2021-04-25 DIAGNOSIS — I4819 Other persistent atrial fibrillation: Secondary | ICD-10-CM

## 2021-04-25 DIAGNOSIS — I10 Essential (primary) hypertension: Secondary | ICD-10-CM | POA: Diagnosis not present

## 2021-04-25 DIAGNOSIS — R972 Elevated prostate specific antigen [PSA]: Secondary | ICD-10-CM

## 2021-04-25 DIAGNOSIS — M8949 Other hypertrophic osteoarthropathy, multiple sites: Secondary | ICD-10-CM

## 2021-04-25 DIAGNOSIS — N400 Enlarged prostate without lower urinary tract symptoms: Secondary | ICD-10-CM

## 2021-04-25 DIAGNOSIS — K219 Gastro-esophageal reflux disease without esophagitis: Secondary | ICD-10-CM

## 2021-04-25 NOTE — Chronic Care Management (AMB) (Signed)
Chronic Care Management    Clinical Social Work Note  04/25/2021 Name: [name] MRN: [id number] DOB: 01-Feb-1932  [name] is a 87 y.o. year old male who is a primary care patient of [name], Fransisca Kaufmann, MD. The CCM team was consulted to assist the patient with chronic disease management and/or care coordination needs related to: [name] .   Engaged with patient by telephone for follow up visit in response to provider referral for social work chronic care management and care coordination services.   Consent to Services:  The patient was given information about Chronic Care Management services, agreed to services, and gave verbal consent prior to initiation of services.  Please see initial visit note for detailed documentation.   Patient agreed to services and consent obtained.   Assessment: Review of patient past medical history, allergies, medications, and health status, including review of relevant consultants reports was performed today as part of a comprehensive evaluation and provision of chronic care management and care coordination services.     SDOH (Social Determinants of Health) assessments and interventions performed:  SDOH Interventions    Flowsheet Row Most Recent Value  SDOH Interventions   Depression Interventions/Treatment  --  [informed client and his spouse of LCSW support and of [name] support]        Advanced Directives Status: See Vynca application for related entries.  CCM Care Plan  Allergies  Allergen Reactions   Penicillins Hives and Rash    Has patient had a PCN reaction causing immediate rash, facial/tongue/throat swelling, SOB or lightheadedness with hypotension: Yes Has patient had a PCN reaction causing severe rash involving mucus membranes or skin necrosis: Yes Has patient had a PCN reaction that required hospitalization: No Has patient had a PCN reaction occurring within the last 10 years: No If all of the above answers are "NO",  then may proceed with Cephalosporin use.    Hct [Hydrochlorothiazide] Other (See Comments)    hyper   Statins Other (See Comments)    Myalgia, tolerates low dosages of lovastatin     Outpatient Encounter Medications as of 04/25/2021  Medication Sig   acetaminophen (TYLENOL) 500 MG tablet Take 1,000 mg by mouth every 6 (six) hours as needed for moderate pain.   Cholecalciferol (VITAMIN D3) 5000 units CAPS Take 5,000 Units by mouth once a week.    diltiazem (CARDIZEM CD) 120 MG 24 hr capsule TAKE ONE (1) CAPSULE EACH DAY   ezetimibe (ZETIA) 10 MG tablet TAKE ONE (1) TABLET EACH DAY   finasteride (PROSCAR) 5 MG tablet TAKE ONE (1) TABLET EACH DAY   fluticasone (FLONASE) 50 MCG/ACT nasal spray Place 1 spray into both nostrils daily as needed for allergies or rhinitis.   furosemide (LASIX) 20 MG tablet TAKE ONE (1) TABLET EACH DAY   lovastatin (MEVACOR) 20 MG tablet Take 0.5 tablets (10 mg total) by mouth every other day.   metoprolol (TOPROL-XL) 200 MG 24 hr tablet TAKE ONE (1) TABLET EACH DAY   Polyethyl Glycol-Propyl Glycol (SYSTANE OP) Place 1 drop into both eyes 3 (three) times daily as needed (dry eyes).    warfarin (COUMADIN) 5 MG tablet TAKE 1 TABLET ON SAT, SUN, TUES, & THURSTAKE 1/2 TABLET ON MON, WED, & FRIDAY   No facility-administered encounter medications on file as of 04/25/2021.    Patient Active Problem List   Diagnosis Date Noted   Ischemic cardiomyopathy 09/19/2019   Prediabetes 08/25/2019   CHF (congestive heart failure) ([place])  H/O total hip arthroplasty, left 05/04/2018   Dyslipidemia 12/16/2017   Stenosis of left carotid artery 12/16/2017   Peripheral vascular insufficiency ([place]) 11/05/2017   Primary osteoarthritis involving multiple joints 11/05/2017   Vitamin D deficiency 11/05/2017   Primary osteoarthritis of left knee 11/05/2017   Left-sided carotid artery disease ([place]) 06/02/2017   Coronary artery disease involving native coronary artery of native heart  without angina pectoris 19/41/7408   Metabolic syndrome 14/48/1856   [name] with elevated PSA 01/21/2015   Cataract of both eyes 05/25/2014   Long term current use of anticoagulant therapy 02/14/2013   Overweight 12/11/2009   Essential hypertension 02/25/2009   Coronary atherosclerosis 02/25/2009   Atrial fibrillation, persistent ([place]) 02/25/2009   GERD 02/25/2009   Osteoarthritis 02/25/2009    Conditions to be addressed/monitored: Monitor client completion of ADLs , as he is able   Care Plan : LCSW care plan  Updates made by Katha Cabal, LCSW since 04/25/2021 12:00 AM   Problem: Coping Skills (General Plan of Care)         Start Date: 04/25/2021  Expected End Date: 07/15/2021  This Visit's Progress: On track  Recent Progress: On track  Priority: Medium  Note:   Current barriers:   Patient in need of assistance with connecting to community resources for possible help in [name] Patient is unable to independently navigate community resource options without care coordination support Mobility issues  Clinical Goals:  patient will work with SW in next 30 days to address concerns related to ADLs completion for client  Patient will communicate in next 30 days with [name] or LCSW as needed for CCM support  Clinical Interventions:  Collaboration with [name], Fransisca Kaufmann, MD regarding development and update of comprehensive plan of care as evidenced by provider attestation and co-signature Talked with client about pain issues of client (he spoke of knee pain and hip pain) Talked with client about appetite of client Talked with client  about sleeping issues of client  Talked with client about vision needs of client (client has glasses to wear) Talked with client about upcoming medical appointments for client Talked with client about transport needs [name] said he drives car to appointments and to do errands) Talked with client about client support with  cardiologist Talked with client about breathing issues (he said he is breathing fine but sometimes has to take rest breaks due to his knees feeling weak or hips feeling weak) Talked with client about client history of heart issues (she said he had bypass surgery for his heart in 2005) Talked with client about energy level of client Talked with client about client relaxation techniques (likes to work in his garden,likes to [employer] yard and do outdoor activities) Talked with client about family support (daughter is supportive) Encouraged client or [name] to call [name] as needed for nursing support for client Talked with client about ADLs completion of client  Patient Coping Skills/Strengths: Has support from his spouse, Lydell Moga Does some ADLs on his own Attends scheduled medical appointments  Patient Deficits: Needs  some help with completing certain ADLs Some mobility challenges  Patient Goals:  Attend scheduled medical appointments in next 30 days Talk regularly with spouse, in next 30 days, about client needs Take medications as prescribed in next 30 days Call [facility] as needed in next 30 days for nursing support for client  Follow Up Plan: LCSW to call client or his spouse on 06/04/21      Legrand Como  S.Ayvion Kavanagh MSW, LCSW Licensed Clinical Social Worker [facility] Care Management [phone]

## 2021-04-25 NOTE — Patient Instructions (Signed)
Visit Information  PATIENT GOALS:  Goals Addressed             This Visit's Progress    Protect My Health;Complete ADLs daily, as able       Timeframe:  Short-Term Goal Priority:  Medium Progress: On Track Start Date:        04/25/21                Expected End Date:       07/15/21            Follow Up Date 06/04/21   Protect My Health (Patient) Complete ADLs daily, as able   Why is this important?   Screening tests can find diseases early when they are easier to treat.  Your doctor or nurse will talk with you about which tests are important for you.  Getting shots for common diseases like the flu and shingles will help prevent them.     Patient Coping Skills/Strengths: Has support from his spouse, Kwabena Strutz Does some ADLs on his own Attends scheduled medical appointments  Patient Deficits: Needs  some help with completing certain ADLs Some mobility challenges  Patient Goals:  Attend scheduled medical appointments in next 30 days Talk regularly with spouse, in next 30 days, about client needs Take medications as prescribed in next 30 days Call [facility] as needed in next 30 days for nursing support for client  Follow Up Plan: LCSW to call client or his spouse on 06/04/21     Norva Riffle.Abrar Koone MSW, LCSW Licensed Clinical Social Worker [facility] Care Management [phone]

## 2021-05-08 ENCOUNTER — Other Ambulatory Visit: Payer: Self-pay | Admitting: Family Medicine

## 2021-05-28 ENCOUNTER — Other Ambulatory Visit: Payer: Self-pay

## 2021-05-28 ENCOUNTER — Ambulatory Visit (INDEPENDENT_AMBULATORY_CARE_PROVIDER_SITE_OTHER): Payer: Medicare Other | Admitting: Family Medicine

## 2021-05-28 ENCOUNTER — Encounter: Payer: Self-pay | Admitting: Family Medicine

## 2021-05-28 VITALS — BP 172/98 | HR 68 | Ht 67.0 in | Wt 186.0 lb

## 2021-05-28 DIAGNOSIS — R7303 Prediabetes: Secondary | ICD-10-CM | POA: Diagnosis not present

## 2021-05-28 DIAGNOSIS — E785 Hyperlipidemia, unspecified: Secondary | ICD-10-CM | POA: Diagnosis not present

## 2021-05-28 DIAGNOSIS — Z7901 Long term (current) use of anticoagulants: Secondary | ICD-10-CM | POA: Diagnosis not present

## 2021-05-28 DIAGNOSIS — R972 Elevated prostate specific antigen [PSA]: Secondary | ICD-10-CM

## 2021-05-28 DIAGNOSIS — I4819 Other persistent atrial fibrillation: Secondary | ICD-10-CM | POA: Diagnosis not present

## 2021-05-28 DIAGNOSIS — I10 Essential (primary) hypertension: Secondary | ICD-10-CM

## 2021-05-28 DIAGNOSIS — N400 Enlarged prostate without lower urinary tract symptoms: Secondary | ICD-10-CM

## 2021-05-28 LAB — COAGUCHEK XS/INR WAIVED
INR: 3.1 — ABNORMAL HIGH (ref 0.9–1.1)
Prothrombin Time: 37.7 s

## 2021-05-28 NOTE — Progress Notes (Signed)
BP (!) 172/98   Pulse 68   Ht 5' 7"  (1.702 m)   Wt 186 lb (84.4 kg)   SpO2 96%   BMI 29.13 kg/m    Subjective:   Patient ID: [name], male    DOB: 26-Apr-1932, 87 y.o.   MRN: [id number]  HPI: PURVIS SIDLE is a 87 y.o. male presenting on 05/28/2021 for Atrial Fibrillation and Hypertension   HPI Coumadin recheck Target goal: 2.0-3.0 Reason on anticoagulation: Persistent A. fib Patient denies any bruising or bleeding or chest pain or palpitations   Relevant past medical, surgical, family and social history reviewed and updated as indicated. Interim medical history since our last visit reviewed. Allergies and medications reviewed and updated.  Review of Systems  Constitutional:  Negative for chills and fever.  Respiratory:  Negative for shortness of breath and wheezing.   Cardiovascular:  Negative for chest pain and leg swelling.  Gastrointestinal:  Positive for blood in stool.  Genitourinary:  Positive for hematuria.  Skin:  Negative for rash.  All other systems reviewed and are negative.  Per HPI unless specifically indicated above   Allergies as of 05/28/2021       Reactions   Penicillins Hives, Rash   Has patient had a PCN reaction causing immediate rash, facial/tongue/throat swelling, SOB or lightheadedness with hypotension: Yes Has patient had a PCN reaction causing severe rash involving mucus membranes or skin necrosis: Yes Has patient had a PCN reaction that required hospitalization: No Has patient had a PCN reaction occurring within the last 10 years: No If all of the above answers are "NO", then may proceed with Cephalosporin use.   Hct [hydrochlorothiazide] Other (See Comments)   hyper   Statins Other (See Comments)   Myalgia, tolerates low dosages of lovastatin         Medication List        Accurate as of May 28, 2021 11:35 AM. If you have any questions, ask your nurse or doctor.          acetaminophen 500 MG tablet Commonly known as:  TYLENOL Take 1,000 mg by mouth every 6 (six) hours as needed for moderate pain.   diltiazem 120 MG 24 hr capsule Commonly known as: CARDIZEM CD TAKE ONE (1) CAPSULE EACH DAY   ezetimibe 10 MG tablet Commonly known as: ZETIA TAKE ONE (1) TABLET EACH DAY   finasteride 5 MG tablet Commonly known as: PROSCAR TAKE ONE (1) TABLET EACH DAY   fluticasone 50 MCG/ACT nasal spray Commonly known as: FLONASE Place 1 spray into both nostrils daily as needed for allergies or rhinitis.   furosemide 20 MG tablet Commonly known as: LASIX TAKE ONE (1) TABLET EACH DAY   lovastatin 20 MG tablet Commonly known as: MEVACOR Take 0.5 tablets (10 mg total) by mouth every other day.   metoprolol 200 MG 24 hr tablet Commonly known as: TOPROL-XL TAKE ONE (1) TABLET EACH DAY   SYSTANE OP Place 1 drop into both eyes 3 (three) times daily as needed (dry eyes).   Vitamin D3 125 MCG (5000 UT) Caps Take 5,000 Units by mouth once a week.   warfarin 5 MG tablet Commonly known as: COUMADIN Take as directed by the anticoagulation clinic. If you are unsure how to take this medication, talk to your nurse or doctor. Original instructions: TAKE 1 TABLET ON SAT, SUN, TUES, & THURSTAKE 1/2 TABLET ON MON, WED, & FRIDAY         Objective:  BP (!) 172/98   Pulse 68   Ht 5' 7"  (1.702 m)   Wt 186 lb (84.4 kg)   SpO2 96%   BMI 29.13 kg/m   Wt Readings from Last 3 Encounters:  05/28/21 186 lb (84.4 kg)  03/31/21 187 lb (84.8 kg)  01/20/21 192 lb (87.1 kg)    Physical Exam Vitals and nursing note reviewed.  Constitutional:      General: He is not in acute distress.    Appearance: He is well-developed. He is not diaphoretic.  Eyes:     General: No scleral icterus.    Conjunctiva/sclera: Conjunctivae normal.  Skin:    General: Skin is warm and dry.     Findings: No rash.  Neurological:     Mental Status: He is alert and oriented to person, place, and time.     Coordination: Coordination normal.   Psychiatric:        Behavior: Behavior normal.    Results for orders placed or performed in visit on 03/31/21  CoaguChek XS/INR Waived  Result Value Ref Range   INR 2.3 (H) 0.9 - 1.1   Prothrombin Time 27.5 sec    Assessment & Plan:   Problem List Items Addressed This Visit       Cardiovascular and Mediastinum   Essential hypertension - Primary   Atrial fibrillation, persistent ([place])   Relevant Orders   CoaguChek XS/INR Waived     Genitourinary   [name] with elevated PSA   Relevant Orders   PSA, total and free     Other   Dyslipidemia   Relevant Orders   Lipid panel   Prediabetes   Relevant Orders   CBC with Differential/Platelet   CMP14+EGFR   Bayer DCA Hb A1c Waived   Long term current use of anticoagulant therapy    Description   Hold today and then continue weekly dose by taking 1 tablet every day except for Mondays and Thursdays take 1/2 tablet  INR today is  3.1 (goal is 2-3)    Recheck in 6-8 weeks    BP elevated, we will keep an eye on it at home and let us know what the numbers are running.  We will recheck next time Follow up plan: Return if symptoms worsen or fail to improve, for Physical and Coumadin recheck, already has scheduled in October.  Counseling provided for all of the vaccine components Orders Placed This Encounter  Procedures   CBC with Differential/Platelet   CMP14+EGFR   Lipid panel   PSA, total and free   CoaguChek XS/INR Waived   Bayer [facility] Hb A1c Waived    Caryl Pina, MD [facility] 05/28/2021, 11:35 AM

## 2021-06-04 ENCOUNTER — Telehealth: Payer: Medicare Other

## 2021-06-05 ENCOUNTER — Other Ambulatory Visit: Payer: Self-pay | Admitting: Family Medicine

## 2021-07-14 ENCOUNTER — Telehealth: Payer: Medicare Other

## 2021-07-17 DIAGNOSIS — Z23 Encounter for immunization: Secondary | ICD-10-CM | POA: Diagnosis not present

## 2021-08-05 ENCOUNTER — Other Ambulatory Visit: Payer: Self-pay | Admitting: Family Medicine

## 2021-08-13 ENCOUNTER — Ambulatory Visit (INDEPENDENT_AMBULATORY_CARE_PROVIDER_SITE_OTHER): Payer: Medicare Other | Admitting: Family Medicine

## 2021-08-13 ENCOUNTER — Other Ambulatory Visit: Payer: Self-pay

## 2021-08-13 ENCOUNTER — Encounter: Payer: Self-pay | Admitting: Family Medicine

## 2021-08-13 VITALS — BP 162/93 | HR 82 | Ht 67.0 in | Wt 187.0 lb

## 2021-08-13 DIAGNOSIS — N400 Enlarged prostate without lower urinary tract symptoms: Secondary | ICD-10-CM | POA: Diagnosis not present

## 2021-08-13 DIAGNOSIS — I10 Essential (primary) hypertension: Secondary | ICD-10-CM | POA: Diagnosis not present

## 2021-08-13 DIAGNOSIS — E785 Hyperlipidemia, unspecified: Secondary | ICD-10-CM | POA: Diagnosis not present

## 2021-08-13 DIAGNOSIS — Z23 Encounter for immunization: Secondary | ICD-10-CM | POA: Diagnosis not present

## 2021-08-13 DIAGNOSIS — R972 Elevated prostate specific antigen [PSA]: Secondary | ICD-10-CM

## 2021-08-13 DIAGNOSIS — Z7901 Long term (current) use of anticoagulants: Secondary | ICD-10-CM | POA: Diagnosis not present

## 2021-08-13 DIAGNOSIS — R7303 Prediabetes: Secondary | ICD-10-CM | POA: Diagnosis not present

## 2021-08-13 DIAGNOSIS — I4819 Other persistent atrial fibrillation: Secondary | ICD-10-CM | POA: Diagnosis not present

## 2021-08-13 LAB — COAGUCHEK XS/INR WAIVED
INR: 2.2 — ABNORMAL HIGH (ref 0.9–1.1)
Prothrombin Time: 26.7 s

## 2021-08-13 MED ORDER — FINASTERIDE 5 MG PO TABS: ORAL_TABLET | ORAL | 3 refills | Status: DC

## 2021-08-13 MED ORDER — DILTIAZEM HCL ER COATED BEADS 120 MG PO CP24: ORAL_CAPSULE | ORAL | 3 refills | Status: DC

## 2021-08-13 MED ORDER — LOVASTATIN 20 MG PO TABS: ORAL_TABLET | ORAL | 3 refills | Status: DC

## 2021-08-13 NOTE — Progress Notes (Signed)
BP (!) 162/93   Pulse 82   Ht 5\' 7"  (1.702 m)   Wt 187 lb (84.8 kg)   SpO2 96%   BMI 29.29 kg/m    Subjective:   Patient ID: [name], male    DOB: 06-05-1932, 87 y.o.   MRN: [id number]  HPI: [name] is a 87 y.o. male presenting on 08/13/2021 for Medical Management of Chronic Issues, Hypertension, and Atrial Fibrillation   HPI Coumadin recheck Target goal: 2.0-3.0 Reason on anticoagulation: Persistent A. fib Patient denies any bruising or bleeding or chest pain or palpitations   Hypertension Patient is currently on metoprolol and Lasix and diltiazem, he does admit he did not take his medicines this morning, and their blood pressure today is 162/93, this has been checking well at home in the 130s over 70s consistently and they do check twice a week.. Patient denies any lightheadedness or dizziness. Patient denies headaches, blurred vision, chest pains, shortness of breath, or weakness. Denies any side effects from medication and is content with current medication.    Hyperlipidemia Patient is coming in for recheck of his hyperlipidemia. The patient is currently taking lovastatin and Zetia. They deny any issues with myalgias or history of liver damage from it. They deny any focal numbness or weakness or chest pain.   Prediabetes  patient comes in today for recheck of his diabetes. Patient has been currently taking no medication, has been diet controlled. Patient is not currently on an ACE inhibitor/ARB. Patient has not seen an ophthalmologist this year. Patient denies any issues with their feet. The symptom started onset as an adult hypertension and hyperlipidemia and [name] and PVD and A. fib ARE RELATED TO DM   Relevant past medical, surgical, family and social history reviewed and updated as indicated. Interim medical history since our last visit reviewed. Allergies and medications reviewed and updated.  Review of Systems  Constitutional:  Negative for chills and fever.   Eyes:  Negative for visual disturbance.  Respiratory:  Negative for shortness of breath and wheezing.   Cardiovascular:  Negative for chest pain and leg swelling.  Gastrointestinal:  Negative for blood in stool.  Genitourinary:  Negative for hematuria.  Musculoskeletal:  Negative for back pain and gait problem.  Skin:  Negative for rash.  Neurological:  Negative for dizziness, weakness and light-headedness.  All other systems reviewed and are negative.  Per HPI unless specifically indicated above   Allergies as of 08/13/2021       Reactions   Penicillins Hives, Rash   Has patient had a PCN reaction causing immediate rash, facial/tongue/throat swelling, SOB or lightheadedness with hypotension: Yes Has patient had a PCN reaction causing severe rash involving mucus membranes or skin necrosis: Yes Has patient had a PCN reaction that required hospitalization: No Has patient had a PCN reaction occurring within the last 10 years: No If all of the above answers are "NO", then may proceed with Cephalosporin use.   Hct [hydrochlorothiazide] Other (See Comments)   hyper   Statins Other (See Comments)   Myalgia, tolerates low dosages of lovastatin         Medication List        Accurate as of August 13, 2021 11:12 AM. If you have any questions, ask your nurse or doctor.          acetaminophen 500 MG tablet Commonly known as: TYLENOL Take 1,000 mg by mouth every 6 (six) hours as needed for moderate pain.  diltiazem 120 MG 24 hr capsule Commonly known as: CARDIZEM CD Take one capsule daily What changed: See the new instructions. Changed by: Fransisca Kaufmann Jazari Ober, MD   ezetimibe 10 MG tablet Commonly known as: ZETIA TAKE ONE (1) TABLET EACH DAY   finasteride 5 MG tablet Commonly known as: PROSCAR Take one tablet each day What changed: See the new instructions. Changed by: Fransisca Kaufmann Damario Gillie, MD   fluticasone 50 MCG/ACT nasal spray Commonly known as: FLONASE Place 1  spray into both nostrils daily as needed for allergies or rhinitis.   furosemide 20 MG tablet Commonly known as: LASIX TAKE ONE (1) TABLET EACH DAY   lovastatin 20 MG tablet Commonly known as: MEVACOR TAKE 1/2 TABLET EVERY OTHER DAY   metoprolol 200 MG 24 hr tablet Commonly known as: TOPROL-XL TAKE ONE (1) TABLET EACH DAY   SYSTANE OP Place 1 drop into both eyes 3 (three) times daily as needed (dry eyes).   Vitamin D3 125 MCG (5000 UT) Caps Take 5,000 Units by mouth once a week.   warfarin 5 MG tablet Commonly known as: COUMADIN Take as directed by the anticoagulation clinic. If you are unsure how to take this medication, talk to your nurse or doctor. Original instructions: TAKE 1 TABLET ON SAT, SUN, TUES, & THURSTAKE 1/2 TABLET ON MON, WED, & FRIDAY         Objective:   BP (!) 162/93   Pulse 82   Ht 5\' 7"  (1.702 m)   Wt 187 lb (84.8 kg)   SpO2 96%   BMI 29.29 kg/m   Wt Readings from Last 3 Encounters:  08/13/21 187 lb (84.8 kg)  05/28/21 186 lb (84.4 kg)  03/31/21 187 lb (84.8 kg)    Physical Exam Vitals and nursing note reviewed.  Constitutional:      General: He is not in acute distress.    Appearance: He is well-developed. He is not diaphoretic.  Eyes:     General: No scleral icterus.    Conjunctiva/sclera: Conjunctivae normal.  Neck:     Thyroid: No thyromegaly.  Cardiovascular:     Rate and Rhythm: Normal rate and regular rhythm.     Heart sounds: Normal heart sounds. No murmur heard. Pulmonary:     Effort: Pulmonary effort is normal. No respiratory distress.     Breath sounds: Normal breath sounds. No wheezing.  Musculoskeletal:        General: Normal range of motion.     Cervical back: Neck supple.  Lymphadenopathy:     Cervical: No cervical adenopathy.  Skin:    General: Skin is warm and dry.     Findings: No rash.  Neurological:     Mental Status: He is alert and oriented to person, place, and time.     Coordination: Coordination normal.   Psychiatric:        Behavior: Behavior normal.      Assessment & Plan:   Problem List Items Addressed This Visit       Cardiovascular and Mediastinum   Essential hypertension   Relevant Medications   diltiazem (CARDIZEM CD) 120 MG 24 hr capsule   lovastatin (MEVACOR) 20 MG tablet   Atrial fibrillation, persistent (HCC)   Relevant Medications   diltiazem (CARDIZEM CD) 120 MG 24 hr capsule   lovastatin (MEVACOR) 20 MG tablet     Genitourinary   [name] with elevated PSA - Primary   Relevant Medications   finasteride (PROSCAR) 5 MG tablet   Other Relevant  Orders   Lipid panel   CoaguChek XS/INR Waived   PSA, total and free     Other   Long term current use of anticoagulant therapy   Relevant Orders   Lipid panel   CoaguChek XS/INR Waived   PSA, total and free   Dyslipidemia   Relevant Medications   lovastatin (MEVACOR) 20 MG tablet   Other Relevant Orders   Lipid panel   CoaguChek XS/INR Waived   PSA, total and free   Prediabetes    Continue current medicine, will check blood work today. Description   continue weekly dose by taking 1 tablet every day except for Mondays and Thursdays take 1/2 tablet  INR today is  2.2 (goal is 2-3)    Recheck in 6-8 weeks     Follow up plan: Return if symptoms worsen or fail to improve, for 6 to 8-week INR and 7-month recheck.  Counseling provided for all of the vaccine components Orders Placed This Encounter  Procedures   Lipid panel   CoaguChek XS/INR Waived   PSA, total and free    Caryl Pina, MD [facility] 08/13/2021, 11:12 AM

## 2021-08-14 LAB — PSA, TOTAL AND FREE
PSA, Free Pct: 10.6 %
PSA, Free: 1.14 ng/mL
Prostate Specific Ag, Serum: 10.8 ng/mL — ABNORMAL HIGH (ref 0.0–4.0)

## 2021-08-14 LAB — LIPID PANEL
Chol/HDL Ratio: 3.8 ratio (ref 0.0–5.0)
Cholesterol, Total: 198 mg/dL (ref 100–199)
HDL: 52 mg/dL (ref >39)
LDL Chol Calc (NIH): 124 mg/dL — ABNORMAL HIGH (ref 0–99)
Triglycerides: 121 mg/dL (ref 0–149)
VLDL Cholesterol Cal: 22 mg/dL (ref 5–40)

## 2021-08-19 ENCOUNTER — Telehealth: Payer: Self-pay | Admitting: Family Medicine

## 2021-08-19 NOTE — Progress Notes (Signed)
Wife r/c about labs

## 2021-09-01 ENCOUNTER — Telehealth: Payer: Medicare Other

## 2021-09-16 DIAGNOSIS — I420 Dilated cardiomyopathy: Secondary | ICD-10-CM | POA: Insufficient documentation

## 2021-09-16 NOTE — Progress Notes (Signed)
Cardiology Office Note   Date:  09/17/2021   ID:  [name], DOB 01-09-1932, MRN [id number]  PCP:  [name], Fransisca Kaufmann, MD  Cardiologist:   Minus Breeding, MD   Chief Complaint  Patient presents with   Atrial Fibrillation   Fatigue       History of Present Illness: [name] is a 87 y.o. male who presents for followup of his coronary disease and atrial fibrillation.   Lexiscan Myoview in June 2018 demonstrated infarct but no ischemia.  EF on echo was 40 - 45%.  This was done to evaluate chest pain.  He returns for follow up.  At the last visit I stopped amiodarone as it appeared that he was in chronic atrial fib.   Since I last saw him he has done okay.  His wife fractured her ankle last year and he has had to do a lot of the stuff around the house.  He may have overdone it.  He has a sore spot on his right lower rib.  It hurts certain times to press or to move a certain way.  He is not having any substernal chest pressure, neck or arm discomfort.  He rarely notices any palpitations.  He is had no presyncope or syncope.  He does report a lot of fatigue which she ascribes to his 200 mg metoprolol.  He has split up a couple times and felt well and wonders if he could reduce the dose or split the pill permanently.  The last several days he has been taking the 200 mg as prescribed.   Past Medical History:  Diagnosis Date   Anemia    Atrial fibrillation (HCC)    Cataract    Cellulitis    In past   Complication of anesthesia    HARD TIME WAKING; CAUSES MY BP TO GO UP    Coronary artery disease    5 bypasses   DJD (degenerative joint disease)    Ejection fraction < 50%    Mildly reduced, 40% by echo   GERD (gastroesophageal reflux disease)    Hyperlipidemia    Hypertension    Persistent atrial fibrillation ([place])    Prostate hypertrophy    on CT scan 09/2014   PUD (peptic ulcer disease)    RESOLVED    Skin cancer of eyelid    Resected    Past Surgical History:   Procedure Laterality Date   APPENDECTOMY     BYPASS GRAFT  2005   CATARACT EXTRACTION W/PHACO Left 07/22/2015   Procedure: CATARACT EXTRACTION PHACO AND INTRAOCULAR LENS PLACEMENT LEFT EYE CDE=8.14;  Surgeon: Tonny Branch, MD;  Location: [facility];  Service: Ophthalmology;  Laterality: Left;   CATARACT EXTRACTION W/PHACO Right 08/18/2019   Procedure: CATARACT EXTRACTION PHACO AND INTRAOCULAR LENS PLACEMENT (IOC);  Surgeon: Baruch Goldmann, MD;  Location: [facility];  Service: Ophthalmology;  Laterality: Right;  CDE: 9.74   CORONARY ARTERY BYPASS GRAFT  4/05   LIMA to LAD, SVG to PDA, SVG to ramus intermediate   EYE SURGERY  07/2015   EYELID CARCINOMA EXCISION     Skin cancer resected   Heart bypass  2005   TOTAL HIP ARTHROPLASTY     Right   TOTAL HIP ARTHROPLASTY Left 05/04/2018   Procedure: LEFT TOTAL HIP ARTHROPLASTY;  Surgeon: [name], MD;  Location: [facility];  Service: Orthopedics;  Laterality: Left;   TOTAL KNEE ARTHROPLASTY     Right     Current Outpatient  Medications  Medication Sig Dispense Refill   acetaminophen (TYLENOL) 500 MG tablet Take 1,000 mg by mouth every 6 (six) hours as needed for moderate pain.     Cholecalciferol (VITAMIN D3) 5000 units CAPS Take 5,000 Units by mouth once a week.      diltiazem (CARDIZEM CD) 120 MG 24 hr capsule Take one capsule daily 90 capsule 3   ezetimibe (ZETIA) 10 MG tablet TAKE ONE (1) TABLET EACH DAY 90 tablet 1   finasteride (PROSCAR) 5 MG tablet Take one tablet each day 90 tablet 3   fluticasone (FLONASE) 50 MCG/ACT nasal spray Place 1 spray into both nostrils daily as needed for allergies or rhinitis.     furosemide (LASIX) 20 MG tablet TAKE ONE (1) TABLET EACH DAY 90 tablet 1   lovastatin (MEVACOR) 20 MG tablet TAKE 1/2 TABLET EVERY OTHER DAY 23 tablet 3   metoprolol tartrate (LOPRESSOR) 100 MG tablet Take 1 tablet (100 mg total) by mouth 2 (two) times daily. 180 tablet 3   Polyethyl Glycol-Propyl Glycol (SYSTANE OP) Place 1 drop into  both eyes 3 (three) times daily as needed (dry eyes).      warfarin (COUMADIN) 5 MG tablet TAKE 1 TABLET ON SAT, SUN, TUES, & THURSTAKE 1/2 TABLET ON MON, WED, & FRIDAY 70 tablet 2   No current facility-administered medications for this visit.    Allergies:   Penicillins, Hct [hydrochlorothiazide], and Statins   ROS:  Please see the history of present illness.   Otherwise, review of systems are positive for none.   All other systems are reviewed and negative.    PHYSICAL EXAM: VS:  BP (!) 149/90   Pulse 77   Ht 5' 6.5" (1.689 m)   Wt 190 lb (86.2 kg)   BMI 30.21 kg/m  , BMI Body mass index is 30.21 kg/m. GENERAL:  Well appearing NECK:  No jugular venous distention, waveform within normal limits, carotid upstroke brisk and symmetric, no bruits, no thyromegaly LUNGS:  Clear to auscultation bilaterally CHEST:  Unremarkable HEART:  PMI not displaced or sustained,S1 and S2 within normal limits, no S3, no clicks, no rubs, no murmurs. Irregular  ABD:  Flat, positive bowel sounds normal in frequency in pitch, no bruits, no rebound, no guarding, no midline pulsatile mass, no hepatomegaly, no splenomegaly EXT:  2 plus pulses upper pulses, absent dorsalis pedis posterior tibialis bilateral, no edema, no cyanosis no clubbing   EKG:  EKG is ordered today. The ekg ordered today demonstrates atrial fibrillation, rate 77, axis within normal limits, intervals within normal limits, no acute ST-T wave changes.   Recent Labs: 11/27/2020: ALT 16; BUN 19; Creatinine, Ser 1.11; Hemoglobin 14.9; Platelets 199; Potassium 4.4; Sodium 145    Lipid Panel    Component Value Date/Time   CHOL 198 08/13/2021 1100   CHOL 173 05/08/2013 0925   TRIG 121 08/13/2021 1100   TRIG 156 (H) 07/07/2016 0954   TRIG 133 05/08/2013 0925   HDL 52 08/13/2021 1100   HDL 58 07/07/2016 0954   HDL 48 05/08/2013 0925   CHOLHDL 3.8 08/13/2021 1100   LDLCALC 124 (H) 08/13/2021 1100   LDLCALC 104 (H) 07/05/2014 1003    LDLCALC 98 05/08/2013 0925      Wt Readings from Last 3 Encounters:  09/17/21 190 lb (86.2 kg)  08/13/21 187 lb (84.8 kg)  05/28/21 186 lb (84.4 kg)      Other studies Reviewed: Additional studies/ records that were reviewed today include: Labs. Review of  the above records demonstrates:  Please see elsewhere in the note.     ASSESSMENT AND PLAN:  [name]:   The patient has no new sypmtoms.  No further cardiovascular testing is indicated.  We will continue with aggressive risk reduction and meds as listed.  CM:   I would like for him to stay on a higher dose beta-blocker if he can but I will switch it to tartrate 100 mg twice daily.  He will otherwise continue on the meds as listed.  He seems to be euvolemic.   HTN:  The blood pressure is slightly elevated but this is unusual.  He is going to keep a blood pressure diary and as above I would like him not titrate his meds downward if I do not have to.   HYPERLIPIDEMIA:  LDL was 124 but he does not want to change medicines as he is been too sensitive to med changes in the past and this is all he can tolerate.  We talked about more of a plant-based diet.    ATRIAL FIBRILLATION:   [name] has a CHA2DS2 - VASc score of 4.  He tolerates anticoagulation.  Rate control is as above.   CAROTID STENOSIS:  He had a Doppler in 2018.   No change in therapy or further imaging.    Current medicines are reviewed at length with the patient today.  The patient does not have concerns regarding medicines.  The following changes have been made:  no change  Labs/ tests ordered today include: None  Orders Placed This Encounter  Procedures   EKG 12-Lead      Disposition:   FU with me in 12 months.     Signed, Minus Breeding, MD  09/17/2021 1:59 PM    [facility]

## 2021-09-17 ENCOUNTER — Other Ambulatory Visit: Payer: Self-pay

## 2021-09-17 ENCOUNTER — Ambulatory Visit (INDEPENDENT_AMBULATORY_CARE_PROVIDER_SITE_OTHER): Payer: Medicare Other | Admitting: Cardiology

## 2021-09-17 ENCOUNTER — Encounter: Payer: Self-pay | Admitting: Cardiology

## 2021-09-17 VITALS — BP 149/90 | HR 77 | Ht 66.5 in | Wt 190.0 lb

## 2021-09-17 DIAGNOSIS — E785 Hyperlipidemia, unspecified: Secondary | ICD-10-CM

## 2021-09-17 DIAGNOSIS — I420 Dilated cardiomyopathy: Secondary | ICD-10-CM

## 2021-09-17 DIAGNOSIS — I10 Essential (primary) hypertension: Secondary | ICD-10-CM | POA: Diagnosis not present

## 2021-09-17 DIAGNOSIS — I4819 Other persistent atrial fibrillation: Secondary | ICD-10-CM | POA: Diagnosis not present

## 2021-09-17 DIAGNOSIS — I2510 Atherosclerotic heart disease of native coronary artery without angina pectoris: Secondary | ICD-10-CM | POA: Diagnosis not present

## 2021-09-17 MED ORDER — METOPROLOL TARTRATE 100 MG PO TABS: 100.0000 mg | ORAL_TABLET | Freq: Two times a day (BID) | ORAL | 3 refills | Status: DC

## 2021-09-17 NOTE — Patient Instructions (Signed)
Medication Instructions:  Please discontinue your Metoprolol Succinate and start Metoprolol Tartrate 100 mg twice a day. Continue all other medications as listed.  *If you need a refill on your cardiac medications before your next appointment, please call your pharmacy*  Follow-Up: At [facility], you and your health needs are our priority.  As part of our continuing mission to provide you with exceptional heart care, we have created designated Provider Care Teams.  These Care Teams include your primary Cardiologist (physician) and Advanced Practice Providers (APPs -  Physician Assistants and Nurse Practitioners) who all work together to provide you with the care you need, when you need it.  We recommend signing up for the patient portal called "MyChart".  Sign up information is provided on this After Visit Summary.  MyChart is used to connect with patients for Virtual Visits (Telemedicine).  Patients are able to view lab/test results, encounter notes, upcoming appointments, etc.  Non-urgent messages can be sent to your provider as well.   To learn more about what you can do with MyChart, go to [email or web address].    Your next appointment:   1 year(s)  The format for your next appointment:   In Person  Provider:   Minus Breeding, MD    Thank you for choosing [facility]!!

## 2021-10-09 ENCOUNTER — Other Ambulatory Visit: Payer: Self-pay | Admitting: Family Medicine

## 2021-10-15 ENCOUNTER — Ambulatory Visit (INDEPENDENT_AMBULATORY_CARE_PROVIDER_SITE_OTHER): Payer: Medicare Other | Admitting: Family Medicine

## 2021-10-15 ENCOUNTER — Encounter: Payer: Self-pay | Admitting: Family Medicine

## 2021-10-15 VITALS — BP 153/87 | HR 76 | Ht 66.5 in | Wt 187.0 lb

## 2021-10-15 DIAGNOSIS — I10 Essential (primary) hypertension: Secondary | ICD-10-CM | POA: Diagnosis not present

## 2021-10-15 DIAGNOSIS — R7303 Prediabetes: Secondary | ICD-10-CM

## 2021-10-15 DIAGNOSIS — E785 Hyperlipidemia, unspecified: Secondary | ICD-10-CM | POA: Diagnosis not present

## 2021-10-15 DIAGNOSIS — Z7901 Long term (current) use of anticoagulants: Secondary | ICD-10-CM

## 2021-10-15 DIAGNOSIS — I4819 Other persistent atrial fibrillation: Secondary | ICD-10-CM

## 2021-10-15 LAB — COAGUCHEK XS/INR WAIVED
INR: 2.3 — ABNORMAL HIGH (ref 0.9–1.1)
Prothrombin Time: 27.2 s

## 2021-10-15 NOTE — Progress Notes (Signed)
BP (!) 153/87    Pulse 76    Ht 5' 6.5" (1.689 m)    Wt 187 lb (84.8 kg)    SpO2 100%    BMI 29.73 kg/m    Subjective:   Patient ID: [name], male    DOB: 1932/01/13, 88 y.o.   MRN: [id number]  HPI: [name] is a 88 y.o. male presenting on 10/15/2021 for Medical Management of Chronic Issues, Atrial Fibrillation, and Hypertension   HPI Prediabetes  patient comes in today for recheck of his diabetes. Patient has been currently taking diet controlled. Patient is not currently on an ACE inhibitor/ARB. Patient has seen an ophthalmologist this year. Patient denies any issues with their feet. The symptom started onset as an adult hyperlipidemia and hypertension and A. fib ARE RELATED TO DM   Hyperlipidemia Patient is coming in for recheck of his hyperlipidemia. The patient is currently taking Zetia and lovastatin. They deny any issues with myalgias or history of liver damage from it. They deny any focal numbness or weakness or chest pain.   Hypertension Patient is currently on furosemide and diltiazem and metoprolol, and their blood pressure today is 153/87. Patient denies any lightheadedness or dizziness. Patient denies headaches, blurred vision, chest pains, shortness of breath, or weakness. Denies any side effects from medication and is content with current medication.   Coumadin recheck Target goal: 2-3 Reason on anticoagulation: A. fib Patient denies any bruising or bleeding or chest pain or palpitations   Relevant past medical, surgical, family and social history reviewed and updated as indicated. Interim medical history since our last visit reviewed. Allergies and medications reviewed and updated.  Review of Systems  Constitutional:  Negative for chills and fever.  Eyes:  Negative for visual disturbance.  Respiratory:  Negative for shortness of breath and wheezing.   Cardiovascular:  Negative for chest pain and leg swelling.  Musculoskeletal:  Negative for back pain and  gait problem.  Skin:  Negative for rash.  Neurological:  Negative for dizziness, weakness and numbness.  All other systems reviewed and are negative.  Per HPI unless specifically indicated above   Allergies as of 10/15/2021       Reactions   Penicillins Hives, Rash   Has patient had a PCN reaction causing immediate rash, facial/tongue/throat swelling, SOB or lightheadedness with hypotension: Yes Has patient had a PCN reaction causing severe rash involving mucus membranes or skin necrosis: Yes Has patient had a PCN reaction that required hospitalization: No Has patient had a PCN reaction occurring within the last 10 years: No If all of the above answers are "NO", then may proceed with Cephalosporin use.   Hct [hydrochlorothiazide] Other (See Comments)   hyper   Statins Other (See Comments)   Myalgia, tolerates low dosages of lovastatin         Medication List        Accurate as of October 15, 2021 11:06 AM. If you have any questions, ask your nurse or doctor.          acetaminophen 500 MG tablet Commonly known as: TYLENOL Take 1,000 mg by mouth every 6 (six) hours as needed for moderate pain.   diltiazem 120 MG 24 hr capsule Commonly known as: CARDIZEM CD Take one capsule daily   ezetimibe 10 MG tablet Commonly known as: ZETIA TAKE ONE (1) TABLET EACH DAY   finasteride 5 MG tablet Commonly known as: PROSCAR Take one tablet each day   fluticasone 50 MCG/ACT  nasal spray Commonly known as: FLONASE Place 1 spray into both nostrils daily as needed for allergies or rhinitis.   furosemide 20 MG tablet Commonly known as: LASIX TAKE ONE (1) TABLET EACH DAY   lovastatin 20 MG tablet Commonly known as: MEVACOR TAKE 1/2 TABLET EVERY OTHER DAY   metoprolol tartrate 100 MG tablet Commonly known as: LOPRESSOR Take 1 tablet (100 mg total) by mouth 2 (two) times daily.   SYSTANE OP Place 1 drop into both eyes 3 (three) times daily as needed (dry eyes).   Vitamin D3  125 MCG (5000 UT) Caps Take 5,000 Units by mouth once a week.   warfarin 5 MG tablet Commonly known as: COUMADIN Take as directed by the anticoagulation clinic. If you are unsure how to take this medication, talk to your nurse or doctor. Original instructions: TAKE 1 TABLET ON SAT, SUN, TUES, & THURSTAKE 1/2 TABLET ON MON, WED, & FRIDAY         Objective:   BP (!) 153/87    Pulse 76    Ht 5' 6.5" (1.689 m)    Wt 187 lb (84.8 kg)    SpO2 100%    BMI 29.73 kg/m   Wt Readings from Last 3 Encounters:  10/15/21 187 lb (84.8 kg)  09/17/21 190 lb (86.2 kg)  08/13/21 187 lb (84.8 kg)    Physical Exam Vitals and nursing note reviewed.  Constitutional:      General: He is not in acute distress.    Appearance: He is well-developed. He is not diaphoretic.  Eyes:     General: No scleral icterus.    Conjunctiva/sclera: Conjunctivae normal.  Neck:     Thyroid: No thyromegaly.  Cardiovascular:     Rate and Rhythm: Normal rate. Rhythm irregular.     Heart sounds: Normal heart sounds. No murmur heard. Pulmonary:     Effort: Pulmonary effort is normal. No respiratory distress.     Breath sounds: Normal breath sounds. No wheezing.  Musculoskeletal:        General: Normal range of motion.     Cervical back: Neck supple.  Lymphadenopathy:     Cervical: No cervical adenopathy.  Skin:    General: Skin is warm and dry.     Findings: No rash.  Neurological:     Mental Status: He is alert and oriented to person, place, and time.     Coordination: Coordination normal.  Psychiatric:        Behavior: Behavior normal.      Assessment & Plan:   Problem List Items Addressed This Visit       Cardiovascular and Mediastinum   Essential hypertension   Atrial fibrillation, persistent (HCC)     Other   Dyslipidemia   Prediabetes   Long term current use of anticoagulant therapy - Primary   Relevant Orders   CoaguChek XS/INR Waived    Description   continue weekly dose by taking 1  tablet every day except for Mondays and Thursdays take 1/2 tablet  INR today is  2.3 (goal is 2-3)    Recheck in 6-8 weeks   Patient blood pressure slightly elevated, he will keep close eye on it at home over the next few weeks and bring some numbers back to [place] and will possibly increase the diltiazem in the future but for now we will monitor closely at home.  Follow up plan: Return if symptoms worsen or fail to improve, for Hypertension and A. fib recheck in 6  to 8 weeks.  Counseling provided for all of the vaccine components Orders Placed This Encounter  Procedures   CoaguChek XS/INR [place] Traevion Poehler, MD [facility] 10/15/2021, 11:06 AM

## 2021-10-31 ENCOUNTER — Ambulatory Visit (INDEPENDENT_AMBULATORY_CARE_PROVIDER_SITE_OTHER): Payer: Medicare Other | Admitting: Licensed Clinical Social Worker

## 2021-10-31 DIAGNOSIS — M159 Polyosteoarthritis, unspecified: Secondary | ICD-10-CM

## 2021-10-31 DIAGNOSIS — I509 Heart failure, unspecified: Secondary | ICD-10-CM

## 2021-10-31 DIAGNOSIS — I2510 Atherosclerotic heart disease of native coronary artery without angina pectoris: Secondary | ICD-10-CM

## 2021-10-31 DIAGNOSIS — K219 Gastro-esophageal reflux disease without esophagitis: Secondary | ICD-10-CM

## 2021-10-31 DIAGNOSIS — I10 Essential (primary) hypertension: Secondary | ICD-10-CM

## 2021-10-31 DIAGNOSIS — N400 Enlarged prostate without lower urinary tract symptoms: Secondary | ICD-10-CM

## 2021-10-31 DIAGNOSIS — I4819 Other persistent atrial fibrillation: Secondary | ICD-10-CM

## 2021-10-31 NOTE — Chronic Care Management (AMB) (Signed)
Chronic Care Management    Clinical Social Work Note  10/31/2021 Name: [name] MRN: [id number] DOB: Sep 22, 1932  [name] is a 88 y.o. year old male who is a primary care patient of [name], Fransisca Kaufmann, MD. The CCM team was consulted to assist the patient with chronic disease management and/or care coordination needs related to: [name] .   Engaged with patient by telephone for follow up visit in response to provider referral for social work chronic care management and care coordination services.   Consent to Services:  The patient was given information about Chronic Care Management services, agreed to services, and gave verbal consent prior to initiation of services.  Please see initial visit note for detailed documentation.   Patient agreed to services and consent obtained.   Assessment: Review of patient past medical history, allergies, medications, and health status, including review of relevant consultants reports was performed today as part of a comprehensive evaluation and provision of chronic care management and care coordination services.     SDOH (Social Determinants of Health) assessments and interventions performed:  SDOH Interventions    Flowsheet Row Most Recent Value  SDOH Interventions   Physical Activity Interventions Other (Comments)  [walking challenges. he has to take rest breaks when walking]  Stress Interventions Provide Counseling  [client has some stress related to managing medical needs. he gets fatigued easily. He has to take rest breaks when walking.]  Depression Interventions/Treatment  --  [informed client of LCSW support and of [name] support]        Advanced Directives Status: See Vynca application for related entries.  CCM Care Plan  Allergies  Allergen Reactions   Penicillins Hives and Rash    Has patient had a PCN reaction causing immediate rash, facial/tongue/throat swelling, SOB or lightheadedness with hypotension: Yes Has  patient had a PCN reaction causing severe rash involving mucus membranes or skin necrosis: Yes Has patient had a PCN reaction that required hospitalization: No Has patient had a PCN reaction occurring within the last 10 years: No If all of the above answers are "NO", then may proceed with Cephalosporin use.    Hct [Hydrochlorothiazide] Other (See Comments)    hyper   Statins Other (See Comments)    Myalgia, tolerates low dosages of lovastatin     Outpatient Encounter Medications as of 10/31/2021  Medication Sig   acetaminophen (TYLENOL) 500 MG tablet Take 1,000 mg by mouth every 6 (six) hours as needed for moderate pain.   Cholecalciferol (VITAMIN D3) 5000 units CAPS Take 5,000 Units by mouth once a week.    diltiazem (CARDIZEM CD) 120 MG 24 hr capsule Take one capsule daily   ezetimibe (ZETIA) 10 MG tablet TAKE ONE (1) TABLET EACH DAY   finasteride (PROSCAR) 5 MG tablet Take one tablet each day   fluticasone (FLONASE) 50 MCG/ACT nasal spray Place 1 spray into both nostrils daily as needed for allergies or rhinitis.   furosemide (LASIX) 20 MG tablet TAKE ONE (1) TABLET EACH DAY   lovastatin (MEVACOR) 20 MG tablet TAKE 1/2 TABLET EVERY OTHER DAY   metoprolol tartrate (LOPRESSOR) 100 MG tablet Take 1 tablet (100 mg total) by mouth 2 (two) times daily.   Polyethyl Glycol-Propyl Glycol (SYSTANE OP) Place 1 drop into both eyes 3 (three) times daily as needed (dry eyes).    warfarin (COUMADIN) 5 MG tablet TAKE 1 TABLET ON SAT, SUN, TUES, & THURSTAKE 1/2 TABLET ON MON, WED, & FRIDAY   No facility-administered  encounter medications on file as of 10/31/2021.    Patient Active Problem List   Diagnosis Date Noted   Dilated cardiomyopathy ([place]) 09/16/2021   Prediabetes 08/25/2019   CHF (congestive heart failure) ([place])    H/O total hip arthroplasty, left 05/04/2018   Dyslipidemia 12/16/2017   Stenosis of left carotid artery 12/16/2017   Peripheral vascular insufficiency ([place]) 11/05/2017    Primary osteoarthritis involving multiple joints 11/05/2017   Vitamin D deficiency 11/05/2017   Primary osteoarthritis of left knee 11/05/2017   Left-sided carotid artery disease ([place]) 06/02/2017   Coronary artery disease involving native coronary artery of native heart without angina pectoris 44/81/8563   Metabolic syndrome 14/97/0263   [name] with elevated PSA 01/21/2015   Cataract of both eyes 05/25/2014   Long term current use of anticoagulant therapy 02/14/2013   Essential hypertension 02/25/2009   Coronary atherosclerosis 02/25/2009   Atrial fibrillation, persistent ([place]) 02/25/2009   GERD 02/25/2009    Conditions to be addressed/monitored: monitor [name]  Care Plan : LCSW care plan  Updates made by Katha Cabal, LCSW since 10/31/2021 12:00 AM     Problem: Coping Skills (General Plan of Care)      Goal: Coping Skills Enhanced;Complete ADLs daily , as able   Start Date: 10/31/2021  Expected End Date: 01/27/2022  This Visit's Progress: On track  Recent Progress: On track  Priority: Medium  Note:   Current barriers:   Patient in need of assistance with connecting to community resources for possible help in [name] Patient is unable to independently navigate community resource options without care coordination support Mobility issues Decreased energy  Clinical Goals:  patient will work with SW in next 30 days to address concerns related to ADLs completion for client  Patient will communicate in next 30 days with [name] or LCSW as needed for CCM support  Clinical Interventions:  Collaboration with [name], Fransisca Kaufmann, MD regarding development and update of comprehensive plan of care as evidenced by provider attestation and co-signature Reviewed pain issues of client.  Reviewed appetite of client. Client said he is eating well. Discussed sleeping issues of client. Client said he is sleeping well Discussed vision of client. He  said he does have glasses . He said he is scheduled for an eye exam on 11/15/21. Reviewed upcoming medical appointments for client. He said he has an appointment with Dr. [name] Parisian on 11/24/21. Reviewed transport needs. Kendyn said he drives himself to needed appointments. He drives himself to grocery store, pharmacy, and drives as needed to complete errands. Discussed energy level of client. He said he sometimes has reduced energy and has to take rest breaks Discussed relaxation techniques (likes to work in his garden,likes to [employer] yard and do outdoor activities) Reviewed family support for client. He said his spouse is supportive. He said his daughter is supportive Encouraged client or [name] to call [name] as needed for nursing support for client Reviewed ADLs completion of client  Patient Coping Skills/Strengths: Has support from his spouse, Juanantonio Stolar Does some ADLs on his own Attends scheduled medical appointments  Patient Deficits: Needs  some help with completing certain ADLs Some mobility challenges  Patient Goals:  Attend scheduled medical appointments in next 30 days Talk regularly with spouse, in next 30 days, about client needs Take medications as prescribed in next 30 days Call [facility] as needed in next 30 days for nursing support for client  Follow Up Plan: LCSW to call client  or his spouse on 12/25/21 at  2:00 PM to assess client needs       Norva Riffle.Rayner Erman MSW, [place] Holiday representative [facility] Care Management [phone]

## 2021-10-31 NOTE — Patient Instructions (Addendum)
Visit Information  Patient Goals:  Protect My Health (Patient).  Complete ADLs daily, as able  Timeframe:  Short-Term Goal Priority:  Medium Progress: On Track Start Date:       10/31/21                  Expected End Date:       01/27/22            Follow Up Date 12/25/21 at 2:00 PM   Protect My Health (Patient) Complete ADLs daily, as able   Why is this important?   Screening tests can find diseases early when they are easier to treat.  Your doctor or nurse will talk with you about which tests are important for you.  Getting shots for common diseases like the flu and shingles will help prevent them.     Patient Coping Skills/Strengths: Has support from his spouse, Mattheus Rauls Does some ADLs on his own Attends scheduled medical appointments  Patient Deficits: Needs  some help with completing certain ADLs Some mobility challenges  Patient Goals:  Attend scheduled medical appointments in next 30 days Talk regularly with spouse, in next 30 days, about client needs Take medications as prescribed in next 30 days Call [facility] as needed in next 30 days for nursing support for client  Follow Up Plan: LCSW to call client or his spouse on 12/25/21 at 2:00 PM to assess client needs    Norva Riffle.Garrick Midgley MSW, [place] Holiday representative [facility] Care Management [phone]

## 2021-11-11 DIAGNOSIS — I4819 Other persistent atrial fibrillation: Secondary | ICD-10-CM

## 2021-11-11 DIAGNOSIS — I2510 Atherosclerotic heart disease of native coronary artery without angina pectoris: Secondary | ICD-10-CM

## 2021-11-11 DIAGNOSIS — R972 Elevated prostate specific antigen [PSA]: Secondary | ICD-10-CM

## 2021-11-11 DIAGNOSIS — I509 Heart failure, unspecified: Secondary | ICD-10-CM

## 2021-11-11 DIAGNOSIS — I10 Essential (primary) hypertension: Secondary | ICD-10-CM

## 2021-11-11 DIAGNOSIS — M159 Polyosteoarthritis, unspecified: Secondary | ICD-10-CM

## 2021-11-11 DIAGNOSIS — N400 Enlarged prostate without lower urinary tract symptoms: Secondary | ICD-10-CM

## 2021-11-24 DIAGNOSIS — H02834 Dermatochalasis of left upper eyelid: Secondary | ICD-10-CM | POA: Diagnosis not present

## 2021-11-24 DIAGNOSIS — H02831 Dermatochalasis of right upper eyelid: Secondary | ICD-10-CM | POA: Diagnosis not present

## 2021-11-24 DIAGNOSIS — H26491 Other secondary cataract, right eye: Secondary | ICD-10-CM | POA: Diagnosis not present

## 2021-11-24 DIAGNOSIS — Z961 Presence of intraocular lens: Secondary | ICD-10-CM | POA: Diagnosis not present

## 2021-12-03 DIAGNOSIS — H26491 Other secondary cataract, right eye: Secondary | ICD-10-CM | POA: Diagnosis not present

## 2021-12-11 ENCOUNTER — Ambulatory Visit (INDEPENDENT_AMBULATORY_CARE_PROVIDER_SITE_OTHER): Payer: Medicare Other

## 2021-12-11 VITALS — Wt 178.0 lb

## 2021-12-11 DIAGNOSIS — Z0000 Encounter for general adult medical examination without abnormal findings: Secondary | ICD-10-CM

## 2021-12-11 NOTE — Progress Notes (Signed)
Subjective:   [name] is a 88 y.o. male who presents for Medicare Annual/Subsequent preventive examination.  Virtual Visit via Telephone Note  I connected with  [name] on 12/11/21 at 12:00 PM EST by telephone and verified that I am speaking with the correct person using two identifiers.  Location: Patient: Home Provider: [facility] Persons participating in the virtual visit: patient/Nurse Health Advisor   I discussed the limitations, risks, security and privacy concerns of performing an evaluation and management service by telephone and the availability of in person appointments. The patient expressed understanding and agreed to proceed.  Interactive audio and video telecommunications were attempted between this nurse and patient, however failed, due to patient having technical difficulties OR patient did not have access to video capability.  We continued and completed visit with audio only.  Some vital signs may be absent or patient reported.   Rhayne Chatwin E Mccall Lomax, LPN   Review of Systems     Cardiac Risk Factors include: advanced age (>45men, >92 women);male gender;dyslipidemia;hypertension;Other (see comment), Risk factor comments: A.fib, [name], CHF     Objective:    Today's Vitals   12/11/21 1522  Weight: 178 lb (80.7 kg)   Body mass index is 28.3 kg/m.  Advanced Directives 12/11/2021 12/10/2020 11/21/2019 08/18/2019 05/04/2018 05/04/2018 04/25/2018  Does Patient Have a Medical Advance Directive? Yes No No No No No No  Type of Paramedic of [place];Living will - - - - - -  Copy of [place] in Chart? No - copy requested - - - - - -  Would patient like information on creating a medical advance directive? - No - Patient declined No - Patient declined No - Patient declined No - Patient declined No - Patient declined No - Patient declined    Current Medications (verified) Outpatient Encounter Medications as of 12/11/2021  Medication Sig    acetaminophen (TYLENOL) 500 MG tablet Take 1,000 mg by mouth every 6 (six) hours as needed for moderate pain.   Cholecalciferol (VITAMIN D3) 5000 units CAPS Take 5,000 Units by mouth once a week.    diltiazem (CARDIZEM CD) 120 MG 24 hr capsule Take one capsule daily   ezetimibe (ZETIA) 10 MG tablet TAKE ONE (1) TABLET EACH DAY   finasteride (PROSCAR) 5 MG tablet Take one tablet each day   fluticasone (FLONASE) 50 MCG/ACT nasal spray Place 1 spray into both nostrils daily as needed for allergies or rhinitis.   furosemide (LASIX) 20 MG tablet TAKE ONE (1) TABLET EACH DAY   lovastatin (MEVACOR) 20 MG tablet TAKE 1/2 TABLET EVERY OTHER DAY   metoprolol tartrate (LOPRESSOR) 100 MG tablet Take 1 tablet (100 mg total) by mouth 2 (two) times daily.   Polyethyl Glycol-Propyl Glycol (SYSTANE OP) Place 1 drop into both eyes 3 (three) times daily as needed (dry eyes).    warfarin (COUMADIN) 5 MG tablet TAKE 1 TABLET ON SAT, SUN, TUES, & THURSTAKE 1/2 TABLET ON MON, WED, & FRIDAY   No facility-administered encounter medications on file as of 12/11/2021.    Allergies (verified) Penicillins, Hct [hydrochlorothiazide], and Statins   History: Past Medical History:  Diagnosis Date   Anemia    Atrial fibrillation (HCC)    Cataract    Cellulitis    In past   Complication of anesthesia    HARD TIME WAKING; CAUSES MY BP TO GO UP    Coronary artery disease    5 bypasses   DJD (degenerative joint  disease)    Ejection fraction < 50%    Mildly reduced, 40% by echo   GERD (gastroesophageal reflux disease)    Hyperlipidemia    Hypertension    Persistent atrial fibrillation ([place])    Prostate hypertrophy    on CT scan 09/2014   PUD (peptic ulcer disease)    RESOLVED    Skin cancer of eyelid    Resected   Past Surgical History:  Procedure Laterality Date   APPENDECTOMY     BYPASS GRAFT  2005   CATARACT EXTRACTION W/PHACO Left 07/22/2015   Procedure: CATARACT EXTRACTION PHACO AND INTRAOCULAR LENS  PLACEMENT LEFT EYE CDE=8.14;  Surgeon: Tonny Branch, MD;  Location: [facility];  Service: Ophthalmology;  Laterality: Left;   CATARACT EXTRACTION W/PHACO Right 08/18/2019   Procedure: CATARACT EXTRACTION PHACO AND INTRAOCULAR LENS PLACEMENT (IOC);  Surgeon: Baruch Goldmann, MD;  Location: [facility];  Service: Ophthalmology;  Laterality: Right;  CDE: 9.74   CORONARY ARTERY BYPASS GRAFT  4/05   LIMA to LAD, SVG to PDA, SVG to ramus intermediate   EYE SURGERY  07/2015   EYELID CARCINOMA EXCISION     Skin cancer resected   Heart bypass  2005   TOTAL HIP ARTHROPLASTY     Right   TOTAL HIP ARTHROPLASTY Left 05/04/2018   Procedure: LEFT TOTAL HIP ARTHROPLASTY;  Surgeon: [name], MD;  Location: [facility];  Service: Orthopedics;  Laterality: Left;   TOTAL KNEE ARTHROPLASTY     Right   Family History  Problem Relation Age of Onset   Stroke Mother    Stroke Father    Hypertension Father    Early death Brother        41 months old   Cancer Brother        lung   Stroke Brother        heat   Social History   Socioeconomic History   Marital status: Married    Spouse name: [name]   Number of children: 1   Years of education: 10   Highest education level: 10th grade  Occupational History   Occupation: retired  Tobacco Use   Smoking status: Former    Packs/day: 2.00    Years: 5.00    Pack years: 10.00    Types: Cigarettes    Quit date: 12/23/1963    Years since quitting: 58.0   Smokeless tobacco: Never  Vaping Use   Vaping Use: Never used  Substance and Sexual Activity   Alcohol use: No   Drug use: No   Sexual activity: Yes  Other Topics Concern   Not on file  Social History Narrative   Lives in a split level home.   Social Determinants of Health   Financial Resource Strain: Low Risk    Difficulty of Paying Living Expenses: Not hard at all  Food Insecurity: No Food Insecurity   Worried About Charity fundraiser in the Last Year: Never true   [place] in the Last Year:  Never true  Transportation Needs: No Transportation Needs   Lack of Transportation (Medical): No   Lack of Transportation (Non-Medical): No  Physical Activity: Insufficiently Active   Days of Exercise per Week: 7 days   Minutes of Exercise per Session: 20 min  Stress: No Stress Concern Present   Feeling of Stress : Only a little  Social Connections: Moderately Isolated   Frequency of Communication with Friends and Family: More than three times a week   Frequency  of Social Gatherings with Friends and Family: More than three times a week   Attends Religious Services: Never   Marine scientist or Organizations: No   Attends Music therapist: Never   Marital Status: Married    Tobacco Counseling Counseling given: Not Answered   Clinical Intake:  Pre-visit preparation completed: Yes  Pain : No/denies pain     BMI - recorded: 28.3 Nutritional Status: BMI 25 -29 Overweight Nutritional Risks: None Diabetes: No  How often do you need to have someone help you when you read instructions, pamphlets, or other written materials from your doctor or pharmacy?: 1 - Never  Diabetic? no  Interpreter Needed?: No  Information entered by :: Yojan Paskett, LPN   Activities of Daily Living In your present state of health, do you have any difficulty performing the following activities: 12/11/2021  Hearing? N  Vision? N  Difficulty concentrating or making decisions? Y  Walking or climbing stairs? N  Dressing or bathing? N  Doing errands, shopping? N  Preparing Food and eating ? N  Using the Toilet? N  In the past six months, have you accidently leaked urine? N  Do you have problems with loss of bowel control? N  Managing your Medications? N  Managing your Finances? N  Housekeeping or managing your Housekeeping? N  Some recent data might be hidden    Patient Care Team: [name], Fransisca Kaufmann, MD as PCP - General (Family Medicine) Minus Breeding, MD as PCP - Cardiology  (Cardiology) Irine Seal, MD as Attending Physician (Urology) Minus Breeding, MD as Consulting Physician (Cardiology) Rogene Houston, MD as Consulting Physician (Gastroenterology) Tonny Branch, MD as Consulting Physician (Ophthalmology) [name], MD as Consulting Physician (Orthopedic Surgery) Shea Evans Derrek Gu as [place] Management (Licensed Clinical Social Worker)  Indicate any recent [place] you may have received from other than [facility] providers in the past year (date may be approximate).     Assessment:   This is a routine wellness examination for [name].  Hearing/Vision screen Hearing Screening - Comments:: Denies hearing difficulties   Vision Screening - Comments:: Wears rx glasses - up to date with routine eye exams with [facility] - had recent surgery with Dr Manuella Ghazi  Dietary issues and exercise activities discussed: Current Exercise Habits: Home exercise routine, Type of exercise: walking, Time (Minutes): 20, Frequency (Times/Week): 7, Weekly Exercise (Minutes/Week): 140, Intensity: Mild, Exercise limited by: orthopedic condition(s);cardiac condition(s)   Goals Addressed               This Visit's Progress     AWV   On track     12/10/2020 AWV Goal: Fall Prevention  Over the next year, patient will decrease their risk for falls by: Using assistive devices, such as a cane or walker, as needed Identifying fall risks within their home and correcting them by: Removing throw rugs Adding handrails to stairs or ramps Removing clutter and keeping a clear pathway throughout the home Increasing light, especially at night Adding shower handles/bars Raising toilet seat Identifying potential personal risk factors for falls: Medication side effects Incontinence/urgency Vestibular dysfunction Hearing loss Musculoskeletal disorders Neurological disorders Orthostatic hypotension        Client will talk with LCSW in next 30 days  to discuss [name] and completion of daily activities (pt-stated)   On track     CARE PLAN ENTRY  Current Barriers:  Pain issues in [name],  Atrial Fibrillation, GERD, OA, [name], [name], CHF  Clinical Social Work Clinical Goal(s):  LCSW will call client in next 30 days to talk with [name] and about his completion of daily activities  Interventions: Talked with client about CCM program support Talked with client about ambulation challenges Talked with client about vision challenges of client Talked with client about transport needs of client Talked with client about relaxation techniques of client (likes to work outdoors, care for their yard, visit with family and friends) Talked with client about upcoming client medical appointments Talked with client about past history of heart issues Talked with client about his energy level and occasional decreased energy on exertion Talked with client about occasional shortness of breath Talked with client about his use of grab bars in the home  Patient Self Care Activities:  Completes ADLs independently Drives to appointments and to complete errands  Patient Self Care Deficits:  Pain issues in hips  Initial goal documentation        Depression Screen PHQ 2/9 Scores 12/11/2021 10/31/2021 10/15/2021 08/13/2021 05/28/2021 04/25/2021 03/31/2021  PHQ - 2 Score 0 1 0 0 0 0 0  PHQ- 9 Score 4 5 - - 3 3 -    Fall Risk Fall Risk  12/11/2021 10/15/2021 08/13/2021 05/28/2021 03/31/2021  Falls in the past year? 0 0 0 0 0  Number falls in past yr: 0 - - - -  Injury with Fall? 0 - - - -  Risk for fall due to : Orthopedic patient - - - -  Follow up Falls prevention discussed - - - -    FALL RISK PREVENTION PERTAINING TO THE HOME:  Any stairs in or around the home? Yes  If so, are there any without handrails? No  Home free of loose throw rugs in walkways, pet beds, electrical cords,  etc? Yes  Adequate lighting in your home to reduce risk of falls? Yes   ASSISTIVE DEVICES UTILIZED TO PREVENT FALLS:  Life alert? No  Use of a cane, walker or w/c? No  Grab bars in the bathroom? Yes  Shower chair or bench in shower? Yes  Elevated toilet seat or a handicapped toilet? Yes   TIMED UP AND GO:  Was the test performed? No . Telephonic visit  Cognitive Function: MMSE - Mini Mental State Exam 08/13/2016 07/25/2015  Not completed: Refused -  Orientation to time - 5  Orientation to Place - 5  Registration - 3  Attention/ Calculation - 3  Recall - 3  Language- name 2 objects - 2  Language- repeat - 1  Language- follow 3 step command - 3  Language- read & follow direction - 1  Write a sentence - 1  Copy design - 1  Total score - 28     6CIT Screen 12/11/2021 12/10/2020 11/21/2019  What Year? 0 points 0 points 0 points  What month? 0 points 0 points 0 points  What time? 0 points 0 points 0 points  Count back from 20 0 points 0 points 0 points  Months in reverse 2 points 2 points 0 points  Repeat phrase 4 points 2 points 4 points  Total Score 6 4 4     Immunizations Immunization History  Administered Date(s) Administered   Fluad Quad(high Dose 65+) 07/19/2019, 07/19/2020, 08/13/2021   Influenza, High Dose Seasonal PF 07/07/2016, 07/21/2017, 07/08/2018   Influenza,inj,Quad PF,6+ Mos 07/17/2013, 07/05/2014, 07/17/2015   Moderna Sars-Covid-2 Vaccination 12/04/2019, 01/03/2020, 08/29/2020, 01/30/2021, 07/17/2021  Pneumococcal Conjugate-13 10/30/2013   Pneumococcal Polysaccharide-23 05/12/2010   Tdap 10/13/2011    TDAP status: Up to date  Flu Vaccine status: Up to date  Pneumococcal vaccine status: Up to date  Covid-19 vaccine status: Completed vaccines  Qualifies for Shingles Vaccine? Yes   Zostavax completed No   Shingrix Completed?: No.    Education has been provided regarding the importance of this vaccine. Patient has been advised to call insurance company  to determine out of pocket expense if they have not yet received this vaccine. Advised may also receive vaccine at local pharmacy or Health Dept. Verbalized acceptance and understanding.  Screening Tests Health Maintenance  Topic Date Due   Zoster Vaccines- Shingrix (1 of 2) 01/13/2022 (Originally 08/31/1982)   TETANUS/TDAP  10/15/2022 (Originally 10/12/2021)   Pneumonia Vaccine 26+ Years old  Completed   INFLUENZA VACCINE  Completed   COVID-19 Vaccine  Completed   HPV VACCINES  Aged Out    Health Maintenance  There are no preventive care reminders to display for this patient.   Colorectal cancer screening: No longer required.   Lung Cancer Screening: (Low Dose CT Chest recommended if Age 62-80 years, 30 pack-year currently smoking OR have quit w/in 15years.) does not qualify.   Additional Screening:  Hepatitis C Screening: does not qualify  Vision Screening: Recommended annual ophthalmology exams for early detection of glaucoma and other disorders of the eye. Is the patient up to date with their annual eye exam?  Yes  Who is the provider or what is the name of the office in which the patient attends annual eye exams? [facility] If pt is not established with a provider, would they like to be referred to a provider to establish care? No .   Dental Screening: Recommended annual dental exams for proper oral hygiene  Community Resource Referral / Chronic Care Management: CRR required this visit?  No   CCM required this visit?  No      Plan:     I have personally reviewed and noted the following in the patients chart:   Medical and social history Use of alcohol, tobacco or illicit drugs  Current medications and supplements including opioid prescriptions. Patient is not currently taking opioid prescriptions. Functional ability and status Nutritional status Physical activity Advanced directives List of other physicians Hospitalizations, surgeries, and ER visits in  previous 12 months Vitals Screenings to include cognitive, depression, and falls Referrals and appointments  In addition, I have reviewed and discussed with patient certain preventive protocols, quality metrics, and best practice recommendations. A written personalized care plan for preventive services as well as general preventive health recommendations were provided to patient.     Sandrea Hammond, LPN   06/18/6733   Nurse Notes: None

## 2021-12-11 NOTE — Patient Instructions (Signed)
[name] , Thank you for taking time to come for your Medicare Wellness Visit. I appreciate your ongoing commitment to your health goals. Please review the following plan we discussed and let me know if I can assist you in the future.   Screening recommendations/referrals: Colonoscopy: No longer required Recommended yearly ophthalmology/optometry visit for glaucoma screening and checkup Recommended yearly dental visit for hygiene and checkup  Vaccinations: Influenza vaccine: Done 08/13/2021 - Repeat annually  Pneumococcal vaccine: Done 05/12/2010 & 10/30/2013 Tdap vaccine: Done 2013 - Repeat in 10 years *this year Shingles vaccine: Due - recommend 2 doses 2-6 months apart - this is over 90% effective   Covid-19: Done 12/04/2019, 01/03/2020, 08/29/2020, 01/30/2021, 07/17/2021  Advanced directives: Please bring a copy of your health care power of attorney and living will to the office to be added to your chart at your convenience.   Conditions/risks identified: Aim for 30 minutes of exercise or walking, 6-8 glasses of water, and 5 servings of fruits and vegetables each day.   Next appointment: Follow up in one year for your annual wellness visit.   Preventive Care 86 Years and Older, Male  Preventive care refers to lifestyle choices and visits with your health care provider that can promote health and wellness. What does preventive care include? A yearly physical exam. This is also called an annual well check. Dental exams once or twice a year. Routine eye exams. Ask your health care provider how often you should have your eyes checked. Personal lifestyle choices, including: Daily care of your teeth and gums. Regular physical activity. Eating a healthy diet. Avoiding tobacco and drug use. Limiting alcohol use. Practicing safe sex. Taking low doses of aspirin every day. Taking vitamin and mineral supplements as recommended by your health care provider. What happens during an annual well  check? The services and screenings done by your health care provider during your annual well check will depend on your age, overall health, lifestyle risk factors, and family history of disease. Counseling  Your health care provider may ask you questions about your: Alcohol use. Tobacco use. Drug use. Emotional well-being. Home and relationship well-being. Sexual activity. Eating habits. History of falls. Memory and ability to understand (cognition). Work and work Statistician. Screening  You may have the following tests or measurements: Height, weight, and BMI. Blood pressure. Lipid and cholesterol levels. These may be checked every 5 years, or more frequently if you are over 20 years old. Skin check. Lung cancer screening. You may have this screening every year starting at age 75 if you have a 30-pack-year history of smoking and currently smoke or have quit within the past 15 years. Fecal occult blood test (FOBT) of the stool. You may have this test every year starting at age 67. Flexible sigmoidoscopy or colonoscopy. You may have a sigmoidoscopy every 5 years or a colonoscopy every 10 years starting at age 7. Prostate cancer screening. Recommendations will vary depending on your family history and other risks. Hepatitis C blood test. Hepatitis B blood test. Sexually transmitted disease (STD) testing. Diabetes screening. This is done by checking your blood sugar (glucose) after you have not eaten for a while (fasting). You may have this done every 1-3 years. Abdominal aortic aneurysm (AAA) screening. You may need this if you are a current or former smoker. Osteoporosis. You may be screened starting at age 59 if you are at high risk. Talk with your health care provider about your test results, treatment options, and if necessary, the  need for more tests. Vaccines  Your health care provider may recommend certain vaccines, such as: Influenza vaccine. This is recommended every  year. Tetanus, diphtheria, and acellular pertussis (Tdap, Td) vaccine. You may need a Td booster every 10 years. Zoster vaccine. You may need this after age 28. Pneumococcal 13-valent conjugate (PCV13) vaccine. One dose is recommended after age 68. Pneumococcal polysaccharide (PPSV23) vaccine. One dose is recommended after age 80. Talk to your health care provider about which screenings and vaccines you need and how often you need them. This information is not intended to replace advice given to you by your health care provider. Make sure you discuss any questions you have with your health care provider. Document Released: 10/25/2015 Document Revised: 06/17/2016 Document Reviewed: 07/30/2015 Elsevier Interactive Patient Education  2017 [place] Prevention in the Home Falls can cause injuries. They can happen to people of all ages. There are many things you can do to make your home safe and to help prevent falls. What can I do on the outside of my home? Regularly fix the edges of walkways and driveways and fix any cracks. Remove anything that might make you trip as you walk through a door, such as a raised step or threshold. Trim any bushes or trees on the path to your home. Use bright outdoor lighting. Clear any walking paths of anything that might make someone trip, such as rocks or tools. Regularly check to see if handrails are loose or broken. Make sure that both sides of any steps have handrails. Any raised decks and porches should have guardrails on the edges. Have any leaves, snow, or ice cleared regularly. Use sand or salt on walking paths during winter. Clean up any spills in your garage right away. This includes oil or grease spills. What can I do in the bathroom? Use night lights. Install grab bars by the toilet and in the tub and shower. Do not use towel bars as grab bars. Use non-skid mats or decals in the tub or shower. If you need to sit down in the shower, use a  plastic, non-slip stool. Keep the floor dry. Clean up any water that spills on the floor as soon as it happens. Remove soap buildup in the tub or shower regularly. Attach bath mats securely with double-sided non-slip rug tape. Do not have throw rugs and other things on the floor that can make you trip. What can I do in the bedroom? Use night lights. Make sure that you have a light by your bed that is easy to reach. Do not use any sheets or blankets that are too big for your bed. They should not hang down onto the floor. Have a firm chair that has side arms. You can use this for support while you get dressed. Do not have throw rugs and other things on the floor that can make you trip. What can I do in the kitchen? Clean up any spills right away. Avoid walking on wet floors. Keep items that you use a lot in easy-to-reach places. If you need to reach something above you, use a strong step stool that has a grab bar. Keep electrical cords out of the way. Do not use floor polish or wax that makes floors slippery. If you must use wax, use non-skid floor wax. Do not have throw rugs and other things on the floor that can make you trip. What can I do with my stairs? Do not leave any items on the  stairs. Make sure that there are handrails on both sides of the stairs and use them. Fix handrails that are broken or loose. Make sure that handrails are as long as the stairways. Check any carpeting to make sure that it is firmly attached to the stairs. Fix any carpet that is loose or worn. Avoid having throw rugs at the top or bottom of the stairs. If you do have throw rugs, attach them to the floor with carpet tape. Make sure that you have a light switch at the top of the stairs and the bottom of the stairs. If you do not have them, ask someone to add them for you. What else can I do to help prevent falls? Wear shoes that: Do not have high heels. Have rubber bottoms. Are comfortable and fit you  well. Are closed at the toe. Do not wear sandals. If you use a stepladder: Make sure that it is fully opened. Do not climb a closed stepladder. Make sure that both sides of the stepladder are locked into place. Ask someone to hold it for you, if possible. Clearly mark and make sure that you can see: Any grab bars or handrails. First and last steps. Where the edge of each step is. Use tools that help you move around (mobility aids) if they are needed. These include: Canes. Walkers. Scooters. Crutches. Turn on the lights when you go into a dark area. Replace any light bulbs as soon as they burn out. Set up your furniture so you have a clear path. Avoid moving your furniture around. If any of your floors are uneven, fix them. If there are any pets around you, be aware of where they are. Review your medicines with your doctor. Some medicines can make you feel dizzy. This can increase your chance of falling. Ask your doctor what other things that you can do to help prevent falls. This information is not intended to replace advice given to you by your health care provider. Make sure you discuss any questions you have with your health care provider. Document Released: 07/25/2009 Document Revised: 03/05/2016 Document Reviewed: 11/02/2014 Elsevier Interactive Patient Education  2017 [employer].

## 2021-12-16 ENCOUNTER — Other Ambulatory Visit: Payer: Self-pay | Admitting: Family Medicine

## 2021-12-18 ENCOUNTER — Ambulatory Visit (INDEPENDENT_AMBULATORY_CARE_PROVIDER_SITE_OTHER): Payer: Medicare Other | Admitting: Family Medicine

## 2021-12-18 ENCOUNTER — Encounter: Payer: Self-pay | Admitting: Family Medicine

## 2021-12-18 VITALS — BP 143/79 | HR 103 | Temp 97.2°F | Ht 66.0 in | Wt 188.4 lb

## 2021-12-18 DIAGNOSIS — I4819 Other persistent atrial fibrillation: Secondary | ICD-10-CM | POA: Diagnosis not present

## 2021-12-18 DIAGNOSIS — Z7901 Long term (current) use of anticoagulants: Secondary | ICD-10-CM

## 2021-12-18 LAB — COAGUCHEK XS/INR WAIVED
INR: 2.2 — ABNORMAL HIGH (ref 0.9–1.1)
Prothrombin Time: 26.6 s

## 2021-12-18 MED ORDER — EZETIMIBE 10 MG PO TABS: 10.0000 mg | ORAL_TABLET | Freq: Every day | ORAL | 3 refills | Status: DC

## 2021-12-18 MED ORDER — WARFARIN SODIUM 5 MG PO TABS: ORAL_TABLET | ORAL | 3 refills | Status: DC

## 2021-12-18 MED ORDER — FUROSEMIDE 20 MG PO TABS: 20.0000 mg | ORAL_TABLET | Freq: Every day | ORAL | 3 refills | Status: DC

## 2021-12-18 NOTE — Progress Notes (Signed)
? ?BP (!) 143/79   Pulse (!) 103   Temp (!) 97.2 ?F (36.2 ?C) (Oral)   Ht 5\' 6"  (1.676 m)   Wt 188 lb 6.4 oz (85.5 kg)   SpO2 97%   BMI 30.41 kg/m?   ? ?Subjective:  ? ?Patient ID: [name], male    DOB: Jan 25, 1932, 88 y.o.   MRN: [id number] ? ?HPI: ?[name] is a 88 y.o. male presenting on 12/18/2021 for Gastroesophageal Reflux ? ? ?HPI ?Coumadin recheck ?Target goal: 2.0-3.0 ?Reason on anticoagulation: A-fib ?Patient denies any bruising or bleeding or chest pain or palpitations  ? ?Relevant past medical, surgical, family and social history reviewed and updated as indicated. Interim medical history since our last visit reviewed. ?Allergies and medications reviewed and updated. ? ?Review of Systems  ?Constitutional:  Negative for chills and fever.  ?Eyes:  Negative for visual disturbance.  ?Respiratory:  Negative for shortness of breath and wheezing.   ?Cardiovascular:  Negative for chest pain and leg swelling.  ?Musculoskeletal:  Negative for back pain and gait problem.  ?Skin:  Negative for rash.  ?All other systems reviewed and are negative. ? ?Per HPI unless specifically indicated above ? ? ?Allergies as of 12/18/2021   ? ?   Reactions  ? Penicillins Hives, Rash  ? Has patient had a PCN reaction causing immediate rash, facial/tongue/throat swelling, SOB or lightheadedness with hypotension: Yes ?Has patient had a PCN reaction causing severe rash involving mucus membranes or skin necrosis: Yes ?Has patient had a PCN reaction that required hospitalization: No ?Has patient had a PCN reaction occurring within the last 10 years: No ?If all of the above answers are "NO", then may proceed with Cephalosporin use.  ? Hct [hydrochlorothiazide] Other (See Comments)  ? hyper  ? Statins Other (See Comments)  ? Myalgia, tolerates low dosages of lovastatin   ? ?  ? ?  ?Medication List  ?  ? ?  ? Accurate as of December 18, 2021 10:47 AM. If you have any questions, ask your nurse or doctor.  ?  ?  ? ?  ? ?acetaminophen 500  MG tablet ?Commonly known as: TYLENOL ?Take 1,000 mg by mouth every 6 (six) hours as needed for moderate pain. ?  ?diltiazem 120 MG 24 hr capsule ?Commonly known as: CARDIZEM CD ?Take one capsule daily ?  ?ezetimibe 10 MG tablet ?Commonly known as: ZETIA ?Take 1 tablet (10 mg total) by mouth daily. ?What changed: See the new instructions. ?Changed by: Worthy Rancher, MD ?  ?finasteride 5 MG tablet ?Commonly known as: PROSCAR ?Take one tablet each day ?  ?fluticasone 50 MCG/ACT nasal spray ?Commonly known as: FLONASE ?Place 1 spray into both nostrils daily as needed for allergies or rhinitis. ?  ?furosemide 20 MG tablet ?Commonly known as: LASIX ?Take 1 tablet (20 mg total) by mouth daily. ?What changed: See the new instructions. ?Changed by: Worthy Rancher, MD ?  ?lovastatin 20 MG tablet ?Commonly known as: MEVACOR ?TAKE 1/2 TABLET EVERY OTHER DAY ?  ?metoprolol tartrate 100 MG tablet ?Commonly known as: LOPRESSOR ?Take 1 tablet (100 mg total) by mouth 2 (two) times daily. ?  ?SYSTANE OP ?Place 1 drop into both eyes 3 (three) times daily as needed (dry eyes). ?  ?Vitamin D3 125 MCG (5000 UT) Caps ?Take 5,000 Units by mouth once a week. ?  ?warfarin 5 MG tablet ?Commonly known as: COUMADIN ?Take as directed by the anticoagulation clinic. If you are unsure how  to take this medication, talk to your nurse or doctor. ?Original instructions: TAKE 1 TABLET ON SAT, SUN, TUES, & THURSTAKE 1/2 TABLET ON MON, WED, & FRIDAY ?  ? ?  ? ? ? ?Objective:  ? ?BP (!) 143/79   Pulse (!) 103   Temp (!) 97.2 ?F (36.2 ?C) (Oral)   Ht 5\' 6"  (1.676 m)   Wt 188 lb 6.4 oz (85.5 kg)   SpO2 97%   BMI 30.41 kg/m?   ?Wt Readings from Last 3 Encounters:  ?12/18/21 188 lb 6.4 oz (85.5 kg)  ?12/11/21 178 lb (80.7 kg)  ?10/15/21 187 lb (84.8 kg)  ?  ?Physical Exam ?Vitals and nursing note reviewed.  ?Constitutional:   ?   General: He is not in acute distress. ?   Appearance: He is well-developed. He is not diaphoretic.  ?Eyes:  ?    General: No scleral icterus. ?   Conjunctiva/sclera: Conjunctivae normal.  ?Neck:  ?   Thyroid: No thyromegaly.  ?Cardiovascular:  ?   Rate and Rhythm: Normal rate. Rhythm irregular.  ?   Heart sounds: Normal heart sounds. No murmur heard. ?Pulmonary:  ?   Effort: Pulmonary effort is normal. No respiratory distress.  ?   Breath sounds: Normal breath sounds. No wheezing.  ?Musculoskeletal:     ?   General: Normal range of motion.  ?   Cervical back: Neck supple.  ?Lymphadenopathy:  ?   Cervical: No cervical adenopathy.  ?Skin: ?   General: Skin is warm and dry.  ?   Findings: No rash.  ?Neurological:  ?   Mental Status: He is alert and oriented to person, place, and time.  ?   Coordination: Coordination normal.  ?Psychiatric:     ?   Behavior: Behavior normal.  ? ? ?Description   ?continue weekly dose by taking 1 tablet every day except for Mondays and Thursdays take 1/2 tablet ? ?INR today is  2.2 (goal is 2-3)   ? ?Recheck in 6-8 weeks ?  ? ? ? ?Assessment & Plan:  ? ?Problem List Items Addressed This Visit   ? ?  ? Cardiovascular and Mediastinum  ? Atrial fibrillation, persistent ([place]) - Primary  ? Relevant Medications  ? ezetimibe (ZETIA) 10 MG tablet  ? furosemide (LASIX) 20 MG tablet  ? warfarin (COUMADIN) 5 MG tablet  ? Other Relevant Orders  ? CoaguChek XS/INR Waived  ?  ? Other  ? Long term current use of anticoagulant therapy  ?Continue current medicine.  Looks good, no changes for now. ? ?Follow up plan: ?Return if symptoms worsen or fail to improve, for 6 to 8-week INR recheck. ? ?Counseling provided for all of the vaccine components ?Orders Placed This Encounter  ?Procedures  ? CoaguChek XS/INR Waived  ? ? ?Caryl Pina, MD ?[facility] ?12/18/2021, 10:47 AM ? ? ? ? ?

## 2021-12-25 ENCOUNTER — Telehealth: Payer: Medicare Other

## 2022-01-13 DIAGNOSIS — H02132 Senile ectropion of right lower eyelid: Secondary | ICD-10-CM | POA: Diagnosis not present

## 2022-01-13 DIAGNOSIS — H02135 Senile ectropion of left lower eyelid: Secondary | ICD-10-CM | POA: Diagnosis not present

## 2022-01-13 DIAGNOSIS — H0015 Chalazion left lower eyelid: Secondary | ICD-10-CM | POA: Diagnosis not present

## 2022-01-13 DIAGNOSIS — H04203 Unspecified epiphora, bilateral lacrimal glands: Secondary | ICD-10-CM | POA: Diagnosis not present

## 2022-01-15 ENCOUNTER — Emergency Department (HOSPITAL_COMMUNITY)
Admission: EM | Admit: 2022-01-15 | Discharge: 2022-01-16 | Disposition: A | Discharge status: Home / Self Care | Payer: Medicare Other | Attending: Emergency Medicine | Admitting: Emergency Medicine

## 2022-01-15 ENCOUNTER — Emergency Department (HOSPITAL_COMMUNITY): Payer: Medicare Other

## 2022-01-15 ENCOUNTER — Other Ambulatory Visit: Payer: Self-pay

## 2022-01-15 ENCOUNTER — Encounter (HOSPITAL_COMMUNITY): Payer: Self-pay

## 2022-01-15 DIAGNOSIS — Z20822 Contact with and (suspected) exposure to covid-19: Secondary | ICD-10-CM | POA: Diagnosis not present

## 2022-01-15 DIAGNOSIS — R0981 Nasal congestion: Secondary | ICD-10-CM

## 2022-01-15 DIAGNOSIS — I10 Essential (primary) hypertension: Secondary | ICD-10-CM | POA: Diagnosis not present

## 2022-01-15 DIAGNOSIS — J3489 Other specified disorders of nose and nasal sinuses: Secondary | ICD-10-CM | POA: Diagnosis not present

## 2022-01-15 DIAGNOSIS — Z7901 Long term (current) use of anticoagulants: Secondary | ICD-10-CM | POA: Diagnosis not present

## 2022-01-15 DIAGNOSIS — R0602 Shortness of breath: Secondary | ICD-10-CM | POA: Diagnosis not present

## 2022-01-15 LAB — RESP PANEL BY RT-PCR (FLU A&B, COVID) ARPGX2
Influenza A by PCR: NEGATIVE
Influenza B by PCR: NEGATIVE
SARS Coronavirus 2 by RT PCR: NEGATIVE

## 2022-01-15 MED ORDER — CETIRIZINE-PSEUDOEPHEDRINE ER 5-120 MG PO TB12: ORAL_TABLET | ORAL | 0 refills | Status: DC

## 2022-01-15 MED ORDER — METOPROLOL TARTRATE 50 MG PO TABS
100.0000 mg | ORAL_TABLET | Freq: Once | ORAL | Status: DC
  Filled 2022-01-15: qty 2

## 2022-01-15 MED ORDER — LORATADINE 10 MG PO TABS
10.0000 mg | ORAL_TABLET | Freq: Once | ORAL | Status: AC
  Administered 2022-01-15: 10 mg via ORAL
  Filled 2022-01-15: qty 1

## 2022-01-15 NOTE — ED Triage Notes (Signed)
Pt presents with sob, says his nose was stopped up, has been taking nasal spray and says that has helped, now says feels like his throat has congestion in it from breathing through his mouth. Pt O2 was 95% on room air, no labored breathing, can speak full sentences.  ?

## 2022-01-15 NOTE — ED Notes (Signed)
Pt denies SOB at this time but states that he feels congested and that his throat feels dry. Pt says that his congestion gets worse at night but he has sinus drainage during the day. Pt denies chest pain.  ?

## 2022-01-15 NOTE — Discharge Instructions (Addendum)
It was our pleasure to provide your ER care today - we hope that you feel better. ? ?Your covid/flu test is negative, and your xray shows no pneumonia. ? ?Drink plenty of fluids/stay well hydrated. Use humidifier in room you sleep in. May use nasal saline drops/spray as need for congestion. May try zyrtec-d as need for congestion.  ? ?Follow up with primary care doctor in the coming week for recheck if symptoms fail to improve/resolve. ? ?Your blood pressure is high today - continue your meds, limit salt intake, and follow up with primary care doctor in one week.  ? ?Return to ER if worse, new symptoms, high fevers, trouble breathing, unable to swallow, chest pain, or other concern.  ?

## 2022-01-15 NOTE — ED Provider Notes (Signed)
?[facility] ?Provider Note ? ? ?CSN: [id number] ?Arrival date & time: 01/15/22  2120 ? ?  ? ?History ? ?Chief Complaint  ?Patient presents with  ? Nasal Congestion  ?  and runny nose  ? ? ?[name] is a 88 y.o. male. ? ?Patient c/o nasal congestion/rhinorrhea in the past several days. States throat felt try earlier, but now throat is feeling better. Denies bad cough or any productive cough. Is able to swallow, no shortness of breath. Denies chest pain or chest discomfort. No specific known ill contacts. Denies hx environmental/pollen allergies.  ? ?The history is provided by the patient, medical records and a relative.  ?Shortness of Breath ?Associated symptoms: no chest pain, no cough, no fever, no headaches, no neck pain, no rash and no vomiting   ? ?  ? ?Home Medications ?Prior to Admission medications   ?Medication Sig Start Date End Date Taking? Authorizing Provider  ?acetaminophen (TYLENOL) 500 MG tablet Take 1,000 mg by mouth every 6 (six) hours as needed for moderate pain.    [provider]  ?Cholecalciferol (VITAMIN D3) 5000 units CAPS Take 5,000 Units by mouth once a week.     [provider]  ?diltiazem (CARDIZEM CD) 120 MG 24 hr capsule Take one capsule daily 08/13/21   [name], Fransisca Kaufmann, MD  ?ezetimibe (ZETIA) 10 MG tablet Take 1 tablet (10 mg total) by mouth daily. 12/18/21   [name], Fransisca Kaufmann, MD  ?finasteride (PROSCAR) 5 MG tablet Take one tablet each day 08/13/21   [name], Fransisca Kaufmann, MD  ?fluticasone (FLONASE) 50 MCG/ACT nasal spray Place 1 spray into both nostrils daily as needed for allergies or rhinitis.    [provider]  ?furosemide (LASIX) 20 MG tablet Take 1 tablet (20 mg total) by mouth daily. 12/18/21   [name], Fransisca Kaufmann, MD  ?lovastatin (MEVACOR) 20 MG tablet TAKE 1/2 TABLET EVERY OTHER DAY 08/13/21   [name], Fransisca Kaufmann, MD  ?metoprolol tartrate (LOPRESSOR) 100 MG tablet Take 1 tablet (100 mg total) by mouth 2 (two) times daily. 09/17/21    Minus Breeding, MD  ?Polyethyl Glycol-Propyl Glycol (SYSTANE OP) Place 1 drop into both eyes 3 (three) times daily as needed (dry eyes).     [provider]  ?warfarin (COUMADIN) 5 MG tablet TAKE 1 TABLET ON SAT, SUN, TUES, & THURSTAKE 1/2 TABLET ON MON, WED, & FRIDAY 12/18/21   [name], Fransisca Kaufmann, MD  ?   ? ?Allergies    ?Penicillins, Hct [hydrochlorothiazide], and Statins   ? ?Review of Systems   ?Review of Systems  ?Constitutional:  Negative for chills and fever.  ?HENT:  Negative for sinus pain and trouble swallowing.   ?Respiratory:  Negative for cough and shortness of breath.   ?Cardiovascular:  Negative for chest pain.  ?Gastrointestinal:  Negative for vomiting.  ?Genitourinary:  Negative for dysuria.  ?Musculoskeletal:  Negative for neck pain and neck stiffness.  ?Skin:  Negative for rash.  ?Neurological:  Negative for headaches.  ? ?Physical Exam ?Updated Vital Signs ?BP (!) 169/100 (BP Location: Right Arm)   Pulse 84   Temp 98 ?F (36.7 ?C) (Oral)   Resp 20   Ht 1.676 m (5\' 6" )   Wt 85.5 kg   SpO2 93%   BMI 30.42 kg/m?  ?Physical Exam ?Vitals and nursing note reviewed.  ?Constitutional:   ?   Appearance: Normal appearance. He is well-developed.  ?HENT:  ?   Head: Atraumatic.  ?   Nose: Congestion  and rhinorrhea present.  ?   Mouth/Throat:  ?   Mouth: Mucous membranes are moist.  ?   Pharynx: Oropharynx is clear. No oropharyngeal exudate or posterior oropharyngeal erythema.  ?Eyes:  ?   General: No scleral icterus. ?   Conjunctiva/sclera: Conjunctivae normal.  ?   Pupils: Pupils are equal, round, and reactive to light.  ?Neck:  ?   Trachea: No tracheal deviation.  ?   Comments: Trachea midline. No neck mass or swelling noted.  ?Cardiovascular:  ?   Rate and Rhythm: Normal rate and regular rhythm.  ?   Pulses: Normal pulses.  ?   Heart sounds: Normal heart sounds. No murmur heard. ?  No friction rub. No gallop.  ?Pulmonary:  ?   Effort: Pulmonary effort is normal. No accessory muscle usage  or respiratory distress.  ?   Breath sounds: Normal breath sounds. No stridor.  ?Abdominal:  ?   General: There is no distension.  ?   Tenderness: There is no abdominal tenderness.  ?Genitourinary: ?   Comments: No cva tenderness. ?Musculoskeletal:     ?   General: No swelling or tenderness.  ?   Cervical back: Normal range of motion and neck supple. No rigidity.  ?Skin: ?   General: Skin is warm and dry.  ?   Findings: No rash.  ?Neurological:  ?   Mental Status: He is alert.  ?   Comments: Alert, speech clear.   ?Psychiatric:     ?   Mood and Affect: Mood normal.  ? ? ?ED Results / Procedures / Treatments   ?Labs ?(all labs ordered are listed, but only abnormal results are displayed) ?Labs Reviewed  ?RESP PANEL BY RT-PCR (FLU A&B, COVID) [id number]  ? ? ?EKG ?EKG Interpretation ? ?Date/Time:  Thursday January 15 2022 21:43:35 EDT ?Ventricular Rate:  80 ?PR Interval:    ?QRS Duration: 90 ?QT Interval:  382 ?QTC Calculation: 440 ?R Axis:   -7 ?Text Interpretation: Atrial fibrillation Nonspecific ST and T wave abnormality Confirmed by Lajean Saver [id number]) on 01/15/2022 10:20:07 PM ? ?Radiology ?DG Chest Portable 1 View ? ?Result Date: 01/15/2022 ?CLINICAL DATA:  Shortness of breath. EXAM: PORTABLE CHEST 1 VIEW COMPARISON:  Chest x-ray 05/13/2018. FINDINGS: Sternotomy wires are again seen. The heart is mildly enlarged. There is no focal lung infiltrate. There is minimal bibasilar atelectasis. There is no pleural effusion or pneumothorax. No acute fractures are seen. IMPRESSION: 1. No acute cardiopulmonary process. 2. Stable cardiomegaly. Electronically Signed   By: Ronney Asters M.D.   On: 01/15/2022 22:16   ? ?Procedures ?Procedures  ? ? ?Medications Ordered in ED ?Medications  ?loratadine (CLARITIN) tablet 10 mg (has no administration in time range)  ? ? ?ED Course/ Medical Decision Making/ A&P ?  ?                        ?Medical Decision Making ?Problems Addressed: ?Essential hypertension: chronic illness or injury with  exacerbation, progression, or side effects of treatment that poses a threat to life or bodily functions ?Nasal congestion: acute illness or injury ?Rhinorrhea: acute illness or injury ? ?Amount and/or Complexity of Data Reviewed ?Independent Historian:  ?   Details: family, hx ?External Data Reviewed: notes. ?Labs: ordered. ?Radiology: ordered and independent interpretation performed. Decision-making details documented in ED Course. ? ?Risk ?OTC drugs. ?Prescription drug management. ? ? ?Iv ns. Continuous pulse ox and cardiac monitoring. Labs ordered/sent. Imaging ordered.  ? ?Reviewed nursing notes  and prior charts for additional history. External reports reviewed. Additional history from: family member.  ? ?Cardiac monitor: sinus rhythm, rate 78. ? ?Labs reviewed/interpreted by me - covid neg.  ? ?Xrays reviewed/interpreted by me - no pna.  ? ?Claritin po.  ? ?Pt is breathing comfortably, o2 sats currently 97%.  Po fluids.  ? ?Pt currently appears stable for d/c.  Rec pcp f/u.  ? ?Return precautions provided.  ? ? ? ? ? ? ? ? ?Final Clinical Impression(s) / ED Diagnoses ?Final diagnoses:  ?None  ? ? ?Rx / DC Orders ?ED Discharge Orders   ? ? None  ? ?  ? ? ?  ?Lajean Saver, MD ?01/20/22 1255 ? ?

## 2022-02-16 ENCOUNTER — Ambulatory Visit (INDEPENDENT_AMBULATORY_CARE_PROVIDER_SITE_OTHER): Payer: Medicare Other | Admitting: Family Medicine

## 2022-02-16 ENCOUNTER — Encounter: Payer: Self-pay | Admitting: Family Medicine

## 2022-02-16 VITALS — BP 143/77 | HR 70 | Ht 66.0 in | Wt 185.0 lb

## 2022-02-16 DIAGNOSIS — I4819 Other persistent atrial fibrillation: Secondary | ICD-10-CM | POA: Diagnosis not present

## 2022-02-16 DIAGNOSIS — N400 Enlarged prostate without lower urinary tract symptoms: Secondary | ICD-10-CM

## 2022-02-16 DIAGNOSIS — Z7901 Long term (current) use of anticoagulants: Secondary | ICD-10-CM | POA: Diagnosis not present

## 2022-02-16 DIAGNOSIS — E785 Hyperlipidemia, unspecified: Secondary | ICD-10-CM

## 2022-02-16 DIAGNOSIS — R972 Elevated prostate specific antigen [PSA]: Secondary | ICD-10-CM

## 2022-02-16 DIAGNOSIS — I10 Essential (primary) hypertension: Secondary | ICD-10-CM

## 2022-02-16 DIAGNOSIS — R7303 Prediabetes: Secondary | ICD-10-CM | POA: Diagnosis not present

## 2022-02-16 LAB — BAYER DCA HB A1C WAIVED: HB A1C (BAYER DCA - WAIVED): 5.8 % — ABNORMAL HIGH (ref 4.8–5.6)

## 2022-02-16 LAB — COAGUCHEK XS/INR WAIVED
INR: 1.5 — ABNORMAL HIGH (ref 0.9–1.1)
Prothrombin Time: 18.1 s

## 2022-02-16 NOTE — Progress Notes (Signed)
? ?BP (!) 143/77   Pulse 70   Ht 5' 6"  (1.676 m)   Wt 185 lb (83.9 kg)   SpO2 95%   BMI 29.86 kg/m?   ? ?Subjective:  ? ?Patient ID: [name], male    DOB: May 13, 1932, 88 y.o.   MRN: [id number] ? ?HPI: ?[name] is a 88 y.o. male presenting on 02/16/2022 for Medical Management of Chronic Issues, Atrial Fibrillation, Hypertension, Prediabetes, and Benign Prostatic Hypertrophy (Elevated PSA) ? ? ?HPI ?Hypertension ?Patient is currently on diltiazem and furosemide and metoprolol, and their blood pressure today is 143/77, heart rate 70. Patient denies any lightheadedness or dizziness. Patient denies headaches, blurred vision, chest pains, shortness of breath, or weakness. Denies any side effects from medication and is content with current medication.  ? ?Hyperlipidemia ?Patient is coming in for recheck of his hyperlipidemia. The patient is currently taking Zetia and lovastatin. They deny any issues with myalgias or history of liver damage from it. They deny any focal numbness or weakness or chest pain.  ? ?Prediabetes  ?patient comes in today for recheck of his diabetes. Patient has been currently taking no medicine, A1c is 5.8, diet controlled. Patient is not currently on an ACE inhibitor/ARB. Patient has not seen an ophthalmologist this year. Patient denies any issues with their feet. The symptom started onset as an adult hypertension and hyperlipidemia and A-fib ARE RELATED TO DM  ? ?Coumadin recheck ?Target goal: 2.0-3.0 ?Reason on anticoagulation: A-fib ?Patient denies any bruising or bleeding or chest pain or palpitations  ? ?[name] ?Patient is coming in for recheck on [name] ?Symptoms: None currently ?Medication: Finasteride ?Last PSA: 6 months ago was 10.8 ? ?Relevant past medical, surgical, family and social history reviewed and updated as indicated. Interim medical history since our last visit reviewed. ?Allergies and medications reviewed and updated. ? ?Review of Systems  ?Constitutional:  Negative for  chills and fever.  ?Eyes:  Negative for visual disturbance.  ?Respiratory:  Negative for shortness of breath and wheezing.   ?Cardiovascular:  Negative for chest pain and leg swelling.  ?Gastrointestinal:  Negative for blood in stool.  ?Genitourinary:  Negative for hematuria.  ?Musculoskeletal:  Negative for gait problem.  ?Skin:  Negative for rash.  ?Neurological:  Negative for dizziness, weakness and light-headedness.  ?All other systems reviewed and are negative. ? ?Per HPI unless specifically indicated above ? ? ?Allergies as of 02/16/2022   ? ?   Reactions  ? Penicillins Hives, Rash  ? Has patient had a PCN reaction causing immediate rash, facial/tongue/throat swelling, SOB or lightheadedness with hypotension: Yes ?Has patient had a PCN reaction causing severe rash involving mucus membranes or skin necrosis: Yes ?Has patient had a PCN reaction that required hospitalization: No ?Has patient had a PCN reaction occurring within the last 10 years: No ?If all of the above answers are "NO", then may proceed with Cephalosporin use.  ? Hct [hydrochlorothiazide] Other (See Comments)  ? hyper  ? Statins Other (See Comments)  ? Myalgia, tolerates low dosages of lovastatin   ? ?  ? ?  ?Medication List  ?  ? ?  ? Accurate as of Feb 16, 2022 10:38 AM. If you have any questions, ask your nurse or doctor.  ?  ?  ? ?  ? ?acetaminophen 500 MG tablet ?Commonly known as: TYLENOL ?Take 1,000 mg by mouth every 6 (six) hours as needed for moderate pain. ?  ?cetirizine-pseudoephedrine 5-120 MG tablet ?Commonly known as: ZYRTEC-D ?Take once  per day as need for congestion/runny nose ?  ?diltiazem 120 MG 24 hr capsule ?Commonly known as: CARDIZEM CD ?Take one capsule daily ?  ?ezetimibe 10 MG tablet ?Commonly known as: ZETIA ?Take 1 tablet (10 mg total) by mouth daily. ?  ?finasteride 5 MG tablet ?Commonly known as: PROSCAR ?Take one tablet each day ?  ?fluticasone 50 MCG/ACT nasal spray ?Commonly known as: FLONASE ?Place 1 spray into both  nostrils daily as needed for allergies or rhinitis. ?  ?furosemide 20 MG tablet ?Commonly known as: LASIX ?Take 1 tablet (20 mg total) by mouth daily. ?  ?lovastatin 20 MG tablet ?Commonly known as: MEVACOR ?TAKE 1/2 TABLET EVERY OTHER DAY ?  ?metoprolol tartrate 100 MG tablet ?Commonly known as: LOPRESSOR ?Take 1 tablet (100 mg total) by mouth 2 (two) times daily. ?  ?SYSTANE OP ?Place 1 drop into both eyes 3 (three) times daily as needed (dry eyes). ?  ?Vitamin D3 125 MCG (5000 UT) Caps ?Take 5,000 Units by mouth once a week. ?  ?warfarin 5 MG tablet ?Commonly known as: COUMADIN ?Take as directed by the anticoagulation clinic. If you are unsure how to take this medication, talk to your nurse or doctor. ?Original instructions: TAKE 1 TABLET ON SAT, SUN, TUES, & THURSTAKE 1/2 TABLET ON MON, WED, & FRIDAY ?  ? ?  ? ? ? ?Objective:  ? ?BP (!) 143/77   Pulse 70   Ht $R'5\' 6"'Fx$  (1.676 m)   Wt 185 lb (83.9 kg)   SpO2 95%   BMI 29.86 kg/m?   ?Wt Readings from Last 3 Encounters:  ?02/16/22 185 lb (83.9 kg)  ?01/15/22 188 lb 7.9 oz (85.5 kg)  ?12/18/21 188 lb 6.4 oz (85.5 kg)  ?  ?Physical Exam ?Vitals and nursing note reviewed.  ?Constitutional:   ?   General: He is not in acute distress. ?   Appearance: He is well-developed. He is not diaphoretic.  ?Eyes:  ?   General: No scleral icterus. ?   Conjunctiva/sclera: Conjunctivae normal.  ?Neck:  ?   Thyroid: No thyromegaly.  ?Cardiovascular:  ?   Rate and Rhythm: Normal rate and regular rhythm.  ?   Heart sounds: Normal heart sounds. No murmur heard. ?Pulmonary:  ?   Effort: Pulmonary effort is normal. No respiratory distress.  ?   Breath sounds: Normal breath sounds. No wheezing or rhonchi.  ?Musculoskeletal:  ?   Cervical back: Neck supple.  ?Lymphadenopathy:  ?   Cervical: No cervical adenopathy.  ?Skin: ?   General: Skin is warm and dry.  ?   Findings: No rash.  ?Neurological:  ?   Mental Status: He is alert and oriented to person, place, and time.  ?   Coordination:  Coordination normal.  ?Psychiatric:     ?   Behavior: Behavior normal.  ? ? ?Description   ?Take 2 5 mg tablets a day for total of 10 mg and then continue weekly dose by taking 1 tablet every day except for Mondays and Thursdays take 1/2 tablet ? ?INR today is  1.5 (goal is 2-3)   ? ?Recheck in 2-4 weeks ?  ? ? ? ?Assessment & Plan:  ? ?Problem List Items Addressed This Visit   ? ?  ? Cardiovascular and Mediastinum  ? Essential hypertension  ? Relevant Orders  ? CBC with Differential/Platelet  ? CMP14+EGFR  ? Lipid panel  ? Bayer DCA Hb A1c Waived  ? CoaguChek XS/INR Waived  ? Atrial fibrillation, persistent (  [place]) - Primary  ? Relevant Orders  ? CBC with Differential/Platelet  ? CMP14+EGFR  ? Lipid panel  ? Bayer DCA Hb A1c Waived  ? CoaguChek XS/INR Waived  ?  ? Genitourinary  ? [name] with elevated PSA  ? Relevant Orders  ? PSA, total and free  ?  ? Other  ? Dyslipidemia  ? Relevant Orders  ? CBC with Differential/Platelet  ? CMP14+EGFR  ? Lipid panel  ? Bayer DCA Hb A1c Waived  ? CoaguChek XS/INR Waived  ? Prediabetes  ? Relevant Orders  ? CBC with Differential/Platelet  ? CMP14+EGFR  ? Lipid panel  ? Bayer DCA Hb A1c Waived  ? CoaguChek XS/INR Waived  ? Long term current use of anticoagulant therapy  ?  ?Adjusted Coumadin dosing and printed out calendar. ? ?A1c looks great. ? ?Allowing permissive hypertension because of age so no changes.Marland Kitchen ?Follow up plan: ?Return if symptoms worsen or fail to improve, for 2 to 4-week INR recheck. ? ?Counseling provided for all of the vaccine components ?Orders Placed This Encounter  ?Procedures  ? CBC with Differential/Platelet  ? CMP14+EGFR  ? Lipid panel  ? Bayer DCA Hb A1c Waived  ? CoaguChek XS/INR Waived  ? PSA, total and free  ? ? ?Caryl Pina, MD ?[place] ?02/16/2022, 10:38 AM ? ? ? ? ?

## 2022-02-17 LAB — PSA, TOTAL AND FREE
PSA, Free Pct: 11.2 %
PSA, Free: 1.27 ng/mL
Prostate Specific Ag, Serum: 11.3 ng/mL — ABNORMAL HIGH (ref 0.0–4.0)

## 2022-02-17 LAB — CBC WITH DIFFERENTIAL/PLATELET
Basophils Absolute: 0.1 10*3/uL (ref 0.0–0.2)
Basos: 1 %
EOS (ABSOLUTE): 1 10*3/uL — ABNORMAL HIGH (ref 0.0–0.4)
Eos: 8 %
Hematocrit: 41.4 % (ref 37.5–51.0)
Hemoglobin: 14 g/dL (ref 13.0–17.7)
Immature Grans (Abs): 0 10*3/uL (ref 0.0–0.1)
Immature Granulocytes: 0 %
Lymphocytes Absolute: 5.3 10*3/uL — ABNORMAL HIGH (ref 0.7–3.1)
Lymphs: 44 %
MCH: 33.6 pg — ABNORMAL HIGH (ref 26.6–33.0)
MCHC: 33.8 g/dL (ref 31.5–35.7)
MCV: 99 fL — ABNORMAL HIGH (ref 79–97)
Monocytes Absolute: 0.8 10*3/uL (ref 0.1–0.9)
Monocytes: 6 %
Neutrophils Absolute: 5 10*3/uL (ref 1.4–7.0)
Neutrophils: 41 %
Platelets: 182 10*3/uL (ref 150–450)
RBC: 4.17 x10E6/uL (ref 4.14–5.80)
RDW: 11.8 % (ref 11.6–15.4)
WBC: 12.2 10*3/uL — ABNORMAL HIGH (ref 3.4–10.8)

## 2022-02-17 LAB — CMP14+EGFR
ALT: 17 IU/L (ref 0–44)
AST: 22 IU/L (ref 0–40)
Albumin/Globulin Ratio: 1.7 (ref 1.2–2.2)
Albumin: 4.1 g/dL (ref 3.6–4.6)
Alkaline Phosphatase: 60 IU/L (ref 44–121)
BUN/Creatinine Ratio: 17 (ref 10–24)
BUN: 19 mg/dL (ref 8–27)
Bilirubin Total: 0.6 mg/dL (ref 0.0–1.2)
CO2: 26 mmol/L (ref 20–29)
Calcium: 9.4 mg/dL (ref 8.6–10.2)
Chloride: 106 mmol/L (ref 96–106)
Creatinine, Ser: 1.12 mg/dL (ref 0.76–1.27)
Globulin, Total: 2.4 g/dL (ref 1.5–4.5)
Glucose: 100 mg/dL — ABNORMAL HIGH (ref 70–99)
Potassium: 4.4 mmol/L (ref 3.5–5.2)
Sodium: 144 mmol/L (ref 134–144)
Total Protein: 6.5 g/dL (ref 6.0–8.5)
eGFR: 64 mL/min/{1.73_m2} (ref >59)

## 2022-02-17 LAB — LIPID PANEL
Chol/HDL Ratio: 3.3 ratio (ref 0.0–5.0)
Cholesterol, Total: 154 mg/dL (ref 100–199)
HDL: 46 mg/dL (ref >39)
LDL Chol Calc (NIH): 90 mg/dL (ref 0–99)
Triglycerides: 96 mg/dL (ref 0–149)
VLDL Cholesterol Cal: 18 mg/dL (ref 5–40)

## 2022-02-18 DIAGNOSIS — H353131 Nonexudative age-related macular degeneration, bilateral, early dry stage: Secondary | ICD-10-CM | POA: Diagnosis not present

## 2022-02-27 DIAGNOSIS — H0015 Chalazion left lower eyelid: Secondary | ICD-10-CM | POA: Diagnosis not present

## 2022-02-27 DIAGNOSIS — D485 Neoplasm of uncertain behavior of skin: Secondary | ICD-10-CM | POA: Diagnosis not present

## 2022-02-27 DIAGNOSIS — D4989 Neoplasm of unspecified behavior of other specified sites: Secondary | ICD-10-CM | POA: Diagnosis not present

## 2022-02-27 DIAGNOSIS — H10401 Unspecified chronic conjunctivitis, right eye: Secondary | ICD-10-CM | POA: Diagnosis not present

## 2022-02-27 DIAGNOSIS — H01001 Unspecified blepharitis right upper eyelid: Secondary | ICD-10-CM | POA: Diagnosis not present

## 2022-03-20 ENCOUNTER — Encounter: Payer: Self-pay | Admitting: Family Medicine

## 2022-03-20 ENCOUNTER — Ambulatory Visit (INDEPENDENT_AMBULATORY_CARE_PROVIDER_SITE_OTHER): Payer: Medicare Other | Admitting: Family Medicine

## 2022-03-20 VITALS — BP 134/76 | HR 73 | Temp 98.2°F | Ht 66.0 in | Wt 188.0 lb

## 2022-03-20 DIAGNOSIS — Z7901 Long term (current) use of anticoagulants: Secondary | ICD-10-CM | POA: Diagnosis not present

## 2022-03-20 DIAGNOSIS — I4819 Other persistent atrial fibrillation: Secondary | ICD-10-CM | POA: Diagnosis not present

## 2022-03-20 LAB — COAGUCHEK XS/INR WAIVED
INR: 1.9 — ABNORMAL HIGH (ref 0.9–1.1)
Prothrombin Time: 22.9 s

## 2022-03-20 NOTE — Progress Notes (Signed)
BP 134/76   Pulse 73   Temp 98.2 F (36.8 C)   Ht 5\' 6"  (1.676 m)   Wt 188 lb (85.3 kg)   SpO2 97%   BMI 30.34 kg/m    Subjective:   Patient ID: [name], male    DOB: [birth year], 88 y.o.   MRN: [id number]  HPI: [name] is a 88 y.o. male presenting on 03/20/2022 for Medical Management of Chronic Issues and Atrial Fibrillation   HPI Coumadin recheck Target goal: 2.0-3.0 Reason on anticoagulation: afib Patient denies any bruising or bleeding or chest pain or palpitations   Relevant past medical, surgical, family and social history reviewed and updated as indicated. Interim medical history since our last visit reviewed. Allergies and medications reviewed and updated.  Review of Systems  Constitutional:  Negative for chills and fever.  Eyes:  Negative for visual disturbance.  Respiratory:  Negative for shortness of breath and wheezing.   Cardiovascular:  Negative for chest pain and leg swelling.  Gastrointestinal:  Negative for blood in stool.  Genitourinary:  Negative for hematuria.  Musculoskeletal:  Negative for back pain and gait problem.  Skin:  Negative for rash.  Neurological:  Negative for dizziness, weakness and light-headedness.  All other systems reviewed and are negative.   Per HPI unless specifically indicated above   Allergies as of 03/20/2022       Reactions   Penicillins Hives, Rash   Has patient had a PCN reaction causing immediate rash, facial/tongue/throat swelling, SOB or lightheadedness with hypotension: Yes Has patient had a PCN reaction causing severe rash involving mucus membranes or skin necrosis: Yes Has patient had a PCN reaction that required hospitalization: No Has patient had a PCN reaction occurring within the last 10 years: No If all of the above answers are "NO", then may proceed with Cephalosporin use.   Hct [hydrochlorothiazide] Other (See Comments)   hyper   Statins Other (See Comments)   Myalgia, tolerates low dosages of  lovastatin         Medication List        Accurate as of March 20, 2022  2:26 PM. If you have any questions, ask your nurse or doctor.          acetaminophen 500 MG tablet Commonly known as: TYLENOL Take 1,000 mg by mouth every 6 (six) hours as needed for moderate pain.   cetirizine-pseudoephedrine 5-120 MG tablet Commonly known as: ZYRTEC-D Take once per day as need for congestion/runny nose   diltiazem 120 MG 24 hr capsule Commonly known as: CARDIZEM CD Take one capsule daily   ezetimibe 10 MG tablet Commonly known as: ZETIA Take 1 tablet (10 mg total) by mouth daily.   finasteride 5 MG tablet Commonly known as: PROSCAR Take one tablet each day   fluticasone 50 MCG/ACT nasal spray Commonly known as: FLONASE Place 1 spray into both nostrils daily as needed for allergies or rhinitis.   furosemide 20 MG tablet Commonly known as: LASIX Take 1 tablet (20 mg total) by mouth daily.   lovastatin 20 MG tablet Commonly known as: MEVACOR TAKE 1/2 TABLET EVERY OTHER DAY   metoprolol tartrate 100 MG tablet Commonly known as: LOPRESSOR Take 1 tablet (100 mg total) by mouth 2 (two) times daily.   SYSTANE OP Place 1 drop into both eyes 3 (three) times daily as needed (dry eyes).   Vitamin D3 125 MCG (5000 UT) Caps Take 5,000 Units by mouth once a week.  warfarin 5 MG tablet Commonly known as: COUMADIN Take as directed by the anticoagulation clinic. If you are unsure how to take this medication, talk to your nurse or doctor. Original instructions: TAKE 1 TABLET ON SAT, SUN, TUES, & THURSTAKE 1/2 TABLET ON MON, WED, & FRIDAY         Objective:   BP 134/76   Pulse 73   Temp 98.2 F (36.8 C)   Ht 5\' 6"  (1.676 m)   Wt 188 lb (85.3 kg)   SpO2 97%   BMI 30.34 kg/m   Wt Readings from Last 3 Encounters:  03/20/22 188 lb (85.3 kg)  02/16/22 185 lb (83.9 kg)  01/15/22 188 lb 7.9 oz (85.5 kg)    Physical Exam Vitals and nursing note reviewed.  Constitutional:       General: He is not in acute distress.    Appearance: He is well-developed. He is not diaphoretic.  Eyes:     General: No scleral icterus.    Conjunctiva/sclera: Conjunctivae normal.  Neck:     Thyroid: No thyromegaly.  Cardiovascular:     Rate and Rhythm: Normal rate. Rhythm irregular.     Heart sounds: Normal heart sounds. No murmur heard. Pulmonary:     Effort: Pulmonary effort is normal. No respiratory distress.     Breath sounds: Normal breath sounds. No wheezing.  Musculoskeletal:     Cervical back: Neck supple.  Lymphadenopathy:     Cervical: No cervical adenopathy.  Skin:    General: Skin is warm and dry.     Findings: No rash.  Neurological:     Mental Status: He is alert and oriented to person, place, and time.     Coordination: Coordination normal.  Psychiatric:        Behavior: Behavior normal.     Description   Take 1/2 tablet extra and then continue weekly dose by taking 1 tablet every day except for Mondays and Thursdays take 1/2 tablet  INR today is  1.9 (goal is 2-3)    Recheck in 4-6 weeks      Assessment & Plan:   Problem List Items Addressed This Visit       Cardiovascular and Mediastinum   Atrial fibrillation, persistent ([place]) - Primary   Relevant Orders   CoaguChek XS/INR Waived     Other   Long term current use of anticoagulant therapy     Follow up plan: Return if symptoms worsen or fail to improve, for 4-6 week inr recheck.  Counseling provided for all of the vaccine components Orders Placed This Encounter  Procedures   CoaguChek XS/INR [place] Emmaclaire Switala, MD [facility] 03/20/2022, 2:26 PM

## 2022-03-30 ENCOUNTER — Ambulatory Visit (INDEPENDENT_AMBULATORY_CARE_PROVIDER_SITE_OTHER): Payer: Medicare Other | Admitting: Family Medicine

## 2022-03-30 ENCOUNTER — Encounter: Payer: Self-pay | Admitting: Family Medicine

## 2022-03-30 VITALS — BP 140/86 | HR 84 | Temp 97.0°F | Ht 66.0 in | Wt 186.0 lb

## 2022-03-30 DIAGNOSIS — J302 Other seasonal allergic rhinitis: Secondary | ICD-10-CM

## 2022-03-30 MED ORDER — MONTELUKAST SODIUM 10 MG PO TABS: 10.0000 mg | ORAL_TABLET | Freq: Every day | ORAL | 3 refills | Status: DC

## 2022-03-30 MED ORDER — AZELASTINE HCL 0.1 % NA SOLN: 1.0000 | Freq: Two times a day (BID) | NASAL | 12 refills | Status: DC

## 2022-03-30 NOTE — Progress Notes (Addendum)
   Acute Office Visit  Subjective:     Patient ID: [name], male    DOB: [birth year], 88 y.o.   MRN: [id number]  Chief Complaint  Patient presents with   Medical Management of Chronic Issues   Nasal Congestion    HPI [name] is a 88 y.o. male presenting to the clinic on 03/30/2022 for chronic nasal congestion. He reports the nasal congestion started about 1.5 years ago and has progressed such that now he is waking up during the night short of breath from the congestion. He also states his throat and mouth become dry during the night due to mouth breathing. He visited the ER about 3 weeks ago for these symptoms, and he was started on cetirizine 10 mg and oxymetazoline once daily. His symptoms improve with oxymetazoline but do not improve with cetirizine, phenylephrine, or benadryl.He denies fever, cough, and sore throat.  Review of Systems  Constitutional:  Negative for chills and fever.  HENT:  Positive for congestion and sinus pain. Negative for ear discharge, ear pain, hearing loss, nosebleeds and sore throat.   Eyes:  Negative for blurred vision and pain.  Respiratory:  Negative for cough and hemoptysis.         Objective:    BP 140/86   Pulse 84   Temp (!) 97 F (36.1 C)   Ht 5\' 6"  (1.676 m)   Wt 186 lb (84.4 kg)   SpO2 96%   BMI 30.02 kg/m    Physical Exam Constitutional:      Appearance: Normal appearance.  HENT:     Right Ear: Tympanic membrane, ear canal and external ear normal.     Left Ear: Tympanic membrane, ear canal and external ear normal.     Nose: Congestion present.     Mouth/Throat:     Mouth: Mucous membranes are moist.     Pharynx: Oropharynx is clear.  Cardiovascular:     Rate and Rhythm: Normal rate. Rhythm irregular.     Heart sounds: Normal heart sounds.  Pulmonary:     Effort: Pulmonary effort is normal.     Breath sounds: Normal breath sounds.  Neurological:     Mental Status: He is alert.      No results found for any visits  on 03/30/22.      Assessment & Plan:   Problem List Items Addressed This Visit   None Visit Diagnoses     Seasonal allergies    -  Primary   Relevant Medications   montelukast (SINGULAIR) 10 MG tablet       Meds ordered this encounter  Medications   montelukast (SINGULAIR) 10 MG tablet    Sig: Take 1 tablet (10 mg total) by mouth at bedtime.    Dispense:  30 tablet    Refill:  3   azelastine (ASTELIN) 0.1 % nasal spray    Sig: Place 1 spray into both nostrils 2 (two) times daily. Use in each nostril as directed    Dispense:  30 mL    Refill:  12    Return if symptoms worsen or fail to improve.  Michell Heinrich, Medical Student  Patient seen and examined with Arlan Organ medical student, agree with assessment and plan above. Caryl Pina, MD [facility] 04/01/2022, 4:52 PM

## 2022-03-30 NOTE — Progress Notes (Signed)
BP 140/86   Pulse 84   Temp (!) 97 F (36.1 C)   Ht 5\' 6"  (1.676 m)   Wt 186 lb (84.4 kg)   SpO2 96%   BMI 30.02 kg/m    Subjective:   Patient ID: [name], male    DOB: 03-May-1932, 88 y.o.   MRN: [id number]  HPI: [name] is a 88 y.o. male presenting on 03/30/2022 for Medical Management of Chronic Issues and Nasal Congestion   HPI Patient is having worsening nasal congestion over the past month and half and is half phlegm and difficulty breathing through nose.  He is using afrin and it helps.  He is using zyrtec and flonase and benadryl which did not help.  He is a mouth breather at night.  He denies SOB or wheezing.   Relevant past medical, surgical, family and social history reviewed and updated as indicated. Interim medical history since our last visit reviewed. Allergies and medications reviewed and updated.  Review of Systems  Constitutional:  Negative for chills and fever.  HENT:  Positive for congestion and postnasal drip. Negative for ear discharge, ear pain, rhinorrhea, sinus pressure, sneezing, sore throat and voice change.   Eyes:  Negative for pain, discharge, redness and visual disturbance.  Respiratory:  Negative for cough, shortness of breath and wheezing.   Cardiovascular:  Negative for chest pain and leg swelling.  Musculoskeletal:  Negative for gait problem.  Skin:  Negative for rash.  All other systems reviewed and are negative.   Per HPI unless specifically indicated above   Allergies as of 03/30/2022       Reactions   Penicillins Hives, Rash   Has patient had a PCN reaction causing immediate rash, facial/tongue/throat swelling, SOB or lightheadedness with hypotension: Yes Has patient had a PCN reaction causing severe rash involving mucus membranes or skin necrosis: Yes Has patient had a PCN reaction that required hospitalization: No Has patient had a PCN reaction occurring within the last 10 years: No If all of the above answers are "NO",  then may proceed with Cephalosporin use.   Hct [hydrochlorothiazide] Other (See Comments)   hyper   Statins Other (See Comments)   Myalgia, tolerates low dosages of lovastatin         Medication List        Accurate as of March 30, 2022  4:22 PM. If you have any questions, ask your nurse or doctor.          acetaminophen 500 MG tablet Commonly known as: TYLENOL Take 1,000 mg by mouth every 6 (six) hours as needed for moderate pain.   azelastine 0.1 % nasal spray Commonly known as: ASTELIN Place 1 spray into both nostrils 2 (two) times daily. Use in each nostril as directed Started by: Fransisca Kaufmann Chris Cripps, MD   cetirizine-pseudoephedrine 5-120 MG tablet Commonly known as: ZYRTEC-D Take once per day as need for congestion/runny nose   diltiazem 120 MG 24 hr capsule Commonly known as: CARDIZEM CD Take one capsule daily   ezetimibe 10 MG tablet Commonly known as: ZETIA Take 1 tablet (10 mg total) by mouth daily.   finasteride 5 MG tablet Commonly known as: PROSCAR Take one tablet each day   fluticasone 50 MCG/ACT nasal spray Commonly known as: FLONASE Place 1 spray into both nostrils daily as needed for allergies or rhinitis.   furosemide 20 MG tablet Commonly known as: LASIX Take 1 tablet (20 mg total) by mouth daily.  lovastatin 20 MG tablet Commonly known as: MEVACOR TAKE 1/2 TABLET EVERY OTHER DAY   metoprolol tartrate 100 MG tablet Commonly known as: LOPRESSOR Take 1 tablet (100 mg total) by mouth 2 (two) times daily.   montelukast 10 MG tablet Commonly known as: SINGULAIR Take 1 tablet (10 mg total) by mouth at bedtime. Started by: Fransisca Kaufmann Hymen Arnett, MD   SYSTANE OP Place 1 drop into both eyes 3 (three) times daily as needed (dry eyes).   Vitamin D3 125 MCG (5000 UT) Caps Take 5,000 Units by mouth once a week.   warfarin 5 MG tablet Commonly known as: COUMADIN Take as directed by the anticoagulation clinic. If you are unsure how to take this  medication, talk to your nurse or doctor. Original instructions: TAKE 1 TABLET ON SAT, SUN, TUES, & THURSTAKE 1/2 TABLET ON MON, WED, & FRIDAY         Objective:   BP 140/86   Pulse 84   Temp (!) 97 F (36.1 C)   Ht 5\' 6"  (1.676 m)   Wt 186 lb (84.4 kg)   SpO2 96%   BMI 30.02 kg/m   Wt Readings from Last 3 Encounters:  03/30/22 186 lb (84.4 kg)  03/20/22 188 lb (85.3 kg)  02/16/22 185 lb (83.9 kg)    Physical Exam Vitals and nursing note reviewed.  Constitutional:      General: He is not in acute distress.    Appearance: He is well-developed. He is not diaphoretic.  HENT:     Right Ear: External ear normal.     Left Ear: External ear normal.     Nose: Mucosal edema and rhinorrhea present.     Right Sinus: Maxillary sinus tenderness present. No frontal sinus tenderness.     Left Sinus: Maxillary sinus tenderness present. No frontal sinus tenderness.     Mouth/Throat:     Pharynx: Uvula midline. Posterior oropharyngeal erythema present. No oropharyngeal exudate.     Tonsils: No tonsillar abscesses.  Eyes:     General: No scleral icterus.    Conjunctiva/sclera: Conjunctivae normal.  Neck:     Thyroid: No thyromegaly.  Cardiovascular:     Rate and Rhythm: Normal rate and regular rhythm.     Heart sounds: Normal heart sounds. No murmur heard. Pulmonary:     Effort: Pulmonary effort is normal. No respiratory distress.     Breath sounds: Normal breath sounds. No wheezing or rales.  Musculoskeletal:        General: No swelling. Normal range of motion.     Cervical back: Neck supple.  Lymphadenopathy:     Cervical: No cervical adenopathy.  Skin:    General: Skin is warm and dry.     Findings: No rash.  Neurological:     Mental Status: He is alert and oriented to person, place, and time.     Coordination: Coordination normal.  Psychiatric:        Behavior: Behavior normal.       Assessment & Plan:   Problem List Items Addressed This Visit   None Visit  Diagnoses     Seasonal allergies    -  Primary   Relevant Medications   montelukast (SINGULAIR) 10 MG tablet        Follow up plan: Return if symptoms worsen or fail to improve.  Counseling provided for all of the vaccine components No orders of the defined types were placed in this encounter.   Caryl Pina, MD [facility] 03/30/2022, 4:22 PM

## 2022-04-01 ENCOUNTER — Ambulatory Visit: Payer: Medicare Other | Admitting: Family Medicine

## 2022-04-07 NOTE — Progress Notes (Signed)
Cardiology Office Note   Date:  04/08/2022   ID:  [name], DOB Oct 04, 1932, MRN [id number]  PCP:  [name], Fransisca Kaufmann, MD  Cardiologist:   Minus Breeding, MD   Chief Complaint  Patient presents with   Atrial Fibrillation       History of Present Illness: [name] is a 88 y.o. male who presents for followup of his coronary disease and atrial fibrillation.   Lexiscan Myoview in June 2018 demonstrated infarct but no ischemia.  EF on echo was 40 - 45%.  This was done to evaluate chest pain.  He returns for follow up.   Since I last saw him he has had no new acute problems.  His wife is going on her second year using a cane with a broken ankle and he has had take care of her.  He does have some tiredness.  He was trying to split his Cardizem to see if that help.  He really did not make a difference.  He is not having any presyncope or syncope.  He is not having any chest pressure, neck or arm discomfort.  He has no new shortness of breath.  He has some chronic dyspnea.   Past Medical History:  Diagnosis Date   Anemia    Atrial fibrillation (HCC)    Cataract    Cellulitis    In past   Complication of anesthesia    HARD TIME WAKING; CAUSES MY BP TO GO UP    Coronary artery disease    5 bypasses   DJD (degenerative joint disease)    Ejection fraction < 50%    Mildly reduced, 40% by echo   GERD (gastroesophageal reflux disease)    Hyperlipidemia    Hypertension    Persistent atrial fibrillation ([place])    Prostate hypertrophy    on CT scan 09/2014   PUD (peptic ulcer disease)    RESOLVED    Skin cancer of eyelid    Resected    Past Surgical History:  Procedure Laterality Date   APPENDECTOMY     BYPASS GRAFT  2005   CATARACT EXTRACTION W/PHACO Left 07/22/2015   Procedure: CATARACT EXTRACTION PHACO AND INTRAOCULAR LENS PLACEMENT LEFT EYE CDE=8.14;  Surgeon: Tonny Branch, MD;  Location: [facility];  Service: Ophthalmology;  Laterality: Left;   CATARACT EXTRACTION W/PHACO  Right 08/18/2019   Procedure: CATARACT EXTRACTION PHACO AND INTRAOCULAR LENS PLACEMENT (IOC);  Surgeon: Baruch Goldmann, MD;  Location: [facility];  Service: Ophthalmology;  Laterality: Right;  CDE: 9.74   CORONARY ARTERY BYPASS GRAFT  4/05   LIMA to LAD, SVG to PDA, SVG to ramus intermediate   EYE SURGERY  07/2015   EYELID CARCINOMA EXCISION     Skin cancer resected   Heart bypass  2005   TOTAL HIP ARTHROPLASTY     Right   TOTAL HIP ARTHROPLASTY Left 05/04/2018   Procedure: LEFT TOTAL HIP ARTHROPLASTY;  Surgeon: [name], MD;  Location: [facility];  Service: Orthopedics;  Laterality: Left;   TOTAL KNEE ARTHROPLASTY     Right     Current Outpatient Medications  Medication Sig Dispense Refill   acetaminophen (TYLENOL) 500 MG tablet Take 1,000 mg by mouth every 6 (six) hours as needed for moderate pain.     azelastine (ASTELIN) 0.1 % nasal spray Place 1 spray into both nostrils 2 (two) times daily. Use in each nostril as directed 30 mL 12   cetirizine-pseudoephedrine (ZYRTEC-D) 5-120 MG tablet Take once  per day as need for congestion/runny nose 10 tablet 0   Cholecalciferol (VITAMIN D3) 5000 units CAPS Take 5,000 Units by mouth once a week.      diltiazem (CARDIZEM CD) 120 MG 24 hr capsule Take one capsule daily 90 capsule 3   ezetimibe (ZETIA) 10 MG tablet Take 1 tablet (10 mg total) by mouth daily. 90 tablet 3   finasteride (PROSCAR) 5 MG tablet Take one tablet each day 90 tablet 3   fluticasone (FLONASE) 50 MCG/ACT nasal spray Place 1 spray into both nostrils daily as needed for allergies or rhinitis.     furosemide (LASIX) 20 MG tablet Take 1 tablet (20 mg total) by mouth daily. 90 tablet 3   lovastatin (MEVACOR) 20 MG tablet TAKE 1/2 TABLET EVERY OTHER DAY 23 tablet 3   metoprolol tartrate (LOPRESSOR) 100 MG tablet Take 1 tablet (100 mg total) by mouth 2 (two) times daily. 180 tablet 3   montelukast (SINGULAIR) 10 MG tablet Take 1 tablet (10 mg total) by mouth at bedtime. 30 tablet 3    Polyethyl Glycol-Propyl Glycol (SYSTANE OP) Place 1 drop into both eyes 3 (three) times daily as needed (dry eyes).      warfarin (COUMADIN) 5 MG tablet TAKE 1 TABLET ON SAT, SUN, TUES, & THURSTAKE 1/2 TABLET ON MON, WED, & FRIDAY 70 tablet 3   No current facility-administered medications for this visit.    Allergies:   Penicillins, Hct [hydrochlorothiazide], and Statins   ROS:  Please see the history of present illness.   Otherwise, review of systems are positive for bruising.   All other systems are reviewed and negative.    PHYSICAL EXAM: VS:  BP (!) 146/86   Pulse 78   Ht 5' 7.5" (1.715 m)   Wt 187 lb (84.8 kg)   SpO2 96%   BMI 28.86 kg/m  , BMI Body mass index is 28.86 kg/m. GENERAL:  Well appearing NECK:  No jugular venous distention, waveform within normal limits, carotid upstroke brisk and symmetric, no bruits, no thyromegaly LUNGS:  Clear to auscultation bilaterally CHEST:  Unremarkable HEART:  PMI not displaced or sustained,S1 and S2 within normal limits, no S3, no S4, no clicks, no rubs, no murmurs ABD:  Flat, positive bowel sounds normal in frequency in pitch, no bruits, no rebound, no guarding, no midline pulsatile mass, no hepatomegaly, no splenomegaly EXT:  2 plus pulses upper decreased dorsalis pedis posterior tibialis right greater than left, no edema, no cyanosis no clubbing    EKG:  EKG is not ordered today.   Recent Labs: 02/16/2022: ALT 17; BUN 19; Creatinine, Ser 1.12; Hemoglobin 14.0; Platelets 182; Potassium 4.4; Sodium 144    Lipid Panel    Component Value Date/Time   CHOL 154 02/16/2022 0947   CHOL 173 05/08/2013 0925   TRIG 96 02/16/2022 0947   TRIG 156 (H) 07/07/2016 0954   TRIG 133 05/08/2013 0925   HDL 46 02/16/2022 0947   HDL 58 07/07/2016 0954   HDL 48 05/08/2013 0925   CHOLHDL 3.3 02/16/2022 0947   LDLCALC 90 02/16/2022 0947   LDLCALC 104 (H) 07/05/2014 1003   LDLCALC 98 05/08/2013 0925      Wt Readings from Last 3 Encounters:   04/08/22 187 lb (84.8 kg)  03/30/22 186 lb (84.4 kg)  03/20/22 188 lb (85.3 kg)      Other studies Reviewed: Additional studies/ records that were reviewed today include: Labs. Review of the above records demonstrates:  Please see elsewhere  in the note.     ASSESSMENT AND PLAN:  [name]:   The patient has no new sypmtoms.  No further cardiovascular testing is indicated.  We will continue with aggressive risk reduction and meds as listed.  CM: He seems to be euvolemic.  No change in therapy.  HTN:  The blood pressure is slightly elevated but he does not tolerate med titration.  No change in therapy  HYPERLIPIDEMIA:  LDL was 90 with an HDL of 46.  No change in therapy.    ATRIAL FIBRILLATION:   [name] has a CHA2DS2 - VASc score of 4.  He gets a little tired after taking his Cardizem in the morning so we can switch to the evenings and see if this helps.   CAROTID STENOSIS:  He had a Doppler in 2018.     Current medicines are reviewed at length with the patient today.  The patient does not have concerns regarding medicines.  The following changes have been made:  no change  Labs/ tests ordered today include: None  No orders of the defined types were placed in this encounter.     Disposition:   FU with me in 12 months.     Signed, Minus Breeding, MD  04/08/2022 2:39 PM    [facility]

## 2022-04-08 ENCOUNTER — Ambulatory Visit: Payer: Medicare Other | Admitting: Cardiology

## 2022-04-08 ENCOUNTER — Encounter: Payer: Self-pay | Admitting: Cardiology

## 2022-04-08 VITALS — BP 146/86 | HR 78 | Ht 67.5 in | Wt 187.0 lb

## 2022-04-08 DIAGNOSIS — E785 Hyperlipidemia, unspecified: Secondary | ICD-10-CM | POA: Diagnosis not present

## 2022-04-08 DIAGNOSIS — I2510 Atherosclerotic heart disease of native coronary artery without angina pectoris: Secondary | ICD-10-CM

## 2022-04-08 DIAGNOSIS — I10 Essential (primary) hypertension: Secondary | ICD-10-CM

## 2022-04-08 DIAGNOSIS — I6522 Occlusion and stenosis of left carotid artery: Secondary | ICD-10-CM

## 2022-04-08 DIAGNOSIS — I420 Dilated cardiomyopathy: Secondary | ICD-10-CM | POA: Diagnosis not present

## 2022-04-08 NOTE — Patient Instructions (Signed)
Medication Instructions:  The current medical regimen is effective;  continue present plan and medications.  *If you need a refill on your cardiac medications before your next appointment, please call your pharmacy*  Follow-Up: At [facility], you and your health needs are our priority.  As part of our continuing mission to provide you with exceptional heart care, we have created designated Provider Care Teams.  These Care Teams include your primary Cardiologist (physician) and Advanced Practice Providers (APPs -  Physician Assistants and Nurse Practitioners) who all work together to provide you with the care you need, when you need it.  We recommend signing up for the patient portal called "MyChart".  Sign up information is provided on this After Visit Summary.  MyChart is used to connect with patients for Virtual Visits (Telemedicine).  Patients are able to view lab/test results, encounter notes, upcoming appointments, etc.  Non-urgent messages can be sent to your provider as well.   To learn more about what you can do with MyChart, go to [email or web address].    Your next appointment:   1 year(s)  The format for your next appointment:   In Person  Provider:   Minus Breeding, MD{   Important Information About Sugar

## 2022-04-10 ENCOUNTER — Ambulatory Visit: Payer: Medicare Other | Admitting: Family Medicine

## 2022-04-10 DIAGNOSIS — H02051 Trichiasis without entropian right upper eyelid: Secondary | ICD-10-CM | POA: Diagnosis not present

## 2022-04-16 DIAGNOSIS — H02051 Trichiasis without entropian right upper eyelid: Secondary | ICD-10-CM | POA: Diagnosis not present

## 2022-04-16 DIAGNOSIS — S0501XA Injury of conjunctiva and corneal abrasion without foreign body, right eye, initial encounter: Secondary | ICD-10-CM | POA: Diagnosis not present

## 2022-04-17 DIAGNOSIS — S0501XD Injury of conjunctiva and corneal abrasion without foreign body, right eye, subsequent encounter: Secondary | ICD-10-CM | POA: Diagnosis not present

## 2022-04-17 DIAGNOSIS — H02059 Trichiasis without entropian unspecified eye, unspecified eyelid: Secondary | ICD-10-CM | POA: Diagnosis not present

## 2022-05-01 ENCOUNTER — Encounter: Payer: Self-pay | Admitting: Family Medicine

## 2022-05-01 ENCOUNTER — Ambulatory Visit (INDEPENDENT_AMBULATORY_CARE_PROVIDER_SITE_OTHER): Payer: Medicare Other | Admitting: Family Medicine

## 2022-05-01 VITALS — BP 134/75 | HR 84 | Temp 98.3°F | Ht 67.5 in | Wt 178.0 lb

## 2022-05-01 DIAGNOSIS — I4819 Other persistent atrial fibrillation: Secondary | ICD-10-CM

## 2022-05-01 DIAGNOSIS — J329 Chronic sinusitis, unspecified: Secondary | ICD-10-CM

## 2022-05-01 DIAGNOSIS — Z7901 Long term (current) use of anticoagulants: Secondary | ICD-10-CM | POA: Diagnosis not present

## 2022-05-01 LAB — COAGUCHEK XS/INR WAIVED
INR: 2.9 — ABNORMAL HIGH (ref 0.9–1.1)
Prothrombin Time: 34.4 s

## 2022-05-01 NOTE — Progress Notes (Signed)
BP 134/75   Pulse 84   Temp 98.3 F (36.8 C)   Ht 5' 7.5" (1.715 m)   Wt 178 lb (80.7 kg)   SpO2 95%   BMI 27.47 kg/m    Subjective:   Patient ID: [name], male    DOB: 10-26-1931, 88 y.o.   MRN: [id number]  HPI: VON QUINTANAR is a 88 y.o. male presenting on 05/01/2022 for Medical Management of Chronic Issues, Atrial Fibrillation, Nasal Congestion (Present for 19m. Unsure if meds are helping), and Dizziness (Woke up last Sat with symptom. Lasted for 1.5d- unsure what caused)   HPI Coumadin recheck Target goal: 2.0-3.0 Reason on anticoagulation: Paroxysmal A-fib Patient denies any bruising or bleeding or chest pain or palpitations   Chronic nasal congestion Patient is coming in with chronic nasal congestion and difficulty breathing through his nose.  He has been using Afrin and sometimes still and is tried the different sprays including Astelin and Flonase and nasal saline and he says none of the others work is also taking Singulair did not seem to help and he still wakes up every morning congested and stopped up.  Relevant past medical, surgical, family and social history reviewed and updated as indicated. Interim medical history since our last visit reviewed. Allergies and medications reviewed and updated.  Review of Systems  Constitutional:  Negative for chills and fever.  HENT:  Positive for congestion, postnasal drip and sinus pressure. Negative for sinus pain, sneezing and sore throat.   Eyes:  Negative for visual disturbance.  Respiratory:  Negative for shortness of breath and wheezing.   Cardiovascular:  Negative for chest pain and leg swelling.  Musculoskeletal:  Negative for back pain and gait problem.  Skin:  Negative for rash.  Neurological:  Positive for dizziness. Negative for light-headedness.  All other systems reviewed and are negative.   Per HPI unless specifically indicated above   Allergies as of 05/01/2022       Reactions   Penicillins Hives,  Rash   Has patient had a PCN reaction causing immediate rash, facial/tongue/throat swelling, SOB or lightheadedness with hypotension: Yes Has patient had a PCN reaction causing severe rash involving mucus membranes or skin necrosis: Yes Has patient had a PCN reaction that required hospitalization: No Has patient had a PCN reaction occurring within the last 10 years: No If all of the above answers are "NO", then may proceed with Cephalosporin use.   Hct [hydrochlorothiazide] Other (See Comments)   hyper   Statins Other (See Comments)   Myalgia, tolerates low dosages of lovastatin         Medication List        Accurate as of May 01, 2022  2:29 PM. If you have any questions, ask your nurse or doctor.          acetaminophen 500 MG tablet Commonly known as: TYLENOL Take 1,000 mg by mouth every 6 (six) hours as needed for moderate pain.   azelastine 0.1 % nasal spray Commonly known as: ASTELIN Place 1 spray into both nostrils 2 (two) times daily. Use in each nostril as directed   cetirizine-pseudoephedrine 5-120 MG tablet Commonly known as: ZYRTEC-D Take once per day as need for congestion/runny nose   diltiazem 120 MG 24 hr capsule Commonly known as: CARDIZEM CD Take one capsule daily   ezetimibe 10 MG tablet Commonly known as: ZETIA Take 1 tablet (10 mg total) by mouth daily.   finasteride 5 MG tablet Commonly known as:  PROSCAR Take one tablet each day   fluticasone 50 MCG/ACT nasal spray Commonly known as: FLONASE Place 1 spray into both nostrils daily as needed for allergies or rhinitis.   furosemide 20 MG tablet Commonly known as: LASIX Take 1 tablet (20 mg total) by mouth daily.   lovastatin 20 MG tablet Commonly known as: MEVACOR TAKE 1/2 TABLET EVERY OTHER DAY   metoprolol tartrate 100 MG tablet Commonly known as: LOPRESSOR Take 1 tablet (100 mg total) by mouth 2 (two) times daily.   montelukast 10 MG tablet Commonly known as: SINGULAIR Take 1  tablet (10 mg total) by mouth at bedtime.   SYSTANE OP Place 1 drop into both eyes 3 (three) times daily as needed (dry eyes).   Vitamin D3 125 MCG (5000 UT) Caps Take 5,000 Units by mouth once a week.   warfarin 5 MG tablet Commonly known as: COUMADIN Take as directed by the anticoagulation clinic. If you are unsure how to take this medication, talk to your nurse or doctor. Original instructions: TAKE 1 TABLET ON SAT, SUN, TUES, & THURSTAKE 1/2 TABLET ON MON, WED, & FRIDAY         Objective:   BP 134/75   Pulse 84   Temp 98.3 F (36.8 C)   Ht 5' 7.5" (1.715 m)   Wt 178 lb (80.7 kg)   SpO2 95%   BMI 27.47 kg/m   Wt Readings from Last 3 Encounters:  05/01/22 178 lb (80.7 kg)  04/08/22 187 lb (84.8 kg)  03/30/22 186 lb (84.4 kg)    Physical Exam Vitals and nursing note reviewed.  Constitutional:      General: He is not in acute distress.    Appearance: He is well-developed. He is not diaphoretic.  Eyes:     General: No scleral icterus.    Conjunctiva/sclera: Conjunctivae normal.  Neck:     Thyroid: No thyromegaly.  Skin:    General: Skin is warm and dry.     Findings: No rash.  Neurological:     Mental Status: He is alert and oriented to person, place, and time.     Coordination: Coordination normal.  Psychiatric:        Behavior: Behavior normal.     Description   Take 1/2 tablet extra and then continue weekly dose by taking 1 tablet every day except for Mondays and Thursdays take 1/2 tablet  INR today is  2.9 (goal is 2-3)    Recheck in 4-6 weeks      Assessment & Plan:   Problem List Items Addressed This Visit       Cardiovascular and Mediastinum   Atrial fibrillation, persistent ([place]) - Primary   Relevant Orders   CoaguChek XS/INR Waived     Other   Long term current use of anticoagulant therapy   Other Visit Diagnoses     Chronic congestion of paranasal sinus       Relevant Orders   Ambulatory referral to ENT       Will refer to  ENT for congestion, no other changes today Follow up plan: Return if symptoms worsen or fail to improve, for INR repeat in 4 to 6 weeks.  Counseling provided for all of the vaccine components Orders Placed This Encounter  Procedures   CoaguChek XS/INR Waived   Ambulatory referral to ENT    Caryl Pina, MD [facility] 05/01/2022, 2:29 PM

## 2022-05-08 ENCOUNTER — Emergency Department (HOSPITAL_COMMUNITY): Payer: Medicare Other

## 2022-05-08 ENCOUNTER — Encounter (HOSPITAL_COMMUNITY): Payer: Self-pay | Admitting: *Deleted

## 2022-05-08 ENCOUNTER — Other Ambulatory Visit: Payer: Self-pay

## 2022-05-08 ENCOUNTER — Inpatient Hospital Stay (HOSPITAL_COMMUNITY)
Admission: EM | Admit: 2022-05-08 | Discharge: 2022-05-11 | DRG: 871 | Disposition: A | Discharge status: Home / Self Care | Payer: Medicare Other | Attending: Internal Medicine | Admitting: Internal Medicine

## 2022-05-08 DIAGNOSIS — Z87891 Personal history of nicotine dependence: Secondary | ICD-10-CM

## 2022-05-08 DIAGNOSIS — N390 Urinary tract infection, site not specified: Secondary | ICD-10-CM | POA: Diagnosis not present

## 2022-05-08 DIAGNOSIS — Z8249 Family history of ischemic heart disease and other diseases of the circulatory system: Secondary | ICD-10-CM

## 2022-05-08 DIAGNOSIS — E785 Hyperlipidemia, unspecified: Secondary | ICD-10-CM | POA: Diagnosis present

## 2022-05-08 DIAGNOSIS — Z8711 Personal history of peptic ulcer disease: Secondary | ICD-10-CM

## 2022-05-08 DIAGNOSIS — Z888 Allergy status to other drugs, medicaments and biological substances status: Secondary | ICD-10-CM | POA: Diagnosis not present

## 2022-05-08 DIAGNOSIS — A4151 Sepsis due to Escherichia coli [E. coli]: Principal | ICD-10-CM | POA: Diagnosis present

## 2022-05-08 DIAGNOSIS — Z96651 Presence of right artificial knee joint: Secondary | ICD-10-CM | POA: Diagnosis present

## 2022-05-08 DIAGNOSIS — I110 Hypertensive heart disease with heart failure: Secondary | ICD-10-CM | POA: Diagnosis present

## 2022-05-08 DIAGNOSIS — Z7901 Long term (current) use of anticoagulants: Secondary | ICD-10-CM | POA: Diagnosis not present

## 2022-05-08 DIAGNOSIS — Z823 Family history of stroke: Secondary | ICD-10-CM | POA: Diagnosis not present

## 2022-05-08 DIAGNOSIS — Z880 Allergy status to penicillin: Secondary | ICD-10-CM

## 2022-05-08 DIAGNOSIS — I5022 Chronic systolic (congestive) heart failure: Secondary | ICD-10-CM | POA: Diagnosis not present

## 2022-05-08 DIAGNOSIS — Z79899 Other long term (current) drug therapy: Secondary | ICD-10-CM | POA: Diagnosis not present

## 2022-05-08 DIAGNOSIS — R509 Fever, unspecified: Secondary | ICD-10-CM | POA: Diagnosis not present

## 2022-05-08 DIAGNOSIS — I10 Essential (primary) hypertension: Secondary | ICD-10-CM | POA: Diagnosis present

## 2022-05-08 DIAGNOSIS — I4819 Other persistent atrial fibrillation: Secondary | ICD-10-CM | POA: Diagnosis not present

## 2022-05-08 DIAGNOSIS — Z85828 Personal history of other malignant neoplasm of skin: Secondary | ICD-10-CM | POA: Diagnosis not present

## 2022-05-08 DIAGNOSIS — J9691 Respiratory failure, unspecified with hypoxia: Secondary | ICD-10-CM | POA: Diagnosis not present

## 2022-05-08 DIAGNOSIS — Z96643 Presence of artificial hip joint, bilateral: Secondary | ICD-10-CM | POA: Diagnosis present

## 2022-05-08 DIAGNOSIS — I2510 Atherosclerotic heart disease of native coronary artery without angina pectoris: Secondary | ICD-10-CM | POA: Diagnosis present

## 2022-05-08 DIAGNOSIS — N400 Enlarged prostate without lower urinary tract symptoms: Secondary | ICD-10-CM | POA: Diagnosis present

## 2022-05-08 DIAGNOSIS — A419 Sepsis, unspecified organism: Secondary | ICD-10-CM | POA: Diagnosis not present

## 2022-05-08 DIAGNOSIS — E876 Hypokalemia: Secondary | ICD-10-CM | POA: Diagnosis not present

## 2022-05-08 DIAGNOSIS — Z801 Family history of malignant neoplasm of trachea, bronchus and lung: Secondary | ICD-10-CM | POA: Diagnosis not present

## 2022-05-08 DIAGNOSIS — Z951 Presence of aortocoronary bypass graft: Secondary | ICD-10-CM

## 2022-05-08 DIAGNOSIS — I509 Heart failure, unspecified: Secondary | ICD-10-CM

## 2022-05-08 DIAGNOSIS — I4891 Unspecified atrial fibrillation: Secondary | ICD-10-CM | POA: Diagnosis not present

## 2022-05-08 LAB — CBC WITH DIFFERENTIAL/PLATELET
Abs Immature Granulocytes: 0.18 10*3/uL — ABNORMAL HIGH (ref 0.00–0.07)
Basophils Absolute: 0 10*3/uL (ref 0.0–0.1)
Basophils Relative: 0 %
Eosinophils Absolute: 0 10*3/uL (ref 0.0–0.5)
Eosinophils Relative: 0 %
HCT: 37.7 % — ABNORMAL LOW (ref 39.0–52.0)
Hemoglobin: 12.4 g/dL — ABNORMAL LOW (ref 13.0–17.0)
Immature Granulocytes: 1 %
Lymphocytes Relative: 14 %
Lymphs Abs: 2.3 10*3/uL (ref 0.7–4.0)
MCH: 33.3 pg (ref 26.0–34.0)
MCHC: 32.9 g/dL (ref 30.0–36.0)
MCV: 101.3 fL — ABNORMAL HIGH (ref 80.0–100.0)
Monocytes Absolute: 0.8 10*3/uL (ref 0.1–1.0)
Monocytes Relative: 5 %
Neutro Abs: 13.5 10*3/uL — ABNORMAL HIGH (ref 1.7–7.7)
Neutrophils Relative %: 80 %
Platelets: 246 10*3/uL (ref 150–400)
RBC: 3.72 MIL/uL — ABNORMAL LOW (ref 4.22–5.81)
RDW: 12.5 % (ref 11.5–15.5)
WBC: 16.9 10*3/uL — ABNORMAL HIGH (ref 4.0–10.5)
nRBC: 0 % (ref 0.0–0.2)

## 2022-05-08 LAB — COMPREHENSIVE METABOLIC PANEL
ALT: 19 U/L (ref 0–44)
AST: 18 U/L (ref 15–41)
Albumin: 3.4 g/dL — ABNORMAL LOW (ref 3.5–5.0)
Alkaline Phosphatase: 51 U/L (ref 38–126)
Anion gap: 8 (ref 5–15)
BUN: 19 mg/dL (ref 8–23)
CO2: 24 mmol/L (ref 22–32)
Calcium: 8.6 mg/dL — ABNORMAL LOW (ref 8.9–10.3)
Chloride: 105 mmol/L (ref 98–111)
Creatinine, Ser: 1.04 mg/dL (ref 0.61–1.24)
GFR, Estimated: >60 mL/min (ref >60)
Glucose, Bld: 106 mg/dL — ABNORMAL HIGH (ref 70–99)
Potassium: 3.3 mmol/L — ABNORMAL LOW (ref 3.5–5.1)
Sodium: 137 mmol/L (ref 135–145)
Total Bilirubin: 1.4 mg/dL — ABNORMAL HIGH (ref 0.3–1.2)
Total Protein: 6.7 g/dL (ref 6.5–8.1)

## 2022-05-08 LAB — URINALYSIS, ROUTINE W REFLEX MICROSCOPIC
Bilirubin Urine: NEGATIVE
Glucose, UA: NEGATIVE mg/dL
Ketones, ur: 5 mg/dL — AB
Nitrite: POSITIVE — AB
Protein, ur: 30 mg/dL — AB
Specific Gravity, Urine: 1.013 (ref 1.005–1.030)
WBC, UA: >50 WBC/hpf — ABNORMAL HIGH (ref 0–5)
pH: 6 (ref 5.0–8.0)

## 2022-05-08 LAB — LACTIC ACID, PLASMA
Lactic Acid, Venous: 1.3 mmol/L (ref 0.5–1.9)
Lactic Acid, Venous: 0.9 mmol/L (ref 0.5–1.9)

## 2022-05-08 LAB — PROTIME-INR
INR: 2.5 — ABNORMAL HIGH (ref 0.8–1.2)
Prothrombin Time: 27.1 seconds — ABNORMAL HIGH (ref 11.4–15.2)

## 2022-05-08 LAB — APTT: aPTT: 35 seconds (ref 24–36)

## 2022-05-08 MED ORDER — ACETAMINOPHEN 650 MG RE SUPP: 650.0000 mg | Freq: Four times a day (QID) | RECTAL | Status: DC | PRN

## 2022-05-08 MED ORDER — ONDANSETRON HCL 4 MG PO TABS: 4.0000 mg | ORAL_TABLET | Freq: Four times a day (QID) | ORAL | Status: DC | PRN

## 2022-05-08 MED ORDER — CIPROFLOXACIN IN D5W 400 MG/200ML IV SOLN
400.0000 mg | Freq: Once | INTRAVENOUS | Status: AC
  Administered 2022-05-08: 400 mg via INTRAVENOUS
  Filled 2022-05-08: qty 200

## 2022-05-08 MED ORDER — SODIUM CHLORIDE 0.9 % IV BOLUS
1000.0000 mL | Freq: Once | INTRAVENOUS | Status: AC
  Administered 2022-05-08: 1000 mL via INTRAVENOUS

## 2022-05-08 MED ORDER — SODIUM CHLORIDE 0.9 % IV SOLN
1.0000 g | INTRAVENOUS | Status: DC
  Administered 2022-05-08: 1 g via INTRAVENOUS
  Filled 2022-05-08: qty 10

## 2022-05-08 MED ORDER — DILTIAZEM HCL ER COATED BEADS 120 MG PO CP24
120.0000 mg | ORAL_CAPSULE | Freq: Every day | ORAL | Status: DC
  Administered 2022-05-09 – 2022-05-11 (×3): 120 mg via ORAL
  Filled 2022-05-08 (×3): qty 1

## 2022-05-08 MED ORDER — LACTATED RINGERS IV BOLUS (SEPSIS)
1000.0000 mL | Freq: Once | INTRAVENOUS | Status: AC
  Administered 2022-05-08: 1000 mL via INTRAVENOUS

## 2022-05-08 MED ORDER — LACTATED RINGERS IV BOLUS
1000.0000 mL | Freq: Once | INTRAVENOUS | Status: DC
Start: 2022-05-08 — End: 2022-05-08

## 2022-05-08 MED ORDER — SALINE SPRAY 0.65 % NA SOLN
1.0000 | NASAL | Status: DC | PRN
Start: 2022-05-08 — End: 2022-05-11
  Administered 2022-05-08: 1 via NASAL
  Filled 2022-05-08: qty 44

## 2022-05-08 MED ORDER — FINASTERIDE 5 MG PO TABS
5.0000 mg | ORAL_TABLET | Freq: Every day | ORAL | Status: DC
  Administered 2022-05-08 – 2022-05-11 (×4): 5 mg via ORAL
  Filled 2022-05-08 (×4): qty 1

## 2022-05-08 MED ORDER — ONDANSETRON HCL 4 MG/2ML IJ SOLN: 4.0000 mg | Freq: Four times a day (QID) | INTRAMUSCULAR | Status: DC | PRN

## 2022-05-08 MED ORDER — METOPROLOL TARTRATE 50 MG PO TABS
100.0000 mg | ORAL_TABLET | Freq: Two times a day (BID) | ORAL | Status: DC
  Administered 2022-05-08 – 2022-05-11 (×6): 100 mg via ORAL
  Filled 2022-05-08 (×6): qty 2

## 2022-05-08 MED ORDER — EZETIMIBE 10 MG PO TABS
10.0000 mg | ORAL_TABLET | Freq: Every day | ORAL | Status: DC
  Administered 2022-05-09 – 2022-05-11 (×3): 10 mg via ORAL
  Filled 2022-05-08 (×3): qty 1

## 2022-05-08 MED ORDER — ACETAMINOPHEN 325 MG PO TABS
650.0000 mg | ORAL_TABLET | Freq: Four times a day (QID) | ORAL | Status: DC | PRN
  Administered 2022-05-09 – 2022-05-10 (×4): 650 mg via ORAL
  Filled 2022-05-08 (×4): qty 2

## 2022-05-08 MED ORDER — SODIUM CHLORIDE 0.9 % IV SOLN: INTRAVENOUS | Status: DC

## 2022-05-08 MED ORDER — MONTELUKAST SODIUM 10 MG PO TABS
10.0000 mg | ORAL_TABLET | Freq: Every day | ORAL | Status: DC
  Administered 2022-05-08 – 2022-05-10 (×3): 10 mg via ORAL
  Filled 2022-05-08 (×3): qty 1

## 2022-05-08 MED ORDER — PRAVASTATIN SODIUM 10 MG PO TABS
20.0000 mg | ORAL_TABLET | ORAL | Status: DC
  Administered 2022-05-08 – 2022-05-10 (×2): 20 mg via ORAL
  Filled 2022-05-08 (×2): qty 2

## 2022-05-08 NOTE — ED Notes (Signed)
X-ray at bedside

## 2022-05-08 NOTE — ED Provider Notes (Signed)
[facility] EMERGENCY DEPARTMENT Provider Note   CSN: [id number] Arrival date & time: 05/08/22  1350     History  Chief Complaint  Patient presents with   Fever    [name] is a 88 y.o. male with history of A-fib on warfarin, hypertension, [name] who presents to the emergency department for evaluation of fevers on and off for approximately 2 weeks.  100.9 F at home today.  Patient also complains of some low pelvic pain, nausea and burning with urination that started 8 days ago.  Denies blood in the urine.  Denies chest pain, shortness of breath, vomiting and diarrhea.  Patient reports he saw a urologist approximately 5 years ago who told him that he had prostate issues, but nothing that required treatment at that time.  Patient lives at home with his wife.  He is accompanied here today by his daughter.   Fever Associated symptoms: dysuria   Associated symptoms: no diarrhea, no headaches, no nausea and no vomiting        Home Medications Prior to Admission medications   Medication Sig Start Date End Date Taking? Authorizing Provider  acetaminophen (TYLENOL) 500 MG tablet Take 1,000 mg by mouth every 6 (six) hours as needed for moderate pain.    [provider]  azelastine (ASTELIN) 0.1 % nasal spray Place 1 spray into both nostrils 2 (two) times daily. Use in each nostril as directed 03/30/22   [name], Fransisca Kaufmann, MD  cetirizine-pseudoephedrine (ZYRTEC-D) 5-120 MG tablet Take once per day as need for congestion/runny nose 01/15/22   Lajean Saver, MD  Cholecalciferol (VITAMIN D3) 5000 units CAPS Take 5,000 Units by mouth once a week.     [provider]  diltiazem (CARDIZEM CD) 120 MG 24 hr capsule Take one capsule daily 08/13/21   [name], Fransisca Kaufmann, MD  ezetimibe (ZETIA) 10 MG tablet Take 1 tablet (10 mg total) by mouth daily. 12/18/21   [name], Fransisca Kaufmann, MD  finasteride (PROSCAR) 5 MG tablet Take one tablet each day 08/13/21   [name], Fransisca Kaufmann, MD   fluticasone (FLONASE) 50 MCG/ACT nasal spray Place 1 spray into both nostrils daily as needed for allergies or rhinitis.    [provider]  furosemide (LASIX) 20 MG tablet Take 1 tablet (20 mg total) by mouth daily. 12/18/21   [name], Fransisca Kaufmann, MD  lovastatin (MEVACOR) 20 MG tablet TAKE 1/2 TABLET EVERY OTHER DAY 08/13/21   [name], Fransisca Kaufmann, MD  metoprolol tartrate (LOPRESSOR) 100 MG tablet Take 1 tablet (100 mg total) by mouth 2 (two) times daily. 09/17/21   Minus Breeding, MD  montelukast (SINGULAIR) 10 MG tablet Take 1 tablet (10 mg total) by mouth at bedtime. 03/30/22   [name], Fransisca Kaufmann, MD  Polyethyl Glycol-Propyl Glycol (SYSTANE OP) Place 1 drop into both eyes 3 (three) times daily as needed (dry eyes).     [provider]  warfarin (COUMADIN) 5 MG tablet TAKE 1 TABLET ON SAT, SUN, TUES, & THURSTAKE 1/2 TABLET ON MON, WED, & FRIDAY 12/18/21   [name], Fransisca Kaufmann, MD      Allergies    Penicillins, Hct [hydrochlorothiazide], and Statins    Review of Systems   Review of Systems  Constitutional:  Positive for fever.  Gastrointestinal:  Positive for abdominal pain. Negative for diarrhea, nausea and vomiting.  Genitourinary:  Positive for dysuria. Negative for hematuria.  Neurological:  Negative for headaches.    Physical Exam Updated Vital Signs BP 135/81   Pulse 96  Temp 98.7 F (37.1 C) (Oral)   Resp 20   Ht 5\' 6"  (1.676 m)   Wt 83.3 kg   SpO2 95%   BMI 29.63 kg/m  Physical Exam Vitals and nursing note reviewed.  Constitutional:      General: He is not in acute distress.    Appearance: He is not ill-appearing.  HENT:     Head: Atraumatic.  Eyes:     Conjunctiva/sclera: Conjunctivae normal.  Cardiovascular:     Rate and Rhythm: Regular rhythm. Tachycardia present.     Pulses: Normal pulses.          Radial pulses are 2+ on the right side and 2+ on the left side.     Heart sounds: No murmur heard. Pulmonary:     Effort: Pulmonary effort is  normal. No respiratory distress.     Breath sounds: Normal breath sounds.  Abdominal:     General: Abdomen is flat. There is no distension.     Palpations: Abdomen is soft.     Tenderness: There is abdominal tenderness in the suprapubic area. There is no right CVA tenderness or left CVA tenderness. Negative signs include Murphy's sign and McBurney's sign.  Musculoskeletal:        General: Normal range of motion.     Cervical back: Normal range of motion.  Skin:    General: Skin is warm and dry.     Capillary Refill: Capillary refill takes less than 2 seconds.  Neurological:     General: No focal deficit present.     Mental Status: He is alert.  Psychiatric:        Mood and Affect: Mood normal.     ED Results / Procedures / Treatments   Labs (all labs ordered are listed, but only abnormal results are displayed) Labs Reviewed  URINALYSIS, ROUTINE W REFLEX MICROSCOPIC - Abnormal; Notable for the following components:      Result Value   APPearance HAZY (*)    Hgb urine dipstick LARGE (*)    Ketones, ur 5 (*)    Protein, ur 30 (*)    Nitrite POSITIVE (*)    Leukocytes,Ua LARGE (*)    WBC, UA >50 (*)    Bacteria, UA FEW (*)    All other components within normal limits  COMPREHENSIVE METABOLIC PANEL - Abnormal; Notable for the following components:   Potassium 3.3 (*)    Glucose, Bld 106 (*)    Calcium 8.6 (*)    Albumin 3.4 (*)    Total Bilirubin 1.4 (*)    All other components within normal limits  CBC WITH DIFFERENTIAL/PLATELET - Abnormal; Notable for the following components:   WBC 16.9 (*)    RBC 3.72 (*)    Hemoglobin 12.4 (*)    HCT 37.7 (*)    MCV 101.3 (*)    Neutro Abs 13.5 (*)    Abs Immature Granulocytes 0.18 (*)    All other components within normal limits  PROTIME-INR - Abnormal; Notable for the following components:   Prothrombin Time 27.1 (*)    INR 2.5 (*)    All other components within normal limits  CULTURE, BLOOD (ROUTINE X 2)  CULTURE, BLOOD  (ROUTINE X 2)  URINE CULTURE  LACTIC ACID, PLASMA  APTT  LACTIC ACID, PLASMA    EKG EKG Interpretation  Date/Time:  Friday May 08 2022 14:12:38 EDT Ventricular Rate:  118 PR Interval:    QRS Duration: 88 QT Interval:  312 QTC Calculation:  438 R Axis:   23 Text Interpretation: Atrial fibrillation Nonspecific repol abnormality, diffuse leads Since last tracing rate faster Confirmed by Noemi Chapel [phone]) on 05/08/2022 2:43:01 PM  Radiology DG Chest Port 1 View  Result Date: 05/08/2022 CLINICAL DATA:  Fever on off for 2 weeks EXAM: PORTABLE CHEST 1 VIEW COMPARISON:  01/15/2022 FINDINGS: Bilateral mild chronic interstitial thickening. No focal consolidation. No pleural effusion or pneumothorax. Heart and mediastinal contours are unremarkable. Prior CABG. Thoracic aortic atherosclerosis. No acute osseous abnormality. IMPRESSION: 1. No acute cardiopulmonary disease. Electronically Signed   By: Kathreen Devoid M.D.   On: 05/08/2022 14:59    Procedures .Critical Care  Performed by: Tonye Pearson, PA-C Authorized by: Tonye Pearson, PA-C   Critical care provider statement:    Critical care time (minutes):  30   Critical care start time:  05/08/2022 2:30 PM   Critical care end time:  05/08/2022 3:00 PM   Critical care was necessary to treat or prevent imminent or life-threatening deterioration of the following conditions:  Sepsis   Critical care was time spent personally by me on the following activities:  Development of treatment plan with patient or surrogate, discussions with consultants, evaluation of patient's response to treatment, examination of patient, ordering and review of laboratory studies, ordering and review of radiographic studies, ordering and performing treatments and interventions, pulse oximetry, re-evaluation of patient's condition and review of old charts   I assumed direction of critical care for this patient from another provider in my specialty: no     Care  discussed with: admitting provider       Medications Ordered in ED Medications  ciprofloxacin (CIPRO) IVPB 400 mg (400 mg Intravenous New Bag/Given 05/08/22 1727)  lactated ringers bolus 1,000 mL (0 mLs Intravenous Stopped 05/08/22 1620)    ED Course/ Medical Decision Making/ A&P                           Medical Decision Making Amount and/or Complexity of Data Reviewed Labs: ordered. Radiology: ordered. ECG/medicine tests: ordered.  Risk Prescription drug management. Decision regarding hospitalization.   Social determinants of health:  Social History   Socioeconomic History   Marital status: Married    Spouse name: [name]   Number of children: 1   Years of education: 10   Highest education level: 10th grade  Occupational History   Occupation: retired  Tobacco Use   Smoking status: Former    Packs/day: 2.00    Years: 5.00    Total pack years: 10.00    Types: Cigarettes    Quit date: 12/23/1963    Years since quitting: 58.4   Smokeless tobacco: Never  Vaping Use   Vaping Use: Never used  Substance and Sexual Activity   Alcohol use: No   Drug use: No   Sexual activity: Yes  Other Topics Concern   Not on file  Social History Narrative   Lives in a split level home.   Social Determinants of Health   Financial Resource Strain: Low Risk  (12/11/2021)   Overall Financial Resource Strain (CARDIA)    Difficulty of Paying Living Expenses: Not hard at all  Food Insecurity: No Food Insecurity (12/11/2021)   Hunger Vital Sign    Worried About Running Out of Food in the Last Year: Never true    Ran Out of Food in the Last Year: Never true  Transportation Needs: No Transportation Needs (12/11/2021)   PRAPARE -  Hydrologist (Medical): No    Lack of Transportation (Non-Medical): No  Physical Activity: Insufficiently Active (12/11/2021)   Exercise Vital Sign    Days of Exercise per Week: 7 days    Minutes of Exercise per Session: 20 min  Stress:  No Stress Concern Present (12/11/2021)   [place]    Feeling of Stress : Only a little  Recent Concern: Stress - Stress Concern Present (10/31/2021)   [place]    Feeling of Stress : To some extent  Social Connections: Moderately Isolated (12/11/2021)   Social Connection and Isolation Panel [NHANES]    Frequency of Communication with Friends and Family: More than three times a week    Frequency of Social Gatherings with Friends and Family: More than three times a week    Attends Religious Services: Never    Marine scientist or Organizations: No    Attends Archivist Meetings: Never    Marital Status: Married  Human resources officer Violence: Not At Risk (12/11/2021)   Humiliation, Afraid, Rape, and Kick questionnaire    Fear of Current or Ex-Partner: No    Emotionally Abused: No    Physically Abused: No    Sexually Abused: No     Initial impression:  This patient presents to the ED for concern of fevers with dysuria, this involves an extensive number of treatment options, and is a complaint that carries with it a high risk of complications and morbidity.   Differentials include sepsis due to UTI, [name], prostate cancer, urinary retention, dehydration.   Comorbidities affecting care:  A-fib on warfarin, hypertension, [name]  Additional history obtained: Daughter at bedside  Lab Tests  I Ordered, reviewed, and interpreted labs and EKG.  The pertinent results include:  Leukocytosis 16.9 UTI Normal lactic acid  Imaging Studies ordered:  I ordered imaging studies including  Chest x-ray without acute findings I independently visualized and interpreted imaging and I agree with the radiologist interpretation.   EKG: A-fib  Cardiac Monitoring:  The patient was maintained on a cardiac monitor.  I personally viewed and interpreted the cardiac  monitored which showed an underlying rhythm of: A-fib with heart rate between 110 and 130   Medicines ordered and prescription drug management:  I ordered medication including: LR bolus 1 L Ciprofloxacin 400 mg IV Reevaluation of the patient after these medicines showed that the patient improved I have reviewed the patients home medicines and have made adjustments as needed   Critical Interventions:  Sepsis treatment  Consultations Obtained:  I requested consultation with hospitalist,  and discussed lab and imaging findings as well as pertinent plan - they agree with plan for admission   ED Course/Re-evaluation: Presents in no acute distress and is nontoxic-appearing.  Notable for tachycardia to 128 upon arrival and is afebrile. Satting well on room air.  On exam, he has suprapubic tenderness to palpation with negative CVA tenderness bilaterally.  Physical exam otherwise without acute findings.  I believe his tachycardia is more related to possible underlying sepsis given his fevers rather than A-fib with RVR.  He was given 1 L fluid bolus with improvement of heart rate down into the 90s.  Labs returned with CBC of 16.9 and evidence of large urinary tract infection.  Lactic acid was normal.  Pending blood cultures and urine cultures.  Kidney function normal.  I started ciprofloxacin 400 mg IV.  Patient now meets sepsis criteria.  Plan to admit for IV antibiotic therapy.  Patient expresses understanding and is amenable to this plan. Disposition:  After consideration of the diagnostic results, physical exam, history and the patients response to treatment feel that the patent would benefit from admission.   Sepsis secondary to UTI: Plan and management as described above. Discharged home in good condition.  Final Clinical Impression(s) / ED Diagnoses Final diagnoses:  Sepsis secondary to UTI [facility])    Rx / DC Orders ED Discharge Orders     None         Rodena Piety 05/08/22 1756    Noemi Chapel, MD 05/11/22 1156

## 2022-05-08 NOTE — H&P (Signed)
History and Physical    Patient: [name] [id number]:[id number] DOB: Sep 30, 1932 DOA: 05/08/2022 DOS: the patient was seen and examined on 05/08/2022 PCP: [name], Fransisca Kaufmann, MD  Patient coming from: Home  Chief Complaint:  Chief Complaint  Patient presents with   Fever   HPI: [name] is a 88 y.o. male with medical history significant of atrial fibrillation on Coumadin, hypertension, heart failure with reduced EF, [name].  Patient seen for intermittent dysuria for the past month with intermittent fevers.  He saw his primary care physician over the past few weeks without any work-up.  Since 4 days ago, the patient has had persistent dysuria with increasing fevers and lower abdominal pain.  No palliating or provoking factors.  Patient presented for evaluation.  Review of Systems: As mentioned in the history of present illness. All other systems reviewed and are negative. Past Medical History:  Diagnosis Date   Anemia    Atrial fibrillation (HCC)    Cataract    Cellulitis    In past   Complication of anesthesia    HARD TIME WAKING; CAUSES MY BP TO GO UP    Coronary artery disease    5 bypasses   DJD (degenerative joint disease)    Ejection fraction < 50%    Mildly reduced, 40% by echo   GERD (gastroesophageal reflux disease)    Hyperlipidemia    Hypertension    Persistent atrial fibrillation ([place])    Prostate hypertrophy    on CT scan 09/2014   PUD (peptic ulcer disease)    RESOLVED    Skin cancer of eyelid    Resected   Past Surgical History:  Procedure Laterality Date   APPENDECTOMY     BYPASS GRAFT  2005   CATARACT EXTRACTION W/PHACO Left 07/22/2015   Procedure: CATARACT EXTRACTION PHACO AND INTRAOCULAR LENS PLACEMENT LEFT EYE CDE=8.14;  Surgeon: Tonny Branch, MD;  Location: [facility];  Service: Ophthalmology;  Laterality: Left;   CATARACT EXTRACTION W/PHACO Right 08/18/2019   Procedure: CATARACT EXTRACTION PHACO AND INTRAOCULAR LENS PLACEMENT (IOC);  Surgeon: Baruch Goldmann,  MD;  Location: [facility];  Service: Ophthalmology;  Laterality: Right;  CDE: 9.74   CORONARY ARTERY BYPASS GRAFT  4/05   LIMA to LAD, SVG to PDA, SVG to ramus intermediate   EYE SURGERY  07/2015   EYELID CARCINOMA EXCISION     Skin cancer resected   Heart bypass  2005   TOTAL HIP ARTHROPLASTY     Right   TOTAL HIP ARTHROPLASTY Left 05/04/2018   Procedure: LEFT TOTAL HIP ARTHROPLASTY;  Surgeon: [name], MD;  Location: [facility];  Service: Orthopedics;  Laterality: Left;   TOTAL KNEE ARTHROPLASTY     Right   Social History:  reports that he quit smoking about 58 years ago. His smoking use included cigarettes. He has a 10.00 pack-year smoking history. He has never used smokeless tobacco. He reports that he does not drink alcohol and does not use drugs.  Allergies  Allergen Reactions   Penicillins Hives and Rash    Has patient had a PCN reaction causing immediate rash, facial/tongue/throat swelling, SOB or lightheadedness with hypotension: Yes Has patient had a PCN reaction causing severe rash involving mucus membranes or skin necrosis: Yes Has patient had a PCN reaction that required hospitalization: No Has patient had a PCN reaction occurring within the last 10 years: No If all of the above answers are "NO", then may proceed with Cephalosporin use.    Hct [Hydrochlorothiazide]  Other (See Comments)    hyper   Statins Other (See Comments)    Myalgia, tolerates low dosages of lovastatin     Family History  Problem Relation Age of Onset   Stroke Mother    Stroke Father    Hypertension Father    Early death Brother        74 months old   Cancer Brother        lung   Stroke Brother        heat    Prior to Admission medications   Medication Sig Start Date End Date Taking? Authorizing Provider  acetaminophen (TYLENOL) 500 MG tablet Take 1,000 mg by mouth every 6 (six) hours as needed for moderate pain.   Yes [provider]  azelastine (ASTELIN) 0.1 % nasal spray Place  1 spray into both nostrils 2 (two) times daily. Use in each nostril as directed 03/30/22  Yes [name], Fransisca Kaufmann, MD  cetirizine-pseudoephedrine (ZYRTEC-D) 5-120 MG tablet Take once per day as need for congestion/runny nose 01/15/22  Yes Lajean Saver, MD  Cholecalciferol (VITAMIN D3) 5000 units CAPS Take 5,000 Units by mouth once a week.    Yes [provider]  diltiazem (CARDIZEM CD) 120 MG 24 hr capsule Take one capsule daily 08/13/21  Yes [name], Fransisca Kaufmann, MD  ezetimibe (ZETIA) 10 MG tablet Take 1 tablet (10 mg total) by mouth daily. 12/18/21  Yes [name], Fransisca Kaufmann, MD  finasteride (PROSCAR) 5 MG tablet Take one tablet each day 08/13/21  Yes [name], Fransisca Kaufmann, MD  furosemide (LASIX) 20 MG tablet Take 1 tablet (20 mg total) by mouth daily. 12/18/21  Yes [name], Fransisca Kaufmann, MD  lovastatin (MEVACOR) 20 MG tablet TAKE 1/2 TABLET EVERY OTHER DAY 08/13/21  Yes [name], Fransisca Kaufmann, MD  metoprolol tartrate (LOPRESSOR) 100 MG tablet Take 1 tablet (100 mg total) by mouth 2 (two) times daily. 09/17/21  Yes Minus Breeding, MD  montelukast (SINGULAIR) 10 MG tablet Take 1 tablet (10 mg total) by mouth at bedtime. 03/30/22  Yes [name], Fransisca Kaufmann, MD  Polyethyl Glycol-Propyl Glycol (SYSTANE OP) Place 1 drop into both eyes 3 (three) times daily as needed (dry eyes).    Yes [provider]  warfarin (COUMADIN) 5 MG tablet TAKE 1 TABLET ON SAT, SUN, TUES, & THURSTAKE 1/2 TABLET ON MON, WED, & FRIDAY 12/18/21  Yes [name], Fransisca Kaufmann, MD  fluticasone (FLONASE) 50 MCG/ACT nasal spray Place 1 spray into both nostrils daily as needed for allergies or rhinitis.    [provider]    Physical Exam: Vitals:   05/08/22 1630 05/08/22 1700 05/08/22 1730 05/08/22 1800  BP: (!) 136/59 135/81 (!) 142/80 (!) 163/91  Pulse: (!) 101 96 (!) 106 (!) 113  Resp:   (!) 23 15  Temp:      TempSrc:      SpO2: 93% 95% 96% 96%  Weight:      Height:       General: Elderly male. Awake and alert and oriented  x3. No acute cardiopulmonary distress.  HEENT: Normocephalic atraumatic.  Right and left ears normal in appearance.  Pupils equal, round, reactive to light. Extraocular muscles are intact. Sclerae anicteric and noninjected.  Moist mucosal membranes. No mucosal lesions.  Neck: Neck supple without lymphadenopathy. No carotid bruits. No masses palpated.  Cardiovascular: Tachycardic irregularly irregular rate. No murmurs, rubs, gallops auscultated. No JVD.  Respiratory: Good respiratory effort with no wheezes, rales, rhonchi. Lungs clear to auscultation bilaterally.  No  accessory muscle use. Abdomen: Soft, suprapubic tenderness without rebound or guarding. Nondistended. Active bowel sounds. No masses or hepatosplenomegaly  Skin: No rashes, lesions, or ulcerations.  Dry, warm to touch. 2+ dorsalis pedis and radial pulses. Musculoskeletal: No calf or leg pain. All major joints not erythematous nontender.  No upper or lower joint deformation.  Good ROM.  No contractures  Psychiatric: Intact judgment and insight. Pleasant and cooperative. Neurologic: No focal neurological deficits. Strength is 5/5 and symmetric in upper and lower extremities.  Cranial nerves II through XII are grossly intact.  Data Reviewed: Results for orders placed or performed during the hospital encounter of 05/08/22 (from the past 24 hour(s))  Comprehensive metabolic panel     Status: Abnormal   Collection Time: 05/08/22  2:27 PM  Result Value Ref Range   Sodium 137 135 - 145 mmol/L   Potassium 3.3 (L) 3.5 - 5.1 mmol/L   Chloride 105 98 - 111 mmol/L   CO2 24 22 - 32 mmol/L   Glucose, Bld 106 (H) 70 - 99 mg/dL   BUN 19 8 - 23 mg/dL   Creatinine, Ser 1.04 0.61 - 1.24 mg/dL   Calcium 8.6 (L) 8.9 - 10.3 mg/dL   Total Protein 6.7 6.5 - 8.1 g/dL   Albumin 3.4 (L) 3.5 - 5.0 g/dL   AST 18 15 - 41 U/L   ALT 19 0 - 44 U/L   Alkaline Phosphatase 51 38 - 126 U/L   Total Bilirubin 1.4 (H) 0.3 - 1.2 mg/dL   GFR, Estimated >60 >60 mL/min    Anion gap 8 5 - 15  CBC with Differential     Status: Abnormal   Collection Time: 05/08/22  2:27 PM  Result Value Ref Range   WBC 16.9 (H) 4.0 - 10.5 K/uL   RBC 3.72 (L) 4.22 - 5.81 MIL/uL   Hemoglobin 12.4 (L) 13.0 - 17.0 g/dL   HCT 37.7 (L) 39.0 - 52.0 %   MCV 101.3 (H) 80.0 - 100.0 fL   MCH 33.3 26.0 - 34.0 pg   MCHC 32.9 30.0 - 36.0 g/dL   RDW 12.5 11.5 - 15.5 %   Platelets 246 150 - 400 K/uL   nRBC 0.0 0.0 - 0.2 %   Neutrophils Relative % 80 %   Neutro Abs 13.5 (H) 1.7 - 7.7 K/uL   Lymphocytes Relative 14 %   Lymphs Abs 2.3 0.7 - 4.0 K/uL   Monocytes Relative 5 %   Monocytes Absolute 0.8 0.1 - 1.0 K/uL   Eosinophils Relative 0 %   Eosinophils Absolute 0.0 0.0 - 0.5 K/uL   Basophils Relative 0 %   Basophils Absolute 0.0 0.0 - 0.1 K/uL   Immature Granulocytes 1 %   Abs Immature Granulocytes 0.18 (H) 0.00 - 0.07 K/uL  Protime-INR     Status: Abnormal   Collection Time: 05/08/22  2:45 PM  Result Value Ref Range   Prothrombin Time 27.1 (H) 11.4 - 15.2 seconds   INR 2.5 (H) 0.8 - 1.2  APTT     Status: None   Collection Time: 05/08/22  2:45 PM  Result Value Ref Range   aPTT 35 24 - 36 seconds  Blood Culture (routine x 2)     Status: None (Preliminary result)   Collection Time: 05/08/22  2:45 PM   Specimen: Left Antecubital; Blood  Result Value Ref Range   Specimen Description      LEFT ANTECUBITAL BOTTLES DRAWN AEROBIC AND ANAEROBIC   Special Requests  Blood Culture results may not be optimal due to an excessive volume of blood received in culture bottles Performed at [facility], [address] [place], [place] [address]    Culture PENDING    Report Status PENDING   Lactic acid, plasma     Status: None   Collection Time: 05/08/22  3:24 PM  Result Value Ref Range   Lactic Acid, Venous 1.3 0.5 - 1.9 mmol/L  Blood Culture (routine x 2)     Status: None (Preliminary result)   Collection Time: 05/08/22  3:24 PM   Specimen: BLOOD LEFT HAND  Result Value Ref  Range   Specimen Description      BLOOD LEFT HAND BOTTLES DRAWN AEROBIC AND ANAEROBIC   Special Requests      Blood Culture results may not be optimal due to an excessive volume of blood received in culture bottles Performed at [facility], [address] [place], [place] [address]    Culture PENDING    Report Status PENDING   Urinalysis, Routine w reflex microscopic Urine, Clean Catch     Status: Abnormal   Collection Time: 05/08/22  3:35 PM  Result Value Ref Range   Color, Urine YELLOW YELLOW   APPearance HAZY (A) CLEAR   Specific Gravity, Urine 1.013 1.005 - 1.030   pH 6.0 5.0 - 8.0   Glucose, UA NEGATIVE NEGATIVE mg/dL   Hgb urine dipstick LARGE (A) NEGATIVE   Bilirubin Urine NEGATIVE NEGATIVE   Ketones, ur 5 (A) NEGATIVE mg/dL   Protein, ur 30 (A) NEGATIVE mg/dL   Nitrite POSITIVE (A) NEGATIVE   Leukocytes,Ua LARGE (A) NEGATIVE   RBC / HPF 6-10 0 - 5 RBC/hpf   WBC, UA >50 (H) 0 - 5 WBC/hpf   Bacteria, UA FEW (A) NONE SEEN   Mucus PRESENT   Lactic acid, plasma     Status: None   Collection Time: 05/08/22  5:25 PM  Result Value Ref Range   Lactic Acid, Venous 0.9 0.5 - 1.9 mmol/L   DG Chest Port 1 View  Result Date: 05/08/2022 CLINICAL DATA:  Fever on off for 2 weeks EXAM: PORTABLE CHEST 1 VIEW COMPARISON:  01/15/2022 FINDINGS: Bilateral mild chronic interstitial thickening. No focal consolidation. No pleural effusion or pneumothorax. Heart and mediastinal contours are unremarkable. Prior CABG. Thoracic aortic atherosclerosis. No acute osseous abnormality. IMPRESSION: 1. No acute cardiopulmonary disease. Electronically Signed   By: Kathreen Devoid M.D.   On: 05/08/2022 14:59     Assessment and Plan: No notes have been filed under this hospital service. Service: Hospitalist  Principal Problem:   Sepsis secondary to UTI [facility]) Active Problems:   Essential hypertension   Long term current use of anticoagulant therapy   [name] with elevated PSA   CHF (congestive heart failure)  (HCC)  Sepsis secondary to UTI Admit Urine culture cultures obtained Patient given ciprofloxacin due to penicillin allergy.  However, the patient has tolerated Rocephin in the past.  We will switch the patient to Rocephin as this has a lower incidence of complication rates with older patients such as delirium and C. difficile. CBC in the morning We will bolus with another liter of fluids as patient still tachycardic We will also give patient metoprolol and Cardizem Hypertension Continue home medications Atrial fibrillation with Coumadin use Continue rate control Continue Coumadin Pharmacy to consult for Coumadin dosing Check INR in the morning Heart failure with reduced EF Currently euvolemic Hold Lasix while fluid resuscitation [name] Continue furosemide   Advance  Care Planning:   Code Status: Prior patient confirms full code  Consults: None  Family Communication: None  Severity of Illness: The appropriate patient status for this patient is INPATIENT. Inpatient status is judged to be reasonable and necessary in order to provide the required intensity of service to ensure the patient's safety. The patient's presenting symptoms, physical exam findings, and initial radiographic and laboratory data in the context of their chronic comorbidities is felt to place them at high risk for further clinical deterioration. Furthermore, it is not anticipated that the patient will be medically stable for discharge from the hospital within 2 midnights of admission.   * I certify that at the point of admission it is my clinical judgment that the patient will require inpatient hospital care spanning beyond 2 midnights from the point of admission due to high intensity of service, high risk for further deterioration and high frequency of surveillance required.*  Author: Truett Mainland, DO 05/08/2022 6:16 PM  For on call review [email or web address].

## 2022-05-08 NOTE — Progress Notes (Signed)
ANTICOAGULATION CONSULT NOTE - Initial Consult  Pharmacy Consult for warfarin Indication: atrial fibrillation  Allergies  Allergen Reactions   Penicillins Hives and Rash    Has patient had a PCN reaction causing immediate rash, facial/tongue/throat swelling, SOB or lightheadedness with hypotension: Yes Has patient had a PCN reaction causing severe rash involving mucus membranes or skin necrosis: Yes Has patient had a PCN reaction that required hospitalization: No Has patient had a PCN reaction occurring within the last 10 years: No If all of the above answers are "NO", then may proceed with Cephalosporin use.    Hct [Hydrochlorothiazide] Other (See Comments)    hyper   Statins Other (See Comments)    Myalgia, tolerates low dosages of lovastatin     Patient Measurements: Height: 5\' 6"  (167.6 cm) Weight: 83.3 kg (183 lb 9 oz) IBW/kg (Calculated) : 63.8  Vital Signs: Temp: 98.3 F (36.8 C) (07/28 2013) Temp Source: Oral (07/28 1827) BP: 161/121 (07/28 2013) Pulse Rate: 116 (07/28 2013)  Labs: Recent Labs    05/08/22 1427 05/08/22 1445  HGB 12.4*  --   HCT 37.7*  --   PLT 246  --   APTT  --  35  LABPROT  --  27.1*  INR  --  2.5*  CREATININE 1.04  --     Estimated Creatinine Clearance: 50.7 mL/min (by C-G formula based on SCr of 1.04 mg/dL).   Medical History: Past Medical History:  Diagnosis Date   Anemia    Atrial fibrillation ([place])    Cataract    Cellulitis    In past   Complication of anesthesia    HARD TIME WAKING; CAUSES MY BP TO GO UP    Coronary artery disease    5 bypasses   DJD (degenerative joint disease)    Ejection fraction < 50%    Mildly reduced, 40% by echo   GERD (gastroesophageal reflux disease)    Hyperlipidemia    Hypertension    Persistent atrial fibrillation (HCC)    Prostate hypertrophy    on CT scan 09/2014   PUD (peptic ulcer disease)    RESOLVED    Skin cancer of eyelid    Resected    Medications:  Scheduled:   [START  ON 05/09/2022] diltiazem  120 mg Oral Daily   [START ON 05/09/2022] ezetimibe  10 mg Oral Daily   finasteride  5 mg Oral Daily   metoprolol tartrate  100 mg Oral BID   montelukast  10 mg Oral QHS   pravastatin  20 mg Oral QODAY    Assessment: [name] is an 70 you male on warfarin PTA for atrial fibrillation. He is being admitted for sepsis 2/2 UTI. Home warfarin regimen is 5mg  Tu/Th/Sa/Su and 2.5mg  MWF. INR today is therapeutic at 2.5 (goal INR 2-3). Patient has taken his warfarin dose for today (2.5mg ).  Goal of Therapy:  INR 2-3 Monitor platelets by anticoagulation protocol: Yes   Plan:  Plan to obtain PT/INR in the AM and further dose based on labs in the morning. Likely can continue his home regimen if INR remains therapeutic.  [name] 05/08/2022,8:44 PM

## 2022-05-08 NOTE — ED Triage Notes (Addendum)
Pt with fever off and on x 2 weeks.  100.9 at home today.  Pt c/o nausea. Denies emesis Abd pain last night Burning with urination x 8 days ago  Denies seeing anyone for these symptoms

## 2022-05-08 NOTE — Progress Notes (Signed)
   05/08/22 2013  Assess: MEWS Score  Temp 98.3 F (36.8 C)  BP (!) 161/121  MAP (mmHg) 134  Pulse Rate (!) 116  Resp 14  SpO2 93 %  O2 Device Room Air  Assess: MEWS Score  MEWS Temp 0  MEWS Systolic 0  MEWS Pulse 2  MEWS RR 0  MEWS LOC 0  MEWS Score 2  MEWS Score Color Yellow  Assess: if the MEWS score is Yellow or Red  Were vital signs taken at a resting state? Yes  Focused Assessment No change from prior assessment  Does the patient meet 2 or more of the SIRS criteria? Yes  Does the patient have a confirmed or suspected source of infection? Yes  Provider and Rapid Response Notified? No  MEWS guidelines implemented *See Row Information* Yes  Treat  MEWS Interventions Administered scheduled meds/treatments  Pain Scale 0-10  Pain Score 0  Take Vital Signs  Increase Vital Sign Frequency  Yellow: Q 2hr X 2 then Q 4hr X 2, if remains yellow, continue Q 4hrs  Escalate  MEWS: Escalate Yellow: discuss with charge nurse/RN and consider discussing with provider and RRT  Notify: Charge Nurse/RN  Name of Charge Nurse/RN Notified Patricia Pesa, RN  Date Charge Nurse/RN Notified 05/08/22  Time Charge Nurse/RN Notified 2030  Assess: SIRS CRITERIA  SIRS Temperature  0  SIRS Pulse 1  SIRS Respirations  0  SIRS WBC 1  SIRS Score Sum  2

## 2022-05-09 DIAGNOSIS — N390 Urinary tract infection, site not specified: Secondary | ICD-10-CM | POA: Diagnosis not present

## 2022-05-09 DIAGNOSIS — A419 Sepsis, unspecified organism: Secondary | ICD-10-CM | POA: Diagnosis not present

## 2022-05-09 LAB — BLOOD CULTURE ID PANEL (REFLEXED) - BCID2

## 2022-05-09 LAB — BASIC METABOLIC PANEL
Anion gap: 6 (ref 5–15)
BUN: 20 mg/dL (ref 8–23)
CO2: 25 mmol/L (ref 22–32)
Calcium: 8.1 mg/dL — ABNORMAL LOW (ref 8.9–10.3)
Chloride: 108 mmol/L (ref 98–111)
Creatinine, Ser: 1.08 mg/dL (ref 0.61–1.24)
GFR, Estimated: >60 mL/min (ref >60)
Glucose, Bld: 128 mg/dL — ABNORMAL HIGH (ref 70–99)
Potassium: 3.6 mmol/L (ref 3.5–5.1)
Sodium: 139 mmol/L (ref 135–145)

## 2022-05-09 LAB — CBC
HCT: 36 % — ABNORMAL LOW (ref 39.0–52.0)
Hemoglobin: 11.5 g/dL — ABNORMAL LOW (ref 13.0–17.0)
MCH: 33.3 pg (ref 26.0–34.0)
MCHC: 31.9 g/dL (ref 30.0–36.0)
MCV: 104.3 fL — ABNORMAL HIGH (ref 80.0–100.0)
Platelets: 203 10*3/uL (ref 150–400)
RBC: 3.45 MIL/uL — ABNORMAL LOW (ref 4.22–5.81)
RDW: 12.7 % (ref 11.5–15.5)
WBC: 17.1 10*3/uL — ABNORMAL HIGH (ref 4.0–10.5)
nRBC: 0 % (ref 0.0–0.2)

## 2022-05-09 LAB — GLUCOSE, CAPILLARY: Glucose-Capillary: 137 mg/dL — ABNORMAL HIGH (ref 70–99)

## 2022-05-09 LAB — PROTIME-INR
INR: 2.5 — ABNORMAL HIGH (ref 0.8–1.2)
Prothrombin Time: 26.4 seconds — ABNORMAL HIGH (ref 11.4–15.2)

## 2022-05-09 LAB — MRSA NEXT GEN BY PCR, NASAL: MRSA by PCR Next Gen: NOT DETECTED

## 2022-05-09 MED ORDER — LORAZEPAM 2 MG/ML IJ SOLN
1.0000 mg | Freq: Once | INTRAMUSCULAR | Status: AC
  Administered 2022-05-09: 1 mg via INTRAVENOUS
  Filled 2022-05-09: qty 1

## 2022-05-09 MED ORDER — CEFTRIAXONE SODIUM 2 G IJ SOLR
2.0000 g | INTRAMUSCULAR | Status: DC
Start: 2022-05-09 — End: 2022-05-11
  Administered 2022-05-09 – 2022-05-10 (×2): 2 g via INTRAVENOUS
  Filled 2022-05-09 (×2): qty 20

## 2022-05-09 MED ORDER — CHLORHEXIDINE GLUCONATE CLOTH 2 % EX PADS
6.0000 | MEDICATED_PAD | Freq: Every day | CUTANEOUS | Status: DC
  Administered 2022-05-09 – 2022-05-11 (×3): 6 via TOPICAL

## 2022-05-09 MED ORDER — IPRATROPIUM-ALBUTEROL 0.5-2.5 (3) MG/3ML IN SOLN
RESPIRATORY_TRACT | Status: AC
  Administered 2022-05-09: 3 mL
  Filled 2022-05-09: qty 3

## 2022-05-09 MED ORDER — WARFARIN SODIUM 2.5 MG PO TABS
2.5000 mg | ORAL_TABLET | ORAL | Status: DC
Start: 2022-05-11 — End: 2022-05-09

## 2022-05-09 MED ORDER — CEFTRIAXONE SODIUM 2 G IJ SOLR
2.0000 g | INTRAMUSCULAR | Status: DC
Start: 2022-05-09 — End: 2022-05-09

## 2022-05-09 MED ORDER — DILTIAZEM HCL 25 MG/5ML IV SOLN
15.0000 mg | Freq: Once | INTRAVENOUS | Status: AC
  Administered 2022-05-09: 15 mg via INTRAVENOUS

## 2022-05-09 MED ORDER — DILTIAZEM HCL 25 MG/5ML IV SOLN
INTRAVENOUS | Status: AC
  Filled 2022-05-09: qty 5

## 2022-05-09 MED ORDER — WARFARIN SODIUM 2.5 MG PO TABS: 2.5000 mg | ORAL_TABLET | ORAL | Status: DC

## 2022-05-09 MED ORDER — METOPROLOL TARTRATE 5 MG/5ML IV SOLN
5.0000 mg | Freq: Once | INTRAVENOUS | Status: AC
  Administered 2022-05-09: 5 mg via INTRAVENOUS
  Filled 2022-05-09: qty 5

## 2022-05-09 MED ORDER — ALBUTEROL SULFATE (2.5 MG/3ML) 0.083% IN NEBU
INHALATION_SOLUTION | RESPIRATORY_TRACT | Status: AC
  Administered 2022-05-09: 2.5 mg
  Filled 2022-05-09: qty 3

## 2022-05-09 MED ORDER — WARFARIN - PHARMACIST DOSING INPATIENT: Freq: Every day | Status: DC

## 2022-05-09 MED ORDER — IPRATROPIUM-ALBUTEROL 0.5-2.5 (3) MG/3ML IN SOLN: 3.0000 mL | Freq: Four times a day (QID) | RESPIRATORY_TRACT | Status: DC | PRN

## 2022-05-09 MED ORDER — WARFARIN SODIUM 5 MG PO TABS
5.0000 mg | ORAL_TABLET | ORAL | Status: AC
Start: 2022-05-09 — End: 2022-05-09
  Administered 2022-05-09: 5 mg via ORAL
  Filled 2022-05-09: qty 1

## 2022-05-09 NOTE — Progress Notes (Signed)
Notified Dr. Berneice Gandy gram negative rods in 2 of 4 bottles bacteria E-Coli.

## 2022-05-09 NOTE — Progress Notes (Signed)
ANTICOAGULATION CONSULT NOTE - Initial Up Consult   Pharmacy Consult for warfarin dosing  Indication: atrial fibrillation   Allergies  Allergen Reactions   Penicillins Hives and Rash    Has patient had a PCN reaction causing immediate rash, facial/tongue/throat swelling, SOB or lightheadedness with hypotension: Yes Has patient had a PCN reaction causing severe rash involving mucus membranes or skin necrosis: Yes Has patient had a PCN reaction that required hospitalization: No Has patient had a PCN reaction occurring within the last 10 years: No If all of the above answers are "NO", then may proceed with Cephalosporin use.    Hct [Hydrochlorothiazide] Other (See Comments)    hyper   Statins Other (See Comments)    Myalgia, tolerates low dosages of lovastatin       Patient Measurements: Last Weight  Most recent update: 05/09/2022  7:06 AM    Weight  84.3 kg (185 lb 13.6 oz)            Body mass index is 30 kg/m. [name]               Temp: 98.1 F (36.7 C) (07/29 0338) Temp Source: Oral (07/29 0338) BP: 116/59 (07/29 0700) Pulse Rate: 81 (07/29 0700)  Labs: Recent Labs    05/08/22 1427 05/08/22 1445 05/09/22 0422  HGB 12.4*  --  11.5*  HCT 37.7*  --  36.0*  PLT 246  --  203  APTT  --  35  --   LABPROT  --  27.1* 26.4*  INR  --  2.5* 2.5*  CREATININE 1.04  --  1.08    Estimated Creatinine Clearance: 49.1 mL/min (by C-G formula based on SCr of 1.08 mg/dL).     Medications:  Medications Prior to Admission  Medication Sig Dispense Refill Last Dose   acetaminophen (TYLENOL) 500 MG tablet Take 1,000 mg by mouth every 6 (six) hours as needed for moderate pain.   05/08/2022   azelastine (ASTELIN) 0.1 % nasal spray Place 1 spray into both nostrils 2 (two) times daily. Use in each nostril as directed 30 mL 12 05/07/2022   cetirizine-pseudoephedrine (ZYRTEC-D) 5-120 MG tablet Take once per day as need for congestion/runny nose 10 tablet 0 05/07/2022    Cholecalciferol (VITAMIN D3) 5000 units CAPS Take 5,000 Units by mouth once a week.    Past Week   diltiazem (CARDIZEM CD) 120 MG 24 hr capsule Take one capsule daily 90 capsule 3 05/08/2022   ezetimibe (ZETIA) 10 MG tablet Take 1 tablet (10 mg total) by mouth daily. 90 tablet 3 05/08/2022   finasteride (PROSCAR) 5 MG tablet Take one tablet each day 90 tablet 3 05/07/2022   furosemide (LASIX) 20 MG tablet Take 1 tablet (20 mg total) by mouth daily. 90 tablet 3 05/08/2022   lovastatin (MEVACOR) 20 MG tablet TAKE 1/2 TABLET EVERY OTHER DAY 23 tablet 3 05/07/2022   metoprolol tartrate (LOPRESSOR) 100 MG tablet Take 1 tablet (100 mg total) by mouth 2 (two) times daily. 180 tablet 3 05/08/2022 at 0700   montelukast (SINGULAIR) 10 MG tablet Take 1 tablet (10 mg total) by mouth at bedtime. 30 tablet 3 05/07/2022   Polyethyl Glycol-Propyl Glycol (SYSTANE OP) Place 1 drop into both eyes 3 (three) times daily as needed (dry eyes).    05/08/2022   warfarin (COUMADIN) 5 MG tablet TAKE 1 TABLET ON SAT, SUN, TUES, & THURSTAKE 1/2 TABLET ON MON, WED, & FRIDAY 70 tablet 3 05/08/2022 at 0700  fluticasone (FLONASE) 50 MCG/ACT nasal spray Place 1 spray into both nostrils daily as needed for allergies or rhinitis.      Scheduled:   Chlorhexidine Gluconate Cloth  6 each Topical Q0600   diltiazem  120 mg Oral Daily   diltiazem       ezetimibe  10 mg Oral Daily   finasteride  5 mg Oral Daily   metoprolol tartrate  100 mg Oral BID   montelukast  10 mg Oral QHS   pravastatin  20 mg Oral QODAY   [START ON 05/11/2022] warfarin  2.5 mg Oral Q M,W,F   warfarin  5 mg Oral Once per day on Sun Tue Thu Sat   Warfarin - Pharmacist Dosing Inpatient   Does not apply q1600   Infusions:   sodium chloride 125 mL/hr at 05/09/22 0403   cefTRIAXone (ROCEPHIN)  IV Stopped (05/08/22 2106)   PRN: acetaminophen **OR** acetaminophen, diltiazem, ipratropium-albuterol, ondansetron **OR** ondansetron (ZOFRAN) IV, sodium chloride Anti-infectives  (From admission, onward)    Start     Dose/Rate Route Frequency Ordered Stop   05/08/22 2045  cefTRIAXone (ROCEPHIN) 1 g in sodium chloride 0.9 % 100 mL IVPB        1 g 200 mL/hr over 30 Minutes Intravenous Every 24 hours 05/08/22 1949     05/08/22 1715  ciprofloxacin (CIPRO) IVPB 400 mg        400 mg 200 mL/hr over 60 Minutes Intravenous  Once 05/08/22 1711 05/08/22 1845       Goal of Therapy:  INR 2-3 Monitor platelets by anticoagulation protocol: Yes    Prior to Admission Warfarin Dosing:  [name] takes warfarin Home regimen:  5mg  Su, Tu, Th, Sa 2.5mg  MWF     Admit INR was 2.5 Lab Results  Component Value Date   INR 2.5 (H) 05/09/2022   INR 2.5 (H) 05/08/2022   INR 2.9 (H) 05/01/2022    Assessment: [name] a 88 y.o. male requires anticoagulation with warfarin for the indication of  atrial fibrillation. Warfarin will be initiated inpatient following pharmacy protocol per pharmacy consult. Patient most recent blood work is as follows:    Latest Ref Rng & Units 05/09/2022    4:22 AM 05/08/2022    2:27 PM 02/16/2022    9:47 AM  CBC  WBC 4.0 - 10.5 K/uL 17.1  16.9  12.2   Hemoglobin 13.0 - 17.0 g/dL 11.5  12.4  14.0   Hematocrit 39.0 - 52.0 % 36.0  37.7  41.4   Platelets 150 - 400 K/uL 203  246  182      Plan: Warfarin po 5mg  Su, Tu, Th, Sa and 2.5mg  MWF Monitor CBC MWF with am labs   Monitor INR daily Monitor for signs and symptoms of bleeding   Donna Christen Christiano Blandon, PharmD, MBA, BCGP Clinical Pharmacist

## 2022-05-09 NOTE — Progress Notes (Signed)
PROGRESS NOTE    [name]  ENI:[id number] DOB: 09-09-1932 DOA: 05/08/2022 PCP: [name], Fransisca Kaufmann, MD   Brief Narrative:    [name] is a 88 y.o. male with medical history significant of atrial fibrillation on Coumadin, hypertension, heart failure with reduced EF, [name].  Patient seen for intermittent dysuria for the past month with intermittent fevers.  Patient admitted with sepsis, present on admission secondary to UTI with E. coli bacteremia noted.  He has been started on IV Rocephin.  Assessment & Plan:   Principal Problem:   Sepsis secondary to UTI [facility]) Active Problems:   Essential hypertension   Long term current use of anticoagulant therapy   [name] with elevated PSA   CHF (congestive heart failure) (HCC)  Assessment and Plan:   Sepsis, present on admission, secondary to UTI with E. coli bacteremia Admit Urine culture cultures obtained and pending Continue IV Rocephin CBC in the morning Hypertension Continue home medications Atrial fibrillation with Coumadin use Continue rate control with metoprolol and Cardizem Continue Coumadin Pharmacy to consult for Coumadin dosing Check INR in the morning Heart failure with reduced EF Currently euvolemic Hold Lasix while fluid resuscitation [name] Continue furosemide Acute hypoxemic respiratory failure -Wean oxygen as tolerated -Chest x-ray with no acute findings    DVT prophylaxis: Warfarin Code Status: Full Family Communication: None at bedside Disposition Plan:  Status is: Inpatient Remains inpatient appropriate because: Need for IV medications  Consultants:  None  Procedures:  None  Antimicrobials:  Anti-infectives (From admission, onward)    Start     Dose/Rate Route Frequency Ordered Stop   05/08/22 2045  cefTRIAXone (ROCEPHIN) 1 g in sodium chloride 0.9 % 100 mL IVPB        1 g 200 mL/hr over 30 Minutes Intravenous Every 24 hours 05/08/22 1949     05/08/22 1715  ciprofloxacin (CIPRO) IVPB 400 mg         400 mg 200 mL/hr over 60 Minutes Intravenous  Once 05/08/22 1711 05/08/22 1845       Subjective: Patient seen and evaluated today with no new acute complaints or concerns. No acute concerns or events noted overnight.  Objective: Vitals:   05/09/22 0800 05/09/22 0900 05/09/22 0926 05/09/22 1000  BP:   (!) 119/56 (!) 122/49  Pulse:   (!) 106 84  Resp: (!) 21 (!) 22 (!) 23 (!) 24  Temp:      TempSrc:      SpO2: 98%   97%  Weight:      Height:        Intake/Output Summary (Last 24 hours) at 05/09/2022 1052 Last data filed at 05/09/2022 0403 Gross per 24 hour  Intake 759.7 ml  Output 740 ml  Net 19.7 ml   Filed Weights   05/08/22 1400 05/09/22 0700  Weight: 83.3 kg 84.3 kg    Examination:  General exam: Appears calm and comfortable  Respiratory system: Clear to auscultation. Respiratory effort normal.  4 L nasal cannula Cardiovascular system: S1 & S2 heard, RRR.  Gastrointestinal system: Abdomen is soft Central nervous system: Alert and awake Extremities: No edema Skin: No significant lesions noted Psychiatry: Flat affect.    Data Reviewed: I have personally reviewed following labs and imaging studies  CBC: Recent Labs  Lab 05/08/22 1427 05/09/22 0422  WBC 16.9* 17.1*  NEUTROABS 13.5*  --   HGB 12.4* 11.5*  HCT 37.7* 36.0*  MCV 101.3* 104.3*  PLT 246 614   Basic Metabolic Panel: Recent  Labs  Lab 05/08/22 1427 05/09/22 0422  NA 137 139  K 3.3* 3.6  CL 105 108  CO2 24 25  GLUCOSE 106* 128*  BUN 19 20  CREATININE 1.04 1.08  CALCIUM 8.6* 8.1*   GFR: Estimated Creatinine Clearance: 49.1 mL/min (by C-G formula based on SCr of 1.08 mg/dL). Liver Function Tests: Recent Labs  Lab 05/08/22 1427  AST 18  ALT 19  ALKPHOS 51  BILITOT 1.4*  PROT 6.7  ALBUMIN 3.4*   No results for input(s): "LIPASE", "AMYLASE" in the last 168 hours. No results for input(s): "AMMONIA" in the last 168 hours. Coagulation Profile: Recent Labs  Lab  05/08/22 1445 05/09/22 0422  INR 2.5* 2.5*   Cardiac Enzymes: No results for input(s): "CKTOTAL", "CKMB", "CKMBINDEX", "TROPONINI" in the last 168 hours. BNP (last 3 results) No results for input(s): "PROBNP" in the last 8760 hours. HbA1C: No results for input(s): "HGBA1C" in the last 72 hours. CBG: Recent Labs  Lab 05/09/22 0200  GLUCAP 137*   Lipid Profile: No results for input(s): "CHOL", "HDL", "LDLCALC", "TRIG", "CHOLHDL", "LDLDIRECT" in the last 72 hours. Thyroid Function Tests: No results for input(s): "TSH", "T4TOTAL", "FREET4", "T3FREE", "THYROIDAB" in the last 72 hours. Anemia Panel: No results for input(s): "VITAMINB12", "FOLATE", "FERRITIN", "TIBC", "IRON", "RETICCTPCT" in the last 72 hours. Sepsis Labs: Recent Labs  Lab 05/08/22 1524 05/08/22 1725  LATICACIDVEN 1.3 0.9    Recent Results (from the past 240 hour(s))  Blood Culture (routine x 2)     Status: None (Preliminary result)   Collection Time: 05/08/22  2:45 PM   Specimen: Left Antecubital; Blood  Result Value Ref Range Status   Specimen Description   Final    LEFT ANTECUBITAL BOTTLES DRAWN AEROBIC AND ANAEROBIC Performed at [facility], [address], [place], [place] [address]    Special Requests   Final    Blood Culture results may not be optimal due to an excessive volume of blood received in culture bottles Performed at [facility], [address], [place], [place] [address]    Culture  Setup Time   Final    GRAM NEGATIVE RODS IN BOTH AEROBIC AND ANAEROBIC BOTTLES Organism ID to follow CRITICAL RESULT CALLED TO, READ BACK BY AND VERIFIED WITH: L. HILTON, AT 1026 05/09/22 D. VANHOOK Performed at [facility], [place] [address] [place], [place] [address]    Culture GRAM NEGATIVE RODS  Final   Report Status PENDING  Incomplete  Blood Culture ID Panel (Reflexed)     Status: Abnormal   Collection Time: 05/08/22  2:45 PM  Result Value Ref Range Status   Enterococcus faecalis NOT DETECTED  NOT DETECTED Final   Enterococcus Faecium NOT DETECTED NOT DETECTED Final   Listeria monocytogenes NOT DETECTED NOT DETECTED Final   Staphylococcus species NOT DETECTED NOT DETECTED Final   Staphylococcus aureus (BCID) NOT DETECTED NOT DETECTED Final   Staphylococcus epidermidis NOT DETECTED NOT DETECTED Final   Staphylococcus lugdunensis NOT DETECTED NOT DETECTED Final   Streptococcus species NOT DETECTED NOT DETECTED Final   Streptococcus agalactiae NOT DETECTED NOT DETECTED Final   Streptococcus pneumoniae NOT DETECTED NOT DETECTED Final   Streptococcus pyogenes NOT DETECTED NOT DETECTED Final   A.calcoaceticus-baumannii NOT DETECTED NOT DETECTED Final   Bacteroides fragilis NOT DETECTED NOT DETECTED Final   Enterobacterales DETECTED (A) NOT DETECTED Final    Comment: Enterobacterales represent a large order of gram negative bacteria, not a single organism. CRITICAL RESULT CALLED TO, READ BACK BY  AND VERIFIED WITH: L. HILTON, AT 1026 05/09/22 D. VANHOOK    Enterobacter cloacae complex NOT DETECTED NOT DETECTED Final   Escherichia coli DETECTED (A) NOT DETECTED Final    Comment: CRITICAL RESULT CALLED TO, READ BACK BY AND VERIFIED WITH: L. HILTON, AT 1026 05/09/22 D. VANHOOK    Klebsiella aerogenes NOT DETECTED NOT DETECTED Final   Klebsiella oxytoca NOT DETECTED NOT DETECTED Final   Klebsiella pneumoniae NOT DETECTED NOT DETECTED Final   Proteus species NOT DETECTED NOT DETECTED Final   Salmonella species NOT DETECTED NOT DETECTED Final   Serratia marcescens NOT DETECTED NOT DETECTED Final   Haemophilus influenzae NOT DETECTED NOT DETECTED Final   Neisseria meningitidis NOT DETECTED NOT DETECTED Final   Pseudomonas aeruginosa NOT DETECTED NOT DETECTED Final   Stenotrophomonas maltophilia NOT DETECTED NOT DETECTED Final   Candida albicans NOT DETECTED NOT DETECTED Final   Candida auris NOT DETECTED NOT DETECTED Final   Candida glabrata NOT DETECTED NOT DETECTED Final   Candida  krusei NOT DETECTED NOT DETECTED Final   Candida parapsilosis NOT DETECTED NOT DETECTED Final   Candida tropicalis NOT DETECTED NOT DETECTED Final   Cryptococcus neoformans/gattii NOT DETECTED NOT DETECTED Final   CTX-M ESBL NOT DETECTED NOT DETECTED Final   Carbapenem resistance IMP NOT DETECTED NOT DETECTED Final   Carbapenem resistance KPC NOT DETECTED NOT DETECTED Final   Carbapenem resistance NDM NOT DETECTED NOT DETECTED Final   Carbapenem resist OXA 48 LIKE NOT DETECTED NOT DETECTED Final   Carbapenem resistance VIM NOT DETECTED NOT DETECTED Final    Comment: Performed at [facility], [address], [place], [place] [address]  Blood Culture (routine x 2)     Status: None (Preliminary result)   Collection Time: 05/08/22  3:24 PM   Specimen: BLOOD LEFT HAND  Result Value Ref Range Status   Specimen Description   Final    BLOOD LEFT HAND BOTTLES DRAWN AEROBIC AND ANAEROBIC   Special Requests   Final    Blood Culture results may not be optimal due to an excessive volume of blood received in culture bottles   Culture   Final    NO GROWTH < 12 HOURS Performed at [facility], [address] [place], [place] [address]    Report Status PENDING  Incomplete         Radiology Studies: DG Chest Port 1 View  Result Date: 05/08/2022 CLINICAL DATA:  Fever on off for 2 weeks EXAM: PORTABLE CHEST 1 VIEW COMPARISON:  01/15/2022 FINDINGS: Bilateral mild chronic interstitial thickening. No focal consolidation. No pleural effusion or pneumothorax. Heart and mediastinal contours are unremarkable. Prior CABG. Thoracic aortic atherosclerosis. No acute osseous abnormality. IMPRESSION: 1. No acute cardiopulmonary disease. Electronically Signed   By: Kathreen Devoid M.D.   On: 05/08/2022 14:59        Scheduled Meds:  Chlorhexidine Gluconate Cloth  6 each Topical Q0600   diltiazem  120 mg Oral Daily   diltiazem       ezetimibe  10 mg Oral Daily   finasteride  5 mg Oral Daily   metoprolol  tartrate  100 mg Oral BID   montelukast  10 mg Oral QHS   pravastatin  20 mg Oral QODAY   [START ON 05/11/2022] warfarin  2.5 mg Oral Q M,W,F   warfarin  5 mg Oral Once per day on Sun Tue Thu Sat   Warfarin - Pharmacist Dosing Inpatient   Does not apply q1600   Continuous  Infusions:  sodium chloride 125 mL/hr at 05/09/22 0403   cefTRIAXone (ROCEPHIN)  IV Stopped (05/08/22 2106)     LOS: 1 day    Time spent: 35 minutes    Greg Lofton Darleen Crocker, DO [facility]  If 7PM-7AM, please contact night-coverage [email or web address] 05/09/2022, 10:52 AM

## 2022-05-09 NOTE — Progress Notes (Signed)
Patient placed in ICU on 4 liters. Saturation 96 , f 28-32 with long forced exhalation. No wheezes very diminished. Given neb treatment with 5.0 albuterol / atrovent 0.5 . Still no wheezes after treatment but work of breathing appears improved. Patient states he feels some better. Will Make nebs PRN. Marland Kitchen

## 2022-05-10 DIAGNOSIS — N390 Urinary tract infection, site not specified: Secondary | ICD-10-CM | POA: Diagnosis not present

## 2022-05-10 DIAGNOSIS — A419 Sepsis, unspecified organism: Secondary | ICD-10-CM | POA: Diagnosis not present

## 2022-05-10 LAB — BASIC METABOLIC PANEL
Anion gap: 6 (ref 5–15)
BUN: 25 mg/dL — ABNORMAL HIGH (ref 8–23)
CO2: 24 mmol/L (ref 22–32)
Calcium: 8.4 mg/dL — ABNORMAL LOW (ref 8.9–10.3)
Chloride: 108 mmol/L (ref 98–111)
Creatinine, Ser: 0.91 mg/dL (ref 0.61–1.24)
GFR, Estimated: >60 mL/min (ref >60)
Glucose, Bld: 111 mg/dL — ABNORMAL HIGH (ref 70–99)
Potassium: 3.7 mmol/L (ref 3.5–5.1)
Sodium: 138 mmol/L (ref 135–145)

## 2022-05-10 LAB — CBC
HCT: 36.7 % — ABNORMAL LOW (ref 39.0–52.0)
Hemoglobin: 11.9 g/dL — ABNORMAL LOW (ref 13.0–17.0)
MCH: 33.7 pg (ref 26.0–34.0)
MCHC: 32.4 g/dL (ref 30.0–36.0)
MCV: 104 fL — ABNORMAL HIGH (ref 80.0–100.0)
Platelets: 204 10*3/uL (ref 150–400)
RBC: 3.53 MIL/uL — ABNORMAL LOW (ref 4.22–5.81)
RDW: 12.7 % (ref 11.5–15.5)
WBC: 13.8 10*3/uL — ABNORMAL HIGH (ref 4.0–10.5)
nRBC: 0 % (ref 0.0–0.2)

## 2022-05-10 LAB — PROTIME-INR
INR: 2.5 — ABNORMAL HIGH (ref 0.8–1.2)
Prothrombin Time: 27.1 seconds — ABNORMAL HIGH (ref 11.4–15.2)

## 2022-05-10 LAB — MAGNESIUM: Magnesium: 1.9 mg/dL (ref 1.7–2.4)

## 2022-05-10 MED ORDER — BISACODYL 10 MG RE SUPP
10.0000 mg | Freq: Once | RECTAL | Status: DC
  Filled 2022-05-10: qty 1

## 2022-05-10 MED ORDER — TRAZODONE HCL 50 MG PO TABS
50.0000 mg | ORAL_TABLET | Freq: Every day | ORAL | Status: DC
Start: 2022-05-10 — End: 2022-05-11
  Administered 2022-05-10: 50 mg via ORAL
  Filled 2022-05-10: qty 1

## 2022-05-10 NOTE — Progress Notes (Signed)
ANTICOAGULATION CONSULT NOTE - Follow Up Consult   Pharmacy Consult for warfarin dosing  Indication: atrial fibrillation   Allergies  Allergen Reactions   Penicillins Hives and Rash    Has patient had a PCN reaction causing immediate rash, facial/tongue/throat swelling, SOB or lightheadedness with hypotension: Yes Has patient had a PCN reaction causing severe rash involving mucus membranes or skin necrosis: Yes Has patient had a PCN reaction that required hospitalization: No Has patient had a PCN reaction occurring within the last 10 years: No If all of the above answers are "NO", then may proceed with Cephalosporin use.    Hct [Hydrochlorothiazide] Other (See Comments)    hyper   Statins Other (See Comments)    Myalgia, tolerates low dosages of lovastatin       Patient Measurements: Last Weight  Most recent update: 05/09/2022  7:06 AM    Weight  84.3 kg (185 lb 13.6 oz)            Body mass index is 30 kg/m. [name]               Temp: 99 F (37.2 C) (07/30 0732) Temp Source: Oral (07/30 0732) BP: 128/82 (07/30 0800) Pulse Rate: 113 (07/30 0800)  Labs: Recent Labs    05/08/22 1427 05/08/22 1445 05/09/22 0422 05/10/22 0518  HGB 12.4*  --  11.5* 11.9*  HCT 37.7*  --  36.0* 36.7*  PLT 246  --  203 204  APTT  --  35  --   --   LABPROT  --  27.1* 26.4* 27.1*  INR  --  2.5* 2.5* 2.5*  CREATININE 1.04  --  1.08 0.91     Estimated Creatinine Clearance: 58.2 mL/min (by C-G formula based on SCr of 0.91 mg/dL).     Medications:  Medications Prior to Admission  Medication Sig Dispense Refill Last Dose   acetaminophen (TYLENOL) 500 MG tablet Take 1,000 mg by mouth every 6 (six) hours as needed for moderate pain.   05/08/2022   azelastine (ASTELIN) 0.1 % nasal spray Place 1 spray into both nostrils 2 (two) times daily. Use in each nostril as directed 30 mL 12 05/07/2022   cetirizine-pseudoephedrine (ZYRTEC-D) 5-120 MG tablet Take once per day as need for  congestion/runny nose 10 tablet 0 05/07/2022   Cholecalciferol (VITAMIN D3) 5000 units CAPS Take 5,000 Units by mouth once a week.    Past Week   diltiazem (CARDIZEM CD) 120 MG 24 hr capsule Take one capsule daily 90 capsule 3 05/08/2022   ezetimibe (ZETIA) 10 MG tablet Take 1 tablet (10 mg total) by mouth daily. 90 tablet 3 05/08/2022   finasteride (PROSCAR) 5 MG tablet Take one tablet each day 90 tablet 3 05/07/2022   furosemide (LASIX) 20 MG tablet Take 1 tablet (20 mg total) by mouth daily. 90 tablet 3 05/08/2022   lovastatin (MEVACOR) 20 MG tablet TAKE 1/2 TABLET EVERY OTHER DAY 23 tablet 3 05/07/2022   metoprolol tartrate (LOPRESSOR) 100 MG tablet Take 1 tablet (100 mg total) by mouth 2 (two) times daily. 180 tablet 3 05/08/2022 at 0700   montelukast (SINGULAIR) 10 MG tablet Take 1 tablet (10 mg total) by mouth at bedtime. 30 tablet 3 05/07/2022   Polyethyl Glycol-Propyl Glycol (SYSTANE OP) Place 1 drop into both eyes 3 (three) times daily as needed (dry eyes).    05/08/2022   warfarin (COUMADIN) 5 MG tablet TAKE 1 TABLET ON SAT, SUN, TUES, & THURSTAKE 1/2 TABLET ON  MON, WED, & FRIDAY 70 tablet 3 05/08/2022 at 0700   fluticasone (FLONASE) 50 MCG/ACT nasal spray Place 1 spray into both nostrils daily as needed for allergies or rhinitis.      Scheduled:   Chlorhexidine Gluconate Cloth  6 each Topical Q0600   diltiazem  120 mg Oral Daily   ezetimibe  10 mg Oral Daily   finasteride  5 mg Oral Daily   metoprolol tartrate  100 mg Oral BID   montelukast  10 mg Oral QHS   pravastatin  20 mg Oral QODAY   [START ON 05/11/2022] warfarin  2.5 mg Oral Q M,W,F   Warfarin - Pharmacist Dosing Inpatient   Does not apply q1600   Infusions:   cefTRIAXone (ROCEPHIN)  IV 2 g (05/09/22 1306)   PRN: acetaminophen **OR** acetaminophen, ipratropium-albuterol, ondansetron **OR** ondansetron (ZOFRAN) IV, sodium chloride Anti-infectives (From admission, onward)    Start     Dose/Rate Route Frequency Ordered Stop    05/09/22 2045  cefTRIAXone (ROCEPHIN) 2 g in sodium chloride 0.9 % 100 mL IVPB  Status:  Discontinued        2 g 200 mL/hr over 30 Minutes Intravenous Every 24 hours 05/09/22 1129 05/09/22 1129   05/09/22 1230  cefTRIAXone (ROCEPHIN) 2 g in sodium chloride 0.9 % 100 mL IVPB        2 g 200 mL/hr over 30 Minutes Intravenous Every 24 hours 05/09/22 1130     05/09/22 1200  cefTRIAXone (ROCEPHIN) 2 g in sodium chloride 0.9 % 100 mL IVPB  Status:  Discontinued        2 g 200 mL/hr over 30 Minutes Intravenous Every 24 hours 05/09/22 1129 05/09/22 1130   05/08/22 2045  cefTRIAXone (ROCEPHIN) 1 g in sodium chloride 0.9 % 100 mL IVPB  Status:  Discontinued        1 g 200 mL/hr over 30 Minutes Intravenous Every 24 hours 05/08/22 1949 05/09/22 1129   05/08/22 1715  ciprofloxacin (CIPRO) IVPB 400 mg        400 mg 200 mL/hr over 60 Minutes Intravenous  Once 05/08/22 1711 05/08/22 1845       Goal of Therapy:  INR 2-3 Monitor platelets by anticoagulation protocol: Yes    Prior to Admission Warfarin Dosing:  [name] takes warfarin Home regimen:  5mg  Su, Tu, Th, Sa 2.5mg  MWF     Admit INR was 2.5 Lab Results  Component Value Date   INR 2.5 (H) 05/10/2022   INR 2.5 (H) 05/09/2022   INR 2.5 (H) 05/08/2022    Assessment: [name] a 88 y.o. male requires anticoagulation with warfarin for the indication of  atrial fibrillation. Warfarin will be initiated inpatient following pharmacy protocol per pharmacy consult. Patient most recent blood work is as follows:    Latest Ref Rng & Units 05/10/2022    5:18 AM 05/09/2022    4:22 AM 05/08/2022    2:27 PM  CBC  WBC 4.0 - 10.5 K/uL 13.8  17.1  16.9   Hemoglobin 13.0 - 17.0 g/dL 11.9  11.5  12.4   Hematocrit 39.0 - 52.0 % 36.7  36.0  37.7   Platelets 150 - 400 K/uL 204  203  246      Plan: Warfarin po 5mg  Su, Tu, Th, Sa and 2.5mg  MWF Monitor CBC MWF with am labs   Monitor INR daily Monitor for signs and symptoms of bleeding   [name]  [name], PharmD, MBA, BCGP Clinical Pharmacist

## 2022-05-10 NOTE — Progress Notes (Signed)
PROGRESS NOTE    [name]  CZY:[id number] DOB: 10/07/1932 DOA: 05/08/2022 PCP: [name], Fransisca Kaufmann, MD   Brief Narrative:    [name] is a 88 y.o. male with medical history significant of atrial fibrillation on Coumadin, hypertension, heart failure with reduced EF, [name].  Patient seen for intermittent dysuria for the past month with intermittent fevers.  Patient admitted with sepsis, present on admission secondary to UTI with E. coli bacteremia noted.  He has been started on IV Rocephin.  Assessment & Plan:   Principal Problem:   Sepsis secondary to UTI [facility]) Active Problems:   Essential hypertension   Long term current use of anticoagulant therapy   [name] with elevated PSA   CHF (congestive heart failure) (HCC)  Assessment and Plan:   Sepsis, present on admission, secondary to UTI with E. coli bacteremia Admit Urine culture cultures with E. coli noted and sensitivities pending Continue IV Rocephin CBC in the morning Hypertension Continue home medications Atrial fibrillation with Coumadin use Continue rate control with metoprolol and Cardizem, mildly elevated rates noted Continue Coumadin Pharmacy to consult for Coumadin dosing Check INR in the morning Heart failure with reduced EF Currently euvolemic Hold Lasix while fluid resuscitation [name] Continue furosemide Acute hypoxemic respiratory failure -Wean oxygen as tolerated -Chest x-ray with no acute findings     DVT prophylaxis: Warfarin Code Status: Full Family Communication: None at bedside Disposition Plan:  Status is: Inpatient Remains inpatient appropriate because: Need for IV medications   Consultants:  None   Procedures:  None   Antimicrobials:  Anti-infectives (From admission, onward)    Start     Dose/Rate Route Frequency Ordered Stop   05/09/22 2045  cefTRIAXone (ROCEPHIN) 2 g in sodium chloride 0.9 % 100 mL IVPB  Status:  Discontinued        2 g 200 mL/hr over 30 Minutes Intravenous  Every 24 hours 05/09/22 1129 05/09/22 1129   05/09/22 1230  cefTRIAXone (ROCEPHIN) 2 g in sodium chloride 0.9 % 100 mL IVPB        2 g 200 mL/hr over 30 Minutes Intravenous Every 24 hours 05/09/22 1130     05/09/22 1200  cefTRIAXone (ROCEPHIN) 2 g in sodium chloride 0.9 % 100 mL IVPB  Status:  Discontinued        2 g 200 mL/hr over 30 Minutes Intravenous Every 24 hours 05/09/22 1129 05/09/22 1130   05/08/22 2045  cefTRIAXone (ROCEPHIN) 1 g in sodium chloride 0.9 % 100 mL IVPB  Status:  Discontinued        1 g 200 mL/hr over 30 Minutes Intravenous Every 24 hours 05/08/22 1949 05/09/22 1129   05/08/22 1715  ciprofloxacin (CIPRO) IVPB 400 mg        400 mg 200 mL/hr over 60 Minutes Intravenous  Once 05/08/22 1711 05/08/22 1845       Subjective: Patient seen and evaluated today with no new acute complaints or concerns.  Noted to have fever up to 103 Fahrenheit overnight.  Objective: Vitals:   05/10/22 0732 05/10/22 0800 05/10/22 0900 05/10/22 1102  BP:  128/82 (!) 105/53   Pulse: 94 (!) 113 97 (!) 108  Resp: (!) 23 (!) 24 17 (!) 30  Temp: 99 F (37.2 C)   98.5 F (36.9 C)  TempSrc: Oral   Oral  SpO2: 96% 95% 96% 95%  Weight:      Height:        Intake/Output Summary (Last 24 hours) at 05/10/2022 1227  Last data filed at 05/10/2022 0536 Gross per 24 hour  Intake 1090.76 ml  Output 950 ml  Net 140.76 ml   Filed Weights   05/08/22 1400 05/09/22 0700  Weight: 83.3 kg 84.3 kg    Examination:  General exam: Appears calm and comfortable  Respiratory system: Clear to auscultation. Respiratory effort normal.  Nasal cannula Cardiovascular system: S1 & S2 heard, irregular and tachycardic Gastrointestinal system: Abdomen is soft Central nervous system: Alert and awake Extremities: No edema Skin: No significant lesions noted Psychiatry: Flat affect.    Data Reviewed: I have personally reviewed following labs and imaging studies  CBC: Recent Labs  Lab 05/08/22 1427  05/09/22 0422 05/10/22 0518  WBC 16.9* 17.1* 13.8*  NEUTROABS 13.5*  --   --   HGB 12.4* 11.5* 11.9*  HCT 37.7* 36.0* 36.7*  MCV 101.3* 104.3* 104.0*  PLT 246 203 341   Basic Metabolic Panel: Recent Labs  Lab 05/08/22 1427 05/09/22 0422 05/10/22 0518  NA 137 139 138  K 3.3* 3.6 3.7  CL 105 108 108  CO2 24 25 24   GLUCOSE 106* 128* 111*  BUN 19 20 25*  CREATININE 1.04 1.08 0.91  CALCIUM 8.6* 8.1* 8.4*  MG  --   --  1.9   GFR: Estimated Creatinine Clearance: 58.2 mL/min (by C-G formula based on SCr of 0.91 mg/dL). Liver Function Tests: Recent Labs  Lab 05/08/22 1427  AST 18  ALT 19  ALKPHOS 51  BILITOT 1.4*  PROT 6.7  ALBUMIN 3.4*   No results for input(s): "LIPASE", "AMYLASE" in the last 168 hours. No results for input(s): "AMMONIA" in the last 168 hours. Coagulation Profile: Recent Labs  Lab 05/08/22 1445 05/09/22 0422 05/10/22 0518  INR 2.5* 2.5* 2.5*   Cardiac Enzymes: No results for input(s): "CKTOTAL", "CKMB", "CKMBINDEX", "TROPONINI" in the last 168 hours. BNP (last 3 results) No results for input(s): "PROBNP" in the last 8760 hours. HbA1C: No results for input(s): "HGBA1C" in the last 72 hours. CBG: Recent Labs  Lab 05/09/22 0200  GLUCAP 137*   Lipid Profile: No results for input(s): "CHOL", "HDL", "LDLCALC", "TRIG", "CHOLHDL", "LDLDIRECT" in the last 72 hours. Thyroid Function Tests: No results for input(s): "TSH", "T4TOTAL", "FREET4", "T3FREE", "THYROIDAB" in the last 72 hours. Anemia Panel: No results for input(s): "VITAMINB12", "FOLATE", "FERRITIN", "TIBC", "IRON", "RETICCTPCT" in the last 72 hours. Sepsis Labs: Recent Labs  Lab 05/08/22 1524 05/08/22 1725  LATICACIDVEN 1.3 0.9    Recent Results (from the past 240 hour(s))  Blood Culture (routine x 2)     Status: Abnormal (Preliminary result)   Collection Time: 05/08/22  2:45 PM   Specimen: Left Antecubital; Blood  Result Value Ref Range Status   Specimen Description   Final     LEFT ANTECUBITAL BOTTLES DRAWN AEROBIC AND ANAEROBIC Performed at [facility], [address], [place], [place] [address]    Special Requests   Final    Blood Culture results may not be optimal due to an excessive volume of blood received in culture bottles Performed at [facility], [address], [place], [place] [address]    Culture  Setup Time   Final    GRAM NEGATIVE RODS IN BOTH AEROBIC AND ANAEROBIC BOTTLES CRITICAL RESULT CALLED TO, READ BACK BY AND VERIFIED WITH: L. HILTON, AT 1026 05/09/22 D. VANHOOK    Culture (A)  Final    ESCHERICHIA COLI SUSCEPTIBILITIES TO FOLLOW Performed at [facility], [place] [address], [place], [place] [address]  Report Status PENDING  Incomplete  Blood Culture ID Panel (Reflexed)     Status: Abnormal   Collection Time: 05/08/22  2:45 PM  Result Value Ref Range Status   Enterococcus faecalis NOT DETECTED NOT DETECTED Final   Enterococcus Faecium NOT DETECTED NOT DETECTED Final   Listeria monocytogenes NOT DETECTED NOT DETECTED Final   Staphylococcus species NOT DETECTED NOT DETECTED Final   Staphylococcus aureus (BCID) NOT DETECTED NOT DETECTED Final   Staphylococcus epidermidis NOT DETECTED NOT DETECTED Final   Staphylococcus lugdunensis NOT DETECTED NOT DETECTED Final   Streptococcus species NOT DETECTED NOT DETECTED Final   Streptococcus agalactiae NOT DETECTED NOT DETECTED Final   Streptococcus pneumoniae NOT DETECTED NOT DETECTED Final   Streptococcus pyogenes NOT DETECTED NOT DETECTED Final   A.calcoaceticus-baumannii NOT DETECTED NOT DETECTED Final   Bacteroides fragilis NOT DETECTED NOT DETECTED Final   Enterobacterales DETECTED (A) NOT DETECTED Final    Comment: Enterobacterales represent a large order of gram negative bacteria, not a single organism. CRITICAL RESULT CALLED TO, READ BACK BY AND VERIFIED WITH: L. HILTON, AT 1026 05/09/22 D. VANHOOK    Enterobacter cloacae complex NOT DETECTED NOT DETECTED Final   Escherichia  coli DETECTED (A) NOT DETECTED Final    Comment: CRITICAL RESULT CALLED TO, READ BACK BY AND VERIFIED WITH: L. HILTON, AT 1026 05/09/22 D. VANHOOK    Klebsiella aerogenes NOT DETECTED NOT DETECTED Final   Klebsiella oxytoca NOT DETECTED NOT DETECTED Final   Klebsiella pneumoniae NOT DETECTED NOT DETECTED Final   Proteus species NOT DETECTED NOT DETECTED Final   Salmonella species NOT DETECTED NOT DETECTED Final   Serratia marcescens NOT DETECTED NOT DETECTED Final   Haemophilus influenzae NOT DETECTED NOT DETECTED Final   Neisseria meningitidis NOT DETECTED NOT DETECTED Final   Pseudomonas aeruginosa NOT DETECTED NOT DETECTED Final   Stenotrophomonas maltophilia NOT DETECTED NOT DETECTED Final   Candida albicans NOT DETECTED NOT DETECTED Final   Candida auris NOT DETECTED NOT DETECTED Final   Candida glabrata NOT DETECTED NOT DETECTED Final   Candida krusei NOT DETECTED NOT DETECTED Final   Candida parapsilosis NOT DETECTED NOT DETECTED Final   Candida tropicalis NOT DETECTED NOT DETECTED Final   Cryptococcus neoformans/gattii NOT DETECTED NOT DETECTED Final   CTX-M ESBL NOT DETECTED NOT DETECTED Final   Carbapenem resistance IMP NOT DETECTED NOT DETECTED Final   Carbapenem resistance KPC NOT DETECTED NOT DETECTED Final   Carbapenem resistance NDM NOT DETECTED NOT DETECTED Final   Carbapenem resist OXA 48 LIKE NOT DETECTED NOT DETECTED Final   Carbapenem resistance VIM NOT DETECTED NOT DETECTED Final    Comment: Performed at [facility], [address], [place], [place] [address]  Blood Culture (routine x 2)     Status: None (Preliminary result)   Collection Time: 05/08/22  3:24 PM   Specimen: BLOOD LEFT HAND  Result Value Ref Range Status   Specimen Description   Final    BLOOD LEFT HAND BOTTLES DRAWN AEROBIC AND ANAEROBIC   Special Requests   Final    Blood Culture results may not be optimal due to an excessive volume of blood received in culture bottles   Culture   Final     NO GROWTH 2 DAYS Performed at [facility], [address] [place], [place] [address]    Report Status PENDING  Incomplete  Urine Culture     Status: Abnormal (Preliminary result)   Collection Time: 05/08/22  3:35 PM   Specimen: Urine,  Clean Catch  Result Value Ref Range Status   Specimen Description   Final    URINE, CLEAN CATCH Performed at [facility], [address], [place], [place] [address]    Special Requests   Final    NONE Performed at [facility], [address], [place], [place] [address]    Culture >=100,000 COLONIES/mL ESCHERICHIA COLI (A)  Final   Report Status PENDING  Incomplete  MRSA Next Gen by PCR, Nasal     Status: None   Collection Time: 05/09/22  1:45 AM   Specimen: Nasal Mucosa; Nasal Swab  Result Value Ref Range Status   MRSA by PCR Next Gen NOT DETECTED NOT DETECTED Final    Comment: (NOTE) The GeneXpert MRSA Assay (FDA approved for NASAL specimens only), is one component of a comprehensive MRSA colonization surveillance program. It is not intended to diagnose MRSA infection nor to guide or monitor treatment for MRSA infections. Test performance is not FDA approved in patients less than 11 years old. Performed at [facility], [place] [address] [place], [place] [address]          Radiology Studies: DG Chest Port 1 View  Result Date: 05/08/2022 CLINICAL DATA:  Fever on off for 2 weeks EXAM: PORTABLE CHEST 1 VIEW COMPARISON:  01/15/2022 FINDINGS: Bilateral mild chronic interstitial thickening. No focal consolidation. No pleural effusion or pneumothorax. Heart and mediastinal contours are unremarkable. Prior CABG. Thoracic aortic atherosclerosis. No acute osseous abnormality. IMPRESSION: 1. No acute cardiopulmonary disease. Electronically Signed   By: Kathreen Devoid M.D.   On: 05/08/2022 14:59        Scheduled Meds:  bisacodyl  10 mg Rectal Once   Chlorhexidine Gluconate Cloth  6 each Topical Q0600   diltiazem  120 mg Oral Daily    ezetimibe  10 mg Oral Daily   finasteride  5 mg Oral Daily   metoprolol tartrate  100 mg Oral BID   montelukast  10 mg Oral QHS   pravastatin  20 mg Oral QODAY   [START ON 05/11/2022] warfarin  2.5 mg Oral Q M,W,F   Warfarin - Pharmacist Dosing Inpatient   Does not apply q1600   Continuous Infusions:  cefTRIAXone (ROCEPHIN)  IV 2 g (05/10/22 1206)     LOS: 2 days    Time spent: 35 minutes    Chibuikem Thang Darleen Crocker, DO [facility]  If 7PM-7AM, please contact night-coverage [email or web address] 05/10/2022, 12:27 PM

## 2022-05-11 ENCOUNTER — Telehealth: Payer: Self-pay | Admitting: *Deleted

## 2022-05-11 DIAGNOSIS — A419 Sepsis, unspecified organism: Secondary | ICD-10-CM | POA: Diagnosis not present

## 2022-05-11 DIAGNOSIS — N390 Urinary tract infection, site not specified: Secondary | ICD-10-CM | POA: Diagnosis not present

## 2022-05-11 LAB — CBC
HCT: 38.6 % — ABNORMAL LOW (ref 39.0–52.0)
Hemoglobin: 12.5 g/dL — ABNORMAL LOW (ref 13.0–17.0)
MCH: 33.4 pg (ref 26.0–34.0)
MCHC: 32.4 g/dL (ref 30.0–36.0)
MCV: 103.2 fL — ABNORMAL HIGH (ref 80.0–100.0)
Platelets: 217 10*3/uL (ref 150–400)
RBC: 3.74 MIL/uL — ABNORMAL LOW (ref 4.22–5.81)
RDW: 12.7 % (ref 11.5–15.5)
WBC: 12.7 10*3/uL — ABNORMAL HIGH (ref 4.0–10.5)
nRBC: 0 % (ref 0.0–0.2)

## 2022-05-11 LAB — BASIC METABOLIC PANEL
Anion gap: 6 (ref 5–15)
BUN: 22 mg/dL (ref 8–23)
CO2: 29 mmol/L (ref 22–32)
Calcium: 8.8 mg/dL — ABNORMAL LOW (ref 8.9–10.3)
Chloride: 106 mmol/L (ref 98–111)
Creatinine, Ser: 0.94 mg/dL (ref 0.61–1.24)
GFR, Estimated: >60 mL/min (ref >60)
Glucose, Bld: 95 mg/dL (ref 70–99)
Potassium: 4 mmol/L (ref 3.5–5.1)
Sodium: 141 mmol/L (ref 135–145)

## 2022-05-11 LAB — URINE CULTURE: Culture: >=100000 — AB

## 2022-05-11 LAB — CULTURE, BLOOD (ROUTINE X 2)

## 2022-05-11 LAB — PROTIME-INR
INR: 2.3 — ABNORMAL HIGH (ref 0.8–1.2)
Prothrombin Time: 24.9 seconds — ABNORMAL HIGH (ref 11.4–15.2)

## 2022-05-11 LAB — MAGNESIUM: Magnesium: 2.2 mg/dL (ref 1.7–2.4)

## 2022-05-11 MED ORDER — WARFARIN SODIUM 5 MG PO TABS: 5.0000 mg | ORAL_TABLET | ORAL | Status: DC

## 2022-05-11 MED ORDER — CEFADROXIL 1 G PO TABS: 1.0000 g | ORAL_TABLET | Freq: Two times a day (BID) | ORAL | 0 refills | Status: AC

## 2022-05-11 NOTE — Discharge Summary (Signed)
Physician Discharge Summary  [name] ONG:[id number] DOB: 11/04/1931 DOA: 05/08/2022  PCP: [name], Fransisca Kaufmann, MD  Admit date: 05/08/2022  Discharge date: 05/11/2022  Admitted From:Home  Disposition:  Home  Recommendations for Outpatient Follow-up:  Follow up with PCP in 1-2 weeks Continue on Duricef 1 g twice daily as prescribed for 4 more days to complete course of treatment for E. coli UTI with bacteremia Continue on other home medications as prior  Home Health: None  Equipment/Devices: None none  Discharge Condition:Stable  CODE STATUS: Full  Diet recommendation: Heart Healthy  Brief/Interim Summary: [name] is a 88 y.o. male with medical history significant of atrial fibrillation on Coumadin, hypertension, heart failure with reduced EF, [name].  Patient seen for intermittent dysuria for the past month with intermittent fevers.  Patient admitted with sepsis, present on admission secondary to UTI with E. coli bacteremia noted.  He was noted to have pan sensitivity and was treated with IV Rocephin empirically for 3 days and will require 4 more days of treatment with Duricef as noted above to complete total 7-day course of treatment.  He has no further significant fevers and overall stable vital signs and is eager for discharge today.  No other acute events noted.  Discharge Diagnoses:  Principal Problem:   Sepsis secondary to UTI [facility]) Active Problems:   Essential hypertension   Long term current use of anticoagulant therapy   [name] with elevated PSA   CHF (congestive heart failure) ([place])  Principal discharge diagnosis: Sepsis, present on admission, secondary to E. coli UTI with associated bacteremia.  Discharge Instructions  Discharge Instructions     Diet - low sodium heart healthy   Complete by: As directed    Increase activity slowly   Complete by: As directed       Allergies as of 05/11/2022       Reactions   Penicillins Hives, Rash   Has patient had a  PCN reaction causing immediate rash, facial/tongue/throat swelling, SOB or lightheadedness with hypotension: Yes Has patient had a PCN reaction causing severe rash involving mucus membranes or skin necrosis: Yes Has patient had a PCN reaction that required hospitalization: No Has patient had a PCN reaction occurring within the last 10 years: No If all of the above answers are "NO", then may proceed with Cephalosporin use.   Hct [hydrochlorothiazide] Other (See Comments)   hyper   Statins Other (See Comments)   Myalgia, tolerates low dosages of lovastatin         Medication List     TAKE these medications    acetaminophen 500 MG tablet Commonly known as: TYLENOL Take 1,000 mg by mouth every 6 (six) hours as needed for moderate pain.   azelastine 0.1 % nasal spray Commonly known as: ASTELIN Place 1 spray into both nostrils 2 (two) times daily. Use in each nostril as directed   cefadroxil 1 g tablet Commonly known as: DURICEF Take 1 tablet (1 g total) by mouth 2 (two) times daily for 4 days.   cetirizine-pseudoephedrine 5-120 MG tablet Commonly known as: ZYRTEC-D Take once per day as need for congestion/runny nose   diltiazem 120 MG 24 hr capsule Commonly known as: CARDIZEM CD Take one capsule daily   ezetimibe 10 MG tablet Commonly known as: ZETIA Take 1 tablet (10 mg total) by mouth daily.   finasteride 5 MG tablet Commonly known as: PROSCAR Take one tablet each day   fluticasone 50 MCG/ACT nasal spray Commonly known as: FLONASE  Place 1 spray into both nostrils daily as needed for allergies or rhinitis.   furosemide 20 MG tablet Commonly known as: LASIX Take 1 tablet (20 mg total) by mouth daily.   lovastatin 20 MG tablet Commonly known as: MEVACOR TAKE 1/2 TABLET EVERY OTHER DAY   metoprolol tartrate 100 MG tablet Commonly known as: LOPRESSOR Take 1 tablet (100 mg total) by mouth 2 (two) times daily.   montelukast 10 MG tablet Commonly known as:  SINGULAIR Take 1 tablet (10 mg total) by mouth at bedtime.   SYSTANE OP Place 1 drop into both eyes 3 (three) times daily as needed (dry eyes).   Vitamin D3 125 MCG (5000 UT) Caps Take 5,000 Units by mouth once a week.   warfarin 5 MG tablet Commonly known as: COUMADIN Take as directed. If you are unsure how to take this medication, talk to your nurse or doctor. Original instructions: TAKE 1 TABLET ON SAT, SUN, TUES, & THURSTAKE 1/2 TABLET ON MON, WED, & FRIDAY        Follow-up Information     [name], Fransisca Kaufmann, MD. Schedule an appointment as soon as possible for a visit in 1 week(s).   Specialties: Family Medicine, Cardiology Contact information: [place] [address] [phone]                Allergies  Allergen Reactions   Penicillins Hives and Rash    Has patient had a PCN reaction causing immediate rash, facial/tongue/throat swelling, SOB or lightheadedness with hypotension: Yes Has patient had a PCN reaction causing severe rash involving mucus membranes or skin necrosis: Yes Has patient had a PCN reaction that required hospitalization: No Has patient had a PCN reaction occurring within the last 10 years: No If all of the above answers are "NO", then may proceed with Cephalosporin use.    Hct [Hydrochlorothiazide] Other (See Comments)    hyper   Statins Other (See Comments)    Myalgia, tolerates low dosages of lovastatin     Consultations: None   Procedures/Studies: DG Chest Port 1 View  Result Date: 05/08/2022 CLINICAL DATA:  Fever on off for 2 weeks EXAM: PORTABLE CHEST 1 VIEW COMPARISON:  01/15/2022 FINDINGS: Bilateral mild chronic interstitial thickening. No focal consolidation. No pleural effusion or pneumothorax. Heart and mediastinal contours are unremarkable. Prior CABG. Thoracic aortic atherosclerosis. No acute osseous abnormality. IMPRESSION: 1. No acute cardiopulmonary disease. Electronically Signed   By: Kathreen Devoid M.D.   On:  05/08/2022 14:59     Discharge Exam: Vitals:   05/11/22 0856 05/11/22 0857  BP: 109/82 109/82  Pulse:  96  Resp:    Temp:    SpO2:     Vitals:   05/10/22 2307 05/11/22 0555 05/11/22 0856 05/11/22 0857  BP:  (!) 137/94 109/82 109/82  Pulse:  92  96  Resp:  19    Temp: 98.8 F (37.1 C) 98.2 F (36.8 C)    TempSrc: Oral Oral    SpO2:  96%    Weight:      Height:        General: Pt is alert, awake, not in acute distress Cardiovascular: RRR, S1/S2 +, no rubs, no gallops Respiratory: CTA bilaterally, no wheezing, no rhonchi Abdominal: Soft, NT, ND, bowel sounds + Extremities: no edema, no cyanosis    The results of significant diagnostics from this hospitalization (including imaging, microbiology, ancillary and laboratory) are listed below for reference.     Microbiology: Recent Results (from the  past 240 hour(s))  Blood Culture (routine x 2)     Status: Abnormal   Collection Time: 05/08/22  2:45 PM   Specimen: BLOOD  Result Value Ref Range Status   Specimen Description BLOOD LEFT ANTECUBITAL  Final   Special Requests   Final    BOTTLES DRAWN AEROBIC AND ANAEROBIC Blood Culture results may not be optimal due to an excessive volume of blood received in culture bottles   Culture  Setup Time   Final    GRAM NEGATIVE RODS IN BOTH AEROBIC AND ANAEROBIC BOTTLES CRITICAL RESULT CALLED TO, READ BACK BY AND VERIFIED WITH: L. HILTON, AT 1026 05/09/22 D. VANHOOK Performed at [facility], [place] [address] [place], [place] [address]    Culture ESCHERICHIA COLI (A)  Final   Report Status 05/11/2022 FINAL  Final   Organism ID, Bacteria ESCHERICHIA COLI  Final      Susceptibility   Escherichia coli - MIC*    AMPICILLIN 4 SENSITIVE Sensitive     CEFAZOLIN <=4 SENSITIVE Sensitive     CEFEPIME <=0.12 SENSITIVE Sensitive     CEFTAZIDIME <=1 SENSITIVE Sensitive     CEFTRIAXONE <=0.25 SENSITIVE Sensitive     CIPROFLOXACIN <=0.25 SENSITIVE Sensitive     GENTAMICIN <=1 SENSITIVE  Sensitive     IMIPENEM <=0.25 SENSITIVE Sensitive     TRIMETH/SULFA <=20 SENSITIVE Sensitive     AMPICILLIN/SULBACTAM <=2 SENSITIVE Sensitive     PIP/TAZO <=4 SENSITIVE Sensitive     * ESCHERICHIA COLI  Blood Culture ID Panel (Reflexed)     Status: Abnormal   Collection Time: 05/08/22  2:45 PM  Result Value Ref Range Status   Enterococcus faecalis NOT DETECTED NOT DETECTED Final   Enterococcus Faecium NOT DETECTED NOT DETECTED Final   Listeria monocytogenes NOT DETECTED NOT DETECTED Final   Staphylococcus species NOT DETECTED NOT DETECTED Final   Staphylococcus aureus (BCID) NOT DETECTED NOT DETECTED Final   Staphylococcus epidermidis NOT DETECTED NOT DETECTED Final   Staphylococcus lugdunensis NOT DETECTED NOT DETECTED Final   Streptococcus species NOT DETECTED NOT DETECTED Final   Streptococcus agalactiae NOT DETECTED NOT DETECTED Final   Streptococcus pneumoniae NOT DETECTED NOT DETECTED Final   Streptococcus pyogenes NOT DETECTED NOT DETECTED Final   A.calcoaceticus-baumannii NOT DETECTED NOT DETECTED Final   Bacteroides fragilis NOT DETECTED NOT DETECTED Final   Enterobacterales DETECTED (A) NOT DETECTED Final    Comment: Enterobacterales represent a large order of gram negative bacteria, not a single organism. CRITICAL RESULT CALLED TO, READ BACK BY AND VERIFIED WITH: L. HILTON, AT 1026 05/09/22 D. VANHOOK    Enterobacter cloacae complex NOT DETECTED NOT DETECTED Final   Escherichia coli DETECTED (A) NOT DETECTED Final    Comment: CRITICAL RESULT CALLED TO, READ BACK BY AND VERIFIED WITH: L. HILTON, AT 1026 05/09/22 D. VANHOOK    Klebsiella aerogenes NOT DETECTED NOT DETECTED Final   Klebsiella oxytoca NOT DETECTED NOT DETECTED Final   Klebsiella pneumoniae NOT DETECTED NOT DETECTED Final   Proteus species NOT DETECTED NOT DETECTED Final   Salmonella species NOT DETECTED NOT DETECTED Final   Serratia marcescens NOT DETECTED NOT DETECTED Final   Haemophilus influenzae NOT  DETECTED NOT DETECTED Final   Neisseria meningitidis NOT DETECTED NOT DETECTED Final   Pseudomonas aeruginosa NOT DETECTED NOT DETECTED Final   Stenotrophomonas maltophilia NOT DETECTED NOT DETECTED Final   Candida albicans NOT DETECTED NOT DETECTED Final   Candida auris NOT DETECTED NOT DETECTED Final   Candida glabrata NOT  DETECTED NOT DETECTED Final   Candida krusei NOT DETECTED NOT DETECTED Final   Candida parapsilosis NOT DETECTED NOT DETECTED Final   Candida tropicalis NOT DETECTED NOT DETECTED Final   Cryptococcus neoformans/gattii NOT DETECTED NOT DETECTED Final   CTX-M ESBL NOT DETECTED NOT DETECTED Final   Carbapenem resistance IMP NOT DETECTED NOT DETECTED Final   Carbapenem resistance KPC NOT DETECTED NOT DETECTED Final   Carbapenem resistance NDM NOT DETECTED NOT DETECTED Final   Carbapenem resist OXA 48 LIKE NOT DETECTED NOT DETECTED Final   Carbapenem resistance VIM NOT DETECTED NOT DETECTED Final    Comment: Performed at [facility], [place] [address] [place], [place] [address]  Blood Culture (routine x 2)     Status: None (Preliminary result)   Collection Time: 05/08/22  3:24 PM   Specimen: BLOOD LEFT HAND  Result Value Ref Range Status   Specimen Description   Final    BLOOD LEFT HAND BOTTLES DRAWN AEROBIC AND ANAEROBIC   Special Requests   Final    Blood Culture results may not be optimal due to an excessive volume of blood received in culture bottles   Culture   Final    NO GROWTH 3 DAYS Performed at [facility], [address] [place], [place] [address]    Report Status PENDING  Incomplete  Urine Culture     Status: Abnormal   Collection Time: 05/08/22  3:35 PM   Specimen: Urine, Clean Catch  Result Value Ref Range Status   Specimen Description   Final    URINE, CLEAN CATCH Performed at [facility], [address] [place], [place] [address]    Special Requests   Final    NONE Performed at [facility], [address] [place], [place] [address]     Culture >=100,000 COLONIES/mL ESCHERICHIA COLI (A)  Final   Report Status 05/11/2022 FINAL  Final   Organism ID, Bacteria ESCHERICHIA COLI (A)  Final      Susceptibility   Escherichia coli - MIC*    AMPICILLIN 4 SENSITIVE Sensitive     CEFAZOLIN <=4 SENSITIVE Sensitive     CEFEPIME <=0.12 SENSITIVE Sensitive     CEFTRIAXONE <=0.25 SENSITIVE Sensitive     CIPROFLOXACIN <=0.25 SENSITIVE Sensitive     GENTAMICIN <=1 SENSITIVE Sensitive     IMIPENEM <=0.25 SENSITIVE Sensitive     NITROFURANTOIN <=16 SENSITIVE Sensitive     TRIMETH/SULFA <=20 SENSITIVE Sensitive     AMPICILLIN/SULBACTAM <=2 SENSITIVE Sensitive     PIP/TAZO <=4 SENSITIVE Sensitive     * >=100,000 COLONIES/mL ESCHERICHIA COLI  MRSA Next Gen by PCR, Nasal     Status: None   Collection Time: 05/09/22  1:45 AM   Specimen: Nasal Mucosa; Nasal Swab  Result Value Ref Range Status   MRSA by PCR Next Gen NOT DETECTED NOT DETECTED Final    Comment: (NOTE) The GeneXpert MRSA Assay (FDA approved for NASAL specimens only), is one component of a comprehensive MRSA colonization surveillance program. It is not intended to diagnose MRSA infection nor to guide or monitor treatment for MRSA infections. Test performance is not FDA approved in patients less than 69 years old. Performed at [facility], [place] [address], [place], [place] [address]      Labs: BNP (last 3 results) No results for input(s): "BNP" in the last 8760 hours. Basic Metabolic Panel: Recent Labs  Lab 05/08/22 1427 05/09/22 0422 05/10/22 0518 05/11/22 0502  NA 137 139 138 141  K 3.3* 3.6 3.7 4.0  CL 105 108 108 106  CO2 24 25 24 29   GLUCOSE 106* 128* 111* 95  BUN 19 20 25* 22  CREATININE 1.04 1.08 0.91 0.94  CALCIUM 8.6* 8.1* 8.4* 8.8*  MG  --   --  1.9 2.2   Liver Function Tests: Recent Labs  Lab 05/08/22 1427  AST 18  ALT 19  ALKPHOS 51  BILITOT 1.4*  PROT 6.7  ALBUMIN 3.4*   No results for input(s): "LIPASE", "AMYLASE" in the last 168  hours. No results for input(s): "AMMONIA" in the last 168 hours. CBC: Recent Labs  Lab 05/08/22 1427 05/09/22 0422 05/10/22 0518 05/11/22 0502  WBC 16.9* 17.1* 13.8* 12.7*  NEUTROABS 13.5*  --   --   --   HGB 12.4* 11.5* 11.9* 12.5*  HCT 37.7* 36.0* 36.7* 38.6*  MCV 101.3* 104.3* 104.0* 103.2*  PLT 246 203 204 217   Cardiac Enzymes: No results for input(s): "CKTOTAL", "CKMB", "CKMBINDEX", "TROPONINI" in the last 168 hours. BNP: Invalid input(s): "POCBNP" CBG: Recent Labs  Lab 05/09/22 0200  GLUCAP 137*   D-Dimer No results for input(s): "DDIMER" in the last 72 hours. Hgb A1c No results for input(s): "HGBA1C" in the last 72 hours. Lipid Profile No results for input(s): "CHOL", "HDL", "LDLCALC", "TRIG", "CHOLHDL", "LDLDIRECT" in the last 72 hours. Thyroid function studies No results for input(s): "TSH", "T4TOTAL", "T3FREE", "THYROIDAB" in the last 72 hours.  Invalid input(s): "FREET3" Anemia work up No results for input(s): "VITAMINB12", "FOLATE", "FERRITIN", "TIBC", "IRON", "RETICCTPCT" in the last 72 hours. Urinalysis    Component Value Date/Time   COLORURINE YELLOW 05/08/2022 1535   APPEARANCEUR HAZY (A) 05/08/2022 1535   LABSPEC 1.013 05/08/2022 1535   PHURINE 6.0 05/08/2022 1535   GLUCOSEU NEGATIVE 05/08/2022 1535   HGBUR LARGE (A) 05/08/2022 1535   BILIRUBINUR NEGATIVE 05/08/2022 1535   BILIRUBINUR neg 02/14/2015 1247   KETONESUR 5 (A) 05/08/2022 1535   PROTEINUR 30 (A) 05/08/2022 1535   UROBILINOGEN negative 02/14/2015 1247   UROBILINOGEN 0.2 01/21/2015 0850   NITRITE POSITIVE (A) 05/08/2022 1535   LEUKOCYTESUR LARGE (A) 05/08/2022 1535   Sepsis Labs Recent Labs  Lab 05/08/22 1427 05/09/22 0422 05/10/22 0518 05/11/22 0502  WBC 16.9* 17.1* 13.8* 12.7*   Microbiology Recent Results (from the past 240 hour(s))  Blood Culture (routine x 2)     Status: Abnormal   Collection Time: 05/08/22  2:45 PM   Specimen: BLOOD  Result Value Ref Range Status    Specimen Description BLOOD LEFT ANTECUBITAL  Final   Special Requests   Final    BOTTLES DRAWN AEROBIC AND ANAEROBIC Blood Culture results may not be optimal due to an excessive volume of blood received in culture bottles   Culture  Setup Time   Final    GRAM NEGATIVE RODS IN BOTH AEROBIC AND ANAEROBIC BOTTLES CRITICAL RESULT CALLED TO, READ BACK BY AND VERIFIED WITH: L. HILTON, AT 1026 05/09/22 D. VANHOOK Performed at [facility], [place] [address], [place], [place] [address]    Culture ESCHERICHIA COLI (A)  Final   Report Status 05/11/2022 FINAL  Final   Organism ID, Bacteria ESCHERICHIA COLI  Final      Susceptibility   Escherichia coli - MIC*    AMPICILLIN 4 SENSITIVE Sensitive     CEFAZOLIN <=4 SENSITIVE Sensitive     CEFEPIME <=0.12 SENSITIVE Sensitive     CEFTAZIDIME <=1 SENSITIVE Sensitive     CEFTRIAXONE <=0.25 SENSITIVE Sensitive  CIPROFLOXACIN <=0.25 SENSITIVE Sensitive     GENTAMICIN <=1 SENSITIVE Sensitive     IMIPENEM <=0.25 SENSITIVE Sensitive     TRIMETH/SULFA <=20 SENSITIVE Sensitive     AMPICILLIN/SULBACTAM <=2 SENSITIVE Sensitive     PIP/TAZO <=4 SENSITIVE Sensitive     * ESCHERICHIA COLI  Blood Culture ID Panel (Reflexed)     Status: Abnormal   Collection Time: 05/08/22  2:45 PM  Result Value Ref Range Status   Enterococcus faecalis NOT DETECTED NOT DETECTED Final   Enterococcus Faecium NOT DETECTED NOT DETECTED Final   Listeria monocytogenes NOT DETECTED NOT DETECTED Final   Staphylococcus species NOT DETECTED NOT DETECTED Final   Staphylococcus aureus (BCID) NOT DETECTED NOT DETECTED Final   Staphylococcus epidermidis NOT DETECTED NOT DETECTED Final   Staphylococcus lugdunensis NOT DETECTED NOT DETECTED Final   Streptococcus species NOT DETECTED NOT DETECTED Final   Streptococcus agalactiae NOT DETECTED NOT DETECTED Final   Streptococcus pneumoniae NOT DETECTED NOT DETECTED Final   Streptococcus pyogenes NOT DETECTED NOT DETECTED Final    A.calcoaceticus-baumannii NOT DETECTED NOT DETECTED Final   Bacteroides fragilis NOT DETECTED NOT DETECTED Final   Enterobacterales DETECTED (A) NOT DETECTED Final    Comment: Enterobacterales represent a large order of gram negative bacteria, not a single organism. CRITICAL RESULT CALLED TO, READ BACK BY AND VERIFIED WITH: L. HILTON, AT 1026 05/09/22 D. VANHOOK    Enterobacter cloacae complex NOT DETECTED NOT DETECTED Final   Escherichia coli DETECTED (A) NOT DETECTED Final    Comment: CRITICAL RESULT CALLED TO, READ BACK BY AND VERIFIED WITH: L. HILTON, AT 1026 05/09/22 D. VANHOOK    Klebsiella aerogenes NOT DETECTED NOT DETECTED Final   Klebsiella oxytoca NOT DETECTED NOT DETECTED Final   Klebsiella pneumoniae NOT DETECTED NOT DETECTED Final   Proteus species NOT DETECTED NOT DETECTED Final   Salmonella species NOT DETECTED NOT DETECTED Final   Serratia marcescens NOT DETECTED NOT DETECTED Final   Haemophilus influenzae NOT DETECTED NOT DETECTED Final   Neisseria meningitidis NOT DETECTED NOT DETECTED Final   Pseudomonas aeruginosa NOT DETECTED NOT DETECTED Final   Stenotrophomonas maltophilia NOT DETECTED NOT DETECTED Final   Candida albicans NOT DETECTED NOT DETECTED Final   Candida auris NOT DETECTED NOT DETECTED Final   Candida glabrata NOT DETECTED NOT DETECTED Final   Candida krusei NOT DETECTED NOT DETECTED Final   Candida parapsilosis NOT DETECTED NOT DETECTED Final   Candida tropicalis NOT DETECTED NOT DETECTED Final   Cryptococcus neoformans/gattii NOT DETECTED NOT DETECTED Final   CTX-M ESBL NOT DETECTED NOT DETECTED Final   Carbapenem resistance IMP NOT DETECTED NOT DETECTED Final   Carbapenem resistance KPC NOT DETECTED NOT DETECTED Final   Carbapenem resistance NDM NOT DETECTED NOT DETECTED Final   Carbapenem resist OXA 48 LIKE NOT DETECTED NOT DETECTED Final   Carbapenem resistance VIM NOT DETECTED NOT DETECTED Final    Comment: Performed at [facility],  [address], [place], [place] [address]  Blood Culture (routine x 2)     Status: None (Preliminary result)   Collection Time: 05/08/22  3:24 PM   Specimen: BLOOD LEFT HAND  Result Value Ref Range Status   Specimen Description   Final    BLOOD LEFT HAND BOTTLES DRAWN AEROBIC AND ANAEROBIC   Special Requests   Final    Blood Culture results may not be optimal due to an excessive volume of blood received in culture bottles   Culture   Final  NO GROWTH 3 DAYS Performed at [facility], [address] [place], [place] [address]    Report Status PENDING  Incomplete  Urine Culture     Status: Abnormal   Collection Time: 05/08/22  3:35 PM   Specimen: Urine, Clean Catch  Result Value Ref Range Status   Specimen Description   Final    URINE, CLEAN CATCH Performed at [facility], [address] [place], [place] [address]    Special Requests   Final    NONE Performed at [facility], [address], [place], [place] [address]    Culture >=100,000 COLONIES/mL ESCHERICHIA COLI (A)  Final   Report Status 05/11/2022 FINAL  Final   Organism ID, Bacteria ESCHERICHIA COLI (A)  Final      Susceptibility   Escherichia coli - MIC*    AMPICILLIN 4 SENSITIVE Sensitive     CEFAZOLIN <=4 SENSITIVE Sensitive     CEFEPIME <=0.12 SENSITIVE Sensitive     CEFTRIAXONE <=0.25 SENSITIVE Sensitive     CIPROFLOXACIN <=0.25 SENSITIVE Sensitive     GENTAMICIN <=1 SENSITIVE Sensitive     IMIPENEM <=0.25 SENSITIVE Sensitive     NITROFURANTOIN <=16 SENSITIVE Sensitive     TRIMETH/SULFA <=20 SENSITIVE Sensitive     AMPICILLIN/SULBACTAM <=2 SENSITIVE Sensitive     PIP/TAZO <=4 SENSITIVE Sensitive     * >=100,000 COLONIES/mL ESCHERICHIA COLI  MRSA Next Gen by PCR, Nasal     Status: None   Collection Time: 05/09/22  1:45 AM   Specimen: Nasal Mucosa; Nasal Swab  Result Value Ref Range Status   MRSA by PCR Next Gen NOT DETECTED NOT DETECTED Final    Comment: (NOTE) The GeneXpert MRSA Assay (FDA approved for NASAL  specimens only), is one component of a comprehensive MRSA colonization surveillance program. It is not intended to diagnose MRSA infection nor to guide or monitor treatment for MRSA infections. Test performance is not FDA approved in patients less than 56 years old. Performed at [facility], [place] [address], [place],  [address]      Time coordinating discharge: 35 minutes  SIGNED:   Rodena Goldmann, DO [facility] 05/11/2022, 9:59 AM  If 7PM-7AM, please contact night-coverage [email or web address]

## 2022-05-11 NOTE — Progress Notes (Signed)
Patient was given tylenol 650mg  PO at 2145 for a temp of 100.4. Patient was warm to touch. When followed up on patients temp at 2307 it was 98.8. Patient slept the rest of the night only waking when vitals needed to be taken. No complaints of pain.

## 2022-05-11 NOTE — Telephone Encounter (Signed)
Contact Date: 05/11/2022 Contacted By: Georgina Pillion, LPN   Transition Care Management Follow-up Telephone Call Date of discharge and from where: 05/11/22- [facility]  Discharge Diagnosis:sepsis from uti How have you been since you were released from the hospital? Weak, trying to gain strength back  Any questions or concerns? No   Items Reviewed: Did the pt receive and understand the discharge instructions provided? Yes  Medications obtained and verified? Yes  Any new allergies since your discharge? No  Dietary orders reviewed? Yes Do you have support at home? Yes   Discontinued Medications none New Medications Added none  Current Medication List Allergies as of 05/11/2022       Reactions   Penicillins Hives, Rash   Has patient had a PCN reaction causing immediate rash, facial/tongue/throat swelling, SOB or lightheadedness with hypotension: Yes Has patient had a PCN reaction causing severe rash involving mucus membranes or skin necrosis: Yes Has patient had a PCN reaction that required hospitalization: No Has patient had a PCN reaction occurring within the last 10 years: No If all of the above answers are "NO", then may proceed with Cephalosporin use.   Hct [hydrochlorothiazide] Other (See Comments)   hyper   Statins Other (See Comments)   Myalgia, tolerates low dosages of lovastatin         Medication List        Accurate as of May 11, 2022  2:50 PM. If you have any questions, ask your nurse or doctor.          acetaminophen 500 MG tablet Commonly known as: TYLENOL Take 1,000 mg by mouth every 6 (six) hours as needed for moderate pain.   azelastine 0.1 % nasal spray Commonly known as: ASTELIN Place 1 spray into both nostrils 2 (two) times daily. Use in each nostril as directed   cefadroxil 1 g tablet Commonly known as: DURICEF Take 1 tablet (1 g total) by mouth 2 (two) times daily for 4 days.   cetirizine-pseudoephedrine 5-120 MG tablet Commonly known  as: ZYRTEC-D Take once per day as need for congestion/runny nose   diltiazem 120 MG 24 hr capsule Commonly known as: CARDIZEM CD Take one capsule daily   ezetimibe 10 MG tablet Commonly known as: ZETIA Take 1 tablet (10 mg total) by mouth daily.   finasteride 5 MG tablet Commonly known as: PROSCAR Take one tablet each day   fluticasone 50 MCG/ACT nasal spray Commonly known as: FLONASE Place 1 spray into both nostrils daily as needed for allergies or rhinitis.   furosemide 20 MG tablet Commonly known as: LASIX Take 1 tablet (20 mg total) by mouth daily.   lovastatin 20 MG tablet Commonly known as: MEVACOR TAKE 1/2 TABLET EVERY OTHER DAY   metoprolol tartrate 100 MG tablet Commonly known as: LOPRESSOR Take 1 tablet (100 mg total) by mouth 2 (two) times daily.   montelukast 10 MG tablet Commonly known as: SINGULAIR Take 1 tablet (10 mg total) by mouth at bedtime.   SYSTANE OP Place 1 drop into both eyes 3 (three) times daily as needed (dry eyes).   Vitamin D3 125 MCG (5000 UT) Caps Take 5,000 Units by mouth once a week.   warfarin 5 MG tablet Commonly known as: COUMADIN Take as directed by the anticoagulation clinic. If you are unsure how to take this medication, talk to your nurse or doctor. Original instructions: TAKE 1 TABLET ON SAT, SUN, TUES, & THURSTAKE 1/2 TABLET ON MON, WED, & FRIDAY  Home Care and Equipment/Supplies: Were home health services ordered? no If so, what is the name of the agency? N/a  Has the agency set up a time to come to the patient's home? not applicable Were any new equipment or medical supplies ordered?  No What is the name of the medical supply agency? N/a Were you able to get the supplies/equipment? not applicable Do you have any questions related to the use of the equipment or supplies? No  Functional Questionnaire: (I = Independent and D = Dependent) ADLs: d  Bathing/Dressing- d  Meal Prep- d  Eating-  i  Maintaining continence- i  Transferring/Ambulation- d  Managing Meds- i  Follow up appointments reviewed:  PCP Hospital f/u appt confirmed? Yes  Scheduled to see Dr [name] on 05/15/22 @ 355p. [facility] f/u appt confirmed? No   Are transportation arrangements needed? No  If their condition worsens, is the pt aware to call PCP or go to the Emergency Dept.? Yes Was the patient provided with contact information for the PCP's office or ED? Yes Was to pt encouraged to call back with questions or concerns? Yes

## 2022-05-11 NOTE — Progress Notes (Signed)
  Transition of Care (TOC) Screening Note   Patient Details  Name: [name] Date of Birth: [birth year]   Transition of Care [facility] CM/SW Contact:    Iona Beard, [place] Phone Number: 05/11/2022, 10:24 AM    Transition of Care Department [facility]) has reviewed patient and no TOC needs have been identified at this time. We will continue to monitor patient advancement through interdisciplinary progression rounds. If new patient transition needs arise, please place a TOC consult.

## 2022-05-11 NOTE — Care Management Important Message (Signed)
Important Message  Patient Details  Name: [name] MRN: [id number] Date of Birth: 29-Oct-1931   Medicare Important Message Given:  N/A - LOS <3 / Initial given by admissions     Tommy Medal 05/11/2022, 11:13 AM

## 2022-05-11 NOTE — Progress Notes (Signed)
Nsg Discharge Note  Admit Date:  05/08/2022 Discharge date: 05/11/2022   [name] A Berkley to be D/C'd Home per MD order.  AVS completed.  Copy for chart, and copy for patient signed, and dated. Patient/caregiver able to verbalize understanding.  Discharge Medication: Allergies as of 05/11/2022       Reactions   Penicillins Hives, Rash   Has patient had a PCN reaction causing immediate rash, facial/tongue/throat swelling, SOB or lightheadedness with hypotension: Yes Has patient had a PCN reaction causing severe rash involving mucus membranes or skin necrosis: Yes Has patient had a PCN reaction that required hospitalization: No Has patient had a PCN reaction occurring within the last 10 years: No If all of the above answers are "NO", then may proceed with Cephalosporin use.   Hct [hydrochlorothiazide] Other (See Comments)   hyper   Statins Other (See Comments)   Myalgia, tolerates low dosages of lovastatin         Medication List     TAKE these medications    acetaminophen 500 MG tablet Commonly known as: TYLENOL Take 1,000 mg by mouth every 6 (six) hours as needed for moderate pain.   azelastine 0.1 % nasal spray Commonly known as: ASTELIN Place 1 spray into both nostrils 2 (two) times daily. Use in each nostril as directed   cefadroxil 1 g tablet Commonly known as: DURICEF Take 1 tablet (1 g total) by mouth 2 (two) times daily for 4 days.   cetirizine-pseudoephedrine 5-120 MG tablet Commonly known as: ZYRTEC-D Take once per day as need for congestion/runny nose   diltiazem 120 MG 24 hr capsule Commonly known as: CARDIZEM CD Take one capsule daily   ezetimibe 10 MG tablet Commonly known as: ZETIA Take 1 tablet (10 mg total) by mouth daily.   finasteride 5 MG tablet Commonly known as: PROSCAR Take one tablet each day   fluticasone 50 MCG/ACT nasal spray Commonly known as: FLONASE Place 1 spray into both nostrils daily as needed for allergies or rhinitis.    furosemide 20 MG tablet Commonly known as: LASIX Take 1 tablet (20 mg total) by mouth daily.   lovastatin 20 MG tablet Commonly known as: MEVACOR TAKE 1/2 TABLET EVERY OTHER DAY   metoprolol tartrate 100 MG tablet Commonly known as: LOPRESSOR Take 1 tablet (100 mg total) by mouth 2 (two) times daily.   montelukast 10 MG tablet Commonly known as: SINGULAIR Take 1 tablet (10 mg total) by mouth at bedtime.   SYSTANE OP Place 1 drop into both eyes 3 (three) times daily as needed (dry eyes).   Vitamin D3 125 MCG (5000 UT) Caps Take 5,000 Units by mouth once a week.   warfarin 5 MG tablet Commonly known as: COUMADIN Take as directed. If you are unsure how to take this medication, talk to your nurse or doctor. Original instructions: TAKE 1 TABLET ON SAT, SUN, TUES, & THURSTAKE 1/2 TABLET ON MON, WED, & FRIDAY        Discharge Assessment: Vitals:   05/11/22 0856 05/11/22 0857  BP: 109/82 109/82  Pulse:  96  Resp:    Temp:    SpO2:     Skin clean, dry and intact without evidence of skin break down, no evidence of skin tears noted. IV catheter discontinued intact. Site without signs and symptoms of complications - no redness or edema noted at insertion site, patient denies c/o pain - only slight tenderness at site.  Dressing with slight pressure applied.  D/c Instructions-Education: Discharge  instructions given to patient/family with verbalized understanding. D/c education completed with patient/family including follow up instructions, medication list, d/c activities limitations if indicated, with other d/c instructions as indicated by MD - patient able to verbalize understanding, all questions fully answered. Patient instructed to return to ED, call 911, or call MD for any changes in condition.  Patient escorted via [place], and D/C home via private auto.  Dorcas Mcmurray, RN 05/11/2022 11:18 AM

## 2022-05-11 NOTE — Progress Notes (Signed)
ANTICOAGULATION CONSULT NOTE - Follow Up Consult   Pharmacy Consult for warfarin dosing  Indication: atrial fibrillation   Allergies  Allergen Reactions   Penicillins Hives and Rash    Has patient had a PCN reaction causing immediate rash, facial/tongue/throat swelling, SOB or lightheadedness with hypotension: Yes Has patient had a PCN reaction causing severe rash involving mucus membranes or skin necrosis: Yes Has patient had a PCN reaction that required hospitalization: No Has patient had a PCN reaction occurring within the last 10 years: No If all of the above answers are "NO", then may proceed with Cephalosporin use.    Hct [Hydrochlorothiazide] Other (See Comments)    hyper   Statins Other (See Comments)    Myalgia, tolerates low dosages of lovastatin       Patient Measurements: Last Weight  Most recent update: 05/09/2022  7:06 AM    Weight  84.3 kg (185 lb 13.6 oz)            Body mass index is 30 kg/m. [name]               Temp: 98.2 F (36.8 C) (07/31 0555) Temp Source: Oral (07/31 0555) BP: 137/94 (07/31 0555) Pulse Rate: 92 (07/31 0555)  Labs: Recent Labs    05/08/22 1445 05/09/22 0422 05/10/22 0518 05/11/22 0502  HGB  --  11.5* 11.9* 12.5*  HCT  --  36.0* 36.7* 38.6*  PLT  --  203 204 217  APTT 35  --   --   --   LABPROT 27.1* 26.4* 27.1* 24.9*  INR 2.5* 2.5* 2.5* 2.3*  CREATININE  --  1.08 0.91 0.94     Estimated Creatinine Clearance: 56.4 mL/min (by C-G formula based on SCr of 0.94 mg/dL).     Medications:  Medications Prior to Admission  Medication Sig Dispense Refill Last Dose   acetaminophen (TYLENOL) 500 MG tablet Take 1,000 mg by mouth every 6 (six) hours as needed for moderate pain.   05/08/2022   azelastine (ASTELIN) 0.1 % nasal spray Place 1 spray into both nostrils 2 (two) times daily. Use in each nostril as directed 30 mL 12 05/07/2022   cetirizine-pseudoephedrine (ZYRTEC-D) 5-120 MG tablet Take once per day as need for  congestion/runny nose 10 tablet 0 05/07/2022   Cholecalciferol (VITAMIN D3) 5000 units CAPS Take 5,000 Units by mouth once a week.    Past Week   diltiazem (CARDIZEM CD) 120 MG 24 hr capsule Take one capsule daily 90 capsule 3 05/08/2022   ezetimibe (ZETIA) 10 MG tablet Take 1 tablet (10 mg total) by mouth daily. 90 tablet 3 05/08/2022   finasteride (PROSCAR) 5 MG tablet Take one tablet each day 90 tablet 3 05/07/2022   furosemide (LASIX) 20 MG tablet Take 1 tablet (20 mg total) by mouth daily. 90 tablet 3 05/08/2022   lovastatin (MEVACOR) 20 MG tablet TAKE 1/2 TABLET EVERY OTHER DAY 23 tablet 3 05/07/2022   metoprolol tartrate (LOPRESSOR) 100 MG tablet Take 1 tablet (100 mg total) by mouth 2 (two) times daily. 180 tablet 3 05/08/2022 at 0700   montelukast (SINGULAIR) 10 MG tablet Take 1 tablet (10 mg total) by mouth at bedtime. 30 tablet 3 05/07/2022   Polyethyl Glycol-Propyl Glycol (SYSTANE OP) Place 1 drop into both eyes 3 (three) times daily as needed (dry eyes).    05/08/2022   warfarin (COUMADIN) 5 MG tablet TAKE 1 TABLET ON SAT, SUN, TUES, & THURSTAKE 1/2 TABLET ON MON, WED, & FRIDAY  70 tablet 3 05/08/2022 at 0700   fluticasone (FLONASE) 50 MCG/ACT nasal spray Place 1 spray into both nostrils daily as needed for allergies or rhinitis.      Scheduled:   bisacodyl  10 mg Rectal Once   Chlorhexidine Gluconate Cloth  6 each Topical Q0600   diltiazem  120 mg Oral Daily   ezetimibe  10 mg Oral Daily   finasteride  5 mg Oral Daily   metoprolol tartrate  100 mg Oral BID   montelukast  10 mg Oral QHS   pravastatin  20 mg Oral QODAY   traZODone  50 mg Oral QHS   warfarin  2.5 mg Oral Q M,W,F   Warfarin - Pharmacist Dosing Inpatient   Does not apply q1600   Infusions:   cefTRIAXone (ROCEPHIN)  IV Stopped (05/10/22 1305)   PRN: acetaminophen **OR** acetaminophen, ipratropium-albuterol, ondansetron **OR** ondansetron (ZOFRAN) IV, sodium chloride Anti-infectives (From admission, onward)    Start      Dose/Rate Route Frequency Ordered Stop   05/09/22 2045  cefTRIAXone (ROCEPHIN) 2 g in sodium chloride 0.9 % 100 mL IVPB  Status:  Discontinued        2 g 200 mL/hr over 30 Minutes Intravenous Every 24 hours 05/09/22 1129 05/09/22 1129   05/09/22 1230  cefTRIAXone (ROCEPHIN) 2 g in sodium chloride 0.9 % 100 mL IVPB        2 g 200 mL/hr over 30 Minutes Intravenous Every 24 hours 05/09/22 1130     05/09/22 1200  cefTRIAXone (ROCEPHIN) 2 g in sodium chloride 0.9 % 100 mL IVPB  Status:  Discontinued        2 g 200 mL/hr over 30 Minutes Intravenous Every 24 hours 05/09/22 1129 05/09/22 1130   05/08/22 2045  cefTRIAXone (ROCEPHIN) 1 g in sodium chloride 0.9 % 100 mL IVPB  Status:  Discontinued        1 g 200 mL/hr over 30 Minutes Intravenous Every 24 hours 05/08/22 1949 05/09/22 1129   05/08/22 1715  ciprofloxacin (CIPRO) IVPB 400 mg        400 mg 200 mL/hr over 60 Minutes Intravenous  Once 05/08/22 1711 05/08/22 1845       Goal of Therapy:  INR 2-3 Monitor platelets by anticoagulation protocol: Yes    Prior to Admission Warfarin Dosing:  [name] takes warfarin Home regimen:  5mg  Su, Tu, Th, Sa 2.5mg  MWF     Admit INR was 2.5 Lab Results  Component Value Date   INR 2.3 (H) 05/11/2022   INR 2.5 (H) 05/10/2022   INR 2.5 (H) 05/09/2022    Assessment: [name] a 88 y.o. male requires anticoagulation with warfarin for the indication of  atrial fibrillation. Warfarin will be initiated inpatient following pharmacy protocol per pharmacy consult. Patient most recent blood work is as follows:     Latest Ref Rng & Units 05/11/2022    5:02 AM 05/10/2022    5:18 AM 05/09/2022    4:22 AM  CBC  WBC 4.0 - 10.5 K/uL 12.7  13.8  17.1   Hemoglobin 13.0 - 17.0 g/dL 12.5  11.9  11.5   Hematocrit 39.0 - 52.0 % 38.6  36.7  36.0   Platelets 150 - 400 K/uL 217  204  203    INR 2.3 therapeutic. Followed in [facility]  Plan: Warfarin po 5mg  Su, Tu, Th, Sa  and 2.5mg  MWF Monitor CBC MWF with am labs   Monitor  INR daily Monitor for signs and symptoms of bleeding   Isac Sarna, BS Pharm D, BCPS Clinical Pharmacist

## 2022-05-12 ENCOUNTER — Telehealth: Payer: Self-pay | Admitting: Family Medicine

## 2022-05-12 NOTE — Telephone Encounter (Signed)
Spoke with wife, appointment scheduled 05/22/22 at 8:55 am with Dr. [name] Parisian.

## 2022-05-13 LAB — CULTURE, BLOOD (ROUTINE X 2): Culture: NO GROWTH

## 2022-05-15 ENCOUNTER — Inpatient Hospital Stay: Payer: Medicare Other | Admitting: Family Medicine

## 2022-05-22 ENCOUNTER — Ambulatory Visit (INDEPENDENT_AMBULATORY_CARE_PROVIDER_SITE_OTHER): Payer: Medicare Other | Admitting: Family Medicine

## 2022-05-22 ENCOUNTER — Ambulatory Visit (INDEPENDENT_AMBULATORY_CARE_PROVIDER_SITE_OTHER): Payer: Medicare Other

## 2022-05-22 ENCOUNTER — Encounter: Payer: Self-pay | Admitting: Family Medicine

## 2022-05-22 VITALS — BP 151/81 | HR 118 | Temp 97.1°F | Ht 66.0 in | Wt 181.0 lb

## 2022-05-22 DIAGNOSIS — A419 Sepsis, unspecified organism: Secondary | ICD-10-CM

## 2022-05-22 DIAGNOSIS — N3000 Acute cystitis without hematuria: Secondary | ICD-10-CM

## 2022-05-22 DIAGNOSIS — N390 Urinary tract infection, site not specified: Secondary | ICD-10-CM | POA: Diagnosis not present

## 2022-05-22 DIAGNOSIS — R221 Localized swelling, mass and lump, neck: Secondary | ICD-10-CM

## 2022-05-22 LAB — CBC WITH DIFFERENTIAL/PLATELET
Basophils Absolute: 0.1 10*3/uL (ref 0.0–0.2)
Basos: 1 %
EOS (ABSOLUTE): 0.3 10*3/uL (ref 0.0–0.4)
Eos: 2 %
Hematocrit: 39.4 % (ref 37.5–51.0)
Hemoglobin: 13.3 g/dL (ref 13.0–17.7)
Immature Grans (Abs): 0.1 10*3/uL (ref 0.0–0.1)
Immature Granulocytes: 1 %
Lymphocytes Absolute: 5.1 10*3/uL — ABNORMAL HIGH (ref 0.7–3.1)
Lymphs: 32 %
MCH: 33 pg (ref 26.6–33.0)
MCHC: 33.8 g/dL (ref 31.5–35.7)
MCV: 98 fL — ABNORMAL HIGH (ref 79–97)
Monocytes Absolute: 0.9 10*3/uL (ref 0.1–0.9)
Monocytes: 6 %
Neutrophils Absolute: 9.3 10*3/uL — ABNORMAL HIGH (ref 1.4–7.0)
Neutrophils: 58 %
Platelets: 372 10*3/uL (ref 150–450)
RBC: 4.03 x10E6/uL — ABNORMAL LOW (ref 4.14–5.80)
RDW: 11.6 % (ref 11.6–15.4)
WBC: 15.8 10*3/uL — ABNORMAL HIGH (ref 3.4–10.8)

## 2022-05-22 LAB — CMP14+EGFR
ALT: 30 IU/L (ref 0–44)
AST: 26 IU/L (ref 0–40)
Albumin/Globulin Ratio: 1.5 (ref 1.2–2.2)
Albumin: 4.2 g/dL (ref 3.7–4.7)
Alkaline Phosphatase: 88 IU/L (ref 44–121)
BUN/Creatinine Ratio: 12 (ref 10–24)
BUN: 13 mg/dL (ref 8–27)
Bilirubin Total: 0.6 mg/dL (ref 0.0–1.2)
CO2: 25 mmol/L (ref 20–29)
Calcium: 9.4 mg/dL (ref 8.6–10.2)
Chloride: 101 mmol/L (ref 96–106)
Creatinine, Ser: 1.11 mg/dL (ref 0.76–1.27)
Globulin, Total: 2.8 g/dL (ref 1.5–4.5)
Glucose: 122 mg/dL — ABNORMAL HIGH (ref 70–99)
Potassium: 4.4 mmol/L (ref 3.5–5.2)
Sodium: 142 mmol/L (ref 134–144)
Total Protein: 7 g/dL (ref 6.0–8.5)
eGFR: 64 mL/min/{1.73_m2} (ref >59)

## 2022-05-22 LAB — MICROSCOPIC EXAMINATION
Bacteria, UA: NONE SEEN
Renal Epithel, UA: NONE SEEN /hpf

## 2022-05-22 LAB — URINALYSIS, COMPLETE
Bilirubin, UA: NEGATIVE
Glucose, UA: NEGATIVE
Leukocytes,UA: NEGATIVE
Nitrite, UA: NEGATIVE
Specific Gravity, UA: 1.02 (ref 1.005–1.030)
Urobilinogen, Ur: 1 mg/dL (ref 0.2–1.0)
pH, UA: 6 (ref 5.0–7.5)

## 2022-05-22 NOTE — Progress Notes (Signed)
BP (!) 151/81   Pulse (!) 118   Temp (!) 97.1 F (36.2 C)   Ht 5' 6"  (1.676 m)   Wt 181 lb (82.1 kg)   SpO2 96%   BMI 29.21 kg/m    Subjective:   Patient ID: [name], male    DOB: 13-Jan-1932, 88 y.o.   MRN: [id number]  HPI: [name] is a 88 y.o. male presenting on 05/22/2022 for Medical Management of Chronic Issues [facility] follow up/ Sepsis due to UTI)   HPI Sepsis and UTI Hospital follow-up Patient is coming in for sepsis and UTI Hospital follow-up.  Patient now is feeling a lot better urinary wise and his mental confusion is back to baseline and back to normal.  They are just concerned with how fast came on and wondering if his prostate could be a factor.  We did discuss that it could be a factor if he is having urinary retention.  He says that he is feeling a lot better now and he just finished the antibiotic 3 days ago and wonders what he can do to help prevent it or keep this from coming back.  He denies any fevers or chills or abdominal pain or flank pain.  His only other complaint today is he has a large neck mass that he says just come on over the past month.  He says it started out small red he could barely feel it above his clavicle on the right side but now it is large and he says he feels like it affects his throat and his breathing sometimes because of how large it is.  It is firm but is also slightly tender.  Relevant past medical, surgical, family and social history reviewed and updated as indicated. Interim medical history since our last visit reviewed. Allergies and medications reviewed and updated.  Review of Systems  Constitutional:  Negative for chills and fever.  Eyes:  Negative for visual disturbance.  Respiratory:  Negative for shortness of breath and wheezing.   Cardiovascular:  Negative for chest pain and leg swelling.  Musculoskeletal:  Negative for arthralgias, back pain and gait problem.  Skin:  Negative for rash.  Neurological:  Negative for  dizziness and light-headedness.  All other systems reviewed and are negative.   Per HPI unless specifically indicated above   Allergies as of 05/22/2022       Reactions   Penicillins Hives, Rash   Has patient had a PCN reaction causing immediate rash, facial/tongue/throat swelling, SOB or lightheadedness with hypotension: Yes Has patient had a PCN reaction causing severe rash involving mucus membranes or skin necrosis: Yes Has patient had a PCN reaction that required hospitalization: No Has patient had a PCN reaction occurring within the last 10 years: No If all of the above answers are "NO", then may proceed with Cephalosporin use.   Hct [hydrochlorothiazide] Other (See Comments)   hyper   Statins Other (See Comments)   Myalgia, tolerates low dosages of lovastatin         Medication List        Accurate as of May 22, 2022  9:49 AM. If you have any questions, ask your nurse or doctor.          acetaminophen 500 MG tablet Commonly known as: TYLENOL Take 1,000 mg by mouth every 6 (six) hours as needed for moderate pain.   azelastine 0.1 % nasal spray Commonly known as: ASTELIN Place 1 spray into both nostrils 2 (two)  times daily. Use in each nostril as directed   cetirizine-pseudoephedrine 5-120 MG tablet Commonly known as: ZYRTEC-D Take once per day as need for congestion/runny nose   diltiazem 120 MG 24 hr capsule Commonly known as: CARDIZEM CD Take one capsule daily   ezetimibe 10 MG tablet Commonly known as: ZETIA Take 1 tablet (10 mg total) by mouth daily.   finasteride 5 MG tablet Commonly known as: PROSCAR Take one tablet each day   fluticasone 50 MCG/ACT nasal spray Commonly known as: FLONASE Place 1 spray into both nostrils daily as needed for allergies or rhinitis.   furosemide 20 MG tablet Commonly known as: LASIX Take 1 tablet (20 mg total) by mouth daily.   lovastatin 20 MG tablet Commonly known as: MEVACOR TAKE 1/2 TABLET EVERY OTHER  DAY   metoprolol tartrate 100 MG tablet Commonly known as: LOPRESSOR Take 1 tablet (100 mg total) by mouth 2 (two) times daily.   montelukast 10 MG tablet Commonly known as: SINGULAIR Take 1 tablet (10 mg total) by mouth at bedtime.   SYSTANE OP Place 1 drop into both eyes 3 (three) times daily as needed (dry eyes).   Vitamin D3 125 MCG (5000 UT) Caps Take 5,000 Units by mouth once a week.   warfarin 5 MG tablet Commonly known as: COUMADIN Take as directed by the anticoagulation clinic. If you are unsure how to take this medication, talk to your nurse or doctor. Original instructions: TAKE 1 TABLET ON SAT, SUN, TUES, & THURSTAKE 1/2 TABLET ON MON, WED, & FRIDAY         Objective:   BP (!) 151/81   Pulse (!) 118   Temp (!) 97.1 F (36.2 C)   Ht 5' 6"  (1.676 m)   Wt 181 lb (82.1 kg)   SpO2 96%   BMI 29.21 kg/m   Wt Readings from Last 3 Encounters:  05/22/22 181 lb (82.1 kg)  05/09/22 185 lb 13.6 oz (84.3 kg)  05/01/22 178 lb (80.7 kg)    Physical Exam Vitals and nursing note reviewed.  Constitutional:      General: He is not in acute distress.    Appearance: He is well-developed. He is not diaphoretic.  Eyes:     General: No scleral icterus.    Conjunctiva/sclera: Conjunctivae normal.  Neck:     Thyroid: No thyromegaly.  Cardiovascular:     Rate and Rhythm: Normal rate and regular rhythm.     Heart sounds: Normal heart sounds. No murmur heard. Pulmonary:     Effort: Pulmonary effort is normal. No respiratory distress.     Breath sounds: Normal breath sounds. No wheezing.  Musculoskeletal:     Cervical back: Neck supple.  Lymphadenopathy:     Cervical: No cervical adenopathy.  Skin:    General: Skin is warm and dry.     Findings: No rash.  Neurological:     Mental Status: He is alert and oriented to person, place, and time.     Coordination: Coordination normal.  Psychiatric:        Behavior: Behavior normal.     Chest x-ray: Await final read from  radiology  Assessment & Plan:   Problem List Items Addressed This Visit       Other   Sepsis secondary to UTI [facility]) - Primary   Relevant Orders   CBC with Differential/Platelet   CMP14+EGFR   Urinalysis, Complete   Urine Culture   Other Visit Diagnoses     Acute cystitis  without hematuria       Relevant Orders   CBC with Differential/Platelet   CMP14+EGFR   Urinalysis, Complete   Urine Culture   Nodule of neck       Relevant Orders   DG Chest 2 View   CT Soft Tissue Neck W Contrast       X-ray is inconclusive, will do CT head and neck.  Urinalysis and blood work pending. Follow up plan: Return if symptoms worsen or fail to improve.  Counseling provided for all of the vaccine components Orders Placed This Encounter  Procedures   Urine Culture   DG Chest 2 View   CT Soft Tissue Neck W Contrast   CBC with Differential/Platelet   CMP14+EGFR   Urinalysis, Complete    Caryl Pina, MD [facility] 05/22/2022, 9:49 AM

## 2022-05-23 LAB — URINE CULTURE: Organism ID, Bacteria: NO GROWTH

## 2022-05-26 ENCOUNTER — Telehealth: Payer: Self-pay | Admitting: Family Medicine

## 2022-05-26 NOTE — Telephone Encounter (Signed)
Spouse is UPSET that she has not received an apt for MRI by now. She says that this is uncalled for. Spouse says that she was told by [name] to call by today if an apt was not scheduled. I tried to explain to spouse that the ordered has been sent to referral and referrals will be calling her with an apt. Spouse was not having it.

## 2022-05-27 ENCOUNTER — Encounter (HOSPITAL_COMMUNITY): Payer: Self-pay | Admitting: Radiology

## 2022-05-27 ENCOUNTER — Ambulatory Visit (HOSPITAL_COMMUNITY)
Admission: RE | Admit: 2022-05-27 | Discharge: 2022-05-27 | Disposition: A | Discharge status: Home / Self Care | Payer: Medicare Other | Source: Ambulatory Visit | Attending: Family Medicine | Admitting: Family Medicine

## 2022-05-27 DIAGNOSIS — R221 Localized swelling, mass and lump, neck: Secondary | ICD-10-CM | POA: Diagnosis not present

## 2022-05-27 MED ORDER — IOHEXOL 300 MG/ML  SOLN
100.0000 mL | Freq: Once | INTRAMUSCULAR | Status: AC | PRN
  Administered 2022-05-27: 75 mL via INTRAVENOUS

## 2022-05-27 NOTE — Telephone Encounter (Signed)
Spoke with wife. They have received a call and our on their way to Radiology at [facility]

## 2022-05-27 NOTE — Telephone Encounter (Signed)
I did think that I put in the referral stat chat, I put it in again today and I have not received an answer, I do think they are working on it right now finally is what I have her and hopefully we will have it scheduled before the end of the day or tomorrow

## 2022-05-27 NOTE — Telephone Encounter (Signed)
Did this get put in referral stat chat because courtney is off till 08/21.

## 2022-05-28 ENCOUNTER — Ambulatory Visit (HOSPITAL_COMMUNITY)
Admission: RE | Admit: 2022-05-28 | Discharge: 2022-05-28 | Disposition: A | Discharge status: Home / Self Care | Payer: Medicare Other | Source: Ambulatory Visit | Attending: Family Medicine | Admitting: Family Medicine

## 2022-05-28 ENCOUNTER — Ambulatory Visit (INDEPENDENT_AMBULATORY_CARE_PROVIDER_SITE_OTHER): Payer: Medicare Other | Admitting: Family Medicine

## 2022-05-28 DIAGNOSIS — K8020 Calculus of gallbladder without cholecystitis without obstruction: Secondary | ICD-10-CM | POA: Diagnosis not present

## 2022-05-28 DIAGNOSIS — K7689 Other specified diseases of liver: Secondary | ICD-10-CM | POA: Diagnosis not present

## 2022-05-28 DIAGNOSIS — C770 Secondary and unspecified malignant neoplasm of lymph nodes of head, face and neck: Secondary | ICD-10-CM | POA: Insufficient documentation

## 2022-05-28 DIAGNOSIS — K449 Diaphragmatic hernia without obstruction or gangrene: Secondary | ICD-10-CM | POA: Diagnosis not present

## 2022-05-28 DIAGNOSIS — R918 Other nonspecific abnormal finding of lung field: Secondary | ICD-10-CM | POA: Diagnosis not present

## 2022-05-28 DIAGNOSIS — N281 Cyst of kidney, acquired: Secondary | ICD-10-CM | POA: Diagnosis not present

## 2022-05-28 DIAGNOSIS — J9859 Other diseases of mediastinum, not elsewhere classified: Secondary | ICD-10-CM | POA: Diagnosis not present

## 2022-05-28 DIAGNOSIS — I700 Atherosclerosis of aorta: Secondary | ICD-10-CM | POA: Diagnosis not present

## 2022-05-28 MED ORDER — IOHEXOL 9 MG/ML PO SOLN
ORAL | Status: AC
  Filled 2022-05-28: qty 1000

## 2022-05-28 MED ORDER — IOHEXOL 300 MG/ML  SOLN
100.0000 mL | Freq: Once | INTRAMUSCULAR | Status: AC | PRN
  Administered 2022-05-28: 100 mL via INTRAVENOUS

## 2022-05-28 NOTE — Progress Notes (Signed)
There were no vitals taken for this visit.   Subjective:   Patient ID: [name], male    DOB: [birth year], 88 y.o.   MRN: [id number]  HPI: [name] is a 88 y.o. male presenting on 05/28/2022 for No chief complaint on file.   HPI Follow-up for metastatic cancer that was found in his neck lymph nodes and to do further imaging.  Initial scans CT head and neck and found multiple lesions in the upper apices of the lungs as well.  Patient is understanding of what is going on, brought his wife and his sister with him here today and they understand what the process is going to be.  He denies any chest pain or shortness of breath currently.  Relevant past medical, surgical, family and social history reviewed and updated as indicated. Interim medical history since our last visit reviewed. Allergies and medications reviewed and updated.  Review of Systems  Constitutional:  Negative for chills and fever.  Respiratory:  Negative for shortness of breath and wheezing.   Cardiovascular:  Negative for chest pain and leg swelling.  Musculoskeletal:  Negative for back pain and gait problem.  Skin:  Negative for color change and rash.  All other systems reviewed and are negative.   Per HPI unless specifically indicated above   Allergies as of 05/28/2022       Reactions   Penicillins Hives, Rash   Has patient had a PCN reaction causing immediate rash, facial/tongue/throat swelling, SOB or lightheadedness with hypotension: Yes Has patient had a PCN reaction causing severe rash involving mucus membranes or skin necrosis: Yes Has patient had a PCN reaction that required hospitalization: No Has patient had a PCN reaction occurring within the last 10 years: No If all of the above answers are "NO", then may proceed with Cephalosporin use.   Hct [hydrochlorothiazide] Other (See Comments)   hyper   Statins Other (See Comments)   Myalgia, tolerates low dosages of lovastatin         Medication  List        Accurate as of May 28, 2022  9:34 AM. If you have any questions, ask your nurse or doctor.          acetaminophen 500 MG tablet Commonly known as: TYLENOL Take 1,000 mg by mouth every 6 (six) hours as needed for moderate pain.   azelastine 0.1 % nasal spray Commonly known as: ASTELIN Place 1 spray into both nostrils 2 (two) times daily. Use in each nostril as directed   cetirizine-pseudoephedrine 5-120 MG tablet Commonly known as: ZYRTEC-D Take once per day as need for congestion/runny nose   diltiazem 120 MG 24 hr capsule Commonly known as: CARDIZEM CD Take one capsule daily   ezetimibe 10 MG tablet Commonly known as: ZETIA Take 1 tablet (10 mg total) by mouth daily.   finasteride 5 MG tablet Commonly known as: PROSCAR Take one tablet each day   fluticasone 50 MCG/ACT nasal spray Commonly known as: FLONASE Place 1 spray into both nostrils daily as needed for allergies or rhinitis.   furosemide 20 MG tablet Commonly known as: LASIX Take 1 tablet (20 mg total) by mouth daily.   lovastatin 20 MG tablet Commonly known as: MEVACOR TAKE 1/2 TABLET EVERY OTHER DAY   metoprolol tartrate 100 MG tablet Commonly known as: LOPRESSOR Take 1 tablet (100 mg total) by mouth 2 (two) times daily.   montelukast 10 MG tablet Commonly known as: SINGULAIR Take 1 tablet (  10 mg total) by mouth at bedtime.   SYSTANE OP Place 1 drop into both eyes 3 (three) times daily as needed (dry eyes).   Vitamin D3 125 MCG (5000 UT) Caps Take 5,000 Units by mouth once a week.   warfarin 5 MG tablet Commonly known as: COUMADIN Take as directed by the anticoagulation clinic. If you are unsure how to take this medication, talk to your nurse or doctor. Original instructions: TAKE 1 TABLET ON SAT, SUN, TUES, & THURSTAKE 1/2 TABLET ON MON, WED, & FRIDAY         Objective:   There were no vitals taken for this visit.  Wt Readings from Last 3 Encounters:  05/22/22 181 lb  (82.1 kg)  05/09/22 185 lb 13.6 oz (84.3 kg)  05/01/22 178 lb (80.7 kg)    Physical Exam Vitals and nursing note reviewed.  Constitutional:      Appearance: Normal appearance.  Lymphadenopathy:     Cervical: Cervical adenopathy present.  Neurological:     Mental Status: He is alert.       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  Counseling provided for all of the vaccine components Orders Placed This Encounter  Procedures   CT CHEST [place]   Ambulatory referral to Hematology / Oncology   Ambulatory referral to [facility] Ramandeep Arington, MD [facility] 05/28/2022, 9:34 AM

## 2022-06-03 ENCOUNTER — Inpatient Hospital Stay: Payer: Medicare Other | Attending: Hematology | Admitting: Hematology

## 2022-06-03 ENCOUNTER — Inpatient Hospital Stay: Payer: Medicare Other

## 2022-06-03 ENCOUNTER — Encounter: Payer: Self-pay | Admitting: Hematology

## 2022-06-03 DIAGNOSIS — Z87891 Personal history of nicotine dependence: Secondary | ICD-10-CM | POA: Diagnosis not present

## 2022-06-03 DIAGNOSIS — R634 Abnormal weight loss: Secondary | ICD-10-CM | POA: Diagnosis not present

## 2022-06-03 DIAGNOSIS — Z801 Family history of malignant neoplasm of trachea, bronchus and lung: Secondary | ICD-10-CM | POA: Insufficient documentation

## 2022-06-03 DIAGNOSIS — R590 Localized enlarged lymph nodes: Secondary | ICD-10-CM | POA: Insufficient documentation

## 2022-06-03 DIAGNOSIS — I10 Essential (primary) hypertension: Secondary | ICD-10-CM | POA: Insufficient documentation

## 2022-06-03 DIAGNOSIS — R918 Other nonspecific abnormal finding of lung field: Secondary | ICD-10-CM | POA: Insufficient documentation

## 2022-06-03 DIAGNOSIS — R599 Enlarged lymph nodes, unspecified: Secondary | ICD-10-CM | POA: Diagnosis not present

## 2022-06-03 DIAGNOSIS — D72820 Lymphocytosis (symptomatic): Secondary | ICD-10-CM | POA: Insufficient documentation

## 2022-06-03 LAB — COMPREHENSIVE METABOLIC PANEL
ALT: 18 U/L (ref 0–44)
AST: 26 U/L (ref 15–41)
Albumin: 4 g/dL (ref 3.5–5.0)
Alkaline Phosphatase: 68 U/L (ref 38–126)
Anion gap: 8 (ref 5–15)
BUN: 13 mg/dL (ref 8–23)
CO2: 28 mmol/L (ref 22–32)
Calcium: 9.2 mg/dL (ref 8.9–10.3)
Chloride: 106 mmol/L (ref 98–111)
Creatinine, Ser: 1.05 mg/dL (ref 0.61–1.24)
GFR, Estimated: >60 mL/min (ref >60)
Glucose, Bld: 104 mg/dL — ABNORMAL HIGH (ref 70–99)
Potassium: 4.2 mmol/L (ref 3.5–5.1)
Sodium: 142 mmol/L (ref 135–145)
Total Bilirubin: 1.1 mg/dL (ref 0.3–1.2)
Total Protein: 7.7 g/dL (ref 6.5–8.1)

## 2022-06-03 LAB — CBC WITH DIFFERENTIAL/PLATELET
Abs Immature Granulocytes: 0.05 10*3/uL (ref 0.00–0.07)
Basophils Absolute: 0.1 10*3/uL (ref 0.0–0.1)
Basophils Relative: 0 %
Eosinophils Absolute: 0.3 10*3/uL (ref 0.0–0.5)
Eosinophils Relative: 3 %
HCT: 43.4 % (ref 39.0–52.0)
Hemoglobin: 13.9 g/dL (ref 13.0–17.0)
Immature Granulocytes: 0 %
Lymphocytes Relative: 36 %
Lymphs Abs: 4.2 10*3/uL — ABNORMAL HIGH (ref 0.7–4.0)
MCH: 33.3 pg (ref 26.0–34.0)
MCHC: 32 g/dL (ref 30.0–36.0)
MCV: 104.1 fL — ABNORMAL HIGH (ref 80.0–100.0)
Monocytes Absolute: 0.6 10*3/uL (ref 0.1–1.0)
Monocytes Relative: 5 %
Neutro Abs: 6.4 10*3/uL (ref 1.7–7.7)
Neutrophils Relative %: 56 %
Platelets: 242 10*3/uL (ref 150–400)
RBC: 4.17 MIL/uL — ABNORMAL LOW (ref 4.22–5.81)
RDW: 13.2 % (ref 11.5–15.5)
WBC: 11.5 10*3/uL — ABNORMAL HIGH (ref 4.0–10.5)
nRBC: 0 % (ref 0.0–0.2)

## 2022-06-03 LAB — URIC ACID: Uric Acid, Serum: 7.8 mg/dL (ref 3.7–8.6)

## 2022-06-03 LAB — HEPATITIS C ANTIBODY: HCV Ab: NONREACTIVE

## 2022-06-03 LAB — HEPATITIS B SURFACE ANTIBODY,QUALITATIVE: Hep B S Ab: NONREACTIVE

## 2022-06-03 LAB — LACTATE DEHYDROGENASE: LDH: 164 U/L (ref 98–192)

## 2022-06-03 LAB — HIV ANTIBODY (ROUTINE TESTING W REFLEX): HIV Screen 4th Generation wRfx: NONREACTIVE

## 2022-06-03 LAB — HEPATITIS B SURFACE ANTIGEN: Hepatitis B Surface Ag: NONREACTIVE

## 2022-06-03 LAB — HEPATITIS B CORE ANTIBODY, TOTAL: Hep B Core Total Ab: NONREACTIVE

## 2022-06-03 LAB — MAGNESIUM: Magnesium: 2.1 mg/dL (ref 1.7–2.4)

## 2022-06-03 NOTE — Progress Notes (Signed)
[facility] NOTE  Patient Care Team: [name], Fransisca Kaufmann, MD as PCP - General (Family Medicine) Minus Breeding, MD as PCP - Cardiology (Cardiology) Irine Seal, MD as Attending Physician (Urology) Minus Breeding, MD as Consulting Physician (Cardiology) Rogene Houston, MD as Consulting Physician (Gastroenterology) Tonny Branch, MD as Consulting Physician (Ophthalmology) [name], MD as Consulting Physician (Orthopedic Surgery) Shea Evans, Norva Riffle, LCSW as [place] Management (Licensed Clinical Social Worker) Derek Jack, MD as Medical Oncologist ([place]) Brien Mates, RN as Oncology Nurse Navigator (Medical Oncology)  CHIEF COMPLAINTS/PURPOSE OF CONSULTATION:  Right supraclavicular lymphadenopathy and multiple lung nodules.  HISTORY OF PRESENTING ILLNESS:  [name] 88 y.o. male is seen in consultation today at the request of Dr. [name] Parisian for right supraclavicular lymphadenopathy and multiple lung nodules.  A CT soft tissue neck showed a right supraclavicular lymph node mass measuring 5.7 x 3.7 cm with some other lymph nodes on both sides of the neck.  No head and neck primary was noted.  CT CAP on 05/28/2022 again showed nodal tissue mass roughly 6.3 x 3.7 cm.  No significant left supraclavicular adenopathy.  Mildly prominent right axillary lymph nodes and slightly enlarged right subpectoralis lymph node.  No significant mediastinal or hilar adenopathy.  Several lung nodules bilaterally, largest measuring 1.8 x 1.3 cm RUL.  He reports that he had noticed this mass around Christmas time in 2022.  He reports 10 to 15 pound weight loss since January.  He reports fatigue.  He was evaluated by Dr. [name] Parisian and CT scans were ordered as described above.  Denies any dysphagia or odynophagia.  Colonoscopy was long time ago.  Lives at home with his wife and is able to do ADLs and IADLs.  Retired from Paediatric nurse work  for 40 years.  2 of his brothers had lung cancers.  He is an ex-smoker who quit smoking in 1965.  MEDICAL HISTORY:  Past Medical History:  Diagnosis Date   Anemia    Atrial fibrillation (HCC)    Cataract    Cellulitis    In past   Complication of anesthesia    HARD TIME WAKING; CAUSES MY BP TO GO UP    Coronary artery disease    5 bypasses   DJD (degenerative joint disease)    Ejection fraction < 50%    Mildly reduced, 40% by echo   GERD (gastroesophageal reflux disease)    Hyperlipidemia    Hypertension    Persistent atrial fibrillation ([place])    Prostate hypertrophy    on CT scan 09/2014   PUD (peptic ulcer disease)    RESOLVED    Skin cancer of eyelid    Resected    SURGICAL HISTORY: Past Surgical History:  Procedure Laterality Date   APPENDECTOMY     BYPASS GRAFT  2005   CATARACT EXTRACTION W/PHACO Left 07/22/2015   Procedure: CATARACT EXTRACTION PHACO AND INTRAOCULAR LENS PLACEMENT LEFT EYE CDE=8.14;  Surgeon: Tonny Branch, MD;  Location: [facility];  Service: Ophthalmology;  Laterality: Left;   CATARACT EXTRACTION W/PHACO Right 08/18/2019   Procedure: CATARACT EXTRACTION PHACO AND INTRAOCULAR LENS PLACEMENT (IOC);  Surgeon: Baruch Goldmann, MD;  Location: [facility];  Service: Ophthalmology;  Laterality: Right;  CDE: 9.74   CORONARY ARTERY BYPASS GRAFT  4/05   LIMA to LAD, SVG to PDA, SVG to ramus intermediate   EYE SURGERY  07/2015   EYELID CARCINOMA EXCISION     Skin cancer resected  Heart bypass  2005   TOTAL HIP ARTHROPLASTY     Right   TOTAL HIP ARTHROPLASTY Left 05/04/2018   Procedure: LEFT TOTAL HIP ARTHROPLASTY;  Surgeon: [name], MD;  Location: [facility];  Service: Orthopedics;  Laterality: Left;   TOTAL KNEE ARTHROPLASTY     Right    SOCIAL HISTORY: Social History   Socioeconomic History   Marital status: Married    Spouse name: [name]   Number of children: 1   Years of education: 10   Highest education level: 10th grade  Occupational History    Occupation: retired  Tobacco Use   Smoking status: Former    Packs/day: 2.00    Years: 5.00    Total pack years: 10.00    Types: Cigarettes    Quit date: 12/23/1963    Years since quitting: 58.4   Smokeless tobacco: Never  Vaping Use   Vaping Use: Never used  Substance and Sexual Activity   Alcohol use: No   Drug use: No   Sexual activity: Yes  Other Topics Concern   Not on file  Social History Narrative   Lives in a split level home.   Social Determinants of Health   Financial Resource Strain: Low Risk  (12/11/2021)   Overall Financial Resource Strain (CARDIA)    Difficulty of Paying Living Expenses: Not hard at all  Food Insecurity: No Food Insecurity (12/11/2021)   Hunger Vital Sign    Worried About Running Out of Food in the Last Year: Never true    Ran Out of Food in the Last Year: Never true  Transportation Needs: No Transportation Needs (12/11/2021)   PRAPARE - Hydrologist (Medical): No    Lack of Transportation (Non-Medical): No  Physical Activity: Insufficiently Active (12/11/2021)   Exercise Vital Sign    Days of Exercise per Week: 7 days    Minutes of Exercise per Session: 20 min  Stress: No Stress Concern Present (12/11/2021)   [place]    Feeling of Stress : Only a little  Recent Concern: Stress - Stress Concern Present (10/31/2021)   [place]    Feeling of Stress : To some extent  Social Connections: Moderately Isolated (12/11/2021)   Social Connection and Isolation Panel [NHANES]    Frequency of Communication with Friends and Family: More than three times a week    Frequency of Social Gatherings with Friends and Family: More than three times a week    Attends Religious Services: Never    Marine scientist or Organizations: No    Attends Archivist Meetings: Never    Marital Status:  Married  Human resources officer Violence: Not At Risk (12/11/2021)   Humiliation, Afraid, Rape, and Kick questionnaire    Fear of Current or Ex-Partner: No    Emotionally Abused: No    Physically Abused: No    Sexually Abused: No    FAMILY HISTORY: Family History  Problem Relation Age of Onset   Stroke Mother    Stroke Father    Hypertension Father    Early death Brother        76 months old   Cancer Brother        lung   Stroke Brother        heat    ALLERGIES:  is allergic to penicillins, hct [hydrochlorothiazide], and statins.  MEDICATIONS:  Current Outpatient Medications  Medication Sig Dispense Refill   acetaminophen (TYLENOL) 500 MG tablet Take 1,000 mg by mouth every 6 (six) hours as needed for moderate pain.     azelastine (ASTELIN) 0.1 % nasal spray Place 1 spray into both nostrils 2 (two) times daily. Use in each nostril as directed 30 mL 12   cetirizine-pseudoephedrine (ZYRTEC-D) 5-120 MG tablet Take once per day as need for congestion/runny nose 10 tablet 0   Cholecalciferol (VITAMIN D3) 5000 units CAPS Take 5,000 Units by mouth once a week.      diltiazem (CARDIZEM CD) 120 MG 24 hr capsule Take one capsule daily 90 capsule 3   ezetimibe (ZETIA) 10 MG tablet Take 1 tablet (10 mg total) by mouth daily. 90 tablet 3   finasteride (PROSCAR) 5 MG tablet Take one tablet each day 90 tablet 3   fluticasone (FLONASE) 50 MCG/ACT nasal spray Place 1 spray into both nostrils daily as needed for allergies or rhinitis.     furosemide (LASIX) 20 MG tablet Take 1 tablet (20 mg total) by mouth daily. 90 tablet 3   lovastatin (MEVACOR) 20 MG tablet TAKE 1/2 TABLET EVERY OTHER DAY 23 tablet 3   metoprolol tartrate (LOPRESSOR) 100 MG tablet Take 1 tablet (100 mg total) by mouth 2 (two) times daily. 180 tablet 3   montelukast (SINGULAIR) 10 MG tablet Take 1 tablet (10 mg total) by mouth at bedtime. 30 tablet 3   Polyethyl Glycol-Propyl Glycol (SYSTANE OP) Place 1 drop into both eyes 3 (three)  times daily as needed (dry eyes).      warfarin (COUMADIN) 5 MG tablet TAKE 1 TABLET ON SAT, SUN, TUES, & THURSTAKE 1/2 TABLET ON MON, WED, & FRIDAY 70 tablet 3   No current facility-administered medications for this visit.    REVIEW OF SYSTEMS:   Constitutional: Denies fevers, chills or abnormal night sweats Eyes: Denies blurriness of vision, double vision or watery eyes Ears, nose, mouth, throat, and face: Denies mucositis or sore throat Respiratory: Denies any cough or wheezing.  Positive for dyspnea. Cardiovascular: Denies palpitation, chest discomfort or lower extremity swelling Gastrointestinal:  Denies nausea, heartburn or change in bowel habits Skin: Denies abnormal skin rashes Lymphatics: Denies new lymphadenopathy or easy bruising Neurological:Denies numbness, tingling or new weaknesses.  Positive for dizziness. Behavioral/Psych: Mood is stable, no new changes  All other systems were reviewed with the patient and are negative.  PHYSICAL EXAMINATION: ECOG PERFORMANCE STATUS: 1 - Symptomatic but completely ambulatory  Vitals:   06/03/22 0821  BP: 127/85  Pulse: (!) 103  Resp: 18  Temp: 97.6 F (36.4 C)  SpO2: 97%   Filed Weights   06/03/22 0821  Weight: 182 lb 3.2 oz (82.6 kg)    GENERAL:alert, no distress and comfortable SKIN: skin color, texture, turgor are normal, no rashes or significant lesions EYES: normal, conjunctiva are pink and non-injected, sclera clear OROPHARYNX:no exudate, no erythema and lips, buccal mucosa, and tongue normal  NECK: supple, thyroid normal size, non-tender, without nodularity LYMPH: Right supraclavicular lymphadenopathy.  Right axillary lymphadenopathy palpable.  No other palpable lymph nodes or splenomegaly. LUNGS: clear to auscultation and percussion with normal breathing effort HEART: regular rate & rhythm and no murmurs and no lower extremity edema ABDOMEN:abdomen soft, non-tender and normal bowel sounds Musculoskeletal:no  cyanosis of digits and no clubbing  PSYCH: alert & oriented x 3 with fluent speech NEURO: no focal motor/sensory deficits  LABORATORY DATA:  I have reviewed the data as listed Lab Results  Component Value Date  WBC 11.5 (H) 06/03/2022   HGB 13.9 06/03/2022   HCT 43.4 06/03/2022   MCV 104.1 (H) 06/03/2022   PLT 242 06/03/2022     Chemistry      Component Value Date/Time   NA 142 06/03/2022 1039   NA 142 05/22/2022 0955   K 4.2 06/03/2022 1039   CL 106 06/03/2022 1039   CO2 28 06/03/2022 1039   BUN 13 06/03/2022 1039   BUN 13 05/22/2022 0955   CREATININE 1.05 06/03/2022 1039   CREATININE 1.10 05/08/2013 0938      Component Value Date/Time   CALCIUM 9.2 06/03/2022 1039   ALKPHOS 68 06/03/2022 1039   AST 26 06/03/2022 1039   ALT 18 06/03/2022 1039   BILITOT 1.1 06/03/2022 1039   BILITOT 0.6 05/22/2022 0955       RADIOGRAPHIC STUDIES: I have personally reviewed the radiological images as listed and agreed with the findings in the report. CT CHEST ABDOMEN PELVIS W CONTRAST  Result Date: 05/28/2022 CLINICAL DATA:  Metastatic disease evaluation. Follow-up abnormal neck CT. EXAM: CT CHEST, ABDOMEN, AND PELVIS WITH CONTRAST TECHNIQUE: Multidetector CT imaging of the chest, abdomen and pelvis was performed following the standard protocol during bolus administration of intravenous contrast. RADIATION DOSE REDUCTION: This exam was performed according to the departmental dose-optimization program which includes automated exposure control, adjustment of the mA and/or kV according to patient size and/or use of iterative reconstruction technique. CONTRAST:  192mL OMNIPAQUE IOHEXOL 300 MG/ML  SOLN COMPARISON:  Neck CT 05/27/2022 and CT abdomen 09/11/2014 FINDINGS: CT CHEST FINDINGS Cardiovascular: Post CABG changes. Atherosclerotic calcifications in thoracic aorta without aneurysm. Native coronary arteries are calcified. Heart size is slightly enlarged without pericardial effusion.  Mediastinum/Nodes: Again noted is bulky lymphadenopathy in the right supraclavicular region. Nodal tissue measures roughly 6.3 x 3.7 cm on sequence 2 image 3. No significant left supraclavicular lymphadenopathy. Mildly prominent right axillary lymph nodes and slightly enlarged right sub pectoralis lymph node on sequence 2 image 12. In addition, there is subcutaneous edema or soft tissue thickening superficial to the right supraclavicular nodal mass. There is no significant mediastinal or hilar lymph node enlargement. Lungs/Pleura: Several bilateral pulmonary nodules. Index pulmonary nodule in the right upper lobe measures 1.8 x 1.3 cm on sequence 3 image 54. Small clusters of nodules in the medial left upper lobe on sequence 3, images 35 and 38. Irregular nodular density along the left side of the mediastinum measures up to 2.1 cm on image 39. No large pleural effusions. Musculoskeletal: No acute bone abnormality in the chest. No suspicious osseous lesions. CT ABDOMEN PELVIS FINDINGS Hepatobiliary: Multiple calcified gallstones without inflammatory changes. Subtle 0.8 cm low-density structure in the right hepatic lobe on sequence 2 image 51 was probably present in 2015 and suspect this is an involuted cyst. No suspicious hepatic lesions. Liver has a slightly nodular contour and indeterminate. No biliary dilatation. Subtle small hypodensity in the right hepatic lobe on sequence 8 image 12 is chronic since 2015. Pancreas: Unremarkable. No pancreatic ductal dilatation or surrounding inflammatory changes. Spleen: Normal in size without focal abnormality. Adrenals/Urinary Tract: Normal adrenal glands. Low-density structures in both kidneys are most compatible with simple cysts and do not require dedicated follow-up. No suspicious renal lesions. No hydronephrosis. Normal appearance of the urinary bladder. Stomach/Bowel: Small to moderate sized hiatal hernia. Question a small lipoma near the duodenal bulb/distal stomach on  sequence 2 image 63. No bowel dilatation and no evidence for bowel obstruction. Colonic diverticula without acute bowel inflammation. Vascular/Lymphatic:  Diffuse atherosclerotic calcifications in the abdominal aorta without aneurysm, dissection or significant stenosis. Calcified plaque at the origin of the celiac trunk causing at least mild stenosis. At least mild stenosis in the proximal SMA from calcified plaque. Calcified plaque involving bilateral renal arteries with a least mild stenosis. Most severe stenosis may be in the mid left renal artery. IMA is patent. No lymph node enlargement in the abdomen or pelvis. Reproductive: Prostate appears to be enlarged but limited evaluation due to hip arthroplasty artifact. Other: Negative for ascites. Left inguinal hernias containing fat, left side is larger than right. Negative for free air. Musculoskeletal: Bilateral hip arthroplasties are located. No suspicious osseous lesions. IMPRESSION: 1. Large nodal mass in the right supraclavicular region with bilateral pulmonary nodules. In addition, slightly enlarged lymph nodes in the right axilla and right sub pectoralis region. Findings are suggestive for a neoplastic process with metastatic disease. Primary neoplastic source may be located in the right supraclavicular region but etiology is uncertain based on imaging. The right supraclavicular nodal mass is amendable to ultrasound-guided biopsy. 2. No evidence for metastatic disease in the abdomen or pelvis. 3. Cholelithiasis without acute inflammation. 4. Small to moderate sized hiatal hernia. 5. Bilateral renal cysts.  No suspicious renal lesions. 6. Aortic Atherosclerosis (ICD10-I70.0). Areas of stenosis involving visceral arteries as described. 7. Liver has a slightly nodular contour. Findings are indeterminate but difficult to exclude mild cirrhotic changes. These results will be called to the ordering clinician or representative by the Radiologist Assistant, and  communication documented in the PACS or [employer]. Electronically Signed   By: Markus Daft M.D.   On: 05/28/2022 15:18   CT Soft Tissue Neck W Contrast  Result Date: 05/27/2022 CLINICAL DATA:  Right neck mass EXAM: CT NECK WITH CONTRAST TECHNIQUE: Multidetector CT imaging of the neck was performed using the standard protocol following the bolus administration of intravenous contrast. RADIATION DOSE REDUCTION: This exam was performed according to the departmental dose-optimization program which includes automated exposure control, adjustment of the mA and/or kV according to patient size and/or use of iterative reconstruction technique. CONTRAST:  48mL OMNIPAQUE IOHEXOL 300 MG/ML  SOLN COMPARISON:  None Available. FINDINGS: Pharynx and larynx: Normal. No mass or swelling. Salivary glands: No inflammation, mass, or stone. Thyroid: Negative Lymph nodes: Numerous pathologic lymph nodes in the right neck. Right supraclavicular lymph node mass measures 5.7 x 3.7 cm. 18 mm posterior lymph node in the right lower neck. Numerous additional lymph nodes in the right lower and posterior neck measuring approximately 1 cm. Many of these have internal necrosis and are consistent with metastatic disease. 20 mm lymph node just above the right clavicle also pathologic and likely readily palpable Left level 2 lymph node just above the hyoid bone measures 12 mm with central necrosis. This likely is due to metastatic disease. No other pathologic nodes in the left neck. Vascular: Atherosclerotic calcification with carotid stenosis bilaterally left greater than right. Normal jugular enhancement. Diffusely disease right vertebral artery. Limited intracranial: Negative Visualized orbits: Bilateral cataract extraction Mastoids and visualized paranasal sinuses: Mucosal edema and air-fluid level right maxillary sinus. Mild mucosal edema throughout the remainder of the paranasal sinuses. Mastoid and middle ear clear bilaterally.  Skeleton: Cervical spondylosis C5-6 and C6-7. No fracture or skeletal metastasis. Upper chest: Multiple lung nodules in the apices with appearance of metastatic disease. Left upper lobe medial lesion measuring approximately 18 mm in maximal dimension. Left upper lobe anterior lesion 12 mm. Right upper lobe lesions measuring 14 mm,  8 mm, 18 mm in diameter. No pleural effusion. Atherosclerotic aortic arch. Other: None IMPRESSION: Large lymph node mass in the right supraclavicular area compatible with metastatic disease. Solitary pathologic lymph node in the left neck also highly suspicious for metastatic disease Multiple lung nodule in the a lung apices bilaterally compatible with metastatic disease. Recommend CT chest abdomen pelvis with contrast to further evaluate for primary malignancy. Diffuse atherosclerotic disease with bilateral carotid stenosis and right vertebral stenosis. Electronically Signed   By: Franchot Gallo M.D.   On: 05/27/2022 17:25   DG Chest 2 View  Result Date: 05/22/2022 CLINICAL DATA:  Neck lump in the right supraclavicular region. EXAM: CHEST - 2 VIEW COMPARISON:  May 08, 2022, March 15, 2018 FINDINGS: The heart size and mediastinal contours are stable. There is a questioned 2.1 cm nodule in the lateral right upper lung zone unchanged compared prior exam of May 08, 2022 with new compared to chest x-ray of March 15, 2018. The left lung is stable. The visualized skeletal structures are stable. IMPRESSION: Questioned 2.1 cm nodule in the lateral right upper lung zone unchanged compared prior exam of July 28, 202323 with new compared to chest x-ray of March 15, 2018. Recommend further evaluation with chest CT. Electronically Signed   By: Abelardo Diesel M.D.   On: 05/22/2022 16:21   DG Chest Port 1 View  Result Date: 05/08/2022 CLINICAL DATA:  Fever on off for 2 weeks EXAM: PORTABLE CHEST 1 VIEW COMPARISON:  01/15/2022 FINDINGS: Bilateral mild chronic interstitial thickening. No focal  consolidation. No pleural effusion or pneumothorax. Heart and mediastinal contours are unremarkable. Prior CABG. Thoracic aortic atherosclerosis. No acute osseous abnormality. IMPRESSION: 1. No acute cardiopulmonary disease. Electronically Signed   By: Kathreen Devoid M.D.   On: 05/08/2022 14:59    ASSESSMENT:  1.  Right supraclavicular lymphadenopathy and multiple lung nodules: - Patient noticed right neck mass since Christmas 2022.  10 to 15 pound weight loss since January 2023.  Denies any dysphagia or odynophagia. - CT neck (05/27/2022): Numerous lymph nodes in the right neck, right supraclavicular lymph node mass measures 5.7 x 3.7 cm.  18 mm posterior lymph node in the right lower neck.  Numerous lymph nodes in the right lower and posterior neck approximately 1 cm.  Many have internal necrosis consistent with metastatic disease.  20 mm lymph node just above the right clavicle.  Left level 2 node 12 mm. - CT CAP (05/28/2022): Several bilateral lung nodules, RUL nodule measuring 1.8 x 1.3 cm.  Small clusters of nodules in the medial left upper lobe.  Irregular nodular density along the left side of the mediastinum measuring 2.1 cm.  No metastatic disease in the abdomen or pelvis.  Liver is slightly nodular contour. - Lab work shows elevated white count since 2009, predominantly lymphocytosis.  2.  Social/family history: - He lives at home with his wife.  Independent of ADLs and IADLs.  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  May have had asbestos exposure 30 years ago.  Quit smoking cigarettes in 1965.  Smoked 1 pack/day for less than 12 years.  2 of his brothers had lung cancer and both were smokers.  PLAN:  1.  Right supraclavicular lymphadenopathy and multiple lung nodules: - I have discussed and reviewed images of the CT scans with the patient and his wife in detail.  No clear primary on the scans. - I have reviewed his labs from 2009, CBC  showing  leukocytosis, predominantly lymphocytosis. - He had 10 to 15 pound weight loss since January. - Based on the blood counts and CT scans, lymphoma is highly likely. - We will check LDH, uric acid, hepatitis B and C serology and HIV. - Recommend PET scan and brain MRI. - He is seeing Dr. Constance Haw tomorrow for incision/excision biopsy of the right supraclavicular lymph node.  We will also ask for port placement. - RTC 1 week after biopsy.   Orders Placed This Encounter  Procedures   NM PET Image Initial (PI) Skull Base To Thigh    Standing Status:   Future    Standing Expiration Date:   06/03/2023    Order Specific Question:   If indicated for the ordered procedure, I authorize the administration of a radiopharmaceutical per Radiology protocol    Answer:   Yes    Order Specific Question:   Preferred imaging location?    Answer:   [facility]    Order Specific Question:   Release to patient    Answer:   Immediate   MR Brain W Wo Contrast    Standing Status:   Future    Standing Expiration Date:   06/03/2023    Order Specific Question:   If indicated for the ordered procedure, I authorize the administration of contrast media per Radiology protocol    Answer:   Yes    Order Specific Question:   What is the patient's sedation requirement?    Answer:   No Sedation    Order Specific Question:   Does the patient have a pacemaker or implanted devices?    Answer:   No    Order Specific Question:   Use SRS Protocol?    Answer:   No    Order Specific Question:   Call Results- Best Contact Number?    Answer:   no call number / do not hold patient    Order Specific Question:   Preferred imaging location?    Answer:   [facility] (table limit [phone])    Order Specific Question:   Release to patient    Answer:   Immediate   CBC with Differential    Standing Status:   Future    Number of Occurrences:   1    Standing Expiration Date:   06/03/2023   Comprehensive metabolic panel    Standing  Status:   Future    Number of Occurrences:   1    Standing Expiration Date:   06/03/2023   Magnesium    Standing Status:   Future    Number of Occurrences:   1    Standing Expiration Date:   06/03/2023   Lactate dehydrogenase    Standing Status:   Future    Number of Occurrences:   1    Standing Expiration Date:   06/03/2023   Beta 2 microglobuline, serum    Standing Status:   Future    Number of Occurrences:   1    Standing Expiration Date:   06/03/2023   CEA    Standing Status:   Future    Number of Occurrences:   1    Standing Expiration Date:   06/03/2023   Cancer antigen 19-9    Standing Status:   Future    Number of Occurrences:   1    Standing Expiration Date:   06/03/2023   HCG, tumor marker    Standing Status:   Future  Number of Occurrences:   1    Standing Expiration Date:   06/03/2023   AFP tumor marker    Standing Status:   Future    Number of Occurrences:   1    Standing Expiration Date:   06/03/2023   Uric acid    Standing Status:   Future    Number of Occurrences:   1    Standing Expiration Date:   06/03/2023   Flow Cytometry, Peripheral Blood (Oncology)    Lymphocyte Subset    Standing Status:   Future    Number of Occurrences:   1    Standing Expiration Date:   06/04/2023   Hepatitis B surface antigen    Standing Status:   Future    Number of Occurrences:   1    Standing Expiration Date:   06/03/2023   Hepatitis B surface antibody    Standing Status:   Future    Number of Occurrences:   1    Standing Expiration Date:   06/03/2023   Hepatitis B core antibody, total    Standing Status:   Future    Number of Occurrences:   1    Standing Expiration Date:   06/04/2023   Hepatitis C Antibody    Standing Status:   Future    Number of Occurrences:   1    Standing Expiration Date:   06/04/2023   HIV antibody (with reflex)    Standing Status:   Future    Number of Occurrences:   1    Standing Expiration Date:   06/04/2023    All questions were answered. The  patient knows to call the clinic with any problems, questions or concerns.      Derek Jack, MD 06/03/2022 1:47 PM

## 2022-06-03 NOTE — Patient Instructions (Addendum)
[place] at [facility] Discharge Instructions  You were seen and examined today by Dr. Delton Coombes. Dr. Delton Coombes is a medical oncologist, meaning that he specializes in the treatment of cancer diagnoses. Dr. Delton Coombes discussed your past medical history, family history of cancers, and the events that led to you being here today.  You were referred to Dr. 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 This is a very treatable condition, but it normally treated with chemotherapy.   Dr. Delton Coombes has recommended a few things prior to starting any treatment. Dr. Delton Coombes has recommended additional labs today as well as a biopsy to be done by Dr. Constance Haw. You will also need a PET scan and a brain MRI to complete the staging work-up.  Follow-up with Dr. Delton Coombes approximately 1 week after the biopsy.  Thank you for choosing [place] at [facility] to provide your oncology and hematology care.  To afford each patient quality time with our provider, please arrive at least 15 minutes before your scheduled appointment time.   If you have a lab appointment with the [facility] please come in thru the Main Entrance and check in at the main information desk.  You need to re-schedule your appointment should you arrive 10 or more minutes late.  We strive to give you quality time with our providers, and arriving late affects you and other patients whose appointments are after yours.  Also, if you no show three or more times for appointments you may be dismissed from the clinic at the providers discretion.     Again, thank you for choosing [facility].  Our hope is that these requests will decrease the amount of time that you wait before being seen by our physicians.       _____________________________________________________________  Should you have questions after your visit to  [facility], please contact our office at [phone] and follow the prompts.  Our office hours are 8:00 a.m. and 4:30 p.m. Monday - Friday.  Please note that voicemails left after 4:00 p.m. may not be returned until the following business day.  We are closed weekends and major holidays.  You do have access to a nurse 24-7, just call the main number to the clinic [phone] and do not press any options, hold on the line and a nurse will answer the phone.    For prescription refill requests, have your pharmacy contact our office and allow 72 hours.

## 2022-06-04 ENCOUNTER — Ambulatory Visit: Payer: Medicare Other | Admitting: General Surgery

## 2022-06-04 ENCOUNTER — Encounter: Payer: Self-pay | Admitting: General Surgery

## 2022-06-04 VITALS — BP 155/91 | HR 77 | Temp 98.3°F | Resp 14 | Ht 66.0 in | Wt 182.0 lb

## 2022-06-04 DIAGNOSIS — R590 Localized enlarged lymph nodes: Secondary | ICD-10-CM

## 2022-06-04 LAB — SURGICAL PATHOLOGY

## 2022-06-04 LAB — BETA HCG QUANT (REF LAB): hCG Quant: <1 m[IU]/mL (ref 0–3)

## 2022-06-04 LAB — CANCER ANTIGEN 19-9: CA 19-9: 78 U/mL — ABNORMAL HIGH (ref 0–35)

## 2022-06-04 LAB — BETA 2 MICROGLOBULIN, SERUM: Beta-2 Microglobulin: 2.7 mg/L — ABNORMAL HIGH (ref 0.6–2.4)

## 2022-06-04 LAB — CEA: CEA: 5.4 ng/mL — ABNORMAL HIGH (ref 0.0–4.7)

## 2022-06-04 LAB — AFP TUMOR MARKER: AFP, Serum, Tumor Marker: 3.2 ng/mL (ref 0.0–6.4)

## 2022-06-04 NOTE — Patient Instructions (Addendum)
Hold your coumadin until after our surgery on Tuesday.   Open Lymph Node Biopsy An open lymph node biopsy is a procedure to remove a lymph node so that it can be checked for disease. Lymph nodes are part of the body's disease-fighting system (immune system). The immune system protects the body from infections, germs, and diseases. An open lymph node biopsy may be done to: Look for germs or cancer cells in your lymph node. Find out why your lymph node is swollen. Find out more about a condition you have. Lymph nodes are found in many locations in the body. Biopsies are often done on lymph nodes in the head, neck, armpit, or groin. Tell a health care provider about: Any allergies you have. All medicines you are taking, including vitamins, herbs, eye drops, creams, and over-the-counter medicines. Any problems you or family members have had with anesthetic medicines. Any blood disorders you have. Any surgeries you have had. Any medical conditions you have or have had. Whether you are pregnant or may be pregnant. What are the risks? Generally, this is a safe procedure. However, problems may occur, including: Infection. Bleeding. Allergic reactions to medicines. Damage to surrounding structures or organs, such as a nerve. Scarring. What happens before the procedure? Medicines Ask your health care provider about: Changing or stopping your regular medicines. This is especially important if you are taking diabetes medicines or blood thinners. Taking medicines such as aspirin and ibuprofen. These medicines can thin your blood. Do not take these medicines before the procedure unless your health care provider tells you to take them. Taking over-the-counter medicines, vitamins, herbs, and supplements. Surgery safety Ask your health care provider: How your surgery site will be marked. What steps will be taken to help prevent infection. These steps may include: Removing hair at the surgery  site. Washing skin with a germ-killing soap. Receiving antibiotic medicine. General instructions Follow instructions from your health care provider about eating or drinking restrictions. You may have an exam or testing. You may have a blood or urine sample taken. If you will be going home right after the procedure, plan to have a responsible adult care for you for the time you are told. This is important. What happens during the procedure?  An IV will be inserted into one of your veins. You will be given one or more of the following: A medicine to help you relax (sedative). A medicine to numb the area (local anesthetic). An incision will be made in the area where your lymph node is located. Your lymph node will be removed. Your incision will be closed with stitches (sutures). An antibiotic ointment may be applied to your incision. A bandage (dressing) will be placed over your incision. The procedure may vary among health care providers and hospitals. What happens after the procedure? Your blood pressure, heart rate, breathing rate, and blood oxygen level will be monitored until you leave the hospital or clinic. Do not drive for 24 hours if you were given a sedative during your procedure. It is up to you to get the results of your procedure. Ask your health care provider, or the department that is doing the procedure, when your results will be ready. Summary An open lymph node biopsy is a procedure to remove a lymph node so that it can be checked for disease. Generally, this is a safe procedure. However, problems may occur, including bleeding, infection, allergic reaction to medicines, and damage to other structures or organs. During the procedure, an incision  will be made in the area of the lymph node, the lymph node will be removed, and the incision will be closed with sutures. You will be monitored after the procedure. Do not drive for 24 hours if you were given a sedative during your  procedure. This information is not intended to replace advice given to you by your health care provider. Make sure you discuss any questions you have with your health care provider. Document Revised: 07/11/2020 Document Reviewed: 07/11/2020 Elsevier Patient Education  [place] Insertion Implanted port insertion is a procedure to put in a port and catheter. The port is a device with an injectable disc that can be accessed by your health care provider. The port is connected to a vein in the chest or neck by a small, thin tube (catheter). There are different types of ports. The implanted port may be used as a long-term IV access for: Medicines, such as chemotherapy. Fluids. Liquid nutrition, such as total parenteral nutrition (TPN). When you have a port, your health care provider can choose to use the port instead of veins in your arms for these procedures. Tell a health care provider about: Any allergies you have. All medicines you are taking, especially blood thinners, as well as any vitamins, herbs, eye drops, creams, over-the-counter medicines, and steroids. Any problems you or family members have had with anesthetic medicines. Any bleeding problems you have. Any surgeries you have had. Any medical conditions you have or have had, including diabetes or kidney problems. Whether you are pregnant or may be pregnant. What are the risks? Generally, this is a safe procedure. However, problems may occur, including: Allergic reactions to medicines or dyes. Damage to other structures or organs. Infection. Damage to the blood vessel, bruising, or bleeding at the puncture site. Blood clot. Breakdown of the skin over the port. A collection of air in the chest that can cause one of the lungs to collapse (pneumothorax). This is rare. What happens before the procedure? If you do not follow your health care provider's instructions, your procedure may be delayed or  canceled. Medicines Ask your health care provider about: Changing or stopping your regular medicines. This is especially important if you are taking diabetes medicines or blood thinners. Taking medicines such as aspirin and ibuprofen. These medicines can thin your blood. Do not take these medicines unless your health care provider tells you to take them. Taking over-the-counter medicines, vitamins, herbs, and supplements. General instructions If you will be going home right after the procedure, plan to have a responsible adult: Take you home from the hospital or clinic. You will not be allowed to drive. Care for you for the time you are told. You may have blood tests. Do not use any products that contain nicotine or tobacco for at least 4 weeks before the procedure. These products include cigarettes, chewing tobacco, and vaping devices, such as e-cigarettes. If you need help quitting, ask your health care provider. Ask your health care provider what steps will be taken to help prevent infection. These may include: Removing hair at the surgery site. Washing skin with a germ-killing soap. Taking antibiotic medicine. What happens during the procedure?  An IV will be inserted into one of your veins. You will be given one or more of the following: A medicine to help you relax (sedative). A medicine to numb the area (local anesthetic). Two small incisions will be made to insert the port. One smaller incision will be made in your  neck to get access to the vein where the catheter will lie. The other incision will be made in the upper chest. This is where the port will lie. The procedure may be done using continuous X-ray (fluoroscopy) or other imaging tools for guidance. The port and catheter will be placed. There may be a small, raised area where the port is placed. The port will be flushed with a saline solution, which is made of salt and water, and blood will be drawn to make sure that the port  is working correctly. The incisions will be closed. Bandages (dressings) may be placed over the incisions. The procedure may vary among health care providers and hospitals. What happens after the procedure? Your blood pressure, heart rate, breathing rate, and blood oxygen level will be monitored until you leave the hospital or clinic. If you were given a sedative during the procedure, it can affect you for several hours. Do not drive or operate machinery until your health care provider says that it is safe. You will be given a manufacturer's information card for the type of port that you have. Keep this with you. Your port will need to be flushed and checked as told by your health care provider, usually every few weeks. A chest X-ray will be done to: Check the placement of the port. Make sure there is no injury to your lung. Summary Implanted port insertion is a procedure to put in a port and catheter. The implanted port is used as a long-term IV access. The port will need to be flushed and checked as told by your health care provider, usually every few weeks. Keep your manufacturer's information card with you at all times. This information is not intended to replace advice given to you by your health care provider. Make sure you discuss any questions you have with your health care provider. Document Revised: 04/01/2021 Document Reviewed: 04/01/2021 Elsevier Patient Education  [place].

## 2022-06-04 NOTE — Progress Notes (Signed)
[facility] History and Physical  Reason for Referral: Supraclavicular adenopathy, concern for lymphoma  Referring Physician:  [name], Fransisca Kaufmann, MD, Dr. Delton Coombes   Chief Complaint   New Patient (Initial Visit)     [name] is a 88 y.o. male.  HPI: [name] is a very sweet 88 yo who comes in with reports of right sided adenopathy above his clavicle. He had a T scan that demonstrated this adenopathy and some lung nodules. He was seen by Dr. Delton Coombes this week and reported to him that he noted the mass on the neck around Christmas 2022. He denies any dysphagia or odynophagia. He does his ADLS and IADLs. He is a retired Horticulturist, commercial.  He had blood studies that demonstrate  concern for lymphoma and Dr. Delton Coombes wants him to proceed with clavicular node biopsy and port placement.   He is on Coumadin for A fib. He had a heart attack in 2018 after a knee surgery and required CABG. He has no recent CP or SOB. He saw Dr. Jenkins Rouge 03/2022 and was stable from a cardiac standpoint.   Past Medical History:  Diagnosis Date   Anemia    Atrial fibrillation (HCC)    Cataract    Cellulitis    In past   Complication of anesthesia    HARD TIME WAKING; CAUSES MY BP TO GO UP    Coronary artery disease    5 bypasses   DJD (degenerative joint disease)    Ejection fraction < 50%    Mildly reduced, 40% by echo   GERD (gastroesophageal reflux disease)    Hyperlipidemia    Hypertension    Persistent atrial fibrillation ([place])    Prostate hypertrophy    on CT scan 09/2014   PUD (peptic ulcer disease)    RESOLVED    Skin cancer of eyelid    Resected    Past Surgical History:  Procedure Laterality Date   APPENDECTOMY     BYPASS GRAFT  2005   CATARACT EXTRACTION W/PHACO Left 07/22/2015   Procedure: CATARACT EXTRACTION PHACO AND INTRAOCULAR LENS PLACEMENT LEFT EYE CDE=8.14;  Surgeon: Tonny Branch, MD;  Location: [facility];  Service: Ophthalmology;  Laterality: Left;   CATARACT  EXTRACTION W/PHACO Right 08/18/2019   Procedure: CATARACT EXTRACTION PHACO AND INTRAOCULAR LENS PLACEMENT (IOC);  Surgeon: Baruch Goldmann, MD;  Location: [facility];  Service: Ophthalmology;  Laterality: Right;  CDE: 9.74   CORONARY ARTERY BYPASS GRAFT  4/05   LIMA to LAD, SVG to PDA, SVG to ramus intermediate   EYE SURGERY  07/2015   EYELID CARCINOMA EXCISION     Skin cancer resected   Heart bypass  2005   TOTAL HIP ARTHROPLASTY     Right   TOTAL HIP ARTHROPLASTY Left 05/04/2018   Procedure: LEFT TOTAL HIP ARTHROPLASTY;  Surgeon: [name], MD;  Location: [facility];  Service: Orthopedics;  Laterality: Left;   TOTAL KNEE ARTHROPLASTY     Right    Family History  Problem Relation Age of Onset   Stroke Mother    Stroke Father    Hypertension Father    Early death Brother        67 months old   Cancer Brother        lung   Stroke Brother        heat    Social History   Tobacco Use   Smoking status: Former    Packs/day: 2.00    Years: 5.00  Total pack years: 10.00    Types: Cigarettes    Quit date: 12/23/1963    Years since quitting: 58.4   Smokeless tobacco: Never  Vaping Use   Vaping Use: Never used  Substance Use Topics   Alcohol use: No   Drug use: No    Medications: I have reviewed the patient's current medications. Allergies as of 06/04/2022       Reactions   Penicillins Hives, Rash   Has patient had a PCN reaction causing immediate rash, facial/tongue/throat swelling, SOB or lightheadedness with hypotension: Yes Has patient had a PCN reaction causing severe rash involving mucus membranes or skin necrosis: Yes Has patient had a PCN reaction that required hospitalization: No Has patient had a PCN reaction occurring within the last 10 years: No If all of the above answers are "NO", then may proceed with Cephalosporin use.   Hct [hydrochlorothiazide] Other (See Comments)   hyper   Statins Other (See Comments)   Myalgia, tolerates low dosages of lovastatin          Medication List        Accurate as of June 04, 2022  4:07 PM. If you have any questions, ask your nurse or doctor.          STOP taking these medications    montelukast 10 MG tablet Commonly known as: SINGULAIR Stopped by: Virl Cagey, MD       TAKE these medications    acetaminophen 500 MG tablet Commonly known as: TYLENOL Take 1,000 mg by mouth every 6 (six) hours as needed for moderate pain.   azelastine 0.1 % nasal spray Commonly known as: ASTELIN Place 1 spray into both nostrils 2 (two) times daily. Use in each nostril as directed   cetirizine-pseudoephedrine 5-120 MG tablet Commonly known as: ZYRTEC-D Take once per day as need for congestion/runny nose   diltiazem 120 MG 24 hr capsule Commonly known as: CARDIZEM CD Take one capsule daily   ezetimibe 10 MG tablet Commonly known as: ZETIA Take 1 tablet (10 mg total) by mouth daily.   finasteride 5 MG tablet Commonly known as: PROSCAR Take one tablet each day   fluticasone 50 MCG/ACT nasal spray Commonly known as: FLONASE Place 1 spray into both nostrils daily as needed for allergies or rhinitis.   furosemide 20 MG tablet Commonly known as: LASIX Take 1 tablet (20 mg total) by mouth daily.   lovastatin 20 MG tablet Commonly known as: MEVACOR TAKE 1/2 TABLET EVERY OTHER DAY   metoprolol tartrate 100 MG tablet Commonly known as: LOPRESSOR Take 1 tablet (100 mg total) by mouth 2 (two) times daily.   SYSTANE OP Place 1 drop into both eyes 3 (three) times daily as needed (dry eyes).   Vitamin D3 125 MCG (5000 UT) Caps Take 5,000 Units by mouth once a week.   warfarin 5 MG tablet Commonly known as: COUMADIN Take as directed by the anticoagulation clinic. If you are unsure how to take this medication, talk to your nurse or doctor. Original instructions: TAKE 1 TABLET ON SAT, SUN, TUES, & THURSTAKE 1/2 TABLET ON MON, WED, & FRIDAY         ROS:  A comprehensive review of systems  was negative except for: Respiratory: positive for SOB Genitourinary: positive for frequency Hematologic/lymphatic: positive for lymphadenopathy Musculoskeletal: positive for neck pain and joint pain  Blood pressure (!) 155/91, pulse 77, temperature 98.3 F (36.8 C), temperature source Oral, resp. rate 14, height 5' 6"  (1.676  m), weight 182 lb (82.6 kg), SpO2 93 %. Physical Exam Vitals reviewed.  Constitutional:      Appearance: Normal appearance.  HENT:     Head: Normocephalic.     Nose: Nose normal.  Eyes:     Extraocular Movements: Extraocular movements intact.  Cardiovascular:     Rate and Rhythm: Normal rate.  Pulmonary:     Effort: Pulmonary effort is normal.  Abdominal:     General: There is no distension.     Palpations: Abdomen is soft.     Tenderness: There is no abdominal tenderness.  Musculoskeletal:        General: Normal range of motion.  Lymphadenopathy:     Cervical: Cervical adenopathy present.     Right cervical: Superficial cervical adenopathy present.     Left cervical: Superficial cervical adenopathy present.     Upper Body:     Right upper body: Supraclavicular adenopathy present.  Skin:    General: Skin is warm.  Neurological:     General: No focal deficit present.     Mental Status: He is alert.  Psychiatric:        Mood and Affect: Mood normal.        Thought Content: Thought content normal.        Judgment: Judgment normal.     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  Note: Flow cytometry identifies a lambda restricted B-cell population coexpressing CD5 and CD200 consistent with a B-cell lymphoproliferative disorder with a CLL immunophenotype.  Given the overall absolute number this is best currently described as a monoclonal B-cell lymphocytosis; however, it is noted that there is supraclavicular adenopathy. Therefore this could represent a small lymphocytic lymphoma.  A lymph node biopsy may be necessary to fully exclude small lymphocytic lymphoma.  It should be noted that on the smear review while the white blood cells including lymphocytes and red blood cells are relatively unremarkable, there is a significant amo unt of platelet clumping which could aberrantly decreased the overall platelet count and a repeat platelet count with a citrate tube could be considered.  GATING AND PHENOTYPIC ANALYSIS:  Gated population: Flow cytometric immunophenotyping is performed using antibodies to the antigens listed in the table below. Electronic gates are placed around a cell cluster displaying light scatter properties corresponding to: lymphocytes  Abnormal Cells in gated population: 24%  Phenotype of Abnormal Cells: CD5, CD19, CD20, CD200, Lambda                        Lymphoid Antigens       Myeloid Antigens Miscellaneous CD2  NEG  CD10 NEG  CD11b     ND   CD45 POS CD3  NEG  CD19 POS  CD11c     ND   HLA-Dr    ND CD4  NEG  CD20 POS  CD13 ND   CD34 NEG CD5  POS  CD22 ND   CD14 ND  CD38 NEG CD7  NEG  CD79b     ND   CD15 ND   CD138     ND CD8  NEG  CD103     ND   CD16 ND   TdT  ND CD25 ND   CD200     POS  CD33 ND   CD123     ND TCRab     ND   sKappa    NEG  CD64 ND   CD41 ND TCRgd      NEG  sLambda   POS  CD117     ND   CD61 ND CD56 NEG  cKappa    ND   MPO  ND   CD71 ND CD57 ND   cLambda   ND        [id number]      GROSS DESCRIPTION:  One lavender top tube submitted from [facility] at [facility] for  lymphoma testing.    Final Diagnosis performed by Tilford Pillar DO.   Electronically signed 06/04/2022 Technical and / or Professional components performed at [facility], [place] [address], [place], [place] [address].  The above tests were developed and their performance characteristics determined by the [facility] system for the physical and immunophenotypic characterization of cell populations. They have not been cleared by the U.S. Food and Drug administration. The  FDA has determined that such clearance or approval is not necessary. This test is used for clinical purposes. It should not be  regarded as investigational or for research     Personally reviewed- large supraclavicular node that extends below the clavicle, more superficial node adjacent,   CLINICAL DATA:  Right neck mass   EXAM: CT NECK WITH CONTRAST   TECHNIQUE: Multidetector CT imaging of the neck was performed using the standard protocol following the bolus administration of intravenous contrast.   RADIATION DOSE REDUCTION: This exam was performed according to the departmental dose-optimization program which includes automated exposure control, adjustment of the mA and/or kV according to patient size and/or use of iterative reconstruction technique.   CONTRAST:  67m OMNIPAQUE IOHEXOL 300 MG/ML  SOLN   COMPARISON:  None Available.   FINDINGS: Pharynx and larynx: Normal. No mass or swelling.   Salivary glands: No inflammation, mass, or stone.   Thyroid: Negative   Lymph nodes: Numerous pathologic lymph nodes in the right neck. Right supraclavicular lymph node mass measures 5.7 x 3.7 cm. 18 mm posterior lymph node in the right lower neck. Numerous additional lymph nodes in the right lower and posterior neck measuring approximately 1 cm. Many of these have internal necrosis and are consistent with metastatic disease. 20 mm lymph node just above the right clavicle also pathologic  and likely readily palpable   Left level 2 lymph node just above the hyoid bone measures 12 mm with central necrosis. This likely is due to metastatic disease. No other pathologic nodes in the left neck.   Vascular: Atherosclerotic calcification with carotid stenosis bilaterally left greater than right. Normal jugular enhancement. Diffusely disease right vertebral artery.   Limited intracranial: Negative   Visualized orbits: Bilateral cataract extraction   Mastoids and visualized paranasal sinuses: Mucosal edema and air-fluid level right maxillary sinus. Mild mucosal edema throughout the remainder of the paranasal sinuses. Mastoid and middle ear clear bilaterally.   Skeleton: Cervical spondylosis C5-6 and C6-7. No fracture or skeletal metastasis.   Upper chest: Multiple lung nodules in the apices with appearance of metastatic disease. Left upper lobe medial  lesion measuring approximately 18 mm in maximal dimension. Left upper lobe anterior lesion 12 mm. Right upper lobe lesions measuring 14 mm, 8 mm, 18 mm in diameter. No pleural effusion. Atherosclerotic aortic arch.   Other: None   IMPRESSION: Large lymph node mass in the right supraclavicular area compatible with metastatic disease.   Solitary pathologic lymph node in the left neck also highly suspicious for metastatic disease   Multiple lung nodule in the a lung apices bilaterally compatible with metastatic disease. Recommend CT chest abdomen pelvis with contrast to further evaluate for primary malignancy.   Diffuse atherosclerotic disease with bilateral carotid stenosis and right vertebral stenosis.     Electronically Signed   By: Franchot Gallo M.D.   On: 05/27/2022 17:25   Assessment & Plan:  [name] is a 88 y.o. male with adenopathy and concern for possible lymphoma versus other cancer. He needs a biopsy and a port. Discussed taking the superficial node on the right and getting a portio not the deeper  node. Discussed risk of bleeding, infection, nondiagnostic results. Discussed port placement on the other side and risk of bleeding,infection, injury to vessels and pneumothorax.   Hold Coumadin starting today for Tuesday OR    All questions were answered to the satisfaction of the patient and family.    Virl Cagey 06/04/2022, 4:07 PM

## 2022-06-05 ENCOUNTER — Encounter (HOSPITAL_COMMUNITY)
Admission: RE | Admit: 2022-06-05 | Discharge: 2022-06-05 | Disposition: A | Discharge status: Home / Self Care | Payer: Medicare Other | Source: Ambulatory Visit | Attending: General Surgery | Admitting: General Surgery

## 2022-06-05 NOTE — Progress Notes (Signed)
I met with the patient and his wife during and following initial visit with Dr. Delton Coombes. I introduced myself and explained my role in the patient's care. I provided my contact information and encouraged the patient and family to call with questions or concerns.

## 2022-06-05 NOTE — H&P (Signed)
[facility] History and Physical  Reason for Referral: Supraclavicular adenopathy, concern for lymphoma  Referring Physician:  [name], Fransisca Kaufmann, MD, Dr. Delton Coombes   Chief Complaint   New Patient (Initial Visit)     [name] is a 88 y.o. male.  HPI: [name] is a very sweet 88 yo who comes in with reports of right sided adenopathy above his clavicle. [name] had a T scan that demonstrated this adenopathy and some lung nodules. [name] was seen by Dr. Delton Coombes this week and reported to him that [name] noted the mass on the neck around Christmas 2022. [name] denies any dysphagia or odynophagia. [name] does his ADLS and IADLs. 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  [name] had blood studies that demonstrate  concern for lymphoma and Dr. Delton Coombes wants him to proceed with clavicular node biopsy and port placement.   [name] is on Coumadin for A fib. 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 [name] has no recent CP or SOB. [name] saw Dr. Jenkins Rouge 03/2022 and was stable from a cardiac standpoint.   Past Medical History:  Diagnosis Date   Anemia    Atrial fibrillation (HCC)    Cataract    Cellulitis    In past   Complication of anesthesia    HARD TIME WAKING; CAUSES MY BP TO GO UP    Coronary artery disease    5 bypasses   DJD (degenerative joint disease)    Ejection fraction < 50%    Mildly reduced, 40% by echo   GERD (gastroesophageal reflux disease)    Hyperlipidemia    Hypertension    Persistent atrial fibrillation ([place])    Prostate hypertrophy    on CT scan 09/2014   PUD (peptic ulcer disease)    RESOLVED    Skin cancer of eyelid    Resected    Past Surgical History:  Procedure Laterality Date   APPENDECTOMY     BYPASS GRAFT  2005   CATARACT EXTRACTION W/PHACO Left 07/22/2015   Procedure: CATARACT EXTRACTION PHACO AND INTRAOCULAR LENS PLACEMENT LEFT EYE CDE=8.14;  Surgeon: Tonny Branch, MD;  Location: [facility];  Service: Ophthalmology;  Laterality: Left;   CATARACT  EXTRACTION W/PHACO Right 08/18/2019   Procedure: CATARACT EXTRACTION PHACO AND INTRAOCULAR LENS PLACEMENT (IOC);  Surgeon: Baruch Goldmann, MD;  Location: [facility];  Service: Ophthalmology;  Laterality: Right;  CDE: 9.74   CORONARY ARTERY BYPASS GRAFT  4/05   LIMA to LAD, SVG to PDA, SVG to ramus intermediate   EYE SURGERY  07/2015   EYELID CARCINOMA EXCISION     Skin cancer resected   Heart bypass  2005   TOTAL HIP ARTHROPLASTY     Right   TOTAL HIP ARTHROPLASTY Left 05/04/2018   Procedure: LEFT TOTAL HIP ARTHROPLASTY;  Surgeon: [name], MD;  Location: [facility];  Service: Orthopedics;  Laterality: Left;   TOTAL KNEE ARTHROPLASTY     Right    Family History  Problem Relation Age of Onset   Stroke Mother    Stroke Father    Hypertension Father    Early death Brother        45 months old   Cancer Brother        lung   Stroke Brother        heat    Social History   Tobacco Use   Smoking status: Former    Packs/day: 2.00    Years: 5.00  Total pack years: 10.00    Types: Cigarettes    Quit date: 12/23/1963    Years since quitting: 58.4   Smokeless tobacco: Never  Vaping Use   Vaping Use: Never used  Substance Use Topics   Alcohol use: No   Drug use: No    Medications: I have reviewed the patient's current medications. Allergies as of 06/04/2022       Reactions   Penicillins Hives, Rash   Has patient had a PCN reaction causing immediate rash, facial/tongue/throat swelling, SOB or lightheadedness with hypotension: Yes Has patient had a PCN reaction causing severe rash involving mucus membranes or skin necrosis: Yes Has patient had a PCN reaction that required hospitalization: No Has patient had a PCN reaction occurring within the last 10 years: No If all of the above answers are "NO", then may proceed with Cephalosporin use.   Hct [hydrochlorothiazide] Other (See Comments)   hyper   Statins Other (See Comments)   Myalgia, tolerates low dosages of lovastatin          Medication List        Accurate as of June 04, 2022  4:07 PM. If you have any questions, ask your nurse or doctor.          STOP taking these medications    montelukast 10 MG tablet Commonly known as: SINGULAIR Stopped by: Tres Grzywacz C Janiece Scovill, MD       TAKE these medications    acetaminophen 500 MG tablet Commonly known as: TYLENOL Take 1,000 mg by mouth every 6 (six) hours as needed for moderate pain.   azelastine 0.1 % nasal spray Commonly known as: ASTELIN Place 1 spray into both nostrils 2 (two) times daily. Use in each nostril as directed   cetirizine-pseudoephedrine 5-120 MG tablet Commonly known as: ZYRTEC-D Take once per day as need for congestion/runny nose   diltiazem 120 MG 24 hr capsule Commonly known as: CARDIZEM CD Take one capsule daily   ezetimibe 10 MG tablet Commonly known as: ZETIA Take 1 tablet (10 mg total) by mouth daily.   finasteride 5 MG tablet Commonly known as: PROSCAR Take one tablet each day   fluticasone 50 MCG/ACT nasal spray Commonly known as: FLONASE Place 1 spray into both nostrils daily as needed for allergies or rhinitis.   furosemide 20 MG tablet Commonly known as: LASIX Take 1 tablet (20 mg total) by mouth daily.   lovastatin 20 MG tablet Commonly known as: MEVACOR TAKE 1/2 TABLET EVERY OTHER DAY   metoprolol tartrate 100 MG tablet Commonly known as: LOPRESSOR Take 1 tablet (100 mg total) by mouth 2 (two) times daily.   SYSTANE OP Place 1 drop into both eyes 3 (three) times daily as needed (dry eyes).   Vitamin D3 125 MCG (5000 UT) Caps Take 5,000 Units by mouth once a week.   warfarin 5 MG tablet Commonly known as: COUMADIN Take as directed by the anticoagulation clinic. If you are unsure how to take this medication, talk to your nurse or doctor. Original instructions: TAKE 1 TABLET ON SAT, SUN, TUES, & THURSTAKE 1/2 TABLET ON MON, WED, & FRIDAY         ROS:  A comprehensive review of systems  was negative except for: Respiratory: positive for SOB Genitourinary: positive for frequency Hematologic/lymphatic: positive for lymphadenopathy Musculoskeletal: positive for neck pain and joint pain  Blood pressure (!) 155/91, pulse 77, temperature 98.3 F (36.8 C), temperature source Oral, resp. rate 14, height 5' 6" (1.676   m), weight 182 lb (82.6 kg), SpO2 93 %. Physical Exam Vitals reviewed.  Constitutional:      Appearance: Normal appearance.  HENT:     Head: Normocephalic.     Nose: Nose normal.  Eyes:     Extraocular Movements: Extraocular movements intact.  Cardiovascular:     Rate and Rhythm: Normal rate.  Pulmonary:     Effort: Pulmonary effort is normal.  Abdominal:     General: There is no distension.     Palpations: Abdomen is soft.     Tenderness: There is no abdominal tenderness.  Musculoskeletal:        General: Normal range of motion.  Lymphadenopathy:     Cervical: Cervical adenopathy present.     Right cervical: Superficial cervical adenopathy present.     Left cervical: Superficial cervical adenopathy present.     Upper Body:     Right upper body: Supraclavicular adenopathy present.  Skin:    General: Skin is warm.  Neurological:     General: No focal deficit present.     Mental Status: [name] is alert.  Psychiatric:        Mood and Affect: Mood normal.        Thought Content: Thought content normal.        Judgment: Judgment normal.     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  Note: [name] cytometry identifies a lambda restricted B-cell population coexpressing CD5 and CD200 consistent with a B-cell lymphoproliferative disorder with a CLL immunophenotype.  Given the overall absolute number this is best currently described as a monoclonal B-cell lymphocytosis; however, it is noted that there is supraclavicular adenopathy. Therefore this could represent a small lymphocytic lymphoma.  A lymph node biopsy may be necessary to fully exclude small lymphocytic lymphoma.  It should be noted that on the smear review while the white blood cells including lymphocytes and red blood cells are relatively unremarkable, there is a significant amo unt of platelet clumping which could aberrantly decreased the overall platelet count and a repeat platelet count with a citrate tube could be considered.  GATING AND PHENOTYPIC ANALYSIS:  Gated population: [name] cytometric immunophenotyping is performed using antibodies to the antigens listed in the table below. Electronic gates are placed around a cell cluster displaying light scatter properties corresponding to: lymphocytes  Abnormal Cells in gated population: 24%  Phenotype of Abnormal Cells: CD5, CD19, CD20, CD200, Lambda                        Lymphoid Antigens       Myeloid Antigens Miscellaneous CD2  NEG  CD10 NEG  CD11b     ND   CD45 POS CD3  NEG  CD19 POS  CD11c     ND   HLA-Dr    ND CD4  NEG  CD20 POS  CD13 ND   CD34 NEG CD5  POS  CD22 ND   CD14 ND  CD38 NEG CD7  NEG  CD79b     ND   CD15 ND   CD138     ND CD8  NEG  CD103     ND   CD16 ND   TdT  ND CD25 ND   CD200     POS  CD33 ND   CD123     ND TCRab     ND   sKappa    NEG  CD64 ND   CD41 ND TCRgd      NEG  sLambda   POS  CD117     ND   CD61 ND CD56 NEG  cKappa    ND   MPO  ND   CD71 ND CD57 ND   cLambda   ND        CD235aND      GROSS DESCRIPTION:  One lavender top tube submitted from [facility] at [place] for  lymphoma testing.    Final Diagnosis performed by Mark LeGolvan DO.   Electronically signed 06/04/2022 Technical and / or Professional components performed at [facility], [address] [place], [place] [address].  The above tests were developed and their performance characteristics determined by the [facility] system for the physical and immunophenotypic characterization of cell populations. They have not been cleared by the U.S. Food and Drug administration. The  FDA has determined that such clearance or approval is not necessary. This test is used for clinical purposes. It should not be  regarded as investigational or for research     Personally reviewed- large supraclavicular node that extends below the clavicle, more superficial node adjacent,   CLINICAL DATA:  Right neck mass   EXAM: CT NECK WITH CONTRAST   TECHNIQUE: Multidetector CT imaging of the neck was performed using the standard protocol following the bolus administration of intravenous contrast.   RADIATION DOSE REDUCTION: This exam was performed according to the departmental dose-optimization program which includes automated exposure control, adjustment of the mA and/or kV according to patient size and/or use of iterative reconstruction technique.   CONTRAST:  75mL OMNIPAQUE IOHEXOL 300 MG/ML  SOLN   COMPARISON:  None Available.   FINDINGS: Pharynx and larynx: Normal. No mass or swelling.   Salivary glands: No inflammation, mass, or stone.   Thyroid: Negative   Lymph nodes: Numerous pathologic lymph nodes in the right neck. Right supraclavicular lymph node mass measures 5.7 x 3.7 cm. 18 mm posterior lymph node in the right lower neck. Numerous additional lymph nodes in the right lower and posterior neck measuring approximately 1 cm. Many of these have internal necrosis and are consistent with metastatic disease. 20 mm lymph node just above the right clavicle also pathologic  and likely readily palpable   Left level 2 lymph node just above the hyoid bone measures 12 mm with central necrosis. This likely is due to metastatic disease. No other pathologic nodes in the left neck.   Vascular: Atherosclerotic calcification with carotid stenosis bilaterally left greater than right. Normal jugular enhancement. Diffusely disease right vertebral artery.   Limited intracranial: Negative   Visualized orbits: Bilateral cataract extraction   Mastoids and visualized paranasal sinuses: Mucosal edema and air-fluid level right maxillary sinus. Mild mucosal edema throughout the remainder of the paranasal sinuses. Mastoid and middle ear clear bilaterally.   Skeleton: Cervical spondylosis C5-6 and C6-7. No fracture or skeletal metastasis.   Upper chest: Multiple lung nodules in the apices with appearance of metastatic disease. Left upper lobe medial   lesion measuring approximately 18 mm in maximal dimension. Left upper lobe anterior lesion 12 mm. Right upper lobe lesions measuring 14 mm, 8 mm, 18 mm in diameter. No pleural effusion. Atherosclerotic aortic arch.   Other: None   IMPRESSION: Large lymph node mass in the right supraclavicular area compatible with metastatic disease.   Solitary pathologic lymph node in the left neck also highly suspicious for metastatic disease   Multiple lung nodule in the a lung apices bilaterally compatible with metastatic disease. Recommend CT chest abdomen pelvis with contrast to further evaluate for primary malignancy.   Diffuse atherosclerotic disease with bilateral carotid stenosis and right vertebral stenosis.     Electronically Signed   By: Franchot Gallo M.D.   On: 05/27/2022 17:25   Assessment & Plan:  KIYOSHI SCHAAB is a 88 y.o. male with adenopathy and concern for possible lymphoma versus other cancer. [name] needs a biopsy and a port. Discussed taking the superficial node on the right and getting a portio not the deeper  node. Discussed risk of bleeding, infection, nondiagnostic results. Discussed port placement on the other side and risk of bleeding,infection, injury to vessels and pneumothorax.   Hold Coumadin starting today for Tuesday OR    All questions were answered to the satisfaction of the patient and family.    Virl Cagey 06/04/2022, 4:07 PM

## 2022-06-08 ENCOUNTER — Ambulatory Visit (HOSPITAL_COMMUNITY)
Admission: RE | Admit: 2022-06-08 | Discharge: 2022-06-08 | Disposition: A | Discharge status: Home / Self Care | Payer: Medicare Other | Source: Ambulatory Visit | Attending: Hematology | Admitting: Hematology

## 2022-06-08 DIAGNOSIS — I639 Cerebral infarction, unspecified: Secondary | ICD-10-CM | POA: Diagnosis not present

## 2022-06-08 DIAGNOSIS — R918 Other nonspecific abnormal finding of lung field: Secondary | ICD-10-CM | POA: Insufficient documentation

## 2022-06-08 DIAGNOSIS — R590 Localized enlarged lymph nodes: Secondary | ICD-10-CM | POA: Diagnosis not present

## 2022-06-08 MED ORDER — GADOBUTROL 1 MMOL/ML IV SOLN
7.0000 mL | Freq: Once | INTRAVENOUS | Status: AC | PRN
  Administered 2022-06-08: 7 mL via INTRAVENOUS

## 2022-06-09 ENCOUNTER — Ambulatory Visit (HOSPITAL_BASED_OUTPATIENT_CLINIC_OR_DEPARTMENT_OTHER): Payer: Medicare Other

## 2022-06-09 ENCOUNTER — Other Ambulatory Visit: Payer: Self-pay

## 2022-06-09 ENCOUNTER — Encounter (HOSPITAL_COMMUNITY)
Admission: RE | Disposition: A | Discharge status: Home / Self Care | Payer: Self-pay | Source: Home / Self Care | Attending: General Surgery

## 2022-06-09 ENCOUNTER — Ambulatory Visit (HOSPITAL_COMMUNITY): Payer: Medicare Other

## 2022-06-09 ENCOUNTER — Encounter (HOSPITAL_COMMUNITY): Payer: Self-pay | Admitting: General Surgery

## 2022-06-09 ENCOUNTER — Ambulatory Visit (HOSPITAL_COMMUNITY)
Admission: RE | Admit: 2022-06-09 | Discharge: 2022-06-09 | Disposition: A | Discharge status: Home / Self Care | Payer: Medicare Other | Attending: General Surgery | Admitting: General Surgery

## 2022-06-09 DIAGNOSIS — C770 Secondary and unspecified malignant neoplasm of lymph nodes of head, face and neck: Secondary | ICD-10-CM | POA: Diagnosis not present

## 2022-06-09 DIAGNOSIS — I2510 Atherosclerotic heart disease of native coronary artery without angina pectoris: Secondary | ICD-10-CM | POA: Insufficient documentation

## 2022-06-09 DIAGNOSIS — I110 Hypertensive heart disease with heart failure: Secondary | ICD-10-CM | POA: Diagnosis not present

## 2022-06-09 DIAGNOSIS — Z87891 Personal history of nicotine dependence: Secondary | ICD-10-CM

## 2022-06-09 DIAGNOSIS — R590 Localized enlarged lymph nodes: Secondary | ICD-10-CM | POA: Diagnosis not present

## 2022-06-09 DIAGNOSIS — I739 Peripheral vascular disease, unspecified: Secondary | ICD-10-CM | POA: Insufficient documentation

## 2022-06-09 DIAGNOSIS — Z7901 Long term (current) use of anticoagulants: Secondary | ICD-10-CM | POA: Diagnosis not present

## 2022-06-09 DIAGNOSIS — I509 Heart failure, unspecified: Secondary | ICD-10-CM | POA: Diagnosis not present

## 2022-06-09 DIAGNOSIS — I4819 Other persistent atrial fibrillation: Secondary | ICD-10-CM

## 2022-06-09 DIAGNOSIS — I252 Old myocardial infarction: Secondary | ICD-10-CM | POA: Insufficient documentation

## 2022-06-09 DIAGNOSIS — C801 Malignant (primary) neoplasm, unspecified: Secondary | ICD-10-CM | POA: Insufficient documentation

## 2022-06-09 DIAGNOSIS — I4891 Unspecified atrial fibrillation: Secondary | ICD-10-CM | POA: Insufficient documentation

## 2022-06-09 DIAGNOSIS — Z951 Presence of aortocoronary bypass graft: Secondary | ICD-10-CM | POA: Diagnosis not present

## 2022-06-09 DIAGNOSIS — I10 Essential (primary) hypertension: Secondary | ICD-10-CM | POA: Diagnosis not present

## 2022-06-09 DIAGNOSIS — C771 Secondary and unspecified malignant neoplasm of intrathoracic lymph nodes: Secondary | ICD-10-CM | POA: Insufficient documentation

## 2022-06-09 DIAGNOSIS — R911 Solitary pulmonary nodule: Secondary | ICD-10-CM | POA: Diagnosis not present

## 2022-06-09 HISTORY — PX: LYMPH NODE BIOPSY: SHX201

## 2022-06-09 HISTORY — PX: PORTACATH PLACEMENT: SHX2246

## 2022-06-09 LAB — FLOW CYTOMETRY

## 2022-06-09 SURGERY — INSERTION, TUNNELED CENTRAL VENOUS DEVICE, WITH PORT
Anesthesia: General | Site: Chest | Laterality: Right

## 2022-06-09 MED ORDER — VANCOMYCIN HCL IN DEXTROSE 1-5 GM/200ML-% IV SOLN
INTRAVENOUS | Status: AC
  Filled 2022-06-09: qty 200

## 2022-06-09 MED ORDER — FENTANYL CITRATE (PF) 100 MCG/2ML IJ SOLN
INTRAMUSCULAR | Status: AC
  Filled 2022-06-09: qty 2

## 2022-06-09 MED ORDER — BUPIVACAINE HCL (PF) 0.5 % IJ SOLN
INTRAMUSCULAR | Status: AC
  Filled 2022-06-09: qty 30

## 2022-06-09 MED ORDER — CHLORHEXIDINE GLUCONATE 0.12 % MT SOLN: 15.0000 mL | Freq: Once | OROMUCOSAL | Status: DC

## 2022-06-09 MED ORDER — EPHEDRINE SULFATE (PRESSORS) 50 MG/ML IJ SOLN
INTRAMUSCULAR | Status: DC | PRN
  Administered 2022-06-09 (×3): 5 mg via INTRAVENOUS

## 2022-06-09 MED ORDER — LACTATED RINGERS IV SOLN: INTRAVENOUS | Status: DC | PRN

## 2022-06-09 MED ORDER — LIDOCAINE HCL (PF) 1 % IJ SOLN
INTRAMUSCULAR | Status: AC
  Filled 2022-06-09: qty 30

## 2022-06-09 MED ORDER — ORAL CARE MOUTH RINSE: 15.0000 mL | Freq: Once | OROMUCOSAL | Status: DC

## 2022-06-09 MED ORDER — 0.9 % SODIUM CHLORIDE (POUR BTL) OPTIME
TOPICAL | Status: DC | PRN
  Administered 2022-06-09: 1000 mL

## 2022-06-09 MED ORDER — HEPARIN SOD (PORK) LOCK FLUSH 100 UNIT/ML IV SOLN
INTRAVENOUS | Status: DC | PRN
  Administered 2022-06-09: 500 [IU] via INTRAVENOUS

## 2022-06-09 MED ORDER — FENTANYL CITRATE PF 50 MCG/ML IJ SOSY: 25.0000 ug | PREFILLED_SYRINGE | INTRAMUSCULAR | Status: DC | PRN

## 2022-06-09 MED ORDER — CHLORHEXIDINE GLUCONATE CLOTH 2 % EX PADS: 6.0000 | MEDICATED_PAD | Freq: Once | CUTANEOUS | Status: DC

## 2022-06-09 MED ORDER — LACTATED RINGERS IV SOLN
INTRAVENOUS | Status: DC
Start: 2022-06-09 — End: 2022-06-09

## 2022-06-09 MED ORDER — PROPOFOL 10 MG/ML IV BOLUS
INTRAVENOUS | Status: AC
  Filled 2022-06-09: qty 20

## 2022-06-09 MED ORDER — SODIUM CHLORIDE (PF) 0.9 % IJ SOLN
INTRAMUSCULAR | Status: DC | PRN
  Administered 2022-06-09: 10 mL via INTRAVENOUS

## 2022-06-09 MED ORDER — FENTANYL CITRATE (PF) 100 MCG/2ML IJ SOLN
INTRAMUSCULAR | Status: DC | PRN
  Administered 2022-06-09 (×4): 25 ug via INTRAVENOUS

## 2022-06-09 MED ORDER — VANCOMYCIN HCL IN DEXTROSE 1-5 GM/200ML-% IV SOLN
1000.0000 mg | INTRAVENOUS | Status: AC
  Administered 2022-06-09: 1000 mg via INTRAVENOUS

## 2022-06-09 MED ORDER — PROPOFOL 10 MG/ML IV BOLUS
INTRAVENOUS | Status: DC | PRN
  Administered 2022-06-09: 20 mg via INTRAVENOUS

## 2022-06-09 MED ORDER — LIDOCAINE HCL (PF) 1 % IJ SOLN
INTRAMUSCULAR | Status: DC | PRN
  Administered 2022-06-09: 6 mL
  Administered 2022-06-09: 24 mL

## 2022-06-09 MED ORDER — PROPOFOL 500 MG/50ML IV EMUL
INTRAVENOUS | Status: DC | PRN
  Administered 2022-06-09: 75 ug/kg/min via INTRAVENOUS

## 2022-06-09 MED ORDER — ONDANSETRON HCL 4 MG PO TABS: 4.0000 mg | ORAL_TABLET | Freq: Three times a day (TID) | ORAL | 1 refills | Status: DC | PRN

## 2022-06-09 MED ORDER — ONDANSETRON HCL 4 MG/2ML IJ SOLN: 4.0000 mg | Freq: Once | INTRAMUSCULAR | Status: DC | PRN

## 2022-06-09 MED ORDER — OXYCODONE HCL 5 MG PO TABS: 5.0000 mg | ORAL_TABLET | ORAL | 0 refills | Status: DC | PRN

## 2022-06-09 MED ORDER — HEPARIN SOD (PORK) LOCK FLUSH 100 UNIT/ML IV SOLN
INTRAVENOUS | Status: AC
  Filled 2022-06-09: qty 5

## 2022-06-09 SURGICAL SUPPLY — 50 items
ADH SKN CLS APL DERMABOND .7 (GAUZE/BANDAGES/DRESSINGS) ×4
APL PRP STRL LF ISPRP CHG 10.5 (MISCELLANEOUS) ×4
APPLICATOR CHLORAPREP 10.5 ORG (MISCELLANEOUS) ×2 IMPLANT
APPLIER CLIP 9.375 SM OPEN (CLIP)
APR CLP SM 9.3 20 MLT OPN (CLIP)
BAG DECANTER FOR FLEXI CONT (MISCELLANEOUS) ×2 IMPLANT
CLIP APPLIE 9.375 SM OPEN (CLIP) IMPLANT
CLOTH BEACON ORANGE TIMEOUT ST (SAFETY) ×2 IMPLANT
CNTNR URN SCR LID CUP LEK RST (MISCELLANEOUS) IMPLANT
CONT SPEC 4OZ STRL OR WHT (MISCELLANEOUS) ×2
COVER LIGHT HANDLE STERIS (MISCELLANEOUS) ×4 IMPLANT
DECANTER SPIKE VIAL GLASS SM (MISCELLANEOUS) ×2 IMPLANT
DERMABOND ADVANCED (GAUZE/BANDAGES/DRESSINGS) ×4
DERMABOND ADVANCED .7 DNX12 (GAUZE/BANDAGES/DRESSINGS) ×2 IMPLANT
DISSECTOR SURG LIGASURE 21 (MISCELLANEOUS) IMPLANT
DRAPE C-ARM FOLDED MOBILE STRL (DRAPES) ×2 IMPLANT
ELECT NDL TIP 2.8 STRL (NEEDLE) ×2 IMPLANT
ELECT NEEDLE TIP 2.8 STRL (NEEDLE) ×2 IMPLANT
ELECT REM PT RETURN 9FT ADLT (ELECTROSURGICAL) ×2
ELECTRODE REM PT RTRN 9FT ADLT (ELECTROSURGICAL) ×2 IMPLANT
GAUZE 4X4 16PLY ~~LOC~~+RFID DBL (SPONGE) ×2 IMPLANT
GLOVE BIO SURGEON STRL SZ 6.5 (GLOVE) ×2 IMPLANT
GLOVE BIOGEL PI IND STRL 6.5 (GLOVE) ×2 IMPLANT
GLOVE BIOGEL PI IND STRL 7.0 (GLOVE) ×4 IMPLANT
GLOVE BIOGEL PI INDICATOR 6.5 (GLOVE) ×2
GLOVE BIOGEL PI INDICATOR 7.0 (GLOVE) ×4
GOWN STRL REUS W/TWL LRG LVL3 (GOWN DISPOSABLE) ×4 IMPLANT
IV NS 500ML (IV SOLUTION) ×2
IV NS 500ML BAXH (IV SOLUTION) ×2 IMPLANT
KIT CLEAN CATCH URINE (SET/KITS/TRAYS/PACK) ×2 IMPLANT
KIT PORT POWER 8FR ISP MRI (Port) ×2 IMPLANT
KIT TURNOVER KIT A (KITS) ×2 IMPLANT
MANIFOLD NEPTUNE II (INSTRUMENTS) ×2 IMPLANT
NDL HYPO 25X1 1.5 SAFETY (NEEDLE) ×2 IMPLANT
NEEDLE HYPO 25X1 1.5 SAFETY (NEEDLE) ×2 IMPLANT
NS IRRIG 1000ML POUR BTL (IV SOLUTION) ×2 IMPLANT
PACK MINOR (CUSTOM PROCEDURE TRAY) ×2 IMPLANT
PAD ARMBOARD 7.5X6 YLW CONV (MISCELLANEOUS) ×2 IMPLANT
PAD TELFA 3X4 1S STER (GAUZE/BANDAGES/DRESSINGS) IMPLANT
SET BASIN LINEN APH (SET/KITS/TRAYS/PACK) ×2 IMPLANT
SHEARS HARMONIC 9CM CVD (BLADE) IMPLANT
SPONGE INTESTINAL PEANUT (DISPOSABLE) IMPLANT
SUT MNCRL AB 4-0 PS2 18 (SUTURE) ×2 IMPLANT
SUT PROLENE 2 0 SH 30 (SUTURE) ×2 IMPLANT
SUT SILK 2 0 (SUTURE)
SUT SILK 2-0 18XBRD TIE 12 (SUTURE) IMPLANT
SUT VIC AB 3-0 SH 27 (SUTURE) ×4
SUT VIC AB 3-0 SH 27X BRD (SUTURE) ×2 IMPLANT
SYR 10ML LL (SYRINGE) ×4 IMPLANT
SYR CONTROL 10ML LL (SYRINGE) ×2 IMPLANT

## 2022-06-09 NOTE — Interval H&P Note (Signed)
History and Physical Interval Note:  06/09/2022 12:26 PM  [name]  has presented today for surgery, with the diagnosis of Supraclavicular adenopathy.  The various methods of treatment have been discussed with the patient and family. After consideration of risks, benefits and other options for treatment, the patient has consented to  Procedure(s): INSERTION PORT-A-CATH (Left) LYMPH NODE BIOPSY; supraclavicular (Right) as a surgical intervention.  The patient's history has been reviewed, patient examined, no change in status, stable for surgery.  I have reviewed the patient's chart and labs.  Questions were answered to the patient's satisfaction.   Marked   Virl Cagey

## 2022-06-09 NOTE — Op Note (Signed)
[facility] Operative Note  06/09/22  Preoperative Diagnosis: Lymphadenopathy right supraclavicular, concern for lymphoma    Postoperative Diagnosis: Same   Procedure(s) Performed: Lymph node biopsy right supraclavicular, port a catheter placement left subclavian    Surgeon: Lanell Matar. Constance Haw, MD   Assistants: No qualified resident was available    Anesthesia: General endotracheal   Anesthesiologist: Louann Sjogren, MD    Specimens: Lymph nodes    Estimated Blood Loss: Minimal   Blood Replacement: None    Complications: None   Wound Class: Clean   Operative Indications: [name]   Findings: Large right supraclavicular nodes  Fluoroscopy: 4 s   Procedure: The patient was taken to the operating room and placed supine. Monitored anesthesia was induced. Intravenous antibiotics were administered per protocol. The right and left chest and neck were prepped and draped in the normal sterile fashion.   Lidocaine 1% was injected over the supraclavicular node on the right and an incision was made. With blunt and sharp dissection with both cautery and a Ligasure, I was able to get down to the enlarged node and dissect it out, ensuring that the Ligasure was used around the node to prevent against lymph leak. The nodes was over 2cm and hard in nature.  A second 1cm node was noted just posterior and the same incision was used to dissect this node out with sharp and blunt dissection in the same manner.  The nodes were superficial above the sternocleidomastoid muscle and just lateral to the muscle.  The cavity was irrigated and made hemostatic and packed off with a gauze.   Attention was then turned to the left chest. Lidocaine was injected. A subcutaneous pocket was formed. The needles advanced into the  subclavian vein using the Seldinger technique without difficulty. A guidewire was then advanced into the right atrium under fluoroscopic guidance.  Ectopia was not noted.  An introducer and peel-away sheath were placed over the guidewire. The catheter was then inserted through the peel-away sheath and the peel-away sheath was removed.  A spot film was performed to confirm the position. The catheter was then attached to the port and the port placed in subcutaneous pocket. Adequate positioning was confirmed by fluoroscopy. Hemostasis was confirmed, and the port was secured with 2-0 prolene sutures.  Good backflow of blood was noted on aspiration of the port. The port was flushed with heparin flush.   Subcutaneous layer was reapproximated using a 3-0 Vicryl interrupted suture. The skin was closed using a 4-0 Monocryl subcuticular suture. Dermabond was applied.  The right neck incision was closed with 3-0 Vicryl interrupted suture deep and 4-0 Monocryl subcuticular suture and dermabond on the skin.     All tape and needle counts were correct at the end of the procedure. The patient was transferred to PACU in stable condition. A chest x-ray will be performed at that time.  Greg Labrum, MD [facility] [address] [place], [place] [address] [phone] (office)

## 2022-06-09 NOTE — Anesthesia Preprocedure Evaluation (Signed)
Anesthesia Evaluation  Patient identified by MRN, date of birth, ID band Patient awake    Reviewed: Allergy & Precautions, H&P , NPO status , Patient's Chart, lab work & pertinent test results, reviewed documented beta blocker date and time   Airway Mallampati: II  TM Distance: >3 FB Neck ROM: full    Dental no notable dental hx.    Pulmonary neg pulmonary ROS, former smoker,    Pulmonary exam normal breath sounds clear to auscultation       Cardiovascular Exercise Tolerance: Good hypertension, + [name], + CABG, + Peripheral Vascular Disease and +CHF   Rhythm:regular Rate:Normal     Neuro/Psych negative neurological ROS  negative psych ROS   GI/Hepatic Neg liver ROS, PUD, GERD  Medicated,  Endo/Other  negative endocrine ROS  Renal/GU negative Renal ROS  negative genitourinary   Musculoskeletal   Abdominal   Peds  Hematology  (+) Blood dyscrasia, anemia ,   Anesthesia Other Findings   Reproductive/Obstetrics negative OB ROS                             Anesthesia Physical Anesthesia Plan  ASA: 3  Anesthesia Plan: General   Post-op Pain Management:    Induction:   PONV Risk Score and Plan: Propofol infusion  Airway Management Planned:   Additional Equipment:   Intra-op Plan:   Post-operative Plan:   Informed Consent: I have reviewed the patients History and Physical, chart, labs and discussed the procedure including the risks, benefits and alternatives for the proposed anesthesia with the patient or authorized representative who has indicated his/her understanding and acceptance.     Dental Advisory Given  Plan Discussed with: CRNA  Anesthesia Plan Comments:         Anesthesia Quick Evaluation

## 2022-06-09 NOTE — Anesthesia Procedure Notes (Signed)
Procedure Name: MAC Date/Time: 06/09/2022 12:51 PM  Performed by: Encarnacion Chu, RNPre-anesthesia Checklist: Patient identified, Emergency Drugs available, Suction available and Patient being monitored Patient Re-evaluated:Patient Re-evaluated prior to induction Oxygen Delivery Method: Nasal cannula

## 2022-06-09 NOTE — Transfer of Care (Signed)
Immediate Anesthesia Transfer of Care Note  Patient: [name]  Procedure(s) Performed: INSERTION PORT-A-CATH (Left: Chest) LYMPH NODE BIOPSY; supraclavicular (Right: Chest)  Patient Location: Short Stay  Anesthesia Type:MAC  Level of Consciousness: awake, alert  and oriented  Airway & Oxygen Therapy: Patient Spontanous Breathing  Post-op Assessment: Report given to RN and Post -op Vital signs reviewed and stable  Post vital signs: Reviewed and stable  Last Vitals:  Vitals Value Taken Time  BP 139/78 06/09/22 1425  Temp 36.4 C 06/09/22 1401  Pulse 57 06/09/22 1401  Resp 12 06/09/22 1401  SpO2 98 % 06/09/22 1401    Last Pain:  Vitals:   06/09/22 1401  TempSrc: Oral  PainSc: 0-No pain      Patients Stated Pain Goal: 8 (03/49/61 1643)  Complications: No notable events documented.

## 2022-06-09 NOTE — Progress Notes (Signed)
DG Chest Port 1 View report read.  New left chest wall port in place with tip terminating in the mid SVC.  Patient discharged home.

## 2022-06-09 NOTE — Discharge Instructions (Signed)
Keep area clean and dry. You can take a shower in 24 hours. Do not submerge in water.  Take tylenol and ibuprofen for pain control and Roxicodone for severe pain.  Ice to the incision is good for the first 72 hours. Monitor for bruising. Some bruising is fine. If there areas are swelling call some one and let them know. Restart Coumadin 06/12/2022 unless there is significant bruising or swelling and if so call before restarting.   Common Complaints: Pain and bruising at the incision site is common.  Some nausea is common and poor appetite. The main goal is to stay hydrated the first few days after surgery.   Diet/ Activity: Diet as tolerated. You may not have an appetite, but it is important to stay hydrated. Drink 64 ounces of water a day. Your appetite will return with time.  Shower per your regular routine daily.  Do not take hot showers. Take warm showers that are less than 10 minutes. Walk everyday for at least 15-20 minutes. Deep cough and move around every 1-2 hours in the first few days after surgery.  Limit excessive movement, lifting > 10 lbs, stretching with the limb if there is an incision on your arm/armpit or leg.   Limit stretching, pulling on your incision if it is located on other parts of your body.  Do not pick at the dermabond glue on your incision sites. This glue film will remain in place for 1-2 weeks and will start to peel off.   Do not place lotions or balms on your incision unless instructed to specifically by Dr. Constance Haw.   Medication: Take tylenol and ibuprofen as needed for pain control, alternating every 4-6 hours.  Example:  Tylenol 1000mg  @ 6am, 12noon, 6pm, 66midnight (Do not exceed 4000mg  of tylenol a day). Ibuprofen 800mg  @ 9am, 3pm, 9pm, 3am (Do not exceed 3600mg  of ibuprofen a day).  Take Roxicodone for breakthrough pain every 4 hours.  Take Colace for constipation related to narcotic pain medication. If you do not have a bowel movement in 2 days, take  Miralax over the counter.  Drink plenty of water to also prevent constipation.   Contact Information: If you have questions or concerns, please call our office, [phone], Monday- Thursday 8AM-5PM and Friday 8AM-12Noon.  If it is after hours or on the weekend, please call [facility] Main [facility], [phone], [phone], and ask to speak to the surgeon on call for Dr. Constance Haw at [facility].

## 2022-06-09 NOTE — Progress Notes (Signed)
[facility]  Update family and ordered CXR. CXR must be read before he goes home. Ice to the incisions for first 72 hours to help with swelling. Coumadin restart 9/1 unless significant swelling and bruising and if so call the office before restarting.  Will see 9/14 for wound check.  Curlene Labrum, MD [facility] [address] [place], [place] [address] [phone] (office)

## 2022-06-10 ENCOUNTER — Ambulatory Visit: Payer: Medicare Other | Admitting: Family Medicine

## 2022-06-11 ENCOUNTER — Ambulatory Visit (HOSPITAL_COMMUNITY)
Admission: RE | Admit: 2022-06-11 | Discharge: 2022-06-11 | Disposition: A | Discharge status: Home / Self Care | Payer: Medicare Other | Source: Ambulatory Visit | Attending: Hematology | Admitting: Hematology

## 2022-06-11 DIAGNOSIS — R590 Localized enlarged lymph nodes: Secondary | ICD-10-CM | POA: Diagnosis not present

## 2022-06-11 DIAGNOSIS — R918 Other nonspecific abnormal finding of lung field: Secondary | ICD-10-CM | POA: Diagnosis not present

## 2022-06-11 DIAGNOSIS — C770 Secondary and unspecified malignant neoplasm of lymph nodes of head, face and neck: Secondary | ICD-10-CM | POA: Diagnosis not present

## 2022-06-11 MED ORDER — FLUDEOXYGLUCOSE F - 18 (FDG) INJECTION
10.1500 | Freq: Once | INTRAVENOUS | Status: AC | PRN
  Administered 2022-06-11: 10.15 via INTRAVENOUS

## 2022-06-11 NOTE — Anesthesia Postprocedure Evaluation (Signed)
Anesthesia Post Note  Patient: Zohan Shiflet Blackham  Procedure(s) Performed: INSERTION PORT-A-CATH (Left: Chest) LYMPH NODE BIOPSY; supraclavicular (Right: Chest)  Patient location during evaluation: Phase II Anesthesia Type: General Level of consciousness: awake Pain management: pain level controlled Vital Signs Assessment: post-procedure vital signs reviewed and stable Respiratory status: spontaneous breathing and respiratory function stable Cardiovascular status: blood pressure returned to baseline and stable Postop Assessment: no headache and no apparent nausea or vomiting Anesthetic complications: no Comments: Late entry   No notable events documented.   Last Vitals:  Vitals:   06/09/22 1401 06/09/22 1425  BP: 125/70 139/78  Pulse: (!) 57   Resp: 12   Temp: (!) 36.4 C   SpO2: 98%     Last Pain:  Vitals:   06/10/22 1544  TempSrc:   PainSc: 0-No pain                 Louann Sjogren

## 2022-06-16 ENCOUNTER — Encounter (HOSPITAL_COMMUNITY): Payer: Self-pay | Admitting: General Surgery

## 2022-06-16 NOTE — Progress Notes (Signed)
Patient sees Dr. Delton Coombes tomorrow. But can you let him know that the lymph node came back as metastatic cancer. Dr. Delton Coombes will discuss this with them further.

## 2022-06-17 ENCOUNTER — Inpatient Hospital Stay: Payer: Medicare Other | Attending: Hematology | Admitting: Hematology

## 2022-06-17 ENCOUNTER — Encounter: Payer: Self-pay | Admitting: Hematology

## 2022-06-17 DIAGNOSIS — R918 Other nonspecific abnormal finding of lung field: Secondary | ICD-10-CM | POA: Diagnosis not present

## 2022-06-17 DIAGNOSIS — Z87891 Personal history of nicotine dependence: Secondary | ICD-10-CM | POA: Insufficient documentation

## 2022-06-17 DIAGNOSIS — Z801 Family history of malignant neoplasm of trachea, bronchus and lung: Secondary | ICD-10-CM | POA: Diagnosis not present

## 2022-06-17 DIAGNOSIS — I10 Essential (primary) hypertension: Secondary | ICD-10-CM | POA: Insufficient documentation

## 2022-06-17 DIAGNOSIS — C801 Malignant (primary) neoplasm, unspecified: Secondary | ICD-10-CM | POA: Diagnosis not present

## 2022-06-17 DIAGNOSIS — C771 Secondary and unspecified malignant neoplasm of intrathoracic lymph nodes: Secondary | ICD-10-CM | POA: Diagnosis not present

## 2022-06-17 LAB — SURGICAL PATHOLOGY

## 2022-06-17 NOTE — Progress Notes (Signed)
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    CANCER STAGING:  Cancer Staging  No matching staging information was found for the patient.   INTERVAL HISTORY:  [name] 88 y.o. male seen for follow-up of right supraclavicular lymphadenopathy and multiple lung nodules.  He is reporting low energy levels but denies any new onset pains.  Denies any dysphagia.  Reports headaches which are stable.  Appetite is 100%.    REVIEW OF SYSTEMS:  Review of Systems  Neurological:  Positive for headaches.  Hematological:  Bruises/bleeds easily.  All other systems reviewed and are negative.    PAST MEDICAL/SURGICAL HISTORY:  Past Medical History:  Diagnosis Date   Anemia    Atrial fibrillation (HCC)    Cataract    Cellulitis    In past   Complication of anesthesia    HARD TIME WAKING; CAUSES MY BP TO GO UP    Coronary artery disease    5 bypasses   DJD (degenerative joint disease)    Ejection fraction < 50%    Mildly reduced, 40% by echo   GERD (gastroesophageal reflux disease)    Hyperlipidemia    Hypertension    Persistent atrial fibrillation ([place])    Prostate hypertrophy    on CT scan 09/2014   PUD (peptic ulcer disease)    RESOLVED    Skin cancer of eyelid    Resected   Past Surgical History:  Procedure Laterality Date   APPENDECTOMY     BYPASS GRAFT  2005   CATARACT EXTRACTION W/PHACO Left 07/22/2015   Procedure: CATARACT EXTRACTION PHACO AND INTRAOCULAR LENS PLACEMENT LEFT EYE CDE=8.14;  Surgeon: Tonny Branch, MD;  Location: [facility];  Service: Ophthalmology;  Laterality: Left;   CATARACT EXTRACTION  W/PHACO Right 08/18/2019   Procedure: CATARACT EXTRACTION PHACO AND INTRAOCULAR LENS PLACEMENT (IOC);  Surgeon: Baruch Goldmann, MD;  Location: [facility];  Service: Ophthalmology;  Laterality: Right;  CDE: 9.74   CORONARY ARTERY BYPASS GRAFT  4/05   LIMA to LAD, SVG to PDA, SVG to ramus intermediate   EYE SURGERY  07/2015   EYELID CARCINOMA EXCISION     Skin cancer resected   Heart bypass  2005   LYMPH NODE BIOPSY Right 06/09/2022   Procedure: LYMPH NODE BIOPSY; supraclavicular;  Surgeon: Virl Cagey, MD;  Location: [facility];  Service: General;  Laterality: Right;   PORTACATH PLACEMENT Left 06/09/2022   Procedure: INSERTION PORT-A-CATH;  Surgeon: Virl Cagey, MD;  Location: [facility];  Service: General;  Laterality: Left;   TOTAL HIP ARTHROPLASTY     Right   TOTAL HIP ARTHROPLASTY Left 05/04/2018   Procedure: LEFT TOTAL HIP ARTHROPLASTY;  Surgeon: [name], MD;  Location: [facility];  Service: Orthopedics;  Laterality: Left;   TOTAL KNEE ARTHROPLASTY     Right     SOCIAL HISTORY:  Social History   Socioeconomic History   Marital status: Married    Spouse name: [name]   Number of children: 1   Years of education: 10   Highest education level: 10th grade  Occupational History   Occupation: retired  Tobacco Use  Smoking status: Former    Packs/day: 2.00    Years: 5.00    Total pack years: 10.00    Types: Cigarettes    Quit date: 12/23/1963    Years since quitting: 58.5   Smokeless tobacco: Never  Vaping Use   Vaping Use: Never used  Substance and Sexual Activity   Alcohol use: No   Drug use: No   Sexual activity: Yes  Other Topics Concern   Not on file  Social History Narrative   Lives in a split level home.   Social Determinants of Health   Financial Resource Strain: Low Risk  (12/11/2021)   Overall Financial Resource Strain (CARDIA)    Difficulty of Paying Living Expenses: Not hard at all  Food Insecurity: No Food Insecurity (12/11/2021)   Hunger Vital Sign     Worried About Running Out of Food in the Last Year: Never true    Ran Out of Food in the Last Year: Never true  Transportation Needs: No Transportation Needs (12/11/2021)   PRAPARE - Hydrologist (Medical): No    Lack of Transportation (Non-Medical): No  Physical Activity: Insufficiently Active (12/11/2021)   Exercise Vital Sign    Days of Exercise per Week: 7 days    Minutes of Exercise per Session: 20 min  Stress: No Stress Concern Present (12/11/2021)   [place]    Feeling of Stress : Only a little  Recent Concern: Stress - Stress Concern Present (10/31/2021)   [place]    Feeling of Stress : To some extent  Social Connections: Moderately Isolated (12/11/2021)   Social Connection and Isolation Panel [NHANES]    Frequency of Communication with Friends and Family: More than three times a week    Frequency of Social Gatherings with Friends and Family: More than three times a week    Attends Religious Services: Never    Marine scientist or Organizations: No    Attends Archivist Meetings: Never    Marital Status: Married  Human resources officer Violence: Not At Risk (12/11/2021)   Humiliation, Afraid, Rape, and Kick questionnaire    Fear of Current or Ex-Partner: No    Emotionally Abused: No    Physically Abused: No    Sexually Abused: No    FAMILY HISTORY:  Family History  Problem Relation Age of Onset   Stroke Mother    Stroke Father    Hypertension Father    Early death Brother        29 months old   Cancer Brother        lung   Stroke Brother        heat    CURRENT MEDICATIONS:  Outpatient Encounter Medications as of 06/17/2022  Medication Sig   acetaminophen (TYLENOL) 500 MG tablet Take 1,000 mg by mouth every 6 (six) hours as needed for moderate pain.   cetirizine-pseudoephedrine (ZYRTEC-D) 5-120 MG  tablet Take once per day as need for congestion/runny nose   Cholecalciferol (VITAMIN D3) 5000 units CAPS Take 5,000 Units by mouth once a week.    diltiazem (CARDIZEM CD) 120 MG 24 hr capsule Take one capsule daily   ezetimibe (ZETIA) 10 MG tablet Take 1 tablet (10 mg total) by mouth daily.   finasteride (PROSCAR) 5 MG tablet Take one tablet each day   fluticasone (FLONASE) 50 MCG/ACT nasal spray Place 1 spray into both  nostrils daily as needed for allergies or rhinitis.   furosemide (LASIX) 20 MG tablet Take 1 tablet (20 mg total) by mouth daily.   lovastatin (MEVACOR) 20 MG tablet TAKE 1/2 TABLET EVERY OTHER DAY   metoprolol tartrate (LOPRESSOR) 100 MG tablet Take 1 tablet (100 mg total) by mouth 2 (two) times daily.   ondansetron (ZOFRAN) 4 MG tablet Take 1 tablet (4 mg total) by mouth every 8 (eight) hours as needed.   oxyCODONE (ROXICODONE) 5 MG immediate release tablet Take 1 tablet (5 mg total) by mouth every 4 (four) hours as needed for breakthrough pain or severe pain.   Polyethyl Glycol-Propyl Glycol (SYSTANE OP) Place 1 drop into both eyes 3 (three) times daily as needed (dry eyes).    warfarin (COUMADIN) 5 MG tablet TAKE 1 TABLET ON SAT, SUN, TUES, & THURSTAKE 1/2 TABLET ON MON, WED, & FRIDAY   [DISCONTINUED] azelastine (ASTELIN) 0.1 % nasal spray Place 1 spray into both nostrils 2 (two) times daily. Use in each nostril as directed   No facility-administered encounter medications on file as of 06/17/2022.    ALLERGIES:  Allergies  Allergen Reactions   Penicillins Hives and Rash    Has patient had a PCN reaction causing immediate rash, facial/tongue/throat swelling, SOB or lightheadedness with hypotension: Yes Has patient had a PCN reaction causing severe rash involving mucus membranes or skin necrosis: Yes Has patient had a PCN reaction that required hospitalization: No Has patient had a PCN reaction occurring within the last 10 years: No If all of the above answers are "NO",  then may proceed with Cephalosporin use.    Hct [Hydrochlorothiazide] Other (See Comments)    hyper   Statins Other (See Comments)    Myalgia, tolerates low dosages of lovastatin      PHYSICAL EXAM:  ECOG Performance status: 1  Vitals:   06/17/22 1311  BP: 124/72  Pulse: 76  Resp: 18  Temp: 98 F (36.7 C)  SpO2: 95%   Filed Weights   06/17/22 1311  Weight: 181 lb 11.2 oz (82.4 kg)   Physical Exam Vitals reviewed.  Constitutional:      Appearance: Normal appearance.  Cardiovascular:     Rate and Rhythm: Regular rhythm.     Heart sounds: Normal heart sounds.  Pulmonary:     Breath sounds: Normal breath sounds.  Neurological:     Mental Status: He is alert.  Psychiatric:        Mood and Affect: Mood normal.        Behavior: Behavior normal.   Left supraclavicular lymph node area biopsy site is well-healed.  LABORATORY DATA:  I have reviewed the labs as listed.  CBC    Component Value Date/Time   WBC 11.5 (H) 06/03/2022 1039   RBC 4.17 (L) 06/03/2022 1039   HGB 13.9 06/03/2022 1039   HGB 13.3 05/22/2022 0955   HCT 43.4 06/03/2022 1039   HCT 39.4 05/22/2022 0955   PLT 242 06/03/2022 1039   PLT 372 05/22/2022 0955   MCV 104.1 (H) 06/03/2022 1039   MCV 98 (H) 05/22/2022 0955   MCH 33.3 06/03/2022 1039   MCHC 32.0 06/03/2022 1039   RDW 13.2 06/03/2022 1039   RDW 11.6 05/22/2022 0955   LYMPHSABS 4.2 (H) 06/03/2022 1039   LYMPHSABS 5.1 (H) 05/22/2022 0955   MONOABS 0.6 06/03/2022 1039   EOSABS 0.3 06/03/2022 1039   EOSABS 0.3 05/22/2022 0955   BASOSABS 0.1 06/03/2022 1039   BASOSABS 0.1 05/22/2022 0955  Latest Ref Rng & Units 06/03/2022   10:39 AM 05/22/2022    9:55 AM 05/11/2022    5:02 AM  CMP  Glucose 70 - 99 mg/dL 104  122  95   BUN 8 - 23 mg/dL 13  13  22    Creatinine 0.61 - 1.24 mg/dL 1.05  1.11  0.94   Sodium 135 - 145 mmol/L 142  142  141   Potassium 3.5 - 5.1 mmol/L 4.2  4.4  4.0   Chloride 98 - 111 mmol/L 106  101  106   CO2 22 - 32  mmol/L 28  25  29    Calcium 8.9 - 10.3 mg/dL 9.2  9.4  8.8   Total Protein 6.5 - 8.1 g/dL 7.7  7.0    Total Bilirubin 0.3 - 1.2 mg/dL 1.1  0.6    Alkaline Phos 38 - 126 U/L 68  88    AST 15 - 41 U/L 26  26    ALT 0 - 44 U/L 18  30      DIAGNOSTIC IMAGING:  I have independently reviewed the scans and discussed with the patient.  ASSESSMENT: 1.  Right supraclavicular lymphadenopathy and multiple lung nodules: - Patient noticed right neck mass since Christmas 2022.  10 to 15 pound weight loss since January 2023.  Denies any dysphagia or odynophagia. - CT neck (05/27/2022): Numerous lymph nodes in the right neck, right supraclavicular lymph node mass measures 5.7 x 3.7 cm.  18 mm posterior lymph node in the right lower neck.  Numerous lymph nodes in the right lower and posterior neck approximately 1 cm.  Many have internal necrosis consistent with metastatic disease.  20 mm lymph node just above the right clavicle.  Left level 2 node 12 mm. - CT CAP (05/28/2022): Several bilateral lung nodules, RUL nodule measuring 1.8 x 1.3 cm.  Small clusters of nodules in the medial left upper lobe.  Irregular nodular density along the left side of the mediastinum measuring 2.1 cm.  No metastatic disease in the abdomen or pelvis.  Liver is slightly nodular contour. - Lab work shows elevated white count since 2009, predominantly lymphocytosis. - Pathology (06/09/2022): Metastatic carcinoma positive for p40, p63 and CK5/6-patchy positivity with CK7.  Negative for TTF-1, Napsin A, PAX8, prostein, PSA, CK20 and CDX2.  - PET scan (06/11/2022): Bulky right supraclavicular nodal mass, smaller hypermetabolic right posterior triangle and jugular lymph nodes and single hypermetabolic left level 3 lymph node.  Bilateral hypermetabolic pulmonary nodules and metastatic pattern.  Minimal hypermetabolic mediastinal nodal metastasis.  Cluster of hypermetabolic right axillary lymph nodes.  No evidence of metastatic disease or primary  lesion in the abdomen or pelvis.  No bone lesions. - MRI of the brain: Chronic small vessel ischemic changes with no evidence of metastatic disease.  2.  Social/family history: - He lives at home with his wife.  Independent of ADLs and IADLs.  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  May have had asbestos exposure 30 years ago.  Quit smoking cigarettes in 1965.  Smoked 1 pack/day for less than 12 years.  2 of his brothers had lung cancer and both were smokers.   PLAN:  1.  Metastatic squamous cell carcinoma, unknown primary: - We reviewed pathology reports from her right supraclavicular lymph node biopsy which showed poorly differentiated carcinoma consistent with squamous cell carcinoma. - PET scan (06/11/2022): Bulky right supraclavicular nodal mass, smaller hypermetabolic right posterior triangle and jugular  lymph nodes and single hypermetabolic left level 3 lymph node.  Bilateral hypermetabolic pulmonary nodules and metastatic pattern.  Minimal hypermetabolic mediastinal nodal metastasis.  Cluster of hypermetabolic right axillary lymph nodes.  No evidence of metastatic disease or primary lesion in the abdomen or pelvis.  No bone lesions. - Differential diagnosis for the primary includes head and neck cancer and squamous cell lung cancer.  He does not report any history of squamous cell skin cancer. - Recommended cancer type ID testing for tissue of origin. - Recommend NGS testing which may also indirectly help confirm the tissue of origin. - We will consider p16 testing if head and neck cancer confirmed. - I have considered ENT consult for panendoscopy, but held it of as he is not having any symptoms and the PET scan did not reveal any source of primary. - RTC 3 weeks for follow-up.  Orders placed this encounter:  No orders of the defined types were placed in this encounter.     Derek Jack, MD [place] [phone]

## 2022-06-17 NOTE — Patient Instructions (Addendum)
[place] at [facility] Discharge Instructions   You were seen and examined today by Dr. Delton Coombes.  He reviewed the results of your brain scan which is normal and no evidence of cancer.   He reviewed the results of your biopsy which shows a poorly differentiated squamous cell cancer. We will send special testing on your biopsy to try to determine the origin of the cancer. This test will also help guide our treatment plan for this cancer.   He reviewed the results of your PET scan which shows cancerous areas in multiple spots in your lungs and some lymph nodes under your right arm, and the lymph nodes about your collarbone on the right side.   Return as scheduled in 4 weeks to discuss results of the special testing.    Thank you for choosing [place] at [facility] to provide your oncology and hematology care.  To afford each patient quality time with our provider, please arrive at least 15 minutes before your scheduled appointment time.   If you have a lab appointment with the [facility] please come in thru the Main Entrance and check in at the main information desk.  You need to re-schedule your appointment should you arrive 10 or more minutes late.  We strive to give you quality time with our providers, and arriving late affects you and other patients whose appointments are after yours.  Also, if you no show three or more times for appointments you may be dismissed from the clinic at the providers discretion.     Again, thank you for choosing [facility].  Our hope is that these requests will decrease the amount of time that you wait before being seen by our physicians.       _____________________________________________________________  Should you have questions after your visit to [facility], please contact our office at [phone] and follow the prompts.  Our office hours are 8:00 a.m. and 4:30 p.m.  Monday - Friday.  Please note that voicemails left after 4:00 p.m. may not be returned until the following business day.  We are closed weekends and major holidays.  You do have access to a nurse 24-7, just call the main number to the clinic [phone] and do not press any options, hold on the line and a nurse will answer the phone.    For prescription refill requests, have your pharmacy contact our office and allow 72 hours.    Due to Covid, you will need to wear a mask upon entering the hospital. If you do not have a mask, a mask will be given to you at the Main Entrance upon arrival. For doctor visits, patients may have 1 support person age 63 or older with them. For treatment visits, patients can not have anyone with them due to social distancing guidelines and our immunocompromised population.

## 2022-06-19 NOTE — Progress Notes (Signed)
[place] ID testing ordered on [id number] per Dr. Delton Coombes

## 2022-06-25 ENCOUNTER — Encounter: Payer: Self-pay | Admitting: General Surgery

## 2022-06-25 ENCOUNTER — Ambulatory Visit (INDEPENDENT_AMBULATORY_CARE_PROVIDER_SITE_OTHER): Payer: Medicare Other | Admitting: General Surgery

## 2022-06-25 VITALS — BP 144/71 | HR 72 | Temp 98.3°F | Resp 12 | Ht 66.0 in | Wt 184.0 lb

## 2022-06-25 DIAGNOSIS — C801 Malignant (primary) neoplasm, unspecified: Secondary | ICD-10-CM

## 2022-06-25 DIAGNOSIS — R590 Localized enlarged lymph nodes: Secondary | ICD-10-CM

## 2022-06-25 NOTE — Patient Instructions (Signed)
Continue to follow up with Dr. Delton Coombes. The glue will continue to come off the incisions.  Diet and activity as tolerated.

## 2022-06-25 NOTE — Progress Notes (Unsigned)
[facility]  Soreness described in the neck. Minor bruising.   BP (!) 144/71   Pulse 72   Temp 98.3 F (36.8 C) (Oral)   Resp 12   Ht 5\' 6"  (1.676 m)   Wt 184 lb (83.5 kg)   SpO2 92%   BMI 29.70 kg/m  Right neck incision and port incision healing, glue peeling off. Bruising improving. Adenopathy in the posterior neck noted.   Patient s/p excisional biopsy of a lymph node and port placement. Pathology back with metastatic squamous cell cancer, primary etiology unknown at this point. Additional testing being sent.  Sent Oncology a message about getting his port flushed.  Continue to follow up with Dr. Ellin Saba. The glue will continue to come off the incisions.  Diet and activity as tolerated.     Algis Greenhouse, MD [facility] [address] [place], [place] [address] [phone] (office)

## 2022-06-26 DIAGNOSIS — C801 Malignant (primary) neoplasm, unspecified: Secondary | ICD-10-CM | POA: Diagnosis not present

## 2022-06-29 ENCOUNTER — Inpatient Hospital Stay: Payer: Medicare Other

## 2022-06-29 VITALS — BP 112/70 | HR 64 | Temp 97.3°F | Resp 18

## 2022-06-29 DIAGNOSIS — C801 Malignant (primary) neoplasm, unspecified: Secondary | ICD-10-CM

## 2022-06-29 DIAGNOSIS — R918 Other nonspecific abnormal finding of lung field: Secondary | ICD-10-CM | POA: Diagnosis not present

## 2022-06-29 DIAGNOSIS — Z87891 Personal history of nicotine dependence: Secondary | ICD-10-CM | POA: Diagnosis not present

## 2022-06-29 DIAGNOSIS — I10 Essential (primary) hypertension: Secondary | ICD-10-CM | POA: Diagnosis not present

## 2022-06-29 DIAGNOSIS — C771 Secondary and unspecified malignant neoplasm of intrathoracic lymph nodes: Secondary | ICD-10-CM | POA: Diagnosis not present

## 2022-06-29 DIAGNOSIS — Z801 Family history of malignant neoplasm of trachea, bronchus and lung: Secondary | ICD-10-CM | POA: Diagnosis not present

## 2022-06-29 MED ORDER — SODIUM CHLORIDE 0.9% FLUSH
10.0000 mL | INTRAVENOUS | Status: DC | PRN
  Administered 2022-06-29: 10 mL via INTRAVENOUS

## 2022-06-29 MED ORDER — HEPARIN SOD (PORK) LOCK FLUSH 100 UNIT/ML IV SOLN
500.0000 [IU] | Freq: Once | INTRAVENOUS | Status: AC
  Administered 2022-06-29: 500 [IU] via INTRAVENOUS

## 2022-07-01 DIAGNOSIS — C801 Malignant (primary) neoplasm, unspecified: Secondary | ICD-10-CM | POA: Diagnosis not present

## 2022-07-02 ENCOUNTER — Encounter (HOSPITAL_COMMUNITY): Payer: Self-pay

## 2022-07-02 ENCOUNTER — Encounter: Payer: Self-pay | Admitting: Family Medicine

## 2022-07-02 ENCOUNTER — Ambulatory Visit (INDEPENDENT_AMBULATORY_CARE_PROVIDER_SITE_OTHER): Payer: Medicare Other | Admitting: Family Medicine

## 2022-07-02 VITALS — BP 128/62 | HR 83 | Temp 97.3°F | Ht 66.0 in | Wt 184.0 lb

## 2022-07-02 DIAGNOSIS — Z7901 Long term (current) use of anticoagulants: Secondary | ICD-10-CM | POA: Diagnosis not present

## 2022-07-02 DIAGNOSIS — Z23 Encounter for immunization: Secondary | ICD-10-CM

## 2022-07-02 DIAGNOSIS — I4819 Other persistent atrial fibrillation: Secondary | ICD-10-CM | POA: Diagnosis not present

## 2022-07-02 LAB — COAGUCHEK XS/INR WAIVED
INR: 3.4 — ABNORMAL HIGH (ref 0.9–1.1)
Prothrombin Time: 40.4 s

## 2022-07-02 NOTE — Progress Notes (Signed)
BP 128/62   Pulse 83   Temp (!) 97.3 F (36.3 C)   Ht 5\' 6"  (1.676 m)   Wt 184 lb (83.5 kg)   SpO2 95%   BMI 29.70 kg/m    Subjective:   Patient ID: [name], male    DOB: Aug 01, 1932, 88 y.o.   MRN: [id number]  HPI: [name] is a 88 y.o. male presenting on 07/02/2022 for Medical Management of Chronic Issues and Atrial Fibrillation   HPI Coumadin recheck Target goal: 2.0-3.0 Reason on anticoagulation: A-fib persistent Patient denies any bruising or bleeding or chest pain or palpitations   Relevant past medical, surgical, family and social history reviewed and updated as indicated. Interim medical history since our last visit reviewed. Allergies and medications reviewed and updated.  Review of Systems  Constitutional:  Negative for chills and fever.  Eyes:  Negative for visual disturbance.  Respiratory:  Negative for shortness of breath and wheezing.   Cardiovascular:  Negative for chest pain and leg swelling.  Gastrointestinal:  Negative for blood in stool.  Genitourinary:  Negative for hematuria.  Musculoskeletal:  Negative for back pain and gait problem.  Skin:  Negative for rash.  All other systems reviewed and are negative.   Per HPI unless specifically indicated above   Allergies as of 07/02/2022       Reactions   Penicillins Hives, Rash   Has patient had a PCN reaction causing immediate rash, facial/tongue/throat swelling, SOB or lightheadedness with hypotension: Yes Has patient had a PCN reaction causing severe rash involving mucus membranes or skin necrosis: Yes Has patient had a PCN reaction that required hospitalization: No Has patient had a PCN reaction occurring within the last 10 years: No If all of the above answers are "NO", then may proceed with Cephalosporin use.   Hct [hydrochlorothiazide] Other (See Comments)   hyper   Statins Other (See Comments)   Myalgia, tolerates low dosages of lovastatin         Medication List         Accurate as of July 02, 2022  1:04 PM. If you have any questions, ask your nurse or doctor.          acetaminophen 500 MG tablet Commonly known as: TYLENOL Take 1,000 mg by mouth every 6 (six) hours as needed for moderate pain.   cetirizine-pseudoephedrine 5-120 MG tablet Commonly known as: ZYRTEC-D Take once per day as need for congestion/runny nose   diltiazem 120 MG 24 hr capsule Commonly known as: CARDIZEM CD Take one capsule daily   ezetimibe 10 MG tablet Commonly known as: ZETIA Take 1 tablet (10 mg total) by mouth daily.   finasteride 5 MG tablet Commonly known as: PROSCAR Take one tablet each day   fluticasone 50 MCG/ACT nasal spray Commonly known as: FLONASE Place 1 spray into both nostrils daily as needed for allergies or rhinitis.   furosemide 20 MG tablet Commonly known as: LASIX Take 1 tablet (20 mg total) by mouth daily.   lovastatin 20 MG tablet Commonly known as: MEVACOR TAKE 1/2 TABLET EVERY OTHER DAY   metoprolol tartrate 100 MG tablet Commonly known as: LOPRESSOR Take 1 tablet (100 mg total) by mouth 2 (two) times daily.   ondansetron 4 MG tablet Commonly known as: Zofran Take 1 tablet (4 mg total) by mouth every 8 (eight) hours as needed.   oxyCODONE 5 MG immediate release tablet Commonly known as: Roxicodone Take 1 tablet (5 mg total) by mouth  every 4 (four) hours as needed for breakthrough pain or severe pain.   SYSTANE OP Place 1 drop into both eyes 3 (three) times daily as needed (dry eyes).   Vitamin D3 125 MCG (5000 UT) Caps Take 5,000 Units by mouth once a week.   warfarin 5 MG tablet Commonly known as: COUMADIN Take as directed by the anticoagulation clinic. If you are unsure how to take this medication, talk to your nurse or doctor. Original instructions: TAKE 1 TABLET ON SAT, SUN, TUES, & THURSTAKE 1/2 TABLET ON MON, WED, & FRIDAY         Objective:   BP 128/62   Pulse 83   Temp (!) 97.3 F (36.3 C)   Ht 5\' 6"   (1.676 m)   Wt 184 lb (83.5 kg)   SpO2 95%   BMI 29.70 kg/m   Wt Readings from Last 3 Encounters:  07/02/22 184 lb (83.5 kg)  06/25/22 184 lb (83.5 kg)  06/17/22 181 lb 11.2 oz (82.4 kg)    Physical Exam Vitals and nursing note reviewed.  Constitutional:      Appearance: Normal appearance.  Skin:    Coloration: Skin is not pale.     Findings: No bruising or rash.  Neurological:     Mental Status: He is alert.       Assessment & Plan:   Problem List Items Addressed This Visit       Cardiovascular and Mediastinum   Atrial fibrillation, persistent ([place]) - Primary   Relevant Orders   CoaguChek XS/INR Waived     Other   Long term current use of anticoagulant therapy    Description   Hold 1 days dose and then decrease weekly dose by taking 1 tablet every day except for Mondays and Thursdays and Saturdays take 1/2 tablet  INR today is  3.4 (goal is 2-3)    Recheck in 4-6 weeks     Follow up plan: Return if symptoms worsen or fail to improve, for 4 to 6-week INR.  Counseling provided for all of the vaccine components Orders Placed This Encounter  Procedures   CoaguChek XS/INR Irwin Ladonte Verstraete, MD [facility] 07/02/2022, 1:04 PM

## 2022-07-03 DIAGNOSIS — C801 Malignant (primary) neoplasm, unspecified: Secondary | ICD-10-CM | POA: Diagnosis not present

## 2022-07-07 DIAGNOSIS — C801 Malignant (primary) neoplasm, unspecified: Secondary | ICD-10-CM | POA: Diagnosis not present

## 2022-07-08 DIAGNOSIS — C801 Malignant (primary) neoplasm, unspecified: Secondary | ICD-10-CM | POA: Diagnosis not present

## 2022-07-16 ENCOUNTER — Inpatient Hospital Stay: Payer: Medicare Other

## 2022-07-16 ENCOUNTER — Inpatient Hospital Stay: Payer: Medicare Other | Attending: Hematology | Admitting: Hematology

## 2022-07-16 VITALS — BP 150/91 | HR 74 | Temp 97.3°F | Resp 18 | Ht 66.0 in | Wt 186.3 lb

## 2022-07-16 DIAGNOSIS — I10 Essential (primary) hypertension: Secondary | ICD-10-CM | POA: Insufficient documentation

## 2022-07-16 DIAGNOSIS — Z801 Family history of malignant neoplasm of trachea, bronchus and lung: Secondary | ICD-10-CM | POA: Diagnosis not present

## 2022-07-16 DIAGNOSIS — Z5189 Encounter for other specified aftercare: Secondary | ICD-10-CM | POA: Diagnosis not present

## 2022-07-16 DIAGNOSIS — Z87891 Personal history of nicotine dependence: Secondary | ICD-10-CM | POA: Insufficient documentation

## 2022-07-16 DIAGNOSIS — R918 Other nonspecific abnormal finding of lung field: Secondary | ICD-10-CM | POA: Insufficient documentation

## 2022-07-16 DIAGNOSIS — C771 Secondary and unspecified malignant neoplasm of intrathoracic lymph nodes: Secondary | ICD-10-CM | POA: Insufficient documentation

## 2022-07-16 DIAGNOSIS — Z95828 Presence of other vascular implants and grafts: Secondary | ICD-10-CM | POA: Diagnosis not present

## 2022-07-16 DIAGNOSIS — Z5112 Encounter for antineoplastic immunotherapy: Secondary | ICD-10-CM | POA: Insufficient documentation

## 2022-07-16 DIAGNOSIS — Z85828 Personal history of other malignant neoplasm of skin: Secondary | ICD-10-CM | POA: Diagnosis not present

## 2022-07-16 DIAGNOSIS — Z79899 Other long term (current) drug therapy: Secondary | ICD-10-CM | POA: Insufficient documentation

## 2022-07-16 DIAGNOSIS — C760 Malignant neoplasm of head, face and neck: Secondary | ICD-10-CM | POA: Diagnosis not present

## 2022-07-16 DIAGNOSIS — Z5111 Encounter for antineoplastic chemotherapy: Secondary | ICD-10-CM | POA: Insufficient documentation

## 2022-07-16 DIAGNOSIS — C7989 Secondary malignant neoplasm of other specified sites: Secondary | ICD-10-CM | POA: Diagnosis not present

## 2022-07-16 DIAGNOSIS — C801 Malignant (primary) neoplasm, unspecified: Secondary | ICD-10-CM | POA: Diagnosis not present

## 2022-07-16 LAB — COMPREHENSIVE METABOLIC PANEL
ALT: 18 U/L (ref 0–44)
AST: 27 U/L (ref 15–41)
Albumin: 3.9 g/dL (ref 3.5–5.0)
Alkaline Phosphatase: 56 U/L (ref 38–126)
Anion gap: 7 (ref 5–15)
BUN: 21 mg/dL (ref 8–23)
CO2: 26 mmol/L (ref 22–32)
Calcium: 9 mg/dL (ref 8.9–10.3)
Chloride: 108 mmol/L (ref 98–111)
Creatinine, Ser: 0.98 mg/dL (ref 0.61–1.24)
GFR, Estimated: >60 mL/min (ref >60)
Glucose, Bld: 92 mg/dL (ref 70–99)
Potassium: 4.4 mmol/L (ref 3.5–5.1)
Sodium: 141 mmol/L (ref 135–145)
Total Bilirubin: 0.8 mg/dL (ref 0.3–1.2)
Total Protein: 7.3 g/dL (ref 6.5–8.1)

## 2022-07-16 NOTE — Progress Notes (Signed)
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  PRIOR THERAPY: None  NGS Results: Tp53 mutation positive, MSI-stable, TMB-low, PD-L1 (NL976): Negative  CURRENT THERAPY: Carboplatin, paclitaxel and pembrolizumab  BRIEF ONCOLOGIC HISTORY:  Oncology History  Squamous cell carcinoma of head and neck ([place])  07/16/2022 Initial Diagnosis   Squamous cell carcinoma of head and neck ([place])   07/23/2022 -  Chemotherapy   Patient is on Treatment Plan : Carboplatin  + Paclitaxel  + Pembrolizumab (200) D1 q21d       CANCER STAGING:  Cancer Staging  Squamous cell carcinoma of head and neck ([place]) Staging form: Cervical Lymph Nodes and Unknown Primary Tumors of the Head and Neck, AJCC 8th Edition - Clinical stage from 07/16/2022: Stage IVC (cT0, cN3b, cM1) - Unsigned    INTERVAL HISTORY:  Mr. Maslowski 88 y.o. male seen for follow-up of squamous cell carcinoma, unknown primary.  We have ordered NGS testing and canceled diabetic testing at prior visit.  He reports appetite of 100%.  Reports energy levels are low.  Thinks that his lymph node in the neck and right axilla is increasing in size.  He does not have any dysphagia or odynophagia.  He denies any tingling or numbness in the extremities.    REVIEW OF SYSTEMS:  Review of Systems  Neurological:  Positive for dizziness.  All other systems reviewed and are negative.    PAST MEDICAL/SURGICAL HISTORY:  Past Medical History:  Diagnosis Date   Anemia    Atrial fibrillation (HCC)    Cataract    Cellulitis    In past   Complication of anesthesia    HARD TIME WAKING; CAUSES MY BP TO GO UP    Coronary artery disease    5 bypasses   DJD (degenerative joint disease)    Ejection fraction < 50%    Mildly reduced, 40% by  echo   GERD (gastroesophageal reflux disease)    Hyperlipidemia    Hypertension    Persistent atrial fibrillation ([facility])    Prostate hypertrophy    on CT scan 09/2014   PUD (peptic ulcer disease)    RESOLVED    Skin cancer of eyelid    Resected   Past Surgical History:  Procedure Laterality Date   APPENDECTOMY     BYPASS GRAFT  2005   CATARACT EXTRACTION W/PHACO Left 07/22/2015   Procedure: CATARACT EXTRACTION PHACO AND INTRAOCULAR LENS PLACEMENT LEFT EYE CDE=8.14;  Surgeon: Tonny Branch, MD;  Location: [facility];  Service: Ophthalmology;  Laterality: Left;   CATARACT EXTRACTION W/PHACO Right 08/18/2019   Procedure: CATARACT EXTRACTION PHACO AND INTRAOCULAR LENS PLACEMENT (IOC);  Surgeon: Baruch Goldmann, MD;  Location: [facility];  Service: Ophthalmology;  Laterality: Right;  CDE: 9.74   CORONARY ARTERY BYPASS GRAFT  4/05   LIMA to LAD, SVG to PDA, SVG to ramus intermediate   EYE SURGERY  07/2015   EYELID CARCINOMA EXCISION     Skin cancer resected   Heart bypass  2005   LYMPH NODE BIOPSY Right 06/09/2022   Procedure: LYMPH NODE BIOPSY; supraclavicular;  Surgeon: Virl Cagey, MD;  Location: [facility];  Service: General;  Laterality: Right;   PORTACATH PLACEMENT Left 06/09/2022   Procedure: INSERTION PORT-A-CATH;  Surgeon: Virl Cagey, MD;  Location: [facility];  Service: General;  Laterality: Left;   TOTAL HIP ARTHROPLASTY     Right   TOTAL HIP ARTHROPLASTY Left 05/04/2018   Procedure: LEFT TOTAL HIP ARTHROPLASTY;  Surgeon: [name], MD;  Location: [facility];  Service: Orthopedics;  Laterality: Left;   TOTAL KNEE ARTHROPLASTY     Right     SOCIAL HISTORY:  Social History   Socioeconomic History   Marital status: Married    Spouse name: [name]   Number of children: 1   Years of education: 10   Highest education level: 10th grade  Occupational History   Occupation: retired  Tobacco Use   Smoking status: Former    Packs/day: 2.00    Years: 5.00    Total pack years:  10.00    Types: Cigarettes    Quit date: 12/23/1963    Years since quitting: 58.6   Smokeless tobacco: Never  Vaping Use   Vaping Use: Never used  Substance and Sexual Activity   Alcohol use: No   Drug use: No   Sexual activity: Yes  Other Topics Concern   Not on file  Social History Narrative   Lives in a split level home.   Social Determinants of Health   Financial Resource Strain: Low Risk  (12/11/2021)   Overall Financial Resource Strain (CARDIA)    Difficulty of Paying Living Expenses: Not hard at all  Food Insecurity: No Food Insecurity (12/11/2021)   Hunger Vital Sign    Worried About Running Out of Food in the Last Year: Never true    Ran Out of Food in the Last Year: Never true  Transportation Needs: No Transportation Needs (12/11/2021)   PRAPARE - Hydrologist (Medical): No    Lack of Transportation (Non-Medical): No  Physical Activity: Insufficiently Active (12/11/2021)   Exercise Vital Sign    Days of Exercise per Week: 7 days    Minutes of Exercise per Session: 20 min  Stress: No Stress Concern Present (12/11/2021)   Theodore    Feeling of Stress : Only a little  Recent Concern: Stress - Stress Concern Present (10/31/2021)   [place]z    Feeling of Stress : To some extent  Social Connections: Moderately Isolated (12/11/2021)   Social Connection and Isolation Panel [NHANES]    Frequency of Communication with Friends and Family: More than three times a week    Frequency of Social Gatherings with Friends and Family: More than three times a week    Attends Religious Services: Never    Marine scientist or Organizations: No    Attends Archivist Meetings: Never    Marital Status: Married  Human resources officer Violence: Not At Risk (12/11/2021)   Humiliation, Afraid, Rape, and Kick questionnaire    Fear of  Current or Ex-Partner: No    Emotionally Abused: No    Physically Abused: No    Sexually Abused: No    FAMILY HISTORY:  Family History  Problem Relation Age of Onset   Stroke Mother    Stroke Father    Hypertension Father    Early death Brother        57 months old   Cancer Brother        lung   Stroke Brother        heat    CURRENT MEDICATIONS:  Outpatient Encounter Medications as of 07/16/2022  Medication Sig  acetaminophen (TYLENOL) 500 MG tablet Take 1,000 mg by mouth every 6 (six) hours as needed for moderate pain.   cetirizine-pseudoephedrine (ZYRTEC-D) 5-120 MG tablet Take once per day as need for congestion/runny nose   Cholecalciferol (VITAMIN D3) 5000 units CAPS Take 5,000 Units by mouth once a week.    diltiazem (CARDIZEM CD) 120 MG 24 hr capsule Take one capsule daily   ezetimibe (ZETIA) 10 MG tablet Take 1 tablet (10 mg total) by mouth daily.   finasteride (PROSCAR) 5 MG tablet Take one tablet each day   fluticasone (FLONASE) 50 MCG/ACT nasal spray Place 1 spray into both nostrils daily as needed for allergies or rhinitis.   furosemide (LASIX) 20 MG tablet Take 1 tablet (20 mg total) by mouth daily.   lovastatin (MEVACOR) 20 MG tablet TAKE 1/2 TABLET EVERY OTHER DAY   metoprolol tartrate (LOPRESSOR) 100 MG tablet Take 1 tablet (100 mg total) by mouth 2 (two) times daily.   tobramycin (TOBREX) 0.3 % ophthalmic solution    warfarin (COUMADIN) 5 MG tablet TAKE 1 TABLET ON SAT, SUN, TUES, & THURSTAKE 1/2 TABLET ON MON, WED, & FRIDAY   [DISCONTINUED] ondansetron (ZOFRAN) 4 MG tablet Take 1 tablet (4 mg total) by mouth every 8 (eight) hours as needed.   [DISCONTINUED] oxyCODONE (ROXICODONE) 5 MG immediate release tablet Take 1 tablet (5 mg total) by mouth every 4 (four) hours as needed for breakthrough pain or severe pain.   [DISCONTINUED] Polyethyl Glycol-Propyl Glycol (SYSTANE OP) Place 1 drop into both eyes 3 (three) times daily as needed (dry eyes).    No  facility-administered encounter medications on file as of 07/16/2022.    ALLERGIES:  Allergies  Allergen Reactions   Penicillins Hives and Rash    Has patient had a PCN reaction causing immediate rash, facial/tongue/throat swelling, SOB or lightheadedness with hypotension: Yes Has patient had a PCN reaction causing severe rash involving mucus membranes or skin necrosis: Yes Has patient had a PCN reaction that required hospitalization: No Has patient had a PCN reaction occurring within the last 10 years: No If all of the above answers are "NO", then may proceed with Cephalosporin use.    Hct [Hydrochlorothiazide] Other (See Comments)    hyper   Statins Other (See Comments)    Myalgia, tolerates low dosages of lovastatin      PHYSICAL EXAM:  ECOG Performance status: 1  Vitals:   07/16/22 1157  BP: (!) 150/91  Pulse: 74  Resp: 18  Temp: (!) 97.3 F (36.3 C)  SpO2: 97%   Filed Weights   07/16/22 1157  Weight: 186 lb 4.6 oz (84.5 kg)   Physical Exam Vitals reviewed.  Constitutional:      Appearance: Normal appearance.  Cardiovascular:     Rate and Rhythm: Regular rhythm.     Heart sounds: Normal heart sounds.  Pulmonary:     Breath sounds: Normal breath sounds.  Neurological:     Mental Status: He is alert.  Psychiatric:        Mood and Affect: Mood normal.        Behavior: Behavior normal.    Neck and right axillary lymph nodes palpable.  LABORATORY DATA:  I have reviewed the labs as listed.  CBC    Component Value Date/Time   WBC 11.5 (H) 06/03/2022 1039   RBC 4.17 (L) 06/03/2022 1039   HGB 13.9 06/03/2022 1039   HGB 13.3 05/22/2022 0955   HCT 43.4 06/03/2022 1039   HCT 39.4 05/22/2022  0955   PLT 242 06/03/2022 1039   PLT 372 05/22/2022 0955   MCV 104.1 (H) 06/03/2022 1039   MCV 98 (H) 05/22/2022 0955   MCH 33.3 06/03/2022 1039   MCHC 32.0 06/03/2022 1039   RDW 13.2 06/03/2022 1039   RDW 11.6 05/22/2022 0955   LYMPHSABS 4.2 (H) 06/03/2022 1039    LYMPHSABS 5.1 (H) 05/22/2022 0955   MONOABS 0.6 06/03/2022 1039   EOSABS 0.3 06/03/2022 1039   EOSABS 0.3 05/22/2022 0955   BASOSABS 0.1 06/03/2022 1039   BASOSABS 0.1 05/22/2022 0955      Latest Ref Rng & Units 07/16/2022   12:50 PM 06/03/2022   10:39 AM 05/22/2022    9:55 AM  CMP  Glucose 70 - 99 mg/dL 92  104  122   BUN 8 - 23 mg/dL _0 Creatinine 0.61 - 1.24 mg/dL 0.98  1.05  1.11   Sodium 135 - 145 mmol/L 141  142  142   Potassium 3.5 - 5.1 mmol/L 4.4  4.2  4.4   Chloride 98 - 111 mmol/L 108  106  101   CO2 22 - 32 mmol/L _1 Calcium 8.9 - 10.3 mg/dL 9.0  9.2  9.4   Total Protein 6.5 - 8.1 g/dL 7.3  7.7  7.0   Total Bilirubin 0.3 - 1.2 mg/dL 0.8  1.1  0.6   Alkaline Phos 38 - 126 U/L 56  68  88   AST 15 - 41 U/L _2 ALT 0 - 44 U/L _3 DIAGNOSTIC IMAGING:  I have independently reviewed the scans and discussed with the patient.  ASSESSMENT: 1.  Right supraclavicular lymphadenopathy and multiple lung nodules: - Patient noticed right neck mass since Christmas 2022.  10 to 15 pound weight loss since January 2023.  Denies any dysphagia or odynophagia. - CT neck (05/27/2022): Numerous lymph nodes in the right neck, right supraclavicular lymph node mass measures 5.7 x 3.7 cm.  18 mm posterior lymph node in the right lower neck.  Numerous lymph nodes in the right lower and posterior neck approximately 1 cm.  Many have internal necrosis consistent with metastatic disease.  20 mm lymph node just above the right clavicle.  Left level 2 node 12 mm. - CT CAP (05/28/2022): Several bilateral lung nodules, RUL nodule measuring 1.8 x 1.3 cm.  Small clusters of nodules in the medial left upper lobe.  Irregular nodular density along the left side of the mediastinum measuring 2.1 cm.  No metastatic disease in the abdomen or pelvis.  Liver is slightly nodular contour. - Lab work shows elevated white count since 2009, predominantly lymphocytosis. - Pathology  (06/09/2022): Metastatic carcinoma positive for p40, p63 and CK5/6-patchy positivity with CK7.  Negative for TTF-1, Napsin A, PAX8, prostein, PSA, CK20 and CDX2.  - PET scan (06/11/2022): Bulky right supraclavicular nodal mass, smaller hypermetabolic right posterior triangle and jugular lymph nodes and single hypermetabolic left level 3 lymph node.  Bilateral hypermetabolic pulmonary nodules and metastatic pattern.  Minimal hypermetabolic mediastinal nodal metastasis.  Cluster of hypermetabolic right axillary lymph nodes.  No evidence of metastatic disease or primary lesion in the abdomen or pelvis.  No bone lesions. - MRI of the brain: Chronic small vessel ischemic changes with no evidence of metastatic disease. - NGS: T p53 pathogenic variant, MSI-stable, TMB-low, LOH-low, PD-L1 (OX735): Negative, p16 and p18 negative -  Cancer type ID: 96% probability squamous cell carcinoma, subtype head and neck/skin.  Cancer types ruled out with 95% confidence includes skin basal cell carcinoma.  2.  Social/family history: - He lives at home with his wife.  Independent of ADLs and IADLs.  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  May have had asbestos exposure 30 years ago.  Quit smoking cigarettes in 1965.  Smoked 1 pack/day for less than 12 years.  2 of his brothers had lung cancer and both were smokers.   PLAN:  1.  Metastatic squamous cell carcinoma, presumed head and neck primary: - I have reviewed NGS test results which did not show any targetable mutations. - I have reviewed cancer type ID results which showed 96% probability of squamous cell carcinoma of head and neck origin/skin. - He never had any skin cancers. - This is most likely head and neck origin.  He is very anxious to start treatment as he noticed that lymph nodes are increasing in size. - We discussed treatment options with platinum based regimen with pembrolizumab. - Options include carboplatin,  infusional 5-FU and pembrolizumab.  Given his advanced age, I would consider carboplatin, paclitaxel and pembrolizumab. - We discussed the weekly regimen versus every 3-week regimen.  Due to convenience we will do every 21-day regimen.  However given his advanced age, we will dose reduce. - We discussed chemotherapy regimen and side effects in detail.  Literature was given. - Will start him on next Thursday.  I will see him back 1 week after start of therapy for symptom management clinic. - Today he complained of severe fatigue.  We have checked his CMP to rule out hypercalcemia.  Calcium was normal.  Other labs were normal.  Orders placed this encounter:  Orders Placed This Encounter  Procedures   Comprehensive metabolic panel   CBC with Differential   Comprehensive metabolic panel   T4   TSH   CBC with Differential   Comprehensive metabolic panel   CBC with Differential   Comprehensive metabolic panel   T4   TSH   CBC with Differential   Comprehensive metabolic panel      Derek Jack, MD [place] [phone]

## 2022-07-16 NOTE — Progress Notes (Signed)
START OFF PATHWAY REGIMEN - Head and Neck   [id number]:Carboplatin AUC=5 IV D1 + Paclitaxel 175 mg/m2 IV D1 + Pembrolizumab 200 mg IV D1 q21 Days x 6 Cycles Followed by Pembrolizumab 200 mg IV D1 q21 Days:   Cycles 1 through 6: A cycle is every 21 days:     Pembrolizumab      Paclitaxel      Carboplatin    Cycles 7 and beyond: A cycle is every 21 days:     Pembrolizumab   **Always confirm dose/schedule in your pharmacy ordering system**  Patient Characteristics: Occult Primary, Metastatic, First Line, No Prior Platinum-Based Chemoradiation within 6 Months, PD-L1 Expression Negative (CPS < 1) / Unknown Disease Classification: Occult Primary Current Disease Status: Metastatic Disease AJCC T Category: T0 AJCC N Category: pN3b AJCC M Category: M1 AJCC Stage Grouping: IVC Line of Therapy: First Line Prior Platinum Status: No Prior Platinum-Based Chemoradiation within 6 Months PD-L1 Expression Status: PD-L1 Negative Intent of Therapy: Non-Curative / Palliative Intent, Discussed with Patient

## 2022-07-17 ENCOUNTER — Other Ambulatory Visit: Payer: Self-pay

## 2022-07-21 ENCOUNTER — Encounter (HOSPITAL_COMMUNITY): Payer: Self-pay | Admitting: Hematology

## 2022-07-22 ENCOUNTER — Inpatient Hospital Stay: Payer: Medicare Other

## 2022-07-22 ENCOUNTER — Encounter: Payer: Self-pay | Admitting: Hematology

## 2022-07-22 DIAGNOSIS — Z95828 Presence of other vascular implants and grafts: Secondary | ICD-10-CM | POA: Insufficient documentation

## 2022-07-22 DIAGNOSIS — C760 Malignant neoplasm of head, face and neck: Secondary | ICD-10-CM

## 2022-07-22 MED ORDER — LIDOCAINE-PRILOCAINE 2.5-2.5 % EX CREA: TOPICAL_CREAM | CUTANEOUS | 3 refills | Status: DC

## 2022-07-22 MED ORDER — PROCHLORPERAZINE MALEATE 10 MG PO TABS: 10.0000 mg | ORAL_TABLET | Freq: Four times a day (QID) | ORAL | 3 refills | Status: DC | PRN

## 2022-07-22 NOTE — Progress Notes (Signed)
Chemotherapy/immunotherapy education packet given and discussed with pt and family in detail.  Discussed diagnosis and staging, tx regimen, and intent of tx.  Reviewed chemotherapy and immunotherapy medications and side effects, as well as pre-medications.  Instructed on how to manage side effects at home, and when to call the clinic.  Importance of fever/chills discussed with pt and family. Discussed precautions to implement at home after receiving tx, as well as self care strategies. Phone numbers provided for clinic during regular working hours, also how to reach the clinic after hours and on weekends. Pt and family provided the opportunity to ask questions - all questions answered to pt's and family satisfaction.

## 2022-07-22 NOTE — Progress Notes (Signed)
Pharmacist Chemotherapy Monitoring - Initial Assessment    Anticipated start date: 07/23/22   The following has been reviewed per standard work regarding the patient's treatment regimen: The patient's diagnosis, treatment plan and drug doses, and organ/hematologic function Lab orders and baseline tests specific to treatment regimen  The treatment plan start date, drug sequencing, and pre-medications Prior authorization status  Patient's documented medication list, including drug-drug interaction screen and prescriptions for anti-emetics and supportive care specific to the treatment regimen The drug concentrations, fluid compatibility, administration routes, and timing of the medications to be used The patient's access for treatment and lifetime cumulative dose history, if applicable  The patient's medication allergies and previous infusion related reactions, if applicable   Changes made to treatment plan:  N/A  Follow up needed:  Needs home anti-emetics and Folic Acid.    Wynona Neat, RPH, 07/22/2022  11:28 AM

## 2022-07-22 NOTE — Patient Instructions (Addendum)
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  You will be treated in the clinic every 3 weeks with a combination of chemotherapy and immunotherapy drugs. Those drugs are paclitaxel (Taxol), carboplatin, and pembrolizumab (Keytruda).  You will receive all three drugs every 3 weeks for 4-6 cycles.  After that time you will come every 3 weeks to receive Keytruda only for maintenance therapy.  The intent of treatment is to shrink and control this cancer, prevent it from spreading further, and to alleviate any symptoms you may be having related to this disease.  You will see the doctor regularly throughout treatment.  We will obtain blood work from you prior to every treatment and monitor your results to make sure it is safe to give your treatment. The doctor monitors your response to treatment by the way you are feeling, your blood work, and by obtaining scans periodically.  There will be wait times while you are here for treatment.  It will take about 30 minutes to 1 hour for your lab work to result.  Then there will be wait times while pharmacy mixes your medications.      Medications you will receive in the clinic prior to your chemotherapy medications:   Aloxi:  ALOXI is used in adults to help prevent the nausea and vomiting that happens with certain chemotherapy drugs.  Aloxi is a long acting medication, and will remain in your system for about 2 days.    Dexamethasone:  This is a steroid given prior to chemotherapy to help prevent allergic reactions; it may also help prevent and control nausea and diarrhea.    Pepcid:  This medication is a histamine blocker that helps prevent and allergic reaction to your chemotherapy.    Benadryl:  This is a histamine blocker (different from the Pepcid) that helps prevent allergic/infusion reactions to your chemotherapy. This medication may cause  dizziness/drowsiness.       Paclitaxel (Taxol)     About This Drug Paclitaxel is a drug used to treat cancer. It is given in the vein (IV).  This will take 1 hour to infuse.  This first infusion will take longer because the rate is increased slowly every 15 minutes to monitor for reactions.  Your nurse will be in the room with you for the first 15 minutes of the first infusion.   Possible Side Effects    Hair loss. Hair loss is often temporary, although with certain medicine, hair loss can sometimes be permanent. Hair loss may happen suddenly or gradually. If you lose hair, you may lose it from your head, face, armpits, pubic area, chest, and/or legs. You may also notice your hair getting thin.    Swelling of your legs, ankles and/or feet (edema)    Flushing    Nausea and throwing up (vomiting)    Loose bowel movements (diarrhea)    Bone marrow depression. This is a decrease in the number of white blood cells, red blood cells, and platelets. This may raise your risk of infection, make you tired and weak (fatigue), and raise your risk of bleeding.    Effects on the nerves are called peripheral neuropathy. You may feel numbness, tingling, or pain in your hands and feet. It may be hard for you to button your clothes, open jars, or walk as usual. The effect on the nerves may get worse with more doses of the drug.  These effects get better in some people after the drug is stopped but it does not get better in all people.    Changes in your liver function    Bone, joint and muscle pain    Abnormal EKG    Allergic reaction: Allergic reactions, including anaphylaxis are rare but may happen in some patients. Signs of allergic reaction to this drug may be swelling of the face, feeling like your tongue or throat are swelling, trouble breathing, rash, itching, fever, chills, feeling dizzy, and/or feeling that your heart is beating in a fast or not normal way. If this happens, do not take another  dose of this drug. You should get urgent medical treatment.    Infection    Changes in your kidney function.   Note: Each of the side effects above was reported in 20% or greater of patients treated with paclitaxel. Not all possible side effects are included above.   Warnings and Precautions    Severe allergic reactions    Severe bone marrow depression   Treating Side Effects    To help with hair loss, wash with a mild shampoo and avoid washing your hair every day.    Avoid rubbing your scalp, instead, pat your hair or scalp dry    Avoid coloring your hair    Limit your use of hair spray, electric curlers, blow dryers, and curling irons.    If you are interested in getting a wig, talk to your nurse. You can also call the [facility] at 800-ACS-2345 to find out information about the "Look Good, Feel Better" program close to where you live. It is a free program where women getting chemotherapy can learn about wigs, turbans and scarves as well as makeup techniques and skin and nail care.    Ask your doctor or nurse about medicines that are available to help stop or lessen diarrhea and/or nausea.    To help with nausea and vomiting, eat small, frequent meals instead of three large meals a day. Choose foods and drinks that are at room temperature. Ask your nurse or doctor about other helpful tips and medicine that is available to help or stop lessen these symptoms.    If you get diarrhea, eat low-fiber foods that are high in protein and calories and avoid foods that can irritate your digestive tracts or lead to cramping. Ask your nurse or doctor about medicine that can lessen or stop your diarrhea.    Mouth care is very important. Your mouth care should consist of routine, gentle cleaning of your teeth or dentures and rinsing your mouth with a mixture of 1/2 teaspoon of salt in 8 ounces of water or  teaspoon of baking soda in 8 ounces of water. This should be done at least  after each meal and at bedtime.    If you have mouth sores, avoid mouthwash that has alcohol. Also avoid alcohol and smoking because they can bother your mouth and throat.    Drink plenty of fluids (a minimum of eight glasses per day is recommended).    Take your temperature as your doctor or nurse tells you, and whenever you feel like you may have a fever.    Talk to your doctor or nurse about precautions you can take to avoid infections and bleeding.    Be careful when cooking, walking, and handling sharp objects and hot liquids.   Food and Drug Interactions    There are no known interactions of paclitaxel  with food.    This drug may interact with other medicines. Tell your doctor and pharmacist about all the medicines and dietary supplements (vitamins, minerals, herbs and others) that you are taking at this time.    The safety and use of dietary supplements and alternative diets are often not known. Using these might affect your cancer or interfere with your treatment. Until more is known, you should not use dietary supplements or alternative diets without your cancer doctor's help.   When to Call the Doctor   Call your doctor or nurse if you have any of the following symptoms and/or any new or unusual symptoms:    Fever of 100.4 F (38 C) or above    Chills    Redness, pain, warmth, or swelling at the IV site during the infusion    Signs of allergic reaction: swelling of the face, feeling like your tongue or throat are swelling, trouble breathing, rash, itching, fever, chills, feeling dizzy, and/or feeling that your heart is beating in a fast or not normal way    Feeling that your heart is beating in a fast or not normal way (palpitations)    Weight gain of 5 pounds in one week (fluid retention)    Decreased urine or very dark urine    Signs of liver problems: dark urine, pale bowel movements, bad stomach pain, feeling very tired and weak, unusual  itching, or yellowing of  the eyes or skin    Heavy menstrual period that lasts longer than normal    Easy bruising or bleeding    Nausea that stops you from eating or drinking, and/or that is not relieved by prescribed medicines.    Loose bowel movements (diarrhea) more than 4 times a day or diarrhea with weakness or lightheadedness    Pain in your mouth or throat that makes it hard to eat or drink    Lasting loss of appetite or rapid weight loss of five pounds in a week    Signs of peripheral neuropathy: numbness, tingling, or decreased feeling in fingers or toes; trouble walking or changes in the way you walk; or feeling clumsy when buttoning clothes, opening jars, or other routine activities    Joint and muscle pain that is not relieved by prescribed medicines    Extreme fatigue that interferes with normal activities    While you are getting this drug, please tell your nurse right away if you have any pain, redness, or swelling at the site of the IV infusion.    If you think you are pregnant.   Reproduction Warnings    Pregnancy warning: This drug may have harmful effects on the unborn child, it is recommended that effective methods of birth control should be used during your cancer treatment. Let your doctor know right away if you think you may be pregnant.    Breast feeding warning: Women should not breast feed during treatment because this drug could enter the breastmilk and cause harm to a breast feeding baby.     Carboplatin (Paraplatin, CBDCA)   About This Drug   Carboplatin is used to treat cancer. It is given in the vein (IV).  It will take 30 minutes to infuse.    Possible Side Effects    Bone marrow suppression. This is a decrease in the number of white blood cells, red blood cells, and platelets. This may raise your risk of infection, make you tired and weak (fatigue), and raise your risk of bleeding.  Nausea and vomiting (throwing up)    Weakness    Changes in your liver  function    Changes in your kidney function    Electrolyte changes    Pain   Note: Each of the side effects above was reported in 20% or greater of patients treated with carboplatin. Not all possible side effects are included above.     Warnings and Precautions    Severe bone marrow suppression    Allergic reactions, including anaphylaxis are rare but may happen in some patients. Signs of allergic reaction to this drug may be swelling of the face, feeling like your tongue or throat are swelling, trouble breathing, rash, itching, fever, chills, feeling dizzy, and/or feeling that your heart is beating in a fast or not normal way. If this happens, do not take another dose of this drug. You should get urgent medical treatment.    Severe nausea and vomiting    Effects on the nerves are called peripheral neuropathy. This risk is increased if you are over the age of 71 or if you have received other medicine with risk of peripheral neuropathy. You may feel numbness, tingling, or pain in your hands and feet. It may be hard for you to button your clothes, open jars, or walk as usual. The effect on the nerves may get worse with more doses of the drug. These effects get better in some people after the drug is stopped but it does not get better in all people.    Blurred vision, loss of vision or other changes in eyesight    Decreased hearing    - Skin and tissue irritation including redness, pain, warmth, or swelling at the IV site if the drug leaks out of the vein and into nearby tissue.    Severe changes in your kidney function, which can cause kidney failure    Severe changes in your liver function, which can cause liver failure   Note: Some of the side effects above are very rare. If you have concerns and/or questions, please discuss them with your medical team.     Important Information    This drug may be present in the saliva, tears, sweat, urine, stool, vomit, semen, and vaginal  secretions. Talk to your doctor and/or your nurse about the necessary precautions to take during this time.     Treating Side Effects    Manage tiredness by pacing your activities for the day.    Be sure to include periods of rest between energy-draining activities.    To decrease the risk of infection, wash your hands regularly.    Avoid close contact with people who have a cold, the flu, or other infections.    Take your temperature as your doctor or nurse tells you, and whenever you feel like you may have a fever.    To help decrease the risk of bleeding, use a soft toothbrush. Check with your nurse before using dental floss.    Be very careful when using knives or tools.    Use an electric shaver instead of a razor.    Drink plenty of fluids (a minimum of eight glasses per day is recommended).    If you throw up or have loose bowel movements, you should drink more fluids so that you do not become dehydrated (lack of water in the body from losing too much fluid).    To help with nausea and vomiting, eat small, frequent meals instead of three large meals a  day. Choose foods and drinks that are at room temperature. Ask your nurse or doctor about other helpful tips and medicine that is available to help stop or lessen these symptoms.    If you have numbness and tingling in your hands and feet, be careful when cooking, walking, and handling sharp objects and hot liquids.    Keeping your pain under control is important to your well-being. Please tell your doctor or nurse if you are experiencing pain.     Food and Drug Interactions    There are no known interactions of carboplatin with food.    This drug may interact with other medicines. Tell your doctor and pharmacist about all the prescription and over-the-counter medicines and dietary supplements (vitamins, minerals, herbs and others) that you are taking at this time. Also, check with your doctor or pharmacist before starting any  new prescription or over-the-counter medicines, or dietary supplements to make sure that there are no interactions.     When to Call the Doctor   Call your doctor or nurse if you have any of these symptoms and/or any new or unusual symptoms:    Fever of 100.4 F (38 C) or higher    Chills    Tiredness that interferes with your daily activities    Feeling dizzy or lightheaded    Easy bleeding or bruising    Nausea that stops you from eating or drinking and/or is not relieved by prescribed medicines    Throwing up    Blurred vision or other changes in eyesight    Decrease in hearing or ringing in the ear    Signs of allergic reaction: swelling of the face, feeling like your tongue or throat are swelling, trouble breathing, rash, itching, fever, chills, feeling dizzy, and/or feeling that your heart is beating in a fast or not normal way. If this happens, call 911 for emergency care.    Signs of possible liver problems: dark urine, pale bowel movements, bad stomach pain, feeling very tired and weak, unusual itching, or yellowing of the eyes or skin    Decreased urine, or very dark urine    Numbness, tingling, or pain in your hands and feet    Pain that does not go away or is not relieved by prescribed medicine    While you are getting this drug, please tell your nurse right away if you have any pain, redness, or swelling at the site of the IV infusion, or if you have any new onset of symptoms, or if you just feel "different" from before when the infusion was started.     Reproduction Warnings    Pregnancy warning: This drug may have harmful effects on the unborn baby. Women of child bearing potential should use effective methods of birth control during your cancer treatment. Let your doctor know right away if you think you may be pregnant.    Breastfeeding warning: It is not known if this drug passes into breast milk. For this reason, women should not breastfeed during  treatment because this drug could enter the breast milk and cause harm to a breastfeeding baby.    Fertility warning: Human fertility studies have not been done with this drug. Talk with your doctor or nurse if you plan to have children. Ask for information on sperm or egg banking.     Pembrolizumab Beryle Flock)   About This Drug Pembrolizumab is used to treat cancer. It is given in the vein (IV).  This drug will take  30 minutes to infuse.   Possible Side Effects  Tiredness  Fever  Nausea  Decreased appetite (decreased hunger)  Loose bowel movements (diarrhea)  Constipation (not able to move bowels)  Trouble breathing  Rash  Itching  Muscle and bone pain  Cough   Note: Each of the side effects above was reported in 20% or greater of patients treated with pembrolizumab. Not all possible side effects are included above.   Warnings and Precautions    This drug works with your immune system and can cause inflammation in any of your organs and tissues and can change how they work. This may put you at risk for developing serious medical problems which can very rarely be fatal.    Colitis (swelling or inflammation in the colon) - symptoms are loose bowel movements (diarrhea) stomach cramping, and sometimes blood in the stool    Changes in liver function. Your liver function will be checked as needed.    Changes in kidney function, which can very rarely be fatal. Your kidney function will be checked as needed.    Inflammation (swelling) of the lungs which can very rarely be fatal - you may have a dry cough or trouble breathing.    This drug may affect some of your hormone glands (especially the thyroid, adrenals, pituitary and pancreas). Your hormone levels will be checked as needed.    Blood sugar levels may change and you may develop diabetes. If you already have diabetes, changes may need to be made to your diabetes medication.    Severe allergic skin reaction, which can very  rarely be fatal. You may develop blisters on your skin that are filled with fluid or a severe red rash all over your body that may be painful.    Increased risk of organ rejection in patients who have received donor organs    Increased risk of complications in patients who will undergo a stem cell transplant after receiving pembrolizumab.    While you are getting this drug in your vein (IV), you may have a reaction to the drug. Your nurse will check you closely for these signs: fever or shaking chills, flushing, facial swelling, feeling dizzy, headache, trouble breathing, rash, itching, chest tightness, or chest pain. These reactions may occur after your infusion. If this happens, call 911 for emergency care.   Important Information This drug may be present in the saliva, tears, sweat, urine, stool, vomit, semen, and vaginal secretions. Talk to your doctor and/or your nurse about the necessary precautions to take during this time.   Treating Side Effects    Ask your doctor or nurse about medicines that are available to help stop or lessen constipation, diarrhea and/or nausea.    Drink plenty of fluids (a minimum of eight glasses per day is recommended).    If you are not able to move your bowels, check with your doctor or nurse before you use any enemas, laxatives, or suppositories    To help with nausea and vomiting, eat small, frequent meals instead of three large meals a day. Choose foods and drinks that are at room temperature. Ask your nurse or doctor about other helpful tips and medicine that is available to help or stop lessen these symptoms.    If you get diarrhea, eat low-fiber foods that are high in protein and calories and avoid foods that can irritate your digestive tracts or lead to cramping. Ask your nurse or doctor about medicine that can lessen or stop your diarrhea.  Manage tiredness by pacing your activities for the day. Be sure to include periods of rest between  energy-draining activities    Keeping your pain under control is important to your wellbeing. Please tell your doctor or nurse if you are experiencing pain.    If you have diabetes, keep good control of your blood sugar level. Tell your nurse or your doctor if your glucose levels are higher or lower than normal    If you get a rash do not put anything on it unless your doctor or nurse says you may. Keep the area around the rash clean and dry. Ask your doctor for medicine if your rash bothers you.    Infusion reactions may happen for 24 hours after your infusion. If this happens, call 911 for emergency care.   Food and Drug Interactions    There are no known interactions of pembrolizumab with food.    There are no known interactions of pembrolizumab with other medications.    Tell your doctor and pharmacist about all the medicines and dietary supplements (vitamins, minerals, herbs and others) that you are taking at this time. The safety and use of dietary supplements and alternative agents are often not known. Using these might affect your cancer or interfere with your treatment. Until more is known, you should not use dietary supplements or alternative agents without your cancer doctor's help.     When to Call the Doctor Call your doctor or nurse if you have any of the following symptoms and/or any new or unusual symptoms:    Fever of 100.4 F (38 C) or higher    Chills    Wheezing or trouble breathing    Rash or itching    Feeling dizzy or lightheaded    Loose bowel movements (diarrhea) more than 4 times a day or diarrhea with weakness or lightheadedness, or diarrhea that is not controlled by medications    Nausea that stops you from eating or drinking, and/or that is not relieved by prescribed medicines    Lasting loss of appetite or rapid weight loss of five pounds in a week    Fatigue that interferes with your daily activities    No bowel movement for 3 days or you feel  uncomfortable    Extreme weakness that interferes with normal activities    Bad abdominal pain, especially in upper right area    Decreased urine    Unusual thirst or passing urine often    Rash that is not relieved by prescribed medicines    Flu-like symptoms: fever, headache, muscle and joint aches, and fatigue (low energy, feeling weak)    Signs of liver problems: dark urine, pale bowel movements, bad stomach pain, feeling very tired and weak, unusual itching, or yellowing of the eyes or skin    Signs of infusion reactions such as fever or shaking chills, flushing, facial swelling, feeling dizzy, headache, trouble breathing, rash, itching, chest tightness, or chest pain.     Reproduction Warnings    Pregnancy warning: This drug may have harmful effects on the unborn baby. Women of childbearing potential should use effective methods of birth control during your cancer treatment and for at least 4 months after treatment. Let your doctor know right away if you think you may be pregnant    Breast feeding warning: It is not known if this drug passes into breast milk. It is recommended that women do not breastfeed during treatment and for 4 months after treatment.  Fertility warning: Human fertility studies have not been done with this drug. Talk with your doctor or nurse if you plan to have children.         SELF CARE ACTIVITIES WHILE RECEIVING CHEMOTHERAPY:   Hydration Increase your fluid intake and drink at least 8 to 12 cups (64 ounces) of water/decaffeinated beverages per day after treatment. You can still have your cup of coffee or soda but these beverages do not count as part of your 8 to 12 cups that you need to drink daily. No alcohol intake.   Medications Continue taking your normal prescription medication as prescribed.  If you start any new herbal or new supplements please let us know first to make sure it is safe.   Mouth Care Have teeth cleaned professionally  before starting treatment. Keep dentures and partial plates clean. Use soft toothbrush and do not use mouthwashes that contain alcohol. Biotene is a good mouthwash that is available at most pharmacies or may be ordered by calling [phone]. Use warm salt water gargles (1 teaspoon salt per 1 quart warm water) before and after meals and at bedtime. If you need dental work, please let the doctor know before you go for your appointment so that we can coordinate the best possible time for you in regards to your chemo regimen. You need to also let your dentist know that you are actively taking chemo. We may need to do labs prior to your dental appointment.   Skin Care Always use sunscreen that has not expired and with SPF (Sun Protection Factor) of 50 or higher. Wear hats to protect your head from the sun. Remember to use sunscreen on your hands, ears, face, & feet.  Use good moisturizing lotions such as udder cream, eucerin, or even Vaseline. Some chemotherapies can cause dry skin, color changes in your skin and nails.     Avoid long, hot showers or baths. Use gentle, fragrance-free soaps and laundry detergent. Use moisturizers, preferably creams or ointments rather than lotions because the thicker consistency is better at preventing skin dehydration. Apply the cream or ointment within 15 minutes of showering. Reapply moisturizer at night, and moisturize your hands every time after you wash them.   Hair Loss (if your doctor says your hair will fall out)   If your doctor says that your hair is likely to fall out, decide before you begin chemo whether you want to wear a wig. You may want to shop before treatment to match your hair color. Hats, turbans, and scarves can also camouflage hair loss, although some people prefer to leave their heads uncovered. If you go bare-headed outdoors, be sure to use sunscreen on your scalp. Cut your hair short. It eases the inconvenience of shedding lots of hair, but it  also can reduce the emotional impact of watching your hair fall out. Don't perm or color your hair during chemotherapy. Those chemical treatments are already damaging to hair and can enhance hair loss. Once your chemo treatments are done and your hair has grown back, it's OK to resume dyeing or perming hair.   With chemotherapy, hair loss is almost always temporary. But when it grows back, it may be a different color or texture. In older adults who still had hair color before chemotherapy, the new growth may be completely gray.  Often, new hair is very fine and soft.   Infection Prevention Please wash your hands for at least 30 seconds using warm soapy water. Handwashing is the #  1 way to prevent the spread of germs. Stay away from sick people or people who are getting over a cold. If you develop respiratory systems such as green/yellow mucus production or productive cough or persistent cough let us know and we will see if you need an antibiotic. It is a good idea to keep a pair of gloves on when going into grocery stores/[employer] to decrease your risk of coming into contact with germs on the carts, etc. Carry alcohol hand gel with you at all times and use it frequently if out in public. If your temperature reaches 100.4 or higher please call the clinic and let us know.  If it is after hours or on the weekend please go to the ER if your temperature is over 100.4.  Please have your own personal thermometer at home to use.     Sex and bodily fluids If you are going to have sex, a condom must be used to protect the person that isn't taking chemotherapy. Chemo can decrease your libido (sex drive). For a few days after chemotherapy, chemotherapy can be excreted through your bodily fluids.  When using the toilet please close the lid and flush the toilet twice.  Do this for a few day after you have had chemotherapy.    Effects of chemotherapy on your sex life Some changes are simple and won't last long. They  won't affect your sex life permanently.   Sometimes you may feel: too tired not strong enough to be very active sick or sore  not in the mood anxious or low   Your anxiety might not seem related to sex. For example, you may be worried about the cancer and how your treatment is going. Or you may be worried about money, or about how you family are coping with your illness.  These things can cause stress, which can affect your interest in sex. It's important to talk to your partner about how you feel.  Remember - the changes to your sex life don't usually last long. There's usually no medical reason to stop having sex during chemo. The drugs won't have any long term physical effects on your performance or enjoyment of sex. Cancer can't be passed on to your partner during sex   Contraception It's important to use reliable contraception during treatment. Avoid getting pregnant while you or your partner are having chemotherapy. This is because the drugs may harm the baby. Sometimes chemotherapy drugs can leave a man or woman infertile.  This means you would not be able to have children in the future. You might want to talk to someone about permanent infertility. It can be very difficult to learn that you may no longer be able to have children. Some people find counselling helpful. There might be ways to preserve your fertility, although this is easier for men than for women. You may want to speak to a fertility expert. You can talk about sperm banking or harvesting your eggs. You can also ask about other fertility options, such as donor eggs. If you have or have had breast cancer, your doctor might advise you not to take the contraceptive pill. This is because the hormones in it might affect the cancer. It is not known for sure whether or not chemotherapy drugs can be passed on through semen or secretions from the vagina. Because of this some doctors advise people to use a barrier method if you have sex during  treatment. This applies to vaginal, anal or oral  sex. Generally, doctors advise a barrier method only for the time you are actually having the treatment and for about a week after your treatment. Advice like this can be worrying, but this does not mean that you have to avoid being intimate with your partner. You can still have close contact with your partner and continue to enjoy sex.   Animals If you have cats or birds we just ask that you not change the litter or change the cage.  Please have someone else do this for you while you are on chemotherapy.    Food Safety During and After Cancer Treatment Food safety is important for people both during and after cancer treatment. Cancer and cancer treatments, such as chemotherapy, radiation therapy, and stem cell/bone marrow transplantation, often weaken the immune system. This makes it harder for your body to protect itself from foodborne illness, also called food poisoning. Foodborne illness is caused by eating food that contains harmful bacteria, parasites, or viruses.   Foods to avoid Some foods have a higher risk of becoming tainted with bacteria. These include: Unwashed fresh fruit and vegetables, especially leafy vegetables that can hide dirt and other contaminants Raw sprouts, such as alfalfa sprouts Raw or undercooked beef, especially ground beef, or other raw or undercooked meat and poultry Fatty, fried, or spicy foods immediately before or after treatment.  These can sit heavy on your stomach and make you feel nauseous. Raw or undercooked shellfish, such as oysters. Sushi and sashimi, which often contain raw fish.  Unpasteurized beverages, such as unpasteurized fruit juices, raw milk, raw yogurt, or cider Undercooked eggs, such as soft boiled, over easy, and poached; raw, unpasteurized eggs; or foods made with raw egg, such as homemade raw cookie dough and homemade mayonnaise   Simple steps for food safety   Shop smart. Do not buy food  stored or displayed in an unclean area. Do not buy bruised or damaged fruits or vegetables. Do not buy cans that have cracks, dents, or bulges. Pick up foods that can spoil at the end of your shopping trip and store them in a cooler on the way home.   Prepare and clean up foods carefully. Rinse all fresh fruits and vegetables under running water, and dry them with a clean towel or paper towel. Clean the top of cans before opening them. After preparing food, wash your hands for 20 seconds with hot water and soap. Pay special attention to areas between fingers and under nails. Clean your utensils and dishes with hot water and soap. Disinfect your kitchen and cutting boards using 1 teaspoon of liquid, unscented bleach mixed into 1 quart of water.     Dispose of old food. Eat canned and packaged food before its expiration date (the "use by" or "best before" date). Consume refrigerated leftovers within 3 to 4 days. After that time, throw out the food. Even if the food does not smell or look spoiled, it still may be unsafe. Some bacteria, such as Listeria, can grow even on foods stored in the refrigerator if they are kept for too long.   Take precautions when eating out. At restaurants, avoid buffets and salad bars where food sits out for a long time and comes in contact with many people. Food can become contaminated when someone with a virus, often a norovirus, or another "bug" handles it. Put any leftover food in a "to-go" container yourself, rather than having the server do it. And, refrigerate leftovers as soon as you get  home. Choose restaurants that are clean and that are willing to prepare your food as you order it cooked.     AT HOME MEDICATIONS:                                                                                                                                                                 Compazine/Prochlorperazine 10mg  tablet. Take 1 tablet every 6 hours as needed for  nausea/vomiting. (This can make you sleepy)     EMLA cream. Apply a quarter size amount to port site 1 hour prior to chemo. Do not rub in. Cover with plastic wrap.       Diarrhea Sheet    If you are having loose stools/diarrhea, please purchase Imodium and begin taking as outlined:  At the first sign of poorly formed or loose stools you should begin taking Imodium (loperamide) 2 mg capsules.  Take two tablets (4mg ) followed by one tablet (2mg ) every 2 hours - DO NOT EXCEED 8 tablets in 24 hours.  If it is bedtime and you are having loose stools, take 2 tablets at bedtime, then 2 tablets every 4 hours until morning.    Always call the [facility] if you are having loose stools/diarrhea that you can't get under control.  Loose stools/diarrhea leads to dehydration (loss of water) in your body.  We have other options of trying to get the loose stools/diarrhea to stop but you must let us know!     Constipation Sheet   Colace - 100 mg capsules - take 2 capsules daily.  If this doesn't help then you can increase to 2 capsules twice daily.  Please call if the above does not work for you. Do not go more than 2 days without a bowel movement.  It is very important that you do not become constipated.  It will make you feel sick to your stomach (nausea) and can cause abdominal pain and vomiting.   Nausea Sheet    Compazine/Prochlorperazine 10mg  tablet. Take 1 tablet every 6 hours as needed for nausea/vomiting (This can make you drowsy).   If you are having persistent nausea (nausea that does not stop) please call the [facility] and let us know the amount of nausea that you are experiencing.  If you begin to vomit, you need to call the [facility] and if it is the weekend and you have vomited more than one time and can't get it to stop-go to the Emergency Room.  Persistent nausea/vomiting can lead to dehydration (loss of fluid in your body) and will make you feel very weak and unwell. Ice chips, sips  of clear liquids, foods that are at room temperature, crackers, and toast tend to be better tolerated.     SYMPTOMS TO REPORT  AS SOON AS POSSIBLE AFTER TREATMENT:   FEVER GREATER THAN 100.4 F   CHILLS WITH OR WITHOUT FEVER   NAUSEA AND VOMITING THAT IS NOT CONTROLLED WITH YOUR NAUSEA MEDICATION   UNUSUAL SHORTNESS OF BREATH   UNUSUAL BRUISING OR BLEEDING   TENDERNESS IN MOUTH AND THROAT WITH OR WITHOUT PRESENCE OF ULCERS   URINARY PROBLEMS   BOWEL PROBLEMS   UNUSUAL RASH         Wear comfortable clothing and clothing appropriate for easy access to any Portacath or PICC line. Let us know if there is anything that we can do to make your therapy better!       What to do if you need assistance after hours or on the weekends: CALL [phone].  HOLD on the line, do not hang up.  You will hear multiple messages but at the end you will be connected with a nurse triage line.  They will contact the doctor if necessary.  Most of the time they will be able to assist you.  Do not call the hospital operator.         I have been informed and understand all of the instructions given to me and have received a copy. I have been instructed to call the clinic [phone] or my family physician as soon as possible for continued medical care, if indicated. I do not have any more questions at this time but understand that I may call the [facility] or the Patient Navigator at [phone] during office hours should I have questions or need assistance in obtaining follow-up care.

## 2022-07-23 ENCOUNTER — Inpatient Hospital Stay: Payer: Medicare Other

## 2022-07-23 VITALS — BP 150/98 | HR 88 | Temp 96.9°F | Resp 18

## 2022-07-23 DIAGNOSIS — C760 Malignant neoplasm of head, face and neck: Secondary | ICD-10-CM

## 2022-07-23 DIAGNOSIS — Z87891 Personal history of nicotine dependence: Secondary | ICD-10-CM | POA: Diagnosis not present

## 2022-07-23 DIAGNOSIS — R918 Other nonspecific abnormal finding of lung field: Secondary | ICD-10-CM | POA: Diagnosis not present

## 2022-07-23 DIAGNOSIS — Z85828 Personal history of other malignant neoplasm of skin: Secondary | ICD-10-CM | POA: Diagnosis not present

## 2022-07-23 DIAGNOSIS — Z5111 Encounter for antineoplastic chemotherapy: Secondary | ICD-10-CM | POA: Diagnosis not present

## 2022-07-23 DIAGNOSIS — Z5189 Encounter for other specified aftercare: Secondary | ICD-10-CM | POA: Diagnosis not present

## 2022-07-23 DIAGNOSIS — I10 Essential (primary) hypertension: Secondary | ICD-10-CM | POA: Diagnosis not present

## 2022-07-23 DIAGNOSIS — Z95828 Presence of other vascular implants and grafts: Secondary | ICD-10-CM

## 2022-07-23 DIAGNOSIS — Z801 Family history of malignant neoplasm of trachea, bronchus and lung: Secondary | ICD-10-CM | POA: Diagnosis not present

## 2022-07-23 DIAGNOSIS — C771 Secondary and unspecified malignant neoplasm of intrathoracic lymph nodes: Secondary | ICD-10-CM | POA: Diagnosis not present

## 2022-07-23 DIAGNOSIS — Z79899 Other long term (current) drug therapy: Secondary | ICD-10-CM | POA: Diagnosis not present

## 2022-07-23 DIAGNOSIS — Z5112 Encounter for antineoplastic immunotherapy: Secondary | ICD-10-CM | POA: Diagnosis not present

## 2022-07-23 LAB — CBC WITH DIFFERENTIAL/PLATELET
Abs Immature Granulocytes: 0.04 10*3/uL (ref 0.00–0.07)
Basophils Absolute: 0.1 10*3/uL (ref 0.0–0.1)
Basophils Relative: 1 %
Eosinophils Absolute: 0.7 10*3/uL — ABNORMAL HIGH (ref 0.0–0.5)
Eosinophils Relative: 6 %
HCT: 40 % (ref 39.0–52.0)
Hemoglobin: 12.8 g/dL — ABNORMAL LOW (ref 13.0–17.0)
Immature Granulocytes: 0 %
Lymphocytes Relative: 31 %
Lymphs Abs: 3.7 10*3/uL (ref 0.7–4.0)
MCH: 32.9 pg (ref 26.0–34.0)
MCHC: 32 g/dL (ref 30.0–36.0)
MCV: 102.8 fL — ABNORMAL HIGH (ref 80.0–100.0)
Monocytes Absolute: 0.8 10*3/uL (ref 0.1–1.0)
Monocytes Relative: 7 %
Neutro Abs: 6.6 10*3/uL (ref 1.7–7.7)
Neutrophils Relative %: 55 %
Platelets: 206 10*3/uL (ref 150–400)
RBC: 3.89 MIL/uL — ABNORMAL LOW (ref 4.22–5.81)
RDW: 13.2 % (ref 11.5–15.5)
WBC: 12 10*3/uL — ABNORMAL HIGH (ref 4.0–10.5)
nRBC: 0 % (ref 0.0–0.2)

## 2022-07-23 LAB — COMPREHENSIVE METABOLIC PANEL
ALT: 16 U/L (ref 0–44)
AST: 25 U/L (ref 15–41)
Albumin: 3.6 g/dL (ref 3.5–5.0)
Alkaline Phosphatase: 49 U/L (ref 38–126)
Anion gap: 4 — ABNORMAL LOW (ref 5–15)
BUN: 19 mg/dL (ref 8–23)
CO2: 27 mmol/L (ref 22–32)
Calcium: 8.9 mg/dL (ref 8.9–10.3)
Chloride: 109 mmol/L (ref 98–111)
Creatinine, Ser: 0.94 mg/dL (ref 0.61–1.24)
GFR, Estimated: >60 mL/min (ref >60)
Glucose, Bld: 100 mg/dL — ABNORMAL HIGH (ref 70–99)
Potassium: 3.8 mmol/L (ref 3.5–5.1)
Sodium: 140 mmol/L (ref 135–145)
Total Bilirubin: 0.7 mg/dL (ref 0.3–1.2)
Total Protein: 6.6 g/dL (ref 6.5–8.1)

## 2022-07-23 LAB — TSH: TSH: 1.08 u[IU]/mL (ref 0.350–4.500)

## 2022-07-23 MED ORDER — DIPHENHYDRAMINE HCL 50 MG/ML IJ SOLN
50.0000 mg | Freq: Once | INTRAMUSCULAR | Status: AC
  Administered 2022-07-23: 50 mg via INTRAVENOUS
  Filled 2022-07-23: qty 1

## 2022-07-23 MED ORDER — SODIUM CHLORIDE 0.9% FLUSH
10.0000 mL | INTRAVENOUS | Status: DC | PRN
  Administered 2022-07-23: 10 mL

## 2022-07-23 MED ORDER — SODIUM CHLORIDE 0.9 % IV SOLN: Freq: Once | INTRAVENOUS | Status: AC

## 2022-07-23 MED ORDER — SODIUM CHLORIDE 0.9 % IV SOLN
125.0000 mg/m2 | Freq: Once | INTRAVENOUS | Status: AC
  Administered 2022-07-23: 246 mg via INTRAVENOUS
  Filled 2022-07-23: qty 41

## 2022-07-23 MED ORDER — SODIUM CHLORIDE 0.9 % IV SOLN
200.0000 mg | Freq: Once | INTRAVENOUS | Status: AC
  Administered 2022-07-23: 200 mg via INTRAVENOUS
  Filled 2022-07-23: qty 8

## 2022-07-23 MED ORDER — HEPARIN SOD (PORK) LOCK FLUSH 100 UNIT/ML IV SOLN
500.0000 [IU] | Freq: Once | INTRAVENOUS | Status: AC | PRN
  Administered 2022-07-23: 500 [IU]

## 2022-07-23 MED ORDER — SODIUM CHLORIDE 0.9 % IV SOLN
10.0000 mg | Freq: Once | INTRAVENOUS | Status: AC
  Administered 2022-07-23: 10 mg via INTRAVENOUS
  Filled 2022-07-23: qty 10

## 2022-07-23 MED ORDER — SODIUM CHLORIDE 0.9 % IV SOLN
150.0000 mg | Freq: Once | INTRAVENOUS | Status: AC
  Administered 2022-07-23: 150 mg via INTRAVENOUS
  Filled 2022-07-23: qty 150

## 2022-07-23 MED ORDER — SODIUM CHLORIDE 0.9 % IV SOLN
305.2000 mg | Freq: Once | INTRAVENOUS | Status: AC
  Administered 2022-07-23: 310 mg via INTRAVENOUS
  Filled 2022-07-23: qty 31

## 2022-07-23 MED ORDER — FAMOTIDINE IN NACL 20-0.9 MG/50ML-% IV SOLN
20.0000 mg | Freq: Once | INTRAVENOUS | Status: AC
  Administered 2022-07-23: 20 mg via INTRAVENOUS
  Filled 2022-07-23: qty 50

## 2022-07-23 MED ORDER — PALONOSETRON HCL INJECTION 0.25 MG/5ML
0.2500 mg | Freq: Once | INTRAVENOUS | Status: AC
  Administered 2022-07-23: 0.25 mg via INTRAVENOUS
  Filled 2022-07-23: qty 5

## 2022-07-23 NOTE — Patient Instructions (Signed)
[facility]  Discharge Instructions: Thank you for choosing [facility] to provide your oncology and hematology care.  If you have a lab appointment with the [facility], please come in thru the Main Entrance and check in at the main information desk.  Wear comfortable clothing and clothing appropriate for easy access to any Portacath or PICC line.   We strive to give you quality time with your provider. You may need to reschedule your appointment if you arrive late (15 or more minutes).  Arriving late affects you and other patients whose appointments are after yours.  Also, if you miss three or more appointments without notifying the office, you may be dismissed from the clinic at the provider's discretion.      For prescription refill requests, have your pharmacy contact our office and allow 72 hours for refills to be completed.    Today you received the following chemotherapy and/or immunotherapy agents Keytruda/Taxol/Carboplatin      To help prevent nausea and vomiting after your treatment, we encourage you to take your nausea medication as directed.  BELOW ARE SYMPTOMS THAT SHOULD BE REPORTED IMMEDIATELY: *FEVER GREATER THAN 100.4 F (38 C) OR HIGHER *CHILLS OR SWEATING *NAUSEA AND VOMITING THAT IS NOT CONTROLLED WITH YOUR NAUSEA MEDICATION *UNUSUAL SHORTNESS OF BREATH *UNUSUAL BRUISING OR BLEEDING *URINARY PROBLEMS (pain or burning when urinating, or frequent urination) *BOWEL PROBLEMS (unusual diarrhea, constipation, pain near the anus) TENDERNESS IN MOUTH AND THROAT WITH OR WITHOUT PRESENCE OF ULCERS (sore throat, sores in mouth, or a toothache) UNUSUAL RASH, SWELLING OR PAIN  UNUSUAL VAGINAL DISCHARGE OR ITCHING   Items with * indicate a potential emergency and should be followed up as soon as possible or go to the Emergency Department if any problems should occur.  Please show the CHEMOTHERAPY ALERT CARD or IMMUNOTHERAPY ALERT CARD at  check-in to the Emergency Department and triage nurse.  Should you have questions after your visit or need to cancel or reschedule your appointment, please contact [facility] [phone]  and follow the prompts.  Office hours are 8:00 a.m. to 4:30 p.m. Monday - Friday. Please note that voicemails left after 4:00 p.m. may not be returned until the following business day.  We are closed weekends and major holidays. You have access to a nurse at all times for urgent questions. Please call the main number to the clinic [phone] and follow the prompts.  For any non-urgent questions, you may also contact your provider using MyChart. We now offer e-Visits for anyone 1 and older to request care online for non-urgent symptoms. For details visit [email or web address].   Also download the MyChart app! Go to the app store, search "MyChart", open the app, select [facility], and log in with your MyChart username and password.  Masks are optional in the cancer centers. If you would like for your care team to wear a mask while they are taking care of you, please let them know. You may have one support person who is at least 86 years old accompany you for your appointments.

## 2022-07-23 NOTE — Progress Notes (Signed)
Patients port flushed without difficulty.  Good blood return noted with no bruising or swelling noted at site.  Stable during access and blood draw.  Patient to remain accessed for treatment. 

## 2022-07-23 NOTE — Progress Notes (Signed)
Patient presents today for Keytruda/Taxol/Carboplatin per providers order.  Vital signs within parameters for treatment.  Labs pending.  Patient has no new complaints at this time.  Labs within parameters for treatment.  Treatment given today per MD orders.  Stable during infusion without adverse affects.  Vital signs stable.  No complaints at this time.  Discharge from clinic via wheelchair in stable condition.  Alert and oriented X 3.  Follow up with [facility] as scheduled.

## 2022-07-24 ENCOUNTER — Inpatient Hospital Stay: Payer: Medicare Other

## 2022-07-24 VITALS — BP 121/74 | HR 73 | Temp 97.0°F | Resp 18

## 2022-07-24 DIAGNOSIS — C760 Malignant neoplasm of head, face and neck: Secondary | ICD-10-CM

## 2022-07-24 DIAGNOSIS — C771 Secondary and unspecified malignant neoplasm of intrathoracic lymph nodes: Secondary | ICD-10-CM | POA: Diagnosis not present

## 2022-07-24 DIAGNOSIS — Z5111 Encounter for antineoplastic chemotherapy: Secondary | ICD-10-CM | POA: Diagnosis not present

## 2022-07-24 DIAGNOSIS — Z85828 Personal history of other malignant neoplasm of skin: Secondary | ICD-10-CM | POA: Diagnosis not present

## 2022-07-24 DIAGNOSIS — R918 Other nonspecific abnormal finding of lung field: Secondary | ICD-10-CM | POA: Diagnosis not present

## 2022-07-24 DIAGNOSIS — I10 Essential (primary) hypertension: Secondary | ICD-10-CM | POA: Diagnosis not present

## 2022-07-24 DIAGNOSIS — Z79899 Other long term (current) drug therapy: Secondary | ICD-10-CM | POA: Diagnosis not present

## 2022-07-24 DIAGNOSIS — Z801 Family history of malignant neoplasm of trachea, bronchus and lung: Secondary | ICD-10-CM | POA: Diagnosis not present

## 2022-07-24 DIAGNOSIS — Z95828 Presence of other vascular implants and grafts: Secondary | ICD-10-CM

## 2022-07-24 DIAGNOSIS — Z5112 Encounter for antineoplastic immunotherapy: Secondary | ICD-10-CM | POA: Diagnosis not present

## 2022-07-24 DIAGNOSIS — Z5189 Encounter for other specified aftercare: Secondary | ICD-10-CM | POA: Diagnosis not present

## 2022-07-24 DIAGNOSIS — Z87891 Personal history of nicotine dependence: Secondary | ICD-10-CM | POA: Diagnosis not present

## 2022-07-24 LAB — T4: T4, Total: 7.4 ug/dL (ref 4.5–12.0)

## 2022-07-24 MED ORDER — PEGFILGRASTIM-CBQV 6 MG/0.6ML ~~LOC~~ SOSY
6.0000 mg | PREFILLED_SYRINGE | Freq: Once | SUBCUTANEOUS | Status: AC
  Administered 2022-07-24: 6 mg via SUBCUTANEOUS
  Filled 2022-07-24: qty 0.6

## 2022-07-24 NOTE — Progress Notes (Signed)
24 hour call back;  Patient is doing well, no nausea, vomiting , or diarrhea.  Appetite is good and no fatigue.

## 2022-07-24 NOTE — Progress Notes (Signed)
Patient tolerated Udenyca injection with no complaints voiced.  Site clean and dry with no bruising or swelling noted.  No complaints of pain.  Discharged with vital signs stable and no signs or symptoms of distress noted.  

## 2022-07-24 NOTE — Patient Instructions (Signed)
[name]  Discharge Instructions: Thank you for choosing [facility] to provide your oncology and hematology care.  If you have a lab appointment with the [facility], please come in thru the Main Entrance and check in at the main information desk.  Wear comfortable clothing and clothing appropriate for easy access to any Portacath or PICC line.   We strive to give you quality time with your provider. You may need to reschedule your appointment if you arrive late (15 or more minutes).  Arriving late affects you and other patients whose appointments are after yours.  Also, if you miss three or more appointments without notifying the office, you may be dismissed from the clinic at the provider's discretion.      For prescription refill requests, have your pharmacy contact our office and allow 72 hours for refills to be completed.    Today you received the following chemotherapy and/or immunotherapy agents Udenyca.  Pegfilgrastim Injection What is this medication? PEGFILGRASTIM (PEG fil gra stim) lowers the risk of infection in people who are receiving chemotherapy. It works by Building control surveyor make more white blood cells, which protects your body from infection. It may also be used to help people who have been exposed to high doses of radiation. This medicine may be used for other purposes; ask your health care provider or pharmacist if you have questions. COMMON BRAND NAME(S): [place] [employer], Neulasta, Nyvepria, Stimufend, UDENYCA, Ziextenzo What should I tell my care team before I take this medication? They need to know if you have any of these conditions: Kidney disease Latex allergy Ongoing radiation therapy Sickle cell disease Skin reactions to acrylic adhesives (On-Body Injector only) An unusual or allergic reaction to pegfilgrastim, filgrastim, other medications, foods, dyes, or preservatives Pregnant or trying to get  pregnant Breast-feeding How should I use this medication? This medication is for injection under the skin. If you get this medication at home, you will be taught how to prepare and give the pre-filled syringe or how to use the On-body Injector. Refer to the patient Instructions for Use for detailed instructions. Use exactly as directed. Tell your care team immediately if you suspect that the On-body Injector may not have performed as intended or if you suspect the use of the On-body Injector resulted in a missed or partial dose. It is important that you put your used needles and syringes in a special sharps container. Do not put them in a trash can. If you do not have a sharps container, call your pharmacist or care team to get one. Talk to your care team about the use of this medication in children. While this medication may be prescribed for selected conditions, precautions do apply. Overdosage: If you think you have taken too much of this medicine contact a poison control center or emergency room at once. NOTE: This medicine is only for you. Do not share this medicine with others. What if I miss a dose? It is important not to miss your dose. Call your care team if you miss your dose. If you miss a dose due to an On-body Injector failure or leakage, a new dose should be administered as soon as possible using a single prefilled syringe for manual use. What may interact with this medication? Interactions have not been studied. This list may not describe all possible interactions. Give your health care provider a list of all the medicines, herbs, non-prescription drugs, or dietary supplements you use. Also tell  them if you smoke, drink alcohol, or use illegal drugs. Some items may interact with your medicine. What should I watch for while using this medication? Your condition will be monitored carefully while you are receiving this medication. You may need blood work done while you are taking this  medication. Talk to your care team about your risk of cancer. You may be more at risk for certain types of cancer if you take this medication. If you are going to need a MRI, CT scan, or other procedure, tell your care team that you are using this medication (On-Body Injector only). What side effects may I notice from receiving this medication? Side effects that you should report to your care team as soon as possible: Allergic reactions--skin rash, itching, hives, swelling of the face, lips, tongue, or throat Capillary leak syndrome--stomach or muscle pain, unusual weakness or fatigue, feeling faint or lightheaded, decrease in the amount of urine, swelling of the ankles, hands, or feet, trouble breathing High white blood cell level--fever, fatigue, trouble breathing, night sweats, change in vision, weight loss Inflammation of the aorta--fever, fatigue, back, chest, or stomach pain, severe headache Kidney injury (glomerulonephritis)--decrease in the amount of urine, red or dark Bekim Werntz urine, foamy or bubbly urine, swelling of the ankles, hands, or feet Shortness of breath or trouble breathing Spleen injury--pain in upper left stomach or shoulder Unusual bruising or bleeding Side effects that usually do not require medical attention (report to your care team if they continue or are bothersome): Bone pain Pain in the hands or feet This list may not describe all possible side effects. Call your doctor for medical advice about side effects. You may report side effects to FDA at 1-800-FDA-1088. Where should I keep my medication? Keep out of the reach of children. If you are using this medication at home, you will be instructed on how to store it. Throw away any unused medication after the expiration date on the label. NOTE: This sheet is a summary. It may not cover all possible information. If you have questions about this medicine, talk to your doctor, pharmacist, or health care provider.  2023  Elsevier/Gold Standard (2013-12-29 00:00:00)        To help prevent nausea and vomiting after your treatment, we encourage you to take your nausea medication as directed.  BELOW ARE SYMPTOMS THAT SHOULD BE REPORTED IMMEDIATELY: *FEVER GREATER THAN 100.4 F (38 C) OR HIGHER *CHILLS OR SWEATING *NAUSEA AND VOMITING THAT IS NOT CONTROLLED WITH YOUR NAUSEA MEDICATION *UNUSUAL SHORTNESS OF BREATH *UNUSUAL BRUISING OR BLEEDING *URINARY PROBLEMS (pain or burning when urinating, or frequent urination) *BOWEL PROBLEMS (unusual diarrhea, constipation, pain near the anus) TENDERNESS IN MOUTH AND THROAT WITH OR WITHOUT PRESENCE OF ULCERS (sore throat, sores in mouth, or a toothache) UNUSUAL RASH, SWELLING OR PAIN  UNUSUAL VAGINAL DISCHARGE OR ITCHING   Items with * indicate a potential emergency and should be followed up as soon as possible or go to the Emergency Department if any problems should occur.  Please show the CHEMOTHERAPY ALERT CARD or IMMUNOTHERAPY ALERT CARD at check-in to the Emergency Department and triage nurse.  Should you have questions after your visit or need to cancel or reschedule your appointment, please contact [facility] [phone]  and follow the prompts.  Office hours are 8:00 a.m. to 4:30 p.m. Monday - Friday. Please note that voicemails left after 4:00 p.m. may not be returned until the following business day.  We are closed weekends and major  holidays. You have access to a nurse at all times for urgent questions. Please call the main number to the clinic [phone] and follow the prompts.  For any non-urgent questions, you may also contact your provider using MyChart. We now offer e-Visits for anyone 23 and older to request care online for non-urgent symptoms. For details visit [email or web address].   Also download the MyChart app! Go to the app store, search "MyChart", open the app, select [facility], and log in with your MyChart username and  password.  Masks are optional in the cancer centers. If you would like for your care team to wear a mask while they are taking care of you, please let them know. You may have one support person who is at least 86 years old accompany you for your appointments.

## 2022-07-27 ENCOUNTER — Other Ambulatory Visit: Payer: Self-pay

## 2022-07-27 DIAGNOSIS — C760 Malignant neoplasm of head, face and neck: Secondary | ICD-10-CM

## 2022-07-29 ENCOUNTER — Other Ambulatory Visit: Payer: Medicare Other

## 2022-07-29 ENCOUNTER — Ambulatory Visit: Payer: Medicare Other | Admitting: Physician Assistant

## 2022-07-30 ENCOUNTER — Inpatient Hospital Stay (HOSPITAL_BASED_OUTPATIENT_CLINIC_OR_DEPARTMENT_OTHER): Payer: Medicare Other | Admitting: Physician Assistant

## 2022-07-30 ENCOUNTER — Inpatient Hospital Stay: Payer: Medicare Other

## 2022-07-30 DIAGNOSIS — R1319 Other dysphagia: Secondary | ICD-10-CM

## 2022-07-30 DIAGNOSIS — R590 Localized enlarged lymph nodes: Secondary | ICD-10-CM | POA: Diagnosis not present

## 2022-07-30 DIAGNOSIS — I10 Essential (primary) hypertension: Secondary | ICD-10-CM | POA: Diagnosis not present

## 2022-07-30 DIAGNOSIS — C760 Malignant neoplasm of head, face and neck: Secondary | ICD-10-CM

## 2022-07-30 DIAGNOSIS — Z5189 Encounter for other specified aftercare: Secondary | ICD-10-CM | POA: Diagnosis not present

## 2022-07-30 DIAGNOSIS — Z801 Family history of malignant neoplasm of trachea, bronchus and lung: Secondary | ICD-10-CM | POA: Diagnosis not present

## 2022-07-30 DIAGNOSIS — R918 Other nonspecific abnormal finding of lung field: Secondary | ICD-10-CM | POA: Diagnosis not present

## 2022-07-30 DIAGNOSIS — Z79899 Other long term (current) drug therapy: Secondary | ICD-10-CM | POA: Diagnosis not present

## 2022-07-30 DIAGNOSIS — Z85828 Personal history of other malignant neoplasm of skin: Secondary | ICD-10-CM | POA: Diagnosis not present

## 2022-07-30 DIAGNOSIS — Z5111 Encounter for antineoplastic chemotherapy: Secondary | ICD-10-CM | POA: Diagnosis not present

## 2022-07-30 DIAGNOSIS — Z87891 Personal history of nicotine dependence: Secondary | ICD-10-CM | POA: Diagnosis not present

## 2022-07-30 DIAGNOSIS — Z5112 Encounter for antineoplastic immunotherapy: Secondary | ICD-10-CM | POA: Diagnosis not present

## 2022-07-30 DIAGNOSIS — C771 Secondary and unspecified malignant neoplasm of intrathoracic lymph nodes: Secondary | ICD-10-CM | POA: Diagnosis not present

## 2022-07-30 LAB — COMPREHENSIVE METABOLIC PANEL
ALT: 23 U/L (ref 0–44)
AST: 28 U/L (ref 15–41)
Albumin: 3.4 g/dL — ABNORMAL LOW (ref 3.5–5.0)
Alkaline Phosphatase: 84 U/L (ref 38–126)
Anion gap: 7 (ref 5–15)
BUN: 19 mg/dL (ref 8–23)
CO2: 27 mmol/L (ref 22–32)
Calcium: 8.7 mg/dL — ABNORMAL LOW (ref 8.9–10.3)
Chloride: 104 mmol/L (ref 98–111)
Creatinine, Ser: 1.02 mg/dL (ref 0.61–1.24)
GFR, Estimated: >60 mL/min (ref >60)
Glucose, Bld: 161 mg/dL — ABNORMAL HIGH (ref 70–99)
Potassium: 3.7 mmol/L (ref 3.5–5.1)
Sodium: 138 mmol/L (ref 135–145)
Total Bilirubin: 0.6 mg/dL (ref 0.3–1.2)
Total Protein: 6.2 g/dL — ABNORMAL LOW (ref 6.5–8.1)

## 2022-07-30 LAB — CBC WITH DIFFERENTIAL/PLATELET
Abs Immature Granulocytes: 0 10*3/uL (ref 0.00–0.07)
Band Neutrophils: 29 %
Basophils Absolute: 0 10*3/uL (ref 0.0–0.1)
Basophils Relative: 0 %
Eosinophils Absolute: 0.6 10*3/uL — ABNORMAL HIGH (ref 0.0–0.5)
Eosinophils Relative: 3 %
HCT: 37.5 % — ABNORMAL LOW (ref 39.0–52.0)
Hemoglobin: 12 g/dL — ABNORMAL LOW (ref 13.0–17.0)
Lymphocytes Relative: 21 %
Lymphs Abs: 4.2 10*3/uL — ABNORMAL HIGH (ref 0.7–4.0)
MCH: 33.1 pg (ref 26.0–34.0)
MCHC: 32 g/dL (ref 30.0–36.0)
MCV: 103.6 fL — ABNORMAL HIGH (ref 80.0–100.0)
Monocytes Absolute: 1.2 10*3/uL — ABNORMAL HIGH (ref 0.1–1.0)
Monocytes Relative: 6 %
Neutro Abs: 14 10*3/uL — ABNORMAL HIGH (ref 1.7–7.7)
Neutrophils Relative %: 41 %
Platelets: 170 10*3/uL (ref 150–400)
RBC: 3.62 MIL/uL — ABNORMAL LOW (ref 4.22–5.81)
RDW: 13.2 % (ref 11.5–15.5)
WBC Morphology: INCREASED
WBC: 20 10*3/uL — ABNORMAL HIGH (ref 4.0–10.5)
nRBC: 0 % (ref 0.0–0.2)

## 2022-07-30 LAB — MAGNESIUM: Magnesium: 1.8 mg/dL (ref 1.7–2.4)

## 2022-07-30 MED ORDER — SODIUM CHLORIDE 0.9% FLUSH
10.0000 mL | Freq: Once | INTRAVENOUS | Status: AC
  Administered 2022-07-30: 10 mL via INTRAVENOUS

## 2022-07-30 MED ORDER — HEPARIN SOD (PORK) LOCK FLUSH 100 UNIT/ML IV SOLN
500.0000 [IU] | Freq: Once | INTRAVENOUS | Status: AC
  Administered 2022-07-30: 500 [IU] via INTRAVENOUS

## 2022-07-30 NOTE — Progress Notes (Signed)
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   BRIEF ONCOLOGIC HISTORY:  Oncology History  Squamous cell carcinoma of head and neck ([facility])  07/16/2022 Initial Diagnosis   Squamous cell carcinoma of head and neck ([facility])   07/23/2022 -  Chemotherapy   Patient is on Treatment Plan : Carboplatin  + Paclitaxel  + Pembrolizumab (200) D1 q21d       CANCER STAGING:  Cancer Staging  Squamous cell carcinoma of head and neck ([facility]) Staging form: Cervical Lymph Nodes and Unknown Primary Tumors of the Head and Neck, AJCC 8th Edition - Clinical stage from 07/16/2022: Stage IVC (cT0, cN3b, cM1) - Unsigned    INTERVAL HISTORY:  Mr. Kleinman 88 y.o. male seen for follow-up of squamous cell carcinoma, unknown primary. He received Cycle 1 of carbo/taxol and pembrolizumab on 07/23/2022. He reports that he felt diffuse joint/bone pain for a few days after receiving udenyca injection. This includes his legs, bilateral chest and hips. He reports feeling fatigued but that has slowly improved. He has a good appetite but reports some dysphagia with increased swelling of right neck masses. He has to eat small bites to prevent regurgitation. He denies nausea, vomiting or abdominal pain. His bowel habits are unchanged without any recurrent episodes of diarrhea or constipation. He denies easy bruising or signs of active bleeding. He denies fevers, chills, sweats, shortness of breath,or cough. He has no other complaints.     REVIEW OF SYSTEMS:  Review of Systems  Constitutional:  Positive for fatigue. Negative  for appetite change, chills and fever.  HENT:   Positive for trouble swallowing.   Respiratory:  Negative for cough and shortness of breath.   Cardiovascular:  Positive for chest pain.  Musculoskeletal:  Positive for arthralgias.  Skin:  Negative for rash.  Neurological:  Positive for dizziness.  All other systems reviewed and are negative.    PAST MEDICAL/SURGICAL HISTORY:  Past Medical History:  Diagnosis Date   Anemia    Atrial fibrillation (HCC)    Cataract    Cellulitis    In past   Complication of anesthesia    HARD TIME WAKING; CAUSES MY BP TO GO UP    Coronary artery disease    5 bypasses   DJD (degenerative joint disease)    Ejection fraction < 50%    Mildly reduced, 40% by echo   GERD (gastroesophageal reflux disease)    Hyperlipidemia    Hypertension    Persistent atrial fibrillation ([facility])    Prostate hypertrophy    on CT scan 09/2014   PUD (peptic ulcer disease)    RESOLVED    Skin cancer of eyelid    Resected   Past Surgical History:  Procedure Laterality Date   APPENDECTOMY     BYPASS GRAFT  2005   CATARACT EXTRACTION W/PHACO Left 07/22/2015   Procedure: CATARACT EXTRACTION PHACO AND INTRAOCULAR LENS PLACEMENT LEFT EYE CDE=8.14;  Surgeon: Tonny Branch, MD;  Location: [facility];  Service: Ophthalmology;  Laterality: Left;   CATARACT EXTRACTION W/PHACO Right 08/18/2019   Procedure: CATARACT EXTRACTION PHACO AND INTRAOCULAR LENS PLACEMENT (IOC);  Surgeon: Baruch Goldmann, MD;  Location: [facility];  Service: Ophthalmology;  Laterality: Right;  CDE: 9.74   CORONARY ARTERY BYPASS GRAFT  4/05   LIMA to LAD, SVG to PDA, SVG to ramus intermediate   EYE SURGERY  07/2015   EYELID CARCINOMA EXCISION     Skin cancer resected   Heart bypass  2005   LYMPH NODE BIOPSY Right 06/09/2022   Procedure: LYMPH NODE BIOPSY; supraclavicular;  Surgeon: Virl Cagey, MD;  Location: [facility];  Service: General;  Laterality: Right;   PORTACATH PLACEMENT Left 06/09/2022   Procedure:  INSERTION PORT-A-CATH;  Surgeon: Virl Cagey, MD;  Location: [facility];  Service: General;  Laterality: Left;   TOTAL HIP ARTHROPLASTY     Right   TOTAL HIP ARTHROPLASTY Left 05/04/2018   Procedure: LEFT TOTAL HIP ARTHROPLASTY;  Surgeon: [name], MD;  Location: [facility];  Service: Orthopedics;  Laterality: Left;   TOTAL KNEE ARTHROPLASTY     Right     SOCIAL HISTORY:  Social History   Socioeconomic History   Marital status: Married    Spouse name: [name]   Number of children: 1   Years of education: 10   Highest education level: 10th grade  Occupational History   Occupation: retired  Tobacco Use   Smoking status: Former    Packs/day: 2.00    Years: 5.00    Total pack years: 10.00    Types: Cigarettes    Quit date: 12/23/1963    Years since quitting: 58.6   Smokeless tobacco: Never  Vaping Use   Vaping Use: Never used  Substance and Sexual Activity   Alcohol use: No   Drug use: No   Sexual activity: Yes  Other Topics Concern   Not on file  Social History Narrative   Lives in a split level home.   Social Determinants of Health   Financial Resource Strain: Low Risk  (12/11/2021)   Overall Financial Resource Strain (CARDIA)    Difficulty of Paying Living Expenses: Not hard at all  Food Insecurity: No Food Insecurity (12/11/2021)   Hunger Vital Sign    Worried About Running Out of Food in the Last Year: Never true    Ran Out of Food in the Last Year: Never true  Transportation Needs: No Transportation Needs (12/11/2021)   PRAPARE - Hydrologist (Medical): No    Lack of Transportation (Non-Medical): No  Physical Activity: Insufficiently Active (12/11/2021)   Exercise Vital Sign    Days of Exercise per Week: 7 days    Minutes of Exercise per Session: 20 min  Stress: No Stress Concern Present (12/11/2021)   [place]    Feeling of Stress : Only a little  Recent Concern:  Stress - Stress Concern Present (10/31/2021)   [place]    Feeling of Stress : To some extent  Social Connections: Moderately Isolated (12/11/2021)   Social Connection and Isolation Panel [NHANES]    Frequency of Communication with Friends and Family: More than three times a week    Frequency of Social Gatherings with Friends and Family: More than three times a week    Attends Religious Services: Never    Marine scientist or Organizations: No    Attends Archivist Meetings: Never    Marital Status: Married  Human resources officer Violence: Not At Risk (12/11/2021)   Humiliation, Afraid, Rape, and Kick questionnaire  Fear of Current or Ex-Partner: No    Emotionally Abused: No    Physically Abused: No    Sexually Abused: No    FAMILY HISTORY:  Family History  Problem Relation Age of Onset   Stroke Mother    Stroke Father    Hypertension Father    Early death Brother        101 months old   Cancer Brother        lung   Stroke Brother        heat    CURRENT MEDICATIONS:  Outpatient Encounter Medications as of 07/30/2022  Medication Sig   acetaminophen (TYLENOL) 500 MG tablet Take 1,000 mg by mouth every 6 (six) hours as needed for moderate pain.   CARBOPLATIN IV Inject into the vein every 21 ( twenty-one) days.   cetirizine-pseudoephedrine (ZYRTEC-D) 5-120 MG tablet Take once per day as need for congestion/runny nose   Cholecalciferol (VITAMIN D3) 5000 units CAPS Take 5,000 Units by mouth once a week.    diltiazem (CARDIZEM CD) 120 MG 24 hr capsule Take one capsule daily   ezetimibe (ZETIA) 10 MG tablet Take 1 tablet (10 mg total) by mouth daily.   finasteride (PROSCAR) 5 MG tablet Take one tablet each day   fluticasone (FLONASE) 50 MCG/ACT nasal spray Place 1 spray into both nostrils daily as needed for allergies or rhinitis.   furosemide (LASIX) 20 MG tablet Take 1 tablet (20 mg total) by mouth daily.    lovastatin (MEVACOR) 20 MG tablet TAKE 1/2 TABLET EVERY OTHER DAY   metoprolol tartrate (LOPRESSOR) 100 MG tablet Take 1 tablet (100 mg total) by mouth 2 (two) times daily.   PACLITAXEL IV Inject into the vein every 21 ( twenty-one) days.   PEMBROLIZUMAB IV Inject into the vein every 21 ( twenty-one) days.   prochlorperazine (COMPAZINE) 10 MG tablet Take 1 tablet (10 mg total) by mouth every 6 (six) hours as needed for nausea or vomiting.   tobramycin (TOBREX) 0.3 % ophthalmic solution    warfarin (COUMADIN) 5 MG tablet TAKE 1 TABLET ON SAT, SUN, TUES, & THURSTAKE 1/2 TABLET ON MON, WED, & FRIDAY   lidocaine-prilocaine (EMLA) cream Apply a small amount to port a cath site and cover with plastic wrap 1 hour prior to chemotherapy appointments (Patient not taking: Reported on 07/30/2022)   [EXPIRED] heparin lock flush 100 unit/mL    [EXPIRED] sodium chloride flush (NS) 0.9 % injection 10 mL    No facility-administered encounter medications on file as of 07/30/2022.    ALLERGIES:  Allergies  Allergen Reactions   Penicillins Hives and Rash    Has patient had a PCN reaction causing immediate rash, facial/tongue/throat swelling, SOB or lightheadedness with hypotension: Yes Has patient had a PCN reaction causing severe rash involving mucus membranes or skin necrosis: Yes Has patient had a PCN reaction that required hospitalization: No Has patient had a PCN reaction occurring within the last 10 years: No If all of the above answers are "NO", then may proceed with Cephalosporin use.    Hct [Hydrochlorothiazide] Other (See Comments)    hyper   Statins Other (See Comments)    Myalgia, tolerates low dosages of lovastatin      PHYSICAL EXAM:  ECOG Performance status: 1  There were no vitals filed for this visit.  There were no vitals filed for this visit.  Physical Exam Vitals reviewed.  Constitutional:      Appearance: Normal appearance.  Neck:  Comments: Palpable masses noted in  right neck region.   Cardiovascular:     Rate and Rhythm: Regular rhythm.     Heart sounds: Normal heart sounds.  Pulmonary:     Breath sounds: Normal breath sounds.  Neurological:     Mental Status: He is alert.  Psychiatric:        Mood and Affect: Mood normal.        Behavior: Behavior normal.    Neck and right axillary lymph nodes palpable.  LABORATORY DATA:  I have reviewed the labs as listed.  CBC    Component Value Date/Time   WBC 20.0 (H) 07/30/2022 1134   RBC 3.62 (L) 07/30/2022 1134   HGB 12.0 (L) 07/30/2022 1134   HGB 13.3 05/22/2022 0955   HCT 37.5 (L) 07/30/2022 1134   HCT 39.4 05/22/2022 0955   PLT 170 07/30/2022 1134   PLT 372 05/22/2022 0955   MCV 103.6 (H) 07/30/2022 1134   MCV 98 (H) 05/22/2022 0955   MCH 33.1 07/30/2022 1134   MCHC 32.0 07/30/2022 1134   RDW 13.2 07/30/2022 1134   RDW 11.6 05/22/2022 0955   LYMPHSABS 4.2 (H) 07/30/2022 1134   LYMPHSABS 5.1 (H) 05/22/2022 0955   MONOABS 1.2 (H) 07/30/2022 1134   EOSABS 0.6 (H) 07/30/2022 1134   EOSABS 0.3 05/22/2022 0955   BASOSABS 0.0 07/30/2022 1134   BASOSABS 0.1 05/22/2022 0955      Latest Ref Rng & Units 07/30/2022   11:34 AM 07/23/2022    8:08 AM 07/16/2022   12:50 PM  CMP  Glucose 70 - 99 mg/dL 161  100  92   BUN 8 - 23 mg/dL _0 Creatinine 0.61 - 1.24 mg/dL 1.02  0.94  0.98   Sodium 135 - 145 mmol/L 138  140  141   Potassium 3.5 - 5.1 mmol/L 3.7  3.8  4.4   Chloride 98 - 111 mmol/L 104  109  108   CO2 22 - 32 mmol/L _1 Calcium 8.9 - 10.3 mg/dL 8.7  8.9  9.0   Total Protein 6.5 - 8.1 g/dL 6.2  6.6  7.3   Total Bilirubin 0.3 - 1.2 mg/dL 0.6  0.7  0.8   Alkaline Phos 38 - 126 U/L 84  49  56   AST 15 - 41 U/L _2 ALT 0 - 44 U/L _3 DIAGNOSTIC IMAGING:  I have independently reviewed the scans and discussed with the patient.  ASSESSMENT: 1.  Right supraclavicular lymphadenopathy and multiple lung nodules: - Patient noticed right neck mass  since Christmas 2022.  10 to 15 pound weight loss since January 2023.  Denies any dysphagia or odynophagia. - CT neck (05/27/2022): Numerous lymph nodes in the right neck, right supraclavicular lymph node mass measures 5.7 x 3.7 cm.  18 mm posterior lymph node in the right lower neck.  Numerous lymph nodes in the right lower and posterior neck approximately 1 cm.  Many have internal necrosis consistent with metastatic disease.  20 mm lymph node just above the right clavicle.  Left level 2 node 12 mm. - CT CAP (05/28/2022): Several bilateral lung nodules, RUL nodule measuring 1.8 x 1.3 cm.  Small clusters of nodules in the medial left upper lobe.  Irregular nodular density along the left side of the mediastinum measuring 2.1 cm.  No metastatic disease in the  abdomen or pelvis.  Liver is slightly nodular contour. - Lab work shows elevated white count since 2009, predominantly lymphocytosis. - Pathology (06/09/2022): Metastatic carcinoma positive for p40, p63 and CK5/6-patchy positivity with CK7.  Negative for TTF-1, Napsin A, PAX8, prostein, PSA, CK20 and CDX2.  - PET scan (06/11/2022): Bulky right supraclavicular nodal mass, smaller hypermetabolic right posterior triangle and jugular lymph nodes and single hypermetabolic left level 3 lymph node.  Bilateral hypermetabolic pulmonary nodules and metastatic pattern.  Minimal hypermetabolic mediastinal nodal metastasis.  Cluster of hypermetabolic right axillary lymph nodes.  No evidence of metastatic disease or primary lesion in the abdomen or pelvis.  No bone lesions. - MRI of the brain: Chronic small vessel ischemic changes with no evidence of metastatic disease. - NGS: T p53 pathogenic variant, MSI-stable, TMB-low, LOH-low, PD-L1 (ME268): Negative, p16 and p18 negative - Cancer type ID: 96% probability squamous cell carcinoma, subtype head and neck/skin.  Cancer types ruled out with 95% confidence includes skin basal cell carcinoma. -- Started Carboplatin,  paclitaxel and pembrolizumab on 07/23/2022.   2.  Social/family history: - He lives at home with his wife.  Independent of ADLs and IADLs.  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  May have had asbestos exposure 30 years ago.  Quit smoking cigarettes in 1965.  Smoked 1 pack/day for less than 12 years.  2 of his brothers had lung cancer and both were smokers.   PLAN:  1.  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  --Labs from today were reviewed and require no intervention. WBC 20.0, ANC 14.0 secondary to Udenyca injection. Hgb stable at 12.0. Creatinine and LFTs normal.  --Recommend to take Claritin/antihistamine for bone pain secondary to Udenyca injection. --Will obtain CT neck due to new dysphagia and increased swelling of right neck adenopathy, scheduled for 08/05/2022.  --RTC on 08/13/2022 for labs, f/u visit with Dr. Worthy Keeler before Cycle 2.   Orders placed this encounter:  Orders Placed This Encounter  Procedures   CT SOFT TISSUE NECK W CONTRAST   Patient expressed understanding and satisfaction with the plan provided.   I have spent a total of 30 minutes minutes of face-to-face and non-face-to-face time, preparing to see the patient, performing a medically appropriate examination, counseling and educating the patient, ordering tests/procedures, documenting clinical information in the electronic health record,and care coordination.   Dede Query PA-C Dept of Hematology and [facility] Phone: [phone]

## 2022-08-05 ENCOUNTER — Ambulatory Visit (HOSPITAL_COMMUNITY)
Admission: RE | Admit: 2022-08-05 | Discharge: 2022-08-05 | Disposition: A | Discharge status: Home / Self Care | Payer: Medicare Other | Source: Ambulatory Visit | Attending: Physician Assistant | Admitting: Physician Assistant

## 2022-08-05 DIAGNOSIS — C3490 Malignant neoplasm of unspecified part of unspecified bronchus or lung: Secondary | ICD-10-CM | POA: Diagnosis not present

## 2022-08-05 DIAGNOSIS — C760 Malignant neoplasm of head, face and neck: Secondary | ICD-10-CM | POA: Diagnosis not present

## 2022-08-05 DIAGNOSIS — I6529 Occlusion and stenosis of unspecified carotid artery: Secondary | ICD-10-CM | POA: Diagnosis not present

## 2022-08-05 DIAGNOSIS — K110 Atrophy of salivary gland: Secondary | ICD-10-CM | POA: Diagnosis not present

## 2022-08-05 DIAGNOSIS — R1310 Dysphagia, unspecified: Secondary | ICD-10-CM | POA: Diagnosis not present

## 2022-08-05 MED ORDER — IOHEXOL 300 MG/ML  SOLN
75.0000 mL | Freq: Once | INTRAMUSCULAR | Status: AC | PRN
  Administered 2022-08-05: 75 mL via INTRAVENOUS

## 2022-08-13 ENCOUNTER — Inpatient Hospital Stay: Payer: Medicare Other | Attending: Hematology | Admitting: Hematology

## 2022-08-13 ENCOUNTER — Inpatient Hospital Stay: Payer: Medicare Other

## 2022-08-13 VITALS — BP 137/82 | HR 88 | Temp 97.5°F | Resp 18

## 2022-08-13 DIAGNOSIS — Z87891 Personal history of nicotine dependence: Secondary | ICD-10-CM | POA: Insufficient documentation

## 2022-08-13 DIAGNOSIS — I10 Essential (primary) hypertension: Secondary | ICD-10-CM | POA: Diagnosis not present

## 2022-08-13 DIAGNOSIS — C771 Secondary and unspecified malignant neoplasm of intrathoracic lymph nodes: Secondary | ICD-10-CM | POA: Diagnosis not present

## 2022-08-13 DIAGNOSIS — C760 Malignant neoplasm of head, face and neck: Secondary | ICD-10-CM

## 2022-08-13 DIAGNOSIS — R918 Other nonspecific abnormal finding of lung field: Secondary | ICD-10-CM | POA: Diagnosis not present

## 2022-08-13 DIAGNOSIS — Z95828 Presence of other vascular implants and grafts: Secondary | ICD-10-CM

## 2022-08-13 DIAGNOSIS — Z5111 Encounter for antineoplastic chemotherapy: Secondary | ICD-10-CM | POA: Insufficient documentation

## 2022-08-13 DIAGNOSIS — Z5112 Encounter for antineoplastic immunotherapy: Secondary | ICD-10-CM | POA: Diagnosis not present

## 2022-08-13 DIAGNOSIS — Z801 Family history of malignant neoplasm of trachea, bronchus and lung: Secondary | ICD-10-CM | POA: Diagnosis not present

## 2022-08-13 DIAGNOSIS — Z5189 Encounter for other specified aftercare: Secondary | ICD-10-CM | POA: Diagnosis not present

## 2022-08-13 DIAGNOSIS — G629 Polyneuropathy, unspecified: Secondary | ICD-10-CM | POA: Insufficient documentation

## 2022-08-13 LAB — CBC WITH DIFFERENTIAL/PLATELET
Abs Immature Granulocytes: 0.11 10*3/uL — ABNORMAL HIGH (ref 0.00–0.07)
Basophils Absolute: 0.1 10*3/uL (ref 0.0–0.1)
Basophils Relative: 1 %
Eosinophils Absolute: 0.1 10*3/uL (ref 0.0–0.5)
Eosinophils Relative: 1 %
HCT: 37.4 % — ABNORMAL LOW (ref 39.0–52.0)
Hemoglobin: 12.2 g/dL — ABNORMAL LOW (ref 13.0–17.0)
Immature Granulocytes: 1 %
Lymphocytes Relative: 28 %
Lymphs Abs: 3.5 10*3/uL (ref 0.7–4.0)
MCH: 33.5 pg (ref 26.0–34.0)
MCHC: 32.6 g/dL (ref 30.0–36.0)
MCV: 102.7 fL — ABNORMAL HIGH (ref 80.0–100.0)
Monocytes Absolute: 1 10*3/uL (ref 0.1–1.0)
Monocytes Relative: 8 %
Neutro Abs: 7.8 10*3/uL — ABNORMAL HIGH (ref 1.7–7.7)
Neutrophils Relative %: 61 %
Platelets: 405 10*3/uL — ABNORMAL HIGH (ref 150–400)
RBC: 3.64 MIL/uL — ABNORMAL LOW (ref 4.22–5.81)
RDW: 13.5 % (ref 11.5–15.5)
WBC: 12.7 10*3/uL — ABNORMAL HIGH (ref 4.0–10.5)
nRBC: 0 % (ref 0.0–0.2)

## 2022-08-13 LAB — MAGNESIUM: Magnesium: 2.1 mg/dL (ref 1.7–2.4)

## 2022-08-13 LAB — COMPREHENSIVE METABOLIC PANEL
ALT: 13 U/L (ref 0–44)
AST: 14 U/L — ABNORMAL LOW (ref 15–41)
Albumin: 3.6 g/dL (ref 3.5–5.0)
Alkaline Phosphatase: 72 U/L (ref 38–126)
Anion gap: 7 (ref 5–15)
BUN: 12 mg/dL (ref 8–23)
CO2: 27 mmol/L (ref 22–32)
Calcium: 8.7 mg/dL — ABNORMAL LOW (ref 8.9–10.3)
Chloride: 104 mmol/L (ref 98–111)
Creatinine, Ser: 0.88 mg/dL (ref 0.61–1.24)
GFR, Estimated: >60 mL/min (ref >60)
Glucose, Bld: 105 mg/dL — ABNORMAL HIGH (ref 70–99)
Potassium: 4 mmol/L (ref 3.5–5.1)
Sodium: 138 mmol/L (ref 135–145)
Total Bilirubin: 0.8 mg/dL (ref 0.3–1.2)
Total Protein: 6.8 g/dL (ref 6.5–8.1)

## 2022-08-13 MED ORDER — SODIUM CHLORIDE 0.9 % IV SOLN
125.0000 mg/m2 | Freq: Once | INTRAVENOUS | Status: AC
  Administered 2022-08-13: 246 mg via INTRAVENOUS
  Filled 2022-08-13: qty 41

## 2022-08-13 MED ORDER — SODIUM CHLORIDE 0.9 % IV SOLN
150.0000 mg | Freq: Once | INTRAVENOUS | Status: AC
  Administered 2022-08-13: 150 mg via INTRAVENOUS
  Filled 2022-08-13: qty 150

## 2022-08-13 MED ORDER — FAMOTIDINE IN NACL 20-0.9 MG/50ML-% IV SOLN
20.0000 mg | Freq: Once | INTRAVENOUS | Status: AC
  Administered 2022-08-13: 20 mg via INTRAVENOUS
  Filled 2022-08-13: qty 50

## 2022-08-13 MED ORDER — HEPARIN SOD (PORK) LOCK FLUSH 100 UNIT/ML IV SOLN
500.0000 [IU] | Freq: Once | INTRAVENOUS | Status: AC | PRN
  Administered 2022-08-13: 500 [IU]

## 2022-08-13 MED ORDER — SODIUM CHLORIDE 0.9 % IV SOLN
200.0000 mg | Freq: Once | INTRAVENOUS | Status: AC
  Administered 2022-08-13: 200 mg via INTRAVENOUS
  Filled 2022-08-13: qty 8

## 2022-08-13 MED ORDER — SODIUM CHLORIDE 0.9% FLUSH
10.0000 mL | INTRAVENOUS | Status: DC | PRN
  Administered 2022-08-13: 10 mL

## 2022-08-13 MED ORDER — SODIUM CHLORIDE 0.9 % IV SOLN
305.2000 mg | Freq: Once | INTRAVENOUS | Status: AC
  Administered 2022-08-13: 310 mg via INTRAVENOUS
  Filled 2022-08-13: qty 31

## 2022-08-13 MED ORDER — DIPHENHYDRAMINE HCL 50 MG/ML IJ SOLN
25.0000 mg | Freq: Once | INTRAMUSCULAR | Status: AC
  Administered 2022-08-13: 25 mg via INTRAVENOUS
  Filled 2022-08-13: qty 1

## 2022-08-13 MED ORDER — PALONOSETRON HCL INJECTION 0.25 MG/5ML
0.2500 mg | Freq: Once | INTRAVENOUS | Status: AC
  Administered 2022-08-13: 0.25 mg via INTRAVENOUS
  Filled 2022-08-13: qty 5

## 2022-08-13 MED ORDER — SODIUM CHLORIDE 0.9 % IV SOLN: Freq: Once | INTRAVENOUS | Status: AC

## 2022-08-13 MED ORDER — SODIUM CHLORIDE 0.9 % IV SOLN
10.0000 mg | Freq: Once | INTRAVENOUS | Status: AC
  Administered 2022-08-13: 10 mg via INTRAVENOUS
  Filled 2022-08-13: qty 10

## 2022-08-13 NOTE — Progress Notes (Signed)
Patients port flushed without difficulty.  Good blood return noted with no bruising or swelling noted at site.  Stable during access and blood draw.  Patient to remain accessed for treatment. 

## 2022-08-13 NOTE — Progress Notes (Signed)
Patient has been examined by Dr. Katragadda, and vital signs and labs have been reviewed. ANC, Creatinine, LFTs, hemoglobin, and platelets are within treatment parameters per M.D. - pt may proceed with treatment.  Primary RN and pharmacy notified.  

## 2022-08-13 NOTE — Progress Notes (Signed)
Patient tolerated chemotherapy with no complaints voiced. Side effects with management reviewed understanding verbalized. Port site clean and dry with no bruising or swelling noted at site. Good blood return noted before and after administration of chemotherapy. Band aid applied. Patient left in satisfactory condition with VSS and no s/s of distress noted. 

## 2022-08-13 NOTE — Progress Notes (Signed)
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  PRIOR THERAPY: None  NGS Results: Tp53 mutation positive, MSI-stable, TMB-low, PD-L1 (NA355): Negative  CURRENT THERAPY: Carboplatin, paclitaxel and pembrolizumab  BRIEF ONCOLOGIC HISTORY:  Oncology History  Squamous cell carcinoma of head and neck ([facility])  07/16/2022 Initial Diagnosis   Squamous cell carcinoma of head and neck ([facility])   07/23/2022 -  Chemotherapy   Patient is on Treatment Plan : Carboplatin  + Paclitaxel  + Pembrolizumab (200) D1 q21d       CANCER STAGING:  Cancer Staging  Squamous cell carcinoma of head and neck ([facility]) Staging form: Cervical Lymph Nodes and Unknown Primary Tumors of the Head and Neck, AJCC 8th Edition - Clinical stage from 07/16/2022: Stage IVC (cT0, cN3b, cM1) - Unsigned    INTERVAL HISTORY:  Mr. Shiraishi 88 y.o. male seen for follow-up of squamous cell carcinoma, unknown primary.  Received cycle 1 on 07/23/2022.  He reported burning sensation and pain in the lower extremities at nighttime 2 to 3 days after last chemotherapy.  It lasted about 2 to 3 days.  Took Tylenol which helped.  He also complained of feeling tired all 3 weeks.  He had on and off numbness in the fingertips for few days which is completely gone now.    REVIEW OF SYSTEMS:  Review of Systems  Neurological:  Positive for dizziness and numbness (On and off numbness in the fingertips).  All other systems reviewed and are negative.    PAST MEDICAL/SURGICAL HISTORY:  Past Medical History:  Diagnosis Date   Anemia    Atrial fibrillation (HCC)    Cataract    Cellulitis    In past   Complication of anesthesia    HARD TIME WAKING; CAUSES MY BP TO GO UP    Coronary artery disease    5 bypasses   DJD  (degenerative joint disease)    Ejection fraction < 50%    Mildly reduced, 40% by echo   GERD (gastroesophageal reflux disease)    Hyperlipidemia    Hypertension    Persistent atrial fibrillation ([facility])    Prostate hypertrophy    on CT scan 09/2014   PUD (peptic ulcer disease)    RESOLVED    Skin cancer of eyelid    Resected   Past Surgical History:  Procedure Laterality Date   APPENDECTOMY     BYPASS GRAFT  2005   CATARACT EXTRACTION W/PHACO Left 07/22/2015   Procedure: CATARACT EXTRACTION PHACO AND INTRAOCULAR LENS PLACEMENT LEFT EYE CDE=8.14;  Surgeon: Tonny Branch, MD;  Location: [facility];  Service: Ophthalmology;  Laterality: Left;   CATARACT EXTRACTION W/PHACO Right 08/18/2019   Procedure: CATARACT EXTRACTION PHACO AND INTRAOCULAR LENS PLACEMENT (IOC);  Surgeon: Baruch Goldmann, MD;  Location: [facility];  Service: Ophthalmology;  Laterality: Right;  CDE: 9.74   CORONARY ARTERY BYPASS GRAFT  4/05   LIMA to LAD, SVG to PDA, SVG to ramus intermediate   EYE SURGERY  07/2015   EYELID CARCINOMA EXCISION     Skin cancer resected   Heart bypass  2005   LYMPH NODE BIOPSY Right 06/09/2022   Procedure: LYMPH NODE BIOPSY; supraclavicular;  Surgeon: Virl Cagey, MD;  Location: [facility];  Service: General;  Laterality: Right;   PORTACATH PLACEMENT Left  06/09/2022   Procedure: INSERTION PORT-A-CATH;  Surgeon: Virl Cagey, MD;  Location: [facility];  Service: General;  Laterality: Left;   TOTAL HIP ARTHROPLASTY     Right   TOTAL HIP ARTHROPLASTY Left 05/04/2018   Procedure: LEFT TOTAL HIP ARTHROPLASTY;  Surgeon: [name], MD;  Location: [facility];  Service: Orthopedics;  Laterality: Left;   TOTAL KNEE ARTHROPLASTY     Right     SOCIAL HISTORY:  Social History   Socioeconomic History   Marital status: Married    Spouse name: [name]   Number of children: 1   Years of education: 10   Highest education level: 10th grade  Occupational History   Occupation: retired  Tobacco Use    Smoking status: Former    Packs/day: 2.00    Years: 5.00    Total pack years: 10.00    Types: Cigarettes    Quit date: 12/23/1963    Years since quitting: 58.6   Smokeless tobacco: Never  Vaping Use   Vaping Use: Never used  Substance and Sexual Activity   Alcohol use: No   Drug use: No   Sexual activity: Yes  Other Topics Concern   Not on file  Social History Narrative   Lives in a split level home.   Social Determinants of Health   Financial Resource Strain: Low Risk  (12/11/2021)   Overall Financial Resource Strain (CARDIA)    Difficulty of Paying Living Expenses: Not hard at all  Food Insecurity: No Food Insecurity (12/11/2021)   Hunger Vital Sign    Worried About Running Out of Food in the Last Year: Never true    Ran Out of Food in the Last Year: Never true  Transportation Needs: No Transportation Needs (12/11/2021)   PRAPARE - Hydrologist (Medical): No    Lack of Transportation (Non-Medical): No  Physical Activity: Insufficiently Active (12/11/2021)   Exercise Vital Sign    Days of Exercise per Week: 7 days    Minutes of Exercise per Session: 20 min  Stress: No Stress Concern Present (12/11/2021)   [place]    Feeling of Stress : Only a little  Recent Concern: Stress - Stress Concern Present (10/31/2021)   [place]    Feeling of Stress : To some extent  Social Connections: Moderately Isolated (12/11/2021)   Social Connection and Isolation Panel [NHANES]    Frequency of Communication with Friends and Family: More than three times a week    Frequency of Social Gatherings with Friends and Family: More than three times a week    Attends Religious Services: Never    Marine scientist or Organizations: No    Attends Archivist Meetings: Never    Marital Status: Married  Human resources officer Violence: Not At  Risk (12/11/2021)   Humiliation, Afraid, Rape, and Kick questionnaire    Fear of Current or Ex-Partner: No    Emotionally Abused: No    Physically Abused: No    Sexually Abused: No    FAMILY HISTORY:  Family History  Problem Relation Age of Onset   Stroke Mother    Stroke Father    Hypertension Father    Early death Brother        16 months old   Cancer Brother        lung   Stroke Brother  heat    CURRENT MEDICATIONS:  Outpatient Encounter Medications as of 08/13/2022  Medication Sig   acetaminophen (TYLENOL) 500 MG tablet Take 1,000 mg by mouth every 6 (six) hours as needed for moderate pain.   CARBOPLATIN IV Inject into the vein every 21 ( twenty-one) days.   cetirizine-pseudoephedrine (ZYRTEC-D) 5-120 MG tablet Take once per day as need for congestion/runny nose   Cholecalciferol (VITAMIN D3) 5000 units CAPS Take 5,000 Units by mouth once a week.    diltiazem (CARDIZEM CD) 120 MG 24 hr capsule Take one capsule daily   ezetimibe (ZETIA) 10 MG tablet Take 1 tablet (10 mg total) by mouth daily.   finasteride (PROSCAR) 5 MG tablet Take one tablet each day   fluticasone (FLONASE) 50 MCG/ACT nasal spray Place 1 spray into both nostrils daily as needed for allergies or rhinitis.   furosemide (LASIX) 20 MG tablet Take 1 tablet (20 mg total) by mouth daily.   lidocaine-prilocaine (EMLA) cream Apply a small amount to port a cath site and cover with plastic wrap 1 hour prior to chemotherapy appointments (Patient not taking: Reported on 07/30/2022)   lovastatin (MEVACOR) 20 MG tablet TAKE 1/2 TABLET EVERY OTHER DAY   metoprolol tartrate (LOPRESSOR) 100 MG tablet Take 1 tablet (100 mg total) by mouth 2 (two) times daily.   PACLITAXEL IV Inject into the vein every 21 ( twenty-one) days.   PEMBROLIZUMAB IV Inject into the vein every 21 ( twenty-one) days.   prochlorperazine (COMPAZINE) 10 MG tablet Take 1 tablet (10 mg total) by mouth every 6 (six) hours as needed for nausea or  vomiting.   tobramycin (TOBREX) 0.3 % ophthalmic solution    warfarin (COUMADIN) 5 MG tablet TAKE 1 TABLET ON SAT, SUN, TUES, & THURSTAKE 1/2 TABLET ON MON, WED, & FRIDAY   No facility-administered encounter medications on file as of 08/13/2022.    ALLERGIES:  Allergies  Allergen Reactions   Penicillins Hives and Rash    Has patient had a PCN reaction causing immediate rash, facial/tongue/throat swelling, SOB or lightheadedness with hypotension: Yes Has patient had a PCN reaction causing severe rash involving mucus membranes or skin necrosis: Yes Has patient had a PCN reaction that required hospitalization: No Has patient had a PCN reaction occurring within the last 10 years: No If all of the above answers are "NO", then may proceed with Cephalosporin use.    Hct [Hydrochlorothiazide] Other (See Comments)    hyper   Statins Other (See Comments)    Myalgia, tolerates low dosages of lovastatin      PHYSICAL EXAM:  ECOG Performance status: 1  There were no vitals filed for this visit.  There were no vitals filed for this visit.  Physical Exam Vitals reviewed.  Constitutional:      Appearance: Normal appearance.  Cardiovascular:     Rate and Rhythm: Regular rhythm.     Heart sounds: Normal heart sounds.  Pulmonary:     Breath sounds: Normal breath sounds.  Neurological:     Mental Status: He is alert.  Psychiatric:        Mood and Affect: Mood normal.        Behavior: Behavior normal.    Neck and right axillary lymph nodes palpable.  LABORATORY DATA:  I have reviewed the labs as listed.  CBC    Component Value Date/Time   WBC 20.0 (H) 07/30/2022 1134   RBC 3.62 (L) 07/30/2022 1134   HGB 12.0 (L) 07/30/2022 1134   HGB  13.3 05/22/2022 0955   HCT 37.5 (L) 07/30/2022 1134   HCT 39.4 05/22/2022 0955   PLT 170 07/30/2022 1134   PLT 372 05/22/2022 0955   MCV 103.6 (H) 07/30/2022 1134   MCV 98 (H) 05/22/2022 0955   MCH 33.1 07/30/2022 1134   MCHC 32.0 07/30/2022  1134   RDW 13.2 07/30/2022 1134   RDW 11.6 05/22/2022 0955   LYMPHSABS 4.2 (H) 07/30/2022 1134   LYMPHSABS 5.1 (H) 05/22/2022 0955   MONOABS 1.2 (H) 07/30/2022 1134   EOSABS 0.6 (H) 07/30/2022 1134   EOSABS 0.3 05/22/2022 0955   BASOSABS 0.0 07/30/2022 1134   BASOSABS 0.1 05/22/2022 0955      Latest Ref Rng & Units 07/30/2022   11:34 AM 07/23/2022    8:08 AM 07/16/2022   12:50 PM  CMP  Glucose 70 - 99 mg/dL 161  100  92   BUN 8 - 23 mg/dL _0 Creatinine 0.61 - 1.24 mg/dL 1.02  0.94  0.98   Sodium 135 - 145 mmol/L 138  140  141   Potassium 3.5 - 5.1 mmol/L 3.7  3.8  4.4   Chloride 98 - 111 mmol/L 104  109  108   CO2 22 - 32 mmol/L _1 Calcium 8.9 - 10.3 mg/dL 8.7  8.9  9.0   Total Protein 6.5 - 8.1 g/dL 6.2  6.6  7.3   Total Bilirubin 0.3 - 1.2 mg/dL 0.6  0.7  0.8   Alkaline Phos 38 - 126 U/L 84  49  56   AST 15 - 41 U/L _2 ALT 0 - 44 U/L _3 DIAGNOSTIC IMAGING:  I have independently reviewed the scans and discussed with the patient.  ASSESSMENT: 1.  Right supraclavicular lymphadenopathy and multiple lung nodules: - Patient noticed right neck mass since Christmas 2022.  10 to 15 pound weight loss since January 2023.  Denies any dysphagia or odynophagia. - CT neck (05/27/2022): Numerous lymph nodes in the right neck, right supraclavicular lymph node mass measures 5.7 x 3.7 cm.  18 mm posterior lymph node in the right lower neck.  Numerous lymph nodes in the right lower and posterior neck approximately 1 cm.  Many have internal necrosis consistent with metastatic disease.  20 mm lymph node just above the right clavicle.  Left level 2 node 12 mm. - CT CAP (05/28/2022): Several bilateral lung nodules, RUL nodule measuring 1.8 x 1.3 cm.  Small clusters of nodules in the medial left upper lobe.  Irregular nodular density along the left side of the mediastinum measuring 2.1 cm.  No metastatic disease in the abdomen or pelvis.  Liver is slightly  nodular contour. - Lab work shows elevated white count since 2009, predominantly lymphocytosis. - Pathology (06/09/2022): Metastatic carcinoma positive for p40, p63 and CK5/6-patchy positivity with CK7.  Negative for TTF-1, Napsin A, PAX8, prostein, PSA, CK20 and CDX2.  - PET scan (06/11/2022): Bulky right supraclavicular nodal mass, smaller hypermetabolic right posterior triangle and jugular lymph nodes and single hypermetabolic left level 3 lymph node.  Bilateral hypermetabolic pulmonary nodules and metastatic pattern.  Minimal hypermetabolic mediastinal nodal metastasis.  Cluster of hypermetabolic right axillary lymph nodes.  No evidence of metastatic disease or primary lesion in the abdomen or pelvis.  No bone lesions. - MRI of the brain: Chronic small vessel ischemic changes with no evidence of metastatic  disease. - NGS: T p53 pathogenic variant, MSI-stable, TMB-low, LOH-low, PD-L1 (HX505): Negative, p16 and p18 negative - Cancer type ID: 96% probability squamous cell carcinoma, subtype head and neck/skin.  Cancer types ruled out with 95% confidence includes skin basal cell carcinoma. - Cycle 1 of carboplatin, Taxol and pembrolizumab on 07/23/2022  2.  Social/family history: - He lives at home with his wife.  Independent of ADLs and IADLs.  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  May have had asbestos exposure 30 years ago.  Quit smoking cigarettes in 1965.  Smoked 1 pack/day for less than 12 years.  2 of his brothers had lung cancer and both were smokers.   PLAN:  1.  Metastatic squamous cell carcinoma, presumed head and neck primary: - He has tolerated cycle 1 reasonably well. - CT soft tissue neck was done as he complained of difficulty swallowing.  This was done on 08/05/2022.  This showed positive treatment response 10 days after cycle 1. - Reviewed labs today which showed normal LFTs.  CBC was grossly normal.  Leukocytosis from Neulasta  present. - He developed burning pain in the lower extremities at nighttime lasted 2 to 3 days, started 2 to 3 days after chemotherapy.  Took Tylenol which helped.  He complained of tiredness all 3 weeks. - We will proceed with cycle 2 today without any dose modifications.  Continue dose reduced chemotherapy. - RTC 3 weeks for follow-up.  2.  Neuropathy: - She has developed numbness in the fingertips on and off which lasted few days after last cycle.  We will closely monitor.  Orders placed this encounter:  No orders of the defined types were placed in this encounter.     Derek Jack, MD [place] [phone]

## 2022-08-13 NOTE — Patient Instructions (Signed)
[facility]  Discharge Instructions: Thank you for choosing [facility] to provide your oncology and hematology care.  If you have a lab appointment with the [facility], please come in thru the Main Entrance and check in at the main information desk.  Wear comfortable clothing and clothing appropriate for easy access to any Portacath or PICC line.   We strive to give you quality time with your provider. You may need to reschedule your appointment if you arrive late (15 or more minutes).  Arriving late affects you and other patients whose appointments are after yours.  Also, if you miss three or more appointments without notifying the office, you may be dismissed from the clinic at the provider's discretion.      For prescription refill requests, have your pharmacy contact our office and allow 72 hours for refills to be completed.    Today you received the following chemotherapy and/or immunotherapy agents Keytruda, Paclitaxel and Carboplatin return as scheduled.   To help prevent nausea and vomiting after your treatment, we encourage you to take your nausea medication as directed.  BELOW ARE SYMPTOMS THAT SHOULD BE REPORTED IMMEDIATELY: *FEVER GREATER THAN 100.4 F (38 C) OR HIGHER *CHILLS OR SWEATING *NAUSEA AND VOMITING THAT IS NOT CONTROLLED WITH YOUR NAUSEA MEDICATION *UNUSUAL SHORTNESS OF BREATH *UNUSUAL BRUISING OR BLEEDING *URINARY PROBLEMS (pain or burning when urinating, or frequent urination) *BOWEL PROBLEMS (unusual diarrhea, constipation, pain near the anus) TENDERNESS IN MOUTH AND THROAT WITH OR WITHOUT PRESENCE OF ULCERS (sore throat, sores in mouth, or a toothache) UNUSUAL RASH, SWELLING OR PAIN  UNUSUAL VAGINAL DISCHARGE OR ITCHING   Items with * indicate a potential emergency and should be followed up as soon as possible or go to the Emergency Department if any problems should occur.  Please show the CHEMOTHERAPY ALERT CARD or  IMMUNOTHERAPY ALERT CARD at check-in to the Emergency Department and triage nurse.  Should you have questions after your visit or need to cancel or reschedule your appointment, please contact [facility] [phone]  and follow the prompts.  Office hours are 8:00 a.m. to 4:30 p.m. Monday - Friday. Please note that voicemails left after 4:00 p.m. may not be returned until the following business day.  We are closed weekends and major holidays. You have access to a nurse at all times for urgent questions. Please call the main number to the clinic [phone] and follow the prompts.  For any non-urgent questions, you may also contact your provider using MyChart. We now offer e-Visits for anyone 16 and older to request care online for non-urgent symptoms. For details visit [email or web address].   Also download the MyChart app! Go to the app store, search "MyChart", open the app, select [facility], and log in with your MyChart username and password.  Masks are optional in the cancer centers. If you would like for your care team to wear a mask while they are taking care of you, please let them know. You may have one support person who is at least 86 years old accompany you for your appointments.

## 2022-08-13 NOTE — Progress Notes (Signed)
Decrease diphenhydramine dose to 25 mg IV prior to chemotherapy for age related dosing  T.O. Dr Rhys Martini, PharmD

## 2022-08-13 NOTE — Patient Instructions (Addendum)
Cancer Center at [facility] Discharge Instructions   You were seen and examined today by Dr. Katragadda.  He reviewed the results of your lab work which are normal/stable.   We will proceed with your treatment today.  Return as scheduled.    Thank you for choosing  Cancer Center at [facility] to provide your oncology and hematology care.  To afford each patient quality time with our provider, please arrive at least 15 minutes before your scheduled appointment time.   If you have a lab appointment with the Cancer Center please come in thru the Main Entrance and check in at the main information desk.  You need to re-schedule your appointment should you arrive 10 or more minutes late.  We strive to give you quality time with our providers, and arriving late affects you and other patients whose appointments are after yours.  Also, if you no show three or more times for appointments you may be dismissed from the clinic at the providers discretion.     Again, thank you for choosing [facility].  Our hope is that these requests will decrease the amount of time that you wait before being seen by our physicians.       _____________________________________________________________  Should you have questions after your visit to [facility], please contact our office at [phone] and follow the prompts.  Our office hours are 8:00 a.m. and 4:30 p.m. Monday - Friday.  Please note that voicemails left after 4:00 p.m. may not be returned until the following business day.  We are closed weekends and major holidays.  You do have access to a nurse 24-7, just call the main number to the clinic [phone] and do not press any options, hold on the line and a nurse will answer the phone.    For prescription refill requests, have your pharmacy contact our office and allow 72 hours.    Due to Covid, you will need to wear a mask upon entering  the hospital. If you do not have a mask, a mask will be given to you at the Main Entrance upon arrival. For doctor visits, patients may have 1 support person age 18 or older with them. For treatment visits, patients can not have anyone with them due to social distancing guidelines and our immunocompromised population.      

## 2022-08-14 ENCOUNTER — Inpatient Hospital Stay: Payer: Medicare Other

## 2022-08-14 VITALS — BP 128/74 | HR 74 | Temp 97.4°F | Resp 18

## 2022-08-14 DIAGNOSIS — C771 Secondary and unspecified malignant neoplasm of intrathoracic lymph nodes: Secondary | ICD-10-CM | POA: Diagnosis not present

## 2022-08-14 DIAGNOSIS — Z5111 Encounter for antineoplastic chemotherapy: Secondary | ICD-10-CM | POA: Diagnosis not present

## 2022-08-14 DIAGNOSIS — R918 Other nonspecific abnormal finding of lung field: Secondary | ICD-10-CM | POA: Diagnosis not present

## 2022-08-14 DIAGNOSIS — Z5189 Encounter for other specified aftercare: Secondary | ICD-10-CM | POA: Diagnosis not present

## 2022-08-14 DIAGNOSIS — C760 Malignant neoplasm of head, face and neck: Secondary | ICD-10-CM | POA: Diagnosis not present

## 2022-08-14 DIAGNOSIS — I10 Essential (primary) hypertension: Secondary | ICD-10-CM | POA: Diagnosis not present

## 2022-08-14 DIAGNOSIS — Z5112 Encounter for antineoplastic immunotherapy: Secondary | ICD-10-CM | POA: Diagnosis not present

## 2022-08-14 DIAGNOSIS — Z87891 Personal history of nicotine dependence: Secondary | ICD-10-CM | POA: Diagnosis not present

## 2022-08-14 DIAGNOSIS — Z95828 Presence of other vascular implants and grafts: Secondary | ICD-10-CM

## 2022-08-14 DIAGNOSIS — Z801 Family history of malignant neoplasm of trachea, bronchus and lung: Secondary | ICD-10-CM | POA: Diagnosis not present

## 2022-08-14 DIAGNOSIS — G629 Polyneuropathy, unspecified: Secondary | ICD-10-CM | POA: Diagnosis not present

## 2022-08-14 MED ORDER — PEGFILGRASTIM-CBQV 6 MG/0.6ML ~~LOC~~ SOSY
6.0000 mg | PREFILLED_SYRINGE | Freq: Once | SUBCUTANEOUS | Status: AC
  Administered 2022-08-14: 6 mg via SUBCUTANEOUS
  Filled 2022-08-14: qty 0.6

## 2022-08-14 NOTE — Progress Notes (Signed)
Patient tolerated injection with no complaints voiced. Site clean and dry with no bruising or swelling noted at site. See MAR for details. Band aid applied.  Patient stable during and after injection. VSS with discharge and left in satisfactory condition with no s/s of distress noted.  

## 2022-08-14 NOTE — Patient Instructions (Signed)
[facility]  Discharge Instructions: Thank you for choosing [facility] to provide your oncology and hematology care.  If you have a lab appointment with the [facility], please come in thru the Main Entrance and check in at the main information desk.  Wear comfortable clothing and clothing appropriate for easy access to any Portacath or PICC line.   We strive to give you quality time with your provider. You may need to reschedule your appointment if you arrive late (15 or more minutes).  Arriving late affects you and other patients whose appointments are after yours.  Also, if you miss three or more appointments without notifying the office, you may be dismissed from the clinic at the provider's discretion.      For prescription refill requests, have your pharmacy contact our office and allow 72 hours for refills to be completed.    Today you received the following Udenyca injection, return as scheduled.  To help prevent nausea and vomiting after your treatment, we encourage you to take your nausea medication as directed.  BELOW ARE SYMPTOMS THAT SHOULD BE REPORTED IMMEDIATELY: *FEVER GREATER THAN 100.4 F (38 C) OR HIGHER *CHILLS OR SWEATING *NAUSEA AND VOMITING THAT IS NOT CONTROLLED WITH YOUR NAUSEA MEDICATION *UNUSUAL SHORTNESS OF BREATH *UNUSUAL BRUISING OR BLEEDING *URINARY PROBLEMS (pain or burning when urinating, or frequent urination) *BOWEL PROBLEMS (unusual diarrhea, constipation, pain near the anus) TENDERNESS IN MOUTH AND THROAT WITH OR WITHOUT PRESENCE OF ULCERS (sore throat, sores in mouth, or a toothache) UNUSUAL RASH, SWELLING OR PAIN  UNUSUAL VAGINAL DISCHARGE OR ITCHING   Items with * indicate a potential emergency and should be followed up as soon as possible or go to the Emergency Department if any problems should occur.  Please show the CHEMOTHERAPY ALERT CARD or IMMUNOTHERAPY ALERT CARD at check-in to the Emergency Department  and triage nurse.  Should you have questions after your visit or need to cancel or reschedule your appointment, please contact [facility] [phone]  and follow the prompts.  Office hours are 8:00 a.m. to 4:30 p.m. Monday - Friday. Please note that voicemails left after 4:00 p.m. may not be returned until the following business day.  We are closed weekends and major holidays. You have access to a nurse at all times for urgent questions. Please call the main number to the clinic [phone] and follow the prompts.  For any non-urgent questions, you may also contact your provider using MyChart. We now offer e-Visits for anyone 34 and older to request care online for non-urgent symptoms. For details visit [email or web address].   Also download the MyChart app! Go to the app store, search "MyChart", open the app, select [facility], and log in with your MyChart username and password.  Masks are optional in the cancer centers. If you would like for your care team to wear a mask while they are taking care of you, please let them know. You may have one support person who is at least 86 years old accompany you for your appointments.

## 2022-08-24 ENCOUNTER — Encounter: Payer: Self-pay | Admitting: Family Medicine

## 2022-08-24 ENCOUNTER — Ambulatory Visit (INDEPENDENT_AMBULATORY_CARE_PROVIDER_SITE_OTHER): Payer: Medicare Other | Admitting: Family Medicine

## 2022-08-24 VITALS — BP 153/88 | HR 75 | Temp 97.0°F | Ht 66.5 in | Wt 185.0 lb

## 2022-08-24 DIAGNOSIS — I10 Essential (primary) hypertension: Secondary | ICD-10-CM | POA: Diagnosis not present

## 2022-08-24 DIAGNOSIS — E785 Hyperlipidemia, unspecified: Secondary | ICD-10-CM

## 2022-08-24 DIAGNOSIS — Z7901 Long term (current) use of anticoagulants: Secondary | ICD-10-CM | POA: Diagnosis not present

## 2022-08-24 DIAGNOSIS — R7303 Prediabetes: Secondary | ICD-10-CM | POA: Diagnosis not present

## 2022-08-24 DIAGNOSIS — I4819 Other persistent atrial fibrillation: Secondary | ICD-10-CM | POA: Diagnosis not present

## 2022-08-24 LAB — LIPID PANEL
Chol/HDL Ratio: 3.3 ratio (ref 0.0–5.0)
Cholesterol, Total: 142 mg/dL (ref 100–199)
HDL: 43 mg/dL (ref >39)
LDL Chol Calc (NIH): 77 mg/dL (ref 0–99)
Triglycerides: 123 mg/dL (ref 0–149)
VLDL Cholesterol Cal: 22 mg/dL (ref 5–40)

## 2022-08-24 LAB — COAGUCHEK XS/INR WAIVED
INR: 1.7 — ABNORMAL HIGH (ref 0.9–1.1)
Prothrombin Time: 20.5 s

## 2022-08-24 NOTE — Progress Notes (Signed)
BP (!) 153/88   Pulse 75   Temp (!) 97 F (36.1 C)   Ht 5' 6.5" (1.689 m)   Wt 185 lb (83.9 kg)   SpO2 96%   BMI 29.41 kg/m    Subjective:   Patient ID: [name], male    DOB: 1932/06/23, 88 y.o.   MRN: [id number]  HPI: [name] is a 88 y.o. male presenting on 08/24/2022 for Medical Management of Chronic Issues and Atrial Fibrillation   HPI Hypertension Patient is currently on diltiazem and furosemide and metoprolol, and their blood pressure today is 153/88. Patient denies any lightheadedness or dizziness. Patient denies headaches, blurred vision, chest pains, shortness of breath, or weakness. Denies any side effects from medication and is content with current medication.   Hyperlipidemia Patient is coming in for recheck of his hyperlipidemia. The patient is currently taking lovastatin. They deny any issues with myalgias or history of liver damage from it. They deny any focal numbness or weakness or chest pain.   Coumadin recheck Target goal: 2.0-3.0 Reason on anticoagulation: A-fib Patient denies any bruising or bleeding or chest pain or palpitations   Relevant past medical, surgical, family and social history reviewed and updated as indicated. Interim medical history since our last visit reviewed. Allergies and medications reviewed and updated.  Review of Systems  Constitutional:  Negative for chills and fever.  Eyes:  Negative for visual disturbance.  Respiratory:  Negative for shortness of breath and wheezing.   Cardiovascular:  Negative for chest pain and leg swelling.  Gastrointestinal:  Negative for blood in stool.  Genitourinary:  Negative for hematuria.  Musculoskeletal:  Negative for back pain and gait problem.  Skin:  Negative for rash.  All other systems reviewed and are negative.   Per HPI unless specifically indicated above   Allergies as of 08/24/2022       Reactions   Penicillins Hives, Rash   Has patient had a PCN reaction causing  immediate rash, facial/tongue/throat swelling, SOB or lightheadedness with hypotension: Yes Has patient had a PCN reaction causing severe rash involving mucus membranes or skin necrosis: Yes Has patient had a PCN reaction that required hospitalization: No Has patient had a PCN reaction occurring within the last 10 years: No If all of the above answers are "NO", then may proceed with Cephalosporin use.   Hct [hydrochlorothiazide] Other (See Comments)   hyper   Statins Other (See Comments)   Myalgia, tolerates low dosages of lovastatin         Medication List        Accurate as of August 24, 2022 10:39 AM. If you have any questions, ask your nurse or doctor.          acetaminophen 500 MG tablet Commonly known as: TYLENOL Take 1,000 mg by mouth every 6 (six) hours as needed for moderate pain.   CARBOPLATIN IV Inject into the vein every 21 ( twenty-one) days.   cetirizine-pseudoephedrine 5-120 MG tablet Commonly known as: ZYRTEC-D Take once per day as need for congestion/runny nose   diltiazem 120 MG 24 hr capsule Commonly known as: CARDIZEM CD Take one capsule daily   ezetimibe 10 MG tablet Commonly known as: ZETIA Take 1 tablet (10 mg total) by mouth daily.   finasteride 5 MG tablet Commonly known as: PROSCAR Take one tablet each day   fluticasone 50 MCG/ACT nasal spray Commonly known as: FLONASE Place 1 spray into both nostrils daily as needed for allergies or rhinitis.  furosemide 20 MG tablet Commonly known as: LASIX Take 1 tablet (20 mg total) by mouth daily.   lidocaine-prilocaine cream Commonly known as: EMLA Apply a small amount to port a cath site and cover with plastic wrap 1 hour prior to chemotherapy appointments   lovastatin 20 MG tablet Commonly known as: MEVACOR TAKE 1/2 TABLET EVERY OTHER DAY   metoprolol tartrate 100 MG tablet Commonly known as: LOPRESSOR Take 1 tablet (100 mg total) by mouth 2 (two) times daily.   montelukast 10 MG  tablet Commonly known as: SINGULAIR Take 10 mg by mouth daily.   PACLITAXEL IV Inject into the vein every 21 ( twenty-one) days.   PEMBROLIZUMAB IV Inject into the vein every 21 ( twenty-one) days.   prochlorperazine 10 MG tablet Commonly known as: COMPAZINE Take 1 tablet (10 mg total) by mouth every 6 (six) hours as needed for nausea or vomiting.   tobramycin 0.3 % ophthalmic solution Commonly known as: TOBREX   Vitamin D3 125 MCG (5000 UT) Caps Take 5,000 Units by mouth once a week.   warfarin 5 MG tablet Commonly known as: COUMADIN Take as directed by the anticoagulation clinic. If you are unsure how to take this medication, talk to your nurse or doctor. Original instructions: TAKE 1 TABLET ON SAT, SUN, TUES, & THURSTAKE 1/2 TABLET ON MON, WED, & FRIDAY         Objective:   BP (!) 153/88   Pulse 75   Temp (!) 97 F (36.1 C)   Ht 5' 6.5" (1.689 m)   Wt 185 lb (83.9 kg)   SpO2 96%   BMI 29.41 kg/m   Wt Readings from Last 3 Encounters:  08/24/22 185 lb (83.9 kg)  08/13/22 186 lb 9.6 oz (84.6 kg)  07/30/22 187 lb (84.8 kg)    Physical Exam Vitals and nursing note reviewed.  Constitutional:      General: He is not in acute distress.    Appearance: He is well-developed. He is not diaphoretic.  Eyes:     General: No scleral icterus.    Conjunctiva/sclera: Conjunctivae normal.  Neck:     Thyroid: No thyromegaly.  Cardiovascular:     Rate and Rhythm: Normal rate and regular rhythm.     Heart sounds: Normal heart sounds. No murmur heard. Pulmonary:     Effort: Pulmonary effort is normal. No respiratory distress.     Breath sounds: Normal breath sounds. No wheezing.  Musculoskeletal:     Cervical back: Neck supple.  Lymphadenopathy:     Cervical: No cervical adenopathy.  Skin:    General: Skin is warm and dry.     Findings: No rash.  Neurological:     Mental Status: He is alert and oriented to person, place, and time.     Coordination: Coordination  normal.  Psychiatric:        Behavior: Behavior normal.       Assessment & Plan:   Problem List Items Addressed This Visit       Cardiovascular and Mediastinum   Essential hypertension   Atrial fibrillation, persistent (HCC) - Primary   Relevant Orders   CoaguChek XS/INR Waived     Other   Dyslipidemia   Relevant Orders   Lipid panel   Prediabetes   Long term current use of anticoagulant therapy    Description   Take an extra half a tablet today and then increase weekly dose by taking 1 tablet every day except for Thursdays and Saturdays  take 1/2 tablet  INR today is  1.7 (goal is 2-3)    Recheck in 4-6 weeks    Still undergoing chemotherapy, seems to be doing well.  Blood pressure elevated today but has been running good for chemotherapy and at home.  Typically runs in the 130s over 70s.  Follow up plan: Return if symptoms worsen or fail to improve, for 4 to 6-week INR.  Counseling provided for all of the vaccine components Orders Placed This Encounter  Procedures   Lipid panel   CoaguChek XS/INR [place] Joanell Cressler, MD [facility] 08/24/2022, 10:39 AM

## 2022-08-25 ENCOUNTER — Other Ambulatory Visit: Payer: Self-pay

## 2022-09-06 NOTE — Progress Notes (Signed)
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  PRIOR THERAPY: None  NGS Results: Tp53 mutation positive, MSI-stable, TMB-low, PD-L1 (CH885): Negative  CURRENT THERAPY: Carboplatin, paclitaxel and pembrolizumab  BRIEF ONCOLOGIC HISTORY:  Oncology History  Squamous cell carcinoma of head and neck ([place])  07/16/2022 Initial Diagnosis   Squamous cell carcinoma of head and neck ([place])   07/23/2022 -  Chemotherapy   Patient is on Treatment Plan : Carboplatin  + Paclitaxel  + Pembrolizumab (200) D1 q21d       CANCER STAGING:  Cancer Staging  Squamous cell carcinoma of head and neck ([place]) Staging form: Cervical Lymph Nodes and Unknown Primary Tumors of the Head and Neck, AJCC 8th Edition - Clinical stage from 07/16/2022: Stage IVC (cT0, cN3b, cM1) - Unsigned    INTERVAL HISTORY:  [name] 88 y.o. male seen for follow-up of squamous cell carcinoma, unknown primary.  He has tolerated second cycle reasonably well.  Reported some fatigue which lasted about 5 to 6 days.  No nausea or vomiting reported.  He reported the joints hurting 2 days after treatment lasting about 2 to 3 days mostly in the knees on and off.  Tylenol helped.  Also reported fingertip numbness on and off.    REVIEW OF SYSTEMS:  Review of Systems  Musculoskeletal:  Positive for arthralgias (Knees lasting 2 to 3 days).  Neurological:  Positive for dizziness, headaches and numbness (On and off numbness in the fingertips).  All other systems reviewed and are negative.    PAST MEDICAL/SURGICAL HISTORY:  Past Medical History:  Diagnosis Date   Anemia    Atrial fibrillation (HCC)    Cataract    Cellulitis    In past   Complication of anesthesia    HARD TIME WAKING; CAUSES MY BP TO GO UP     Coronary artery disease    5 bypasses   DJD (degenerative joint disease)    Ejection fraction < 50%    Mildly reduced, 40% by echo   GERD (gastroesophageal reflux disease)    Hyperlipidemia    Hypertension    Persistent atrial fibrillation ([place])    Prostate hypertrophy    on CT scan 09/2014   PUD (peptic ulcer disease)    RESOLVED    Skin cancer of eyelid    Resected   Past Surgical History:  Procedure Laterality Date   APPENDECTOMY     BYPASS GRAFT  2005   CATARACT EXTRACTION W/PHACO Left 07/22/2015   Procedure: CATARACT EXTRACTION PHACO AND INTRAOCULAR LENS PLACEMENT LEFT EYE CDE=8.14;  Surgeon: Tonny Branch, MD;  Location: [facility];  Service: Ophthalmology;  Laterality: Left;   CATARACT EXTRACTION W/PHACO Right 08/18/2019   Procedure: CATARACT EXTRACTION PHACO AND INTRAOCULAR LENS PLACEMENT (IOC);  Surgeon: Baruch Goldmann, MD;  Location: [facility];  Service: Ophthalmology;  Laterality: Right;  CDE: 9.74   CORONARY ARTERY BYPASS GRAFT  4/05   LIMA to LAD, SVG to PDA, SVG to ramus intermediate   EYE SURGERY  07/2015   EYELID CARCINOMA EXCISION     Skin cancer resected   Heart bypass  2005   LYMPH NODE BIOPSY Right 06/09/2022   Procedure: LYMPH NODE BIOPSY; supraclavicular;  Surgeon: Virl Cagey, MD;  Location: [facility];  Service: General;  Laterality: Right;  PORTACATH PLACEMENT Left 06/09/2022   Procedure: INSERTION PORT-A-CATH;  Surgeon: Virl Cagey, MD;  Location: [facility];  Service: General;  Laterality: Left;   TOTAL HIP ARTHROPLASTY     Right   TOTAL HIP ARTHROPLASTY Left 05/04/2018   Procedure: LEFT TOTAL HIP ARTHROPLASTY;  Surgeon: [name], MD;  Location: [facility];  Service: Orthopedics;  Laterality: Left;   TOTAL KNEE ARTHROPLASTY     Right     SOCIAL HISTORY:  Social History   Socioeconomic History   Marital status: Married    Spouse name: [name]   Number of children: 1   Years of education: 10   Highest education level: 10th grade  Occupational  History   Occupation: retired  Tobacco Use   Smoking status: Former    Packs/day: 2.00    Years: 5.00    Total pack years: 10.00    Types: Cigarettes    Quit date: 12/23/1963    Years since quitting: 58.7   Smokeless tobacco: Never  Vaping Use   Vaping Use: Never used  Substance and Sexual Activity   Alcohol use: No   Drug use: No   Sexual activity: Yes  Other Topics Concern   Not on file  Social History Narrative   Lives in a split level home.   Social Determinants of Health   Financial Resource Strain: Low Risk  (12/11/2021)   Overall Financial Resource Strain (CARDIA)    Difficulty of Paying Living Expenses: Not hard at all  Food Insecurity: No Food Insecurity (12/11/2021)   Hunger Vital Sign    Worried About Running Out of Food in the Last Year: Never true    Ran Out of Food in the Last Year: Never true  Transportation Needs: No Transportation Needs (12/11/2021)   PRAPARE - Hydrologist (Medical): No    Lack of Transportation (Non-Medical): No  Physical Activity: Insufficiently Active (12/11/2021)   Exercise Vital Sign    Days of Exercise per Week: 7 days    Minutes of Exercise per Session: 20 min  Stress: No Stress Concern Present (12/11/2021)   [place]    Feeling of Stress : Only a little  Recent Concern: Stress - Stress Concern Present (10/31/2021)   [place]    Feeling of Stress : To some extent  Social Connections: Moderately Isolated (12/11/2021)   Social Connection and Isolation Panel [NHANES]    Frequency of Communication with Friends and Family: More than three times a week    Frequency of Social Gatherings with Friends and Family: More than three times a week    Attends Religious Services: Never    Marine scientist or Organizations: No    Attends Archivist Meetings: Never    Marital  Status: Married  Human resources officer Violence: Not At Risk (12/11/2021)   Humiliation, Afraid, Rape, and Kick questionnaire    Fear of Current or Ex-Partner: No    Emotionally Abused: No    Physically Abused: No    Sexually Abused: No    FAMILY HISTORY:  Family History  Problem Relation Age of Onset   Stroke Mother    Stroke Father    Hypertension Father    Early death Brother        25 months old   Cancer Brother        lung   Stroke Brother  heat    CURRENT MEDICATIONS:  Outpatient Encounter Medications as of 09/07/2022  Medication Sig   acetaminophen (TYLENOL) 500 MG tablet Take 1,000 mg by mouth every 6 (six) hours as needed for moderate pain.   CARBOPLATIN IV Inject into the vein every 21 ( twenty-one) days.   cetirizine-pseudoephedrine (ZYRTEC-D) 5-120 MG tablet Take once per day as need for congestion/runny nose   Cholecalciferol (VITAMIN D3) 5000 units CAPS Take 5,000 Units by mouth once a week.    diltiazem (CARDIZEM CD) 120 MG 24 hr capsule Take one capsule daily   ezetimibe (ZETIA) 10 MG tablet Take 1 tablet (10 mg total) by mouth daily.   finasteride (PROSCAR) 5 MG tablet Take one tablet each day   fluticasone (FLONASE) 50 MCG/ACT nasal spray Place 1 spray into both nostrils daily as needed for allergies or rhinitis.   furosemide (LASIX) 20 MG tablet Take 1 tablet (20 mg total) by mouth daily.   lidocaine-prilocaine (EMLA) cream Apply a small amount to port a cath site and cover with plastic wrap 1 hour prior to chemotherapy appointments   lovastatin (MEVACOR) 20 MG tablet TAKE 1/2 TABLET EVERY OTHER DAY   metoprolol tartrate (LOPRESSOR) 100 MG tablet Take 1 tablet (100 mg total) by mouth 2 (two) times daily.   montelukast (SINGULAIR) 10 MG tablet Take 10 mg by mouth daily.   PACLITAXEL IV Inject into the vein every 21 ( twenty-one) days.   PEMBROLIZUMAB IV Inject into the vein every 21 ( twenty-one) days.   prochlorperazine (COMPAZINE) 10 MG tablet Take 1 tablet  (10 mg total) by mouth every 6 (six) hours as needed for nausea or vomiting.   tobramycin (TOBREX) 0.3 % ophthalmic solution    warfarin (COUMADIN) 5 MG tablet TAKE 1 TABLET ON SAT, SUN, TUES, & THURSTAKE 1/2 TABLET ON MON, WED, & FRIDAY   No facility-administered encounter medications on file as of 09/07/2022.    ALLERGIES:  Allergies  Allergen Reactions   Penicillins Hives and Rash    Has patient had a PCN reaction causing immediate rash, facial/tongue/throat swelling, SOB or lightheadedness with hypotension: Yes Has patient had a PCN reaction causing severe rash involving mucus membranes or skin necrosis: Yes Has patient had a PCN reaction that required hospitalization: No Has patient had a PCN reaction occurring within the last 10 years: No If all of the above answers are "NO", then may proceed with Cephalosporin use.    Hct [Hydrochlorothiazide] Other (See Comments)    hyper   Statins Other (See Comments)    Myalgia, tolerates low dosages of lovastatin      PHYSICAL EXAM:  ECOG Performance status: 1  There were no vitals filed for this visit.  There were no vitals filed for this visit.  Physical Exam Vitals reviewed.  Constitutional:      Appearance: Normal appearance.  Cardiovascular:     Rate and Rhythm: Regular rhythm.     Heart sounds: Normal heart sounds.  Pulmonary:     Breath sounds: Normal breath sounds.  Neurological:     Mental Status: He is alert.  Psychiatric:        Mood and Affect: Mood normal.        Behavior: Behavior normal.    Neck and right axillary lymph nodes palpable.  LABORATORY DATA:  I have reviewed the labs as listed.  CBC    Component Value Date/Time   WBC 12.7 (H) 08/13/2022 0817   RBC 3.64 (L) 08/13/2022 0817   HGB  12.2 (L) 08/13/2022 0817   HGB 13.3 05/22/2022 0955   HCT 37.4 (L) 08/13/2022 0817   HCT 39.4 05/22/2022 0955   PLT 405 (H) 08/13/2022 0817   PLT 372 05/22/2022 0955   MCV 102.7 (H) 08/13/2022 0817   MCV 98  (H) 05/22/2022 0955   MCH 33.5 08/13/2022 0817   MCHC 32.6 08/13/2022 0817   RDW 13.5 08/13/2022 0817   RDW 11.6 05/22/2022 0955   LYMPHSABS 3.5 08/13/2022 0817   LYMPHSABS 5.1 (H) 05/22/2022 0955   MONOABS 1.0 08/13/2022 0817   EOSABS 0.1 08/13/2022 0817   EOSABS 0.3 05/22/2022 0955   BASOSABS 0.1 08/13/2022 0817   BASOSABS 0.1 05/22/2022 0955      Latest Ref Rng & Units 08/13/2022    8:17 AM 07/30/2022   11:34 AM 07/23/2022    8:08 AM  CMP  Glucose 70 - 99 mg/dL 105  161  100   BUN 8 - 23 mg/dL _0 Creatinine 0.61 - 1.24 mg/dL 0.88  1.02  0.94   Sodium 135 - 145 mmol/L 138  138  140   Potassium 3.5 - 5.1 mmol/L 4.0  3.7  3.8   Chloride 98 - 111 mmol/L 104  104  109   CO2 22 - 32 mmol/L _1 Calcium 8.9 - 10.3 mg/dL 8.7  8.7  8.9   Total Protein 6.5 - 8.1 g/dL 6.8  6.2  6.6   Total Bilirubin 0.3 - 1.2 mg/dL 0.8  0.6  0.7   Alkaline Phos 38 - 126 U/L 72  84  49   AST 15 - 41 U/L _2 ALT 0 - 44 U/L _3 DIAGNOSTIC IMAGING:  I have independently reviewed the scans and discussed with the patient.  ASSESSMENT: 1.  Right supraclavicular lymphadenopathy and multiple lung nodules: - Patient noticed right neck mass since Christmas 2022.  10 to 15 pound weight loss since January 2023.  Denies any dysphagia or odynophagia. - CT neck (05/27/2022): Numerous lymph nodes in the right neck, right supraclavicular lymph node mass measures 5.7 x 3.7 cm.  18 mm posterior lymph node in the right lower neck.  Numerous lymph nodes in the right lower and posterior neck approximately 1 cm.  Many have internal necrosis consistent with metastatic disease.  20 mm lymph node just above the right clavicle.  Left level 2 node 12 mm. - CT CAP (05/28/2022): Several bilateral lung nodules, RUL nodule measuring 1.8 x 1.3 cm.  Small clusters of nodules in the medial left upper lobe.  Irregular nodular density along the left side of the mediastinum measuring 2.1 cm.  No  metastatic disease in the abdomen or pelvis.  Liver is slightly nodular contour. - Lab work shows elevated white count since 2009, predominantly lymphocytosis. - Pathology (06/09/2022): Metastatic carcinoma positive for p40, p63 and CK5/6-patchy positivity with CK7.  Negative for TTF-1, Napsin A, PAX8, prostein, PSA, CK20 and CDX2.  - PET scan (06/11/2022): Bulky right supraclavicular nodal mass, smaller hypermetabolic right posterior triangle and jugular lymph nodes and single hypermetabolic left level 3 lymph node.  Bilateral hypermetabolic pulmonary nodules and metastatic pattern.  Minimal hypermetabolic mediastinal nodal metastasis.  Cluster of hypermetabolic right axillary lymph nodes.  No evidence of metastatic disease or primary lesion in the abdomen or pelvis.  No bone lesions. - MRI of the brain: Chronic small vessel ischemic  changes with no evidence of metastatic disease. - NGS: T p53 pathogenic variant, MSI-stable, TMB-low, LOH-low, PD-L1 (NW509): Negative, p16 and p18 negative - Cancer type ID: 96% probability squamous cell carcinoma, subtype head and neck/skin.  Cancer types ruled out with 95% confidence includes skin basal cell carcinoma. - Cycle 1 of carboplatin, Taxol and pembrolizumab on 07/23/2022  2.  Social/family history: - He lives at home with his wife.  Independent of ADLs and IADLs.  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  May have had asbestos exposure 30 years ago.  Quit smoking cigarettes in 1965.  Smoked 1 pack/day for less than 12 years.  2 of his brothers had lung cancer and both were smokers.   PLAN:  1.  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 - He had fatigue which lasted 5 to 6 days after last treatment.  No GI side effects. - Reported knee pains started 2 days after treatment, lasted about 2 to 3 days on and off, worst at night, Tylenol helped. -  Reviewed labs today which showed normal LFTs and creatinine.  CBC was grossly normal.  TSH was 1.1. - Proceed with cycle 3 today with the same doses. - Recommend taking Claritin for 5 days to see if it helps with joint pains. - RTC 3 weeks for follow-up.  Recommend repeating CT of the neck and chest prior to next visit.  2.  Neuropathy: - He has on and off numbness in the fingertips which is stable.  Will closely monitor.  He is having difficulty opening bottle caps.  Orders placed this encounter:  No orders of the defined types were placed in this encounter.     Derek Jack, MD [place] [phone]

## 2022-09-07 ENCOUNTER — Other Ambulatory Visit: Payer: Self-pay | Admitting: Family Medicine

## 2022-09-07 ENCOUNTER — Inpatient Hospital Stay: Payer: Medicare Other | Admitting: Hematology

## 2022-09-07 ENCOUNTER — Inpatient Hospital Stay: Payer: Medicare Other

## 2022-09-07 VITALS — BP 134/94 | HR 85 | Temp 96.9°F | Resp 18

## 2022-09-07 DIAGNOSIS — Z95828 Presence of other vascular implants and grafts: Secondary | ICD-10-CM

## 2022-09-07 DIAGNOSIS — C760 Malignant neoplasm of head, face and neck: Secondary | ICD-10-CM | POA: Diagnosis not present

## 2022-09-07 DIAGNOSIS — G629 Polyneuropathy, unspecified: Secondary | ICD-10-CM | POA: Diagnosis not present

## 2022-09-07 DIAGNOSIS — Z87891 Personal history of nicotine dependence: Secondary | ICD-10-CM | POA: Diagnosis not present

## 2022-09-07 DIAGNOSIS — R918 Other nonspecific abnormal finding of lung field: Secondary | ICD-10-CM | POA: Diagnosis not present

## 2022-09-07 DIAGNOSIS — Z5189 Encounter for other specified aftercare: Secondary | ICD-10-CM | POA: Diagnosis not present

## 2022-09-07 DIAGNOSIS — C771 Secondary and unspecified malignant neoplasm of intrathoracic lymph nodes: Secondary | ICD-10-CM | POA: Diagnosis not present

## 2022-09-07 DIAGNOSIS — Z5112 Encounter for antineoplastic immunotherapy: Secondary | ICD-10-CM | POA: Diagnosis not present

## 2022-09-07 DIAGNOSIS — I10 Essential (primary) hypertension: Secondary | ICD-10-CM | POA: Diagnosis not present

## 2022-09-07 DIAGNOSIS — Z5111 Encounter for antineoplastic chemotherapy: Secondary | ICD-10-CM | POA: Diagnosis not present

## 2022-09-07 DIAGNOSIS — Z801 Family history of malignant neoplasm of trachea, bronchus and lung: Secondary | ICD-10-CM | POA: Diagnosis not present

## 2022-09-07 LAB — CBC WITH DIFFERENTIAL/PLATELET
Abs Immature Granulocytes: 0.04 10*3/uL (ref 0.00–0.07)
Basophils Absolute: 0.1 10*3/uL (ref 0.0–0.1)
Basophils Relative: 1 %
Eosinophils Absolute: 0.1 10*3/uL (ref 0.0–0.5)
Eosinophils Relative: 1 %
HCT: 36.4 % — ABNORMAL LOW (ref 39.0–52.0)
Hemoglobin: 11.8 g/dL — ABNORMAL LOW (ref 13.0–17.0)
Immature Granulocytes: 0 %
Lymphocytes Relative: 28 %
Lymphs Abs: 3.3 10*3/uL (ref 0.7–4.0)
MCH: 34.1 pg — ABNORMAL HIGH (ref 26.0–34.0)
MCHC: 32.4 g/dL (ref 30.0–36.0)
MCV: 105.2 fL — ABNORMAL HIGH (ref 80.0–100.0)
Monocytes Absolute: 0.9 10*3/uL (ref 0.1–1.0)
Monocytes Relative: 7 %
Neutro Abs: 7.2 10*3/uL (ref 1.7–7.7)
Neutrophils Relative %: 63 %
Platelets: 295 10*3/uL (ref 150–400)
RBC: 3.46 MIL/uL — ABNORMAL LOW (ref 4.22–5.81)
RDW: 14.4 % (ref 11.5–15.5)
WBC: 11.6 10*3/uL — ABNORMAL HIGH (ref 4.0–10.5)
nRBC: 0 % (ref 0.0–0.2)

## 2022-09-07 LAB — COMPREHENSIVE METABOLIC PANEL
ALT: 13 U/L (ref 0–44)
AST: 16 U/L (ref 15–41)
Albumin: 3.5 g/dL (ref 3.5–5.0)
Alkaline Phosphatase: 65 U/L (ref 38–126)
Anion gap: 5 (ref 5–15)
BUN: 19 mg/dL (ref 8–23)
CO2: 26 mmol/L (ref 22–32)
Calcium: 8.7 mg/dL — ABNORMAL LOW (ref 8.9–10.3)
Chloride: 107 mmol/L (ref 98–111)
Creatinine, Ser: 0.99 mg/dL (ref 0.61–1.24)
GFR, Estimated: >60 mL/min (ref >60)
Glucose, Bld: 144 mg/dL — ABNORMAL HIGH (ref 70–99)
Potassium: 3.8 mmol/L (ref 3.5–5.1)
Sodium: 138 mmol/L (ref 135–145)
Total Bilirubin: 0.5 mg/dL (ref 0.3–1.2)
Total Protein: 6.2 g/dL — ABNORMAL LOW (ref 6.5–8.1)

## 2022-09-07 LAB — TSH: TSH: 1.143 u[IU]/mL (ref 0.350–4.500)

## 2022-09-07 MED ORDER — PALONOSETRON HCL INJECTION 0.25 MG/5ML
0.2500 mg | Freq: Once | INTRAVENOUS | Status: AC
  Administered 2022-09-07: 0.25 mg via INTRAVENOUS
  Filled 2022-09-07: qty 5

## 2022-09-07 MED ORDER — SODIUM CHLORIDE 0.9 % IV SOLN
10.0000 mg | Freq: Once | INTRAVENOUS | Status: AC
  Administered 2022-09-07: 10 mg via INTRAVENOUS
  Filled 2022-09-07: qty 10

## 2022-09-07 MED ORDER — DIPHENHYDRAMINE HCL 50 MG/ML IJ SOLN
25.0000 mg | Freq: Once | INTRAMUSCULAR | Status: AC
  Administered 2022-09-07: 25 mg via INTRAVENOUS
  Filled 2022-09-07: qty 1

## 2022-09-07 MED ORDER — SODIUM CHLORIDE 0.9 % IV SOLN
150.0000 mg | Freq: Once | INTRAVENOUS | Status: AC
  Administered 2022-09-07: 150 mg via INTRAVENOUS
  Filled 2022-09-07: qty 5

## 2022-09-07 MED ORDER — SODIUM CHLORIDE 0.9% FLUSH
10.0000 mL | INTRAVENOUS | Status: DC | PRN
  Administered 2022-09-07: 10 mL

## 2022-09-07 MED ORDER — SODIUM CHLORIDE 0.9 % IV SOLN
200.0000 mg | Freq: Once | INTRAVENOUS | Status: AC
  Administered 2022-09-07: 200 mg via INTRAVENOUS
  Filled 2022-09-07: qty 8

## 2022-09-07 MED ORDER — HEPARIN SOD (PORK) LOCK FLUSH 100 UNIT/ML IV SOLN
500.0000 [IU] | Freq: Once | INTRAVENOUS | Status: AC | PRN
  Administered 2022-09-07: 500 [IU]

## 2022-09-07 MED ORDER — SODIUM CHLORIDE 0.9 % IV SOLN
301.0000 mg | Freq: Once | INTRAVENOUS | Status: AC
  Administered 2022-09-07: 300 mg via INTRAVENOUS
  Filled 2022-09-07: qty 30

## 2022-09-07 MED ORDER — FAMOTIDINE IN NACL 20-0.9 MG/50ML-% IV SOLN
20.0000 mg | Freq: Once | INTRAVENOUS | Status: AC
  Administered 2022-09-07: 20 mg via INTRAVENOUS
  Filled 2022-09-07: qty 50

## 2022-09-07 MED ORDER — SODIUM CHLORIDE 0.9 % IV SOLN: Freq: Once | INTRAVENOUS | Status: AC

## 2022-09-07 MED ORDER — SODIUM CHLORIDE 0.9 % IV SOLN
125.0000 mg/m2 | Freq: Once | INTRAVENOUS | Status: AC
  Administered 2022-09-07: 246 mg via INTRAVENOUS
  Filled 2022-09-07: qty 41

## 2022-09-07 NOTE — Progress Notes (Signed)
Patient presents today for Keytruda/Taxol/Carboplatin infusions per providers order.  Vital signs within parameters for treatment.  Labs pending.  Labs within parameters for treatment.  Message received from Adonis Huguenin RN/Dr. Delton Coombes, patient okay for treatment.  Treatment given today per MD orders.  Stable during infusion without adverse affects.  Vital signs stable.  No complaints at this time.  Discharge from clinic ambulatory in stable condition.  Alert and oriented X 3.  Follow up with [facility] as scheduled.

## 2022-09-07 NOTE — Patient Instructions (Signed)
[facility]  Discharge Instructions: Thank you for choosing [facility] to provide your oncology and hematology care.  If you have a lab appointment with the [facility], please come in thru the Main Entrance and check in at the main information desk.  Wear comfortable clothing and clothing appropriate for easy access to any Portacath or PICC line.   We strive to give you quality time with your provider. You may need to reschedule your appointment if you arrive late (15 or more minutes).  Arriving late affects you and other patients whose appointments are after yours.  Also, if you miss three or more appointments without notifying the office, you may be dismissed from the clinic at the provider's discretion.      For prescription refill requests, have your pharmacy contact our office and allow 72 hours for refills to be completed.    Today you received the following chemotherapy and/or immunotherapy agents Keytruda/Taxol/Carboplatin      To help prevent nausea and vomiting after your treatment, we encourage you to take your nausea medication as directed.  BELOW ARE SYMPTOMS THAT SHOULD BE REPORTED IMMEDIATELY: *FEVER GREATER THAN 100.4 F (38 C) OR HIGHER *CHILLS OR SWEATING *NAUSEA AND VOMITING THAT IS NOT CONTROLLED WITH YOUR NAUSEA MEDICATION *UNUSUAL SHORTNESS OF BREATH *UNUSUAL BRUISING OR BLEEDING *URINARY PROBLEMS (pain or burning when urinating, or frequent urination) *BOWEL PROBLEMS (unusual diarrhea, constipation, pain near the anus) TENDERNESS IN MOUTH AND THROAT WITH OR WITHOUT PRESENCE OF ULCERS (sore throat, sores in mouth, or a toothache) UNUSUAL RASH, SWELLING OR PAIN  UNUSUAL VAGINAL DISCHARGE OR ITCHING   Items with * indicate a potential emergency and should be followed up as soon as possible or go to the Emergency Department if any problems should occur.  Please show the CHEMOTHERAPY ALERT CARD or IMMUNOTHERAPY ALERT CARD at  check-in to the Emergency Department and triage nurse.  Should you have questions after your visit or need to cancel or reschedule your appointment, please contact [facility] [phone]  and follow the prompts.  Office hours are 8:00 a.m. to 4:30 p.m. Monday - Friday. Please note that voicemails left after 4:00 p.m. may not be returned until the following business day.  We are closed weekends and major holidays. You have access to a nurse at all times for urgent questions. Please call the main number to the clinic [phone] and follow the prompts.  For any non-urgent questions, you may also contact your provider using MyChart. We now offer e-Visits for anyone 59 and older to request care online for non-urgent symptoms. For details visit [email or web address].   Also download the MyChart app! Go to the app store, search "MyChart", open the app, select [name], and log in with your MyChart username and password.  Masks are optional in the cancer centers. If you would like for your care team to wear a mask while they are taking care of you, please let them know. You may have one support person who is at least 86 years old accompany you for your appointments.

## 2022-09-07 NOTE — Progress Notes (Signed)
Patient has been assessed, vital signs and labs have been reviewed by Dr. Katragadda. ANC, Creatinine, LFTs, and Platelets are within treatment parameters per Dr. Katragadda. The patient is good to proceed with treatment at this time. Primary RN and pharmacy aware.  

## 2022-09-07 NOTE — Patient Instructions (Addendum)
[facility]  Discharge Instructions  You were seen and examined today by Dr. Delton Coombes.  Proceed with treatment as planned today. You will have a scan prior to your next treatment.  Please start taking Claritin. The aches and pains that you are getting could be related to the white blood cell booster shot. Start Claritin today and take it for 5 days.  Follow-up as scheduled.  Thank you for choosing [facility] to provide your oncology and hematology care.   To afford each patient quality time with our provider, please arrive at least 15 minutes before your scheduled appointment time. You may need to reschedule your appointment if you arrive late (10 or more minutes). Arriving late affects you and other patients whose appointments are after yours.  Also, if you miss three or more appointments without notifying the office, you may be dismissed from the clinic at the provider's discretion.    Again, thank you for choosing [facility].  Our hope is that these requests will decrease the amount of time that you wait before being seen by our physicians.   If you have a lab appointment with the [facility] please come in thru the Main Entrance and check in at the main information desk.           _____________________________________________________________  Should you have questions after your visit to [facility], please contact our office at [phone] and follow the prompts.  Our office hours are 8:00 a.m. to 4:30 p.m. Monday - Thursday and 8:00 a.m. to 2:30 p.m. Friday.  Please note that voicemails left after 4:00 p.m. may not be returned until the following business day.  We are closed weekends and all major holidays.  You do have access to a nurse 24-7, just call the main number to the clinic [phone] and do not press any options, hold on the line and a nurse will answer the phone.    For prescription  refill requests, have your pharmacy contact our office and allow 72 hours.    Masks are optional in the cancer centers. If you would like for your care team to wear a mask while they are taking care of you, please let them know. You may have one support person who is at least 86 years old accompany you for your appointments.

## 2022-09-08 LAB — T4: T4, Total: 7.1 ug/dL (ref 4.5–12.0)

## 2022-09-09 ENCOUNTER — Inpatient Hospital Stay: Payer: Medicare Other

## 2022-09-09 VITALS — BP 144/76 | HR 81 | Temp 97.6°F | Resp 18

## 2022-09-09 DIAGNOSIS — C771 Secondary and unspecified malignant neoplasm of intrathoracic lymph nodes: Secondary | ICD-10-CM | POA: Diagnosis not present

## 2022-09-09 DIAGNOSIS — C760 Malignant neoplasm of head, face and neck: Secondary | ICD-10-CM | POA: Diagnosis not present

## 2022-09-09 DIAGNOSIS — Z95828 Presence of other vascular implants and grafts: Secondary | ICD-10-CM

## 2022-09-09 DIAGNOSIS — R918 Other nonspecific abnormal finding of lung field: Secondary | ICD-10-CM | POA: Diagnosis not present

## 2022-09-09 DIAGNOSIS — G629 Polyneuropathy, unspecified: Secondary | ICD-10-CM | POA: Diagnosis not present

## 2022-09-09 DIAGNOSIS — Z5189 Encounter for other specified aftercare: Secondary | ICD-10-CM | POA: Diagnosis not present

## 2022-09-09 DIAGNOSIS — Z5111 Encounter for antineoplastic chemotherapy: Secondary | ICD-10-CM | POA: Diagnosis not present

## 2022-09-09 DIAGNOSIS — Z801 Family history of malignant neoplasm of trachea, bronchus and lung: Secondary | ICD-10-CM | POA: Diagnosis not present

## 2022-09-09 DIAGNOSIS — I10 Essential (primary) hypertension: Secondary | ICD-10-CM | POA: Diagnosis not present

## 2022-09-09 DIAGNOSIS — Z87891 Personal history of nicotine dependence: Secondary | ICD-10-CM | POA: Diagnosis not present

## 2022-09-09 DIAGNOSIS — Z5112 Encounter for antineoplastic immunotherapy: Secondary | ICD-10-CM | POA: Diagnosis not present

## 2022-09-09 MED ORDER — PEGFILGRASTIM-CBQV 6 MG/0.6ML ~~LOC~~ SOSY
6.0000 mg | PREFILLED_SYRINGE | Freq: Once | SUBCUTANEOUS | Status: AC
  Administered 2022-09-09: 6 mg via SUBCUTANEOUS
  Filled 2022-09-09: qty 0.6

## 2022-09-09 NOTE — Progress Notes (Signed)
Udenyca injection given per orders. Patient tolerated it well without problems. Vitals stable and discharged home from clinic ambulatory. Follow up as scheduled.

## 2022-09-09 NOTE — Patient Instructions (Signed)
[facility]  Discharge Instructions: Thank you for choosing [facility] to provide your oncology and hematology care.  If you have a lab appointment with the [facility], please come in thru the Main Entrance and check in at the main information desk.  Wear comfortable clothing and clothing appropriate for easy access to any Portacath or PICC line.   We strive to give you quality time with your provider. You may need to reschedule your appointment if you arrive late (15 or more minutes).  Arriving late affects you and other patients whose appointments are after yours.  Also, if you miss three or more appointments without notifying the office, you may be dismissed from the clinic at the provider's discretion.      For prescription refill requests, have your pharmacy contact our office and allow 72 hours for refills to be completed.    Today you received the following, udenyca injection    To help prevent nausea and vomiting after your treatment, we encourage you to take your nausea medication as directed.  BELOW ARE SYMPTOMS THAT SHOULD BE REPORTED IMMEDIATELY: *FEVER GREATER THAN 100.4 F (38 C) OR HIGHER *CHILLS OR SWEATING *NAUSEA AND VOMITING THAT IS NOT CONTROLLED WITH YOUR NAUSEA MEDICATION *UNUSUAL SHORTNESS OF BREATH *UNUSUAL BRUISING OR BLEEDING *URINARY PROBLEMS (pain or burning when urinating, or frequent urination) *BOWEL PROBLEMS (unusual diarrhea, constipation, pain near the anus) TENDERNESS IN MOUTH AND THROAT WITH OR WITHOUT PRESENCE OF ULCERS (sore throat, sores in mouth, or a toothache) UNUSUAL RASH, SWELLING OR PAIN  UNUSUAL VAGINAL DISCHARGE OR ITCHING   Items with * indicate a potential emergency and should be followed up as soon as possible or go to the Emergency Department if any problems should occur.  Please show the CHEMOTHERAPY ALERT CARD or IMMUNOTHERAPY ALERT CARD at check-in to the Emergency Department and triage  nurse.  Should you have questions after your visit or need to cancel or reschedule your appointment, please contact [facility] [phone]  and follow the prompts.  Office hours are 8:00 a.m. to 4:30 p.m. Monday - Friday. Please note that voicemails left after 4:00 p.m. may not be returned until the following business day.  We are closed weekends and major holidays. You have access to a nurse at all times for urgent questions. Please call the main number to the clinic [phone] and follow the prompts.  For any non-urgent questions, you may also contact your provider using MyChart. We now offer e-Visits for anyone 9 and older to request care online for non-urgent symptoms. For details visit [email or web address].   Also download the MyChart app! Go to the app store, search "MyChart", open the app, select [facility], and log in with your MyChart username and password.  Masks are optional in the cancer centers. If you would like for your care team to wear a mask while they are taking care of you, please let them know. You may have one support person who is at least 86 years old accompany you for your appointments.

## 2022-09-21 ENCOUNTER — Other Ambulatory Visit: Payer: Self-pay | Admitting: Cardiology

## 2022-09-22 ENCOUNTER — Encounter (HOSPITAL_COMMUNITY): Payer: Self-pay | Admitting: Radiology

## 2022-09-22 ENCOUNTER — Ambulatory Visit (HOSPITAL_COMMUNITY)
Admission: RE | Admit: 2022-09-22 | Discharge: 2022-09-22 | Disposition: A | Discharge status: Home / Self Care | Payer: Medicare Other | Source: Ambulatory Visit | Attending: Hematology | Admitting: Hematology

## 2022-09-22 DIAGNOSIS — C760 Malignant neoplasm of head, face and neck: Secondary | ICD-10-CM | POA: Insufficient documentation

## 2022-09-22 DIAGNOSIS — C7800 Secondary malignant neoplasm of unspecified lung: Secondary | ICD-10-CM | POA: Diagnosis not present

## 2022-09-22 DIAGNOSIS — R918 Other nonspecific abnormal finding of lung field: Secondary | ICD-10-CM | POA: Diagnosis not present

## 2022-09-22 DIAGNOSIS — C4442 Squamous cell carcinoma of skin of scalp and neck: Secondary | ICD-10-CM | POA: Diagnosis not present

## 2022-09-22 MED ORDER — IOHEXOL 300 MG/ML  SOLN
75.0000 mL | Freq: Once | INTRAMUSCULAR | Status: AC | PRN
  Administered 2022-09-22: 75 mL via INTRAVENOUS

## 2022-09-28 ENCOUNTER — Inpatient Hospital Stay: Payer: Medicare Other

## 2022-09-28 ENCOUNTER — Inpatient Hospital Stay: Payer: Medicare Other | Attending: Hematology | Admitting: Hematology

## 2022-09-28 VITALS — BP 140/88 | HR 83 | Temp 97.4°F | Resp 18 | Ht 66.0 in

## 2022-09-28 DIAGNOSIS — C760 Malignant neoplasm of head, face and neck: Secondary | ICD-10-CM

## 2022-09-28 DIAGNOSIS — Z5112 Encounter for antineoplastic immunotherapy: Secondary | ICD-10-CM | POA: Diagnosis not present

## 2022-09-28 DIAGNOSIS — C7801 Secondary malignant neoplasm of right lung: Secondary | ICD-10-CM | POA: Insufficient documentation

## 2022-09-28 DIAGNOSIS — Z95828 Presence of other vascular implants and grafts: Secondary | ICD-10-CM

## 2022-09-28 DIAGNOSIS — Z5111 Encounter for antineoplastic chemotherapy: Secondary | ICD-10-CM | POA: Diagnosis not present

## 2022-09-28 DIAGNOSIS — C779 Secondary and unspecified malignant neoplasm of lymph node, unspecified: Secondary | ICD-10-CM | POA: Diagnosis not present

## 2022-09-28 DIAGNOSIS — C7802 Secondary malignant neoplasm of left lung: Secondary | ICD-10-CM | POA: Diagnosis not present

## 2022-09-28 DIAGNOSIS — Z85828 Personal history of other malignant neoplasm of skin: Secondary | ICD-10-CM | POA: Diagnosis not present

## 2022-09-28 DIAGNOSIS — Z801 Family history of malignant neoplasm of trachea, bronchus and lung: Secondary | ICD-10-CM | POA: Insufficient documentation

## 2022-09-28 DIAGNOSIS — Z87891 Personal history of nicotine dependence: Secondary | ICD-10-CM | POA: Diagnosis not present

## 2022-09-28 DIAGNOSIS — I10 Essential (primary) hypertension: Secondary | ICD-10-CM | POA: Diagnosis not present

## 2022-09-28 LAB — CBC WITH DIFFERENTIAL/PLATELET
Abs Immature Granulocytes: 0.2 10*3/uL — ABNORMAL HIGH (ref 0.00–0.07)
Basophils Absolute: 0.1 10*3/uL (ref 0.0–0.1)
Basophils Relative: 1 %
Eosinophils Absolute: 0.2 10*3/uL (ref 0.0–0.5)
Eosinophils Relative: 2 %
HCT: 38.7 % — ABNORMAL LOW (ref 39.0–52.0)
Hemoglobin: 12.5 g/dL — ABNORMAL LOW (ref 13.0–17.0)
Immature Granulocytes: 1 %
Lymphocytes Relative: 26 %
Lymphs Abs: 3.9 10*3/uL (ref 0.7–4.0)
MCH: 34.1 pg — ABNORMAL HIGH (ref 26.0–34.0)
MCHC: 32.3 g/dL (ref 30.0–36.0)
MCV: 105.4 fL — ABNORMAL HIGH (ref 80.0–100.0)
Monocytes Absolute: 1.4 10*3/uL — ABNORMAL HIGH (ref 0.1–1.0)
Monocytes Relative: 9 %
Neutro Abs: 9.5 10*3/uL — ABNORMAL HIGH (ref 1.7–7.7)
Neutrophils Relative %: 61 %
Platelets: 348 10*3/uL (ref 150–400)
RBC: 3.67 MIL/uL — ABNORMAL LOW (ref 4.22–5.81)
RDW: 14.6 % (ref 11.5–15.5)
WBC: 15.4 10*3/uL — ABNORMAL HIGH (ref 4.0–10.5)
nRBC: 0 % (ref 0.0–0.2)

## 2022-09-28 LAB — COMPREHENSIVE METABOLIC PANEL
ALT: 15 U/L (ref 0–44)
AST: 17 U/L (ref 15–41)
Albumin: 3.5 g/dL (ref 3.5–5.0)
Alkaline Phosphatase: 82 U/L (ref 38–126)
Anion gap: 6 (ref 5–15)
BUN: 16 mg/dL (ref 8–23)
CO2: 27 mmol/L (ref 22–32)
Calcium: 8.8 mg/dL — ABNORMAL LOW (ref 8.9–10.3)
Chloride: 108 mmol/L (ref 98–111)
Creatinine, Ser: 0.93 mg/dL (ref 0.61–1.24)
GFR, Estimated: >60 mL/min (ref >60)
Glucose, Bld: 104 mg/dL — ABNORMAL HIGH (ref 70–99)
Potassium: 4.1 mmol/L (ref 3.5–5.1)
Sodium: 141 mmol/L (ref 135–145)
Total Bilirubin: 0.6 mg/dL (ref 0.3–1.2)
Total Protein: 6.4 g/dL — ABNORMAL LOW (ref 6.5–8.1)

## 2022-09-28 LAB — MAGNESIUM: Magnesium: 2.1 mg/dL (ref 1.7–2.4)

## 2022-09-28 MED ORDER — FAMOTIDINE IN NACL 20-0.9 MG/50ML-% IV SOLN
20.0000 mg | Freq: Once | INTRAVENOUS | Status: AC
  Administered 2022-09-28: 20 mg via INTRAVENOUS
  Filled 2022-09-28: qty 50

## 2022-09-28 MED ORDER — SODIUM CHLORIDE 0.9 % IV SOLN
200.0000 mg | Freq: Once | INTRAVENOUS | Status: AC
  Administered 2022-09-28: 200 mg via INTRAVENOUS
  Filled 2022-09-28: qty 8

## 2022-09-28 MED ORDER — SODIUM CHLORIDE 0.9% FLUSH
10.0000 mL | INTRAVENOUS | Status: DC | PRN
  Administered 2022-09-28: 10 mL

## 2022-09-28 MED ORDER — SODIUM CHLORIDE 0.9 % IV SOLN
301.0000 mg | Freq: Once | INTRAVENOUS | Status: AC
  Administered 2022-09-28: 300 mg via INTRAVENOUS
  Filled 2022-09-28: qty 30

## 2022-09-28 MED ORDER — PALONOSETRON HCL INJECTION 0.25 MG/5ML
0.2500 mg | Freq: Once | INTRAVENOUS | Status: AC
  Administered 2022-09-28: 0.25 mg via INTRAVENOUS
  Filled 2022-09-28: qty 5

## 2022-09-28 MED ORDER — SODIUM CHLORIDE 0.9 % IV SOLN: Freq: Once | INTRAVENOUS | Status: AC

## 2022-09-28 MED ORDER — DIPHENHYDRAMINE HCL 50 MG/ML IJ SOLN
50.0000 mg | Freq: Once | INTRAMUSCULAR | Status: AC
  Administered 2022-09-28: 50 mg via INTRAVENOUS
  Filled 2022-09-28: qty 1

## 2022-09-28 MED ORDER — SODIUM CHLORIDE 0.9% FLUSH
10.0000 mL | Freq: Once | INTRAVENOUS | Status: AC
  Administered 2022-09-28: 10 mL via INTRAVENOUS

## 2022-09-28 MED ORDER — SODIUM CHLORIDE 0.9 % IV SOLN
125.0000 mg/m2 | Freq: Once | INTRAVENOUS | Status: AC
  Administered 2022-09-28: 246 mg via INTRAVENOUS
  Filled 2022-09-28: qty 41

## 2022-09-28 MED ORDER — SODIUM CHLORIDE 0.9 % IV SOLN
150.0000 mg | Freq: Once | INTRAVENOUS | Status: AC
  Administered 2022-09-28: 150 mg via INTRAVENOUS
  Filled 2022-09-28: qty 150

## 2022-09-28 MED ORDER — HEPARIN SOD (PORK) LOCK FLUSH 100 UNIT/ML IV SOLN
500.0000 [IU] | Freq: Once | INTRAVENOUS | Status: AC | PRN
  Administered 2022-09-28: 500 [IU]

## 2022-09-28 MED ORDER — SODIUM CHLORIDE 0.9 % IV SOLN
10.0000 mg | Freq: Once | INTRAVENOUS | Status: AC
  Administered 2022-09-28: 10 mg via INTRAVENOUS
  Filled 2022-09-28: qty 10

## 2022-09-28 NOTE — Progress Notes (Signed)
Pt presents today for Keytruda, Taxol, and Carboplatin per provider's order. Vital signs and labs WNL for treatment.Okay to proceed with treatment today per Dr.K.

## 2022-09-28 NOTE — Progress Notes (Signed)
Patient has been examined by Dr. Katragadda, and vital signs and labs have been reviewed. ANC, Creatinine, LFTs, hemoglobin, and platelets are within treatment parameters per M.D. - pt may proceed with treatment.  Primary RN and pharmacy notified.  

## 2022-09-28 NOTE — Progress Notes (Signed)
Treatment given today per MD orders.  Stable during infusion without adverse affects.  Vital signs stable.  No complaints at this time.  Discharge from clinic ambulatory in stable condition.  Alert and oriented X 3.  Follow up with [facility] as scheduled.

## 2022-09-28 NOTE — Patient Instructions (Signed)
[facility]  Discharge Instructions: Thank you for choosing [facility] to provide your oncology and hematology care.  If you have a lab appointment with the [facility], please come in thru the Main Entrance and check in at the main information desk.  Wear comfortable clothing and clothing appropriate for easy access to any Portacath or PICC line.   We strive to give you quality time with your provider. You may need to reschedule your appointment if you arrive late (15 or more minutes).  Arriving late affects you and other patients whose appointments are after yours.  Also, if you miss three or more appointments without notifying the office, you may be dismissed from the clinic at the provider's discretion.      For prescription refill requests, have your pharmacy contact our office and allow 72 hours for refills to be completed.    Today you received the following chemotherapy and/or immunotherapy agents Keytruda/Taxol      To help prevent nausea and vomiting after your treatment, we encourage you to take your nausea medication as directed.  BELOW ARE SYMPTOMS THAT SHOULD BE REPORTED IMMEDIATELY: *FEVER GREATER THAN 100.4 F (38 C) OR HIGHER *CHILLS OR SWEATING *NAUSEA AND VOMITING THAT IS NOT CONTROLLED WITH YOUR NAUSEA MEDICATION *UNUSUAL SHORTNESS OF BREATH *UNUSUAL BRUISING OR BLEEDING *URINARY PROBLEMS (pain or burning when urinating, or frequent urination) *BOWEL PROBLEMS (unusual diarrhea, constipation, pain near the anus) TENDERNESS IN MOUTH AND THROAT WITH OR WITHOUT PRESENCE OF ULCERS (sore throat, sores in mouth, or a toothache) UNUSUAL RASH, SWELLING OR PAIN  UNUSUAL VAGINAL DISCHARGE OR ITCHING   Items with * indicate a potential emergency and should be followed up as soon as possible or go to the Emergency Department if any problems should occur.  Please show the CHEMOTHERAPY ALERT CARD or IMMUNOTHERAPY ALERT CARD at check-in to the  Emergency Department and triage nurse.  Should you have questions after your visit or need to cancel or reschedule your appointment, please contact [facility] [phone]  and follow the prompts.  Office hours are 8:00 a.m. to 4:30 p.m. Monday - Friday. Please note that voicemails left after 4:00 p.m. may not be returned until the following business day.  We are closed weekends and major holidays. You have access to a nurse at all times for urgent questions. Please call the main number to the clinic [phone] and follow the prompts.  For any non-urgent questions, you may also contact your provider using MyChart. We now offer e-Visits for anyone 9 and older to request care online for non-urgent symptoms. For details visit [email or web address].   Also download the MyChart app! Go to the app store, search "MyChart", open the app, select , and log in with your MyChart username and password.  Masks are optional in the cancer centers. If you would like for your care team to wear a mask while they are taking care of you, please let them know. You may have one support person who is at least 86 years old accompany you for your appointments.

## 2022-09-28 NOTE — Progress Notes (Signed)
 [facility] [address] 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  PRIOR THERAPY: None  NGS Results: Tp53 mutation positive, MSI-stable, TMB-low, PD-L1 (SP142): Negative  CURRENT THERAPY: Carboplatin, paclitaxel and pembrolizumab  BRIEF ONCOLOGIC HISTORY:  Oncology History  Squamous cell carcinoma of head and neck (HCC)  07/16/2022 Initial Diagnosis   Squamous cell carcinoma of head and neck (HCC)   07/23/2022 -  Chemotherapy   Patient is on Treatment Plan : Carboplatin  + Paclitaxel  + Pembrolizumab (200) D1 q21d       CANCER STAGING:  Cancer Staging  Squamous cell carcinoma of head and neck (HCC) Staging form: Cervical Lymph Nodes and Unknown Primary Tumors of the Head and Neck, AJCC 8th Edition - Clinical stage from 07/16/2022: Stage IVC (cT0, cN3b, cM1) - Unsigned    INTERVAL HISTORY:  [name] 88 y.o. male seen for follow-up of squamous cell carcinoma, unknown primary.  He has tolerated third cycle reasonably well.  He felt weak from day 4 today 7 which gradually improved.  He has fingertip numbness on and off which is stable.  He had this even prior to start of therapy.  He denied any GI side effects.    REVIEW OF SYSTEMS:  Review of Systems  Respiratory:  Positive for cough.   Neurological:  Positive for dizziness and numbness (On and off numbness in the fingertips).  All other systems reviewed and are negative.    PAST MEDICAL/SURGICAL HISTORY:  Past Medical History:  Diagnosis Date   Anemia    Atrial fibrillation (HCC)    Cataract    Cellulitis    In past   Complication of anesthesia    HARD TIME WAKING; CAUSES MY BP TO GO UP    Coronary artery disease    5 bypasses   DJD (degenerative joint disease)    Ejection fraction < 50%    Mildly reduced,  40% by echo   GERD (gastroesophageal reflux disease)    Hyperlipidemia    Hypertension    Persistent atrial fibrillation (HCC)    Prostate hypertrophy    on CT scan 09/2014   PUD (peptic ulcer disease)    RESOLVED    Skin cancer of eyelid    Resected   Past Surgical History:  Procedure Laterality Date   APPENDECTOMY     BYPASS GRAFT  2005   CATARACT EXTRACTION W/PHACO Left 07/22/2015   Procedure: CATARACT EXTRACTION PHACO AND INTRAOCULAR LENS PLACEMENT LEFT EYE CDE=8.14;  Surgeon: Kerry Hunt, MD;  Location: [facility];  Service: Ophthalmology;  Laterality: Left;   CATARACT EXTRACTION W/PHACO Right 08/18/2019   Procedure: CATARACT EXTRACTION PHACO AND INTRAOCULAR LENS PLACEMENT (IOC);  Surgeon: Wrzosek, James, MD;  Location: [facility];  Service: Ophthalmology;  Laterality: Right;  CDE: 9.74   CORONARY ARTERY BYPASS GRAFT  4/05   LIMA to LAD, SVG to PDA, SVG to ramus intermediate   EYE SURGERY  07/2015   EYELID CARCINOMA EXCISION     Skin cancer resected   Heart bypass  2005   LYMPH NODE BIOPSY Right 06/09/2022   Procedure: LYMPH NODE BIOPSY; supraclavicular;  Surgeon: Bridges, Lindsay C, MD;  Location: [facility];  Service: General;  Laterality: Right;   PORTACATH PLACEMENT Left 06/09/2022   Procedure: INSERTION PORT-A-CATH;  Surgeon: Bridges, Lindsay C, MD;    Location: [facility];  Service: General;  Laterality: Left;   TOTAL HIP ARTHROPLASTY     Right   TOTAL HIP ARTHROPLASTY Left 05/04/2018   Procedure: LEFT TOTAL HIP ARTHROPLASTY;  Surgeon: [name], MD;  Location: [facility];  Service: Orthopedics;  Laterality: Left;   TOTAL KNEE ARTHROPLASTY     Right     SOCIAL HISTORY:  Social History   Socioeconomic History   Marital status: Married    Spouse name: [name]   Number of children: 1   Years of education: 10   Highest education level: 10th grade  Occupational History   Occupation: retired  Tobacco Use   Smoking status: Former    Packs/day: 2.00    Years: 5.00    Total pack  years: 10.00    Types: Cigarettes    Quit date: 12/23/1963    Years since quitting: 58.8   Smokeless tobacco: Never  Vaping Use   Vaping Use: Never used  Substance and Sexual Activity   Alcohol use: No   Drug use: No   Sexual activity: Yes  Other Topics Concern   Not on file  Social History Narrative   Lives in a split level home.   Social Determinants of Health   Financial Resource Strain: Low Risk  (12/11/2021)   Overall Financial Resource Strain (CARDIA)    Difficulty of Paying Living Expenses: Not hard at all  Food Insecurity: No Food Insecurity (12/11/2021)   Hunger Vital Sign    Worried About Running Out of Food in the Last Year: Never true    Ran Out of Food in the Last Year: Never true  Transportation Needs: No Transportation Needs (12/11/2021)   PRAPARE - Hydrologist (Medical): No    Lack of Transportation (Non-Medical): No  Physical Activity: Insufficiently Active (12/11/2021)   Exercise Vital Sign    Days of Exercise per Week: 7 days    Minutes of Exercise per Session: 20 min  Stress: No Stress Concern Present (12/11/2021)   [place]    Feeling of Stress : Only a little  Recent Concern: Stress - Stress Concern Present (10/31/2021)   [place]    Feeling of Stress : To some extent  Social Connections: Moderately Isolated (12/11/2021)   Social Connection and Isolation Panel [NHANES]    Frequency of Communication with Friends and Family: More than three times a week    Frequency of Social Gatherings with Friends and Family: More than three times a week    Attends Religious Services: Never    Marine scientist or Organizations: No    Attends Archivist Meetings: Never    Marital Status: Married  Human resources officer Violence: Not At Risk (12/11/2021)   Humiliation, Afraid, Rape, and Kick questionnaire     Fear of Current or Ex-Partner: No    Emotionally Abused: No    Physically Abused: No    Sexually Abused: No    FAMILY HISTORY:  Family History  Problem Relation Age of Onset   Stroke Mother    Stroke Father    Hypertension Father    Early death Brother        63 months old   Cancer Brother        lung   Stroke Brother        heat    CURRENT MEDICATIONS:  Outpatient Encounter Medications as of  09/28/2022  Medication Sig   acetaminophen (TYLENOL) 500 MG tablet Take 1,000 mg by mouth every 6 (six) hours as needed for moderate pain.   CARBOPLATIN IV Inject into the vein every 21 ( twenty-one) days.   cetirizine-pseudoephedrine (ZYRTEC-D) 5-120 MG tablet Take once per day as need for congestion/runny nose   Cholecalciferol (VITAMIN D3) 5000 units CAPS Take 5,000 Units by mouth once a week.    diltiazem (CARDIZEM CD) 120 MG 24 hr capsule Take one capsule daily   ezetimibe (ZETIA) 10 MG tablet Take 1 tablet (10 mg total) by mouth daily.   finasteride (PROSCAR) 5 MG tablet TAKE ONE (1) TABLET EACH DAY   fluticasone (FLONASE) 50 MCG/ACT nasal spray Place 1 spray into both nostrils daily as needed for allergies or rhinitis.   furosemide (LASIX) 20 MG tablet Take 1 tablet (20 mg total) by mouth daily.   lidocaine-prilocaine (EMLA) cream Apply a small amount to port a cath site and cover with plastic wrap 1 hour prior to chemotherapy appointments (Patient not taking: Reported on 09/07/2022)   lovastatin (MEVACOR) 20 MG tablet TAKE 1/2 TABLET EVERY OTHER DAY   metoprolol tartrate (LOPRESSOR) 100 MG tablet TAKE ONE (1) TABLET BY MOUTH TWO (2) TIMES DAILY   montelukast (SINGULAIR) 10 MG tablet TAKE ONE TABLET BY MOUTH AT BEDTIME   PACLITAXEL IV Inject into the vein every 21 ( twenty-one) days.   PEMBROLIZUMAB IV Inject into the vein every 21 ( twenty-one) days.   prochlorperazine (COMPAZINE) 10 MG tablet Take 1 tablet (10 mg total) by mouth every 6 (six) hours as needed for nausea or vomiting.  (Patient not taking: Reported on 09/07/2022)   tobramycin (TOBREX) 0.3 % ophthalmic solution    warfarin (COUMADIN) 5 MG tablet TAKE 1 TABLET ON SAT, SUN, TUES, & THURSTAKE 1/2 TABLET ON MON, WED, & FRIDAY   [EXPIRED] sodium chloride flush (NS) 0.9 % injection 10 mL    No facility-administered encounter medications on file as of 09/28/2022.    ALLERGIES:  Allergies  Allergen Reactions   Penicillins Hives and Rash    Has patient had a PCN reaction causing immediate rash, facial/tongue/throat swelling, SOB or lightheadedness with hypotension: Yes Has patient had a PCN reaction causing severe rash involving mucus membranes or skin necrosis: Yes Has patient had a PCN reaction that required hospitalization: No Has patient had a PCN reaction occurring within the last 10 years: No If all of the above answers are "NO", then may proceed with Cephalosporin use.    Hct [Hydrochlorothiazide] Other (See Comments)    hyper   Statins Other (See Comments)    Myalgia, tolerates low dosages of lovastatin      PHYSICAL EXAM:  ECOG Performance status: 1  There were no vitals filed for this visit.  There were no vitals filed for this visit.  Physical Exam Vitals reviewed.  Constitutional:      Appearance: Normal appearance.  Cardiovascular:     Rate and Rhythm: Regular rhythm.     Heart sounds: Normal heart sounds.  Pulmonary:     Breath sounds: Normal breath sounds.  Neurological:     Mental Status: He is alert.  Psychiatric:        Mood and Affect: Mood normal.        Behavior: Behavior normal.   Neck and right axillary lymph nodes palpable.  LABORATORY DATA:  I have reviewed the labs as listed.  CBC    Component Value Date/Time   WBC 11.6 (H)   09/07/2022 0806   RBC 3.46 (L) 09/07/2022 0806   HGB 11.8 (L) 09/07/2022 0806   HGB 13.3 05/22/2022 0955   HCT 36.4 (L) 09/07/2022 0806   HCT 39.4 05/22/2022 0955   PLT 295 09/07/2022 0806   PLT 372 05/22/2022 0955   MCV 105.2 (H)  09/07/2022 0806   MCV 98 (H) 05/22/2022 0955   MCH 34.1 (H) 09/07/2022 0806   MCHC 32.4 09/07/2022 0806   RDW 14.4 09/07/2022 0806   RDW 11.6 05/22/2022 0955   LYMPHSABS 3.3 09/07/2022 0806   LYMPHSABS 5.1 (H) 05/22/2022 0955   MONOABS 0.9 09/07/2022 0806   EOSABS 0.1 09/07/2022 0806   EOSABS 0.3 05/22/2022 0955   BASOSABS 0.1 09/07/2022 0806   BASOSABS 0.1 05/22/2022 0955      Latest Ref Rng & Units 09/07/2022    8:06 AM 08/13/2022    8:17 AM 07/30/2022   11:34 AM  CMP  Glucose 70 - 99 mg/dL 144  105  161   BUN 8 - 23 mg/dL 19  12  19   Creatinine 0.61 - 1.24 mg/dL 0.99  0.88  1.02   Sodium 135 - 145 mmol/L 138  138  138   Potassium 3.5 - 5.1 mmol/L 3.8  4.0  3.7   Chloride 98 - 111 mmol/L 107  104  104   CO2 22 - 32 mmol/L 26  27  27   Calcium 8.9 - 10.3 mg/dL 8.7  8.7  8.7   Total Protein 6.5 - 8.1 g/dL 6.2  6.8  6.2   Total Bilirubin 0.3 - 1.2 mg/dL 0.5  0.8  0.6   Alkaline Phos 38 - 126 U/L 65  72  84   AST 15 - 41 U/L 16  14  28   ALT 0 - 44 U/L 13  13  23     DIAGNOSTIC IMAGING:  I have independently reviewed the scans and discussed with the patient.  ASSESSMENT: 1.  Right supraclavicular lymphadenopathy and multiple lung nodules: - Patient noticed right neck mass since Christmas 2022.  10 to 15 pound weight loss since January 2023.  Denies any dysphagia or odynophagia. - CT neck (05/27/2022): Numerous lymph nodes in the right neck, right supraclavicular lymph node mass measures 5.7 x 3.7 cm.  18 mm posterior lymph node in the right lower neck.  Numerous lymph nodes in the right lower and posterior neck approximately 1 cm.  Many have internal necrosis consistent with metastatic disease.  20 mm lymph node just above the right clavicle.  Left level 2 node 12 mm. - CT CAP (05/28/2022): Several bilateral lung nodules, RUL nodule measuring 1.8 x 1.3 cm.  Small clusters of nodules in the medial left upper lobe.  Irregular nodular density along the left side of the mediastinum  measuring 2.1 cm.  No metastatic disease in the abdomen or pelvis.  Liver is slightly nodular contour. - Lab work shows elevated white count since 2009, predominantly lymphocytosis. - Pathology (06/09/2022): Metastatic carcinoma positive for p40, p63 and CK5/6-patchy positivity with CK7.  Negative for TTF-1, Napsin A, PAX8, prostein, PSA, CK20 and CDX2.  - PET scan (06/11/2022): Bulky right supraclavicular nodal mass, smaller hypermetabolic right posterior triangle and jugular lymph nodes and single hypermetabolic left level 3 lymph node.  Bilateral hypermetabolic pulmonary nodules and metastatic pattern.  Minimal hypermetabolic mediastinal nodal metastasis.  Cluster of hypermetabolic right axillary lymph nodes.  No evidence of metastatic disease or primary lesion in the abdomen or pelvis.    No bone lesions. - MRI of the brain: Chronic small vessel ischemic changes with no evidence of metastatic disease. - NGS: T p53 pathogenic variant, MSI-stable, TMB-low, LOH-low, PD-L1 (SP142): Negative, p16 and p18 negative - Cancer type ID: 96% probability squamous cell carcinoma, subtype head and neck/skin.  Cancer types ruled out with 95% confidence includes skin basal cell carcinoma. - Cycle 1 of carboplatin, Taxol and pembrolizumab on 07/23/2022  2.  Social/family history: - He lives at home with his wife.  Independent of ADLs and IADLs.  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  May have had asbestos exposure 30 years ago.  Quit smoking cigarettes in 1965.  Smoked 1 pack/day for less than 12 years.  2 of his brothers had lung cancer and both were smokers.   PLAN:  1.  Metastatic squamous cell carcinoma, presumed head and neck primary: - He has tolerated cycle 3 very well. - We reviewed CT soft tissue neck and CT chest from 09/22/2022.  Interval improvement in cervical adenopathy with no new lymph nodes.  Lung lesions have also improved. - Reviewed labs today  which showed normal LFTs and creatinine.  CBC was grossly normal. - Proceed with cycle 4 today with same doses.  I will consider extending his chemotherapy to 6 cycles followed by Keytruda maintenance.  RTC 3 weeks for follow-up.  2.  Neuropathy: - On and off numbness in the fingertips is stable.  Will closely monitor.  Orders placed this encounter:  No orders of the defined types were placed in this encounter.     Sreedhar Katragadda, MD [facility] [phone]   

## 2022-09-28 NOTE — Patient Instructions (Signed)
[place] at [facility] Discharge Instructions   You were seen and examined today by Dr. Delton Coombes.  He reviewed the results of your lab work which are normal/stable.   He reviewed the results of your scan which shows the cancer is shrinking and is responding well to treatment.   We will proceed with your treatment today.   Return as scheduled.    Thank you for choosing [place] at [facility] to provide your oncology and hematology care.  To afford each patient quality time with our provider, please arrive at least 15 minutes before your scheduled appointment time.   If you have a lab appointment with the [facility] please come in thru the Main Entrance and check in at the main information desk.  You need to re-schedule your appointment should you arrive 10 or more minutes late.  We strive to give you quality time with our providers, and arriving late affects you and other patients whose appointments are after yours.  Also, if you no show three or more times for appointments you may be dismissed from the clinic at the providers discretion.     Again, thank you for choosing [facility].  Our hope is that these requests will decrease the amount of time that you wait before being seen by our physicians.       _____________________________________________________________  Should you have questions after your visit to [facility], please contact our office at [phone] and follow the prompts.  Our office hours are 8:00 a.m. and 4:30 p.m. Monday - Friday.  Please note that voicemails left after 4:00 p.m. may not be returned until the following business day.  We are closed weekends and major holidays.  You do have access to a nurse 24-7, just call the main number to the clinic [phone] and do not press any options, hold on the line and a nurse will answer the phone.    For prescription refill requests, have  your pharmacy contact our office and allow 72 hours.    Due to Covid, you will need to wear a mask upon entering the hospital. If you do not have a mask, a mask will be given to you at the Main Entrance upon arrival. For doctor visits, patients may have 1 support person age 86 or older with them. For treatment visits, patients can not have anyone with them due to social distancing guidelines and our immunocompromised population.

## 2022-09-30 ENCOUNTER — Inpatient Hospital Stay: Payer: Medicare Other

## 2022-09-30 ENCOUNTER — Ambulatory Visit: Payer: Medicare Other | Admitting: Family Medicine

## 2022-09-30 VITALS — BP 137/69 | HR 88 | Temp 97.0°F | Resp 18

## 2022-09-30 DIAGNOSIS — C7802 Secondary malignant neoplasm of left lung: Secondary | ICD-10-CM | POA: Diagnosis not present

## 2022-09-30 DIAGNOSIS — C760 Malignant neoplasm of head, face and neck: Secondary | ICD-10-CM

## 2022-09-30 DIAGNOSIS — Z801 Family history of malignant neoplasm of trachea, bronchus and lung: Secondary | ICD-10-CM | POA: Diagnosis not present

## 2022-09-30 DIAGNOSIS — C779 Secondary and unspecified malignant neoplasm of lymph node, unspecified: Secondary | ICD-10-CM | POA: Diagnosis not present

## 2022-09-30 DIAGNOSIS — Z85828 Personal history of other malignant neoplasm of skin: Secondary | ICD-10-CM | POA: Diagnosis not present

## 2022-09-30 DIAGNOSIS — C7801 Secondary malignant neoplasm of right lung: Secondary | ICD-10-CM | POA: Diagnosis not present

## 2022-09-30 DIAGNOSIS — Z95828 Presence of other vascular implants and grafts: Secondary | ICD-10-CM

## 2022-09-30 DIAGNOSIS — Z87891 Personal history of nicotine dependence: Secondary | ICD-10-CM | POA: Diagnosis not present

## 2022-09-30 DIAGNOSIS — I10 Essential (primary) hypertension: Secondary | ICD-10-CM | POA: Diagnosis not present

## 2022-09-30 DIAGNOSIS — Z5112 Encounter for antineoplastic immunotherapy: Secondary | ICD-10-CM | POA: Diagnosis not present

## 2022-09-30 DIAGNOSIS — Z5111 Encounter for antineoplastic chemotherapy: Secondary | ICD-10-CM | POA: Diagnosis not present

## 2022-09-30 MED ORDER — PEGFILGRASTIM-CBQV 6 MG/0.6ML ~~LOC~~ SOSY
6.0000 mg | PREFILLED_SYRINGE | Freq: Once | SUBCUTANEOUS | Status: AC
  Administered 2022-09-30: 6 mg via SUBCUTANEOUS
  Filled 2022-09-30: qty 0.6

## 2022-09-30 NOTE — Patient Instructions (Signed)
[facility]  Discharge Instructions: Thank you for choosing [facility] to provide your oncology and hematology care.  If you have a lab appointment with the Cancer Center, please come in thru the Main Entrance and check in at the main information desk.  Wear comfortable clothing and clothing appropriate for easy access to any Portacath or PICC line.   We strive to give you quality time with your provider. You may need to reschedule your appointment if you arrive late (15 or more minutes).  Arriving late affects you and other patients whose appointments are after yours.  Also, if you miss three or more appointments without notifying the office, you may be dismissed from the clinic at the provider's discretion.      For prescription refill requests, have your pharmacy contact our office and allow 72 hours for refills to be completed.    Today you received the following chemotherapy and/or immunotherapy agents Udenyca      To help prevent nausea and vomiting after your treatment, we encourage you to take your nausea medication as directed.  BELOW ARE SYMPTOMS THAT SHOULD BE REPORTED IMMEDIATELY: *FEVER GREATER THAN 100.4 F (38 C) OR HIGHER *CHILLS OR SWEATING *NAUSEA AND VOMITING THAT IS NOT CONTROLLED WITH YOUR NAUSEA MEDICATION *UNUSUAL SHORTNESS OF BREATH *UNUSUAL BRUISING OR BLEEDING *URINARY PROBLEMS (pain or burning when urinating, or frequent urination) *BOWEL PROBLEMS (unusual diarrhea, constipation, pain near the anus) TENDERNESS IN MOUTH AND THROAT WITH OR WITHOUT PRESENCE OF ULCERS (sore throat, sores in mouth, or a toothache) UNUSUAL RASH, SWELLING OR PAIN  UNUSUAL VAGINAL DISCHARGE OR ITCHING   Items with * indicate a potential emergency and should be followed up as soon as possible or go to the Emergency Department if any problems should occur.  Please show the CHEMOTHERAPY ALERT CARD or IMMUNOTHERAPY ALERT CARD at check-in to the Emergency  Department and triage nurse.  Should you have questions after your visit or need to cancel or reschedule your appointment, please contact [facility] [phone]  and follow the prompts.  Office hours are 8:00 a.m. to 4:30 p.m. Monday - Friday. Please note that voicemails left after 4:00 p.m. may not be returned until the following business day.  We are closed weekends and major holidays. You have access to a nurse at all times for urgent questions. Please call the main number to the clinic [phone] and follow the prompts.  For any non-urgent questions, you may also contact your provider using MyChart. We now offer e-Visits for anyone 18 and older to request care online for non-urgent symptoms. For details visit [email or web address].   Also download the MyChart app! Go to the app store, search "MyChart", open the app, select [facility] [place], and log in with your MyChart username and password.  Masks are optional in the cancer centers. If you would like for your care team to wear a mask while they are taking care of you, please let them know. You may have one support person who is at least 86 years old accompany you for your appointments.  

## 2022-09-30 NOTE — Progress Notes (Signed)
[name] presents today for Udenyca injection per the provider's orders.  Stable during administration without incident; injection site WNL; see MAR for injection details.  Patient tolerated procedure well and without incident.  No questions or complaints noted at this time.

## 2022-10-15 NOTE — Progress Notes (Signed)
Discontinue diphenhydramine from oncology treatment plan --> Add Quzyttir (cetirizine) 10 mg IVPush x 1 as premedication for oncology treatment plan.  T.O. Dr Rhys Martini, PharmD 10/15/21 @ 1145

## 2022-10-16 ENCOUNTER — Ambulatory Visit (INDEPENDENT_AMBULATORY_CARE_PROVIDER_SITE_OTHER): Payer: Medicare Other | Admitting: Family Medicine

## 2022-10-16 ENCOUNTER — Encounter: Payer: Self-pay | Admitting: Family Medicine

## 2022-10-16 VITALS — BP 147/82 | HR 85 | Temp 97.3°F | Ht 66.0 in | Wt 188.0 lb

## 2022-10-16 DIAGNOSIS — Z7901 Long term (current) use of anticoagulants: Secondary | ICD-10-CM

## 2022-10-16 DIAGNOSIS — I4819 Other persistent atrial fibrillation: Secondary | ICD-10-CM

## 2022-10-16 LAB — COAGUCHEK XS/INR WAIVED
INR: 2.2 — ABNORMAL HIGH (ref 0.9–1.1)
Prothrombin Time: 26.1 s

## 2022-10-16 NOTE — Progress Notes (Signed)
BP (!) 147/82   Pulse 85   Temp (!) 97.3 F (36.3 C)   Ht 5\' 6"  (1.676 m)   Wt 188 lb (85.3 kg)   SpO2 97%   BMI 30.34 kg/m    Subjective:   Patient ID: [name], male    DOB: 1931/12/22, 89 y.o.   MRN: [id number]  HPI: [name] is a 89 y.o. male presenting on 10/16/2022 for Hyperlipidemia and Atrial Fibrillation   HPI Coumadin recheck Target goal: 2.0-3.0 Reason on anticoagulation: A-fib Patient denies any bruising or bleeding or chest pain or palpitations   Relevant past medical, surgical, family and social history reviewed and updated as indicated. Interim medical history since our last visit reviewed. Allergies and medications reviewed and updated.  Review of Systems  Constitutional:  Negative for chills and fever.  Respiratory:  Negative for shortness of breath and wheezing.   Cardiovascular:  Negative for chest pain and leg swelling.  Gastrointestinal:  Negative for blood in stool.  Genitourinary:  Negative for hematuria.  Musculoskeletal:  Negative for gait problem.  All other systems reviewed and are negative.   Per HPI unless specifically indicated above   Allergies as of 10/16/2022       Reactions   Penicillins Hives, Rash   Has patient had a PCN reaction causing immediate rash, facial/tongue/throat swelling, SOB or lightheadedness with hypotension: Yes Has patient had a PCN reaction causing severe rash involving mucus membranes or skin necrosis: Yes Has patient had a PCN reaction that required hospitalization: No Has patient had a PCN reaction occurring within the last 10 years: No If all of the above answers are "NO", then may proceed with Cephalosporin use.   Hct [hydrochlorothiazide] Other (See Comments)   hyper   Statins Other (See Comments)   Myalgia, tolerates low dosages of lovastatin         Medication List        Accurate as of October 16, 2022  9:04 AM. If you have any questions, ask your nurse or doctor.           acetaminophen 500 MG tablet Commonly known as: TYLENOL Take 1,000 mg by mouth every 6 (six) hours as needed for moderate pain.   CARBOPLATIN IV Inject into the vein every 21 ( twenty-one) days.   cetirizine-pseudoephedrine 5-120 MG tablet Commonly known as: ZYRTEC-D Take once per day as need for congestion/runny nose   diltiazem 120 MG 24 hr capsule Commonly known as: CARDIZEM CD Take one capsule daily   ezetimibe 10 MG tablet Commonly known as: ZETIA Take 1 tablet (10 mg total) by mouth daily.   finasteride 5 MG tablet Commonly known as: PROSCAR TAKE ONE (1) TABLET EACH DAY   fluticasone 50 MCG/ACT nasal spray Commonly known as: FLONASE Place 1 spray into both nostrils daily as needed for allergies or rhinitis.   furosemide 20 MG tablet Commonly known as: LASIX Take 1 tablet (20 mg total) by mouth daily.   lidocaine-prilocaine cream Commonly known as: EMLA Apply a small amount to port a cath site and cover with plastic wrap 1 hour prior to chemotherapy appointments   lovastatin 20 MG tablet Commonly known as: MEVACOR TAKE 1/2 TABLET EVERY OTHER DAY   metoprolol tartrate 100 MG tablet Commonly known as: LOPRESSOR TAKE ONE (1) TABLET BY MOUTH TWO (2) TIMES DAILY   montelukast 10 MG tablet Commonly known as: SINGULAIR TAKE ONE TABLET BY MOUTH AT BEDTIME   PACLITAXEL IV Inject into the  vein every 21 ( twenty-one) days.   PEMBROLIZUMAB IV Inject into the vein every 21 ( twenty-one) days.   prochlorperazine 10 MG tablet Commonly known as: COMPAZINE Take 1 tablet (10 mg total) by mouth every 6 (six) hours as needed for nausea or vomiting.   tobramycin 0.3 % ophthalmic solution Commonly known as: TOBREX   Vitamin D3 125 MCG (5000 UT) Caps Take 5,000 Units by mouth once a week.   warfarin 5 MG tablet Commonly known as: COUMADIN Take as directed by the anticoagulation clinic. If you are unsure how to take this medication, talk to your nurse or  doctor. Original instructions: TAKE 1 TABLET ON SAT, SUN, TUES, & THURSTAKE 1/2 TABLET ON MON, WED, & FRIDAY         Objective:   BP (!) 147/82   Pulse 85   Temp (!) 97.3 F (36.3 C)   Ht 5\' 6"  (1.676 m)   Wt 188 lb (85.3 kg)   SpO2 97%   BMI 30.34 kg/m   Wt Readings from Last 3 Encounters:  10/16/22 188 lb (85.3 kg)  09/28/22 186 lb 9.6 oz (84.6 kg)  09/07/22 186 lb 12.8 oz (84.7 kg)    Physical Exam Vitals and nursing note reviewed.  Constitutional:      General: He is not in acute distress.    Appearance: He is well-developed. He is not diaphoretic.  Eyes:     General: No scleral icterus.    Conjunctiva/sclera: Conjunctivae normal.  Neck:     Thyroid: No thyromegaly.  Skin:    General: Skin is warm and dry.     Findings: No rash.  Neurological:     Mental Status: He is alert and oriented to person, place, and time.     Coordination: Coordination normal.  Psychiatric:        Behavior: Behavior normal.       Assessment & Plan:   Problem List Items Addressed This Visit       Cardiovascular and Mediastinum   Atrial fibrillation, persistent ([place]) - Primary   Relevant Orders   CoaguChek XS/INR Waived     Other   Long term current use of anticoagulant therapy    Description   Continue weekly dose by taking 1 tablet every day except for Thursdays and Saturdays take 1/2 tablet  INR today is  2.2 (goal is 2-3)    Recheck in 4-6 weeks    [facility] modified Follow up plan: Return if symptoms worsen or fail to improve, for 4 to 6-week INR.  Counseling provided for all of the vaccine components Orders Placed This Encounter  Procedures   CoaguChek XS/INR Cumby Holy [place], MD [facility] 10/16/2022, 9:04 AM

## 2022-10-19 ENCOUNTER — Inpatient Hospital Stay: Payer: Medicare Other

## 2022-10-19 ENCOUNTER — Inpatient Hospital Stay: Payer: Medicare Other | Attending: Hematology | Admitting: Hematology

## 2022-10-19 VITALS — BP 143/91 | Temp 96.2°F | Resp 18

## 2022-10-19 DIAGNOSIS — C760 Malignant neoplasm of head, face and neck: Secondary | ICD-10-CM

## 2022-10-19 DIAGNOSIS — Z5112 Encounter for antineoplastic immunotherapy: Secondary | ICD-10-CM | POA: Insufficient documentation

## 2022-10-19 DIAGNOSIS — Z5189 Encounter for other specified aftercare: Secondary | ICD-10-CM | POA: Diagnosis not present

## 2022-10-19 DIAGNOSIS — C771 Secondary and unspecified malignant neoplasm of intrathoracic lymph nodes: Secondary | ICD-10-CM | POA: Insufficient documentation

## 2022-10-19 DIAGNOSIS — I10 Essential (primary) hypertension: Secondary | ICD-10-CM | POA: Diagnosis not present

## 2022-10-19 DIAGNOSIS — G629 Polyneuropathy, unspecified: Secondary | ICD-10-CM | POA: Diagnosis not present

## 2022-10-19 DIAGNOSIS — Z95828 Presence of other vascular implants and grafts: Secondary | ICD-10-CM

## 2022-10-19 DIAGNOSIS — R918 Other nonspecific abnormal finding of lung field: Secondary | ICD-10-CM | POA: Insufficient documentation

## 2022-10-19 DIAGNOSIS — Z79899 Other long term (current) drug therapy: Secondary | ICD-10-CM | POA: Diagnosis not present

## 2022-10-19 DIAGNOSIS — Z87891 Personal history of nicotine dependence: Secondary | ICD-10-CM | POA: Diagnosis not present

## 2022-10-19 DIAGNOSIS — Z5111 Encounter for antineoplastic chemotherapy: Secondary | ICD-10-CM | POA: Diagnosis not present

## 2022-10-19 DIAGNOSIS — Z801 Family history of malignant neoplasm of trachea, bronchus and lung: Secondary | ICD-10-CM | POA: Insufficient documentation

## 2022-10-19 LAB — COMPREHENSIVE METABOLIC PANEL
ALT: 14 U/L (ref 0–44)
AST: 16 U/L (ref 15–41)
Albumin: 3.6 g/dL (ref 3.5–5.0)
Alkaline Phosphatase: 79 U/L (ref 38–126)
Anion gap: 9 (ref 5–15)
BUN: 20 mg/dL (ref 8–23)
CO2: 24 mmol/L (ref 22–32)
Calcium: 8.8 mg/dL — ABNORMAL LOW (ref 8.9–10.3)
Chloride: 104 mmol/L (ref 98–111)
Creatinine, Ser: 0.96 mg/dL (ref 0.61–1.24)
GFR, Estimated: >60 mL/min (ref >60)
Glucose, Bld: 130 mg/dL — ABNORMAL HIGH (ref 70–99)
Potassium: 3.9 mmol/L (ref 3.5–5.1)
Sodium: 137 mmol/L (ref 135–145)
Total Bilirubin: 0.6 mg/dL (ref 0.3–1.2)
Total Protein: 6.5 g/dL (ref 6.5–8.1)

## 2022-10-19 LAB — CBC WITH DIFFERENTIAL/PLATELET
Abs Immature Granulocytes: 0.1 10*3/uL — ABNORMAL HIGH (ref 0.00–0.07)
Basophils Absolute: 0.1 10*3/uL (ref 0.0–0.1)
Basophils Relative: 1 %
Eosinophils Absolute: 0.3 10*3/uL (ref 0.0–0.5)
Eosinophils Relative: 2 %
HCT: 37 % — ABNORMAL LOW (ref 39.0–52.0)
Hemoglobin: 11.9 g/dL — ABNORMAL LOW (ref 13.0–17.0)
Immature Granulocytes: 1 %
Lymphocytes Relative: 26 %
Lymphs Abs: 3.5 10*3/uL (ref 0.7–4.0)
MCH: 34.6 pg — ABNORMAL HIGH (ref 26.0–34.0)
MCHC: 32.2 g/dL (ref 30.0–36.0)
MCV: 107.6 fL — ABNORMAL HIGH (ref 80.0–100.0)
Monocytes Absolute: 1.2 10*3/uL — ABNORMAL HIGH (ref 0.1–1.0)
Monocytes Relative: 9 %
Neutro Abs: 8.3 10*3/uL — ABNORMAL HIGH (ref 1.7–7.7)
Neutrophils Relative %: 61 %
Platelets: 281 10*3/uL (ref 150–400)
RBC: 3.44 MIL/uL — ABNORMAL LOW (ref 4.22–5.81)
RDW: 15 % (ref 11.5–15.5)
WBC: 13.5 10*3/uL — ABNORMAL HIGH (ref 4.0–10.5)
nRBC: 0 % (ref 0.0–0.2)

## 2022-10-19 LAB — MAGNESIUM: Magnesium: 2.2 mg/dL (ref 1.7–2.4)

## 2022-10-19 MED ORDER — SODIUM CHLORIDE 0.9 % IV SOLN
150.0000 mg | Freq: Once | INTRAVENOUS | Status: AC
  Administered 2022-10-19: 150 mg via INTRAVENOUS
  Filled 2022-10-19: qty 150

## 2022-10-19 MED ORDER — CETIRIZINE HCL 10 MG/ML IV SOLN
10.0000 mg | Freq: Once | INTRAVENOUS | Status: AC
  Administered 2022-10-19: 10 mg via INTRAVENOUS
  Filled 2022-10-19: qty 1

## 2022-10-19 MED ORDER — SODIUM CHLORIDE 0.9 % IV SOLN
10.0000 mg | Freq: Once | INTRAVENOUS | Status: AC
  Administered 2022-10-19: 10 mg via INTRAVENOUS
  Filled 2022-10-19: qty 10

## 2022-10-19 MED ORDER — PALONOSETRON HCL INJECTION 0.25 MG/5ML
0.2500 mg | Freq: Once | INTRAVENOUS | Status: AC
  Administered 2022-10-19: 0.25 mg via INTRAVENOUS
  Filled 2022-10-19: qty 5

## 2022-10-19 MED ORDER — SODIUM CHLORIDE 0.9% FLUSH
10.0000 mL | INTRAVENOUS | Status: DC | PRN
  Administered 2022-10-19: 10 mL

## 2022-10-19 MED ORDER — SODIUM CHLORIDE 0.9 % IV SOLN
200.0000 mg | Freq: Once | INTRAVENOUS | Status: AC
  Administered 2022-10-19: 200 mg via INTRAVENOUS
  Filled 2022-10-19: qty 8

## 2022-10-19 MED ORDER — FAMOTIDINE IN NACL 20-0.9 MG/50ML-% IV SOLN
20.0000 mg | Freq: Once | INTRAVENOUS | Status: AC
  Administered 2022-10-19: 20 mg via INTRAVENOUS
  Filled 2022-10-19: qty 50

## 2022-10-19 MED ORDER — SODIUM CHLORIDE 0.9 % IV SOLN
301.0000 mg | Freq: Once | INTRAVENOUS | Status: AC
  Administered 2022-10-19: 300 mg via INTRAVENOUS
  Filled 2022-10-19: qty 30

## 2022-10-19 MED ORDER — SODIUM CHLORIDE 0.9% FLUSH
10.0000 mL | Freq: Once | INTRAVENOUS | Status: AC
  Administered 2022-10-19: 10 mL via INTRAVENOUS

## 2022-10-19 MED ORDER — HEPARIN SOD (PORK) LOCK FLUSH 100 UNIT/ML IV SOLN
500.0000 [IU] | Freq: Once | INTRAVENOUS | Status: AC | PRN
  Administered 2022-10-19: 500 [IU]

## 2022-10-19 MED ORDER — SODIUM CHLORIDE 0.9 % IV SOLN
80.0000 mg/m2 | Freq: Once | INTRAVENOUS | Status: AC
  Administered 2022-10-19: 156 mg via INTRAVENOUS
  Filled 2022-10-19: qty 26

## 2022-10-19 MED ORDER — SODIUM CHLORIDE 0.9 % IV SOLN: Freq: Once | INTRAVENOUS | Status: AC

## 2022-10-19 NOTE — Progress Notes (Signed)
Patient presents today for Keytruda/Taxol/Carboplatin infusions per providers order.  Vital signs within parameters for treatment.  Labs pending.  Patient has no new complaints at this time.  Labs within parameters for treatment.  Treatment given today per MD orders.  Stable during infusion without adverse affects.  Vital signs stable.  No complaints at this time.  Discharge from clinic ambulatory in stable condition.  Alert and oriented X 3.  Follow up with [facility] as scheduled.

## 2022-10-19 NOTE — Progress Notes (Signed)
OK to decrease rate for Taxol to 60 minutes with dose reduction to 80 mg/m2.  T.O. Dr Rhys Martini, PharmD

## 2022-10-19 NOTE — Patient Instructions (Addendum)
[facility] at [facility] Discharge Instructions   You were seen and examined today by Dr. Katragadda.  He reviewed the results of your lab work which are normal/stable.   We will proceed with your treatment today.  Return as scheduled.    Thank you for choosing [facility] at [facility] to provide your oncology and hematology care.  To afford each patient quality time with our provider, please arrive at least 15 minutes before your scheduled appointment time.   If you have a lab appointment with the Cancer Center please come in thru the Main Entrance and check in at the main information desk.  You need to re-schedule your appointment should you arrive 10 or more minutes late.  We strive to give you quality time with our providers, and arriving late affects you and other patients whose appointments are after yours.  Also, if you no show three or more times for appointments you may be dismissed from the clinic at the providers discretion.     Again, thank you for choosing [facility].  Our hope is that these requests will decrease the amount of time that you wait before being seen by our physicians.       _____________________________________________________________  Should you have questions after your visit to [facility], please contact our office at [phone] and follow the prompts.  Our office hours are 8:00 a.m. and 4:30 p.m. Monday - Friday.  Please note that voicemails left after 4:00 p.m. may not be returned until the following business day.  We are closed weekends and major holidays.  You do have access to a nurse 24-7, just call the main number to the clinic [phone] and do not press any options, hold on the line and a nurse will answer the phone.    For prescription refill requests, have your pharmacy contact our office and allow 72 hours.    Due to Covid, you will need to wear a mask upon entering  the hospital. If you do not have a mask, a mask will be given to you at the Main Entrance upon arrival. For doctor visits, patients may have 1 support person age 18 or older with them. For treatment visits, patients can not have anyone with them due to social distancing guidelines and our immunocompromised population.      

## 2022-10-19 NOTE — Patient Instructions (Signed)
[facility]  Discharge Instructions: Thank you for choosing [facility] to provide your oncology and hematology care.  If you have a lab appointment with the [facility], please come in thru the Main Entrance and check in at the main information desk.  Wear comfortable clothing and clothing appropriate for easy access to any Portacath or PICC line.   We strive to give you quality time with your provider. You may need to reschedule your appointment if you arrive late (15 or more minutes).  Arriving late affects you and other patients whose appointments are after yours.  Also, if you miss three or more appointments without notifying the office, you may be dismissed from the clinic at the provider's discretion.      For prescription refill requests, have your pharmacy contact our office and allow 72 hours for refills to be completed.    Today you received the following chemotherapy and/or immunotherapy agents Keytruda/Taxol/Carboplatin      To help prevent nausea and vomiting after your treatment, we encourage you to take your nausea medication as directed.  BELOW ARE SYMPTOMS THAT SHOULD BE REPORTED IMMEDIATELY: *FEVER GREATER THAN 100.4 F (38 C) OR HIGHER *CHILLS OR SWEATING *NAUSEA AND VOMITING THAT IS NOT CONTROLLED WITH YOUR NAUSEA MEDICATION *UNUSUAL SHORTNESS OF BREATH *UNUSUAL BRUISING OR BLEEDING *URINARY PROBLEMS (pain or burning when urinating, or frequent urination) *BOWEL PROBLEMS (unusual diarrhea, constipation, pain near the anus) TENDERNESS IN MOUTH AND THROAT WITH OR WITHOUT PRESENCE OF ULCERS (sore throat, sores in mouth, or a toothache) UNUSUAL RASH, SWELLING OR PAIN  UNUSUAL VAGINAL DISCHARGE OR ITCHING   Items with * indicate a potential emergency and should be followed up as soon as possible or go to the Emergency Department if any problems should occur.  Please show the CHEMOTHERAPY ALERT CARD or IMMUNOTHERAPY ALERT CARD at  check-in to the Emergency Department and triage nurse.  Should you have questions after your visit or need to cancel or reschedule your appointment, please contact [facility] [phone]  and follow the prompts.  Office hours are 8:00 a.m. to 4:30 p.m. Monday - Friday. Please note that voicemails left after 4:00 p.m. may not be returned until the following business day.  We are closed weekends and major holidays. You have access to a nurse at all times for urgent questions. Please call the main number to the clinic [phone] and follow the prompts.  For any non-urgent questions, you may also contact your provider using MyChart. We now offer e-Visits for anyone 56 and older to request care online for non-urgent symptoms. For details visit [email or web address].   Also download the MyChart app! Go to the app store, search "MyChart", open the app, select [facility], and log in with your MyChart username and password.

## 2022-10-19 NOTE — Progress Notes (Signed)
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  PRIOR THERAPY: None  NGS Results: Tp53 mutation positive, MSI-stable, TMB-low, PD-L1 (SA630): Negative  CURRENT THERAPY: Carboplatin, paclitaxel and pembrolizumab  BRIEF ONCOLOGIC HISTORY:  Oncology History  Squamous cell carcinoma of head and neck ([place])  07/16/2022 Initial Diagnosis   Squamous cell carcinoma of head and neck ([place])   07/23/2022 -  Chemotherapy   Patient is on Treatment Plan : Carboplatin  + Paclitaxel  + Pembrolizumab (200) D1 q21d       CANCER STAGING:  Cancer Staging  Squamous cell carcinoma of head and neck ([place]) Staging form: Cervical Lymph Nodes and Unknown Primary Tumors of the Head and Neck, AJCC 8th Edition - Clinical stage from 07/16/2022: Stage IVC (cT0, cN3b, cM1) - Unsigned    INTERVAL HISTORY:  [name] 89 y.o. male seen for follow-up of squamous cell carcinoma and toxicity assessment prior to next cycle of chemotherapy.  He received cycle 4 of chemotherapy on 09/28/2022.  He complained of more leg pains after last treatment which lasted about 4 to 5 days.  Tylenol helped.  Also reported more burning in the feet.  He felt weak for 5 days and stayed in recliner most of the time.  He also reported left arm feeling numb and tingly and weak for couple of times which improved within few minutes.    REVIEW OF SYSTEMS:  Review of Systems  Respiratory:  Positive for cough.   Neurological:  Positive for dizziness and numbness (On and off numbness in the fingertips).  All other systems reviewed and are negative.    PAST MEDICAL/SURGICAL HISTORY:  Past Medical History:  Diagnosis Date   Anemia    Atrial fibrillation (HCC)    Cataract    Cellulitis    In past   Complication of  anesthesia    HARD TIME WAKING; CAUSES MY BP TO GO UP    Coronary artery disease    5 bypasses   DJD (degenerative joint disease)    Ejection fraction < 50%    Mildly reduced, 40% by echo   GERD (gastroesophageal reflux disease)    Hyperlipidemia    Hypertension    Persistent atrial fibrillation ([place])    Prostate hypertrophy    on CT scan 09/2014   PUD (peptic ulcer disease)    RESOLVED    Skin cancer of eyelid    Resected   Past Surgical History:  Procedure Laterality Date   APPENDECTOMY     BYPASS GRAFT  2005   CATARACT EXTRACTION W/PHACO Left 07/22/2015   Procedure: CATARACT EXTRACTION PHACO AND INTRAOCULAR LENS PLACEMENT LEFT EYE CDE=8.14;  Surgeon: Greg Branch, MD;  Location: [facility];  Service: Ophthalmology;  Laterality: Left;   CATARACT EXTRACTION W/PHACO Right 08/18/2019   Procedure: CATARACT EXTRACTION PHACO AND INTRAOCULAR LENS PLACEMENT (IOC);  Surgeon: Greg Goldmann, MD;  Location: [facility];  Service: Ophthalmology;  Laterality: Right;  CDE: 9.74   CORONARY ARTERY BYPASS GRAFT  4/05   LIMA to LAD, SVG to PDA, SVG to ramus intermediate   EYE SURGERY  07/2015   EYELID CARCINOMA EXCISION     Skin cancer resected   Heart bypass  2005   LYMPH NODE BIOPSY Right 06/09/2022   Procedure: LYMPH NODE BIOPSY; supraclavicular;  Surgeon: Greg Cagey, MD;  Location: [facility];  Service: General;  Laterality: Right;   PORTACATH PLACEMENT Left 06/09/2022   Procedure: INSERTION PORT-A-CATH;  Surgeon: Greg Cagey, MD;  Location: [facility];  Service: General;  Laterality: Left;   TOTAL HIP ARTHROPLASTY     Right   TOTAL HIP ARTHROPLASTY Left 05/04/2018   Procedure: LEFT TOTAL HIP ARTHROPLASTY;  Surgeon: Greg Maudlin, MD;  Location: [facility];  Service: Orthopedics;  Laterality: Left;   TOTAL KNEE ARTHROPLASTY     Right     SOCIAL HISTORY:  Social History   Socioeconomic History   Marital status: Married    Spouse name: [name]   Number of children: 1   Years of education:  10   Highest education level: 10th grade  Occupational History   Occupation: retired  Tobacco Use   Smoking status: Former    Packs/day: 2.00    Years: 5.00    Total pack years: 10.00    Types: Cigarettes    Quit date: 12/23/1963    Years since quitting: 58.8   Smokeless tobacco: Never  Vaping Use   Vaping Use: Never used  Substance and Sexual Activity   Alcohol use: No   Drug use: No   Sexual activity: Yes  Other Topics Concern   Not on file  Social History Narrative   Lives in a split level home.   Social Determinants of Health   Financial Resource Strain: Low Risk  (12/11/2021)   Overall Financial Resource Strain (CARDIA)    Difficulty of Paying Living Expenses: Not hard at all  Food Insecurity: No Food Insecurity (12/11/2021)   Hunger Vital Sign    Worried About Running Out of Food in the Last Year: Never true    Ran Out of Food in the Last Year: Never true  Transportation Needs: No Transportation Needs (12/11/2021)   PRAPARE - Hydrologist (Medical): No    Lack of Transportation (Non-Medical): No  Physical Activity: Insufficiently Active (12/11/2021)   Exercise Vital Sign    Days of Exercise per Week: 7 days    Minutes of Exercise per Session: 20 min  Stress: No Stress Concern Present (12/11/2021)   [place]    Feeling of Stress : Only a little  Recent Concern: Stress - Stress Concern Present (10/31/2021)   [place]    Feeling of Stress : To some extent  Social Connections: Moderately Isolated (12/11/2021)   Social Connection and Isolation Panel [NHANES]    Frequency of Communication with Friends and Family: More than three times a week    Frequency of Social Gatherings with Friends and Family: More than three times a week    Attends Religious Services: Never    Marine scientist or Organizations: No     Attends Archivist Meetings: Never    Marital Status: Married  Human resources officer Violence: Not At Risk (12/11/2021)   Humiliation, Afraid, Rape, and Kick questionnaire    Fear of Current or Ex-Partner: No    Emotionally Abused: No    Physically Abused: No    Sexually Abused: No    FAMILY HISTORY:  Family History  Problem Relation Age of Onset   Stroke Mother    Stroke Father    Hypertension Father    Early death Brother        51 months old  Cancer Brother        lung   Stroke Brother        heat    CURRENT MEDICATIONS:  Outpatient Encounter Medications as of 10/19/2022  Medication Sig   acetaminophen (TYLENOL) 500 MG tablet Take 1,000 mg by mouth every 6 (six) hours as needed for moderate pain.   CARBOPLATIN IV Inject into the vein every 21 ( twenty-one) days.   cetirizine-pseudoephedrine (ZYRTEC-D) 5-120 MG tablet Take once per day as need for congestion/runny nose   Cholecalciferol (VITAMIN D3) 5000 units CAPS Take 5,000 Units by mouth once a week.    diltiazem (CARDIZEM CD) 120 MG 24 hr capsule Take one capsule daily   ezetimibe (ZETIA) 10 MG tablet Take 1 tablet (10 mg total) by mouth daily.   finasteride (PROSCAR) 5 MG tablet TAKE ONE (1) TABLET EACH DAY   fluticasone (FLONASE) 50 MCG/ACT nasal spray Place 1 spray into both nostrils daily as needed for allergies or rhinitis.   furosemide (LASIX) 20 MG tablet Take 1 tablet (20 mg total) by mouth daily.   lidocaine-prilocaine (EMLA) cream Apply a small amount to port a cath site and cover with plastic wrap 1 hour prior to chemotherapy appointments   lovastatin (MEVACOR) 20 MG tablet TAKE 1/2 TABLET EVERY OTHER DAY   metoprolol tartrate (LOPRESSOR) 100 MG tablet TAKE ONE (1) TABLET BY MOUTH TWO (2) TIMES DAILY   montelukast (SINGULAIR) 10 MG tablet TAKE ONE TABLET BY MOUTH AT BEDTIME   PACLITAXEL IV Inject into the vein every 21 ( twenty-one) days.   PEMBROLIZUMAB IV Inject into the vein every 21 ( twenty-one) days.    prochlorperazine (COMPAZINE) 10 MG tablet Take 1 tablet (10 mg total) by mouth every 6 (six) hours as needed for nausea or vomiting.   tobramycin (TOBREX) 0.3 % ophthalmic solution    warfarin (COUMADIN) 5 MG tablet TAKE 1 TABLET ON SAT, SUN, TUES, & THURSTAKE 1/2 TABLET ON MON, WED, & FRIDAY   No facility-administered encounter medications on file as of 10/19/2022.    ALLERGIES:  Allergies  Allergen Reactions   Penicillins Hives and Rash    Has patient had a PCN reaction causing immediate rash, facial/tongue/throat swelling, SOB or lightheadedness with hypotension: Yes Has patient had a PCN reaction causing severe rash involving mucus membranes or skin necrosis: Yes Has patient had a PCN reaction that required hospitalization: No Has patient had a PCN reaction occurring within the last 10 years: No If all of the above answers are "NO", then may proceed with Cephalosporin use.    Hct [Hydrochlorothiazide] Other (See Comments)    hyper   Statins Other (See Comments)    Myalgia, tolerates low dosages of lovastatin      PHYSICAL EXAM:  ECOG Performance status: 1  There were no vitals filed for this visit.  There were no vitals filed for this visit.  Physical Exam Vitals reviewed.  Constitutional:      Appearance: Normal appearance.  Cardiovascular:     Rate and Rhythm: Regular rhythm.     Heart sounds: Normal heart sounds.  Pulmonary:     Breath sounds: Normal breath sounds.  Neurological:     Mental Status: He is alert.  Psychiatric:        Mood and Affect: Mood normal.        Behavior: Behavior normal.   Neck and right axillary lymph nodes palpable.  LABORATORY DATA:  I have reviewed the labs as listed.  CBC  Component Value Date/Time   WBC 15.4 (H) 09/28/2022 0821   RBC 3.67 (L) 09/28/2022 0821   HGB 12.5 (L) 09/28/2022 0821   HGB 13.3 05/22/2022 0955   HCT 38.7 (L) 09/28/2022 0821   HCT 39.4 05/22/2022 0955   PLT 348 09/28/2022 0821   PLT 372 05/22/2022  0955   MCV 105.4 (H) 09/28/2022 0821   MCV 98 (H) 05/22/2022 0955   MCH 34.1 (H) 09/28/2022 0821   MCHC 32.3 09/28/2022 0821   RDW 14.6 09/28/2022 0821   RDW 11.6 05/22/2022 0955   LYMPHSABS 3.9 09/28/2022 0821   LYMPHSABS 5.1 (H) 05/22/2022 0955   MONOABS 1.4 (H) 09/28/2022 0821   EOSABS 0.2 09/28/2022 0821   EOSABS 0.3 05/22/2022 0955   BASOSABS 0.1 09/28/2022 0821   BASOSABS 0.1 05/22/2022 0955      Latest Ref Rng & Units 09/28/2022    8:21 AM 09/07/2022    8:06 AM 08/13/2022    8:17 AM  CMP  Glucose 70 - 99 mg/dL 104  144  105   BUN 8 - 23 mg/dL 16  19  12    Creatinine 0.61 - 1.24 mg/dL 0.93  0.99  0.88   Sodium 135 - 145 mmol/L 141  138  138   Potassium 3.5 - 5.1 mmol/L 4.1  3.8  4.0   Chloride 98 - 111 mmol/L 108  107  104   CO2 22 - 32 mmol/L 27  26  27    Calcium 8.9 - 10.3 mg/dL 8.8  8.7  8.7   Total Protein 6.5 - 8.1 g/dL 6.4  6.2  6.8   Total Bilirubin 0.3 - 1.2 mg/dL 0.6  0.5  0.8   Alkaline Phos 38 - 126 U/L 82  65  72   AST 15 - 41 U/L 17  16  14    ALT 0 - 44 U/L 15  13  13      DIAGNOSTIC IMAGING:  I have independently reviewed the scans and discussed with the patient.  ASSESSMENT: 1.  Right supraclavicular lymphadenopathy and multiple lung nodules: - Patient noticed right neck mass since Christmas 2022.  10 to 15 pound weight loss since January 2023.  Denies any dysphagia or odynophagia. - CT neck (05/27/2022): Numerous lymph nodes in the right neck, right supraclavicular lymph node mass measures 5.7 x 3.7 cm.  18 mm posterior lymph node in the right lower neck.  Numerous lymph nodes in the right lower and posterior neck approximately 1 cm.  Many have internal necrosis consistent with metastatic disease.  20 mm lymph node just above the right clavicle.  Left level 2 node 12 mm. - CT CAP (05/28/2022): Several bilateral lung nodules, RUL nodule measuring 1.8 x 1.3 cm.  Small clusters of nodules in the medial left upper lobe.  Irregular nodular density along the  left side of the mediastinum measuring 2.1 cm.  No metastatic disease in the abdomen or pelvis.  Liver is slightly nodular contour. - Lab work shows elevated white count since 2009, predominantly lymphocytosis. - Pathology (06/09/2022): Metastatic carcinoma positive for p40, p63 and CK5/6-patchy positivity with CK7.  Negative for TTF-1, Napsin A, PAX8, prostein, PSA, CK20 and CDX2.  - PET scan (06/11/2022): Bulky right supraclavicular nodal mass, smaller hypermetabolic right posterior triangle and jugular lymph nodes and single hypermetabolic left level 3 lymph node.  Bilateral hypermetabolic pulmonary nodules and metastatic pattern.  Minimal hypermetabolic mediastinal nodal metastasis.  Cluster of hypermetabolic right axillary lymph nodes.  No evidence of metastatic  disease or primary lesion in the abdomen or pelvis.  No bone lesions. - MRI of the brain: Chronic small vessel ischemic changes with no evidence of metastatic disease. - NGS: T p53 pathogenic variant, MSI-stable, TMB-low, LOH-low, PD-L1 (BX038): Negative, p16 and p18 negative - Cancer type ID: 96% probability squamous cell carcinoma, subtype head and neck/skin.  Cancer types ruled out with 95% confidence includes skin basal cell carcinoma. - Cycle 1 of carboplatin, Taxol and pembrolizumab on 07/23/2022  2.  Social/family history: - He lives at home with his wife.  Independent of ADLs and IADLs.  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  May have had asbestos exposure 30 years ago.  Quit smoking cigarettes in 1965.  Smoked 1 pack/day for less than 12 years.  2 of his brothers had lung cancer and both were smokers.   PLAN:  1.  Metastatic squamous cell carcinoma, presumed head and neck primary: - He has tolerated cycle 4 reasonably well except more leg pains. - Reviewed labs today which showed normal LFTs.  CBC was grossly normal. - Recommend proceeding with cycle 5 today with dose reduction of  paclitaxel.  RTC 3 weeks for follow-up.  2.  Neuropathy: - He reported leg pains which lasted 4 to 5 days after last treatment along with burning in the feet.  Tylenol helped. - He also had episodes of left arm weakness twice in the last 3 weeks which recovered in few minutes. 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  Will further dose reduce paclitaxel to 80 mg/m.  Orders placed this encounter:  No orders of the defined types were placed in this encounter.     Derek Jack, MD [place] [phone]

## 2022-10-19 NOTE — Progress Notes (Signed)
Patient has been examined by Dr. Delton Coombes, and vital signs and labs have been reviewed. ANC, Creatinine, LFTs, hemoglobin, and platelets are within treatment parameters per M.D. - pt may proceed with treatment.  Taxol dose reduced per MD to 80 mg/m2. Primary RN and pharmacy notified.

## 2022-10-21 ENCOUNTER — Inpatient Hospital Stay: Payer: Medicare Other

## 2022-10-21 VITALS — BP 106/60 | HR 71 | Resp 18

## 2022-10-21 VITALS — BP 92/54 | HR 87 | Temp 97.8°F | Resp 18

## 2022-10-21 DIAGNOSIS — C771 Secondary and unspecified malignant neoplasm of intrathoracic lymph nodes: Secondary | ICD-10-CM | POA: Diagnosis not present

## 2022-10-21 DIAGNOSIS — G629 Polyneuropathy, unspecified: Secondary | ICD-10-CM | POA: Diagnosis not present

## 2022-10-21 DIAGNOSIS — Z5189 Encounter for other specified aftercare: Secondary | ICD-10-CM | POA: Diagnosis not present

## 2022-10-21 DIAGNOSIS — Z79899 Other long term (current) drug therapy: Secondary | ICD-10-CM | POA: Diagnosis not present

## 2022-10-21 DIAGNOSIS — R918 Other nonspecific abnormal finding of lung field: Secondary | ICD-10-CM | POA: Diagnosis not present

## 2022-10-21 DIAGNOSIS — C760 Malignant neoplasm of head, face and neck: Secondary | ICD-10-CM | POA: Diagnosis not present

## 2022-10-21 DIAGNOSIS — Z95828 Presence of other vascular implants and grafts: Secondary | ICD-10-CM

## 2022-10-21 DIAGNOSIS — Z5112 Encounter for antineoplastic immunotherapy: Secondary | ICD-10-CM | POA: Diagnosis not present

## 2022-10-21 DIAGNOSIS — Z801 Family history of malignant neoplasm of trachea, bronchus and lung: Secondary | ICD-10-CM | POA: Diagnosis not present

## 2022-10-21 DIAGNOSIS — Z5111 Encounter for antineoplastic chemotherapy: Secondary | ICD-10-CM | POA: Diagnosis not present

## 2022-10-21 DIAGNOSIS — I10 Essential (primary) hypertension: Secondary | ICD-10-CM | POA: Diagnosis not present

## 2022-10-21 DIAGNOSIS — Z87891 Personal history of nicotine dependence: Secondary | ICD-10-CM | POA: Diagnosis not present

## 2022-10-21 MED ORDER — PEGFILGRASTIM-CBQV 6 MG/0.6ML ~~LOC~~ SOSY
6.0000 mg | PREFILLED_SYRINGE | Freq: Once | SUBCUTANEOUS | Status: AC
  Administered 2022-10-21: 6 mg via SUBCUTANEOUS
  Filled 2022-10-21: qty 0.6

## 2022-10-21 MED ORDER — SODIUM CHLORIDE 0.9% FLUSH
10.0000 mL | Freq: Once | INTRAVENOUS | Status: AC
  Administered 2022-10-21: 10 mL via INTRAVENOUS

## 2022-10-21 MED ORDER — SODIUM CHLORIDE 0.9 % IV SOLN: Freq: Once | INTRAVENOUS | Status: AC

## 2022-10-21 MED ORDER — ONDANSETRON HCL 4 MG/2ML IJ SOLN
8.0000 mg | Freq: Once | INTRAMUSCULAR | Status: AC
  Administered 2022-10-21: 8 mg via INTRAVENOUS
  Filled 2022-10-21: qty 4

## 2022-10-21 MED ORDER — SODIUM CHLORIDE 0.9 % IV SOLN: 8.0000 mg | INTRAVENOUS | Status: DC | PRN

## 2022-10-21 MED ORDER — HEPARIN SOD (PORK) LOCK FLUSH 100 UNIT/ML IV SOLN
500.0000 [IU] | Freq: Once | INTRAVENOUS | Status: AC
  Administered 2022-10-21: 500 [IU] via INTRAVENOUS

## 2022-10-21 NOTE — Progress Notes (Signed)
Patient tolerated therapy with no complaints voiced. Side effects with management reviewed with understanding verbalized. Port site clean and dry with no bruising or swelling noted at site. Good blood return noted before and after administration of therapy. Band aid applied. Patient left in satisfactory condition with VSS and no s/s of distress noted.

## 2022-10-21 NOTE — Progress Notes (Signed)
Patient presents today for Udenyca injection. Blood pressure on arrival 92/54. Patient has complaints of being sick since his treatment on 10/19/2022. Patient states to the injection nurse this is his first time feeling this way. Message received from [facility] / A. Anderson RN to administer 1 liter of normal saline over 2 hours and Zofran 8 mg IV for nausea.

## 2022-10-21 NOTE — Patient Instructions (Signed)
[facility]  Discharge Instructions: Thank you for choosing [facility] to provide your oncology and hematology care.  If you have a lab appointment with the [facility], please come in thru the Main Entrance and check in at the main information desk.  Wear comfortable clothing and clothing appropriate for easy access to any Portacath or PICC line.   We strive to give you quality time with your provider. You may need to reschedule your appointment if you arrive late (15 or more minutes).  Arriving late affects you and other patients whose appointments are after yours.  Also, if you miss three or more appointments without notifying the office, you may be dismissed from the clinic at the provider's discretion.      For prescription refill requests, have your pharmacy contact our office and allow 72 hours for refills to be completed.    Today you received the following Hydration fluids and zofran, return as scheduled.   To help prevent nausea and vomiting after your treatment, we encourage you to take your nausea medication as directed.  BELOW ARE SYMPTOMS THAT SHOULD BE REPORTED IMMEDIATELY: *FEVER GREATER THAN 100.4 F (38 C) OR HIGHER *CHILLS OR SWEATING *NAUSEA AND VOMITING THAT IS NOT CONTROLLED WITH YOUR NAUSEA MEDICATION *UNUSUAL SHORTNESS OF BREATH *UNUSUAL BRUISING OR BLEEDING *URINARY PROBLEMS (pain or burning when urinating, or frequent urination) *BOWEL PROBLEMS (unusual diarrhea, constipation, pain near the anus) TENDERNESS IN MOUTH AND THROAT WITH OR WITHOUT PRESENCE OF ULCERS (sore throat, sores in mouth, or a toothache) UNUSUAL RASH, SWELLING OR PAIN  UNUSUAL VAGINAL DISCHARGE OR ITCHING   Items with * indicate a potential emergency and should be followed up as soon as possible or go to the Emergency Department if any problems should occur.  Please show the CHEMOTHERAPY ALERT CARD or IMMUNOTHERAPY ALERT CARD at check-in to the Emergency  Department and triage nurse.  Should you have questions after your visit or need to cancel or reschedule your appointment, please contact [facility] [phone]  and follow the prompts.  Office hours are 8:00 a.m. to 4:30 p.m. Monday - Friday. Please note that voicemails left after 4:00 p.m. may not be returned until the following business day.  We are closed weekends and major holidays. You have access to a nurse at all times for urgent questions. Please call the main number to the clinic [phone] and follow the prompts.  For any non-urgent questions, you may also contact your provider using MyChart. We now offer e-Visits for anyone 24 and older to request care online for non-urgent symptoms. For details visit [email or web address].   Also download the MyChart app! Go to the app store, search "MyChart", open the app, select [facility], and log in with your MyChart username and password.

## 2022-11-03 ENCOUNTER — Other Ambulatory Visit: Payer: Self-pay

## 2022-11-03 DIAGNOSIS — C760 Malignant neoplasm of head, face and neck: Secondary | ICD-10-CM

## 2022-11-09 ENCOUNTER — Encounter: Payer: Self-pay | Admitting: Hematology

## 2022-11-09 ENCOUNTER — Inpatient Hospital Stay: Payer: Medicare Other

## 2022-11-09 ENCOUNTER — Inpatient Hospital Stay: Payer: Medicare Other | Admitting: Dietician

## 2022-11-09 ENCOUNTER — Inpatient Hospital Stay: Payer: Medicare Other | Admitting: Hematology

## 2022-11-09 VITALS — BP 165/89 | HR 90 | Temp 97.4°F | Resp 18

## 2022-11-09 DIAGNOSIS — Z95828 Presence of other vascular implants and grafts: Secondary | ICD-10-CM | POA: Diagnosis not present

## 2022-11-09 DIAGNOSIS — C760 Malignant neoplasm of head, face and neck: Secondary | ICD-10-CM

## 2022-11-09 DIAGNOSIS — G629 Polyneuropathy, unspecified: Secondary | ICD-10-CM | POA: Diagnosis not present

## 2022-11-09 DIAGNOSIS — R918 Other nonspecific abnormal finding of lung field: Secondary | ICD-10-CM | POA: Diagnosis not present

## 2022-11-09 DIAGNOSIS — I10 Essential (primary) hypertension: Secondary | ICD-10-CM | POA: Diagnosis not present

## 2022-11-09 DIAGNOSIS — Z5111 Encounter for antineoplastic chemotherapy: Secondary | ICD-10-CM | POA: Diagnosis not present

## 2022-11-09 DIAGNOSIS — Z87891 Personal history of nicotine dependence: Secondary | ICD-10-CM | POA: Diagnosis not present

## 2022-11-09 DIAGNOSIS — Z5189 Encounter for other specified aftercare: Secondary | ICD-10-CM | POA: Diagnosis not present

## 2022-11-09 DIAGNOSIS — Z801 Family history of malignant neoplasm of trachea, bronchus and lung: Secondary | ICD-10-CM | POA: Diagnosis not present

## 2022-11-09 DIAGNOSIS — Z5112 Encounter for antineoplastic immunotherapy: Secondary | ICD-10-CM | POA: Diagnosis not present

## 2022-11-09 DIAGNOSIS — C771 Secondary and unspecified malignant neoplasm of intrathoracic lymph nodes: Secondary | ICD-10-CM | POA: Diagnosis not present

## 2022-11-09 DIAGNOSIS — Z79899 Other long term (current) drug therapy: Secondary | ICD-10-CM | POA: Diagnosis not present

## 2022-11-09 LAB — MAGNESIUM: Magnesium: 2.1 mg/dL (ref 1.7–2.4)

## 2022-11-09 LAB — CBC WITH DIFFERENTIAL/PLATELET
Abs Immature Granulocytes: 0.05 10*3/uL (ref 0.00–0.07)
Basophils Absolute: 0.1 10*3/uL (ref 0.0–0.1)
Basophils Relative: 1 %
Eosinophils Absolute: 0.2 10*3/uL (ref 0.0–0.5)
Eosinophils Relative: 2 %
HCT: 36.7 % — ABNORMAL LOW (ref 39.0–52.0)
Hemoglobin: 11.6 g/dL — ABNORMAL LOW (ref 13.0–17.0)
Immature Granulocytes: 1 %
Lymphocytes Relative: 27 %
Lymphs Abs: 2.6 10*3/uL (ref 0.7–4.0)
MCH: 34.5 pg — ABNORMAL HIGH (ref 26.0–34.0)
MCHC: 31.6 g/dL (ref 30.0–36.0)
MCV: 109.2 fL — ABNORMAL HIGH (ref 80.0–100.0)
Monocytes Absolute: 0.8 10*3/uL (ref 0.1–1.0)
Monocytes Relative: 8 %
Neutro Abs: 6 10*3/uL (ref 1.7–7.7)
Neutrophils Relative %: 61 %
Platelets: 234 10*3/uL (ref 150–400)
RBC: 3.36 MIL/uL — ABNORMAL LOW (ref 4.22–5.81)
RDW: 14.9 % (ref 11.5–15.5)
WBC: 9.8 10*3/uL (ref 4.0–10.5)
nRBC: 0 % (ref 0.0–0.2)

## 2022-11-09 LAB — COMPREHENSIVE METABOLIC PANEL
ALT: 13 U/L (ref 0–44)
AST: 19 U/L (ref 15–41)
Albumin: 3.7 g/dL (ref 3.5–5.0)
Alkaline Phosphatase: 87 U/L (ref 38–126)
Anion gap: 7 (ref 5–15)
BUN: 17 mg/dL (ref 8–23)
CO2: 27 mmol/L (ref 22–32)
Calcium: 8.7 mg/dL — ABNORMAL LOW (ref 8.9–10.3)
Chloride: 105 mmol/L (ref 98–111)
Creatinine, Ser: 0.92 mg/dL (ref 0.61–1.24)
GFR, Estimated: >60 mL/min (ref >60)
Glucose, Bld: 118 mg/dL — ABNORMAL HIGH (ref 70–99)
Potassium: 3.7 mmol/L (ref 3.5–5.1)
Sodium: 139 mmol/L (ref 135–145)
Total Bilirubin: 0.5 mg/dL (ref 0.3–1.2)
Total Protein: 6.4 g/dL — ABNORMAL LOW (ref 6.5–8.1)

## 2022-11-09 LAB — TSH: TSH: 1.123 u[IU]/mL (ref 0.350–4.500)

## 2022-11-09 MED ORDER — SODIUM CHLORIDE 0.9 % IV SOLN
150.0000 mg | Freq: Once | INTRAVENOUS | Status: AC
  Administered 2022-11-09: 150 mg via INTRAVENOUS
  Filled 2022-11-09: qty 150

## 2022-11-09 MED ORDER — SODIUM CHLORIDE 0.9 % IV SOLN
10.0000 mg | Freq: Once | INTRAVENOUS | Status: AC
  Administered 2022-11-09: 10 mg via INTRAVENOUS
  Filled 2022-11-09: qty 10

## 2022-11-09 MED ORDER — SODIUM CHLORIDE 0.9 % IV SOLN: Freq: Once | INTRAVENOUS | Status: AC

## 2022-11-09 MED ORDER — CETIRIZINE HCL 10 MG/ML IV SOLN
10.0000 mg | Freq: Once | INTRAVENOUS | Status: AC
  Administered 2022-11-09: 10 mg via INTRAVENOUS
  Filled 2022-11-09: qty 1

## 2022-11-09 MED ORDER — SODIUM CHLORIDE 0.9 % IV SOLN
200.0000 mg | Freq: Once | INTRAVENOUS | Status: AC
  Administered 2022-11-09: 200 mg via INTRAVENOUS
  Filled 2022-11-09: qty 8

## 2022-11-09 MED ORDER — PALONOSETRON HCL INJECTION 0.25 MG/5ML
0.2500 mg | Freq: Once | INTRAVENOUS | Status: AC
  Administered 2022-11-09: 0.25 mg via INTRAVENOUS
  Filled 2022-11-09: qty 5

## 2022-11-09 MED ORDER — SODIUM CHLORIDE 0.9% FLUSH
10.0000 mL | INTRAVENOUS | Status: DC | PRN
  Administered 2022-11-09: 10 mL

## 2022-11-09 MED ORDER — SODIUM CHLORIDE 0.9 % IV SOLN
80.0000 mg/m2 | Freq: Once | INTRAVENOUS | Status: AC
  Administered 2022-11-09: 156 mg via INTRAVENOUS
  Filled 2022-11-09: qty 26

## 2022-11-09 MED ORDER — HEPARIN SOD (PORK) LOCK FLUSH 100 UNIT/ML IV SOLN
500.0000 [IU] | Freq: Once | INTRAVENOUS | Status: AC | PRN
  Administered 2022-11-09: 500 [IU]

## 2022-11-09 MED ORDER — FAMOTIDINE IN NACL 20-0.9 MG/50ML-% IV SOLN
20.0000 mg | Freq: Once | INTRAVENOUS | Status: AC
  Administered 2022-11-09: 20 mg via INTRAVENOUS
  Filled 2022-11-09: qty 50

## 2022-11-09 MED ORDER — SODIUM CHLORIDE 0.9 % IV SOLN
301.0000 mg | Freq: Once | INTRAVENOUS | Status: AC
  Administered 2022-11-09: 300 mg via INTRAVENOUS
  Filled 2022-11-09: qty 30

## 2022-11-09 MED ORDER — SODIUM CHLORIDE 0.9% FLUSH
10.0000 mL | Freq: Once | INTRAVENOUS | Status: AC
  Administered 2022-11-09: 10 mL via INTRAVENOUS

## 2022-11-09 NOTE — Progress Notes (Signed)
Patient has been examined by Dr. Katragadda, and vital signs and labs have been reviewed. ANC, Creatinine, LFTs, hemoglobin, and platelets are within treatment parameters per M.D. - pt may proceed with treatment.  Primary RN and pharmacy notified.  

## 2022-11-09 NOTE — Patient Instructions (Addendum)
[facility] at [facility] Discharge Instructions   You were seen and examined today by Dr. Delton Coombes.  He reviewed the results of your lab work which are normal/stable.   We will proceed with your treatment today. After today's treatment, we will discontinue chemotherapy from your treatment. We will continue giving you the immunotherapy (Keytruda) every 3 weeks.   Return as scheduled.   Thank you for choosing [facility] at [facility] to provide your oncology and hematology care.  To afford each patient quality time with our provider, please arrive at least 15 minutes before your scheduled appointment time.   If you have a lab appointment with the [facility] please come in thru the Main Entrance and check in at the main information desk.  You need to re-schedule your appointment should you arrive 10 or more minutes late.  We strive to give you quality time with our providers, and arriving late affects you and other patients whose appointments are after yours.  Also, if you no show three or more times for appointments you may be dismissed from the clinic at the providers discretion.     Again, thank you for choosing [facility].  Our hope is that these requests will decrease the amount of time that you wait before being seen by our physicians.       _____________________________________________________________  Should you have questions after your visit to [facility], please contact our office at [phone] and follow the prompts.  Our office hours are 8:00 a.m. and 4:30 p.m. Monday - Friday.  Please note that voicemails left after 4:00 p.m. may not be returned until the following business day.  We are closed weekends and major holidays.  You do have access to a nurse 24-7, just call the main number to the clinic [phone] and do not press any options, hold on the line and a nurse will answer the phone.    For  prescription refill requests, have your pharmacy contact our office and allow 72 hours.    Due to Covid, you will need to wear a mask upon entering the hospital. If you do not have a mask, a mask will be given to you at the Main Entrance upon arrival. For doctor visits, patients may have 1 support person age 40 or older with them. For treatment visits, patients can not have anyone with them due to social distancing guidelines and our immunocompromised population.

## 2022-11-09 NOTE — Progress Notes (Signed)
Labs reviewed with MD today, ok to treat per MD.

## 2022-11-09 NOTE — Progress Notes (Signed)
Patients port flushed without difficulty.  Good blood return noted with no bruising or swelling noted at site.  Stable during access and blood draw.  Patient to remain accessed for treatment. 

## 2022-11-09 NOTE — Patient Instructions (Signed)
[facility]  Discharge Instructions: Thank you for choosing [facility] to provide your oncology and hematology care.  If you have a lab appointment with the [facility], please come in thru the Main Entrance and check in at the main information desk.  Wear comfortable clothing and clothing appropriate for easy access to any Portacath or PICC line.   We strive to give you quality time with your provider. You may need to reschedule your appointment if you arrive late (15 or more minutes).  Arriving late affects you and other patients whose appointments are after yours.  Also, if you miss three or more appointments without notifying the office, you may be dismissed from the clinic at the provider's discretion.      For prescription refill requests, have your pharmacy contact our office and allow 72 hours for refills to be completed.    Today you received the following chemotherapy and/or immunotherapy agents Keytruda, Taxol, Carboplatin. Carboplatin Injection What is this medication? CARBOPLATIN (KAR boe pla tin) treats some types of cancer. It works by slowing down the growth of cancer cells. This medicine may be used for other purposes; ask your health care provider or pharmacist if you have questions. COMMON BRAND NAME(S): Paraplatin What should I tell my care team before I take this medication? They need to know if you have any of these conditions: Blood disorders Hearing problems Kidney disease Recent or ongoing radiation therapy An unusual or allergic reaction to carboplatin, cisplatin, other medications, foods, dyes, or preservatives Pregnant or trying to get pregnant Breast-feeding How should I use this medication? This medication is injected into a vein. It is given by your care team in a hospital or clinic setting. Talk to your care team about the use of this medication in children. Special care may be needed. Overdosage: If you think you have  taken too much of this medicine contact a poison control center or emergency room at once. NOTE: This medicine is only for you. Do not share this medicine with others. What if I miss a dose? Keep appointments for follow-up doses. It is important not to miss your dose. Call your care team if you are unable to keep an appointment. What may interact with this medication? Medications for seizures Some antibiotics, such as amikacin, gentamicin, neomycin, streptomycin, tobramycin Vaccines This list may not describe all possible interactions. Give your health care provider a list of all the medicines, herbs, non-prescription drugs, or dietary supplements you use. Also tell them if you smoke, drink alcohol, or use illegal drugs. Some items may interact with your medicine. What should I watch for while using this medication? Your condition will be monitored carefully while you are receiving this medication. You may need blood work while taking this medication. This medication may make you feel generally unwell. This is not uncommon, as chemotherapy can affect healthy cells as well as cancer cells. Report any side effects. Continue your course of treatment even though you feel ill unless your care team tells you to stop. In some cases, you may be given additional medications to help with side effects. Follow all directions for their use. This medication may increase your risk of getting an infection. Call your care team for advice if you get a fever, chills, sore throat, or other symptoms of a cold or flu. Do not treat yourself. Try to avoid being around people who are sick. Avoid taking medications that contain aspirin, acetaminophen, ibuprofen, naproxen, or ketoprofen  unless instructed by your care team. These medications may hide a fever. Be careful brushing or flossing your teeth or using a toothpick because you may get an infection or bleed more easily. If you have any dental work done, tell your dentist  you are receiving this medication. Talk to your care team if you wish to become pregnant or think you might be pregnant. This medication can cause serious birth defects. Talk to your care team about effective forms of contraception. Do not breast-feed while taking this medication. What side effects may I notice from receiving this medication? Side effects that you should report to your care team as soon as possible: Allergic reactions--skin rash, itching, hives, swelling of the face, lips, tongue, or throat Infection--fever, chills, cough, sore throat, wounds that don't heal, pain or trouble when passing urine, general feeling of discomfort or being unwell Low red blood cell level--unusual weakness or fatigue, dizziness, headache, trouble breathing Pain, tingling, or numbness in the hands or feet, muscle weakness, change in vision, confusion or trouble speaking, loss of balance or coordination, trouble walking, seizures Unusual bruising or bleeding Side effects that usually do not require medical attention (report to your care team if they continue or are bothersome): Hair loss Nausea Unusual weakness or fatigue Vomiting This list may not describe all possible side effects. Call your doctor for medical advice about side effects. You may report side effects to FDA at 1-800-FDA-1088. Where should I keep my medication? This medication is given in a hospital or clinic. It will not be stored at home. NOTE: This sheet is a summary. It may not cover all possible information. If you have questions about this medicine, talk to your doctor, pharmacist, or health care provider.  2023 Elsevier/Gold Standard (2022-01-12 00:00:00) Paclitaxel Injection What is this medication? PACLITAXEL (PAK li TAX el) treats some types of cancer. It works by slowing down the growth of cancer cells. This medicine may be used for other purposes; ask your health care provider or pharmacist if you have questions. COMMON  BRAND NAME(S): Onxol, Taxol What should I tell my care team before I take this medication? They need to know if you have any of these conditions: Heart disease Liver disease Low white blood cell levels An unusual or allergic reaction to paclitaxel, other medications, foods, dyes, or preservatives If you or your partner are pregnant or trying to get pregnant Breast-feeding How should I use this medication? This medication is injected into a vein. It is given by your care team in a hospital or clinic setting. Talk to your care team about the use of this medication in children. While it may be given to children for selected conditions, precautions do apply. Overdosage: If you think you have taken too much of this medicine contact a poison control center or emergency room at once. NOTE: This medicine is only for you. Do not share this medicine with others. What if I miss a dose? Keep appointments for follow-up doses. It is important not to miss your dose. Call your care team if you are unable to keep an appointment. What may interact with this medication? Do not take this medication with any of the following: Live virus vaccines Other medications may affect the way this medication works. Talk with your care team about all of the medications you take. They may suggest changes to your treatment plan to lower the risk of side effects and to make sure your medications work as intended. This list may not describe  all possible interactions. Give your health care provider a list of all the medicines, herbs, non-prescription drugs, or dietary supplements you use. Also tell them if you smoke, drink alcohol, or use illegal drugs. Some items may interact with your medicine. What should I watch for while using this medication? Your condition will be monitored carefully while you are receiving this medication. You may need blood work while taking this medication. This medication may make you feel generally  unwell. This is not uncommon as chemotherapy can affect healthy cells as well as cancer cells. Report any side effects. Continue your course of treatment even though you feel ill unless your care team tells you to stop. This medication can cause serious allergic reactions. To reduce the risk, your care team may give you other medications to take before receiving this one. Be sure to follow the directions from your care team. This medication may increase your risk of getting an infection. Call your care team for advice if you get a fever, chills, sore throat, or other symptoms of a cold or flu. Do not treat yourself. Try to avoid being around people who are sick. This medication may increase your risk to bruise or bleed. Call your care team if you notice any unusual bleeding. Be careful brushing or flossing your teeth or using a toothpick because you may get an infection or bleed more easily. If you have any dental work done, tell your dentist you are receiving this medication. Talk to your care team if you may be pregnant. Serious birth defects can occur if you take this medication during pregnancy. Talk to your care team before breastfeeding. Changes to your treatment plan may be needed. What side effects may I notice from receiving this medication? Side effects that you should report to your care team as soon as possible: Allergic reactions--skin rash, itching, hives, swelling of the face, lips, tongue, or throat Heart rhythm changes--fast or irregular heartbeat, dizziness, feeling faint or lightheaded, chest pain, trouble breathing Increase in blood pressure Infection--fever, chills, cough, sore throat, wounds that don't heal, pain or trouble when passing urine, general feeling of discomfort or being unwell Low blood pressure--dizziness, feeling faint or lightheaded, blurry vision Low red blood cell level--unusual weakness or fatigue, dizziness, headache, trouble breathing Painful swelling, warmth,  or redness of the skin, blisters or sores at the infusion site Pain, tingling, or numbness in the hands or feet Slow heartbeat--dizziness, feeling faint or lightheaded, confusion, trouble breathing, unusual weakness or fatigue Unusual bruising or bleeding Side effects that usually do not require medical attention (report to your care team if they continue or are bothersome): Diarrhea Hair loss Joint pain Loss of appetite Muscle pain Nausea Vomiting This list may not describe all possible side effects. Call your doctor for medical advice about side effects. You may report side effects to FDA at 1-800-FDA-1088. Where should I keep my medication? This medication is given in a hospital or clinic. It will not be stored at home. NOTE: This sheet is a summary. It may not cover all possible information. If you have questions about this medicine, talk to your doctor, pharmacist, or health care provider.  2023 Elsevier/Gold Standard (2022-01-28 00:00:00) Pembrolizumab Injection What is this medication? PEMBROLIZUMAB (PEM broe LIZ ue mab) treats some types of cancer. It works by helping your immune system slow or stop the spread of cancer cells. It is a monoclonal antibody. This medicine may be used for other purposes; ask your health care provider or pharmacist if  you have questions. COMMON BRAND NAME(S): Keytruda What should I tell my care team before I take this medication? They need to know if you have any of these conditions: Allogeneic stem cell transplant (uses someone else's stem cells) Autoimmune diseases, such as Crohn disease, ulcerative colitis, lupus History of chest radiation Nervous system problems, such as Guillain-Barre syndrome, myasthenia gravis Organ transplant An unusual or allergic reaction to pembrolizumab, other medications, foods, dyes, or preservatives Pregnant or trying to get pregnant Breast-feeding How should I use this medication? This medication is injected into  a vein. It is given by your care team in a hospital or clinic setting. A special MedGuide will be given to you before each treatment. Be sure to read this information carefully each time. Talk to your care team about the use of this medication in children. While it may be prescribed for children as young as 6 months for selected conditions, precautions do apply. Overdosage: If you think you have taken too much of this medicine contact a poison control center or emergency room at once. NOTE: This medicine is only for you. Do not share this medicine with others. What if I miss a dose? Keep appointments for follow-up doses. It is important not to miss your dose. Call your care team if you are unable to keep an appointment. What may interact with this medication? Interactions have not been studied. This list may not describe all possible interactions. Give your health care provider a list of all the medicines, herbs, non-prescription drugs, or dietary supplements you use. Also tell them if you smoke, drink alcohol, or use illegal drugs. Some items may interact with your medicine. What should I watch for while using this medication? Your condition will be monitored carefully while you are receiving this medication. You may need blood work while taking this medication. This medication may cause serious skin reactions. They can happen weeks to months after starting the medication. Contact your care team right away if you notice fevers or flu-like symptoms with a rash. The rash may be red or purple and then turn into blisters or peeling of the skin. You may also notice a red rash with swelling of the face, lips, or lymph nodes in your neck or under your arms. Tell your care team right away if you have any change in your eyesight. Talk to your care team if you may be pregnant. Serious birth defects can occur if you take this medication during pregnancy and for 4 months after the last dose. You will need a  negative pregnancy test before starting this medication. Contraception is recommended while taking this medication and for 4 months after the last dose. Your care team can help you find the option that works for you. Do not breastfeed while taking this medication and for 4 months after the last dose. What side effects may I notice from receiving this medication? Side effects that you should report to your care team as soon as possible: Allergic reactions--skin rash, itching, hives, swelling of the face, lips, tongue, or throat Dry cough, shortness of breath or trouble breathing Eye pain, redness, irritation, or discharge with blurry or decreased vision Heart muscle inflammation--unusual weakness or fatigue, shortness of breath, chest pain, fast or irregular heartbeat, dizziness, swelling of the ankles, feet, or hands Hormone gland problems--headache, sensitivity to light, unusual weakness or fatigue, dizziness, fast or irregular heartbeat, increased sensitivity to cold or heat, excessive sweating, constipation, hair loss, increased thirst or amount of urine, tremors or  shaking, irritability Infusion reactions--chest pain, shortness of breath or trouble breathing, feeling faint or lightheaded Kidney injury (glomerulonephritis)--decrease in the amount of urine, red or dark brown urine, foamy or bubbly urine, swelling of the ankles, hands, or feet Liver injury--right upper belly pain, loss of appetite, nausea, light-colored stool, dark yellow or brown urine, yellowing skin or eyes, unusual weakness or fatigue Pain, tingling, or numbness in the hands or feet, muscle weakness, change in vision, confusion or trouble speaking, loss of balance or coordination, trouble walking, seizures Rash, fever, and swollen lymph nodes Redness, blistering, peeling, or loosening of the skin, including inside the mouth Sudden or severe stomach pain, bloody diarrhea, fever, nausea, vomiting Side effects that usually do not  require medical attention (report to your care team if they continue or are bothersome): Bone, joint, or muscle pain Diarrhea Fatigue Loss of appetite Nausea Skin rash This list may not describe all possible side effects. Call your doctor for medical advice about side effects. You may report side effects to FDA at 1-800-FDA-1088. Where should I keep my medication? This medication is given in a hospital or clinic. It will not be stored at home. NOTE: This sheet is a summary. It may not cover all possible information. If you have questions about this medicine, talk to your doctor, pharmacist, or health care provider.  2023 Elsevier/Gold Standard (2013-06-19 00:00:00)       To help prevent nausea and vomiting after your treatment, we encourage you to take your nausea medication as directed.  BELOW ARE SYMPTOMS THAT SHOULD BE REPORTED IMMEDIATELY: *FEVER GREATER THAN 100.4 F (38 C) OR HIGHER *CHILLS OR SWEATING *NAUSEA AND VOMITING THAT IS NOT CONTROLLED WITH YOUR NAUSEA MEDICATION *UNUSUAL SHORTNESS OF BREATH *UNUSUAL BRUISING OR BLEEDING *URINARY PROBLEMS (pain or burning when urinating, or frequent urination) *BOWEL PROBLEMS (unusual diarrhea, constipation, pain near the anus) TENDERNESS IN MOUTH AND THROAT WITH OR WITHOUT PRESENCE OF ULCERS (sore throat, sores in mouth, or a toothache) UNUSUAL RASH, SWELLING OR PAIN  UNUSUAL VAGINAL DISCHARGE OR ITCHING   Items with * indicate a potential emergency and should be followed up as soon as possible or go to the Emergency Department if any problems should occur.  Please show the CHEMOTHERAPY ALERT CARD or IMMUNOTHERAPY ALERT CARD at check-in to the Emergency Department and triage nurse.  Should you have questions after your visit or need to cancel or reschedule your appointment, please contact [facility] [phone]  and follow the prompts.  Office hours are 8:00 a.m. to 4:30 p.m. Monday - Friday. Please note that  voicemails left after 4:00 p.m. may not be returned until the following business day.  We are closed weekends and major holidays. You have access to a nurse at all times for urgent questions. Please call the main number to the clinic [phone] and follow the prompts.  For any non-urgent questions, you may also contact your provider using MyChart. We now offer e-Visits for anyone 62 and older to request care online for non-urgent symptoms. For details visit [email or web address].   Also download the MyChart app! Go to the app store, search "MyChart", open the app, select [facility], and log in with your MyChart username and password.

## 2022-11-09 NOTE — Progress Notes (Signed)
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 Patient is receiving carboplatin/paclitaxel + keytruda q21d (start 07/23/22). Patient is under the care of Dr. Delton Coombes.   Past medical history includes persistent atrial fibrillation, HLD, HTN, GERD, [name] s/p bypass x5, DJD, anemia  Met with patient during infusion. He reports good appetite, despite diminished taste.  Patient recalls typically eating 4 small meals/day. He does not eat much meat. Says he prefers vegetables. Patient likes fish and occasionally will eat chicken. Patient has 2 slices of peanut butter toast every morning for breakfast. He reports frequently snacking on nuts. Patient eats beans frequently. Patient reports drinking 2 bottles of water and coffee. Patient reports one episode of nausea with vomiting lasting one day after last treatment. He denies constipation or diarrhea.   Nutrition Focused Physical Exam: deferred   Medications: D3, proscar, lasix 20mg /day, lovastatin, lopressor, compazine, warfarin   Labs: glucose 118   Anthropometrics: Weights have decreased 3 lbs (1.8%) in 3 weeks. This is insignificant for time frame, however concerning (daily lasix)  Height: 5'6" Weight: 184 lb 8 oz  UBW: 187 lb  BMI: 29.78   NUTRITION DIAGNOSIS: Food and nutrition related knowledge deficit related to cancer as evidenced by no prior need for associated information   INTERVENTION:  Educated on strategies for altered taste. Suggested pt try baking soda salt water rinses several times daily before meals - handout with tips + recipe provided  Educated on foods with protein, encouraged protein source with all meals/snacks Contact information given    MONITORING, EVALUATION, GOAL: Patient will tolerate increased calories and protein to minimize weight loss during treatment    Next Visit: To be scheduled as needed with treatment

## 2022-11-09 NOTE — Progress Notes (Signed)
Treatment given today per MD orders. Tolerated infusion without adverse affects. Vital signs stable. No complaints at this time. Discharged from clinic by wheel chair in stable condition. Alert and oriented x 3. F/U with [facility] as scheduled.

## 2022-11-09 NOTE — Progress Notes (Signed)
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  PRIOR THERAPY: None  NGS Results: Tp53 mutation positive, MSI-stable, TMB-low, PD-L1 (LP379): Negative  CURRENT THERAPY: Carboplatin, paclitaxel and pembrolizumab  BRIEF ONCOLOGIC HISTORY:  Oncology History  Squamous cell carcinoma of head and neck ([place])  07/16/2022 Initial Diagnosis   Squamous cell carcinoma of head and neck ([facility])   07/23/2022 -  Chemotherapy   Patient is on Treatment Plan : Carboplatin  + Paclitaxel  + Pembrolizumab (200) D1 q21d       CANCER STAGING:  Cancer Staging  Squamous cell carcinoma of head and neck ([place]) Staging form: Cervical Lymph Nodes and Unknown Primary Tumors of the Head and Neck, AJCC 8th Edition - Clinical stage from 07/16/2022: Stage IVC (cT0, cN3b, cM1) - Unsigned    INTERVAL HISTORY:  [name] 89 y.o. male seen for follow-up of squamous cell carcinoma and toxicity assessment prior to next cycle of chemotherapy.  We have dose reduced Taxol last cycle.  He reported improvement in neuropathy.  He does not have any neuropathy at this time.  He felt weak for couple of days.  He had nausea and diarrhea for 1 day.  He had occasional dizzy episodes.  He did not have any leg pains after last cycle.  Energy levels are 25%.    REVIEW OF SYSTEMS:  Review of Systems  Respiratory:  Positive for cough and shortness of breath.   Gastrointestinal:  Positive for diarrhea and nausea.  Neurological:  Positive for dizziness and numbness (Improved.).  All other systems reviewed and are negative.    PAST MEDICAL/SURGICAL HISTORY:  Past Medical History:  Diagnosis Date   Anemia    Atrial fibrillation (HCC)    Cataract    Cellulitis    In past   Complication of anesthesia     HARD TIME WAKING; CAUSES MY BP TO GO UP    Coronary artery disease    5 bypasses   DJD (degenerative joint disease)    Ejection fraction < 50%    Mildly reduced, 40% by echo   GERD (gastroesophageal reflux disease)    Hyperlipidemia    Hypertension    Persistent atrial fibrillation ([facility])    Prostate hypertrophy    on CT scan 09/2014   PUD (peptic ulcer disease)    RESOLVED    Skin cancer of eyelid    Resected   Past Surgical History:  Procedure Laterality Date   APPENDECTOMY     BYPASS GRAFT  2005   CATARACT EXTRACTION W/PHACO Left 07/22/2015   Procedure: CATARACT EXTRACTION PHACO AND INTRAOCULAR LENS PLACEMENT LEFT EYE CDE=8.14;  Surgeon: Tonny Branch, MD;  Location: [facility];  Service: Ophthalmology;  Laterality: Left;   CATARACT EXTRACTION W/PHACO Right 08/18/2019   Procedure: CATARACT EXTRACTION PHACO AND INTRAOCULAR LENS PLACEMENT (IOC);  Surgeon: Baruch Goldmann, MD;  Location: [facility];  Service: Ophthalmology;  Laterality: Right;  CDE: 9.74   CORONARY ARTERY BYPASS GRAFT  4/05   LIMA to LAD, SVG to PDA, SVG to ramus intermediate   EYE SURGERY  07/2015   EYELID CARCINOMA EXCISION     Skin cancer resected   Heart bypass  2005   LYMPH NODE BIOPSY Right 06/09/2022   Procedure: LYMPH NODE BIOPSY; supraclavicular;  Surgeon: Virl Cagey,  MD;  Location: [facility];  Service: General;  Laterality: Right;   PORTACATH PLACEMENT Left 06/09/2022   Procedure: INSERTION PORT-A-CATH;  Surgeon: Virl Cagey, MD;  Location: [facility];  Service: General;  Laterality: Left;   TOTAL HIP ARTHROPLASTY     Right   TOTAL HIP ARTHROPLASTY Left 05/04/2018   Procedure: LEFT TOTAL HIP ARTHROPLASTY;  Surgeon: [name], MD;  Location: [facility];  Service: Orthopedics;  Laterality: Left;   TOTAL KNEE ARTHROPLASTY     Right     SOCIAL HISTORY:  Social History   Socioeconomic History   Marital status: Married    Spouse name: [name]   Number of children: 1   Years of education: 10   Highest  education level: 10th grade  Occupational History   Occupation: retired  Tobacco Use   Smoking status: Former    Packs/day: 2.00    Years: 5.00    Total pack years: 10.00    Types: Cigarettes    Quit date: 12/23/1963    Years since quitting: 58.9   Smokeless tobacco: Never  Vaping Use   Vaping Use: Never used  Substance and Sexual Activity   Alcohol use: No   Drug use: No   Sexual activity: Yes  Other Topics Concern   Not on file  Social History Narrative   Lives in a split level home.   Social Determinants of Health   Financial Resource Strain: Low Risk  (12/11/2021)   Overall Financial Resource Strain (CARDIA)    Difficulty of Paying Living Expenses: Not hard at all  Food Insecurity: No Food Insecurity (12/11/2021)   Hunger Vital Sign    Worried About Running Out of Food in the Last Year: Never true    Ran Out of Food in the Last Year: Never true  Transportation Needs: No Transportation Needs (12/11/2021)   PRAPARE - Hydrologist (Medical): No    Lack of Transportation (Non-Medical): No  Physical Activity: Insufficiently Active (12/11/2021)   Exercise Vital Sign    Days of Exercise per Week: 7 days    Minutes of Exercise per Session: 20 min  Stress: No Stress Concern Present (12/11/2021)   [place]    Feeling of Stress : Only a little  Recent Concern: Stress - Stress Concern Present (10/31/2021)   [place]    Feeling of Stress : To some extent  Social Connections: Moderately Isolated (12/11/2021)   Social Connection and Isolation Panel [NHANES]    Frequency of Communication with Friends and Family: More than three times a week    Frequency of Social Gatherings with Friends and Family: More than three times a week    Attends Religious Services: Never    Marine scientist or Organizations: No    Attends Theatre manager Meetings: Never    Marital Status: Married  Human resources officer Violence: Not At Risk (12/11/2021)   Humiliation, Afraid, Rape, and Kick questionnaire    Fear of Current or Ex-Partner: No    Emotionally Abused: No    Physically Abused: No    Sexually Abused: No    FAMILY HISTORY:  Family History  Problem Relation Age of Onset   Stroke Mother    Stroke Father    Hypertension Father    Early death Brother        5 months old   Cancer Brother  lung   Stroke Brother        heat    CURRENT MEDICATIONS:  Outpatient Encounter Medications as of 11/09/2022  Medication Sig   acetaminophen (TYLENOL) 500 MG tablet Take 1,000 mg by mouth every 6 (six) hours as needed for moderate pain.   CARBOPLATIN IV Inject into the vein every 21 ( twenty-one) days.   cetirizine-pseudoephedrine (ZYRTEC-D) 5-120 MG tablet Take once per day as need for congestion/runny nose   Cholecalciferol (VITAMIN D3) 5000 units CAPS Take 5,000 Units by mouth once a week.    diltiazem (CARDIZEM CD) 120 MG 24 hr capsule Take one capsule daily   ezetimibe (ZETIA) 10 MG tablet Take 1 tablet (10 mg total) by mouth daily.   finasteride (PROSCAR) 5 MG tablet TAKE ONE (1) TABLET EACH DAY   fluticasone (FLONASE) 50 MCG/ACT nasal spray Place 1 spray into both nostrils daily as needed for allergies or rhinitis.   furosemide (LASIX) 20 MG tablet Take 1 tablet (20 mg total) by mouth daily.   lidocaine-prilocaine (EMLA) cream Apply a small amount to port a cath site and cover with plastic wrap 1 hour prior to chemotherapy appointments   lovastatin (MEVACOR) 20 MG tablet TAKE 1/2 TABLET EVERY OTHER DAY   metoprolol tartrate (LOPRESSOR) 100 MG tablet TAKE ONE (1) TABLET BY MOUTH TWO (2) TIMES DAILY   montelukast (SINGULAIR) 10 MG tablet TAKE ONE TABLET BY MOUTH AT BEDTIME   PACLITAXEL IV Inject into the vein every 21 ( twenty-one) days.   PEMBROLIZUMAB IV Inject into the vein every 21 ( twenty-one) days.    prochlorperazine (COMPAZINE) 10 MG tablet Take 1 tablet (10 mg total) by mouth every 6 (six) hours as needed for nausea or vomiting.   tobramycin (TOBREX) 0.3 % ophthalmic solution    warfarin (COUMADIN) 5 MG tablet TAKE 1 TABLET ON SAT, SUN, TUES, & THURSTAKE 1/2 TABLET ON MON, WED, & FRIDAY   No facility-administered encounter medications on file as of 11/09/2022.    ALLERGIES:  Allergies  Allergen Reactions   Penicillins Hives and Rash    Has patient had a PCN reaction causing immediate rash, facial/tongue/throat swelling, SOB or lightheadedness with hypotension: Yes Has patient had a PCN reaction causing severe rash involving mucus membranes or skin necrosis: Yes Has patient had a PCN reaction that required hospitalization: No Has patient had a PCN reaction occurring within the last 10 years: No If all of the above answers are "NO", then may proceed with Cephalosporin use.    Hct [Hydrochlorothiazide] Other (See Comments)    hyper   Statins Other (See Comments)    Myalgia, tolerates low dosages of lovastatin      PHYSICAL EXAM:  ECOG Performance status: 1  Vitals:   11/09/22 0903  BP: 129/73  Pulse: 72  Resp: 18  Temp: (!) 96.2 F (35.7 C)  SpO2: 98%    Filed Weights   11/09/22 0903  Weight: 184 lb 8 oz (83.7 kg)    Physical Exam Vitals reviewed.  Constitutional:      Appearance: Normal appearance.  Cardiovascular:     Rate and Rhythm: Regular rhythm.     Heart sounds: Normal heart sounds.  Pulmonary:     Breath sounds: Normal breath sounds.  Neurological:     Mental Status: He is alert.  Psychiatric:        Mood and Affect: Mood normal.        Behavior: Behavior normal.    Neck and right axillary lymph  nodes palpable.  LABORATORY DATA:  I have reviewed the labs as listed.  CBC    Component Value Date/Time   WBC 9.8 11/09/2022 0814   RBC 3.36 (L) 11/09/2022 0814   HGB 11.6 (L) 11/09/2022 0814   HGB 13.3 05/22/2022 0955   HCT 36.7 (L) 11/09/2022  0814   HCT 39.4 05/22/2022 0955   PLT 234 11/09/2022 0814   PLT 372 05/22/2022 0955   MCV 109.2 (H) 11/09/2022 0814   MCV 98 (H) 05/22/2022 0955   MCH 34.5 (H) 11/09/2022 0814   MCHC 31.6 11/09/2022 0814   RDW 14.9 11/09/2022 0814   RDW 11.6 05/22/2022 0955   LYMPHSABS 2.6 11/09/2022 0814   LYMPHSABS 5.1 (H) 05/22/2022 0955   MONOABS 0.8 11/09/2022 0814   EOSABS 0.2 11/09/2022 0814   EOSABS 0.3 05/22/2022 0955   BASOSABS 0.1 11/09/2022 0814   BASOSABS 0.1 05/22/2022 0955      Latest Ref Rng & Units 11/09/2022    8:14 AM 10/19/2022    8:01 AM 09/28/2022    8:21 AM  CMP  Glucose 70 - 99 mg/dL 118  130  104   BUN 8 - 23 mg/dL 17  20  16    Creatinine 0.61 - 1.24 mg/dL 0.92  0.96  0.93   Sodium 135 - 145 mmol/L 139  137  141   Potassium 3.5 - 5.1 mmol/L 3.7  3.9  4.1   Chloride 98 - 111 mmol/L 105  104  108   CO2 22 - 32 mmol/L 27  24  27    Calcium 8.9 - 10.3 mg/dL 8.7  8.8  8.8   Total Protein 6.5 - 8.1 g/dL 6.4  6.5  6.4   Total Bilirubin 0.3 - 1.2 mg/dL 0.5  0.6  0.6   Alkaline Phos 38 - 126 U/L 87  79  82   AST 15 - 41 U/L 19  16  17    ALT 0 - 44 U/L 13  14  15      DIAGNOSTIC IMAGING:  I have independently reviewed the scans and discussed with the patient.  ASSESSMENT: 1.  Right supraclavicular lymphadenopathy and multiple lung nodules: - Patient noticed right neck mass since Christmas 2022.  10 to 15 pound weight loss since January 2023.  Denies any dysphagia or odynophagia. - CT neck (05/27/2022): Numerous lymph nodes in the right neck, right supraclavicular lymph node mass measures 5.7 x 3.7 cm.  18 mm posterior lymph node in the right lower neck.  Numerous lymph nodes in the right lower and posterior neck approximately 1 cm.  Many have internal necrosis consistent with metastatic disease.  20 mm lymph node just above the right clavicle.  Left level 2 node 12 mm. - CT CAP (05/28/2022): Several bilateral lung nodules, RUL nodule measuring 1.8 x 1.3 cm.  Small clusters of  nodules in the medial left upper lobe.  Irregular nodular density along the left side of the mediastinum measuring 2.1 cm.  No metastatic disease in the abdomen or pelvis.  Liver is slightly nodular contour. - Lab work shows elevated white count since 2009, predominantly lymphocytosis. - Pathology (06/09/2022): Metastatic carcinoma positive for p40, p63 and CK5/6-patchy positivity with CK7.  Negative for TTF-1, Napsin A, PAX8, prostein, PSA, CK20 and CDX2.  - PET scan (06/11/2022): Bulky right supraclavicular nodal mass, smaller hypermetabolic right posterior triangle and jugular lymph nodes and single hypermetabolic left level 3 lymph node.  Bilateral hypermetabolic pulmonary nodules and metastatic pattern.  Minimal hypermetabolic  mediastinal nodal metastasis.  Cluster of hypermetabolic right axillary lymph nodes.  No evidence of metastatic disease or primary lesion in the abdomen or pelvis.  No bone lesions. - MRI of the brain: Chronic small vessel ischemic changes with no evidence of metastatic disease. - NGS: T p53 pathogenic variant, MSI-stable, TMB-low, LOH-low, PD-L1 (HQ469): Negative, p16 and p18 negative - Cancer type ID: 96% probability squamous cell carcinoma, subtype head and neck/skin.  Cancer types ruled out with 95% confidence includes skin basal cell carcinoma. - Cycle 1 of carboplatin, Taxol and pembrolizumab on 07/23/2022  2.  Social/family history: - He lives at home with his wife.  Independent of ADLs and IADLs.  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  May have had asbestos exposure 30 years ago.  Quit smoking cigarettes in 1965.  Smoked 1 pack/day for less than 12 years.  2 of his brothers had lung cancer and both were smokers.   PLAN:  1.  Metastatic squamous cell carcinoma, presumed head and neck primary: - He has tolerated last cycle with 2 days of weakness, 1 day of nausea and 1 day of diarrhea. - Reviewed labs today which showed  normal LFTs and creatinine.  CBC was grossly normal.  TSH is 1.1. - Proceed with cycle 6 today.  RTC 3 weeks for follow-up. - In 3 weeks he will start maintenance immunotherapy.  I plan to repeat CT neck and CT CAP in 6 weeks.  2.  Neuropathy: - He does not have any neuropathy at this time.  This has completely resolved after paclitaxel dose reduced to 80 mg/m.  Will continue the same dose.  Orders placed this encounter:  Orders Placed This Encounter  Procedures   CT CHEST ABDOMEN PELVIS W CONTRAST   CT SOFT TISSUE NECK W CONTRAST       Derek Jack, [place] [phone]

## 2022-11-11 ENCOUNTER — Inpatient Hospital Stay: Payer: Medicare Other

## 2022-11-11 VITALS — BP 135/80 | HR 78 | Temp 97.5°F | Resp 18

## 2022-11-11 DIAGNOSIS — Z5111 Encounter for antineoplastic chemotherapy: Secondary | ICD-10-CM | POA: Diagnosis not present

## 2022-11-11 DIAGNOSIS — I10 Essential (primary) hypertension: Secondary | ICD-10-CM | POA: Diagnosis not present

## 2022-11-11 DIAGNOSIS — G629 Polyneuropathy, unspecified: Secondary | ICD-10-CM | POA: Diagnosis not present

## 2022-11-11 DIAGNOSIS — Z5189 Encounter for other specified aftercare: Secondary | ICD-10-CM | POA: Diagnosis not present

## 2022-11-11 DIAGNOSIS — C760 Malignant neoplasm of head, face and neck: Secondary | ICD-10-CM

## 2022-11-11 DIAGNOSIS — Z87891 Personal history of nicotine dependence: Secondary | ICD-10-CM | POA: Diagnosis not present

## 2022-11-11 DIAGNOSIS — Z95828 Presence of other vascular implants and grafts: Secondary | ICD-10-CM

## 2022-11-11 DIAGNOSIS — Z5112 Encounter for antineoplastic immunotherapy: Secondary | ICD-10-CM | POA: Diagnosis not present

## 2022-11-11 DIAGNOSIS — Z79899 Other long term (current) drug therapy: Secondary | ICD-10-CM | POA: Diagnosis not present

## 2022-11-11 DIAGNOSIS — R918 Other nonspecific abnormal finding of lung field: Secondary | ICD-10-CM | POA: Diagnosis not present

## 2022-11-11 DIAGNOSIS — C771 Secondary and unspecified malignant neoplasm of intrathoracic lymph nodes: Secondary | ICD-10-CM | POA: Diagnosis not present

## 2022-11-11 DIAGNOSIS — Z801 Family history of malignant neoplasm of trachea, bronchus and lung: Secondary | ICD-10-CM | POA: Diagnosis not present

## 2022-11-11 MED ORDER — PEGFILGRASTIM-CBQV 6 MG/0.6ML ~~LOC~~ SOSY
6.0000 mg | PREFILLED_SYRINGE | Freq: Once | SUBCUTANEOUS | Status: AC
  Administered 2022-11-11: 6 mg via SUBCUTANEOUS
  Filled 2022-11-11: qty 0.6

## 2022-11-11 NOTE — Progress Notes (Signed)
Patient tolerated injection with no complaints voiced. Site clean and dry with no bruising or swelling noted at site. See MAR for details. Band aid applied.  Patient stable during and after injection. VSS with discharge and left in satisfactory condition with no s/s of distress noted.  

## 2022-11-11 NOTE — Patient Instructions (Signed)
[facility]  Discharge Instructions: Thank you for choosing [facility] to provide your oncology and hematology care.  If you have a lab appointment with the [facility], please come in thru the Main Entrance and check in at the main information desk.  Wear comfortable clothing and clothing appropriate for easy access to any Portacath or PICC line.   We strive to give you quality time with your provider. You may need to reschedule your appointment if you arrive late (15 or more minutes).  Arriving late affects you and other patients whose appointments are after yours.  Also, if you miss three or more appointments without notifying the office, you may be dismissed from the clinic at the provider's discretion.      For prescription refill requests, have your pharmacy contact our office and allow 72 hours for refills to be completed.    Today you received the following udenyca, return as scheduled.   To help prevent nausea and vomiting after your treatment, we encourage you to take your nausea medication as directed.  BELOW ARE SYMPTOMS THAT SHOULD BE REPORTED IMMEDIATELY: *FEVER GREATER THAN 100.4 F (38 C) OR HIGHER *CHILLS OR SWEATING *NAUSEA AND VOMITING THAT IS NOT CONTROLLED WITH YOUR NAUSEA MEDICATION *UNUSUAL SHORTNESS OF BREATH *UNUSUAL BRUISING OR BLEEDING *URINARY PROBLEMS (pain or burning when urinating, or frequent urination) *BOWEL PROBLEMS (unusual diarrhea, constipation, pain near the anus) TENDERNESS IN MOUTH AND THROAT WITH OR WITHOUT PRESENCE OF ULCERS (sore throat, sores in mouth, or a toothache) UNUSUAL RASH, SWELLING OR PAIN  UNUSUAL VAGINAL DISCHARGE OR ITCHING   Items with * indicate a potential emergency and should be followed up as soon as possible or go to the Emergency Department if any problems should occur.  Please show the CHEMOTHERAPY ALERT CARD or IMMUNOTHERAPY ALERT CARD at check-in to the Emergency Department and triage  nurse.  Should you have questions after your visit or need to cancel or reschedule your appointment, please contact [facility] [phone]  and follow the prompts.  Office hours are 8:00 a.m. to 4:30 p.m. Monday - Friday. Please note that voicemails left after 4:00 p.m. may not be returned until the following business day.  We are closed weekends and major holidays. You have access to a nurse at all times for urgent questions. Please call the main number to the clinic [phone] and follow the prompts.  For any non-urgent questions, you may also contact your provider using MyChart. We now offer e-Visits for anyone 80 and older to request care online for non-urgent symptoms. For details visit [email or web address].   Also download the MyChart app! Go to the app store, search "MyChart", open the app, select , and log in with your MyChart username and password.

## 2022-11-19 ENCOUNTER — Encounter: Payer: Self-pay | Admitting: *Deleted

## 2022-11-20 ENCOUNTER — Other Ambulatory Visit: Payer: Self-pay

## 2022-11-23 ENCOUNTER — Ambulatory Visit (HOSPITAL_COMMUNITY): Payer: Medicare Other

## 2022-11-27 ENCOUNTER — Ambulatory Visit (INDEPENDENT_AMBULATORY_CARE_PROVIDER_SITE_OTHER): Payer: Medicare Other | Admitting: Family Medicine

## 2022-11-27 ENCOUNTER — Encounter: Payer: Self-pay | Admitting: Family Medicine

## 2022-11-27 VITALS — BP 141/79 | HR 79 | Temp 98.1°F | Ht 66.0 in | Wt 186.0 lb

## 2022-11-27 DIAGNOSIS — I4819 Other persistent atrial fibrillation: Secondary | ICD-10-CM | POA: Diagnosis not present

## 2022-11-27 DIAGNOSIS — Z7901 Long term (current) use of anticoagulants: Secondary | ICD-10-CM

## 2022-11-27 LAB — COAGUCHEK XS/INR WAIVED
INR: 1.8 — ABNORMAL HIGH (ref 0.9–1.1)
Prothrombin Time: 21.6 s

## 2022-11-27 MED ORDER — LOVASTATIN 20 MG PO TABS: ORAL_TABLET | ORAL | 3 refills | Status: DC

## 2022-11-27 MED ORDER — FUROSEMIDE 20 MG PO TABS: 20.0000 mg | ORAL_TABLET | Freq: Every day | ORAL | 3 refills | Status: DC

## 2022-11-27 MED ORDER — MONTELUKAST SODIUM 10 MG PO TABS: 10.0000 mg | ORAL_TABLET | Freq: Every day | ORAL | 3 refills | Status: DC

## 2022-11-27 MED ORDER — FINASTERIDE 5 MG PO TABS: ORAL_TABLET | ORAL | 3 refills | Status: DC

## 2022-11-27 NOTE — Progress Notes (Signed)
BP (!) 141/79   Pulse 79   Temp 98.1 F (36.7 C)   Ht 5\' 6"  (1.676 m)   Wt 186 lb (84.4 kg)   SpO2 98%   BMI 30.02 kg/m    Subjective:   Patient ID: [name], male    DOB: 05-May-1932, 89 y.o.   MRN: [id number]  HPI: [name] is a 89 y.o. male presenting on 11/27/2022 for Medical Management of Chronic Issues and Atrial Fibrillation   HPI Coumadin recheck Target goal: 2.0-3.0 Reason on anticoagulation: Chronic A-fib Patient denies any bruising or bleeding or chest pain or palpitations   Relevant past medical, surgical, family and social history reviewed and updated as indicated. Interim medical history since our last visit reviewed. Allergies and medications reviewed and updated.  Review of Systems  Constitutional:  Negative for chills and fever.  Respiratory:  Negative for shortness of breath and wheezing.   Cardiovascular:  Negative for chest pain and leg swelling.  Musculoskeletal:  Negative for back pain and gait problem.  Skin:  Negative for color change and rash.  All other systems reviewed and are negative.   Per HPI unless specifically indicated above   Allergies as of 11/27/2022       Reactions   Penicillins Hives, Rash   Has patient had a PCN reaction causing immediate rash, facial/tongue/throat swelling, SOB or lightheadedness with hypotension: Yes Has patient had a PCN reaction causing severe rash involving mucus membranes or skin necrosis: Yes Has patient had a PCN reaction that required hospitalization: No Has patient had a PCN reaction occurring within the last 10 years: No If all of the above answers are "NO", then may proceed with Cephalosporin use.   Hct [hydrochlorothiazide] Other (See Comments)   hyper   Statins Other (See Comments)   Myalgia, tolerates low dosages of lovastatin         Medication List        Accurate as of November 27, 2022 10:05 AM. If you have any questions, ask your nurse or doctor.           acetaminophen 500 MG tablet Commonly known as: TYLENOL Take 1,000 mg by mouth every 6 (six) hours as needed for moderate pain.   CARBOPLATIN IV Inject into the vein every 21 ( twenty-one) days.   cetirizine-pseudoephedrine 5-120 MG tablet Commonly known as: ZYRTEC-D Take once per day as need for congestion/runny nose   diltiazem 120 MG 24 hr capsule Commonly known as: CARDIZEM CD Take one capsule daily   ezetimibe 10 MG tablet Commonly known as: ZETIA Take 1 tablet (10 mg total) by mouth daily.   finasteride 5 MG tablet Commonly known as: PROSCAR TAKE ONE (1) TABLET EACH DAY   fluticasone 50 MCG/ACT nasal spray Commonly known as: FLONASE Place 1 spray into both nostrils daily as needed for allergies or rhinitis.   furosemide 20 MG tablet Commonly known as: LASIX Take 1 tablet (20 mg total) by mouth daily.   lidocaine-prilocaine cream Commonly known as: EMLA Apply a small amount to port a cath site and cover with plastic wrap 1 hour prior to chemotherapy appointments   lovastatin 20 MG tablet Commonly known as: MEVACOR TAKE 1/2 TABLET EVERY OTHER DAY   metoprolol tartrate 100 MG tablet Commonly known as: LOPRESSOR TAKE ONE (1) TABLET BY MOUTH TWO (2) TIMES DAILY   montelukast 10 MG tablet Commonly known as: SINGULAIR TAKE ONE TABLET BY MOUTH AT BEDTIME   PACLITAXEL IV Inject into  the vein every 21 ( twenty-one) days.   PEMBROLIZUMAB IV Inject into the vein every 21 ( twenty-one) days.   prochlorperazine 10 MG tablet Commonly known as: COMPAZINE Take 1 tablet (10 mg total) by mouth every 6 (six) hours as needed for nausea or vomiting.   tobramycin 0.3 % ophthalmic solution Commonly known as: TOBREX   Vitamin D3 125 MCG (5000 UT) capsule Generic drug: Cholecalciferol Take 5,000 Units by mouth once a week.   warfarin 5 MG tablet Commonly known as: COUMADIN Take as directed by the anticoagulation clinic. If you are unsure how to take this medication,  talk to your nurse or doctor. Original instructions: TAKE 1 TABLET ON SAT, SUN, TUES, & THURSTAKE 1/2 TABLET ON MON, WED, & FRIDAY         Objective:   BP (!) 141/79   Pulse 79   Temp 98.1 F (36.7 C)   Ht 5\' 6"  (1.676 m)   Wt 186 lb (84.4 kg)   SpO2 98%   BMI 30.02 kg/m   Wt Readings from Last 3 Encounters:  11/27/22 186 lb (84.4 kg)  11/09/22 184 lb 8 oz (83.7 kg)  10/19/22 187 lb 6.4 oz (85 kg)    Physical Exam Vitals and nursing note reviewed.  Constitutional:      General: He is not in acute distress.    Appearance: He is well-developed. He is not diaphoretic.  Eyes:     General: No scleral icterus.    Conjunctiva/sclera: Conjunctivae normal.  Neck:     Thyroid: No thyromegaly.  Cardiovascular:     Rate and Rhythm: Normal rate. Rhythm irregular.     Heart sounds: Normal heart sounds. No murmur heard. Pulmonary:     Effort: Pulmonary effort is normal. No respiratory distress.     Breath sounds: Normal breath sounds. No wheezing.  Musculoskeletal:        General: Normal range of motion.     Cervical back: Neck supple.  Lymphadenopathy:     Cervical: No cervical adenopathy.  Skin:    General: Skin is warm and dry.     Findings: No rash.  Neurological:     Mental Status: He is alert and oriented to person, place, and time.     Coordination: Coordination normal.  Psychiatric:        Behavior: Behavior normal.       Assessment & Plan:   Problem List Items Addressed This Visit       Cardiovascular and Mediastinum   Atrial fibrillation, persistent ([place]) - Primary   Relevant Orders   CoaguChek XS/INR Waived     Other   Long term current use of anticoagulant therapy    Patient says the chemotherapy is really weighing on him but he is not having nausea the point that he can eat and he is trying to keep up his nutrition.  He says he is doing okay but just wears on a lot. Follow up plan: Return if symptoms worsen or fail to improve, for 4 to 6-week INR  recheck..  Counseling provided for all of the vaccine components Orders Placed This Encounter  Procedures   CoaguChek XS/INR [place] Tyera Hansley, MD [facility] 11/27/2022, 10:05 AM

## 2022-11-30 ENCOUNTER — Inpatient Hospital Stay (HOSPITAL_BASED_OUTPATIENT_CLINIC_OR_DEPARTMENT_OTHER): Payer: Medicare Other | Admitting: Hematology

## 2022-11-30 ENCOUNTER — Inpatient Hospital Stay: Payer: Medicare Other

## 2022-11-30 ENCOUNTER — Ambulatory Visit: Payer: Medicare Other | Admitting: Hematology

## 2022-11-30 ENCOUNTER — Other Ambulatory Visit: Payer: Medicare Other

## 2022-11-30 ENCOUNTER — Ambulatory Visit: Payer: Medicare Other

## 2022-11-30 ENCOUNTER — Inpatient Hospital Stay: Payer: Medicare Other | Attending: Hematology

## 2022-11-30 VITALS — BP 154/84 | HR 74 | Temp 97.7°F | Resp 18

## 2022-11-30 DIAGNOSIS — C760 Malignant neoplasm of head, face and neck: Secondary | ICD-10-CM

## 2022-11-30 DIAGNOSIS — C7801 Secondary malignant neoplasm of right lung: Secondary | ICD-10-CM | POA: Diagnosis not present

## 2022-11-30 DIAGNOSIS — Z95828 Presence of other vascular implants and grafts: Secondary | ICD-10-CM

## 2022-11-30 DIAGNOSIS — C7802 Secondary malignant neoplasm of left lung: Secondary | ICD-10-CM | POA: Insufficient documentation

## 2022-11-30 DIAGNOSIS — Z5112 Encounter for antineoplastic immunotherapy: Secondary | ICD-10-CM | POA: Diagnosis not present

## 2022-11-30 LAB — CBC WITH DIFFERENTIAL/PLATELET
Abs Immature Granulocytes: 0.07 10*3/uL (ref 0.00–0.07)
Basophils Absolute: 0.1 10*3/uL (ref 0.0–0.1)
Basophils Relative: 1 %
Eosinophils Absolute: 0.3 10*3/uL (ref 0.0–0.5)
Eosinophils Relative: 2 %
HCT: 36.4 % — ABNORMAL LOW (ref 39.0–52.0)
Hemoglobin: 11.7 g/dL — ABNORMAL LOW (ref 13.0–17.0)
Immature Granulocytes: 1 %
Lymphocytes Relative: 28 %
Lymphs Abs: 3.5 10*3/uL (ref 0.7–4.0)
MCH: 35.3 pg — ABNORMAL HIGH (ref 26.0–34.0)
MCHC: 32.1 g/dL (ref 30.0–36.0)
MCV: 110 fL — ABNORMAL HIGH (ref 80.0–100.0)
Monocytes Absolute: 1 10*3/uL (ref 0.1–1.0)
Monocytes Relative: 8 %
Neutro Abs: 7.6 10*3/uL (ref 1.7–7.7)
Neutrophils Relative %: 60 %
Platelets: 281 10*3/uL (ref 150–400)
RBC: 3.31 MIL/uL — ABNORMAL LOW (ref 4.22–5.81)
RDW: 14.4 % (ref 11.5–15.5)
WBC: 12.5 10*3/uL — ABNORMAL HIGH (ref 4.0–10.5)
nRBC: 0 % (ref 0.0–0.2)

## 2022-11-30 LAB — COMPREHENSIVE METABOLIC PANEL
ALT: 14 U/L (ref 0–44)
AST: 19 U/L (ref 15–41)
Albumin: 3.6 g/dL (ref 3.5–5.0)
Alkaline Phosphatase: 81 U/L (ref 38–126)
Anion gap: 6 (ref 5–15)
BUN: 14 mg/dL (ref 8–23)
CO2: 28 mmol/L (ref 22–32)
Calcium: 8.7 mg/dL — ABNORMAL LOW (ref 8.9–10.3)
Chloride: 106 mmol/L (ref 98–111)
Creatinine, Ser: 0.87 mg/dL (ref 0.61–1.24)
GFR, Estimated: >60 mL/min (ref >60)
Glucose, Bld: 96 mg/dL (ref 70–99)
Potassium: 4 mmol/L (ref 3.5–5.1)
Sodium: 140 mmol/L (ref 135–145)
Total Bilirubin: 0.5 mg/dL (ref 0.3–1.2)
Total Protein: 6.3 g/dL — ABNORMAL LOW (ref 6.5–8.1)

## 2022-11-30 LAB — MAGNESIUM: Magnesium: 2.1 mg/dL (ref 1.7–2.4)

## 2022-11-30 MED ORDER — SODIUM CHLORIDE 0.9 % IV SOLN: Freq: Once | INTRAVENOUS | Status: AC

## 2022-11-30 MED ORDER — HEPARIN SOD (PORK) LOCK FLUSH 100 UNIT/ML IV SOLN
500.0000 [IU] | Freq: Once | INTRAVENOUS | Status: AC | PRN
  Administered 2022-11-30: 500 [IU]

## 2022-11-30 MED ORDER — SODIUM CHLORIDE 0.9 % IV SOLN
200.0000 mg | Freq: Once | INTRAVENOUS | Status: AC
  Administered 2022-11-30: 200 mg via INTRAVENOUS
  Filled 2022-11-30: qty 8

## 2022-11-30 MED ORDER — SODIUM CHLORIDE 0.9% FLUSH
10.0000 mL | INTRAVENOUS | Status: DC | PRN
  Administered 2022-11-30: 10 mL

## 2022-11-30 MED ORDER — SODIUM CHLORIDE 0.9% FLUSH
10.0000 mL | Freq: Once | INTRAVENOUS | Status: AC
  Administered 2022-11-30: 10 mL via INTRAVENOUS

## 2022-11-30 NOTE — Patient Instructions (Signed)
[facility]  Discharge Instructions: Thank you for choosing [facility] to provide your oncology and hematology care.  If you have a lab appointment with the [facility], please come in thru the Main Entrance and check in at the main information desk.  Wear comfortable clothing and clothing appropriate for easy access to any Portacath or PICC line.   We strive to give you quality time with your provider. You may need to reschedule your appointment if you arrive late (15 or more minutes).  Arriving late affects you and other patients whose appointments are after yours.  Also, if you miss three or more appointments without notifying the office, you may be dismissed from the clinic at the provider's discretion.      For prescription refill requests, have your pharmacy contact our office and allow 72 hours for refills to be completed.    Today you received Keytruda  Pembrolizumab Injection What is this medication? PEMBROLIZUMAB (PEM broe LIZ ue mab) treats some types of cancer. It works by helping your immune system slow or stop the spread of cancer cells. It is a monoclonal antibody. This medicine may be used for other purposes; ask your health care provider or pharmacist if you have questions. COMMON BRAND NAME(S): Keytruda What should I tell my care team before I take this medication? They need to know if you have any of these conditions: Allogeneic stem cell transplant (uses someone else's stem cells) Autoimmune diseases, such as Crohn disease, ulcerative colitis, lupus History of chest radiation Nervous system problems, such as Guillain-Barre syndrome, myasthenia gravis Organ transplant An unusual or allergic reaction to pembrolizumab, other medications, foods, dyes, or preservatives Pregnant or trying to get pregnant Breast-feeding How should I use this medication? This medication is injected into a vein. It is given by your care team in a hospital  or clinic setting. A special MedGuide will be given to you before each treatment. Be sure to read this information carefully each time. Talk to your care team about the use of this medication in children. While it may be prescribed for children as young as 6 months for selected conditions, precautions do apply. Overdosage: If you think you have taken too much of this medicine contact a poison control center or emergency room at once. NOTE: This medicine is only for you. Do not share this medicine with others. What if I miss a dose? Keep appointments for follow-up doses. It is important not to miss your dose. Call your care team if you are unable to keep an appointment. What may interact with this medication? Interactions have not been studied. This list may not describe all possible interactions. Give your health care provider a list of all the medicines, herbs, non-prescription drugs, or dietary supplements you use. Also tell them if you smoke, drink alcohol, or use illegal drugs. Some items may interact with your medicine. What should I watch for while using this medication? Your condition will be monitored carefully while you are receiving this medication. You may need blood work while taking this medication. This medication may cause serious skin reactions. They can happen weeks to months after starting the medication. Contact your care team right away if you notice fevers or flu-like symptoms with a rash. The rash may be red or purple and then turn into blisters or peeling of the skin. You may also notice a red rash with swelling of the face, lips, or lymph nodes in your neck or under  your arms. Tell your care team right away if you have any change in your eyesight. Talk to your care team if you may be pregnant. Serious birth defects can occur if you take this medication during pregnancy and for 4 months after the last dose. You will need a negative pregnancy test before starting this medication.  Contraception is recommended while taking this medication and for 4 months after the last dose. Your care team can help you find the option that works for you. Do not breastfeed while taking this medication and for 4 months after the last dose. What side effects may I notice from receiving this medication? Side effects that you should report to your care team as soon as possible: Allergic reactions--skin rash, itching, hives, swelling of the face, lips, tongue, or throat Dry cough, shortness of breath or trouble breathing Eye pain, redness, irritation, or discharge with blurry or decreased vision Heart muscle inflammation--unusual weakness or fatigue, shortness of breath, chest pain, fast or irregular heartbeat, dizziness, swelling of the ankles, feet, or hands Hormone gland problems--headache, sensitivity to light, unusual weakness or fatigue, dizziness, fast or irregular heartbeat, increased sensitivity to cold or heat, excessive sweating, constipation, hair loss, increased thirst or amount of urine, tremors or shaking, irritability Infusion reactions--chest pain, shortness of breath or trouble breathing, feeling faint or lightheaded Kidney injury (glomerulonephritis)--decrease in the amount of urine, red or dark brown urine, foamy or bubbly urine, swelling of the ankles, hands, or feet Liver injury--right upper belly pain, loss of appetite, nausea, light-colored stool, dark yellow or brown urine, yellowing skin or eyes, unusual weakness or fatigue Pain, tingling, or numbness in the hands or feet, muscle weakness, change in vision, confusion or trouble speaking, loss of balance or coordination, trouble walking, seizures Rash, fever, and swollen lymph nodes Redness, blistering, peeling, or loosening of the skin, including inside the mouth Sudden or severe stomach pain, bloody diarrhea, fever, nausea, vomiting Side effects that usually do not require medical attention (report to your care team if  they continue or are bothersome): Bone, joint, or muscle pain Diarrhea Fatigue Loss of appetite Nausea Skin rash This list may not describe all possible side effects. Call your doctor for medical advice about side effects. You may report side effects to FDA at 1-800-FDA-1088. Where should I keep my medication? This medication is given in a hospital or clinic. It will not be stored at home. NOTE: This sheet is a summary. It may not cover all possible information. If you have questions about this medicine, talk to your doctor, pharmacist, or health care provider.  2023 Elsevier/Gold Standard (2013-06-19 00:00:00)      BELOW ARE SYMPTOMS THAT SHOULD BE REPORTED IMMEDIATELY: *FEVER GREATER THAN 100.4 F (38 C) OR HIGHER *CHILLS OR SWEATING *NAUSEA AND VOMITING THAT IS NOT CONTROLLED WITH YOUR NAUSEA MEDICATION *UNUSUAL SHORTNESS OF BREATH *UNUSUAL BRUISING OR BLEEDING *URINARY PROBLEMS (pain or burning when urinating, or frequent urination) *BOWEL PROBLEMS (unusual diarrhea, constipation, pain near the anus) TENDERNESS IN MOUTH AND THROAT WITH OR WITHOUT PRESENCE OF ULCERS (sore throat, sores in mouth, or a toothache) UNUSUAL RASH, SWELLING OR PAIN  UNUSUAL VAGINAL DISCHARGE OR ITCHING   Items with * indicate a potential emergency and should be followed up as soon as possible or go to the Emergency Department if any problems should occur.  Please show the CHEMOTHERAPY ALERT CARD or IMMUNOTHERAPY ALERT CARD at check-in to the Emergency Department and triage nurse.  Should you have questions after your visit or  need to cancel or reschedule your appointment, please contact [facility] [phone]  and follow the prompts.  Office hours are 8:00 a.m. to 4:30 p.m. Monday - Friday. Please note that voicemails left after 4:00 p.m. may not be returned until the following business day.  We are closed weekends and major holidays. You have access to a nurse at all times for  urgent questions. Please call the main number to the clinic [phone] and follow the prompts.  For any non-urgent questions, you may also contact your provider using MyChart. We now offer e-Visits for anyone 50 and older to request care online for non-urgent symptoms. For details visit [email or web address].   Also download the MyChart app! Go to the app store, search "MyChart", open the app, select [facility], and log in with your MyChart username and password.

## 2022-11-30 NOTE — Patient Instructions (Addendum)
[facility] at [facility] Discharge Instructions   You were seen and examined today by Dr. Delton Coombes.  He reviewed the results of your lab work which are normal/stable.   We will proceed with your treatment today. You are finished with the chemotherapy portion of treatment. You will come to the clinic now every 3 weeks to receive Keytruda (immunotherapy) only. This is maintenance therapy you will continue to receive every 3 weeks to keep the cancer under control. You will no longer need the white blood cell booster shot since we are discontinuing the chemotherapy.   Return as scheduled.    Thank you for choosing [facility] at [facility] to provide your oncology and hematology care.  To afford each patient quality time with our provider, please arrive at least 15 minutes before your scheduled appointment time.   If you have a lab appointment with the [facility] please come in thru the Main Entrance and check in at the main information desk.  You need to re-schedule your appointment should you arrive 10 or more minutes late.  We strive to give you quality time with our providers, and arriving late affects you and other patients whose appointments are after yours.  Also, if you no show three or more times for appointments you may be dismissed from the clinic at the providers discretion.     Again, thank you for choosing [facility].  Our hope is that these requests will decrease the amount of time that you wait before being seen by our physicians.       _____________________________________________________________  Should you have questions after your visit to [facility], please contact our office at [phone] and follow the prompts.  Our office hours are 8:00 a.m. and 4:30 p.m. Monday - Friday.  Please note that voicemails left after 4:00 p.m. may not be returned until the following business day.  We are closed  weekends and major holidays.  You do have access to a nurse 24-7, just call the main number to the clinic [phone] and do not press any options, hold on the line and a nurse will answer the phone.    For prescription refill requests, have your pharmacy contact our office and allow 72 hours.    Due to Covid, you will need to wear a mask upon entering the hospital. If you do not have a mask, a mask will be given to you at the Main Entrance upon arrival. For doctor visits, patients may have 1 support person age 87 or older with them. For treatment visits, patients can not have anyone with them due to social distancing guidelines and our immunocompromised population.

## 2022-11-30 NOTE — Progress Notes (Signed)
Patient has been examined by Dr. Katragadda, and vital signs and labs have been reviewed. ANC, Creatinine, LFTs, hemoglobin, and platelets are within treatment parameters per M.D. - pt may proceed with treatment.  Primary RN and pharmacy notified.  

## 2022-11-30 NOTE — Progress Notes (Signed)
Patients port flushed without difficulty.  Good blood return noted with no bruising or swelling noted at site.  Stable during access and blood draw.  Patient to remain accessed for possible treatment.

## 2022-11-30 NOTE — Progress Notes (Signed)
[facility] [address], [place] [address]    Clinic Day:  11/30/2022  Referring physician: [name], Fransisca Kaufmann, MD  Patient Care Team: [name], Fransisca Kaufmann, MD as PCP - General (Family Medicine) Minus Breeding, MD as PCP - Cardiology (Cardiology) Irine Seal, MD as Attending Physician (Urology) Minus Breeding, MD as Consulting Physician (Cardiology) Rogene Houston, MD as Consulting Physician (Gastroenterology) Tonny Branch, MD as Consulting Physician (Ophthalmology) [name], MD as Consulting Physician (Orthopedic Surgery) Shea Evans, Norva Riffle, LCSW as [facility] [employer] (Licensed Clinical Social Worker) Derek Jack, MD as Medical Oncologist ([place]) Brien Mates, RN as Oncology Nurse Navigator (Medical Oncology)   ASSESSMENT & PLAN:   Assessment: 1.  Right supraclavicular lymphadenopathy and multiple lung nodules: - Patient noticed right neck mass since Christmas 2022.  10 to 15 pound weight loss since January 2023.  Denies any dysphagia or odynophagia. - CT neck (05/27/2022): Numerous lymph nodes in the right neck, right supraclavicular lymph node mass measures 5.7 x 3.7 cm.  18 mm posterior lymph node in the right lower neck.  Numerous lymph nodes in the right lower and posterior neck approximately 1 cm.  Many have internal necrosis consistent with metastatic disease.  20 mm lymph node just above the right clavicle.  Left level 2 node 12 mm. - CT CAP (05/28/2022): Several bilateral lung nodules, RUL nodule measuring 1.8 x 1.3 cm.  Small clusters of nodules in the medial left upper lobe.  Irregular nodular density along the left side of the mediastinum measuring 2.1 cm.  No metastatic disease in the abdomen or pelvis.  Liver is slightly nodular contour. - Lab work shows elevated white count since 2009, predominantly lymphocytosis. - Pathology (06/09/2022): Metastatic carcinoma positive for p40, p63 and  CK5/6-patchy positivity with CK7.  Negative for TTF-1, Napsin A, PAX8, prostein, PSA, CK20 and CDX2.  - PET scan (06/11/2022): Bulky right supraclavicular nodal mass, smaller hypermetabolic right posterior triangle and jugular lymph nodes and single hypermetabolic left level 3 lymph node.  Bilateral hypermetabolic pulmonary nodules and metastatic pattern.  Minimal hypermetabolic mediastinal nodal metastasis.  Cluster of hypermetabolic right axillary lymph nodes.  No evidence of metastatic disease or primary lesion in the abdomen or pelvis.  No bone lesions. - MRI of the brain: Chronic small vessel ischemic changes with no evidence of metastatic disease. - NGS: T p53 pathogenic variant, MSI-stable, TMB-low, LOH-low, PD-L1 ([id number]): Negative, p16 and p18 negative - Cancer type ID: 96% probability squamous cell carcinoma, subtype head and neck/skin.  Cancer types ruled out with 95% confidence includes skin basal cell carcinoma. - Cycle 1 of carboplatin, Taxol and pembrolizumab on 07/23/2022   2.  Social/family history: - He lives at home with his wife.  Independent of ADLs and IADLs.  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  May have had asbestos exposure 30 years ago.  Quit smoking cigarettes in 1965.  Smoked 1 pack/day for less than 12 years.  2 of his brothers had lung cancer and both were smokers.  Plan: 1.  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  Leg pains are better. - Reviewed labs today which showed normal LFTs.  CBC was grossly normal.  TSH is 1.12. - Proceed with single agent immunotherapy with Keytruda today.  RTC 3 weeks for follow-up.  I plan to repeat CT neck and  chest prior to next visit.   2.  Neuropathy: - Leg pains and neuropathy improved after paclitaxel dose reduced to 80 mg/m.  Today he will not receive any more paclitaxel.  Orders Placed This  Encounter  Procedures   Magnesium    Standing Status:   Future    Standing Expiration Date:   12/21/2023   CBC with Differential    Standing Status:   Future    Standing Expiration Date:   12/22/2023   Comprehensive metabolic panel    Standing Status:   Future    Standing Expiration Date:   12/22/2023   T4    Standing Status:   Future    Standing Expiration Date:   12/22/2023   TSH    Standing Status:   Future    Standing Expiration Date:   12/22/2023   Magnesium    Standing Status:   Future    Standing Expiration Date:   01/11/2024   CBC with Differential    Standing Status:   Future    Standing Expiration Date:   01/12/2024   Comprehensive metabolic panel    Standing Status:   Future    Standing Expiration Date:   01/12/2024   Magnesium    Standing Status:   Future    Standing Expiration Date:   02/01/2024   CBC with Differential    Standing Status:   Future    Standing Expiration Date:   02/02/2024   Comprehensive metabolic panel    Standing Status:   Future    Standing Expiration Date:   02/02/2024   Magnesium    Standing Status:   Future    Standing Expiration Date:   02/22/2024   CBC with Differential    Standing Status:   Future    Standing Expiration Date:   02/23/2024   Comprehensive metabolic panel    Standing Status:   Future    Standing Expiration Date:   02/23/2024   T4    Standing Status:   Future    Standing Expiration Date:   02/23/2024   TSH    Standing Status:   Future    Standing Expiration Date:   02/23/2024   Magnesium    Standing Status:   Future    Standing Expiration Date:   03/14/2024   CBC with Differential    Standing Status:   Future    Standing Expiration Date:   03/15/2024   Comprehensive metabolic panel    Standing Status:   Future    Standing Expiration Date:   03/15/2024   Magnesium    Standing Status:   Future    Standing Expiration Date:   04/04/2024   CBC with Differential    Standing Status:   Future    Standing Expiration Date:   04/05/2024    Comprehensive metabolic panel    Standing Status:   Future    Standing Expiration Date:   04/05/2024      Beverly Gust Oliver,acting as a scribe for Derek Jack, MD.,have documented all relevant documentation on the behalf of Derek Jack, MD,as directed by  Derek Jack, MD while in the presence of Derek Jack, MD.   I, Derek Jack MD, have reviewed the above documentation for accuracy and completeness, and I agree with the above.   Derek Jack, MD   2/19/20246:51 PM  CHIEF COMPLAINT:   Diagnosis: poorly differentiated squamous cell carcinoma    Cancer Staging  Squamous cell carcinoma of head and neck ([place])  Staging form: Cervical Lymph Nodes and Unknown Primary Tumors of the Head and Neck, AJCC 8th Edition - Clinical stage from 07/16/2022: Stage IVC (cT0, cN3b, cM1) - Unsigned    Prior Therapy: poorly differentiated squamous cell carcinoma    Current Therapy:Carboplatin, paclitaxel and pembrolizumab     HISTORY OF PRESENT ILLNESS:   Oncology History  Squamous cell carcinoma of head and neck ([place])  07/16/2022 Initial Diagnosis   Squamous cell carcinoma of head and neck ([place])   07/23/2022 -  Chemotherapy   Patient is on Treatment Plan : Carboplatin  + Paclitaxel  + Pembrolizumab (200) D1 q21d        INTERVAL HISTORY:   [name] is a 89 y.o. male presenting to clinic today for follow up of poorly differentiated squamous cell carcinoma . He was last seen by me on 11/09/2022.  Today, he states that he is doing well overall. His appetite level is at poorly differentiated squamous cell carc. He has 3/10 pain all over.He denies any issues since his last visit. He has no nauseua, vomiting,and diarrhea.He no longer longer has a blood clot on his heart/chest.   PAST MEDICAL HISTORY:   Past Medical History: Past Medical History:  Diagnosis Date   Anemia    Atrial fibrillation (HCC)    Cataract    Cellulitis    In past    Complication of anesthesia    HARD TIME WAKING; CAUSES MY BP TO GO UP    Coronary artery disease    5 bypasses   DJD (degenerative joint disease)    Ejection fraction < 50%    Mildly reduced, 40% by echo   GERD (gastroesophageal reflux disease)    Hyperlipidemia    Hypertension    Persistent atrial fibrillation (HCC)    Prostate hypertrophy    on CT scan 09/2014   PUD (peptic ulcer disease)    RESOLVED    Skin cancer of eyelid    Resected    Surgical History: Past Surgical History:  Procedure Laterality Date   APPENDECTOMY     BYPASS GRAFT  2005   CATARACT EXTRACTION W/PHACO Left 07/22/2015   Procedure: CATARACT EXTRACTION PHACO AND INTRAOCULAR LENS PLACEMENT LEFT EYE CDE=8.14;  Surgeon: Tonny Branch, MD;  Location: [facility];  Service: Ophthalmology;  Laterality: Left;   CATARACT EXTRACTION W/PHACO Right 08/18/2019   Procedure: CATARACT EXTRACTION PHACO AND INTRAOCULAR LENS PLACEMENT (IOC);  Surgeon: Baruch Goldmann, MD;  Location: [facility];  Service: Ophthalmology;  Laterality: Right;  CDE: 9.74   CORONARY ARTERY BYPASS GRAFT  4/05   LIMA to LAD, SVG to PDA, SVG to ramus intermediate   EYE SURGERY  07/2015   EYELID CARCINOMA EXCISION     Skin cancer resected   Heart bypass  2005   LYMPH NODE BIOPSY Right 06/09/2022   Procedure: LYMPH NODE BIOPSY; supraclavicular;  Surgeon: Virl Cagey, MD;  Location: [facility];  Service: General;  Laterality: Right;   PORTACATH PLACEMENT Left 06/09/2022   Procedure: INSERTION PORT-A-CATH;  Surgeon: Virl Cagey, MD;  Location: [facility];  Service: General;  Laterality: Left;   TOTAL HIP ARTHROPLASTY     Right   TOTAL HIP ARTHROPLASTY Left 05/04/2018   Procedure: LEFT TOTAL HIP ARTHROPLASTY;  Surgeon: [name], MD;  Location: [facility];  Service: Orthopedics;  Laterality: Left;   TOTAL KNEE ARTHROPLASTY     Right    Social History: Social History   Socioeconomic History   Marital status: Married    Spouse  name: [name]   Number of  children: 1   Years of education: 10   Highest education level: 10th grade  Occupational History   Occupation: retired  Tobacco Use   Smoking status: Former    Packs/day: 2.00    Years: 5.00    Total pack years: 10.00    Types: Cigarettes    Quit date: 12/23/1963    Years since quitting: 58.9   Smokeless tobacco: Never  Vaping Use   Vaping Use: Never used  Substance and Sexual Activity   Alcohol use: No   Drug use: No   Sexual activity: Yes  Other Topics Concern   Not on file  Social History Narrative   Lives in a split level home.   Social Determinants of Health   Financial Resource Strain: Low Risk  (12/11/2021)   Overall Financial Resource Strain (CARDIA)    Difficulty of Paying Living Expenses: Not hard at all  Food Insecurity: No Food Insecurity (12/11/2021)   Hunger Vital Sign    Worried About Running Out of Food in the Last Year: Never true    Ran Out of Food in the Last Year: Never true  Transportation Needs: No Transportation Needs (12/11/2021)   PRAPARE - Hydrologist (Medical): No    Lack of Transportation (Non-Medical): No  Physical Activity: Insufficiently Active (12/11/2021)   Exercise Vital Sign    Days of Exercise per Week: 7 days    Minutes of Exercise per Session: 20 min  Stress: No Stress Concern Present (12/11/2021)   [place]    Feeling of Stress : Only a little  Recent Concern: Stress - Stress Concern Present (10/31/2021)   [place]    Feeling of Stress : To some extent  Social Connections: Moderately Isolated (12/11/2021)   Social Connection and Isolation Panel [NHANES]    Frequency of Communication with Friends and Family: More than three times a week    Frequency of Social Gatherings with Friends and Family: More than three times a week    Attends Religious Services: Never    Corporate treasurer or Organizations: No    Attends Archivist Meetings: Never    Marital Status: Married  Human resources officer Violence: Not At Risk (12/11/2021)   Humiliation, Afraid, Rape, and Kick questionnaire    Fear of Current or Ex-Partner: No    Emotionally Abused: No    Physically Abused: No    Sexually Abused: No    Family History: Family History  Problem Relation Age of Onset   Stroke Mother    Stroke Father    Hypertension Father    Early death Brother        52 months old   Cancer Brother        lung   Stroke Brother        heat    Current Medications:  Current Outpatient Medications:    acetaminophen (TYLENOL) 500 MG tablet, Take 1,000 mg by mouth every 6 (six) hours as needed for moderate pain., Disp: , Rfl:    CARBOPLATIN IV, Inject into the vein every 21 ( twenty-one) days., Disp: , Rfl:    cetirizine-pseudoephedrine (ZYRTEC-D) 5-120 MG tablet, Take once per day as need for congestion/runny nose, Disp: 10 tablet, Rfl: 0   Cholecalciferol (VITAMIN D3) 5000 units CAPS, Take 5,000 Units by mouth once a week. , Disp: ,  Rfl:    diltiazem (CARDIZEM CD) 120 MG 24 hr capsule, Take one capsule daily, Disp: 90 capsule, Rfl: 3   ezetimibe (ZETIA) 10 MG tablet, Take 1 tablet (10 mg total) by mouth daily., Disp: 90 tablet, Rfl: 3   finasteride (PROSCAR) 5 MG tablet, TAKE ONE (1) TABLET EACH DAY, Disp: 90 tablet, Rfl: 3   fluticasone (FLONASE) 50 MCG/ACT nasal spray, Place 1 spray into both nostrils daily as needed for allergies or rhinitis., Disp: , Rfl:    furosemide (LASIX) 20 MG tablet, Take 1 tablet (20 mg total) by mouth daily., Disp: 90 tablet, Rfl: 3   lidocaine-prilocaine (EMLA) cream, Apply a small amount to port a cath site and cover with plastic wrap 1 hour prior to chemotherapy appointments, Disp: 30 g, Rfl: 3   lovastatin (MEVACOR) 20 MG tablet, TAKE 1/2 TABLET EVERY OTHER DAY, Disp: 23 tablet, Rfl: 3   metoprolol tartrate (LOPRESSOR) 100 MG tablet, TAKE ONE (1) TABLET  BY MOUTH TWO (2) TIMES DAILY, Disp: 180 tablet, Rfl: 3   montelukast (SINGULAIR) 10 MG tablet, Take 1 tablet (10 mg total) by mouth at bedtime., Disp: 90 tablet, Rfl: 3   PACLITAXEL IV, Inject into the vein every 21 ( twenty-one) days., Disp: , Rfl:    PEMBROLIZUMAB IV, Inject into the vein every 21 ( twenty-one) days., Disp: , Rfl:    prochlorperazine (COMPAZINE) 10 MG tablet, Take 1 tablet (10 mg total) by mouth every 6 (six) hours as needed for nausea or vomiting., Disp: 60 tablet, Rfl: 3   tobramycin (TOBREX) 0.3 % ophthalmic solution, , Disp: , Rfl:    warfarin (COUMADIN) 5 MG tablet, TAKE 1 TABLET ON SAT, SUN, TUES, & THURSTAKE 1/2 TABLET ON MON, WED, & FRIDAY, Disp: 70 tablet, Rfl: 3 No current facility-administered medications for this visit.  Facility-Administered Medications Ordered in Other Visits:    sodium chloride flush (NS) 0.9 % injection 10 mL, 10 mL, Intracatheter, PRN, Derek Jack, MD, 10 mL at 11/30/22 1245   Allergies: Allergies  Allergen Reactions   Penicillins Hives and Rash    Has patient had a PCN reaction causing immediate rash, facial/tongue/throat swelling, SOB or lightheadedness with hypotension: Yes Has patient had a PCN reaction causing severe rash involving mucus membranes or skin necrosis: Yes Has patient had a PCN reaction that required hospitalization: No Has patient had a PCN reaction occurring within the last 10 years: No If all of the above answers are "NO", then may proceed with Cephalosporin use.    Hct [Hydrochlorothiazide] Other (See Comments)    hyper   Statins Other (See Comments)    Myalgia, tolerates low dosages of lovastatin     REVIEW OF SYSTEMS:   Review of Systems  Constitutional:  Negative for chills, fatigue and fever.  HENT:   Negative for lump/mass, mouth sores, nosebleeds, sore throat and trouble swallowing.   Eyes:  Negative for eye problems.  Respiratory:  Negative for cough and shortness of breath.   Cardiovascular:   Positive for chest pain (At times). Negative for leg swelling and palpitations.  Gastrointestinal:  Negative for abdominal pain, constipation, diarrhea, nausea and vomiting.  Genitourinary:  Positive for frequency. Negative for bladder incontinence, difficulty urinating, dysuria, hematuria and nocturia.   Musculoskeletal:  Negative for arthralgias, back pain, flank pain, myalgias and neck pain.  Skin:  Negative for itching and rash.  Neurological:  Positive for dizziness. Negative for headaches and numbness.  Hematological:  Does not bruise/bleed easily.  Psychiatric/Behavioral:  Negative  for depression, sleep disturbance and suicidal ideas. The patient is not nervous/anxious.   All other systems reviewed and are negative.    VITALS:   There were no vitals taken for this visit.  Wt Readings from Last 3 Encounters:  11/30/22 184 lb 12.8 oz (83.8 kg)  11/27/22 186 lb (84.4 kg)  11/09/22 184 lb 8 oz (83.7 kg)    There is no height or weight on file to calculate BMI.  Performance status (ECOG): 1 - Symptomatic but completely ambulatory  PHYSICAL EXAM:   Physical Exam Vitals and nursing note reviewed. Exam conducted with a chaperone present.  Constitutional:      Appearance: Normal appearance.  Cardiovascular:     Rate and Rhythm: Normal rate and regular rhythm.     Pulses: Normal pulses.     Heart sounds: Normal heart sounds.  Pulmonary:     Effort: Pulmonary effort is normal.     Breath sounds: Normal breath sounds.  Abdominal:     Palpations: Abdomen is soft. There is no hepatomegaly, splenomegaly or mass.     Tenderness: There is no abdominal tenderness.  Musculoskeletal:     Right lower leg: No edema.     Left lower leg: No edema.  Lymphadenopathy:     Cervical: No cervical adenopathy.     Right cervical: No superficial, deep or posterior cervical adenopathy.    Left cervical: No superficial, deep or posterior cervical adenopathy.     Upper Body:     Right upper body:  No supraclavicular or axillary adenopathy.     Left upper body: No supraclavicular or axillary adenopathy.  Neurological:     General: No focal deficit present.     Mental Status: He is alert and oriented to person, place, and time.  Psychiatric:        Mood and Affect: Mood normal.        Behavior: Behavior normal.     LABS:      Latest Ref Rng & Units 11/30/2022    9:22 AM 11/09/2022    8:14 AM 10/19/2022    8:01 AM  CBC  WBC 4.0 - 10.5 K/uL 12.5  9.8  13.5   Hemoglobin 13.0 - 17.0 g/dL 11.7  11.6  11.9   Hematocrit 39.0 - 52.0 % 36.4  36.7  37.0   Platelets 150 - 400 K/uL 281  234  281       Latest Ref Rng & Units 11/30/2022    9:22 AM 11/09/2022    8:14 AM 10/19/2022    8:01 AM  CMP  Glucose 70 - 99 mg/dL 96  118  130   BUN 8 - 23 mg/dL 14  17  20    Creatinine 0.61 - 1.24 mg/dL 0.87  0.92  0.96   Sodium 135 - 145 mmol/L 140  139  137   Potassium 3.5 - 5.1 mmol/L 4.0  3.7  3.9   Chloride 98 - 111 mmol/L 106  105  104   CO2 22 - 32 mmol/L 28  27  24    Calcium 8.9 - 10.3 mg/dL 8.7  8.7  8.8   Total Protein 6.5 - 8.1 g/dL 6.3  6.4  6.5   Total Bilirubin 0.3 - 1.2 mg/dL 0.5  0.5  0.6   Alkaline Phos 38 - 126 U/L 81  87  79   AST 15 - 41 U/L 19  19  16    ALT 0 - 44 U/L 14  13  14      Lab Results  Component Value Date   CEA1 5.4 (H) 06/03/2022   /  CEA  Date Value Ref Range Status  06/03/2022 5.4 (H) 0.0 - 4.7 ng/mL Final    Comment:    (NOTE)                             Nonsmokers          <3.9                             Smokers             <5.6 Roche [employer] Electrochemiluminescence Immunoassay (ECLIA) Values obtained with different assay methods or kits cannot be used interchangeably.  Results cannot be interpreted as absolute evidence of the presence or absence of malignant disease. Performed At: [facility] [place], [place] [id number] Rush Farmer MD [id number]    Lab Results  Component Value Date   PSA1 11.3 (H)  02/16/2022   Lab Results  Component Value Date   YCX448 18 (H) 06/03/2022   No results found for: "CAN125"  No results found for: "TOTALPROTELP", "ALBUMINELP", "A1GS", "A2GS", "BETS", "BETA2SER", "GAMS", "MSPIKE", "SPEI" No results found for: "TIBC", "FERRITIN", "IRONPCTSAT" Lab Results  Component Value Date   LDH 164 06/03/2022     STUDIES:   No results found.

## 2022-11-30 NOTE — Progress Notes (Signed)
Pt presents today for Keytruda per provider's order. Vitals signs and labs WNL for treatment. Okay to proceed with treatment per Dr.K.  [name] given today per MD orders. Tolerated infusion without adverse affects. Vital signs stable. No complaints at this time. Discharged from clinic ambulatory in stable condition. Alert and oriented x 3. F/U with [facility] as scheduled.

## 2022-12-02 ENCOUNTER — Ambulatory Visit: Payer: Medicare Other

## 2022-12-16 ENCOUNTER — Ambulatory Visit (HOSPITAL_COMMUNITY)
Admission: RE | Admit: 2022-12-16 | Discharge: 2022-12-16 | Disposition: A | Discharge status: Home / Self Care | Payer: Medicare Other | Source: Ambulatory Visit | Attending: Hematology | Admitting: Hematology

## 2022-12-16 DIAGNOSIS — I517 Cardiomegaly: Secondary | ICD-10-CM | POA: Diagnosis not present

## 2022-12-16 DIAGNOSIS — C760 Malignant neoplasm of head, face and neck: Secondary | ICD-10-CM | POA: Insufficient documentation

## 2022-12-16 DIAGNOSIS — J984 Other disorders of lung: Secondary | ICD-10-CM | POA: Diagnosis not present

## 2022-12-16 DIAGNOSIS — K769 Liver disease, unspecified: Secondary | ICD-10-CM | POA: Diagnosis not present

## 2022-12-16 DIAGNOSIS — K7460 Unspecified cirrhosis of liver: Secondary | ICD-10-CM | POA: Diagnosis not present

## 2022-12-16 DIAGNOSIS — K8020 Calculus of gallbladder without cholecystitis without obstruction: Secondary | ICD-10-CM | POA: Insufficient documentation

## 2022-12-16 DIAGNOSIS — K5730 Diverticulosis of large intestine without perforation or abscess without bleeding: Secondary | ICD-10-CM | POA: Insufficient documentation

## 2022-12-16 DIAGNOSIS — R918 Other nonspecific abnormal finding of lung field: Secondary | ICD-10-CM | POA: Insufficient documentation

## 2022-12-16 DIAGNOSIS — I700 Atherosclerosis of aorta: Secondary | ICD-10-CM | POA: Diagnosis not present

## 2022-12-16 DIAGNOSIS — R590 Localized enlarged lymph nodes: Secondary | ICD-10-CM | POA: Diagnosis not present

## 2022-12-16 DIAGNOSIS — N281 Cyst of kidney, acquired: Secondary | ICD-10-CM | POA: Diagnosis not present

## 2022-12-16 MED ORDER — IOHEXOL 300 MG/ML  SOLN
150.0000 mL | Freq: Once | INTRAMUSCULAR | Status: AC | PRN
  Administered 2022-12-16: 150 mL via INTRAVENOUS

## 2022-12-20 NOTE — Patient Instructions (Signed)
[facility] at [facility] Discharge Instructions   You were seen and examined today by Dr. Delton Coombes.  He reviewed the results of your lab work which are normal/stable.   He reviewed the results of your CT scans. The CT of your chest, abdomen, and pelvis is stable. The CT of your neck shows a mixed response to treatment, where some lymph nodes have gotten smaller, and other lymph nodes have gotten bigger. There are no new areas of cancer noted on either of the scans.   We will proceed with your treatment today.   You may use a thick, moisturizing lotion (for example, Vasoline Intensive Care, Udder Cream) on your body to help with the itching and rash that the [facility] may cause.   Return as scheduled.    Thank you for choosing [place] at [facility] to provide your oncology and hematology care.  To afford each patient quality time with our provider, please arrive at least 15 minutes before your scheduled appointment time.   If you have a lab appointment with the [facility] please come in thru the Main Entrance and check in at the main information desk.  You need to re-schedule your appointment should you arrive 10 or more minutes late.  We strive to give you quality time with our providers, and arriving late affects you and other patients whose appointments are after yours.  Also, if you no show three or more times for appointments you may be dismissed from the clinic at the providers discretion.     Again, thank you for choosing [facility].  Our hope is that these requests will decrease the amount of time that you wait before being seen by our physicians.       _____________________________________________________________  Should you have questions after your visit to [facility], please contact our office at [phone] and follow the prompts.  Our office hours are 8:00 a.m. and 4:30 p.m. Monday - Friday.   Please note that voicemails left after 4:00 p.m. may not be returned until the following business day.  We are closed weekends and major holidays.  You do have access to a nurse 24-7, just call the main number to the clinic [phone] and do not press any options, hold on the line and a nurse will answer the phone.    For prescription refill requests, have your pharmacy contact our office and allow 72 hours.    Due to Covid, you will need to wear a mask upon entering the hospital. If you do not have a mask, a mask will be given to you at the Main Entrance upon arrival. For doctor visits, patients may have 1 support person age 26 or older with them. For treatment visits, patients can not have anyone with them due to social distancing guidelines and our immunocompromised population.

## 2022-12-21 ENCOUNTER — Inpatient Hospital Stay: Payer: Medicare Other

## 2022-12-21 ENCOUNTER — Inpatient Hospital Stay (HOSPITAL_BASED_OUTPATIENT_CLINIC_OR_DEPARTMENT_OTHER): Payer: Medicare Other | Admitting: Hematology

## 2022-12-21 ENCOUNTER — Inpatient Hospital Stay: Payer: Medicare Other | Attending: Hematology

## 2022-12-21 VITALS — BP 139/78 | HR 73 | Temp 97.3°F | Resp 18

## 2022-12-21 DIAGNOSIS — Z95828 Presence of other vascular implants and grafts: Secondary | ICD-10-CM | POA: Diagnosis not present

## 2022-12-21 DIAGNOSIS — Z5112 Encounter for antineoplastic immunotherapy: Secondary | ICD-10-CM | POA: Insufficient documentation

## 2022-12-21 DIAGNOSIS — C760 Malignant neoplasm of head, face and neck: Secondary | ICD-10-CM | POA: Insufficient documentation

## 2022-12-21 DIAGNOSIS — Z79899 Other long term (current) drug therapy: Secondary | ICD-10-CM | POA: Diagnosis not present

## 2022-12-21 DIAGNOSIS — G629 Polyneuropathy, unspecified: Secondary | ICD-10-CM | POA: Insufficient documentation

## 2022-12-21 DIAGNOSIS — R918 Other nonspecific abnormal finding of lung field: Secondary | ICD-10-CM | POA: Insufficient documentation

## 2022-12-21 DIAGNOSIS — Z87891 Personal history of nicotine dependence: Secondary | ICD-10-CM | POA: Diagnosis not present

## 2022-12-21 LAB — CBC WITH DIFFERENTIAL/PLATELET
Abs Immature Granulocytes: 0.02 10*3/uL (ref 0.00–0.07)
Basophils Absolute: 0 10*3/uL (ref 0.0–0.1)
Basophils Relative: 0 %
Eosinophils Absolute: 1.3 10*3/uL — ABNORMAL HIGH (ref 0.0–0.5)
Eosinophils Relative: 12 %
HCT: 38.6 % — ABNORMAL LOW (ref 39.0–52.0)
Hemoglobin: 12.5 g/dL — ABNORMAL LOW (ref 13.0–17.0)
Immature Granulocytes: 0 %
Lymphocytes Relative: 36 %
Lymphs Abs: 3.9 10*3/uL (ref 0.7–4.0)
MCH: 35 pg — ABNORMAL HIGH (ref 26.0–34.0)
MCHC: 32.4 g/dL (ref 30.0–36.0)
MCV: 108.1 fL — ABNORMAL HIGH (ref 80.0–100.0)
Monocytes Absolute: 0.7 10*3/uL (ref 0.1–1.0)
Monocytes Relative: 6 %
Neutro Abs: 4.9 10*3/uL (ref 1.7–7.7)
Neutrophils Relative %: 46 %
Platelets: 202 10*3/uL (ref 150–400)
RBC: 3.57 MIL/uL — ABNORMAL LOW (ref 4.22–5.81)
RDW: 13.1 % (ref 11.5–15.5)
WBC: 10.8 10*3/uL — ABNORMAL HIGH (ref 4.0–10.5)
nRBC: 0 % (ref 0.0–0.2)

## 2022-12-21 LAB — COMPREHENSIVE METABOLIC PANEL
ALT: 16 U/L (ref 0–44)
AST: 19 U/L (ref 15–41)
Albumin: 3.7 g/dL (ref 3.5–5.0)
Alkaline Phosphatase: 67 U/L (ref 38–126)
Anion gap: 6 (ref 5–15)
BUN: 17 mg/dL (ref 8–23)
CO2: 26 mmol/L (ref 22–32)
Calcium: 8.5 mg/dL — ABNORMAL LOW (ref 8.9–10.3)
Chloride: 105 mmol/L (ref 98–111)
Creatinine, Ser: 0.94 mg/dL (ref 0.61–1.24)
GFR, Estimated: >60 mL/min (ref >60)
Glucose, Bld: 96 mg/dL (ref 70–99)
Potassium: 4 mmol/L (ref 3.5–5.1)
Sodium: 137 mmol/L (ref 135–145)
Total Bilirubin: 0.5 mg/dL (ref 0.3–1.2)
Total Protein: 6.6 g/dL (ref 6.5–8.1)

## 2022-12-21 LAB — MAGNESIUM: Magnesium: 2 mg/dL (ref 1.7–2.4)

## 2022-12-21 LAB — TSH: TSH: 2.368 u[IU]/mL (ref 0.350–4.500)

## 2022-12-21 MED ORDER — SODIUM CHLORIDE 0.9 % IV SOLN
200.0000 mg | Freq: Once | INTRAVENOUS | Status: AC
  Administered 2022-12-21: 200 mg via INTRAVENOUS
  Filled 2022-12-21: qty 8

## 2022-12-21 MED ORDER — HEPARIN SOD (PORK) LOCK FLUSH 100 UNIT/ML IV SOLN
500.0000 [IU] | Freq: Once | INTRAVENOUS | Status: AC | PRN
  Administered 2022-12-21: 500 [IU]

## 2022-12-21 MED ORDER — SODIUM CHLORIDE 0.9% FLUSH
10.0000 mL | INTRAVENOUS | Status: DC | PRN
  Administered 2022-12-21: 10 mL

## 2022-12-21 MED ORDER — SODIUM CHLORIDE 0.9% FLUSH
10.0000 mL | INTRAVENOUS | Status: DC | PRN
  Administered 2022-12-21: 10 mL via INTRAVENOUS

## 2022-12-21 MED ORDER — SODIUM CHLORIDE 0.9 % IV SOLN: Freq: Once | INTRAVENOUS | Status: AC

## 2022-12-21 NOTE — Progress Notes (Signed)
[facility] [address], [place] [address]    Clinic Day:  12/21/2022  Referring physician: [name], Fransisca Kaufmann, MD  Patient Care Team: [name], Fransisca Kaufmann, MD as PCP - General (Family Medicine) Minus Breeding, MD as PCP - Cardiology (Cardiology) Irine Seal, MD as Attending Physician (Urology) Minus Breeding, MD as Consulting Physician (Cardiology) Rogene Houston, MD as Consulting Physician (Gastroenterology) Tonny Branch, MD as Consulting Physician (Ophthalmology) [name], MD as Consulting Physician (Orthopedic Surgery) Shea Evans, Norva Riffle, LCSW as [place] Management (Licensed Clinical Social Worker) Derek Jack, MD as Medical Oncologist ([place]) Brien Mates, RN as Oncology Nurse Navigator (Medical Oncology)   ASSESSMENT & PLAN:   Assessment: 1.  Right supraclavicular lymphadenopathy and multiple lung nodules: - Patient noticed right neck mass since Christmas 2022.  10 to 15 pound weight loss since January 2023.  Denies any dysphagia or odynophagia. - CT neck (05/27/2022): Numerous lymph nodes in the right neck, right supraclavicular lymph node mass measures 5.7 x 3.7 cm.  18 mm posterior lymph node in the right lower neck.  Numerous lymph nodes in the right lower and posterior neck approximately 1 cm.  Many have internal necrosis consistent with metastatic disease.  20 mm lymph node just above the right clavicle.  Left level 2 node 12 mm. - CT CAP (05/28/2022): Several bilateral lung nodules, RUL nodule measuring 1.8 x 1.3 cm.  Small clusters of nodules in the medial left upper lobe.  Irregular nodular density along the left side of the mediastinum measuring 2.1 cm.  No metastatic disease in the abdomen or pelvis.  Liver is slightly nodular contour. - Lab work shows elevated white count since 2009, predominantly lymphocytosis. - Pathology (06/09/2022): Metastatic carcinoma positive for p40, p63 and  CK5/6-patchy positivity with CK7.  Negative for TTF-1, Napsin A, PAX8, prostein, PSA, CK20 and CDX2.  - PET scan (06/11/2022): Bulky right supraclavicular nodal mass, smaller hypermetabolic right posterior triangle and jugular lymph nodes and single hypermetabolic left level 3 lymph node.  Bilateral hypermetabolic pulmonary nodules and metastatic pattern.  Minimal hypermetabolic mediastinal nodal metastasis.  Cluster of hypermetabolic right axillary lymph nodes.  No evidence of metastatic disease or primary lesion in the abdomen or pelvis.  No bone lesions. - MRI of the brain: Chronic small vessel ischemic changes with no evidence of metastatic disease. - NGS: T p53 pathogenic variant, MSI-stable, TMB-low, LOH-low, PD-L1 [id number]): Negative, p16 and p18 negative - Cancer type ID: 96% probability squamous cell carcinoma, subtype head and neck/skin.  Cancer types ruled out with 95% confidence includes skin basal cell carcinoma. - Cycle 1 of carboplatin, Taxol and pembrolizumab on 07/23/2022   2.  Social/family history: - He lives at home with his wife.  Independent of ADLs and IADLs.  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  May have had asbestos exposure 30 years ago.  Quit smoking cigarettes in 1965.  Smoked 1 pack/day for less than 12 years.  2 of his brothers had lung cancer and both were smokers.  Plan: 1.  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  Complained of itching and mild rash on the forearms. - Recommended using moisturizing lotion twice daily. - Reviewed CT CAP (12/16/2022): Stable size and number of lung nodules.  Slight increased prominence of right axillary lymph node.  No new signs of metastatic disease in the abdomen  or pelvis. - CT soft tissue neck (12/16/2022): Showed couple of lymph nodes at the right level 5B and left level 3 increased by few millimeters in size.   No new lesions seen. - He had somewhat mixed response on the scan.  I have recommended continuing Keytruda for at least 3 more cycles prior to rescanning. - Reviewed labs today which shows normal LFTs and creatinine.  CBC is normal.  Proceed with treatment today and in 3 weeks.  RTC 6 weeks for follow-up.   2.  Neuropathy: - Leg pains and neuropathy improved as he did not receive paclitaxel last time.  Orders Placed This Encounter  Procedures   Magnesium    Standing Status:   Future    Standing Expiration Date:   04/25/2024   CBC with Differential    Standing Status:   Future    Standing Expiration Date:   04/25/2024   Comprehensive metabolic panel    Standing Status:   Future    Standing Expiration Date:   04/25/2024   T4    Standing Status:   Future    Standing Expiration Date:   04/25/2024   TSH    Standing Status:   Future    Standing Expiration Date:   04/25/2024     I,Alexis Herring,acting as a scribe for Derek Jack, MD.,have documented all relevant documentation on the behalf of Derek Jack, MD,as directed by  Derek Jack, MD while in the presence of Derek Jack, MD.  I, Derek Jack MD, have reviewed the above documentation for accuracy and completeness, and I agree with the above.    Derek Jack, MD   3/11/20242:47 PM  CHIEF COMPLAINT:   Diagnosis: poorly differentiated squamous cell carcinoma    Cancer Staging  Squamous cell carcinoma of head and neck ([place]) Staging form: Cervical Lymph Nodes and Unknown Primary Tumors of the Head and Neck, AJCC 8th Edition - Clinical stage from 07/16/2022: Stage IVC (cT0, cN3b, cM1) - Unsigned    Prior Therapy: poorly differentiated squamous cell carcinoma    Current Therapy:Carboplatin, paclitaxel and pembrolizumab     HISTORY OF PRESENT ILLNESS:   Oncology History  Squamous cell carcinoma of head and neck ([place])  07/16/2022 Initial Diagnosis   Squamous cell carcinoma of head  and neck ([place])   07/23/2022 -  Chemotherapy   Patient is on Treatment Plan : Carboplatin  + Paclitaxel  + Pembrolizumab (200) D1 q21d        INTERVAL HISTORY:   [name] is a 89 y.o. male presenting to clinic today for follow up of poorly differentiated squamous cell carcinoma. He was last seen by me on 11/30/22.  Today, he states that he is doing well overall. His appetite level is at 100%. His energy level is at 25%.  Complains of mild erythematous rash on the forearms and itching.  Otherwise he tolerated first cycle of Keytruda very well.  Minor dry cough is stable.   PAST MEDICAL HISTORY:   Past Medical History: Past Medical History:  Diagnosis Date   Anemia    Atrial fibrillation (HCC)    Cataract    Cellulitis    In past   Complication of anesthesia    HARD TIME WAKING; CAUSES MY BP TO GO UP    Coronary artery disease    5 bypasses   DJD (degenerative joint disease)    Ejection fraction < 50%    Mildly reduced, 40% by echo   GERD (gastroesophageal reflux disease)  Hyperlipidemia    Hypertension    Persistent atrial fibrillation (HCC)    Prostate hypertrophy    on CT scan 09/2014   PUD (peptic ulcer disease)    RESOLVED    Skin cancer of eyelid    Resected    Surgical History: Past Surgical History:  Procedure Laterality Date   APPENDECTOMY     BYPASS GRAFT  2005   CATARACT EXTRACTION W/PHACO Left 07/22/2015   Procedure: CATARACT EXTRACTION PHACO AND INTRAOCULAR LENS PLACEMENT LEFT EYE CDE=8.14;  Surgeon: Tonny Branch, MD;  Location: [facility];  Service: Ophthalmology;  Laterality: Left;   CATARACT EXTRACTION W/PHACO Right 08/18/2019   Procedure: CATARACT EXTRACTION PHACO AND INTRAOCULAR LENS PLACEMENT (IOC);  Surgeon: Baruch Goldmann, MD;  Location: [facility];  Service: Ophthalmology;  Laterality: Right;  CDE: 9.74   CORONARY ARTERY BYPASS GRAFT  4/05   LIMA to LAD, SVG to PDA, SVG to ramus intermediate   EYE SURGERY  07/2015   EYELID CARCINOMA EXCISION     Skin  cancer resected   Heart bypass  2005   LYMPH NODE BIOPSY Right 06/09/2022   Procedure: LYMPH NODE BIOPSY; supraclavicular;  Surgeon: Virl Cagey, MD;  Location: [facility];  Service: General;  Laterality: Right;   PORTACATH PLACEMENT Left 06/09/2022   Procedure: INSERTION PORT-A-CATH;  Surgeon: Virl Cagey, MD;  Location: [facility];  Service: General;  Laterality: Left;   TOTAL HIP ARTHROPLASTY     Right   TOTAL HIP ARTHROPLASTY Left 05/04/2018   Procedure: LEFT TOTAL HIP ARTHROPLASTY;  Surgeon: [name], MD;  Location: [facility];  Service: Orthopedics;  Laterality: Left;   TOTAL KNEE ARTHROPLASTY     Right    Social History: Social History   Socioeconomic History   Marital status: Married    Spouse name: [name]   Number of children: 1   Years of education: 10   Highest education level: 10th grade  Occupational History   Occupation: retired  Tobacco Use   Smoking status: Former    Packs/day: 2.00    Years: 5.00    Total pack years: 10.00    Types: Cigarettes    Quit date: 12/23/1963    Years since quitting: 59.0   Smokeless tobacco: Never  Vaping Use   Vaping Use: Never used  Substance and Sexual Activity   Alcohol use: No   Drug use: No   Sexual activity: Yes  Other Topics Concern   Not on file  Social History Narrative   Lives in a split level home.   Social Determinants of Health   Financial Resource Strain: Low Risk  (12/11/2021)   Overall Financial Resource Strain (CARDIA)    Difficulty of Paying Living Expenses: Not hard at all  Food Insecurity: No Food Insecurity (12/11/2021)   Hunger Vital Sign    Worried About Running Out of Food in the Last Year: Never true    Ran Out of Food in the Last Year: Never true  Transportation Needs: No Transportation Needs (12/11/2021)   PRAPARE - Hydrologist (Medical): No    Lack of Transportation (Non-Medical): No  Physical Activity: Insufficiently Active (12/11/2021)   Exercise Vital Sign     Days of Exercise per Week: 7 days    Minutes of Exercise per Session: 20 min  Stress: No Stress Concern Present (12/11/2021)   [place]    Feeling of Stress : Only a little  Recent  Concern: Stress - Stress Concern Present (10/31/2021)   [place]    Feeling of Stress : To some extent  Social Connections: Moderately Isolated (12/11/2021)   Social Connection and Isolation Panel [NHANES]    Frequency of Communication with Friends and Family: More than three times a week    Frequency of Social Gatherings with Friends and Family: More than three times a week    Attends Religious Services: Never    Marine scientist or Organizations: No    Attends Archivist Meetings: Never    Marital Status: Married  Human resources officer Violence: Not At Risk (12/11/2021)   Humiliation, Afraid, Rape, and Kick questionnaire    Fear of Current or Ex-Partner: No    Emotionally Abused: No    Physically Abused: No    Sexually Abused: No    Family History: Family History  Problem Relation Age of Onset   Stroke Mother    Stroke Father    Hypertension Father    Early death Brother        49 months old   Cancer Brother        lung   Stroke Brother        heat    Current Medications:  Current Outpatient Medications:    acetaminophen (TYLENOL) 500 MG tablet, Take 1,000 mg by mouth every 6 (six) hours as needed for moderate pain., Disp: , Rfl:    CARBOPLATIN IV, Inject into the vein every 21 ( twenty-one) days., Disp: , Rfl:    cetirizine-pseudoephedrine (ZYRTEC-D) 5-120 MG tablet, Take once per day as need for congestion/runny nose, Disp: 10 tablet, Rfl: 0   Cholecalciferol (VITAMIN D3) 5000 units CAPS, Take 5,000 Units by mouth once a week. , Disp: , Rfl:    diltiazem (CARDIZEM CD) 120 MG 24 hr capsule, Take one capsule daily, Disp: 90 capsule, Rfl: 3    ezetimibe (ZETIA) 10 MG tablet, Take 1 tablet (10 mg total) by mouth daily., Disp: 90 tablet, Rfl: 3   finasteride (PROSCAR) 5 MG tablet, TAKE ONE (1) TABLET EACH DAY, Disp: 90 tablet, Rfl: 3   fluticasone (FLONASE) 50 MCG/ACT nasal spray, Place 1 spray into both nostrils daily as needed for allergies or rhinitis., Disp: , Rfl:    furosemide (LASIX) 20 MG tablet, Take 1 tablet (20 mg total) by mouth daily., Disp: 90 tablet, Rfl: 3   lidocaine-prilocaine (EMLA) cream, Apply a small amount to port a cath site and cover with plastic wrap 1 hour prior to chemotherapy appointments, Disp: 30 g, Rfl: 3   lovastatin (MEVACOR) 20 MG tablet, TAKE 1/2 TABLET EVERY OTHER DAY, Disp: 23 tablet, Rfl: 3   metoprolol tartrate (LOPRESSOR) 100 MG tablet, TAKE ONE (1) TABLET BY MOUTH TWO (2) TIMES DAILY, Disp: 180 tablet, Rfl: 3   montelukast (SINGULAIR) 10 MG tablet, Take 1 tablet (10 mg total) by mouth at bedtime., Disp: 90 tablet, Rfl: 3   PACLITAXEL IV, Inject into the vein every 21 ( twenty-one) days., Disp: , Rfl:    PEMBROLIZUMAB IV, Inject into the vein every 21 ( twenty-one) days., Disp: , Rfl:    prochlorperazine (COMPAZINE) 10 MG tablet, Take 1 tablet (10 mg total) by mouth every 6 (six) hours as needed for nausea or vomiting., Disp: 60 tablet, Rfl: 3   tobramycin (TOBREX) 0.3 % ophthalmic solution, , Disp: , Rfl:    warfarin (COUMADIN) 5 MG tablet, TAKE 1 TABLET ON SAT, SUN, TUES, &  THURSTAKE 1/2 TABLET ON MON, WED, & FRIDAY, Disp: 70 tablet, Rfl: 3 No current facility-administered medications for this visit.  Facility-Administered Medications Ordered in Other Visits:    heparin lock flush 100 unit/mL, 500 Units, Intracatheter, Once PRN, Derek Jack, MD   sodium chloride flush (NS) 0.9 % injection 10 mL, 10 mL, Intracatheter, PRN, Derek Jack, MD   Allergies: Allergies  Allergen Reactions   Penicillins Hives and Rash    Has patient had a PCN reaction causing immediate rash,  facial/tongue/throat swelling, SOB or lightheadedness with hypotension: Yes Has patient had a PCN reaction causing severe rash involving mucus membranes or skin necrosis: Yes Has patient had a PCN reaction that required hospitalization: No Has patient had a PCN reaction occurring within the last 10 years: No If all of the above answers are "NO", then may proceed with Cephalosporin use.    Hct [Hydrochlorothiazide] Other (See Comments)    hyper   Statins Other (See Comments)    Myalgia, tolerates low dosages of lovastatin     REVIEW OF SYSTEMS:   Review of Systems  Constitutional:  Negative for chills, fatigue and fever.  HENT:   Negative for lump/mass, mouth sores, nosebleeds, sore throat and trouble swallowing.   Eyes:  Negative for eye problems.  Respiratory:  Positive for cough. Negative for shortness of breath.   Cardiovascular:  Negative for chest pain, leg swelling and palpitations.  Gastrointestinal:  Negative for abdominal pain, constipation, diarrhea, nausea and vomiting.  Genitourinary:  Negative for bladder incontinence, difficulty urinating, dysuria, frequency, hematuria and nocturia.   Musculoskeletal:  Negative for arthralgias, back pain, flank pain, myalgias and neck pain.  Skin:  Positive for itching. Negative for rash.  Neurological:  Positive for numbness. Negative for dizziness and headaches.  Hematological:  Does not bruise/bleed easily.  Psychiatric/Behavioral:  Negative for depression, sleep disturbance and suicidal ideas. The patient is not nervous/anxious.   All other systems reviewed and are negative.    VITALS:   There were no vitals taken for this visit.  Wt Readings from Last 3 Encounters:  12/21/22 186 lb 9.6 oz (84.6 kg)  11/30/22 184 lb 12.8 oz (83.8 kg)  11/27/22 186 lb (84.4 kg)    There is no height or weight on file to calculate BMI.  Performance status (ECOG): 1 - Symptomatic but completely ambulatory  PHYSICAL EXAM:   Physical  Exam Vitals and nursing note reviewed. Exam conducted with a chaperone present.  Constitutional:      Appearance: Normal appearance.  Cardiovascular:     Rate and Rhythm: Normal rate and regular rhythm.     Pulses: Normal pulses.     Heart sounds: Normal heart sounds.  Pulmonary:     Effort: Pulmonary effort is normal.     Breath sounds: Normal breath sounds.  Abdominal:     Palpations: Abdomen is soft. There is no hepatomegaly, splenomegaly or mass.     Tenderness: There is no abdominal tenderness.  Musculoskeletal:     Right lower leg: No edema.     Left lower leg: No edema.  Lymphadenopathy:     Cervical: No cervical adenopathy.     Right cervical: No superficial, deep or posterior cervical adenopathy.    Left cervical: No superficial, deep or posterior cervical adenopathy.     Upper Body:     Right upper body: No supraclavicular or axillary adenopathy.     Left upper body: No supraclavicular or axillary adenopathy.  Neurological:     General:  No focal deficit present.     Mental Status: He is alert and oriented to person, place, and time.  Psychiatric:        Mood and Affect: Mood normal.        Behavior: Behavior normal.     LABS:      Latest Ref Rng & Units 12/21/2022   11:55 AM 11/30/2022    9:22 AM 11/09/2022    8:14 AM  CBC  WBC 4.0 - 10.5 K/uL 10.8  12.5  9.8   Hemoglobin 13.0 - 17.0 g/dL 12.5  11.7  11.6   Hematocrit 39.0 - 52.0 % 38.6  36.4  36.7   Platelets 150 - 400 K/uL 202  281  234       Latest Ref Rng & Units 12/21/2022   11:55 AM 11/30/2022    9:22 AM 11/09/2022    8:14 AM  CMP  Glucose 70 - 99 mg/dL 96  96  118   BUN 8 - 23 mg/dL '17  14  17   '$ Creatinine 0.61 - 1.24 mg/dL 0.94  0.87  0.92   Sodium 135 - 145 mmol/L 137  140  139   Potassium 3.5 - 5.1 mmol/L 4.0  4.0  3.7   Chloride 98 - 111 mmol/L 105  106  105   CO2 22 - 32 mmol/L '26  28  27   '$ Calcium 8.9 - 10.3 mg/dL 8.5  8.7  8.7   Total Protein 6.5 - 8.1 g/dL 6.6  6.3  6.4   Total Bilirubin  0.3 - 1.2 mg/dL 0.5  0.5  0.5   Alkaline Phos 38 - 126 U/L 67  81  87   AST 15 - 41 U/L '19  19  19   '$ ALT 0 - 44 U/L '16  14  13      '$ Lab Results  Component Value Date   CEA1 5.4 (H) 06/03/2022   /  CEA  Date Value Ref Range Status  06/03/2022 5.4 (H) 0.0 - 4.7 ng/mL Final    Comment:    (NOTE)                             Nonsmokers          <3.9                             Smokers             <5.6 Roche [employer] Electrochemiluminescence Immunoassay (ECLIA) Values obtained with different assay methods or kits cannot be used interchangeably.  Results cannot be interpreted as absolute evidence of the presence or absence of malignant disease. Performed At: [facility] [place], [place] [id number] Rush Farmer MD [id number]    Lab Results  Component Value Date   PSA1 11.3 (H) 02/16/2022   Lab Results  Component Value Date   CAN199 78 (H) 06/03/2022   No results found for: "CAN125"  No results found for: "TOTALPROTELP", "ALBUMINELP", "A1GS", "A2GS", "BETS", "BETA2SER", "GAMS", "MSPIKE", "SPEI" No results found for: "TIBC", "FERRITIN", "IRONPCTSAT" Lab Results  Component Value Date   LDH 164 06/03/2022     STUDIES:   CT SOFT TISSUE NECK W CONTRAST  Result Date: 12/17/2022 CLINICAL DATA:  Metastatic disease evaluation. Squamous cell carcinoma of the head/neck. EXAM: CT NECK WITH CONTRAST TECHNIQUE: Multidetector CT imaging of the neck was performed using  the standard protocol following the bolus administration of intravenous contrast. RADIATION DOSE REDUCTION: This exam was performed according to the departmental dose-optimization program which includes automated exposure control, adjustment of the mA and/or kV according to patient size and/or use of iterative reconstruction technique. CONTRAST:  145m OMNIPAQUE IOHEXOL 300 MG/ML  SOLN COMPARISON:  Neck CT 09/22/2022 and older. FINDINGS: Pharynx and larynx: Closed glottis.  No mass or edema.  Salivary glands: No inflammation, mass, or stone. Thyroid: Normal. Lymph nodes: Mixed response with some lymph nodes decreased in size and others increased in size from the prior study. For example, the dominant necrotic right level 3 lymph node measures up to 10 x 8 mm (image 69 series 4), previously 19 x 16 mm; and the poorly marginated conglomerate of right level 4 lymph nodes now measures up to 26 x 17 mm (image 77 series 4). Whereas, a right level 5B lymph node measures up to 14 x 12 mm (image 60 series 4), previously 8 x 8 mm; and a left level 3 lymph node now measures up to 11 x 10 mm (image 53 series 4), previously 6 x 6 mm. Vascular: Severe atherosclerotic calcifications of the aortic arch, arch vessel origins, and carotids bulbs. Limited intracranial: Unremarkable. Visualized orbits: Unremarkable. Mastoids and visualized paranasal sinuses: Mild mucosal disease throughout the paranasal sinuses. Mastoids are well aerated. Skeleton: No suspicious bone lesions. Upper chest: Please refer to same-day chest CT report. Other: None. IMPRESSION: 1. Mixed response with some lymph nodes decreased in size and others increased in size from the prior study. 2. Aortic Atherosclerosis (ICD10-I70.0). Electronically Signed   By: WEmmit AlexandersM.D.   On: 12/17/2022 12:32   CT CHEST ABDOMEN PELVIS W CONTRAST  Result Date: 12/17/2022 CLINICAL DATA:  89year old male with history of squamous cell carcinoma of the head and neck status post chemotherapy. Evaluate for metastatic disease. Follow-up study. * Tracking Code: BO * EXAM: CT CHEST, ABDOMEN, AND PELVIS WITH CONTRAST TECHNIQUE: Multidetector CT imaging of the chest, abdomen and pelvis was performed following the standard protocol during bolus administration of intravenous contrast. RADIATION DOSE REDUCTION: This exam was performed according to the departmental dose-optimization program which includes automated exposure control, adjustment of the mA and/or kV according  to patient size and/or use of iterative reconstruction technique. CONTRAST:  1566mOMNIPAQUE IOHEXOL 300 MG/ML  SOLN COMPARISON:  Chest CT 09/22/2022. PET-CT 06/11/2022. CT of the chest, abdomen and pelvis 05/28/2022. FINDINGS: CT CHEST FINDINGS Cardiovascular: Heart size is mildly enlarged. There is no significant pericardial fluid, thickening or pericardial calcification. There is aortic atherosclerosis, as well as atherosclerosis of the great vessels of the mediastinum and the coronary arteries, including calcified atherosclerotic plaque in the left main, left anterior descending, left circumflex and right coronary arteries. Status post median sternotomy for CABG including LIMA to the LAD. Calcifications of the aortic valve. Left-sided subclavian single-lumen Port-A-Cath with tip terminating in the mid superior vena cava. Mediastinum/Nodes: No pathologically enlarged mediastinal or hilar lymph nodes. Moderate-sized hiatal hernia. Prominent borderline enlarged right axillary lymph nodes measuring up to 9 mm in short axis (axial image 30 of series 3), likely metastatic given prior hypermetabolism on prior PET-CT 06/11/2022. No left axillary lymphadenopathy. Lungs/Pleura: Several pulmonary nodules are again noted throughout the lungs bilaterally. These appear stable in size and number compared to the prior study, with the largest of these lesions in the periphery of the right upper lobe (axial image 64 of series 4) measuring 1.5 x 0.9 cm (stable when remeasured  on prior exam 09/22/2022 on image 63 of series 4). No definite new suspicious appearing pulmonary nodules or masses are noted. No acute consolidative airspace disease. No pleural effusions. Scattered areas of septal thickening and mild subpleural reticulation are again noted in the lung bases bilaterally. Musculoskeletal: Median sternotomy wires. There are no aggressive appearing lytic or blastic lesions noted in the visualized portions of the skeleton. CT  ABDOMEN PELVIS FINDINGS Hepatobiliary: Subcentimeter low-attenuation lesions in the right lobe of the liver, too small to definitively characterize, but similar to the prior study and statistically likely to represent tiny cysts (no imaging follow-up recommended). No other new suspicious appearing hepatic lesions are noted. A few scattered calcified granulomas. Liver has a nodular contour suggestive of underlying cirrhosis. Calcified gallstones lie dependently in the gallbladder measuring up to 1.3 cm in diameter. Gallbladder is not distended. No gallbladder wall thickening or pericholecystic fluid. Pancreas: No pancreatic mass. No pancreatic ductal dilatation. No pancreatic or peripancreatic fluid collections or inflammatory changes. Spleen: Unremarkable. Adrenals/Urinary Tract: Low-attenuation lesions in both kidneys compatible with simple cysts, which require no imaging follow-up. In addition, in the lower pole of the right kidney (axial image 82 of series 3) there is a 1.5 cm intermediate attenuation lesion (38 HU) which is incompletely characterized on today's examination, but similar to the prior study and not hypermetabolic on prior PET-CT, likely a proteinaceous/hemorrhagic cyst (no imaging follow-up recommended at this time). No hydroureteronephrosis. Urinary bladder is partially obscured by beam hardening artifact from the patient's bilateral hip arthroplasties, but visualized portions are unremarkable. Bilateral adrenal glands are normal in appearance. Stomach/Bowel: The appearance of the stomach is normal. No pathologic dilatation of small bowel or colon. Multiple colonic diverticula are noted, without surrounding inflammatory changes to indicate an acute diverticulitis at this time. The appendix is not confidently identified and may be surgically absent. Regardless, there are no inflammatory changes noted adjacent to the cecum to suggest the presence of an acute appendicitis at this time.  Vascular/Lymphatic: Atherosclerosis in the abdominal aorta and pelvic vasculature, without evidence of aneurysm or dissection. No lymphadenopathy noted in the abdomen or pelvis. Reproductive: Prostate gland and seminal vesicles are obscured by beam hardening artifact from bilateral hip arthroplasties. Other: No significant volume of ascites.  No pneumoperitoneum. Musculoskeletal: Status post bilateral hip arthroplasty. There are no aggressive appearing lytic or blastic lesions noted in the visualized portions of the skeleton. IMPRESSION: 1. Stable size and number of numerous pulmonary nodules in the lungs. No new pulmonary nodules are noted. 2. Slight increased prominence of borderline enlarged right axillary lymph nodes, which were previously hypermetabolic on PET-CT 99991111, likely metastatic lymph nodes. 3. No signs of new metastatic disease in the abdomen or pelvis. 4. Subtle changes in the lungs concerning for probable interstitial lung disease, categorized as indeterminate for usual interstitial pneumonia (UIP) per current ATS guidelines. Attention on follow-up imaging is recommended. 5. Aortic atherosclerosis, in addition to left main and three-vessel coronary artery disease. Status post median sternotomy for CABG including LIMA to the LAD. 6. There are calcifications of the aortic valve. Echocardiographic correlation for evaluation of potential valvular dysfunction may be warranted if clinically indicated. 7. Colonic diverticulosis without evidence of acute diverticulitis at this time. 8. Cholelithiasis without evidence of acute cholecystitis. 9. Cirrhosis. 10. Cardiomegaly. 11. Additional incidental findings, as above. Electronically Signed   By: Vinnie Langton M.D.   On: 12/17/2022 10:40

## 2022-12-21 NOTE — Progress Notes (Signed)
Patient has been examined by Dr. Katragadda. Vital signs and labs have been reviewed by MD - ANC, Creatinine, LFTs, hemoglobin, and platelets are within treatment parameters per M.D. - pt may proceed with treatment.  Primary RN and pharmacy notified.  

## 2022-12-21 NOTE — Progress Notes (Signed)
Patients port flushed without difficulty.  Good blood return noted with no bruising or swelling noted at site.  Patient remains accessed for chemotherapy treatment.  

## 2022-12-21 NOTE — Patient Instructions (Signed)
[facility]  Discharge Instructions: Thank you for choosing [facility] to provide your oncology and hematology care.  If you have a lab appointment with the Cancer Center, please come in thru the Main Entrance and check in at the main information desk.  Wear comfortable clothing and clothing appropriate for easy access to any Portacath or PICC line.   We strive to give you quality time with your provider. You may need to reschedule your appointment if you arrive late (15 or more minutes).  Arriving late affects you and other patients whose appointments are after yours.  Also, if you miss three or more appointments without notifying the office, you may be dismissed from the clinic at the provider's discretion.      For prescription refill requests, have your pharmacy contact our office and allow 72 hours for refills to be completed.    Today you received the following chemotherapy and/or immunotherapy agents Keytruda   To help prevent nausea and vomiting after your treatment, we encourage you to take your nausea medication as directed.  Pembrolizumab Injection What is this medication? PEMBROLIZUMAB (PEM broe LIZ ue mab) treats some types of cancer. It works by helping your immune system slow or stop the spread of cancer cells. It is a monoclonal antibody. This medicine may be used for other purposes; ask your health care provider or pharmacist if you have questions. COMMON BRAND NAME(S): Keytruda What should I tell my care team before I take this medication? They need to know if you have any of these conditions: Allogeneic stem cell transplant (uses someone else's stem cells) Autoimmune diseases, such as Crohn disease, ulcerative colitis, lupus History of chest radiation Nervous system problems, such as Guillain-Barre syndrome, myasthenia gravis Organ transplant An unusual or allergic reaction to pembrolizumab, other medications, foods, dyes, or  preservatives Pregnant or trying to get pregnant Breast-feeding How should I use this medication? This medication is injected into a vein. It is given by your care team in a hospital or clinic setting. A special MedGuide will be given to you before each treatment. Be sure to read this information carefully each time. Talk to your care team about the use of this medication in children. While it may be prescribed for children as young as 6 months for selected conditions, precautions do apply. Overdosage: If you think you have taken too much of this medicine contact a poison control center or emergency room at once. NOTE: This medicine is only for you. Do not share this medicine with others. What if I miss a dose? Keep appointments for follow-up doses. It is important not to miss your dose. Call your care team if you are unable to keep an appointment. What may interact with this medication? Interactions have not been studied. This list may not describe all possible interactions. Give your health care provider a list of all the medicines, herbs, non-prescription drugs, or dietary supplements you use. Also tell them if you smoke, drink alcohol, or use illegal drugs. Some items may interact with your medicine. What should I watch for while using this medication? Your condition will be monitored carefully while you are receiving this medication. You may need blood work while taking this medication. This medication may cause serious skin reactions. They can happen weeks to months after starting the medication. Contact your care team right away if you notice fevers or flu-like symptoms with a rash. The rash may be red or purple and then turn into   blisters or peeling of the skin. You may also notice a red rash with swelling of the face, lips, or lymph nodes in your neck or under your arms. Tell your care team right away if you have any change in your eyesight. Talk to your care team if you may be pregnant.  Serious birth defects can occur if you take this medication during pregnancy and for 4 months after the last dose. You will need a negative pregnancy test before starting this medication. Contraception is recommended while taking this medication and for 4 months after the last dose. Your care team can help you find the option that works for you. Do not breastfeed while taking this medication and for 4 months after the last dose. What side effects may I notice from receiving this medication? Side effects that you should report to your care team as soon as possible: Allergic reactions--skin rash, itching, hives, swelling of the face, lips, tongue, or throat Dry cough, shortness of breath or trouble breathing Eye pain, redness, irritation, or discharge with blurry or decreased vision Heart muscle inflammation--unusual weakness or fatigue, shortness of breath, chest pain, fast or irregular heartbeat, dizziness, swelling of the ankles, feet, or hands Hormone gland problems--headache, sensitivity to light, unusual weakness or fatigue, dizziness, fast or irregular heartbeat, increased sensitivity to cold or heat, excessive sweating, constipation, hair loss, increased thirst or amount of urine, tremors or shaking, irritability Infusion reactions--chest pain, shortness of breath or trouble breathing, feeling faint or lightheaded Kidney injury (glomerulonephritis)--decrease in the amount of urine, red or dark brown urine, foamy or bubbly urine, swelling of the ankles, hands, or feet Liver injury--right upper belly pain, loss of appetite, nausea, light-colored stool, dark yellow or brown urine, yellowing skin or eyes, unusual weakness or fatigue Pain, tingling, or numbness in the hands or feet, muscle weakness, change in vision, confusion or trouble speaking, loss of balance or coordination, trouble walking, seizures Rash, fever, and swollen lymph nodes Redness, blistering, peeling, or loosening of the skin,  including inside the mouth Sudden or severe stomach pain, bloody diarrhea, fever, nausea, vomiting Side effects that usually do not require medical attention (report to your care team if they continue or are bothersome): Bone, joint, or muscle pain Diarrhea Fatigue Loss of appetite Nausea Skin rash This list may not describe all possible side effects. Call your doctor for medical advice about side effects. You may report side effects to FDA at 1-800-FDA-1088. Where should I keep my medication? This medication is given in a hospital or clinic. It will not be stored at home. NOTE: This sheet is a summary. It may not cover all possible information. If you have questions about this medicine, talk to your doctor, pharmacist, or health care provider.  2023 Elsevier/Gold Standard (2022-02-10 00:00:00)   BELOW ARE SYMPTOMS THAT SHOULD BE REPORTED IMMEDIATELY: *FEVER GREATER THAN 100.4 F (38 C) OR HIGHER *CHILLS OR SWEATING *NAUSEA AND VOMITING THAT IS NOT CONTROLLED WITH YOUR NAUSEA MEDICATION *UNUSUAL SHORTNESS OF BREATH *UNUSUAL BRUISING OR BLEEDING *URINARY PROBLEMS (pain or burning when urinating, or frequent urination) *BOWEL PROBLEMS (unusual diarrhea, constipation, pain near the anus) TENDERNESS IN MOUTH AND THROAT WITH OR WITHOUT PRESENCE OF ULCERS (sore throat, sores in mouth, or a toothache) UNUSUAL RASH, SWELLING OR PAIN  UNUSUAL VAGINAL DISCHARGE OR ITCHING   Items with * indicate a potential emergency and should be followed up as soon as possible or go to the Emergency Department if any problems should occur.  Please show the CHEMOTHERAPY   ALERT CARD or IMMUNOTHERAPY ALERT CARD at check-in to the Emergency Department and triage nurse.  Should you have questions after your visit or need to cancel or reschedule your appointment, please contact [facility] [phone]  and follow the prompts.  Office hours are 8:00 a.m. to 4:30 p.m. Monday - Friday. Please  note that voicemails left after 4:00 p.m. may not be returned until the following business day.  We are closed weekends and major holidays. You have access to a nurse at all times for urgent questions. Please call the main number to the clinic [phone] and follow the prompts.  For any non-urgent questions, you may also contact your provider using MyChart. We now offer e-Visits for anyone 18 and older to request care online for non-urgent symptoms. For details visit [email or web address].   Also download the MyChart app! Go to the app store, search "MyChart", open the app, select [facility], and log in with your MyChart username and password.   

## 2022-12-21 NOTE — Progress Notes (Signed)
Pt presents today for Keytruda per provider's order. Vital signs and labs WNL for treatment. Okay to proceed with treatment. Okay to proceed with treatment today per Dr.K.  Beryle Flock given today per MD orders. Tolerated infusion without adverse affects. Vital signs stable. No complaints at this time. Discharged from clinic ambulatory in stable condition. Alert and oriented x 3. F/U with [facility] as scheduled.

## 2022-12-23 LAB — T4: T4, Total: 7.7 ug/dL (ref 4.5–12.0)

## 2022-12-28 ENCOUNTER — Telehealth: Payer: Self-pay | Admitting: Family Medicine

## 2022-12-28 NOTE — Telephone Encounter (Signed)
Contacted [facility] to schedule their annual wellness visit. Appointment made for 01/06/2023.   Thank you,  Colletta [place],  [facility] Direct Dial ??[id number]

## 2023-01-06 ENCOUNTER — Ambulatory Visit (INDEPENDENT_AMBULATORY_CARE_PROVIDER_SITE_OTHER): Payer: Medicare Other

## 2023-01-06 VITALS — Ht 67.0 in | Wt 180.0 lb

## 2023-01-06 DIAGNOSIS — Z0000 Encounter for general adult medical examination without abnormal findings: Secondary | ICD-10-CM

## 2023-01-06 NOTE — Patient Instructions (Signed)
[name] , Thank you for taking time to come for your Medicare Wellness Visit. I appreciate your ongoing commitment to your health goals. Please review the following plan we discussed and let me know if I can assist you in the future.   These are the goals we discussed:  Goals       AWV      12/10/2020 AWV Goal: Fall Prevention  Over the next year, patient will decrease their risk for falls by: Using assistive devices, such as a cane or walker, as needed Identifying fall risks within their home and correcting them by: Removing throw rugs Adding handrails to stairs or ramps Removing clutter and keeping a clear pathway throughout the home Increasing light, especially at night Adding shower handles/bars Raising toilet seat Identifying potential personal risk factors for falls: Medication side effects Incontinence/urgency Vestibular dysfunction Hearing loss Musculoskeletal disorders Neurological disorders Orthostatic hypotension        Client will talk with LCSW in next 30 days to discuss [name] and completion of daily activities (pt-stated)      CARE PLAN ENTRY  Current Barriers:  Pain issues in [name], Atrial Fibrillation, GERD, OA, [name], [name], CHF  Clinical Social Work Clinical Goal(s):  LCSW will call client in next 30 days to talk with [name] and about his completion of daily activities  Interventions: Talked with client about CCM program support Talked with client about ambulation challenges Talked with client about vision challenges of client Talked with client about transport needs of client Talked with client about relaxation techniques of client (likes to work outdoors, care for their yard, visit with family and friends) Talked with client about upcoming client medical appointments Talked with client about past history of heart issues Talked with client about his energy level and  occasional decreased energy on exertion Talked with client about occasional shortness of breath Talked with client about his use of grab bars in the home  Patient Self Care Activities:  Completes ADLs independently Drives to appointments and to complete errands  Patient Self Care Deficits:  Pain issues in hips  Initial goal documentation       DIET - INCREASE WATER INTAKE      Try to drink 6-8 glasses of water daily      Protect My Health;Complete ADLs daily, as able      Timeframe:  Short-Term Goal Priority:  Medium Progress: On Track Start Date:       10/31/21                  Expected End Date:       01/27/22            Follow Up Date 12/25/21 at 2:00 PM   Protect My Health (Patient) Complete ADLs daily, as able   Why is this important?   Screening tests can find diseases early when they are easier to treat.  Your doctor or nurse will talk with you about which tests are important for you.  Getting shots for common diseases like the flu and shingles will help prevent them.     Patient Coping Skills/Strengths: Has support from his spouse, [name] Does some ADLs on his own Attends scheduled medical appointments  Patient Deficits: Needs  some help with completing certain ADLs Some mobility challenges  Patient Goals:  Attend scheduled medical appointments in next 30 days Talk regularly with spouse, in next 34  days, about client needs Take medications as prescribed in next 30 days Call [facility] as needed in next 30 days for nursing support for client  Follow Up Plan: LCSW to call client or his spouse on 12/25/21 at 2:00 PM to assess client needs          This is a list of the screening recommended for you and due dates:  Health Maintenance  Topic Date Due   DTaP/Tdap/Td vaccine (2 - Td or Tdap) 10/12/2021   COVID-19 Vaccine (6 - 2023-24 season) 06/12/2022   Zoster (Shingles) Vaccine (1 of 2) 02/15/2023*   Medicare Annual Wellness Visit  01/06/2024   Pneumonia  Vaccine  Completed   Flu Shot  Completed   HPV Vaccine  Aged Out  *Topic was postponed. The date shown is not the original due date.    Advanced directives: Please bring a copy of your health care power of attorney and living will to the office to be added to your chart at your convenience.   Conditions/risks identified: Aim for 30 minutes of exercise or brisk walking, 6-8 glasses of water, and 5 servings of fruits and vegetables each day.   Next appointment: Follow up in one year for your annual wellness visit.   Preventive Care 7 Years and Older, Male  Preventive care refers to lifestyle choices and visits with your health care provider that can promote health and wellness. What does preventive care include? A yearly physical exam. This is also called an annual well check. Dental exams once or twice a year. Routine eye exams. Ask your health care provider how often you should have your eyes checked. Personal lifestyle choices, including: Daily care of your teeth and gums. Regular physical activity. Eating a healthy diet. Avoiding tobacco and drug use. Limiting alcohol use. Practicing safe sex. Taking low doses of aspirin every day. Taking vitamin and mineral supplements as recommended by your health care provider. What happens during an annual well check? The services and screenings done by your health care provider during your annual well check will depend on your age, overall health, lifestyle risk factors, and family history of disease. Counseling  Your health care provider may ask you questions about your: Alcohol use. Tobacco use. Drug use. Emotional well-being. Home and relationship well-being. Sexual activity. Eating habits. History of falls. Memory and ability to understand (cognition). Work and work Statistician. Screening  You may have the following tests or measurements: Height, weight, and BMI. Blood pressure. Lipid and cholesterol levels. These may be  checked every 5 years, or more frequently if you are over 64 years old. Skin check. Lung cancer screening. You may have this screening every year starting at age 71 if you have a 30-pack-year history of smoking and currently smoke or have quit within the past 15 years. Fecal occult blood test (FOBT) of the stool. You may have this test every year starting at age 58. Flexible sigmoidoscopy or colonoscopy. You may have a sigmoidoscopy every 5 years or a colonoscopy every 10 years starting at age 58. Prostate cancer screening. Recommendations will vary depending on your family history and other risks. Hepatitis C blood test. Hepatitis B blood test. Sexually transmitted disease (STD) testing. Diabetes screening. This is done by checking your blood sugar (glucose) after you have not eaten for a while (fasting). You may have this done every 1-3 years. Abdominal aortic aneurysm (AAA) screening. You may need this if you are a current or former smoker. Osteoporosis. You may be screened  starting at age 2 if you are at high risk. Talk with your health care provider about your test results, treatment options, and if necessary, the need for more tests. Vaccines  Your health care provider may recommend certain vaccines, such as: Influenza vaccine. This is recommended every year. Tetanus, diphtheria, and acellular pertussis (Tdap, Td) vaccine. You may need a Td booster every 10 years. Zoster vaccine. You may need this after age 80. Pneumococcal 13-valent conjugate (PCV13) vaccine. One dose is recommended after age 74. Pneumococcal polysaccharide (PPSV23) vaccine. One dose is recommended after age 58. Talk to your health care provider about which screenings and vaccines you need and how often you need them. This information is not intended to replace advice given to you by your health care provider. Make sure you discuss any questions you have with your health care provider. Document Released: 10/25/2015  Document Revised: 06/17/2016 Document Reviewed: 07/30/2015 Elsevier Interactive Patient Education  2017 [place] Prevention in the Home Falls can cause injuries. They can happen to people of all ages. There are many things you can do to make your home safe and to help prevent falls. What can I do on the outside of my home? Regularly fix the edges of walkways and driveways and fix any cracks. Remove anything that might make you trip as you walk through a door, such as a raised step or threshold. Trim any bushes or trees on the path to your home. Use bright outdoor lighting. Clear any walking paths of anything that might make someone trip, such as rocks or tools. Regularly check to see if handrails are loose or broken. Make sure that both sides of any steps have handrails. Any raised decks and porches should have guardrails on the edges. Have any leaves, snow, or ice cleared regularly. Use sand or salt on walking paths during winter. Clean up any spills in your garage right away. This includes oil or grease spills. What can I do in the bathroom? Use night lights. Install grab bars by the toilet and in the tub and shower. Do not use towel bars as grab bars. Use non-skid mats or decals in the tub or shower. If you need to sit down in the shower, use a plastic, non-slip stool. Keep the floor dry. Clean up any water that spills on the floor as soon as it happens. Remove soap buildup in the tub or shower regularly. Attach bath mats securely with double-sided non-slip rug tape. Do not have throw rugs and other things on the floor that can make you trip. What can I do in the bedroom? Use night lights. Make sure that you have a light by your bed that is easy to reach. Do not use any sheets or blankets that are too big for your bed. They should not hang down onto the floor. Have a firm chair that has side arms. You can use this for support while you get dressed. Do not have throw rugs  and other things on the floor that can make you trip. What can I do in the kitchen? Clean up any spills right away. Avoid walking on wet floors. Keep items that you use a lot in easy-to-reach places. If you need to reach something above you, use a strong step stool that has a grab bar. Keep electrical cords out of the way. Do not use floor polish or wax that makes floors slippery. If you must use wax, use non-skid floor wax. Do not have throw  rugs and other things on the floor that can make you trip. What can I do with my stairs? Do not leave any items on the stairs. Make sure that there are handrails on both sides of the stairs and use them. Fix handrails that are broken or loose. Make sure that handrails are as long as the stairways. Check any carpeting to make sure that it is firmly attached to the stairs. Fix any carpet that is loose or worn. Avoid having throw rugs at the top or bottom of the stairs. If you do have throw rugs, attach them to the floor with carpet tape. Make sure that you have a light switch at the top of the stairs and the bottom of the stairs. If you do not have them, ask someone to add them for you. What else can I do to help prevent falls? Wear shoes that: Do not have high heels. Have rubber bottoms. Are comfortable and fit you well. Are closed at the toe. Do not wear sandals. If you use a stepladder: Make sure that it is fully opened. Do not climb a closed stepladder. Make sure that both sides of the stepladder are locked into place. Ask someone to hold it for you, if possible. Clearly mark and make sure that you can see: Any grab bars or handrails. First and last steps. Where the edge of each step is. Use tools that help you move around (mobility aids) if they are needed. These include: Canes. Walkers. Scooters. Crutches. Turn on the lights when you go into a dark area. Replace any light bulbs as soon as they burn out. Set up your furniture so you have a  clear path. Avoid moving your furniture around. If any of your floors are uneven, fix them. If there are any pets around you, be aware of where they are. Review your medicines with your doctor. Some medicines can make you feel dizzy. This can increase your chance of falling. Ask your doctor what other things that you can do to help prevent falls. This information is not intended to replace advice given to you by your health care provider. Make sure you discuss any questions you have with your health care provider. Document Released: 07/25/2009 Document Revised: 03/05/2016 Document Reviewed: 11/02/2014 Elsevier Interactive Patient Education  2017 [employer].

## 2023-01-06 NOTE — Progress Notes (Signed)
Subjective:   [name] is a 89 y.o. male who presents for Medicare Annual/Subsequent preventive examination. I connected with  [name] on 01/06/23 by a audio enabled telemedicine application and verified that I am speaking with the correct person using two identifiers.  Patient Location: Home  Provider Location: Home Office  I discussed the limitations of evaluation and management by telemedicine. The patient expressed understanding and agreed to proceed.  Review of Systems     Cardiac Risk Factors include: advanced age (>4men, >79 women);male gender;hypertension;dyslipidemia     Objective:    Today's Vitals   01/06/23 1329  Weight: 180 lb (81.6 kg)  Height: 5\' 7"  (1.702 m)   Body mass index is 28.19 kg/m.     01/06/2023    1:31 PM 12/21/2022    1:48 PM 12/21/2022   12:52 PM 11/30/2022   11:04 AM 11/30/2022   10:18 AM 11/11/2022   10:16 AM 11/09/2022    9:04 AM  Advanced Directives  Does Patient Have a Medical Advance Directive? Yes Yes Yes Yes Yes Yes Yes  Type of Paramedic of [place];Living will Living will;Healthcare Power of Attorney Living will;Healthcare Power of Attorney Living will;Healthcare Power of [place];Living will Living will;Healthcare Power of [place];Living will  Does patient want to make changes to medical advance directive?  No - Patient declined No - Patient declined No - Patient declined No - Patient declined No - Patient declined No - Patient declined  Copy of [place] in Chart? No - copy requested  No - copy requested  No - copy requested No - copy requested No - copy requested  Would patient like information on creating a medical advance directive?  No - Patient declined No - Patient declined No - Patient declined No - Patient declined No - Patient declined No - Patient declined    Current Medications (verified) Outpatient Encounter  Medications as of 01/06/2023  Medication Sig   acetaminophen (TYLENOL) 500 MG tablet Take 1,000 mg by mouth every 6 (six) hours as needed for moderate pain.   CARBOPLATIN IV Inject into the vein every 21 ( twenty-one) days.   cetirizine-pseudoephedrine (ZYRTEC-D) 5-120 MG tablet Take once per day as need for congestion/runny nose   Cholecalciferol (VITAMIN D3) 5000 units CAPS Take 5,000 Units by mouth once a week.    diltiazem (CARDIZEM CD) 120 MG 24 hr capsule Take one capsule daily   ezetimibe (ZETIA) 10 MG tablet Take 1 tablet (10 mg total) by mouth daily.   finasteride (PROSCAR) 5 MG tablet TAKE ONE (1) TABLET EACH DAY   fluticasone (FLONASE) 50 MCG/ACT nasal spray Place 1 spray into both nostrils daily as needed for allergies or rhinitis.   furosemide (LASIX) 20 MG tablet Take 1 tablet (20 mg total) by mouth daily.   lidocaine-prilocaine (EMLA) cream Apply a small amount to port a cath site and cover with plastic wrap 1 hour prior to chemotherapy appointments   lovastatin (MEVACOR) 20 MG tablet TAKE 1/2 TABLET EVERY OTHER DAY   metoprolol tartrate (LOPRESSOR) 100 MG tablet TAKE ONE (1) TABLET BY MOUTH TWO (2) TIMES DAILY   montelukast (SINGULAIR) 10 MG tablet Take 1 tablet (10 mg total) by mouth at bedtime.   PACLITAXEL IV Inject into the vein every 21 ( twenty-one) days.   PEMBROLIZUMAB IV Inject into the vein every 21 ( twenty-one) days.   prochlorperazine (COMPAZINE) 10 MG tablet Take  1 tablet (10 mg total) by mouth every 6 (six) hours as needed for nausea or vomiting.   tobramycin (TOBREX) 0.3 % ophthalmic solution    warfarin (COUMADIN) 5 MG tablet TAKE 1 TABLET ON SAT, SUN, TUES, & THURSTAKE 1/2 TABLET ON MON, WED, & FRIDAY   No facility-administered encounter medications on file as of 01/06/2023.    Allergies (verified) Penicillins, Hct [hydrochlorothiazide], and Statins   History: Past Medical History:  Diagnosis Date   Anemia    Atrial fibrillation (HCC)    Cataract     Cellulitis    In past   Complication of anesthesia    HARD TIME WAKING; CAUSES MY BP TO GO UP    Coronary artery disease    5 bypasses   DJD (degenerative joint disease)    Ejection fraction < 50%    Mildly reduced, 40% by echo   GERD (gastroesophageal reflux disease)    Hyperlipidemia    Hypertension    Persistent atrial fibrillation ([place])    Prostate hypertrophy    on CT scan 09/2014   PUD (peptic ulcer disease)    RESOLVED    Skin cancer of eyelid    Resected   Past Surgical History:  Procedure Laterality Date   APPENDECTOMY     BYPASS GRAFT  2005   CATARACT EXTRACTION W/PHACO Left 07/22/2015   Procedure: CATARACT EXTRACTION PHACO AND INTRAOCULAR LENS PLACEMENT LEFT EYE CDE=8.14;  Surgeon: Tonny Branch, MD;  Location: [facility];  Service: Ophthalmology;  Laterality: Left;   CATARACT EXTRACTION W/PHACO Right 08/18/2019   Procedure: CATARACT EXTRACTION PHACO AND INTRAOCULAR LENS PLACEMENT (IOC);  Surgeon: Baruch Goldmann, MD;  Location: [facility];  Service: Ophthalmology;  Laterality: Right;  CDE: 9.74   CORONARY ARTERY BYPASS GRAFT  4/05   LIMA to LAD, SVG to PDA, SVG to ramus intermediate   EYE SURGERY  07/2015   EYELID CARCINOMA EXCISION     Skin cancer resected   Heart bypass  2005   LYMPH NODE BIOPSY Right 06/09/2022   Procedure: LYMPH NODE BIOPSY; supraclavicular;  Surgeon: Virl Cagey, MD;  Location: [facility];  Service: General;  Laterality: Right;   PORTACATH PLACEMENT Left 06/09/2022   Procedure: INSERTION PORT-A-CATH;  Surgeon: Virl Cagey, MD;  Location: [facility];  Service: General;  Laterality: Left;   TOTAL HIP ARTHROPLASTY     Right   TOTAL HIP ARTHROPLASTY Left 05/04/2018   Procedure: LEFT TOTAL HIP ARTHROPLASTY;  Surgeon: [name], MD;  Location: [facility];  Service: Orthopedics;  Laterality: Left;   TOTAL KNEE ARTHROPLASTY     Right   Family History  Problem Relation Age of Onset   Stroke Mother    Stroke Father    Hypertension Father    Early  death Brother        72 months old   Cancer Brother        lung   Stroke Brother        heat   Social History   Socioeconomic History   Marital status: Married    Spouse name: [name]   Number of children: 1   Years of education: 10   Highest education level: 10th grade  Occupational History   Occupation: retired  Tobacco Use   Smoking status: Former    Packs/day: 2.00    Years: 5.00    Additional pack years: 0.00    Total pack years: 10.00    Types: Cigarettes    Quit date: 12/23/1963  Years since quitting: 59.0   Smokeless tobacco: Never  Vaping Use   Vaping Use: Never used  Substance and Sexual Activity   Alcohol use: No   Drug use: No   Sexual activity: Yes  Other Topics Concern   Not on file  Social History Narrative   Lives in a split level home.   Social Determinants of Health   Financial Resource Strain: Low Risk  (01/06/2023)   Overall Financial Resource Strain (CARDIA)    Difficulty of Paying Living Expenses: Not hard at all  Food Insecurity: No Food Insecurity (01/06/2023)   Hunger Vital Sign    Worried About Running Out of Food in the Last Year: Never true    Ran Out of Food in the Last Year: Never true  Transportation Needs: No Transportation Needs (01/06/2023)   PRAPARE - Hydrologist (Medical): No    Lack of Transportation (Non-Medical): No  Physical Activity: Insufficiently Active (01/06/2023)   Exercise Vital Sign    Days of Exercise per Week: 3 days    Minutes of Exercise per Session: 30 min  Stress: No Stress Concern Present (01/06/2023)   [place]    Feeling of Stress : Not at all  Social Connections: Moderately Isolated (01/06/2023)   Social Connection and Isolation Panel [NHANES]    Frequency of Communication with Friends and Family: More than three times a week    Frequency of Social Gatherings with Friends and Family: More than three times a  week    Attends Religious Services: Never    Marine scientist or Organizations: No    Attends Music therapist: Never    Marital Status: Married    Tobacco Counseling Counseling given: Not Answered   Clinical Intake:  Pre-visit preparation completed: Yes  Pain : No/denies pain     Nutritional Risks: None Diabetes: No  How often do you need to have someone help you when you read instructions, pamphlets, or other written materials from your doctor or pharmacy?: 1 - Never  Diabetic?no   Interpreter Needed?: No  Information entered by :: Jadene Pierini, LPN   Activities of Daily Living    01/06/2023    1:31 PM 06/05/2022    9:49 AM  In your present state of health, do you have any difficulty performing the following activities:  Hearing? 0 0  Vision? 0 0  Difficulty concentrating or making decisions? 0 0  Walking or climbing stairs? 0 1  Dressing or bathing? 0 0  Doing errands, shopping? 0   Preparing Food and eating ? N   Using the Toilet? N   In the past six months, have you accidently leaked urine? N   Do you have problems with loss of bowel control? N   Managing your Medications? N   Managing your Finances? N   Housekeeping or managing your Housekeeping? N     Patient Care Team: [name], Fransisca Kaufmann, MD as PCP - General (Family Medicine) Minus Breeding, MD as PCP - Cardiology (Cardiology) Irine Seal, MD as Attending Physician (Urology) Minus Breeding, MD as Consulting Physician (Cardiology) Rogene Houston, MD as Consulting Physician (Gastroenterology) Tonny Branch, MD as Consulting Physician (Ophthalmology) [name], MD as Consulting Physician (Orthopedic Surgery) Shea Evans, Norva Riffle, LCSW as [place] [facility] (Licensed Clinical Social Worker) Derek Jack, MD as Medical Oncologist (Medical Oncology) Brien Mates, RN as Oncology Nurse Navigator (Medical Oncology)  Indicate any recent Medical  Services you may have received from other than [facility] providers in the past year (date may be approximate).     Assessment:   This is a routine wellness examination for Hafiz.  Hearing/Vision screen Vision Screening - Comments:: Wears rx glasses - up to date with routine eye exams with  Dr.Johnson   Dietary issues and exercise activities discussed: Current Exercise Habits: Home exercise routine, Type of exercise: walking, Time (Minutes): 30, Frequency (Times/Week): 3, Weekly Exercise (Minutes/Week): 90, Intensity: Mild, Exercise limited by: orthopedic condition(s)   Goals Addressed             This Visit's Progress    DIET - INCREASE WATER INTAKE   On track    Try to drink 6-8 glasses of water daily       Depression Screen    01/06/2023    1:31 PM 11/27/2022    9:46 AM 10/16/2022    8:47 AM 08/24/2022   10:12 AM 07/02/2022   12:40 PM 05/22/2022    9:03 AM 05/01/2022    1:41 PM  PHQ 2/9 Scores  PHQ - 2 Score 0 0  0 0  0  PHQ- 9 Score 0 4  4 0  0  Exception Documentation   Patient refusal   Patient refusal     Fall Risk    01/06/2023    1:30 PM 11/27/2022    9:46 AM 08/24/2022   10:12 AM 07/02/2022   12:40 PM 05/22/2022    9:03 AM  Fall Risk   Falls in the past year? 0 0 0 0 0  Number falls in past yr: 0      Injury with Fall? 0      Risk for fall due to : No Fall Risks      Follow up Falls prevention discussed        [place]:  Any stairs in or around the home? Yes  If so, are there any without handrails? No  Home free of loose throw rugs in walkways, pet beds, electrical cords, etc? Yes  Adequate lighting in your home to reduce risk of falls? Yes   ASSISTIVE DEVICES UTILIZED TO PREVENT FALLS:  Life alert? No  Use of a cane, walker or w/c? No  Grab bars in the bathroom? Yes  Shower chair or bench in shower? Yes  Elevated toilet seat or a handicapped toilet? Yes       08/13/2016    8:41 AM 07/25/2015    8:31 AM  MMSE -  Mini Mental State Exam  Not completed: Refused   Orientation to time  5  Orientation to Place  5  Registration  3  Attention/ Calculation  3  Recall  3  Language- name 2 objects  2  Language- repeat  1  Language- follow 3 step command  3  Language- read & follow direction  1  Write a sentence  1  Copy design  1  Total score  28        01/06/2023    1:32 PM 12/11/2021    3:58 PM 12/10/2020    4:02 PM 11/21/2019    8:56 AM  6CIT Screen  What Year? 0 points 0 points 0 points 0 points  What month? 0 points 0 points 0 points 0 points  What time? 0 points 0 points 0 points 0 points  Count back from 20 0 points 0 points 0 points 0  points  Months in reverse 0 points 2 points 2 points 0 points  Repeat phrase 0 points 4 points 2 points 4 points  Total Score 0 points 6 points 4 points 4 points    Immunizations Immunization History  Administered Date(s) Administered   Fluad Quad(high Dose 65+) 07/19/2019, 07/19/2020, 08/13/2021, 07/02/2022   Influenza, High Dose Seasonal PF 07/07/2016, 07/21/2017, 07/08/2018   Influenza,inj,Quad PF,6+ Mos 07/17/2013, 07/05/2014, 07/17/2015   Moderna Sars-Covid-2 Vaccination 12/04/2019, 01/03/2020, 08/29/2020, 01/30/2021, 07/17/2021   Pneumococcal Conjugate-13 10/30/2013   Pneumococcal Polysaccharide-23 05/12/2010   Tdap 10/13/2011    TDAP status: Up to date  Flu Vaccine status: Up to date  Pneumococcal vaccine status: Up to date  Covid-19 vaccine status: Completed vaccines  Qualifies for Shingles Vaccine? Yes   Zostavax completed No   Shingrix Completed?: No.    Education has been provided regarding the importance of this vaccine. Patient has been advised to call insurance company to determine out of pocket expense if they have not yet received this vaccine. Advised may also receive vaccine at local pharmacy or Health Dept. Verbalized acceptance and understanding.  Screening Tests Health Maintenance  Topic Date Due   DTaP/Tdap/Td (2 - Td or  Tdap) 10/12/2021   COVID-19 Vaccine (6 - 2023-24 season) 06/12/2022   Zoster Vaccines- Shingrix (1 of 2) 02/15/2023 (Originally 08/31/1951)   Medicare Annual Wellness (AWV)  01/06/2024   Pneumonia Vaccine 29+ Years old  Completed   INFLUENZA VACCINE  Completed   HPV VACCINES  Aged Out    Health Maintenance  Health Maintenance Due  Topic Date Due   DTaP/Tdap/Td (2 - Td or Tdap) 10/12/2021   COVID-19 Vaccine (6 - 2023-24 season) 06/12/2022    Colorectal cancer screening: No longer required.   Lung Cancer Screening: (Low Dose CT Chest recommended if Age 17-80 years, 30 pack-year currently smoking OR have quit w/in 15years.) does not qualify.   Lung Cancer Screening Referral: n/a  Additional Screening:  Hepatitis C Screening: does not qualify;   Vision Screening: Recommended annual ophthalmology exams for early detection of glaucoma and other disorders of the eye. Is the patient up to date with their annual eye exam?  Yes  Who is the provider or what is the name of the office in which the patient attends annual eye exams? Dr.Johnson  If pt is not established with a provider, would they like to be referred to a provider to establish care? No .   Dental Screening: Recommended annual dental exams for proper oral hygiene  Community Resource Referral / Chronic Care Management: CRR required this visit?  No   CCM required this visit?  No      Plan:     I have personally reviewed and noted the following in the patient's chart:   Medical and social history Use of alcohol, tobacco or illicit drugs  Current medications and supplements including opioid prescriptions. Patient is not currently taking opioid prescriptions. Functional ability and status Nutritional status Physical activity Advanced directives List of other physicians Hospitalizations, surgeries, and ER visits in previous 12 months Vitals Screenings to include cognitive, depression, and falls Referrals and  appointments  In addition, I have reviewed and discussed with patient certain preventive protocols, quality metrics, and best practice recommendations. A written personalized care plan for preventive services as well as general preventive health recommendations were provided to patient.     Daphane Shepherd, LPN   [id number]   Nurse Notes: Due Tdap vaccine

## 2023-01-11 ENCOUNTER — Inpatient Hospital Stay: Payer: Medicare Other

## 2023-01-11 ENCOUNTER — Other Ambulatory Visit: Payer: Medicare Other

## 2023-01-11 ENCOUNTER — Inpatient Hospital Stay: Payer: Medicare Other | Attending: Hematology

## 2023-01-11 ENCOUNTER — Ambulatory Visit: Payer: Medicare Other | Admitting: Hematology

## 2023-01-11 ENCOUNTER — Ambulatory Visit: Payer: Medicare Other | Admitting: Family Medicine

## 2023-01-11 VITALS — BP 133/77 | HR 64 | Temp 97.9°F | Resp 18

## 2023-01-11 DIAGNOSIS — R918 Other nonspecific abnormal finding of lung field: Secondary | ICD-10-CM | POA: Insufficient documentation

## 2023-01-11 DIAGNOSIS — C760 Malignant neoplasm of head, face and neck: Secondary | ICD-10-CM | POA: Diagnosis not present

## 2023-01-11 DIAGNOSIS — C4442 Squamous cell carcinoma of skin of scalp and neck: Secondary | ICD-10-CM

## 2023-01-11 DIAGNOSIS — Z5112 Encounter for antineoplastic immunotherapy: Secondary | ICD-10-CM | POA: Diagnosis not present

## 2023-01-11 DIAGNOSIS — Z95828 Presence of other vascular implants and grafts: Secondary | ICD-10-CM

## 2023-01-11 LAB — COMPREHENSIVE METABOLIC PANEL
ALT: 14 U/L (ref 0–44)
AST: 17 U/L (ref 15–41)
Albumin: 3.7 g/dL (ref 3.5–5.0)
Alkaline Phosphatase: 67 U/L (ref 38–126)
Anion gap: 11 (ref 5–15)
BUN: 19 mg/dL (ref 8–23)
CO2: 25 mmol/L (ref 22–32)
Calcium: 8.9 mg/dL (ref 8.9–10.3)
Chloride: 103 mmol/L (ref 98–111)
Creatinine, Ser: 1.06 mg/dL (ref 0.61–1.24)
GFR, Estimated: >60 mL/min (ref >60)
Glucose, Bld: 97 mg/dL (ref 70–99)
Potassium: 4.1 mmol/L (ref 3.5–5.1)
Sodium: 139 mmol/L (ref 135–145)
Total Bilirubin: 0.7 mg/dL (ref 0.3–1.2)
Total Protein: 7.1 g/dL (ref 6.5–8.1)

## 2023-01-11 LAB — CBC WITH DIFFERENTIAL/PLATELET
Abs Immature Granulocytes: 0.04 10*3/uL (ref 0.00–0.07)
Basophils Absolute: 0.1 10*3/uL (ref 0.0–0.1)
Basophils Relative: 0 %
Eosinophils Absolute: 1.1 10*3/uL — ABNORMAL HIGH (ref 0.0–0.5)
Eosinophils Relative: 10 %
HCT: 39.9 % (ref 39.0–52.0)
Hemoglobin: 13.2 g/dL (ref 13.0–17.0)
Immature Granulocytes: 0 %
Lymphocytes Relative: 39 %
Lymphs Abs: 4.5 10*3/uL — ABNORMAL HIGH (ref 0.7–4.0)
MCH: 35.2 pg — ABNORMAL HIGH (ref 26.0–34.0)
MCHC: 33.1 g/dL (ref 30.0–36.0)
MCV: 106.4 fL — ABNORMAL HIGH (ref 80.0–100.0)
Monocytes Absolute: 0.7 10*3/uL (ref 0.1–1.0)
Monocytes Relative: 6 %
Neutro Abs: 5.2 10*3/uL (ref 1.7–7.7)
Neutrophils Relative %: 45 %
Platelets: 182 10*3/uL (ref 150–400)
RBC: 3.75 MIL/uL — ABNORMAL LOW (ref 4.22–5.81)
RDW: 12.4 % (ref 11.5–15.5)
WBC: 11.6 10*3/uL — ABNORMAL HIGH (ref 4.0–10.5)
nRBC: 0 % (ref 0.0–0.2)

## 2023-01-11 LAB — MAGNESIUM: Magnesium: 2 mg/dL (ref 1.7–2.4)

## 2023-01-11 MED ORDER — SODIUM CHLORIDE 0.9 % IV SOLN
200.0000 mg | Freq: Once | INTRAVENOUS | Status: AC
  Administered 2023-01-11: 200 mg via INTRAVENOUS
  Filled 2023-01-11: qty 8

## 2023-01-11 MED ORDER — SODIUM CHLORIDE 0.9 % IV SOLN: Freq: Once | INTRAVENOUS | Status: AC

## 2023-01-11 MED ORDER — SODIUM CHLORIDE 0.9% FLUSH
10.0000 mL | Freq: Once | INTRAVENOUS | Status: AC
  Administered 2023-01-11: 10 mL via INTRAVENOUS

## 2023-01-11 MED ORDER — HEPARIN SOD (PORK) LOCK FLUSH 100 UNIT/ML IV SOLN
500.0000 [IU] | Freq: Once | INTRAVENOUS | Status: AC | PRN
  Administered 2023-01-11: 500 [IU]

## 2023-01-11 MED ORDER — SODIUM CHLORIDE 0.9% FLUSH
10.0000 mL | INTRAVENOUS | Status: DC | PRN
  Administered 2023-01-11: 10 mL

## 2023-01-11 NOTE — Patient Instructions (Signed)
[facility]  Discharge Instructions: Thank you for choosing [facility] to provide your oncology and hematology care.  If you have a lab appointment with the Cancer Center, please come in thru the Main Entrance and check in at the main information desk.  Wear comfortable clothing and clothing appropriate for easy access to any Portacath or PICC line.   We strive to give you quality time with your provider. You may need to reschedule your appointment if you arrive late (15 or more minutes).  Arriving late affects you and other patients whose appointments are after yours.  Also, if you miss three or more appointments without notifying the office, you may be dismissed from the clinic at the provider's discretion.      For prescription refill requests, have your pharmacy contact our office and allow 72 hours for refills to be completed.    Today you received the following chemotherapy and/or immunotherapy agents Keytruda      To help prevent nausea and vomiting after your treatment, we encourage you to take your nausea medication as directed.  BELOW ARE SYMPTOMS THAT SHOULD BE REPORTED IMMEDIATELY: *FEVER GREATER THAN 100.4 F (38 C) OR HIGHER *CHILLS OR SWEATING *NAUSEA AND VOMITING THAT IS NOT CONTROLLED WITH YOUR NAUSEA MEDICATION *UNUSUAL SHORTNESS OF BREATH *UNUSUAL BRUISING OR BLEEDING *URINARY PROBLEMS (pain or burning when urinating, or frequent urination) *BOWEL PROBLEMS (unusual diarrhea, constipation, pain near the anus) TENDERNESS IN MOUTH AND THROAT WITH OR WITHOUT PRESENCE OF ULCERS (sore throat, sores in mouth, or a toothache) UNUSUAL RASH, SWELLING OR PAIN  UNUSUAL VAGINAL DISCHARGE OR ITCHING   Items with * indicate a potential emergency and should be followed up as soon as possible or go to the Emergency Department if any problems should occur.  Please show the CHEMOTHERAPY ALERT CARD or IMMUNOTHERAPY ALERT CARD at check-in to the  Emergency Department and triage nurse.  Should you have questions after your visit or need to cancel or reschedule your appointment, please contact [facility] [phone]  and follow the prompts.  Office hours are 8:00 a.m. to 4:30 p.m. Monday - Friday. Please note that voicemails left after 4:00 p.m. may not be returned until the following business day.  We are closed weekends and major holidays. You have access to a nurse at all times for urgent questions. Please call the main number to the clinic [phone] and follow the prompts.  For any non-urgent questions, you may also contact your provider using MyChart. We now offer e-Visits for anyone 18 and older to request care online for non-urgent symptoms. For details visit [email or web address].   Also download the MyChart app! Go to the app store, search "MyChart", open the app, select [facility], and log in with your MyChart username and password.   

## 2023-01-11 NOTE — Progress Notes (Signed)
Patient presents today for Keytruda infusion per providers order.  Vital signs and labs within parameters for treatment.  Patient has no new complaints at this time.  Treatment given today per MD orders.  Stable during infusion without adverse affects.  Vital signs stable.  No complaints at this time.  Discharge from clinic ambulatory in stable condition.  Alert and oriented X 3.  Follow up with [facility] as scheduled.  

## 2023-01-11 NOTE — Progress Notes (Signed)
Patients port flushed without difficulty.  Good blood return noted with no bruising or swelling noted at site.  Stable during access and blood draw.  Patient to remain accessed for treatment. 

## 2023-01-12 ENCOUNTER — Other Ambulatory Visit: Payer: Self-pay

## 2023-01-31 NOTE — Progress Notes (Signed)
[facility] [address], [place] [address]    Clinic Day:  02/01/2023  Referring physician: [name], Elige Radon, MD  Patient Care Team: [name], Elige Radon, MD as PCP - General (Family Medicine) [name], MD as PCP - Cardiology (Cardiology) Bjorn Pippin, MD as Attending Physician (Urology) [name], MD as Consulting Physician (Cardiology) [name], MD as Consulting Physician (Gastroenterology) [name], MD as Consulting Physician (Ophthalmology) [name], MD as Consulting Physician (Orthopedic Surgery) [name], [name], LCSW as [facility] Care Management (Licensed Clinical Social Worker) [name], MD as Medical Oncologist (Medical Oncology) [name], RN as Oncology Nurse Navigator (Medical Oncology)   ASSESSMENT & PLAN:   Assessment: 1.  Right supraclavicular lymphadenopathy and multiple lung nodules: - Patient noticed right neck mass since Christmas 2022.  10 to 15 pound weight loss since January 2023.  Denies any dysphagia or odynophagia. - CT neck (05/27/2022): Numerous lymph nodes in the right neck, right supraclavicular lymph node mass measures 5.7 x 3.7 cm.  18 mm posterior lymph node in the right lower neck.  Numerous lymph nodes in the right lower and posterior neck approximately 1 cm.  Many have internal necrosis consistent with metastatic disease.  20 mm lymph node just above the right clavicle.  Left level 2 node 12 mm. - CT CAP (05/28/2022): Several bilateral lung nodules, RUL nodule measuring 1.8 x 1.3 cm.  Small clusters of nodules in the medial left upper lobe.  Irregular nodular density along the left side of the mediastinum measuring 2.1 cm.  No metastatic disease in the abdomen or pelvis.  Liver is slightly nodular contour. - Lab work shows elevated white count since 2009, predominantly lymphocytosis. - Pathology (06/09/2022): Metastatic carcinoma positive for p40, p63 and  CK5/6-patchy positivity with CK7.  Negative for TTF-1, Napsin A, PAX8, prostein, PSA, CK20 and CDX2.  - PET scan (06/11/2022): Bulky right supraclavicular nodal mass, smaller hypermetabolic right posterior triangle and jugular lymph nodes and single hypermetabolic left level 3 lymph node.  Bilateral hypermetabolic pulmonary nodules and metastatic pattern.  Minimal hypermetabolic mediastinal nodal metastasis.  Cluster of hypermetabolic right axillary lymph nodes.  No evidence of metastatic disease or primary lesion in the abdomen or pelvis.  No bone lesions. - MRI of the brain: Chronic small vessel ischemic changes with no evidence of metastatic disease. - NGS: T p53 pathogenic variant, MSI-stable, TMB-low, LOH-low, PD-L1 ([id number]): Negative, p16 and p18 negative - Cancer type ID: 96% probability squamous cell carcinoma, subtype head and neck/skin.  Cancer types ruled out with 95% confidence includes skin basal cell carcinoma. - Carboplatin, Taxol and pembrolizumab 07/23/2022 through 11/09/22   2.  Social/family history: - He lives at home with his wife.  Independent of ADLs and IADLs.  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  May have had asbestos exposure 30 years ago.  Quit smoking cigarettes in 1965.  Smoked 1 pack/day for less than 12 years.  2 of his brothers had lung cancer and both were smokers.   Plan: 1.  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 - He does not have any major immunotherapy related side effects. - He reported some swelling of the breasts and tenderness.  I have reviewed his medications.  He is not sure if he is taking finasteride which can cause gynecomastia. - Rash on forearms has improved.  Itching present.  Use moisturizing lotion. - I will  check FSH, LH levels today. - I have reviewed labs today: Normal LFTs and CBC.  Proceed with treatment without any modifications. - RTC 3 weeks for follow-up.  Will plan on repeating CT neck and chest prior to next visit.   2.  Neuropathy: - Leg pains and neuropathy improved since paclitaxel discontinued. - He reports some joint pains in the knees, ankles and wrists.  He is taking Tylenol 4 times daily which is helping.  He cannot tolerate NSAIDs.  Orders Placed This Encounter  Procedures   CT Chest W Contrast    Standing Status:   Future    Standing Expiration Date:   01/31/2024    Order Specific Question:   If indicated for the ordered procedure, I authorize the administration of contrast media per Radiology protocol    Answer:   Yes    Order Specific Question:   Does the patient have a contrast media/X-ray dye allergy?    Answer:   No    Order Specific Question:   Preferred imaging location?    Answer:   [facility]   CT SOFT TISSUE NECK W CONTRAST    Standing Status:   Future    Standing Expiration Date:   01/31/2024    Order Specific Question:   If indicated for the ordered procedure, I authorize the administration of contrast media per Radiology protocol    Answer:   Yes    Order Specific Question:   Does the patient have a contrast media/X-ray dye allergy?    Answer:   No    Order Specific Question:   Preferred imaging location?    Answer:   [facility]   Magnesium    Standing Status:   Future    Standing Expiration Date:   05/16/2024   CBC with Differential    Standing Status:   Future    Standing Expiration Date:   05/16/2024   Comprehensive metabolic panel    Standing Status:   Future    Standing Expiration Date:   05/16/2024   Magnesium    Standing Status:   Future    Standing Expiration Date:   06/06/2024   CBC with Differential    Standing Status:   Future    Standing Expiration Date:   06/06/2024   Comprehensive metabolic panel    Standing Status:    Future    Standing Expiration Date:   06/06/2024   Follicle stimulating hormone   Luteinizing hormone   Cortisol      I,Katie Daubenspeck,acting as a scribe for [name], MD.,have documented all relevant documentation on the behalf of [name], MD,as directed by  [name], MD while in the presence of [name], MD.   I, [name] MD, have reviewed the above documentation for accuracy and completeness, and I agree with the above.    [name], MD   4/22/202411:33 AM  CHIEF COMPLAINT:   Diagnosis: poorly differentiated squamous cell carcinoma    Cancer Staging  Squamous cell carcinoma of head and neck Staging form: Cervical Lymph Nodes and Unknown Primary Tumors of the Head and Neck, AJCC 8th Edition - Clinical stage from 07/16/2022: Stage IVC (cT0, cN3b, cM1) - Unsigned    Prior Therapy: Carboplatin, paclitaxel and pembrolizumab   Current Therapy:  pembrolizumab alone  HISTORY OF PRESENT ILLNESS:   Oncology History  Squamous cell carcinoma of head and neck  07/16/2022 Initial Diagnosis   Squamous cell carcinoma of head and neck (HCC)   07/23/2022 -  Chemotherapy   Patient is on Treatment Plan : Carboplatin  + Paclitaxel  + Pembrolizumab (200) D1 q21d        INTERVAL HISTORY:   [name] is a 89 y.o. male presenting to clinic today for follow up of poorly differentiated squamous cell carcinoma. He was last seen by me on 12/21/22.  Today, he states that he is doing well overall. His appetite level is at 100%. His energy level is at 10%.  PAST MEDICAL HISTORY:   Past Medical History: Past Medical History:  Diagnosis Date   Anemia    Atrial fibrillation (HCC)    Cataract    Cellulitis    In past   Complication of anesthesia    HARD TIME WAKING; CAUSES MY BP TO GO UP    Coronary artery disease    5 bypasses   DJD (degenerative joint disease)    Ejection fraction < 50%    Mildly reduced, 40% by echo    GERD (gastroesophageal reflux disease)    Hyperlipidemia    Hypertension    Persistent atrial fibrillation (HCC)    Prostate hypertrophy    on CT scan 09/2014   PUD (peptic ulcer disease)    RESOLVED    Skin cancer of eyelid    Resected    Surgical History: Past Surgical History:  Procedure Laterality Date   APPENDECTOMY     BYPASS GRAFT  2005   CATARACT EXTRACTION W/PHACO Left 07/22/2015   Procedure: CATARACT EXTRACTION PHACO AND INTRAOCULAR LENS PLACEMENT LEFT EYE CDE=8.14;  Surgeon: [name], MD;  Location: [facility];  Service: Ophthalmology;  Laterality: Left;   CATARACT EXTRACTION W/PHACO Right 08/18/2019   Procedure: CATARACT EXTRACTION PHACO AND INTRAOCULAR LENS PLACEMENT (IOC);  Surgeon: Fabio Pierce, MD;  Location: [facility];  Service: Ophthalmology;  Laterality: Right;  CDE: 9.74   CORONARY ARTERY BYPASS GRAFT  4/05   LIMA to LAD, SVG to PDA, SVG to ramus intermediate   EYE SURGERY  07/2015   EYELID CARCINOMA EXCISION     Skin cancer resected   Heart bypass  2005   LYMPH NODE BIOPSY Right 06/09/2022   Procedure: LYMPH NODE BIOPSY; supraclavicular;  Surgeon: Lucretia Roers, MD;  Location: [facility];  Service: General;  Laterality: Right;   PORTACATH PLACEMENT Left 06/09/2022   Procedure: INSERTION PORT-A-CATH;  Surgeon: Lucretia Roers, MD;  Location: [facility];  Service: General;  Laterality: Left;   TOTAL HIP ARTHROPLASTY     Right   TOTAL HIP ARTHROPLASTY Left 05/04/2018   Procedure: LEFT TOTAL HIP ARTHROPLASTY;  Surgeon: [name], MD;  Location: [facility];  Service: Orthopedics;  Laterality: Left;   TOTAL KNEE ARTHROPLASTY     Right    Social History: Social History   Socioeconomic History   Marital status: Married    Spouse name: Talbert Forest   Number of children: 1   Years of education: 10   Highest education level: 10th grade  Occupational History   Occupation: retired  Tobacco Use   Smoking status: Former    Packs/day: 2.00    Years: 5.00     Additional pack years: 0.00    Total pack years: 10.00    Types: Cigarettes    Quit date: 12/23/1963    Years since quitting: 68.1  Smokeless tobacco: Never  Vaping Use   Vaping Use: Never used  Substance and Sexual Activity   Alcohol use: No   Drug use: No   Sexual activity: Yes  Other Topics Concern   Not on file  Social History Narrative   Lives in a split level home.   Social Determinants of Health   Financial Resource Strain: Low Risk  (01/31/2023)   Overall Financial Resource Strain (CARDIA)    Difficulty of Paying Living Expenses: Not hard at all  Food Insecurity: No Food Insecurity (01/31/2023)   Hunger Vital Sign    Worried About Running Out of Food in the Last Year: Never true    Ran Out of Food in the Last Year: Never true  Transportation Needs: No Transportation Needs (01/31/2023)   PRAPARE - Administrator, Civil Service (Medical): No    Lack of Transportation (Non-Medical): No  Physical Activity: Inactive (01/31/2023)   Exercise Vital Sign    Days of Exercise per Week: 0 days    Minutes of Exercise per Session: 30 min  Stress: No Stress Concern Present (01/31/2023)   [employer] of Occupational Health - Occupational Stress Questionnaire    Feeling of Stress : Only a little  Social Connections: Socially Isolated (01/31/2023)   Social Connection and Isolation Panel [NHANES]    Frequency of Communication with Friends and Family: Once a week    Frequency of Social Gatherings with Friends and Family: Once a week    Attends Religious Services: Never    Database administrator or Organizations: No    Attends Banker Meetings: Never    Marital Status: Married  Catering manager Violence: Not At Risk (01/06/2023)   Humiliation, Afraid, Rape, and Kick questionnaire    Fear of Current or Ex-Partner: No    Emotionally Abused: No    Physically Abused: No    Sexually Abused: No    Family History: Family History  Problem Relation Age of Onset    Stroke Mother    Stroke Father    Hypertension Father    Early death Brother        77 months old   Cancer Brother        lung   Stroke Brother        heat    Current Medications:  Current Outpatient Medications:    acetaminophen (TYLENOL) 500 MG tablet, Take 1,000 mg by mouth every 6 (six) hours as needed for moderate pain., Disp: , Rfl:    CARBOPLATIN IV, Inject into the vein every 21 ( twenty-one) days., Disp: , Rfl:    cetirizine-pseudoephedrine (ZYRTEC-D) 5-120 MG tablet, Take once per day as need for congestion/runny nose, Disp: 10 tablet, Rfl: 0   Cholecalciferol (VITAMIN D3) 5000 units CAPS, Take 5,000 Units by mouth once a week. , Disp: , Rfl:    diltiazem (CARDIZEM CD) 120 MG 24 hr capsule, Take one capsule daily, Disp: 90 capsule, Rfl: 3   ezetimibe (ZETIA) 10 MG tablet, Take 1 tablet (10 mg total) by mouth daily., Disp: 90 tablet, Rfl: 3   finasteride (PROSCAR) 5 MG tablet, TAKE ONE (1) TABLET EACH DAY, Disp: 90 tablet, Rfl: 3   fluticasone (FLONASE) 50 MCG/ACT nasal spray, Place 1 spray into both nostrils daily as needed for allergies or rhinitis., Disp: , Rfl:    furosemide (LASIX) 20 MG tablet, Take 1 tablet (20 mg total) by mouth daily., Disp: 90 tablet, Rfl: 3   lovastatin (MEVACOR) 20  MG tablet, TAKE 1/2 TABLET EVERY OTHER DAY, Disp: 23 tablet, Rfl: 3   metoprolol tartrate (LOPRESSOR) 100 MG tablet, TAKE ONE (1) TABLET BY MOUTH TWO (2) TIMES DAILY, Disp: 180 tablet, Rfl: 3   montelukast (SINGULAIR) 10 MG tablet, Take 1 tablet (10 mg total) by mouth at bedtime., Disp: 90 tablet, Rfl: 3   PACLITAXEL IV, Inject into the vein every 21 ( twenty-one) days., Disp: , Rfl:    PEMBROLIZUMAB IV, Inject into the vein every 21 ( twenty-one) days., Disp: , Rfl:    tobramycin (TOBREX) 0.3 % ophthalmic solution, , Disp: , Rfl:    warfarin (COUMADIN) 5 MG tablet, TAKE 1 TABLET ON SAT, SUN, TUES, & THURSTAKE 1/2 TABLET ON MON, WED, & FRIDAY, Disp: 70 tablet, Rfl: 3    lidocaine-prilocaine (EMLA) cream, Apply a small amount to port a cath site and cover with plastic wrap 1 hour prior to chemotherapy appointments (Patient not taking: Reported on 02/01/2023), Disp: 30 g, Rfl: 3   prochlorperazine (COMPAZINE) 10 MG tablet, Take 1 tablet (10 mg total) by mouth every 6 (six) hours as needed for nausea or vomiting. (Patient not taking: Reported on 02/01/2023), Disp: 60 tablet, Rfl: 3   Allergies: Allergies  Allergen Reactions   Penicillins Hives and Rash    Has patient had a PCN reaction causing immediate rash, facial/tongue/throat swelling, SOB or lightheadedness with hypotension: Yes Has patient had a PCN reaction causing severe rash involving mucus membranes or skin necrosis: Yes Has patient had a PCN reaction that required hospitalization: No Has patient had a PCN reaction occurring within the last 10 years: No If all of the above answers are "NO", then may proceed with Cephalosporin use.    Hct [Hydrochlorothiazide] Other (See Comments)    hyper   Statins Other (See Comments)    Myalgia, tolerates low dosages of lovastatin     REVIEW OF SYSTEMS:   Review of Systems  Constitutional:  Positive for fatigue. Negative for chills and fever.  HENT:   Negative for lump/mass, mouth sores, nosebleeds, sore throat and trouble swallowing.   Eyes:  Negative for eye problems.  Respiratory:  Positive for cough. Negative for shortness of breath.   Cardiovascular:  Positive for chest pain (Soreness in the chest wall). Negative for leg swelling and palpitations.  Gastrointestinal:  Positive for constipation. Negative for abdominal pain, diarrhea, nausea and vomiting.  Genitourinary:  Negative for bladder incontinence, difficulty urinating, dysuria, frequency, hematuria and nocturia.   Musculoskeletal:  Negative for arthralgias, back pain, flank pain, myalgias and neck pain.  Skin:  Negative for itching and rash.  Neurological:  Positive for dizziness and numbness.  Negative for headaches.  Hematological:  Does not bruise/bleed easily.  Psychiatric/Behavioral:  Negative for depression, sleep disturbance and suicidal ideas. The patient is not nervous/anxious.   All other systems reviewed and are negative.    VITALS:   There were no vitals taken for this visit.  Wt Readings from Last 3 Encounters:  02/01/23 185 lb 12.8 oz (84.3 kg)  01/11/23 187 lb (84.8 kg)  01/06/23 180 lb (81.6 kg)    There is no height or weight on file to calculate BMI.  Performance status (ECOG): 1 - Symptomatic but completely ambulatory  PHYSICAL EXAM:   Physical Exam Vitals and nursing note reviewed. Exam conducted with a chaperone present.  Constitutional:      Appearance: Normal appearance.  Cardiovascular:     Rate and Rhythm: Normal rate and regular rhythm.  Pulses: Normal pulses.     Heart sounds: Normal heart sounds.  Pulmonary:     Effort: Pulmonary effort is normal.     Breath sounds: Normal breath sounds.  Abdominal:     Palpations: Abdomen is soft. There is no hepatomegaly, splenomegaly or mass.     Tenderness: There is no abdominal tenderness.  Musculoskeletal:     Right lower leg: No edema.     Left lower leg: No edema.  Lymphadenopathy:     Cervical: No cervical adenopathy.     Right cervical: No superficial, deep or posterior cervical adenopathy.    Left cervical: No superficial, deep or posterior cervical adenopathy.     Upper Body:     Right upper body: No supraclavicular or axillary adenopathy.     Left upper body: No supraclavicular or axillary adenopathy.  Neurological:     General: No focal deficit present.     Mental Status: He is alert and oriented to person, place, and time.  Psychiatric:        Mood and Affect: Mood normal.        Behavior: Behavior normal.     LABS:      Latest Ref Rng & Units 02/01/2023   10:37 AM 01/11/2023   11:22 AM 12/21/2022   11:55 AM  CBC  WBC 4.0 - 10.5 K/uL 12.0  11.6  10.8   Hemoglobin 13.0 -  17.0 g/dL 16.1  09.6  04.5   Hematocrit 39.0 - 52.0 % 40.2  39.9  38.6   Platelets 150 - 400 K/uL 180  182  202       Latest Ref Rng & Units 02/01/2023   10:37 AM 01/11/2023   11:22 AM 12/21/2022   11:55 AM  CMP  Glucose 70 - 99 mg/dL 91  97  96   BUN 8 - 23 mg/dL 19  19  17    Creatinine 0.61 - 1.24 mg/dL 4.09  8.11  9.14   Sodium 135 - 145 mmol/L 139  139  137   Potassium 3.5 - 5.1 mmol/L 4.0  4.1  4.0   Chloride 98 - 111 mmol/L 104  103  105   CO2 22 - 32 mmol/L 27  25  26    Calcium 8.9 - 10.3 mg/dL 8.9  8.9  8.5   Total Protein 6.5 - 8.1 g/dL 6.9  7.1  6.6   Total Bilirubin 0.3 - 1.2 mg/dL 0.7  0.7  0.5   Alkaline Phos 38 - 126 U/L 61  67  67   AST 15 - 41 U/L 17  17  19    ALT 0 - 44 U/L 13  14  16       Lab Results  Component Value Date   CEA1 5.4 (H) 06/03/2022   /  CEA  Date Value Ref Range Status  06/03/2022 5.4 (H) 0.0 - 4.7 ng/mL Final    Comment:    (NOTE)                             Nonsmokers          <3.9                             Smokers             <5.6 Roche [employer] Electrochemiluminescence Immunoassay (ECLIA) Values obtained with different assay methods or kits  cannot be used interchangeably.  Results cannot be interpreted as absolute evidence of the presence or absence of malignant disease. Performed At: [facility] [address] [place], [place] [id number] Jolene Schimke MD MV:[id number]    Lab Results  Component Value Date   PSA1 11.3 (H) 02/16/2022   Lab Results  Component Value Date   CAN199 31 (H) 06/03/2022   No results found for: "CAN125"  No results found for: "TOTALPROTELP", "ALBUMINELP", "A1GS", "A2GS", "BETS", "BETA2SER", "GAMS", "MSPIKE", "SPEI" No results found for: "TIBC", "FERRITIN", "IRONPCTSAT" Lab Results  Component Value Date   LDH 164 06/03/2022     STUDIES:   No results found.

## 2023-02-01 ENCOUNTER — Inpatient Hospital Stay: Payer: Medicare Other

## 2023-02-01 ENCOUNTER — Inpatient Hospital Stay (HOSPITAL_BASED_OUTPATIENT_CLINIC_OR_DEPARTMENT_OTHER): Payer: Medicare Other | Admitting: Hematology

## 2023-02-01 VITALS — BP 135/86 | HR 65 | Temp 97.4°F | Resp 18

## 2023-02-01 DIAGNOSIS — Z95828 Presence of other vascular implants and grafts: Secondary | ICD-10-CM

## 2023-02-01 DIAGNOSIS — Z5112 Encounter for antineoplastic immunotherapy: Secondary | ICD-10-CM | POA: Diagnosis not present

## 2023-02-01 DIAGNOSIS — C4442 Squamous cell carcinoma of skin of scalp and neck: Secondary | ICD-10-CM

## 2023-02-01 DIAGNOSIS — R918 Other nonspecific abnormal finding of lung field: Secondary | ICD-10-CM | POA: Diagnosis not present

## 2023-02-01 DIAGNOSIS — C760 Malignant neoplasm of head, face and neck: Secondary | ICD-10-CM | POA: Diagnosis not present

## 2023-02-01 LAB — CBC WITH DIFFERENTIAL/PLATELET
Abs Immature Granulocytes: 0.03 10*3/uL (ref 0.00–0.07)
Basophils Absolute: 0.1 10*3/uL (ref 0.0–0.1)
Basophils Relative: 0 %
Eosinophils Absolute: 1.2 10*3/uL — ABNORMAL HIGH (ref 0.0–0.5)
Eosinophils Relative: 10 %
HCT: 40.2 % (ref 39.0–52.0)
Hemoglobin: 13.3 g/dL (ref 13.0–17.0)
Immature Granulocytes: 0 %
Lymphocytes Relative: 36 %
Lymphs Abs: 4.3 10*3/uL — ABNORMAL HIGH (ref 0.7–4.0)
MCH: 34.8 pg — ABNORMAL HIGH (ref 26.0–34.0)
MCHC: 33.1 g/dL (ref 30.0–36.0)
MCV: 105.2 fL — ABNORMAL HIGH (ref 80.0–100.0)
Monocytes Absolute: 0.8 10*3/uL (ref 0.1–1.0)
Monocytes Relative: 7 %
Neutro Abs: 5.7 10*3/uL (ref 1.7–7.7)
Neutrophils Relative %: 47 %
Platelets: 180 10*3/uL (ref 150–400)
RBC: 3.82 MIL/uL — ABNORMAL LOW (ref 4.22–5.81)
RDW: 12 % (ref 11.5–15.5)
WBC: 12 10*3/uL — ABNORMAL HIGH (ref 4.0–10.5)
nRBC: 0 % (ref 0.0–0.2)

## 2023-02-01 LAB — COMPREHENSIVE METABOLIC PANEL
ALT: 13 U/L (ref 0–44)
AST: 17 U/L (ref 15–41)
Albumin: 3.7 g/dL (ref 3.5–5.0)
Alkaline Phosphatase: 61 U/L (ref 38–126)
Anion gap: 8 (ref 5–15)
BUN: 19 mg/dL (ref 8–23)
CO2: 27 mmol/L (ref 22–32)
Calcium: 8.9 mg/dL (ref 8.9–10.3)
Chloride: 104 mmol/L (ref 98–111)
Creatinine, Ser: 0.97 mg/dL (ref 0.61–1.24)
GFR, Estimated: >60 mL/min (ref >60)
Glucose, Bld: 91 mg/dL (ref 70–99)
Potassium: 4 mmol/L (ref 3.5–5.1)
Sodium: 139 mmol/L (ref 135–145)
Total Bilirubin: 0.7 mg/dL (ref 0.3–1.2)
Total Protein: 6.9 g/dL (ref 6.5–8.1)

## 2023-02-01 LAB — CORTISOL: Cortisol, Plasma: 8.5 ug/dL

## 2023-02-01 LAB — MAGNESIUM: Magnesium: 2.1 mg/dL (ref 1.7–2.4)

## 2023-02-01 MED ORDER — HEPARIN SOD (PORK) LOCK FLUSH 100 UNIT/ML IV SOLN
500.0000 [IU] | Freq: Once | INTRAVENOUS | Status: AC | PRN
  Administered 2023-02-01: 500 [IU]

## 2023-02-01 MED ORDER — SODIUM CHLORIDE 0.9% FLUSH
10.0000 mL | INTRAVENOUS | Status: DC | PRN
  Administered 2023-02-01: 10 mL

## 2023-02-01 MED ORDER — SODIUM CHLORIDE 0.9% FLUSH
10.0000 mL | Freq: Once | INTRAVENOUS | Status: AC
  Administered 2023-02-01: 10 mL via INTRAVENOUS

## 2023-02-01 MED ORDER — SODIUM CHLORIDE 0.9 % IV SOLN: Freq: Once | INTRAVENOUS | Status: AC

## 2023-02-01 MED ORDER — SODIUM CHLORIDE 0.9 % IV SOLN
200.0000 mg | Freq: Once | INTRAVENOUS | Status: AC
  Administered 2023-02-01: 200 mg via INTRAVENOUS
  Filled 2023-02-01: qty 8

## 2023-02-01 NOTE — Patient Instructions (Signed)
[facility]  Discharge Instructions: Thank you for choosing [facility] to provide your oncology and hematology care.  If you have a lab appointment with the Cancer Center - please note that after April 8th, 2024, all labs will be drawn in the cancer center.  You do not have to check in or register with the main entrance as you have in the past but will complete your check-in in the cancer center.  Wear comfortable clothing and clothing appropriate for easy access to any Portacath or PICC line.   We strive to give you quality time with your provider. You may need to reschedule your appointment if you arrive late (15 or more minutes).  Arriving late affects you and other patients whose appointments are after yours.  Also, if you miss three or more appointments without notifying the office, you may be dismissed from the clinic at the provider's discretion.      For prescription refill requests, have your pharmacy contact our office and allow 72 hours for refills to be completed.    Today you received the following chemotherapy and/or immunotherapy agents Keytruda   To help prevent nausea and vomiting after your treatment, we encourage you to take your nausea medication as directed.  Pembrolizumab Injection What is this medication? PEMBROLIZUMAB (PEM broe LIZ ue mab) treats some types of cancer. It works by helping your immune system slow or stop the spread of cancer cells. It is a monoclonal antibody. This medicine may be used for other purposes; ask your health care provider or pharmacist if you have questions. COMMON BRAND NAME(S): Keytruda What should I tell my care team before I take this medication? They need to know if you have any of these conditions: Allogeneic stem cell transplant (uses someone else's stem cells) Autoimmune diseases, such as Crohn disease, ulcerative colitis, lupus History of chest radiation Nervous system problems, such as  Guillain-Barre syndrome, myasthenia gravis Organ transplant An unusual or allergic reaction to pembrolizumab, other medications, foods, dyes, or preservatives Pregnant or trying to get pregnant Breast-feeding How should I use this medication? This medication is injected into a vein. It is given by your care team in a hospital or clinic setting. A special MedGuide will be given to you before each treatment. Be sure to read this information carefully each time. Talk to your care team about the use of this medication in children. While it may be prescribed for children as young as 6 months for selected conditions, precautions do apply. Overdosage: If you think you have taken too much of this medicine contact a poison control center or emergency room at once. NOTE: This medicine is only for you. Do not share this medicine with others. What if I miss a dose? Keep appointments for follow-up doses. It is important not to miss your dose. Call your care team if you are unable to keep an appointment. What may interact with this medication? Interactions have not been studied. This list may not describe all possible interactions. Give your health care provider a list of all the medicines, herbs, non-prescription drugs, or dietary supplements you use. Also tell them if you smoke, drink alcohol, or use illegal drugs. Some items may interact with your medicine. What should I watch for while using this medication? Your condition will be monitored carefully while you are receiving this medication. You may need blood work while taking this medication. This medication may cause serious skin reactions. They can happen weeks to months   after starting the medication. Contact your care team right away if you notice fevers or flu-like symptoms with a rash. The rash may be red or purple and then turn into blisters or peeling of the skin. You may also notice a red rash with swelling of the face, lips, or lymph nodes in your  neck or under your arms. Tell your care team right away if you have any change in your eyesight. Talk to your care team if you may be pregnant. Serious birth defects can occur if you take this medication during pregnancy and for 4 months after the last dose. You will need a negative pregnancy test before starting this medication. Contraception is recommended while taking this medication and for 4 months after the last dose. Your care team can help you find the option that works for you. Do not breastfeed while taking this medication and for 4 months after the last dose. What side effects may I notice from receiving this medication? Side effects that you should report to your care team as soon as possible: Allergic reactions--skin rash, itching, hives, swelling of the face, lips, tongue, or throat Dry cough, shortness of breath or trouble breathing Eye pain, redness, irritation, or discharge with blurry or decreased vision Heart muscle inflammation--unusual weakness or fatigue, shortness of breath, chest pain, fast or irregular heartbeat, dizziness, swelling of the ankles, feet, or hands Hormone gland problems--headache, sensitivity to light, unusual weakness or fatigue, dizziness, fast or irregular heartbeat, increased sensitivity to cold or heat, excessive sweating, constipation, hair loss, increased thirst or amount of urine, tremors or shaking, irritability Infusion reactions--chest pain, shortness of breath or trouble breathing, feeling faint or lightheaded Kidney injury (glomerulonephritis)--decrease in the amount of urine, red or dark brown urine, foamy or bubbly urine, swelling of the ankles, hands, or feet Liver injury--right upper belly pain, loss of appetite, nausea, light-colored stool, dark yellow or brown urine, yellowing skin or eyes, unusual weakness or fatigue Pain, tingling, or numbness in the hands or feet, muscle weakness, change in vision, confusion or trouble speaking, loss of  balance or coordination, trouble walking, seizures Rash, fever, and swollen lymph nodes Redness, blistering, peeling, or loosening of the skin, including inside the mouth Sudden or severe stomach pain, bloody diarrhea, fever, nausea, vomiting Side effects that usually do not require medical attention (report to your care team if they continue or are bothersome): Bone, joint, or muscle pain Diarrhea Fatigue Loss of appetite Nausea Skin rash This list may not describe all possible side effects. Call your doctor for medical advice about side effects. You may report side effects to FDA at 1-800-FDA-1088. Where should I keep my medication? This medication is given in a hospital or clinic. It will not be stored at home. NOTE: This sheet is a summary. It may not cover all possible information. If you have questions about this medicine, talk to your doctor, pharmacist, or health care provider.  2023 Elsevier/Gold Standard (2022-02-10 00:00:00)   BELOW ARE SYMPTOMS THAT SHOULD BE REPORTED IMMEDIATELY: *FEVER GREATER THAN 100.4 F (38 C) OR HIGHER *CHILLS OR SWEATING *NAUSEA AND VOMITING THAT IS NOT CONTROLLED WITH YOUR NAUSEA MEDICATION *UNUSUAL SHORTNESS OF BREATH *UNUSUAL BRUISING OR BLEEDING *URINARY PROBLEMS (pain or burning when urinating, or frequent urination) *BOWEL PROBLEMS (unusual diarrhea, constipation, pain near the anus) TENDERNESS IN MOUTH AND THROAT WITH OR WITHOUT PRESENCE OF ULCERS (sore throat, sores in mouth, or a toothache) UNUSUAL RASH, SWELLING OR PAIN  UNUSUAL VAGINAL DISCHARGE OR ITCHING   Items   with * indicate a potential emergency and should be followed up as soon as possible or go to the Emergency Department if any problems should occur.  Please show the CHEMOTHERAPY ALERT CARD or IMMUNOTHERAPY ALERT CARD at check-in to the Emergency Department and triage nurse.  Should you have questions after your visit or need to cancel or reschedule your appointment, please  contact [facility] [phone]  and follow the prompts.  Office hours are 8:00 a.m. to 4:30 p.m. Monday - Friday. Please note that voicemails left after 4:00 p.m. may not be returned until the following business day.  We are closed weekends and major holidays. You have access to a nurse at all times for urgent questions. Please call the main number to the clinic [phone] and follow the prompts.  For any non-urgent questions, you may also contact your provider using MyChart. We now offer e-Visits for anyone 18 and older to request care online for non-urgent symptoms. For details visit [email or web address].   Also download the MyChart app! Go to the app store, search "MyChart", open the app, select [facility], and log in with your MyChart username and password.  

## 2023-02-01 NOTE — Patient Instructions (Signed)
[facility] Discharge Instructions   You were seen and examined today by Dr. Katragadda.  He reviewed the results of your lab work which are normal/stable.   We will proceed with your treatment today.  Return as scheduled.    Thank you for choosing [facility] to provide your oncology and hematology care.  To afford each patient quality time with our provider, please arrive at least 15 minutes before your scheduled appointment time.   If you have a lab appointment with the Cancer Center please come in thru the Main Entrance and check in at the main information desk.  You need to re-schedule your appointment should you arrive 10 or more minutes late.  We strive to give you quality time with our providers, and arriving late affects you and other patients whose appointments are after yours.  Also, if you no show three or more times for appointments you may be dismissed from the clinic at the providers discretion.     Again, thank you for choosing [facility].  Our hope is that these requests will decrease the amount of time that you wait before being seen by our physicians.       _____________________________________________________________  Should you have questions after your visit to [facility], please contact our office at [phone] and follow the prompts.  Our office hours are 8:00 a.m. and 4:30 p.m. Monday - Friday.  Please note that voicemails left after 4:00 p.m. may not be returned until the following business day.  We are closed weekends and major holidays.  You do have access to a nurse 24-7, just call the main number to the clinic [phone] and do not press any options, hold on the line and a nurse will answer the phone.    For prescription refill requests, have your pharmacy contact our office and allow 72 hours.    Due to Covid, you will need to wear a mask upon entering  the hospital. If you do not have a mask, a mask will be given to you at the Main Entrance upon arrival. For doctor visits, patients may have 1 support person age 18 or older with them. For treatment visits, patients can not have anyone with them due to social distancing guidelines and our immunocompromised population.      

## 2023-02-01 NOTE — Progress Notes (Signed)
Patient presents today for Keytruda infusion. Patient is in satisfactory condition with no new complaints voiced.  Vital signs are stable.  Labs reviewed by Dr. Katragadda during the office visit and all labs are within treatment parameters.  We will proceed with treatment per MD orders.   Keytruda given today per MD orders. Tolerated infusion without adverse affects. Vital signs stable. No complaints at this time. Discharged from clinic ambulatory in stable condition. Alert and oriented x 3. F/U with [facility] as scheduled.   

## 2023-02-02 ENCOUNTER — Other Ambulatory Visit: Payer: Self-pay | Admitting: Family Medicine

## 2023-02-02 LAB — LUTEINIZING HORMONE: LH: 12.9 m[IU]/mL — ABNORMAL HIGH (ref 1.7–8.6)

## 2023-02-02 LAB — FOLLICLE STIMULATING HORMONE: FSH: 23.7 m[IU]/mL — ABNORMAL HIGH (ref 1.5–12.4)

## 2023-02-03 ENCOUNTER — Encounter: Payer: Self-pay | Admitting: Family Medicine

## 2023-02-03 ENCOUNTER — Ambulatory Visit (INDEPENDENT_AMBULATORY_CARE_PROVIDER_SITE_OTHER): Payer: Medicare Other | Admitting: Family Medicine

## 2023-02-03 VITALS — BP 96/59 | HR 65 | Ht 67.0 in | Wt 188.0 lb

## 2023-02-03 DIAGNOSIS — I4819 Other persistent atrial fibrillation: Secondary | ICD-10-CM | POA: Diagnosis not present

## 2023-02-03 DIAGNOSIS — Z7901 Long term (current) use of anticoagulants: Secondary | ICD-10-CM

## 2023-02-03 LAB — COAGUCHEK XS/INR WAIVED
INR: 2.1 — ABNORMAL HIGH (ref 0.9–1.1)
Prothrombin Time: 25.2 s

## 2023-02-03 MED ORDER — WARFARIN SODIUM 5 MG PO TABS: ORAL_TABLET | ORAL | 3 refills | Status: DC

## 2023-02-03 MED ORDER — EZETIMIBE 10 MG PO TABS: 10.0000 mg | ORAL_TABLET | Freq: Every day | ORAL | 3 refills | Status: DC

## 2023-02-03 MED ORDER — DILTIAZEM HCL ER COATED BEADS 120 MG PO CP24: ORAL_CAPSULE | ORAL | 3 refills | Status: DC

## 2023-02-03 NOTE — Progress Notes (Signed)
BP (!) 96/59   Pulse 65   Ht  (1.702 m)   Wt 188 lb (85.3 kg)   SpO2 98%   BMI 29.44 kg/m    Subjective:   Patient ID: [name], male    DOB: 09-21-1932, 89 y.o.   MRN: [id number]  HPI: [name] is a 89 y.o. male presenting on 02/03/2023 for Medical Management of Chronic Issues and Atrial Fibrillation   HPI Coumadin recheck Target goal: 2.0-3.0 Reason on anticoagulation: A-fib Patient denies any bruising or bleeding or chest pain or palpitations   Relevant past medical, surgical, family and social history reviewed and updated as indicated. Interim medical history since our last visit reviewed. Allergies and medications reviewed and updated.  Review of Systems  Constitutional:  Negative for chills and fever.  Eyes:  Negative for visual disturbance.  Respiratory:  Negative for shortness of breath and wheezing.   Cardiovascular:  Negative for chest pain and leg swelling.  Musculoskeletal:  Negative for back pain and gait problem.  Skin:  Negative for rash.  All other systems reviewed and are negative.   Per HPI unless specifically indicated above   Allergies as of 02/03/2023       Reactions   Penicillins Hives, Rash   Has patient had a PCN reaction causing immediate rash, facial/tongue/throat swelling, SOB or lightheadedness with hypotension: Yes Has patient had a PCN reaction causing severe rash involving mucus membranes or skin necrosis: Yes Has patient had a PCN reaction that required hospitalization: No Has patient had a PCN reaction occurring within the last 10 years: No If all of the above answers are "NO", then may proceed with Cephalosporin use.   Hct [hydrochlorothiazide] Other (See Comments)   hyper   Statins Other (See Comments)   Myalgia, tolerates low dosages of lovastatin         Medication List        Accurate as of February 03, 2023  9:44 AM. If you have any questions, ask your nurse or doctor.          acetaminophen 500 MG  tablet Commonly known as: TYLENOL Take 1,000 mg by mouth every 6 (six) hours as needed for moderate pain.   CARBOPLATIN IV Inject into the vein every 21 ( twenty-one) days.   cetirizine-pseudoephedrine 5-120 MG tablet Commonly known as: ZYRTEC-D Take once per day as need for congestion/runny nose   diltiazem 120 MG 24 hr capsule Commonly known as: CARDIZEM CD TAKE ONE (1) CAPSULE EACH DAY   ezetimibe 10 MG tablet Commonly known as: ZETIA Take 1 tablet (10 mg total) by mouth daily.   finasteride 5 MG tablet Commonly known as: PROSCAR TAKE ONE (1) TABLET EACH DAY   fluticasone 50 MCG/ACT nasal spray Commonly known as: FLONASE Place 1 spray into both nostrils daily as needed for allergies or rhinitis.   furosemide 20 MG tablet Commonly known as: LASIX Take 1 tablet (20 mg total) by mouth daily.   lidocaine-prilocaine cream Commonly known as: EMLA Apply a small amount to port a cath site and cover with plastic wrap 1 hour prior to chemotherapy appointments   lovastatin 20 MG tablet Commonly known as: MEVACOR TAKE 1/2 TABLET EVERY OTHER DAY   metoprolol tartrate 100 MG tablet Commonly known as: LOPRESSOR TAKE ONE (1) TABLET BY MOUTH TWO (2) TIMES DAILY   montelukast 10 MG tablet Commonly known as: SINGULAIR Take 1 tablet (10 mg total) by mouth at bedtime.   PACLITAXEL IV  Inject into the vein every 21 ( twenty-one) days.   PEMBROLIZUMAB IV Inject into the vein every 21 ( twenty-one) days.   prochlorperazine 10 MG tablet Commonly known as: COMPAZINE Take 1 tablet (10 mg total) by mouth every 6 (six) hours as needed for nausea or vomiting.   tobramycin 0.3 % ophthalmic solution Commonly known as: TOBREX   Vitamin D3 125 MCG (5000 UT) Caps Take 5,000 Units by mouth once a week.   warfarin 5 MG tablet Commonly known as: COUMADIN Take as directed by the anticoagulation clinic. If you are unsure how to take this medication, talk to your nurse or doctor. Original  instructions: TAKE 1 TABLET ON SAT, SUN, TUES, & THURSTAKE 1/2 TABLET ON MON, WED, & FRIDAY         Objective:   BP (!) 96/59   Pulse 65   Ht  (1.702 m)   Wt 188 lb (85.3 kg)   SpO2 98%   BMI 29.44 kg/m   Wt Readings from Last 3 Encounters:  02/03/23 188 lb (85.3 kg)  02/01/23 185 lb 12.8 oz (84.3 kg)  01/11/23 187 lb (84.8 kg)    Physical Exam Vitals and nursing note reviewed.  Constitutional:      General: He is not in acute distress.    Appearance: Normal appearance. He is well-developed. He is not diaphoretic.  Neck:     Thyroid: No thyromegaly.  Cardiovascular:     Rate and Rhythm: Normal rate. Rhythm irregular.     Heart sounds: Normal heart sounds. No murmur heard. Pulmonary:     Effort: Pulmonary effort is normal. No respiratory distress.     Breath sounds: Normal breath sounds. No wheezing.  Skin:    General: Skin is warm and dry.     Findings: No rash.  Neurological:     Mental Status: He is alert and oriented to person, place, and time.     Coordination: Coordination normal.  Psychiatric:        Behavior: Behavior normal.       Assessment & Plan:   Problem List Items Addressed This Visit       Cardiovascular and Mediastinum   Atrial fibrillation, persistent - Primary   Relevant Orders   CoaguChek XS/INR Waived     Other   Long term current use of anticoagulant therapy    Description   continue weekly dose by taking 1 tablet every day except for Thursdays and Saturdays take 1/2 tablet  INR today is  2.1 (goal is 2-3)    Recheck in 4-6 weeks      Patient is going to try off finasteride and see how it goes, oncology was concerned it caused swelling. Follow up plan: Return if symptoms worsen or fail to improve, for 6-week INR.  Counseling provided for all of the vaccine components Orders Placed This Encounter  Procedures   CoaguChek XS/INR Waived    Arville Care, MD [facility] 02/03/2023, 9:44  AM

## 2023-02-19 ENCOUNTER — Ambulatory Visit (HOSPITAL_COMMUNITY)
Admission: RE | Admit: 2023-02-19 | Discharge: 2023-02-19 | Disposition: A | Discharge status: Home / Self Care | Payer: Medicare Other | Source: Ambulatory Visit | Attending: Hematology | Admitting: Hematology

## 2023-02-19 DIAGNOSIS — C4442 Squamous cell carcinoma of skin of scalp and neck: Secondary | ICD-10-CM | POA: Diagnosis not present

## 2023-02-19 DIAGNOSIS — C760 Malignant neoplasm of head, face and neck: Secondary | ICD-10-CM | POA: Diagnosis not present

## 2023-02-19 DIAGNOSIS — R918 Other nonspecific abnormal finding of lung field: Secondary | ICD-10-CM | POA: Diagnosis not present

## 2023-02-19 DIAGNOSIS — J3489 Other specified disorders of nose and nasal sinuses: Secondary | ICD-10-CM | POA: Diagnosis not present

## 2023-02-19 MED ORDER — IOHEXOL 300 MG/ML  SOLN
100.0000 mL | Freq: Once | INTRAMUSCULAR | Status: AC | PRN
  Administered 2023-02-19: 75 mL via INTRAVENOUS

## 2023-02-22 ENCOUNTER — Other Ambulatory Visit: Payer: Medicare Other

## 2023-02-22 ENCOUNTER — Ambulatory Visit: Payer: Medicare Other | Admitting: Family Medicine

## 2023-02-22 ENCOUNTER — Ambulatory Visit: Payer: Medicare Other | Admitting: Hematology

## 2023-02-22 ENCOUNTER — Ambulatory Visit: Payer: Medicare Other

## 2023-02-22 NOTE — Progress Notes (Signed)
[facility] [address], [place] [address]    Clinic Day:  02/23/2023  Referring physician: [name], Elige Radon, MD  Patient Care Team: [name], Elige Radon, MD as PCP - General (Family Medicine) [name], MD as PCP - Cardiology (Cardiology) Bjorn Pippin, MD as Attending Physician (Urology) [name], MD as Consulting Physician (Cardiology) [name], MD (Inactive) as Consulting Physician (Gastroenterology) [name], MD as Consulting Physician (Ophthalmology) [name], MD as Consulting Physician (Orthopedic Surgery) [name], [name], LCSW as [facility] Care Management (Licensed Clinical Social Worker) [name], MD as Medical Oncologist (Medical Oncology) [name], RN as Oncology Nurse Navigator (Medical Oncology)   ASSESSMENT & PLAN:   Assessment: 1.  Right supraclavicular lymphadenopathy and multiple lung nodules: - Patient noticed right neck mass since Christmas 2022.  10 to 15 pound weight loss since January 2023.  Denies any dysphagia or odynophagia. - CT neck (05/27/2022): Numerous lymph nodes in the right neck, right supraclavicular lymph node mass measures 5.7 x 3.7 cm.  18 mm posterior lymph node in the right lower neck.  Numerous lymph nodes in the right lower and posterior neck approximately 1 cm.  Many have internal necrosis consistent with metastatic disease.  20 mm lymph node just above the right clavicle.  Left level 2 node 12 mm. - CT CAP (05/28/2022): Several bilateral lung nodules, RUL nodule measuring 1.8 x 1.3 cm.  Small clusters of nodules in the medial left upper lobe.  Irregular nodular density along the left side of the mediastinum measuring 2.1 cm.  No metastatic disease in the abdomen or pelvis.  Liver is slightly nodular contour. - Lab work shows elevated white count since 2009, predominantly lymphocytosis. - Pathology (06/09/2022): Metastatic carcinoma positive for p40, p63  and CK5/6-patchy positivity with CK7.  Negative for TTF-1, Napsin A, PAX8, prostein, PSA, CK20 and CDX2.  - PET scan (06/11/2022): Bulky right supraclavicular nodal mass, smaller hypermetabolic right posterior triangle and jugular lymph nodes and single hypermetabolic left level 3 lymph node.  Bilateral hypermetabolic pulmonary nodules and metastatic pattern.  Minimal hypermetabolic mediastinal nodal metastasis.  Cluster of hypermetabolic right axillary lymph nodes.  No evidence of metastatic disease or primary lesion in the abdomen or pelvis.  No bone lesions. - MRI of the brain: Chronic small vessel ischemic changes with no evidence of metastatic disease. - NGS: T p53 pathogenic variant, MSI-stable, TMB-low, LOH-low, PD-L1 (XB147): Negative, p16 and p18 negative - Cancer type ID: 96% probability squamous cell carcinoma, subtype head and neck/skin.  Cancer types ruled out with 95% confidence includes skin basal cell carcinoma. - Carboplatin, Taxol and pembrolizumab 07/23/2022 through 11/09/22   2.  Social/family history: - He lives at home with his wife.  Independent of ADLs and IADLs.  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  May have had asbestos exposure 30 years ago.  Quit smoking cigarettes in 1965.  Smoked 1 pack/day for less than 12 years.  2 of his brothers had lung cancer and both were smokers.    Plan: 1.  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 - He reports occasional dizziness but denies any falls.  Very mild degree of tiredness. - We have reviewed CT soft tissue neck from 02/19/2023: Mixed response with some lymph nodes mildly increased, less than 2 to 3 mm, some nodes mildly decreased and some nodes unchanged.  CT  chest showed stable bilateral pulmonary nodules with mildly increased size of right hilar, paratracheal and right axillary lymph nodes, anywhere between 2 to 6  mm. - We have discussed alternative treatment options including cetuximab and other single agent chemotherapy options for second line head and neck cancer. - Due to mixed response and better tolerability, we have decided to continue Keytruda at this time with repeating CT scan after 3 cycles.  He is agreeable. - We reviewed labs from today which showed normal LFTs and creatinine.  CBC grossly normal.  Proceed with treatment today and in 3 weeks.  RTC 6 weeks for follow-up.   2.  Neuropathy: - Leg pains and neuropathy have been stable but improved since paclitaxel discontinued.  Continue Tylenol as needed.    Orders Placed This Encounter  Procedures   Magnesium    Standing Status:   Future    Standing Expiration Date:   06/27/2024   CBC with Differential    Standing Status:   Future    Standing Expiration Date:   06/27/2024   Comprehensive metabolic panel    Standing Status:   Future    Standing Expiration Date:   06/27/2024   T4    Standing Status:   Future    Standing Expiration Date:   06/27/2024   TSH    Standing Status:   Future    Standing Expiration Date:   06/27/2024   Magnesium    Standing Status:   Future    Standing Expiration Date:   07/18/2024   CBC with Differential    Standing Status:   Future    Standing Expiration Date:   07/18/2024   Comprehensive metabolic panel    Standing Status:   Future    Standing Expiration Date:   07/18/2024      I,Katie Daubenspeck,acting as a scribe for [name], MD.,have documented all relevant documentation on the behalf of [name], MD,as directed by  [name], MD while in the presence of [name], MD.   I, [name] MD, have reviewed the above documentation for accuracy and completeness, and I agree with the above.   [name], MD   5/14/20244:51 PM  CHIEF COMPLAINT:   Diagnosis: poorly differentiated squamous cell carcinoma    Cancer Staging  Squamous cell  carcinoma of head and neck Staging form: Cervical Lymph Nodes and Unknown Primary Tumors of the Head and Neck, AJCC 8th Edition - Clinical stage from 07/16/2022: Stage IVC (cT0, cN3b, cM1) - Unsigned    Prior Therapy: Carboplatin, paclitaxel and pembrolizumab   Current Therapy:  pembrolizumab alone    HISTORY OF PRESENT ILLNESS:   Oncology History  Squamous cell carcinoma of head and neck  07/16/2022 Initial Diagnosis   Squamous cell carcinoma of head and neck (HCC)   07/23/2022 -  Chemotherapy   Patient is on Treatment Plan : Carboplatin  + Paclitaxel  + Pembrolizumab (200) D1 q21d        INTERVAL HISTORY:   [name] is a 89 y.o. male presenting to clinic today for follow up of poorly differentiated squamous cell carcinoma. He was last seen by me on 02/01/23.  Since his last visit, he underwent restaging CT chest and neck on 02/19/23.   Today, he states that he is doing well overall. His appetite level is at 100%. His energy level is at 60%.  PAST MEDICAL HISTORY:   Past Medical History: Past Medical History:  Diagnosis Date   Anemia    Atrial  fibrillation (HCC)    Cataract    Cellulitis    In past   Complication of anesthesia    HARD TIME WAKING; CAUSES MY BP TO GO UP    Coronary artery disease    5 bypasses   DJD (degenerative joint disease)    Ejection fraction < 50%    Mildly reduced, 40% by echo   GERD (gastroesophageal reflux disease)    Hyperlipidemia    Hypertension    Persistent atrial fibrillation (HCC)    Prostate hypertrophy    on CT scan 09/2014   PUD (peptic ulcer disease)    RESOLVED    Skin cancer of eyelid    Resected    Surgical History: Past Surgical History:  Procedure Laterality Date   APPENDECTOMY     BYPASS GRAFT  2005   CATARACT EXTRACTION W/PHACO Left 07/22/2015   Procedure: CATARACT EXTRACTION PHACO AND INTRAOCULAR LENS PLACEMENT LEFT EYE CDE=8.14;  Surgeon: [name], MD;  Location: [facility];  Service: Ophthalmology;  Laterality:  Left;   CATARACT EXTRACTION W/PHACO Right 08/18/2019   Procedure: CATARACT EXTRACTION PHACO AND INTRAOCULAR LENS PLACEMENT (IOC);  Surgeon: Fabio Pierce, MD;  Location: [facility];  Service: Ophthalmology;  Laterality: Right;  CDE: 9.74   CORONARY ARTERY BYPASS GRAFT  4/05   LIMA to LAD, SVG to PDA, SVG to ramus intermediate   EYE SURGERY  07/2015   EYELID CARCINOMA EXCISION     Skin cancer resected   Heart bypass  2005   LYMPH NODE BIOPSY Right 06/09/2022   Procedure: LYMPH NODE BIOPSY; supraclavicular;  Surgeon: Lucretia Roers, MD;  Location: [facility];  Service: General;  Laterality: Right;   PORTACATH PLACEMENT Left 06/09/2022   Procedure: INSERTION PORT-A-CATH;  Surgeon: Lucretia Roers, MD;  Location: [facility];  Service: General;  Laterality: Left;   TOTAL HIP ARTHROPLASTY     Right   TOTAL HIP ARTHROPLASTY Left 05/04/2018   Procedure: LEFT TOTAL HIP ARTHROPLASTY;  Surgeon: [name], MD;  Location: [facility];  Service: Orthopedics;  Laterality: Left;   TOTAL KNEE ARTHROPLASTY     Right    Social History: Social History   Socioeconomic History   Marital status: Married    Spouse name: Talbert Forest   Number of children: 1   Years of education: 10   Highest education level: 10th grade  Occupational History   Occupation: retired  Tobacco Use   Smoking status: Former    Packs/day: 2.00    Years: 5.00    Additional pack years: 0.00    Total pack years: 10.00    Types: Cigarettes    Quit date: 12/23/1963    Years since quitting: 59.2   Smokeless tobacco: Never  Vaping Use   Vaping Use: Never used  Substance and Sexual Activity   Alcohol use: No   Drug use: No   Sexual activity: Yes  Other Topics Concern   Not on file  Social History Narrative   Lives in a split level home.   Social Determinants of Health   Financial Resource Strain: Low Risk  (01/31/2023)   Overall Financial Resource Strain (CARDIA)    Difficulty of Paying Living Expenses: Not hard at all  Food  Insecurity: No Food Insecurity (01/31/2023)   Hunger Vital Sign    Worried About Running Out of Food in the Last Year: Never true    Ran Out of Food in the Last Year: Never true  Transportation Needs: No Transportation Needs (01/31/2023)  PRAPARE - Administrator, Civil Service (Medical): No    Lack of Transportation (Non-Medical): No  Physical Activity: Inactive (01/31/2023)   Exercise Vital Sign    Days of Exercise per Week: 0 days    Minutes of Exercise per Session: 30 min  Stress: No Stress Concern Present (01/31/2023)   [employer] of Occupational Health - Occupational Stress Questionnaire    Feeling of Stress : Only a little  Social Connections: Socially Isolated (01/31/2023)   Social Connection and Isolation Panel [NHANES]    Frequency of Communication with Friends and Family: Once a week    Frequency of Social Gatherings with Friends and Family: Once a week    Attends Religious Services: Never    Database administrator or Organizations: No    Attends Banker Meetings: Never    Marital Status: Married  Catering manager Violence: Not At Risk (01/06/2023)   Humiliation, Afraid, Rape, and Kick questionnaire    Fear of Current or Ex-Partner: No    Emotionally Abused: No    Physically Abused: No    Sexually Abused: No    Family History: Family History  Problem Relation Age of Onset   Stroke Mother    Stroke Father    Hypertension Father    Early death Brother        57 months old   Cancer Brother        lung   Stroke Brother        heat    Current Medications:  Current Outpatient Medications:    acetaminophen (TYLENOL) 500 MG tablet, Take 1,000 mg by mouth every 6 (six) hours as needed for moderate pain., Disp: , Rfl:    CARBOPLATIN IV, Inject into the vein every 21 ( twenty-one) days., Disp: , Rfl:    Cholecalciferol (VITAMIN D3) 5000 units CAPS, Take 5,000 Units by mouth once a week. , Disp: , Rfl:    diltiazem (CARDIZEM CD) 120 MG 24 hr  capsule, TAKE ONE (1) CAPSULE EACH DAY, Disp: 90 capsule, Rfl: 3   ezetimibe (ZETIA) 10 MG tablet, Take 1 tablet (10 mg total) by mouth daily., Disp: 90 tablet, Rfl: 3   finasteride (PROSCAR) 5 MG tablet, TAKE ONE (1) TABLET EACH DAY, Disp: 90 tablet, Rfl: 3   fluticasone (FLONASE) 50 MCG/ACT nasal spray, Place 1 spray into both nostrils daily as needed for allergies or rhinitis., Disp: , Rfl:    furosemide (LASIX) 20 MG tablet, Take 1 tablet (20 mg total) by mouth daily., Disp: 90 tablet, Rfl: 3   lovastatin (MEVACOR) 20 MG tablet, TAKE 1/2 TABLET EVERY OTHER DAY, Disp: 23 tablet, Rfl: 3   metoprolol tartrate (LOPRESSOR) 100 MG tablet, TAKE ONE (1) TABLET BY MOUTH TWO (2) TIMES DAILY, Disp: 180 tablet, Rfl: 3   montelukast (SINGULAIR) 10 MG tablet, Take 1 tablet (10 mg total) by mouth at bedtime., Disp: 90 tablet, Rfl: 3   PACLITAXEL IV, Inject into the vein every 21 ( twenty-one) days., Disp: , Rfl:    PEMBROLIZUMAB IV, Inject into the vein every 21 ( twenty-one) days., Disp: , Rfl:    warfarin (COUMADIN) 5 MG tablet, TAKE 1 TABLET ON SAT, SUN, TUES, & THURSTAKE 1/2 TABLET ON MON, WED, & FRIDAY, Disp: 70 tablet, Rfl: 3 No current facility-administered medications for this visit.  Facility-Administered Medications Ordered in Other Visits:    sodium chloride flush (NS) 0.9 % injection 10 mL, 10 mL, Intracatheter, PRN, [name], MD, 10 mL  at 02/23/23 1436   Allergies: Allergies  Allergen Reactions   Penicillins Hives and Rash    Has patient had a PCN reaction causing immediate rash, facial/tongue/throat swelling, SOB or lightheadedness with hypotension: Yes Has patient had a PCN reaction causing severe rash involving mucus membranes or skin necrosis: Yes Has patient had a PCN reaction that required hospitalization: No Has patient had a PCN reaction occurring within the last 10 years: No If all of the above answers are "NO", then may proceed with Cephalosporin use.    Hct  [Hydrochlorothiazide] Other (See Comments)    hyper   Statins Other (See Comments)    Myalgia, tolerates low dosages of lovastatin     REVIEW OF SYSTEMS:   Review of Systems  Constitutional:  Negative for chills, fatigue and fever.  HENT:   Negative for lump/mass, mouth sores, nosebleeds, sore throat and trouble swallowing.   Eyes:  Negative for eye problems.  Respiratory:  Positive for shortness of breath. Negative for cough.   Cardiovascular:  Negative for chest pain, leg swelling and palpitations.  Gastrointestinal:  Negative for abdominal pain, constipation, diarrhea, nausea and vomiting.  Genitourinary:  Negative for bladder incontinence, difficulty urinating, dysuria, frequency, hematuria and nocturia.   Musculoskeletal:  Negative for arthralgias, back pain, flank pain, myalgias and neck pain.  Skin:  Negative for itching and rash.  Neurological:  Positive for dizziness and numbness. Negative for headaches.  Hematological:  Does not bruise/bleed easily.  Psychiatric/Behavioral:  Negative for depression, sleep disturbance and suicidal ideas. The patient is not nervous/anxious.   All other systems reviewed and are negative.    VITALS:   There were no vitals taken for this visit.  Wt Readings from Last 3 Encounters:  02/23/23 187 lb (84.8 kg)  02/03/23 188 lb (85.3 kg)  02/01/23 185 lb 12.8 oz (84.3 kg)    There is no height or weight on file to calculate BMI.  Performance status (ECOG): 1 - Symptomatic but completely ambulatory  PHYSICAL EXAM:   Physical Exam Vitals and nursing note reviewed. Exam conducted with a chaperone present.  Constitutional:      Appearance: Normal appearance.  Cardiovascular:     Rate and Rhythm: Normal rate and regular rhythm.     Pulses: Normal pulses.     Heart sounds: Normal heart sounds.  Pulmonary:     Effort: Pulmonary effort is normal.     Breath sounds: Normal breath sounds.  Abdominal:     Palpations: Abdomen is soft. There is  no hepatomegaly, splenomegaly or mass.     Tenderness: There is no abdominal tenderness.  Musculoskeletal:     Right lower leg: No edema.     Left lower leg: No edema.  Lymphadenopathy:     Cervical: No cervical adenopathy.     Right cervical: No superficial, deep or posterior cervical adenopathy.    Left cervical: No superficial, deep or posterior cervical adenopathy.     Upper Body:     Right upper body: No supraclavicular or axillary adenopathy.     Left upper body: No supraclavicular or axillary adenopathy.  Neurological:     General: No focal deficit present.     Mental Status: He is alert and oriented to person, place, and time.  Psychiatric:        Mood and Affect: Mood normal.        Behavior: Behavior normal.     LABS:      Latest Ref Rng & Units 02/23/2023  11:41 AM 02/01/2023   10:37 AM 01/11/2023   11:22 AM  CBC  WBC 4.0 - 10.5 K/uL 12.7  12.0  11.6   Hemoglobin 13.0 - 17.0 g/dL 16.1  09.6  04.5   Hematocrit 39.0 - 52.0 % 40.3  40.2  39.9   Platelets 150 - 400 K/uL 196  180  182       Latest Ref Rng & Units 02/23/2023   11:41 AM 02/01/2023   10:37 AM 01/11/2023   11:22 AM  CMP  Glucose 70 - 99 mg/dL 92  91  97   BUN 8 - 23 mg/dL 17  19  19    Creatinine 0.61 - 1.24 mg/dL 4.09  8.11  9.14   Sodium 135 - 145 mmol/L 137  139  139   Potassium 3.5 - 5.1 mmol/L 4.1  4.0  4.1   Chloride 98 - 111 mmol/L 105  104  103   CO2 22 - 32 mmol/L 26  27  25    Calcium 8.9 - 10.3 mg/dL 8.8  8.9  8.9   Total Protein 6.5 - 8.1 g/dL 6.9  6.9  7.1   Total Bilirubin 0.3 - 1.2 mg/dL 0.6  0.7  0.7   Alkaline Phos 38 - 126 U/L 64  61  67   AST 15 - 41 U/L 19  17  17    ALT 0 - 44 U/L 15  13  14       Lab Results  Component Value Date   CEA1 5.4 (H) 06/03/2022   /  CEA  Date Value Ref Range Status  06/03/2022 5.4 (H) 0.0 - 4.7 ng/mL Final    Comment:    (NOTE)                             Nonsmokers          <3.9                             Smokers             <5.6 Roche  Diagnostics Electrochemiluminescence Immunoassay (ECLIA) Values obtained with different assay methods or kits cannot be used interchangeably.  Results cannot be interpreted as absolute evidence of the presence or absence of malignant disease. Performed At: [facility] [address] [place], [place] [id number] Jolene Schimke MD [id number]:[id number]    Lab Results  Component Value Date   PSA1 11.3 (H) 02/16/2022   Lab Results  Component Value Date   CAN199 68 (H) 06/03/2022   No results found for: "CAN125"  No results found for: "TOTALPROTELP", "ALBUMINELP", "A1GS", "A2GS", "BETS", "BETA2SER", "GAMS", "MSPIKE", "SPEI" No results found for: "TIBC", "FERRITIN", "IRONPCTSAT" Lab Results  Component Value Date   LDH 164 06/03/2022     STUDIES:   CT SOFT TISSUE NECK W CONTRAST  Result Date: 02/23/2023 CLINICAL DATA:  Metastatic disease evaluation EXAM: CT NECK WITH CONTRAST TECHNIQUE: Multidetector CT imaging of the neck was performed using the standard protocol following the bolus administration of intravenous contrast. RADIATION DOSE REDUCTION: This exam was performed according to the departmental dose-optimization program which includes automated exposure control, adjustment of the mA and/or kV according to patient size and/or use of iterative reconstruction technique. CONTRAST:  75mL OMNIPAQUE IOHEXOL 300 MG/ML  SOLN COMPARISON:  CT neck December 16, 2022. FINDINGS: Pharynx and larynx: Normal. No mass or swelling. Salivary glands: No  inflammation, mass, or stone. Thyroid: Normal. Lymph nodes: Mixed response with mild interval increase in some lymph nodes (for example left level III measures 11 x 10 mm, previously 9 x 8 mm on series 3, image 75), similar size of other lymph nodes (for example 10 x 9 mm lymph node in the right level III station on series 3, image 74), and mild interval decrease in some nodes (such as 9 x 8 mm right level V node on series 3, image 73). Vascular:  Atherosclerosis of the aorta and great vessels. Limited intracranial: Negative. Visualized orbits: Negative. Mastoids and visualized paranasal sinuses: Paranasal sinus mucosal thickening. No mastoid effusions. Skeleton: No acute or aggressive process. Upper chest: Visualized lung apices are clear. IMPRESSION: Mixed response with some is mildly increased, some nodes mildly decreased, and some nodes unchanged. Electronically Signed   By: Feliberto Harts M.D.   On: 02/23/2023 13:19   CT Chest W Contrast  Result Date: 02/23/2023 CLINICAL DATA:  Squamous cell head neck cancer, follow-up pulmonary nodules EXAM: CT CHEST WITH CONTRAST TECHNIQUE: Multidetector CT imaging of the chest was performed during intravenous contrast administration. RADIATION DOSE REDUCTION: This exam was performed according to the departmental dose-optimization program which includes automated exposure control, adjustment of the mA and/or kV according to patient size and/or use of iterative reconstruction technique. CONTRAST:  75mL OMNIPAQUE IOHEXOL 300 MG/ML  SOLN COMPARISON:  12/16/2022 FINDINGS: Cardiovascular: Mild cardiomegaly.  No pericardial effusion. No evidence thoracic aortic aneurysm. Atherosclerotic calcifications of the aortic arch. Moderate three-vessel coronary atherosclerosis. Mediastinum/Nodes: Small bilateral supraclavicular nodes (series 1/image 12), better evaluated on dedicated CT neck. Thoracic lymphadenopathy, including a dominant 2.2 cm right hilar node (series 1/image 32), previously 1.6 cm. 13 mm short axis low right paratracheal node, previously 9 mm. 11 mm short axis right axillary node (series 1/image 95), previously 9 mm. Visualized thyroid is grossly unremarkable. Lungs/Pleura: Mild subpleural reticulation/fibrosis in the lungs bilaterally, lower lobe predominant. No focal consolidation. Bilateral nodular opacities in the upper lobes, including a dominant 15 x 7 mm subpleural nodule in the lateral right upper  lobe, previously 15 x 9 mm, grossly unchanged. Additional nodules are grossly unchanged (series 5/images 37, 44, and 50). No pleural effusion or pneumothorax. Upper Abdomen: Visualized upper abdomen is notable for layering gallstones and vascular calcifications. Musculoskeletal: Degenerative changes of the visualized thoracolumbar spine. Median sternotomy. IMPRESSION: Stable bilateral pulmonary nodules/metastases. Progressive thoracic nodal metastases, as above. Aortic Atherosclerosis (ICD10-I70.0). Electronically Signed   By: Charline Bills M.D.   On: 02/23/2023 01:19

## 2023-02-23 ENCOUNTER — Inpatient Hospital Stay (HOSPITAL_BASED_OUTPATIENT_CLINIC_OR_DEPARTMENT_OTHER): Payer: Medicare Other | Admitting: Hematology

## 2023-02-23 ENCOUNTER — Inpatient Hospital Stay: Payer: Medicare Other | Attending: Hematology

## 2023-02-23 ENCOUNTER — Inpatient Hospital Stay: Payer: Medicare Other

## 2023-02-23 VITALS — BP 132/84 | HR 69 | Temp 96.8°F | Resp 16

## 2023-02-23 DIAGNOSIS — Z95828 Presence of other vascular implants and grafts: Secondary | ICD-10-CM

## 2023-02-23 DIAGNOSIS — Z87891 Personal history of nicotine dependence: Secondary | ICD-10-CM | POA: Diagnosis not present

## 2023-02-23 DIAGNOSIS — G629 Polyneuropathy, unspecified: Secondary | ICD-10-CM | POA: Diagnosis not present

## 2023-02-23 DIAGNOSIS — Z5112 Encounter for antineoplastic immunotherapy: Secondary | ICD-10-CM | POA: Diagnosis not present

## 2023-02-23 DIAGNOSIS — C801 Malignant (primary) neoplasm, unspecified: Secondary | ICD-10-CM | POA: Diagnosis not present

## 2023-02-23 DIAGNOSIS — C771 Secondary and unspecified malignant neoplasm of intrathoracic lymph nodes: Secondary | ICD-10-CM | POA: Diagnosis not present

## 2023-02-23 DIAGNOSIS — R918 Other nonspecific abnormal finding of lung field: Secondary | ICD-10-CM | POA: Insufficient documentation

## 2023-02-23 DIAGNOSIS — Z79620 Long term (current) use of immunosuppressive biologic: Secondary | ICD-10-CM | POA: Insufficient documentation

## 2023-02-23 DIAGNOSIS — Z801 Family history of malignant neoplasm of trachea, bronchus and lung: Secondary | ICD-10-CM | POA: Insufficient documentation

## 2023-02-23 DIAGNOSIS — C4442 Squamous cell carcinoma of skin of scalp and neck: Secondary | ICD-10-CM | POA: Diagnosis not present

## 2023-02-23 DIAGNOSIS — D72820 Lymphocytosis (symptomatic): Secondary | ICD-10-CM | POA: Diagnosis not present

## 2023-02-23 LAB — COMPREHENSIVE METABOLIC PANEL
ALT: 15 U/L (ref 0–44)
AST: 19 U/L (ref 15–41)
Albumin: 3.7 g/dL (ref 3.5–5.0)
Alkaline Phosphatase: 64 U/L (ref 38–126)
Anion gap: 6 (ref 5–15)
BUN: 17 mg/dL (ref 8–23)
CO2: 26 mmol/L (ref 22–32)
Calcium: 8.8 mg/dL — ABNORMAL LOW (ref 8.9–10.3)
Chloride: 105 mmol/L (ref 98–111)
Creatinine, Ser: 1.04 mg/dL (ref 0.61–1.24)
GFR, Estimated: >60 mL/min (ref >60)
Glucose, Bld: 92 mg/dL (ref 70–99)
Potassium: 4.1 mmol/L (ref 3.5–5.1)
Sodium: 137 mmol/L (ref 135–145)
Total Bilirubin: 0.6 mg/dL (ref 0.3–1.2)
Total Protein: 6.9 g/dL (ref 6.5–8.1)

## 2023-02-23 LAB — CBC WITH DIFFERENTIAL/PLATELET
Abs Immature Granulocytes: 0.04 10*3/uL (ref 0.00–0.07)
Basophils Absolute: 0.1 10*3/uL (ref 0.0–0.1)
Basophils Relative: 1 %
Eosinophils Absolute: 1.3 10*3/uL — ABNORMAL HIGH (ref 0.0–0.5)
Eosinophils Relative: 10 %
HCT: 40.3 % (ref 39.0–52.0)
Hemoglobin: 13.2 g/dL (ref 13.0–17.0)
Immature Granulocytes: 0 %
Lymphocytes Relative: 40 %
Lymphs Abs: 5 10*3/uL — ABNORMAL HIGH (ref 0.7–4.0)
MCH: 34.2 pg — ABNORMAL HIGH (ref 26.0–34.0)
MCHC: 32.8 g/dL (ref 30.0–36.0)
MCV: 104.4 fL — ABNORMAL HIGH (ref 80.0–100.0)
Monocytes Absolute: 0.7 10*3/uL (ref 0.1–1.0)
Monocytes Relative: 6 %
Neutro Abs: 5.5 10*3/uL (ref 1.7–7.7)
Neutrophils Relative %: 43 %
Platelets: 196 10*3/uL (ref 150–400)
RBC: 3.86 MIL/uL — ABNORMAL LOW (ref 4.22–5.81)
RDW: 12 % (ref 11.5–15.5)
WBC: 12.7 10*3/uL — ABNORMAL HIGH (ref 4.0–10.5)
nRBC: 0 % (ref 0.0–0.2)

## 2023-02-23 LAB — TSH: TSH: 1.85 u[IU]/mL (ref 0.350–4.500)

## 2023-02-23 LAB — MAGNESIUM: Magnesium: 2 mg/dL (ref 1.7–2.4)

## 2023-02-23 MED ORDER — SODIUM CHLORIDE 0.9% FLUSH
10.0000 mL | Freq: Once | INTRAVENOUS | Status: AC
  Administered 2023-02-23: 10 mL via INTRAVENOUS

## 2023-02-23 MED ORDER — SODIUM CHLORIDE 0.9 % IV SOLN: Freq: Once | INTRAVENOUS | Status: AC

## 2023-02-23 MED ORDER — SODIUM CHLORIDE 0.9 % IV SOLN
200.0000 mg | Freq: Once | INTRAVENOUS | Status: AC
  Administered 2023-02-23: 200 mg via INTRAVENOUS
  Filled 2023-02-23: qty 8

## 2023-02-23 MED ORDER — SODIUM CHLORIDE 0.9% FLUSH
10.0000 mL | INTRAVENOUS | Status: DC | PRN
  Administered 2023-02-23: 10 mL

## 2023-02-23 MED ORDER — HEPARIN SOD (PORK) LOCK FLUSH 100 UNIT/ML IV SOLN
500.0000 [IU] | Freq: Once | INTRAVENOUS | Status: AC | PRN
  Administered 2023-02-23: 500 [IU]

## 2023-02-23 NOTE — Patient Instructions (Signed)
[facility]  Discharge Instructions: Thank you for choosing [facility] to provide your oncology and hematology care.  If you have a lab appointment with the Cancer Center - please note that after April 8th, 2024, all labs will be drawn in the cancer center.  You do not have to check in or register with the main entrance as you have in the past but will complete your check-in in the cancer center.  Wear comfortable clothing and clothing appropriate for easy access to any Portacath or PICC line.   We strive to give you quality time with your provider. You may need to reschedule your appointment if you arrive late (15 or more minutes).  Arriving late affects you and other patients whose appointments are after yours.  Also, if you miss three or more appointments without notifying the office, you may be dismissed from the clinic at the provider's discretion.      For prescription refill requests, have your pharmacy contact our office and allow 72 hours for refills to be completed.    Today you received the following chemotherapy and/or immunotherapy, Keytruda   To help prevent nausea and vomiting after your treatment, we encourage you to take your nausea medication as directed.  BELOW ARE SYMPTOMS THAT SHOULD BE REPORTED IMMEDIATELY: *FEVER GREATER THAN 100.4 F (38 C) OR HIGHER *CHILLS OR SWEATING *NAUSEA AND VOMITING THAT IS NOT CONTROLLED WITH YOUR NAUSEA MEDICATION *UNUSUAL SHORTNESS OF BREATH *UNUSUAL BRUISING OR BLEEDING *URINARY PROBLEMS (pain or burning when urinating, or frequent urination) *BOWEL PROBLEMS (unusual diarrhea, constipation, pain near the anus) TENDERNESS IN MOUTH AND THROAT WITH OR WITHOUT PRESENCE OF ULCERS (sore throat, sores in mouth, or a toothache) UNUSUAL RASH, SWELLING OR PAIN  UNUSUAL VAGINAL DISCHARGE OR ITCHING   Items with * indicate a potential emergency and should be followed up as soon as possible or go to the Emergency  Department if any problems should occur.  Please show the CHEMOTHERAPY ALERT CARD or IMMUNOTHERAPY ALERT CARD at check-in to the Emergency Department and triage nurse.  Should you have questions after your visit or need to cancel or reschedule your appointment, please contact [facility] [phone]  and follow the prompts.  Office hours are 8:00 a.m. to 4:30 p.m. Monday - Friday. Please note that voicemails left after 4:00 p.m. may not be returned until the following business day.  We are closed weekends and major holidays. You have access to a nurse at all times for urgent questions. Please call the main number to the clinic [phone] and follow the prompts.  For any non-urgent questions, you may also contact your provider using MyChart. We now offer e-Visits for anyone 73 and older to request care online for non-urgent symptoms. For details visit [email or web address].   Also download the MyChart app! Go to the app store, search "MyChart", open the app, select [place], and log in with your MyChart username and password.

## 2023-02-23 NOTE — Patient Instructions (Addendum)
[facility] Discharge Instructions   You were seen and examined today by Dr. Ellin Saba.  He reviewed the results of your lab work which are normal/stable.   He reviewed the results of your CT scans. They are showing in the chest, the lymph nodes have slightly grown in size. The CT of the neck shows a mixed response to treatment. Some lymph nodes have stayed the same size, some have shrunk, and some spots got larger. There are no new areas of cancer identified on this scan. We will continue the same treatment at this time as the growth of the lymph nodes is minimal.   We will proceed with your treatment today.   Return as scheduled.    Thank you for choosing [facility] at [facility] to provide your oncology and hematology care.  To afford each patient quality time with our provider, please arrive at least 15 minutes before your scheduled appointment time.   If you have a lab appointment with the Cancer Center please come in thru the Main Entrance and check in at the main information desk.  You need to re-schedule your appointment should you arrive 10 or more minutes late.  We strive to give you quality time with our providers, and arriving late affects you and other patients whose appointments are after yours.  Also, if you no show three or more times for appointments you may be dismissed from the clinic at the providers discretion.     Again, thank you for choosing [facility].  Our hope is that these requests will decrease the amount of time that you wait before being seen by our physicians.       _____________________________________________________________  Should you have questions after your visit to [facility], please contact our office at [phone] and follow the prompts.  Our office hours are 8:00 a.m. and 4:30 p.m. Monday - Friday.  Please note that voicemails left after 4:00 p.m. may not be  returned until the following business day.  We are closed weekends and major holidays.  You do have access to a nurse 24-7, just call the main number to the clinic [phone] and do not press any options, hold on the line and a nurse will answer the phone.    For prescription refill requests, have your pharmacy contact our office and allow 72 hours.    Due to Covid, you will need to wear a mask upon entering the hospital. If you do not have a mask, a mask will be given to you at the Main Entrance upon arrival. For doctor visits, patients may have 1 support person age 46 or older with them. For treatment visits, patients can not have anyone with them due to social distancing guidelines and our immunocompromised population.

## 2023-02-23 NOTE — Progress Notes (Signed)
Labs reviewed today with MD at office visit. Ok to treat today per MD.   Treatment given per orders. Patient tolerated it well without problems. Vitals stable and discharged home from clinic ambulatory. Follow up as scheduled.

## 2023-02-23 NOTE — Progress Notes (Signed)
Patient has been examined by Dr. Katragadda. Vital signs and labs have been reviewed by MD - ANC, Creatinine, LFTs, hemoglobin, and platelets are within treatment parameters per M.D. - pt may proceed with treatment.  Primary RN and pharmacy notified.  

## 2023-02-25 LAB — T4: T4, Total: 7 ug/dL (ref 4.5–12.0)

## 2023-03-04 ENCOUNTER — Encounter: Payer: Self-pay | Admitting: Family Medicine

## 2023-03-04 ENCOUNTER — Ambulatory Visit (INDEPENDENT_AMBULATORY_CARE_PROVIDER_SITE_OTHER): Payer: Medicare Other | Admitting: Family Medicine

## 2023-03-04 VITALS — BP 151/80 | HR 72 | Ht 67.0 in | Wt 185.0 lb

## 2023-03-04 DIAGNOSIS — I4819 Other persistent atrial fibrillation: Secondary | ICD-10-CM | POA: Diagnosis not present

## 2023-03-04 DIAGNOSIS — I10 Essential (primary) hypertension: Secondary | ICD-10-CM | POA: Diagnosis not present

## 2023-03-04 DIAGNOSIS — E785 Hyperlipidemia, unspecified: Secondary | ICD-10-CM

## 2023-03-04 DIAGNOSIS — Z7901 Long term (current) use of anticoagulants: Secondary | ICD-10-CM

## 2023-03-04 LAB — LIPID PANEL
Chol/HDL Ratio: 4 ratio (ref 0.0–5.0)
Cholesterol, Total: 193 mg/dL (ref 100–199)
HDL: 48 mg/dL (ref >39)
LDL Chol Calc (NIH): 122 mg/dL — ABNORMAL HIGH (ref 0–99)
Triglycerides: 129 mg/dL (ref 0–149)
VLDL Cholesterol Cal: 23 mg/dL (ref 5–40)

## 2023-03-04 LAB — CBC WITH DIFFERENTIAL/PLATELET
Basophils Absolute: 0.1 10*3/uL (ref 0.0–0.2)
Basos: 1 %
EOS (ABSOLUTE): 1.2 10*3/uL — ABNORMAL HIGH (ref 0.0–0.4)
Eos: 9 %
Hematocrit: 41.7 % (ref 37.5–51.0)
Hemoglobin: 14 g/dL (ref 13.0–17.7)
Immature Grans (Abs): 0 10*3/uL (ref 0.0–0.1)
Immature Granulocytes: 0 %
Lymphocytes Absolute: 5.4 10*3/uL — ABNORMAL HIGH (ref 0.7–3.1)
Lymphs: 42 %
MCH: 33.3 pg — ABNORMAL HIGH (ref 26.6–33.0)
MCHC: 33.6 g/dL (ref 31.5–35.7)
MCV: 99 fL — ABNORMAL HIGH (ref 79–97)
Monocytes Absolute: 0.7 10*3/uL (ref 0.1–0.9)
Monocytes: 6 %
Neutrophils Absolute: 5.5 10*3/uL (ref 1.4–7.0)
Neutrophils: 42 %
Platelets: 209 10*3/uL (ref 150–450)
RBC: 4.21 x10E6/uL (ref 4.14–5.80)
RDW: 11.6 % (ref 11.6–15.4)
WBC: 13 10*3/uL — ABNORMAL HIGH (ref 3.4–10.8)

## 2023-03-04 LAB — COAGUCHEK XS/INR WAIVED
INR: 1.7 — ABNORMAL HIGH (ref 0.9–1.1)
Prothrombin Time: 20.1 s

## 2023-03-04 NOTE — Progress Notes (Signed)
BP (!) 151/80   Pulse 72   Ht 5\' 7"  (1.702 m)   Wt 185 lb (83.9 kg)   SpO2 95%   BMI 28.98 kg/m    Subjective:   Patient ID: [name], male    DOB: Jun 03, 1932, 89 y.o.   MRN: [id number]  HPI: [name] is a 89 y.o. male presenting on 03/04/2023 for Medical Management of Chronic Issues and Atrial Fibrillation   HPI Hypertension Patient is currently on furosemide and diltiazem and metoprolol, and their blood pressure today is 151/80, he will keep a close eye on it at home but he is going through chemotherapy not adjusting much because we do not want him to become hypotensive. Patient denies any lightheadedness or dizziness. Patient denies headaches, blurred vision, chest pains, shortness of breath, or weakness. Denies any side effects from medication and is content with current medication.   Hyperlipidemia Patient is coming in for recheck of his hyperlipidemia. The patient is currently taking Zetia and lovastatin. They deny any issues with myalgias or history of liver damage from it. They deny any focal numbness or weakness or chest pain.   Coumadin recheck Target goal: 2.0-3.0 Reason on anticoagulation: A-fib Patient denies any bruising or bleeding or chest pain or palpitations   Relevant past medical, surgical, family and social history reviewed and updated as indicated. Interim medical history since our last visit reviewed. Allergies and medications reviewed and updated.  Review of Systems  Constitutional:  Positive for fatigue. Negative for chills and fever.  Respiratory:  Negative for shortness of breath and wheezing.   Cardiovascular:  Negative for chest pain and leg swelling.  Musculoskeletal:  Negative for back pain and gait problem.  Skin:  Negative for rash.  Neurological:  Positive for weakness (With chemo treatments).  All other systems reviewed and are negative.   Per HPI unless specifically indicated above   Allergies as of 03/04/2023       Reactions    Penicillins Hives, Rash   Has patient had a PCN reaction causing immediate rash, facial/tongue/throat swelling, SOB or lightheadedness with hypotension: Yes Has patient had a PCN reaction causing severe rash involving mucus membranes or skin necrosis: Yes Has patient had a PCN reaction that required hospitalization: No Has patient had a PCN reaction occurring within the last 10 years: No If all of the above answers are "NO", then may proceed with Cephalosporin use.   Hct [hydrochlorothiazide] Other (See Comments)   hyper   Statins Other (See Comments)   Myalgia, tolerates low dosages of lovastatin         Medication List        Accurate as of Mar 04, 2023 10:28 AM. If you have any questions, ask your nurse or doctor.          acetaminophen 500 MG tablet Commonly known as: TYLENOL Take 1,000 mg by mouth every 6 (six) hours as needed for moderate pain.   CARBOPLATIN IV Inject into the vein every 21 ( twenty-one) days.   diltiazem 120 MG 24 hr capsule Commonly known as: CARDIZEM CD TAKE ONE (1) CAPSULE EACH DAY   ezetimibe 10 MG tablet Commonly known as: ZETIA Take 1 tablet (10 mg total) by mouth daily.   finasteride 5 MG tablet Commonly known as: PROSCAR TAKE ONE (1) TABLET EACH DAY   fluticasone 50 MCG/ACT nasal spray Commonly known as: FLONASE Place 1 spray into both nostrils daily as needed for allergies or rhinitis.   furosemide  20 MG tablet Commonly known as: LASIX Take 1 tablet (20 mg total) by mouth daily.   lovastatin 20 MG tablet Commonly known as: MEVACOR TAKE 1/2 TABLET EVERY OTHER DAY   metoprolol tartrate 100 MG tablet Commonly known as: LOPRESSOR TAKE ONE (1) TABLET BY MOUTH TWO (2) TIMES DAILY   montelukast 10 MG tablet Commonly known as: SINGULAIR Take 1 tablet (10 mg total) by mouth at bedtime.   PACLITAXEL IV Inject into the vein every 21 ( twenty-one) days.   PEMBROLIZUMAB IV Inject into the vein every 21 ( twenty-one) days.    Vitamin D3 125 MCG (5000 UT) Caps Take 5,000 Units by mouth once a week.   warfarin 5 MG tablet Commonly known as: COUMADIN Take as directed by the anticoagulation clinic. If you are unsure how to take this medication, talk to your nurse or doctor. Original instructions: TAKE 1 TABLET ON SAT, SUN, TUES, & THURSTAKE 1/2 TABLET ON MON, WED, & FRIDAY         Objective:   BP (!) 151/80   Pulse 72   Ht 5\' 7"  (1.702 m)   Wt 185 lb (83.9 kg)   SpO2 95%   BMI 28.98 kg/m   Wt Readings from Last 3 Encounters:  03/04/23 185 lb (83.9 kg)  02/23/23 187 lb (84.8 kg)  02/03/23 188 lb (85.3 kg)    Physical Exam Vitals and nursing note reviewed.  Constitutional:      General: He is not in acute distress.    Appearance: He is well-developed. He is not diaphoretic.  Eyes:     General: No scleral icterus.    Conjunctiva/sclera: Conjunctivae normal.  Neck:     Thyroid: No thyromegaly.  Cardiovascular:     Rate and Rhythm: Normal rate and regular rhythm.     Heart sounds: Normal heart sounds. No murmur heard. Pulmonary:     Effort: Pulmonary effort is normal. No respiratory distress.     Breath sounds: Normal breath sounds. No wheezing.  Musculoskeletal:        General: No swelling. Normal range of motion.     Cervical back: Neck supple.  Lymphadenopathy:     Cervical: No cervical adenopathy.  Skin:    General: Skin is warm and dry.     Findings: No rash.  Neurological:     Mental Status: He is alert and oriented to person, place, and time.     Coordination: Coordination normal.  Psychiatric:        Behavior: Behavior normal.     Description   Take an extra half a tablet today and then continue weekly dose by taking 1 tablet every day except for Thursdays and Saturdays take 1/2 tablet  INR today is  1.7 (goal is 2-3)    Recheck in 4-6 weeks       Assessment & Plan:   Problem List Items Addressed This Visit       Cardiovascular and Mediastinum   Essential  hypertension   Atrial fibrillation, persistent (HCC) - Primary   Relevant Orders   CoaguChek XS/INR Waived   Lipid panel   CBC with Differential/Platelet     Other   Dyslipidemia   Relevant Orders   CoaguChek XS/INR Waived   Lipid panel   Long term current use of anticoagulant therapy   Relevant Orders   CoaguChek XS/INR Waived   Lipid panel   CBC with Differential/Platelet    Will adjust Coumadin, blood pressure slightly elevated but with him  going through chemo treatments and cancer were going to leave it for now because we do not want him to become hypotensive  No other changes, follow-up in 4 to 6 weeks Follow up plan: Return if symptoms worsen or fail to improve, for 4 to 6-week INR recheck.  Counseling provided for all of the vaccine components Orders Placed This Encounter  Procedures   CoaguChek XS/INR Waived   Lipid panel   CBC with Differential/Platelet    Arville Care, MD [facility] 03/04/2023, 10:28 AM

## 2023-03-11 ENCOUNTER — Other Ambulatory Visit: Payer: Self-pay

## 2023-03-11 MED ORDER — LOVASTATIN 40 MG PO TABS: 40.0000 mg | ORAL_TABLET | Freq: Every day | ORAL | 3 refills | Status: DC

## 2023-03-12 ENCOUNTER — Other Ambulatory Visit: Payer: Self-pay

## 2023-03-12 DIAGNOSIS — C4442 Squamous cell carcinoma of skin of scalp and neck: Secondary | ICD-10-CM

## 2023-03-15 ENCOUNTER — Inpatient Hospital Stay: Payer: Medicare Other

## 2023-03-15 ENCOUNTER — Inpatient Hospital Stay: Payer: Medicare Other | Attending: Hematology

## 2023-03-15 ENCOUNTER — Inpatient Hospital Stay: Payer: Medicare Other | Admitting: Hematology

## 2023-03-15 VITALS — BP 142/88 | HR 61 | Temp 97.5°F | Resp 18

## 2023-03-15 DIAGNOSIS — Z5112 Encounter for antineoplastic immunotherapy: Secondary | ICD-10-CM | POA: Diagnosis not present

## 2023-03-15 DIAGNOSIS — C4442 Squamous cell carcinoma of skin of scalp and neck: Secondary | ICD-10-CM

## 2023-03-15 DIAGNOSIS — C760 Malignant neoplasm of head, face and neck: Secondary | ICD-10-CM | POA: Insufficient documentation

## 2023-03-15 DIAGNOSIS — Z95828 Presence of other vascular implants and grafts: Secondary | ICD-10-CM

## 2023-03-15 DIAGNOSIS — R918 Other nonspecific abnormal finding of lung field: Secondary | ICD-10-CM | POA: Diagnosis not present

## 2023-03-15 DIAGNOSIS — Z79620 Long term (current) use of immunosuppressive biologic: Secondary | ICD-10-CM | POA: Diagnosis not present

## 2023-03-15 LAB — CBC WITH DIFFERENTIAL/PLATELET
Abs Immature Granulocytes: 0.06 10*3/uL (ref 0.00–0.07)
Basophils Absolute: 0.1 10*3/uL (ref 0.0–0.1)
Basophils Relative: 1 %
Eosinophils Absolute: 1.2 10*3/uL — ABNORMAL HIGH (ref 0.0–0.5)
Eosinophils Relative: 9 %
HCT: 39.6 % (ref 39.0–52.0)
Hemoglobin: 12.9 g/dL — ABNORMAL LOW (ref 13.0–17.0)
Immature Granulocytes: 1 %
Lymphocytes Relative: 38 %
Lymphs Abs: 4.7 10*3/uL — ABNORMAL HIGH (ref 0.7–4.0)
MCH: 33.6 pg (ref 26.0–34.0)
MCHC: 32.6 g/dL (ref 30.0–36.0)
MCV: 103.1 fL — ABNORMAL HIGH (ref 80.0–100.0)
Monocytes Absolute: 0.9 10*3/uL (ref 0.1–1.0)
Monocytes Relative: 7 %
Neutro Abs: 5.5 10*3/uL (ref 1.7–7.7)
Neutrophils Relative %: 44 %
Platelets: 179 10*3/uL (ref 150–400)
RBC: 3.84 MIL/uL — ABNORMAL LOW (ref 4.22–5.81)
RDW: 12.4 % (ref 11.5–15.5)
WBC: 12.4 10*3/uL — ABNORMAL HIGH (ref 4.0–10.5)
nRBC: 0 % (ref 0.0–0.2)

## 2023-03-15 LAB — COMPREHENSIVE METABOLIC PANEL WITH GFR
ALT: 14 U/L (ref 0–44)
AST: 16 U/L (ref 15–41)
Albumin: 3.7 g/dL (ref 3.5–5.0)
Alkaline Phosphatase: 60 U/L (ref 38–126)
Anion gap: 7 (ref 5–15)
BUN: 19 mg/dL (ref 8–23)
CO2: 26 mmol/L (ref 22–32)
Calcium: 8.8 mg/dL — ABNORMAL LOW (ref 8.9–10.3)
Chloride: 104 mmol/L (ref 98–111)
Creatinine, Ser: 1.09 mg/dL (ref 0.61–1.24)
GFR, Estimated: >60 mL/min
Glucose, Bld: 91 mg/dL (ref 70–99)
Potassium: 4.1 mmol/L (ref 3.5–5.1)
Sodium: 137 mmol/L (ref 135–145)
Total Bilirubin: 0.9 mg/dL (ref 0.3–1.2)
Total Protein: 7 g/dL (ref 6.5–8.1)

## 2023-03-15 LAB — TSH: TSH: 1.058 u[IU]/mL (ref 0.350–4.500)

## 2023-03-15 LAB — MAGNESIUM: Magnesium: 2.1 mg/dL (ref 1.7–2.4)

## 2023-03-15 MED ORDER — SODIUM CHLORIDE 0.9 % IV SOLN
200.0000 mg | Freq: Once | INTRAVENOUS | Status: AC
  Administered 2023-03-15: 200 mg via INTRAVENOUS
  Filled 2023-03-15: qty 8

## 2023-03-15 MED ORDER — SODIUM CHLORIDE 0.9% FLUSH
10.0000 mL | INTRAVENOUS | Status: DC | PRN
  Administered 2023-03-15: 10 mL

## 2023-03-15 MED ORDER — HEPARIN SOD (PORK) LOCK FLUSH 100 UNIT/ML IV SOLN
500.0000 [IU] | Freq: Once | INTRAVENOUS | Status: AC | PRN
  Administered 2023-03-15: 500 [IU]

## 2023-03-15 MED ORDER — SODIUM CHLORIDE 0.9 % IV SOLN: Freq: Once | INTRAVENOUS | Status: AC

## 2023-03-15 MED ORDER — SODIUM CHLORIDE 0.9% FLUSH
10.0000 mL | Freq: Once | INTRAVENOUS | Status: AC
  Administered 2023-03-15: 10 mL via INTRAVENOUS

## 2023-03-15 NOTE — Patient Instructions (Signed)
[facility]  Discharge Instructions: Thank you for choosing [facility] to provide your oncology and hematology care.  If you have a lab appointment with the Cancer Center - please note that after April 8th, 2024, all labs will be drawn in the cancer center.  You do not have to check in or register with the main entrance as you have in the past but will complete your check-in in the cancer center.  Wear comfortable clothing and clothing appropriate for easy access to any Portacath or PICC line.   We strive to give you quality time with your provider. You may need to reschedule your appointment if you arrive late (15 or more minutes).  Arriving late affects you and other patients whose appointments are after yours.  Also, if you miss three or more appointments without notifying the office, you may be dismissed from the clinic at the provider's discretion.      For prescription refill requests, have your pharmacy contact our office and allow 72 hours for refills to be completed.    Today you received the following chemotherapy and/or immunotherapy agents Keytruda. Pembrolizumab Injection What is this medication? PEMBROLIZUMAB (PEM broe LIZ ue mab) treats some types of cancer. It works by helping your immune system slow or stop the spread of cancer cells. It is a monoclonal antibody. This medicine may be used for other purposes; ask your health care provider or pharmacist if you have questions. COMMON BRAND NAME(S): Keytruda What should I tell my care team before I take this medication? They need to know if you have any of these conditions: Allogeneic stem cell transplant (uses someone else's stem cells) Autoimmune diseases, such as Crohn disease, ulcerative colitis, lupus History of chest radiation Nervous system problems, such as Guillain-Barre syndrome, myasthenia gravis Organ transplant An unusual or allergic reaction to pembrolizumab, other medications,  foods, dyes, or preservatives Pregnant or trying to get pregnant Breast-feeding How should I use this medication? This medication is injected into a vein. It is given by your care team in a hospital or clinic setting. A special MedGuide will be given to you before each treatment. Be sure to read this information carefully each time. Talk to your care team about the use of this medication in children. While it may be prescribed for children as young as 6 months for selected conditions, precautions do apply. Overdosage: If you think you have taken too much of this medicine contact a poison control center or emergency room at once. NOTE: This medicine is only for you. Do not share this medicine with others. What if I miss a dose? Keep appointments for follow-up doses. It is important not to miss your dose. Call your care team if you are unable to keep an appointment. What may interact with this medication? Interactions have not been studied. This list may not describe all possible interactions. Give your health care provider a list of all the medicines, herbs, non-prescription drugs, or dietary supplements you use. Also tell them if you smoke, drink alcohol, or use illegal drugs. Some items may interact with your medicine. What should I watch for while using this medication? Your condition will be monitored carefully while you are receiving this medication. You may need blood work while taking this medication. This medication may cause serious skin reactions. They can happen weeks to months after starting the medication. Contact your care team right away if you notice fevers or flu-like symptoms with a rash. The rash   may be red or purple and then turn into blisters or peeling of the skin. You may also notice a red rash with swelling of the face, lips, or lymph nodes in your neck or under your arms. Tell your care team right away if you have any change in your eyesight. Talk to your care team if you  may be pregnant. Serious birth defects can occur if you take this medication during pregnancy and for 4 months after the last dose. You will need a negative pregnancy test before starting this medication. Contraception is recommended while taking this medication and for 4 months after the last dose. Your care team can help you find the option that works for you. Do not breastfeed while taking this medication and for 4 months after the last dose. What side effects may I notice from receiving this medication? Side effects that you should report to your care team as soon as possible: Allergic reactions--skin rash, itching, hives, swelling of the face, lips, tongue, or throat Dry cough, shortness of breath or trouble breathing Eye pain, redness, irritation, or discharge with blurry or decreased vision Heart muscle inflammation--unusual weakness or fatigue, shortness of breath, chest pain, fast or irregular heartbeat, dizziness, swelling of the ankles, feet, or hands Hormone gland problems--headache, sensitivity to light, unusual weakness or fatigue, dizziness, fast or irregular heartbeat, increased sensitivity to cold or heat, excessive sweating, constipation, hair loss, increased thirst or amount of urine, tremors or shaking, irritability Infusion reactions--chest pain, shortness of breath or trouble breathing, feeling faint or lightheaded Kidney injury (glomerulonephritis)--decrease in the amount of urine, red or dark [name] urine, foamy or bubbly urine, swelling of the ankles, hands, or feet Liver injury--right upper belly pain, loss of appetite, nausea, light-colored stool, dark yellow or [name] urine, yellowing skin or eyes, unusual weakness or fatigue Pain, tingling, or numbness in the hands or feet, muscle weakness, change in vision, confusion or trouble speaking, loss of balance or coordination, trouble walking, seizures Rash, fever, and swollen lymph nodes Redness, blistering, peeling, or loosening  of the skin, including inside the mouth Sudden or severe stomach pain, bloody diarrhea, fever, nausea, vomiting Side effects that usually do not require medical attention (report to your care team if they continue or are bothersome): Bone, joint, or muscle pain Diarrhea Fatigue Loss of appetite Nausea Skin rash This list may not describe all possible side effects. Call your doctor for medical advice about side effects. You may report side effects to FDA at 1-800-FDA-1088. Where should I keep my medication? This medication is given in a hospital or clinic. It will not be stored at home. NOTE: This sheet is a summary. It may not cover all possible information. If you have questions about this medicine, talk to your doctor, pharmacist, or health care provider.  2024 Elsevier/Gold Standard (2022-02-10 00:00:00)       To help prevent nausea and vomiting after your treatment, we encourage you to take your nausea medication as directed.  BELOW ARE SYMPTOMS THAT SHOULD BE REPORTED IMMEDIATELY: *FEVER GREATER THAN 100.4 F (38 C) OR HIGHER *CHILLS OR SWEATING *NAUSEA AND VOMITING THAT IS NOT CONTROLLED WITH YOUR NAUSEA MEDICATION *UNUSUAL SHORTNESS OF BREATH *UNUSUAL BRUISING OR BLEEDING *URINARY PROBLEMS (pain or burning when urinating, or frequent urination) *BOWEL PROBLEMS (unusual diarrhea, constipation, pain near the anus) TENDERNESS IN MOUTH AND THROAT WITH OR WITHOUT PRESENCE OF ULCERS (sore throat, sores in mouth, or a toothache) UNUSUAL RASH, SWELLING OR PAIN  UNUSUAL VAGINAL DISCHARGE OR ITCHING     Items with * indicate a potential emergency and should be followed up as soon as possible or go to the Emergency Department if any problems should occur.  Please show the CHEMOTHERAPY ALERT CARD or IMMUNOTHERAPY ALERT CARD at check-in to the Emergency Department and triage nurse.  Should you have questions after your visit or need to cancel or reschedule your appointment, please contact  [facility] [phone]  and follow the prompts.  Office hours are 8:00 a.m. to 4:30 p.m. Monday - Friday. Please note that voicemails left after 4:00 p.m. may not be returned until the following business day.  We are closed weekends and major holidays. You have access to a nurse at all times for urgent questions. Please call the main number to the clinic [phone] and follow the prompts.  For any non-urgent questions, you may also contact your provider using MyChart. We now offer e-Visits for anyone 18 and older to request care online for non-urgent symptoms. For details visit [email or web address].   Also download the MyChart app! Go to the app store, search "MyChart", open the app, select [facility], and log in with your MyChart username and password.   

## 2023-03-15 NOTE — Progress Notes (Signed)
Patient presents today for chemotherapy infusion.  Patient is in satisfactory condition with no new complaints voiced.  Vital signs are stable.  Labs reviewed and all labs are within treatment parameters.  We will proceed with treatment per MD orders.    Patient tolerated treatment well with no complaints voiced.  Patient left ambulatory with wife in stable condition.  Vital signs stable at discharge.  Follow up as scheduled.

## 2023-04-04 NOTE — Progress Notes (Signed)
[facility] [address] [place] [address]    Clinic Day:  04/05/2023  Referring physician: [name], Elige Radon, MD  Patient Care Team: [name], Elige Radon, MD as PCP - General (Family Medicine) [name], MD as PCP - Cardiology (Cardiology) Bjorn Pippin, MD as Attending Physician (Urology) [name], MD as Consulting Physician (Cardiology) [name], MD (Inactive) as Consulting Physician (Gastroenterology) [name], MD as Consulting Physician (Ophthalmology) [name], MD as Consulting Physician (Orthopedic Surgery) [name], [name], LCSW as [facility] Care Management (Licensed Clinical Social Worker) [name], MD as Medical Oncologist (Medical Oncology) [name], RN as Oncology Nurse Navigator (Medical Oncology)   ASSESSMENT & PLAN:   Assessment: 1.  Right supraclavicular lymphadenopathy and multiple lung nodules: - Patient noticed right neck mass since Christmas 2022.  10 to 15 pound weight loss since January 2023.  Denies any dysphagia or odynophagia. - CT neck (05/27/2022): Numerous lymph nodes in the right neck, right supraclavicular lymph node mass measures 5.7 x 3.7 cm.  18 mm posterior lymph node in the right lower neck.  Numerous lymph nodes in the right lower and posterior neck approximately 1 cm.  Many have internal necrosis consistent with metastatic disease.  20 mm lymph node just above the right clavicle.  Left level 2 node 12 mm. - CT CAP (05/28/2022): Several bilateral lung nodules, RUL nodule measuring 1.8 x 1.3 cm.  Small clusters of nodules in the medial left upper lobe.  Irregular nodular density along the left side of the mediastinum measuring 2.1 cm.  No metastatic disease in the abdomen or pelvis.  Liver is slightly nodular contour. - Lab work shows elevated white count since 2009, predominantly lymphocytosis. - Pathology (06/09/2022): Metastatic carcinoma positive for p40, p63  and CK5/6-patchy positivity with CK7.  Negative for TTF-1, Napsin A, PAX8, prostein, PSA, CK20 and CDX2.  - PET scan (06/11/2022): Bulky right supraclavicular nodal mass, smaller hypermetabolic right posterior triangle and jugular lymph nodes and single hypermetabolic left level 3 lymph node.  Bilateral hypermetabolic pulmonary nodules and metastatic pattern.  Minimal hypermetabolic mediastinal nodal metastasis.  Cluster of hypermetabolic right axillary lymph nodes.  No evidence of metastatic disease or primary lesion in the abdomen or pelvis.  No bone lesions. - MRI of the brain: Chronic small vessel ischemic changes with no evidence of metastatic disease. - NGS: T p53 pathogenic variant, MSI-stable, TMB-low, LOH-low, PD-L1 (XB147): Negative, p16 and p18 negative - Cancer type ID: 96% probability squamous cell carcinoma, subtype head and neck/skin.  Cancer types ruled out with 95% confidence includes skin basal cell carcinoma. - Carboplatin, Taxol and pembrolizumab 07/23/2022 through 11/09/22   2.  Social/family history: - He lives at home with his wife.  Independent of ADLs and IADLs.  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  May have had asbestos exposure 30 years ago.  Quit smoking cigarettes in 1965.  Smoked 1 pack/day for less than 12 years.  2 of his brothers had lung cancer and both were smokers.    Plan: 1.  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  CT chest showed stable lung nodules with mildly increased adenopathy. - He does not have any immunotherapy related side effects.  However he has  fatigue and some stiffness in the left knee which gets better on walking. - Reviewed labs today: Normal LFTs and creatinine.  CBC grossly normal.  Last TSH is  1.058. - Recommend proceeding with treatment today with Keytruda.  RTC 3 weeks for follow-up.  Will plan to repeat CT soft tissue neck, CAP with contrast prior to next visit.   2.  Neuropathy: - Leg pains from neuropathy have been stable but improved since paclitaxel discontinued. - Continue Tylenol as needed.    Orders Placed This Encounter  Procedures   CT SOFT TISSUE NECK W CONTRAST    Standing Status:   Future    Standing Expiration Date:   04/04/2024    Order Specific Question:   If indicated for the ordered procedure, I authorize the administration of contrast media per Radiology protocol    Answer:   Yes    Order Specific Question:   Does the patient have a contrast media/X-ray dye allergy?    Answer:   No    Order Specific Question:   Preferred imaging location?    Answer:   [facility]   CT CHEST ABDOMEN PELVIS W CONTRAST    Standing Status:   Future    Standing Expiration Date:   04/04/2024    Order Specific Question:   If indicated for the ordered procedure, I authorize the administration of contrast media per Radiology protocol    Answer:   Yes    Order Specific Question:   Does the patient have a contrast media/X-ray dye allergy?    Answer:   No    Order Specific Question:   Preferred imaging location?    Answer:   [facility]    Order Specific Question:   If indicated for the ordered procedure, I authorize the administration of oral contrast media per Radiology protocol    Answer:   Yes      I,Katie Daubenspeck,acting as a scribe for [name], MD.,have documented all relevant documentation on the behalf of [name], MD,as directed by  [name], MD while in the presence of [name], MD.   I, [name] MD, have reviewed the above documentation for accuracy and completeness, and I agree with the above.   [name], MD   6/24/20242:13 PM  CHIEF COMPLAINT:   Diagnosis: poorly  differentiated squamous cell carcinoma    Cancer Staging  Squamous cell carcinoma of head and neck Staging form: Cervical Lymph Nodes and Unknown Primary Tumors of the Head and Neck, AJCC 8th Edition - Clinical stage from 07/16/2022: Stage IVC (cT0, cN3b, cM1) - Unsigned    Prior Therapy: Carboplatin, paclitaxel and pembrolizumab, 07/23/22 - 11/09/22  Current Therapy:  pembrolizumab alone    HISTORY OF PRESENT ILLNESS:   Oncology History  Squamous cell carcinoma of head and neck  07/16/2022 Initial Diagnosis   Squamous cell carcinoma of head and neck (HCC)   07/23/2022 -  Chemotherapy   Patient is on Treatment Plan : Carboplatin  + Paclitaxel  + Pembrolizumab (200) D1 q21d        INTERVAL HISTORY:   Ha is a 89 y.o. male presenting to clinic today for follow up of poorly differentiated squamous cell carcinoma. He was last seen by me on 02/23/23.  Today, he states that he is doing well overall. His appetite level is at 100%. His energy level is at 10%.  PAST MEDICAL HISTORY:   Past Medical History: Past Medical History:  Diagnosis Date  Anemia    Atrial fibrillation (HCC)    Cataract    Cellulitis    In past   Complication of anesthesia    HARD TIME WAKING; CAUSES MY BP TO GO UP    Coronary artery disease    5 bypasses   DJD (degenerative joint disease)    Ejection fraction < 50%    Mildly reduced, 40% by echo   GERD (gastroesophageal reflux disease)    Hyperlipidemia    Hypertension    Persistent atrial fibrillation (HCC)    Prostate hypertrophy    on CT scan 09/2014   PUD (peptic ulcer disease)    RESOLVED    Skin cancer of eyelid    Resected    Surgical History: Past Surgical History:  Procedure Laterality Date   APPENDECTOMY     BYPASS GRAFT  2005   CATARACT EXTRACTION W/PHACO Left 07/22/2015   Procedure: CATARACT EXTRACTION PHACO AND INTRAOCULAR LENS PLACEMENT LEFT EYE CDE=8.14;  Surgeon: [name], MD;  Location: [facility];  Service: Ophthalmology;   Laterality: Left;   CATARACT EXTRACTION W/PHACO Right 08/18/2019   Procedure: CATARACT EXTRACTION PHACO AND INTRAOCULAR LENS PLACEMENT (IOC);  Surgeon: Fabio Pierce, MD;  Location: [facility];  Service: Ophthalmology;  Laterality: Right;  CDE: 9.74   CORONARY ARTERY BYPASS GRAFT  4/05   LIMA to LAD, SVG to PDA, SVG to ramus intermediate   EYE SURGERY  07/2015   EYELID CARCINOMA EXCISION     Skin cancer resected   Heart bypass  2005   LYMPH NODE BIOPSY Right 06/09/2022   Procedure: LYMPH NODE BIOPSY; supraclavicular;  Surgeon: Lucretia Roers, MD;  Location: [facility];  Service: General;  Laterality: Right;   PORTACATH PLACEMENT Left 06/09/2022   Procedure: INSERTION PORT-A-CATH;  Surgeon: Lucretia Roers, MD;  Location: [facility];  Service: General;  Laterality: Left;   TOTAL HIP ARTHROPLASTY     Right   TOTAL HIP ARTHROPLASTY Left 05/04/2018   Procedure: LEFT TOTAL HIP ARTHROPLASTY;  Surgeon: [name], MD;  Location: [facility];  Service: Orthopedics;  Laterality: Left;   TOTAL KNEE ARTHROPLASTY     Right    Social History: Social History   Socioeconomic History   Marital status: Married    Spouse name: Talbert Forest   Number of children: 1   Years of education: 10   Highest education level: 10th grade  Occupational History   Occupation: retired  Tobacco Use   Smoking status: Former    Packs/day: 2.00    Years: 5.00    Additional pack years: 0.00    Total pack years: 10.00    Types: Cigarettes    Quit date: 12/23/1963    Years since quitting: 59.3   Smokeless tobacco: Never  Vaping Use   Vaping Use: Never used  Substance and Sexual Activity   Alcohol use: No   Drug use: No   Sexual activity: Yes  Other Topics Concern   Not on file  Social History Narrative   Lives in a split level home.   Social Determinants of Health   Financial Resource Strain: Low Risk  (01/31/2023)   Overall Financial Resource Strain (CARDIA)    Difficulty of Paying Living Expenses: Not hard at all   Food Insecurity: No Food Insecurity (01/31/2023)   Hunger Vital Sign    Worried About Running Out of Food in the Last Year: Never true    Ran Out of Food in the Last Year: Never true  Transportation Needs: No  Transportation Needs (01/31/2023)   PRAPARE - Administrator, Civil Service (Medical): No    Lack of Transportation (Non-Medical): No  Physical Activity: Inactive (01/31/2023)   Exercise Vital Sign    Days of Exercise per Week: 0 days    Minutes of Exercise per Session: 30 min  Stress: No Stress Concern Present (01/31/2023)   [employer] of Occupational Health - Occupational Stress Questionnaire    Feeling of Stress : Only a little  Social Connections: Socially Isolated (01/31/2023)   Social Connection and Isolation Panel [NHANES]    Frequency of Communication with Friends and Family: Once a week    Frequency of Social Gatherings with Friends and Family: Once a week    Attends Religious Services: Never    Database administrator or Organizations: No    Attends Banker Meetings: Never    Marital Status: Married  Catering manager Violence: Not At Risk (01/06/2023)   Humiliation, Afraid, Rape, and Kick questionnaire    Fear of Current or Ex-Partner: No    Emotionally Abused: No    Physically Abused: No    Sexually Abused: No    Family History: Family History  Problem Relation Age of Onset   Stroke Mother    Stroke Father    Hypertension Father    Early death Brother        29 months old   Cancer Brother        lung   Stroke Brother        heat    Current Medications:  Current Outpatient Medications:    acetaminophen (TYLENOL) 500 MG tablet, Take 1,000 mg by mouth every 6 (six) hours as needed for moderate pain., Disp: , Rfl:    CARBOPLATIN IV, Inject into the vein every 21 ( twenty-one) days., Disp: , Rfl:    Cholecalciferol (VITAMIN D3) 5000 units CAPS, Take 5,000 Units by mouth once a week. , Disp: , Rfl:    diltiazem (CARDIZEM CD) 120  MG 24 hr capsule, TAKE ONE (1) CAPSULE EACH DAY, Disp: 90 capsule, Rfl: 3   ezetimibe (ZETIA) 10 MG tablet, Take 1 tablet (10 mg total) by mouth daily., Disp: 90 tablet, Rfl: 3   finasteride (PROSCAR) 5 MG tablet, TAKE ONE (1) TABLET EACH DAY, Disp: 90 tablet, Rfl: 3   fluticasone (FLONASE) 50 MCG/ACT nasal spray, Place 1 spray into both nostrils daily as needed for allergies or rhinitis., Disp: , Rfl:    furosemide (LASIX) 20 MG tablet, Take 1 tablet (20 mg total) by mouth daily., Disp: 90 tablet, Rfl: 3   lovastatin (MEVACOR) 40 MG tablet, Take 1 tablet (40 mg total) by mouth at bedtime., Disp: 90 tablet, Rfl: 3   metoprolol tartrate (LOPRESSOR) 100 MG tablet, TAKE ONE (1) TABLET BY MOUTH TWO (2) TIMES DAILY, Disp: 180 tablet, Rfl: 3   montelukast (SINGULAIR) 10 MG tablet, Take 1 tablet (10 mg total) by mouth at bedtime., Disp: 90 tablet, Rfl: 3   PACLITAXEL IV, Inject into the vein every 21 ( twenty-one) days., Disp: , Rfl:    PEMBROLIZUMAB IV, Inject into the vein every 21 ( twenty-one) days., Disp: , Rfl:    warfarin (COUMADIN) 5 MG tablet, TAKE 1 TABLET ON SAT, SUN, TUES, & THURSTAKE 1/2 TABLET ON MON, WED, & FRIDAY, Disp: 70 tablet, Rfl: 3 No current facility-administered medications for this visit.  Facility-Administered Medications Ordered in Other Visits:    heparin lock flush 100 unit/mL, 500 Units, Intracatheter, Once  PRN, [name], MD   pembrolizumab [facility]) 200 mg in sodium chloride 0.9 % 50 mL chemo infusion, 200 mg, Intravenous, Once, [name], MD   sodium chloride flush (NS) 0.9 % injection 10 mL, 10 mL, Intracatheter, PRN, [name], MD   Allergies: Allergies  Allergen Reactions   Penicillins Hives and Rash    Has patient had a PCN reaction causing immediate rash, facial/tongue/throat swelling, SOB or lightheadedness with hypotension: Yes Has patient had a PCN reaction causing severe rash involving mucus membranes or skin necrosis:  Yes Has patient had a PCN reaction that required hospitalization: No Has patient had a PCN reaction occurring within the last 10 years: No If all of the above answers are "NO", then may proceed with Cephalosporin use.    Hct [Hydrochlorothiazide] Other (See Comments)    hyper   Statins Other (See Comments)    Myalgia, tolerates low dosages of lovastatin     REVIEW OF SYSTEMS:   Review of Systems  Constitutional:  Negative for chills, fatigue and fever.  HENT:   Negative for lump/mass, mouth sores, nosebleeds, sore throat and trouble swallowing.   Eyes:  Negative for eye problems.  Respiratory:  Negative for cough and shortness of breath.   Cardiovascular:  Positive for chest pain. Negative for leg swelling and palpitations.  Gastrointestinal:  Negative for abdominal pain, constipation, diarrhea, nausea and vomiting.  Genitourinary:  Negative for bladder incontinence, difficulty urinating, dysuria, frequency, hematuria and nocturia.   Musculoskeletal:  Negative for arthralgias, back pain, flank pain, myalgias and neck pain.  Skin:  Negative for itching and rash.  Neurological:  Positive for dizziness, headaches and numbness.  Hematological:  Does not bruise/bleed easily.  Psychiatric/Behavioral:  Positive for sleep disturbance. Negative for depression and suicidal ideas. The patient is not nervous/anxious.   All other systems reviewed and are negative.    VITALS:   There were no vitals taken for this visit.  Wt Readings from Last 3 Encounters:  04/05/23 184 lb 3.2 oz (83.6 kg)  03/15/23 187 lb (84.8 kg)  03/04/23 185 lb (83.9 kg)    There is no height or weight on file to calculate BMI.  Performance status (ECOG): 1 - Symptomatic but completely ambulatory  PHYSICAL EXAM:   Physical Exam Vitals and nursing note reviewed. Exam conducted with a chaperone present.  Constitutional:      Appearance: Normal appearance.  Cardiovascular:     Rate and Rhythm: Normal rate and  regular rhythm.     Pulses: Normal pulses.     Heart sounds: Normal heart sounds.  Pulmonary:     Effort: Pulmonary effort is normal.     Breath sounds: Normal breath sounds.  Abdominal:     Palpations: Abdomen is soft. There is no hepatomegaly, splenomegaly or mass.     Tenderness: There is no abdominal tenderness.  Musculoskeletal:     Right lower leg: No edema.     Left lower leg: No edema.  Lymphadenopathy:     Cervical: No cervical adenopathy.     Right cervical: No superficial, deep or posterior cervical adenopathy.    Left cervical: No superficial, deep or posterior cervical adenopathy.     Upper Body:     Right upper body: No supraclavicular or axillary adenopathy.     Left upper body: No supraclavicular or axillary adenopathy.  Neurological:     General: No focal deficit present.     Mental Status: He is alert and oriented to person, place, and  time.  Psychiatric:        Mood and Affect: Mood normal.        Behavior: Behavior normal.     LABS:      Latest Ref Rng & Units 04/05/2023   12:00 PM 03/15/2023   11:25 AM 03/04/2023   10:17 AM  CBC  WBC 4.0 - 10.5 K/uL 13.6  12.4  13.0   Hemoglobin 13.0 - 17.0 g/dL 62.7  03.5  00.9   Hematocrit 39.0 - 52.0 % 40.7  39.6  41.7   Platelets 150 - 400 K/uL 203  179  209       Latest Ref Rng & Units 04/05/2023   12:00 PM 03/15/2023   11:25 AM 02/23/2023   11:41 AM  CMP  Glucose 70 - 99 mg/dL 90  91  92   BUN 8 - 23 mg/dL 22  19  17    Creatinine 0.61 - 1.24 mg/dL 3.81  8.29  9.37   Sodium 135 - 145 mmol/L 137  137  137   Potassium 3.5 - 5.1 mmol/L 4.2  4.1  4.1   Chloride 98 - 111 mmol/L 103  104  105   CO2 22 - 32 mmol/L 26  26  26    Calcium 8.9 - 10.3 mg/dL 9.0  8.8  8.8   Total Protein 6.5 - 8.1 g/dL 7.2  7.0  6.9   Total Bilirubin 0.3 - 1.2 mg/dL 0.8  0.9  0.6   Alkaline Phos 38 - 126 U/L 59  60  64   AST 15 - 41 U/L 17  16  19    ALT 0 - 44 U/L 15  14  15       Lab Results  Component Value Date   CEA1 5.4 (H)  06/03/2022   /  CEA  Date Value Ref Range Status  06/03/2022 5.4 (H) 0.0 - 4.7 ng/mL Final    Comment:    (NOTE)                             Nonsmokers          <3.9                             Smokers             <5.6 Roche [employer]ostics Electrochemiluminescence Immunoassay (ECLIA) Values obtained with different assay methods or kits cannot be used interchangeably.  Results cannot be interpreted as absolute evidence of the presence or absence of malignant disease. Performed At: [facility] [address] [place], [place] [id number] Jolene Schimke MD [id number]    Lab Results  Component Value Date   PSA1 11.3 (H) 02/16/2022   Lab Results  Component Value Date   CAN199 9 (H) 06/03/2022   No results found for: "CAN125"  No results found for: "TOTALPROTELP", "ALBUMINELP", "A1GS", "A2GS", "BETS", "BETA2SER", "GAMS", "MSPIKE", "SPEI" No results found for: "TIBC", "FERRITIN", "IRONPCTSAT" Lab Results  Component Value Date   LDH 164 06/03/2022     STUDIES:   No results found.

## 2023-04-05 ENCOUNTER — Inpatient Hospital Stay: Payer: Medicare Other

## 2023-04-05 ENCOUNTER — Inpatient Hospital Stay (HOSPITAL_BASED_OUTPATIENT_CLINIC_OR_DEPARTMENT_OTHER): Payer: Medicare Other | Admitting: Hematology

## 2023-04-05 VITALS — BP 146/90 | HR 73 | Temp 97.6°F | Resp 18

## 2023-04-05 DIAGNOSIS — C4442 Squamous cell carcinoma of skin of scalp and neck: Secondary | ICD-10-CM

## 2023-04-05 DIAGNOSIS — Z5112 Encounter for antineoplastic immunotherapy: Secondary | ICD-10-CM | POA: Diagnosis not present

## 2023-04-05 DIAGNOSIS — Z79620 Long term (current) use of immunosuppressive biologic: Secondary | ICD-10-CM | POA: Diagnosis not present

## 2023-04-05 DIAGNOSIS — Z95828 Presence of other vascular implants and grafts: Secondary | ICD-10-CM

## 2023-04-05 DIAGNOSIS — R918 Other nonspecific abnormal finding of lung field: Secondary | ICD-10-CM | POA: Diagnosis not present

## 2023-04-05 DIAGNOSIS — C760 Malignant neoplasm of head, face and neck: Secondary | ICD-10-CM | POA: Diagnosis not present

## 2023-04-05 LAB — MAGNESIUM: Magnesium: 2.2 mg/dL (ref 1.7–2.4)

## 2023-04-05 LAB — COMPREHENSIVE METABOLIC PANEL
ALT: 15 U/L (ref 0–44)
AST: 17 U/L (ref 15–41)
Albumin: 3.9 g/dL (ref 3.5–5.0)
Alkaline Phosphatase: 59 U/L (ref 38–126)
Anion gap: 8 (ref 5–15)
BUN: 22 mg/dL (ref 8–23)
CO2: 26 mmol/L (ref 22–32)
Calcium: 9 mg/dL (ref 8.9–10.3)
Chloride: 103 mmol/L (ref 98–111)
Creatinine, Ser: 1.17 mg/dL (ref 0.61–1.24)
GFR, Estimated: 60 mL/min — ABNORMAL LOW (ref >60)
Glucose, Bld: 90 mg/dL (ref 70–99)
Potassium: 4.2 mmol/L (ref 3.5–5.1)
Sodium: 137 mmol/L (ref 135–145)
Total Bilirubin: 0.8 mg/dL (ref 0.3–1.2)
Total Protein: 7.2 g/dL (ref 6.5–8.1)

## 2023-04-05 LAB — CBC WITH DIFFERENTIAL/PLATELET
Abs Immature Granulocytes: 0.05 10*3/uL (ref 0.00–0.07)
Basophils Absolute: 0.1 10*3/uL (ref 0.0–0.1)
Basophils Relative: 0 %
Eosinophils Absolute: 0.9 10*3/uL — ABNORMAL HIGH (ref 0.0–0.5)
Eosinophils Relative: 7 %
HCT: 40.7 % (ref 39.0–52.0)
Hemoglobin: 13.2 g/dL (ref 13.0–17.0)
Immature Granulocytes: 0 %
Lymphocytes Relative: 40 %
Lymphs Abs: 5.5 10*3/uL — ABNORMAL HIGH (ref 0.7–4.0)
MCH: 33.3 pg (ref 26.0–34.0)
MCHC: 32.4 g/dL (ref 30.0–36.0)
MCV: 102.8 fL — ABNORMAL HIGH (ref 80.0–100.0)
Monocytes Absolute: 0.8 10*3/uL (ref 0.1–1.0)
Monocytes Relative: 6 %
Neutro Abs: 6.3 10*3/uL (ref 1.7–7.7)
Neutrophils Relative %: 47 %
Platelets: 203 10*3/uL (ref 150–400)
RBC: 3.96 MIL/uL — ABNORMAL LOW (ref 4.22–5.81)
RDW: 12.6 % (ref 11.5–15.5)
WBC: 13.6 10*3/uL — ABNORMAL HIGH (ref 4.0–10.5)
nRBC: 0 % (ref 0.0–0.2)

## 2023-04-05 MED ORDER — SODIUM CHLORIDE 0.9 % IV SOLN
200.0000 mg | Freq: Once | INTRAVENOUS | Status: AC
  Administered 2023-04-05: 200 mg via INTRAVENOUS
  Filled 2023-04-05: qty 8

## 2023-04-05 MED ORDER — SODIUM CHLORIDE 0.9 % IV SOLN: Freq: Once | INTRAVENOUS | Status: AC

## 2023-04-05 MED ORDER — SODIUM CHLORIDE 0.9% FLUSH
10.0000 mL | INTRAVENOUS | Status: DC | PRN
  Administered 2023-04-05: 10 mL

## 2023-04-05 MED ORDER — SODIUM CHLORIDE 0.9% FLUSH
10.0000 mL | Freq: Once | INTRAVENOUS | Status: AC
  Administered 2023-04-05: 10 mL via INTRAVENOUS

## 2023-04-05 MED ORDER — HEPARIN SOD (PORK) LOCK FLUSH 100 UNIT/ML IV SOLN
500.0000 [IU] | Freq: Once | INTRAVENOUS | Status: AC | PRN
  Administered 2023-04-05: 500 [IU]

## 2023-04-05 NOTE — Patient Instructions (Signed)
[facility] at [facility] Discharge Instructions   You were seen and examined today by Dr. Katragadda.  He reviewed the results of your lab work which are normal/stable.   We will proceed with your treatment today.  Return as scheduled.    Thank you for choosing [facility] at [facility] to provide your oncology and hematology care.  To afford each patient quality time with our provider, please arrive at least 15 minutes before your scheduled appointment time.   If you have a lab appointment with the Cancer Center please come in thru the Main Entrance and check in at the main information desk.  You need to re-schedule your appointment should you arrive 10 or more minutes late.  We strive to give you quality time with our providers, and arriving late affects you and other patients whose appointments are after yours.  Also, if you no show three or more times for appointments you may be dismissed from the clinic at the providers discretion.     Again, thank you for choosing [facility].  Our hope is that these requests will decrease the amount of time that you wait before being seen by our physicians.       _____________________________________________________________  Should you have questions after your visit to [facility], please contact our office at [phone] and follow the prompts.  Our office hours are 8:00 a.m. and 4:30 p.m. Monday - Friday.  Please note that voicemails left after 4:00 p.m. may not be returned until the following business day.  We are closed weekends and major holidays.  You do have access to a nurse 24-7, just call the main number to the clinic [phone] and do not press any options, hold on the line and a nurse will answer the phone.    For prescription refill requests, have your pharmacy contact our office and allow 72 hours.    Due to Covid, you will need to wear a mask upon entering  the hospital. If you do not have a mask, a mask will be given to you at the Main Entrance upon arrival. For doctor visits, patients may have 1 support person age 18 or older with them. For treatment visits, patients can not have anyone with them due to social distancing guidelines and our immunocompromised population.      

## 2023-04-05 NOTE — Progress Notes (Signed)
Patient has been examined by Dr. Katragadda. Vital signs and labs have been reviewed by MD - ANC, Creatinine, LFTs, hemoglobin, and platelets are within treatment parameters per M.D. - pt may proceed with treatment.  Primary RN and pharmacy notified.  

## 2023-04-05 NOTE — Patient Instructions (Signed)
[facility] [place]  Discharge Instructions: Thank you for choosing [facility] to provide your oncology and hematology care.  If you have a lab appointment with the Cancer Center - please note that after April 8th, 2024, all labs will be drawn in the cancer center.  You do not have to check in or register with the main entrance as you have in the past but will complete your check-in in the cancer center.  Wear comfortable clothing and clothing appropriate for easy access to any Portacath or PICC line.   We strive to give you quality time with your provider. You may need to reschedule your appointment if you arrive late (15 or more minutes).  Arriving late affects you and other patients whose appointments are after yours.  Also, if you miss three or more appointments without notifying the office, you may be dismissed from the clinic at the provider's discretion.      For prescription refill requests, have your pharmacy contact our office and allow 72 hours for refills to be completed.    Today you received the following chemotherapy and/or immunotherapy agents Keytruda. Pembrolizumab Injection What is this medication? PEMBROLIZUMAB (PEM broe LIZ ue mab) treats some types of cancer. It works by helping your immune system slow or stop the spread of cancer cells. It is a monoclonal antibody. This medicine may be used for other purposes; ask your health care provider or pharmacist if you have questions. COMMON BRAND NAME(S): Keytruda What should I tell my care team before I take this medication? They need to know if you have any of these conditions: Allogeneic stem cell transplant (uses someone else's stem cells) Autoimmune diseases, such as Crohn disease, ulcerative colitis, lupus History of chest radiation Nervous system problems, such as Guillain-Barre syndrome, myasthenia gravis Organ transplant An unusual or allergic reaction to pembrolizumab, other medications,  foods, dyes, or preservatives Pregnant or trying to get pregnant Breast-feeding How should I use this medication? This medication is injected into a vein. It is given by your care team in a hospital or clinic setting. A special MedGuide will be given to you before each treatment. Be sure to read this information carefully each time. Talk to your care team about the use of this medication in children. While it may be prescribed for children as young as 6 months for selected conditions, precautions do apply. Overdosage: If you think you have taken too much of this medicine contact a poison control center or emergency room at once. NOTE: This medicine is only for you. Do not share this medicine with others. What if I miss a dose? Keep appointments for follow-up doses. It is important not to miss your dose. Call your care team if you are unable to keep an appointment. What may interact with this medication? Interactions have not been studied. This list may not describe all possible interactions. Give your health care provider a list of all the medicines, herbs, non-prescription drugs, or dietary supplements you use. Also tell them if you smoke, drink alcohol, or use illegal drugs. Some items may interact with your medicine. What should I watch for while using this medication? Your condition will be monitored carefully while you are receiving this medication. You may need blood work while taking this medication. This medication may cause serious skin reactions. They can happen weeks to months after starting the medication. Contact your care team right away if you notice fevers or flu-like symptoms with a rash. The rash   may be red or purple and then turn into blisters or peeling of the skin. You may also notice a red rash with swelling of the face, lips, or lymph nodes in your neck or under your arms. Tell your care team right away if you have any change in your eyesight. Talk to your care team if you  may be pregnant. Serious birth defects can occur if you take this medication during pregnancy and for 4 months after the last dose. You will need a negative pregnancy test before starting this medication. Contraception is recommended while taking this medication and for 4 months after the last dose. Your care team can help you find the option that works for you. Do not breastfeed while taking this medication and for 4 months after the last dose. What side effects may I notice from receiving this medication? Side effects that you should report to your care team as soon as possible: Allergic reactions--skin rash, itching, hives, swelling of the face, lips, tongue, or throat Dry cough, shortness of breath or trouble breathing Eye pain, redness, irritation, or discharge with blurry or decreased vision Heart muscle inflammation--unusual weakness or fatigue, shortness of breath, chest pain, fast or irregular heartbeat, dizziness, swelling of the ankles, feet, or hands Hormone gland problems--headache, sensitivity to light, unusual weakness or fatigue, dizziness, fast or irregular heartbeat, increased sensitivity to cold or heat, excessive sweating, constipation, hair loss, increased thirst or amount of urine, tremors or shaking, irritability Infusion reactions--chest pain, shortness of breath or trouble breathing, feeling faint or lightheaded Kidney injury (glomerulonephritis)--decrease in the amount of urine, red or dark brown urine, foamy or bubbly urine, swelling of the ankles, hands, or feet Liver injury--right upper belly pain, loss of appetite, nausea, light-colored stool, dark yellow or brown urine, yellowing skin or eyes, unusual weakness or fatigue Pain, tingling, or numbness in the hands or feet, muscle weakness, change in vision, confusion or trouble speaking, loss of balance or coordination, trouble walking, seizures Rash, fever, and swollen lymph nodes Redness, blistering, peeling, or loosening  of the skin, including inside the mouth Sudden or severe stomach pain, bloody diarrhea, fever, nausea, vomiting Side effects that usually do not require medical attention (report to your care team if they continue or are bothersome): Bone, joint, or muscle pain Diarrhea Fatigue Loss of appetite Nausea Skin rash This list may not describe all possible side effects. Call your doctor for medical advice about side effects. You may report side effects to FDA at 1-800-FDA-1088. Where should I keep my medication? This medication is given in a hospital or clinic. It will not be stored at home. NOTE: This sheet is a summary. It may not cover all possible information. If you have questions about this medicine, talk to your doctor, pharmacist, or health care provider.  2024 Elsevier/Gold Standard (2022-02-10 00:00:00)       To help prevent nausea and vomiting after your treatment, we encourage you to take your nausea medication as directed.  BELOW ARE SYMPTOMS THAT SHOULD BE REPORTED IMMEDIATELY: *FEVER GREATER THAN 100.4 F (38 C) OR HIGHER *CHILLS OR SWEATING *NAUSEA AND VOMITING THAT IS NOT CONTROLLED WITH YOUR NAUSEA MEDICATION *UNUSUAL SHORTNESS OF BREATH *UNUSUAL BRUISING OR BLEEDING *URINARY PROBLEMS (pain or burning when urinating, or frequent urination) *BOWEL PROBLEMS (unusual diarrhea, constipation, pain near the anus) TENDERNESS IN MOUTH AND THROAT WITH OR WITHOUT PRESENCE OF ULCERS (sore throat, sores in mouth, or a toothache) UNUSUAL RASH, SWELLING OR PAIN  UNUSUAL VAGINAL DISCHARGE OR ITCHING     Items with * indicate a potential emergency and should be followed up as soon as possible or go to the Emergency Department if any problems should occur.  Please show the CHEMOTHERAPY ALERT CARD or IMMUNOTHERAPY ALERT CARD at check-in to the Emergency Department and triage nurse.  Should you have questions after your visit or need to cancel or reschedule your appointment, please contact  [facility] [phone]  and follow the prompts.  Office hours are 8:00 a.m. to 4:30 p.m. Monday - Friday. Please note that voicemails left after 4:00 p.m. may not be returned until the following business day.  We are closed weekends and major holidays. You have access to a nurse at all times for urgent questions. Please call the main number to the clinic [phone] and follow the prompts.  For any non-urgent questions, you may also contact your provider using MyChart. We now offer e-Visits for anyone 18 and older to request care online for non-urgent symptoms. For details visit [email or web address].   Also download the MyChart app! Go to the app store, search "MyChart", open the app, select [facility], and log in with your MyChart username and password.   

## 2023-04-05 NOTE — Progress Notes (Signed)
Patient presents today for treatment and follow up visit with Dr. Ellin Saba. Vital signs and labs within parameters for today's treatment.   Message received from A. Dareen Piano RN / Dr. Theo Dills to proceed with treatment. Labs reviewed by MD.   Treatment given today per MD orders. Tolerated infusion without adverse affects. Vital signs stable. No complaints at this time. Discharged from clinic ambulatory in stable condition. Alert and oriented x 3. F/U with [facility] as scheduled.

## 2023-04-09 ENCOUNTER — Ambulatory Visit: Payer: Medicare Other | Admitting: Family Medicine

## 2023-04-21 ENCOUNTER — Ambulatory Visit (HOSPITAL_COMMUNITY)
Admission: RE | Admit: 2023-04-21 | Discharge: 2023-04-21 | Disposition: A | Discharge status: Home / Self Care | Payer: Medicare Other | Source: Ambulatory Visit | Attending: Hematology | Admitting: Hematology

## 2023-04-21 DIAGNOSIS — R590 Localized enlarged lymph nodes: Secondary | ICD-10-CM | POA: Diagnosis not present

## 2023-04-21 DIAGNOSIS — C4442 Squamous cell carcinoma of skin of scalp and neck: Secondary | ICD-10-CM | POA: Diagnosis not present

## 2023-04-21 DIAGNOSIS — K8020 Calculus of gallbladder without cholecystitis without obstruction: Secondary | ICD-10-CM | POA: Diagnosis not present

## 2023-04-21 DIAGNOSIS — J8410 Pulmonary fibrosis, unspecified: Secondary | ICD-10-CM | POA: Diagnosis not present

## 2023-04-21 DIAGNOSIS — I6522 Occlusion and stenosis of left carotid artery: Secondary | ICD-10-CM | POA: Diagnosis not present

## 2023-04-21 DIAGNOSIS — Z972 Presence of dental prosthetic device (complete) (partial): Secondary | ICD-10-CM | POA: Diagnosis not present

## 2023-04-21 DIAGNOSIS — C7800 Secondary malignant neoplasm of unspecified lung: Secondary | ICD-10-CM | POA: Diagnosis not present

## 2023-04-21 MED ORDER — IOHEXOL 300 MG/ML  SOLN
125.0000 mL | Freq: Once | INTRAMUSCULAR | Status: AC | PRN
  Administered 2023-04-21: 125 mL via INTRAVENOUS

## 2023-04-26 NOTE — Progress Notes (Signed)
[facility] [address] [place] [address]    Clinic Day:  04/27/2023  Referring physician: [name], Elige Radon, MD  Patient Care Team: [name], Elige Radon, MD as PCP - General (Family Medicine) [name], MD as PCP - Cardiology (Cardiology) Bjorn Pippin, MD as Attending Physician (Urology) [name], MD as Consulting Physician (Cardiology) [name], MD (Inactive) as Consulting Physician (Gastroenterology) [name], MD as Consulting Physician (Ophthalmology) [name], MD as Consulting Physician (Orthopedic Surgery) [name], [name], LCSW as [facility] Care Management (Licensed Clinical Social Worker) [name], MD as Medical Oncologist (Medical Oncology) [name], RN as Oncology Nurse Navigator (Medical Oncology)   ASSESSMENT & PLAN:   Assessment: 1.  Right supraclavicular lymphadenopathy and multiple lung nodules: - Patient noticed right neck mass since Christmas 2022.  10 to 15 pound weight loss since January 2023.  Denies any dysphagia or odynophagia. - CT neck (05/27/2022): Numerous lymph nodes in the right neck, right supraclavicular lymph node mass measures 5.7 x 3.7 cm.  18 mm posterior lymph node in the right lower neck.  Numerous lymph nodes in the right lower and posterior neck approximately 1 cm.  Many have internal necrosis consistent with metastatic disease.  20 mm lymph node just above the right clavicle.  Left level 2 node 12 mm. - CT CAP (05/28/2022): Several bilateral lung nodules, RUL nodule measuring 1.8 x 1.3 cm.  Small clusters of nodules in the medial left upper lobe.  Irregular nodular density along the left side of the mediastinum measuring 2.1 cm.  No metastatic disease in the abdomen or pelvis.  Liver is slightly nodular contour. - Lab work shows elevated white count since 2009, predominantly lymphocytosis. - Pathology (06/09/2022): Metastatic carcinoma positive for p40, p63  and CK5/6-patchy positivity with CK7.  Negative for TTF-1, Napsin A, PAX8, prostein, PSA, CK20 and CDX2.  - PET scan (06/11/2022): Bulky right supraclavicular nodal mass, smaller hypermetabolic right posterior triangle and jugular lymph nodes and single hypermetabolic left level 3 lymph node.  Bilateral hypermetabolic pulmonary nodules and metastatic pattern.  Minimal hypermetabolic mediastinal nodal metastasis.  Cluster of hypermetabolic right axillary lymph nodes.  No evidence of metastatic disease or primary lesion in the abdomen or pelvis.  No bone lesions. - MRI of the brain: Chronic small vessel ischemic changes with no evidence of metastatic disease. - NGS: T p53 pathogenic variant, MSI-stable, TMB-low, LOH-low, PD-L1 (GN562): Negative, p16 and p18 negative.  HER2 by IHC 0. - PD-L1 22 C3: CPS score 25 - Cancer type ID: 96% probability squamous cell carcinoma, subtype head and neck/skin.  Cancer types ruled out with 95% confidence includes skin basal cell carcinoma. - Carboplatin, Taxol and pembrolizumab 07/23/2022 through 11/09/22   2.  Social/family history: - He lives at home with his wife.  Independent of ADLs and IADLs.  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  May have had asbestos exposure 30 years ago.  Quit smoking cigarettes in 1965.  Smoked 1 pack/day for less than 12 years.  2 of his brothers had lung cancer and both were smokers.    Plan: 1.  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 - CT CAP (04/21/2023): Anterior mediastinal node measures 1.6 x 1.4 cm, previously 1.2 x 1.1  cm.  Right hilar node measures 3.2 x 3.0 cm, previously 3.0 x 2.2 cm.  No new lymphadenopathy.  Other lymph nodes are stable. - Even though findings are consistent with progression,  not entirely clear if these lymph nodes or from the squamous cell cancer or CLL.  He is not symptomatic from the progression and is tolerating Keytruda well.  We also discussed other treatment options including adding EGFR antibody cetuximab or single agent chemotherapy.  However he had severe fatigue with last chemotherapy.  Hence we have recommended continuing Keytruda at this time. - He reports feeling exhausted for 1 to 2 hours after eating breakfast every day.  Afternoon his energy levels improved.  He does not have any myasthenia symptoms. - Reports dry skin and itching.  Recommend moisturizing lotion twice daily. - Reviewed labs today: LFTs and creatinine and electrolytes normal.  Leukocytosis, lymphocytic stable. - Proceed with Keytruda today and in 3 weeks.  RTC 6 weeks for follow-up.    2.  Neuropathy: - Leg pains from neuropathy have been stable but improved since paclitaxel discontinued.  Not requiring any pain medicine.  3.  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  No clear B symptoms but has fatigue.  It is hard to differentiate the fatigue from CLL versus from his metastatic squamous cell carcinoma and immunotherapy.  No other significant cytopenias.    No orders of the defined types were placed in this encounter.     [name],acting as a Neurosurgeon for [name], MD.,have documented all relevant documentation on the behalf of [name], MD,as directed by  [name], MD while in the presence of [name], MD.  I, [name] MD, have reviewed the above documentation for accuracy and completeness, and I agree with the above.    [name], MD   7/16/20244:34 PM  CHIEF COMPLAINT:   Diagnosis: poorly differentiated squamous cell carcinoma    Cancer Staging  Squamous cell carcinoma of head and neck Staging form: Cervical Lymph  Nodes and Unknown Primary Tumors of the Head and Neck, AJCC 8th Edition - Clinical stage from 07/16/2022: Stage IVC (cT0, cN3b, cM1) - Unsigned    Prior Therapy: Carboplatin, paclitaxel and pembrolizumab, 07/23/22 - 11/09/22  Current Therapy:  pembrolizumab alone    HISTORY OF PRESENT ILLNESS:   Oncology History  Squamous cell carcinoma of head and neck  07/16/2022 Initial Diagnosis   Squamous cell carcinoma of head and neck (HCC)   07/23/2022 -  Chemotherapy   Patient is on Treatment Plan : Carboplatin  + Paclitaxel  + Pembrolizumab (200) D1 q21d        INTERVAL HISTORY:   [name] is a 89 y.o. male presenting to clinic today for follow up of poorly differentiated squamous cell carcinoma. He was last seen by me on 04/05/23.  He underwent a CT of the chest abdomen and pelvis w contrast on 7/10 that found; 1. Multiple enlarged mediastinal and right hilar lymph nodes, some of which are slightly enlarged compared to prior examination, consistent with worsened nodal metastatic disease. 2. Unchanged treated pulmonary nodules, consistent with stable pulmonary metastatic disease. 3. No evidence of lymphadenopathy or metastatic disease in the abdomen or pelvis. 4. Unchanged mild underlying pulmonary fibrosis in a pattern with apical to basal gradient, featuring irregular peripheral interstitial opacity and septal thickening without clear evidence of bronchiectasis or honeycombing. 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 5. 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 6. Hiatal hernia. 7. Cholelithiasis. 8. Sigmoid diverticulosis.  CT of the soft tissue neck w contrast on 7/10 found   Today, he states that he is doing well overall. His appetite level is at 100%. His energy level is at 25%.  PAST MEDICAL HISTORY:   Past Medical History: Past Medical History:  Diagnosis Date   Anemia    Atrial fibrillation (HCC)    Cataract     Cellulitis    In past   Complication of anesthesia    HARD TIME WAKING; CAUSES MY BP TO GO UP    Coronary artery disease    5 bypasses   DJD (degenerative joint disease)    Ejection fraction < 50%    Mildly reduced, 40% by echo   GERD (gastroesophageal reflux disease)    Hyperlipidemia    Hypertension    Persistent atrial fibrillation (HCC)    Prostate hypertrophy    on CT scan 09/2014   PUD (peptic ulcer disease)    RESOLVED    Skin cancer of eyelid    Resected    Surgical History: Past Surgical History:  Procedure Laterality Date   APPENDECTOMY     BYPASS GRAFT  2005   CATARACT EXTRACTION W/PHACO Left 07/22/2015   Procedure: CATARACT EXTRACTION PHACO AND INTRAOCULAR LENS PLACEMENT LEFT EYE CDE=8.14;  Surgeon: [name], MD;  Location: [facility];  Service: Ophthalmology;  Laterality: Left;   CATARACT EXTRACTION W/PHACO Right 08/18/2019   Procedure: CATARACT EXTRACTION PHACO AND INTRAOCULAR LENS PLACEMENT (IOC);  Surgeon: Fabio Pierce, MD;  Location: [facility];  Service: Ophthalmology;  Laterality: Right;  CDE: 9.74   CORONARY ARTERY BYPASS GRAFT  4/05   LIMA to LAD, SVG to PDA, SVG to ramus intermediate   EYE SURGERY  07/2015   EYELID CARCINOMA EXCISION     Skin cancer resected   Heart bypass  2005   LYMPH NODE BIOPSY Right 06/09/2022   Procedure: LYMPH NODE BIOPSY; supraclavicular;  Surgeon: Lucretia Roers, MD;  Location: [facility];  Service: General;  Laterality: Right;   PORTACATH PLACEMENT Left 06/09/2022   Procedure: INSERTION PORT-A-CATH;  Surgeon: Lucretia Roers, MD;  Location: [facility];  Service: General;  Laterality: Left;   TOTAL HIP ARTHROPLASTY     Right   TOTAL HIP ARTHROPLASTY Left 05/04/2018   Procedure: LEFT TOTAL HIP ARTHROPLASTY;  Surgeon: [name], MD;  Location: [facility];  Service: Orthopedics;  Laterality: Left;   TOTAL KNEE ARTHROPLASTY     Right    Social History: Social History   Socioeconomic History   Marital status: Married    Spouse  name: [name]   Number of children: 1   Years of education: 10   Highest education level: 10th grade  Occupational History   Occupation: retired  Tobacco Use   Smoking status: Former    Current packs/day: 0.00    Average packs/day: 2.0 packs/day for 5.0 years (10.0 ttl pk-yrs)    Types: Cigarettes    Start date: 12/23/1958    Quit date: 12/23/1963    Years since quitting: 59.3   Smokeless tobacco: Never  Vaping Use   Vaping status: Never Used  Substance and Sexual Activity   Alcohol use: No   Drug use: No   Sexual activity: Yes  Other Topics Concern   Not on file  Social History Narrative   Lives in a split level home.   Social Determinants of Health  Financial Resource Strain: Low Risk  (01/31/2023)   Overall Financial Resource Strain (CARDIA)    Difficulty of Paying Living Expenses: Not hard at all  Food Insecurity: No Food Insecurity (01/31/2023)   Hunger Vital Sign    Worried About Running Out of Food in the Last Year: Never true    Ran Out of Food in the Last Year: Never true  Transportation Needs: No Transportation Needs (01/31/2023)   PRAPARE - Administrator, Civil Service (Medical): No    Lack of Transportation (Non-Medical): No  Physical Activity: Inactive (01/31/2023)   Exercise Vital Sign    Days of Exercise per Week: 0 days    Minutes of Exercise per Session: 30 min  Stress: No Stress Concern Present (01/31/2023)   [employer] of Occupational Health - Occupational Stress Questionnaire    Feeling of Stress : Only a little  Social Connections: Socially Isolated (01/31/2023)   Social Connection and Isolation Panel [NHANES]    Frequency of Communication with Friends and Family: Once a week    Frequency of Social Gatherings with Friends and Family: Once a week    Attends Religious Services: Never    Database administrator or Organizations: No    Attends Banker Meetings: Never    Marital Status: Married  Catering manager Violence:  Not At Risk (01/06/2023)   Humiliation, Afraid, Rape, and Kick questionnaire    Fear of Current or Ex-Partner: No    Emotionally Abused: No    Physically Abused: No    Sexually Abused: No    Family History: Family History  Problem Relation Age of Onset   Stroke Mother    Stroke Father    Hypertension Father    Early death Brother        32 months old   Cancer Brother        lung   Stroke Brother        heat    Current Medications:  Current Outpatient Medications:    acetaminophen (TYLENOL) 500 MG tablet, Take 1,000 mg by mouth every 6 (six) hours as needed for moderate pain., Disp: , Rfl:    Cholecalciferol (VITAMIN D3) 5000 units CAPS, Take 5,000 Units by mouth once a week. , Disp: , Rfl:    diltiazem (CARDIZEM CD) 120 MG 24 hr capsule, TAKE ONE (1) CAPSULE EACH DAY, Disp: 90 capsule, Rfl: 3   ezetimibe (ZETIA) 10 MG tablet, Take 1 tablet (10 mg total) by mouth daily., Disp: 90 tablet, Rfl: 3   finasteride (PROSCAR) 5 MG tablet, TAKE ONE (1) TABLET EACH DAY, Disp: 90 tablet, Rfl: 3   fluticasone (FLONASE) 50 MCG/ACT nasal spray, Place 1 spray into both nostrils daily as needed for allergies or rhinitis., Disp: , Rfl:    furosemide (LASIX) 20 MG tablet, Take 1 tablet (20 mg total) by mouth daily., Disp: 90 tablet, Rfl: 3   lovastatin (MEVACOR) 40 MG tablet, Take 1 tablet (40 mg total) by mouth at bedtime., Disp: 90 tablet, Rfl: 3   metoprolol tartrate (LOPRESSOR) 100 MG tablet, TAKE ONE (1) TABLET BY MOUTH TWO (2) TIMES DAILY, Disp: 180 tablet, Rfl: 3   montelukast (SINGULAIR) 10 MG tablet, Take 1 tablet (10 mg total) by mouth at bedtime., Disp: 90 tablet, Rfl: 3   PEMBROLIZUMAB IV, Inject into the vein every 21 ( twenty-one) days., Disp: , Rfl:    warfarin (COUMADIN) 5 MG tablet, TAKE 1 TABLET ON SAT, SUN, TUES, & THURSTAKE 1/2 TABLET  ON MON, WED, & FRIDAY, Disp: 70 tablet, Rfl: 3 No current facility-administered medications for this visit.  Facility-Administered Medications  Ordered in Other Visits:    sodium chloride flush (NS) 0.9 % injection 10 mL, 10 mL, Intracatheter, PRN, [name], MD, 10 mL at 04/27/23 1543   Allergies: Allergies  Allergen Reactions   Penicillins Hives and Rash    Has patient had a PCN reaction causing immediate rash, facial/tongue/throat swelling, SOB or lightheadedness with hypotension: Yes Has patient had a PCN reaction causing severe rash involving mucus membranes or skin necrosis: Yes Has patient had a PCN reaction that required hospitalization: No Has patient had a PCN reaction occurring within the last 10 years: No If all of the above answers are "NO", then may proceed with Cephalosporin use.    Hct [Hydrochlorothiazide] Other (See Comments)    hyper   Statins Other (See Comments)    Myalgia, tolerates low dosages of lovastatin     REVIEW OF SYSTEMS:   Review of Systems  Constitutional:  Positive for fatigue. Negative for chills and fever.  HENT:   Negative for lump/mass, mouth sores, nosebleeds, sore throat and trouble swallowing.   Eyes:  Negative for eye problems.  Respiratory:  Positive for shortness of breath. Negative for cough.   Cardiovascular:  Negative for chest pain, leg swelling and palpitations.  Gastrointestinal:  Negative for abdominal pain, constipation, diarrhea, nausea and vomiting.  Genitourinary:  Negative for bladder incontinence, difficulty urinating, dysuria, frequency, hematuria and nocturia.   Musculoskeletal:  Negative for arthralgias, back pain, flank pain, myalgias and neck pain.  Skin:  Negative for itching and rash.  Neurological:  Positive for dizziness and numbness. Negative for headaches.  Hematological:  Does not bruise/bleed easily.  Psychiatric/Behavioral:  Negative for depression, sleep disturbance and suicidal ideas. The patient is not nervous/anxious.   All other systems reviewed and are negative.    VITALS:   Blood pressure 130/83, pulse 69, temperature 98 F (36.7  C), resp. rate 18, weight 186 lb 14.4 oz (84.8 kg), SpO2 97%.  Wt Readings from Last 3 Encounters:  04/27/23 186 lb 14.4 oz (84.8 kg)  04/05/23 184 lb 3.2 oz (83.6 kg)  03/15/23 187 lb (84.8 kg)    Body mass index is 29.71 kg/m.  Performance status (ECOG): 1 - Symptomatic but completely ambulatory  PHYSICAL EXAM:   Physical Exam Vitals and nursing note reviewed. Exam conducted with a chaperone present.  Constitutional:      Appearance: Normal appearance.  Cardiovascular:     Rate and Rhythm: Normal rate and regular rhythm.     Pulses: Normal pulses.     Heart sounds: Normal heart sounds.  Pulmonary:     Effort: Pulmonary effort is normal.     Breath sounds: Normal breath sounds.  Abdominal:     Palpations: Abdomen is soft. There is no hepatomegaly, splenomegaly or mass.     Tenderness: There is no abdominal tenderness.  Musculoskeletal:     Right lower leg: No edema.     Left lower leg: No edema.  Lymphadenopathy:     Cervical: No cervical adenopathy.     Right cervical: No superficial, deep or posterior cervical adenopathy.    Left cervical: No superficial, deep or posterior cervical adenopathy.     Upper Body:     Right upper body: No supraclavicular or axillary adenopathy.     Left upper body: No supraclavicular or axillary adenopathy.  Neurological:     General: No focal deficit  present.     Mental Status: He is alert and oriented to person, place, and time.  Psychiatric:        Mood and Affect: Mood normal.        Behavior: Behavior normal.     LABS:      Latest Ref Rng & Units 04/27/2023   12:05 PM 04/05/2023   12:00 PM 03/15/2023   11:25 AM  CBC  WBC 4.0 - 10.5 K/uL 12.9  13.6  12.4   Hemoglobin 13.0 - 17.0 g/dL 60.4  54.0  98.1   Hematocrit 39.0 - 52.0 % 38.9  40.7  39.6   Platelets 150 - 400 K/uL 194  203  179       Latest Ref Rng & Units 04/27/2023   12:05 PM 04/05/2023   12:00 PM 03/15/2023   11:25 AM  CMP  Glucose 70 - 99 mg/dL 84  90  91   BUN 8  - 23 mg/dL 20  22  19    Creatinine 0.61 - 1.24 mg/dL 1.91  4.78  2.95   Sodium 135 - 145 mmol/L 141  137  137   Potassium 3.5 - 5.1 mmol/L 4.3  4.2  4.1   Chloride 98 - 111 mmol/L 107  103  104   CO2 22 - 32 mmol/L 26  26  26    Calcium 8.9 - 10.3 mg/dL 9.1  9.0  8.8   Total Protein 6.5 - 8.1 g/dL 7.1  7.2  7.0   Total Bilirubin 0.3 - 1.2 mg/dL 0.6  0.8  0.9   Alkaline Phos 38 - 126 U/L 60  59  60   AST 15 - 41 U/L 16  17  16    ALT 0 - 44 U/L 14  15  14       Lab Results  Component Value Date   CEA1 5.4 (H) 06/03/2022   /  CEA  Date Value Ref Range Status  06/03/2022 5.4 (H) 0.0 - 4.7 ng/mL Final    Comment:    (NOTE)                             Nonsmokers          <3.9                             Smokers             <5.6 Roche [employer] Electrochemiluminescence Immunoassay (ECLIA) Values obtained with different assay methods or kits cannot be used interchangeably.  Results cannot be interpreted as absolute evidence of the presence or absence of malignant disease. Performed At: [facility] [address] [place], [place] [id number] Jolene Schimke MD QI:[id number]    Lab Results  Component Value Date   PSA1 11.3 (H) 02/16/2022   Lab Results  Component Value Date   CAN199 7 (H) 06/03/2022   No results found for: "CAN125"  No results found for: "TOTALPROTELP", "ALBUMINELP", "A1GS", "A2GS", "BETS", "BETA2SER", "GAMS", "MSPIKE", "SPEI" No results found for: "TIBC", "FERRITIN", "IRONPCTSAT" Lab Results  Component Value Date   LDH 164 06/03/2022     STUDIES:   CT SOFT TISSUE NECK W CONTRAST  Result Date: 04/27/2023 CLINICAL DATA:  Follow-up squamous cell carcinoma of the head neck. EXAM: CT NECK WITH CONTRAST TECHNIQUE: Multidetector CT imaging of the neck was performed using the standard protocol following the bolus administration of  intravenous contrast. RADIATION DOSE REDUCTION: This exam was performed according to the departmental dose-optimization  program which includes automated exposure control, adjustment of the mA and/or kV according to patient size and/or use of iterative reconstruction technique. CONTRAST:  OMNIPAQUE IOHEXOL 300 MG/ML  SOLN COMPARISON:  02/19/2023 FINDINGS: Pharynx and larynx: No evidence of mass or inflammation. Limited assessment of the gingival surfaces due to dentures. Salivary glands: No inflammation, mass, or stone. Thyroid: Normal. Lymph nodes: Enlarged lymph nodes in the bilateral lower jugular chain and right posterior triangle. Index node in the right level 3/4 neck on 3:69 measures 1 cm in diameter, non progressed. A lymph node in the left jugular chain on 3:59 measures 8 mm, decreased from 11 mm. No newly involved lymph nodes Vascular: Extensive atheromatous plaque. Advanced narrowing is seen at the proximal left ICA and proximal right subclavian arteries. Limited intracranial: Negative Visualized orbits: Partial coverage is negative Mastoids and visualized paranasal sinuses: Mucosal thickening in the bilateral paranasal sinuses without covered fluid level Skeleton: No acute or aggressive finding. Upper chest: Clear apical lungs IMPRESSION: Cervical adenopathy in the bilateral lower neck shows no significant change since 02/19/2023. Electronically Signed   By: Tiburcio Pea M.D.   On: 04/27/2023 08:35   CT CHEST ABDOMEN PELVIS W CONTRAST  Result Date: 04/22/2023 CLINICAL DATA:  Metastatic disease evaluation, squamous cell carcinoma of head and neck, pulmonary metastases, ongoing Keytruda * Tracking Code: BO * EXAM: CT CHEST, ABDOMEN, AND PELVIS WITH CONTRAST TECHNIQUE: Multidetector CT imaging of the chest, abdomen and pelvis was performed following the standard protocol during bolus administration of intravenous contrast. RADIATION DOSE REDUCTION: This exam was performed according to the departmental dose-optimization program which includes automated exposure control, adjustment of the mA and/or kV according to  patient size and/or use of iterative reconstruction technique. CONTRAST:  OMNIPAQUE IOHEXOL 300 MG/ML  SOLN COMPARISON:  CT chest, 02/19/2023, CT chest abdomen pelvis, 12/16/2022 FINDINGS: CT CHEST FINDINGS Cardiovascular: Left chest port catheter. Aortic atherosclerosis. Gross cardiomegaly. Unchanged thrombus in the tip of the left atrial appendage (series 2, image 31). Three-vessel coronary artery calcifications status post median sternotomy and CABG. No pericardial effusion. Mediastinum/Nodes: Multiple enlarged mediastinal and right hilar lymph nodes, some of which are slightly enlarged compared to prior examination, for example an anterior mediastinal node measuring 1.6 x 1.4 cm, previously 1.2 x 1.1 cm (series 2, image 22) and right hilar node measuring 3.2 x 3.0 cm, previously 3.0 x 2.2 cm (series 2, image 30). Moderate hiatal hernia with intrathoracic position of the gastric fundus. Thyroid gland, trachea, and esophagus demonstrate no significant findings. Lungs/Pleura: Unchanged treated pulmonary nodules, largest again in the peripheral right upper lobe measuring 1.5 x 0.7 cm (series 5, image 55). Unchanged mild underlying pulmonary fibrosis in a pattern with apical to basal gradient, featuring irregular peripheral interstitial opacity and septal thickening without clear evidence of bronchiectasis or honeycombing. No pleural effusion or pneumothorax. Musculoskeletal: No chest wall abnormality. No acute osseous findings. CT ABDOMEN PELVIS FINDINGS Hepatobiliary: No solid liver abnormality is seen. Coarse contour of the liver. Gallstones. No gallbladder wall thickening, or biliary dilatation. Pancreas: Unremarkable. No pancreatic ductal dilatation or surrounding inflammatory changes. Spleen: Normal in size without significant abnormality. Adrenals/Urinary Tract: Adrenal glands are unremarkable. Simple, benign bilateral renal cortical cysts, for which no further follow-up or characterization is required.  Kidneys are otherwise normal, without renal calculi, solid lesion, or hydronephrosis. Evaluation of the bladder and low pelvis is significantly limited by dense metallic streak artifact  from bilateral hip total arthroplasty. Within this limitation, thickening of the bladder wall, otherwise poorly assessed. Stomach/Bowel: Stomach is within normal limits. Appendix not clearly visualized and may be surgically absent. No evidence of bowel wall thickening, distention, or inflammatory changes. Sigmoid diverticulosis. Vascular/Lymphatic: Aortic atherosclerosis. No enlarged abdominal or pelvic lymph nodes. Reproductive: Evaluation of the prostate and low pelvis is significantly limited by dense metallic streak artifact from bilateral hip total arthroplasty. Within this limitation, probable prostatomegaly, otherwise poorly assessed. Other: No abdominal wall hernia or abnormality. No ascites. Musculoskeletal: No acute osseous findings. Dense metallic streak artifact from bilateral hip total arthroplasty. IMPRESSION: 1. Multiple enlarged mediastinal and right hilar lymph nodes, some of which are slightly enlarged compared to prior examination, consistent with worsened nodal metastatic disease. 2. Unchanged treated pulmonary nodules, consistent with stable pulmonary metastatic disease. 3. No evidence of lymphadenopathy or metastatic disease in the abdomen or pelvis. 4. Unchanged mild underlying pulmonary fibrosis in a pattern with apical to basal gradient, featuring irregular peripheral interstitial opacity and septal thickening without clear evidence of bronchiectasis or honeycombing. 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 5. 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 6. Hiatal hernia. 7. Cholelithiasis. 8. Sigmoid diverticulosis. Aortic Atherosclerosis (ICD10-I70.0). Electronically Signed   By: Jearld Lesch M.D.   On: 04/22/2023 07:30

## 2023-04-27 ENCOUNTER — Inpatient Hospital Stay: Payer: Medicare Other | Attending: Hematology

## 2023-04-27 ENCOUNTER — Encounter: Payer: Self-pay | Admitting: Hematology

## 2023-04-27 ENCOUNTER — Inpatient Hospital Stay: Payer: Medicare Other

## 2023-04-27 ENCOUNTER — Inpatient Hospital Stay (HOSPITAL_BASED_OUTPATIENT_CLINIC_OR_DEPARTMENT_OTHER): Payer: Medicare Other | Admitting: Hematology

## 2023-04-27 VITALS — BP 154/64 | HR 78 | Temp 97.6°F | Resp 18

## 2023-04-27 DIAGNOSIS — G629 Polyneuropathy, unspecified: Secondary | ICD-10-CM | POA: Insufficient documentation

## 2023-04-27 DIAGNOSIS — C4442 Squamous cell carcinoma of skin of scalp and neck: Secondary | ICD-10-CM

## 2023-04-27 DIAGNOSIS — Z95828 Presence of other vascular implants and grafts: Secondary | ICD-10-CM

## 2023-04-27 DIAGNOSIS — C801 Malignant (primary) neoplasm, unspecified: Secondary | ICD-10-CM | POA: Insufficient documentation

## 2023-04-27 DIAGNOSIS — D72820 Lymphocytosis (symptomatic): Secondary | ICD-10-CM | POA: Insufficient documentation

## 2023-04-27 DIAGNOSIS — Z87891 Personal history of nicotine dependence: Secondary | ICD-10-CM | POA: Diagnosis not present

## 2023-04-27 DIAGNOSIS — Z801 Family history of malignant neoplasm of trachea, bronchus and lung: Secondary | ICD-10-CM | POA: Diagnosis not present

## 2023-04-27 DIAGNOSIS — Z5112 Encounter for antineoplastic immunotherapy: Secondary | ICD-10-CM | POA: Diagnosis not present

## 2023-04-27 DIAGNOSIS — Z79620 Long term (current) use of immunosuppressive biologic: Secondary | ICD-10-CM | POA: Diagnosis not present

## 2023-04-27 DIAGNOSIS — R918 Other nonspecific abnormal finding of lung field: Secondary | ICD-10-CM | POA: Diagnosis not present

## 2023-04-27 LAB — COMPREHENSIVE METABOLIC PANEL
ALT: 14 U/L (ref 0–44)
AST: 16 U/L (ref 15–41)
Albumin: 3.8 g/dL (ref 3.5–5.0)
Alkaline Phosphatase: 60 U/L (ref 38–126)
Anion gap: 8 (ref 5–15)
BUN: 20 mg/dL (ref 8–23)
CO2: 26 mmol/L (ref 22–32)
Calcium: 9.1 mg/dL (ref 8.9–10.3)
Chloride: 107 mmol/L (ref 98–111)
Creatinine, Ser: 1.07 mg/dL (ref 0.61–1.24)
GFR, Estimated: >60 mL/min (ref >60)
Glucose, Bld: 84 mg/dL (ref 70–99)
Potassium: 4.3 mmol/L (ref 3.5–5.1)
Sodium: 141 mmol/L (ref 135–145)
Total Bilirubin: 0.6 mg/dL (ref 0.3–1.2)
Total Protein: 7.1 g/dL (ref 6.5–8.1)

## 2023-04-27 LAB — CBC WITH DIFFERENTIAL/PLATELET
Abs Immature Granulocytes: 0.05 10*3/uL (ref 0.00–0.07)
Basophils Absolute: 0.1 10*3/uL (ref 0.0–0.1)
Basophils Relative: 0 %
Eosinophils Absolute: 1 10*3/uL — ABNORMAL HIGH (ref 0.0–0.5)
Eosinophils Relative: 8 %
HCT: 38.9 % — ABNORMAL LOW (ref 39.0–52.0)
Hemoglobin: 12.8 g/dL — ABNORMAL LOW (ref 13.0–17.0)
Immature Granulocytes: 0 %
Lymphocytes Relative: 38 %
Lymphs Abs: 4.9 10*3/uL — ABNORMAL HIGH (ref 0.7–4.0)
MCH: 33.5 pg (ref 26.0–34.0)
MCHC: 32.9 g/dL (ref 30.0–36.0)
MCV: 101.8 fL — ABNORMAL HIGH (ref 80.0–100.0)
Monocytes Absolute: 0.8 10*3/uL (ref 0.1–1.0)
Monocytes Relative: 6 %
Neutro Abs: 6.1 10*3/uL (ref 1.7–7.7)
Neutrophils Relative %: 48 %
Platelets: 194 10*3/uL (ref 150–400)
RBC: 3.82 MIL/uL — ABNORMAL LOW (ref 4.22–5.81)
RDW: 13.1 % (ref 11.5–15.5)
WBC: 12.9 10*3/uL — ABNORMAL HIGH (ref 4.0–10.5)
nRBC: 0 % (ref 0.0–0.2)

## 2023-04-27 LAB — TSH: TSH: 2.378 u[IU]/mL (ref 0.350–4.500)

## 2023-04-27 LAB — MAGNESIUM: Magnesium: 2.2 mg/dL (ref 1.7–2.4)

## 2023-04-27 MED ORDER — SODIUM CHLORIDE 0.9% FLUSH
10.0000 mL | INTRAVENOUS | Status: DC | PRN
  Administered 2023-04-27: 10 mL

## 2023-04-27 MED ORDER — SODIUM CHLORIDE 0.9 % IV SOLN: Freq: Once | INTRAVENOUS | Status: AC

## 2023-04-27 MED ORDER — SODIUM CHLORIDE 0.9 % IV SOLN
200.0000 mg | Freq: Once | INTRAVENOUS | Status: AC
  Administered 2023-04-27: 200 mg via INTRAVENOUS
  Filled 2023-04-27: qty 8

## 2023-04-27 MED ORDER — HEPARIN SOD (PORK) LOCK FLUSH 100 UNIT/ML IV SOLN
500.0000 [IU] | Freq: Once | INTRAVENOUS | Status: AC | PRN
  Administered 2023-04-27: 500 [IU]

## 2023-04-27 NOTE — Progress Notes (Signed)
Patient presents today for chemotherapy/immunotherapy infusion of Keytruda. Patient is in satisfactory condition with no new complaints voiced.  Vital signs are stable.  Labs reviewed by Dr. Ellin Saba during the office visit and all labs are within treatment parameters.  We will proceed with treatment per MD orders.   Patient tolerated treatment well with no complaints voiced.  Patient left ambulatory in stable condition.  Vital signs stable at discharge.  Follow up as scheduled.

## 2023-04-27 NOTE — Patient Instructions (Addendum)
[facility] Discharge Instructions   You were seen and examined today by Dr. Ellin Saba.  He reviewed the results of your lab work which are normal/stable.   He reviewed the results of your CT scans. The CT of the neck was stable. The CT scan of the chest shows that the lymph nodes have gotten slightly bigger. The spots in the lungs are stable. He discussed continuing the same treatment with Keytruda versus adding back chemotherapy for another 3 rounds to shrink everything back down, then drop the chemo and continue with the [facility] (immunotherapy).   Dr. Kirtland Bouchard recommends we just continue with treatment with [facility] every 3 weeks.   We will proceed with your treatment today.   Return as scheduled.    Thank you for choosing  [facility] at [facility] [place] to provide your oncology and hematology care.  To afford each patient quality time with our provider, please arrive at least 15 minutes before your scheduled appointment time.   If you have a lab appointment with the Cancer Center please come in thru the Main Entrance and check in at the main information desk.  You need to re-schedule your appointment should you arrive 10 or more minutes late.  We strive to give you quality time with our providers, and arriving late affects you and other patients whose appointments are after yours.  Also, if you no show three or more times for appointments you may be dismissed from the clinic at the providers discretion.     Again, thank you for choosing [facility].  Our hope is that these requests will decrease the amount of time that you wait before being seen by our physicians.       _____________________________________________________________  Should you have questions after your visit to [facility], please contact our office at [phone] and follow the prompts.  Our office hours are 8:00 a.m. and 4:30 p.m. Monday -  Friday.  Please note that voicemails left after 4:00 p.m. may not be returned until the following business day.  We are closed weekends and major holidays.  You do have access to a nurse 24-7, just call the main number to the clinic [phone] and do not press any options, hold on the line and a nurse will answer the phone.    For prescription refill requests, have your pharmacy contact our office and allow 72 hours.    Due to Covid, you will need to wear a mask upon entering the hospital. If you do not have a mask, a mask will be given to you at the Main Entrance upon arrival. For doctor visits, patients may have 1 support person age 36 or older with them. For treatment visits, patients can not have anyone with them due to social distancing guidelines and our immunocompromised population.

## 2023-04-27 NOTE — Patient Instructions (Signed)
[facility]  Discharge Instructions: Thank you for choosing [facility] to provide your oncology and hematology care.  If you have a lab appointment with the Cancer Center - please note that after April 8th, 2024, all labs will be drawn in the cancer center.  You do not have to check in or register with the main entrance as you have in the past but will complete your check-in in the cancer center.  Wear comfortable clothing and clothing appropriate for easy access to any Portacath or PICC line.   We strive to give you quality time with your provider. You may need to reschedule your appointment if you arrive late (15 or more minutes).  Arriving late affects you and other patients whose appointments are after yours.  Also, if you miss three or more appointments without notifying the office, you may be dismissed from the clinic at the provider's discretion.      For prescription refill requests, have your pharmacy contact our office and allow 72 hours for refills to be completed.    Today you received the following chemotherapy and/or immunotherapy agents Keytruda. Pembrolizumab Injection What is this medication? PEMBROLIZUMAB (PEM broe LIZ ue mab) treats some types of cancer. It works by helping your immune system slow or stop the spread of cancer cells. It is a monoclonal antibody. This medicine may be used for other purposes; ask your health care provider or pharmacist if you have questions. COMMON BRAND NAME(S): Keytruda What should I tell my care team before I take this medication? They need to know if you have any of these conditions: Allogeneic stem cell transplant (uses someone else's stem cells) Autoimmune diseases, such as Crohn disease, ulcerative colitis, lupus History of chest radiation Nervous system problems, such as Guillain-Barre syndrome, myasthenia gravis Organ transplant An unusual or allergic reaction to pembrolizumab, other medications,  foods, dyes, or preservatives Pregnant or trying to get pregnant Breast-feeding How should I use this medication? This medication is injected into a vein. It is given by your care team in a hospital or clinic setting. A special MedGuide will be given to you before each treatment. Be sure to read this information carefully each time. Talk to your care team about the use of this medication in children. While it may be prescribed for children as young as 6 months for selected conditions, precautions do apply. Overdosage: If you think you have taken too much of this medicine contact a poison control center or emergency room at once. NOTE: This medicine is only for you. Do not share this medicine with others. What if I miss a dose? Keep appointments for follow-up doses. It is important not to miss your dose. Call your care team if you are unable to keep an appointment. What may interact with this medication? Interactions have not been studied. This list may not describe all possible interactions. Give your health care provider a list of all the medicines, herbs, non-prescription drugs, or dietary supplements you use. Also tell them if you smoke, drink alcohol, or use illegal drugs. Some items may interact with your medicine. What should I watch for while using this medication? Your condition will be monitored carefully while you are receiving this medication. You may need blood work while taking this medication. This medication may cause serious skin reactions. They can happen weeks to months after starting the medication. Contact your care team right away if you notice fevers or flu-like symptoms with a rash. The rash   may be red or purple and then turn into blisters or peeling of the skin. You may also notice a red rash with swelling of the face, lips, or lymph nodes in your neck or under your arms. Tell your care team right away if you have any change in your eyesight. Talk to your care team if you  may be pregnant. Serious birth defects can occur if you take this medication during pregnancy and for 4 months after the last dose. You will need a negative pregnancy test before starting this medication. Contraception is recommended while taking this medication and for 4 months after the last dose. Your care team can help you find the option that works for you. Do not breastfeed while taking this medication and for 4 months after the last dose. What side effects may I notice from receiving this medication? Side effects that you should report to your care team as soon as possible: Allergic reactions--skin rash, itching, hives, swelling of the face, lips, tongue, or throat Dry cough, shortness of breath or trouble breathing Eye pain, redness, irritation, or discharge with blurry or decreased vision Heart muscle inflammation--unusual weakness or fatigue, shortness of breath, chest pain, fast or irregular heartbeat, dizziness, swelling of the ankles, feet, or hands Hormone gland problems--headache, sensitivity to light, unusual weakness or fatigue, dizziness, fast or irregular heartbeat, increased sensitivity to cold or heat, excessive sweating, constipation, hair loss, increased thirst or amount of urine, tremors or shaking, irritability Infusion reactions--chest pain, shortness of breath or trouble breathing, feeling faint or lightheaded Kidney injury (glomerulonephritis)--decrease in the amount of urine, red or dark brown urine, foamy or bubbly urine, swelling of the ankles, hands, or feet Liver injury--right upper belly pain, loss of appetite, nausea, light-colored stool, dark yellow or brown urine, yellowing skin or eyes, unusual weakness or fatigue Pain, tingling, or numbness in the hands or feet, muscle weakness, change in vision, confusion or trouble speaking, loss of balance or coordination, trouble walking, seizures Rash, fever, and swollen lymph nodes Redness, blistering, peeling, or loosening  of the skin, including inside the mouth Sudden or severe stomach pain, bloody diarrhea, fever, nausea, vomiting Side effects that usually do not require medical attention (report to your care team if they continue or are bothersome): Bone, joint, or muscle pain Diarrhea Fatigue Loss of appetite Nausea Skin rash This list may not describe all possible side effects. Call your doctor for medical advice about side effects. You may report side effects to FDA at 1-800-FDA-1088. Where should I keep my medication? This medication is given in a hospital or clinic. It will not be stored at home. NOTE: This sheet is a summary. It may not cover all possible information. If you have questions about this medicine, talk to your doctor, pharmacist, or health care provider.  2024 Elsevier/Gold Standard (2022-02-10 00:00:00)       To help prevent nausea and vomiting after your treatment, we encourage you to take your nausea medication as directed.  BELOW ARE SYMPTOMS THAT SHOULD BE REPORTED IMMEDIATELY: *FEVER GREATER THAN 100.4 F (38 C) OR HIGHER *CHILLS OR SWEATING *NAUSEA AND VOMITING THAT IS NOT CONTROLLED WITH YOUR NAUSEA MEDICATION *UNUSUAL SHORTNESS OF BREATH *UNUSUAL BRUISING OR BLEEDING *URINARY PROBLEMS (pain or burning when urinating, or frequent urination) *BOWEL PROBLEMS (unusual diarrhea, constipation, pain near the anus) TENDERNESS IN MOUTH AND THROAT WITH OR WITHOUT PRESENCE OF ULCERS (sore throat, sores in mouth, or a toothache) UNUSUAL RASH, SWELLING OR PAIN  UNUSUAL VAGINAL DISCHARGE OR ITCHING     Items with * indicate a potential emergency and should be followed up as soon as possible or go to the Emergency Department if any problems should occur.  Please show the CHEMOTHERAPY ALERT CARD or IMMUNOTHERAPY ALERT CARD at check-in to the Emergency Department and triage nurse.  Should you have questions after your visit or need to cancel or reschedule your appointment, please contact  [facility] [phone]  and follow the prompts.  Office hours are 8:00 a.m. to 4:30 p.m. Monday - Friday. Please note that voicemails left after 4:00 p.m. may not be returned until the following business day.  We are closed weekends and major holidays. You have access to a nurse at all times for urgent questions. Please call the main number to the clinic [phone] and follow the prompts.  For any non-urgent questions, you may also contact your provider using MyChart. We now offer e-Visits for anyone 18 and older to request care online for non-urgent symptoms. For details visit [email or web address].   Also download the MyChart app! Go to the app store, search "MyChart", open the app, select [facility], and log in with your MyChart username and password.   

## 2023-04-29 LAB — T4: T4, Total: 7.1 ug/dL (ref 4.5–12.0)

## 2023-05-03 ENCOUNTER — Ambulatory Visit (INDEPENDENT_AMBULATORY_CARE_PROVIDER_SITE_OTHER): Payer: Medicare Other | Admitting: Family Medicine

## 2023-05-03 ENCOUNTER — Encounter: Payer: Self-pay | Admitting: Family Medicine

## 2023-05-03 VITALS — BP 120/72 | HR 61 | Temp 98.0°F | Ht 66.5 in | Wt 187.0 lb

## 2023-05-03 DIAGNOSIS — I4819 Other persistent atrial fibrillation: Secondary | ICD-10-CM | POA: Diagnosis not present

## 2023-05-03 DIAGNOSIS — Z7901 Long term (current) use of anticoagulants: Secondary | ICD-10-CM | POA: Diagnosis not present

## 2023-05-03 LAB — COAGUCHEK XS/INR WAIVED
INR: 2.8 — ABNORMAL HIGH (ref 0.9–1.1)
Prothrombin Time: 33.5 s

## 2023-05-03 NOTE — Progress Notes (Signed)
   Established Patient Office Visit  Subjective   Patient ID: [name], male    DOB: 1932-05-12  Age: 89 y.o. MRN: [id number]  Chief Complaint  Patient presents with   Atrial Fibrillation    Atrial Fibrillation Past medical history includes atrial fibrillation.   [name] is here for an INR check.   Current regimen: 2.5 mg (5 mg x 0.5) every Thu, Sat; 5 mg (5 mg x 1) all other days    Indication: a. fib Bleeding Signs/Symptoms:  denies Thromboembolic Signs/Symptoms:  denies   Missed Coumadin Doses: none  Medication Changes:  took extra tablet x 1 after last visit when INR was 1.7 Dietary Changes:  none Bacterial/Viral Infection:  denies     ROS As per HPI.    Objective:     BP 120/72   Pulse 61   Temp 98 F (36.7 C)   Ht 5' 6.5" (1.689 m)   Wt 187 lb (84.8 kg)   SpO2 96%   BMI 29.73 kg/m    Physical Exam Vitals and nursing note reviewed.  Constitutional:      General: He is not in acute distress.    Appearance: He is not ill-appearing, toxic-appearing or diaphoretic.  Cardiovascular:     Rate and Rhythm: Normal rate. Rhythm irregularly irregular.     Heart sounds: No murmur heard. Pulmonary:     Effort: Pulmonary effort is normal. No respiratory distress.     Breath sounds: Normal breath sounds. No wheezing.  Abdominal:     General: Bowel sounds are normal.     Palpations: Abdomen is soft.  Musculoskeletal:     Cervical back: Neck supple. No rigidity.     Right lower leg: No edema.     Left lower leg: No edema.  Skin:    General: Skin is warm and dry.  Neurological:     General: No focal deficit present.     Mental Status: He is alert and oriented to person, place, and time.  Psychiatric:        Mood and Affect: Mood normal.        Behavior: Behavior normal.      No results found for any visits on 05/03/23.    The ASCVD Risk score (Arnett DK, et al., 2019) failed to calculate for the following reasons:   The 2019 ASCVD risk score is  only valid for ages 34 to 43    Assessment & Plan:   [name] was seen today for atrial fibrillation.  Diagnoses and all orders for this visit:  Long term current use of anticoagulant therapy -     CoaguChek XS/INR Waived  Atrial fibrillation, persistent (HCC)   Description   Thursdays and Saturdays take 1/2 tablet, Take 1 tablet all other days.   INR today is  2.8 (goal is 2-3)    Recheck in 4-6 weeks         Return in about 6 weeks (around 06/14/2023) for INR check with PCP. Marland Kitchen  The patient indicates understanding of these issues and agrees with the plan.     Gabriel Earing, FNP

## 2023-05-13 ENCOUNTER — Telehealth: Payer: Self-pay | Admitting: Family Medicine

## 2023-05-13 ENCOUNTER — Other Ambulatory Visit: Payer: Self-pay | Admitting: Family Medicine

## 2023-05-13 MED ORDER — METOPROLOL TARTRATE 50 MG PO TABS: 50.0000 mg | ORAL_TABLET | Freq: Two times a day (BID) | ORAL | 3 refills | Status: DC

## 2023-05-13 NOTE — Telephone Encounter (Signed)
He should take the Lasix and the Cardizem and Proscar in the morning and he also takes the metoprolol twice daily I believe, so that should be morning and evening, unless he has not been taking it twice daily but on the prescription the metoprolol is twice daily.  That said he had the warfarin and the Mevacor are fine to take in the evening.  If he wanted to switch the Cardizem to the evening then that might help him not feel so weak in the morning but I also think changing his Lopressor to a lower dose will help him as well.

## 2023-05-13 NOTE — Telephone Encounter (Signed)
I sent a lower prescription of metoprolol 50 mg instead of 100 mg twice daily, we will see if that still keeps his heart rate under control without lowering it too much. Have him keep an eye on it and let us know

## 2023-05-13 NOTE — Telephone Encounter (Signed)
Daughter aware and verbalizes understanding. 

## 2023-05-13 NOTE — Progress Notes (Signed)
I sent a lower prescription of metoprolol 50 mg instead of 100 mg twice daily, we will see if that still keeps his heart rate under control without lowering it too much. Have him keep an eye on it and let us know

## 2023-05-13 NOTE — Telephone Encounter (Signed)
Lasix Cardizime Profcor   Takes all in the morning after eating breakfast and 30 mins later is in the recliner because he feels weak.    Zetia Wafrin Lopressor 100mg  daily  Mevacore 1/2 pill   Takes above pills before bedtime.   Please advise how he needs to take his medications.  She thinks he is taking too much in the morning since he feels weak shortly after.   Daughter already aware you want him to change his lopressor to 50mg  daily.

## 2023-05-18 ENCOUNTER — Inpatient Hospital Stay: Payer: Medicare Other | Attending: Hematology

## 2023-05-18 ENCOUNTER — Inpatient Hospital Stay: Payer: Medicare Other

## 2023-05-18 VITALS — BP 159/88 | HR 88 | Temp 97.8°F | Resp 18

## 2023-05-18 VITALS — BP 153/92 | HR 96 | Temp 97.2°F | Resp 20 | Wt 189.4 lb

## 2023-05-18 DIAGNOSIS — Z95828 Presence of other vascular implants and grafts: Secondary | ICD-10-CM

## 2023-05-18 DIAGNOSIS — Z5112 Encounter for antineoplastic immunotherapy: Secondary | ICD-10-CM | POA: Insufficient documentation

## 2023-05-18 DIAGNOSIS — R918 Other nonspecific abnormal finding of lung field: Secondary | ICD-10-CM | POA: Insufficient documentation

## 2023-05-18 DIAGNOSIS — C4442 Squamous cell carcinoma of skin of scalp and neck: Secondary | ICD-10-CM

## 2023-05-18 DIAGNOSIS — Z79620 Long term (current) use of immunosuppressive biologic: Secondary | ICD-10-CM | POA: Diagnosis not present

## 2023-05-18 LAB — COMPREHENSIVE METABOLIC PANEL
ALT: 12 U/L (ref 0–44)
AST: 16 U/L (ref 15–41)
Albumin: 3.8 g/dL (ref 3.5–5.0)
Alkaline Phosphatase: 62 U/L (ref 38–126)
Anion gap: 11 (ref 5–15)
BUN: 17 mg/dL (ref 8–23)
CO2: 26 mmol/L (ref 22–32)
Calcium: 9.2 mg/dL (ref 8.9–10.3)
Chloride: 102 mmol/L (ref 98–111)
Creatinine, Ser: 1.06 mg/dL (ref 0.61–1.24)
GFR, Estimated: >60 mL/min (ref >60)
Glucose, Bld: 148 mg/dL — ABNORMAL HIGH (ref 70–99)
Potassium: 3.7 mmol/L (ref 3.5–5.1)
Sodium: 139 mmol/L (ref 135–145)
Total Bilirubin: 0.7 mg/dL (ref 0.3–1.2)
Total Protein: 7.1 g/dL (ref 6.5–8.1)

## 2023-05-18 LAB — CBC WITH DIFFERENTIAL/PLATELET
Abs Immature Granulocytes: 0.05 10*3/uL (ref 0.00–0.07)
Basophils Absolute: 0.1 10*3/uL (ref 0.0–0.1)
Basophils Relative: 0 %
Eosinophils Absolute: 0.9 10*3/uL — ABNORMAL HIGH (ref 0.0–0.5)
Eosinophils Relative: 8 %
HCT: 40 % (ref 39.0–52.0)
Hemoglobin: 13 g/dL (ref 13.0–17.0)
Immature Granulocytes: 0 %
Lymphocytes Relative: 35 %
Lymphs Abs: 4.2 10*3/uL — ABNORMAL HIGH (ref 0.7–4.0)
MCH: 33.2 pg (ref 26.0–34.0)
MCHC: 32.5 g/dL (ref 30.0–36.0)
MCV: 102.3 fL — ABNORMAL HIGH (ref 80.0–100.0)
Monocytes Absolute: 0.6 10*3/uL (ref 0.1–1.0)
Monocytes Relative: 5 %
Neutro Abs: 6.1 10*3/uL (ref 1.7–7.7)
Neutrophils Relative %: 52 %
Platelets: 165 10*3/uL (ref 150–400)
RBC: 3.91 MIL/uL — ABNORMAL LOW (ref 4.22–5.81)
RDW: 13.1 % (ref 11.5–15.5)
WBC: 11.8 10*3/uL — ABNORMAL HIGH (ref 4.0–10.5)
nRBC: 0 % (ref 0.0–0.2)

## 2023-05-18 LAB — MAGNESIUM: Magnesium: 2.1 mg/dL (ref 1.7–2.4)

## 2023-05-18 MED ORDER — HEPARIN SOD (PORK) LOCK FLUSH 100 UNIT/ML IV SOLN
500.0000 [IU] | Freq: Once | INTRAVENOUS | Status: AC | PRN
  Administered 2023-05-18: 500 [IU]

## 2023-05-18 MED ORDER — SODIUM CHLORIDE 0.9 % IV SOLN
200.0000 mg | Freq: Once | INTRAVENOUS | Status: AC
  Administered 2023-05-18: 200 mg via INTRAVENOUS
  Filled 2023-05-18: qty 8

## 2023-05-18 MED ORDER — SODIUM CHLORIDE 0.9 % IV SOLN: Freq: Once | INTRAVENOUS | Status: AC

## 2023-05-18 MED ORDER — SODIUM CHLORIDE 0.9% FLUSH
10.0000 mL | Freq: Once | INTRAVENOUS | Status: AC
  Administered 2023-05-18: 10 mL via INTRAVENOUS

## 2023-05-18 MED ORDER — SODIUM CHLORIDE 0.9% FLUSH
10.0000 mL | INTRAVENOUS | Status: DC | PRN
  Administered 2023-05-18: 10 mL

## 2023-05-18 NOTE — Progress Notes (Signed)
Patients port flushed without difficulty.  Good blood return noted with no bruising or swelling noted at site.  Stable during access and blood draw.  Patient to remain accessed for treatment. 

## 2023-05-18 NOTE — Patient Instructions (Signed)

## 2023-05-18 NOTE — Progress Notes (Signed)
Patient tolerated chemotherapy with no complaints voiced.  Side effects with management reviewed with understanding verbalized.  Port site clean and dry with no bruising or swelling noted at site.  Good blood return noted before and after administration of chemotherapy.  Band aid applied.  Patient left in satisfactory condition with VSS and no s/s of distress noted.   

## 2023-06-08 ENCOUNTER — Inpatient Hospital Stay: Payer: Medicare Other

## 2023-06-08 ENCOUNTER — Other Ambulatory Visit: Payer: Self-pay | Admitting: *Deleted

## 2023-06-08 ENCOUNTER — Inpatient Hospital Stay: Payer: Medicare Other | Admitting: Hematology

## 2023-06-08 DIAGNOSIS — C4442 Squamous cell carcinoma of skin of scalp and neck: Secondary | ICD-10-CM | POA: Diagnosis not present

## 2023-06-08 DIAGNOSIS — Z95828 Presence of other vascular implants and grafts: Secondary | ICD-10-CM

## 2023-06-08 DIAGNOSIS — Z79899 Other long term (current) drug therapy: Secondary | ICD-10-CM | POA: Diagnosis not present

## 2023-06-08 DIAGNOSIS — Z79620 Long term (current) use of immunosuppressive biologic: Secondary | ICD-10-CM | POA: Diagnosis not present

## 2023-06-08 DIAGNOSIS — R918 Other nonspecific abnormal finding of lung field: Secondary | ICD-10-CM | POA: Diagnosis not present

## 2023-06-08 DIAGNOSIS — Z5112 Encounter for antineoplastic immunotherapy: Secondary | ICD-10-CM | POA: Diagnosis not present

## 2023-06-08 LAB — MAGNESIUM: Magnesium: 2.1 mg/dL (ref 1.7–2.4)

## 2023-06-08 LAB — COMPREHENSIVE METABOLIC PANEL
ALT: 15 U/L (ref 0–44)
AST: 18 U/L (ref 15–41)
Albumin: 3.7 g/dL (ref 3.5–5.0)
Alkaline Phosphatase: 55 U/L (ref 38–126)
Anion gap: 6 (ref 5–15)
BUN: 20 mg/dL (ref 8–23)
CO2: 27 mmol/L (ref 22–32)
Calcium: 8.7 mg/dL — ABNORMAL LOW (ref 8.9–10.3)
Chloride: 105 mmol/L (ref 98–111)
Creatinine, Ser: 1.05 mg/dL (ref 0.61–1.24)
GFR, Estimated: >60 mL/min (ref >60)
Glucose, Bld: 88 mg/dL (ref 70–99)
Potassium: 4 mmol/L (ref 3.5–5.1)
Sodium: 138 mmol/L (ref 135–145)
Total Bilirubin: 0.8 mg/dL (ref 0.3–1.2)
Total Protein: 6.9 g/dL (ref 6.5–8.1)

## 2023-06-08 LAB — TSH: TSH: 0.936 u[IU]/mL (ref 0.350–4.500)

## 2023-06-08 LAB — CBC WITH DIFFERENTIAL/PLATELET
Abs Immature Granulocytes: 0.05 10*3/uL (ref 0.00–0.07)
Basophils Absolute: 0.1 10*3/uL (ref 0.0–0.1)
Basophils Relative: 0 %
Eosinophils Absolute: 1 10*3/uL — ABNORMAL HIGH (ref 0.0–0.5)
Eosinophils Relative: 8 %
HCT: 39.1 % (ref 39.0–52.0)
Hemoglobin: 12.7 g/dL — ABNORMAL LOW (ref 13.0–17.0)
Immature Granulocytes: 0 %
Lymphocytes Relative: 38 %
Lymphs Abs: 4.8 10*3/uL — ABNORMAL HIGH (ref 0.7–4.0)
MCH: 33.5 pg (ref 26.0–34.0)
MCHC: 32.5 g/dL (ref 30.0–36.0)
MCV: 103.2 fL — ABNORMAL HIGH (ref 80.0–100.0)
Monocytes Absolute: 0.8 10*3/uL (ref 0.1–1.0)
Monocytes Relative: 6 %
Neutro Abs: 6.1 10*3/uL (ref 1.7–7.7)
Neutrophils Relative %: 48 %
Platelets: 175 10*3/uL (ref 150–400)
RBC: 3.79 MIL/uL — ABNORMAL LOW (ref 4.22–5.81)
RDW: 13 % (ref 11.5–15.5)
WBC: 12.7 10*3/uL — ABNORMAL HIGH (ref 4.0–10.5)
nRBC: 0 % (ref 0.0–0.2)

## 2023-06-08 MED ORDER — SODIUM CHLORIDE 0.9 % IV SOLN: Freq: Once | INTRAVENOUS | Status: AC

## 2023-06-08 MED ORDER — METHYLPHENIDATE HCL 5 MG PO TABS: 5.0000 mg | ORAL_TABLET | Freq: Every day | ORAL | 0 refills | Status: DC | PRN

## 2023-06-08 MED ORDER — SODIUM CHLORIDE 0.9% FLUSH
10.0000 mL | INTRAVENOUS | Status: DC | PRN
  Administered 2023-06-08: 10 mL

## 2023-06-08 MED ORDER — HEPARIN SOD (PORK) LOCK FLUSH 100 UNIT/ML IV SOLN
500.0000 [IU] | Freq: Once | INTRAVENOUS | Status: AC | PRN
  Administered 2023-06-08: 500 [IU]

## 2023-06-08 MED ORDER — SODIUM CHLORIDE 0.9 % IV SOLN
200.0000 mg | Freq: Once | INTRAVENOUS | Status: AC
  Administered 2023-06-08: 200 mg via INTRAVENOUS
  Filled 2023-06-08: qty 8

## 2023-06-08 MED ORDER — SODIUM CHLORIDE 0.9% FLUSH
10.0000 mL | INTRAVENOUS | Status: DC | PRN
  Administered 2023-06-08: 10 mL via INTRAVENOUS

## 2023-06-08 NOTE — Progress Notes (Signed)
[facility] [address], [place] [address]    Clinic Day:  06/08/23   Referring physician: [name], Elige Radon, MD  Patient Care Team: [name], Elige Radon, MD as PCP - General (Family Medicine) [name], MD as PCP - Cardiology (Cardiology) Bjorn Pippin, MD as Attending Physician (Urology) [name], MD as Consulting Physician (Cardiology) [name], MD (Inactive) as Consulting Physician (Gastroenterology) [name], MD as Consulting Physician (Ophthalmology) [name], MD as Consulting Physician (Orthopedic Surgery) [name], [name], LCSW as [facility] Care Management (Licensed Clinical Social Worker) [name], MD as Medical Oncologist (Medical Oncology) [name], RN as Oncology Nurse Navigator (Medical Oncology)   ASSESSMENT & PLAN:   Assessment: 1.  Right supraclavicular lymphadenopathy and multiple lung nodules: - Patient noticed right neck mass since Christmas 2022.  10 to 15 pound weight loss since January 2023.  Denies any dysphagia or odynophagia. - CT neck (05/27/2022): Numerous lymph nodes in the right neck, right supraclavicular lymph node mass measures 5.7 x 3.7 cm.  18 mm posterior lymph node in the right lower neck.  Numerous lymph nodes in the right lower and posterior neck approximately 1 cm.  Many have internal necrosis consistent with metastatic disease.  20 mm lymph node just above the right clavicle.  Left level 2 node 12 mm. - CT CAP (05/28/2022): Several bilateral lung nodules, RUL nodule measuring 1.8 x 1.3 cm.  Small clusters of nodules in the medial left upper lobe.  Irregular nodular density along the left side of the mediastinum measuring 2.1 cm.  No metastatic disease in the abdomen or pelvis.  Liver is slightly nodular contour. - Lab work shows elevated white count since 2009, predominantly lymphocytosis. - Pathology (06/09/2022): Metastatic carcinoma positive for p40, p63  and CK5/6-patchy positivity with CK7.  Negative for TTF-1, Napsin A, PAX8, prostein, PSA, CK20 and CDX2.  - PET scan (06/11/2022): Bulky right supraclavicular nodal mass, smaller hypermetabolic right posterior triangle and jugular lymph nodes and single hypermetabolic left level 3 lymph node.  Bilateral hypermetabolic pulmonary nodules and metastatic pattern.  Minimal hypermetabolic mediastinal nodal metastasis.  Cluster of hypermetabolic right axillary lymph nodes.  No evidence of metastatic disease or primary lesion in the abdomen or pelvis.  No bone lesions. - MRI of the brain: Chronic small vessel ischemic changes with no evidence of metastatic disease. - NGS: T p53 pathogenic variant, MSI-stable, TMB-low, LOH-low, PD-L1 ([id number]): Negative, p16 and p18 negative.  HER2 by IHC 0. - PD-L1 22 C3: CPS score 25 - Cancer type ID: 96% probability squamous cell carcinoma, subtype head and neck/skin.  Cancer types ruled out with 95% confidence includes skin basal cell carcinoma. - Carboplatin, Taxol and pembrolizumab 07/23/2022 through 11/09/22   2.  Social/family history: - He lives at home with his wife.  Independent of ADLs and IADLs.  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  May have had asbestos exposure 30 years ago.  Quit smoking cigarettes in 1965.  Smoked 1 pack/day for less than 12 years.  2 of his brothers had lung cancer and both were smokers.    Plan: 1.  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 - CT CAP (04/20/2021): Anterior mediastinal lymph node 1.6 x 1.4 cm previously 1.2 x 1.1  cm.  Right hilar lymph node 3.2 x 3 cm previously 3 x 2.2 cm.  No new adenopathy.  Other lymph nodes are stable. - Even though these findings are consistent with progression, not entirely  clear if these lymph nodes are from squamous cell cancer or CLL.  Since he is tolerating Keytruda very well, we have recommended continuation.  We have also discussed other treatment options including adding EGFR antibody cetuximab/single agent chemotherapy.  However he had severe tach from last chemotherapy. - He reports feeling exhausted for 1 to 2 hours after eating breakfast every day.  He reports his energy levels are not improving. - Recommend checking testosterone level, FSH, LH, TSH today. - Recommend Ritalin 5 mg daily as needed for severe fatigue. - We reviewed labs today: Normal LFTs.  CBC grossly normal.  Proceed with Keytruda today and in 3 weeks.  RTC 6 weeks for follow-up.  Will repeat CT CAP prior to next visit.    2.  Lymphocytic leukocytosis: - Previous flow cytometry in August 2023: Monoclonal B-cell population consistent with CLL.  No other significant cytopenias.     Orders Placed This Encounter  Procedures   CT CHEST ABDOMEN PELVIS W CONTRAST    Standing Status:   Future    Standing Expiration Date:   06/07/2024    Order Specific Question:   If indicated for the ordered procedure, I authorize the administration of contrast media per Radiology protocol    Answer:   Yes    Order Specific Question:   Does the patient have a contrast media/X-ray dye allergy?    Answer:   No    Order Specific Question:   Preferred imaging location?    Answer:   [facility]    Order Specific Question:   If indicated for the ordered procedure, I authorize the administration of oral contrast media per Radiology protocol    Answer:   Yes   Magnesium    Standing Status:   Future    Standing Expiration Date:   08/08/2024   CBC with Differential    Standing Status:   Future    Standing Expiration Date:   08/08/2024   Comprehensive metabolic panel    Standing Status:   Future    Standing Expiration Date:   08/08/2024   Magnesium    Standing Status:   Future    Standing Expiration  Date:   08/29/2024   CBC with Differential    Standing Status:   Future    Standing Expiration Date:   08/29/2024   Comprehensive metabolic panel    Standing Status:   Future    Standing Expiration Date:   08/29/2024   T4    Standing Status:   Future    Standing Expiration Date:   08/29/2024   TSH    Standing Status:   Future    Standing Expiration Date:   08/29/2024   Magnesium    Standing Status:   Future    Standing Expiration Date:   09/19/2024   CBC with Differential    Standing Status:   Future    Standing Expiration Date:   09/19/2024   Comprehensive metabolic panel    Standing Status:   Future    Standing Expiration Date:   09/19/2024   Magnesium    Standing Status:   Future    Standing Expiration Date:   10/10/2024   CBC with Differential    Standing Status:   Future  Standing Expiration Date:   10/10/2024   Comprehensive metabolic panel    Standing Status:   Future    Standing Expiration Date:   10/10/2024   Testosterone   Follicle stimulating hormone   Luteinizing hormone   TSH       I,Helena R Teague,acting as a scribe for [name], MD.,have documented all relevant documentation on the behalf of [name], MD,as directed by  [name], MD while in the presence of [name], MD.  I, [name] MD, have reviewed the above documentation for accuracy and completeness, and I agree with the above.     [name], MD   8/27/20245:14 PM  CHIEF COMPLAINT:   Diagnosis: poorly differentiated squamous cell carcinoma    Cancer Staging  Squamous cell carcinoma of head and neck Staging form: Cervical Lymph Nodes and Unknown Primary Tumors of the Head and Neck, AJCC 8th Edition - Clinical stage from 07/16/2022: Stage IVC (cT0, cN3b, cM1) - Unsigned    Prior Therapy: Carboplatin, paclitaxel and pembrolizumab, 07/23/22 - 11/09/22  Current Therapy:  pembrolizumab alone    HISTORY OF PRESENT ILLNESS:    Oncology History  Squamous cell carcinoma of head and neck  07/16/2022 Initial Diagnosis   Squamous cell carcinoma of head and neck (HCC)   07/23/2022 -  Chemotherapy   Patient is on Treatment Plan : Carboplatin  + Paclitaxel  + Pembrolizumab (200) D1 q21d        INTERVAL HISTORY:   [name] is a 89 y.o. male presenting to clinic today for follow up of poorly differentiated squamous cell carcinoma. He was last seen by me on 04/27/23.  Today, he states that he is doing well overall. His appetite level is at 100%. His energy level is at 0%. He is accompanied by his wife.   He notes he has a healthy appetite. He reports gradual worsening fatigue since his last visit. He notes he is tired after eating breakfast in the morning and after taking his medicine in the morning. His fatigue severely limits his daily activities and feel fatigued walking 80 yards or taking out the trash. He was more active when receiving chemotherapy than he currently is. He reports he has A-Fib, had multiple bypass surgeries, and currently takes Cardizem for this every morning.    PAST MEDICAL HISTORY:   Past Medical History: Past Medical History:  Diagnosis Date   Anemia    Atrial fibrillation (HCC)    Cataract    Cellulitis    In past   Complication of anesthesia    HARD TIME WAKING; CAUSES MY BP TO GO UP    Coronary artery disease    5 bypasses   DJD (degenerative joint disease)    Ejection fraction < 50%    Mildly reduced, 40% by echo   GERD (gastroesophageal reflux disease)    Hyperlipidemia    Hypertension    Persistent atrial fibrillation (HCC)    Prostate hypertrophy    on CT scan 09/2014   PUD (peptic ulcer disease)    RESOLVED    Skin cancer of eyelid    Resected    Surgical History: Past Surgical History:  Procedure Laterality Date   APPENDECTOMY     BYPASS GRAFT  2005   CATARACT EXTRACTION W/PHACO Left 07/22/2015   Procedure: CATARACT EXTRACTION PHACO AND INTRAOCULAR LENS PLACEMENT  LEFT EYE CDE=8.14;  Surgeon: [name], MD;  Location: [facility];  Service: Ophthalmology;  Laterality: Left;   CATARACT EXTRACTION W/PHACO Right  08/18/2019   Procedure: CATARACT EXTRACTION PHACO AND INTRAOCULAR LENS PLACEMENT (IOC);  Surgeon: Fabio Pierce, MD;  Location: [facility];  Service: Ophthalmology;  Laterality: Right;  CDE: 9.74   CORONARY ARTERY BYPASS GRAFT  4/05   LIMA to LAD, SVG to PDA, SVG to ramus intermediate   EYE SURGERY  07/2015   EYELID CARCINOMA EXCISION     Skin cancer resected   Heart bypass  2005   LYMPH NODE BIOPSY Right 06/09/2022   Procedure: LYMPH NODE BIOPSY; supraclavicular;  Surgeon: Lucretia Roers, MD;  Location: [facility];  Service: General;  Laterality: Right;   PORTACATH PLACEMENT Left 06/09/2022   Procedure: INSERTION PORT-A-CATH;  Surgeon: Lucretia Roers, MD;  Location: [facility];  Service: General;  Laterality: Left;   TOTAL HIP ARTHROPLASTY     Right   TOTAL HIP ARTHROPLASTY Left 05/04/2018   Procedure: LEFT TOTAL HIP ARTHROPLASTY;  Surgeon: [name], MD;  Location: [facility];  Service: Orthopedics;  Laterality: Left;   TOTAL KNEE ARTHROPLASTY     Right    Social History: Social History   Socioeconomic History   Marital status: Married    Spouse name: Talbert Forest   Number of children: 1   Years of education: 10   Highest education level: 10th grade  Occupational History   Occupation: retired  Tobacco Use   Smoking status: Former    Current packs/day: 0.00    Average packs/day: 2.0 packs/day for 5.0 years (10.0 ttl pk-yrs)    Types: Cigarettes    Start date: 12/23/1958    Quit date: 12/23/1963    Years since quitting: 59.4   Smokeless tobacco: Never  Vaping Use   Vaping status: Never Used  Substance and Sexual Activity   Alcohol use: No   Drug use: No   Sexual activity: Yes  Other Topics Concern   Not on file  Social History Narrative   Lives in a split level home.   Social Determinants of Health   Financial Resource Strain: Low  Risk  (01/31/2023)   Overall Financial Resource Strain (CARDIA)    Difficulty of Paying Living Expenses: Not hard at all  Food Insecurity: No Food Insecurity (01/31/2023)   Hunger Vital Sign    Worried About Running Out of Food in the Last Year: Never true    Ran Out of Food in the Last Year: Never true  Transportation Needs: No Transportation Needs (01/31/2023)   PRAPARE - Administrator, Civil Service (Medical): No    Lack of Transportation (Non-Medical): No  Physical Activity: Inactive (01/31/2023)   Exercise Vital Sign    Days of Exercise per Week: 0 days    Minutes of Exercise per Session: 30 min  Stress: No Stress Concern Present (01/31/2023)   [employer] of Occupational Health - Occupational Stress Questionnaire    Feeling of Stress : Only a little  Social Connections: Socially Isolated (01/31/2023)   Social Connection and Isolation Panel [NHANES]    Frequency of Communication with Friends and Family: Once a week    Frequency of Social Gatherings with Friends and Family: Once a week    Attends Religious Services: Never    Database administrator or Organizations: No    Attends Banker Meetings: Never    Marital Status: Married  Catering manager Violence: Not At Risk (01/06/2023)   Humiliation, Afraid, Rape, and Kick questionnaire    Fear of Current or Ex-Partner: No    Emotionally Abused: No  Physically Abused: No    Sexually Abused: No    Family History: Family History  Problem Relation Age of Onset   Stroke Mother    Stroke Father    Hypertension Father    Early death Brother        45 months old   Cancer Brother        lung   Stroke Brother        heat    Current Medications:  Current Outpatient Medications:    acetaminophen (TYLENOL) 500 MG tablet, Take 1,000 mg by mouth every 6 (six) hours as needed for moderate pain., Disp: , Rfl:    Cholecalciferol (VITAMIN D3) 5000 units CAPS, Take 5,000 Units by mouth once a week. , Disp: ,  Rfl:    diltiazem (CARDIZEM CD) 120 MG 24 hr capsule, TAKE ONE (1) CAPSULE EACH DAY, Disp: 90 capsule, Rfl: 3   ezetimibe (ZETIA) 10 MG tablet, Take 1 tablet (10 mg total) by mouth daily., Disp: 90 tablet, Rfl: 3   finasteride (PROSCAR) 5 MG tablet, TAKE ONE (1) TABLET EACH DAY, Disp: 90 tablet, Rfl: 3   fluticasone (FLONASE) 50 MCG/ACT nasal spray, Place 1 spray into both nostrils daily as needed for allergies or rhinitis., Disp: , Rfl:    furosemide (LASIX) 20 MG tablet, Take 1 tablet (20 mg total) by mouth daily., Disp: 90 tablet, Rfl: 3   lovastatin (MEVACOR) 40 MG tablet, Take 1 tablet (40 mg total) by mouth at bedtime., Disp: 90 tablet, Rfl: 3   metoprolol tartrate (LOPRESSOR) 50 MG tablet, Take 1 tablet (50 mg total) by mouth 2 (two) times daily., Disp: 180 tablet, Rfl: 3   montelukast (SINGULAIR) 10 MG tablet, Take 1 tablet (10 mg total) by mouth at bedtime., Disp: 90 tablet, Rfl: 3   PEMBROLIZUMAB IV, Inject into the vein every 21 ( twenty-one) days., Disp: , Rfl:    warfarin (COUMADIN) 5 MG tablet, TAKE 1 TABLET ON SAT, SUN, TUES, & THURSTAKE 1/2 TABLET ON MON, WED, & FRIDAY, Disp: 70 tablet, Rfl: 3 No current facility-administered medications for this visit.  Facility-Administered Medications Ordered in Other Visits:    sodium chloride flush (NS) 0.9 % injection 10 mL, 10 mL, Intracatheter, PRN, [name], MD, 10 mL at 06/08/23 1605   Allergies: Allergies  Allergen Reactions   Penicillins Hives and Rash    Has patient had a PCN reaction causing immediate rash, facial/tongue/throat swelling, SOB or lightheadedness with hypotension: Yes Has patient had a PCN reaction causing severe rash involving mucus membranes or skin necrosis: Yes Has patient had a PCN reaction that required hospitalization: No Has patient had a PCN reaction occurring within the last 10 years: No If all of the above answers are "NO", then may proceed with Cephalosporin use.    Hct  [Hydrochlorothiazide] Other (See Comments)    hyper   Statins Other (See Comments)    Myalgia, tolerates low dosages of lovastatin     REVIEW OF SYSTEMS:   Review of Systems  Constitutional:  Positive for fatigue. Negative for chills and fever.  HENT:   Negative for lump/mass, mouth sores, nosebleeds, sore throat and trouble swallowing.        +sinuitis  Eyes:  Negative for eye problems.  Respiratory:  Positive for shortness of breath (with exertion in heat). Negative for cough.   Cardiovascular:  Positive for chest pain (mostly at night). Negative for leg swelling and palpitations.  Gastrointestinal:  Negative for abdominal pain, constipation, diarrhea, nausea  and vomiting.  Genitourinary:  Positive for dysuria. Negative for bladder incontinence, difficulty urinating, frequency, hematuria and nocturia.        +dark urine  Musculoskeletal:  Negative for arthralgias, back pain, flank pain, myalgias and neck pain.  Skin:  Negative for itching and rash.  Neurological:  Positive for dizziness, headaches and numbness (in fingertips and hands).  Hematological:  Does not bruise/bleed easily.  Psychiatric/Behavioral:  Negative for depression, sleep disturbance and suicidal ideas. The patient is not nervous/anxious.   All other systems reviewed and are negative.    VITALS:   There were no vitals taken for this visit.  Wt Readings from Last 3 Encounters:  06/08/23 187 lb 6.4 oz (85 kg)  05/18/23 189 lb 6.4 oz (85.9 kg)  05/03/23 187 lb (84.8 kg)    There is no height or weight on file to calculate BMI.  Performance status (ECOG): 1 - Symptomatic but completely ambulatory  PHYSICAL EXAM:   Physical Exam Vitals and nursing note reviewed. Exam conducted with a chaperone present.  Constitutional:      Appearance: Normal appearance.  Cardiovascular:     Rate and Rhythm: Normal rate and regular rhythm.     Pulses: Normal pulses.     Heart sounds: Normal heart sounds.  Pulmonary:      Effort: Pulmonary effort is normal.     Breath sounds: Normal breath sounds.  Abdominal:     Palpations: Abdomen is soft. There is no hepatomegaly, splenomegaly or mass.     Tenderness: There is no abdominal tenderness.  Musculoskeletal:     Right lower leg: No edema.     Left lower leg: No edema.  Lymphadenopathy:     Cervical: No cervical adenopathy.     Right cervical: No superficial, deep or posterior cervical adenopathy.    Left cervical: No superficial, deep or posterior cervical adenopathy.     Upper Body:     Right upper body: No supraclavicular or axillary adenopathy.     Left upper body: No supraclavicular or axillary adenopathy.  Neurological:     General: No focal deficit present.     Mental Status: He is alert and oriented to person, place, and time.  Psychiatric:        Mood and Affect: Mood normal.        Behavior: Behavior normal.     LABS:      Latest Ref Rng & Units 06/08/2023   12:15 PM 05/18/2023    1:06 PM 04/27/2023   12:05 PM  CBC  WBC 4.0 - 10.5 K/uL 12.7  11.8  12.9   Hemoglobin 13.0 - 17.0 g/dL 82.9  56.2  13.0   Hematocrit 39.0 - 52.0 % 39.1  40.0  38.9   Platelets 150 - 400 K/uL 175  165  194       Latest Ref Rng & Units 06/08/2023   12:15 PM 05/18/2023    1:30 PM 04/27/2023   12:05 PM  CMP  Glucose 70 - 99 mg/dL 88  865  84   BUN 8 - 23 mg/dL 20  17  20    Creatinine 0.61 - 1.24 mg/dL 7.84  6.96  2.95   Sodium 135 - 145 mmol/L 138  139  141   Potassium 3.5 - 5.1 mmol/L 4.0  3.7  4.3   Chloride 98 - 111 mmol/L 105  102  107   CO2 22 - 32 mmol/L 27  26  26    Calcium 8.9 -  10.3 mg/dL 8.7  9.2  9.1   Total Protein 6.5 - 8.1 g/dL 6.9  7.1  7.1   Total Bilirubin 0.3 - 1.2 mg/dL 0.8  0.7  0.6   Alkaline Phos 38 - 126 U/L 55  62  60   AST 15 - 41 U/L 18  16  16    ALT 0 - 44 U/L 15  12  14       Lab Results  Component Value Date   CEA1 5.4 (H) 06/03/2022   /  CEA  Date Value Ref Range Status  06/03/2022 5.4 (H) 0.0 - 4.7 ng/mL Final     Comment:    (NOTE)                             Nonsmokers          <3.9                             Smokers             <5.6 Roche [employer] Electrochemiluminescence Immunoassay (ECLIA) Values obtained with different assay methods or kits cannot be used interchangeably.  Results cannot be interpreted as absolute evidence of the presence or absence of malignant disease. Performed At: [facility] [address] [place], [place] [id number] Jolene Schimke MD [id number]:[id number]    Lab Results  Component Value Date   PSA1 11.3 (H) 02/16/2022   Lab Results  Component Value Date   CAN199 55 (H) 06/03/2022   No results found for: "CAN125"  No results found for: "TOTALPROTELP", "ALBUMINELP", "A1GS", "A2GS", "BETS", "BETA2SER", "GAMS", "MSPIKE", "SPEI" No results found for: "TIBC", "FERRITIN", "IRONPCTSAT" Lab Results  Component Value Date   LDH 164 06/03/2022     STUDIES:   No results found.

## 2023-06-08 NOTE — Patient Instructions (Signed)
[facility]  Discharge Instructions: Thank you for choosing [facility] to provide your oncology and hematology care.  If you have a lab appointment with the Cancer Center - please note that after April 8th, 2024, all labs will be drawn in the cancer center.  You do not have to check in or register with the main entrance as you have in the past but will complete your check-in in the cancer center.  Wear comfortable clothing and clothing appropriate for easy access to any Portacath or PICC line.   We strive to give you quality time with your provider. You may need to reschedule your appointment if you arrive late (15 or more minutes).  Arriving late affects you and other patients whose appointments are after yours.  Also, if you miss three or more appointments without notifying the office, you may be dismissed from the clinic at the provider's discretion.      For prescription refill requests, have your pharmacy contact our office and allow 72 hours for refills to be completed.    Today you received the following chemotherapy and/or immunotherapy agents Keytruda. Pembrolizumab Injection What is this medication? PEMBROLIZUMAB (PEM broe LIZ ue mab) treats some types of cancer. It works by helping your immune system slow or stop the spread of cancer cells. It is a monoclonal antibody. This medicine may be used for other purposes; ask your health care provider or pharmacist if you have questions. COMMON BRAND NAME(S): Keytruda What should I tell my care team before I take this medication? They need to know if you have any of these conditions: Allogeneic stem cell transplant (uses someone else's stem cells) Autoimmune diseases, such as Crohn disease, ulcerative colitis, lupus History of chest radiation Nervous system problems, such as Guillain-Barre syndrome, myasthenia gravis Organ transplant An unusual or allergic reaction to pembrolizumab, other medications,  foods, dyes, or preservatives Pregnant or trying to get pregnant Breast-feeding How should I use this medication? This medication is injected into a vein. It is given by your care team in a hospital or clinic setting. A special MedGuide will be given to you before each treatment. Be sure to read this information carefully each time. Talk to your care team about the use of this medication in children. While it may be prescribed for children as young as 6 months for selected conditions, precautions do apply. Overdosage: If you think you have taken too much of this medicine contact a poison control center or emergency room at once. NOTE: This medicine is only for you. Do not share this medicine with others. What if I miss a dose? Keep appointments for follow-up doses. It is important not to miss your dose. Call your care team if you are unable to keep an appointment. What may interact with this medication? Interactions have not been studied. This list may not describe all possible interactions. Give your health care provider a list of all the medicines, herbs, non-prescription drugs, or dietary supplements you use. Also tell them if you smoke, drink alcohol, or use illegal drugs. Some items may interact with your medicine. What should I watch for while using this medication? Your condition will be monitored carefully while you are receiving this medication. You may need blood work while taking this medication. This medication may cause serious skin reactions. They can happen weeks to months after starting the medication. Contact your care team right away if you notice fevers or flu-like symptoms with a rash. The rash   may be red or purple and then turn into blisters or peeling of the skin. You may also notice a red rash with swelling of the face, lips, or lymph nodes in your neck or under your arms. Tell your care team right away if you have any change in your eyesight. Talk to your care team if you  may be pregnant. Serious birth defects can occur if you take this medication during pregnancy and for 4 months after the last dose. You will need a negative pregnancy test before starting this medication. Contraception is recommended while taking this medication and for 4 months after the last dose. Your care team can help you find the option that works for you. Do not breastfeed while taking this medication and for 4 months after the last dose. What side effects may I notice from receiving this medication? Side effects that you should report to your care team as soon as possible: Allergic reactions--skin rash, itching, hives, swelling of the face, lips, tongue, or throat Dry cough, shortness of breath or trouble breathing Eye pain, redness, irritation, or discharge with blurry or decreased vision Heart muscle inflammation--unusual weakness or fatigue, shortness of breath, chest pain, fast or irregular heartbeat, dizziness, swelling of the ankles, feet, or hands Hormone gland problems--headache, sensitivity to light, unusual weakness or fatigue, dizziness, fast or irregular heartbeat, increased sensitivity to cold or heat, excessive sweating, constipation, hair loss, increased thirst or amount of urine, tremors or shaking, irritability Infusion reactions--chest pain, shortness of breath or trouble breathing, feeling faint or lightheaded Kidney injury (glomerulonephritis)--decrease in the amount of urine, red or dark brown urine, foamy or bubbly urine, swelling of the ankles, hands, or feet Liver injury--right upper belly pain, loss of appetite, nausea, light-colored stool, dark yellow or brown urine, yellowing skin or eyes, unusual weakness or fatigue Pain, tingling, or numbness in the hands or feet, muscle weakness, change in vision, confusion or trouble speaking, loss of balance or coordination, trouble walking, seizures Rash, fever, and swollen lymph nodes Redness, blistering, peeling, or loosening  of the skin, including inside the mouth Sudden or severe stomach pain, bloody diarrhea, fever, nausea, vomiting Side effects that usually do not require medical attention (report to your care team if they continue or are bothersome): Bone, joint, or muscle pain Diarrhea Fatigue Loss of appetite Nausea Skin rash This list may not describe all possible side effects. Call your doctor for medical advice about side effects. You may report side effects to FDA at 1-800-FDA-1088. Where should I keep my medication? This medication is given in a hospital or clinic. It will not be stored at home. NOTE: This sheet is a summary. It may not cover all possible information. If you have questions about this medicine, talk to your doctor, pharmacist, or health care provider.  2024 Elsevier/Gold Standard (2022-02-10 00:00:00)       To help prevent nausea and vomiting after your treatment, we encourage you to take your nausea medication as directed.  BELOW ARE SYMPTOMS THAT SHOULD BE REPORTED IMMEDIATELY: *FEVER GREATER THAN 100.4 F (38 C) OR HIGHER *CHILLS OR SWEATING *NAUSEA AND VOMITING THAT IS NOT CONTROLLED WITH YOUR NAUSEA MEDICATION *UNUSUAL SHORTNESS OF BREATH *UNUSUAL BRUISING OR BLEEDING *URINARY PROBLEMS (pain or burning when urinating, or frequent urination) *BOWEL PROBLEMS (unusual diarrhea, constipation, pain near the anus) TENDERNESS IN MOUTH AND THROAT WITH OR WITHOUT PRESENCE OF ULCERS (sore throat, sores in mouth, or a toothache) UNUSUAL RASH, SWELLING OR PAIN  UNUSUAL VAGINAL DISCHARGE OR ITCHING     Items with * indicate a potential emergency and should be followed up as soon as possible or go to the Emergency Department if any problems should occur.  Please show the CHEMOTHERAPY ALERT CARD or IMMUNOTHERAPY ALERT CARD at check-in to the Emergency Department and triage nurse.  Should you have questions after your visit or need to cancel or reschedule your appointment, please contact  [facility] [phone]  and follow the prompts.  Office hours are 8:00 a.m. to 4:30 p.m. Monday - Friday. Please note that voicemails left after 4:00 p.m. may not be returned until the following business day.  We are closed weekends and major holidays. You have access to a nurse at all times for urgent questions. Please call the main number to the clinic [phone] and follow the prompts.  For any non-urgent questions, you may also contact your provider using MyChart. We now offer e-Visits for anyone 18 and older to request care online for non-urgent symptoms. For details visit [email or web address].   Also download the MyChart app! Go to the app store, search "MyChart", open the app, select [facility], and log in with your MyChart username and password.   

## 2023-06-08 NOTE — Progress Notes (Signed)
Patient has been examined by Dr. Katragadda. Vital signs and labs have been reviewed by MD - ANC, Creatinine, LFTs, hemoglobin, and platelets are within treatment parameters per M.D. - pt may proceed with treatment.  Primary RN and pharmacy notified.  

## 2023-06-08 NOTE — Patient Instructions (Signed)

## 2023-06-08 NOTE — Progress Notes (Signed)
Patients port flushed without difficulty.  Good blood return noted with no bruising or swelling noted at site.  Patient remains accessed for treatment.  

## 2023-06-08 NOTE — Progress Notes (Signed)
Patient presents today for chemotherapy/immunotherapy infusion of Keytruda. Patient is in satisfactory condition with no new complaints voiced.  Vital signs are stable.  Labs reviewed by Dr. Ellin Saba during the office visit and all labs are within treatment parameters.  We will proceed with treatment per MD orders.   Patient tolerated treatment well with no complaints voiced.  Patient left ambulatory in stable condition.  Vital signs stable at discharge.  Follow up as scheduled.

## 2023-06-09 LAB — LUTEINIZING HORMONE: LH: 14.6 m[IU]/mL — ABNORMAL HIGH (ref 1.7–8.6)

## 2023-06-09 LAB — TESTOSTERONE: Testosterone: 294 ng/dL (ref 264–916)

## 2023-06-09 LAB — FOLLICLE STIMULATING HORMONE: FSH: 23.9 m[IU]/mL — ABNORMAL HIGH (ref 1.5–12.4)

## 2023-06-24 ENCOUNTER — Ambulatory Visit (INDEPENDENT_AMBULATORY_CARE_PROVIDER_SITE_OTHER): Payer: Medicare Other | Admitting: Family Medicine

## 2023-06-24 ENCOUNTER — Encounter: Payer: Self-pay | Admitting: Family Medicine

## 2023-06-24 VITALS — BP 142/70 | HR 85 | Ht 66.5 in | Wt 185.0 lb

## 2023-06-24 DIAGNOSIS — Z23 Encounter for immunization: Secondary | ICD-10-CM | POA: Diagnosis not present

## 2023-06-24 DIAGNOSIS — Z7901 Long term (current) use of anticoagulants: Secondary | ICD-10-CM

## 2023-06-24 DIAGNOSIS — I4819 Other persistent atrial fibrillation: Secondary | ICD-10-CM

## 2023-06-24 LAB — COAGUCHEK XS/INR WAIVED
INR: 2.4 — ABNORMAL HIGH (ref 0.9–1.1)
Prothrombin Time: 28.4 s

## 2023-06-24 NOTE — Progress Notes (Signed)
BP (!) 142/70   Pulse 85   Ht 5' 6.5" (1.689 m)   Wt 185 lb (83.9 kg)   SpO2 95%   BMI 29.41 kg/m    Subjective:   Patient ID: [name], male    DOB: 03-10-1932, 89 y.o.   MRN: [id number]  HPI: [name] is a 89 y.o. male presenting on 06/24/2023 for Medical Management of Chronic Issues, Atrial Fibrillation, and Eye Pain (Sees black spots)   HPI Coumadin recheck Target goal: 2.0-3.0 Reason on anticoagulation: Persistent A-fib Patient denies any bruising or bleeding or chest pain or palpitations   Relevant past medical, surgical, family and social history reviewed and updated as indicated. Interim medical history since our last visit reviewed. Allergies and medications reviewed and updated.  Review of Systems  Constitutional:  Negative for chills and fever.  Eyes:  Negative for visual disturbance.  Respiratory:  Negative for shortness of breath and wheezing.   Cardiovascular:  Negative for chest pain and leg swelling.  Musculoskeletal:  Negative for back pain and gait problem.  Skin:  Negative for rash.  All other systems reviewed and are negative.   Per HPI unless specifically indicated above   Allergies as of 06/24/2023       Reactions   Penicillins Hives, Rash   Has patient had a PCN reaction causing immediate rash, facial/tongue/throat swelling, SOB or lightheadedness with hypotension: Yes Has patient had a PCN reaction causing severe rash involving mucus membranes or skin necrosis: Yes Has patient had a PCN reaction that required hospitalization: No Has patient had a PCN reaction occurring within the last 10 years: No If all of the above answers are "NO", then may proceed with Cephalosporin use.   Hct [hydrochlorothiazide] Other (See Comments)   hyper   Statins Other (See Comments)   Myalgia, tolerates low dosages of lovastatin         Medication List        Accurate as of June 24, 2023  9:35 AM. If you have any questions, ask your nurse or  doctor.          acetaminophen 500 MG tablet Commonly known as: TYLENOL Take 1,000 mg by mouth every 6 (six) hours as needed for moderate pain.   diltiazem 120 MG 24 hr capsule Commonly known as: CARDIZEM CD TAKE ONE (1) CAPSULE EACH DAY   ezetimibe 10 MG tablet Commonly known as: ZETIA Take 1 tablet (10 mg total) by mouth daily.   finasteride 5 MG tablet Commonly known as: PROSCAR TAKE ONE (1) TABLET EACH DAY   fluticasone 50 MCG/ACT nasal spray Commonly known as: FLONASE Place 1 spray into both nostrils daily as needed for allergies or rhinitis.   furosemide 20 MG tablet Commonly known as: LASIX Take 1 tablet (20 mg total) by mouth daily.   lovastatin 40 MG tablet Commonly known as: MEVACOR Take 1 tablet (40 mg total) by mouth at bedtime.   methylphenidate 5 MG tablet Commonly known as: Ritalin Take 1 tablet (5 mg total) by mouth daily as needed.   metoprolol tartrate 50 MG tablet Commonly known as: LOPRESSOR Take 1 tablet (50 mg total) by mouth 2 (two) times daily.   montelukast 10 MG tablet Commonly known as: SINGULAIR Take 1 tablet (10 mg total) by mouth at bedtime.   PEMBROLIZUMAB IV Inject into the vein every 21 ( twenty-one) days.   Vitamin D3 125 MCG (5000 UT) Caps Take 5,000 Units by mouth once a week.  warfarin 5 MG tablet Commonly known as: COUMADIN Take as directed by the anticoagulation clinic. If you are unsure how to take this medication, talk to your nurse or doctor. Original instructions: TAKE 1 TABLET ON SAT, SUN, TUES, & THURSTAKE 1/2 TABLET ON MON, WED, & FRIDAY         Objective:   BP (!) 142/70   Pulse 85   Ht 5' 6.5" (1.689 m)   Wt 185 lb (83.9 kg)   SpO2 95%   BMI 29.41 kg/m   Wt Readings from Last 3 Encounters:  06/24/23 185 lb (83.9 kg)  06/08/23 187 lb 6.4 oz (85 kg)  05/18/23 189 lb 6.4 oz (85.9 kg)    Physical Exam Vitals and nursing note reviewed.  Constitutional:      Appearance: Normal appearance.  Eyes:      Conjunctiva/sclera: Conjunctivae normal.  Neurological:     Mental Status: He is alert.       Assessment & Plan:   Problem List Items Addressed This Visit       Cardiovascular and Mediastinum   Atrial fibrillation, persistent (HCC) - Primary   Relevant Orders   CoaguChek XS/INR Waived     Other   Long term current use of anticoagulant therapy    Description   Continue Thursdays and Saturdays take 1/2 tablet, Take 1 tablet all other days.   INR today is  2.4 (goal is 2-3)    Recheck in 4-6 weeks       Follow up plan: Return for 6-week INR.  Counseling provided for all of the vaccine components Orders Placed This Encounter  Procedures   CoaguChek XS/INR Waived    Arville Care, MD [facility] 06/24/2023, 9:35 AM

## 2023-06-29 ENCOUNTER — Inpatient Hospital Stay: Payer: Medicare Other | Attending: Hematology

## 2023-06-29 ENCOUNTER — Inpatient Hospital Stay: Payer: Medicare Other

## 2023-06-29 ENCOUNTER — Inpatient Hospital Stay: Payer: Medicare Other | Admitting: Hematology

## 2023-06-29 VITALS — BP 147/92 | HR 66 | Temp 96.4°F | Resp 20

## 2023-06-29 DIAGNOSIS — Z95828 Presence of other vascular implants and grafts: Secondary | ICD-10-CM

## 2023-06-29 DIAGNOSIS — Z5112 Encounter for antineoplastic immunotherapy: Secondary | ICD-10-CM | POA: Insufficient documentation

## 2023-06-29 DIAGNOSIS — C4442 Squamous cell carcinoma of skin of scalp and neck: Secondary | ICD-10-CM | POA: Insufficient documentation

## 2023-06-29 DIAGNOSIS — Z79620 Long term (current) use of immunosuppressive biologic: Secondary | ICD-10-CM | POA: Insufficient documentation

## 2023-06-29 LAB — COMPREHENSIVE METABOLIC PANEL
ALT: 14 U/L (ref 0–44)
AST: 18 U/L (ref 15–41)
Albumin: 3.8 g/dL (ref 3.5–5.0)
Alkaline Phosphatase: 59 U/L (ref 38–126)
Anion gap: 6 (ref 5–15)
BUN: 17 mg/dL (ref 8–23)
CO2: 28 mmol/L (ref 22–32)
Calcium: 8.8 mg/dL — ABNORMAL LOW (ref 8.9–10.3)
Chloride: 104 mmol/L (ref 98–111)
Creatinine, Ser: 1.16 mg/dL (ref 0.61–1.24)
GFR, Estimated: >60 mL/min (ref >60)
Glucose, Bld: 186 mg/dL — ABNORMAL HIGH (ref 70–99)
Potassium: 3.7 mmol/L (ref 3.5–5.1)
Sodium: 138 mmol/L (ref 135–145)
Total Bilirubin: 0.5 mg/dL (ref 0.3–1.2)
Total Protein: 7 g/dL (ref 6.5–8.1)

## 2023-06-29 LAB — CBC WITH DIFFERENTIAL/PLATELET
Abs Immature Granulocytes: 0.04 10*3/uL (ref 0.00–0.07)
Basophils Absolute: 0.1 10*3/uL (ref 0.0–0.1)
Basophils Relative: 0 %
Eosinophils Absolute: 0.7 10*3/uL — ABNORMAL HIGH (ref 0.0–0.5)
Eosinophils Relative: 6 %
HCT: 39.6 % (ref 39.0–52.0)
Hemoglobin: 13.1 g/dL (ref 13.0–17.0)
Immature Granulocytes: 0 %
Lymphocytes Relative: 34 %
Lymphs Abs: 3.9 10*3/uL (ref 0.7–4.0)
MCH: 33.7 pg (ref 26.0–34.0)
MCHC: 33.1 g/dL (ref 30.0–36.0)
MCV: 101.8 fL — ABNORMAL HIGH (ref 80.0–100.0)
Monocytes Absolute: 0.6 10*3/uL (ref 0.1–1.0)
Monocytes Relative: 5 %
Neutro Abs: 6.2 10*3/uL (ref 1.7–7.7)
Neutrophils Relative %: 55 %
Platelets: 194 10*3/uL (ref 150–400)
RBC: 3.89 MIL/uL — ABNORMAL LOW (ref 4.22–5.81)
RDW: 12.7 % (ref 11.5–15.5)
WBC: 11.4 10*3/uL — ABNORMAL HIGH (ref 4.0–10.5)
nRBC: 0 % (ref 0.0–0.2)

## 2023-06-29 LAB — MAGNESIUM: Magnesium: 2.1 mg/dL (ref 1.7–2.4)

## 2023-06-29 LAB — TSH: TSH: 1.393 u[IU]/mL (ref 0.350–4.500)

## 2023-06-29 MED ORDER — SODIUM CHLORIDE 0.9 % IV SOLN
200.0000 mg | Freq: Once | INTRAVENOUS | Status: AC
  Administered 2023-06-29: 200 mg via INTRAVENOUS
  Filled 2023-06-29: qty 8

## 2023-06-29 MED ORDER — SODIUM CHLORIDE 0.9% FLUSH
10.0000 mL | INTRAVENOUS | Status: DC | PRN
  Administered 2023-06-29: 10 mL via INTRAVENOUS

## 2023-06-29 MED ORDER — HEPARIN SOD (PORK) LOCK FLUSH 100 UNIT/ML IV SOLN
500.0000 [IU] | Freq: Once | INTRAVENOUS | Status: AC | PRN
  Administered 2023-06-29: 500 [IU]

## 2023-06-29 MED ORDER — SODIUM CHLORIDE 0.9% FLUSH
10.0000 mL | INTRAVENOUS | Status: DC | PRN
  Administered 2023-06-29: 10 mL

## 2023-06-29 MED ORDER — SODIUM CHLORIDE 0.9 % IV SOLN: Freq: Once | INTRAVENOUS | Status: AC

## 2023-06-29 NOTE — Patient Instructions (Signed)
[facility]  Discharge Instructions: Thank you for choosing [facility] to provide your oncology and hematology care.  If you have a lab appointment with the Cancer Center - please note that after April 8th, 2024, all labs will be drawn in the cancer center.  You do not have to check in or register with the main entrance as you have in the past but will complete your check-in in the cancer center.  Wear comfortable clothing and clothing appropriate for easy access to any Portacath or PICC line.   We strive to give you quality time with your provider. You may need to reschedule your appointment if you arrive late (15 or more minutes).  Arriving late affects you and other patients whose appointments are after yours.  Also, if you miss three or more appointments without notifying the office, you may be dismissed from the clinic at the provider's discretion.      For prescription refill requests, have your pharmacy contact our office and allow 72 hours for refills to be completed.    Today you received the following chemotherapy and/or immunotherapy agents Keytruda   To help prevent nausea and vomiting after your treatment, we encourage you to take your nausea medication as directed.  Pembrolizumab Injection What is this medication? PEMBROLIZUMAB (PEM broe LIZ ue mab) treats some types of cancer. It works by helping your immune system slow or stop the spread of cancer cells. It is a monoclonal antibody. This medicine may be used for other purposes; ask your health care provider or pharmacist if you have questions. COMMON BRAND NAME(S): Keytruda What should I tell my care team before I take this medication? They need to know if you have any of these conditions: Allogeneic stem cell transplant (uses someone else's stem cells) Autoimmune diseases, such as Crohn disease, ulcerative colitis, lupus History of chest radiation Nervous system problems, such as  Guillain-Barre syndrome, myasthenia gravis Organ transplant An unusual or allergic reaction to pembrolizumab, other medications, foods, dyes, or preservatives Pregnant or trying to get pregnant Breast-feeding How should I use this medication? This medication is injected into a vein. It is given by your care team in a hospital or clinic setting. A special MedGuide will be given to you before each treatment. Be sure to read this information carefully each time. Talk to your care team about the use of this medication in children. While it may be prescribed for children as young as 6 months for selected conditions, precautions do apply. Overdosage: If you think you have taken too much of this medicine contact a poison control center or emergency room at once. NOTE: This medicine is only for you. Do not share this medicine with others. What if I miss a dose? Keep appointments for follow-up doses. It is important not to miss your dose. Call your care team if you are unable to keep an appointment. What may interact with this medication? Interactions have not been studied. This list may not describe all possible interactions. Give your health care provider a list of all the medicines, herbs, non-prescription drugs, or dietary supplements you use. Also tell them if you smoke, drink alcohol, or use illegal drugs. Some items may interact with your medicine. What should I watch for while using this medication? Your condition will be monitored carefully while you are receiving this medication. You may need blood work while taking this medication. This medication may cause serious skin reactions. They can happen weeks to months  after starting the medication. Contact your care team right away if you notice fevers or flu-like symptoms with a rash. The rash may be red or purple and then turn into blisters or peeling of the skin. You may also notice a red rash with swelling of the face, lips, or lymph nodes in your  neck or under your arms. Tell your care team right away if you have any change in your eyesight. Talk to your care team if you may be pregnant. Serious birth defects can occur if you take this medication during pregnancy and for 4 months after the last dose. You will need a negative pregnancy test before starting this medication. Contraception is recommended while taking this medication and for 4 months after the last dose. Your care team can help you find the option that works for you. Do not breastfeed while taking this medication and for 4 months after the last dose. What side effects may I notice from receiving this medication? Side effects that you should report to your care team as soon as possible: Allergic reactions--skin rash, itching, hives, swelling of the face, lips, tongue, or throat Dry cough, shortness of breath or trouble breathing Eye pain, redness, irritation, or discharge with blurry or decreased vision Heart muscle inflammation--unusual weakness or fatigue, shortness of breath, chest pain, fast or irregular heartbeat, dizziness, swelling of the ankles, feet, or hands Hormone gland problems--headache, sensitivity to light, unusual weakness or fatigue, dizziness, fast or irregular heartbeat, increased sensitivity to cold or heat, excessive sweating, constipation, hair loss, increased thirst or amount of urine, tremors or shaking, irritability Infusion reactions--chest pain, shortness of breath or trouble breathing, feeling faint or lightheaded Kidney injury (glomerulonephritis)--decrease in the amount of urine, red or dark brown urine, foamy or bubbly urine, swelling of the ankles, hands, or feet Liver injury--right upper belly pain, loss of appetite, nausea, light-colored stool, dark yellow or brown urine, yellowing skin or eyes, unusual weakness or fatigue Pain, tingling, or numbness in the hands or feet, muscle weakness, change in vision, confusion or trouble speaking, loss of  balance or coordination, trouble walking, seizures Rash, fever, and swollen lymph nodes Redness, blistering, peeling, or loosening of the skin, including inside the mouth Sudden or severe stomach pain, bloody diarrhea, fever, nausea, vomiting Side effects that usually do not require medical attention (report to your care team if they continue or are bothersome): Bone, joint, or muscle pain Diarrhea Fatigue Loss of appetite Nausea Skin rash This list may not describe all possible side effects. Call your doctor for medical advice about side effects. You may report side effects to FDA at 1-800-FDA-1088. Where should I keep my medication? This medication is given in a hospital or clinic. It will not be stored at home. NOTE: This sheet is a summary. It may not cover all possible information. If you have questions about this medicine, talk to your doctor, pharmacist, or health care provider.  2024 Elsevier/Gold Standard (2022-02-10 00:00:00)   BELOW ARE SYMPTOMS THAT SHOULD BE REPORTED IMMEDIATELY: *FEVER GREATER THAN 100.4 F (38 C) OR HIGHER *CHILLS OR SWEATING *NAUSEA AND VOMITING THAT IS NOT CONTROLLED WITH YOUR NAUSEA MEDICATION *UNUSUAL SHORTNESS OF BREATH *UNUSUAL BRUISING OR BLEEDING *URINARY PROBLEMS (pain or burning when urinating, or frequent urination) *BOWEL PROBLEMS (unusual diarrhea, constipation, pain near the anus) TENDERNESS IN MOUTH AND THROAT WITH OR WITHOUT PRESENCE OF ULCERS (sore throat, sores in mouth, or a toothache) UNUSUAL RASH, SWELLING OR PAIN  UNUSUAL VAGINAL DISCHARGE OR ITCHING   Items  with * indicate a potential emergency and should be followed up as soon as possible or go to the Emergency Department if any problems should occur.  Please show the CHEMOTHERAPY ALERT CARD or IMMUNOTHERAPY ALERT CARD at check-in to the Emergency Department and triage nurse.  Should you have questions after your visit or need to cancel or reschedule your appointment, please  contact [facility] [phone]  and follow the prompts.  Office hours are 8:00 a.m. to 4:30 p.m. Monday - Friday. Please note that voicemails left after 4:00 p.m. may not be returned until the following business day.  We are closed weekends and major holidays. You have access to a nurse at all times for urgent questions. Please call the main number to the clinic [phone] and follow the prompts.  For any non-urgent questions, you may also contact your provider using MyChart. We now offer e-Visits for anyone 69 and older to request care online for non-urgent symptoms. For details visit [email or web address].   Also download the MyChart app! Go to the app store, search "MyChart", open the app, select [place], and log in with your MyChart username and password.

## 2023-06-29 NOTE — Progress Notes (Signed)
Patient presents today for Keytruda infusion.  Patient is in satisfactory condition with no new complaints voiced.  Vital signs are stable.  Labs reviewed and all labs are within treatment parameters.  We will proceed with treatment per MD orders.    Treatment given today per MD orders. Tolerated infusion without adverse affects. Vital signs stable. No complaints at this time. Discharged from clinic ambulatory in stable condition. Alert and oriented x 3. F/U with [facility] as scheduled.

## 2023-06-29 NOTE — Progress Notes (Signed)
Patients port flushed without difficulty.  Good blood return noted with no bruising or swelling noted at site.  Patient remains accessed for treatment.  

## 2023-07-16 ENCOUNTER — Ambulatory Visit (HOSPITAL_COMMUNITY)
Admission: RE | Admit: 2023-07-16 | Discharge: 2023-07-16 | Disposition: A | Discharge status: Home / Self Care | Payer: Medicare Other | Source: Ambulatory Visit | Attending: Hematology | Admitting: Hematology

## 2023-07-16 DIAGNOSIS — R591 Generalized enlarged lymph nodes: Secondary | ICD-10-CM | POA: Insufficient documentation

## 2023-07-16 DIAGNOSIS — R911 Solitary pulmonary nodule: Secondary | ICD-10-CM | POA: Diagnosis not present

## 2023-07-16 DIAGNOSIS — I517 Cardiomegaly: Secondary | ICD-10-CM | POA: Insufficient documentation

## 2023-07-16 DIAGNOSIS — C799 Secondary malignant neoplasm of unspecified site: Secondary | ICD-10-CM | POA: Diagnosis not present

## 2023-07-16 DIAGNOSIS — C4442 Squamous cell carcinoma of skin of scalp and neck: Secondary | ICD-10-CM | POA: Insufficient documentation

## 2023-07-16 DIAGNOSIS — K7460 Unspecified cirrhosis of liver: Secondary | ICD-10-CM | POA: Insufficient documentation

## 2023-07-16 DIAGNOSIS — K5730 Diverticulosis of large intestine without perforation or abscess without bleeding: Secondary | ICD-10-CM | POA: Insufficient documentation

## 2023-07-16 DIAGNOSIS — I700 Atherosclerosis of aorta: Secondary | ICD-10-CM | POA: Diagnosis not present

## 2023-07-16 MED ORDER — HEPARIN SOD (PORK) LOCK FLUSH 100 UNIT/ML IV SOLN
INTRAVENOUS | Status: AC
  Filled 2023-07-16: qty 5

## 2023-07-16 MED ORDER — IOHEXOL 300 MG/ML  SOLN
100.0000 mL | Freq: Once | INTRAMUSCULAR | Status: AC | PRN
  Administered 2023-07-16: 100 mL via INTRAVENOUS

## 2023-07-16 MED ORDER — HEPARIN SOD (PORK) LOCK FLUSH 100 UNIT/ML IV SOLN
500.0000 [IU] | Freq: Once | INTRAVENOUS | Status: AC
  Administered 2023-07-16: 500 [IU] via INTRAVENOUS

## 2023-07-20 ENCOUNTER — Inpatient Hospital Stay: Payer: Medicare Other

## 2023-07-20 ENCOUNTER — Inpatient Hospital Stay: Payer: Medicare Other | Attending: Hematology

## 2023-07-20 ENCOUNTER — Encounter: Payer: Self-pay | Admitting: Hematology

## 2023-07-20 ENCOUNTER — Inpatient Hospital Stay: Payer: Medicare Other | Admitting: Hematology

## 2023-07-20 VITALS — BP 144/85 | HR 78 | Temp 97.8°F | Resp 18

## 2023-07-20 DIAGNOSIS — Z801 Family history of malignant neoplasm of trachea, bronchus and lung: Secondary | ICD-10-CM | POA: Diagnosis not present

## 2023-07-20 DIAGNOSIS — Z79620 Long term (current) use of immunosuppressive biologic: Secondary | ICD-10-CM | POA: Insufficient documentation

## 2023-07-20 DIAGNOSIS — C9110 Chronic lymphocytic leukemia of B-cell type not having achieved remission: Secondary | ICD-10-CM

## 2023-07-20 DIAGNOSIS — C4442 Squamous cell carcinoma of skin of scalp and neck: Secondary | ICD-10-CM | POA: Diagnosis not present

## 2023-07-20 DIAGNOSIS — Z5112 Encounter for antineoplastic immunotherapy: Secondary | ICD-10-CM | POA: Insufficient documentation

## 2023-07-20 DIAGNOSIS — Z95828 Presence of other vascular implants and grafts: Secondary | ICD-10-CM | POA: Diagnosis not present

## 2023-07-20 DIAGNOSIS — R530 Neoplastic (malignant) related fatigue: Secondary | ICD-10-CM | POA: Diagnosis not present

## 2023-07-20 DIAGNOSIS — C760 Malignant neoplasm of head, face and neck: Secondary | ICD-10-CM | POA: Insufficient documentation

## 2023-07-20 DIAGNOSIS — Z87891 Personal history of nicotine dependence: Secondary | ICD-10-CM | POA: Diagnosis not present

## 2023-07-20 DIAGNOSIS — D72820 Lymphocytosis (symptomatic): Secondary | ICD-10-CM | POA: Insufficient documentation

## 2023-07-20 DIAGNOSIS — R918 Other nonspecific abnormal finding of lung field: Secondary | ICD-10-CM | POA: Insufficient documentation

## 2023-07-20 LAB — CBC WITH DIFFERENTIAL/PLATELET
Abs Immature Granulocytes: 0.05 10*3/uL (ref 0.00–0.07)
Basophils Absolute: 0.1 10*3/uL (ref 0.0–0.1)
Basophils Relative: 0 %
Eosinophils Absolute: 1 10*3/uL — ABNORMAL HIGH (ref 0.0–0.5)
Eosinophils Relative: 8 %
HCT: 40.5 % (ref 39.0–52.0)
Hemoglobin: 13.4 g/dL (ref 13.0–17.0)
Immature Granulocytes: 0 %
Lymphocytes Relative: 38 %
Lymphs Abs: 4.9 10*3/uL — ABNORMAL HIGH (ref 0.7–4.0)
MCH: 34 pg (ref 26.0–34.0)
MCHC: 33.1 g/dL (ref 30.0–36.0)
MCV: 102.8 fL — ABNORMAL HIGH (ref 80.0–100.0)
Monocytes Absolute: 0.7 10*3/uL (ref 0.1–1.0)
Monocytes Relative: 6 %
Neutro Abs: 6.2 10*3/uL (ref 1.7–7.7)
Neutrophils Relative %: 48 %
Platelets: 198 10*3/uL (ref 150–400)
RBC: 3.94 MIL/uL — ABNORMAL LOW (ref 4.22–5.81)
RDW: 12.6 % (ref 11.5–15.5)
WBC: 12.9 10*3/uL — ABNORMAL HIGH (ref 4.0–10.5)
nRBC: 0 % (ref 0.0–0.2)

## 2023-07-20 LAB — COMPREHENSIVE METABOLIC PANEL
ALT: 16 U/L (ref 0–44)
AST: 17 U/L (ref 15–41)
Albumin: 3.9 g/dL (ref 3.5–5.0)
Alkaline Phosphatase: 59 U/L (ref 38–126)
Anion gap: 10 (ref 5–15)
BUN: 17 mg/dL (ref 8–23)
CO2: 26 mmol/L (ref 22–32)
Calcium: 8.8 mg/dL — ABNORMAL LOW (ref 8.9–10.3)
Chloride: 101 mmol/L (ref 98–111)
Creatinine, Ser: 1.05 mg/dL (ref 0.61–1.24)
GFR, Estimated: >60 mL/min (ref >60)
Glucose, Bld: 83 mg/dL (ref 70–99)
Potassium: 3.8 mmol/L (ref 3.5–5.1)
Sodium: 137 mmol/L (ref 135–145)
Total Bilirubin: 0.7 mg/dL (ref 0.3–1.2)
Total Protein: 7.4 g/dL (ref 6.5–8.1)

## 2023-07-20 LAB — MAGNESIUM: Magnesium: 2 mg/dL (ref 1.7–2.4)

## 2023-07-20 MED ORDER — SODIUM CHLORIDE 0.9% FLUSH
10.0000 mL | INTRAVENOUS | Status: DC | PRN
  Administered 2023-07-20: 10 mL via INTRAVENOUS

## 2023-07-20 MED ORDER — SODIUM CHLORIDE 0.9 % IV SOLN: Freq: Once | INTRAVENOUS | Status: AC

## 2023-07-20 MED ORDER — SODIUM CHLORIDE 0.9 % IV SOLN
200.0000 mg | Freq: Once | INTRAVENOUS | Status: AC
  Administered 2023-07-20: 200 mg via INTRAVENOUS
  Filled 2023-07-20: qty 8

## 2023-07-20 MED ORDER — HEPARIN SOD (PORK) LOCK FLUSH 100 UNIT/ML IV SOLN
500.0000 [IU] | Freq: Once | INTRAVENOUS | Status: AC | PRN
  Administered 2023-07-20: 500 [IU]

## 2023-07-20 MED ORDER — SODIUM CHLORIDE 0.9% FLUSH
10.0000 mL | INTRAVENOUS | Status: DC | PRN
  Administered 2023-07-20: 10 mL

## 2023-07-20 NOTE — Patient Instructions (Signed)
[facility] [place]  Discharge Instructions: Thank you for choosing [facility] to provide your oncology and hematology care.  If you have a lab appointment with the Cancer Center - please note that after April 8th, 2024, all labs will be drawn in the cancer center.  You do not have to check in or register with the main entrance as you have in the past but will complete your check-in in the cancer center.  Wear comfortable clothing and clothing appropriate for easy access to any Portacath or PICC line.   We strive to give you quality time with your provider. You may need to reschedule your appointment if you arrive late (15 or more minutes).  Arriving late affects you and other patients whose appointments are after yours.  Also, if you miss three or more appointments without notifying the office, you may be dismissed from the clinic at the provider's discretion.      For prescription refill requests, have your pharmacy contact our office and allow 72 hours for refills to be completed.    Today you received the following chemotherapy and/or immunotherapy agents Keytruda      To help prevent nausea and vomiting after your treatment, we encourage you to take your nausea medication as directed.  BELOW ARE SYMPTOMS THAT SHOULD BE REPORTED IMMEDIATELY: *FEVER GREATER THAN 100.4 F (38 C) OR HIGHER *CHILLS OR SWEATING *NAUSEA AND VOMITING THAT IS NOT CONTROLLED WITH YOUR NAUSEA MEDICATION *UNUSUAL SHORTNESS OF BREATH *UNUSUAL BRUISING OR BLEEDING *URINARY PROBLEMS (pain or burning when urinating, or frequent urination) *BOWEL PROBLEMS (unusual diarrhea, constipation, pain near the anus) TENDERNESS IN MOUTH AND THROAT WITH OR WITHOUT PRESENCE OF ULCERS (sore throat, sores in mouth, or a toothache) UNUSUAL RASH, SWELLING OR PAIN  UNUSUAL VAGINAL DISCHARGE OR ITCHING   Items with * indicate a potential emergency and should be followed up as soon as possible or go to the  Emergency Department if any problems should occur.  Please show the CHEMOTHERAPY ALERT CARD or IMMUNOTHERAPY ALERT CARD at check-in to the Emergency Department and triage nurse.  Should you have questions after your visit or need to cancel or reschedule your appointment, please contact [facility] [place] [phone]  and follow the prompts.  Office hours are 8:00 a.m. to 4:30 p.m. Monday - Friday. Please note that voicemails left after 4:00 p.m. may not be returned until the following business day.  We are closed weekends and major holidays. You have access to a nurse at all times for urgent questions. Please call the main number to the clinic [phone] and follow the prompts.  For any non-urgent questions, you may also contact your provider using MyChart. We now offer e-Visits for anyone 18 and older to request care online for non-urgent symptoms. For details visit [email or web address].   Also download the MyChart app! Go to the app store, search "MyChart", open the app, select [place], and log in with your MyChart username and password.   

## 2023-07-20 NOTE — Progress Notes (Signed)
[facility] [address], [place] [address]    Clinic Day:  07/20/23   Referring physician: [name], Elige Radon, MD  Patient Care Team: [name], Elige Radon, MD as PCP - General (Family Medicine) [name], MD as PCP - Cardiology (Cardiology) Bjorn Pippin, MD as Attending Physician (Urology) [name], MD as Consulting Physician (Cardiology) [name], MD (Inactive) as Consulting Physician (Gastroenterology) [name], MD as Consulting Physician (Ophthalmology) [name], MD as Consulting Physician (Orthopedic Surgery) [name], [name], LCSW as [facility] Care Management (Licensed Clinical Social Worker) [name], MD as Medical Oncologist (Medical Oncology) [name], RN as Oncology Nurse Navigator (Medical Oncology)   ASSESSMENT & PLAN:   Assessment: 1.  Right supraclavicular lymphadenopathy and multiple lung nodules: - Patient noticed right neck mass since Christmas 2022.  10 to 15 pound weight loss since January 2023.  Denies any dysphagia or odynophagia. - CT neck (05/27/2022): Numerous lymph nodes in the right neck, right supraclavicular lymph node mass measures 5.7 x 3.7 cm.  18 mm posterior lymph node in the right lower neck.  Numerous lymph nodes in the right lower and posterior neck approximately 1 cm.  Many have internal necrosis consistent with metastatic disease.  20 mm lymph node just above the right clavicle.  Left level 2 node 12 mm. - CT CAP (05/28/2022): Several bilateral lung nodules, RUL nodule measuring 1.8 x 1.3 cm.  Small clusters of nodules in the medial left upper lobe.  Irregular nodular density along the left side of the mediastinum measuring 2.1 cm.  No metastatic disease in the abdomen or pelvis.  Liver is slightly nodular contour. - Lab work shows elevated white count since 2009, predominantly lymphocytosis. - Pathology (06/09/2022): Metastatic carcinoma positive for p40, p63  and CK5/6-patchy positivity with CK7.  Negative for TTF-1, Napsin A, PAX8, prostein, PSA, CK20 and CDX2.  - PET scan (06/11/2022): Bulky right supraclavicular nodal mass, smaller hypermetabolic right posterior triangle and jugular lymph nodes and single hypermetabolic left level 3 lymph node.  Bilateral hypermetabolic pulmonary nodules and metastatic pattern.  Minimal hypermetabolic mediastinal nodal metastasis.  Cluster of hypermetabolic right axillary lymph nodes.  No evidence of metastatic disease or primary lesion in the abdomen or pelvis.  No bone lesions. - MRI of the brain: Chronic small vessel ischemic changes with no evidence of metastatic disease. - NGS: T p53 pathogenic variant, MSI-stable, TMB-low, LOH-low, PD-L1 ([id number]): Negative, p16 and p18 negative.  HER2 by IHC 0. - PD-L1 22 C3: CPS score 25 - Cancer type ID: 96% probability squamous cell carcinoma, subtype head and neck/skin.  Cancer types ruled out with 95% confidence includes skin basal cell carcinoma. - Carboplatin, Taxol and pembrolizumab 07/23/2022 through 11/09/22   2.  Social/family history: - He lives at home with his wife.  Independent of ADLs and IADLs.  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  May have had asbestos exposure 30 years ago.  Quit smoking cigarettes in 1965.  Smoked 1 pack/day for less than 12 years.  2 of his brothers had lung cancer and both were smokers.    Plan: 1.  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 - He is tolerating Keytruda very well.  No immunotherapy related side effects. - Labs from today with normal LFTs and creatinine.  CBC with leukocytosis. - CT CAP on 07/16/2023: Mild progression of metastatic disease in the mediastinal lymph nodes with growth of around 3 to 4 mm.  New mild right axillary and right  subcarinal lymph node. - He also has a B-cell lymphoproliferative disorder, consistent with CLL.  Hence I have recommended biopsy of the right axillary lymph node by IR. - If it shows CLL, we will continue Keytruda.  Otherwise we will talk about changing treatments. - He will proceed with his treatment today.  RTC 3 weeks for follow-up.    2.  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  No other significant cytopenias.  3.  Cancer-related fatigue: - Continue Ritalin 5 mg in the mornings as needed.  He is taking it 3 times weekly.  It is helping with his energy levels.     Orders Placed This Encounter  Procedures   [place] CORE BIOPSY (LYMPH NODES)    Standing Status:   Future    Standing Expiration Date:   07/19/2024    Order Specific Question:   Lab orders requested (DO NOT place separate lab orders, these will be automatically ordered during procedure specimen collection):    Answer:   Surgical Pathology    Order Specific Question:   Reason for Exam (SYMPTOM  OR DIAGNOSIS REQUIRED)    Answer:   squamous cell cancer vs. CLL    Order Specific Question:   Preferred location?    Answer:   [facility]    Order Specific Question:   Release to patient    Answer:   Immediate      I,Helena R Teague,acting as a scribe for [name], MD.,have documented all relevant documentation on the behalf of [name], MD,as directed by  [name], MD while in the presence of [name], MD.  I, [name] MD, have reviewed the above documentation for accuracy and completeness, and I agree with the above.      [name], MD   10/8/20244:35 PM  CHIEF COMPLAINT:   Diagnosis: poorly differentiated squamous cell carcinoma    Cancer Staging  Squamous cell carcinoma of head and neck Staging form: Cervical Lymph Nodes and Unknown Primary Tumors of the Head and Neck,  AJCC 8th Edition - Clinical stage from 07/16/2022: Stage IVC (cT0, cN3b, cM1) - Unsigned    Prior Therapy: Carboplatin, paclitaxel and pembrolizumab, 07/23/22 - 11/09/22  Current Therapy:  pembrolizumab alone    HISTORY OF PRESENT ILLNESS:   Oncology History  Squamous cell carcinoma of head and neck  07/16/2022 Initial Diagnosis   Squamous cell carcinoma of head and neck (HCC)   07/23/2022 -  Chemotherapy   Patient is on Treatment Plan : Carboplatin  + Paclitaxel  + Pembrolizumab (200) D1 q21d        INTERVAL HISTORY:   Cordelro is a 89 y.o. male presenting to clinic today for follow up of poorly differentiated squamous cell carcinoma. He was last seen by me on 06/08/23.  Since his last visit, he underwent CT C/A/P on 07/16/23 that found: suggestive mild progression of metastatic disease; new mild right axillary and right subcarinal lymphadenopathy; right paratracheal adenopathy is mildly increased; stable right hilar adenopathy; stable peripheral right upper lobe pulmonary nodule; no evidence of metastatic disease in the abdomen or pelvis; cirrhosis; no ascites; normal size spleen; moderate left colonic diverticulosis; and mild cardiomegaly.  Today, he states that he is doing well overall. His appetite level  is at 100%. His energy level is at 0%. He is accompanied by his wife. He is accompanied by his wife.   He c/o fatigue and takes Ritalin every other day, noticing an increase in energy the day he takes it. He reports an increase in light sensitivity. He notes intermittent chest pain and pain between the shoulders, particularly when he lies on his back.   PAST MEDICAL HISTORY:   Past Medical History: Past Medical History:  Diagnosis Date   Anemia    Atrial fibrillation (HCC)    Cataract    Cellulitis    In past   Complication of anesthesia    HARD TIME WAKING; CAUSES MY BP TO GO UP    Coronary artery disease    5 bypasses   DJD (degenerative joint disease)    Ejection  fraction < 50%    Mildly reduced, 40% by echo   GERD (gastroesophageal reflux disease)    Hyperlipidemia    Hypertension    Persistent atrial fibrillation (HCC)    Prostate hypertrophy    on CT scan 09/2014   PUD (peptic ulcer disease)    RESOLVED    Skin cancer of eyelid    Resected    Surgical History: Past Surgical History:  Procedure Laterality Date   APPENDECTOMY     BYPASS GRAFT  2005   CATARACT EXTRACTION W/PHACO Left 07/22/2015   Procedure: CATARACT EXTRACTION PHACO AND INTRAOCULAR LENS PLACEMENT LEFT EYE CDE=8.14;  Surgeon: [name], MD;  Location: [facility];  Service: Ophthalmology;  Laterality: Left;   CATARACT EXTRACTION W/PHACO Right 08/18/2019   Procedure: CATARACT EXTRACTION PHACO AND INTRAOCULAR LENS PLACEMENT (IOC);  Surgeon: Fabio Pierce, MD;  Location: [facility];  Service: Ophthalmology;  Laterality: Right;  CDE: 9.74   CORONARY ARTERY BYPASS GRAFT  4/05   LIMA to LAD, SVG to PDA, SVG to ramus intermediate   EYE SURGERY  07/2015   EYELID CARCINOMA EXCISION     Skin cancer resected   Heart bypass  2005   LYMPH NODE BIOPSY Right 06/09/2022   Procedure: LYMPH NODE BIOPSY; supraclavicular;  Surgeon: Lucretia Roers, MD;  Location: [facility];  Service: General;  Laterality: Right;   PORTACATH PLACEMENT Left 06/09/2022   Procedure: INSERTION PORT-A-CATH;  Surgeon: Lucretia Roers, MD;  Location: [facility];  Service: General;  Laterality: Left;   TOTAL HIP ARTHROPLASTY     Right   TOTAL HIP ARTHROPLASTY Left 05/04/2018   Procedure: LEFT TOTAL HIP ARTHROPLASTY;  Surgeon: [name], MD;  Location: [facility];  Service: Orthopedics;  Laterality: Left;   TOTAL KNEE ARTHROPLASTY     Right    Social History: Social History   Socioeconomic History   Marital status: Married    Spouse name: Talbert Forest   Number of children: 1   Years of education: 10   Highest education level: 10th grade  Occupational History   Occupation: retired  Tobacco Use   Smoking status: Former     Current packs/day: 0.00    Average packs/day: 2.0 packs/day for 5.0 years (10.0 ttl pk-yrs)    Types: Cigarettes    Start date: 12/23/1958    Quit date: 12/23/1963    Years since quitting: 59.6   Smokeless tobacco: Never  Vaping Use   Vaping status: Never Used  Substance and Sexual Activity   Alcohol use: No   Drug use: No   Sexual activity: Yes  Other Topics Concern   Not on file  Social History Narrative  Lives in a split level home.   Social Determinants of Health   Financial Resource Strain: Low Risk  (01/31/2023)   Overall Financial Resource Strain (CARDIA)    Difficulty of Paying Living Expenses: Not hard at all  Food Insecurity: No Food Insecurity (01/31/2023)   Hunger Vital Sign    Worried About Running Out of Food in the Last Year: Never true    Ran Out of Food in the Last Year: Never true  Transportation Needs: No Transportation Needs (01/31/2023)   PRAPARE - Administrator, Civil Service (Medical): No    Lack of Transportation (Non-Medical): No  Physical Activity: Inactive (01/31/2023)   Exercise Vital Sign    Days of Exercise per Week: 0 days    Minutes of Exercise per Session: 30 min  Stress: No Stress Concern Present (01/31/2023)   [employer] of Occupational Health - Occupational Stress Questionnaire    Feeling of Stress : Only a little  Social Connections: Socially Isolated (01/31/2023)   Social Connection and Isolation Panel [NHANES]    Frequency of Communication with Friends and Family: Once a week    Frequency of Social Gatherings with Friends and Family: Once a week    Attends Religious Services: Never    Database administrator or Organizations: No    Attends Banker Meetings: Never    Marital Status: Married  Catering manager Violence: Not At Risk (01/06/2023)   Humiliation, Afraid, Rape, and Kick questionnaire    Fear of Current or Ex-Partner: No    Emotionally Abused: No    Physically Abused: No    Sexually Abused: No     Family History: Family History  Problem Relation Age of Onset   Stroke Mother    Stroke Father    Hypertension Father    Early death Brother        45 months old   Cancer Brother        lung   Stroke Brother        heat    Current Medications:  Current Outpatient Medications:    acetaminophen (TYLENOL) 500 MG tablet, Take 1,000 mg by mouth every 6 (six) hours as needed for moderate pain., Disp: , Rfl:    Cholecalciferol (VITAMIN D3) 5000 units CAPS, Take 5,000 Units by mouth once a week. , Disp: , Rfl:    diltiazem (CARDIZEM CD) 120 MG 24 hr capsule, TAKE ONE (1) CAPSULE EACH DAY, Disp: 90 capsule, Rfl: 3   ezetimibe (ZETIA) 10 MG tablet, Take 1 tablet (10 mg total) by mouth daily., Disp: 90 tablet, Rfl: 3   finasteride (PROSCAR) 5 MG tablet, TAKE ONE (1) TABLET EACH DAY, Disp: 90 tablet, Rfl: 3   fluticasone (FLONASE) 50 MCG/ACT nasal spray, Place 1 spray into both nostrils daily as needed for allergies or rhinitis., Disp: , Rfl:    furosemide (LASIX) 20 MG tablet, Take 1 tablet (20 mg total) by mouth daily., Disp: 90 tablet, Rfl: 3   lovastatin (MEVACOR) 40 MG tablet, Take 1 tablet (40 mg total) by mouth at bedtime., Disp: 90 tablet, Rfl: 3   methylphenidate (RITALIN) 5 MG tablet, Take 1 tablet (5 mg total) by mouth daily as needed., Disp: 30 tablet, Rfl: 0   metoprolol tartrate (LOPRESSOR) 50 MG tablet, Take 1 tablet (50 mg total) by mouth 2 (two) times daily., Disp: 180 tablet, Rfl: 3   montelukast (SINGULAIR) 10 MG tablet, Take 1 tablet (10 mg total) by mouth at bedtime., Disp:  90 tablet, Rfl: 3   PEMBROLIZUMAB IV, Inject into the vein every 21 ( twenty-one) days., Disp: , Rfl:    warfarin (COUMADIN) 5 MG tablet, TAKE 1 TABLET ON SAT, SUN, TUES, & THURSTAKE 1/2 TABLET ON MON, WED, & FRIDAY, Disp: 70 tablet, Rfl: 3 No current facility-administered medications for this visit.  Facility-Administered Medications Ordered in Other Visits:    sodium chloride flush (NS) 0.9 %  injection 10 mL, 10 mL, Intracatheter, PRN, [name], MD, 10 mL at 07/20/23 1507   Allergies: Allergies  Allergen Reactions   Penicillins Hives and Rash    Has patient had a PCN reaction causing immediate rash, facial/tongue/throat swelling, SOB or lightheadedness with hypotension: Yes Has patient had a PCN reaction causing severe rash involving mucus membranes or skin necrosis: Yes Has patient had a PCN reaction that required hospitalization: No Has patient had a PCN reaction occurring within the last 10 years: No If all of the above answers are "NO", then may proceed with Cephalosporin use.    Hct [Hydrochlorothiazide] Other (See Comments)    hyper   Statins Other (See Comments)    Myalgia, tolerates low dosages of lovastatin     REVIEW OF SYSTEMS:   Review of Systems  Constitutional:  Negative for chills, fatigue and fever.  HENT:   Negative for lump/mass, mouth sores, nosebleeds, sore throat and trouble swallowing.        +light sensitivity  +burning sensation on the eyes  Eyes:  Negative for eye problems.  Respiratory:  Positive for cough. Negative for shortness of breath.   Cardiovascular:  Positive for chest pain (5/10 severity). Negative for leg swelling and palpitations.  Gastrointestinal:  Negative for abdominal pain, constipation, diarrhea, nausea and vomiting.  Genitourinary:  Positive for dysuria. Negative for bladder incontinence, difficulty urinating, frequency, hematuria and nocturia.   Musculoskeletal:  Negative for arthralgias, back pain, flank pain, myalgias and neck pain.       +pain between shoulder blades, 5/10 severity  Skin:  Positive for itching and rash.  Neurological:  Positive for dizziness and numbness (in hands and feet). Negative for headaches.  Hematological:  Does not bruise/bleed easily.  Psychiatric/Behavioral:  Negative for depression, sleep disturbance and suicidal ideas. The patient is not nervous/anxious.   All other systems  reviewed and are negative.    VITALS:   There were no vitals taken for this visit.  Wt Readings from Last 3 Encounters:  07/20/23 186 lb 12.8 oz (84.7 kg)  06/29/23 188 lb 3.2 oz (85.4 kg)  06/24/23 185 lb (83.9 kg)    There is no height or weight on file to calculate BMI.  Performance status (ECOG): 1 - Symptomatic but completely ambulatory  PHYSICAL EXAM:   Physical Exam Vitals and nursing note reviewed. Exam conducted with a chaperone present.  Constitutional:      Appearance: Normal appearance.  Cardiovascular:     Rate and Rhythm: Normal rate and regular rhythm.     Pulses: Normal pulses.     Heart sounds: Normal heart sounds.  Pulmonary:     Effort: Pulmonary effort is normal.     Breath sounds: Normal breath sounds.  Abdominal:     Palpations: Abdomen is soft. There is no hepatomegaly, splenomegaly or mass.     Tenderness: There is no abdominal tenderness.  Musculoskeletal:     Right lower leg: No edema.     Left lower leg: No edema.  Lymphadenopathy:     Cervical: No cervical adenopathy.  Right cervical: No superficial, deep or posterior cervical adenopathy.    Left cervical: No superficial, deep or posterior cervical adenopathy.     Upper Body:     Right upper body: No supraclavicular or axillary adenopathy.     Left upper body: No supraclavicular or axillary adenopathy.  Neurological:     General: No focal deficit present.     Mental Status: He is alert and oriented to person, place, and time.  Psychiatric:        Mood and Affect: Mood normal.        Behavior: Behavior normal.     LABS:      Latest Ref Rng & Units 07/20/2023   11:55 AM 06/29/2023   12:07 PM 06/08/2023   12:15 PM  CBC  WBC 4.0 - 10.5 K/uL 12.9  11.4  12.7   Hemoglobin 13.0 - 17.0 g/dL 95.6  21.3  08.6   Hematocrit 39.0 - 52.0 % 40.5  39.6  39.1   Platelets 150 - 400 K/uL 198  194  175       Latest Ref Rng & Units 07/20/2023   11:55 AM 06/29/2023   12:07 PM 06/08/2023   12:15 PM   CMP  Glucose 70 - 99 mg/dL 83  578  88   BUN 8 - 23 mg/dL 17  17  20    Creatinine 0.61 - 1.24 mg/dL 4.69  6.29  5.28   Sodium 135 - 145 mmol/L 137  138  138   Potassium 3.5 - 5.1 mmol/L 3.8  3.7  4.0   Chloride 98 - 111 mmol/L 101  104  105   CO2 22 - 32 mmol/L 26  28  27    Calcium 8.9 - 10.3 mg/dL 8.8  8.8  8.7   Total Protein 6.5 - 8.1 g/dL 7.4  7.0  6.9   Total Bilirubin 0.3 - 1.2 mg/dL 0.7  0.5  0.8   Alkaline Phos 38 - 126 U/L 59  59  55   AST 15 - 41 U/L 17  18  18    ALT 0 - 44 U/L 16  14  15       Lab Results  Component Value Date   CEA1 5.4 (H) 06/03/2022   /  CEA  Date Value Ref Range Status  06/03/2022 5.4 (H) 0.0 - 4.7 ng/mL Final    Comment:    (NOTE)                             Nonsmokers          <3.9                             Smokers             <5.6 Roche [employer]ostics Electrochemiluminescence Immunoassay (ECLIA) Values obtained with different assay methods or kits cannot be used interchangeably.  Results cannot be interpreted as absolute evidence of the presence or absence of malignant disease. Performed At: [facility] [address] [place], [place] [id number] Jolene Schimke MD UV:[id number]    Lab Results  Component Value Date   PSA1 11.3 (H) 02/16/2022   Lab Results  Component Value Date   CAN199 24 (H) 06/03/2022   No results found for: "CAN125"  No results found for: "TOTALPROTELP", "ALBUMINELP", "A1GS", "A2GS", "BETS", "BETA2SER", "GAMS", "MSPIKE", "SPEI" No results found for: "TIBC", "FERRITIN", "IRONPCTSAT" Lab  Results  Component Value Date   LDH 164 06/03/2022     STUDIES:   CT CHEST ABDOMEN PELVIS W CONTRAST  Result Date: 07/20/2023 CLINICAL DATA:  Head and neck squamous cell carcinoma. Staging evaluation. * Tracking Code: BO * EXAM: CT CHEST, ABDOMEN, AND PELVIS WITH CONTRAST TECHNIQUE: Multidetector CT imaging of the chest, abdomen and pelvis was performed following the standard protocol during bolus administration of  intravenous contrast. RADIATION DOSE REDUCTION: This exam was performed according to the departmental dose-optimization program which includes automated exposure control, adjustment of the mA and/or kV according to patient size and/or use of iterative reconstruction technique. CONTRAST:  OMNIPAQUE IOHEXOL 300 MG/ML  SOLN COMPARISON:  04/21/2023 CT chest, abdomen and pelvis. FINDINGS: CT CHEST FINDINGS Cardiovascular: Mild cardiomegaly. No significant pericardial effusion/thickening. Three-vessel coronary atherosclerosis status post CABG. Atherosclerotic nonaneurysmal thoracic aorta. Normal caliber pulmonary arteries. No central pulmonary emboli. Mediastinum/Nodes: No significant thyroid nodules. Unremarkable esophagus. Newly mildly enlarged 1.1 cm short axis diameter right axillary node (series 2/image 29). No left axillary adenopathy. Stable enlarged 3.1 cm right hilar node (series 2/image 27). Right paratracheal adenopathy is mildly increased, for example 1.8 cm (series 2/image 23), previously 1.5 cm. Mildly enlarged 1.1 cm right subcarinal node (series 2/image 30), new. No left hilar adenopathy. Lungs/Pleura: No pneumothorax. No pleural effusion. No acute consolidative airspace disease or lung masses. Peripheral right upper lobe 1.5 x 0.7 cm nodule (series 3/image 47), previously 1.5 x 0.7 cm, stable. No new significant pulmonary nodules. Musculoskeletal: No aggressive appearing focal osseous lesions. Intact sternotomy wires. Moderate thoracic spondylosis. CT ABDOMEN PELVIS FINDINGS Hepatobiliary: Diffusely mildly irregular liver surface compatible cirrhosis. Subcentimeter hypodense peripheral right liver lesion is too small to characterize and unchanged. No new liver lesions. Cholelithiasis. No biliary ductal dilatation. Pancreas: Normal, with no mass or duct dilation. Spleen: Normal size. No mass. Adrenals/Urinary Tract: Normal adrenals. Simple bilateral renal cysts, largest 2.7 cm in the posterior  interpolar left kidney with additional subcentimeter hypodense bilateral renal cortical lesions are too small to characterize, for which no follow-up imaging is recommended. No hydronephrosis. Obscured bladder by bilateral hip hardware with no gross bladder abnormality. Stomach/Bowel: Normal non-distended stomach. Normal caliber small bowel with no small bowel wall thickening. Appendix not discretely visualized. Moderate left colonic diverticulosis with no large bowel wall thickening or significant pericolonic fat stranding. Vascular/Lymphatic: Atherosclerotic nonaneurysmal abdominal aorta. Patent portal, splenic, hepatic and renal veins. No pathologically enlarged lymph nodes in the abdomen or pelvis. Reproductive: Prostate obscured by streak artifact from hip hardware. Other: No pneumoperitoneum, ascites or focal fluid collection. Musculoskeletal: No aggressive appearing focal osseous lesions. Status post bilateral total hip arthroplasty. Moderate lumbar spondylosis. IMPRESSION: 1. Findings suggest mild progression of metastatic disease. New mild right axillary and right subcarinal lymphadenopathy. Right paratracheal adenopathy is mildly increased. Stable right hilar adenopathy. Stable peripheral right upper lobe pulmonary nodule. 2. No evidence of metastatic disease in the abdomen or pelvis. 3. Cirrhosis. No ascites. Normal size spleen. 4. Moderate left colonic diverticulosis. 5. Mild cardiomegaly. 6.  Aortic Atherosclerosis (ICD10-I70.0). Electronically Signed   By: Delbert Phenix M.D.   On: 07/20/2023 10:02

## 2023-07-20 NOTE — Progress Notes (Signed)
Patient has been examined by Dr. Katragadda. Vital signs and labs have been reviewed by MD - ANC, Creatinine, LFTs, hemoglobin, and platelets are within treatment parameters per M.D. - pt may proceed with treatment.  Primary RN and pharmacy notified.  

## 2023-07-20 NOTE — Progress Notes (Signed)
Patient presents today for Keytruda infusion per providers order.  Vital signs and labs reviewed by MD.  Message received from Chapman Moss RN/Dr. Ellin Saba patient okay for treatment.  Stable during infusion without adverse affects.  Vital signs stable.  No complaints at this time.  Discharge from clinic ambulatory in stable condition.  Alert and oriented X 3.  Follow up with [facility] as scheduled.

## 2023-07-20 NOTE — Patient Instructions (Addendum)
[facility] at [facility] Discharge Instructions   You were seen and examined today by Dr. Ellin Saba.  He reviewed the results of your lab work which are normal/stable.   He reviewed the results of your CT scan. It is showing a new small lymph node under the right arm. There is also a new small lymph node in the chest. It is unclear if the lymph node swelling is coming from the CLL you have or the cancer. We will arrange for you to have a biopsy of the lymph node under your right arm to determine where this is coming from.   We will proceed with your treatment today.   Return as scheduled.    Thank you for choosing [facility] at [facility] to provide your oncology and hematology care.  To afford each patient quality time with our provider, please arrive at least 15 minutes before your scheduled appointment time.   If you have a lab appointment with the Cancer Center please come in thru the Main Entrance and check in at the main information desk.  You need to re-schedule your appointment should you arrive 10 or more minutes late.  We strive to give you quality time with our providers, and arriving late affects you and other patients whose appointments are after yours.  Also, if you no show three or more times for appointments you may be dismissed from the clinic at the providers discretion.     Again, thank you for choosing [facility].  Our hope is that these requests will decrease the amount of time that you wait before being seen by our physicians.       _____________________________________________________________  Should you have questions after your visit to [facility], please contact our office at [phone] and follow the prompts.  Our office hours are 8:00 a.m. and 4:30 p.m. Monday - Friday.  Please note that voicemails left after 4:00 p.m. may not be returned until the following business day.  We are closed  weekends and major holidays.  You do have access to a nurse 24-7, just call the main number to the clinic [phone] and do not press any options, hold on the line and a nurse will answer the phone.    For prescription refill requests, have your pharmacy contact our office and allow 72 hours.    Due to Covid, you will need to wear a mask upon entering the hospital. If you do not have a mask, a mask will be given to you at the Main Entrance upon arrival. For doctor visits, patients may have 1 support person age 13 or older with them. For treatment visits, patients can not have anyone with them due to social distancing guidelines and our immunocompromised population.

## 2023-07-21 NOTE — Progress Notes (Unsigned)
Richarda Overlie, MD  Leodis Rains D; P [facility] Procedure Requests PROCEDURE / BIOPSY REVIEW Date: 07/20/23  Requested Biopsy site: Right axillary lymph node Reason for request: Evaluate for disease progression.  Squamous cell vs CLL Imaging review: CT Chest 10/4  Decision: Approved Imaging modality to perform: Ultrasound Schedule with: Patient preference (Local vs Mod Sed) Schedule for: Any VIR  Additional comments:   Please contact me with questions, concerns, or if issue pertaining to this request arise.  Arn Medal, MD [facility]

## 2023-08-04 ENCOUNTER — Ambulatory Visit (HOSPITAL_COMMUNITY)
Admission: RE | Admit: 2023-08-04 | Discharge: 2023-08-04 | Disposition: A | Discharge status: Home / Self Care | Payer: Medicare Other | Source: Ambulatory Visit | Attending: Hematology | Admitting: Hematology

## 2023-08-04 DIAGNOSIS — C4442 Squamous cell carcinoma of skin of scalp and neck: Secondary | ICD-10-CM

## 2023-08-04 DIAGNOSIS — R590 Localized enlarged lymph nodes: Secondary | ICD-10-CM | POA: Diagnosis not present

## 2023-08-04 DIAGNOSIS — C9110 Chronic lymphocytic leukemia of B-cell type not having achieved remission: Secondary | ICD-10-CM

## 2023-08-04 DIAGNOSIS — C773 Secondary and unspecified malignant neoplasm of axilla and upper limb lymph nodes: Secondary | ICD-10-CM | POA: Diagnosis not present

## 2023-08-04 DIAGNOSIS — R599 Enlarged lymph nodes, unspecified: Secondary | ICD-10-CM | POA: Diagnosis not present

## 2023-08-04 DIAGNOSIS — C801 Malignant (primary) neoplasm, unspecified: Secondary | ICD-10-CM | POA: Diagnosis not present

## 2023-08-04 MED ORDER — LIDOCAINE HCL (PF) 1 % IJ SOLN
8.0000 mL | Freq: Once | INTRAMUSCULAR | Status: AC
  Administered 2023-08-04: 8 mL via INTRADERMAL

## 2023-08-04 NOTE — Procedures (Signed)
Interventional Radiology Procedure:   Indications: Enlarged lymph nodes.  History of squamous cell carcinoma.   Procedure: US guided biopsy of right axillary lymph node  Findings: Core biopsies from enlarged right axillary node  Complications: None     EBL: Minimal  Plan: Discharge to home.  [name] R. Lowella Dandy, MD  Pager: [phone]

## 2023-08-06 ENCOUNTER — Encounter: Payer: Self-pay | Admitting: Family Medicine

## 2023-08-06 ENCOUNTER — Ambulatory Visit (INDEPENDENT_AMBULATORY_CARE_PROVIDER_SITE_OTHER): Payer: Medicare Other | Admitting: Family Medicine

## 2023-08-06 VITALS — BP 130/74 | HR 78 | Ht 66.5 in | Wt 186.0 lb

## 2023-08-06 DIAGNOSIS — I4819 Other persistent atrial fibrillation: Secondary | ICD-10-CM

## 2023-08-06 DIAGNOSIS — Z7901 Long term (current) use of anticoagulants: Secondary | ICD-10-CM

## 2023-08-06 LAB — COAGUCHEK XS/INR WAIVED
INR: 2.9 — ABNORMAL HIGH (ref 0.9–1.1)
Prothrombin Time: 35.4 s

## 2023-08-06 NOTE — Progress Notes (Signed)
BP 130/74   Pulse 78   Ht 5' 6.5" (1.689 m)   Wt 186 lb (84.4 kg)   SpO2 95%   BMI 29.57 kg/m    Subjective:   Patient ID: [name], male    DOB: [birth year], 89 y.o.   MRN: [id number]  HPI: [name] is a 89 y.o. male presenting on 08/06/2023 for Medical Management of Chronic Issues and Atrial Fibrillation (6w INR f/u)   HPI Coumadin recheck Target goal: 2.0-3.0 Reason on anticoagulation: A-fib Patient denies any bruising or bleeding or chest pain or palpitations   Relevant past medical, surgical, family and social history reviewed and updated as indicated. Interim medical history since our last visit reviewed. Allergies and medications reviewed and updated.  Review of Systems  Constitutional:  Positive for fatigue. Negative for chills and fever.  Respiratory:  Negative for shortness of breath and wheezing.   Cardiovascular:  Negative for chest pain and leg swelling.  Musculoskeletal:  Negative for back pain and gait problem.  Skin:  Negative for rash.  All other systems reviewed and are negative.   Per HPI unless specifically indicated above   Allergies as of 08/06/2023       Reactions   Penicillins Hives, Rash   Has patient had a PCN reaction causing immediate rash, facial/tongue/throat swelling, SOB or lightheadedness with hypotension: Yes Has patient had a PCN reaction causing severe rash involving mucus membranes or skin necrosis: Yes Has patient had a PCN reaction that required hospitalization: No Has patient had a PCN reaction occurring within the last 10 years: No If all of the above answers are "NO", then may proceed with Cephalosporin use.   Hct [hydrochlorothiazide] Other (See Comments)   hyper   Statins Other (See Comments)   Myalgia, tolerates low dosages of lovastatin         Medication List        Accurate as of August 06, 2023 10:54 AM. If you have any questions, ask your nurse or doctor.          acetaminophen 500 MG  tablet Commonly known as: TYLENOL Take 1,000 mg by mouth every 6 (six) hours as needed for moderate pain.   diltiazem 120 MG 24 hr capsule Commonly known as: CARDIZEM CD TAKE ONE (1) CAPSULE EACH DAY   ezetimibe 10 MG tablet Commonly known as: ZETIA Take 1 tablet (10 mg total) by mouth daily.   finasteride 5 MG tablet Commonly known as: PROSCAR TAKE ONE (1) TABLET EACH DAY   fluticasone 50 MCG/ACT nasal spray Commonly known as: FLONASE Place 1 spray into both nostrils daily as needed for allergies or rhinitis.   furosemide 20 MG tablet Commonly known as: LASIX Take 1 tablet (20 mg total) by mouth daily.   lovastatin 40 MG tablet Commonly known as: MEVACOR Take 1 tablet (40 mg total) by mouth at bedtime.   methylphenidate 5 MG tablet Commonly known as: Ritalin Take 1 tablet (5 mg total) by mouth daily as needed.   metoprolol tartrate 50 MG tablet Commonly known as: LOPRESSOR Take 1 tablet (50 mg total) by mouth 2 (two) times daily.   montelukast 10 MG tablet Commonly known as: SINGULAIR Take 1 tablet (10 mg total) by mouth at bedtime.   PEMBROLIZUMAB IV Inject into the vein every 21 ( twenty-one) days.   Vitamin D3 125 MCG (5000 UT) Caps Take 5,000 Units by mouth once a week.   warfarin 5 MG tablet Commonly known as: COUMADIN  Take as directed by the anticoagulation clinic. If you are unsure how to take this medication, talk to your nurse or doctor. Original instructions: TAKE 1 TABLET ON SAT, SUN, TUES, & THURSTAKE 1/2 TABLET ON MON, WED, & FRIDAY         Objective:   BP 130/74   Pulse 78   Ht 5' 6.5" (1.689 m)   Wt 186 lb (84.4 kg)   SpO2 95%   BMI 29.57 kg/m   Wt Readings from Last 3 Encounters:  08/06/23 186 lb (84.4 kg)  07/20/23 186 lb 12.8 oz (84.7 kg)  06/29/23 188 lb 3.2 oz (85.4 kg)    Physical Exam Vitals and nursing note reviewed.  Constitutional:      Appearance: Normal appearance.  HENT:     Right Ear: Tympanic membrane normal.      Left Ear: Tympanic membrane normal.     Mouth/Throat:     Mouth: Mucous membranes are moist.     Pharynx: Oropharynx is clear. No oropharyngeal exudate or posterior oropharyngeal erythema.  Neurological:     Mental Status: He is alert.       Assessment & Plan:   Problem List Items Addressed This Visit       Cardiovascular and Mediastinum   Atrial fibrillation, persistent (HCC) - Primary   Relevant Orders   CoaguChek XS/INR Waived     Other   Long term current use of anticoagulant therapy    Description   Continue Thursdays and Saturdays take 1/2 tablet, Take 1 tablet all other days.   INR today is  2.9 (goal is 2-3)    Recheck in 4-6 weeks       Follow up plan: Return if symptoms worsen or fail to improve, for 6-week INR recheck.  Counseling provided for all of the vaccine components Orders Placed This Encounter  Procedures   CoaguChek XS/INR Waived    Arville Care, MD [facility] Family Medicine 08/06/2023, 10:54 AM

## 2023-08-09 ENCOUNTER — Inpatient Hospital Stay: Payer: Medicare Other

## 2023-08-09 VITALS — BP 128/81 | HR 80 | Temp 97.0°F | Resp 18 | Wt 188.0 lb

## 2023-08-09 DIAGNOSIS — Z87891 Personal history of nicotine dependence: Secondary | ICD-10-CM | POA: Diagnosis not present

## 2023-08-09 DIAGNOSIS — C760 Malignant neoplasm of head, face and neck: Secondary | ICD-10-CM | POA: Diagnosis not present

## 2023-08-09 DIAGNOSIS — D72820 Lymphocytosis (symptomatic): Secondary | ICD-10-CM | POA: Diagnosis not present

## 2023-08-09 DIAGNOSIS — C4442 Squamous cell carcinoma of skin of scalp and neck: Secondary | ICD-10-CM

## 2023-08-09 DIAGNOSIS — R918 Other nonspecific abnormal finding of lung field: Secondary | ICD-10-CM | POA: Diagnosis not present

## 2023-08-09 DIAGNOSIS — Z5112 Encounter for antineoplastic immunotherapy: Secondary | ICD-10-CM | POA: Diagnosis not present

## 2023-08-09 DIAGNOSIS — Z95828 Presence of other vascular implants and grafts: Secondary | ICD-10-CM

## 2023-08-09 DIAGNOSIS — Z79620 Long term (current) use of immunosuppressive biologic: Secondary | ICD-10-CM | POA: Diagnosis not present

## 2023-08-09 DIAGNOSIS — Z801 Family history of malignant neoplasm of trachea, bronchus and lung: Secondary | ICD-10-CM | POA: Diagnosis not present

## 2023-08-09 DIAGNOSIS — R530 Neoplastic (malignant) related fatigue: Secondary | ICD-10-CM | POA: Diagnosis not present

## 2023-08-09 LAB — CBC WITH DIFFERENTIAL/PLATELET
Abs Immature Granulocytes: 0.07 10*3/uL (ref 0.00–0.07)
Basophils Absolute: 0.1 10*3/uL (ref 0.0–0.1)
Basophils Relative: 1 %
Eosinophils Absolute: 1 10*3/uL — ABNORMAL HIGH (ref 0.0–0.5)
Eosinophils Relative: 8 %
HCT: 39.5 % (ref 39.0–52.0)
Hemoglobin: 12.8 g/dL — ABNORMAL LOW (ref 13.0–17.0)
Immature Granulocytes: 1 %
Lymphocytes Relative: 30 %
Lymphs Abs: 4 10*3/uL (ref 0.7–4.0)
MCH: 33.5 pg (ref 26.0–34.0)
MCHC: 32.4 g/dL (ref 30.0–36.0)
MCV: 103.4 fL — ABNORMAL HIGH (ref 80.0–100.0)
Monocytes Absolute: 0.8 10*3/uL (ref 0.1–1.0)
Monocytes Relative: 6 %
Neutro Abs: 7.2 10*3/uL (ref 1.7–7.7)
Neutrophils Relative %: 54 %
Platelets: 269 10*3/uL (ref 150–400)
RBC: 3.82 MIL/uL — ABNORMAL LOW (ref 4.22–5.81)
RDW: 12.7 % (ref 11.5–15.5)
WBC: 13.1 10*3/uL — ABNORMAL HIGH (ref 4.0–10.5)
nRBC: 0 % (ref 0.0–0.2)

## 2023-08-09 LAB — COMPREHENSIVE METABOLIC PANEL
ALT: 14 U/L (ref 0–44)
AST: 15 U/L (ref 15–41)
Albumin: 3.5 g/dL (ref 3.5–5.0)
Alkaline Phosphatase: 59 U/L (ref 38–126)
Anion gap: 9 (ref 5–15)
BUN: 18 mg/dL (ref 8–23)
CO2: 27 mmol/L (ref 22–32)
Calcium: 8.8 mg/dL — ABNORMAL LOW (ref 8.9–10.3)
Chloride: 103 mmol/L (ref 98–111)
Creatinine, Ser: 1.24 mg/dL (ref 0.61–1.24)
GFR, Estimated: 56 mL/min — ABNORMAL LOW (ref >60)
Glucose, Bld: 138 mg/dL — ABNORMAL HIGH (ref 70–99)
Potassium: 3.6 mmol/L (ref 3.5–5.1)
Sodium: 139 mmol/L (ref 135–145)
Total Bilirubin: 0.4 mg/dL (ref 0.3–1.2)
Total Protein: 7 g/dL (ref 6.5–8.1)

## 2023-08-09 LAB — TSH: TSH: 1.445 u[IU]/mL (ref 0.350–4.500)

## 2023-08-09 LAB — MAGNESIUM: Magnesium: 2 mg/dL (ref 1.7–2.4)

## 2023-08-09 MED ORDER — HEPARIN SOD (PORK) LOCK FLUSH 100 UNIT/ML IV SOLN
500.0000 [IU] | Freq: Once | INTRAVENOUS | Status: AC | PRN
  Administered 2023-08-09: 500 [IU]

## 2023-08-09 MED ORDER — SODIUM CHLORIDE 0.9 % IV SOLN: Freq: Once | INTRAVENOUS | Status: AC

## 2023-08-09 MED ORDER — SODIUM CHLORIDE 0.9% FLUSH
10.0000 mL | Freq: Once | INTRAVENOUS | Status: AC
  Administered 2023-08-09: 10 mL via INTRAVENOUS

## 2023-08-09 MED ORDER — SODIUM CHLORIDE 0.9 % IV SOLN
200.0000 mg | Freq: Once | INTRAVENOUS | Status: AC
  Administered 2023-08-09: 200 mg via INTRAVENOUS
  Filled 2023-08-09: qty 8

## 2023-08-09 MED ORDER — SODIUM CHLORIDE 0.9% FLUSH
10.0000 mL | INTRAVENOUS | Status: DC | PRN
  Administered 2023-08-09: 10 mL

## 2023-08-09 NOTE — Patient Instructions (Signed)
[facility]  Discharge Instructions: Thank you for choosing [facility] to provide your oncology and hematology care.  If you have a lab appointment with the Cancer Center - please note that after April 8th, 2024, all labs will be drawn in the cancer center.  You do not have to check in or register with the main entrance as you have in the past but will complete your check-in in the cancer center.  Wear comfortable clothing and clothing appropriate for easy access to any Portacath or PICC line.   We strive to give you quality time with your provider. You may need to reschedule your appointment if you arrive late (15 or more minutes).  Arriving late affects you and other patients whose appointments are after yours.  Also, if you miss three or more appointments without notifying the office, you may be dismissed from the clinic at the provider's discretion.      For prescription refill requests, have your pharmacy contact our office and allow 72 hours for refills to be completed.    Today you received the following Keytruda, return as scheduled.   To help prevent nausea and vomiting after your treatment, we encourage you to take your nausea medication as directed.  BELOW ARE SYMPTOMS THAT SHOULD BE REPORTED IMMEDIATELY: *FEVER GREATER THAN 100.4 F (38 C) OR HIGHER *CHILLS OR SWEATING *NAUSEA AND VOMITING THAT IS NOT CONTROLLED WITH YOUR NAUSEA MEDICATION *UNUSUAL SHORTNESS OF BREATH *UNUSUAL BRUISING OR BLEEDING *URINARY PROBLEMS (pain or burning when urinating, or frequent urination) *BOWEL PROBLEMS (unusual diarrhea, constipation, pain near the anus) TENDERNESS IN MOUTH AND THROAT WITH OR WITHOUT PRESENCE OF ULCERS (sore throat, sores in mouth, or a toothache) UNUSUAL RASH, SWELLING OR PAIN  UNUSUAL VAGINAL DISCHARGE OR ITCHING   Items with * indicate a potential emergency and should be followed up as soon as possible or go to the Emergency Department if  any problems should occur.  Please show the CHEMOTHERAPY ALERT CARD or IMMUNOTHERAPY ALERT CARD at check-in to the Emergency Department and triage nurse.  Should you have questions after your visit or need to cancel or reschedule your appointment, please contact [facility] [phone]  and follow the prompts.  Office hours are 8:00 a.m. to 4:30 p.m. Monday - Friday. Please note that voicemails left after 4:00 p.m. may not be returned until the following business day.  We are closed weekends and major holidays. You have access to a nurse at all times for urgent questions. Please call the main number to the clinic [phone] and follow the prompts.  For any non-urgent questions, you may also contact your provider using MyChart. We now offer e-Visits for anyone 45 and older to request care online for non-urgent symptoms. For details visit [email or web address].   Also download the MyChart app! Go to the app store, search "MyChart", open the app, select [place], and log in with your MyChart username and password.

## 2023-08-09 NOTE — Progress Notes (Signed)
Patient presents today for [facility], labs are within treatment parameters. Patient tolerated therapy with no complaints voiced. Side effects with management reviewed with understanding verbalized. Port site clean and dry with no bruising or swelling noted at site. Good blood return noted before and after administration of therapy. Band aid applied. Patient left in satisfactory condition with VSS and no s/s of distress noted.

## 2023-08-18 ENCOUNTER — Other Ambulatory Visit: Payer: Self-pay | Admitting: Hematology

## 2023-08-30 ENCOUNTER — Inpatient Hospital Stay: Payer: Medicare Other | Admitting: Hematology

## 2023-08-30 ENCOUNTER — Inpatient Hospital Stay: Payer: Medicare Other

## 2023-08-30 ENCOUNTER — Inpatient Hospital Stay: Payer: Medicare Other | Attending: Hematology

## 2023-08-30 VITALS — BP 128/79 | HR 64 | Temp 97.6°F | Resp 16

## 2023-08-30 DIAGNOSIS — Z79620 Long term (current) use of immunosuppressive biologic: Secondary | ICD-10-CM | POA: Insufficient documentation

## 2023-08-30 DIAGNOSIS — C801 Malignant (primary) neoplasm, unspecified: Secondary | ICD-10-CM | POA: Insufficient documentation

## 2023-08-30 DIAGNOSIS — C778 Secondary and unspecified malignant neoplasm of lymph nodes of multiple regions: Secondary | ICD-10-CM | POA: Insufficient documentation

## 2023-08-30 DIAGNOSIS — C4442 Squamous cell carcinoma of skin of scalp and neck: Secondary | ICD-10-CM

## 2023-08-30 DIAGNOSIS — Z5112 Encounter for antineoplastic immunotherapy: Secondary | ICD-10-CM | POA: Diagnosis not present

## 2023-08-30 DIAGNOSIS — Z95828 Presence of other vascular implants and grafts: Secondary | ICD-10-CM

## 2023-08-30 LAB — CBC WITH DIFFERENTIAL/PLATELET
Abs Immature Granulocytes: 0.07 10*3/uL (ref 0.00–0.07)
Basophils Absolute: 0.1 10*3/uL (ref 0.0–0.1)
Basophils Relative: 1 %
Eosinophils Absolute: 1.2 10*3/uL — ABNORMAL HIGH (ref 0.0–0.5)
Eosinophils Relative: 8 %
HCT: 40 % (ref 39.0–52.0)
Hemoglobin: 12.9 g/dL — ABNORMAL LOW (ref 13.0–17.0)
Immature Granulocytes: 1 %
Lymphocytes Relative: 32 %
Lymphs Abs: 4.8 10*3/uL — ABNORMAL HIGH (ref 0.7–4.0)
MCH: 33 pg (ref 26.0–34.0)
MCHC: 32.3 g/dL (ref 30.0–36.0)
MCV: 102.3 fL — ABNORMAL HIGH (ref 80.0–100.0)
Monocytes Absolute: 0.9 10*3/uL (ref 0.1–1.0)
Monocytes Relative: 6 %
Neutro Abs: 7.7 10*3/uL (ref 1.7–7.7)
Neutrophils Relative %: 52 %
Platelets: 205 10*3/uL (ref 150–400)
RBC: 3.91 MIL/uL — ABNORMAL LOW (ref 4.22–5.81)
RDW: 13.1 % (ref 11.5–15.5)
WBC: 14.7 10*3/uL — ABNORMAL HIGH (ref 4.0–10.5)
nRBC: 0 % (ref 0.0–0.2)

## 2023-08-30 LAB — COMPREHENSIVE METABOLIC PANEL
ALT: 13 U/L (ref 0–44)
AST: 17 U/L (ref 15–41)
Albumin: 3.6 g/dL (ref 3.5–5.0)
Alkaline Phosphatase: 59 U/L (ref 38–126)
Anion gap: 7 (ref 5–15)
BUN: 19 mg/dL (ref 8–23)
CO2: 28 mmol/L (ref 22–32)
Calcium: 9 mg/dL (ref 8.9–10.3)
Chloride: 105 mmol/L (ref 98–111)
Creatinine, Ser: 1.09 mg/dL (ref 0.61–1.24)
GFR, Estimated: >60 mL/min (ref >60)
Glucose, Bld: 117 mg/dL — ABNORMAL HIGH (ref 70–99)
Potassium: 3.6 mmol/L (ref 3.5–5.1)
Sodium: 140 mmol/L (ref 135–145)
Total Bilirubin: 0.6 mg/dL (ref <1.2)
Total Protein: 7.1 g/dL (ref 6.5–8.1)

## 2023-08-30 LAB — TSH: TSH: 1.268 u[IU]/mL (ref 0.350–4.500)

## 2023-08-30 LAB — MAGNESIUM: Magnesium: 2.1 mg/dL (ref 1.7–2.4)

## 2023-08-30 MED ORDER — SODIUM CHLORIDE 0.9 % IV SOLN: Freq: Once | INTRAVENOUS | Status: AC

## 2023-08-30 MED ORDER — SODIUM CHLORIDE 0.9 % IV SOLN
200.0000 mg | Freq: Once | INTRAVENOUS | Status: AC
  Administered 2023-08-30: 200 mg via INTRAVENOUS
  Filled 2023-08-30: qty 8

## 2023-08-30 MED ORDER — HEPARIN SOD (PORK) LOCK FLUSH 100 UNIT/ML IV SOLN
500.0000 [IU] | Freq: Once | INTRAVENOUS | Status: AC | PRN
  Administered 2023-08-30: 500 [IU]

## 2023-08-30 MED ORDER — SODIUM CHLORIDE 0.9% FLUSH
10.0000 mL | Freq: Once | INTRAVENOUS | Status: AC
  Administered 2023-08-30: 10 mL via INTRAVENOUS

## 2023-08-30 NOTE — Progress Notes (Deleted)
CRITICAL VALUE ALERT Critical value received:  2.6 Potassium. Date of notification:  08-30-2023 Time of notification: *** Critical value read back:  {yes no:314532} Nurse who received alert:  *** MD notified time and response:  ***

## 2023-08-30 NOTE — Progress Notes (Signed)

## 2023-08-30 NOTE — Patient Instructions (Signed)
 [place] [facility] - A DEPT OF [facility]  Discharge Instructions: Thank you for choosing [facility] to provide your oncology and hematology care.  If you have a lab appointment with the Cancer Center - please note that after April 8th, 2024, all labs will be drawn in the cancer center.  You do not have to check in or register with the main entrance as you have in the past but will complete your check-in in the cancer center.  Wear comfortable clothing and clothing appropriate for easy access to any Portacath or PICC line.   We strive to give you quality time with your provider. You may need to reschedule your appointment if you arrive late (15 or more minutes).  Arriving late affects you and other patients whose appointments are after yours.  Also, if you miss three or more appointments without notifying the office, you may be dismissed from the clinic at the provider's discretion.      For prescription refill requests, have your pharmacy contact our office and allow 72 hours for refills to be completed.    Today you received the following chemotherapy and/or immunotherapy agents Keytruda      To help prevent nausea and vomiting after your treatment, we encourage you to take your nausea medication as directed.  BELOW ARE SYMPTOMS THAT SHOULD BE REPORTED IMMEDIATELY: *FEVER GREATER THAN 100.4 F (38 C) OR HIGHER *CHILLS OR SWEATING *NAUSEA AND VOMITING THAT IS NOT CONTROLLED WITH YOUR NAUSEA MEDICATION *UNUSUAL SHORTNESS OF BREATH *UNUSUAL BRUISING OR BLEEDING *URINARY PROBLEMS (pain or burning when urinating, or frequent urination) *BOWEL PROBLEMS (unusual diarrhea, constipation, pain near the anus) TENDERNESS IN MOUTH AND THROAT WITH OR WITHOUT PRESENCE OF ULCERS (sore throat, sores in mouth, or a toothache) UNUSUAL RASH, SWELLING OR PAIN  UNUSUAL VAGINAL DISCHARGE OR ITCHING   Items with * indicate a potential emergency and should be followed up  as soon as possible or go to the Emergency Department if any problems should occur.  Please show the CHEMOTHERAPY ALERT CARD or IMMUNOTHERAPY ALERT CARD at check-in to the Emergency Department and triage nurse.  Should you have questions after your visit or need to cancel or reschedule your appointment, please contact [place] [facility] - A DEPT OF [facility] [phone]  and follow the prompts.  Office hours are 8:00 a.m. to 4:30 p.m. Monday - Friday. Please note that voicemails left after 4:00 p.m. may not be returned until the following business day.  We are closed weekends and major holidays. You have access to a nurse at all times for urgent questions. Please call the main number to the clinic [phone] and follow the prompts.  For any non-urgent questions, you may also contact your provider using MyChart. We now offer e-Visits for anyone 42 and older to request care online for non-urgent symptoms. For details visit [email or web address].   Also download the MyChart app! Go to the app store, search "MyChart", open the app, select [place], and log in with your MyChart username and password.

## 2023-08-31 LAB — T4: T4, Total: 6.9 ug/dL (ref 4.5–12.0)

## 2023-09-02 ENCOUNTER — Ambulatory Visit: Payer: Medicare Other | Admitting: Family Medicine

## 2023-09-02 ENCOUNTER — Encounter: Payer: Self-pay | Admitting: Family Medicine

## 2023-09-02 VITALS — BP 150/86 | HR 93 | Ht 66.5 in | Wt 187.0 lb

## 2023-09-02 DIAGNOSIS — I10 Essential (primary) hypertension: Secondary | ICD-10-CM

## 2023-09-02 DIAGNOSIS — Z7901 Long term (current) use of anticoagulants: Secondary | ICD-10-CM | POA: Diagnosis not present

## 2023-09-02 DIAGNOSIS — R7303 Prediabetes: Secondary | ICD-10-CM | POA: Diagnosis not present

## 2023-09-02 DIAGNOSIS — I4819 Other persistent atrial fibrillation: Secondary | ICD-10-CM

## 2023-09-02 DIAGNOSIS — E785 Hyperlipidemia, unspecified: Secondary | ICD-10-CM

## 2023-09-02 LAB — COAGUCHEK XS/INR WAIVED
INR: 5.3 (ref 0.9–1.1)
Prothrombin Time: 64.1 s

## 2023-09-02 NOTE — Progress Notes (Signed)
BP (!) 150/86   Pulse 93   Ht 5' 6.5" (1.689 m)   Wt 187 lb (84.8 kg)   SpO2 95%   BMI 29.73 kg/m    Subjective:   Patient ID: [name], male    DOB: [birth year], 89 y.o.   MRN: [id number]  HPI: KAULIN PARROTTE is a 89 y.o. male presenting on 09/02/2023 for Medical Management of Chronic Issues, Atrial Fibrillation, Hypertension, and Hyperlipidemia   HPI Hyperlipidemia and coronary arthrosclerosis Patient is coming in for recheck of his hyperlipidemia. The patient is currently taking lovastatin and Zetia. They deny any issues with myalgias or history of liver damage from it. They deny any focal numbness or weakness or chest pain.   Hypertension Patient is currently on metoprolol and diltiazem, and their blood pressure today is 150/86 but he says he gets better numbers at home in the 130s over 80s.. Patient denies any lightheadedness or dizziness. Patient denies headaches, blurred vision, chest pains, shortness of breath, or weakness. Denies any side effects from medication and is content with current medication.   Coumadin recheck Target goal: 2.0-3.0 Reason on anticoagulation: Persistent A-fib Patient denies any bruising or bleeding or chest pain or palpitations   Relevant past medical, surgical, family and social history reviewed and updated as indicated. Interim medical history since our last visit reviewed. Allergies and medications reviewed and updated.  Review of Systems  Constitutional:  Negative for chills and fever.  Eyes:  Negative for visual disturbance.  Respiratory:  Negative for shortness of breath and wheezing.   Cardiovascular:  Negative for chest pain and leg swelling.  Gastrointestinal:  Negative for anal bleeding and blood in stool.  Genitourinary:  Negative for hematuria.  Musculoskeletal:  Negative for back pain and gait problem.  Skin:  Negative for rash.  Neurological:  Negative for dizziness, weakness and light-headedness.  All other systems reviewed  and are negative.   Per HPI unless specifically indicated above   Allergies as of 09/02/2023       Reactions   Penicillins Hives, Rash   Has patient had a PCN reaction causing immediate rash, facial/tongue/throat swelling, SOB or lightheadedness with hypotension: Yes Has patient had a PCN reaction causing severe rash involving mucus membranes or skin necrosis: Yes Has patient had a PCN reaction that required hospitalization: No Has patient had a PCN reaction occurring within the last 10 years: No If all of the above answers are "NO", then may proceed with Cephalosporin use.   Hct [hydrochlorothiazide] Other (See Comments)   hyper   Statins Other (See Comments)   Myalgia, tolerates low dosages of lovastatin         Medication List        Accurate as of September 02, 2023 10:06 AM. If you have any questions, ask your nurse or doctor.          acetaminophen 500 MG tablet Commonly known as: TYLENOL Take 1,000 mg by mouth every 6 (six) hours as needed for moderate pain.   diltiazem 120 MG 24 hr capsule Commonly known as: CARDIZEM CD TAKE ONE (1) CAPSULE EACH DAY   ezetimibe 10 MG tablet Commonly known as: ZETIA Take 1 tablet (10 mg total) by mouth daily.   finasteride 5 MG tablet Commonly known as: PROSCAR TAKE ONE (1) TABLET EACH DAY   fluticasone 50 MCG/ACT nasal spray Commonly known as: FLONASE Place 1 spray into both nostrils daily as needed for allergies or rhinitis.   furosemide 20 MG  tablet Commonly known as: LASIX Take 1 tablet (20 mg total) by mouth daily.   lovastatin 40 MG tablet Commonly known as: MEVACOR Take 1 tablet (40 mg total) by mouth at bedtime.   methylphenidate 5 MG tablet Commonly known as: RITALIN TAKE 1 TABLET BY MOUTH DAILY AS NEEDED   metoprolol tartrate 50 MG tablet Commonly known as: LOPRESSOR Take 1 tablet (50 mg total) by mouth 2 (two) times daily.   montelukast 10 MG tablet Commonly known as: SINGULAIR Take 1 tablet (10  mg total) by mouth at bedtime.   PEMBROLIZUMAB IV Inject into the vein every 21 ( twenty-one) days.   Vitamin D3 125 MCG (5000 UT) Caps Take 5,000 Units by mouth once a week.   warfarin 5 MG tablet Commonly known as: COUMADIN Take as directed by the anticoagulation clinic. If you are unsure how to take this medication, talk to your nurse or doctor. Original instructions: TAKE 1 TABLET ON SAT, SUN, TUES, & THURSTAKE 1/2 TABLET ON MON, WED, & FRIDAY         Objective:   BP (!) 150/86   Pulse 93   Ht 5' 6.5" (1.689 m)   Wt 187 lb (84.8 kg)   SpO2 95%   BMI 29.73 kg/m   Wt Readings from Last 3 Encounters:  09/02/23 187 lb (84.8 kg)  08/09/23 188 lb (85.3 kg)  08/06/23 186 lb (84.4 kg)    Physical Exam Vitals and nursing note reviewed.  Constitutional:      General: He is not in acute distress.    Appearance: He is well-developed. He is not diaphoretic.  Eyes:     General: No scleral icterus.    Conjunctiva/sclera: Conjunctivae normal.  Neck:     Thyroid: No thyromegaly.  Cardiovascular:     Rate and Rhythm: Normal rate and regular rhythm.     Heart sounds: Normal heart sounds. No murmur heard. Pulmonary:     Effort: Pulmonary effort is normal. No respiratory distress.     Breath sounds: Normal breath sounds. No wheezing.  Musculoskeletal:        General: No swelling. Normal range of motion.     Cervical back: Neck supple.  Lymphadenopathy:     Cervical: No cervical adenopathy.  Skin:    General: Skin is warm and dry.     Findings: No rash.  Neurological:     Mental Status: He is alert and oriented to person, place, and time.     Coordination: Coordination normal.  Psychiatric:        Behavior: Behavior normal.       Assessment & Plan:   Problem List Items Addressed This Visit       Cardiovascular and Mediastinum   Essential hypertension   Atrial fibrillation, persistent (HCC) - Primary   Relevant Orders   CoaguChek XS/INR Waived     Other    Dyslipidemia   Relevant Orders   Lipid panel   Prediabetes   Long term current use of anticoagulant therapy    Description   Hold for 2 days and then decrease dose to take on Mondays, Thursdays and Saturdays take 1/2 tablet, Take 1 tablet all other days.   INR today is  5.3 (goal is 2-3)    Recheck in 2-3 weeks      Continue current medicine, continue to monitor blood pressure.  No changes in regards to that.  He did have a chemotherapy treatment on Monday just a few days ago and  that may have been what affected his Coumadin levels. Follow up plan: Return if symptoms worsen or fail to improve, for 2 to 3-week INR and 25-month dyslipidemia recheck.  Counseling provided for all of the vaccine components Orders Placed This Encounter  Procedures   Lipid panel   CoaguChek XS/INR Waived    Arville Care, MD [facility] Family Medicine 09/02/2023, 10:06 AM

## 2023-09-03 ENCOUNTER — Other Ambulatory Visit: Payer: Self-pay

## 2023-09-03 LAB — LIPID PANEL
Chol/HDL Ratio: 3.2 ratio (ref 0.0–5.0)
Cholesterol, Total: 153 mg/dL (ref 100–199)
HDL: 48 mg/dL (ref >39)
LDL Chol Calc (NIH): 85 mg/dL (ref 0–99)
Triglycerides: 112 mg/dL (ref 0–149)
VLDL Cholesterol Cal: 20 mg/dL (ref 5–40)

## 2023-09-16 ENCOUNTER — Encounter: Payer: Self-pay | Admitting: Family Medicine

## 2023-09-16 ENCOUNTER — Ambulatory Visit: Payer: Medicare Other | Admitting: Family Medicine

## 2023-09-16 VITALS — BP 136/68 | HR 67 | Ht 66.5 in | Wt 186.0 lb

## 2023-09-16 DIAGNOSIS — Z7901 Long term (current) use of anticoagulants: Secondary | ICD-10-CM

## 2023-09-16 DIAGNOSIS — I4819 Other persistent atrial fibrillation: Secondary | ICD-10-CM

## 2023-09-16 LAB — COAGUCHEK XS/INR WAIVED
INR: 3.8 — ABNORMAL HIGH (ref 0.9–1.1)
Prothrombin Time: 45.6 s

## 2023-09-16 NOTE — Progress Notes (Signed)
BP 136/68   Pulse 67   Ht 5' 6.5" (1.689 m)   Wt 186 lb (84.4 kg)   SpO2 93%   BMI 29.57 kg/m    Subjective:   Patient ID: [name], male    DOB: 07-03-1932, 89 y.o.   MRN: [id number]  HPI: [name] is a 89 y.o. male presenting on 09/16/2023 for Medical Management of Chronic Issues and Atrial Fibrillation (2w INR F/U)   HPI Coumadin recheck Target goal: 2.0-3.0 Reason on anticoagulation: A-fib Patient denies any bruising or bleeding or chest pain or palpitations   Relevant past medical, surgical, family and social history reviewed and updated as indicated. Interim medical history since our last visit reviewed. Allergies and medications reviewed and updated.  Review of Systems  Constitutional:  Negative for chills and fever.  Respiratory:  Negative for shortness of breath and wheezing.   Cardiovascular:  Negative for chest pain and leg swelling.  Gastrointestinal:  Negative for anal bleeding and blood in stool.  Genitourinary:  Negative for hematuria.  Musculoskeletal:  Negative for back pain and gait problem.  Skin:  Negative for rash.  All other systems reviewed and are negative.   Per HPI unless specifically indicated above   Allergies as of 09/16/2023       Reactions   Penicillins Hives, Rash   Has patient had a PCN reaction causing immediate rash, facial/tongue/throat swelling, SOB or lightheadedness with hypotension: Yes Has patient had a PCN reaction causing severe rash involving mucus membranes or skin necrosis: Yes Has patient had a PCN reaction that required hospitalization: No Has patient had a PCN reaction occurring within the last 10 years: No If all of the above answers are "NO", then may proceed with Cephalosporin use.   Hct [hydrochlorothiazide] Other (See Comments)   hyper   Statins Other (See Comments)   Myalgia, tolerates low dosages of lovastatin         Medication List        Accurate as of September 16, 2023  9:31 AM. If you have  any questions, ask your nurse or doctor.          acetaminophen 500 MG tablet Commonly known as: TYLENOL Take 1,000 mg by mouth every 6 (six) hours as needed for moderate pain.   diltiazem 120 MG 24 hr capsule Commonly known as: CARDIZEM CD TAKE ONE (1) CAPSULE EACH DAY   ezetimibe 10 MG tablet Commonly known as: ZETIA Take 1 tablet (10 mg total) by mouth daily.   finasteride 5 MG tablet Commonly known as: PROSCAR TAKE ONE (1) TABLET EACH DAY   fluticasone 50 MCG/ACT nasal spray Commonly known as: FLONASE Place 1 spray into both nostrils daily as needed for allergies or rhinitis.   furosemide 20 MG tablet Commonly known as: LASIX Take 1 tablet (20 mg total) by mouth daily.   lovastatin 40 MG tablet Commonly known as: MEVACOR Take 1 tablet (40 mg total) by mouth at bedtime.   methylphenidate 5 MG tablet Commonly known as: RITALIN TAKE 1 TABLET BY MOUTH DAILY AS NEEDED   metoprolol tartrate 50 MG tablet Commonly known as: LOPRESSOR Take 1 tablet (50 mg total) by mouth 2 (two) times daily.   montelukast 10 MG tablet Commonly known as: SINGULAIR Take 1 tablet (10 mg total) by mouth at bedtime.   PEMBROLIZUMAB IV Inject into the vein every 21 ( twenty-one) days.   Vitamin D3 125 MCG (5000 UT) Caps Take 5,000 Units by mouth once  a week.   warfarin 5 MG tablet Commonly known as: COUMADIN Take as directed by the anticoagulation clinic. If you are unsure how to take this medication, talk to your nurse or doctor. Original instructions: TAKE 1 TABLET ON SAT, SUN, TUES, & THURSTAKE 1/2 TABLET ON MON, WED, & FRIDAY         Objective:   BP 136/68   Pulse 67   Ht 5' 6.5" (1.689 m)   Wt 186 lb (84.4 kg)   SpO2 93%   BMI 29.57 kg/m   Wt Readings from Last 3 Encounters:  09/16/23 186 lb (84.4 kg)  09/02/23 187 lb (84.8 kg)  08/09/23 188 lb (85.3 kg)    Physical Exam Vitals and nursing note reviewed.  Constitutional:      Appearance: Normal appearance.   Skin:    Findings: No bruising, erythema or rash.  Neurological:     Mental Status: He is alert.     Description   Hold for 1 days and then decrease dose to take on Sundays, Tuesdays, Thursdays and Saturdays take 1/2 tablet, Take 1 tablet all other days.   INR today is  3.8 (goal is 2-3)    Recheck in 4-5 weeks       Assessment & Plan:   Problem List Items Addressed This Visit       Cardiovascular and Mediastinum   Atrial fibrillation, persistent (HCC) - Primary   Relevant Orders   CoaguChek XS/INR Waived     Other   Long term current use of anticoagulant therapy     Follow up plan: Return for 4 to 6-week INR.  Counseling provided for all of the vaccine components Orders Placed This Encounter  Procedures   CoaguChek XS/INR Waived    Arville Care, MD [facility] 09/16/2023, 9:31 AM

## 2023-09-26 NOTE — Progress Notes (Signed)
[facility] [address], [place] [address]    Clinic Day:  09/27/2023  Referring physician: [name], Elige Radon, MD  Patient Care Team: [name], Elige Radon, MD as PCP - General (Family Medicine) [name], MD as PCP - Cardiology (Cardiology) Bjorn Pippin, MD as Attending Physician (Urology) [name], MD as Consulting Physician (Cardiology) [name], MD (Inactive) as Consulting Physician (Gastroenterology) [name], MD as Consulting Physician (Ophthalmology) [name], MD as Consulting Physician (Orthopedic Surgery) [name], [name], LCSW as [facility] Care Management (Licensed Clinical Social Worker) [name], MD as Medical Oncologist (Medical Oncology) [name], RN as Oncology Nurse Navigator (Medical Oncology)   ASSESSMENT & PLAN:   Assessment: 1.  Right supraclavicular lymphadenopathy and multiple lung nodules: - Patient noticed right neck mass since Christmas 2022.  10 to 15 pound weight loss since January 2023.  Denies any dysphagia or odynophagia. - CT neck (05/27/2022): Numerous lymph nodes in the right neck, right supraclavicular lymph node mass measures 5.7 x 3.7 cm.  18 mm posterior lymph node in the right lower neck.  Numerous lymph nodes in the right lower and posterior neck approximately 1 cm.  Many have internal necrosis consistent with metastatic disease.  20 mm lymph node just above the right clavicle.  Left level 2 node 12 mm. - CT CAP (05/28/2022): Several bilateral lung nodules, RUL nodule measuring 1.8 x 1.3 cm.  Small clusters of nodules in the medial left upper lobe.  Irregular nodular density along the left side of the mediastinum measuring 2.1 cm.  No metastatic disease in the abdomen or pelvis.  Liver is slightly nodular contour. - Lab work shows elevated white count since 2009, predominantly lymphocytosis. - Pathology (06/09/2022): Metastatic carcinoma positive for p40, p63  and CK5/6-patchy positivity with CK7.  Negative for TTF-1, Napsin A, PAX8, prostein, PSA, CK20 and CDX2.  - PET scan (06/11/2022): Bulky right supraclavicular nodal mass, smaller hypermetabolic right posterior triangle and jugular lymph nodes and single hypermetabolic left level 3 lymph node.  Bilateral hypermetabolic pulmonary nodules and metastatic pattern.  Minimal hypermetabolic mediastinal nodal metastasis.  Cluster of hypermetabolic right axillary lymph nodes.  No evidence of metastatic disease or primary lesion in the abdomen or pelvis.  No bone lesions. - MRI of the brain: Chronic small vessel ischemic changes with no evidence of metastatic disease. - NGS: T p53 pathogenic variant, MSI-stable, TMB-low, LOH-low, PD-L1 (UE454): Negative, p16 and p18 negative.  HER2 by IHC 0. - PD-L1 22 C3: CPS score 25 - Cancer type ID: 96% probability squamous cell carcinoma, subtype head and neck/skin.  Cancer types ruled out with 95% confidence includes skin basal cell carcinoma. - Carboplatin, Taxol and pembrolizumab 07/23/2022 through 11/09/22 - Right axillary lymph node biopsy (08/04/2023): Metastatic moderate to poorly differentiated squamous cell carcinoma.   2.  Social/family history: - He lives at home with his wife.  Independent of ADLs and IADLs.  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  May have had asbestos exposure 30 years ago.  Quit smoking cigarettes in 1965.  Smoked 1 pack/day for less than 12 years.  2 of his brothers had lung cancer and both were smokers.    Plan: 1.  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 - CT scans in October showed mild progression of metastatic disease in the mediastinal  lymph nodes with growth around 3 to 4 mm. - We discussed right axillary lymph node biopsy consistent with squamous cell carcinoma.  Most  likely is progressing.  I will arrange for CT neck, CAP after today's treatment.  He is reluctant to consider any second line chemotherapy.  We will discuss at next visit after scans. - He reports pain in his joints particularly in the neck and shoulder joints for the last couple of months.  These pains get better as the day progresses.  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  Will start him on low-dose prednisone 5 mg daily.   2.  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   3.  Cancer-related fatigue: - Continue Ritalin 5 mg in the mornings 3 times weekly as needed.  It is helping with energy levels.    Orders Placed This Encounter  Procedures   CT CHEST ABDOMEN PELVIS W CONTRAST    Standing Status:   Future    Expected Date:   10/11/2023    Expiration Date:   09/26/2024    If indicated for the ordered procedure, I authorize the administration of contrast media per Radiology protocol:   Yes    Does the patient have a contrast media/X-ray dye allergy?:   No    Preferred imaging location?:   [facility]    If indicated for the ordered procedure, I authorize the administration of oral contrast media per Radiology protocol:   Yes   CT SOFT TISSUE NECK W CONTRAST    Standing Status:   Future    Expected Date:   10/11/2023    Expiration Date:   09/26/2024    If indicated for the ordered procedure, I authorize the administration of contrast media per Radiology protocol:   Yes    Does the patient have a contrast media/X-ray dye allergy?:   No    Preferred imaging location?:   [facility]      I,Katie Daubenspeck,acting as a scribe for [name], MD.,have documented all relevant documentation on the behalf of [name], MD,as directed by  [name], MD while in the presence of [name], MD.   I, [name] MD, have reviewed the above documentation  for accuracy and completeness, and I agree with the above.   [name], MD   12/16/202412:56 PM  CHIEF COMPLAINT:   Diagnosis: metastatic squamous cell carcinoma    Cancer Staging  Squamous cell carcinoma of head and neck Staging form: Cervical Lymph Nodes and Unknown Primary Tumors of the Head and Neck, AJCC 8th Edition - Clinical stage from 07/16/2022: Stage IVC (cT0, cN3b, cM1) - Unsigned    Prior Therapy: Carboplatin, paclitaxel and pembrolizumab, 07/23/22 - 11/09/22   Current Therapy:  pembrolizumab    HISTORY OF PRESENT ILLNESS:   Oncology History  Squamous cell carcinoma of head and neck  07/16/2022 Initial Diagnosis   Squamous cell carcinoma of head and neck (HCC)   07/23/2022 -  Chemotherapy   Patient is on Treatment Plan : Carboplatin  + Paclitaxel  + Pembrolizumab (200) D1 q21d        INTERVAL HISTORY:   [name] is a 89 y.o. male presenting to clinic today for follow up of metastatic squamous cell carcinoma. He was last seen by me on 07/20/23.  Since his last visit, he underwent biopsy of a right axilla lymph node on 08/04/23. Pathology confirmed metastatic moderate to poorly differentiated squamous cell carcinoma,  positive for p16.  Today, he states that he is doing well overall. His appetite level is at 100%. His energy level is at 10%.  PAST MEDICAL HISTORY:   Past Medical History: Past Medical History:  Diagnosis Date   Anemia    Atrial fibrillation (HCC)    Cataract    Cellulitis    In past   Complication of anesthesia    HARD TIME WAKING; CAUSES MY BP TO GO UP    Coronary artery disease    5 bypasses   DJD (degenerative joint disease)    Ejection fraction < 50%    Mildly reduced, 40% by echo   GERD (gastroesophageal reflux disease)    Hyperlipidemia    Hypertension    Persistent atrial fibrillation (HCC)    Prostate hypertrophy    on CT scan 09/2014   PUD (peptic ulcer disease)    RESOLVED    Skin cancer of eyelid    Resected     Surgical History: Past Surgical History:  Procedure Laterality Date   APPENDECTOMY     BYPASS GRAFT  2005   CATARACT EXTRACTION W/PHACO Left 07/22/2015   Procedure: CATARACT EXTRACTION PHACO AND INTRAOCULAR LENS PLACEMENT LEFT EYE CDE=8.14;  Surgeon: [name], MD;  Location: [facility];  Service: Ophthalmology;  Laterality: Left;   CATARACT EXTRACTION W/PHACO Right 08/18/2019   Procedure: CATARACT EXTRACTION PHACO AND INTRAOCULAR LENS PLACEMENT (IOC);  Surgeon: Fabio Pierce, MD;  Location: [facility];  Service: Ophthalmology;  Laterality: Right;  CDE: 9.74   CORONARY ARTERY BYPASS GRAFT  4/05   LIMA to LAD, SVG to PDA, SVG to ramus intermediate   EYE SURGERY  07/2015   EYELID CARCINOMA EXCISION     Skin cancer resected   Heart bypass  2005   LYMPH NODE BIOPSY Right 06/09/2022   Procedure: LYMPH NODE BIOPSY; supraclavicular;  Surgeon: Lucretia Roers, MD;  Location: [facility];  Service: General;  Laterality: Right;   PORTACATH PLACEMENT Left 06/09/2022   Procedure: INSERTION PORT-A-CATH;  Surgeon: Lucretia Roers, MD;  Location: [facility];  Service: General;  Laterality: Left;   TOTAL HIP ARTHROPLASTY     Right   TOTAL HIP ARTHROPLASTY Left 05/04/2018   Procedure: LEFT TOTAL HIP ARTHROPLASTY;  Surgeon: [name], MD;  Location: [facility];  Service: Orthopedics;  Laterality: Left;   TOTAL KNEE ARTHROPLASTY     Right    Social History: Social History   Socioeconomic History   Marital status: Married    Spouse name: Talbert Forest   Number of children: 1   Years of education: 10   Highest education level: 10th grade  Occupational History   Occupation: retired  Tobacco Use   Smoking status: Former    Current packs/day: 0.00    Average packs/day: 2.0 packs/day for 5.0 years (10.0 ttl pk-yrs)    Types: Cigarettes    Start date: 12/23/1958    Quit date: 12/23/1963    Years since quitting: 59.8   Smokeless tobacco: Never  Vaping Use   Vaping status: Never Used  Substance and Sexual  Activity   Alcohol use: No   Drug use: No   Sexual activity: Yes  Other Topics Concern   Not on file  Social History Narrative   Lives in a split level home.   Social Drivers of Corporate investment banker Strain: Low Risk  (08/03/2023)   Overall Financial Resource Strain (CARDIA)    Difficulty of Paying Living Expenses: Not very hard  Food  Insecurity: No Food Insecurity (08/03/2023)   Hunger Vital Sign    Worried About Running Out of Food in the Last Year: Never true    Ran Out of Food in the Last Year: Never true  Transportation Needs: No Transportation Needs (08/03/2023)   PRAPARE - Administrator, Civil Service (Medical): No    Lack of Transportation (Non-Medical): No  Physical Activity: Insufficiently Active (08/03/2023)   Exercise Vital Sign    Days of Exercise per Week: 2 days    Minutes of Exercise per Session: 60 min  Stress: No Stress Concern Present (08/03/2023)   [employer] of Occupational Health - Occupational Stress Questionnaire    Feeling of Stress : Not at all  Social Connections: Moderately Isolated (08/03/2023)   Social Connection and Isolation Panel [NHANES]    Frequency of Communication with Friends and Family: More than three times a week    Frequency of Social Gatherings with Friends and Family: Twice a week    Attends Religious Services: Never    Database administrator or Organizations: No    Attends Banker Meetings: Never    Marital Status: Married  Catering manager Violence: Not At Risk (01/06/2023)   Humiliation, Afraid, Rape, and Kick questionnaire    Fear of Current or Ex-Partner: No    Emotionally Abused: No    Physically Abused: No    Sexually Abused: No    Family History: Family History  Problem Relation Age of Onset   Stroke Mother    Stroke Father    Hypertension Father    Early death Brother        44 months old   Cancer Brother        lung   Stroke Brother        heat    Current  Medications:  Current Outpatient Medications:    acetaminophen (TYLENOL) 500 MG tablet, Take 1,000 mg by mouth every 6 (six) hours as needed for moderate pain., Disp: , Rfl:    Cholecalciferol (VITAMIN D3) 5000 units CAPS, Take 5,000 Units by mouth once a week. , Disp: , Rfl:    diltiazem (CARDIZEM CD) 120 MG 24 hr capsule, TAKE ONE (1) CAPSULE EACH DAY, Disp: 90 capsule, Rfl: 3   ezetimibe (ZETIA) 10 MG tablet, Take 1 tablet (10 mg total) by mouth daily., Disp: 90 tablet, Rfl: 3   finasteride (PROSCAR) 5 MG tablet, TAKE ONE (1) TABLET EACH DAY, Disp: 90 tablet, Rfl: 3   fluticasone (FLONASE) 50 MCG/ACT nasal spray, Place 1 spray into both nostrils daily as needed for allergies or rhinitis., Disp: , Rfl:    furosemide (LASIX) 20 MG tablet, Take 1 tablet (20 mg total) by mouth daily., Disp: 90 tablet, Rfl: 3   lovastatin (MEVACOR) 40 MG tablet, Take 1 tablet (40 mg total) by mouth at bedtime., Disp: 90 tablet, Rfl: 3   methylphenidate (RITALIN) 5 MG tablet, TAKE 1 TABLET BY MOUTH DAILY AS NEEDED, Disp: 30 tablet, Rfl: 0   metoprolol tartrate (LOPRESSOR) 50 MG tablet, Take 1 tablet (50 mg total) by mouth 2 (two) times daily., Disp: 180 tablet, Rfl: 3   montelukast (SINGULAIR) 10 MG tablet, Take 1 tablet (10 mg total) by mouth at bedtime., Disp: 90 tablet, Rfl: 3   PEMBROLIZUMAB IV, Inject into the vein every 21 ( twenty-one) days., Disp: , Rfl:    predniSONE (DELTASONE) 5 MG tablet, Take 1 tablet (5 mg total) by mouth daily with breakfast., Disp: 90  tablet, Rfl: 2   warfarin (COUMADIN) 5 MG tablet, TAKE 1 TABLET ON SAT, SUN, TUES, & THURSTAKE 1/2 TABLET ON MON, WED, & FRIDAY, Disp: 70 tablet, Rfl: 3   Allergies: Allergies  Allergen Reactions   Penicillins Hives and Rash    Has patient had a PCN reaction causing immediate rash, facial/tongue/throat swelling, SOB or lightheadedness with hypotension: Yes Has patient had a PCN reaction causing severe rash involving mucus membranes or skin necrosis:  Yes Has patient had a PCN reaction that required hospitalization: No Has patient had a PCN reaction occurring within the last 10 years: No If all of the above answers are "NO", then may proceed with Cephalosporin use.    Hct [Hydrochlorothiazide] Other (See Comments)    hyper   Statins Other (See Comments)    Myalgia, tolerates low dosages of lovastatin     REVIEW OF SYSTEMS:   Review of Systems  Constitutional:  Negative for chills, fatigue and fever.  HENT:   Negative for lump/mass, mouth sores, nosebleeds, sore throat and trouble swallowing.   Eyes:  Negative for eye problems.  Respiratory:  Positive for cough. Negative for shortness of breath.   Cardiovascular:  Negative for chest pain, leg swelling and palpitations.  Gastrointestinal:  Positive for constipation. Negative for abdominal pain, diarrhea, nausea and vomiting.  Genitourinary:  Negative for bladder incontinence, difficulty urinating, dysuria, frequency, hematuria and nocturia.   Musculoskeletal:  Negative for arthralgias, back pain, flank pain, myalgias and neck pain.  Skin:  Negative for itching and rash.  Neurological:  Positive for dizziness. Negative for headaches and numbness.  Hematological:  Does not bruise/bleed easily.  Psychiatric/Behavioral:  Negative for depression, sleep disturbance and suicidal ideas. The patient is not nervous/anxious.   All other systems reviewed and are negative.    VITALS:   There were no vitals taken for this visit.  Wt Readings from Last 3 Encounters:  09/16/23 186 lb (84.4 kg)  09/02/23 187 lb (84.8 kg)  08/09/23 188 lb (85.3 kg)    There is no height or weight on file to calculate BMI.  Performance status (ECOG): 1 - Symptomatic but completely ambulatory  PHYSICAL EXAM:   Physical Exam Vitals and nursing note reviewed. Exam conducted with a chaperone present.  Constitutional:      Appearance: Normal appearance.  Cardiovascular:     Rate and Rhythm: Normal rate and  regular rhythm.     Pulses: Normal pulses.     Heart sounds: Normal heart sounds.  Pulmonary:     Effort: Pulmonary effort is normal.     Breath sounds: Normal breath sounds.  Abdominal:     Palpations: Abdomen is soft. There is no hepatomegaly, splenomegaly or mass.     Tenderness: There is no abdominal tenderness.  Musculoskeletal:     Right lower leg: No edema.     Left lower leg: No edema.  Lymphadenopathy:     Cervical: No cervical adenopathy.     Right cervical: No superficial, deep or posterior cervical adenopathy.    Left cervical: No superficial, deep or posterior cervical adenopathy.     Upper Body:     Right upper body: No supraclavicular or axillary adenopathy.     Left upper body: No supraclavicular or axillary adenopathy.  Neurological:     General: No focal deficit present.     Mental Status: He is alert and oriented to person, place, and time.  Psychiatric:        Mood and Affect: Mood  normal.        Behavior: Behavior normal.     LABS:      Latest Ref Rng & Units 09/27/2023   11:23 AM 08/30/2023    9:55 AM 08/09/2023    8:09 AM  CBC  WBC 4.0 - 10.5 K/uL 13.7  14.7  13.1   Hemoglobin 13.0 - 17.0 g/dL 41.3  24.4  01.0   Hematocrit 39.0 - 52.0 % 38.5  40.0  39.5   Platelets 150 - 400 K/uL 231  205  269       Latest Ref Rng & Units 09/27/2023   11:23 AM 08/30/2023    9:55 AM 08/09/2023    8:09 AM  CMP  Glucose 70 - 99 mg/dL 91  272  536   BUN 8 - 23 mg/dL 20  19  18    Creatinine 0.61 - 1.24 mg/dL 6.44  0.34  7.42   Sodium 135 - 145 mmol/L 140  140  139   Potassium 3.5 - 5.1 mmol/L 3.7  3.6  3.6   Chloride 98 - 111 mmol/L 104  105  103   CO2 22 - 32 mmol/L 26  28  27    Calcium 8.9 - 10.3 mg/dL 9.0  9.0  8.8   Total Protein 6.5 - 8.1 g/dL 7.0  7.1  7.0   Total Bilirubin <1.2 mg/dL 0.5  0.6  0.4   Alkaline Phos 38 - 126 U/L 62  59  59   AST 15 - 41 U/L 16  17  15    ALT 0 - 44 U/L 12  13  14       Lab Results  Component Value Date   CEA1 5.4  (H) 06/03/2022   /  CEA  Date Value Ref Range Status  06/03/2022 5.4 (H) 0.0 - 4.7 ng/mL Final    Comment:    (NOTE)                             Nonsmokers          <3.9                             Smokers             <5.6 Roche [employer]ostics Electrochemiluminescence Immunoassay (ECLIA) Values obtained with different assay methods or kits cannot be used interchangeably.  Results cannot be interpreted as absolute evidence of the presence or absence of malignant disease. Performed At: [facility] [address] [place], [place] [id number] Jolene Schimke MD [id number]    Lab Results  Component Value Date   PSA1 11.3 (H) 02/16/2022   Lab Results  Component Value Date   CAN199 53 (H) 06/03/2022   No results found for: "CAN125"  No results found for: "TOTALPROTELP", "ALBUMINELP", "A1GS", "A2GS", "BETS", "BETA2SER", "GAMS", "MSPIKE", "SPEI" No results found for: "TIBC", "FERRITIN", "IRONPCTSAT" Lab Results  Component Value Date   LDH 164 06/03/2022     STUDIES:   No results found.

## 2023-09-27 ENCOUNTER — Inpatient Hospital Stay: Payer: Medicare Other

## 2023-09-27 ENCOUNTER — Inpatient Hospital Stay: Payer: Medicare Other | Attending: Hematology | Admitting: Hematology

## 2023-09-27 VITALS — BP 129/86 | HR 78 | Temp 97.0°F | Resp 20

## 2023-09-27 VITALS — BP 139/82 | HR 79 | Temp 97.2°F | Resp 20 | Wt 186.9 lb

## 2023-09-27 DIAGNOSIS — C4442 Squamous cell carcinoma of skin of scalp and neck: Secondary | ICD-10-CM

## 2023-09-27 DIAGNOSIS — Z79620 Long term (current) use of immunosuppressive biologic: Secondary | ICD-10-CM | POA: Insufficient documentation

## 2023-09-27 DIAGNOSIS — Z5112 Encounter for antineoplastic immunotherapy: Secondary | ICD-10-CM | POA: Diagnosis not present

## 2023-09-27 DIAGNOSIS — Z95828 Presence of other vascular implants and grafts: Secondary | ICD-10-CM

## 2023-09-27 DIAGNOSIS — C4492 Squamous cell carcinoma of skin, unspecified: Secondary | ICD-10-CM

## 2023-09-27 DIAGNOSIS — Z87891 Personal history of nicotine dependence: Secondary | ICD-10-CM | POA: Insufficient documentation

## 2023-09-27 DIAGNOSIS — C7989 Secondary malignant neoplasm of other specified sites: Secondary | ICD-10-CM | POA: Diagnosis not present

## 2023-09-27 DIAGNOSIS — D72820 Lymphocytosis (symptomatic): Secondary | ICD-10-CM | POA: Diagnosis not present

## 2023-09-27 DIAGNOSIS — R918 Other nonspecific abnormal finding of lung field: Secondary | ICD-10-CM | POA: Insufficient documentation

## 2023-09-27 DIAGNOSIS — R590 Localized enlarged lymph nodes: Secondary | ICD-10-CM | POA: Diagnosis not present

## 2023-09-27 DIAGNOSIS — R300 Dysuria: Secondary | ICD-10-CM | POA: Diagnosis not present

## 2023-09-27 DIAGNOSIS — C778 Secondary and unspecified malignant neoplasm of lymph nodes of multiple regions: Secondary | ICD-10-CM | POA: Insufficient documentation

## 2023-09-27 DIAGNOSIS — Z801 Family history of malignant neoplasm of trachea, bronchus and lung: Secondary | ICD-10-CM | POA: Diagnosis not present

## 2023-09-27 DIAGNOSIS — R530 Neoplastic (malignant) related fatigue: Secondary | ICD-10-CM | POA: Insufficient documentation

## 2023-09-27 DIAGNOSIS — C760 Malignant neoplasm of head, face and neck: Secondary | ICD-10-CM | POA: Diagnosis not present

## 2023-09-27 LAB — CBC WITH DIFFERENTIAL/PLATELET
Abs Immature Granulocytes: 0.07 10*3/uL (ref 0.00–0.07)
Basophils Absolute: 0.1 10*3/uL (ref 0.0–0.1)
Basophils Relative: 0 %
Eosinophils Absolute: 0.7 10*3/uL — ABNORMAL HIGH (ref 0.0–0.5)
Eosinophils Relative: 5 %
HCT: 38.5 % — ABNORMAL LOW (ref 39.0–52.0)
Hemoglobin: 12.6 g/dL — ABNORMAL LOW (ref 13.0–17.0)
Immature Granulocytes: 1 %
Lymphocytes Relative: 31 %
Lymphs Abs: 4.2 10*3/uL — ABNORMAL HIGH (ref 0.7–4.0)
MCH: 34.1 pg — ABNORMAL HIGH (ref 26.0–34.0)
MCHC: 32.7 g/dL (ref 30.0–36.0)
MCV: 104.1 fL — ABNORMAL HIGH (ref 80.0–100.0)
Monocytes Absolute: 0.9 10*3/uL (ref 0.1–1.0)
Monocytes Relative: 6 %
Neutro Abs: 7.9 10*3/uL — ABNORMAL HIGH (ref 1.7–7.7)
Neutrophils Relative %: 57 %
Platelets: 231 10*3/uL (ref 150–400)
RBC: 3.7 MIL/uL — ABNORMAL LOW (ref 4.22–5.81)
RDW: 13.1 % (ref 11.5–15.5)
WBC: 13.7 10*3/uL — ABNORMAL HIGH (ref 4.0–10.5)
nRBC: 0 % (ref 0.0–0.2)

## 2023-09-27 LAB — COMPREHENSIVE METABOLIC PANEL
ALT: 12 U/L (ref 0–44)
AST: 16 U/L (ref 15–41)
Albumin: 3.5 g/dL (ref 3.5–5.0)
Alkaline Phosphatase: 62 U/L (ref 38–126)
Anion gap: 10 (ref 5–15)
BUN: 20 mg/dL (ref 8–23)
CO2: 26 mmol/L (ref 22–32)
Calcium: 9 mg/dL (ref 8.9–10.3)
Chloride: 104 mmol/L (ref 98–111)
Creatinine, Ser: 0.96 mg/dL (ref 0.61–1.24)
GFR, Estimated: >60 mL/min (ref >60)
Glucose, Bld: 91 mg/dL (ref 70–99)
Potassium: 3.7 mmol/L (ref 3.5–5.1)
Sodium: 140 mmol/L (ref 135–145)
Total Bilirubin: 0.5 mg/dL (ref <1.2)
Total Protein: 7 g/dL (ref 6.5–8.1)

## 2023-09-27 LAB — MAGNESIUM: Magnesium: 2.1 mg/dL (ref 1.7–2.4)

## 2023-09-27 LAB — TSH: TSH: 1.06 u[IU]/mL (ref 0.350–4.500)

## 2023-09-27 MED ORDER — SODIUM CHLORIDE 0.9 % IV SOLN
Freq: Once | INTRAVENOUS | Status: AC
Start: 2023-09-27 — End: 2023-09-27

## 2023-09-27 MED ORDER — SODIUM CHLORIDE 0.9% FLUSH
10.0000 mL | INTRAVENOUS | Status: DC | PRN
  Administered 2023-09-27: 10 mL via INTRAVENOUS

## 2023-09-27 MED ORDER — SODIUM CHLORIDE 0.9% FLUSH
10.0000 mL | INTRAVENOUS | Status: DC | PRN
Start: 2023-09-27 — End: 2023-09-27
  Administered 2023-09-27: 10 mL

## 2023-09-27 MED ORDER — HEPARIN SOD (PORK) LOCK FLUSH 100 UNIT/ML IV SOLN
500.0000 [IU] | Freq: Once | INTRAVENOUS | Status: AC | PRN
  Administered 2023-09-27: 500 [IU]

## 2023-09-27 MED ORDER — SODIUM CHLORIDE 0.9 % IV SOLN
200.0000 mg | Freq: Once | INTRAVENOUS | Status: AC
  Administered 2023-09-27: 200 mg via INTRAVENOUS
  Filled 2023-09-27: qty 8

## 2023-09-27 MED ORDER — PREDNISONE 5 MG PO TABS: 5.0000 mg | ORAL_TABLET | Freq: Every day | ORAL | 2 refills | Status: DC

## 2023-09-27 NOTE — Progress Notes (Signed)
Patient presents today for chemotherapy infusion. Patient is in satisfactory condition with no new complaints voiced.  Vital signs are stable.  Labs reviewed by Dr. Ellin Saba during the office visit and all labs are within treatment parameters.  We will proceed with treatment per MD orders.   Patient tolerated treatment well with no complaints voiced.  Patient left ambulatory with wife in stable condition.  Vital signs stable at discharge.  Follow up as scheduled.

## 2023-09-27 NOTE — Patient Instructions (Addendum)
[facility] at [facility] Discharge Instructions   You were seen and examined today by Dr. Ellin Saba.  He reviewed the results of your lab work which are normal/stable.   He reviewed the results of the biopsy you had on the lymph node. It is showing lung cancer spread to the lymph node. We will repeat a CT scan prior to your next visit.   We will proceed with your treatment today.   Return as scheduled.    Thank you for choosing [facility] at [facility] to provide your oncology and hematology care.  To afford each patient quality time with our provider, please arrive at least 15 minutes before your scheduled appointment time.   If you have a lab appointment with the Cancer Center please come in thru the Main Entrance and check in at the main information desk.  You need to re-schedule your appointment should you arrive 10 or more minutes late.  We strive to give you quality time with our providers, and arriving late affects you and other patients whose appointments are after yours.  Also, if you no show three or more times for appointments you may be dismissed from the clinic at the providers discretion.     Again, thank you for choosing [facility].  Our hope is that these requests will decrease the amount of time that you wait before being seen by our physicians.       _____________________________________________________________  Should you have questions after your visit to [facility], please contact our office at [phone] and follow the prompts.  Our office hours are 8:00 a.m. and 4:30 p.m. Monday - Friday.  Please note that voicemails left after 4:00 p.m. may not be returned until the following business day.  We are closed weekends and major holidays.  You do have access to a nurse 24-7, just call the main number to the clinic [phone] and do not press any options, hold on the line and a nurse will answer  the phone.    For prescription refill requests, have your pharmacy contact our office and allow 72 hours.    Due to Covid, you will need to wear a mask upon entering the hospital. If you do not have a mask, a mask will be given to you at the Main Entrance upon arrival. For doctor visits, patients may have 1 support person age 52 or older with them. For treatment visits, patients can not have anyone with them due to social distancing guidelines and our immunocompromised population.

## 2023-09-27 NOTE — Patient Instructions (Signed)
 [facility] [place] - A DEPT OF [facility]  Discharge Instructions: Thank you for choosing  Cancer Center to provide your oncology and hematology care.  If you have a lab appointment with the Cancer Center - please note that after April 8th, 2024, all labs will be drawn in the cancer center.  You do not have to check in or register with the main entrance as you have in the past but will complete your check-in in the cancer center.  Wear comfortable clothing and clothing appropriate for easy access to any Portacath or PICC line.   We strive to give you quality time with your provider. You may need to reschedule your appointment if you arrive late (15 or more minutes).  Arriving late affects you and other patients whose appointments are after yours.  Also, if you miss three or more appointments without notifying the office, you may be dismissed from the clinic at the provider's discretion.      For prescription refill requests, have your pharmacy contact our office and allow 72 hours for refills to be completed.    Today you received the following chemotherapy and/or immunotherapy agents Keytruda. Pembrolizumab Injection What is this medication? PEMBROLIZUMAB (PEM broe LIZ ue mab) treats some types of cancer. It works by helping your immune system slow or stop the spread of cancer cells. It is a monoclonal antibody. This medicine may be used for other purposes; ask your health care provider or pharmacist if you have questions. COMMON BRAND NAME(S): Keytruda What should I tell my care team before I take this medication? They need to know if you have any of these conditions: Allogeneic stem cell transplant (uses someone else's stem cells) Autoimmune diseases, such as Crohn disease, ulcerative colitis, lupus History of chest radiation Nervous system problems, such as Guillain-Barre syndrome, myasthenia gravis Organ transplant An unusual or allergic reaction to  pembrolizumab, other medications, foods, dyes, or preservatives Pregnant or trying to get pregnant Breast-feeding How should I use this medication? This medication is injected into a vein. It is given by your care team in a hospital or clinic setting. A special MedGuide will be given to you before each treatment. Be sure to read this information carefully each time. Talk to your care team about the use of this medication in children. While it may be prescribed for children as young as 6 months for selected conditions, precautions do apply. Overdosage: If you think you have taken too much of this medicine contact a poison control center or emergency room at once. NOTE: This medicine is only for you. Do not share this medicine with others. What if I miss a dose? Keep appointments for follow-up doses. It is important not to miss your dose. Call your care team if you are unable to keep an appointment. What may interact with this medication? Interactions have not been studied. This list may not describe all possible interactions. Give your health care provider a list of all the medicines, herbs, non-prescription drugs, or dietary supplements you use. Also tell them if you smoke, drink alcohol, or use illegal drugs. Some items may interact with your medicine. What should I watch for while using this medication? Your condition will be monitored carefully while you are receiving this medication. You may need blood work while taking this medication. This medication may cause serious skin reactions. They can happen weeks to months after starting the medication. Contact your care team right away if you notice  fevers or flu-like symptoms with a rash. The rash may be red or purple and then turn into blisters or peeling of the skin. You may also notice a red rash with swelling of the face, lips, or lymph nodes in your neck or under your arms. Tell your care team right away if you have any change in your  eyesight. Talk to your care team if you may be pregnant. Serious birth defects can occur if you take this medication during pregnancy and for 4 months after the last dose. You will need a negative pregnancy test before starting this medication. Contraception is recommended while taking this medication and for 4 months after the last dose. Your care team can help you find the option that works for you. Do not breastfeed while taking this medication and for 4 months after the last dose. What side effects may I notice from receiving this medication? Side effects that you should report to your care team as soon as possible: Allergic reactions--skin rash, itching, hives, swelling of the face, lips, tongue, or throat Dry cough, shortness of breath or trouble breathing Eye pain, redness, irritation, or discharge with blurry or decreased vision Heart muscle inflammation--unusual weakness or fatigue, shortness of breath, chest pain, fast or irregular heartbeat, dizziness, swelling of the ankles, feet, or hands Hormone gland problems--headache, sensitivity to light, unusual weakness or fatigue, dizziness, fast or irregular heartbeat, increased sensitivity to cold or heat, excessive sweating, constipation, hair loss, increased thirst or amount of urine, tremors or shaking, irritability Infusion reactions--chest pain, shortness of breath or trouble breathing, feeling faint or lightheaded Kidney injury (glomerulonephritis)--decrease in the amount of urine, red or dark brown urine, foamy or bubbly urine, swelling of the ankles, hands, or feet Liver injury--right upper belly pain, loss of appetite, nausea, light-colored stool, dark yellow or brown urine, yellowing skin or eyes, unusual weakness or fatigue Pain, tingling, or numbness in the hands or feet, muscle weakness, change in vision, confusion or trouble speaking, loss of balance or coordination, trouble walking, seizures Rash, fever, and swollen lymph  nodes Redness, blistering, peeling, or loosening of the skin, including inside the mouth Sudden or severe stomach pain, bloody diarrhea, fever, nausea, vomiting Side effects that usually do not require medical attention (report to your care team if they continue or are bothersome): Bone, joint, or muscle pain Diarrhea Fatigue Loss of appetite Nausea Skin rash This list may not describe all possible side effects. Call your doctor for medical advice about side effects. You may report side effects to FDA at 1-800-FDA-1088. Where should I keep my medication? This medication is given in a hospital or clinic. It will not be stored at home. NOTE: This sheet is a summary. It may not cover all possible information. If you have questions about this medicine, talk to your doctor, pharmacist, or health care provider.  2024 Elsevier/Gold Standard (2022-02-10 00:00:00)       To help prevent nausea and vomiting after your treatment, we encourage you to take your nausea medication as directed.  BELOW ARE SYMPTOMS THAT SHOULD BE REPORTED IMMEDIATELY: *FEVER GREATER THAN 100.4 F (38 C) OR HIGHER *CHILLS OR SWEATING *NAUSEA AND VOMITING THAT IS NOT CONTROLLED WITH YOUR NAUSEA MEDICATION *UNUSUAL SHORTNESS OF BREATH *UNUSUAL BRUISING OR BLEEDING *URINARY PROBLEMS (pain or burning when urinating, or frequent urination) *BOWEL PROBLEMS (unusual diarrhea, constipation, pain near the anus) TENDERNESS IN MOUTH AND THROAT WITH OR WITHOUT PRESENCE OF ULCERS (sore throat, sores in mouth, or a toothache) UNUSUAL RASH, SWELLING  OR PAIN  UNUSUAL VAGINAL DISCHARGE OR ITCHING   Items with * indicate a potential emergency and should be followed up as soon as possible or go to the Emergency Department if any problems should occur.  Please show the CHEMOTHERAPY ALERT CARD or IMMUNOTHERAPY ALERT CARD at check-in to the Emergency Department and triage nurse.  Should you have questions after your visit or need to  cancel or reschedule your appointment, please contact [facility] [place] - A DEPT OF [facility] [phone]  and follow the prompts.  Office hours are 8:00 a.m. to 4:30 p.m. Monday - Friday. Please note that voicemails left after 4:00 p.m. may not be returned until the following business day.  We are closed weekends and major holidays. You have access to a nurse at all times for urgent questions. Please call the main number to the clinic [phone] and follow the prompts.  For any non-urgent questions, you may also contact your provider using MyChart. We now offer e-Visits for anyone 42 and older to request care online for non-urgent symptoms. For details visit [email or web address].   Also download the MyChart app! Go to the app store, search "MyChart", open the app, select [place], and log in with your MyChart username and password.

## 2023-09-27 NOTE — Progress Notes (Signed)
Patients port flushed without difficulty.  Good blood return noted with no bruising or swelling noted at site.  Patient remains accessed for treatment.  

## 2023-10-12 ENCOUNTER — Encounter (HOSPITAL_COMMUNITY): Payer: Self-pay | Admitting: Radiology

## 2023-10-12 ENCOUNTER — Ambulatory Visit (HOSPITAL_COMMUNITY)
Admission: RE | Admit: 2023-10-12 | Discharge: 2023-10-12 | Disposition: A | Discharge status: Home / Self Care | Payer: Medicare Other | Source: Ambulatory Visit | Attending: Hematology | Admitting: Hematology

## 2023-10-12 DIAGNOSIS — C4492 Squamous cell carcinoma of skin, unspecified: Secondary | ICD-10-CM | POA: Diagnosis not present

## 2023-10-12 DIAGNOSIS — C4442 Squamous cell carcinoma of skin of scalp and neck: Secondary | ICD-10-CM | POA: Diagnosis not present

## 2023-10-12 DIAGNOSIS — R591 Generalized enlarged lymph nodes: Secondary | ICD-10-CM | POA: Diagnosis not present

## 2023-10-12 DIAGNOSIS — I6523 Occlusion and stenosis of bilateral carotid arteries: Secondary | ICD-10-CM | POA: Diagnosis not present

## 2023-10-12 DIAGNOSIS — C760 Malignant neoplasm of head, face and neck: Secondary | ICD-10-CM | POA: Diagnosis not present

## 2023-10-12 DIAGNOSIS — C7989 Secondary malignant neoplasm of other specified sites: Secondary | ICD-10-CM | POA: Diagnosis not present

## 2023-10-12 DIAGNOSIS — N289 Disorder of kidney and ureter, unspecified: Secondary | ICD-10-CM | POA: Diagnosis not present

## 2023-10-12 DIAGNOSIS — R590 Localized enlarged lymph nodes: Secondary | ICD-10-CM | POA: Insufficient documentation

## 2023-10-12 DIAGNOSIS — J8410 Pulmonary fibrosis, unspecified: Secondary | ICD-10-CM | POA: Diagnosis not present

## 2023-10-12 MED ORDER — IOHEXOL 300 MG/ML  SOLN
125.0000 mL | Freq: Once | INTRAMUSCULAR | Status: AC | PRN
  Administered 2023-10-12: 125 mL via INTRAVENOUS

## 2023-10-18 ENCOUNTER — Inpatient Hospital Stay: Payer: Medicare Other

## 2023-10-18 ENCOUNTER — Inpatient Hospital Stay: Payer: Medicare Other | Admitting: Hematology

## 2023-10-18 ENCOUNTER — Inpatient Hospital Stay: Payer: Medicare Other | Attending: Hematology

## 2023-10-18 VITALS — BP 130/76 | HR 77 | Temp 96.4°F | Resp 18 | Wt 187.2 lb

## 2023-10-18 DIAGNOSIS — Z95828 Presence of other vascular implants and grafts: Secondary | ICD-10-CM

## 2023-10-18 DIAGNOSIS — R530 Neoplastic (malignant) related fatigue: Secondary | ICD-10-CM | POA: Insufficient documentation

## 2023-10-18 DIAGNOSIS — C9110 Chronic lymphocytic leukemia of B-cell type not having achieved remission: Secondary | ICD-10-CM

## 2023-10-18 DIAGNOSIS — D72820 Lymphocytosis (symptomatic): Secondary | ICD-10-CM | POA: Insufficient documentation

## 2023-10-18 DIAGNOSIS — Z801 Family history of malignant neoplasm of trachea, bronchus and lung: Secondary | ICD-10-CM | POA: Diagnosis not present

## 2023-10-18 DIAGNOSIS — C778 Secondary and unspecified malignant neoplasm of lymph nodes of multiple regions: Secondary | ICD-10-CM | POA: Insufficient documentation

## 2023-10-18 DIAGNOSIS — C4442 Squamous cell carcinoma of skin of scalp and neck: Secondary | ICD-10-CM

## 2023-10-18 DIAGNOSIS — Z79620 Long term (current) use of immunosuppressive biologic: Secondary | ICD-10-CM | POA: Insufficient documentation

## 2023-10-18 DIAGNOSIS — Z87891 Personal history of nicotine dependence: Secondary | ICD-10-CM | POA: Insufficient documentation

## 2023-10-18 DIAGNOSIS — Z5111 Encounter for antineoplastic chemotherapy: Secondary | ICD-10-CM | POA: Insufficient documentation

## 2023-10-18 DIAGNOSIS — C801 Malignant (primary) neoplasm, unspecified: Secondary | ICD-10-CM | POA: Insufficient documentation

## 2023-10-18 LAB — COMPREHENSIVE METABOLIC PANEL
ALT: 19 U/L (ref 0–44)
AST: 17 U/L (ref 15–41)
Albumin: 3.5 g/dL (ref 3.5–5.0)
Alkaline Phosphatase: 57 U/L (ref 38–126)
Anion gap: 6 (ref 5–15)
BUN: 22 mg/dL (ref 8–23)
CO2: 27 mmol/L (ref 22–32)
Calcium: 8.8 mg/dL — ABNORMAL LOW (ref 8.9–10.3)
Chloride: 105 mmol/L (ref 98–111)
Creatinine, Ser: 1 mg/dL (ref 0.61–1.24)
GFR, Estimated: >60 mL/min (ref >60)
Glucose, Bld: 115 mg/dL — ABNORMAL HIGH (ref 70–99)
Potassium: 3.8 mmol/L (ref 3.5–5.1)
Sodium: 138 mmol/L (ref 135–145)
Total Bilirubin: 0.5 mg/dL (ref 0.0–1.2)
Total Protein: 6.8 g/dL (ref 6.5–8.1)

## 2023-10-18 LAB — TSH: TSH: 1.199 u[IU]/mL (ref 0.350–4.500)

## 2023-10-18 LAB — CBC WITH DIFFERENTIAL/PLATELET
Abs Immature Granulocytes: 0.15 10*3/uL — ABNORMAL HIGH (ref 0.00–0.07)
Basophils Absolute: 0.1 10*3/uL (ref 0.0–0.1)
Basophils Relative: 0 %
Eosinophils Absolute: 0.3 10*3/uL (ref 0.0–0.5)
Eosinophils Relative: 2 %
HCT: 40.1 % (ref 39.0–52.0)
Hemoglobin: 12.8 g/dL — ABNORMAL LOW (ref 13.0–17.0)
Immature Granulocytes: 1 %
Lymphocytes Relative: 26 %
Lymphs Abs: 4.5 10*3/uL — ABNORMAL HIGH (ref 0.7–4.0)
MCH: 33.1 pg (ref 26.0–34.0)
MCHC: 31.9 g/dL (ref 30.0–36.0)
MCV: 103.6 fL — ABNORMAL HIGH (ref 80.0–100.0)
Monocytes Absolute: 0.7 10*3/uL (ref 0.1–1.0)
Monocytes Relative: 4 %
Neutro Abs: 11.4 10*3/uL — ABNORMAL HIGH (ref 1.7–7.7)
Neutrophils Relative %: 67 %
Platelets: 207 10*3/uL (ref 150–400)
RBC: 3.87 MIL/uL — ABNORMAL LOW (ref 4.22–5.81)
RDW: 13.2 % (ref 11.5–15.5)
WBC: 17.2 10*3/uL — ABNORMAL HIGH (ref 4.0–10.5)
nRBC: 0 % (ref 0.0–0.2)

## 2023-10-18 LAB — MAGNESIUM: Magnesium: 2.1 mg/dL (ref 1.7–2.4)

## 2023-10-18 MED ORDER — HEPARIN SOD (PORK) LOCK FLUSH 100 UNIT/ML IV SOLN
500.0000 [IU] | Freq: Once | INTRAVENOUS | Status: AC
  Administered 2023-10-18: 500 [IU] via INTRAVENOUS

## 2023-10-18 MED ORDER — DOXYCYCLINE HYCLATE 100 MG PO TABS: 100.0000 mg | ORAL_TABLET | Freq: Two times a day (BID) | ORAL | 2 refills | Status: DC

## 2023-10-18 MED ORDER — SODIUM CHLORIDE 0.9% FLUSH
10.0000 mL | INTRAVENOUS | Status: DC | PRN
  Administered 2023-10-18: 10 mL via INTRAVENOUS

## 2023-10-18 NOTE — Progress Notes (Signed)
 [facility] [address] [place] [address]    Clinic Day:  10/18/2023  Referring physician: [name], Fonda LABOR, MD  Patient Care Team: [name], Fonda LABOR, MD as PCP - General (Family Medicine) [name], MD as PCP - Cardiology (Cardiology) Watt Rush, MD as Attending Physician (Urology) [name], MD as Consulting Physician (Cardiology) Golda Claudis PENNER, MD (Inactive) as Consulting Physician (Gastroenterology) Perley Hamilton, MD as Consulting Physician (Ophthalmology) Heide Ingle, MD as Consulting Physician (Orthopedic Surgery) Frances, Ozell RAMAN, LCSW as [facility] Care Management (Licensed Clinical Social Worker) Rogers Hai, MD as Medical Oncologist (Medical Oncology) Celestia Joesph SQUIBB, RN as Oncology Nurse Navigator (Medical Oncology)   ASSESSMENT & PLAN:   Assessment: 1.  Right supraclavicular lymphadenopathy and multiple lung nodules: - Patient noticed right neck mass since Christmas 2022.  10 to 15 pound weight loss since January 2023.  Denies any dysphagia or odynophagia. - CT neck (05/27/2022): Numerous lymph nodes in the right neck, right supraclavicular lymph node mass measures 5.7 x 3.7 cm.  18 mm posterior lymph node in the right lower neck.  Numerous lymph nodes in the right lower and posterior neck approximately 1 cm.  Many have internal necrosis consistent with metastatic disease.  20 mm lymph node just above the right clavicle.  Left level 2 node 12 mm. - CT CAP (05/28/2022): Several bilateral lung nodules, RUL nodule measuring 1.8 x 1.3 cm.  Small clusters of nodules in the medial left upper lobe.  Irregular nodular density along the left side of the mediastinum measuring 2.1 cm.  No metastatic disease in the abdomen or pelvis.  Liver is slightly nodular contour. - Lab work shows elevated white count since 2009, predominantly lymphocytosis. - Pathology (06/09/2022): Metastatic carcinoma positive for p40, p63 and  CK5/6-patchy positivity with CK7.  Negative for TTF-1, Napsin A, PAX8, prostein, PSA, CK20 and CDX2.  - PET scan (06/11/2022): Bulky right supraclavicular nodal mass, smaller hypermetabolic right posterior triangle and jugular lymph nodes and single hypermetabolic left level 3 lymph node.  Bilateral hypermetabolic pulmonary nodules and metastatic pattern.  Minimal hypermetabolic mediastinal nodal metastasis.  Cluster of hypermetabolic right axillary lymph nodes.  No evidence of metastatic disease or primary lesion in the abdomen or pelvis.  No bone lesions. - MRI of the brain: Chronic small vessel ischemic changes with no evidence of metastatic disease. - NGS: T p53 pathogenic variant, MSI-stable, TMB-low, LOH-low, PD-L1 (SP142): Negative, p16 and p18 negative.  HER2 by IHC 0. - PD-L1 22 C3: CPS score 25 - Cancer type ID: 96% probability squamous cell carcinoma, subtype head and neck/skin.  Cancer types ruled out with 95% confidence includes skin basal cell carcinoma. - Carboplatin , Taxol  and pembrolizumab  07/23/2022 through 11/09/22 - Right axillary lymph node biopsy (08/04/2023): Metastatic moderate to poorly differentiated squamous cell carcinoma.   2.  Social/family history: - He lives at home with his wife.  Independent of ADLs and IADLs.  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  May have had asbestos exposure 30 years ago.  Quit smoking cigarettes in 1965.  Smoked 1 pack/day for less than 12 years.  2 of his brothers had lung cancer and both were smokers.    Plan: 1.  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 - Reviewed CT soft tissue neck from 10/12/2023: New bilateral lower cervical lymphadenopathy measuring around 1.5 cm. - CT CAP on 10/12/2023: Progressive  lower neck, mediastinal and right axillary adenopathy.  Nonspecific area of minimal subpleural consolidation in the lateral  right upper lobe unchanged. - We discussed further treatment options including best supportive care, single agent chemotherapy.  Due to advanced age, I did not recommend chemotherapy.  We discussed role of cetuximab  every 2 weeks and associated side effects. - He is willing to try cetuximab  at this time.  We discussed side effects including rare chance of anaphylactic reactions. - CT scan also showed possible sinus infection in the maxillary sinus. - Will start him on doxycycline  100 mg twice daily both for sinus infection and prophylaxis for acneform rash from cetuximab . - I will request her to by IHC testing on the axillary lymph node biopsy to see if he is a candidate for Enhertu. - We will premedicate him with steroid, H1 and H2 blockers.  He is already taking Singulair  daily for his sinus symptoms. - Will proceed with first infusion next week.  RTC prior to second infusion.   2.  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   3.  Cancer-related fatigue: - Continue Ritalin  5 mg in the mornings as needed.  He has not required it since we started him on low-dose prednisone .  4.  Polyarthralgias: - This is from immunotherapy.  We started prednisone  5 mg daily at last visit which has helped.    Orders Placed This Encounter  Procedures   Magnesium     Standing Status:   Future    Expected Date:   10/25/2023    Expiration Date:   10/24/2024   Basic metabolic panel    Standing Status:   Future    Expected Date:   10/25/2023    Expiration Date:   10/24/2024   Magnesium     Standing Status:   Future    Expected Date:   11/08/2023    Expiration Date:   11/07/2024   Basic metabolic panel    Standing Status:   Future    Expected Date:   11/08/2023    Expiration Date:   11/07/2024   Magnesium     Standing Status:   Future    Expected Date:   11/22/2023    Expiration Date:   11/21/2024   Basic metabolic panel    Standing Status:    Future    Expected Date:   11/22/2023    Expiration Date:   11/21/2024   Magnesium     Standing Status:   Future    Expected Date:   12/06/2023    Expiration Date:   12/05/2024   Basic metabolic panel    Standing Status:   Future    Expected Date:   12/06/2023    Expiration Date:   12/05/2024   Magnesium     Standing Status:   Future    Expected Date:   12/20/2023    Expiration Date:   12/19/2024   Basic metabolic panel    Standing Status:   Future    Expected Date:   12/20/2023    Expiration Date:   12/19/2024   Magnesium     Standing Status:   Future    Expected Date:   01/03/2024    Expiration Date:   01/02/2025   Basic metabolic panel    Standing Status:   Future    Expected Date:   01/03/2024    Expiration Date:   01/02/2025      LILLETTE Hummingbird R Teague,acting as a scribe for  Alean Stands, MD.,have documented all relevant documentation on the behalf of Alean Stands, MD,as directed by  Alean Stands, MD while in the presence of Alean Stands, MD.  I, Alean Stands MD, have reviewed the above documentation for accuracy and completeness, and I agree with the above.    Alean Stands, MD   1/6/20254:25 PM  CHIEF COMPLAINT:   Diagnosis: metastatic squamous cell carcinoma    Cancer Staging  Squamous cell carcinoma of head and neck Staging form: Cervical Lymph Nodes and Unknown Primary Tumors of the Head and Neck, AJCC 8th Edition - Clinical stage from 07/16/2022: Stage IVC (cT0, cN3b, cM1) - Unsigned    Prior Therapy: Carboplatin , paclitaxel  and pembrolizumab , 07/23/22 - 11/09/22   Current Therapy:  pembrolizumab     HISTORY OF PRESENT ILLNESS:   Oncology History  Squamous cell carcinoma of head and neck  07/16/2022 Initial Diagnosis   Squamous cell carcinoma of head and neck (HCC)   07/23/2022 - 09/27/2023 Chemotherapy   Patient is on Treatment Plan : Carboplatin   + Paclitaxel   + Pembrolizumab  (200) D1 q21d     10/25/2023 -  Chemotherapy    Patient is on Treatment Plan : HEAD/NECK Cetuximab  q14d        INTERVAL HISTORY:   [name] is a 89 y.o. male presenting to clinic today for follow up of metastatic squamous cell carcinoma. He was last seen by me on 09/27/23.  Since his last visit, he underwent CT C/A/P and neck on 10/12/23.   Today, he states that he is doing well overall. His appetite level is at 100%. His energy level is at 10%.  PAST MEDICAL HISTORY:   Past Medical History: Past Medical History:  Diagnosis Date   Anemia    Atrial fibrillation (HCC)    Cataract    Cellulitis    In past   Complication of anesthesia    HARD TIME WAKING; CAUSES MY BP TO GO UP    Coronary artery disease    5 bypasses   DJD (degenerative joint disease)    Ejection fraction < 50%    Mildly reduced, 40% by echo   GERD (gastroesophageal reflux disease)    Hyperlipidemia    Hypertension    Persistent atrial fibrillation (HCC)    Prostate hypertrophy    on CT scan 09/2014   PUD (peptic ulcer disease)    RESOLVED    Skin cancer of eyelid    Resected    Surgical History: Past Surgical History:  Procedure Laterality Date   APPENDECTOMY     BYPASS GRAFT  2005   CATARACT EXTRACTION W/PHACO Left 07/22/2015   Procedure: CATARACT EXTRACTION PHACO AND INTRAOCULAR LENS PLACEMENT LEFT EYE CDE=8.14;  Surgeon: Cherene Mania, MD;  Location: [facility];  Service: Ophthalmology;  Laterality: Left;   CATARACT EXTRACTION W/PHACO Right 08/18/2019   Procedure: CATARACT EXTRACTION PHACO AND INTRAOCULAR LENS PLACEMENT (IOC);  Surgeon: Harrie Agent, MD;  Location: [facility];  Service: Ophthalmology;  Laterality: Right;  CDE: 9.74   CORONARY ARTERY BYPASS GRAFT  4/05   LIMA to LAD, SVG to PDA, SVG to ramus intermediate   EYE SURGERY  07/2015   EYELID CARCINOMA EXCISION     Skin cancer resected   Heart bypass  2005   LYMPH NODE BIOPSY Right 06/09/2022   Procedure: LYMPH NODE BIOPSY; supraclavicular;  Surgeon: Kallie Manuelita BROCKS, MD;  Location: [facility];   Service: General;  Laterality: Right;   PORTACATH PLACEMENT Left 06/09/2022   Procedure: INSERTION PORT-A-CATH;  Surgeon:  Kallie Manuelita BROCKS, MD;  Location: [facility];  Service: General;  Laterality: Left;   TOTAL HIP ARTHROPLASTY     Right   TOTAL HIP ARTHROPLASTY Left 05/04/2018   Procedure: LEFT TOTAL HIP ARTHROPLASTY;  Surgeon: Heide Ingle, MD;  Location: [facility];  Service: Orthopedics;  Laterality: Left;   TOTAL KNEE ARTHROPLASTY     Right    Social History: Social History   Socioeconomic History   Marital status: Married    Spouse name: [name]   Number of children: 1   Years of education: 10   Highest education level: 10th grade  Occupational History   Occupation: retired  Tobacco Use   Smoking status: Former    Current packs/day: 0.00    Average packs/day: 2.0 packs/day for 5.0 years (10.0 ttl pk-yrs)    Types: Cigarettes    Start date: 12/23/1958    Quit date: 12/23/1963    Years since quitting: 59.8   Smokeless tobacco: Never  Vaping Use   Vaping status: Never Used  Substance and Sexual Activity   Alcohol  use: No   Drug use: No   Sexual activity: Yes  Other Topics Concern   Not on file  Social History Narrative   Lives in a split level home.   Social Drivers of Corporate Investment Banker Strain: Low Risk  (08/03/2023)   Overall Financial Resource Strain (CARDIA)    Difficulty of Paying Living Expenses: Not very hard  Food Insecurity: No Food Insecurity (08/03/2023)   Hunger Vital Sign    Worried About Running Out of Food in the Last Year: Never true    Ran Out of Food in the Last Year: Never true  Transportation Needs: No Transportation Needs (08/03/2023)   PRAPARE - Administrator, Civil Service (Medical): No    Lack of Transportation (Non-Medical): No  Physical Activity: Insufficiently Active (08/03/2023)   Exercise Vital Sign    Days of Exercise per Week: 2 days    Minutes of Exercise per Session: 60 min  Stress: No Stress Concern  Present (08/03/2023)   [employer] of Occupational Health - Occupational Stress Questionnaire    Feeling of Stress : Not at all  Social Connections: Moderately Isolated (08/03/2023)   Social Connection and Isolation Panel [NHANES]    Frequency of Communication with Friends and Family: More than three times a week    Frequency of Social Gatherings with Friends and Family: Twice a week    Attends Religious Services: Never    Database Administrator or Organizations: No    Attends Banker Meetings: Never    Marital Status: Married  Catering Manager Violence: Not At Risk (01/06/2023)   Humiliation, Afraid, Rape, and Kick questionnaire    Fear of Current or Ex-Partner: No    Emotionally Abused: No    Physically Abused: No    Sexually Abused: No    Family History: Family History  Problem Relation Age of Onset   Stroke Mother    Stroke Father    Hypertension Father    Early death Brother        30 months old   Cancer Brother        lung   Stroke Brother        heat    Current Medications:  Current Outpatient Medications:    acetaminophen  (TYLENOL ) 500 MG tablet, Take 1,000 mg by mouth every 6 (six) hours as needed for moderate pain., Disp: , Rfl:  Cholecalciferol (VITAMIN D3) 5000 units CAPS, Take 5,000 Units by mouth once a week. , Disp: , Rfl:    diltiazem  (CARDIZEM  CD) 120 MG 24 hr capsule, TAKE ONE (1) CAPSULE EACH DAY, Disp: 90 capsule, Rfl: 3   doxycycline  (VIBRA -TABS) 100 MG tablet, Take 1 tablet (100 mg total) by mouth 2 (two) times daily., Disp: 60 tablet, Rfl: 2   ezetimibe  (ZETIA ) 10 MG tablet, Take 1 tablet (10 mg total) by mouth daily., Disp: 90 tablet, Rfl: 3   finasteride  (PROSCAR ) 5 MG tablet, TAKE ONE (1) TABLET EACH DAY, Disp: 90 tablet, Rfl: 3   fluticasone  (FLONASE ) 50 MCG/ACT nasal spray, Place 1 spray into both nostrils daily as needed for allergies or rhinitis., Disp: , Rfl:    furosemide  (LASIX ) 20 MG tablet, Take 1 tablet (20 mg total)  by mouth daily., Disp: 90 tablet, Rfl: 3   lovastatin  (MEVACOR ) 40 MG tablet, Take 1 tablet (40 mg total) by mouth at bedtime., Disp: 90 tablet, Rfl: 3   methylphenidate  (RITALIN ) 5 MG tablet, TAKE 1 TABLET BY MOUTH DAILY AS NEEDED, Disp: 30 tablet, Rfl: 0   metoprolol  tartrate (LOPRESSOR ) 50 MG tablet, Take 1 tablet (50 mg total) by mouth 2 (two) times daily., Disp: 180 tablet, Rfl: 3   montelukast  (SINGULAIR ) 10 MG tablet, Take 1 tablet (10 mg total) by mouth at bedtime., Disp: 90 tablet, Rfl: 3   PEMBROLIZUMAB  IV, Inject into the vein every 21 ( twenty-one) days., Disp: , Rfl:    predniSONE  (DELTASONE ) 5 MG tablet, Take 1 tablet (5 mg total) by mouth daily with breakfast., Disp: 90 tablet, Rfl: 2   warfarin (COUMADIN ) 5 MG tablet, TAKE 1 TABLET ON SAT, SUN, TUES, & THURSTAKE 1/2 TABLET ON MON, WED, & FRIDAY, Disp: 70 tablet, Rfl: 3   Allergies: Allergies  Allergen Reactions   Penicillins Hives and Rash    Has patient had a PCN reaction causing immediate rash, facial/tongue/throat swelling, SOB or lightheadedness with hypotension: Yes Has patient had a PCN reaction causing severe rash involving mucus membranes or skin necrosis: Yes Has patient had a PCN reaction that required hospitalization: No Has patient had a PCN reaction occurring within the last 10 years: No If all of the above answers are NO, then may proceed with Cephalosporin use.    Hct [Hydrochlorothiazide] Other (See Comments)    hyper   Statins Other (See Comments)    Myalgia, tolerates low dosages of lovastatin      REVIEW OF SYSTEMS:   Review of Systems  Constitutional:  Negative for chills, fatigue and fever.  HENT:   Negative for lump/mass, mouth sores, nosebleeds, sore throat and trouble swallowing.   Eyes:  Negative for eye problems.  Respiratory:  Negative for cough and shortness of breath.   Cardiovascular:  Negative for chest pain, leg swelling and palpitations.  Gastrointestinal:  Negative for abdominal  pain, constipation, diarrhea, nausea and vomiting.  Genitourinary:  Negative for bladder incontinence, difficulty urinating, dysuria, frequency, hematuria and nocturia.   Musculoskeletal:  Positive for arthralgias and back pain. Negative for flank pain, myalgias and neck pain.  Skin:  Negative for itching and rash.  Neurological:  Positive for dizziness and numbness. Negative for headaches.  Hematological:  Does not bruise/bleed easily.  Psychiatric/Behavioral:  Positive for sleep disturbance. Negative for depression and suicidal ideas. The patient is not nervous/anxious.   All other systems reviewed and are negative.    VITALS:   There were no vitals taken for this visit.  Wt  Readings from Last 3 Encounters:  10/18/23 187 lb 3.2 oz (84.9 kg)  09/27/23 186 lb 14.4 oz (84.8 kg)  09/16/23 186 lb (84.4 kg)    There is no height or weight on file to calculate BMI.  Performance status (ECOG): 1 - Symptomatic but completely ambulatory  PHYSICAL EXAM:   Physical Exam Vitals and nursing note reviewed. Exam conducted with a chaperone present.  Constitutional:      Appearance: Normal appearance.  Cardiovascular:     Rate and Rhythm: Normal rate and regular rhythm.     Pulses: Normal pulses.     Heart sounds: Normal heart sounds.  Pulmonary:     Effort: Pulmonary effort is normal.     Breath sounds: Normal breath sounds.  Abdominal:     Palpations: Abdomen is soft. There is no hepatomegaly, splenomegaly or mass.     Tenderness: There is no abdominal tenderness.  Musculoskeletal:     Right lower leg: No edema.     Left lower leg: No edema.  Lymphadenopathy:     Cervical: No cervical adenopathy.     Right cervical: No superficial, deep or posterior cervical adenopathy.    Left cervical: No superficial, deep or posterior cervical adenopathy.     Upper Body:     Right upper body: No supraclavicular or axillary adenopathy.     Left upper body: No supraclavicular or axillary adenopathy.   Neurological:     General: No focal deficit present.     Mental Status: He is alert and oriented to person, place, and time.  Psychiatric:        Mood and Affect: Mood normal.        Behavior: Behavior normal.     LABS:      Latest Ref Rng & Units 10/18/2023   11:48 AM 09/27/2023   11:23 AM 08/30/2023    9:55 AM  CBC  WBC 4.0 - 10.5 K/uL 17.2  13.7  14.7   Hemoglobin 13.0 - 17.0 g/dL 87.1  87.3  87.0   Hematocrit 39.0 - 52.0 % 40.1  38.5  40.0   Platelets 150 - 400 K/uL 207  231  205       Latest Ref Rng & Units 10/18/2023   11:48 AM 09/27/2023   11:23 AM 08/30/2023    9:55 AM  CMP  Glucose 70 - 99 mg/dL 884  91  882   BUN 8 - 23 mg/dL 22  20  19    Creatinine 0.61 - 1.24 mg/dL 8.99  9.03  8.90   Sodium 135 - 145 mmol/L 138  140  140   Potassium 3.5 - 5.1 mmol/L 3.8  3.7  3.6   Chloride 98 - 111 mmol/L 105  104  105   CO2 22 - 32 mmol/L 27  26  28    Calcium  8.9 - 10.3 mg/dL 8.8  9.0  9.0   Total Protein 6.5 - 8.1 g/dL 6.8  7.0  7.1   Total Bilirubin 0.0 - 1.2 mg/dL 0.5  0.5  0.6   Alkaline Phos 38 - 126 U/L 57  62  59   AST 15 - 41 U/L 17  16  17    ALT 0 - 44 U/L 19  12  13       Lab Results  Component Value Date   CEA1 5.4 (H) 06/03/2022   /  CEA  Date Value Ref Range Status  06/03/2022 5.4 (H) 0.0 - 4.7 ng/mL Final  Comment:    (NOTE)                             Nonsmokers          <3.9                             Smokers             <5.6 Roche [employer]ostics Electrochemiluminescence Immunoassay (ECLIA) Values obtained with different assay methods or kits cannot be used interchangeably.  Results cannot be interpreted as absolute evidence of the presence or absence of malignant disease. Performed At: [facility] [address] [place], [place] [id number] Jennette Shorter MD Ey:[id number]    Lab Results  Component Value Date   PSA1 11.3 (H) 02/16/2022   Lab Results  Component Value Date   CAN199 63 (H) 06/03/2022   No results found for:  CAN125  No results found for: TOTALPROTELP, ALBUMINELP, A1GS, A2GS, BETS, BETA2SER, GAMS, MSPIKE, SPEI No results found for: TIBC, FERRITIN, IRONPCTSAT Lab Results  Component Value Date   LDH 164 06/03/2022     STUDIES:   CT CHEST ABDOMEN PELVIS W CONTRAST Result Date: 10/18/2023 CLINICAL DATA:  Right supraclavicular adenopathy multiple lung nodules. Head and neck metastatic disease evaluation. * Tracking Code: BO * EXAM: CT CHEST, ABDOMEN, AND PELVIS WITH CONTRAST TECHNIQUE: Multidetector CT imaging of the chest, abdomen and pelvis was performed following the standard protocol during bolus administration of intravenous contrast. RADIATION DOSE REDUCTION: This exam was performed according to the departmental dose-optimization program which includes automated exposure control, adjustment of the mA and/or kV according to patient size and/or use of iterative reconstruction technique. CONTRAST:  OMNIPAQUE  IOHEXOL  300 MG/ML  SOLN COMPARISON:  07/16/2023. FINDINGS: CT CHEST FINDINGS Cardiovascular: Left IJ Port-A-Cath terminates in the SVC. Atherosclerotic calcification of the aorta and aortic valve. Enlarged pulmonic trunk and heart. No pericardial effusion. Mediastinum/Nodes: Thoracic inlet lymph nodes measure up to 1.5 cm on the right previously 1.2 cm. Posterior right supraclavicular lymph node measures 1.4 cm (3/1). Mediastinal lymph nodes have enlarged and appear somewhat necrotic, measuring up to 2.4 cm in the low right paratracheal station, previously 1.8 cm. Previously measured right hilar nodal mass is not well appreciated. No left axillary adenopathy. Right axillary lymph nodes have enlarged slightly, measuring up to 14 mm (3/26), previously 11 mm. Esophagus is grossly unremarkable. Lungs/Pleura: Small area of irregular subpleural consolidation in the lateral right upper lobe (4/54), nonspecific and unchanged. Basilar predominant subpleural reticulation, ground-glass  and mild bronchiolectasis. 4 mm subpleural anterior left upper lobe nodule (4/40), stable. No new pulmonary nodules. No pleural fluid. Airway is unremarkable. Musculoskeletal: Degenerative changes in the spine.  Osteopenia CT ABDOMEN PELVIS FINDINGS Hepatobiliary: Liver margin is irregular. Gallstones. No biliary ductal dilatation. Pancreas: Negative. Spleen: Negative. Adrenals/Urinary Tract: Adrenal glands are unremarkable. Low-attenuation lesions in the kidneys. No specific follow-up necessary. 1.4 cm hyperdense lesion in the lower pole right kidney measures 54 Hounsfield units on delayed imaging (1/26). Ureters are decompressed. Bladder is largely obscured by streak artifact bilateral hip arthroplasties. Stomach/Bowel: Moderate hiatal hernia. Duodenal diverticulum is incidentally noted. Stomach, small bowel and colon are otherwise unremarkable. Appendix is not readily identified. Vascular/Lymphatic: Atherosclerotic calcification of the aorta. No pathologically enlarged lymph nodes. Associated luminal narrowing of the superior mesenteric artery. Reproductive: Enlarged prostate 1. Enlarged prostate.  Is largely obscured by streak artifact from bilateral hip  arthroplasties. Other: Small bilateral inguinal hernias contain fat. No free fluid. Mesenteries and peritoneum are unremarkable. Musculoskeletal: Bilateral hip arthroplasties. Degenerative changes in the spine. IMPRESSION: 1. Progressive low neck, mediastinal and right axillary adenopathy, compatible with metastatic disease progression. 2. Nonspecific area of minimal subpleural consolidation in the lateral right upper lobe, unchanged. Recommend continued attention on follow-up. 3. Pulmonary fibrosis, possibly fibrotic nonspecific interstitial pneumonitis or usual interstitial pneumonitis. 4. Hyperdense 1.4 cm lesion in the lower pole right kidney cannot be characterized as a simple cyst. This can be reassessed on routine follow-up imaging. If a more aggressive  approach is desired, Further evaluation with pre and post contrast MRI should be considered. Pre and post contrast CT could alternatively be performed, but would likely be of decreased accuracy given lesion size. 5. Cirrhosis. 6. Moderate hiatal hernia. 7. Aortic atherosclerosis (ICD10-I70.0). Associated luminal narrowing of the superior mesenteric artery. 8. Enlarged pulmonic trunk, indicative of pulmonary arterial hypertension. Electronically Signed   By: Newell Eke M.D.   On: 10/18/2023 13:36   CT SOFT TISSUE NECK W CONTRAST Result Date: 10/18/2023 CLINICAL DATA:  Head/neck cancer, monitor EXAM: CT NECK WITH CONTRAST TECHNIQUE: Multidetector CT imaging of the neck was performed using the standard protocol following the bolus administration of intravenous contrast. RADIATION DOSE REDUCTION: This exam was performed according to the departmental dose-optimization program which includes automated exposure control, adjustment of the mA and/or kV according to patient size and/or use of iterative reconstruction technique. CONTRAST:  OMNIPAQUE  IOHEXOL  300 MG/ML  SOLN COMPARISON:  Neck CT 04/21/23 FINDINGS: Pharynx and larynx: Bilateral tonsils are normal in appearance. The epiglottis is normal in appearance. No evidence of retropharyngeal effusion. Salivary glands: No inflammation, mass, or stone. Thyroid : Normal. Lymph nodes: Compared to prior exam there is new right level 5 lymphadenopathy measuring up to 1.3 cm (series 8, image 49). There is also a new enlarged right level 4 lymph node measuring up to 1.5 cm (series 8, image 58). There is a left level 3 lymph node measuring up to 1.6 cm (series 8, image 44) and left level 4 lymph nodes measuring up to 1.0 cm (series 8, image 56). Vascular: Calcified atherosclerotic plaque of the carotid bifurcations bilaterally. Limited intracranial: Negative. Visualized orbits: Bilateral lens replacement. Orbits are otherwise unremarkable. Mastoids and visualized  paranasal sinuses: No ear or mastoid effusion. There is pansinus mucosal thickening with air-fluid levels in the right maxillary and bilateral sphenoid sinuses, which can be seen in the setting of acute sinusitis. Skeleton: Status post median sternotomy. No acute fracture. No focal bone lesion. Patient is edentulous. Unchanged lucent lesion in the C2 spinous process Upper chest: See separately dictated CT chest abdomen and pelvis for findings below the thoracic inlet, including characterization of mediastinal lymphadenopathy in a solid pulmonary nodule in the peripheral aspect of the right upper lobe measuring 1.3 cm. Left-sided chest port in place. Other: None. IMPRESSION: 1. New bilateral lower cervical lymphadenopathy, as above. Findings are concerning for metastatic disease. 2. Pansinus mucosal thickening with air-fluid levels in the right maxillary and bilateral sphenoid sinuses, which can be seen in the setting of acute sinusitis. 3. See separately dictated CT chest abdomen and pelvis for findings below the thoracic inlet. Electronically Signed   By: Lyndall Gore M.D.   On: 10/18/2023 13:34

## 2023-10-18 NOTE — Progress Notes (Signed)
 DISCONTINUE OFF PATHWAY REGIMEN - Head and Neck   [id number]:Carboplatin  AUC=5 IV D1 + Paclitaxel  175 mg/m2 IV D1 + Pembrolizumab  200 mg IV D1 q21 Days x 6 Cycles Followed by Pembrolizumab  200 mg IV D1 q21 Days:   Cycles 1 through 6: A cycle is every 21 days:     Pembrolizumab       Paclitaxel       Carboplatin     Cycles 7 and beyond: A cycle is every 21 days:     Pembrolizumab    **Always confirm dose/schedule in your pharmacy ordering system**  PRIOR TREATMENT: Off Pathway: Carboplatin  AUC=5 IV D1 + Paclitaxel  175 mg/m2 IV D1 + Pembrolizumab  200 mg IV D1 q21 Days x 6 Cycles Followed by Pembrolizumab  200 mg IV D1 q21 Days  START ON PATHWAY REGIMEN - Head and Neck     A cycle is every 14 days:     Cetuximab    **Always confirm dose/schedule in your pharmacy ordering system**  Patient Characteristics: Occult Primary, Metastatic, Second Line, Not a Candidate for Immunotherapy, BRAF Negative/Unknown and RET Negative/Unknown and HER2 Negative/Unknown/Equivocal Disease Classification: Occult Primary Current Disease Status: Metastatic Disease AJCC T Category: T0 AJCC N Category: pN3b AJCC M Category: M1 AJCC Stage Grouping: IVC Line of Therapy: Second Line HER2 Expression Status by IHC: Awaiting Test Results RET Gene Fusion Status: Negative BRAF [id number] Mutation Status: Negative Intent of Therapy: Non-Curative / Palliative Intent, Discussed with Patient

## 2023-10-18 NOTE — Patient Instructions (Signed)
 [facility] Discharge Instructions   You were seen and examined today by Dr. Rogers.  He reviewed the results of your lab work which are normal/stable.   He reviewed the results of your CT scan which is showing the lymph nodes are getting bigger. Dr. MARLA recommends switching treatment to a medication called Erbitux . This is an infusion given in the clinic weekly initially, then we can bump it out to every 2 weeks. We will plan to start this next week.   Return as scheduled.    Thank you for choosing [facility] at [facility] to provide your oncology and hematology care.  To afford each patient quality time with our provider, please arrive at least 15 minutes before your scheduled appointment time.   If you have a lab appointment with the Cancer Center please come in thru the Main Entrance and check in at the main information desk.  You need to re-schedule your appointment should you arrive 10 or more minutes late.  We strive to give you quality time with our providers, and arriving late affects you and other patients whose appointments are after yours.  Also, if you no show three or more times for appointments you may be dismissed from the clinic at the providers discretion.     Again, thank you for choosing [facility].  Our hope is that these requests will decrease the amount of time that you wait before being seen by our physicians.       _____________________________________________________________  Should you have questions after your visit to [facility], please contact our office at [phone] and follow the prompts.  Our office hours are 8:00 a.m. and 4:30 p.m. Monday - Friday.  Please note that voicemails left after 4:00 p.m. may not be returned until the following business day.  We are closed weekends and major holidays.  You do have access to a nurse 24-7, just call the main number to the  clinic [phone] and do not press any options, hold on the line and a nurse will answer the phone.    For prescription refill requests, have your pharmacy contact our office and allow 72 hours.    Due to Covid, you will need to wear a mask upon entering the hospital. If you do not have a mask, a mask will be given to you at the Main Entrance upon arrival. For doctor visits, patients may have 1 support person age 24 or older with them. For treatment visits, patients can not have anyone with them due to social distancing guidelines and our immunocompromised population.

## 2023-10-18 NOTE — Progress Notes (Signed)
 Hold treatment today per Dr. Ellin Saba due to progression.  Patient left ambulatory with wife in stable condition.

## 2023-10-18 NOTE — Progress Notes (Signed)
 Patients port flushed without difficulty.  Good blood return noted with no bruising or swelling noted at site.  Patient remains accessed for treatment.

## 2023-10-19 ENCOUNTER — Other Ambulatory Visit: Payer: Self-pay | Admitting: Family Medicine

## 2023-10-19 DIAGNOSIS — I4819 Other persistent atrial fibrillation: Secondary | ICD-10-CM

## 2023-10-19 LAB — SURGICAL PATHOLOGY

## 2023-10-22 ENCOUNTER — Encounter: Payer: Self-pay | Admitting: Hematology

## 2023-10-22 NOTE — Progress Notes (Signed)
 Pharmacist Chemotherapy Monitoring - Initial Assessment    Anticipated start date: 10/25/2023   The following has been reviewed per standard work regarding the patient's treatment regimen: The patient's diagnosis, treatment plan and drug doses, and organ/hematologic function Lab orders and baseline tests specific to treatment regimen  The treatment plan start date, drug sequencing, and pre-medications Prior authorization status  Patient's documented medication list, including drug-drug interaction screen and prescriptions for anti-emetics and supportive care specific to the treatment regimen The drug concentrations, fluid compatibility, administration routes, and timing of the medications to be used The patient's access for treatment and lifetime cumulative dose history, if applicable  The patient's medication allergies and previous infusion related reactions, if applicable   Changes made to treatment plan:  N/A  Follow up needed:  N/A   [name], [facility], 10/22/2023  11:21 AM

## 2023-10-25 ENCOUNTER — Ambulatory Visit: Payer: Medicare Other

## 2023-10-25 ENCOUNTER — Inpatient Hospital Stay: Payer: Medicare Other

## 2023-10-25 ENCOUNTER — Other Ambulatory Visit: Payer: Medicare Other

## 2023-10-25 VITALS — BP 153/88 | HR 79 | Temp 97.4°F | Resp 19 | Wt 185.0 lb

## 2023-10-25 DIAGNOSIS — C4442 Squamous cell carcinoma of skin of scalp and neck: Secondary | ICD-10-CM

## 2023-10-25 DIAGNOSIS — Z801 Family history of malignant neoplasm of trachea, bronchus and lung: Secondary | ICD-10-CM | POA: Diagnosis not present

## 2023-10-25 DIAGNOSIS — Z5111 Encounter for antineoplastic chemotherapy: Secondary | ICD-10-CM | POA: Diagnosis not present

## 2023-10-25 DIAGNOSIS — Z79620 Long term (current) use of immunosuppressive biologic: Secondary | ICD-10-CM | POA: Diagnosis not present

## 2023-10-25 DIAGNOSIS — Z95828 Presence of other vascular implants and grafts: Secondary | ICD-10-CM

## 2023-10-25 DIAGNOSIS — C801 Malignant (primary) neoplasm, unspecified: Secondary | ICD-10-CM | POA: Diagnosis not present

## 2023-10-25 DIAGNOSIS — Z87891 Personal history of nicotine dependence: Secondary | ICD-10-CM | POA: Diagnosis not present

## 2023-10-25 DIAGNOSIS — D72820 Lymphocytosis (symptomatic): Secondary | ICD-10-CM | POA: Diagnosis not present

## 2023-10-25 DIAGNOSIS — R530 Neoplastic (malignant) related fatigue: Secondary | ICD-10-CM | POA: Diagnosis not present

## 2023-10-25 DIAGNOSIS — C778 Secondary and unspecified malignant neoplasm of lymph nodes of multiple regions: Secondary | ICD-10-CM | POA: Diagnosis not present

## 2023-10-25 LAB — CBC WITH DIFFERENTIAL/PLATELET
Abs Immature Granulocytes: 0.1 10*3/uL — ABNORMAL HIGH (ref 0.00–0.07)
Basophils Absolute: 0.1 10*3/uL (ref 0.0–0.1)
Basophils Relative: 0 %
Eosinophils Absolute: 0.5 10*3/uL (ref 0.0–0.5)
Eosinophils Relative: 3 %
HCT: 40.3 % (ref 39.0–52.0)
Hemoglobin: 13.1 g/dL (ref 13.0–17.0)
Immature Granulocytes: 1 %
Lymphocytes Relative: 27 %
Lymphs Abs: 4.2 10*3/uL — ABNORMAL HIGH (ref 0.7–4.0)
MCH: 33.5 pg (ref 26.0–34.0)
MCHC: 32.5 g/dL (ref 30.0–36.0)
MCV: 103.1 fL — ABNORMAL HIGH (ref 80.0–100.0)
Monocytes Absolute: 1 10*3/uL (ref 0.1–1.0)
Monocytes Relative: 7 %
Neutro Abs: 9.6 10*3/uL — ABNORMAL HIGH (ref 1.7–7.7)
Neutrophils Relative %: 62 %
Platelets: 192 10*3/uL (ref 150–400)
RBC: 3.91 MIL/uL — ABNORMAL LOW (ref 4.22–5.81)
RDW: 13.3 % (ref 11.5–15.5)
WBC: 15.4 10*3/uL — ABNORMAL HIGH (ref 4.0–10.5)
nRBC: 0 % (ref 0.0–0.2)

## 2023-10-25 LAB — BASIC METABOLIC PANEL
Anion gap: 7 (ref 5–15)
BUN: 24 mg/dL — ABNORMAL HIGH (ref 8–23)
CO2: 27 mmol/L (ref 22–32)
Calcium: 9 mg/dL (ref 8.9–10.3)
Chloride: 103 mmol/L (ref 98–111)
Creatinine, Ser: 1.01 mg/dL (ref 0.61–1.24)
GFR, Estimated: >60 mL/min (ref >60)
Glucose, Bld: 104 mg/dL — ABNORMAL HIGH (ref 70–99)
Potassium: 3.4 mmol/L — ABNORMAL LOW (ref 3.5–5.1)
Sodium: 137 mmol/L (ref 135–145)

## 2023-10-25 LAB — MAGNESIUM: Magnesium: 2 mg/dL (ref 1.7–2.4)

## 2023-10-25 MED ORDER — SODIUM CHLORIDE 0.9 % IV SOLN: Freq: Once | INTRAVENOUS | Status: DC | PRN

## 2023-10-25 MED ORDER — ALBUTEROL SULFATE (2.5 MG/3ML) 0.083% IN NEBU: 2.5000 mg | INHALATION_SOLUTION | Freq: Once | RESPIRATORY_TRACT | Status: DC | PRN

## 2023-10-25 MED ORDER — FAMOTIDINE IN NACL 20-0.9 MG/50ML-% IV SOLN
20.0000 mg | Freq: Once | INTRAVENOUS | Status: AC
Start: 2023-10-25 — End: 2023-10-25
  Administered 2023-10-25: 20 mg via INTRAVENOUS
  Filled 2023-10-25: qty 50

## 2023-10-25 MED ORDER — HEPARIN SOD (PORK) LOCK FLUSH 100 UNIT/ML IV SOLN
500.0000 [IU] | Freq: Once | INTRAVENOUS | Status: AC | PRN
  Administered 2023-10-25: 500 [IU]

## 2023-10-25 MED ORDER — METHYLPREDNISOLONE SODIUM SUCC 125 MG IJ SOLR
125.0000 mg | Freq: Once | INTRAMUSCULAR | Status: AC | PRN
  Administered 2023-10-25: 125 mg via INTRAVENOUS

## 2023-10-25 MED ORDER — SODIUM CHLORIDE 0.9 % IV SOLN: INTRAVENOUS | Status: DC

## 2023-10-25 MED ORDER — POTASSIUM CHLORIDE CRYS ER 20 MEQ PO TBCR
40.0000 meq | EXTENDED_RELEASE_TABLET | Freq: Once | ORAL | Status: AC
  Administered 2023-10-25: 40 meq via ORAL
  Filled 2023-10-25: qty 2

## 2023-10-25 MED ORDER — SODIUM CHLORIDE 0.9% FLUSH
10.0000 mL | INTRAVENOUS | Status: DC | PRN
  Administered 2023-10-25 (×2): 10 mL via INTRAVENOUS

## 2023-10-25 MED ORDER — METHYLPREDNISOLONE SODIUM SUCC 125 MG IJ SOLR
125.0000 mg | Freq: Once | INTRAMUSCULAR | Status: AC
  Administered 2023-10-25: 125 mg via INTRAVENOUS
  Filled 2023-10-25: qty 2

## 2023-10-25 MED ORDER — ALBUTEROL SULFATE (2.5 MG/3ML) 0.083% IN NEBU
2.5000 mg | INHALATION_SOLUTION | Freq: Once | RESPIRATORY_TRACT | Status: AC
  Administered 2023-10-25: 2.5 mg via RESPIRATORY_TRACT

## 2023-10-25 MED ORDER — DIPHENHYDRAMINE HCL 50 MG/ML IJ SOLN
50.0000 mg | Freq: Once | INTRAMUSCULAR | Status: AC
  Administered 2023-10-25: 50 mg via INTRAVENOUS
  Filled 2023-10-25: qty 1

## 2023-10-25 MED ORDER — CETUXIMAB CHEMO IV INJECTION 200 MG/100ML
400.0000 mg/m2 | Freq: Once | INTRAVENOUS | Status: AC
  Administered 2023-10-25: 800 mg via INTRAVENOUS
  Filled 2023-10-25: qty 400

## 2023-10-25 NOTE — Patient Instructions (Signed)
 [facility] [place] - A DEPT OF [facility]  Discharge Instructions: Thank you for choosing [facility] to provide your oncology and hematology care.  If you have a lab appointment with the Cancer Center - please note that after April 8th, 2024, all labs will be drawn in the cancer center.  You do not have to check in or register with the main entrance as you have in the past but will complete your check-in in the cancer center.  Wear comfortable clothing and clothing appropriate for easy access to any Portacath or PICC line.   We strive to give you quality time with your provider. You may need to reschedule your appointment if you arrive late (15 or more minutes).  Arriving late affects you and other patients whose appointments are after yours.  Also, if you miss three or more appointments without notifying the office, you may be dismissed from the clinic at the provider's discretion.      For prescription refill requests, have your pharmacy contact our office and allow 72 hours for refills to be completed.    Today you received the following chemotherapy and/or immunotherapy agents erbitux      To help prevent nausea and vomiting after your treatment, we encourage you to take your nausea medication as directed.  BELOW ARE SYMPTOMS THAT SHOULD BE REPORTED IMMEDIATELY: *FEVER GREATER THAN 100.4 F (38 C) OR HIGHER *CHILLS OR SWEATING *NAUSEA AND VOMITING THAT IS NOT CONTROLLED WITH YOUR NAUSEA MEDICATION *UNUSUAL SHORTNESS OF BREATH *UNUSUAL BRUISING OR BLEEDING *URINARY PROBLEMS (pain or burning when urinating, or frequent urination) *BOWEL PROBLEMS (unusual diarrhea, constipation, pain near the anus) TENDERNESS IN MOUTH AND THROAT WITH OR WITHOUT PRESENCE OF ULCERS (sore throat, sores in mouth, or a toothache) UNUSUAL RASH, SWELLING OR PAIN  UNUSUAL VAGINAL DISCHARGE OR ITCHING   Items with * indicate a potential emergency and should be followed up  as soon as possible or go to the Emergency Department if any problems should occur.  Please show the CHEMOTHERAPY ALERT CARD or IMMUNOTHERAPY ALERT CARD at check-in to the Emergency Department and triage nurse.  Should you have questions after your visit or need to cancel or reschedule your appointment, please contact [facility] [place] - A DEPT OF [facility] [phone]  and follow the prompts.  Office hours are 8:00 a.m. to 4:30 p.m. Monday - Friday. Please note that voicemails left after 4:00 p.m. may not be returned until the following business day.  We are closed weekends and major holidays. You have access to a nurse at all times for urgent questions. Please call the main number to the clinic [phone] and follow the prompts.  For any non-urgent questions, you may also contact your provider using MyChart. We now offer e-Visits for anyone 7 and older to request care online for non-urgent symptoms. For details visit [email or web address].   Also download the MyChart app! Go to the app store, search "MyChart", open the app, select , and log in with your MyChart username and password.

## 2023-10-25 NOTE — Progress Notes (Signed)
 Patients port flushed without difficulty.  Good blood return noted with no bruising or swelling noted at site.  Patient remains accessed for treatment.

## 2023-10-25 NOTE — Progress Notes (Signed)
 Patient tolerated the remainder of treatment well with no complaints voiced.  Patient left via wheelchair with wife in stable condition.  Vital signs stable at discharge.  Follow up as scheduled.

## 2023-10-25 NOTE — Progress Notes (Signed)
   Hypersensitivity Reaction note  Date of event: 10/25/23 Time of event: 1309 Generic name of drug involved: Cetuximab   Name of provider notified of the hypersensitivity reaction: Dr. Katragadda Was agent that likely caused hypersensitivity reaction added to Allergies List within EMR? Yes  Chain of events including reaction signs/symptoms, treatment administered, and outcome (e.g., drug resumed; drug discontinued; sent to Emergency Department; etc.)   Cetuximab  started per protocol at 1232 at 1309 pt express to RN that his throat feels scratchy and pt is starting to cough. Cetuximab  stopped, vitals taken and stable, NS bolus started.  **See flowsheet for vitals** 1312-Dr Katragadda at chairside to assess pt. Per MD to give pt albuterol  nebulizer and solumedrol 125 mg IV at this time. Solumedrol and albuterol  finished at 1330. Per pt his coughing is better but still feels like his throat is a little scratchy and slightly dizzy after the solumedrol dose. Vitals stable. MD made aware. Per MD to proceed with re challenging Cetuximab . Cetuximab  finished at 1535. Vitals stable. Per pt he feels fine and feels like his cough/throat are better. Per policy pt is to be observed for 1 hour post Cetuximab , pt agrees to stay for observation. pt stable   Tinnie Canary, RN 10/25/2023 4:15 PM

## 2023-10-25 NOTE — Progress Notes (Signed)
 Dose frequency confirmed to be:  Erbitux 400 mg/m2 q 2 weeks, if tolerates MD may increase to 500 mg/m2 q 2 weeks.  V.O. Dr Carilyn Goodpasture, PharmD

## 2023-10-28 ENCOUNTER — Ambulatory Visit (INDEPENDENT_AMBULATORY_CARE_PROVIDER_SITE_OTHER): Payer: Medicare Other | Admitting: Family Medicine

## 2023-10-28 ENCOUNTER — Encounter: Payer: Self-pay | Admitting: Family Medicine

## 2023-10-28 VITALS — BP 137/83 | HR 85 | Ht 66.5 in | Wt 185.0 lb

## 2023-10-28 DIAGNOSIS — Z7901 Long term (current) use of anticoagulants: Secondary | ICD-10-CM | POA: Diagnosis not present

## 2023-10-28 DIAGNOSIS — I4819 Other persistent atrial fibrillation: Secondary | ICD-10-CM

## 2023-10-28 LAB — COAGUCHEK XS/INR WAIVED
INR: 4.2 — ABNORMAL HIGH (ref 0.9–1.1)
Prothrombin Time: 50 s

## 2023-10-28 MED ORDER — FINASTERIDE 5 MG PO TABS: ORAL_TABLET | ORAL | 3 refills | Status: DC

## 2023-10-28 MED ORDER — MONTELUKAST SODIUM 10 MG PO TABS: 10.0000 mg | ORAL_TABLET | Freq: Every day | ORAL | 3 refills | Status: AC

## 2023-10-28 MED ORDER — FUROSEMIDE 20 MG PO TABS: 20.0000 mg | ORAL_TABLET | Freq: Every day | ORAL | 3 refills | Status: DC

## 2023-10-28 NOTE — Progress Notes (Addendum)
BP 137/83   Pulse 85   Ht 5' 6.5" (1.689 m)   Wt 185 lb (83.9 kg)   SpO2 96%   BMI 29.41 kg/m    Subjective:   Patient ID: [name], male    DOB: 1932-06-01, 89 y.o.   MRN: [id number]  HPI: [name] is a 89 y.o. male presenting on 10/28/2023 for Medical Management of Chronic Issues and Atrial Fibrillation   Atrial Fibrillation Symptoms include dizziness. Symptoms are negative for chest pain, palpitations, shortness of breath and weakness. Past medical history includes atrial fibrillation.   Coumadin recheck: Target goal is 2.0-3.0. Reason for anticoagulation is Atrial Fibrillation. Started new Chemotherapy medication, CETUXIMAB,  3 days ago. Small cough he feels is from new chemo medication, but denies blood in sputum. Has experienced dizziness with last time this AM, but denies any falls. Denies any blood in urine or stool. Denies any headaches or weakness. Denies any chest pain, palpitations or SOB.   Relevant past medical, surgical, family and social history reviewed and updated as indicated. Interim medical history since our last visit reviewed. Allergies and medications reviewed and updated.  Review of Systems  Constitutional:  Negative for chills and fever.  Respiratory:  Positive for cough and wheezing. Negative for shortness of breath.   Cardiovascular:  Negative for chest pain and palpitations.  Gastrointestinal:  Negative for anal bleeding and blood in stool.  Genitourinary:  Negative for hematuria.  Musculoskeletal:  Negative for back pain and gait problem.  Skin:  Negative for rash.  Neurological:  Positive for dizziness. Negative for weakness, numbness and headaches.  All other systems reviewed and are negative.   Per HPI unless specifically indicated above   Allergies as of 10/28/2023       Reactions   Penicillins Hives, Rash   Has patient had a PCN reaction causing immediate rash, facial/tongue/throat swelling, SOB or lightheadedness with  hypotension: Yes Has patient had a PCN reaction causing severe rash involving mucus membranes or skin necrosis: Yes Has patient had a PCN reaction that required hospitalization: No Has patient had a PCN reaction occurring within the last 10 years: No If all of the above answers are "NO", then may proceed with Cephalosporin use.   Cetuximab Cough   Cough and scratchy throat. Drug rechallenged and pt able to tolerated the rest of the infusion without any complications.    Hct [hydrochlorothiazide] Other (See Comments)   hyper   Statins Other (See Comments)   Myalgia, tolerates low dosages of lovastatin         Medication List        Accurate as of October 28, 2023  1:04 PM. If you have any questions, ask your nurse or doctor.          acetaminophen 500 MG tablet Commonly known as: TYLENOL Take 1,000 mg by mouth every 6 (six) hours as needed for moderate pain.   diltiazem 120 MG 24 hr capsule Commonly known as: CARDIZEM CD TAKE ONE (1) CAPSULE EACH DAY   doxycycline 100 MG tablet Commonly known as: VIBRA-TABS Take 1 tablet (100 mg total) by mouth 2 (two) times daily.   ezetimibe 10 MG tablet Commonly known as: ZETIA Take 1 tablet (10 mg total) by mouth daily.   finasteride 5 MG tablet Commonly known as: PROSCAR TAKE ONE (1) TABLET EACH DAY   fluticasone 50 MCG/ACT nasal spray Commonly known as: FLONASE Place 1 spray into both nostrils daily as needed for allergies or  rhinitis.   furosemide 20 MG tablet Commonly known as: LASIX Take 1 tablet (20 mg total) by mouth daily.   lovastatin 40 MG tablet Commonly known as: MEVACOR Take 1 tablet (40 mg total) by mouth at bedtime.   methylphenidate 5 MG tablet Commonly known as: RITALIN TAKE 1 TABLET BY MOUTH DAILY AS NEEDED   metoprolol tartrate 50 MG tablet Commonly known as: LOPRESSOR Take 1 tablet (50 mg total) by mouth 2 (two) times daily.   montelukast 10 MG tablet Commonly known as: SINGULAIR Take 1 tablet  (10 mg total) by mouth at bedtime.   predniSONE 5 MG tablet Commonly known as: DELTASONE Take 1 tablet (5 mg total) by mouth daily with breakfast.   Vitamin D3 125 MCG (5000 UT) Caps Take 5,000 Units by mouth once a week.   warfarin 5 MG tablet Commonly known as: COUMADIN Take as directed by the anticoagulation clinic. If you are unsure how to take this medication, talk to your nurse or doctor. Original instructions: TAKE 1 TABLET ON SATURDAY, SUNDAY, TUESDAY, & THURSDAY. TAKE 1/2 TABLET ON MONDAY, WEDNESDAY, & FRIDAY         Objective:   BP 137/83   Pulse 85   Ht 5' 6.5" (1.689 m)   Wt 185 lb (83.9 kg)   SpO2 96%   BMI 29.41 kg/m   Wt Readings from Last 3 Encounters:  10/28/23 185 lb (83.9 kg)  10/25/23 185 lb (83.9 kg)  10/18/23 187 lb 3.2 oz (84.9 kg)    Physical Exam Vitals reviewed.  Constitutional:      General: He is not in acute distress.    Appearance: Normal appearance. He is well-developed and well-groomed.  Cardiovascular:     Rate and Rhythm: Normal rate. Rhythm irregular.     Heart sounds: Normal heart sounds. No murmur heard. Pulmonary:     Effort: Pulmonary effort is normal. No respiratory distress.     Breath sounds: Normal breath sounds. No wheezing.  Skin:    General: Skin is warm and dry.     Capillary Refill: Capillary refill takes less than 2 seconds.     Findings: No bruising, erythema, lesion or rash.  Neurological:     Mental Status: He is alert and oriented to person, place, and time.  Psychiatric:        Behavior: Behavior normal.    INR results at today's visit 4.2 (goal 2.0-3.0)    Assessment & Plan:   Problem List Items Addressed This Visit       Cardiovascular and Mediastinum   Atrial fibrillation, persistent (HCC) - Primary   Relevant Medications   furosemide (LASIX) 20 MG tablet   Other Relevant Orders   CoaguChek XS/INR Waived     Other   Long term current use of anticoagulant therapy    Will decrease dosage of  COUMADIN (Warfarin) to 1/2 tablet (2.5mg ) daily due to INR levels.  Description   Hold for 1 days and then decrease dose to take 1/2 tablet every day.   INR today is  4.2 (goal is 2-3)    Recheck in 4-5 weeks      Follow up plan: Return in about 4 weeks (around 11/25/2023), or if symptoms worsen or fail to improve, for INR.  Counseling provided for all of the vaccine components Orders Placed This Encounter  Procedures   CoaguChek XS/INR Seabron Spates PA-S 10/28/2023, 1:04 PM  I was personally present for all components of the  history, physical exam and/or medical decision making.  I agree with the documentation performed by the student and agree with assessment and plan above.  Student was Ellene Route. Arville Care, MD [facility] 10/28/2023, 1:04 PM

## 2023-11-01 ENCOUNTER — Inpatient Hospital Stay: Payer: Medicare Other

## 2023-11-01 ENCOUNTER — Inpatient Hospital Stay: Payer: Medicare Other | Admitting: Hematology

## 2023-11-06 ENCOUNTER — Other Ambulatory Visit: Payer: Self-pay | Admitting: Family Medicine

## 2023-11-08 NOTE — Progress Notes (Signed)
[facility] [address] [place] [address]    Clinic Day:  11/09/2023  Referring physician: [name], Elige Radon, MD  Patient Care Team: [name], Elige Radon, MD as PCP - General (Family Medicine) [name], MD as PCP - Cardiology (Cardiology) Bjorn Pippin, MD as Attending Physician (Urology) [name], MD as Consulting Physician (Cardiology) [name], MD (Inactive) as Consulting Physician (Gastroenterology) [name], MD as Consulting Physician (Ophthalmology) [name], MD as Consulting Physician (Orthopedic Surgery) [name], [name], LCSW as [facility] Care Management (Licensed Clinical Social Worker) [name], MD as Medical Oncologist (Medical Oncology) [name], RN as Oncology Nurse Navigator (Medical Oncology)   ASSESSMENT & PLAN:   Assessment: 1.  Right supraclavicular lymphadenopathy and multiple lung nodules: - Patient noticed right neck mass since Christmas 2022.  10 to 15 pound weight loss since January 2023.  Denies any dysphagia or odynophagia. - CT neck (05/27/2022): Numerous lymph nodes in the right neck, right supraclavicular lymph node mass measures 5.7 x 3.7 cm.  18 mm posterior lymph node in the right lower neck.  Numerous lymph nodes in the right lower and posterior neck approximately 1 cm.  Many have internal necrosis consistent with metastatic disease.  20 mm lymph node just above the right clavicle.  Left level 2 node 12 mm. - CT CAP (05/28/2022): Several bilateral lung nodules, RUL nodule measuring 1.8 x 1.3 cm.  Small clusters of nodules in the medial left upper lobe.  Irregular nodular density along the left side of the mediastinum measuring 2.1 cm.  No metastatic disease in the abdomen or pelvis.  Liver is slightly nodular contour. - Lab work shows elevated white count since 2009, predominantly lymphocytosis. - Pathology (06/09/2022): Metastatic carcinoma positive for p40, p63  and CK5/6-patchy positivity with CK7.  Negative for TTF-1, Napsin A, PAX8, prostein, PSA, CK20 and CDX2.  - PET scan (06/11/2022): Bulky right supraclavicular nodal mass, smaller hypermetabolic right posterior triangle and jugular lymph nodes and single hypermetabolic left level 3 lymph node.  Bilateral hypermetabolic pulmonary nodules and metastatic pattern.  Minimal hypermetabolic mediastinal nodal metastasis.  Cluster of hypermetabolic right axillary lymph nodes.  No evidence of metastatic disease or primary lesion in the abdomen or pelvis.  No bone lesions. - MRI of the brain: Chronic small vessel ischemic changes with no evidence of metastatic disease. - NGS: T p53 pathogenic variant, MSI-stable, TMB-low, LOH-low, PD-L1 ([id number]): Negative, p16 and p18 negative.  HER2 by IHC 0. - PD-L1 22 C3: CPS score 25 - Cancer type ID: 96% probability squamous cell carcinoma, subtype head and neck/skin.  Cancer types ruled out with 95% confidence includes skin basal cell carcinoma. - Carboplatin, Taxol and pembrolizumab 07/23/2022 through 11/09/22 - Right axillary lymph node biopsy (08/04/2023): Metastatic moderate to poorly differentiated squamous cell carcinoma. - Cetuximab every 2 weeks started on 10/25/2023   2.  Social/family history: - He lives at home with his wife.  Independent of ADLs and IADLs.  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  May have had asbestos exposure 30 years ago.  Quit smoking cigarettes in 1965.  Smoked 1 pack/day for less than 12 years.  2 of his brothers had lung cancer and both were smokers.  3.  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    Plan: 1.  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 - Cetuximab every 2 weeks started on  10/25/2023. - He had reaction with cetuximab with cough and scratchy sensation in the throat.  We have given another dose of Solu-Medrol and albuterol inhaler.  He was able to complete the infusion after that. - Labs today: Normal BMP with mild hypokalemia.  CBC was normal. - We will give premedication with steroid, H1 and H2 blocker, albuterol inhaler today and proceed with cycle 2 at 500 mg/m.   2.  EGFR antibody induced rash: -Continue doxycycline twice daily.  He has some rash around nostrils.  Will prescribe clindamycin gel to be used twice daily.   3.  Cancer-related fatigue: - Continue Ritalin 5 mg in the mornings as needed.  Has not required since we started on low-dose prednisone.  4.  Polyarthralgias: - Continue prednisone 5 mg daily which is helping with polyarthralgias flared up from immunotherapy.    Orders Placed This Encounter  Procedures   Comprehensive metabolic panel    Standing Status:   Future    Expected Date:   11/22/2023    Expiration Date:   11/21/2024   Comprehensive metabolic panel    Standing Status:   Future    Expected Date:   12/06/2023    Expiration Date:   12/05/2024   Comprehensive metabolic panel    Standing Status:   Future    Expected Date:   12/20/2023    Expiration Date:   12/19/2024   Comprehensive metabolic panel    Standing Status:   Future    Expected Date:   01/03/2024    Expiration Date:   01/02/2025      Mikeal Hawthorne R [name],acting as a scribe for [name], MD.,have documented all relevant documentation on the behalf of [name], MD,as directed by  [name], MD while in the presence of [name], MD.  I, [name] MD, have reviewed the above documentation for accuracy and completeness, and I agree with the above.     [name], MD   1/28/202511:27 AM  CHIEF COMPLAINT:   Diagnosis: metastatic squamous cell carcinoma    Cancer Staging  Squamous cell carcinoma of head and  neck Staging form: Cervical Lymph Nodes and Unknown Primary Tumors of the Head and Neck, AJCC 8th Edition - Clinical stage from 07/16/2022: Stage IVC (cT0, cN3b, cM1) - Unsigned    Prior Therapy: Carboplatin, paclitaxel and pembrolizumab, 07/23/22 - 11/09/22   Current Therapy:  pembrolizumab    HISTORY OF PRESENT ILLNESS:   Oncology History  Squamous cell carcinoma of head and neck  07/16/2022 Initial Diagnosis   Squamous cell carcinoma of head and neck (HCC)   07/23/2022 - 09/27/2023 Chemotherapy   Patient is on Treatment Plan : Carboplatin  + Paclitaxel  + Pembrolizumab (200) D1 q21d     10/25/2023 -  Chemotherapy   Patient is on Treatment Plan : HEAD/NECK Cetuximab q14d        INTERVAL HISTORY:   Greg is a 89 y.o. male presenting to clinic today for follow up of metastatic squamous cell carcinoma. He was last seen by me on 10/18/23.  Today, he states that he is doing well overall. His appetite level is at 100%. His energy level is at 25%. He is accompanied by his wife.   He notes a normal appetite. He reports the day after treatment he reports sores on the alae regions of the nose and soreness on the  inner shell of the outer ears. He has been taking doxycyline as prescribed and OTC cream. He has been moisturizing with lotion often, and wearing a hood and mask when going out of the house.   He denies any breathing issues after treatment. He has been taking Singulair as prescribed. His wife notes he has decreased energy levels. He reports a history of prostate issues and pain in the pelvic region when he wakes up in the morning that resolves when he starts being active.   PAST MEDICAL HISTORY:   Past Medical History: Past Medical History:  Diagnosis Date   Anemia    Atrial fibrillation (HCC)    Cataract    Cellulitis    In past   Complication of anesthesia    HARD TIME WAKING; CAUSES MY BP TO GO UP    Coronary artery disease    5 bypasses   DJD (degenerative joint  disease)    Ejection fraction < 50%    Mildly reduced, 40% by echo   GERD (gastroesophageal reflux disease)    Hyperlipidemia    Hypertension    Persistent atrial fibrillation (HCC)    Prostate hypertrophy    on CT scan 09/2014   PUD (peptic ulcer disease)    RESOLVED    Skin cancer of eyelid    Resected    Surgical History: Past Surgical History:  Procedure Laterality Date   APPENDECTOMY     BYPASS GRAFT  2005   CATARACT EXTRACTION W/PHACO Left 07/22/2015   Procedure: CATARACT EXTRACTION PHACO AND INTRAOCULAR LENS PLACEMENT LEFT EYE CDE=8.14;  Surgeon: [name], MD;  Location: [facility];  Service: Ophthalmology;  Laterality: Left;   CATARACT EXTRACTION W/PHACO Right 08/18/2019   Procedure: CATARACT EXTRACTION PHACO AND INTRAOCULAR LENS PLACEMENT (IOC);  Surgeon: Fabio Pierce, MD;  Location: [facility];  Service: Ophthalmology;  Laterality: Right;  CDE: 9.74   CORONARY ARTERY BYPASS GRAFT  4/05   LIMA to LAD, SVG to PDA, SVG to ramus intermediate   EYE SURGERY  07/2015   EYELID CARCINOMA EXCISION     Skin cancer resected   Heart bypass  2005   LYMPH NODE BIOPSY Right 06/09/2022   Procedure: LYMPH NODE BIOPSY; supraclavicular;  Surgeon: Lucretia Roers, MD;  Location: [facility];  Service: General;  Laterality: Right;   PORTACATH PLACEMENT Left 06/09/2022   Procedure: INSERTION PORT-A-CATH;  Surgeon: Lucretia Roers, MD;  Location: [facility];  Service: General;  Laterality: Left;   TOTAL HIP ARTHROPLASTY     Right   TOTAL HIP ARTHROPLASTY Left 05/04/2018   Procedure: LEFT TOTAL HIP ARTHROPLASTY;  Surgeon: [name], MD;  Location: [facility];  Service: Orthopedics;  Laterality: Left;   TOTAL KNEE ARTHROPLASTY     Right    Social History: Social History   Socioeconomic History   Marital status: Married    Spouse name: Talbert Forest   Number of children: 1   Years of education: 10   Highest education level: 10th grade  Occupational History   Occupation: retired  Tobacco Use    Smoking status: Former    Current packs/day: 0.00    Average packs/day: 2.0 packs/day for 5.0 years (10.0 ttl pk-yrs)    Types: Cigarettes    Start date: 12/23/1958    Quit date: 12/23/1963    Years since quitting: 59.9   Smokeless tobacco: Never  Vaping Use   Vaping status: Never Used  Substance and Sexual Activity   Alcohol use: No   Drug use:  No   Sexual activity: Yes  Other Topics Concern   Not on file  Social History Narrative   Lives in a split level home.   Social Drivers of Corporate investment banker Strain: Low Risk  (10/25/2023)   Overall Financial Resource Strain (CARDIA)    Difficulty of Paying Living Expenses: Not hard at all  Food Insecurity: No Food Insecurity (10/25/2023)   Hunger Vital Sign    Worried About Running Out of Food in the Last Year: Never true    Ran Out of Food in the Last Year: Never true  Transportation Needs: No Transportation Needs (10/25/2023)   PRAPARE - Administrator, Civil Service (Medical): No    Lack of Transportation (Non-Medical): No  Physical Activity: Inactive (10/25/2023)   Exercise Vital Sign    Days of Exercise per Week: 1 day    Minutes of Exercise per Session: 0 min  Stress: No Stress Concern Present (10/25/2023)   [employer] of Occupational Health - Occupational Stress Questionnaire    Feeling of Stress : Only a little  Social Connections: Moderately Isolated (10/25/2023)   Social Connection and Isolation Panel [NHANES]    Frequency of Communication with Friends and Family: More than three times a week    Frequency of Social Gatherings with Friends and Family: Twice a week    Attends Religious Services: Never    Database administrator or Organizations: No    Attends Banker Meetings: Never    Marital Status: Married  Catering manager Violence: Not At Risk (01/06/2023)   Humiliation, Afraid, Rape, and Kick questionnaire    Fear of Current or Ex-Partner: No    Emotionally Abused: No     Physically Abused: No    Sexually Abused: No    Family History: Family History  Problem Relation Age of Onset   Stroke Mother    Stroke Father    Hypertension Father    Early death Brother        43 months old   Cancer Brother        lung   Stroke Brother        heat    Current Medications:  Current Outpatient Medications:    acetaminophen (TYLENOL) 500 MG tablet, Take 1,000 mg by mouth every 6 (six) hours as needed for moderate pain., Disp: , Rfl:    Cholecalciferol (VITAMIN D3) 5000 units CAPS, Take 5,000 Units by mouth once a week. , Disp: , Rfl:    clindamycin (CLINDAGEL) 1 % gel, Apply topically 2 (two) times daily., Disp: 30 g, Rfl: 3   diltiazem (CARDIZEM CD) 120 MG 24 hr capsule, TAKE ONE (1) CAPSULE EACH DAY, Disp: 90 capsule, Rfl: 3   doxycycline (VIBRA-TABS) 100 MG tablet, Take 1 tablet (100 mg total) by mouth 2 (two) times daily., Disp: 60 tablet, Rfl: 2   ezetimibe (ZETIA) 10 MG tablet, TAKE ONE (1) TABLET BY MOUTH EVERY DAY, Disp: 90 tablet, Rfl: 0   finasteride (PROSCAR) 5 MG tablet, TAKE ONE (1) TABLET EACH DAY, Disp: 90 tablet, Rfl: 3   fluticasone (FLONASE) 50 MCG/ACT nasal spray, Place 1 spray into both nostrils daily as needed for allergies or rhinitis., Disp: , Rfl:    furosemide (LASIX) 20 MG tablet, Take 1 tablet (20 mg total) by mouth daily., Disp: 90 tablet, Rfl: 3   lovastatin (MEVACOR) 40 MG tablet, Take 1 tablet (40 mg total) by mouth at bedtime., Disp: 90 tablet, Rfl: 3  methylphenidate (RITALIN) 5 MG tablet, TAKE 1 TABLET BY MOUTH DAILY AS NEEDED, Disp: 30 tablet, Rfl: 0   metoprolol tartrate (LOPRESSOR) 50 MG tablet, Take 1 tablet (50 mg total) by mouth 2 (two) times daily., Disp: 180 tablet, Rfl: 3   montelukast (SINGULAIR) 10 MG tablet, Take 1 tablet (10 mg total) by mouth at bedtime., Disp: 90 tablet, Rfl: 3   predniSONE (DELTASONE) 5 MG tablet, Take 1 tablet (5 mg total) by mouth daily with breakfast., Disp: 90 tablet, Rfl: 2   warfarin  (COUMADIN) 5 MG tablet, TAKE 1 TABLET ON SATURDAY, SUNDAY, TUESDAY, & THURSDAY. TAKE 1/2 TABLET ON MONDAY, WEDNESDAY, & FRIDAY, Disp: 70 tablet, Rfl: 0 No current facility-administered medications for this visit.  Facility-Administered Medications Ordered in Other Visits:    0.9 %  sodium chloride infusion, , Intravenous, Continuous, [name], MD, Last Rate: 10 mL/hr at 11/09/23 1039, New Bag at 11/09/23 1039   albuterol (PROVENTIL) (2.5 MG/3ML) 0.083% nebulizer solution 2.5 mg, 2.5 mg, Nebulization, Once, [name], MD   cetuximab (ERBITUX) chemo infusion 1,000 mg, 500 mg/m2 (Treatment Plan Recorded), Intravenous, Once, [name], MD   heparin lock flush 100 unit/mL, 500 Units, Intracatheter, Once PRN, [name], MD   sodium chloride flush (NS) 0.9 % injection 10 mL, 10 mL, Intracatheter, PRN, [name], MD   Allergies: Allergies  Allergen Reactions   Penicillins Hives and Rash    Has patient had a PCN reaction causing immediate rash, facial/tongue/throat swelling, SOB or lightheadedness with hypotension: Yes Has patient had a PCN reaction causing severe rash involving mucus membranes or skin necrosis: Yes Has patient had a PCN reaction that required hospitalization: No Has patient had a PCN reaction occurring within the last 10 years: No If all of the above answers are "NO", then may proceed with Cephalosporin use.    Cetuximab Cough    Cough and scratchy throat. Drug rechallenged and pt able to tolerated the rest of the infusion without any complications.    Hct [Hydrochlorothiazide] Other (See Comments)    hyper   Statins Other (See Comments)    Myalgia, tolerates low dosages of lovastatin     REVIEW OF SYSTEMS:   Review of Systems  Constitutional:  Negative for chills, fatigue and fever.  HENT:   Negative for lump/mass, mouth sores, nosebleeds, sore throat and trouble swallowing.        +sores on alae regions of  nose +soreness on ears  Eyes:  Negative for eye problems.       +blurry vision  Respiratory:  Negative for cough and shortness of breath.   Cardiovascular:  Negative for chest pain, leg swelling and palpitations.  Gastrointestinal:  Negative for abdominal pain, constipation, diarrhea, nausea and vomiting.  Genitourinary:  Negative for bladder incontinence, difficulty urinating, dysuria, frequency, hematuria and nocturia.   Musculoskeletal:  Negative for arthralgias, back pain, flank pain, myalgias and neck pain.       +pain in pelvic region  Skin:  Positive for itching (on head). Negative for rash.  Neurological:  Positive for dizziness. Negative for headaches and numbness.  Hematological:  Does not bruise/bleed easily.  Psychiatric/Behavioral:  Negative for depression, sleep disturbance and suicidal ideas. The patient is not nervous/anxious.   All other systems reviewed and are negative.    VITALS:   Weight 183 lb 4.8 oz (83.1 kg).  Wt Readings from Last 3 Encounters:  11/09/23 183 lb 4.8 oz (83.1 kg)  10/28/23 185 lb (83.9 kg)  10/25/23 185  lb (83.9 kg)    Body mass index is 29.14 kg/m.  Performance status (ECOG): 1 - Symptomatic but completely ambulatory  PHYSICAL EXAM:   Physical Exam Vitals and nursing note reviewed. Exam conducted with a chaperone present.  Constitutional:      Appearance: Normal appearance.  Cardiovascular:     Rate and Rhythm: Normal rate and regular rhythm.     Pulses: Normal pulses.     Heart sounds: Normal heart sounds.  Pulmonary:     Effort: Pulmonary effort is normal.     Breath sounds: Normal breath sounds.  Abdominal:     Palpations: Abdomen is soft. There is no hepatomegaly, splenomegaly or mass.     Tenderness: There is no abdominal tenderness.  Musculoskeletal:     Right lower leg: No edema.     Left lower leg: No edema.  Lymphadenopathy:     Cervical: No cervical adenopathy.     Right cervical: No superficial, deep or posterior  cervical adenopathy.    Left cervical: No superficial, deep or posterior cervical adenopathy.     Upper Body:     Right upper body: No supraclavicular or axillary adenopathy.     Left upper body: No supraclavicular or axillary adenopathy.  Neurological:     General: No focal deficit present.     Mental Status: He is alert and oriented to person, place, and time.  Psychiatric:        Mood and Affect: Mood normal.        Behavior: Behavior normal.     LABS:      Latest Ref Rng & Units 11/09/2023    8:43 AM 10/25/2023   10:06 AM 10/18/2023   11:48 AM  CBC  WBC 4.0 - 10.5 K/uL 17.1  15.4  17.2   Hemoglobin 13.0 - 17.0 g/dL 04.5  40.9  81.1   Hematocrit 39.0 - 52.0 % 41.2  40.3  40.1   Platelets 150 - 400 K/uL 169  192  207       Latest Ref Rng & Units 11/09/2023    8:39 AM 10/25/2023   10:06 AM 10/18/2023   11:48 AM  CMP  Glucose 70 - 99 mg/dL 914  782  956   BUN 8 - 23 mg/dL 20  24  22    Creatinine 0.61 - 1.24 mg/dL 2.13  0.86  5.78   Sodium 135 - 145 mmol/L 140  137  138   Potassium 3.5 - 5.1 mmol/L 3.2  3.4  3.8   Chloride 98 - 111 mmol/L 103  103  105   CO2 22 - 32 mmol/L 26  27  27    Calcium 8.9 - 10.3 mg/dL 9.0  9.0  8.8   Total Protein 6.5 - 8.1 g/dL   6.8   Total Bilirubin 0.0 - 1.2 mg/dL   0.5   Alkaline Phos 38 - 126 U/L   57   AST 15 - 41 U/L   17   ALT 0 - 44 U/L   19      Lab Results  Component Value Date   CEA1 5.4 (H) 06/03/2022   /  CEA  Date Value Ref Range Status  06/03/2022 5.4 (H) 0.0 - 4.7 ng/mL Final    Comment:    (NOTE)                             Nonsmokers          <  3.9                             Smokers             <5.6 Roche [employer] Electrochemiluminescence Immunoassay (ECLIA) Values obtained with different assay methods or kits cannot be used interchangeably.  Results cannot be interpreted as absolute evidence of the presence or absence of malignant disease. Performed At: [facility] [address] [place], [place]  [id number] Jolene Schimke MD WU:[id number]    Lab Results  Component Value Date   PSA1 11.3 (H) 02/16/2022   Lab Results  Component Value Date   CAN199 14 (H) 06/03/2022   No results found for: "CAN125"  No results found for: "TOTALPROTELP", "ALBUMINELP", "A1GS", "A2GS", "BETS", "BETA2SER", "GAMS", "MSPIKE", "SPEI" No results found for: "TIBC", "FERRITIN", "IRONPCTSAT" Lab Results  Component Value Date   LDH 164 06/03/2022     STUDIES:   CT CHEST ABDOMEN PELVIS W CONTRAST Result Date: 10/18/2023 CLINICAL DATA:  Right supraclavicular adenopathy multiple lung nodules. Head and neck metastatic disease evaluation. * Tracking Code: BO * EXAM: CT CHEST, ABDOMEN, AND PELVIS WITH CONTRAST TECHNIQUE: Multidetector CT imaging of the chest, abdomen and pelvis was performed following the standard protocol during bolus administration of intravenous contrast. RADIATION DOSE REDUCTION: This exam was performed according to the departmental dose-optimization program which includes automated exposure control, adjustment of the mA and/or kV according to patient size and/or use of iterative reconstruction technique. CONTRAST:  OMNIPAQUE IOHEXOL 300 MG/ML  SOLN COMPARISON:  07/16/2023. FINDINGS: CT CHEST FINDINGS Cardiovascular: Left IJ Port-A-Cath terminates in the SVC. Atherosclerotic calcification of the aorta and aortic valve. Enlarged pulmonic trunk and heart. No pericardial effusion. Mediastinum/Nodes: Thoracic inlet lymph nodes measure up to 1.5 cm on the right previously 1.2 cm. Posterior right supraclavicular lymph node measures 1.4 cm (3/1). Mediastinal lymph nodes have enlarged and appear somewhat necrotic, measuring up to 2.4 cm in the low right paratracheal station, previously 1.8 cm. Previously measured right hilar nodal mass is not well appreciated. No left axillary adenopathy. Right axillary lymph nodes have enlarged slightly, measuring up to 14 mm (3/26), previously 11 mm. Esophagus is  grossly unremarkable. Lungs/Pleura: Small area of irregular subpleural consolidation in the lateral right upper lobe (4/54), nonspecific and unchanged. Basilar predominant subpleural reticulation, ground-glass and mild bronchiolectasis. 4 mm subpleural anterior left upper lobe nodule (4/40), stable. No new pulmonary nodules. No pleural fluid. Airway is unremarkable. Musculoskeletal: Degenerative changes in the spine.  Osteopenia CT ABDOMEN PELVIS FINDINGS Hepatobiliary: Liver margin is irregular. Gallstones. No biliary ductal dilatation. Pancreas: Negative. Spleen: Negative. Adrenals/Urinary Tract: Adrenal glands are unremarkable. Low-attenuation lesions in the kidneys. No specific follow-up necessary. 1.4 cm hyperdense lesion in the lower pole right kidney measures 54 Hounsfield units on delayed imaging (1/26). Ureters are decompressed. Bladder is largely obscured by streak artifact bilateral hip arthroplasties. Stomach/Bowel: Moderate hiatal hernia. Duodenal diverticulum is incidentally noted. Stomach, small bowel and colon are otherwise unremarkable. Appendix is not readily identified. Vascular/Lymphatic: Atherosclerotic calcification of the aorta. No pathologically enlarged lymph nodes. Associated luminal narrowing of the superior mesenteric artery. Reproductive: Enlarged prostate 1. Enlarged prostate.  Is largely obscured by streak artifact from bilateral hip arthroplasties. Other: Small bilateral inguinal hernias contain fat. No free fluid. Mesenteries and peritoneum are unremarkable. Musculoskeletal: Bilateral hip arthroplasties. Degenerative changes in the spine. IMPRESSION: 1. Progressive low neck, mediastinal and right axillary adenopathy, compatible with metastatic disease progression. 2. Nonspecific area  of minimal subpleural consolidation in the lateral right upper lobe, unchanged. Recommend continued attention on follow-up. 3. Pulmonary fibrosis, possibly fibrotic nonspecific interstitial pneumonitis  or usual interstitial pneumonitis. 4. Hyperdense 1.4 cm lesion in the lower pole right kidney cannot be characterized as a simple cyst. This can be reassessed on routine follow-up imaging. If a more aggressive approach is desired, Further evaluation with pre and post contrast MRI should be considered. Pre and post contrast CT could alternatively be performed, but would likely be of decreased accuracy given lesion size. 5. Cirrhosis. 6. Moderate hiatal hernia. 7. Aortic atherosclerosis (ICD10-I70.0). Associated luminal narrowing of the superior mesenteric artery. 8. Enlarged pulmonic trunk, indicative of pulmonary arterial hypertension. Electronically Signed   By: Leanna Battles M.D.   On: 10/18/2023 13:36   CT SOFT TISSUE NECK W CONTRAST Result Date: 10/18/2023 CLINICAL DATA:  Head/neck cancer, monitor EXAM: CT NECK WITH CONTRAST TECHNIQUE: Multidetector CT imaging of the neck was performed using the standard protocol following the bolus administration of intravenous contrast. RADIATION DOSE REDUCTION: This exam was performed according to the departmental dose-optimization program which includes automated exposure control, adjustment of the mA and/or kV according to patient size and/or use of iterative reconstruction technique. CONTRAST:  OMNIPAQUE IOHEXOL 300 MG/ML  SOLN COMPARISON:  Neck CT 04/21/23 FINDINGS: Pharynx and larynx: Bilateral tonsils are normal in appearance. The epiglottis is normal in appearance. No evidence of retropharyngeal effusion. Salivary glands: No inflammation, mass, or stone. Thyroid: Normal. Lymph nodes: Compared to prior exam there is new right level 5 lymphadenopathy measuring up to 1.3 cm (series 8, image 49). There is also a new enlarged right level 4 lymph node measuring up to 1.5 cm (series 8, image 58). There is a left level 3 lymph node measuring up to 1.6 cm (series 8, image 44) and left level 4 lymph nodes measuring up to 1.0 cm (series 8, image 56). Vascular: Calcified  atherosclerotic plaque of the carotid bifurcations bilaterally. Limited intracranial: Negative. Visualized orbits: Bilateral lens replacement. Orbits are otherwise unremarkable. Mastoids and visualized paranasal sinuses: No ear or mastoid effusion. There is pansinus mucosal thickening with air-fluid levels in the right maxillary and bilateral sphenoid sinuses, which can be seen in the setting of acute sinusitis. Skeleton: Status post median sternotomy. No acute fracture. No focal bone lesion. Patient is edentulous. Unchanged lucent lesion in the C2 spinous process Upper chest: See separately dictated CT chest abdomen and pelvis for findings below the thoracic inlet, including characterization of mediastinal lymphadenopathy in a solid pulmonary nodule in the peripheral aspect of the right upper lobe measuring 1.3 cm. Left-sided chest port in place. Other: None. IMPRESSION: 1. New bilateral lower cervical lymphadenopathy, as above. Findings are concerning for metastatic disease. 2. Pansinus mucosal thickening with air-fluid levels in the right maxillary and bilateral sphenoid sinuses, which can be seen in the setting of acute sinusitis. 3. See separately dictated CT chest abdomen and pelvis for findings below the thoracic inlet. Electronically Signed   By: Lorenza Cambridge M.D.   On: 10/18/2023 13:34

## 2023-11-09 ENCOUNTER — Inpatient Hospital Stay: Payer: Medicare Other | Admitting: Hematology

## 2023-11-09 ENCOUNTER — Inpatient Hospital Stay: Payer: Medicare Other

## 2023-11-09 VITALS — Wt 183.3 lb

## 2023-11-09 VITALS — BP 132/76 | HR 74 | Temp 97.2°F | Resp 18

## 2023-11-09 DIAGNOSIS — C4442 Squamous cell carcinoma of skin of scalp and neck: Secondary | ICD-10-CM

## 2023-11-09 DIAGNOSIS — Z95828 Presence of other vascular implants and grafts: Secondary | ICD-10-CM | POA: Diagnosis not present

## 2023-11-09 DIAGNOSIS — R530 Neoplastic (malignant) related fatigue: Secondary | ICD-10-CM | POA: Diagnosis not present

## 2023-11-09 DIAGNOSIS — D72820 Lymphocytosis (symptomatic): Secondary | ICD-10-CM | POA: Diagnosis not present

## 2023-11-09 DIAGNOSIS — C778 Secondary and unspecified malignant neoplasm of lymph nodes of multiple regions: Secondary | ICD-10-CM | POA: Diagnosis not present

## 2023-11-09 DIAGNOSIS — Z79620 Long term (current) use of immunosuppressive biologic: Secondary | ICD-10-CM | POA: Diagnosis not present

## 2023-11-09 DIAGNOSIS — Z801 Family history of malignant neoplasm of trachea, bronchus and lung: Secondary | ICD-10-CM | POA: Diagnosis not present

## 2023-11-09 DIAGNOSIS — Z87891 Personal history of nicotine dependence: Secondary | ICD-10-CM | POA: Diagnosis not present

## 2023-11-09 DIAGNOSIS — C801 Malignant (primary) neoplasm, unspecified: Secondary | ICD-10-CM | POA: Diagnosis not present

## 2023-11-09 DIAGNOSIS — Z5111 Encounter for antineoplastic chemotherapy: Secondary | ICD-10-CM | POA: Diagnosis not present

## 2023-11-09 LAB — CBC WITH DIFFERENTIAL/PLATELET
Abs Immature Granulocytes: 0.1 10*3/uL — ABNORMAL HIGH (ref 0.00–0.07)
Basophils Absolute: 0.1 10*3/uL (ref 0.0–0.1)
Basophils Relative: 0 %
Eosinophils Absolute: 0.6 10*3/uL — ABNORMAL HIGH (ref 0.0–0.5)
Eosinophils Relative: 3 %
HCT: 41.2 % (ref 39.0–52.0)
Hemoglobin: 13.4 g/dL (ref 13.0–17.0)
Immature Granulocytes: 1 %
Lymphocytes Relative: 23 %
Lymphs Abs: 4 10*3/uL (ref 0.7–4.0)
MCH: 33.5 pg (ref 26.0–34.0)
MCHC: 32.5 g/dL (ref 30.0–36.0)
MCV: 103 fL — ABNORMAL HIGH (ref 80.0–100.0)
Monocytes Absolute: 0.9 10*3/uL (ref 0.1–1.0)
Monocytes Relative: 5 %
Neutro Abs: 11.5 10*3/uL — ABNORMAL HIGH (ref 1.7–7.7)
Neutrophils Relative %: 68 %
Platelets: 169 10*3/uL (ref 150–400)
RBC: 4 MIL/uL — ABNORMAL LOW (ref 4.22–5.81)
RDW: 13.3 % (ref 11.5–15.5)
WBC: 17.1 10*3/uL — ABNORMAL HIGH (ref 4.0–10.5)
nRBC: 0 % (ref 0.0–0.2)

## 2023-11-09 LAB — BASIC METABOLIC PANEL
Anion gap: 11 (ref 5–15)
BUN: 20 mg/dL (ref 8–23)
CO2: 26 mmol/L (ref 22–32)
Calcium: 9 mg/dL (ref 8.9–10.3)
Chloride: 103 mmol/L (ref 98–111)
Creatinine, Ser: 0.99 mg/dL (ref 0.61–1.24)
GFR, Estimated: >60 mL/min (ref >60)
Glucose, Bld: 145 mg/dL — ABNORMAL HIGH (ref 70–99)
Potassium: 3.2 mmol/L — ABNORMAL LOW (ref 3.5–5.1)
Sodium: 140 mmol/L (ref 135–145)

## 2023-11-09 LAB — MAGNESIUM: Magnesium: 1.8 mg/dL (ref 1.7–2.4)

## 2023-11-09 MED ORDER — CLINDAMYCIN PHOSPHATE 1 % EX GEL: Freq: Two times a day (BID) | CUTANEOUS | 3 refills | Status: DC

## 2023-11-09 MED ORDER — SODIUM CHLORIDE 0.9% FLUSH
10.0000 mL | Freq: Once | INTRAVENOUS | Status: AC
  Administered 2023-11-09: 10 mL via INTRAVENOUS

## 2023-11-09 MED ORDER — CETUXIMAB CHEMO IV INJECTION 200 MG/100ML
500.0000 mg/m2 | Freq: Once | INTRAVENOUS | Status: AC
  Administered 2023-11-09: 1000 mg via INTRAVENOUS
  Filled 2023-11-09: qty 500

## 2023-11-09 MED ORDER — SODIUM CHLORIDE 0.9% FLUSH
10.0000 mL | INTRAVENOUS | Status: DC | PRN
  Administered 2023-11-09: 10 mL

## 2023-11-09 MED ORDER — METHYLPREDNISOLONE SODIUM SUCC 125 MG IJ SOLR
125.0000 mg | Freq: Once | INTRAMUSCULAR | Status: AC
  Administered 2023-11-09: 125 mg via INTRAVENOUS
  Filled 2023-11-09: qty 2

## 2023-11-09 MED ORDER — DIPHENHYDRAMINE HCL 50 MG/ML IJ SOLN
50.0000 mg | Freq: Once | INTRAMUSCULAR | Status: AC
  Administered 2023-11-09: 50 mg via INTRAVENOUS
  Filled 2023-11-09: qty 1

## 2023-11-09 MED ORDER — ALBUTEROL SULFATE (2.5 MG/3ML) 0.083% IN NEBU
2.5000 mg | INHALATION_SOLUTION | Freq: Once | RESPIRATORY_TRACT | Status: AC
  Administered 2023-11-09: 2.5 mg via RESPIRATORY_TRACT

## 2023-11-09 MED ORDER — FAMOTIDINE IN NACL 20-0.9 MG/50ML-% IV SOLN
20.0000 mg | Freq: Once | INTRAVENOUS | Status: AC
  Administered 2023-11-09: 20 mg via INTRAVENOUS
  Filled 2023-11-09: qty 50

## 2023-11-09 MED ORDER — ALBUTEROL SULFATE (2.5 MG/3ML) 0.083% IN NEBU
2.5000 mg | INHALATION_SOLUTION | Freq: Once | RESPIRATORY_TRACT | Status: DC | PRN
Start: 2023-11-09 — End: 2023-11-09

## 2023-11-09 MED ORDER — HEPARIN SOD (PORK) LOCK FLUSH 100 UNIT/ML IV SOLN
500.0000 [IU] | Freq: Once | INTRAVENOUS | Status: AC | PRN
  Administered 2023-11-09: 500 [IU]

## 2023-11-09 MED ORDER — POTASSIUM CHLORIDE CRYS ER 20 MEQ PO TBCR
40.0000 meq | EXTENDED_RELEASE_TABLET | Freq: Once | ORAL | Status: AC
  Administered 2023-11-09: 40 meq via ORAL
  Filled 2023-11-09: qty 2

## 2023-11-09 MED ORDER — SODIUM CHLORIDE 0.9 % IV SOLN: INTRAVENOUS | Status: DC

## 2023-11-09 NOTE — Progress Notes (Signed)
Patient has been examined by Dr. Ellin Saba. Vital signs and labs have been reviewed by MD - ANC, Creatinine, LFTs, hemoglobin, and platelets are within treatment parameters per M.D. - pt may proceed with treatment.  Primary RN and pharmacy notified.

## 2023-11-09 NOTE — Patient Instructions (Signed)
[facility] at [facility] Discharge Instructions   You were seen and examined today by Dr. Ellin Saba.  He reviewed the results of your lab work which are normal/stable.   We will proceed with your treatment today.  Return as scheduled.    Thank you for choosing [facility] at [facility] to provide your oncology and hematology care.  To afford each patient quality time with our provider, please arrive at least 15 minutes before your scheduled appointment time.   If you have a lab appointment with the Cancer Center please come in thru the Main Entrance and check in at the main information desk.  You need to re-schedule your appointment should you arrive 10 or more minutes late.  We strive to give you quality time with our providers, and arriving late affects you and other patients whose appointments are after yours.  Also, if you no show three or more times for appointments you may be dismissed from the clinic at the providers discretion.     Again, thank you for choosing [facility].  Our hope is that these requests will decrease the amount of time that you wait before being seen by our physicians.       _____________________________________________________________  Should you have questions after your visit to [facility], please contact our office at [phone] and follow the prompts.  Our office hours are 8:00 a.m. and 4:30 p.m. Monday - Friday.  Please note that voicemails left after 4:00 p.m. may not be returned until the following business day.  We are closed weekends and major holidays.  You do have access to a nurse 24-7, just call the main number to the clinic [phone] and do not press any options, hold on the line and a nurse will answer the phone.    For prescription refill requests, have your pharmacy contact our office and allow 72 hours.    Due to Covid, you will need to wear a mask upon entering  the hospital. If you do not have a mask, a mask will be given to you at the Main Entrance upon arrival. For doctor visits, patients may have 1 support person age 67 or older with them. For treatment visits, patients can not have anyone with them due to social distancing guidelines and our immunocompromised population.

## 2023-11-09 NOTE — Progress Notes (Signed)
Erbitux given today per MD orders. Tolerated infusion without adverse affects. Vital signs stable. No complaints at this time. Discharged from clinic ambulatory in stable condition. Alert and oriented x 3. F/U with [facility] as scheduled.

## 2023-11-09 NOTE — Progress Notes (Signed)
Confirmed today's dose of Erbitux at 500 mg/m2  Will receive Albuterol nebulizer prior to Erbitux.  V.O. Dr Carilyn Goodpasture, PharmD

## 2023-11-09 NOTE — Progress Notes (Signed)
Patient tolerated treatment well with no complaints voiced.  Patient will wait the one hour post Erbitux wait time.

## 2023-11-09 NOTE — Progress Notes (Signed)
Patient presents today for Erbitux infusion. Patient is in satisfactory condition with no new complaints voiced.  Vital signs are stable.  Labs reviewed by Dr. Ellin Saba during the office visit and all labs are within treatment parameters. Albuterol nebulizer treatment will be given with pre-meds per Dr.K. Patient will also receive 40 mEq potassium chloride x 1 dose per Dr.K's standing orders. We will proceed with treatment per MD orders.

## 2023-11-09 NOTE — Patient Instructions (Signed)
[facility] - A DEPT OF [facility]  Discharge Instructions: Thank you for choosing [name] to provide your oncology and hematology care.  If you have a lab appointment with the [name] - please note that after April 8th, 2024, all labs will be drawn in the cancer [name].  You do not have to check in or register with the main entrance as you have in the past but will complete your check-in in the cancer [name].  Wear comfortable clothing and clothing appropriate for easy access to any Portacath or PICC line.   We strive to give you quality time with your provider. You may need to reschedule your appointment if you arrive late (15 or more minutes).  Arriving late affects you and other patients whose appointments are after yours.  Also, if you miss three or more appointments without notifying the office, you may be dismissed from the clinic at the provider's discretion.      For prescription refill requests, have your pharmacy contact our office and allow 72 hours for refills to be completed.    Today you received the following chemotherapy and/or immunotherapy agents Erbitux.  Cetuximab Injection What is this medication? CETUXIMAB (se TUX i mab) treats head and neck cancer. It may also be used to treat colorectal cancer. It works by blocking a protein that causes cancer cells to grow and multiply. This helps to slow or stop the spread of cancer cells. It is a monoclonal antibody. This medicine may be used for other purposes; ask your health care provider or pharmacist if you have questions. COMMON BRAND NAME(S): Erbitux What should I tell my care team before I take this medication? They need to know if you have any of these conditions: Heart disease History of tick bites Low levels of calcium, magnesium, or potassium in the blood Lung disease Red meat allergy An unusual or allergic reaction to cetuximab, other medications, foods, dyes,  or preservatives Pregnant or trying to get pregnant Breast-feeding How should I use this medication? This medication is infused into a vein. It is given by your care team in a hospital or clinic setting. Talk to your care team about the use of this medication in children. Special care may be needed. Overdosage: If you think you have taken too much of this medicine contact a poison control [name] or emergency room at once. NOTE: This medicine is only for you. Do not share this medicine with others. What if I miss a dose? Keep appointments for follow-up doses. It is important not to miss your dose. Call your care team if you are unable to keep an appointment. What may interact with this medication? Interactions are not expected. This list may not describe all possible interactions. Give your health care provider a list of all the medicines, herbs, non-prescription drugs, or dietary supplements you use. Also tell them if you smoke, drink alcohol, or use illegal drugs. Some items may interact with your medicine. What should I watch for while using this medication? Visit your care team for regular checks on your progress. This medication may make you feel generally unwell. This is not uncommon, as chemotherapy can affect healthy cells as well as cancer cells. Report any side effects. Continue your course of treatment even though you feel ill unless your care team tells you to stop. You may need blood work done while you are taking this medication. This medication can make you more sensitive  to the sun. Keep out of the sun while taking this medication and for 2 months after the last dose. If you cannot avoid being in the sun, wear protective clothing and sunscreen. Do not use sun lamps, tanning beds, or tanning booths. This medication can cause serious infusion reactions. To reduce the risk, your care team may give you other medications to take before receiving this one. Be sure to follow the directions of  your care team. This medication may cause serious skin reactions. They can happen weeks to months after starting the medication. Contact your care team right away if you notice fevers or flu-like symptoms with a rash. The rash may be red or purple and then turn into blisters or peeling of the skin. You may also notice a red rash with swelling of the face, lips, or lymph nodes in your neck or under your arms. Talk to your care team if you may be pregnant. Serious birth defects can occur if you take this medication during pregnancy and for 2 months after the last dose. You will need a negative pregnancy test before starting this medication. Contraception is recommended while taking this medication and for 2 months after the last dose. Your care team can help you find the option that works for you. Do not breastfeed while taking this medication and for 2 months after the last dose. This medication may cause infertility. Talk to your care team if you are concerned about your fertility. What side effects may I notice from receiving this medication? Side effects that you should report to your care team as soon as possible: Allergic reactions--skin rash, itching, hives, swelling of the face, lips, tongue, or throat Dry cough, shortness of breath or trouble breathing Heart attack--pain or tightness in the chest, shoulders, arms, or jaw, nausea, shortness of breath, cold or clammy skin, feeling faint or lightheaded Infusion reactions--chest pain, shortness of breath or trouble breathing, feeling faint or lightheaded Low calcium level--muscle pain or cramps, confusion, tingling, or numbness in the hands or feet Low magnesium level--muscle pain or cramps, unusual weakness or fatigue, fast or irregular heartbeat, tremors Low potassium level--muscle pain or cramps, unusual weakness or fatigue, fast or irregular heartbeat, constipation Redness, blistering, peeling, or loosening of the skin, including inside the  mouth Side effects that usually do not require medical attention (report to your care team if they continue or are bothersome): Diarrhea Headache Joint pain Nausea Unusual weakness or fatigue Weight loss This list may not describe all possible side effects. Call your doctor for medical advice about side effects. You may report side effects to FDA at 1-800-FDA-1088. Where should I keep my medication? This medication is given in a hospital or clinic. It will not be stored at home. NOTE: This sheet is a summary. It may not cover all possible information. If you have questions about this medicine, talk to your doctor, pharmacist, or health care provider.  2024 Elsevier/Gold Standard (2022-02-13 00:00:00)       To help prevent nausea and vomiting after your treatment, we encourage you to take your nausea medication as directed.  BELOW ARE SYMPTOMS THAT SHOULD BE REPORTED IMMEDIATELY: *FEVER GREATER THAN 100.4 F (38 C) OR HIGHER *CHILLS OR SWEATING *NAUSEA AND VOMITING THAT IS NOT CONTROLLED WITH YOUR NAUSEA MEDICATION *UNUSUAL SHORTNESS OF BREATH *UNUSUAL BRUISING OR BLEEDING *URINARY PROBLEMS (pain or burning when urinating, or frequent urination) *BOWEL PROBLEMS (unusual diarrhea, constipation, pain near the anus) TENDERNESS IN MOUTH AND THROAT WITH OR WITHOUT PRESENCE  OF ULCERS (sore throat, sores in mouth, or a toothache) UNUSUAL RASH, SWELLING OR PAIN  UNUSUAL VAGINAL DISCHARGE OR ITCHING   Items with * indicate a potential emergency and should be followed up as soon as possible or go to the Emergency Department if any problems should occur.  Please show the CHEMOTHERAPY ALERT CARD or IMMUNOTHERAPY ALERT CARD at check-in to the Emergency Department and triage nurse.  Should you have questions after your visit or need to cancel or reschedule your appointment, please contact [name] - A DEPT OF [facility] [phone]  and follow the prompts.   Office hours are 8:00 a.m. to 4:30 p.m. Monday - Friday. Please note that voicemails left after 4:00 p.m. may not be returned until the following business day.  We are closed weekends and major holidays. You have access to a nurse at all times for urgent questions. Please call the main number to the clinic [phone] and follow the prompts.  For any non-urgent questions, you may also contact your provider using MyChart. We now offer e-Visits for anyone 82 and older to request care online for non-urgent symptoms. For details visit [email or web address].   Also download the MyChart app! Go to the app store, search "MyChart", open the app, select [facility], and log in with your MyChart username and password.

## 2023-11-19 ENCOUNTER — Encounter: Payer: Self-pay | Admitting: Family Medicine

## 2023-11-19 ENCOUNTER — Ambulatory Visit (INDEPENDENT_AMBULATORY_CARE_PROVIDER_SITE_OTHER): Payer: Medicare Other | Admitting: Family Medicine

## 2023-11-19 VITALS — BP 135/89 | HR 73 | Ht 66.5 in | Wt 183.0 lb

## 2023-11-19 DIAGNOSIS — Z7901 Long term (current) use of anticoagulants: Secondary | ICD-10-CM | POA: Diagnosis not present

## 2023-11-19 DIAGNOSIS — I4819 Other persistent atrial fibrillation: Secondary | ICD-10-CM | POA: Diagnosis not present

## 2023-11-19 LAB — COAGUCHEK XS/INR WAIVED
INR: 1.5 — ABNORMAL HIGH (ref 0.9–1.1)
Prothrombin Time: 17.5 s

## 2023-11-19 NOTE — Progress Notes (Signed)
   BP 135/89   Pulse 73   Ht 5' 6.5 (1.689 m)   Wt 183 lb (83 kg)   SpO2 98%   BMI 29.09 kg/m    Subjective:   Patient ID: [name], male    DOB: 1932/03/07, 89 y.o.   MRN: [id number]  HPI: [name] is a 89 y.o. male presenting on 11/19/2023 for Medical Management of Chronic Issues, Atrial Fibrillation, and Rash (Face- believes from new chemo tx)   HPI Coumadin  recheck Target goal: 2.0-3.0 Reason on anticoagulation: Paroxysmal A-fib Patient denies any bruising or bleeding or chest pain or palpitations   Relevant past medical, surgical, family and social history reviewed and updated as indicated. Interim medical history since our last visit reviewed. Allergies and medications reviewed and updated.  Review of Systems  Constitutional:  Negative for chills and fever.  Eyes:  Negative for visual disturbance.  Respiratory:  Negative for shortness of breath and wheezing.   Cardiovascular:  Negative for chest pain and leg swelling.  Gastrointestinal:  Negative for blood in stool.  Genitourinary:  Negative for hematuria.  Musculoskeletal:  Negative for back pain and gait problem.  Skin:  Negative for rash.  All other systems reviewed and are negative.   Per HPI unless specifically indicated above       Objective:   BP 135/89   Pulse 73   Ht 5' 6.5 (1.689 m)   Wt 183 lb (83 kg)   SpO2 98%   BMI 29.09 kg/m   Wt Readings from Last 3 Encounters:  11/19/23 183 lb (83 kg)  11/09/23 183 lb 4.8 oz (83.1 kg)  10/28/23 185 lb (83.9 kg)    Physical Exam Vitals and nursing note reviewed.  Constitutional:      Appearance: Normal appearance.  Neurological:     Mental Status: He is alert.     Description    Take 2 today then increase dose to take 1/2 tablet every day except Wednesday, take a whole tablet Wednesday.   INR today is  1.5 (goal is 2-3)    Recheck in 3-4 weeks       Assessment & Plan:   Problem List Items Addressed This Visit        Cardiovascular and Mediastinum   Atrial fibrillation, persistent (HCC) - Primary   Relevant Orders   CoaguChek XS/INR Waived     Other   Long term current use of anticoagulant therapy     Follow up plan: Return if symptoms worsen or fail to improve, for 3 to 4-week INR recheck hypertension catheter blood work.  Counseling provided for all of the vaccine components Orders Placed This Encounter  Procedures   CoaguChek XS/INR Waived    Fonda Levins, MD [facility] 11/19/2023, 11:28 AM

## 2023-11-22 NOTE — Progress Notes (Signed)
[facility] [address] [place] [address]    Clinic Day:  11/23/2023  Referring physician: [name], Elige Radon, MD  Patient Care Team: [name], Elige Radon, MD as PCP - General (Family Medicine) [name], MD as PCP - Cardiology (Cardiology) Bjorn Pippin, MD as Attending Physician (Urology) [name], MD as Consulting Physician (Cardiology) [name], MD (Inactive) as Consulting Physician (Gastroenterology) [name], MD as Consulting Physician (Ophthalmology) [name], MD as Consulting Physician (Orthopedic Surgery) [name], [name], LCSW as [facility] Care Management (Licensed Clinical Social Worker) [name], MD as Medical Oncologist (Medical Oncology) [name], RN as Oncology Nurse Navigator (Medical Oncology)   ASSESSMENT & PLAN:   Assessment: 1.  Right supraclavicular lymphadenopathy and multiple lung nodules: - Patient noticed right neck mass since Christmas 2022.  10 to 15 pound weight loss since January 2023.  Denies any dysphagia or odynophagia. - CT neck (05/27/2022): Numerous lymph nodes in the right neck, right supraclavicular lymph node mass measures 5.7 x 3.7 cm.  18 mm posterior lymph node in the right lower neck.  Numerous lymph nodes in the right lower and posterior neck approximately 1 cm.  Many have internal necrosis consistent with metastatic disease.  20 mm lymph node just above the right clavicle.  Left level 2 node 12 mm. - CT CAP (05/28/2022): Several bilateral lung nodules, RUL nodule measuring 1.8 x 1.3 cm.  Small clusters of nodules in the medial left upper lobe.  Irregular nodular density along the left side of the mediastinum measuring 2.1 cm.  No metastatic disease in the abdomen or pelvis.  Liver is slightly nodular contour. - Lab work shows elevated white count since 2009, predominantly lymphocytosis. - Pathology (06/09/2022): Metastatic carcinoma positive for p40, p63  and CK5/6-patchy positivity with CK7.  Negative for TTF-1, Napsin A, PAX8, prostein, PSA, CK20 and CDX2.  - PET scan (06/11/2022): Bulky right supraclavicular nodal mass, smaller hypermetabolic right posterior triangle and jugular lymph nodes and single hypermetabolic left level 3 lymph node.  Bilateral hypermetabolic pulmonary nodules and metastatic pattern.  Minimal hypermetabolic mediastinal nodal metastasis.  Cluster of hypermetabolic right axillary lymph nodes.  No evidence of metastatic disease or primary lesion in the abdomen or pelvis.  No bone lesions. - MRI of the brain: Chronic small vessel ischemic changes with no evidence of metastatic disease. - NGS: T p53 pathogenic variant, MSI-stable, TMB-low, LOH-low, PD-L1 ([id number]): Negative, p16 and p18 negative.  HER2 by IHC 0. - PD-L1 22 C3: CPS score 25 - Cancer type ID: 96% probability squamous cell carcinoma, subtype head and neck/skin.  Cancer types ruled out with 95% confidence includes skin basal cell carcinoma. - Carboplatin, Taxol and pembrolizumab 07/23/2022 through 11/09/22 - Right axillary lymph node biopsy (08/04/2023): Metastatic moderate to poorly differentiated squamous cell carcinoma. - Cetuximab every 2 weeks started on 10/25/2023   2.  Social/family history: - He lives at home with his wife.  Independent of ADLs and IADLs.  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  May have had asbestos exposure 30 years ago.  Quit smoking cigarettes in 1965.  Smoked 1 pack/day for less than 12 years.  2 of his brothers had lung cancer and both were smokers.  3.  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    Plan: 1.  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 - He did not have any reactions with  cycle 2 of cetuximab. - Reviewed labs today: Normal creatinine and LFTs.  CBC grossly normal. - Proceed with cetuximab at 500 mg/m cycle 3 today with premedications.  RTC 2 weeks for follow-up.   2.  EGFR antibody induced rash: - Continue doxycycline twice daily.  He is also using clindamycin twice daily.  He still has some rash on the face and upper chest which is bothering him and itching. - Will send betamethasone dipropionate ointment twice daily alternating with clindamycin.   3.  Cancer-related fatigue: - Continue Ritalin 5 mg in the mornings as needed.  4.  Polyarthralgias: - Continue prednisone 5 mg daily which is helping with polyarthralgias.    No orders of the defined types were placed in this encounter.     [name],acting as a Neurosurgeon for [name], MD.,have documented all relevant documentation on the behalf of [name], MD,as directed by  [name], MD while in the presence of [name], MD.  I, [name] MD, have reviewed the above documentation for accuracy and completeness, and I agree with the above.     [name], MD   2/11/20253:21 PM  CHIEF COMPLAINT:   Diagnosis: metastatic squamous cell carcinoma    Cancer Staging  Squamous cell carcinoma of head and neck Staging form: Cervical Lymph Nodes and Unknown Primary Tumors of the Head and Neck, AJCC 8th Edition - Clinical stage from 07/16/2022: Stage IVC (cT0, cN3b, cM1) - Unsigned    Prior Therapy: Carboplatin, paclitaxel and pembrolizumab, 07/23/22 - 11/09/22   Current Therapy:  pembrolizumab    HISTORY OF PRESENT ILLNESS:   Oncology History  Squamous cell carcinoma of head and neck  07/16/2022 Initial Diagnosis   Squamous cell carcinoma of head and neck (HCC)   07/23/2022 - 09/27/2023 Chemotherapy   Patient is on Treatment Plan : Carboplatin  + Paclitaxel  + Pembrolizumab (200) D1 q21d     10/25/2023 -  Chemotherapy   Patient  is on Treatment Plan : HEAD/NECK Cetuximab q14d        INTERVAL HISTORY:   [name] is a 89 y.o. male presenting to clinic today for follow up of metastatic squamous cell carcinoma. He was last seen by me on 11/09/23.  Today, he states that he is doing well overall. His appetite level is at 100%. His energy level is at 25%. He is accompanied by his wife.   He reports an itching skin rash throughout the body, particularly on the face, upper chest, and upper back. He is treating rash with clindamycin gel and oral doxycycline. When outside he wears a hat, mask, and sunglasses to prevent worsening of rash.   He also notes his skin cracking on the fingers, worsened on the thumbs. He has been using non-alcoholic moisturizer for this.  He denies any reaction to last treatment on 11/09/23. He is taking Ritalin prn for fatigue. He is taking small dose prednisone and denies any arthralgias. He reports a normal appetite. He denies any nausea or flu-like symptoms.   PAST MEDICAL HISTORY:   Past Medical History: Past Medical History:  Diagnosis Date   Anemia    Atrial fibrillation (HCC)    Cataract    Cellulitis    In past   Complication of anesthesia    HARD TIME WAKING; CAUSES MY BP TO GO UP    Coronary artery disease  5 bypasses   DJD (degenerative joint disease)    Ejection fraction < 50%    Mildly reduced, 40% by echo   GERD (gastroesophageal reflux disease)    Hyperlipidemia    Hypertension    Persistent atrial fibrillation (HCC)    Prostate hypertrophy    on CT scan 09/2014   PUD (peptic ulcer disease)    RESOLVED    Skin cancer of eyelid    Resected    Surgical History: Past Surgical History:  Procedure Laterality Date   APPENDECTOMY     BYPASS GRAFT  2005   CATARACT EXTRACTION W/PHACO Left 07/22/2015   Procedure: CATARACT EXTRACTION PHACO AND INTRAOCULAR LENS PLACEMENT LEFT EYE CDE=8.14;  Surgeon: [name], MD;  Location: [facility];  Service: Ophthalmology;  Laterality:  Left;   CATARACT EXTRACTION W/PHACO Right 08/18/2019   Procedure: CATARACT EXTRACTION PHACO AND INTRAOCULAR LENS PLACEMENT (IOC);  Surgeon: Fabio Pierce, MD;  Location: [facility];  Service: Ophthalmology;  Laterality: Right;  CDE: 9.74   CORONARY ARTERY BYPASS GRAFT  4/05   LIMA to LAD, SVG to PDA, SVG to ramus intermediate   EYE SURGERY  07/2015   EYELID CARCINOMA EXCISION     Skin cancer resected   Heart bypass  2005   LYMPH NODE BIOPSY Right 06/09/2022   Procedure: LYMPH NODE BIOPSY; supraclavicular;  Surgeon: Lucretia Roers, MD;  Location: [facility];  Service: General;  Laterality: Right;   PORTACATH PLACEMENT Left 06/09/2022   Procedure: INSERTION PORT-A-CATH;  Surgeon: Lucretia Roers, MD;  Location: [facility];  Service: General;  Laterality: Left;   TOTAL HIP ARTHROPLASTY     Right   TOTAL HIP ARTHROPLASTY Left 05/04/2018   Procedure: LEFT TOTAL HIP ARTHROPLASTY;  Surgeon: [name], MD;  Location: [facility];  Service: Orthopedics;  Laterality: Left;   TOTAL KNEE ARTHROPLASTY     Right    Social History: Social History   Socioeconomic History   Marital status: Married    Spouse name: Talbert Forest   Number of children: 1   Years of education: 10   Highest education level: 10th grade  Occupational History   Occupation: retired  Tobacco Use   Smoking status: Former    Current packs/day: 0.00    Average packs/day: 2.0 packs/day for 5.0 years (10.0 ttl pk-yrs)    Types: Cigarettes    Start date: 12/23/1958    Quit date: 12/23/1963    Years since quitting: 59.9   Smokeless tobacco: Never  Vaping Use   Vaping status: Never Used  Substance and Sexual Activity   Alcohol use: No   Drug use: No   Sexual activity: Yes  Other Topics Concern   Not on file  Social History Narrative   Lives in a split level home.   Social Drivers of Corporate investment banker Strain: Low Risk  (10/25/2023)   Overall Financial Resource Strain (CARDIA)    Difficulty of Paying Living Expenses: Not  hard at all  Food Insecurity: No Food Insecurity (10/25/2023)   Hunger Vital Sign    Worried About Running Out of Food in the Last Year: Never true    Ran Out of Food in the Last Year: Never true  Transportation Needs: No Transportation Needs (10/25/2023)   PRAPARE - Administrator, Civil Service (Medical): No    Lack of Transportation (Non-Medical): No  Physical Activity: Inactive (10/25/2023)   Exercise Vital Sign    Days of Exercise per Week: 1 day  Minutes of Exercise per Session: 0 min  Stress: No Stress Concern Present (10/25/2023)   [employer] of Occupational Health - Occupational Stress Questionnaire    Feeling of Stress : Only a little  Social Connections: Moderately Isolated (10/25/2023)   Social Connection and Isolation Panel [NHANES]    Frequency of Communication with Friends and Family: More than three times a week    Frequency of Social Gatherings with Friends and Family: Twice a week    Attends Religious Services: Never    Database administrator or Organizations: No    Attends Banker Meetings: Never    Marital Status: Married  Catering manager Violence: Not At Risk (01/06/2023)   Humiliation, Afraid, Rape, and Kick questionnaire    Fear of Current or Ex-Partner: No    Emotionally Abused: No    Physically Abused: No    Sexually Abused: No    Family History: Family History  Problem Relation Age of Onset   Stroke Mother    Stroke Father    Hypertension Father    Early death Brother        78 months old   Cancer Brother        lung   Stroke Brother        heat    Current Medications:  Current Outpatient Medications:    acetaminophen (TYLENOL) 500 MG tablet, Take 1,000 mg by mouth every 6 (six) hours as needed for moderate pain., Disp: , Rfl:    betamethasone dipropionate 0.05 % cream, Apply topically 2 (two) times daily., Disp: 30 g, Rfl: 3   Cholecalciferol (VITAMIN D3) 5000 units CAPS, Take 5,000 Units by mouth once a week. ,  Disp: , Rfl:    clindamycin (CLINDAGEL) 1 % gel, Apply topically 2 (two) times daily., Disp: 30 g, Rfl: 3   diltiazem (CARDIZEM CD) 120 MG 24 hr capsule, TAKE ONE (1) CAPSULE EACH DAY, Disp: 90 capsule, Rfl: 3   doxycycline (VIBRA-TABS) 100 MG tablet, Take 1 tablet (100 mg total) by mouth 2 (two) times daily., Disp: 60 tablet, Rfl: 2   ezetimibe (ZETIA) 10 MG tablet, TAKE ONE (1) TABLET BY MOUTH EVERY DAY, Disp: 90 tablet, Rfl: 0   finasteride (PROSCAR) 5 MG tablet, TAKE ONE (1) TABLET EACH DAY, Disp: 90 tablet, Rfl: 3   fluticasone (FLONASE) 50 MCG/ACT nasal spray, Place 1 spray into both nostrils daily as needed for allergies or rhinitis., Disp: , Rfl:    furosemide (LASIX) 20 MG tablet, Take 1 tablet (20 mg total) by mouth daily., Disp: 90 tablet, Rfl: 3   lovastatin (MEVACOR) 40 MG tablet, Take 1 tablet (40 mg total) by mouth at bedtime., Disp: 90 tablet, Rfl: 3   methylphenidate (RITALIN) 5 MG tablet, TAKE 1 TABLET BY MOUTH DAILY AS NEEDED, Disp: 30 tablet, Rfl: 0   metoprolol tartrate (LOPRESSOR) 50 MG tablet, Take 1 tablet (50 mg total) by mouth 2 (two) times daily., Disp: 180 tablet, Rfl: 3   montelukast (SINGULAIR) 10 MG tablet, Take 1 tablet (10 mg total) by mouth at bedtime., Disp: 90 tablet, Rfl: 3   predniSONE (DELTASONE) 5 MG tablet, Take 1 tablet (5 mg total) by mouth daily with breakfast., Disp: 90 tablet, Rfl: 2   warfarin (COUMADIN) 5 MG tablet, TAKE 1 TABLET ON SATURDAY, SUNDAY, TUESDAY, & THURSDAY. TAKE 1/2 TABLET ON MONDAY, WEDNESDAY, & FRIDAY, Disp: 70 tablet, Rfl: 0 No current facility-administered medications for this visit.  Facility-Administered Medications Ordered in Other Visits:  0.9 %  sodium chloride infusion, , Intravenous, Continuous, [name], MD, Stopped at 11/23/23 1302   sodium chloride flush (NS) 0.9 % injection 10 mL, 10 mL, Intracatheter, PRN, [name], MD, 10 mL at 11/23/23 1259   Allergies: Allergies  Allergen Reactions    Penicillins Hives and Rash    Has patient had a PCN reaction causing immediate rash, facial/tongue/throat swelling, SOB or lightheadedness with hypotension: Yes Has patient had a PCN reaction causing severe rash involving mucus membranes or skin necrosis: Yes Has patient had a PCN reaction that required hospitalization: No Has patient had a PCN reaction occurring within the last 10 years: No If all of the above answers are "NO", then may proceed with Cephalosporin use.    Cetuximab Cough    Cough and scratchy throat. Drug rechallenged and pt able to tolerated the rest of the infusion without any complications.    Hct [Hydrochlorothiazide] Other (See Comments)    hyper   Statins Other (See Comments)    Myalgia, tolerates low dosages of lovastatin     REVIEW OF SYSTEMS:   Review of Systems  Constitutional:  Negative for chills, fatigue and fever.  HENT:   Negative for lump/mass, mouth sores, nosebleeds, sore throat and trouble swallowing.   Eyes:  Negative for eye problems.  Respiratory:  Positive for shortness of breath. Negative for cough.   Cardiovascular:  Negative for chest pain, leg swelling and palpitations.  Gastrointestinal:  Negative for abdominal pain, constipation, diarrhea, nausea and vomiting.  Genitourinary:  Positive for difficulty urinating. Negative for bladder incontinence, dysuria, frequency, hematuria and nocturia.   Musculoskeletal:  Negative for arthralgias, back pain, flank pain, myalgias and neck pain.  Skin:  Positive for itching and rash.  Neurological:  Positive for dizziness (occasional) and numbness (in fingers). Negative for headaches.  Hematological:  Does not bruise/bleed easily.  Psychiatric/Behavioral:  Negative for depression, sleep disturbance and suicidal ideas. The patient is not nervous/anxious.   All other systems reviewed and are negative.    VITALS:   There were no vitals taken for this visit.  Wt Readings from Last 3 Encounters:  11/23/23  186 lb (84.4 kg)  11/19/23 183 lb (83 kg)  11/09/23 183 lb 4.8 oz (83.1 kg)    There is no height or weight on file to calculate BMI.  Performance status (ECOG): 1 - Symptomatic but completely ambulatory  PHYSICAL EXAM:   Physical Exam Vitals and nursing note reviewed. Exam conducted with a chaperone present.  Constitutional:      Appearance: Normal appearance.  Cardiovascular:     Rate and Rhythm: Normal rate and regular rhythm.     Pulses: Normal pulses.     Heart sounds: Normal heart sounds.  Pulmonary:     Effort: Pulmonary effort is normal.     Breath sounds: Normal breath sounds.  Abdominal:     Palpations: Abdomen is soft. There is no hepatomegaly, splenomegaly or mass.     Tenderness: There is no abdominal tenderness.  Musculoskeletal:     Right lower leg: No edema.     Left lower leg: No edema.  Lymphadenopathy:     Cervical: No cervical adenopathy.     Right cervical: No superficial, deep or posterior cervical adenopathy.    Left cervical: No superficial, deep or posterior cervical adenopathy.     Upper Body:     Right upper body: No supraclavicular or axillary adenopathy.     Left upper body: No supraclavicular or axillary adenopathy.  Comments: +Bilateral lower neck lymph nodes decreased in size.  Neurological:     General: No focal deficit present.     Mental Status: He is alert and oriented to person, place, and time.  Psychiatric:        Mood and Affect: Mood normal.        Behavior: Behavior normal.     LABS:      Latest Ref Rng & Units 11/23/2023    8:28 AM 11/09/2023    8:43 AM 10/25/2023   10:06 AM  CBC  WBC 4.0 - 10.5 K/uL 17.3  17.1  15.4   Hemoglobin 13.0 - 17.0 g/dL 09.8  11.9  14.7   Hematocrit 39.0 - 52.0 % 39.1  41.2  40.3   Platelets 150 - 400 K/uL 193  169  192       Latest Ref Rng & Units 11/23/2023    8:27 AM 11/09/2023    8:39 AM 10/25/2023   10:06 AM  CMP  Glucose 70 - 99 mg/dL 829  562  130   BUN 8 - 23 mg/dL 21  20  24     Creatinine 0.61 - 1.24 mg/dL 8.65  7.84  6.96   Sodium 135 - 145 mmol/L 137  140  137   Potassium 3.5 - 5.1 mmol/L 3.3  3.2  3.4   Chloride 98 - 111 mmol/L 103  103  103   CO2 22 - 32 mmol/L 27  26  27    Calcium 8.9 - 10.3 mg/dL 8.7  9.0  9.0   Total Protein 6.5 - 8.1 g/dL 5.7     Total Bilirubin 0.0 - 1.2 mg/dL 0.6     Alkaline Phos 38 - 126 U/L 51     AST 15 - 41 U/L 21     ALT 0 - 44 U/L 23        Lab Results  Component Value Date   CEA1 5.4 (H) 06/03/2022   /  CEA  Date Value Ref Range Status  06/03/2022 5.4 (H) 0.0 - 4.7 ng/mL Final    Comment:    (NOTE)                             Nonsmokers          <3.9                             Smokers             <5.6 Roche [employer]ostics Electrochemiluminescence Immunoassay (ECLIA) Values obtained with different assay methods or kits cannot be used interchangeably.  Results cannot be interpreted as absolute evidence of the presence or absence of malignant disease. Performed At: [facility] [address] [place], [place] [id number] Jolene Schimke MD [id number]    Lab Results  Component Value Date   PSA1 11.3 (H) 02/16/2022   Lab Results  Component Value Date   CAN199 26 (H) 06/03/2022   No results found for: "CAN125"  No results found for: "TOTALPROTELP", "ALBUMINELP", "A1GS", "A2GS", "BETS", "BETA2SER", "GAMS", "MSPIKE", "SPEI" No results found for: "TIBC", "FERRITIN", "IRONPCTSAT" Lab Results  Component Value Date   LDH 164 06/03/2022     STUDIES:   No results found.

## 2023-11-23 ENCOUNTER — Inpatient Hospital Stay: Payer: Medicare Other

## 2023-11-23 ENCOUNTER — Inpatient Hospital Stay: Payer: Medicare Other | Attending: Hematology

## 2023-11-23 ENCOUNTER — Inpatient Hospital Stay: Payer: Medicare Other | Admitting: Hematology

## 2023-11-23 VITALS — BP 128/82 | HR 86 | Temp 97.8°F | Resp 18 | Wt 186.0 lb

## 2023-11-23 DIAGNOSIS — M2550 Pain in unspecified joint: Secondary | ICD-10-CM

## 2023-11-23 DIAGNOSIS — R918 Other nonspecific abnormal finding of lung field: Secondary | ICD-10-CM | POA: Insufficient documentation

## 2023-11-23 DIAGNOSIS — Z7952 Long term (current) use of systemic steroids: Secondary | ICD-10-CM | POA: Insufficient documentation

## 2023-11-23 DIAGNOSIS — C773 Secondary and unspecified malignant neoplasm of axilla and upper limb lymph nodes: Secondary | ICD-10-CM

## 2023-11-23 DIAGNOSIS — Z5111 Encounter for antineoplastic chemotherapy: Secondary | ICD-10-CM | POA: Insufficient documentation

## 2023-11-23 DIAGNOSIS — Z87891 Personal history of nicotine dependence: Secondary | ICD-10-CM | POA: Diagnosis not present

## 2023-11-23 DIAGNOSIS — C778 Secondary and unspecified malignant neoplasm of lymph nodes of multiple regions: Secondary | ICD-10-CM | POA: Insufficient documentation

## 2023-11-23 DIAGNOSIS — Z95828 Presence of other vascular implants and grafts: Secondary | ICD-10-CM

## 2023-11-23 DIAGNOSIS — R21 Rash and other nonspecific skin eruption: Secondary | ICD-10-CM | POA: Diagnosis not present

## 2023-11-23 DIAGNOSIS — R530 Neoplastic (malignant) related fatigue: Secondary | ICD-10-CM

## 2023-11-23 DIAGNOSIS — C4442 Squamous cell carcinoma of skin of scalp and neck: Secondary | ICD-10-CM

## 2023-11-23 DIAGNOSIS — Z801 Family history of malignant neoplasm of trachea, bronchus and lung: Secondary | ICD-10-CM | POA: Diagnosis not present

## 2023-11-23 DIAGNOSIS — C760 Malignant neoplasm of head, face and neck: Secondary | ICD-10-CM | POA: Insufficient documentation

## 2023-11-23 DIAGNOSIS — D72820 Lymphocytosis (symptomatic): Secondary | ICD-10-CM | POA: Insufficient documentation

## 2023-11-23 LAB — CBC WITH DIFFERENTIAL/PLATELET
Abs Immature Granulocytes: 0.14 10*3/uL — ABNORMAL HIGH (ref 0.00–0.07)
Basophils Absolute: 0.1 10*3/uL (ref 0.0–0.1)
Basophils Relative: 0 %
Eosinophils Absolute: 0.3 10*3/uL (ref 0.0–0.5)
Eosinophils Relative: 2 %
HCT: 39.1 % (ref 39.0–52.0)
Hemoglobin: 12.9 g/dL — ABNORMAL LOW (ref 13.0–17.0)
Immature Granulocytes: 1 %
Lymphocytes Relative: 23 %
Lymphs Abs: 3.9 10*3/uL (ref 0.7–4.0)
MCH: 34.1 pg — ABNORMAL HIGH (ref 26.0–34.0)
MCHC: 33 g/dL (ref 30.0–36.0)
MCV: 103.4 fL — ABNORMAL HIGH (ref 80.0–100.0)
Monocytes Absolute: 0.8 10*3/uL (ref 0.1–1.0)
Monocytes Relative: 4 %
Neutro Abs: 12.1 10*3/uL — ABNORMAL HIGH (ref 1.7–7.7)
Neutrophils Relative %: 70 %
Platelets: 193 10*3/uL (ref 150–400)
RBC: 3.78 MIL/uL — ABNORMAL LOW (ref 4.22–5.81)
RDW: 13.2 % (ref 11.5–15.5)
WBC: 17.3 10*3/uL — ABNORMAL HIGH (ref 4.0–10.5)
nRBC: 0 % (ref 0.0–0.2)

## 2023-11-23 LAB — COMPREHENSIVE METABOLIC PANEL
ALT: 23 U/L (ref 0–44)
AST: 21 U/L (ref 15–41)
Albumin: 3 g/dL — ABNORMAL LOW (ref 3.5–5.0)
Alkaline Phosphatase: 51 U/L (ref 38–126)
Anion gap: 7 (ref 5–15)
BUN: 21 mg/dL (ref 8–23)
CO2: 27 mmol/L (ref 22–32)
Calcium: 8.7 mg/dL — ABNORMAL LOW (ref 8.9–10.3)
Chloride: 103 mmol/L (ref 98–111)
Creatinine, Ser: 0.99 mg/dL (ref 0.61–1.24)
GFR, Estimated: >60 mL/min (ref >60)
Glucose, Bld: 137 mg/dL — ABNORMAL HIGH (ref 70–99)
Potassium: 3.3 mmol/L — ABNORMAL LOW (ref 3.5–5.1)
Sodium: 137 mmol/L (ref 135–145)
Total Bilirubin: 0.6 mg/dL (ref 0.0–1.2)
Total Protein: 5.7 g/dL — ABNORMAL LOW (ref 6.5–8.1)

## 2023-11-23 LAB — MAGNESIUM: Magnesium: 1.7 mg/dL (ref 1.7–2.4)

## 2023-11-23 MED ORDER — METHYLPREDNISOLONE SODIUM SUCC 125 MG IJ SOLR
125.0000 mg | Freq: Once | INTRAMUSCULAR | Status: AC
  Administered 2023-11-23: 125 mg via INTRAVENOUS
  Filled 2023-11-23: qty 2

## 2023-11-23 MED ORDER — CETUXIMAB CHEMO IV INJECTION 200 MG/100ML
500.0000 mg/m2 | Freq: Once | INTRAVENOUS | Status: AC
  Administered 2023-11-23: 1000 mg via INTRAVENOUS
  Filled 2023-11-23: qty 500

## 2023-11-23 MED ORDER — FAMOTIDINE IN NACL 20-0.9 MG/50ML-% IV SOLN
20.0000 mg | Freq: Once | INTRAVENOUS | Status: AC
  Administered 2023-11-23: 20 mg via INTRAVENOUS
  Filled 2023-11-23: qty 50

## 2023-11-23 MED ORDER — ALBUTEROL SULFATE (2.5 MG/3ML) 0.083% IN NEBU
2.5000 mg | INHALATION_SOLUTION | Freq: Once | RESPIRATORY_TRACT | Status: AC
  Administered 2023-11-23: 2.5 mg via RESPIRATORY_TRACT
  Filled 2023-11-23: qty 3

## 2023-11-23 MED ORDER — SODIUM CHLORIDE 0.9 % IV SOLN: INTRAVENOUS | Status: DC

## 2023-11-23 MED ORDER — SODIUM CHLORIDE 0.9% FLUSH
10.0000 mL | INTRAVENOUS | Status: DC | PRN
  Administered 2023-11-23: 10 mL

## 2023-11-23 MED ORDER — HEPARIN SOD (PORK) LOCK FLUSH 100 UNIT/ML IV SOLN
500.0000 [IU] | Freq: Once | INTRAVENOUS | Status: AC | PRN
  Administered 2023-11-23: 500 [IU]

## 2023-11-23 MED ORDER — BETAMETHASONE DIPROPIONATE 0.05 % EX CREA: TOPICAL_CREAM | Freq: Two times a day (BID) | CUTANEOUS | 3 refills | Status: DC

## 2023-11-23 MED ORDER — DIPHENHYDRAMINE HCL 50 MG/ML IJ SOLN
50.0000 mg | Freq: Once | INTRAMUSCULAR | Status: AC
  Administered 2023-11-23: 50 mg via INTRAVENOUS
  Filled 2023-11-23: qty 1

## 2023-11-23 MED ORDER — SODIUM CHLORIDE 0.9% FLUSH
10.0000 mL | Freq: Once | INTRAVENOUS | Status: AC
  Administered 2023-11-23: 10 mL via INTRAVENOUS

## 2023-11-23 NOTE — Progress Notes (Signed)

## 2023-11-23 NOTE — Patient Instructions (Signed)

## 2023-11-23 NOTE — Patient Instructions (Signed)
[facility] [place] - A DEPT OF [facility]  Discharge Instructions: Thank you for choosing [facility] to provide your oncology and hematology care.  If you have a lab appointment with the Cancer Center - please note that after April 8th, 2024, all labs will be drawn in the cancer center.  You do not have to check in or register with the main entrance as you have in the past but will complete your check-in in the cancer center.  Wear comfortable clothing and clothing appropriate for easy access to any Portacath or PICC line.   We strive to give you quality time with your provider. You may need to reschedule your appointment if you arrive late (15 or more minutes).  Arriving late affects you and other patients whose appointments are after yours.  Also, if you miss three or more appointments without notifying the office, you may be dismissed from the clinic at the provider's discretion.      For prescription refill requests, have your pharmacy contact our office and allow 72 hours for refills to be completed.    Today you received the following chemotherapy and/or immunotherapy agents erbitux      To help prevent nausea and vomiting after your treatment, we encourage you to take your nausea medication as directed.  BELOW ARE SYMPTOMS THAT SHOULD BE REPORTED IMMEDIATELY: *FEVER GREATER THAN 100.4 F (38 C) OR HIGHER *CHILLS OR SWEATING *NAUSEA AND VOMITING THAT IS NOT CONTROLLED WITH YOUR NAUSEA MEDICATION *UNUSUAL SHORTNESS OF BREATH *UNUSUAL BRUISING OR BLEEDING *URINARY PROBLEMS (pain or burning when urinating, or frequent urination) *BOWEL PROBLEMS (unusual diarrhea, constipation, pain near the anus) TENDERNESS IN MOUTH AND THROAT WITH OR WITHOUT PRESENCE OF ULCERS (sore throat, sores in mouth, or a toothache) UNUSUAL RASH, SWELLING OR PAIN  UNUSUAL VAGINAL DISCHARGE OR ITCHING   Items with * indicate a potential emergency and should be followed up  as soon as possible or go to the Emergency Department if any problems should occur.  Please show the CHEMOTHERAPY ALERT CARD or IMMUNOTHERAPY ALERT CARD at check-in to the Emergency Department and triage nurse.  Should you have questions after your visit or need to cancel or reschedule your appointment, please contact [facility] [place] - A DEPT OF [facility] [phone]  and follow the prompts.  Office hours are 8:00 a.m. to 4:30 p.m. Monday - Friday. Please note that voicemails left after 4:00 p.m. may not be returned until the following business day.  We are closed weekends and major holidays. You have access to a nurse at all times for urgent questions. Please call the main number to the clinic [phone] and follow the prompts.  For any non-urgent questions, you may also contact your provider using MyChart. We now offer e-Visits for anyone 7 and older to request care online for non-urgent symptoms. For details visit [email or web address].   Also download the MyChart app! Go to the app store, search "MyChart", open the app, select , and log in with your MyChart username and password.

## 2023-11-23 NOTE — Progress Notes (Signed)
Patient has been examined by Dr. Ellin Saba. Vital signs and labs have been reviewed by MD - ANC, Creatinine, LFTs, hemoglobin, and platelets are within treatment parameters per M.D. - pt may proceed with treatment.  Primary RN and pharmacy notified.

## 2023-12-06 NOTE — Progress Notes (Signed)
 [facility] [address] [place] [address]    Clinic Day:  12/07/2023  Referring physician: [name], Elige Radon, MD  Patient Care Team: [name], Elige Radon, MD as PCP - General (Family Medicine) [name], MD as PCP - Cardiology (Cardiology) Bjorn Pippin, MD as Attending Physician (Urology) [name], MD as Consulting Physician (Cardiology) [name], MD (Inactive) as Consulting Physician (Gastroenterology) [name], MD as Consulting Physician (Ophthalmology) [name], MD as Consulting Physician (Orthopedic Surgery) [name], [name], LCSW as [facility] Care Management (Licensed Clinical Social Worker) [name], MD as Medical Oncologist (Medical Oncology) [name], RN as Oncology Nurse Navigator (Medical Oncology)   ASSESSMENT & PLAN:   Assessment: 1.  Right supraclavicular lymphadenopathy and multiple lung nodules: - Patient noticed right neck mass since Christmas 2022.  10 to 15 pound weight loss since January 2023.  Denies any dysphagia or odynophagia. - CT neck (05/27/2022): Numerous lymph nodes in the right neck, right supraclavicular lymph node mass measures 5.7 x 3.7 cm.  18 mm posterior lymph node in the right lower neck.  Numerous lymph nodes in the right lower and posterior neck approximately 1 cm.  Many have internal necrosis consistent with metastatic disease.  20 mm lymph node just above the right clavicle.  Left level 2 node 12 mm. - CT CAP (05/28/2022): Several bilateral lung nodules, RUL nodule measuring 1.8 x 1.3 cm.  Small clusters of nodules in the medial left upper lobe.  Irregular nodular density along the left side of the mediastinum measuring 2.1 cm.  No metastatic disease in the abdomen or pelvis.  Liver is slightly nodular contour. - Lab work shows elevated white count since 2009, predominantly lymphocytosis. - Pathology (06/09/2022): Metastatic carcinoma positive for p40, p63  and CK5/6-patchy positivity with CK7.  Negative for TTF-1, Napsin A, PAX8, prostein, PSA, CK20 and CDX2.  - PET scan (06/11/2022): Bulky right supraclavicular nodal mass, smaller hypermetabolic right posterior triangle and jugular lymph nodes and single hypermetabolic left level 3 lymph node.  Bilateral hypermetabolic pulmonary nodules and metastatic pattern.  Minimal hypermetabolic mediastinal nodal metastasis.  Cluster of hypermetabolic right axillary lymph nodes.  No evidence of metastatic disease or primary lesion in the abdomen or pelvis.  No bone lesions. - MRI of the brain: Chronic small vessel ischemic changes with no evidence of metastatic disease. - NGS: T p53 pathogenic variant, MSI-stable, TMB-low, LOH-low, PD-L1 (XL244): Negative, p16 and p18 negative.  HER2 by IHC 0. - PD-L1 22 C3: CPS score 25 - Cancer type ID: 96% probability squamous cell carcinoma, subtype head and neck/skin.  Cancer types ruled out with 95% confidence includes skin basal cell carcinoma. - Carboplatin, Taxol and pembrolizumab 07/23/2022 through 11/09/22 - Right axillary lymph node biopsy (08/04/2023): Metastatic moderate to poorly differentiated squamous cell carcinoma. - Cetuximab every 2 weeks started on 10/25/2023   2.  Social/family history: - He lives at home with his wife.  Independent of ADLs and IADLs.  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  May have had asbestos exposure 30 years ago.  Quit smoking cigarettes in 1965.  Smoked 1 pack/day for less than 12 years.  2 of his brothers had lung cancer and both were smokers.  3.  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    Plan: 1.  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 - He completed cycle 3 of cetuximab on  11/23/2023. - Reviewed labs today: Normal LFTs and electrolytes.  CBC shows leukocytosis likely from steroid use.  Hemoglobin and platelets are normal. - Because of worsening rash, I will cut back on the dose of cetuximab to 400 mg/m for today's dose.  Reevaluate in 2 weeks.   2.  EGFR antibody induced rash: - Continue doxycycline twice daily.  Rash has gotten worse.  At last visit I have given betamethasone cream.  He reports that clindamycin worked better.  I will send in refill for clindamycin and a bigger jar. - Will dose reduce cetuximab.   3.  Cancer-related fatigue: - Continue Ritalin 5 mg in the mornings as needed.  4.  Polyarthralgias: - Continue prednisone 5 mg daily which is helping with polyarthralgias.    No orders of the defined types were placed in this encounter.    Alben Deeds Teague,acting as a Neurosurgeon for [name], MD.,have documented all relevant documentation on the behalf of [name], MD,as directed by  [name], MD while in the presence of [name], MD.  I, [name] MD, have reviewed the above documentation for accuracy and completeness, and I agree with the above.     [name], MD   2/25/202510:36 AM  CHIEF COMPLAINT:   Diagnosis: metastatic squamous cell carcinoma    Cancer Staging  Squamous cell carcinoma of head and neck Staging form: Cervical Lymph Nodes and Unknown Primary Tumors of the Head and Neck, AJCC 8th Edition - Clinical stage from 07/16/2022: Stage IVC (cT0, cN3b, cM1) - Unsigned    Prior Therapy: Carboplatin, paclitaxel and pembrolizumab, 07/23/22 - 11/09/22   Current Therapy:  pembrolizumab    HISTORY OF PRESENT ILLNESS:   Oncology History  Squamous cell carcinoma of head and neck  07/16/2022 Initial Diagnosis   Squamous cell carcinoma of head and neck (HCC)   07/23/2022 - 09/27/2023 Chemotherapy   Patient is on Treatment Plan : Carboplatin  + Paclitaxel  +  Pembrolizumab (200) D1 q21d     10/25/2023 -  Chemotherapy   Patient is on Treatment Plan : HEAD/NECK Cetuximab q14d        INTERVAL HISTORY:   Azaryah is a 89 y.o. male presenting to clinic today for follow up of metastatic squamous cell carcinoma. He was last seen by me on 11/23/23.  Today, he states that he is doing well overall. His appetite level is at 100%. His energy level is at 0%. He is accompanied by his wife.   Eugean reports a worsened itchy rash that has spread throughout his upper body on the head, face, upper chest, upper back, and buttocks. He would like a refill of clindamycin cream, as it helped prevent rash from worsening. Chuck is taking doxycyline as prescribed. He also notes cracked fingertips and heels that are painful. Odies has been moisturizing daily without relief. Pain in heels and fingertips improves temporarily after putting clindamycin cream on it. Mr. Lumadue notes mouth sores.   PAST MEDICAL HISTORY:   Past Medical History: Past Medical History:  Diagnosis Date   Anemia    Atrial fibrillation (HCC)    Cataract    Cellulitis    In past   Complication of anesthesia    HARD TIME WAKING; CAUSES MY BP TO GO UP    Coronary artery disease    5 bypasses   DJD (degenerative joint disease)  Ejection fraction < 50%    Mildly reduced, 40% by echo   GERD (gastroesophageal reflux disease)    Hyperlipidemia    Hypertension    Persistent atrial fibrillation (HCC)    Prostate hypertrophy    on CT scan 09/2014   PUD (peptic ulcer disease)    RESOLVED    Skin cancer of eyelid    Resected    Surgical History: Past Surgical History:  Procedure Laterality Date   APPENDECTOMY     BYPASS GRAFT  2005   CATARACT EXTRACTION W/PHACO Left 07/22/2015   Procedure: CATARACT EXTRACTION PHACO AND INTRAOCULAR LENS PLACEMENT LEFT EYE CDE=8.14;  Surgeon: [name], MD;  Location: [facility];  Service: Ophthalmology;  Laterality: Left;   CATARACT EXTRACTION W/PHACO Right  08/18/2019   Procedure: CATARACT EXTRACTION PHACO AND INTRAOCULAR LENS PLACEMENT (IOC);  Surgeon: Fabio Pierce, MD;  Location: [facility];  Service: Ophthalmology;  Laterality: Right;  CDE: 9.74   CORONARY ARTERY BYPASS GRAFT  4/05   LIMA to LAD, SVG to PDA, SVG to ramus intermediate   EYE SURGERY  07/2015   EYELID CARCINOMA EXCISION     Skin cancer resected   Heart bypass  2005   LYMPH NODE BIOPSY Right 06/09/2022   Procedure: LYMPH NODE BIOPSY; supraclavicular;  Surgeon: Lucretia Roers, MD;  Location: [facility];  Service: General;  Laterality: Right;   PORTACATH PLACEMENT Left 06/09/2022   Procedure: INSERTION PORT-A-CATH;  Surgeon: Lucretia Roers, MD;  Location: [facility];  Service: General;  Laterality: Left;   TOTAL HIP ARTHROPLASTY     Right   TOTAL HIP ARTHROPLASTY Left 05/04/2018   Procedure: LEFT TOTAL HIP ARTHROPLASTY;  Surgeon: [name], MD;  Location: [facility];  Service: Orthopedics;  Laterality: Left;   TOTAL KNEE ARTHROPLASTY     Right    Social History: Social History   Socioeconomic History   Marital status: Married    Spouse name: Talbert Forest   Number of children: 1   Years of education: 10   Highest education level: 10th grade  Occupational History   Occupation: retired  Tobacco Use   Smoking status: Former    Current packs/day: 0.00    Average packs/day: 2.0 packs/day for 5.0 years (10.0 ttl pk-yrs)    Types: Cigarettes    Start date: 12/23/1958    Quit date: 12/23/1963    Years since quitting: 59.9   Smokeless tobacco: Never  Vaping Use   Vaping status: Never Used  Substance and Sexual Activity   Alcohol use: No   Drug use: No   Sexual activity: Yes  Other Topics Concern   Not on file  Social History Narrative   Lives in a split level home.   Social Drivers of Corporate investment banker Strain: Low Risk  (10/25/2023)   Overall Financial Resource Strain (CARDIA)    Difficulty of Paying Living Expenses: Not hard at all  Food Insecurity: No Food  Insecurity (10/25/2023)   Hunger Vital Sign    Worried About Running Out of Food in the Last Year: Never true    Ran Out of Food in the Last Year: Never true  Transportation Needs: No Transportation Needs (10/25/2023)   PRAPARE - Administrator, Civil Service (Medical): No    Lack of Transportation (Non-Medical): No  Physical Activity: Inactive (10/25/2023)   Exercise Vital Sign    Days of Exercise per Week: 1 day    Minutes of Exercise per Session: 0 min  Stress:  No Stress Concern Present (10/25/2023)   [employer] of Occupational Health - Occupational Stress Questionnaire    Feeling of Stress : Only a little  Social Connections: Moderately Isolated (10/25/2023)   Social Connection and Isolation Panel [NHANES]    Frequency of Communication with Friends and Family: More than three times a week    Frequency of Social Gatherings with Friends and Family: Twice a week    Attends Religious Services: Never    Database administrator or Organizations: No    Attends Banker Meetings: Never    Marital Status: Married  Catering manager Violence: Not At Risk (01/06/2023)   Humiliation, Afraid, Rape, and Kick questionnaire    Fear of Current or Ex-Partner: No    Emotionally Abused: No    Physically Abused: No    Sexually Abused: No    Family History: Family History  Problem Relation Age of Onset   Stroke Mother    Stroke Father    Hypertension Father    Early death Brother        65 months old   Cancer Brother        lung   Stroke Brother        heat    Current Medications:  Current Outpatient Medications:    acetaminophen (TYLENOL) 500 MG tablet, Take 1,000 mg by mouth every 6 (six) hours as needed for moderate pain., Disp: , Rfl:    betamethasone dipropionate 0.05 % cream, Apply topically 2 (two) times daily., Disp: 30 g, Rfl: 3   Cholecalciferol (VITAMIN D3) 5000 units CAPS, Take 5,000 Units by mouth once a week. , Disp: , Rfl:    diltiazem (CARDIZEM  CD) 120 MG 24 hr capsule, TAKE ONE (1) CAPSULE EACH DAY, Disp: 90 capsule, Rfl: 3   doxycycline (VIBRA-TABS) 100 MG tablet, Take 1 tablet (100 mg total) by mouth 2 (two) times daily., Disp: 60 tablet, Rfl: 2   ezetimibe (ZETIA) 10 MG tablet, TAKE ONE (1) TABLET BY MOUTH EVERY DAY, Disp: 90 tablet, Rfl: 0   finasteride (PROSCAR) 5 MG tablet, TAKE ONE (1) TABLET EACH DAY, Disp: 90 tablet, Rfl: 3   fluticasone (FLONASE) 50 MCG/ACT nasal spray, Place 1 spray into both nostrils daily as needed for allergies or rhinitis., Disp: , Rfl:    furosemide (LASIX) 20 MG tablet, Take 1 tablet (20 mg total) by mouth daily., Disp: 90 tablet, Rfl: 3   lovastatin (MEVACOR) 40 MG tablet, Take 1 tablet (40 mg total) by mouth at bedtime., Disp: 90 tablet, Rfl: 3   methylphenidate (RITALIN) 5 MG tablet, TAKE 1 TABLET BY MOUTH DAILY AS NEEDED, Disp: 30 tablet, Rfl: 0   metoprolol tartrate (LOPRESSOR) 50 MG tablet, Take 1 tablet (50 mg total) by mouth 2 (two) times daily., Disp: 180 tablet, Rfl: 3   montelukast (SINGULAIR) 10 MG tablet, Take 1 tablet (10 mg total) by mouth at bedtime., Disp: 90 tablet, Rfl: 3   predniSONE (DELTASONE) 5 MG tablet, Take 1 tablet (5 mg total) by mouth daily with breakfast., Disp: 90 tablet, Rfl: 2   warfarin (COUMADIN) 5 MG tablet, TAKE 1 TABLET ON SATURDAY, SUNDAY, TUESDAY, & THURSDAY. TAKE 1/2 TABLET ON MONDAY, WEDNESDAY, & FRIDAY, Disp: 70 tablet, Rfl: 0   clindamycin (CLINDAGEL) 1 % gel, Apply topically 2 (two) times daily., Disp: 60 g, Rfl: 3   Allergies: Allergies  Allergen Reactions   Penicillins Hives and Rash    Has patient had a PCN reaction causing immediate rash,  facial/tongue/throat swelling, SOB or lightheadedness with hypotension: Yes Has patient had a PCN reaction causing severe rash involving mucus membranes or skin necrosis: Yes Has patient had a PCN reaction that required hospitalization: No Has patient had a PCN reaction occurring within the last 10 years: No If all of  the above answers are "NO", then may proceed with Cephalosporin use.    Cetuximab Cough    Cough and scratchy throat. Drug rechallenged and pt able to tolerated the rest of the infusion without any complications.    Hct [Hydrochlorothiazide] Other (See Comments)    hyper   Statins Other (See Comments)    Myalgia, tolerates low dosages of lovastatin     REVIEW OF SYSTEMS:   Review of Systems  Constitutional:  Negative for chills, fatigue and fever.  HENT:   Positive for mouth sores and trouble swallowing. Negative for lump/mass, nosebleeds and sore throat.   Eyes:  Negative for eye problems.  Respiratory:  Positive for cough and shortness of breath.   Cardiovascular:  Positive for chest pain (occasional). Negative for leg swelling and palpitations.  Gastrointestinal:  Negative for abdominal pain, constipation, diarrhea, nausea and vomiting.  Genitourinary:  Negative for bladder incontinence, difficulty urinating, dysuria, frequency, hematuria and nocturia.   Musculoskeletal:  Negative for arthralgias, back pain, flank pain, myalgias and neck pain.  Skin:  Positive for itching and rash.       +cracked heels and fingertips  Neurological:  Positive for dizziness and numbness (nd tingling in hands and feet). Negative for headaches.  Hematological:  Does not bruise/bleed easily.  Psychiatric/Behavioral:  Negative for depression, sleep disturbance and suicidal ideas. The patient is not nervous/anxious.   All other systems reviewed and are negative.    VITALS:   Blood pressure (!) 146/82, pulse 78, temperature (!) 96 F (35.6 C), temperature source Tympanic, resp. rate 18, weight 183 lb 6.8 oz (83.2 kg), SpO2 95%.  Wt Readings from Last 3 Encounters:  12/07/23 183 lb 6.8 oz (83.2 kg)  11/23/23 186 lb (84.4 kg)  11/19/23 183 lb (83 kg)    Body mass index is 29.16 kg/m.  Performance status (ECOG): 1 - Symptomatic but completely ambulatory  PHYSICAL EXAM:   Physical Exam Vitals and  nursing note reviewed. Exam conducted with a chaperone present.  Constitutional:      Appearance: Normal appearance.  Cardiovascular:     Rate and Rhythm: Normal rate and regular rhythm.     Pulses: Normal pulses.     Heart sounds: Normal heart sounds.  Pulmonary:     Effort: Pulmonary effort is normal.     Breath sounds: Normal breath sounds.  Abdominal:     Palpations: Abdomen is soft. There is no hepatomegaly, splenomegaly or mass.     Tenderness: There is no abdominal tenderness.  Musculoskeletal:     Right lower leg: No edema.     Left lower leg: No edema.  Lymphadenopathy:     Cervical: No cervical adenopathy.     Right cervical: No superficial, deep or posterior cervical adenopathy.    Left cervical: No superficial, deep or posterior cervical adenopathy.     Upper Body:     Right upper body: No supraclavicular or axillary adenopathy.     Left upper body: No supraclavicular or axillary adenopathy.  Skin:    Comments: +Maculopapular acneiform rash on upper chest  Neurological:     General: No focal deficit present.     Mental Status: He is alert and oriented  to person, place, and time.  Psychiatric:        Mood and Affect: Mood normal.        Behavior: Behavior normal.     LABS:      Latest Ref Rng & Units 12/07/2023    9:29 AM 11/23/2023    8:28 AM 11/09/2023    8:43 AM  CBC  WBC 4.0 - 10.5 K/uL 17.7  17.3  17.1   Hemoglobin 13.0 - 17.0 g/dL 88.4  16.6  06.3   Hematocrit 39.0 - 52.0 % 40.8  39.1  41.2   Platelets 150 - 400 K/uL 187  193  169       Latest Ref Rng & Units 12/07/2023    9:29 AM 11/23/2023    8:27 AM 11/09/2023    8:39 AM  CMP  Glucose 70 - 99 mg/dL 016  010  932   BUN 8 - 23 mg/dL 19  21  20    Creatinine 0.61 - 1.24 mg/dL 3.55  7.32  2.02   Sodium 135 - 145 mmol/L 138  137  140   Potassium 3.5 - 5.1 mmol/L 3.5  3.3  3.2   Chloride 98 - 111 mmol/L 104  103  103   CO2 22 - 32 mmol/L 27  27  26    Calcium 8.9 - 10.3 mg/dL 8.8  8.7  9.0   Total  Protein 6.5 - 8.1 g/dL 5.9  5.7    Total Bilirubin 0.0 - 1.2 mg/dL 0.7  0.6    Alkaline Phos 38 - 126 U/L 54  51    AST 15 - 41 U/L 28  21    ALT 0 - 44 U/L 29  23       Lab Results  Component Value Date   CEA1 5.4 (H) 06/03/2022   /  CEA  Date Value Ref Range Status  06/03/2022 5.4 (H) 0.0 - 4.7 ng/mL Final    Comment:    (NOTE)                             Nonsmokers          <3.9                             Smokers             <5.6 Roche [employer]ostics Electrochemiluminescence Immunoassay (ECLIA) Values obtained with different assay methods or kits cannot be used interchangeably.  Results cannot be interpreted as absolute evidence of the presence or absence of malignant disease. Performed At: [facility] [address] [place], [place] [id number] Jolene Schimke MD [id number]    Lab Results  Component Value Date   PSA1 11.3 (H) 02/16/2022   Lab Results  Component Value Date   CAN199 53 (H) 06/03/2022   No results found for: "CAN125"  No results found for: "TOTALPROTELP", "ALBUMINELP", "A1GS", "A2GS", "BETS", "BETA2SER", "GAMS", "MSPIKE", "SPEI" No results found for: "TIBC", "FERRITIN", "IRONPCTSAT" Lab Results  Component Value Date   LDH 164 06/03/2022     STUDIES:   No results found.

## 2023-12-07 ENCOUNTER — Inpatient Hospital Stay: Payer: Medicare Other | Admitting: Hematology

## 2023-12-07 ENCOUNTER — Inpatient Hospital Stay: Payer: Medicare Other

## 2023-12-07 VITALS — BP 131/87 | HR 81 | Temp 97.9°F | Resp 18

## 2023-12-07 VITALS — BP 146/82 | HR 78 | Temp 96.0°F | Resp 18 | Wt 183.4 lb

## 2023-12-07 DIAGNOSIS — R530 Neoplastic (malignant) related fatigue: Secondary | ICD-10-CM | POA: Diagnosis not present

## 2023-12-07 DIAGNOSIS — C4442 Squamous cell carcinoma of skin of scalp and neck: Secondary | ICD-10-CM

## 2023-12-07 DIAGNOSIS — Z95828 Presence of other vascular implants and grafts: Secondary | ICD-10-CM

## 2023-12-07 DIAGNOSIS — Z7952 Long term (current) use of systemic steroids: Secondary | ICD-10-CM | POA: Diagnosis not present

## 2023-12-07 DIAGNOSIS — Z87891 Personal history of nicotine dependence: Secondary | ICD-10-CM | POA: Diagnosis not present

## 2023-12-07 DIAGNOSIS — C778 Secondary and unspecified malignant neoplasm of lymph nodes of multiple regions: Secondary | ICD-10-CM | POA: Diagnosis not present

## 2023-12-07 DIAGNOSIS — R918 Other nonspecific abnormal finding of lung field: Secondary | ICD-10-CM | POA: Diagnosis not present

## 2023-12-07 DIAGNOSIS — D72820 Lymphocytosis (symptomatic): Secondary | ICD-10-CM | POA: Diagnosis not present

## 2023-12-07 DIAGNOSIS — C760 Malignant neoplasm of head, face and neck: Secondary | ICD-10-CM | POA: Diagnosis not present

## 2023-12-07 DIAGNOSIS — R21 Rash and other nonspecific skin eruption: Secondary | ICD-10-CM | POA: Diagnosis not present

## 2023-12-07 DIAGNOSIS — Z801 Family history of malignant neoplasm of trachea, bronchus and lung: Secondary | ICD-10-CM | POA: Diagnosis not present

## 2023-12-07 DIAGNOSIS — Z5111 Encounter for antineoplastic chemotherapy: Secondary | ICD-10-CM | POA: Diagnosis not present

## 2023-12-07 LAB — CBC WITH DIFFERENTIAL/PLATELET
Abs Immature Granulocytes: 0.12 10*3/uL — ABNORMAL HIGH (ref 0.00–0.07)
Basophils Absolute: 0.1 10*3/uL (ref 0.0–0.1)
Basophils Relative: 0 %
Eosinophils Absolute: 0.3 10*3/uL (ref 0.0–0.5)
Eosinophils Relative: 2 %
HCT: 40.8 % (ref 39.0–52.0)
Hemoglobin: 13.2 g/dL (ref 13.0–17.0)
Immature Granulocytes: 1 %
Lymphocytes Relative: 23 %
Lymphs Abs: 4.1 10*3/uL — ABNORMAL HIGH (ref 0.7–4.0)
MCH: 32.9 pg (ref 26.0–34.0)
MCHC: 32.4 g/dL (ref 30.0–36.0)
MCV: 101.7 fL — ABNORMAL HIGH (ref 80.0–100.0)
Monocytes Absolute: 0.9 10*3/uL (ref 0.1–1.0)
Monocytes Relative: 5 %
Neutro Abs: 12.2 10*3/uL — ABNORMAL HIGH (ref 1.7–7.7)
Neutrophils Relative %: 69 %
Platelets: 187 10*3/uL (ref 150–400)
RBC: 4.01 MIL/uL — ABNORMAL LOW (ref 4.22–5.81)
RDW: 13.4 % (ref 11.5–15.5)
WBC: 17.7 10*3/uL — ABNORMAL HIGH (ref 4.0–10.5)
nRBC: 0 % (ref 0.0–0.2)

## 2023-12-07 LAB — COMPREHENSIVE METABOLIC PANEL
ALT: 29 U/L (ref 0–44)
AST: 28 U/L (ref 15–41)
Albumin: 3.2 g/dL — ABNORMAL LOW (ref 3.5–5.0)
Alkaline Phosphatase: 54 U/L (ref 38–126)
Anion gap: 7 (ref 5–15)
BUN: 19 mg/dL (ref 8–23)
CO2: 27 mmol/L (ref 22–32)
Calcium: 8.8 mg/dL — ABNORMAL LOW (ref 8.9–10.3)
Chloride: 104 mmol/L (ref 98–111)
Creatinine, Ser: 0.97 mg/dL (ref 0.61–1.24)
GFR, Estimated: >60 mL/min (ref >60)
Glucose, Bld: 120 mg/dL — ABNORMAL HIGH (ref 70–99)
Potassium: 3.5 mmol/L (ref 3.5–5.1)
Sodium: 138 mmol/L (ref 135–145)
Total Bilirubin: 0.7 mg/dL (ref 0.0–1.2)
Total Protein: 5.9 g/dL — ABNORMAL LOW (ref 6.5–8.1)

## 2023-12-07 LAB — MAGNESIUM: Magnesium: 1.7 mg/dL (ref 1.7–2.4)

## 2023-12-07 MED ORDER — SODIUM CHLORIDE 0.9 % IV SOLN: INTRAVENOUS | Status: DC

## 2023-12-07 MED ORDER — DIPHENHYDRAMINE HCL 50 MG/ML IJ SOLN
50.0000 mg | Freq: Once | INTRAMUSCULAR | Status: AC
  Administered 2023-12-07: 50 mg via INTRAVENOUS
  Filled 2023-12-07: qty 1

## 2023-12-07 MED ORDER — CLINDAMYCIN PHOSPHATE 1 % EX GEL: Freq: Two times a day (BID) | CUTANEOUS | 3 refills | Status: DC

## 2023-12-07 MED ORDER — FAMOTIDINE IN NACL 20-0.9 MG/50ML-% IV SOLN
20.0000 mg | Freq: Once | INTRAVENOUS | Status: AC
  Administered 2023-12-07: 20 mg via INTRAVENOUS
  Filled 2023-12-07: qty 50

## 2023-12-07 MED ORDER — SODIUM CHLORIDE 0.9% FLUSH
10.0000 mL | INTRAVENOUS | Status: DC | PRN
  Administered 2023-12-07: 10 mL

## 2023-12-07 MED ORDER — HEPARIN SOD (PORK) LOCK FLUSH 100 UNIT/ML IV SOLN
500.0000 [IU] | Freq: Once | INTRAVENOUS | Status: AC | PRN
  Administered 2023-12-07: 500 [IU]

## 2023-12-07 MED ORDER — METHYLPREDNISOLONE SODIUM SUCC 125 MG IJ SOLR
125.0000 mg | Freq: Once | INTRAMUSCULAR | Status: AC
  Administered 2023-12-07: 125 mg via INTRAVENOUS
  Filled 2023-12-07: qty 2

## 2023-12-07 MED ORDER — CETUXIMAB CHEMO IV INJECTION 200 MG/100ML
400.0000 mg/m2 | Freq: Once | INTRAVENOUS | Status: AC
  Administered 2023-12-07: 800 mg via INTRAVENOUS
  Filled 2023-12-07: qty 400

## 2023-12-07 MED ORDER — SODIUM CHLORIDE 0.9% FLUSH
10.0000 mL | INTRAVENOUS | Status: DC | PRN
  Administered 2023-12-07: 10 mL via INTRAVENOUS

## 2023-12-07 NOTE — Progress Notes (Signed)
 Patient okay for treatment today per Dr. Ellin Saba with dose reduction.

## 2023-12-07 NOTE — Progress Notes (Signed)

## 2023-12-07 NOTE — Patient Instructions (Signed)

## 2023-12-07 NOTE — Progress Notes (Signed)
 Patient has been examined by Dr. Ellin Saba. Vital signs and labs have been reviewed by MD - ANC, Creatinine, LFTs, hemoglobin, and platelets are within treatment parameters per M.D. - pt may proceed with treatment. Erbitux dose reduced today to 400 mg/m2. Primary RN and pharmacy notified.

## 2023-12-07 NOTE — Patient Instructions (Signed)

## 2023-12-17 ENCOUNTER — Ambulatory Visit: Payer: Medicare Other | Admitting: Family Medicine

## 2023-12-17 ENCOUNTER — Encounter: Payer: Self-pay | Admitting: Family Medicine

## 2023-12-17 VITALS — BP 136/86 | HR 100 | Ht 66.5 in | Wt 183.0 lb

## 2023-12-17 DIAGNOSIS — I4819 Other persistent atrial fibrillation: Secondary | ICD-10-CM | POA: Diagnosis not present

## 2023-12-17 DIAGNOSIS — Z7901 Long term (current) use of anticoagulants: Secondary | ICD-10-CM

## 2023-12-17 LAB — COAGUCHEK XS/INR WAIVED
INR: 2.1 — ABNORMAL HIGH (ref 0.9–1.1)
Prothrombin Time: 25.8 s

## 2023-12-17 NOTE — Progress Notes (Signed)
 BP 136/86   Pulse 100   Ht 5' 6.5" (1.689 m)   Wt 183 lb (83 kg)   SpO2 98%   BMI 29.09 kg/m    Subjective:   Patient ID: [name], male    DOB: [birth year], 89 y.o.   MRN: [id number]  HPI: [name] is a 89 y.o. male presenting on 12/17/2023 for Medical Management of Chronic Issues and Atrial Fibrillation   HPI Coumadin recheck Target goal: 2.0-3.0 Reason on anticoagulation: A-fib Patient denies any bruising or bleeding or chest pain or palpitations   Relevant past medical, surgical, family and social history reviewed and updated as indicated. Interim medical history since our last visit reviewed. Allergies and medications reviewed and updated.  Review of Systems  Constitutional:  Negative for chills and fever.  Eyes:  Negative for discharge.  Respiratory:  Negative for shortness of breath and wheezing.   Cardiovascular:  Negative for chest pain and leg swelling.  Gastrointestinal:  Negative for blood in stool.  Genitourinary:  Negative for hematuria.  Musculoskeletal:  Negative for back pain and gait problem.  Skin:  Negative for rash.  All other systems reviewed and are negative.   Per HPI unless specifically indicated above   Allergies as of 12/17/2023       Reactions   Penicillins Hives, Rash   Has patient had a PCN reaction causing immediate rash, facial/tongue/throat swelling, SOB or lightheadedness with hypotension: Yes Has patient had a PCN reaction causing severe rash involving mucus membranes or skin necrosis: Yes Has patient had a PCN reaction that required hospitalization: No Has patient had a PCN reaction occurring within the last 10 years: No If all of the above answers are "NO", then may proceed with Cephalosporin use.   Cetuximab Cough   Cough and scratchy throat. Drug rechallenged and pt able to tolerated the rest of the infusion without any complications.    Hct [hydrochlorothiazide] Other (See Comments)   hyper   Statins Other (See  Comments)   Myalgia, tolerates low dosages of lovastatin         Medication List        Accurate as of December 17, 2023  1:47 PM. If you have any questions, ask your nurse or doctor.          acetaminophen 500 MG tablet Commonly known as: TYLENOL Take 1,000 mg by mouth every 6 (six) hours as needed for moderate pain.   betamethasone dipropionate 0.05 % cream Apply topically 2 (two) times daily.   clindamycin 1 % gel Commonly known as: Clindagel Apply topically 2 (two) times daily.   diltiazem 120 MG 24 hr capsule Commonly known as: CARDIZEM CD TAKE ONE (1) CAPSULE EACH DAY   doxycycline 100 MG tablet Commonly known as: VIBRA-TABS Take 1 tablet (100 mg total) by mouth 2 (two) times daily.   ezetimibe 10 MG tablet Commonly known as: ZETIA TAKE ONE (1) TABLET BY MOUTH EVERY DAY   finasteride 5 MG tablet Commonly known as: PROSCAR TAKE ONE (1) TABLET EACH DAY   fluticasone 50 MCG/ACT nasal spray Commonly known as: FLONASE Place 1 spray into both nostrils daily as needed for allergies or rhinitis.   furosemide 20 MG tablet Commonly known as: LASIX Take 1 tablet (20 mg total) by mouth daily.   lovastatin 40 MG tablet Commonly known as: MEVACOR Take 1 tablet (40 mg total) by mouth at bedtime.   methylphenidate 5 MG tablet Commonly known as: RITALIN TAKE 1 TABLET  BY MOUTH DAILY AS NEEDED   metoprolol tartrate 50 MG tablet Commonly known as: LOPRESSOR Take 1 tablet (50 mg total) by mouth 2 (two) times daily.   montelukast 10 MG tablet Commonly known as: SINGULAIR Take 1 tablet (10 mg total) by mouth at bedtime.   predniSONE 5 MG tablet Commonly known as: DELTASONE Take 1 tablet (5 mg total) by mouth daily with breakfast.   Vitamin D3 125 MCG (5000 UT) Caps Take 5,000 Units by mouth once a week.   warfarin 5 MG tablet Commonly known as: COUMADIN Take as directed by the anticoagulation clinic. If you are unsure how to take this medication, talk to your  nurse or doctor. Original instructions: TAKE 1 TABLET ON SATURDAY, SUNDAY, TUESDAY, & THURSDAY. TAKE 1/2 TABLET ON MONDAY, WEDNESDAY, & FRIDAY         Objective:   BP 136/86   Pulse 100   Ht 5' 6.5" (1.689 m)   Wt 183 lb (83 kg)   SpO2 98%   BMI 29.09 kg/m   Wt Readings from Last 3 Encounters:  12/17/23 183 lb (83 kg)  12/07/23 183 lb 6.8 oz (83.2 kg)  11/23/23 186 lb (84.4 kg)    Physical Exam Vitals and nursing note reviewed.  Constitutional:      General: He is not in acute distress.    Appearance: He is well-developed. He is not diaphoretic.  Eyes:     General: No scleral icterus.    Conjunctiva/sclera: Conjunctivae normal.  Neck:     Thyroid: No thyromegaly.  Cardiovascular:     Rate and Rhythm: Normal rate. Rhythm irregular.     Heart sounds: Normal heart sounds. No murmur heard. Pulmonary:     Effort: Pulmonary effort is normal. No respiratory distress.     Breath sounds: Normal breath sounds. No wheezing.  Musculoskeletal:     Cervical back: Neck supple.  Lymphadenopathy:     Cervical: No cervical adenopathy.  Skin:    General: Skin is warm and dry.     Findings: No rash.  Neurological:     Mental Status: He is alert and oriented to person, place, and time.     Coordination: Coordination normal.  Psychiatric:        Behavior: Behavior normal.       Assessment & Plan:   Problem List Items Addressed This Visit       Cardiovascular and Mediastinum   Atrial fibrillation, persistent (HCC) - Primary   Relevant Orders   CoaguChek XS/INR Waived     Other   Long term current use of anticoagulant therapy   Relevant Orders   CoaguChek XS/INR Waived    . Description    Continue dose to take 1/2 tablet every day except Wednesday, take a whole tablet Wednesday.   INR today is  2.1 (goal is 2-3)    Recheck in 4 to 6 weeks weeks      Follow up plan: Return if symptoms worsen or fail to improve, for 4 to 6-week INR recheck.  Counseling  provided for all of the vaccine components Orders Placed This Encounter  Procedures   CoaguChek XS/INR Waived    Arville Care, MD [facility] 12/17/2023, 1:47 PM

## 2023-12-21 ENCOUNTER — Inpatient Hospital Stay: Payer: Medicare Other

## 2023-12-21 ENCOUNTER — Inpatient Hospital Stay: Payer: Medicare Other | Attending: Hematology

## 2023-12-21 ENCOUNTER — Inpatient Hospital Stay: Payer: Medicare Other | Admitting: Hematology

## 2023-12-21 VITALS — BP 160/84 | HR 80 | Temp 97.6°F | Resp 18

## 2023-12-21 DIAGNOSIS — R21 Rash and other nonspecific skin eruption: Secondary | ICD-10-CM | POA: Diagnosis not present

## 2023-12-21 DIAGNOSIS — E876 Hypokalemia: Secondary | ICD-10-CM

## 2023-12-21 DIAGNOSIS — D72820 Lymphocytosis (symptomatic): Secondary | ICD-10-CM | POA: Insufficient documentation

## 2023-12-21 DIAGNOSIS — Z5111 Encounter for antineoplastic chemotherapy: Secondary | ICD-10-CM | POA: Insufficient documentation

## 2023-12-21 DIAGNOSIS — R918 Other nonspecific abnormal finding of lung field: Secondary | ICD-10-CM | POA: Diagnosis not present

## 2023-12-21 DIAGNOSIS — C4442 Squamous cell carcinoma of skin of scalp and neck: Secondary | ICD-10-CM

## 2023-12-21 DIAGNOSIS — Z801 Family history of malignant neoplasm of trachea, bronchus and lung: Secondary | ICD-10-CM | POA: Insufficient documentation

## 2023-12-21 DIAGNOSIS — C4492 Squamous cell carcinoma of skin, unspecified: Secondary | ICD-10-CM

## 2023-12-21 DIAGNOSIS — C760 Malignant neoplasm of head, face and neck: Secondary | ICD-10-CM | POA: Diagnosis not present

## 2023-12-21 DIAGNOSIS — R530 Neoplastic (malignant) related fatigue: Secondary | ICD-10-CM | POA: Insufficient documentation

## 2023-12-21 DIAGNOSIS — Z87891 Personal history of nicotine dependence: Secondary | ICD-10-CM | POA: Insufficient documentation

## 2023-12-21 DIAGNOSIS — C7989 Secondary malignant neoplasm of other specified sites: Secondary | ICD-10-CM

## 2023-12-21 DIAGNOSIS — C778 Secondary and unspecified malignant neoplasm of lymph nodes of multiple regions: Secondary | ICD-10-CM | POA: Diagnosis not present

## 2023-12-21 DIAGNOSIS — Z95828 Presence of other vascular implants and grafts: Secondary | ICD-10-CM

## 2023-12-21 LAB — COMPREHENSIVE METABOLIC PANEL
ALT: 23 U/L (ref 0–44)
AST: 19 U/L (ref 15–41)
Albumin: 3.1 g/dL — ABNORMAL LOW (ref 3.5–5.0)
Alkaline Phosphatase: 60 U/L (ref 38–126)
Anion gap: 10 (ref 5–15)
BUN: 18 mg/dL (ref 8–23)
CO2: 27 mmol/L (ref 22–32)
Calcium: 8.9 mg/dL (ref 8.9–10.3)
Chloride: 106 mmol/L (ref 98–111)
Creatinine, Ser: 1.01 mg/dL (ref 0.61–1.24)
GFR, Estimated: >60 mL/min (ref >60)
Glucose, Bld: 111 mg/dL — ABNORMAL HIGH (ref 70–99)
Potassium: 3.4 mmol/L — ABNORMAL LOW (ref 3.5–5.1)
Sodium: 143 mmol/L (ref 135–145)
Total Bilirubin: 0.8 mg/dL (ref 0.0–1.2)
Total Protein: 5.8 g/dL — ABNORMAL LOW (ref 6.5–8.1)

## 2023-12-21 LAB — CBC WITH DIFFERENTIAL/PLATELET
Abs Immature Granulocytes: 0.17 10*3/uL — ABNORMAL HIGH (ref 0.00–0.07)
Basophils Absolute: 0.1 10*3/uL (ref 0.0–0.1)
Basophils Relative: 0 %
Eosinophils Absolute: 0.2 10*3/uL (ref 0.0–0.5)
Eosinophils Relative: 1 %
HCT: 40.6 % (ref 39.0–52.0)
Hemoglobin: 13.3 g/dL (ref 13.0–17.0)
Immature Granulocytes: 1 %
Lymphocytes Relative: 24 %
Lymphs Abs: 4.4 10*3/uL — ABNORMAL HIGH (ref 0.7–4.0)
MCH: 33.8 pg (ref 26.0–34.0)
MCHC: 32.8 g/dL (ref 30.0–36.0)
MCV: 103.3 fL — ABNORMAL HIGH (ref 80.0–100.0)
Monocytes Absolute: 1 10*3/uL (ref 0.1–1.0)
Monocytes Relative: 5 %
Neutro Abs: 12.3 10*3/uL — ABNORMAL HIGH (ref 1.7–7.7)
Neutrophils Relative %: 69 %
Platelets: 201 10*3/uL (ref 150–400)
RBC: 3.93 MIL/uL — ABNORMAL LOW (ref 4.22–5.81)
RDW: 13.5 % (ref 11.5–15.5)
WBC: 18 10*3/uL — ABNORMAL HIGH (ref 4.0–10.5)
nRBC: 0 % (ref 0.0–0.2)

## 2023-12-21 LAB — MAGNESIUM: Magnesium: 1.6 mg/dL — ABNORMAL LOW (ref 1.7–2.4)

## 2023-12-21 MED ORDER — MAGNESIUM SULFATE 2 GM/50ML IV SOLN
2.0000 g | Freq: Once | INTRAVENOUS | Status: AC
  Administered 2023-12-21: 2 g via INTRAVENOUS
  Filled 2023-12-21: qty 50

## 2023-12-21 MED ORDER — SODIUM CHLORIDE 0.9% FLUSH
10.0000 mL | INTRAVENOUS | Status: DC | PRN
  Administered 2023-12-21: 10 mL via INTRAVENOUS

## 2023-12-21 MED ORDER — SODIUM CHLORIDE 0.9% FLUSH
10.0000 mL | INTRAVENOUS | Status: DC | PRN
  Administered 2023-12-21: 10 mL

## 2023-12-21 MED ORDER — METHYLPREDNISOLONE SODIUM SUCC 125 MG IJ SOLR
125.0000 mg | Freq: Once | INTRAMUSCULAR | Status: AC
  Administered 2023-12-21: 125 mg via INTRAVENOUS
  Filled 2023-12-21: qty 2

## 2023-12-21 MED ORDER — POTASSIUM CHLORIDE CRYS ER 20 MEQ PO TBCR
40.0000 meq | EXTENDED_RELEASE_TABLET | Freq: Once | ORAL | Status: AC
  Administered 2023-12-21: 40 meq via ORAL
  Filled 2023-12-21: qty 2

## 2023-12-21 MED ORDER — CETUXIMAB CHEMO IV INJECTION 200 MG/100ML
400.0000 mg/m2 | Freq: Once | INTRAVENOUS | Status: AC
  Administered 2023-12-21: 800 mg via INTRAVENOUS
  Filled 2023-12-21: qty 400

## 2023-12-21 MED ORDER — DIPHENHYDRAMINE HCL 50 MG/ML IJ SOLN
50.0000 mg | Freq: Once | INTRAMUSCULAR | Status: AC
  Administered 2023-12-21: 50 mg via INTRAVENOUS
  Filled 2023-12-21: qty 1

## 2023-12-21 MED ORDER — FAMOTIDINE IN NACL 20-0.9 MG/50ML-% IV SOLN
20.0000 mg | Freq: Once | INTRAVENOUS | Status: AC
  Administered 2023-12-21: 20 mg via INTRAVENOUS
  Filled 2023-12-21: qty 50

## 2023-12-21 MED ORDER — SODIUM CHLORIDE 0.9 % IV SOLN: INTRAVENOUS | Status: DC

## 2023-12-21 MED ORDER — HEPARIN SOD (PORK) LOCK FLUSH 100 UNIT/ML IV SOLN
500.0000 [IU] | Freq: Once | INTRAVENOUS | Status: AC | PRN
  Administered 2023-12-21: 500 [IU]

## 2023-12-21 NOTE — Progress Notes (Signed)
 Patient presents today for chemotherapy infusion. Patient is in satisfactory condition with no new complaints voiced.  Vital signs are stable.  Labs reviewed by Dr. Ellin Saba during the office visit and all labs are within treatment parameters.  Potassium today is 3.4.  We will give Klor Con 40 mEq PO x one dose today per standing orders by Dr. Ellin Saba.  Magnesium today is 1.6.  We will give magnesium sulfate 2 grams IV x one dose today per standing orders by Dr. Ellin Saba.  We will proceed with treatment per MD orders.   Patient tolerated treatment well with no complaints voiced.  Patient left ambulatory with wife in stable condition.  Vital signs stable at discharge.  Follow up as scheduled.

## 2023-12-21 NOTE — Progress Notes (Signed)
 Patient has been examined by Dr. Ellin Saba. Vital signs and labs have been reviewed by MD - ANC, Creatinine, LFTs, hemoglobin, and platelets are within treatment parameters per M.D. - pt may proceed with treatment.  Primary RN and pharmacy notified.

## 2023-12-21 NOTE — Progress Notes (Signed)
 Patients port flushed without difficulty.  Good blood return noted with no bruising or swelling noted at site.  Patient remains accessed for treatment.

## 2023-12-21 NOTE — Progress Notes (Signed)
 [facility] [address] [place] [address]    Clinic Day:  12/21/2023  Referring physician: [name], Elige Radon, MD  Patient Care Team: [name], Elige Radon, MD as PCP - General (Family Medicine) [name], MD as PCP - Cardiology (Cardiology) Bjorn Pippin, MD as Attending Physician (Urology) [name], MD as Consulting Physician (Cardiology) [name], MD (Inactive) as Consulting Physician (Gastroenterology) [name], MD as Consulting Physician (Ophthalmology) [name], MD as Consulting Physician (Orthopedic Surgery) [name], [name], LCSW as [facility] Care Management (Licensed Clinical Social Worker) [name], MD as Medical Oncologist (Medical Oncology) [name], RN as Oncology Nurse Navigator (Medical Oncology)   ASSESSMENT & PLAN:   Assessment: 1.  Right supraclavicular lymphadenopathy and multiple lung nodules: - Patient noticed right neck mass since Christmas 2022.  10 to 15 pound weight loss since January 2023.  Denies any dysphagia or odynophagia. - CT neck (05/27/2022): Numerous lymph nodes in the right neck, right supraclavicular lymph node mass measures 5.7 x 3.7 cm.  18 mm posterior lymph node in the right lower neck.  Numerous lymph nodes in the right lower and posterior neck approximately 1 cm.  Many have internal necrosis consistent with metastatic disease.  20 mm lymph node just above the right clavicle.  Left level 2 node 12 mm. - CT CAP (05/28/2022): Several bilateral lung nodules, RUL nodule measuring 1.8 x 1.3 cm.  Small clusters of nodules in the medial left upper lobe.  Irregular nodular density along the left side of the mediastinum measuring 2.1 cm.  No metastatic disease in the abdomen or pelvis.  Liver is slightly nodular contour. - Lab work shows elevated white count since 2009, predominantly lymphocytosis. - Pathology (06/09/2022): Metastatic carcinoma positive for p40, p63  and CK5/6-patchy positivity with CK7.  Negative for TTF-1, Napsin A, PAX8, prostein, PSA, CK20 and CDX2.  - PET scan (06/11/2022): Bulky right supraclavicular nodal mass, smaller hypermetabolic right posterior triangle and jugular lymph nodes and single hypermetabolic left level 3 lymph node.  Bilateral hypermetabolic pulmonary nodules and metastatic pattern.  Minimal hypermetabolic mediastinal nodal metastasis.  Cluster of hypermetabolic right axillary lymph nodes.  No evidence of metastatic disease or primary lesion in the abdomen or pelvis.  No bone lesions. - MRI of the brain: Chronic small vessel ischemic changes with no evidence of metastatic disease. - NGS: T p53 pathogenic variant, MSI-stable, TMB-low, LOH-low, PD-L1 (ZD638): Negative, p16 and p18 negative.  HER2 by IHC 0. - PD-L1 22 C3: CPS score 25 - Cancer type ID: 96% probability squamous cell carcinoma, subtype head and neck/skin.  Cancer types ruled out with 95% confidence includes skin basal cell carcinoma. - Carboplatin, Taxol and pembrolizumab 07/23/2022 through 11/09/22 - Right axillary lymph node biopsy (08/04/2023): Metastatic moderate to poorly differentiated squamous cell carcinoma. - Cetuximab every 2 weeks started on 10/25/2023   2.  Social/family history: - He lives at home with his wife.  Independent of ADLs and IADLs.  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  May have had asbestos exposure 30 years ago.  Quit smoking cigarettes in 1965.  Smoked 1 pack/day for less than 12 years.  2 of his brothers had lung cancer and both were smokers.  3.  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    Plan: 1.  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 - He was started on cetuximab in January.   He is tolerating it reasonably well. - At last visit, I dose reduced cetuximab 400 mg/m every 2 weeks due to worsening rash.  He did not experience any significant changes in the rash. - Reviewed labs today: Normal LFTs.  Electrolytes show mild hypokalemia and hypomagnesemia. - Apart from the rash, he also has cracks on the fingers and feet which is stable and gets better with moisturizing lotion. - He had a mechanical fall (cream which he applied on his feet made him slip on the floor) at home but likely did not sustain any major injuries. - Right axillary and right supraclavicular adenopathy present on physical exam. - He may proceed with treatment today with cetuximab 400 mg/m.  RTC 2 weeks for follow-up.  Will repeat CT neck and chest with contrast prior to next visit.   2.  EGFR antibody induced rash: - Continue doxycycline twice daily.  Continue clindamycin twice daily.  He reports the rash is stable.   3.  Cancer-related fatigue: - Continue Ritalin 5 mg in the mornings as needed.  4.  Polyarthralgias: - Continue prednisone 5 mg daily which is helping with polyarthralgias.    Orders Placed This Encounter  Procedures   CT CHEST W CONTRAST    Standing Status:   Future    Expected Date:   01/04/2024    Expiration Date:   12/20/2024    If indicated for the ordered procedure, I authorize the administration of contrast media per Radiology protocol:   Yes    Does the patient have a contrast media/X-ray dye allergy?:   No    Preferred imaging location?:   [facility]   CT SOFT TISSUE NECK W CONTRAST    Standing Status:   Future    Expected Date:   01/04/2024    Expiration Date:   12/20/2024    If indicated for the ordered procedure, I authorize the administration of contrast media per Radiology protocol:   Yes    Does the patient have a contrast media/X-ray dye allergy?:   No    Preferred imaging location?:   [facility] R Teague,acting as a scribe for  [name], MD.,have documented all relevant documentation on the behalf of [name], MD,as directed by  [name], MD while in the presence of [name], MD.  I, [name] MD, have reviewed the above documentation for accuracy and completeness, and I agree with the above.      [name], MD   3/11/20254:05 PM  CHIEF COMPLAINT:   Diagnosis: metastatic squamous cell carcinoma    Cancer Staging  Squamous cell carcinoma of head and neck Staging form: Cervical Lymph Nodes and Unknown Primary Tumors of the Head and Neck, AJCC 8th Edition - Clinical stage from 07/16/2022: Stage IVC (cT0, cN3b, cM1) - Unsigned    Prior Therapy: Carboplatin, paclitaxel and pembrolizumab, 07/23/22 - 11/09/22   Current Therapy:  pembrolizumab    HISTORY OF PRESENT ILLNESS:   Oncology History  Squamous cell carcinoma of head and neck  07/16/2022 Initial Diagnosis   Squamous cell carcinoma of head and neck (HCC)   07/23/2022 - 09/27/2023 Chemotherapy   Patient is on Treatment Plan : Carboplatin  + Paclitaxel  + Pembrolizumab (200) D1 q21d     10/25/2023 -  Chemotherapy   Patient is on Treatment Plan :  HEAD/NECK Cetuximab q14d        INTERVAL HISTORY:   Henry is a 89 y.o. male presenting to clinic today for follow up of metastatic squamous cell carcinoma. He was last seen by me on 12/07/23.  Today, he states that he is doing well overall. His appetite level is at 100%. His energy level is at 0%. He is accompanied by his wife.   Juma had a recent fall after placing clindamycin cream on his feet and currently reports bilateral hip and back pain. He denies any injuries or loss of consciousness and did not go to the ED.   His rash has not improved since his last visit and is contained primarily to the chest and face. He is taking doxycycline pills and clindamycin gel as prescribed. The skin along his fingertips and heels are cracking. He denies  any diarrhea, nausea or vomiting.  Sherod notes a bump on the right axillary region. He also has intermittent excruciating right shoulder pain lasting a few minutes at a time before resolving.   2-3 cm right lower axillary lymph node Right posterior triangle and supraclavicular lymph nodes  PAST MEDICAL HISTORY:   Past Medical History: Past Medical History:  Diagnosis Date   Anemia    Atrial fibrillation (HCC)    Cataract    Cellulitis    In past   Complication of anesthesia    HARD TIME WAKING; CAUSES MY BP TO GO UP    Coronary artery disease    5 bypasses   DJD (degenerative joint disease)    Ejection fraction < 50%    Mildly reduced, 40% by echo   GERD (gastroesophageal reflux disease)    Hyperlipidemia    Hypertension    Persistent atrial fibrillation (HCC)    Prostate hypertrophy    on CT scan 09/2014   PUD (peptic ulcer disease)    RESOLVED    Skin cancer of eyelid    Resected    Surgical History: Past Surgical History:  Procedure Laterality Date   APPENDECTOMY     BYPASS GRAFT  2005   CATARACT EXTRACTION W/PHACO Left 07/22/2015   Procedure: CATARACT EXTRACTION PHACO AND INTRAOCULAR LENS PLACEMENT LEFT EYE CDE=8.14;  Surgeon: [name], MD;  Location: [facility];  Service: Ophthalmology;  Laterality: Left;   CATARACT EXTRACTION W/PHACO Right 08/18/2019   Procedure: CATARACT EXTRACTION PHACO AND INTRAOCULAR LENS PLACEMENT (IOC);  Surgeon: Fabio Pierce, MD;  Location: [facility];  Service: Ophthalmology;  Laterality: Right;  CDE: 9.74   CORONARY ARTERY BYPASS GRAFT  4/05   LIMA to LAD, SVG to PDA, SVG to ramus intermediate   EYE SURGERY  07/2015   EYELID CARCINOMA EXCISION     Skin cancer resected   Heart bypass  2005   LYMPH NODE BIOPSY Right 06/09/2022   Procedure: LYMPH NODE BIOPSY; supraclavicular;  Surgeon: Lucretia Roers, MD;  Location: [facility];  Service: General;  Laterality: Right;   PORTACATH PLACEMENT Left 06/09/2022   Procedure: INSERTION PORT-A-CATH;   Surgeon: Lucretia Roers, MD;  Location: [facility];  Service: General;  Laterality: Left;   TOTAL HIP ARTHROPLASTY     Right   TOTAL HIP ARTHROPLASTY Left 05/04/2018   Procedure: LEFT TOTAL HIP ARTHROPLASTY;  Surgeon: [name], MD;  Location: [facility];  Service: Orthopedics;  Laterality: Left;   TOTAL KNEE ARTHROPLASTY     Right    Social History: Social History   Socioeconomic History   Marital status: Married    Spouse  name: Talbert Forest   Number of children: 1   Years of education: 10   Highest education level: 10th grade  Occupational History   Occupation: retired  Tobacco Use   Smoking status: Former    Current packs/day: 0.00    Average packs/day: 2.0 packs/day for 5.0 years (10.0 ttl pk-yrs)    Types: Cigarettes    Start date: 12/23/1958    Quit date: 12/23/1963    Years since quitting: 60.0   Smokeless tobacco: Never  Vaping Use   Vaping status: Never Used  Substance and Sexual Activity   Alcohol use: No   Drug use: No   Sexual activity: Yes  Other Topics Concern   Not on file  Social History Narrative   Lives in a split level home.   Social Drivers of Corporate investment banker Strain: Low Risk  (10/25/2023)   Overall Financial Resource Strain (CARDIA)    Difficulty of Paying Living Expenses: Not hard at all  Food Insecurity: No Food Insecurity (10/25/2023)   Hunger Vital Sign    Worried About Running Out of Food in the Last Year: Never true    Ran Out of Food in the Last Year: Never true  Transportation Needs: No Transportation Needs (10/25/2023)   PRAPARE - Administrator, Civil Service (Medical): No    Lack of Transportation (Non-Medical): No  Physical Activity: Inactive (10/25/2023)   Exercise Vital Sign    Days of Exercise per Week: 1 day    Minutes of Exercise per Session: 0 min  Stress: No Stress Concern Present (10/25/2023)   [employer] of Occupational Health - Occupational Stress Questionnaire    Feeling of Stress : Only a little   Social Connections: Moderately Isolated (10/25/2023)   Social Connection and Isolation Panel [NHANES]    Frequency of Communication with Friends and Family: More than three times a week    Frequency of Social Gatherings with Friends and Family: Twice a week    Attends Religious Services: Never    Database administrator or Organizations: No    Attends Banker Meetings: Never    Marital Status: Married  Catering manager Violence: Not At Risk (01/06/2023)   Humiliation, Afraid, Rape, and Kick questionnaire    Fear of Current or Ex-Partner: No    Emotionally Abused: No    Physically Abused: No    Sexually Abused: No    Family History: Family History  Problem Relation Age of Onset   Stroke Mother    Stroke Father    Hypertension Father    Early death Brother        16 months old   Cancer Brother        lung   Stroke Brother        heat    Current Medications:  Current Outpatient Medications:    acetaminophen (TYLENOL) 500 MG tablet, Take 1,000 mg by mouth every 6 (six) hours as needed for moderate pain., Disp: , Rfl:    betamethasone dipropionate 0.05 % cream, Apply topically 2 (two) times daily., Disp: 30 g, Rfl: 3   Cholecalciferol (VITAMIN D3) 5000 units CAPS, Take 5,000 Units by mouth once a week. , Disp: , Rfl:    clindamycin (CLINDAGEL) 1 % gel, Apply topically 2 (two) times daily., Disp: 60 g, Rfl: 3   diltiazem (CARDIZEM CD) 120 MG 24 hr capsule, TAKE ONE (1) CAPSULE EACH DAY, Disp: 90 capsule, Rfl: 3   doxycycline (VIBRA-TABS) 100 MG  tablet, Take 1 tablet (100 mg total) by mouth 2 (two) times daily., Disp: 60 tablet, Rfl: 2   ezetimibe (ZETIA) 10 MG tablet, TAKE ONE (1) TABLET BY MOUTH EVERY DAY, Disp: 90 tablet, Rfl: 0   finasteride (PROSCAR) 5 MG tablet, TAKE ONE (1) TABLET EACH DAY, Disp: 90 tablet, Rfl: 3   fluticasone (FLONASE) 50 MCG/ACT nasal spray, Place 1 spray into both nostrils daily as needed for allergies or rhinitis., Disp: , Rfl:    furosemide  (LASIX) 20 MG tablet, Take 1 tablet (20 mg total) by mouth daily., Disp: 90 tablet, Rfl: 3   lovastatin (MEVACOR) 40 MG tablet, Take 1 tablet (40 mg total) by mouth at bedtime., Disp: 90 tablet, Rfl: 3   methylphenidate (RITALIN) 5 MG tablet, TAKE 1 TABLET BY MOUTH DAILY AS NEEDED, Disp: 30 tablet, Rfl: 0   metoprolol tartrate (LOPRESSOR) 50 MG tablet, Take 1 tablet (50 mg total) by mouth 2 (two) times daily., Disp: 180 tablet, Rfl: 3   montelukast (SINGULAIR) 10 MG tablet, Take 1 tablet (10 mg total) by mouth at bedtime., Disp: 90 tablet, Rfl: 3   predniSONE (DELTASONE) 5 MG tablet, Take 1 tablet (5 mg total) by mouth daily with breakfast., Disp: 90 tablet, Rfl: 2   warfarin (COUMADIN) 5 MG tablet, TAKE 1 TABLET ON SATURDAY, SUNDAY, TUESDAY, & THURSDAY. TAKE 1/2 TABLET ON MONDAY, WEDNESDAY, & FRIDAY, Disp: 70 tablet, Rfl: 0 No current facility-administered medications for this visit.  Facility-Administered Medications Ordered in Other Visits:    0.9 %  sodium chloride infusion, , Intravenous, Continuous, [name], MD, Stopped at 12/21/23 1545   sodium chloride flush (NS) 0.9 % injection 10 mL, 10 mL, Intracatheter, PRN, [name], MD, 10 mL at 12/21/23 1544   Allergies: Allergies  Allergen Reactions   Penicillins Hives and Rash    Has patient had a PCN reaction causing immediate rash, facial/tongue/throat swelling, SOB or lightheadedness with hypotension: Yes Has patient had a PCN reaction causing severe rash involving mucus membranes or skin necrosis: Yes Has patient had a PCN reaction that required hospitalization: No Has patient had a PCN reaction occurring within the last 10 years: No If all of the above answers are "NO", then may proceed with Cephalosporin use.    Cetuximab Cough    Cough and scratchy throat. Drug rechallenged and pt able to tolerated the rest of the infusion without any complications.    Hct [Hydrochlorothiazide] Other (See Comments)    hyper    Statins Other (See Comments)    Myalgia, tolerates low dosages of lovastatin     REVIEW OF SYSTEMS:   Review of Systems  Constitutional:  Negative for chills, fatigue and fever.  HENT:   Negative for lump/mass, mouth sores, nosebleeds, sore throat and trouble swallowing.   Eyes:  Negative for eye problems.  Respiratory:  Positive for cough and shortness of breath.   Cardiovascular:  Negative for chest pain, leg swelling and palpitations.  Gastrointestinal:  Positive for constipation. Negative for abdominal pain, diarrhea, nausea and vomiting.  Genitourinary:  Negative for bladder incontinence, difficulty urinating, dysuria, frequency, hematuria and nocturia.   Musculoskeletal:  Positive for back pain (5/10 severity). Negative for arthralgias, flank pain, myalgias and neck pain.       +bilateral hip pain, 5/10 severity  Skin:  Positive for rash. Negative for itching.       +skin cracking on fingertips  Neurological:  Positive for numbness (in feet). Negative for dizziness and headaches.  Hematological:  Does not bruise/bleed easily.  Psychiatric/Behavioral:  Negative for depression, sleep disturbance and suicidal ideas. The patient is not nervous/anxious.   All other systems reviewed and are negative.    VITALS:   There were no vitals taken for this visit.  Wt Readings from Last 3 Encounters:  12/21/23 180 lb (81.6 kg)  12/17/23 183 lb (83 kg)  12/07/23 183 lb 6.8 oz (83.2 kg)    There is no height or weight on file to calculate BMI.  Performance status (ECOG): 1 - Symptomatic but completely ambulatory  PHYSICAL EXAM:   Physical Exam Vitals and nursing note reviewed. Exam conducted with a chaperone present.  Constitutional:      Appearance: Normal appearance.  Cardiovascular:     Rate and Rhythm: Normal rate and regular rhythm.     Pulses: Normal pulses.     Heart sounds: Normal heart sounds.  Pulmonary:     Effort: Pulmonary effort is normal.     Breath sounds:  Normal breath sounds.  Abdominal:     Palpations: Abdomen is soft. There is no hepatomegaly, splenomegaly or mass.     Tenderness: There is no abdominal tenderness.  Musculoskeletal:     Right lower leg: No edema.     Left lower leg: No edema.  Lymphadenopathy:     Cervical: Cervical adenopathy present.     Right cervical: Superficial cervical adenopathy present. No deep or posterior cervical adenopathy.    Left cervical: No superficial, deep or posterior cervical adenopathy.     Upper Body:     Right upper body: Supraclavicular adenopathy and axillary adenopathy present.     Left upper body: No supraclavicular or axillary adenopathy.     Comments: +2-3 cm right lower axillary lymph node +Right posterior triangle and supraclavicular lymph nodes  Neurological:     General: No focal deficit present.     Mental Status: He is alert and oriented to person, place, and time.  Psychiatric:        Mood and Affect: Mood normal.        Behavior: Behavior normal.     LABS:      Latest Ref Rng & Units 12/21/2023   10:05 AM 12/07/2023    9:29 AM 11/23/2023    8:28 AM  CBC  WBC 4.0 - 10.5 K/uL 18.0  17.7  17.3   Hemoglobin 13.0 - 17.0 g/dL 16.1  09.6  04.5   Hematocrit 39.0 - 52.0 % 40.6  40.8  39.1   Platelets 150 - 400 K/uL 201  187  193       Latest Ref Rng & Units 12/21/2023   10:04 AM 12/07/2023    9:29 AM 11/23/2023    8:27 AM  CMP  Glucose 70 - 99 mg/dL 409  811  914   BUN 8 - 23 mg/dL 18  19  21    Creatinine 0.61 - 1.24 mg/dL 7.82  9.56  2.13   Sodium 135 - 145 mmol/L 143  138  137   Potassium 3.5 - 5.1 mmol/L 3.4  3.5  3.3   Chloride 98 - 111 mmol/L 106  104  103   CO2 22 - 32 mmol/L 27  27  27    Calcium 8.9 - 10.3 mg/dL 8.9  8.8  8.7   Total Protein 6.5 - 8.1 g/dL 5.8  5.9  5.7   Total Bilirubin 0.0 - 1.2 mg/dL 0.8  0.7  0.6   Alkaline Phos 38 - 126 U/L 60  54  51   AST 15 - 41 U/L 19  28  21    ALT 0 - 44 U/L 23  29  23       Lab Results  Component Value Date   CEA1  5.4 (H) 06/03/2022   /  CEA  Date Value Ref Range Status  06/03/2022 5.4 (H) 0.0 - 4.7 ng/mL Final    Comment:    (NOTE)                             Nonsmokers          <3.9                             Smokers             <5.6 Roche [employer] Electrochemiluminescence Immunoassay (ECLIA) Values obtained with different assay methods or kits cannot be used interchangeably.  Results cannot be interpreted as absolute evidence of the presence or absence of malignant disease. Performed At: [facility] [address] [place], [place] [id number] Jolene Schimke MD NW:[id number]    Lab Results  Component Value Date   PSA1 11.3 (H) 02/16/2022   Lab Results  Component Value Date   CAN199 62 (H) 06/03/2022   No results found for: "CAN125"  No results found for: "TOTALPROTELP", "ALBUMINELP", "A1GS", "A2GS", "BETS", "BETA2SER", "GAMS", "MSPIKE", "SPEI" No results found for: "TIBC", "FERRITIN", "IRONPCTSAT" Lab Results  Component Value Date   LDH 164 06/03/2022     STUDIES:   No results found.

## 2023-12-21 NOTE — Patient Instructions (Signed)

## 2023-12-21 NOTE — Patient Instructions (Signed)
 [facility] - A DEPT OF [facility]  Discharge Instructions: Thank you for choosing [facility] to provide your oncology and hematology care.  If you have a lab appointment with the Cancer Center - please note that after April 8th, 2024, all labs will be drawn in the cancer center.  You do not have to check in or register with the main entrance as you have in the past but will complete your check-in in the cancer center.  Wear comfortable clothing and clothing appropriate for easy access to any Portacath or PICC line.   We strive to give you quality time with your provider. You may need to reschedule your appointment if you arrive late (15 or more minutes).  Arriving late affects you and other patients whose appointments are after yours.  Also, if you miss three or more appointments without notifying the office, you may be dismissed from the clinic at the provider's discretion.      For prescription refill requests, have your pharmacy contact our office and allow 72 hours for refills to be completed.    Today you received the following chemotherapy and/or immunotherapy agents Erbitux.  Cetuximab Injection What is this medication? CETUXIMAB (se TUX i mab) treats head and neck cancer. It may also be used to treat colorectal cancer. It works by blocking a protein that causes cancer cells to grow and multiply. This helps to slow or stop the spread of cancer cells. It is a monoclonal antibody. This medicine may be used for other purposes; ask your health care provider or pharmacist if you have questions. COMMON BRAND NAME(S): Erbitux What should I tell my care team before I take this medication? They need to know if you have any of these conditions: Heart disease History of tick bites Low levels of calcium, magnesium, or potassium in the blood Lung disease Red meat allergy An unusual or allergic reaction to cetuximab, other medications, foods, dyes,  or preservatives Pregnant or trying to get pregnant Breast-feeding How should I use this medication? This medication is infused into a vein. It is given by your care team in a hospital or clinic setting. Talk to your care team about the use of this medication in children. Special care may be needed. Overdosage: If you think you have taken too much of this medicine contact a poison control center or emergency room at once. NOTE: This medicine is only for you. Do not share this medicine with others. What if I miss a dose? Keep appointments for follow-up doses. It is important not to miss your dose. Call your care team if you are unable to keep an appointment. What may interact with this medication? Interactions are not expected. This list may not describe all possible interactions. Give your health care provider a list of all the medicines, herbs, non-prescription drugs, or dietary supplements you use. Also tell them if you smoke, drink alcohol, or use illegal drugs. Some items may interact with your medicine. What should I watch for while using this medication? Visit your care team for regular checks on your progress. This medication may make you feel generally unwell. This is not uncommon, as chemotherapy can affect healthy cells as well as cancer cells. Report any side effects. Continue your course of treatment even though you feel ill unless your care team tells you to stop. You may need blood work done while you are taking this medication. This medication can make you more sensitive  to the sun. Keep out of the sun while taking this medication and for 2 months after the last dose. If you cannot avoid being in the sun, wear protective clothing and sunscreen. Do not use sun lamps, tanning beds, or tanning booths. This medication can cause serious infusion reactions. To reduce the risk, your care team may give you other medications to take before receiving this one. Be sure to follow the directions of  your care team. This medication may cause serious skin reactions. They can happen weeks to months after starting the medication. Contact your care team right away if you notice fevers or flu-like symptoms with a rash. The rash may be red or purple and then turn into blisters or peeling of the skin. You may also notice a red rash with swelling of the face, lips, or lymph nodes in your neck or under your arms. Talk to your care team if you may be pregnant. Serious birth defects can occur if you take this medication during pregnancy and for 2 months after the last dose. You will need a negative pregnancy test before starting this medication. Contraception is recommended while taking this medication and for 2 months after the last dose. Your care team can help you find the option that works for you. Do not breastfeed while taking this medication and for 2 months after the last dose. This medication may cause infertility. Talk to your care team if you are concerned about your fertility. What side effects may I notice from receiving this medication? Side effects that you should report to your care team as soon as possible: Allergic reactions--skin rash, itching, hives, swelling of the face, lips, tongue, or throat Dry cough, shortness of breath or trouble breathing Heart attack--pain or tightness in the chest, shoulders, arms, or jaw, nausea, shortness of breath, cold or clammy skin, feeling faint or lightheaded Infusion reactions--chest pain, shortness of breath or trouble breathing, feeling faint or lightheaded Low calcium level--muscle pain or cramps, confusion, tingling, or numbness in the hands or feet Low magnesium level--muscle pain or cramps, unusual weakness or fatigue, fast or irregular heartbeat, tremors Low potassium level--muscle pain or cramps, unusual weakness or fatigue, fast or irregular heartbeat, constipation Redness, blistering, peeling, or loosening of the skin, including inside the  mouth Side effects that usually do not require medical attention (report to your care team if they continue or are bothersome): Diarrhea Headache Joint pain Nausea Unusual weakness or fatigue Weight loss This list may not describe all possible side effects. Call your doctor for medical advice about side effects. You may report side effects to FDA at 1-800-FDA-1088. Where should I keep my medication? This medication is given in a hospital or clinic. It will not be stored at home. NOTE: This sheet is a summary. It may not cover all possible information. If you have questions about this medicine, talk to your doctor, pharmacist, or health care provider.  2024 Elsevier/Gold Standard (2022-02-13 00:00:00)       To help prevent nausea and vomiting after your treatment, we encourage you to take your nausea medication as directed.  BELOW ARE SYMPTOMS THAT SHOULD BE REPORTED IMMEDIATELY: *FEVER GREATER THAN 100.4 F (38 C) OR HIGHER *CHILLS OR SWEATING *NAUSEA AND VOMITING THAT IS NOT CONTROLLED WITH YOUR NAUSEA MEDICATION *UNUSUAL SHORTNESS OF BREATH *UNUSUAL BRUISING OR BLEEDING *URINARY PROBLEMS (pain or burning when urinating, or frequent urination) *BOWEL PROBLEMS (unusual diarrhea, constipation, pain near the anus) TENDERNESS IN MOUTH AND THROAT WITH OR WITHOUT PRESENCE  OF ULCERS (sore throat, sores in mouth, or a toothache) UNUSUAL RASH, SWELLING OR PAIN  UNUSUAL VAGINAL DISCHARGE OR ITCHING   Items with * indicate a potential emergency and should be followed up as soon as possible or go to the Emergency Department if any problems should occur.  Please show the CHEMOTHERAPY ALERT CARD or IMMUNOTHERAPY ALERT CARD at check-in to the Emergency Department and triage nurse.  Should you have questions after your visit or need to cancel or reschedule your appointment, please contact [facility] [place] - A DEPT OF Eligha Brideg[facility] [phone]  and follow the prompts.   Office hours are 8:00 a.m. to 4:30 p.m. Monday - Friday. Please note that voicemails left after 4:00 p.m. may not be returned until the following business day.  We are closed weekends and major holidays. You have access to a nurse at all times for urgent questions. Please call the main number to the clinic [phone] and follow the prompts.  For any non-urgent questions, you may also contact your provider using MyChart. We now offer e-Visits for anyone 82 and older to request care online for non-urgent symptoms. For details visit [email or web address].   Also download the MyChart app! Go to the app store, search "MyChart", open the app, select [facility], and log in with your MyChart username and password.

## 2023-12-23 ENCOUNTER — Other Ambulatory Visit: Payer: Self-pay | Admitting: Hematology

## 2023-12-30 ENCOUNTER — Ambulatory Visit (HOSPITAL_COMMUNITY)
Admission: RE | Admit: 2023-12-30 | Discharge: 2023-12-30 | Disposition: A | Discharge status: Home / Self Care | Source: Ambulatory Visit | Attending: Hematology | Admitting: Hematology

## 2023-12-30 DIAGNOSIS — R918 Other nonspecific abnormal finding of lung field: Secondary | ICD-10-CM | POA: Insufficient documentation

## 2023-12-30 DIAGNOSIS — R591 Generalized enlarged lymph nodes: Secondary | ICD-10-CM | POA: Diagnosis not present

## 2023-12-30 DIAGNOSIS — R590 Localized enlarged lymph nodes: Secondary | ICD-10-CM | POA: Diagnosis not present

## 2023-12-30 DIAGNOSIS — C4492 Squamous cell carcinoma of skin, unspecified: Secondary | ICD-10-CM | POA: Diagnosis not present

## 2023-12-30 DIAGNOSIS — M47812 Spondylosis without myelopathy or radiculopathy, cervical region: Secondary | ICD-10-CM | POA: Diagnosis not present

## 2023-12-30 DIAGNOSIS — C7989 Secondary malignant neoplasm of other specified sites: Secondary | ICD-10-CM | POA: Insufficient documentation

## 2023-12-30 DIAGNOSIS — J8410 Pulmonary fibrosis, unspecified: Secondary | ICD-10-CM | POA: Diagnosis not present

## 2023-12-30 DIAGNOSIS — I700 Atherosclerosis of aorta: Secondary | ICD-10-CM | POA: Diagnosis not present

## 2023-12-30 DIAGNOSIS — C4442 Squamous cell carcinoma of skin of scalp and neck: Secondary | ICD-10-CM | POA: Diagnosis not present

## 2023-12-30 MED ORDER — IOHEXOL 300 MG/ML  SOLN
75.0000 mL | Freq: Once | INTRAMUSCULAR | Status: AC | PRN
Start: 2023-12-30 — End: 2023-12-30
  Administered 2023-12-30: 75 mL via INTRAVENOUS

## 2024-01-04 ENCOUNTER — Inpatient Hospital Stay: Payer: Medicare Other

## 2024-01-04 ENCOUNTER — Inpatient Hospital Stay: Payer: Medicare Other | Admitting: Hematology

## 2024-01-04 VITALS — BP 119/84 | HR 89 | Temp 96.8°F | Resp 20 | Wt 176.4 lb

## 2024-01-04 DIAGNOSIS — Z5111 Encounter for antineoplastic chemotherapy: Secondary | ICD-10-CM | POA: Diagnosis not present

## 2024-01-04 DIAGNOSIS — C4442 Squamous cell carcinoma of skin of scalp and neck: Secondary | ICD-10-CM | POA: Diagnosis not present

## 2024-01-04 DIAGNOSIS — Z87891 Personal history of nicotine dependence: Secondary | ICD-10-CM | POA: Diagnosis not present

## 2024-01-04 DIAGNOSIS — R918 Other nonspecific abnormal finding of lung field: Secondary | ICD-10-CM | POA: Diagnosis not present

## 2024-01-04 DIAGNOSIS — C760 Malignant neoplasm of head, face and neck: Secondary | ICD-10-CM | POA: Diagnosis not present

## 2024-01-04 DIAGNOSIS — Z95828 Presence of other vascular implants and grafts: Secondary | ICD-10-CM

## 2024-01-04 DIAGNOSIS — D72820 Lymphocytosis (symptomatic): Secondary | ICD-10-CM | POA: Diagnosis not present

## 2024-01-04 DIAGNOSIS — E876 Hypokalemia: Secondary | ICD-10-CM

## 2024-01-04 DIAGNOSIS — R530 Neoplastic (malignant) related fatigue: Secondary | ICD-10-CM | POA: Diagnosis not present

## 2024-01-04 DIAGNOSIS — R21 Rash and other nonspecific skin eruption: Secondary | ICD-10-CM | POA: Diagnosis not present

## 2024-01-04 DIAGNOSIS — C778 Secondary and unspecified malignant neoplasm of lymph nodes of multiple regions: Secondary | ICD-10-CM | POA: Diagnosis not present

## 2024-01-04 DIAGNOSIS — Z801 Family history of malignant neoplasm of trachea, bronchus and lung: Secondary | ICD-10-CM | POA: Diagnosis not present

## 2024-01-04 LAB — CBC WITH DIFFERENTIAL/PLATELET
Abs Immature Granulocytes: 0.1 10*3/uL — ABNORMAL HIGH (ref 0.00–0.07)
Basophils Absolute: 0.1 10*3/uL (ref 0.0–0.1)
Basophils Relative: 0 %
Eosinophils Absolute: 0.3 10*3/uL (ref 0.0–0.5)
Eosinophils Relative: 2 %
HCT: 42.3 % (ref 39.0–52.0)
Hemoglobin: 13.5 g/dL (ref 13.0–17.0)
Immature Granulocytes: 1 %
Lymphocytes Relative: 25 %
Lymphs Abs: 3.8 10*3/uL (ref 0.7–4.0)
MCH: 33.1 pg (ref 26.0–34.0)
MCHC: 31.9 g/dL (ref 30.0–36.0)
MCV: 103.7 fL — ABNORMAL HIGH (ref 80.0–100.0)
Monocytes Absolute: 0.8 10*3/uL (ref 0.1–1.0)
Monocytes Relative: 5 %
Neutro Abs: 10.1 10*3/uL — ABNORMAL HIGH (ref 1.7–7.7)
Neutrophils Relative %: 67 %
Platelets: 250 10*3/uL (ref 150–400)
RBC: 4.08 MIL/uL — ABNORMAL LOW (ref 4.22–5.81)
RDW: 13.8 % (ref 11.5–15.5)
WBC: 15.1 10*3/uL — ABNORMAL HIGH (ref 4.0–10.5)
nRBC: 0 % (ref 0.0–0.2)

## 2024-01-04 LAB — COMPREHENSIVE METABOLIC PANEL
ALT: 24 U/L (ref 0–44)
AST: 24 U/L (ref 15–41)
Albumin: 3.2 g/dL — ABNORMAL LOW (ref 3.5–5.0)
Alkaline Phosphatase: 113 U/L (ref 38–126)
Anion gap: 10 (ref 5–15)
BUN: 17 mg/dL (ref 8–23)
CO2: 27 mmol/L (ref 22–32)
Calcium: 8.8 mg/dL — ABNORMAL LOW (ref 8.9–10.3)
Chloride: 105 mmol/L (ref 98–111)
Creatinine, Ser: 1.07 mg/dL (ref 0.61–1.24)
GFR, Estimated: >60 mL/min (ref >60)
Glucose, Bld: 152 mg/dL — ABNORMAL HIGH (ref 70–99)
Potassium: 3.4 mmol/L — ABNORMAL LOW (ref 3.5–5.1)
Sodium: 142 mmol/L (ref 135–145)
Total Bilirubin: 0.7 mg/dL (ref 0.0–1.2)
Total Protein: 6.1 g/dL — ABNORMAL LOW (ref 6.5–8.1)

## 2024-01-04 LAB — MAGNESIUM: Magnesium: 1.5 mg/dL — ABNORMAL LOW (ref 1.7–2.4)

## 2024-01-04 MED ORDER — MAGNESIUM OXIDE -MG SUPPLEMENT 400 (240 MG) MG PO TABS: 400.0000 mg | ORAL_TABLET | Freq: Two times a day (BID) | ORAL | 3 refills | Status: DC

## 2024-01-04 MED ORDER — SODIUM CHLORIDE FLUSH 0.9 % IV SOLN
10.0000 mL | Freq: Once | INTRAVENOUS | Status: AC
  Administered 2024-01-04: 10 mL via INTRAVENOUS

## 2024-01-04 MED ORDER — SODIUM CHLORIDE 0.9 % IV SOLN: INTRAVENOUS | Status: DC

## 2024-01-04 MED ORDER — POTASSIUM CHLORIDE CRYS ER 20 MEQ PO TBCR
40.0000 meq | EXTENDED_RELEASE_TABLET | Freq: Once | ORAL | Status: AC
  Administered 2024-01-04: 40 meq via ORAL
  Filled 2024-01-04: qty 2

## 2024-01-04 MED ORDER — HEPARIN SOD (PORK) LOCK FLUSH 100 UNIT/ML IV SOLN
500.0000 [IU] | Freq: Once | INTRAVENOUS | Status: AC
  Administered 2024-01-04: 500 [IU] via INTRAVENOUS

## 2024-01-04 MED ORDER — MAGNESIUM SULFATE 2 GM/50ML IV SOLN
2.0000 g | Freq: Once | INTRAVENOUS | Status: AC
  Administered 2024-01-04: 2 g via INTRAVENOUS
  Filled 2024-01-04: qty 50

## 2024-01-04 NOTE — Patient Instructions (Addendum)
 [facility] at [facility] Discharge Instructions   You were seen and examined today by Dr. Ellin Saba.  He reviewed the results of your lab work which are mostly normal/stable. Your magnesium is low today. We will give you IV magnesium in the clinic today. Dr. Kirtland Bouchard also sent a prescription of magnesium. Take 2 pills twice a day.   The results of your CT scans are pending.   We will hold your treatment today due to the rash.   Return as scheduled.    Thank you for choosing [facility] at [facility] to provide your oncology and hematology care.  To afford each patient quality time with our provider, please arrive at least 15 minutes before your scheduled appointment time.   If you have a lab appointment with the Cancer Center please come in thru the Main Entrance and check in at the main information desk.  You need to re-schedule your appointment should you arrive 10 or more minutes late.  We strive to give you quality time with our providers, and arriving late affects you and other patients whose appointments are after yours.  Also, if you no show three or more times for appointments you may be dismissed from the clinic at the providers discretion.     Again, thank you for choosing [facility].  Our hope is that these requests will decrease the amount of time that you wait before being seen by our physicians.       _____________________________________________________________  Should you have questions after your visit to [facility], please contact our office at [phone] and follow the prompts.  Our office hours are 8:00 a.m. and 4:30 p.m. Monday - Friday.  Please note that voicemails left after 4:00 p.m. may not be returned until the following business day.  We are closed weekends and major holidays.  You do have access to a nurse 24-7, just call the main number to the clinic [phone] and do not press any options,  hold on the line and a nurse will answer the phone.    For prescription refill requests, have your pharmacy contact our office and allow 72 hours.    Due to Covid, you will need to wear a mask upon entering the hospital. If you do not have a mask, a mask will be given to you at the Main Entrance upon arrival. For doctor visits, patients may have 1 support person age 90 or older with them. For treatment visits, patients can not have anyone with them due to social distancing guidelines and our immunocompromised population.

## 2024-01-04 NOTE — Progress Notes (Signed)
 [facility] [address] [place] [address]    Clinic Day:  01/04/2024  Referring physician: [name], Elige Radon, MD  Patient Care Team: [name], Elige Radon, MD as PCP - General (Family Medicine) [name], MD as PCP - Cardiology (Cardiology) Bjorn Pippin, MD as Attending Physician (Urology) [name], MD as Consulting Physician (Cardiology) [name], MD (Inactive) as Consulting Physician (Gastroenterology) [name], MD as Consulting Physician (Ophthalmology) [name], MD as Consulting Physician (Orthopedic Surgery) [name], [name], LCSW as [facility] Care Management (Licensed Clinical Social Worker) [name], MD as Medical Oncologist (Medical Oncology) [name], RN as Oncology Nurse Navigator (Medical Oncology)   ASSESSMENT & PLAN:   Assessment: 1.  Right supraclavicular lymphadenopathy and multiple lung nodules: - Patient noticed right neck mass since Christmas 2022.  10 to 15 pound weight loss since January 2023.  Denies any dysphagia or odynophagia. - CT neck (05/27/2022): Numerous lymph nodes in the right neck, right supraclavicular lymph node mass measures 5.7 x 3.7 cm.  18 mm posterior lymph node in the right lower neck.  Numerous lymph nodes in the right lower and posterior neck approximately 1 cm.  Many have internal necrosis consistent with metastatic disease.  20 mm lymph node just above the right clavicle.  Left level 2 node 12 mm. - CT CAP (05/28/2022): Several bilateral lung nodules, RUL nodule measuring 1.8 x 1.3 cm.  Small clusters of nodules in the medial left upper lobe.  Irregular nodular density along the left side of the mediastinum measuring 2.1 cm.  No metastatic disease in the abdomen or pelvis.  Liver is slightly nodular contour. - Lab work shows elevated white count since 2009, predominantly lymphocytosis. - Pathology (06/09/2022): Metastatic carcinoma positive for p40, p63  and CK5/6-patchy positivity with CK7.  Negative for TTF-1, Napsin A, PAX8, prostein, PSA, CK20 and CDX2.  - PET scan (06/11/2022): Bulky right supraclavicular nodal mass, smaller hypermetabolic right posterior triangle and jugular lymph nodes and single hypermetabolic left level 3 lymph node.  Bilateral hypermetabolic pulmonary nodules and metastatic pattern.  Minimal hypermetabolic mediastinal nodal metastasis.  Cluster of hypermetabolic right axillary lymph nodes.  No evidence of metastatic disease or primary lesion in the abdomen or pelvis.  No bone lesions. - MRI of the brain: Chronic small vessel ischemic changes with no evidence of metastatic disease. - NGS: T p53 pathogenic variant, MSI-stable, TMB-low, LOH-low, PD-L1 ([id number]): Negative, p16 and p18 negative.  HER2 by IHC 0. - PD-L1 22 C3: CPS score 25 - Cancer type ID: 96% probability squamous cell carcinoma, subtype head and neck/skin.  Cancer types ruled out with 95% confidence includes skin basal cell carcinoma. - Carboplatin, Taxol and pembrolizumab 07/23/2022 through 11/09/22 - Right axillary lymph node biopsy (08/04/2023): Metastatic moderate to poorly differentiated squamous cell carcinoma. - Cetuximab every 2 weeks started on 10/25/2023   2.  Social/family history: - He lives at home with his wife.  Independent of ADLs and IADLs.  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  May have had asbestos exposure 30 years ago.  Quit smoking cigarettes in 1965.  Smoked 1 pack/day for less than 12 years.  2 of his brothers had lung cancer and both were smokers.  3.  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    Plan: 1.  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  He is tolerating it reasonably well other than the skin rash. - He had scans done on 12/30/2023 which  have not been read. - Labs today: Normal LFTs.  CBC grossly normal. - His rash gotten worse.  Will hold cetuximab today.  Will reevaluate in 1 week.    2.  EGFR antibody induced rash: - Continue doxycycline twice daily.  Continue clindamycin twice daily.  Rash is slightly worse on the upper chest, face and right eyelid.  Will hold Erbitux today.   3.  Cancer-related fatigue: -Continue Ritalin 5 mg in the mornings as needed.  4.  Polyarthralgias: - Continue prednisone 5 mg daily which is helping with polyarthralgias.  5.  Hypomagnesemia: - Magnesium is low at 1.5.  He will receive IV magnesium.  Will start him on magnesium 400 mg twice daily.    No orders of the defined types were placed in this encounter.    Alben Deeds Teague,acting as a Neurosurgeon for [name], MD.,have documented all relevant documentation on the behalf of [name], MD,as directed by  [name], MD while in the presence of [name], MD.  I, [name] MD, have reviewed the above documentation for accuracy and completeness, and I agree with the above.     [name], MD   3/25/20251:03 PM  CHIEF COMPLAINT:   Diagnosis: metastatic squamous cell carcinoma    Cancer Staging  Squamous cell carcinoma of head and neck Staging form: Cervical Lymph Nodes and Unknown Primary Tumors of the Head and Neck, AJCC 8th Edition - Clinical stage from 07/16/2022: Stage IVC (cT0, cN3b, cM1) - Unsigned    Prior Therapy: Carboplatin, paclitaxel and pembrolizumab, 07/23/22 - 11/09/22   Current Therapy:  pembrolizumab    HISTORY OF PRESENT ILLNESS:   Oncology History  Squamous cell carcinoma of head and neck  07/16/2022 Initial Diagnosis   Squamous cell carcinoma of head and neck (HCC)   07/23/2022 - 09/27/2023 Chemotherapy   Patient is on Treatment Plan : Carboplatin  + Paclitaxel  + Pembrolizumab (200) D1 q21d     10/25/2023 -  Chemotherapy   Patient is on  Treatment Plan : HEAD/NECK Cetuximab q14d        INTERVAL HISTORY:   [name] is a 89 y.o. male presenting to clinic today for follow up of metastatic squamous cell carcinoma. He was last seen by me on 12/21/23.  Since his last visit, he underwent CT soft tissue neck and CT chest on 12/30/23.   Today, he states that he is doing well overall. His appetite level is at 80-90%. His energy level is at 0%. He is accompanied by his wife.   Kalel states he as a rash on the face and upper chest. He notes a particular nodule in his right eyelid that has been present for the past 10 days and is causing eye irritation. Skin splitting on the fingers and heel  have not improved. He is taking doxycycline and clindamycin cream as prescribed.   PAST MEDICAL HISTORY:   Past Medical History: Past Medical History:  Diagnosis Date   Anemia    Atrial fibrillation (HCC)    Cataract    Cellulitis    In past   Complication of anesthesia    HARD TIME WAKING; CAUSES MY BP TO GO UP    Coronary artery disease    5 bypasses   DJD (degenerative joint disease)    Ejection fraction < 50%    Mildly  reduced, 40% by echo   GERD (gastroesophageal reflux disease)    Hyperlipidemia    Hypertension    Persistent atrial fibrillation (HCC)    Prostate hypertrophy    on CT scan 09/2014   PUD (peptic ulcer disease)    RESOLVED    Skin cancer of eyelid    Resected    Surgical History: Past Surgical History:  Procedure Laterality Date   APPENDECTOMY     BYPASS GRAFT  2005   CATARACT EXTRACTION W/PHACO Left 07/22/2015   Procedure: CATARACT EXTRACTION PHACO AND INTRAOCULAR LENS PLACEMENT LEFT EYE CDE=8.14;  Surgeon: [name], MD;  Location: [facility];  Service: Ophthalmology;  Laterality: Left;   CATARACT EXTRACTION W/PHACO Right 08/18/2019   Procedure: CATARACT EXTRACTION PHACO AND INTRAOCULAR LENS PLACEMENT (IOC);  Surgeon: Fabio Pierce, MD;  Location: [facility];  Service: Ophthalmology;  Laterality: Right;  CDE: 9.74    CORONARY ARTERY BYPASS GRAFT  4/05   LIMA to LAD, SVG to PDA, SVG to ramus intermediate   EYE SURGERY  07/2015   EYELID CARCINOMA EXCISION     Skin cancer resected   Heart bypass  2005   LYMPH NODE BIOPSY Right 06/09/2022   Procedure: LYMPH NODE BIOPSY; supraclavicular;  Surgeon: Lucretia Roers, MD;  Location: [facility];  Service: General;  Laterality: Right;   PORTACATH PLACEMENT Left 06/09/2022   Procedure: INSERTION PORT-A-CATH;  Surgeon: Lucretia Roers, MD;  Location: [facility];  Service: General;  Laterality: Left;   TOTAL HIP ARTHROPLASTY     Right   TOTAL HIP ARTHROPLASTY Left 05/04/2018   Procedure: LEFT TOTAL HIP ARTHROPLASTY;  Surgeon: [name], MD;  Location: [facility];  Service: Orthopedics;  Laterality: Left;   TOTAL KNEE ARTHROPLASTY     Right    Social History: Social History   Socioeconomic History   Marital status: Married    Spouse name: Talbert Forest   Number of children: 1   Years of education: 10   Highest education level: 10th grade  Occupational History   Occupation: retired  Tobacco Use   Smoking status: Former    Current packs/day: 0.00    Average packs/day: 2.0 packs/day for 5.0 years (10.0 ttl pk-yrs)    Types: Cigarettes    Start date: 12/23/1958    Quit date: 12/23/1963    Years since quitting: 60.0   Smokeless tobacco: Never  Vaping Use   Vaping status: Never Used  Substance and Sexual Activity   Alcohol use: No   Drug use: No   Sexual activity: Yes  Other Topics Concern   Not on file  Social History Narrative   Lives in a split level home.   Social Drivers of Corporate investment banker Strain: Low Risk  (10/25/2023)   Overall Financial Resource Strain (CARDIA)    Difficulty of Paying Living Expenses: Not hard at all  Food Insecurity: No Food Insecurity (10/25/2023)   Hunger Vital Sign    Worried About Running Out of Food in the Last Year: Never true    Ran Out of Food in the Last Year: Never true  Transportation Needs: No  Transportation Needs (10/25/2023)   PRAPARE - Administrator, Civil Service (Medical): No    Lack of Transportation (Non-Medical): No  Physical Activity: Inactive (10/25/2023)   Exercise Vital Sign    Days of Exercise per Week: 1 day    Minutes of Exercise per Session: 0 min  Stress: No Stress Concern Present (10/25/2023)   [place]  [employer]stitute of Occupational Health - Occupational Stress Questionnaire    Feeling of Stress : Only a little  Social Connections: Moderately Isolated (10/25/2023)   Social Connection and Isolation Panel [NHANES]    Frequency of Communication with Friends and Family: More than three times a week    Frequency of Social Gatherings with Friends and Family: Twice a week    Attends Religious Services: Never    Database administrator or Organizations: No    Attends Banker Meetings: Never    Marital Status: Married  Catering manager Violence: Not At Risk (01/06/2023)   Humiliation, Afraid, Rape, and Kick questionnaire    Fear of Current or Ex-Partner: No    Emotionally Abused: No    Physically Abused: No    Sexually Abused: No    Family History: Family History  Problem Relation Age of Onset   Stroke Mother    Stroke Father    Hypertension Father    Early death Brother        54 months old   Cancer Brother        lung   Stroke Brother        heat    Current Medications:  Current Outpatient Medications:    acetaminophen (TYLENOL) 500 MG tablet, Take 1,000 mg by mouth every 6 (six) hours as needed for moderate pain., Disp: , Rfl:    betamethasone dipropionate 0.05 % cream, Apply topically 2 (two) times daily., Disp: 30 g, Rfl: 3   Cholecalciferol (VITAMIN D3) 5000 units CAPS, Take 5,000 Units by mouth once a week. , Disp: , Rfl:    clindamycin (CLINDAGEL) 1 % gel, Apply topically 2 (two) times daily., Disp: 60 g, Rfl: 3   diltiazem (CARDIZEM CD) 120 MG 24 hr capsule, TAKE ONE (1) CAPSULE EACH DAY, Disp: 90 capsule, Rfl: 3    doxycycline (VIBRA-TABS) 100 MG tablet, TAKE ONE (1) TABLET BY MOUTH TWO (2) TIMES DAILY, Disp: 60 tablet, Rfl: 2   ezetimibe (ZETIA) 10 MG tablet, TAKE ONE (1) TABLET BY MOUTH EVERY DAY, Disp: 90 tablet, Rfl: 0   finasteride (PROSCAR) 5 MG tablet, TAKE ONE (1) TABLET EACH DAY, Disp: 90 tablet, Rfl: 3   fluticasone (FLONASE) 50 MCG/ACT nasal spray, Place 1 spray into both nostrils daily as needed for allergies or rhinitis., Disp: , Rfl:    furosemide (LASIX) 20 MG tablet, Take 1 tablet (20 mg total) by mouth daily., Disp: 90 tablet, Rfl: 3   lovastatin (MEVACOR) 40 MG tablet, Take 1 tablet (40 mg total) by mouth at bedtime., Disp: 90 tablet, Rfl: 3   magnesium oxide (MAG-OX) 400 (240 Mg) MG tablet, Take 1 tablet (400 mg total) by mouth 2 (two) times daily., Disp: 60 tablet, Rfl: 3   methylphenidate (RITALIN) 5 MG tablet, TAKE 1 TABLET BY MOUTH DAILY AS NEEDED, Disp: 30 tablet, Rfl: 0   metoprolol tartrate (LOPRESSOR) 50 MG tablet, Take 1 tablet (50 mg total) by mouth 2 (two) times daily., Disp: 180 tablet, Rfl: 3   montelukast (SINGULAIR) 10 MG tablet, Take 1 tablet (10 mg total) by mouth at bedtime., Disp: 90 tablet, Rfl: 3   predniSONE (DELTASONE) 5 MG tablet, Take 1 tablet (5 mg total) by mouth daily with breakfast., Disp: 90 tablet, Rfl: 2   warfarin (COUMADIN) 5 MG tablet, TAKE 1 TABLET ON SATURDAY, SUNDAY, TUESDAY, & THURSDAY. TAKE 1/2 TABLET ON MONDAY, WEDNESDAY, & FRIDAY, Disp: 70 tablet, Rfl: 0 No current facility-administered medications for this visit.  Facility-Administered Medications Ordered in Other Visits:    0.9 %  sodium chloride infusion, , Intravenous, Continuous, [name], MD, Stopped at 01/04/24 1232   Allergies: Allergies  Allergen Reactions   Penicillins Hives and Rash    Has patient had a PCN reaction causing immediate rash, facial/tongue/throat swelling, SOB or lightheadedness with hypotension: Yes Has patient had a PCN reaction causing severe rash  involving mucus membranes or skin necrosis: Yes Has patient had a PCN reaction that required hospitalization: No Has patient had a PCN reaction occurring within the last 10 years: No If all of the above answers are "NO", then may proceed with Cephalosporin use.    Cetuximab Cough    Cough and scratchy throat. Drug rechallenged and pt able to tolerated the rest of the infusion without any complications.    Hct [Hydrochlorothiazide] Other (See Comments)    hyper   Statins Other (See Comments)    Myalgia, tolerates low dosages of lovastatin     REVIEW OF SYSTEMS:   Review of Systems  Constitutional:  Negative for chills, fatigue and fever.  HENT:   Negative for lump/mass, mouth sores, nosebleeds, sore throat and trouble swallowing.   Eyes:  Negative for eye problems.  Respiratory:  Negative for cough and shortness of breath.   Cardiovascular:  Negative for chest pain, leg swelling and palpitations.  Gastrointestinal:  Negative for abdominal pain, constipation, diarrhea, nausea and vomiting.  Genitourinary:  Positive for dysuria. Negative for bladder incontinence, difficulty urinating, frequency, hematuria and nocturia.   Musculoskeletal:  Negative for arthralgias, back pain, flank pain, myalgias and neck pain.       +hip and back pain from a fall, 6/10 severity  Skin:  Positive for rash (on chest, face, and head). Negative for itching.       +cracked heels  Neurological:  Positive for dizziness and numbness (in feet). Negative for headaches.  Hematological:  Does not bruise/bleed easily.  Psychiatric/Behavioral:  Negative for depression, sleep disturbance and suicidal ideas. The patient is not nervous/anxious.   All other systems reviewed and are negative.    VITALS:   There were no vitals taken for this visit.  Wt Readings from Last 3 Encounters:  01/04/24 176 lb 5.9 oz (80 kg)  12/21/23 180 lb (81.6 kg)  12/17/23 183 lb (83 kg)    There is no height or weight on file to  calculate BMI.  Performance status (ECOG): 1 - Symptomatic but completely ambulatory  PHYSICAL EXAM:   Physical Exam Vitals and nursing note reviewed. Exam conducted with a chaperone present.  Constitutional:      Appearance: Normal appearance.  Cardiovascular:     Rate and Rhythm: Normal rate and regular rhythm.     Pulses: Normal pulses.     Heart sounds: Normal heart sounds.  Pulmonary:     Effort: Pulmonary effort is normal.     Breath sounds: Normal breath sounds.  Abdominal:     Palpations: Abdomen is soft. There is no hepatomegaly, splenomegaly or mass.     Tenderness: There is no abdominal tenderness.  Musculoskeletal:     Right lower leg: No edema.     Left lower leg: No edema.  Lymphadenopathy:     Cervical: No cervical adenopathy.     Right cervical: No superficial, deep or posterior cervical adenopathy.    Left cervical: No superficial, deep or posterior cervical adenopathy.     Upper Body:     Right upper body: No supraclavicular or axillary  adenopathy.     Left upper body: No supraclavicular or axillary adenopathy.  Skin:    Comments: +morbilliform rash on chest and face   Neurological:     General: No focal deficit present.     Mental Status: He is alert and oriented to person, place, and time.  Psychiatric:        Mood and Affect: Mood normal.        Behavior: Behavior normal.     LABS:      Latest Ref Rng & Units 01/04/2024    9:47 AM 12/21/2023   10:05 AM 12/07/2023    9:29 AM  CBC  WBC 4.0 - 10.5 K/uL 15.1  18.0  17.7   Hemoglobin 13.0 - 17.0 g/dL 40.9  81.1  91.4   Hematocrit 39.0 - 52.0 % 42.3  40.6  40.8   Platelets 150 - 400 K/uL 250  201  187       Latest Ref Rng & Units 01/04/2024    9:47 AM 12/21/2023   10:04 AM 12/07/2023    9:29 AM  CMP  Glucose 70 - 99 mg/dL 782  956  213   BUN 8 - 23 mg/dL 17  18  19    Creatinine 0.61 - 1.24 mg/dL 0.86  5.78  4.69   Sodium 135 - 145 mmol/L 142  143  138   Potassium 3.5 - 5.1 mmol/L 3.4  3.4  3.5    Chloride 98 - 111 mmol/L 105  106  104   CO2 22 - 32 mmol/L 27  27  27    Calcium 8.9 - 10.3 mg/dL 8.8  8.9  8.8   Total Protein 6.5 - 8.1 g/dL 6.1  5.8  5.9   Total Bilirubin 0.0 - 1.2 mg/dL 0.7  0.8  0.7   Alkaline Phos 38 - 126 U/L 113  60  54   AST 15 - 41 U/L 24  19  28    ALT 0 - 44 U/L 24  23  29       Lab Results  Component Value Date   CEA1 5.4 (H) 06/03/2022   /  CEA  Date Value Ref Range Status  06/03/2022 5.4 (H) 0.0 - 4.7 ng/mL Final    Comment:    (NOTE)                             Nonsmokers          <3.9                             Smokers             <5.6 Roche [employer] Electrochemiluminescence Immunoassay (ECLIA) Values obtained with different assay methods or kits cannot be used interchangeably.  Results cannot be interpreted as absolute evidence of the presence or absence of malignant disease. Performed At: [facility] [address] [place], [place] [id number] Jolene Schimke MD [id number]    Lab Results  Component Value Date   PSA1 11.3 (H) 02/16/2022   Lab Results  Component Value Date   CAN199 55 (H) 06/03/2022   No results found for: "CAN125"  No results found for: "TOTALPROTELP", "ALBUMINELP", "A1GS", "A2GS", "BETS", "BETA2SER", "GAMS", "MSPIKE", "SPEI" No results found for: "TIBC", "FERRITIN", "IRONPCTSAT" Lab Results  Component Value Date   LDH 164 06/03/2022     STUDIES:   No results found.

## 2024-01-04 NOTE — Progress Notes (Signed)
 Hold treatment today per Dr. Ellin Saba.  We will give magnesium sulfate 2 grams IV x one dose today per Dr. Ellin Saba.  Patient is in satisfactory condition with no new complaints voiced. Vital signs are stable.  Magnesium today is 1.5.  Potassium today is 3.4.  We will give Klor Con 40 mEq PO x one dose today per standing orders by Dr. Ellin Saba.  We will proceed with infusions per provider orders.   Patient tolerated infusion well with no complaints voiced.  Patient left via wheelchair  in stable condition.  Vital signs stable at discharge.  Follow up as scheduled.

## 2024-01-04 NOTE — Patient Instructions (Signed)

## 2024-01-12 NOTE — Progress Notes (Signed)
 [facility] [address] [place] [address]    Clinic Day:  01/13/2024  Referring physician: [name], Elige Radon, MD  Patient Care Team: [name], Elige Radon, MD as PCP - General (Family Medicine) [name], MD as PCP - Cardiology (Cardiology) Bjorn Pippin, MD as Attending Physician (Urology) [name], MD as Consulting Physician (Cardiology) [name], MD (Inactive) as Consulting Physician (Gastroenterology) [name], MD as Consulting Physician (Ophthalmology) [name], MD as Consulting Physician (Orthopedic Surgery) [name], [name], LCSW as [facility] Care Management (Licensed Clinical Social Worker) [name], MD as Medical Oncologist (Medical Oncology) [name], RN as Oncology Nurse Navigator (Medical Oncology)   ASSESSMENT & PLAN:   Assessment: 1.  Right supraclavicular lymphadenopathy and multiple lung nodules: - Patient noticed right neck mass since Christmas 2022.  10 to 15 pound weight loss since January 2023.  Denies any dysphagia or odynophagia. - CT neck (05/27/2022): Numerous lymph nodes in the right neck, right supraclavicular lymph node mass measures 5.7 x 3.7 cm.  18 mm posterior lymph node in the right lower neck.  Numerous lymph nodes in the right lower and posterior neck approximately 1 cm.  Many have internal necrosis consistent with metastatic disease.  20 mm lymph node just above the right clavicle.  Left level 2 node 12 mm. - CT CAP (05/28/2022): Several bilateral lung nodules, RUL nodule measuring 1.8 x 1.3 cm.  Small clusters of nodules in the medial left upper lobe.  Irregular nodular density along the left side of the mediastinum measuring 2.1 cm.  No metastatic disease in the abdomen or pelvis.  Liver is slightly nodular contour. - Lab work shows elevated white count since 2009, predominantly lymphocytosis. - Pathology (06/09/2022): Metastatic carcinoma positive for p40, p63 and  CK5/6-patchy positivity with CK7.  Negative for TTF-1, Napsin A, PAX8, prostein, PSA, CK20 and CDX2.  - PET scan (06/11/2022): Bulky right supraclavicular nodal mass, smaller hypermetabolic right posterior triangle and jugular lymph nodes and single hypermetabolic left level 3 lymph node.  Bilateral hypermetabolic pulmonary nodules and metastatic pattern.  Minimal hypermetabolic mediastinal nodal metastasis.  Cluster of hypermetabolic right axillary lymph nodes.  No evidence of metastatic disease or primary lesion in the abdomen or pelvis.  No bone lesions. - MRI of the brain: Chronic small vessel ischemic changes with no evidence of metastatic disease. - NGS: T p53 pathogenic variant, MSI-stable, TMB-low, LOH-low, PD-L1 ([id number]): Negative, p16 and p18 negative.  HER2 by IHC 0. - PD-L1 22 C3: CPS score 25 - Cancer type ID: 96% probability squamous cell carcinoma, subtype head and neck/skin.  Cancer types ruled out with 95% confidence includes skin basal cell carcinoma. - Carboplatin, Taxol and pembrolizumab 07/23/2022 through 11/09/22 - Right axillary lymph node biopsy (08/04/2023): Metastatic moderate to poorly differentiated squamous cell carcinoma. - Cetuximab every 2 weeks started on 10/25/2023   2.  Social/family history: - He lives at home with his wife.  Independent of ADLs and IADLs.  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  May have had asbestos exposure 30 years ago.  Quit smoking cigarettes in 1965.  Smoked 1 pack/day for less than 12 years.  2 of his brothers had lung cancer and both were smokers.  3.  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    Plan: 1.  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 - CT chest (12/30/2023): Right low paratracheal lymph node decreased from 2.4 cm to 1.2 cm.  Right axillary  lymph node increased by 2 mm.  Other mediastinal, hilar and supraclavicular lymph nodes are stable.  No evidence of pulmonary metastatic disease.  New superior endplate compression deformity at T12 with less than 10% loss of vertebral height. - CT soft tissue neck (12/30/2023): Lymphadenopathy in the mid to lower neck is largely stable. - Reviewed his labs today: CBC grossly normal.  LFTs are normal. - He had good response with cetuximab.  However rash has gotten worse since last visit.  Will hold his treatment for another 2 weeks and try to control his rash prior to starting him back on cetuximab.    2.  EGFR antibody induced rash: - He has grade 2 skin rash involving more than 30% of body surface area with mild itching mostly on the upper chest, trunk, extremities and lower back. - He is taking doxycycline twice daily.  He is using clindamycin gel twice daily. - Will prescribe fluocinonide 0.05% cream twice daily on the body only.  He will use clindamycin gel on the face.  We will hold his treatment.   3.  Cancer-related fatigue: - Continue Ritalin 5 mg in the mornings as needed for fatigue.  4.  Polyarthralgias: - She will continue prednisone 5 mg daily which is helping with polyarthralgias.  5.  Hypomagnesemia: - He is taking magnesium twice daily.  Magnesium today improved to 2.0.    No orders of the defined types were placed in this encounter.    Alben Deeds Teague,acting as a Neurosurgeon for [name], MD.,have documented all relevant documentation on the behalf of [name], MD,as directed by  [name], MD while in the presence of [name], MD.  I, [name] MD, have reviewed the above documentation for accuracy and completeness, and I agree with the above.    [name], MD   4/3/202510:45 AM  CHIEF COMPLAINT:   Diagnosis: metastatic squamous cell carcinoma    Cancer Staging  Squamous cell carcinoma of head and  neck Staging form: Cervical Lymph Nodes and Unknown Primary Tumors of the Head and Neck, AJCC 8th Edition - Clinical stage from 07/16/2022: Stage IVC (cT0, cN3b, cM1) - Unsigned    Prior Therapy: Carboplatin, paclitaxel and pembrolizumab, 07/23/22 - 11/09/22   Current Therapy: Cetuximab   HISTORY OF PRESENT ILLNESS:   Oncology History  Squamous cell carcinoma of head and neck  07/16/2022 Initial Diagnosis   Squamous cell carcinoma of head and neck (HCC)   07/23/2022 - 09/27/2023 Chemotherapy   Patient is on Treatment Plan : Carboplatin  + Paclitaxel  + Pembrolizumab (200) D1 q21d     10/25/2023 -  Chemotherapy   Patient is on Treatment Plan : HEAD/NECK Cetuximab q14d        INTERVAL HISTORY:   [name] is a 89 y.o. male presenting to clinic today for follow up of metastatic squamous cell carcinoma. He was last seen by me on 01/04/24.  Today, he states that he is doing well overall. His appetite level is at 75%. His energy level is at 10%.  PAST MEDICAL HISTORY:   Past Medical History: Past Medical History:  Diagnosis Date   Anemia    Atrial fibrillation (HCC)    Cataract    Cellulitis    In past   Complication of anesthesia    HARD TIME WAKING; CAUSES MY BP TO GO UP  Coronary artery disease    5 bypasses   DJD (degenerative joint disease)    Ejection fraction < 50%    Mildly reduced, 40% by echo   GERD (gastroesophageal reflux disease)    Hyperlipidemia    Hypertension    Persistent atrial fibrillation (HCC)    Prostate hypertrophy    on CT scan 09/2014   PUD (peptic ulcer disease)    RESOLVED    Skin cancer of eyelid    Resected    Surgical History: Past Surgical History:  Procedure Laterality Date   APPENDECTOMY     BYPASS GRAFT  2005   CATARACT EXTRACTION W/PHACO Left 07/22/2015   Procedure: CATARACT EXTRACTION PHACO AND INTRAOCULAR LENS PLACEMENT LEFT EYE CDE=8.14;  Surgeon: [name], MD;  Location: [facility];  Service: Ophthalmology;  Laterality: Left;    CATARACT EXTRACTION W/PHACO Right 08/18/2019   Procedure: CATARACT EXTRACTION PHACO AND INTRAOCULAR LENS PLACEMENT (IOC);  Surgeon: Fabio Pierce, MD;  Location: [facility];  Service: Ophthalmology;  Laterality: Right;  CDE: 9.74   CORONARY ARTERY BYPASS GRAFT  4/05   LIMA to LAD, SVG to PDA, SVG to ramus intermediate   EYE SURGERY  07/2015   EYELID CARCINOMA EXCISION     Skin cancer resected   Heart bypass  2005   LYMPH NODE BIOPSY Right 06/09/2022   Procedure: LYMPH NODE BIOPSY; supraclavicular;  Surgeon: Lucretia Roers, MD;  Location: [facility];  Service: General;  Laterality: Right;   PORTACATH PLACEMENT Left 06/09/2022   Procedure: INSERTION PORT-A-CATH;  Surgeon: Lucretia Roers, MD;  Location: [facility];  Service: General;  Laterality: Left;   TOTAL HIP ARTHROPLASTY     Right   TOTAL HIP ARTHROPLASTY Left 05/04/2018   Procedure: LEFT TOTAL HIP ARTHROPLASTY;  Surgeon: [name], MD;  Location: [facility];  Service: Orthopedics;  Laterality: Left;   TOTAL KNEE ARTHROPLASTY     Right    Social History: Social History   Socioeconomic History   Marital status: Married    Spouse name: Talbert Forest   Number of children: 1   Years of education: 10   Highest education level: 10th grade  Occupational History   Occupation: retired  Tobacco Use   Smoking status: Former    Current packs/day: 0.00    Average packs/day: 2.0 packs/day for 5.0 years (10.0 ttl pk-yrs)    Types: Cigarettes    Start date: 12/23/1958    Quit date: 12/23/1963    Years since quitting: 60.0   Smokeless tobacco: Never  Vaping Use   Vaping status: Never Used  Substance and Sexual Activity   Alcohol use: No   Drug use: No   Sexual activity: Yes  Other Topics Concern   Not on file  Social History Narrative   Lives in a split level home.   Social Drivers of Corporate investment banker Strain: Low Risk  (10/25/2023)   Overall Financial Resource Strain (CARDIA)    Difficulty of Paying Living Expenses: Not hard  at all  Food Insecurity: No Food Insecurity (10/25/2023)   Hunger Vital Sign    Worried About Running Out of Food in the Last Year: Never true    Ran Out of Food in the Last Year: Never true  Transportation Needs: No Transportation Needs (10/25/2023)   PRAPARE - Administrator, Civil Service (Medical): No    Lack of Transportation (Non-Medical): No  Physical Activity: Inactive (10/25/2023)   Exercise Vital Sign    Days of  Exercise per Week: 1 day    Minutes of Exercise per Session: 0 min  Stress: No Stress Concern Present (10/25/2023)   [employer] of Occupational Health - Occupational Stress Questionnaire    Feeling of Stress : Only a little  Social Connections: Moderately Isolated (10/25/2023)   Social Connection and Isolation Panel [NHANES]    Frequency of Communication with Friends and Family: More than three times a week    Frequency of Social Gatherings with Friends and Family: Twice a week    Attends Religious Services: Never    Database administrator or Organizations: No    Attends Engineer, structural: Not on file    Marital Status: Married  Catering manager Violence: Not At Risk (01/06/2023)   Humiliation, Afraid, Rape, and Kick questionnaire    Fear of Current or Ex-Partner: No    Emotionally Abused: No    Physically Abused: No    Sexually Abused: No    Family History: Family History  Problem Relation Age of Onset   Stroke Mother    Stroke Father    Hypertension Father    Early death Brother        36 months old   Cancer Brother        lung   Stroke Brother        heat    Current Medications:  Current Outpatient Medications:    acetaminophen (TYLENOL) 500 MG tablet, Take 1,000 mg by mouth every 6 (six) hours as needed for moderate pain., Disp: , Rfl:    Cholecalciferol (VITAMIN D3) 5000 units CAPS, Take 5,000 Units by mouth once a week. , Disp: , Rfl:    clindamycin (CLINDAGEL) 1 % gel, Apply topically 2 (two) times daily., Disp: 60 g,  Rfl: 3   diltiazem (CARDIZEM CD) 120 MG 24 hr capsule, TAKE ONE (1) CAPSULE EACH DAY, Disp: 90 capsule, Rfl: 3   doxycycline (VIBRA-TABS) 100 MG tablet, TAKE ONE (1) TABLET BY MOUTH TWO (2) TIMES DAILY, Disp: 60 tablet, Rfl: 2   ezetimibe (ZETIA) 10 MG tablet, TAKE ONE (1) TABLET BY MOUTH EVERY DAY, Disp: 90 tablet, Rfl: 0   finasteride (PROSCAR) 5 MG tablet, TAKE ONE (1) TABLET EACH DAY, Disp: 90 tablet, Rfl: 3   fluocinonide-emollient (LIDEX-E) 0.05 % cream, Apply 1 Application topically 2 (two) times daily. Do not apply on face, Disp: 60 g, Rfl: 3   fluticasone (FLONASE) 50 MCG/ACT nasal spray, Place 1 spray into both nostrils daily as needed for allergies or rhinitis., Disp: , Rfl:    furosemide (LASIX) 20 MG tablet, Take 1 tablet (20 mg total) by mouth daily., Disp: 90 tablet, Rfl: 3   lovastatin (MEVACOR) 40 MG tablet, Take 1 tablet (40 mg total) by mouth at bedtime., Disp: 90 tablet, Rfl: 3   magnesium oxide (MAG-OX) 400 (240 Mg) MG tablet, Take 1 tablet (400 mg total) by mouth 2 (two) times daily., Disp: 60 tablet, Rfl: 3   methylphenidate (RITALIN) 5 MG tablet, TAKE 1 TABLET BY MOUTH DAILY AS NEEDED, Disp: 30 tablet, Rfl: 0   metoprolol tartrate (LOPRESSOR) 50 MG tablet, Take 1 tablet (50 mg total) by mouth 2 (two) times daily., Disp: 180 tablet, Rfl: 3   montelukast (SINGULAIR) 10 MG tablet, Take 1 tablet (10 mg total) by mouth at bedtime., Disp: 90 tablet, Rfl: 3   predniSONE (DELTASONE) 5 MG tablet, Take 1 tablet (5 mg total) by mouth daily with breakfast., Disp: 90 tablet, Rfl: 2   warfarin (  COUMADIN) 5 MG tablet, TAKE 1 TABLET ON SATURDAY, SUNDAY, TUESDAY, & THURSDAY. TAKE 1/2 TABLET ON MONDAY, WEDNESDAY, & FRIDAY, Disp: 70 tablet, Rfl: 0 No current facility-administered medications for this visit.  Facility-Administered Medications Ordered in Other Visits:    sodium chloride flush (NS) 0.9 % injection 10 mL, 10 mL, Intravenous, PRN, [name], MD, 10 mL at 01/13/24 1043    Allergies: Allergies  Allergen Reactions   Penicillins Hives and Rash    Has patient had a PCN reaction causing immediate rash, facial/tongue/throat swelling, SOB or lightheadedness with hypotension: Yes Has patient had a PCN reaction causing severe rash involving mucus membranes or skin necrosis: Yes Has patient had a PCN reaction that required hospitalization: No Has patient had a PCN reaction occurring within the last 10 years: No If all of the above answers are "NO", then may proceed with Cephalosporin use.    Cetuximab Cough    Cough and scratchy throat. Drug rechallenged and pt able to tolerated the rest of the infusion without any complications.    Hct [Hydrochlorothiazide] Other (See Comments)    hyper   Statins Other (See Comments)    Myalgia, tolerates low dosages of lovastatin     REVIEW OF SYSTEMS:   Review of Systems  Constitutional:  Negative for chills, fatigue and fever.  HENT:   Negative for lump/mass, mouth sores, nosebleeds, sore throat and trouble swallowing.   Eyes:  Negative for eye problems.  Respiratory:  Negative for cough and shortness of breath.   Cardiovascular:  Negative for chest pain, leg swelling and palpitations.  Gastrointestinal:  Positive for constipation. Negative for abdominal pain, diarrhea, nausea and vomiting.  Genitourinary:  Negative for bladder incontinence, difficulty urinating, dysuria, frequency, hematuria and nocturia.   Musculoskeletal:  Negative for arthralgias, back pain, flank pain, myalgias and neck pain.  Skin:  Positive for itching and rash.  Neurological:  Positive for dizziness. Negative for headaches and numbness.  Hematological:  Does not bruise/bleed easily.  Psychiatric/Behavioral:  Negative for depression, sleep disturbance and suicidal ideas. The patient is not nervous/anxious.   All other systems reviewed and are negative.    VITALS:   There were no vitals taken for this visit.  Wt Readings from Last 3  Encounters:  01/04/24 176 lb 5.9 oz (80 kg)  12/21/23 180 lb (81.6 kg)  12/17/23 183 lb (83 kg)    There is no height or weight on file to calculate BMI.  Performance status (ECOG): 1 - Symptomatic but completely ambulatory  PHYSICAL EXAM:   Physical Exam Vitals and nursing note reviewed. Exam conducted with a chaperone present.  Constitutional:      Appearance: Normal appearance.  Cardiovascular:     Rate and Rhythm: Normal rate and regular rhythm.     Pulses: Normal pulses.     Heart sounds: Normal heart sounds.  Pulmonary:     Effort: Pulmonary effort is normal.     Breath sounds: Normal breath sounds.  Abdominal:     Palpations: Abdomen is soft. There is no hepatomegaly, splenomegaly or mass.     Tenderness: There is no abdominal tenderness.  Musculoskeletal:     Right lower leg: No edema.     Left lower leg: No edema.  Lymphadenopathy:     Cervical: No cervical adenopathy.     Right cervical: No superficial, deep or posterior cervical adenopathy.    Left cervical: No superficial, deep or posterior cervical adenopathy.     Upper Body:  Right upper body: No supraclavicular or axillary adenopathy.     Left upper body: No supraclavicular or axillary adenopathy.  Neurological:     General: No focal deficit present.     Mental Status: He is alert and oriented to person, place, and time.  Psychiatric:        Mood and Affect: Mood normal.        Behavior: Behavior normal.     LABS:      Latest Ref Rng & Units 01/13/2024    8:39 AM 01/04/2024    9:47 AM 12/21/2023   10:05 AM  CBC  WBC 4.0 - 10.5 K/uL 12.9  15.1  18.0   Hemoglobin 13.0 - 17.0 g/dL 16.1  09.6  04.5   Hematocrit 39.0 - 52.0 % 42.1  42.3  40.6   Platelets 150 - 400 K/uL 258  250  201       Latest Ref Rng & Units 01/13/2024    8:39 AM 01/04/2024    9:47 AM 12/21/2023   10:04 AM  CMP  Glucose 70 - 99 mg/dL 409  811  914   BUN 8 - 23 mg/dL 14  17  18    Creatinine 0.61 - 1.24 mg/dL 7.82  9.56  2.13    Sodium 135 - 145 mmol/L 140  142  143   Potassium 3.5 - 5.1 mmol/L 3.3  3.4  3.4   Chloride 98 - 111 mmol/L 104  105  106   CO2 22 - 32 mmol/L 28  27  27    Calcium 8.9 - 10.3 mg/dL 8.8  8.8  8.9   Total Protein 6.5 - 8.1 g/dL 6.2  6.1  5.8   Total Bilirubin 0.0 - 1.2 mg/dL 0.8  0.7  0.8   Alkaline Phos 38 - 126 U/L 104  113  60   AST 15 - 41 U/L 26  24  19    ALT 0 - 44 U/L 23  24  23       Lab Results  Component Value Date   CEA1 5.4 (H) 06/03/2022   /  CEA  Date Value Ref Range Status  06/03/2022 5.4 (H) 0.0 - 4.7 ng/mL Final    Comment:    (NOTE)                             Nonsmokers          <3.9                             Smokers             <5.6 Roche [employer] Electrochemiluminescence Immunoassay (ECLIA) Values obtained with different assay methods or kits cannot be used interchangeably.  Results cannot be interpreted as absolute evidence of the presence or absence of malignant disease. Performed At: [facility] [address] [place], [place] [id number] Jolene Schimke MD [id number]    Lab Results  Component Value Date   PSA1 11.3 (H) 02/16/2022   Lab Results  Component Value Date   CAN199 53 (H) 06/03/2022   No results found for: "CAN125"  No results found for: "TOTALPROTELP", "ALBUMINELP", "A1GS", "A2GS", "BETS", "BETA2SER", "GAMS", "MSPIKE", "SPEI" No results found for: "TIBC", "FERRITIN", "IRONPCTSAT" Lab Results  Component Value Date   LDH 164 06/03/2022     STUDIES:   CT CHEST W CONTRAST Result Date: 01/08/2024 CLINICAL DATA:  Squamous cell carcinoma of the head and neck with unknown primary. Right supraclavicular adenopathy and pulmonary nodules. Ongoing chemotherapy and radiation therapy. Assess treatment response. * Tracking Code: BO * EXAM: CT CHEST WITH CONTRAST TECHNIQUE: Multidetector CT imaging of the chest was performed during intravenous contrast administration. RADIATION DOSE REDUCTION: This exam was performed according to  the departmental dose-optimization program which includes automated exposure control, adjustment of the mA and/or kV according to patient size and/or use of iterative reconstruction technique. CONTRAST:  75mL OMNIPAQUE IOHEXOL 300 MG/ML  SOLN COMPARISON:  Chest CT 10/12/2023 and 07/16/2023. FINDINGS: Neck CT findings dictated separately. Cardiovascular: Again demonstrated is extensive atherosclerosis of the aorta, great vessels and coronary arteries status post median sternotomy and CABG. No acute vascular findings are demonstrated. Left IJ Port-A-Cath extends to the mid SVC level. There are calcifications of the aortic valve and stable cardiomegaly. Mediastinum/Nodes: Multiple enlarged mediastinal and hilar lymph nodes are again noted, similar to previous study. One low right paratracheal node has decreased in size in the interval, now measuring 1.2 cm short axis on image 70/503 (previously 2.4 cm). Other mediastinal lymph nodes have not significantly changed, including a precarinal node measuring 1.8 cm on image 68/503 and a 1.5 cm right infrahilar node on image 89/503. One of the enlarged right axillary lymph nodes has slightly progressed in the interval, now measuring 1.8 cm on image 66/503 (previously 1.6 cm). Bilateral supraclavicular adenopathy has not significantly changed.Stable small hiatal hernia. Lungs/Pleura: No pleural effusion or pneumothorax. Stable chronic central airway thickening with subpleural reticulation and scattered parenchymal scarring. Subpleural nodular density peripherally in the right upper lobe measuring 1.1 x 0.7 cm on image 61/504 has not significantly changed. No new, enlarging or otherwise suspicious pulmonary nodules demonstrated. Upper abdomen: No acute findings are seen in the visualized upper abdomen. Calcified gallstones, morphologic changes of hepatic cirrhosis, aortic and branch vessel atherosclerosis and a left renal cyst (for which no specific follow-up imaging is  recommended) are not significantly changed. Musculoskeletal/Chest wall: There is a new superior endplate compression deformity at T12 with less than 10% loss of vertebral body height and no significant osseous retropulsion. This demonstrates no pathologic features. No evidence of osseous metastatic disease. Mild multilevel spondylosis. Healed median sternotomy. Mild bilateral gynecomastia. IMPRESSION: 1. Mixed response to therapy. One low right paratracheal lymph node has decreased in size in the interval, while one of the enlarged right axillary lymph nodes has slightly progressed. Other mediastinal, hilar and supraclavicular lymph nodes are stable. 2. No evidence of pulmonary metastatic disease. 3. New superior endplate compression deformity at T12 with less than 10% loss of vertebral body height and no significant osseous retropulsion. This demonstrates no pathologic features. 4. Stable chronic pulmonary fibrosis. 5. Stable morphologic changes of hepatic cirrhosis and cholelithiasis. 6.  Aortic Atherosclerosis (ICD10-I70.0). Electronically Signed   By: Carey Bullocks M.D.   On: 01/08/2024 09:04   CT SOFT TISSUE NECK W CONTRAST Result Date: 01/04/2024 CLINICAL DATA:  Metastatic disease evaluation. Right supraclavicular lymphadenopathy EXAM: CT NECK WITH CONTRAST TECHNIQUE: Multidetector CT imaging of the neck was performed using the standard protocol following the bolus administration of intravenous contrast. RADIATION DOSE REDUCTION: This exam was performed according to the departmental dose-optimization program which includes automated exposure control, adjustment of the mA and/or kV according to patient size and/or use of iterative reconstruction technique. CONTRAST:  75mL OMNIPAQUE IOHEXOL 300 MG/ML  SOLN COMPARISON:  10/12/2023.  04/21/2023.  02/19/2023. FINDINGS: Pharynx and larynx: No visible mucosal or submucosal mass lesion  presently. Salivary glands: Atrophic changes of the submandibular glands. No  parotid lesion. Thyroid: No significant thyroid finding. Lymph nodes: On the left, level 2 level 3 junction node measures 14 x 18 mm, axial image 67, unchanged. Two adjacent level 3 level 4 nodes axial images 78 and 75 are smaller, short axis dimension 7 and 5 mm today compared with 11 and 12 mm previously. Small supraclavicular lymph nodes axial images 85 and 86 show short axis dimension of 7 mm and 5 mm, unchanged. On the right, level 3 node axial image 68 measures 8 x 7 mm, previously 9 x 9 mm. Level 4 node axial image 79 measures 17 x 19 mm, previously 16 x 19 mm. Multiple lateral supraclavicular nodes are stable, but there has been enlargement of the dominant node axial image 76 measuring 15 x 19 mm compared with 14 x 17 mm previously. See results of chest CT for descriptions of mediastinal lymph nodes. Vascular: No acute vascular finding. Limited intracranial: Negative Visualized orbits: Normal Mastoids and visualized paranasal sinuses: Clear Skeleton: Ordinary cervical spondylosis. Upper chest: See results of chest CT. Other: None IMPRESSION: Again there is no evidence of primary mucosal or submucosal mass. Lymphadenopathy in the mid to lower neck is largely stable. Several of the nodes are a few mm smaller. A few of the nodes are a few mm larger, as described above. There has been no dramatic enlargement and there is no evidence of newly seen abnormal node. Electronically Signed   By: Paulina Fusi M.D.   On: 01/04/2024 20:01

## 2024-01-13 ENCOUNTER — Inpatient Hospital Stay: Admitting: Hematology

## 2024-01-13 ENCOUNTER — Inpatient Hospital Stay: Attending: Hematology

## 2024-01-13 ENCOUNTER — Inpatient Hospital Stay

## 2024-01-13 VITALS — BP 126/80 | HR 86 | Temp 97.3°F | Resp 20

## 2024-01-13 DIAGNOSIS — Z9221 Personal history of antineoplastic chemotherapy: Secondary | ICD-10-CM | POA: Insufficient documentation

## 2024-01-13 DIAGNOSIS — D72820 Lymphocytosis (symptomatic): Secondary | ICD-10-CM | POA: Diagnosis not present

## 2024-01-13 DIAGNOSIS — C4442 Squamous cell carcinoma of skin of scalp and neck: Secondary | ICD-10-CM

## 2024-01-13 DIAGNOSIS — C778 Secondary and unspecified malignant neoplasm of lymph nodes of multiple regions: Secondary | ICD-10-CM | POA: Insufficient documentation

## 2024-01-13 DIAGNOSIS — Z801 Family history of malignant neoplasm of trachea, bronchus and lung: Secondary | ICD-10-CM | POA: Insufficient documentation

## 2024-01-13 DIAGNOSIS — Z87891 Personal history of nicotine dependence: Secondary | ICD-10-CM | POA: Diagnosis not present

## 2024-01-13 DIAGNOSIS — C760 Malignant neoplasm of head, face and neck: Secondary | ICD-10-CM | POA: Insufficient documentation

## 2024-01-13 DIAGNOSIS — R918 Other nonspecific abnormal finding of lung field: Secondary | ICD-10-CM | POA: Insufficient documentation

## 2024-01-13 DIAGNOSIS — R530 Neoplastic (malignant) related fatigue: Secondary | ICD-10-CM | POA: Insufficient documentation

## 2024-01-13 DIAGNOSIS — Z95828 Presence of other vascular implants and grafts: Secondary | ICD-10-CM

## 2024-01-13 DIAGNOSIS — Z7952 Long term (current) use of systemic steroids: Secondary | ICD-10-CM | POA: Insufficient documentation

## 2024-01-13 DIAGNOSIS — R21 Rash and other nonspecific skin eruption: Secondary | ICD-10-CM | POA: Insufficient documentation

## 2024-01-13 LAB — CBC WITH DIFFERENTIAL/PLATELET
Abs Immature Granulocytes: 0.08 10*3/uL — ABNORMAL HIGH (ref 0.00–0.07)
Basophils Absolute: 0.1 10*3/uL (ref 0.0–0.1)
Basophils Relative: 0 %
Eosinophils Absolute: 0.4 10*3/uL (ref 0.0–0.5)
Eosinophils Relative: 3 %
HCT: 42.1 % (ref 39.0–52.0)
Hemoglobin: 13.3 g/dL (ref 13.0–17.0)
Immature Granulocytes: 1 %
Lymphocytes Relative: 33 %
Lymphs Abs: 4.3 10*3/uL — ABNORMAL HIGH (ref 0.7–4.0)
MCH: 33.1 pg (ref 26.0–34.0)
MCHC: 31.6 g/dL (ref 30.0–36.0)
MCV: 104.7 fL — ABNORMAL HIGH (ref 80.0–100.0)
Monocytes Absolute: 0.9 10*3/uL (ref 0.1–1.0)
Monocytes Relative: 7 %
Neutro Abs: 7.2 10*3/uL (ref 1.7–7.7)
Neutrophils Relative %: 56 %
Platelets: 258 10*3/uL (ref 150–400)
RBC: 4.02 MIL/uL — ABNORMAL LOW (ref 4.22–5.81)
RDW: 13.7 % (ref 11.5–15.5)
WBC: 12.9 10*3/uL — ABNORMAL HIGH (ref 4.0–10.5)
nRBC: 0 % (ref 0.0–0.2)

## 2024-01-13 LAB — COMPREHENSIVE METABOLIC PANEL WITH GFR
ALT: 23 U/L (ref 0–44)
AST: 26 U/L (ref 15–41)
Albumin: 3.2 g/dL — ABNORMAL LOW (ref 3.5–5.0)
Alkaline Phosphatase: 104 U/L (ref 38–126)
Anion gap: 8 (ref 5–15)
BUN: 14 mg/dL (ref 8–23)
CO2: 28 mmol/L (ref 22–32)
Calcium: 8.8 mg/dL — ABNORMAL LOW (ref 8.9–10.3)
Chloride: 104 mmol/L (ref 98–111)
Creatinine, Ser: 1.02 mg/dL (ref 0.61–1.24)
GFR, Estimated: >60 mL/min (ref >60)
Glucose, Bld: 154 mg/dL — ABNORMAL HIGH (ref 70–99)
Potassium: 3.3 mmol/L — ABNORMAL LOW (ref 3.5–5.1)
Sodium: 140 mmol/L (ref 135–145)
Total Bilirubin: 0.8 mg/dL (ref 0.0–1.2)
Total Protein: 6.2 g/dL — ABNORMAL LOW (ref 6.5–8.1)

## 2024-01-13 LAB — MAGNESIUM: Magnesium: 2 mg/dL (ref 1.7–2.4)

## 2024-01-13 MED ORDER — FLUOCINONIDE EMULSIFIED BASE 0.05 % EX CREA: 1.0000 | TOPICAL_CREAM | Freq: Two times a day (BID) | CUTANEOUS | 3 refills | Status: DC

## 2024-01-13 MED ORDER — SODIUM CHLORIDE 0.9% FLUSH
10.0000 mL | INTRAVENOUS | Status: DC | PRN
  Administered 2024-01-13: 10 mL via INTRAVENOUS

## 2024-01-13 MED ORDER — HEPARIN SOD (PORK) LOCK FLUSH 100 UNIT/ML IV SOLN
500.0000 [IU] | Freq: Once | INTRAVENOUS | Status: AC
  Administered 2024-01-13: 500 [IU] via INTRAVENOUS

## 2024-01-13 MED ORDER — SODIUM CHLORIDE 0.9% FLUSH
10.0000 mL | Freq: Once | INTRAVENOUS | Status: AC
  Administered 2024-01-13: 10 mL via INTRAVENOUS

## 2024-01-13 NOTE — Progress Notes (Signed)
 Message received from A.Dareen Piano RN / Dr. Ellin Saba to hold treatment and de-access patient.  Discharged from clinic ambulatory in stable condition. Alert and oriented x 3. F/U with [facility] as scheduled.

## 2024-01-13 NOTE — Patient Instructions (Addendum)
 [facility] at [facility] Discharge Instructions   You were seen and examined today by Dr. Ellin Saba.  He reviewed the results of your lab work which are normal/stable.   He reviewed the results of your CT scans which are stable.   Be sure to keep taking the doxycycline. Use the clindamycin gel twice a day. Dr. Kirtland Bouchard sent in another cream to use as well. This is to be applied on the body only.   We will hold your treatment today due to rash.   Return as scheduled.    Thank you for choosing [facility] at [facility] to provide your oncology and hematology care.  To afford each patient quality time with our provider, please arrive at least 15 minutes before your scheduled appointment time.   If you have a lab appointment with the Cancer Center please come in thru the Main Entrance and check in at the main information desk.  You need to re-schedule your appointment should you arrive 10 or more minutes late.  We strive to give you quality time with our providers, and arriving late affects you and other patients whose appointments are after yours.  Also, if you no show three or more times for appointments you may be dismissed from the clinic at the providers discretion.     Again, thank you for choosing [facility].  Our hope is that these requests will decrease the amount of time that you wait before being seen by our physicians.       _____________________________________________________________  Should you have questions after your visit to [facility], please contact our office at [phone] and follow the prompts.  Our office hours are 8:00 a.m. and 4:30 p.m. Monday - Friday.  Please note that voicemails left after 4:00 p.m. may not be returned until the following business day.  We are closed weekends and major holidays.  You do have access to a nurse 24-7, just call the main number to the clinic [phone] and do not  press any options, hold on the line and a nurse will answer the phone.    For prescription refill requests, have your pharmacy contact our office and allow 72 hours.    Due to Covid, you will need to wear a mask upon entering the hospital. If you do not have a mask, a mask will be given to you at the Main Entrance upon arrival. For doctor visits, patients may have 1 support person age 53 or older with them. For treatment visits, patients can not have anyone with them due to social distancing guidelines and our immunocompromised population.

## 2024-01-13 NOTE — Progress Notes (Signed)
Treatment held today per MD.  

## 2024-01-24 NOTE — Progress Notes (Signed)
 [facility] [address] [place] [address]    Clinic Day:  01/25/2024  Referring physician: [name], Elige Radon, MD  Patient Care Team: [name], Elige Radon, MD as PCP - General (Family Medicine) [name], MD as PCP - Cardiology (Cardiology) Bjorn Pippin, MD as Attending Physician (Urology) [name], MD as Consulting Physician (Cardiology) [name], MD (Inactive) as Consulting Physician (Gastroenterology) [name], MD as Consulting Physician (Ophthalmology) [name], MD as Consulting Physician (Orthopedic Surgery) [name], [name], LCSW as [facility] Care Management (Licensed Clinical Social Worker) [name], MD as Medical Oncologist (Medical Oncology) [name], RN as Oncology Nurse Navigator (Medical Oncology)   ASSESSMENT & PLAN:   Assessment: 1.  Right supraclavicular lymphadenopathy and multiple lung nodules: - Patient noticed right neck mass since Christmas 2022.  10 to 15 pound weight loss since January 2023.  Denies any dysphagia or odynophagia. - CT neck (05/27/2022): Numerous lymph nodes in the right neck, right supraclavicular lymph node mass measures 5.7 x 3.7 cm.  18 mm posterior lymph node in the right lower neck.  Numerous lymph nodes in the right lower and posterior neck approximately 1 cm.  Many have internal necrosis consistent with metastatic disease.  20 mm lymph node just above the right clavicle.  Left level 2 node 12 mm. - CT CAP (05/28/2022): Several bilateral lung nodules, RUL nodule measuring 1.8 x 1.3 cm.  Small clusters of nodules in the medial left upper lobe.  Irregular nodular density along the left side of the mediastinum measuring 2.1 cm.  No metastatic disease in the abdomen or pelvis.  Liver is slightly nodular contour. - Lab work shows elevated white count since 2009, predominantly lymphocytosis. - Pathology (06/09/2022): Metastatic carcinoma positive for p40, p63  and CK5/6-patchy positivity with CK7.  Negative for TTF-1, Napsin A, PAX8, prostein, PSA, CK20 and CDX2.  - PET scan (06/11/2022): Bulky right supraclavicular nodal mass, smaller hypermetabolic right posterior triangle and jugular lymph nodes and single hypermetabolic left level 3 lymph node.  Bilateral hypermetabolic pulmonary nodules and metastatic pattern.  Minimal hypermetabolic mediastinal nodal metastasis.  Cluster of hypermetabolic right axillary lymph nodes.  No evidence of metastatic disease or primary lesion in the abdomen or pelvis.  No bone lesions. - MRI of the brain: Chronic small vessel ischemic changes with no evidence of metastatic disease. - NGS: T p53 pathogenic variant, MSI-stable, TMB-low, LOH-low, PD-L1 (XB147): Negative, p16 and p18 negative.  HER2 by IHC 0. - PD-L1 22 C3: CPS score 25 - Cancer type ID: 96% probability squamous cell carcinoma, subtype head and neck/skin.  Cancer types ruled out with 95% confidence includes skin basal cell carcinoma. - Carboplatin, Taxol and pembrolizumab 07/23/2022 through 11/09/22 - Right axillary lymph node biopsy (08/04/2023): Metastatic moderate to poorly differentiated squamous cell carcinoma. - Cetuximab every 2 weeks started on 10/25/2023   2.  Social/family history: - He lives at home with his wife.  Independent of ADLs and IADLs.  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  May have had asbestos exposure 30 years ago.  Quit smoking cigarettes in 1965.  Smoked 1 pack/day for less than 12 years.  2 of his brothers had lung cancer and both were smokers.  3.  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    Plan: 1.  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 - CT chest (12/30/2023): Right low paratracheal lymph node decreased from 2.4 cm to 1.2 cm.  Right axillary  lymph node increased by 2 mm.  Other mediastinal, hilar and supraclavicular lymph nodes are stable.  No evidence of pulmonary metastatic disease.  New superior endplate compression deformity at T12 with less than 10% loss of vertebral height. - CT soft tissue neck (12/30/2023): Lymphadenopathy in the mid to lower neck is largely stable. - He reported burning sensation and dryness of his eyes for the last couple of weeks.  He also developed photophobia which is new.  He is using Systane eyedrops. - I would hold cetuximab today.  I have reached out to his ophthalmologist Dr. Lincoln Renshaw who has kindly agreed to see him this afternoon for further evaluation.  RTC 2 weeks for follow-up.   2.  EGFR antibody induced rash: - He has grade 2 skin rash involving more than 30% of the body surface area with mild itching mostly on the upper chest, trunk and extremities and lower back. - Continue doxycycline twice daily.  Continue clindamycin gel for the face twice daily.  Continue fluocinonide 0.05% cream twice daily on the body only which is helping.   3.  Cancer-related fatigue: - Continue Ritalin 5 mg in the mornings as needed for fatigue.  4.  Polyarthralgias: - Continue prednisone 5 mg daily which is helping with polyarthralgias.  5.  Hypomagnesemia: - Continue magnesium twice daily.  Magnesium is normal today.    No orders of the defined types were placed in this encounter.    [name],acting as a Neurosurgeon for Greg Boros, MD.,have documented all relevant documentation on the behalf of Greg Boros, MD,as directed by  Greg Boros, MD while in the presence of Greg Boros, MD.  I, Greg Boros MD, have reviewed the above documentation for accuracy and completeness, and I agree with the above.     Greg Boros, MD   4/15/202512:41 PM  CHIEF COMPLAINT:   Diagnosis: metastatic squamous cell carcinoma    Cancer Staging  Squamous cell carcinoma  of head and neck Staging form: Cervical Lymph Nodes and Unknown Primary Tumors of the Head and Neck, AJCC 8th Edition - Clinical stage from 07/16/2022: Stage IVC (cT0, cN3b, cM1) - Unsigned    Prior Therapy: Carboplatin, paclitaxel and pembrolizumab, 07/23/22 - 11/09/22   Current Therapy: Cetuximab   HISTORY OF PRESENT ILLNESS:   Oncology History  Squamous cell carcinoma of head and neck  07/16/2022 Initial Diagnosis   Squamous cell carcinoma of head and neck (HCC)   07/23/2022 - 09/27/2023 Chemotherapy   Patient is on Treatment Plan : Carboplatin  + Paclitaxel  + Pembrolizumab (200) D1 q21d     10/25/2023 -  Chemotherapy   Patient is on Treatment Plan : HEAD/NECK Cetuximab q14d        INTERVAL HISTORY:   [name] is a 89 y.o. male presenting to clinic today for follow up of metastatic squamous cell carcinoma. He was last seen by me on 01/13/24.  Today, he states that he is doing well overall. His appetite level is at 70%. His energy level is at 0%.  He is accompanied by his wife.  Marton reports a burning sensation in the eyes as well as itchy eyes and decreased visual acuity. He states it feels as if there are bumps in his eyes that cause irritation and . Mozell also notes difficulty looking at light and bright objects. He has been wearing sunglasses  to prevent eye strain from looking at lights. He has been using Systane eye drops BID for multiple years due to dry eyes, prescribed by his optometrist, Dr. Delora Fuel, in [place], [place].   Kizer's rash waxes and wanes, with the rash receding and reappearing across the face, scalp, upper back, and eyes, though his wife has notice improvement of the rash across the chest. His fingernails continue to be sore and the skin on his fingertips continue to be cracked. Skin cracking along the heels has resolved. He has been using fluocinonide 0.05% cream and clindamycin gel. He also notes numbness sand burning sensation in the feet.  Chibueze reports  worsening weakness that makes it difficult to ambulate.   PAST MEDICAL HISTORY:   Past Medical History: Past Medical History:  Diagnosis Date   Anemia    Atrial fibrillation (HCC)    Cataract    Cellulitis    In past   Complication of anesthesia    HARD TIME WAKING; CAUSES MY BP TO GO UP    Coronary artery disease    5 bypasses   DJD (degenerative joint disease)    Ejection fraction < 50%    Mildly reduced, 40% by echo   GERD (gastroesophageal reflux disease)    Hyperlipidemia    Hypertension    Persistent atrial fibrillation (HCC)    Prostate hypertrophy    on CT scan 09/2014   PUD (peptic ulcer disease)    RESOLVED    Skin cancer of eyelid    Resected    Surgical History: Past Surgical History:  Procedure Laterality Date   APPENDECTOMY     BYPASS GRAFT  2005   CATARACT EXTRACTION W/PHACO Left 07/22/2015   Procedure: CATARACT EXTRACTION PHACO AND INTRAOCULAR LENS PLACEMENT LEFT EYE CDE=8.14;  Surgeon: [name], MD;  Location: [facility];  Service: Ophthalmology;  Laterality: Left;   CATARACT EXTRACTION W/PHACO Right 08/18/2019   Procedure: CATARACT EXTRACTION PHACO AND INTRAOCULAR LENS PLACEMENT (IOC);  Surgeon: Fabio Pierce, MD;  Location: [facility];  Service: Ophthalmology;  Laterality: Right;  CDE: 9.74   CORONARY ARTERY BYPASS GRAFT  4/05   LIMA to LAD, SVG to PDA, SVG to ramus intermediate   EYE SURGERY  07/2015   EYELID CARCINOMA EXCISION     Skin cancer resected   Heart bypass  2005   LYMPH NODE BIOPSY Right 06/09/2022   Procedure: LYMPH NODE BIOPSY; supraclavicular;  Surgeon: Lucretia Roers, MD;  Location: [facility];  Service: General;  Laterality: Right;   PORTACATH PLACEMENT Left 06/09/2022   Procedure: INSERTION PORT-A-CATH;  Surgeon: Lucretia Roers, MD;  Location: [facility];  Service: General;  Laterality: Left;   TOTAL HIP ARTHROPLASTY     Right   TOTAL HIP ARTHROPLASTY Left 05/04/2018   Procedure: LEFT TOTAL HIP ARTHROPLASTY;  Surgeon: [name],  MD;  Location: [facility];  Service: Orthopedics;  Laterality: Left;   TOTAL KNEE ARTHROPLASTY     Right    Social History: Social History   Socioeconomic History   Marital status: Married    Spouse name: Talbert Forest   Number of children: 1   Years of education: 10   Highest education level: 10th grade  Occupational History   Occupation: retired  Tobacco Use   Smoking status: Former    Current packs/day: 0.00    Average packs/day: 2.0 packs/day for 5.0 years (10.0 ttl pk-yrs)    Types: Cigarettes    Start date: 12/23/1958    Quit date: 12/23/1963  Years since quitting: 60.1   Smokeless tobacco: Never  Vaping Use   Vaping status: Never Used  Substance and Sexual Activity   Alcohol use: No   Drug use: No   Sexual activity: Yes  Other Topics Concern   Not on file  Social History Narrative   Lives in a split level home.   Social Drivers of Corporate investment banker Strain: Low Risk  (10/25/2023)   Overall Financial Resource Strain (CARDIA)    Difficulty of Paying Living Expenses: Not hard at all  Food Insecurity: No Food Insecurity (10/25/2023)   Hunger Vital Sign    Worried About Running Out of Food in the Last Year: Never true    Ran Out of Food in the Last Year: Never true  Transportation Needs: No Transportation Needs (10/25/2023)   PRAPARE - Administrator, Civil Service (Medical): No    Lack of Transportation (Non-Medical): No  Physical Activity: Inactive (10/25/2023)   Exercise Vital Sign    Days of Exercise per Week: 1 day    Minutes of Exercise per Session: 0 min  Stress: No Stress Concern Present (10/25/2023)   [employer] of Occupational Health - Occupational Stress Questionnaire    Feeling of Stress : Only a little  Social Connections: Moderately Isolated (10/25/2023)   Social Connection and Isolation Panel [NHANES]    Frequency of Communication with Friends and Family: More than three times a week    Frequency of Social Gatherings with Friends  and Family: Twice a week    Attends Religious Services: Never    Database administrator or Organizations: No    Attends Engineer, structural: Not on file    Marital Status: Married  Catering manager Violence: Not At Risk (01/06/2023)   Humiliation, Afraid, Rape, and Kick questionnaire    Fear of Current or Ex-Partner: No    Emotionally Abused: No    Physically Abused: No    Sexually Abused: No    Family History: Family History  Problem Relation Age of Onset   Stroke Mother    Stroke Father    Hypertension Father    Early death Brother        39 months old   Cancer Brother        lung   Stroke Brother        heat    Current Medications:  Current Outpatient Medications:    acetaminophen (TYLENOL) 500 MG tablet, Take 1,000 mg by mouth every 6 (six) hours as needed for moderate pain., Disp: , Rfl:    Cholecalciferol (VITAMIN D3) 5000 units CAPS, Take 5,000 Units by mouth once a week. , Disp: , Rfl:    clindamycin (CLINDAGEL) 1 % gel, Apply topically 2 (two) times daily., Disp: 60 g, Rfl: 3   diltiazem (CARDIZEM CD) 120 MG 24 hr capsule, TAKE ONE (1) CAPSULE EACH DAY, Disp: 90 capsule, Rfl: 3   doxycycline (VIBRA-TABS) 100 MG tablet, TAKE ONE (1) TABLET BY MOUTH TWO (2) TIMES DAILY, Disp: 60 tablet, Rfl: 2   ezetimibe (ZETIA) 10 MG tablet, TAKE ONE (1) TABLET BY MOUTH EVERY DAY, Disp: 90 tablet, Rfl: 0   finasteride (PROSCAR) 5 MG tablet, TAKE ONE (1) TABLET EACH DAY, Disp: 90 tablet, Rfl: 3   fluocinonide-emollient (LIDEX-E) 0.05 % cream, Apply 1 Application topically 2 (two) times daily. Do not apply on face, Disp: 60 g, Rfl: 3   fluticasone (FLONASE) 50 MCG/ACT nasal spray, Place 1 spray into  both nostrils daily as needed for allergies or rhinitis., Disp: , Rfl:    furosemide (LASIX) 20 MG tablet, Take 1 tablet (20 mg total) by mouth daily., Disp: 90 tablet, Rfl: 3   lovastatin (MEVACOR) 40 MG tablet, Take 1 tablet (40 mg total) by mouth at bedtime., Disp: 90 tablet,  Rfl: 3   magnesium oxide (MAG-OX) 400 (240 Mg) MG tablet, Take 1 tablet (400 mg total) by mouth 2 (two) times daily., Disp: 60 tablet, Rfl: 3   methylphenidate (RITALIN) 5 MG tablet, TAKE 1 TABLET BY MOUTH DAILY AS NEEDED, Disp: 30 tablet, Rfl: 0   metoprolol tartrate (LOPRESSOR) 50 MG tablet, Take 1 tablet (50 mg total) by mouth 2 (two) times daily., Disp: 180 tablet, Rfl: 3   montelukast (SINGULAIR) 10 MG tablet, Take 1 tablet (10 mg total) by mouth at bedtime., Disp: 90 tablet, Rfl: 3   predniSONE (DELTASONE) 5 MG tablet, Take 1 tablet (5 mg total) by mouth daily with breakfast., Disp: 90 tablet, Rfl: 2   warfarin (COUMADIN) 5 MG tablet, TAKE 1 TABLET ON SATURDAY, SUNDAY, TUESDAY, & THURSDAY. TAKE 1/2 TABLET ON MONDAY, WEDNESDAY, & FRIDAY, Disp: 70 tablet, Rfl: 0   Allergies: Allergies  Allergen Reactions   Penicillins Hives and Rash    Has patient had a PCN reaction causing immediate rash, facial/tongue/throat swelling, SOB or lightheadedness with hypotension: Yes Has patient had a PCN reaction causing severe rash involving mucus membranes or skin necrosis: Yes Has patient had a PCN reaction that required hospitalization: No Has patient had a PCN reaction occurring within the last 10 years: No If all of the above answers are "NO", then may proceed with Cephalosporin use.    Cetuximab Cough    Cough and scratchy throat. Drug rechallenged and pt able to tolerated the rest of the infusion without any complications.    Hct [Hydrochlorothiazide] Other (See Comments)    hyper   Statins Other (See Comments)    Myalgia, tolerates low dosages of lovastatin     REVIEW OF SYSTEMS:   Review of Systems  Constitutional:  Negative for chills, fatigue and fever.  HENT:   Negative for lump/mass, mouth sores, nosebleeds, sore throat and trouble swallowing.        +burning sensation in the eyes +itchy eyes  Eyes:  Negative for eye problems.  Respiratory:  Negative for cough and shortness of  breath.   Cardiovascular:  Negative for chest pain, leg swelling and palpitations.  Gastrointestinal:  Negative for abdominal pain, constipation, diarrhea, nausea and vomiting.  Genitourinary:  Negative for bladder incontinence, difficulty urinating, dysuria, frequency, hematuria and nocturia.   Musculoskeletal:  Negative for arthralgias, back pain, flank pain, myalgias and neck pain.  Skin:  Positive for rash. Negative for itching.  Neurological:  Positive for dizziness and numbness (and burning in feet). Negative for headaches.  Hematological:  Does not bruise/bleed easily.  Psychiatric/Behavioral:  Negative for depression, sleep disturbance and suicidal ideas. The patient is not nervous/anxious.   All other systems reviewed and are negative.    VITALS:   There were no vitals taken for this visit.  Wt Readings from Last 3 Encounters:  01/25/24 174 lb 13.2 oz (79.3 kg)  01/04/24 176 lb 5.9 oz (80 kg)  12/21/23 180 lb (81.6 kg)    There is no height or weight on file to calculate BMI.  Performance status (ECOG): 1 - Symptomatic but completely ambulatory  PHYSICAL EXAM:   Physical Exam Vitals and nursing note reviewed. Exam  conducted with a chaperone present.  Constitutional:      Appearance: Normal appearance.  Eyes:     Comments: +bilateral conjunctival erythema   Cardiovascular:     Rate and Rhythm: Normal rate and regular rhythm.     Pulses: Normal pulses.     Heart sounds: Normal heart sounds.  Pulmonary:     Effort: Pulmonary effort is normal.     Breath sounds: Normal breath sounds.  Abdominal:     Palpations: Abdomen is soft. There is no hepatomegaly, splenomegaly or mass.     Tenderness: There is no abdominal tenderness.  Musculoskeletal:     Right lower leg: No edema.     Left lower leg: No edema.  Lymphadenopathy:     Cervical: No cervical adenopathy.     Right cervical: No superficial, deep or posterior cervical adenopathy.    Left cervical: No superficial,  deep or posterior cervical adenopathy.     Upper Body:     Right upper body: No supraclavicular or axillary adenopathy.     Left upper body: No supraclavicular or axillary adenopathy.  Neurological:     General: No focal deficit present.     Mental Status: He is alert and oriented to person, place, and time.  Psychiatric:        Mood and Affect: Mood normal.        Behavior: Behavior normal.     LABS:      Latest Ref Rng & Units 01/25/2024    8:29 AM 01/13/2024    8:39 AM 01/04/2024    9:47 AM  CBC  WBC 4.0 - 10.5 K/uL 12.7  12.9  15.1   Hemoglobin 13.0 - 17.0 g/dL 16.1  09.6  04.5   Hematocrit 39.0 - 52.0 % 41.7  42.1  42.3   Platelets 150 - 400 K/uL 214  258  250       Latest Ref Rng & Units 01/25/2024    8:29 AM 01/13/2024    8:39 AM 01/04/2024    9:47 AM  CMP  Glucose 70 - 99 mg/dL 409  811  914   BUN 8 - 23 mg/dL 14  14  17    Creatinine 0.61 - 1.24 mg/dL 7.82  9.56  2.13   Sodium 135 - 145 mmol/L 140  140  142   Potassium 3.5 - 5.1 mmol/L 3.5  3.3  3.4   Chloride 98 - 111 mmol/L 103  104  105   CO2 22 - 32 mmol/L 27  28  27    Calcium 8.9 - 10.3 mg/dL 8.9  8.8  8.8   Total Protein 6.5 - 8.1 g/dL 6.1  6.2  6.1   Total Bilirubin 0.0 - 1.2 mg/dL 0.8  0.8  0.7   Alkaline Phos 38 - 126 U/L 97  104  113   AST 15 - 41 U/L 27  26  24    ALT 0 - 44 U/L 26  23  24       Lab Results  Component Value Date   CEA1 5.4 (H) 06/03/2022   /  CEA  Date Value Ref Range Status  06/03/2022 5.4 (H) 0.0 - 4.7 ng/mL Final    Comment:    (NOTE)                             Nonsmokers          <3.9  Smokers             <5.6 Roche [employer] Electrochemiluminescence Immunoassay (ECLIA) Values obtained with different assay methods or kits cannot be used interchangeably.  Results cannot be interpreted as absolute evidence of the presence or absence of malignant disease. Performed At: [facility] [address] [place], [place] [id number] Pearlean Botts MD QM:[id number]    Lab Results  Component Value Date   PSA1 11.3 (H) 02/16/2022   Lab Results  Component Value Date   CAN199 63 (H) 06/03/2022   No results found for: "CAN125"  No results found for: "TOTALPROTELP", "ALBUMINELP", "A1GS", "A2GS", "BETS", "BETA2SER", "GAMS", "MSPIKE", "SPEI" No results found for: "TIBC", "FERRITIN", "IRONPCTSAT" Lab Results  Component Value Date   LDH 164 06/03/2022     STUDIES:   CT CHEST W CONTRAST Result Date: 01/08/2024 CLINICAL DATA:  Squamous cell carcinoma of the head and neck with unknown primary. Right supraclavicular adenopathy and pulmonary nodules. Ongoing chemotherapy and radiation therapy. Assess treatment response. * Tracking Code: BO * EXAM: CT CHEST WITH CONTRAST TECHNIQUE: Multidetector CT imaging of the chest was performed during intravenous contrast administration. RADIATION DOSE REDUCTION: This exam was performed according to the departmental dose-optimization program which includes automated exposure control, adjustment of the mA and/or kV according to patient size and/or use of iterative reconstruction technique. CONTRAST:  75mL OMNIPAQUE IOHEXOL 300 MG/ML  SOLN COMPARISON:  Chest CT 10/12/2023 and 07/16/2023. FINDINGS: Neck CT findings dictated separately. Cardiovascular: Again demonstrated is extensive atherosclerosis of the aorta, great vessels and coronary arteries status post median sternotomy and CABG. No acute vascular findings are demonstrated. Left IJ Port-A-Cath extends to the mid SVC level. There are calcifications of the aortic valve and stable cardiomegaly. Mediastinum/Nodes: Multiple enlarged mediastinal and hilar lymph nodes are again noted, similar to previous study. One low right paratracheal node has decreased in size in the interval, now measuring 1.2 cm short axis on image 70/503 (previously 2.4 cm). Other mediastinal lymph nodes have not significantly changed, including a precarinal node measuring 1.8 cm on  image 68/503 and a 1.5 cm right infrahilar node on image 89/503. One of the enlarged right axillary lymph nodes has slightly progressed in the interval, now measuring 1.8 cm on image 66/503 (previously 1.6 cm). Bilateral supraclavicular adenopathy has not significantly changed.Stable small hiatal hernia. Lungs/Pleura: No pleural effusion or pneumothorax. Stable chronic central airway thickening with subpleural reticulation and scattered parenchymal scarring. Subpleural nodular density peripherally in the right upper lobe measuring 1.1 x 0.7 cm on image 61/504 has not significantly changed. No new, enlarging or otherwise suspicious pulmonary nodules demonstrated. Upper abdomen: No acute findings are seen in the visualized upper abdomen. Calcified gallstones, morphologic changes of hepatic cirrhosis, aortic and branch vessel atherosclerosis and a left renal cyst (for which no specific follow-up imaging is recommended) are not significantly changed. Musculoskeletal/Chest wall: There is a new superior endplate compression deformity at T12 with less than 10% loss of vertebral body height and no significant osseous retropulsion. This demonstrates no pathologic features. No evidence of osseous metastatic disease. Mild multilevel spondylosis. Healed median sternotomy. Mild bilateral gynecomastia. IMPRESSION: 1. Mixed response to therapy. One low right paratracheal lymph node has decreased in size in the interval, while one of the enlarged right axillary lymph nodes has slightly progressed. Other mediastinal, hilar and supraclavicular lymph nodes are stable. 2. No evidence of pulmonary metastatic disease. 3. New superior endplate compression deformity at T12 with less than 10% loss of vertebral body height and no  significant osseous retropulsion. This demonstrates no pathologic features. 4. Stable chronic pulmonary fibrosis. 5. Stable morphologic changes of hepatic cirrhosis and cholelithiasis. 6.  Aortic Atherosclerosis  (ICD10-I70.0). Electronically Signed   By: Elmon Hagedorn M.D.   On: 01/08/2024 09:04   CT SOFT TISSUE NECK W CONTRAST Result Date: 01/04/2024 CLINICAL DATA:  Metastatic disease evaluation. Right supraclavicular lymphadenopathy EXAM: CT NECK WITH CONTRAST TECHNIQUE: Multidetector CT imaging of the neck was performed using the standard protocol following the bolus administration of intravenous contrast. RADIATION DOSE REDUCTION: This exam was performed according to the departmental dose-optimization program which includes automated exposure control, adjustment of the mA and/or kV according to patient size and/or use of iterative reconstruction technique. CONTRAST:  75mL OMNIPAQUE IOHEXOL 300 MG/ML  SOLN COMPARISON:  10/12/2023.  04/21/2023.  02/19/2023. FINDINGS: Pharynx and larynx: No visible mucosal or submucosal mass lesion presently. Salivary glands: Atrophic changes of the submandibular glands. No parotid lesion. Thyroid: No significant thyroid finding. Lymph nodes: On the left, level 2 level 3 junction node measures 14 x 18 mm, axial image 67, unchanged. Two adjacent level 3 level 4 nodes axial images 78 and 75 are smaller, short axis dimension 7 and 5 mm today compared with 11 and 12 mm previously. Small supraclavicular lymph nodes axial images 85 and 86 show short axis dimension of 7 mm and 5 mm, unchanged. On the right, level 3 node axial image 68 measures 8 x 7 mm, previously 9 x 9 mm. Level 4 node axial image 79 measures 17 x 19 mm, previously 16 x 19 mm. Multiple lateral supraclavicular nodes are stable, but there has been enlargement of the dominant node axial image 76 measuring 15 x 19 mm compared with 14 x 17 mm previously. See results of chest CT for descriptions of mediastinal lymph nodes. Vascular: No acute vascular finding. Limited intracranial: Negative Visualized orbits: Normal Mastoids and visualized paranasal sinuses: Clear Skeleton: Ordinary cervical spondylosis. Upper chest: See results  of chest CT. Other: None IMPRESSION: Again there is no evidence of primary mucosal or submucosal mass. Lymphadenopathy in the mid to lower neck is largely stable. Several of the nodes are a few mm smaller. A few of the nodes are a few mm larger, as described above. There has been no dramatic enlargement and there is no evidence of newly seen abnormal node. Electronically Signed   By: Bettylou Brunner M.D.   On: 01/04/2024 20:01

## 2024-01-25 ENCOUNTER — Inpatient Hospital Stay

## 2024-01-25 ENCOUNTER — Inpatient Hospital Stay: Admitting: Hematology

## 2024-01-25 DIAGNOSIS — D72820 Lymphocytosis (symptomatic): Secondary | ICD-10-CM | POA: Diagnosis not present

## 2024-01-25 DIAGNOSIS — C4442 Squamous cell carcinoma of skin of scalp and neck: Secondary | ICD-10-CM

## 2024-01-25 DIAGNOSIS — Z801 Family history of malignant neoplasm of trachea, bronchus and lung: Secondary | ICD-10-CM | POA: Diagnosis not present

## 2024-01-25 DIAGNOSIS — S0501XD Injury of conjunctiva and corneal abrasion without foreign body, right eye, subsequent encounter: Secondary | ICD-10-CM | POA: Diagnosis not present

## 2024-01-25 DIAGNOSIS — C778 Secondary and unspecified malignant neoplasm of lymph nodes of multiple regions: Secondary | ICD-10-CM | POA: Diagnosis not present

## 2024-01-25 DIAGNOSIS — Z87891 Personal history of nicotine dependence: Secondary | ICD-10-CM | POA: Diagnosis not present

## 2024-01-25 DIAGNOSIS — H02051 Trichiasis without entropian right upper eyelid: Secondary | ICD-10-CM | POA: Diagnosis not present

## 2024-01-25 DIAGNOSIS — R530 Neoplastic (malignant) related fatigue: Secondary | ICD-10-CM | POA: Diagnosis not present

## 2024-01-25 DIAGNOSIS — R21 Rash and other nonspecific skin eruption: Secondary | ICD-10-CM | POA: Diagnosis not present

## 2024-01-25 DIAGNOSIS — Z7952 Long term (current) use of systemic steroids: Secondary | ICD-10-CM | POA: Diagnosis not present

## 2024-01-25 DIAGNOSIS — H02054 Trichiasis without entropian left upper eyelid: Secondary | ICD-10-CM | POA: Diagnosis not present

## 2024-01-25 DIAGNOSIS — C760 Malignant neoplasm of head, face and neck: Secondary | ICD-10-CM | POA: Diagnosis not present

## 2024-01-25 DIAGNOSIS — R918 Other nonspecific abnormal finding of lung field: Secondary | ICD-10-CM | POA: Diagnosis not present

## 2024-01-25 DIAGNOSIS — Z9221 Personal history of antineoplastic chemotherapy: Secondary | ICD-10-CM | POA: Diagnosis not present

## 2024-01-25 DIAGNOSIS — Z95828 Presence of other vascular implants and grafts: Secondary | ICD-10-CM

## 2024-01-25 DIAGNOSIS — S0502XD Injury of conjunctiva and corneal abrasion without foreign body, left eye, subsequent encounter: Secondary | ICD-10-CM | POA: Diagnosis not present

## 2024-01-25 DIAGNOSIS — H02059 Trichiasis without entropian unspecified eye, unspecified eyelid: Secondary | ICD-10-CM | POA: Diagnosis not present

## 2024-01-25 LAB — CBC WITH DIFFERENTIAL/PLATELET
Abs Immature Granulocytes: 0.06 10*3/uL (ref 0.00–0.07)
Basophils Absolute: 0 10*3/uL (ref 0.0–0.1)
Basophils Relative: 0 %
Eosinophils Absolute: 0.3 10*3/uL (ref 0.0–0.5)
Eosinophils Relative: 2 %
HCT: 41.7 % (ref 39.0–52.0)
Hemoglobin: 13.5 g/dL (ref 13.0–17.0)
Immature Granulocytes: 1 %
Lymphocytes Relative: 28 %
Lymphs Abs: 3.5 10*3/uL (ref 0.7–4.0)
MCH: 33.7 pg (ref 26.0–34.0)
MCHC: 32.4 g/dL (ref 30.0–36.0)
MCV: 104 fL — ABNORMAL HIGH (ref 80.0–100.0)
Monocytes Absolute: 0.8 10*3/uL (ref 0.1–1.0)
Monocytes Relative: 6 %
Neutro Abs: 8 10*3/uL — ABNORMAL HIGH (ref 1.7–7.7)
Neutrophils Relative %: 63 %
Platelets: 214 10*3/uL (ref 150–400)
RBC: 4.01 MIL/uL — ABNORMAL LOW (ref 4.22–5.81)
RDW: 13.5 % (ref 11.5–15.5)
WBC: 12.7 10*3/uL — ABNORMAL HIGH (ref 4.0–10.5)
nRBC: 0 % (ref 0.0–0.2)

## 2024-01-25 LAB — COMPREHENSIVE METABOLIC PANEL WITH GFR
ALT: 26 U/L (ref 0–44)
AST: 27 U/L (ref 15–41)
Albumin: 3.3 g/dL — ABNORMAL LOW (ref 3.5–5.0)
Alkaline Phosphatase: 97 U/L (ref 38–126)
Anion gap: 10 (ref 5–15)
BUN: 14 mg/dL (ref 8–23)
CO2: 27 mmol/L (ref 22–32)
Calcium: 8.9 mg/dL (ref 8.9–10.3)
Chloride: 103 mmol/L (ref 98–111)
Creatinine, Ser: 1.07 mg/dL (ref 0.61–1.24)
GFR, Estimated: >60 mL/min (ref >60)
Glucose, Bld: 147 mg/dL — ABNORMAL HIGH (ref 70–99)
Potassium: 3.5 mmol/L (ref 3.5–5.1)
Sodium: 140 mmol/L (ref 135–145)
Total Bilirubin: 0.8 mg/dL (ref 0.0–1.2)
Total Protein: 6.1 g/dL — ABNORMAL LOW (ref 6.5–8.1)

## 2024-01-25 LAB — MAGNESIUM: Magnesium: 2 mg/dL (ref 1.7–2.4)

## 2024-01-25 MED ORDER — SODIUM CHLORIDE 0.9% FLUSH
10.0000 mL | INTRAVENOUS | Status: DC | PRN
  Administered 2024-01-25: 10 mL via INTRAVENOUS

## 2024-01-25 MED ORDER — HEPARIN SOD (PORK) LOCK FLUSH 100 UNIT/ML IV SOLN
500.0000 [IU] | Freq: Once | INTRAVENOUS | Status: AC
  Administered 2024-01-25: 500 [IU] via INTRAVENOUS

## 2024-01-25 NOTE — Patient Instructions (Signed)
 [facility] at [facility] Discharge Instructions   You were seen and examined today by Dr. Cheree Cords.  He reviewed the results of your lab work which are normal/stable.   We will hold your treatment today due to the rash in your eyes and all over your body.  Please see your eye doctor as soon as possible. Dr. Linnell Richardson will call Dr. Lincoln Renshaw to facilitate this visit.    Return as scheduled.    Thank you for choosing  [facility] Center at [facility] to provide your oncology and hematology care.  To afford each patient quality time with our provider, please arrive at least 15 minutes before your scheduled appointment time.   If you have a lab appointment with the Cancer Center please come in thru the Main Entrance and check in at the main information desk.  You need to re-schedule your appointment should you arrive 10 or more minutes late.  We strive to give you quality time with our providers, and arriving late affects you and other patients whose appointments are after yours.  Also, if you no show three or more times for appointments you may be dismissed from the clinic at the providers discretion.     Again, thank you for choosing [facility]  Our hope is that these requests will decrease the amount of time that you wait before being seen by our physicians.       _____________________________________________________________  Should you have questions after your visit to [facility], please contact our office at [phone] and follow the prompts.  Our office hours are 8:00 a.m. and 4:30 p.m. Monday - Friday.  Please note that voicemails left after 4:00 p.m. may not be returned until the following business day.  We are closed weekends and major holidays.  You do have access to a nurse 24-7, just call the main number to the clinic [phone] and do not press any options, hold on the line and a nurse will answer the phone.    For  prescription refill requests, have your pharmacy contact our office and allow 72 hours.    Due to Covid, you will need to wear a mask upon entering the hospital. If you do not have a mask, a mask will be given to you at the Main Entrance upon arrival. For doctor visits, patients may have 1 support person age 12 or older with them. For treatment visits, patients can not have anyone with them due to social distancing guidelines and our immunocompromised population.

## 2024-01-27 ENCOUNTER — Other Ambulatory Visit

## 2024-01-27 ENCOUNTER — Ambulatory Visit

## 2024-01-27 ENCOUNTER — Ambulatory Visit: Admitting: Hematology

## 2024-02-07 ENCOUNTER — Other Ambulatory Visit: Payer: Self-pay | Admitting: Family Medicine

## 2024-02-07 DIAGNOSIS — I4819 Other persistent atrial fibrillation: Secondary | ICD-10-CM

## 2024-02-08 DIAGNOSIS — H02102 Unspecified ectropion of right lower eyelid: Secondary | ICD-10-CM | POA: Diagnosis not present

## 2024-02-08 DIAGNOSIS — H16143 Punctate keratitis, bilateral: Secondary | ICD-10-CM | POA: Diagnosis not present

## 2024-02-08 DIAGNOSIS — H02001 Unspecified entropion of right upper eyelid: Secondary | ICD-10-CM | POA: Diagnosis not present

## 2024-02-08 DIAGNOSIS — H02105 Unspecified ectropion of left lower eyelid: Secondary | ICD-10-CM | POA: Diagnosis not present

## 2024-02-08 DIAGNOSIS — H02051 Trichiasis without entropian right upper eyelid: Secondary | ICD-10-CM | POA: Diagnosis not present

## 2024-02-08 DIAGNOSIS — H02054 Trichiasis without entropian left upper eyelid: Secondary | ICD-10-CM | POA: Diagnosis not present

## 2024-02-10 ENCOUNTER — Inpatient Hospital Stay: Attending: Hematology

## 2024-02-10 ENCOUNTER — Inpatient Hospital Stay: Admitting: Hematology

## 2024-02-10 ENCOUNTER — Inpatient Hospital Stay

## 2024-02-10 VITALS — BP 135/90 | HR 84 | Resp 18

## 2024-02-10 VITALS — BP 104/69 | HR 88 | Temp 98.0°F | Resp 19 | Wt 174.4 lb

## 2024-02-10 DIAGNOSIS — R21 Rash and other nonspecific skin eruption: Secondary | ICD-10-CM | POA: Diagnosis not present

## 2024-02-10 DIAGNOSIS — D72820 Lymphocytosis (symptomatic): Secondary | ICD-10-CM | POA: Insufficient documentation

## 2024-02-10 DIAGNOSIS — C760 Malignant neoplasm of head, face and neck: Secondary | ICD-10-CM | POA: Diagnosis not present

## 2024-02-10 DIAGNOSIS — E876 Hypokalemia: Secondary | ICD-10-CM | POA: Insufficient documentation

## 2024-02-10 DIAGNOSIS — C778 Secondary and unspecified malignant neoplasm of lymph nodes of multiple regions: Secondary | ICD-10-CM | POA: Diagnosis not present

## 2024-02-10 DIAGNOSIS — C4442 Squamous cell carcinoma of skin of scalp and neck: Secondary | ICD-10-CM | POA: Diagnosis not present

## 2024-02-10 DIAGNOSIS — R530 Neoplastic (malignant) related fatigue: Secondary | ICD-10-CM | POA: Insufficient documentation

## 2024-02-10 DIAGNOSIS — Z5111 Encounter for antineoplastic chemotherapy: Secondary | ICD-10-CM | POA: Insufficient documentation

## 2024-02-10 DIAGNOSIS — Z95828 Presence of other vascular implants and grafts: Secondary | ICD-10-CM

## 2024-02-10 DIAGNOSIS — Z801 Family history of malignant neoplasm of trachea, bronchus and lung: Secondary | ICD-10-CM | POA: Insufficient documentation

## 2024-02-10 DIAGNOSIS — Z87891 Personal history of nicotine dependence: Secondary | ICD-10-CM | POA: Insufficient documentation

## 2024-02-10 DIAGNOSIS — Z7952 Long term (current) use of systemic steroids: Secondary | ICD-10-CM | POA: Diagnosis not present

## 2024-02-10 DIAGNOSIS — R918 Other nonspecific abnormal finding of lung field: Secondary | ICD-10-CM | POA: Insufficient documentation

## 2024-02-10 LAB — CBC WITH DIFFERENTIAL/PLATELET
Abs Immature Granulocytes: 0.04 10*3/uL (ref 0.00–0.07)
Basophils Absolute: 0.1 10*3/uL (ref 0.0–0.1)
Basophils Relative: 0 %
Eosinophils Absolute: 0.5 10*3/uL (ref 0.0–0.5)
Eosinophils Relative: 4 %
HCT: 41.5 % (ref 39.0–52.0)
Hemoglobin: 13.3 g/dL (ref 13.0–17.0)
Immature Granulocytes: 0 %
Lymphocytes Relative: 36 %
Lymphs Abs: 4.7 10*3/uL — ABNORMAL HIGH (ref 0.7–4.0)
MCH: 33.6 pg (ref 26.0–34.0)
MCHC: 32 g/dL (ref 30.0–36.0)
MCV: 104.8 fL — ABNORMAL HIGH (ref 80.0–100.0)
Monocytes Absolute: 0.8 10*3/uL (ref 0.1–1.0)
Monocytes Relative: 6 %
Neutro Abs: 6.8 10*3/uL (ref 1.7–7.7)
Neutrophils Relative %: 54 %
Platelets: 249 10*3/uL (ref 150–400)
RBC: 3.96 MIL/uL — ABNORMAL LOW (ref 4.22–5.81)
RDW: 13.2 % (ref 11.5–15.5)
WBC: 12.9 10*3/uL — ABNORMAL HIGH (ref 4.0–10.5)
nRBC: 0 % (ref 0.0–0.2)

## 2024-02-10 LAB — COMPREHENSIVE METABOLIC PANEL WITH GFR
ALT: 16 U/L (ref 0–44)
AST: 21 U/L (ref 15–41)
Albumin: 3.4 g/dL — ABNORMAL LOW (ref 3.5–5.0)
Alkaline Phosphatase: 90 U/L (ref 38–126)
Anion gap: 11 (ref 5–15)
BUN: 15 mg/dL (ref 8–23)
CO2: 26 mmol/L (ref 22–32)
Calcium: 9.1 mg/dL (ref 8.9–10.3)
Chloride: 103 mmol/L (ref 98–111)
Creatinine, Ser: 1.15 mg/dL (ref 0.61–1.24)
GFR, Estimated: >60 mL/min (ref >60)
Glucose, Bld: 125 mg/dL — ABNORMAL HIGH (ref 70–99)
Potassium: 3.2 mmol/L — ABNORMAL LOW (ref 3.5–5.1)
Sodium: 140 mmol/L (ref 135–145)
Total Bilirubin: 0.7 mg/dL (ref 0.0–1.2)
Total Protein: 6.1 g/dL — ABNORMAL LOW (ref 6.5–8.1)

## 2024-02-10 LAB — MAGNESIUM: Magnesium: 1.8 mg/dL (ref 1.7–2.4)

## 2024-02-10 MED ORDER — METHYLPREDNISOLONE SODIUM SUCC 125 MG IJ SOLR
125.0000 mg | Freq: Once | INTRAMUSCULAR | Status: AC
  Administered 2024-02-10: 125 mg via INTRAVENOUS
  Filled 2024-02-10: qty 2

## 2024-02-10 MED ORDER — HEPARIN SOD (PORK) LOCK FLUSH 100 UNIT/ML IV SOLN
500.0000 [IU] | Freq: Once | INTRAVENOUS | Status: AC | PRN
Start: 2024-02-10 — End: 2024-02-10
  Administered 2024-02-10: 500 [IU]

## 2024-02-10 MED ORDER — SODIUM CHLORIDE 0.9 % IV SOLN: INTRAVENOUS | Status: DC

## 2024-02-10 MED ORDER — FAMOTIDINE IN NACL 20-0.9 MG/50ML-% IV SOLN
20.0000 mg | Freq: Once | INTRAVENOUS | Status: AC
  Administered 2024-02-10: 20 mg via INTRAVENOUS
  Filled 2024-02-10: qty 50

## 2024-02-10 MED ORDER — POTASSIUM CHLORIDE CRYS ER 20 MEQ PO TBCR
40.0000 meq | EXTENDED_RELEASE_TABLET | Freq: Once | ORAL | Status: AC
  Administered 2024-02-10: 40 meq via ORAL
  Filled 2024-02-10: qty 2

## 2024-02-10 MED ORDER — CETUXIMAB CHEMO IV INJECTION 200 MG/100ML
400.0000 mg/m2 | Freq: Once | INTRAVENOUS | Status: AC
  Administered 2024-02-10: 800 mg via INTRAVENOUS
  Filled 2024-02-10: qty 400

## 2024-02-10 MED ORDER — DIPHENHYDRAMINE HCL 50 MG/ML IJ SOLN
50.0000 mg | Freq: Once | INTRAMUSCULAR | Status: AC
Start: 2024-02-10 — End: 2024-02-10
  Administered 2024-02-10: 50 mg via INTRAVENOUS
  Filled 2024-02-10: qty 1

## 2024-02-10 MED ORDER — SODIUM CHLORIDE 0.9% FLUSH
10.0000 mL | INTRAVENOUS | Status: DC | PRN
  Administered 2024-02-10: 10 mL

## 2024-02-10 MED ORDER — SODIUM CHLORIDE 0.9% FLUSH
10.0000 mL | INTRAVENOUS | Status: DC | PRN
  Administered 2024-02-10: 10 mL via INTRAVENOUS

## 2024-02-10 NOTE — Progress Notes (Signed)
 Patient presents today for Erbitux  infusion per providers order.  Vital signs and labs reviewed by MD.  Message received from Donzell Gallery RN/Dr. Cheree Cords patient okay for treatment.  Treatment given today per MD orders.  Tolerated infusion without adverse affects.  Vital signs stable.  No complaints at this time.  Discharge from clinic ambulatory in stable condition.  Alert and oriented X 3.  Follow up with [facility] as scheduled.

## 2024-02-10 NOTE — Progress Notes (Signed)
 Patient has been examined by Dr. Ellin Saba. Vital signs and labs have been reviewed by MD - ANC, Creatinine, LFTs, hemoglobin, and platelets are within treatment parameters per M.D. - pt may proceed with treatment.  Primary RN and pharmacy notified.

## 2024-02-10 NOTE — Patient Instructions (Signed)
 [facility] [place] - A DEPT OF [facility]  Discharge Instructions: Thank you for choosing [facility] to provide your oncology and hematology care.  If you have a lab appointment with the Cancer Center - please note that after April 8th, 2024, all labs will be drawn in the cancer center.  You do not have to check in or register with the main entrance as you have in the past but will complete your check-in in the cancer center.  Wear comfortable clothing and clothing appropriate for easy access to any Portacath or PICC line.   We strive to give you quality time with your provider. You may need to reschedule your appointment if you arrive late (15 or more minutes).  Arriving late affects you and other patients whose appointments are after yours.  Also, if you miss three or more appointments without notifying the office, you may be dismissed from the clinic at the provider's discretion.      For prescription refill requests, have your pharmacy contact our office and allow 72 hours for refills to be completed.    Today you received the following chemotherapy and/or immunotherapy agents Erbitux       To help prevent nausea and vomiting after your treatment, we encourage you to take your nausea medication as directed.  BELOW ARE SYMPTOMS THAT SHOULD BE REPORTED IMMEDIATELY: *FEVER GREATER THAN 100.4 F (38 C) OR HIGHER *CHILLS OR SWEATING *NAUSEA AND VOMITING THAT IS NOT CONTROLLED WITH YOUR NAUSEA MEDICATION *UNUSUAL SHORTNESS OF BREATH *UNUSUAL BRUISING OR BLEEDING *URINARY PROBLEMS (pain or burning when urinating, or frequent urination) *BOWEL PROBLEMS (unusual diarrhea, constipation, pain near the anus) TENDERNESS IN MOUTH AND THROAT WITH OR WITHOUT PRESENCE OF ULCERS (sore throat, sores in mouth, or a toothache) UNUSUAL RASH, SWELLING OR PAIN  UNUSUAL VAGINAL DISCHARGE OR ITCHING   Items with * indicate a potential emergency and should be followed up  as soon as possible or go to the Emergency Department if any problems should occur.  Please show the CHEMOTHERAPY ALERT CARD or IMMUNOTHERAPY ALERT CARD at check-in to the Emergency Department and triage nurse.  Should you have questions after your visit or need to cancel or reschedule your appointment, please contact [facility] [place] - A DEPT OF [facility] [phone]  and follow the prompts.  Office hours are 8:00 a.m. to 4:30 p.m. Monday - Friday. Please note that voicemails left after 4:00 p.m. may not be returned until the following business day.  We are closed weekends and major holidays. You have access to a nurse at all times for urgent questions. Please call the main number to the clinic [phone] and follow the prompts.  For any non-urgent questions, you may also contact your provider using MyChart. We now offer e-Visits for anyone 76 and older to request care online for non-urgent symptoms. For details visit [email or web address].   Also download the MyChart app! Go to the app store, search "MyChart", open the app, select [place], and log in with your MyChart username and password.

## 2024-02-10 NOTE — Progress Notes (Signed)
 [facility] [address], [place] [address]    Clinic Day:  02/10/2024  Referring physician: [name], [name], MD  Patient Care Team: [name], [name], MD as PCP - General (Family Medicine) [name], MD as PCP - Cardiology (Cardiology) [name], MD as Attending Physician (Urology) [name], MD as Consulting Physician (Cardiology) [name], MD (Inactive) as Consulting Physician (Gastroenterology) [name], MD as Consulting Physician (Ophthalmology) [name], MD as Consulting Physician (Orthopedic Surgery) [name], [name], LCSW as [facility] Care Management (Licensed Clinical Social Worker) [name], MD as Medical Oncologist (Medical Oncology) Gerhard Knuckles, RN as Oncology Nurse Navigator (Medical Oncology)   ASSESSMENT & PLAN:   Assessment: 1.  Right supraclavicular lymphadenopathy and multiple lung nodules: - Patient noticed right neck mass since Christmas 2022.  10 to 15 pound weight loss since January 2023.  Denies any dysphagia or odynophagia. - CT neck (05/27/2022): Numerous lymph nodes in the right neck, right supraclavicular lymph node mass measures 5.7 x 3.7 cm.  18 mm posterior lymph node in the right lower neck.  Numerous lymph nodes in the right lower and posterior neck approximately 1 cm.  Many have internal necrosis consistent with metastatic disease.  20 mm lymph node just above the right clavicle.  Left level 2 node 12 mm. - CT CAP (05/28/2022): Several bilateral lung nodules, RUL nodule measuring 1.8 x 1.3 cm.  Small clusters of nodules in the medial left upper lobe.  Irregular nodular density along the left side of the mediastinum measuring 2.1 cm.  No metastatic disease in the abdomen or pelvis.  Liver is slightly nodular contour. - Lab work shows elevated white count since 2009, predominantly lymphocytosis. - Pathology (06/09/2022): Metastatic carcinoma positive for p40, p63 and  CK5/6-patchy positivity with CK7.  Negative for TTF-1, Napsin A, PAX8, prostein, PSA, CK20 and CDX2.  - PET scan (06/11/2022): Bulky right supraclavicular nodal mass, smaller hypermetabolic right posterior triangle and jugular lymph nodes and single hypermetabolic left level 3 lymph node.  Bilateral hypermetabolic pulmonary nodules and metastatic pattern.  Minimal hypermetabolic mediastinal nodal metastasis.  Cluster of hypermetabolic right axillary lymph nodes.  No evidence of metastatic disease or primary lesion in the abdomen or pelvis.  No bone lesions. - MRI of the brain: Chronic small vessel ischemic changes with no evidence of metastatic disease. - NGS: T p53 pathogenic variant, MSI-stable, TMB-low, LOH-low, PD-L1 ([id number]): Negative, p16 and p18 negative.  HER2 by IHC 0. - PD-L1 22 C3: CPS score 25 - Cancer type ID: 96% probability squamous cell carcinoma, subtype head and neck/skin.  Cancer types ruled out with 95% confidence includes skin basal cell carcinoma. - Carboplatin , Taxol  and pembrolizumab  07/23/2022 through 11/09/22 - Right axillary lymph node biopsy (08/04/2023): Metastatic moderate to poorly differentiated squamous cell carcinoma. - Cetuximab  every 2 weeks started on 10/25/2023   2.  Social/family history: - He lives at home with his wife.  Independent of ADLs and IADLs.  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  May have had asbestos exposure 30 years ago.  Quit smoking cigarettes in 1965.  Smoked 1 pack/day for less than 12 years.  2 of his brothers had lung cancer and both were smokers.  3.  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    Plan: 1.  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 - CT chest (12/29/2020): Right low paratracheal lymph node decreased from 2.4 to 1.2 cm.  Right axillary lymph  node increased by 2 mm.  Other mediastinal, hilar and supraclavicular lymph nodes are stable.  No evidence of pulmonary metastatic disease.  New superior endplate compression deformity at T12 with less than 10% loss of vertebral height. - CT soft tissue neck (12/29/2020): Lymphadenopathy in the mid to low neck is largely stable. - He was evaluated by Dr. Lincoln [name] of ophthalmology.  He has ingrowing eyelashes which were removed x 2, about 30 of them.  He feels much better.  Photophobia resolved.  He reports some weakness in the knees and decrease in appetite.  Denies any falls. - Labs today: Normal LFTs and creatinine.  CBC grossly normal. - We will proceed with cetuximab  today.  If tolerability becomes an issue, we may consider switching to chemotherapy.  Will reevaluate in 2 weeks.    2.  EGFR antibody induced rash: - Rash has improved.  Continue doxycycline  twice daily.  Continue clindamycin  gel for face twice daily and fluocinonide  0.05% twice daily for the rest of the body.   3.  Cancer-related fatigue: - Continue Ritalin  5 mg in the mornings as needed for fatigue.  4.  Polyarthralgias: - Continue prednisone  daily which is helping with polyarthralgias.  5.  Hypomagnesemia: - Continue magnesium  twice daily.  Magnesium  is normal.    Orders Placed This Encounter  Procedures   Comprehensive metabolic panel    Standing Status:   Future    Expected Date:   02/24/2024    Expiration Date:   02/23/2025   Magnesium     Standing Status:   Future    Expected Date:   02/24/2024    Expiration Date:   02/23/2025   Comprehensive metabolic panel    Standing Status:   Future    Expected Date:   03/09/2024    Expiration Date:   03/09/2025   Magnesium     Standing Status:   Future    Expected Date:   03/09/2024    Expiration Date:   03/09/2025     [name],acting as a scribe for [name], MD.,have documented all relevant documentation on the behalf of [name], MD,as  directed by  [name], MD while in the presence of [name], MD.  I, [name] MD, have reviewed the above documentation for accuracy and completeness, and I agree with the above.      [name], MD   5/1/202511:51 AM  CHIEF COMPLAINT:   Diagnosis: metastatic squamous cell carcinoma    Cancer Staging  Squamous cell carcinoma of head and neck Staging form: Cervical Lymph Nodes and Unknown Primary Tumors of the Head and Neck, AJCC 8th Edition - Clinical stage from 07/16/2022: Stage IVC (cT0, cN3b, cM1) - Unsigned    Prior Therapy: Carboplatin , paclitaxel  and pembrolizumab , 07/23/22 - 11/09/22   Current Therapy: Cetuximab    HISTORY OF PRESENT ILLNESS:   Oncology History  Squamous cell carcinoma of head and neck  07/16/2022 Initial Diagnosis   Squamous cell carcinoma of head and neck (HCC)   07/23/2022 - 09/27/2023 Chemotherapy   Patient is on Treatment Plan : Carboplatin   + Paclitaxel   + Pembrolizumab  (200) D1 q21d     10/25/2023 -  Chemotherapy   Patient is on Treatment Plan : HEAD/NECK Cetuximab  q14d        INTERVAL HISTORY:   [name] is a 89 y.o. male presenting to clinic today  for follow up of metastatic squamous cell carcinoma. He was last seen by me on 01/25/24.  Today, he states that he is doing well overall. His appetite level is at 0%. His energy level is at 0%.  He is accompanied by his wife.  Milo has been seen by his ophthalmologist twice, and states he had 15 eyelashes removed both times as they were growing inward. He has also been prescribed eye drops. He has a history of his eyelashes growing inward due to previous cancer in the eye.   Virden notes bilateral knee weakness that makes it difficult to walk and decreased appetite. He denies any recent falls or decreased food intake. He is taking Ritalin , prednisone , and all other medication as prescribed. His rash has improved since his last visit.  PAST MEDICAL HISTORY:    Past Medical History: Past Medical History:  Diagnosis Date   Anemia    Atrial fibrillation (HCC)    Cataract    Cellulitis    In past   Complication of anesthesia    HARD TIME WAKING; CAUSES MY BP TO GO UP    Coronary artery disease    5 bypasses   DJD (degenerative joint disease)    Ejection fraction < 50%    Mildly reduced, 40% by echo   GERD (gastroesophageal reflux disease)    Hyperlipidemia    Hypertension    Persistent atrial fibrillation (HCC)    Prostate hypertrophy    on CT scan 09/2014   PUD (peptic ulcer disease)    RESOLVED    Skin cancer of eyelid    Resected    Surgical History: Past Surgical History:  Procedure Laterality Date   APPENDECTOMY     BYPASS GRAFT  2005   CATARACT EXTRACTION W/PHACO Left 07/22/2015   Procedure: CATARACT EXTRACTION PHACO AND INTRAOCULAR LENS PLACEMENT LEFT EYE CDE=8.14;  Surgeon: [name], MD;  Location: [facility];  Service: Ophthalmology;  Laterality: Left;   CATARACT EXTRACTION W/PHACO Right 08/18/2019   Procedure: CATARACT EXTRACTION PHACO AND INTRAOCULAR LENS PLACEMENT (IOC);  Surgeon: Tarri Farm, MD;  Location: [facility];  Service: Ophthalmology;  Laterality: Right;  CDE: 9.74   CORONARY ARTERY BYPASS GRAFT  4/05   LIMA to LAD, SVG to PDA, SVG to ramus intermediate   EYE SURGERY  07/2015   EYELID CARCINOMA EXCISION     Skin cancer resected   Heart bypass  2005   LYMPH NODE BIOPSY Right 06/09/2022   Procedure: LYMPH NODE BIOPSY; supraclavicular;  Surgeon: Awilda Bogus, MD;  Location: [facility];  Service: General;  Laterality: Right;   PORTACATH PLACEMENT Left 06/09/2022   Procedure: INSERTION PORT-A-CATH;  Surgeon: Awilda Bogus, MD;  Location: [facility];  Service: General;  Laterality: Left;   TOTAL HIP ARTHROPLASTY     Right   TOTAL HIP ARTHROPLASTY Left 05/04/2018   Procedure: LEFT TOTAL HIP ARTHROPLASTY;  Surgeon: [name], MD;  Location: [facility];  Service: Orthopedics;  Laterality: Left;   TOTAL KNEE  ARTHROPLASTY     Right    Social History: Social History   Socioeconomic History   Marital status: Married    Spouse name: [name]   Number of children: 1   Years of education: 10   Highest education level: 10th grade  Occupational History   Occupation: retired  Tobacco Use   Smoking status: Former    Current packs/day: 0.00    Average packs/day: 2.0 packs/day for 5.0 years (10.0 ttl pk-yrs)    Types:  Cigarettes    Start date: 12/23/1958    Quit date: 12/23/1963    Years since quitting: 60.1   Smokeless tobacco: Never  Vaping Use   Vaping status: Never Used  Substance and Sexual Activity   Alcohol  use: No   Drug use: No   Sexual activity: Yes  Other Topics Concern   Not on file  Social History Narrative   Lives in a split level home.   Social Drivers of Corporate investment banker Strain: Low Risk  (10/25/2023)   Overall Financial Resource Strain (CARDIA)    Difficulty of Paying Living Expenses: Not hard at all  Food Insecurity: No Food Insecurity (10/25/2023)   Hunger Vital Sign    Worried About Running Out of Food in the Last Year: Never true    Ran Out of Food in the Last Year: Never true  Transportation Needs: No Transportation Needs (10/25/2023)   PRAPARE - Administrator, Civil Service (Medical): No    Lack of Transportation (Non-Medical): No  Physical Activity: Inactive (10/25/2023)   Exercise Vital Sign    Days of Exercise per Week: 1 day    Minutes of Exercise per Session: 0 min  Stress: No Stress Concern Present (10/25/2023)   [employer] of Occupational Health - Occupational Stress Questionnaire    Feeling of Stress : Only a little  Social Connections: Moderately Isolated (10/25/2023)   Social Connection and Isolation Panel [NHANES]    Frequency of Communication with Friends and Family: More than three times a week    Frequency of Social Gatherings with Friends and Family: Twice a week    Attends Religious Services: Never    Automotive engineer or Organizations: No    Attends Engineer, structural: Not on file    Marital Status: Married  Catering manager Violence: Not At Risk (01/06/2023)   Humiliation, Afraid, Rape, and Kick questionnaire    Fear of Current or Ex-Partner: No    Emotionally Abused: No    Physically Abused: No    Sexually Abused: No    Family History: Family History  Problem Relation Age of Onset   Stroke Mother    Stroke Father    Hypertension Father    Early death Brother        72 months old   Cancer Brother        lung   Stroke Brother        heat    Current Medications:  Current Outpatient Medications:    acetaminophen  (TYLENOL ) 500 MG tablet, Take 1,000 mg by mouth every 6 (six) hours as needed for moderate pain., Disp: , Rfl:    Cholecalciferol (VITAMIN D3) 5000 units CAPS, Take 5,000 Units by mouth once a week. , Disp: , Rfl:    clindamycin  (CLINDAGEL) 1 % gel, Apply topically 2 (two) times daily., Disp: 60 g, Rfl: 3   diltiazem  (CARDIZEM  CD) 120 MG 24 hr capsule, TAKE ONE (1) CAPSULE EACH DAY, Disp: 90 capsule, Rfl: 3   doxycycline  (VIBRA -TABS) 100 MG tablet, TAKE ONE (1) TABLET BY MOUTH TWO (2) TIMES DAILY, Disp: 60 tablet, Rfl: 2   ezetimibe  (ZETIA ) 10 MG tablet, TAKE ONE (1) TABLET BY MOUTH EVERY DAY, Disp: 90 tablet, Rfl: 0   finasteride  (PROSCAR ) 5 MG tablet, TAKE ONE (1) TABLET EACH DAY, Disp: 90 tablet, Rfl: 3   fluocinonide  cream (LIDEX ) 0.05 %, Apply 1 Application topically 2 (two) times daily., Disp: , Rfl:  fluocinonide -emollient (LIDEX -E) 0.05 % cream, Apply 1 Application topically 2 (two) times daily. Do not apply on face, Disp: 60 g, Rfl: 3   fluticasone  (FLONASE ) 50 MCG/ACT nasal spray, Place 1 spray into both nostrils daily as needed for allergies or rhinitis., Disp: , Rfl:    furosemide  (LASIX ) 20 MG tablet, Take 1 tablet (20 mg total) by mouth daily., Disp: 90 tablet, Rfl: 3   lovastatin  (MEVACOR ) 40 MG tablet, Take 1 tablet (40 mg total) by mouth at  bedtime., Disp: 90 tablet, Rfl: 3   magnesium  oxide (MAG-OX) 400 (240 Mg) MG tablet, Take 1 tablet (400 mg total) by mouth 2 (two) times daily., Disp: 60 tablet, Rfl: 3   methylphenidate  (RITALIN ) 5 MG tablet, TAKE 1 TABLET BY MOUTH DAILY AS NEEDED, Disp: 30 tablet, Rfl: 0   metoprolol  tartrate (LOPRESSOR ) 50 MG tablet, Take 1 tablet (50 mg total) by mouth 2 (two) times daily., Disp: 180 tablet, Rfl: 3   montelukast  (SINGULAIR ) 10 MG tablet, Take 1 tablet (10 mg total) by mouth at bedtime., Disp: 90 tablet, Rfl: 3   predniSONE  (DELTASONE ) 5 MG tablet, Take 1 tablet (5 mg total) by mouth daily with breakfast., Disp: 90 tablet, Rfl: 2   tobramycin (TOBREX) 0.3 % ophthalmic solution, SMARTSIG:In Eye(s), Disp: , Rfl:    warfarin (COUMADIN ) 5 MG tablet, TAKE 1 TABLET ON SATURDAY, SUNDAY, TUESDAY, & THURSDAY. TAKE 1/2 TABLET ON MONDAY, WEDNESDAY, & FRIDAY, Disp: 70 tablet, Rfl: 0 No current facility-administered medications for this visit.  Facility-Administered Medications Ordered in Other Visits:    0.9 %  sodium chloride  infusion, , Intravenous, Continuous, [name], MD, Last Rate: 10 mL/hr at 02/10/24 1106, New Bag at 02/10/24 1106   cetuximab  (ERBITUX ) chemo infusion 800 mg, 400 mg/m2 (Treatment Plan Recorded), Intravenous, Once, [name], MD   Allergies: Allergies  Allergen Reactions   Penicillins Hives and Rash    Has patient had a PCN reaction causing immediate rash, facial/tongue/throat swelling, SOB or lightheadedness with hypotension: Yes Has patient had a PCN reaction causing severe rash involving mucus membranes or skin necrosis: Yes Has patient had a PCN reaction that required hospitalization: No Has patient had a PCN reaction occurring within the last 10 years: No If all of the above answers are "NO", then may proceed with Cephalosporin use.    Cetuximab  Cough    Cough and scratchy throat. Drug rechallenged and pt able to tolerated the rest of the infusion  without any complications.    Hct [Hydrochlorothiazide] Other (See Comments)    hyper   Statins Other (See Comments)    Myalgia, tolerates low dosages of lovastatin      REVIEW OF SYSTEMS:   Review of Systems  Constitutional:  Negative for chills, fatigue and fever.  HENT:   Negative for lump/mass, mouth sores, nosebleeds, sore throat and trouble swallowing.   Eyes:  Negative for eye problems.  Respiratory:  Positive for shortness of breath. Negative for cough.   Cardiovascular:  Negative for chest pain, leg swelling and palpitations.  Gastrointestinal:  Negative for abdominal pain, constipation, diarrhea, nausea and vomiting.  Genitourinary:  Positive for dysuria. Negative for bladder incontinence, difficulty urinating, frequency, hematuria and nocturia.   Musculoskeletal:  Negative for arthralgias, back pain, flank pain, myalgias and neck pain.  Skin:  Negative for itching and rash.  Neurological:  Positive for dizziness. Negative for headaches and numbness.       +tingling in hands  Hematological:  Does not bruise/bleed easily.  Psychiatric/Behavioral:  Positive for sleep disturbance. Negative for depression and suicidal ideas. The patient is not nervous/anxious.   All other systems reviewed and are negative.    VITALS:   There were no vitals taken for this visit.  Wt Readings from Last 3 Encounters:  02/10/24 174 lb 6.1 oz (79.1 kg)  01/25/24 174 lb 13.2 oz (79.3 kg)  01/04/24 176 lb 5.9 oz (80 kg)    There is no height or weight on file to calculate BMI.  Performance status (ECOG): 1 - Symptomatic but completely ambulatory  PHYSICAL EXAM:   Physical Exam Vitals and nursing note reviewed. Exam conducted with a chaperone present.  Constitutional:      Appearance: Normal appearance.  Cardiovascular:     Rate and Rhythm: Normal rate and regular rhythm.     Pulses: Normal pulses.     Heart sounds: Normal heart sounds.  Pulmonary:     Effort: Pulmonary effort is normal.      Breath sounds: Normal breath sounds.  Abdominal:     Palpations: Abdomen is soft. There is no hepatomegaly, splenomegaly or mass.     Tenderness: There is no abdominal tenderness.  Musculoskeletal:     Right lower leg: No edema.     Left lower leg: No edema.  Lymphadenopathy:     Cervical: No cervical adenopathy.     Right cervical: No superficial, deep or posterior cervical adenopathy.    Left cervical: No superficial, deep or posterior cervical adenopathy.     Upper Body:     Right upper body: No supraclavicular or axillary adenopathy.     Left upper body: No supraclavicular or axillary adenopathy.  Neurological:     General: No focal deficit present.     Mental Status: He is alert and oriented to person, place, and time.  Psychiatric:        Mood and Affect: Mood normal.        Behavior: Behavior normal.     LABS:      Latest Ref Rng & Units 02/10/2024    9:49 AM 01/25/2024    8:29 AM 01/13/2024    8:39 AM  CBC  WBC 4.0 - 10.5 K/uL 12.9  12.7  12.9   Hemoglobin 13.0 - 17.0 g/dL 62.1  30.8  65.7   Hematocrit 39.0 - 52.0 % 41.5  41.7  42.1   Platelets 150 - 400 K/uL 249  214  258       Latest Ref Rng & Units 02/10/2024    9:49 AM 01/25/2024    8:29 AM 01/13/2024    8:39 AM  CMP  Glucose 70 - 99 mg/dL 846  962  952   BUN 8 - 23 mg/dL 15  14  14    Creatinine 0.61 - 1.24 mg/dL 8.41  3.24  4.01   Sodium 135 - 145 mmol/L 140  140  140   Potassium 3.5 - 5.1 mmol/L 3.2  3.5  3.3   Chloride 98 - 111 mmol/L 103  103  104   CO2 22 - 32 mmol/L 26  27  28    Calcium  8.9 - 10.3 mg/dL 9.1  8.9  8.8   Total Protein 6.5 - 8.1 g/dL 6.1  6.1  6.2   Total Bilirubin 0.0 - 1.2 mg/dL 0.7  0.8  0.8   Alkaline Phos 38 - 126 U/L 90  97  104   AST 15 - 41 U/L 21  27  26    ALT 0 - 44  U/L 16  26  23       Lab Results  Component Value Date   CEA1 5.4 (H) 06/03/2022   /  CEA  Date Value Ref Range Status  06/03/2022 5.4 (H) 0.0 - 4.7 ng/mL Final    Comment:    (NOTE)                              Nonsmokers          <3.9                             Smokers             <5.6 [employer] Electrochemiluminescence Immunoassay (ECLIA) Values obtained with different assay methods or kits cannot be used interchangeably.  Results cannot be interpreted as absolute evidence of the presence or absence of malignant disease. Performed At: [facility] [address] [place], [place] [id number] Pearlean Botts MD NW:[id number]    Lab Results  Component Value Date   PSA1 11.3 (H) 02/16/2022   Lab Results  Component Value Date   CAN199 73 (H) 06/03/2022   No results found for: "CAN125"  No results found for: "TOTALPROTELP", "ALBUMINELP", "A1GS", "A2GS", "BETS", "BETA2SER", "GAMS", "MSPIKE", "SPEI" No results found for: "TIBC", "FERRITIN", "IRONPCTSAT" Lab Results  Component Value Date   LDH 164 06/03/2022     STUDIES:   No results found.

## 2024-02-10 NOTE — Patient Instructions (Signed)

## 2024-02-10 NOTE — Progress Notes (Signed)
 Patients port flushed without difficulty.  Good blood return noted with no bruising or swelling noted at site.  Patient remains accessed for treatment.

## 2024-02-11 ENCOUNTER — Other Ambulatory Visit: Payer: Self-pay

## 2024-02-28 ENCOUNTER — Inpatient Hospital Stay

## 2024-02-28 ENCOUNTER — Other Ambulatory Visit: Payer: Self-pay

## 2024-02-28 ENCOUNTER — Inpatient Hospital Stay: Admitting: Hematology

## 2024-02-28 VITALS — BP 146/90 | HR 71 | Temp 97.7°F | Resp 18

## 2024-02-28 VITALS — BP 116/82 | HR 92 | Temp 98.8°F | Resp 20 | Wt 175.5 lb

## 2024-02-28 DIAGNOSIS — Z7952 Long term (current) use of systemic steroids: Secondary | ICD-10-CM | POA: Diagnosis not present

## 2024-02-28 DIAGNOSIS — Z5111 Encounter for antineoplastic chemotherapy: Secondary | ICD-10-CM | POA: Diagnosis not present

## 2024-02-28 DIAGNOSIS — C4442 Squamous cell carcinoma of skin of scalp and neck: Secondary | ICD-10-CM

## 2024-02-28 DIAGNOSIS — C760 Malignant neoplasm of head, face and neck: Secondary | ICD-10-CM | POA: Diagnosis not present

## 2024-02-28 DIAGNOSIS — C778 Secondary and unspecified malignant neoplasm of lymph nodes of multiple regions: Secondary | ICD-10-CM | POA: Diagnosis not present

## 2024-02-28 DIAGNOSIS — C4492 Squamous cell carcinoma of skin, unspecified: Secondary | ICD-10-CM

## 2024-02-28 DIAGNOSIS — Z87891 Personal history of nicotine dependence: Secondary | ICD-10-CM | POA: Diagnosis not present

## 2024-02-28 DIAGNOSIS — R530 Neoplastic (malignant) related fatigue: Secondary | ICD-10-CM | POA: Diagnosis not present

## 2024-02-28 DIAGNOSIS — E876 Hypokalemia: Secondary | ICD-10-CM

## 2024-02-28 DIAGNOSIS — Z801 Family history of malignant neoplasm of trachea, bronchus and lung: Secondary | ICD-10-CM | POA: Diagnosis not present

## 2024-02-28 DIAGNOSIS — R918 Other nonspecific abnormal finding of lung field: Secondary | ICD-10-CM | POA: Diagnosis not present

## 2024-02-28 DIAGNOSIS — Z95828 Presence of other vascular implants and grafts: Secondary | ICD-10-CM

## 2024-02-28 DIAGNOSIS — C7989 Secondary malignant neoplasm of other specified sites: Secondary | ICD-10-CM | POA: Diagnosis not present

## 2024-02-28 DIAGNOSIS — R21 Rash and other nonspecific skin eruption: Secondary | ICD-10-CM | POA: Diagnosis not present

## 2024-02-28 DIAGNOSIS — D72820 Lymphocytosis (symptomatic): Secondary | ICD-10-CM | POA: Diagnosis not present

## 2024-02-28 LAB — MAGNESIUM: Magnesium: 1.7 mg/dL (ref 1.7–2.4)

## 2024-02-28 LAB — CBC WITH DIFFERENTIAL/PLATELET
Abs Immature Granulocytes: 0.06 10*3/uL (ref 0.00–0.07)
Basophils Absolute: 0.1 10*3/uL (ref 0.0–0.1)
Basophils Relative: 0 %
Eosinophils Absolute: 0.5 10*3/uL (ref 0.0–0.5)
Eosinophils Relative: 3 %
HCT: 41.9 % (ref 39.0–52.0)
Hemoglobin: 13.9 g/dL (ref 13.0–17.0)
Immature Granulocytes: 0 %
Lymphocytes Relative: 32 %
Lymphs Abs: 4.8 10*3/uL — ABNORMAL HIGH (ref 0.7–4.0)
MCH: 34.4 pg — ABNORMAL HIGH (ref 26.0–34.0)
MCHC: 33.2 g/dL (ref 30.0–36.0)
MCV: 103.7 fL — ABNORMAL HIGH (ref 80.0–100.0)
Monocytes Absolute: 0.9 10*3/uL (ref 0.1–1.0)
Monocytes Relative: 6 %
Neutro Abs: 8.8 10*3/uL — ABNORMAL HIGH (ref 1.7–7.7)
Neutrophils Relative %: 59 %
Platelets: 216 10*3/uL (ref 150–400)
RBC: 4.04 MIL/uL — ABNORMAL LOW (ref 4.22–5.81)
RDW: 12.8 % (ref 11.5–15.5)
WBC: 15.2 10*3/uL — ABNORMAL HIGH (ref 4.0–10.5)
nRBC: 0 % (ref 0.0–0.2)

## 2024-02-28 LAB — COMPREHENSIVE METABOLIC PANEL WITH GFR
ALT: 18 U/L (ref 0–44)
AST: 23 U/L (ref 15–41)
Albumin: 3.3 g/dL — ABNORMAL LOW (ref 3.5–5.0)
Alkaline Phosphatase: 79 U/L (ref 38–126)
Anion gap: 7 (ref 5–15)
BUN: 12 mg/dL (ref 8–23)
CO2: 28 mmol/L (ref 22–32)
Calcium: 8.6 mg/dL — ABNORMAL LOW (ref 8.9–10.3)
Chloride: 102 mmol/L (ref 98–111)
Creatinine, Ser: 0.91 mg/dL (ref 0.61–1.24)
GFR, Estimated: >60 mL/min (ref >60)
Glucose, Bld: 155 mg/dL — ABNORMAL HIGH (ref 70–99)
Potassium: 3 mmol/L — ABNORMAL LOW (ref 3.5–5.1)
Sodium: 137 mmol/L (ref 135–145)
Total Bilirubin: 0.8 mg/dL (ref 0.0–1.2)
Total Protein: 6.2 g/dL — ABNORMAL LOW (ref 6.5–8.1)

## 2024-02-28 MED ORDER — POTASSIUM CHLORIDE CRYS ER 20 MEQ PO TBCR
40.0000 meq | EXTENDED_RELEASE_TABLET | Freq: Once | ORAL | Status: AC
  Administered 2024-02-28: 40 meq via ORAL
  Filled 2024-02-28: qty 2

## 2024-02-28 MED ORDER — SODIUM CHLORIDE 0.9% FLUSH
10.0000 mL | INTRAVENOUS | Status: DC | PRN
  Administered 2024-02-28: 10 mL via INTRAVENOUS

## 2024-02-28 MED ORDER — HEPARIN SOD (PORK) LOCK FLUSH 100 UNIT/ML IV SOLN
500.0000 [IU] | Freq: Once | INTRAVENOUS | Status: AC | PRN
  Administered 2024-02-28: 500 [IU]

## 2024-02-28 MED ORDER — DIPHENHYDRAMINE HCL 50 MG/ML IJ SOLN
50.0000 mg | Freq: Once | INTRAMUSCULAR | Status: AC
  Administered 2024-02-28: 50 mg via INTRAVENOUS
  Filled 2024-02-28: qty 1

## 2024-02-28 MED ORDER — DOXYCYCLINE HYCLATE 100 MG PO TABS: 100.0000 mg | ORAL_TABLET | Freq: Two times a day (BID) | ORAL | 2 refills | Status: DC

## 2024-02-28 MED ORDER — MAGNESIUM OXIDE -MG SUPPLEMENT 400 (240 MG) MG PO TABS: 400.0000 mg | ORAL_TABLET | Freq: Two times a day (BID) | ORAL | 3 refills | Status: DC

## 2024-02-28 MED ORDER — CETUXIMAB CHEMO IV INJECTION 200 MG/100ML
400.0000 mg/m2 | Freq: Once | INTRAVENOUS | Status: AC
  Administered 2024-02-28: 800 mg via INTRAVENOUS
  Filled 2024-02-28: qty 400

## 2024-02-28 MED ORDER — POTASSIUM CHLORIDE CRYS ER 20 MEQ PO TBCR: 20.0000 meq | EXTENDED_RELEASE_TABLET | Freq: Every day | ORAL | 3 refills | Status: DC

## 2024-02-28 MED ORDER — SODIUM CHLORIDE 0.9% FLUSH
10.0000 mL | INTRAVENOUS | Status: DC | PRN
  Administered 2024-02-28: 10 mL

## 2024-02-28 MED ORDER — SODIUM CHLORIDE 0.9 % IV SOLN: INTRAVENOUS | Status: DC

## 2024-02-28 MED ORDER — METHYLPREDNISOLONE SODIUM SUCC 125 MG IJ SOLR
125.0000 mg | Freq: Once | INTRAMUSCULAR | Status: AC
  Administered 2024-02-28: 125 mg via INTRAVENOUS
  Filled 2024-02-28: qty 2

## 2024-02-28 MED ORDER — FAMOTIDINE IN NACL 20-0.9 MG/50ML-% IV SOLN
20.0000 mg | Freq: Once | INTRAVENOUS | Status: AC
  Administered 2024-02-28: 20 mg via INTRAVENOUS
  Filled 2024-02-28: qty 50

## 2024-02-28 NOTE — Patient Instructions (Signed)
 [facility] - A DEPT OF [facility]  Discharge Instructions: Thank you for choosing [facility] to provide your oncology and hematology care.  If you have a lab appointment with the Cancer Center - please note that after April 8th, 2024, all labs will be drawn in the cancer center.  You do not have to check in or register with the main entrance as you have in the past but will complete your check-in in the cancer center.  Wear comfortable clothing and clothing appropriate for easy access to any Portacath or PICC line.   We strive to give you quality time with your provider. You may need to reschedule your appointment if you arrive late (15 or more minutes).  Arriving late affects you and other patients whose appointments are after yours.  Also, if you miss three or more appointments without notifying the office, you may be dismissed from the clinic at the provider's discretion.      For prescription refill requests, have your pharmacy contact our office and allow 72 hours for refills to be completed.    Today you received the following chemotherapy and/or immunotherapy agents Erbitux.  Cetuximab Injection What is this medication? CETUXIMAB (se TUX i mab) treats head and neck cancer. It may also be used to treat colorectal cancer. It works by blocking a protein that causes cancer cells to grow and multiply. This helps to slow or stop the spread of cancer cells. It is a monoclonal antibody. This medicine may be used for other purposes; ask your health care provider or pharmacist if you have questions. COMMON BRAND NAME(S): Erbitux What should I tell my care team before I take this medication? They need to know if you have any of these conditions: Heart disease History of tick bites Low levels of calcium, magnesium, or potassium in the blood Lung disease Red meat allergy An unusual or allergic reaction to cetuximab, other medications, foods, dyes,  or preservatives Pregnant or trying to get pregnant Breast-feeding How should I use this medication? This medication is infused into a vein. It is given by your care team in a hospital or clinic setting. Talk to your care team about the use of this medication in children. Special care may be needed. Overdosage: If you think you have taken too much of this medicine contact a poison control center or emergency room at once. NOTE: This medicine is only for you. Do not share this medicine with others. What if I miss a dose? Keep appointments for follow-up doses. It is important not to miss your dose. Call your care team if you are unable to keep an appointment. What may interact with this medication? Interactions are not expected. This list may not describe all possible interactions. Give your health care provider a list of all the medicines, herbs, non-prescription drugs, or dietary supplements you use. Also tell them if you smoke, drink alcohol, or use illegal drugs. Some items may interact with your medicine. What should I watch for while using this medication? Visit your care team for regular checks on your progress. This medication may make you feel generally unwell. This is not uncommon, as chemotherapy can affect healthy cells as well as cancer cells. Report any side effects. Continue your course of treatment even though you feel ill unless your care team tells you to stop. You may need blood work done while you are taking this medication. This medication can make you more sensitive  to the sun. Keep out of the sun while taking this medication and for 2 months after the last dose. If you cannot avoid being in the sun, wear protective clothing and sunscreen. Do not use sun lamps, tanning beds, or tanning booths. This medication can cause serious infusion reactions. To reduce the risk, your care team may give you other medications to take before receiving this one. Be sure to follow the directions of  your care team. This medication may cause serious skin reactions. They can happen weeks to months after starting the medication. Contact your care team right away if you notice fevers or flu-like symptoms with a rash. The rash may be red or purple and then turn into blisters or peeling of the skin. You may also notice a red rash with swelling of the face, lips, or lymph nodes in your neck or under your arms. Talk to your care team if you may be pregnant. Serious birth defects can occur if you take this medication during pregnancy and for 2 months after the last dose. You will need a negative pregnancy test before starting this medication. Contraception is recommended while taking this medication and for 2 months after the last dose. Your care team can help you find the option that works for you. Do not breastfeed while taking this medication and for 2 months after the last dose. This medication may cause infertility. Talk to your care team if you are concerned about your fertility. What side effects may I notice from receiving this medication? Side effects that you should report to your care team as soon as possible: Allergic reactions--skin rash, itching, hives, swelling of the face, lips, tongue, or throat Dry cough, shortness of breath or trouble breathing Heart attack--pain or tightness in the chest, shoulders, arms, or jaw, nausea, shortness of breath, cold or clammy skin, feeling faint or lightheaded Infusion reactions--chest pain, shortness of breath or trouble breathing, feeling faint or lightheaded Low calcium level--muscle pain or cramps, confusion, tingling, or numbness in the hands or feet Low magnesium level--muscle pain or cramps, unusual weakness or fatigue, fast or irregular heartbeat, tremors Low potassium level--muscle pain or cramps, unusual weakness or fatigue, fast or irregular heartbeat, constipation Redness, blistering, peeling, or loosening of the skin, including inside the  mouth Side effects that usually do not require medical attention (report to your care team if they continue or are bothersome): Diarrhea Headache Joint pain Nausea Unusual weakness or fatigue Weight loss This list may not describe all possible side effects. Call your doctor for medical advice about side effects. You may report side effects to FDA at 1-800-FDA-1088. Where should I keep my medication? This medication is given in a hospital or clinic. It will not be stored at home. NOTE: This sheet is a summary. It may not cover all possible information. If you have questions about this medicine, talk to your doctor, pharmacist, or health care provider.  2024 Elsevier/Gold Standard (2022-02-13 00:00:00)       To help prevent nausea and vomiting after your treatment, we encourage you to take your nausea medication as directed.  BELOW ARE SYMPTOMS THAT SHOULD BE REPORTED IMMEDIATELY: *FEVER GREATER THAN 100.4 F (38 C) OR HIGHER *CHILLS OR SWEATING *NAUSEA AND VOMITING THAT IS NOT CONTROLLED WITH YOUR NAUSEA MEDICATION *UNUSUAL SHORTNESS OF BREATH *UNUSUAL BRUISING OR BLEEDING *URINARY PROBLEMS (pain or burning when urinating, or frequent urination) *BOWEL PROBLEMS (unusual diarrhea, constipation, pain near the anus) TENDERNESS IN MOUTH AND THROAT WITH OR WITHOUT PRESENCE  OF ULCERS (sore throat, sores in mouth, or a toothache) UNUSUAL RASH, SWELLING OR PAIN  UNUSUAL VAGINAL DISCHARGE OR ITCHING   Items with * indicate a potential emergency and should be followed up as soon as possible or go to the Emergency Department if any problems should occur.  Please show the CHEMOTHERAPY ALERT CARD or IMMUNOTHERAPY ALERT CARD at check-in to the Emergency Department and triage nurse.  Should you have questions after your visit or need to cancel or reschedule your appointment, please contact [facility] [place] - A DEPT OF Eligha Brideg[facility] [phone]  and follow the prompts.   Office hours are 8:00 a.m. to 4:30 p.m. Monday - Friday. Please note that voicemails left after 4:00 p.m. may not be returned until the following business day.  We are closed weekends and major holidays. You have access to a nurse at all times for urgent questions. Please call the main number to the clinic [phone] and follow the prompts.  For any non-urgent questions, you may also contact your provider using MyChart. We now offer e-Visits for anyone 82 and older to request care online for non-urgent symptoms. For details visit [email or web address].   Also download the MyChart app! Go to the app store, search "MyChart", open the app, select [facility], and log in with your MyChart username and password.

## 2024-02-28 NOTE — Progress Notes (Signed)
 Patient presents today for chemotherapy infusion. Patient is in satisfactory condition with no new complaints voiced.  Vital signs are stable.  Labs reviewed by Dr. Cheree Cords during the office visit and all labs are within treatment parameters.  Potassium today is 3.0.  We will give Klor Con 40 mEq PO x one dose today per standing orders by Dr. Cheree Cords.  We will proceed with treatment per MD orders.   Patient tolerated treatment well with no complaints voiced.  Patient left via wheelchair with wife in stable condition.  Vital signs stable at discharge.  Follow up as scheduled.

## 2024-02-28 NOTE — Progress Notes (Signed)
 [facility] [address] [place] [address]    Clinic Day:  02/28/2024  Referring physician: [name], [name], MD  Patient Care Team: [name], [name], MD as PCP - General (Family Medicine) [name], MD as PCP - Cardiology (Cardiology) [name], MD as Attending Physician (Urology) [name], MD as Consulting Physician (Cardiology) [name], MD (Inactive) as Consulting Physician (Gastroenterology) [name], MD as Consulting Physician (Ophthalmology) [name], MD as Consulting Physician (Orthopedic Surgery) [name], [name], LCSW as [facility] Care Management (Licensed Clinical Social Worker) [name], MD as Medical Oncologist (Medical Oncology) Gerhard Knuckles, RN as Oncology Nurse Navigator (Medical Oncology)   ASSESSMENT & PLAN:   Assessment: 1.  Right supraclavicular lymphadenopathy and multiple lung nodules: - Patient noticed right neck mass since Christmas 2022.  10 to 15 pound weight loss since January 2023.  Denies any dysphagia or odynophagia. - CT neck (05/27/2022): Numerous lymph nodes in the right neck, right supraclavicular lymph node mass measures 5.7 x 3.7 cm.  18 mm posterior lymph node in the right lower neck.  Numerous lymph nodes in the right lower and posterior neck approximately 1 cm.  Many have internal necrosis consistent with metastatic disease.  20 mm lymph node just above the right clavicle.  Left level 2 node 12 mm. - CT CAP (05/28/2022): Several bilateral lung nodules, RUL nodule measuring 1.8 x 1.3 cm.  Small clusters of nodules in the medial left upper lobe.  Irregular nodular density along the left side of the mediastinum measuring 2.1 cm.  No metastatic disease in the abdomen or pelvis.  Liver is slightly nodular contour. - Lab work shows elevated white count since 2009, predominantly lymphocytosis. - Pathology (06/09/2022): Metastatic carcinoma positive for p40, p63  and CK5/6-patchy positivity with CK7.  Negative for TTF-1, Napsin A, PAX8, prostein, PSA, CK20 and CDX2.  - PET scan (06/11/2022): Bulky right supraclavicular nodal mass, smaller hypermetabolic right posterior triangle and jugular lymph nodes and single hypermetabolic left level 3 lymph node.  Bilateral hypermetabolic pulmonary nodules and metastatic pattern.  Minimal hypermetabolic mediastinal nodal metastasis.  Cluster of hypermetabolic right axillary lymph nodes.  No evidence of metastatic disease or primary lesion in the abdomen or pelvis.  No bone lesions. - MRI of the brain: Chronic small vessel ischemic changes with no evidence of metastatic disease. - NGS: T p53 pathogenic variant, MSI-stable, TMB-low, LOH-low, PD-L1 (SP142): Negative, p16 and p18 negative.  HER2 by IHC 0. - PD-L1 22 C3: CPS score 25 - Cancer type ID: 96% probability squamous cell carcinoma, subtype head and neck/skin.  Cancer types ruled out with 95% confidence includes skin basal cell carcinoma. - Carboplatin , Taxol  and pembrolizumab  07/23/2022 through 11/09/22 - Right axillary lymph node biopsy (08/04/2023): Metastatic moderate to poorly differentiated squamous cell carcinoma. - Cetuximab  every 2 weeks started on 10/25/2023   2.  Social/family history: - He lives at home with his wife.  Independent of ADLs and IADLs.  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  May have had asbestos exposure 30 years ago.  Quit smoking cigarettes in 1965.  Smoked 1 pack/day for less than 12 years.  2 of his brothers had lung cancer and both were smokers.  3.  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    Plan: 1.  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  Right axillary lymph node increased by 2 mm.  Other  mediastinal, hilar and supraclavicular lymph nodes are stable.  No evidence of pulmonary metastatic disease.  New superior endplate compression deformity at T12 with less than 10% loss of vertebral height. - CT soft tissue neck on 12/30/2023: Lymphadenopathy in the mid to lower neck is largely stable. - Cetuximab  was started back on last visit 2 weeks ago. - He was seen by Dr. Lincoln Renshaw of ophthalmology and had few more ingrowing eyelashes removed.  He was offered the option of cauterization. - Reviewed labs today: Normal LFTs and creatinine.  CBC grossly normal.  He may proceed with cetuximab  today.  RTC 2 weeks for follow-up with repeat scans.   2.  EGFR antibody induced rash: - He ran out of doxycycline  a week ago.  Continue clindamycin  gel for face twice daily.  Will continue fluocinonide  0.05% twice daily for the rest of the body.  Rash has gotten slightly worse.  We will send doxycycline  refill.   3.  Cancer-related fatigue: - Continue Ritalin  5 mg in the mornings as needed for fatigue.  4.  Polyarthralgias: - He reports that he is not taking prednisone  and denies any arthralgias at this time.  5.  Hypomagnesemia: - Continue magnesium  twice daily.  Magnesium  is normal.  6.  Hypokalemia: - He has constant hypokalemia.  Will start him on K-Dur 20 mill equivalents daily.    Orders Placed This Encounter  Procedures   CT SOFT TISSUE NECK W CONTRAST    Standing Status:   Future    Expected Date:   03/13/2024    Expiration Date:   02/27/2025    If indicated for the ordered procedure, I authorize the administration of contrast media per Radiology protocol:   Yes    Does the patient have a contrast media/X-ray dye allergy?:   No    Preferred imaging location?:   [facility]   CT CHEST ABDOMEN PELVIS W CONTRAST    Standing Status:   Future    Expected Date:   03/13/2024    Expiration Date:   02/27/2025    If indicated for the ordered procedure, I authorize the administration of contrast  media per Radiology protocol:   Yes    Does the patient have a contrast media/X-ray dye allergy?:   No    Preferred imaging location?:   [facility]    If indicated for the ordered procedure, I authorize the administration of oral contrast media per Radiology protocol:   Yes     I,Helena R Teague,acting as a scribe for [name], MD.,have documented all relevant documentation on the behalf of [name], MD,as directed by  [name], MD while in the presence of [name], MD.  I, [name] MD, have reviewed the above documentation for accuracy and completeness, and I agree with the above.     [name], MD   5/19/20251:06 PM  CHIEF COMPLAINT:   Diagnosis: metastatic squamous cell carcinoma    Cancer Staging  Squamous cell carcinoma of head and neck Staging form: Cervical Lymph Nodes and Unknown Primary Tumors of the Head and Neck, AJCC 8th Edition - Clinical stage from 07/16/2022: Stage IVC (cT0, cN3b, cM1) - Unsigned    Prior Therapy: Carboplatin , paclitaxel  and pembrolizumab , 07/23/22 - 11/09/22   Current Therapy: Cetuximab    HISTORY OF PRESENT ILLNESS:   Oncology History  Squamous  cell carcinoma of head and neck  07/16/2022 Initial Diagnosis   Squamous cell carcinoma of head and neck (HCC)   07/23/2022 - 09/27/2023 Chemotherapy   Patient is on Treatment Plan : Carboplatin   + Paclitaxel   + Pembrolizumab  (200) D1 q21d     10/25/2023 -  Chemotherapy   Patient is on Treatment Plan : HEAD/NECK Cetuximab  q14d        INTERVAL HISTORY:   Mylz is a 89 y.o. male presenting to clinic today for follow up of metastatic squamous cell carcinoma. He was last seen by me on 02/10/24.  Today, he states that he is doing well overall. His appetite level is at 50%. His energy level is at 20%.  He is accompanied by his wife.  PAST MEDICAL HISTORY:   Past Medical History: Past Medical History:  Diagnosis Date   Anemia     Atrial fibrillation (HCC)    Cataract    Cellulitis    In past   Complication of anesthesia    HARD TIME WAKING; CAUSES MY BP TO GO UP    Coronary artery disease    5 bypasses   DJD (degenerative joint disease)    Ejection fraction < 50%    Mildly reduced, 40% by echo   GERD (gastroesophageal reflux disease)    Hyperlipidemia    Hypertension    Persistent atrial fibrillation (HCC)    Prostate hypertrophy    on CT scan 09/2014   PUD (peptic ulcer disease)    RESOLVED    Skin cancer of eyelid    Resected    Surgical History: Past Surgical History:  Procedure Laterality Date   APPENDECTOMY     BYPASS GRAFT  2005   CATARACT EXTRACTION W/PHACO Left 07/22/2015   Procedure: CATARACT EXTRACTION PHACO AND INTRAOCULAR LENS PLACEMENT LEFT EYE CDE=8.14;  Surgeon: [name], MD;  Location: [facility];  Service: Ophthalmology;  Laterality: Left;   CATARACT EXTRACTION W/PHACO Right 08/18/2019   Procedure: CATARACT EXTRACTION PHACO AND INTRAOCULAR LENS PLACEMENT (IOC);  Surgeon: Tarri Farm, MD;  Location: [facility];  Service: Ophthalmology;  Laterality: Right;  CDE: 9.74   CORONARY ARTERY BYPASS GRAFT  4/05   LIMA to LAD, SVG to PDA, SVG to ramus intermediate   EYE SURGERY  07/2015   EYELID CARCINOMA EXCISION     Skin cancer resected   Heart bypass  2005   LYMPH NODE BIOPSY Right 06/09/2022   Procedure: LYMPH NODE BIOPSY; supraclavicular;  Surgeon: Awilda Bogus, MD;  Location: [facility];  Service: General;  Laterality: Right;   PORTACATH PLACEMENT Left 06/09/2022   Procedure: INSERTION PORT-A-CATH;  Surgeon: Awilda Bogus, MD;  Location: [facility];  Service: General;  Laterality: Left;   TOTAL HIP ARTHROPLASTY     Right   TOTAL HIP ARTHROPLASTY Left 05/04/2018   Procedure: LEFT TOTAL HIP ARTHROPLASTY;  Surgeon: [name], MD;  Location: [facility];  Service: Orthopedics;  Laterality: Left;   TOTAL KNEE ARTHROPLASTY     Right    Social History: Social History   Socioeconomic  History   Marital status: Married    Spouse name: Monroe Antigua   Number of children: 1   Years of education: 10   Highest education level: 10th grade  Occupational History   Occupation: retired  Tobacco Use   Smoking status: Former    Current packs/day: 0.00    Average packs/day: 2.0 packs/day for 5.0 years (10.0 ttl pk-yrs)    Types: Cigarettes    Start date:  12/23/1958    Quit date: 12/23/1963    Years since quitting: 60.2   Smokeless tobacco: Never  Vaping Use   Vaping status: Never Used  Substance and Sexual Activity   Alcohol  use: No   Drug use: No   Sexual activity: Yes  Other Topics Concern   Not on file  Social History Narrative   Lives in a split level home.   Social Drivers of Corporate investment banker Strain: Low Risk  (10/25/2023)   Overall Financial Resource Strain (CARDIA)    Difficulty of Paying Living Expenses: Not hard at all  Food Insecurity: No Food Insecurity (10/25/2023)   Hunger Vital Sign    Worried About Running Out of Food in the Last Year: Never true    Ran Out of Food in the Last Year: Never true  Transportation Needs: No Transportation Needs (10/25/2023)   PRAPARE - Administrator, Civil Service (Medical): No    Lack of Transportation (Non-Medical): No  Physical Activity: Inactive (10/25/2023)   Exercise Vital Sign    Days of Exercise per Week: 1 day    Minutes of Exercise per Session: 0 min  Stress: No Stress Concern Present (10/25/2023)   [employer] of Occupational Health - Occupational Stress Questionnaire    Feeling of Stress : Only a little  Social Connections: Moderately Isolated (10/25/2023)   Social Connection and Isolation Panel [NHANES]    Frequency of Communication with Friends and Family: More than three times a week    Frequency of Social Gatherings with Friends and Family: Twice a week    Attends Religious Services: Never    Database administrator or Organizations: No    Attends Engineer, structural: Not  on file    Marital Status: Married  Catering manager Violence: Not At Risk (01/06/2023)   Humiliation, Afraid, Rape, and Kick questionnaire    Fear of Current or Ex-Partner: No    Emotionally Abused: No    Physically Abused: No    Sexually Abused: No    Family History: Family History  Problem Relation Age of Onset   Stroke Mother    Stroke Father    Hypertension Father    Early death Brother        36 months old   Cancer Brother        lung   Stroke Brother        heat    Current Medications:  Current Outpatient Medications:    acetaminophen  (TYLENOL ) 500 MG tablet, Take 1,000 mg by mouth every 6 (six) hours as needed for moderate pain., Disp: , Rfl:    Cholecalciferol (VITAMIN D3) 5000 units CAPS, Take 5,000 Units by mouth once a week. , Disp: , Rfl:    clindamycin  (CLINDAGEL) 1 % gel, Apply topically 2 (two) times daily., Disp: 60 g, Rfl: 3   diltiazem  (CARDIZEM  CD) 120 MG 24 hr capsule, TAKE ONE (1) CAPSULE EACH DAY, Disp: 90 capsule, Rfl: 3   ezetimibe  (ZETIA ) 10 MG tablet, TAKE ONE (1) TABLET BY MOUTH EVERY DAY, Disp: 90 tablet, Rfl: 0   finasteride  (PROSCAR ) 5 MG tablet, TAKE ONE (1) TABLET EACH DAY, Disp: 90 tablet, Rfl: 3   fluocinonide  cream (LIDEX ) 0.05 %, Apply 1 Application topically 2 (two) times daily., Disp: , Rfl:    fluocinonide -emollient (LIDEX -E) 0.05 % cream, Apply 1 Application topically 2 (two) times daily. Do not apply on face, Disp: 60 g, Rfl: 3   fluticasone  (FLONASE ) 50  MCG/ACT nasal spray, Place 1 spray into both nostrils daily as needed for allergies or rhinitis., Disp: , Rfl:    furosemide  (LASIX ) 20 MG tablet, Take 1 tablet (20 mg total) by mouth daily., Disp: 90 tablet, Rfl: 3   lovastatin  (MEVACOR ) 40 MG tablet, Take 1 tablet (40 mg total) by mouth at bedtime., Disp: 90 tablet, Rfl: 3   methylphenidate  (RITALIN ) 5 MG tablet, TAKE 1 TABLET BY MOUTH DAILY AS NEEDED, Disp: 30 tablet, Rfl: 0   metoprolol  tartrate (LOPRESSOR ) 50 MG tablet, Take 1  tablet (50 mg total) by mouth 2 (two) times daily., Disp: 180 tablet, Rfl: 3   montelukast  (SINGULAIR ) 10 MG tablet, Take 1 tablet (10 mg total) by mouth at bedtime., Disp: 90 tablet, Rfl: 3   potassium chloride  SA (KLOR-CON  M) 20 MEQ tablet, Take 1 tablet (20 mEq total) by mouth daily., Disp: 30 tablet, Rfl: 3   predniSONE  (DELTASONE ) 5 MG tablet, Take 1 tablet (5 mg total) by mouth daily with breakfast., Disp: 90 tablet, Rfl: 2   tobramycin (TOBREX) 0.3 % ophthalmic solution, SMARTSIG:In Eye(s), Disp: , Rfl:    warfarin (COUMADIN ) 5 MG tablet, TAKE 1 TABLET ON SATURDAY, SUNDAY, TUESDAY, & THURSDAY. TAKE 1/2 TABLET ON MONDAY, WEDNESDAY, & FRIDAY, Disp: 70 tablet, Rfl: 0   doxycycline  (VIBRA -TABS) 100 MG tablet, Take 1 tablet (100 mg total) by mouth 2 (two) times daily., Disp: 120 tablet, Rfl: 2   magnesium  oxide (MAG-OX) 400 (240 Mg) MG tablet, Take 1 tablet (400 mg total) by mouth 2 (two) times daily., Disp: 60 tablet, Rfl: 3 No current facility-administered medications for this visit.  Facility-Administered Medications Ordered in Other Visits:    0.9 %  sodium chloride  infusion, , Intravenous, Continuous, [name], MD, Last Rate: 10 mL/hr at 02/28/24 1111, New Bag at 02/28/24 1111   cetuximab  (ERBITUX ) chemo infusion 800 mg, 400 mg/m2 (Treatment Plan Recorded), Intravenous, Once, [name], MD, Last Rate: 200 mL/hr at 02/28/24 1215, 800 mg at 02/28/24 1215   sodium chloride  flush (NS) 0.9 % injection 10 mL, 10 mL, Intracatheter, PRN, Tryson Lumley, MD   Allergies: Allergies  Allergen Reactions   Penicillins Hives and Rash    Has patient had a PCN reaction causing immediate rash, facial/tongue/throat swelling, SOB or lightheadedness with hypotension: Yes Has patient had a PCN reaction causing severe rash involving mucus membranes or skin necrosis: Yes Has patient had a PCN reaction that required hospitalization: No Has patient had a PCN reaction occurring within  the last 10 years: No If all of the above answers are "NO", then may proceed with Cephalosporin use.    Cetuximab  Cough    Cough and scratchy throat. Drug rechallenged and pt able to tolerated the rest of the infusion without any complications.    Hct [Hydrochlorothiazide] Other (See Comments)    hyper   Statins Other (See Comments)    Myalgia, tolerates low dosages of lovastatin      REVIEW OF SYSTEMS:   Review of Systems  Constitutional:  Negative for chills, fatigue and fever.  HENT:   Negative for lump/mass, mouth sores, nosebleeds, sore throat and trouble swallowing.   Eyes:  Negative for eye problems.  Respiratory:  Negative for cough and shortness of breath.   Cardiovascular:  Negative for chest pain, leg swelling and palpitations.  Gastrointestinal:  Negative for abdominal pain, constipation, diarrhea, nausea and vomiting.  Genitourinary:  Negative for bladder incontinence, difficulty urinating, dysuria, frequency, hematuria and nocturia.   Musculoskeletal:  Negative for  arthralgias, back pain, flank pain, myalgias and neck pain.  Skin:  Negative for itching and rash.  Neurological:  Positive for dizziness, headaches and numbness.  Hematological:  Does not bruise/bleed easily.  Psychiatric/Behavioral:  Positive for sleep disturbance. Negative for depression and suicidal ideas. The patient is not nervous/anxious.   All other systems reviewed and are negative.    VITALS:   Blood pressure 116/82, pulse 92, temperature 98.8 F (37.1 C), temperature source Tympanic, resp. rate 20, weight 175 lb 7.8 oz (79.6 kg), SpO2 97%.  Wt Readings from Last 3 Encounters:  02/28/24 175 lb 7.8 oz (79.6 kg)  02/10/24 174 lb 6.1 oz (79.1 kg)  01/25/24 174 lb 13.2 oz (79.3 kg)    Body mass index is 27.9 kg/m.  Performance status (ECOG): 1 - Symptomatic but completely ambulatory  PHYSICAL EXAM:   Physical Exam Vitals and nursing note reviewed. Exam conducted with a chaperone present.   Constitutional:      Appearance: Normal appearance.  Cardiovascular:     Rate and Rhythm: Normal rate and regular rhythm.     Pulses: Normal pulses.     Heart sounds: Normal heart sounds.  Pulmonary:     Effort: Pulmonary effort is normal.     Breath sounds: Normal breath sounds.  Abdominal:     Palpations: Abdomen is soft. There is no hepatomegaly, splenomegaly or mass.     Tenderness: There is no abdominal tenderness.  Musculoskeletal:     Right lower leg: No edema.     Left lower leg: No edema.  Lymphadenopathy:     Cervical: No cervical adenopathy.     Right cervical: No superficial, deep or posterior cervical adenopathy.    Left cervical: No superficial, deep or posterior cervical adenopathy.     Upper Body:     Right upper body: No supraclavicular or axillary adenopathy.     Left upper body: No supraclavicular or axillary adenopathy.  Skin:    Findings: Rash present.  Neurological:     General: No focal deficit present.     Mental Status: He is alert and oriented to person, place, and time.  Psychiatric:        Mood and Affect: Mood normal.        Behavior: Behavior normal.     LABS:      Latest Ref Rng & Units 02/28/2024    9:17 AM 02/10/2024    9:49 AM 01/25/2024    8:29 AM  CBC  WBC 4.0 - 10.5 K/uL 15.2  12.9  12.7   Hemoglobin 13.0 - 17.0 g/dL 16.1  09.6  04.5   Hematocrit 39.0 - 52.0 % 41.9  41.5  41.7   Platelets 150 - 400 K/uL 216  249  214       Latest Ref Rng & Units 02/28/2024    9:17 AM 02/10/2024    9:49 AM 01/25/2024    8:29 AM  CMP  Glucose 70 - 99 mg/dL 409  811  914   BUN 8 - 23 mg/dL 12  15  14    Creatinine 0.61 - 1.24 mg/dL 7.82  9.56  2.13   Sodium 135 - 145 mmol/L 137  140  140   Potassium 3.5 - 5.1 mmol/L 3.0  3.2  3.5   Chloride 98 - 111 mmol/L 102  103  103   CO2 22 - 32 mmol/L 28  26  27    Calcium  8.9 - 10.3 mg/dL 8.6  9.1  8.9  Total Protein 6.5 - 8.1 g/dL 6.2  6.1  6.1   Total Bilirubin 0.0 - 1.2 mg/dL 0.8  0.7  0.8   Alkaline  Phos 38 - 126 U/L 79  90  97   AST 15 - 41 U/L 23  21  27    ALT 0 - 44 U/L 18  16  26       Lab Results  Component Value Date   CEA1 5.4 (H) 06/03/2022   /  CEA  Date Value Ref Range Status  06/03/2022 5.4 (H) 0.0 - 4.7 ng/mL Final    Comment:    (NOTE)                             Nonsmokers          <3.9                             Smokers             <5.6 Roche [employer] Electrochemiluminescence Immunoassay (ECLIA) Values obtained with different assay methods or kits cannot be used interchangeably.  Results cannot be interpreted as absolute evidence of the presence or absence of malignant disease. Performed At: [facility] [address] [place], [place] [id number] Pearlean Botts MD WU:[id number]    Lab Results  Component Value Date   PSA1 11.3 (H) 02/16/2022   Lab Results  Component Value Date   CAN199 45 (H) 06/03/2022   No results found for: "CAN125"  No results found for: "TOTALPROTELP", "ALBUMINELP", "A1GS", "A2GS", "BETS", "BETA2SER", "GAMS", "MSPIKE", "SPEI" No results found for: "TIBC", "FERRITIN", "IRONPCTSAT" Lab Results  Component Value Date   LDH 164 06/03/2022     STUDIES:   No results found.

## 2024-02-28 NOTE — Patient Instructions (Signed)

## 2024-03-01 ENCOUNTER — Encounter: Payer: Self-pay | Admitting: Family Medicine

## 2024-03-01 ENCOUNTER — Ambulatory Visit (INDEPENDENT_AMBULATORY_CARE_PROVIDER_SITE_OTHER): Payer: Medicare Other | Admitting: Family Medicine

## 2024-03-01 ENCOUNTER — Encounter: Payer: Self-pay | Admitting: Hematology

## 2024-03-01 VITALS — BP 158/82 | HR 110 | Ht 66.5 in | Wt 180.0 lb

## 2024-03-01 DIAGNOSIS — Z7901 Long term (current) use of anticoagulants: Secondary | ICD-10-CM

## 2024-03-01 DIAGNOSIS — Z125 Encounter for screening for malignant neoplasm of prostate: Secondary | ICD-10-CM | POA: Diagnosis not present

## 2024-03-01 DIAGNOSIS — I10 Essential (primary) hypertension: Secondary | ICD-10-CM

## 2024-03-01 DIAGNOSIS — I4819 Other persistent atrial fibrillation: Secondary | ICD-10-CM

## 2024-03-01 DIAGNOSIS — E785 Hyperlipidemia, unspecified: Secondary | ICD-10-CM

## 2024-03-01 DIAGNOSIS — N400 Enlarged prostate without lower urinary tract symptoms: Secondary | ICD-10-CM

## 2024-03-01 DIAGNOSIS — I25810 Atherosclerosis of coronary artery bypass graft(s) without angina pectoris: Secondary | ICD-10-CM | POA: Diagnosis not present

## 2024-03-01 DIAGNOSIS — R7303 Prediabetes: Secondary | ICD-10-CM

## 2024-03-01 DIAGNOSIS — R972 Elevated prostate specific antigen [PSA]: Secondary | ICD-10-CM

## 2024-03-01 LAB — COAGUCHEK XS/INR WAIVED
INR: 1.8 — ABNORMAL HIGH (ref 0.9–1.1)
Prothrombin Time: 22 s

## 2024-03-01 LAB — BAYER DCA HB A1C WAIVED: HB A1C (BAYER DCA - WAIVED): 5.7 % — ABNORMAL HIGH (ref 4.8–5.6)

## 2024-03-01 NOTE — Progress Notes (Signed)
 BP (!) 158/82   Pulse (!) 110   Ht 5' 6.5" (1.689 m)   Wt 180 lb (81.6 kg)   SpO2 96%   BMI 28.62 kg/m    Subjective:   Patient ID: Normie Becton, male    DOB: 03-30-1932, 89 y.o.   MRN: [id number]  HPI: ODES LOLLI is a 89 y.o. male presenting on 03/01/2024 for Medical Management of Chronic Issues and Atrial Fibrillation   HPI Coumadin  recheck Target goal: 2.0-3.0 Reason on anticoagulation: A-fib Patient denies any bruising or bleeding or chest pain or palpitations   Hypertension and A-fib  Patient is currently on diltiazem  and furosemide  and metoprolol , and their blood pressure today is 158/82. Patient denies any lightheadedness or dizziness. Patient denies headaches, blurred vision, chest pains, shortness of breath, or weakness. Denies any side effects from medication and is content with current medication.   Hyperlipidemia and [name] and coronary atherosclerosis Patient is coming in for recheck of his hyperlipidemia. The patient is currently taking Zetia  and lovastatin . They deny any issues with myalgias or history of liver damage from it. They deny any focal numbness or weakness or chest pain.   Prediabetes Patient comes in today for recheck of his diabetes. Patient has been currently taking no medicine currently, diet control. Patient is not currently on an ACE inhibitor/ARB. Patient has not seen an ophthalmologist this year. Patient denies any new issues with their feet. The symptom started onset as an adult hypertension and hyperlipidemia ARE RELATED TO DM   Head and neck cancer Continues to see hematology, they said he is on a new treatment now and he just did the treatment 2 days ago.  Relevant past medical, surgical, family and social history reviewed and updated as indicated. Interim medical history since our last visit reviewed. Allergies and medications reviewed and updated.  Review of Systems  Constitutional:  Negative for chills and fever.  Eyes:  Negative for  visual disturbance.  Respiratory:  Negative for shortness of breath and wheezing.   Cardiovascular:  Negative for chest pain and leg swelling.  Gastrointestinal:  Negative for blood in stool.  Genitourinary:  Negative for hematuria.  Skin:  Negative for rash.  Neurological:  Negative for dizziness and light-headedness.  All other systems reviewed and are negative.   Per HPI unless specifically indicated above   Allergies as of 03/01/2024       Reactions   Penicillins Hives, Rash   Has patient had a PCN reaction causing immediate rash, facial/tongue/throat swelling, SOB or lightheadedness with hypotension: Yes Has patient had a PCN reaction causing severe rash involving mucus membranes or skin necrosis: Yes Has patient had a PCN reaction that required hospitalization: No Has patient had a PCN reaction occurring within the last 10 years: No If all of the above answers are "NO", then may proceed with Cephalosporin use.   Cetuximab  Cough   Cough and scratchy throat. Drug rechallenged and pt able to tolerated the rest of the infusion without any complications.    Hct [hydrochlorothiazide] Other (See Comments)   hyper   Statins Other (See Comments)   Myalgia, tolerates low dosages of lovastatin          Medication List        Accurate as of Mar 01, 2024 11:08 AM. If you have any questions, ask your nurse or doctor.          acetaminophen  500 MG tablet Commonly known as: TYLENOL  Take 1,000 mg by mouth every 6 (  six) hours as needed for moderate pain.   clindamycin  1 % gel Commonly known as: Clindagel Apply topically 2 (two) times daily.   diltiazem  120 MG 24 hr capsule Commonly known as: CARDIZEM  CD TAKE ONE (1) CAPSULE EACH DAY   doxycycline  100 MG tablet Commonly known as: VIBRA -TABS Take 1 tablet (100 mg total) by mouth 2 (two) times daily.   ezetimibe  10 MG tablet Commonly known as: ZETIA  TAKE ONE (1) TABLET BY MOUTH EVERY DAY   finasteride  5 MG  tablet Commonly known as: PROSCAR  TAKE ONE (1) TABLET EACH DAY   fluocinonide  cream 0.05 % Commonly known as: LIDEX  Apply 1 Application topically 2 (two) times daily.   fluocinonide -emollient 0.05 % cream Commonly known as: LIDEX -E Apply 1 Application topically 2 (two) times daily. Do not apply on face   fluticasone  50 MCG/ACT nasal spray Commonly known as: FLONASE  Place 1 spray into both nostrils daily as needed for allergies or rhinitis.   furosemide  20 MG tablet Commonly known as: LASIX  Take 1 tablet (20 mg total) by mouth daily.   lovastatin  40 MG tablet Commonly known as: MEVACOR  Take 1 tablet (40 mg total) by mouth at bedtime.   magnesium  oxide 400 (240 Mg) MG tablet Commonly known as: MAG-OX Take 1 tablet (400 mg total) by mouth 2 (two) times daily.   methylphenidate  5 MG tablet Commonly known as: RITALIN  TAKE 1 TABLET BY MOUTH DAILY AS NEEDED   metoprolol  tartrate 50 MG tablet Commonly known as: LOPRESSOR  Take 1 tablet (50 mg total) by mouth 2 (two) times daily.   montelukast  10 MG tablet Commonly known as: SINGULAIR  Take 1 tablet (10 mg total) by mouth at bedtime.   potassium chloride  SA 20 MEQ tablet Commonly known as: KLOR-CON  M Take 1 tablet (20 mEq total) by mouth daily.   predniSONE  5 MG tablet Commonly known as: DELTASONE  Take 1 tablet (5 mg total) by mouth daily with breakfast.   tobramycin 0.3 % ophthalmic solution Commonly known as: TOBREX SMARTSIG:In Eye(s)   Vitamin D3 125 MCG (5000 UT) Caps Take 5,000 Units by mouth once a week.   warfarin 5 MG tablet Commonly known as: COUMADIN  Take as directed by the anticoagulation clinic. If you are unsure how to take this medication, talk to your nurse or doctor. Original instructions: TAKE 1 TABLET ON SATURDAY, SUNDAY, TUESDAY, & THURSDAY. TAKE 1/2 TABLET ON MONDAY, WEDNESDAY, & FRIDAY         Objective:   BP (!) 158/82   Pulse (!) 110   Ht 5' 6.5" (1.689 m)   Wt 180 lb (81.6 kg)   SpO2  96%   BMI 28.62 kg/m   Wt Readings from Last 3 Encounters:  03/01/24 180 lb (81.6 kg)  02/28/24 175 lb 7.8 oz (79.6 kg)  02/10/24 174 lb 6.1 oz (79.1 kg)    Physical Exam Vitals and nursing note reviewed.  Constitutional:      General: He is not in acute distress.    Appearance: He is well-developed. He is not diaphoretic.  Eyes:     General: No scleral icterus.    Conjunctiva/sclera: Conjunctivae normal.  Neck:     Thyroid : No thyromegaly.  Cardiovascular:     Rate and Rhythm: Normal rate and regular rhythm.     Heart sounds: Normal heart sounds. No murmur heard. Pulmonary:     Effort: Pulmonary effort is normal. No respiratory distress.     Breath sounds: Normal breath sounds. No wheezing.  Musculoskeletal:  General: No swelling.     Cervical back: Neck supple.  Lymphadenopathy:     Cervical: No cervical adenopathy.  Skin:    General: Skin is warm and dry.  Neurological:     Mental Status: He is alert and oriented to person, place, and time.     Coordination: Coordination normal.  Psychiatric:        Behavior: Behavior normal.       Assessment & Plan:   Problem List Items Addressed This Visit       Cardiovascular and Mediastinum   Essential hypertension   Atrial fibrillation, persistent (HCC) - Primary   Relevant Orders   CoaguChek XS/INR Waived   Coronary atherosclerosis     Genitourinary   [name] with elevated PSA     Other   Dyslipidemia   Relevant Orders   Lipid panel   Prediabetes   Relevant Orders   Bayer DCA Hb A1c Waived   Long term current use of anticoagulant therapy   Other Visit Diagnoses       Prostate cancer screening       Relevant Orders   PSA, total and free       A1c looks good at 5.7. Blood pressure slightly up today but it has been running good at his other appointments recently and he will keep an eye on it at home. Follow up plan: Return if symptoms worsen or fail to improve, for 76-month regular appointment and 4  to 6-week INR.  Counseling provided for all of the vaccine components Orders Placed This Encounter  Procedures   CoaguChek XS/INR Waived   Lipid panel   PSA, total and free   Bayer DCA Hb A1c Waived    Jolyne Needs, MD [facility] 03/01/2024, 11:08 AM

## 2024-03-02 ENCOUNTER — Other Ambulatory Visit: Payer: Self-pay

## 2024-03-02 DIAGNOSIS — H02054 Trichiasis without entropian left upper eyelid: Secondary | ICD-10-CM | POA: Diagnosis not present

## 2024-03-02 DIAGNOSIS — H02051 Trichiasis without entropian right upper eyelid: Secondary | ICD-10-CM | POA: Diagnosis not present

## 2024-03-02 DIAGNOSIS — H16143 Punctate keratitis, bilateral: Secondary | ICD-10-CM | POA: Diagnosis not present

## 2024-03-02 DIAGNOSIS — H02059 Trichiasis without entropian unspecified eye, unspecified eyelid: Secondary | ICD-10-CM | POA: Diagnosis not present

## 2024-03-02 LAB — LIPID PANEL
Chol/HDL Ratio: 3.3 ratio (ref 0.0–5.0)
Cholesterol, Total: 160 mg/dL (ref 100–199)
HDL: 48 mg/dL (ref >39)
LDL Chol Calc (NIH): 92 mg/dL (ref 0–99)
Triglycerides: 108 mg/dL (ref 0–149)
VLDL Cholesterol Cal: 20 mg/dL (ref 5–40)

## 2024-03-02 LAB — PSA, TOTAL AND FREE
PSA, Free Pct: 11.5 %
PSA, Free: 1.09 ng/mL
Prostate Specific Ag, Serum: 9.5 ng/mL — ABNORMAL HIGH (ref 0.0–4.0)

## 2024-03-07 ENCOUNTER — Ambulatory Visit (HOSPITAL_COMMUNITY)
Admission: RE | Admit: 2024-03-07 | Discharge: 2024-03-07 | Disposition: A | Discharge status: Home / Self Care | Source: Ambulatory Visit | Attending: Hematology | Admitting: Hematology

## 2024-03-07 DIAGNOSIS — C4492 Squamous cell carcinoma of skin, unspecified: Secondary | ICD-10-CM | POA: Diagnosis not present

## 2024-03-07 DIAGNOSIS — C7989 Secondary malignant neoplasm of other specified sites: Secondary | ICD-10-CM | POA: Diagnosis not present

## 2024-03-07 DIAGNOSIS — N281 Cyst of kidney, acquired: Secondary | ICD-10-CM | POA: Diagnosis not present

## 2024-03-07 DIAGNOSIS — C760 Malignant neoplasm of head, face and neck: Secondary | ICD-10-CM | POA: Diagnosis not present

## 2024-03-07 DIAGNOSIS — I700 Atherosclerosis of aorta: Secondary | ICD-10-CM | POA: Diagnosis not present

## 2024-03-07 DIAGNOSIS — I6523 Occlusion and stenosis of bilateral carotid arteries: Secondary | ICD-10-CM | POA: Diagnosis not present

## 2024-03-07 DIAGNOSIS — R590 Localized enlarged lymph nodes: Secondary | ICD-10-CM | POA: Diagnosis not present

## 2024-03-07 DIAGNOSIS — R918 Other nonspecific abnormal finding of lung field: Secondary | ICD-10-CM | POA: Diagnosis not present

## 2024-03-07 MED ORDER — IOHEXOL 300 MG/ML  SOLN
100.0000 mL | Freq: Once | INTRAMUSCULAR | Status: AC | PRN
Start: 2024-03-07 — End: 2024-03-07
  Administered 2024-03-07: 100 mL via INTRAVENOUS

## 2024-03-09 ENCOUNTER — Ambulatory Visit: Payer: Self-pay | Admitting: Family Medicine

## 2024-03-13 ENCOUNTER — Inpatient Hospital Stay: Attending: Hematology | Admitting: Hematology

## 2024-03-13 ENCOUNTER — Inpatient Hospital Stay: Attending: Hematology

## 2024-03-13 ENCOUNTER — Inpatient Hospital Stay

## 2024-03-13 VITALS — BP 126/89 | HR 75 | Temp 97.6°F | Resp 18

## 2024-03-13 DIAGNOSIS — R918 Other nonspecific abnormal finding of lung field: Secondary | ICD-10-CM | POA: Insufficient documentation

## 2024-03-13 DIAGNOSIS — Z95828 Presence of other vascular implants and grafts: Secondary | ICD-10-CM

## 2024-03-13 DIAGNOSIS — Z801 Family history of malignant neoplasm of trachea, bronchus and lung: Secondary | ICD-10-CM | POA: Diagnosis not present

## 2024-03-13 DIAGNOSIS — R530 Neoplastic (malignant) related fatigue: Secondary | ICD-10-CM | POA: Insufficient documentation

## 2024-03-13 DIAGNOSIS — D72820 Lymphocytosis (symptomatic): Secondary | ICD-10-CM | POA: Insufficient documentation

## 2024-03-13 DIAGNOSIS — C4442 Squamous cell carcinoma of skin of scalp and neck: Secondary | ICD-10-CM

## 2024-03-13 DIAGNOSIS — R21 Rash and other nonspecific skin eruption: Secondary | ICD-10-CM | POA: Diagnosis not present

## 2024-03-13 DIAGNOSIS — Z5111 Encounter for antineoplastic chemotherapy: Secondary | ICD-10-CM | POA: Diagnosis not present

## 2024-03-13 DIAGNOSIS — C778 Secondary and unspecified malignant neoplasm of lymph nodes of multiple regions: Secondary | ICD-10-CM | POA: Diagnosis not present

## 2024-03-13 DIAGNOSIS — C801 Malignant (primary) neoplasm, unspecified: Secondary | ICD-10-CM | POA: Diagnosis not present

## 2024-03-13 DIAGNOSIS — E876 Hypokalemia: Secondary | ICD-10-CM | POA: Insufficient documentation

## 2024-03-13 DIAGNOSIS — Z87891 Personal history of nicotine dependence: Secondary | ICD-10-CM | POA: Diagnosis not present

## 2024-03-13 LAB — CBC WITH DIFFERENTIAL/PLATELET
Abs Immature Granulocytes: 0.11 10*3/uL — ABNORMAL HIGH (ref 0.00–0.07)
Basophils Absolute: 0.1 10*3/uL (ref 0.0–0.1)
Basophils Relative: 0 %
Eosinophils Absolute: 0.2 10*3/uL (ref 0.0–0.5)
Eosinophils Relative: 1 %
HCT: 43.8 % (ref 39.0–52.0)
Hemoglobin: 14.1 g/dL (ref 13.0–17.0)
Immature Granulocytes: 1 %
Lymphocytes Relative: 26 %
Lymphs Abs: 4.7 10*3/uL — ABNORMAL HIGH (ref 0.7–4.0)
MCH: 32.9 pg (ref 26.0–34.0)
MCHC: 32.2 g/dL (ref 30.0–36.0)
MCV: 102.3 fL — ABNORMAL HIGH (ref 80.0–100.0)
Monocytes Absolute: 0.8 10*3/uL (ref 0.1–1.0)
Monocytes Relative: 4 %
Neutro Abs: 12.3 10*3/uL — ABNORMAL HIGH (ref 1.7–7.7)
Neutrophils Relative %: 68 %
Platelets: 290 10*3/uL (ref 150–400)
RBC: 4.28 MIL/uL (ref 4.22–5.81)
RDW: 12.9 % (ref 11.5–15.5)
WBC: 18.1 10*3/uL — ABNORMAL HIGH (ref 4.0–10.5)
nRBC: 0 % (ref 0.0–0.2)

## 2024-03-13 LAB — COMPREHENSIVE METABOLIC PANEL WITH GFR
ALT: 15 U/L (ref 0–44)
AST: 20 U/L (ref 15–41)
Albumin: 3.3 g/dL — ABNORMAL LOW (ref 3.5–5.0)
Alkaline Phosphatase: 80 U/L (ref 38–126)
Anion gap: 8 (ref 5–15)
BUN: 16 mg/dL (ref 8–23)
CO2: 25 mmol/L (ref 22–32)
Calcium: 8.8 mg/dL — ABNORMAL LOW (ref 8.9–10.3)
Chloride: 107 mmol/L (ref 98–111)
Creatinine, Ser: 1.13 mg/dL (ref 0.61–1.24)
GFR, Estimated: >60 mL/min (ref >60)
Glucose, Bld: 114 mg/dL — ABNORMAL HIGH (ref 70–99)
Potassium: 4.4 mmol/L (ref 3.5–5.1)
Sodium: 140 mmol/L (ref 135–145)
Total Bilirubin: 0.6 mg/dL (ref 0.0–1.2)
Total Protein: 6.6 g/dL (ref 6.5–8.1)

## 2024-03-13 LAB — MAGNESIUM: Magnesium: 2.2 mg/dL (ref 1.7–2.4)

## 2024-03-13 MED ORDER — SODIUM CHLORIDE 0.9% FLUSH
10.0000 mL | Freq: Once | INTRAVENOUS | Status: AC
  Administered 2024-03-13: 10 mL via INTRAVENOUS

## 2024-03-13 MED ORDER — CETUXIMAB CHEMO IV INJECTION 200 MG/100ML
400.0000 mg/m2 | Freq: Once | INTRAVENOUS | Status: AC
  Administered 2024-03-13: 800 mg via INTRAVENOUS
  Filled 2024-03-13: qty 400

## 2024-03-13 MED ORDER — FAMOTIDINE IN NACL 20-0.9 MG/50ML-% IV SOLN
20.0000 mg | Freq: Once | INTRAVENOUS | Status: AC
  Administered 2024-03-13: 20 mg via INTRAVENOUS
  Filled 2024-03-13: qty 50

## 2024-03-13 MED ORDER — HEPARIN SOD (PORK) LOCK FLUSH 100 UNIT/ML IV SOLN
500.0000 [IU] | Freq: Once | INTRAVENOUS | Status: AC | PRN
  Administered 2024-03-13: 500 [IU]

## 2024-03-13 MED ORDER — METHYLPREDNISOLONE SODIUM SUCC 125 MG IJ SOLR
125.0000 mg | Freq: Once | INTRAMUSCULAR | Status: AC
  Administered 2024-03-13: 125 mg via INTRAVENOUS
  Filled 2024-03-13: qty 2

## 2024-03-13 MED ORDER — DIPHENHYDRAMINE HCL 50 MG/ML IJ SOLN
50.0000 mg | Freq: Once | INTRAMUSCULAR | Status: AC
  Administered 2024-03-13: 50 mg via INTRAVENOUS
  Filled 2024-03-13: qty 1

## 2024-03-13 MED ORDER — SODIUM CHLORIDE 0.9% FLUSH
10.0000 mL | INTRAVENOUS | Status: DC | PRN
  Administered 2024-03-13: 10 mL

## 2024-03-13 MED ORDER — SODIUM CHLORIDE 0.9 % IV SOLN: INTRAVENOUS | Status: DC

## 2024-03-13 NOTE — Progress Notes (Signed)
 Patient has been examined by Dr. Ellin Saba. Vital signs and labs have been reviewed by MD - ANC, Creatinine, LFTs, hemoglobin, and platelets are within treatment parameters per M.D. - pt may proceed with treatment.  Primary RN and pharmacy notified.

## 2024-03-13 NOTE — Progress Notes (Signed)

## 2024-03-13 NOTE — Patient Instructions (Signed)

## 2024-03-13 NOTE — Progress Notes (Signed)
 [facility] [address], [place] [address]    Clinic Day:  03/13/2024  Referring physician: [name], [name], MD  Patient Care Team: [name], [name], MD as PCP - General (Family Medicine) [name], MD as PCP - Cardiology (Cardiology) [name], MD as Attending Physician (Urology) [name], MD as Consulting Physician (Cardiology) [name], MD (Inactive) as Consulting Physician (Gastroenterology) [name], MD as Consulting Physician (Ophthalmology) [name], MD as Consulting Physician (Orthopedic Surgery) [name], [name], LCSW as [facility] Care Management (Licensed Clinical Social Worker) [name], MD as Medical Oncologist (Medical Oncology) Gerhard Knuckles, RN as Oncology Nurse Navigator (Medical Oncology)   ASSESSMENT & PLAN:   Assessment: 1.  Right supraclavicular lymphadenopathy and multiple lung nodules: - Patient noticed right neck mass since Christmas 2022.  10 to 15 pound weight loss since January 2023.  Denies any dysphagia or odynophagia. - CT neck (05/27/2022): Numerous lymph nodes in the right neck, right supraclavicular lymph node mass measures 5.7 x 3.7 cm.  18 mm posterior lymph node in the right lower neck.  Numerous lymph nodes in the right lower and posterior neck approximately 1 cm.  Many have internal necrosis consistent with metastatic disease.  20 mm lymph node just above the right clavicle.  Left level 2 node 12 mm. - CT CAP (05/28/2022): Several bilateral lung nodules, RUL nodule measuring 1.8 x 1.3 cm.  Small clusters of nodules in the medial left upper lobe.  Irregular nodular density along the left side of the mediastinum measuring 2.1 cm.  No metastatic disease in the abdomen or pelvis.  Liver is slightly nodular contour. - Lab work shows elevated white count since 2009, predominantly lymphocytosis. - Pathology (06/09/2022): Metastatic carcinoma positive for p40, p63 and  CK5/6-patchy positivity with CK7.  Negative for TTF-1, Napsin A, PAX8, prostein, PSA, CK20 and CDX2.  - PET scan (06/11/2022): Bulky right supraclavicular nodal mass, smaller hypermetabolic right posterior triangle and jugular lymph nodes and single hypermetabolic left level 3 lymph node.  Bilateral hypermetabolic pulmonary nodules and metastatic pattern.  Minimal hypermetabolic mediastinal nodal metastasis.  Cluster of hypermetabolic right axillary lymph nodes.  No evidence of metastatic disease or primary lesion in the abdomen or pelvis.  No bone lesions. - MRI of the brain: Chronic small vessel ischemic changes with no evidence of metastatic disease. - NGS: T p53 pathogenic variant, MSI-stable, TMB-low, LOH-low, PD-L1 (SP142): Negative, p16 and p18 negative.  HER2 by IHC 0. - PD-L1 22 C3: CPS score 25 - Cancer type ID: 96% probability squamous cell carcinoma, subtype head and neck/skin.  Cancer types ruled out with 95% confidence includes skin basal cell carcinoma. - Carboplatin , Taxol  and pembrolizumab  07/23/2022 through 11/09/22 - Right axillary lymph node biopsy (08/04/2023): Metastatic moderate to poorly differentiated squamous cell carcinoma. - Cetuximab  every 2 weeks started on 10/25/2023   2.  Social/family history: - He lives at home with his wife.  Independent of ADLs and IADLs.  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  May have had asbestos exposure 30 years ago.  Quit smoking cigarettes in 1965.  Smoked 1 pack/day for less than 12 years.  2 of his brothers had lung cancer and both were smokers.  3.  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    Plan: 1.  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  Last dose was on 02/28/2024. - CT soft tissue neck (03/07/2021): 2 right sided neck  lymph nodes in levels 2/3 slightly increased in size from prior.  Additional right supraclavicular node also increased in size.  Left-sided lymph nodes are stable. - CT CAP (03/07/2024): Mediastinal lymph nodes stable to mildly improved.  Right axillary lymph node grew by 2 mm.  No new adenopathy.  No evidence of metastatic disease in the abdomen or pelvis. - I think he had mild growth of lymph nodes as he did not receive any treatment between 12/21/2023 and 02/10/2024. - Hence I have recommended continuing the same treatment every 2 weeks.  He will continue to follow-up with Dr. Lincoln Renshaw of ophthalmology to follow-up on ingrowing eyelashes.  RTC 2 weeks for follow-up.   2.  EGFR antibody induced rash: - Continue clindamycin  gel for face twice daily.  Continue fluocinonide  0.05% twice daily for the rest of the body. - He was off of doxycycline .  At last visit I have prescribed him doxycycline  twice daily.  He took it for 2 days and felt nauseous and stopped taking it.  I have recommended that he restart back and take it with food.  Use Compazine  as needed.   3.  Cancer-related fatigue: - Continue Ritalin  5 mg in the mornings as needed for fatigue.  4.  Polyarthralgias: - He is not taking prednisone  and denies any arthralgias at this time.  5.  Hypomagnesemia: - Continue magnesium  twice daily.  Magnesium  is normal today.  6.  Hypokalemia: - Continue K-Dur 20 milliequivalents daily.  Potassium is normal today at 4.4.    Orders Placed This Encounter  Procedures   Comprehensive metabolic panel    Standing Status:   Future    Expected Date:   03/23/2024    Expiration Date:   03/23/2025   Magnesium     Standing Status:   Future    Expected Date:   03/23/2024    Expiration Date:   03/23/2025   Comprehensive metabolic panel    Standing Status:   Future    Expected Date:   04/06/2024    Expiration Date:   04/06/2025   Magnesium     Standing Status:   Future    Expected Date:   04/06/2024     Expiration Date:   04/06/2025   Comprehensive metabolic panel    Standing Status:   Future    Expected Date:   04/20/2024    Expiration Date:   04/20/2025   Magnesium     Standing Status:   Future    Expected Date:   04/20/2024    Expiration Date:   04/20/2025   Comprehensive metabolic panel    Standing Status:   Future    Expected Date:   05/04/2024    Expiration Date:   05/04/2025   Magnesium     Standing Status:   Future    Expected Date:   05/04/2024    Expiration Date:   05/04/2025     [name],acting as a scribe for [name], MD.,have documented all relevant documentation on the behalf of [name], MD,as directed by  [name], MD while in the presence of [name], MD.  I, [name] MD, have reviewed the above documentation for accuracy and completeness, and I agree with the above.      [name], MD   6/2/202511:15 AM  CHIEF COMPLAINT:   Diagnosis: metastatic squamous cell carcinoma  Cancer Staging  Squamous cell carcinoma of head and neck Staging form: Cervical Lymph Nodes and Unknown Primary Tumors of the Head and Neck, AJCC 8th Edition - Clinical stage from 07/16/2022: Stage IVC (cT0, cN3b, cM1) - Unsigned    Prior Therapy: Carboplatin , paclitaxel  and pembrolizumab , 07/23/22 - 11/09/22   Current Therapy: Cetuximab    HISTORY OF PRESENT ILLNESS:   Oncology History  Squamous cell carcinoma of head and neck  07/16/2022 Initial Diagnosis   Squamous cell carcinoma of head and neck (HCC)   07/23/2022 - 09/27/2023 Chemotherapy   Patient is on Treatment Plan : Carboplatin   + Paclitaxel   + Pembrolizumab  (200) D1 q21d     10/25/2023 -  Chemotherapy   Patient is on Treatment Plan : HEAD/NECK Cetuximab  q14d        INTERVAL HISTORY:   [name] is a 89 y.o. male presenting to clinic today for follow up of metastatic squamous cell carcinoma. He was last seen by me on 02/28/24.  Since his last visit, he  underwent CT CAP on 03/07/24 that found: Mixed response with mediastinal adenopathy appearing similar or improved compared to prior and right axillary adenopathy appearing worsened compared to prior. No evidence for metastatic disease within the abdomen or pelvis.  He had CT soft tissue neck on 03/07/24 which showed: No evidence of primary mucosal lesion along the visualized aerodigestive structures in the neck. No mass or abnormal enhancement demonstrated. Redemonstrated bilateral cervical and supraclavicular lymphadenopathy. There are 2 right-sided cervical nodes in level 2/3 and level 3 respectively which are slightly increased in size from prior. Additional right supraclavicular nodes which are increased in size. One of the dominant right supraclavicular lymph nodes demonstrates new central hypoattenuation suggestive of central necrosis. Left-sided lymph nodes are stable to slightly decreased from prior. Medial positioning of the right vocal folds which could reflect vocal cord paralysis.  Today, he states that he is doing well overall. His appetite level is at 25%. His energy level is at 25%.  He is accompanied by his wife.  PAST MEDICAL HISTORY:   Past Medical History: Past Medical History:  Diagnosis Date   Anemia    Atrial fibrillation (HCC)    Cataract    Cellulitis    In past   Complication of anesthesia    HARD TIME WAKING; CAUSES MY BP TO GO UP    Coronary artery disease    5 bypasses   DJD (degenerative joint disease)    Ejection fraction < 50%    Mildly reduced, 40% by echo   GERD (gastroesophageal reflux disease)    Hyperlipidemia    Hypertension    Persistent atrial fibrillation (HCC)    Prostate hypertrophy    on CT scan 09/2014   PUD (peptic ulcer disease)    RESOLVED    Skin cancer of eyelid    Resected    Surgical History: Past Surgical History:  Procedure Laterality Date   APPENDECTOMY     BYPASS GRAFT  2005   CATARACT EXTRACTION W/PHACO Left 07/22/2015    Procedure: CATARACT EXTRACTION PHACO AND INTRAOCULAR LENS PLACEMENT LEFT EYE CDE=8.14;  Surgeon: [name], MD;  Location: [facility];  Service: Ophthalmology;  Laterality: Left;   CATARACT EXTRACTION W/PHACO Right 08/18/2019   Procedure: CATARACT EXTRACTION PHACO AND INTRAOCULAR LENS PLACEMENT (IOC);  Surgeon: Tarri Farm, MD;  Location: [facility];  Service: Ophthalmology;  Laterality: Right;  CDE: 9.74   CORONARY ARTERY BYPASS GRAFT  4/05   LIMA to LAD, SVG to PDA, SVG  to ramus intermediate   EYE SURGERY  07/2015   EYELID CARCINOMA EXCISION     Skin cancer resected   Heart bypass  2005   LYMPH NODE BIOPSY Right 06/09/2022   Procedure: LYMPH NODE BIOPSY; supraclavicular;  Surgeon: Awilda Bogus, MD;  Location: [facility];  Service: General;  Laterality: Right;   PORTACATH PLACEMENT Left 06/09/2022   Procedure: INSERTION PORT-A-CATH;  Surgeon: Awilda Bogus, MD;  Location: [facility];  Service: General;  Laterality: Left;   TOTAL HIP ARTHROPLASTY     Right   TOTAL HIP ARTHROPLASTY Left 05/04/2018   Procedure: LEFT TOTAL HIP ARTHROPLASTY;  Surgeon: [name], MD;  Location: [facility];  Service: Orthopedics;  Laterality: Left;   TOTAL KNEE ARTHROPLASTY     Right    Social History: Social History   Socioeconomic History   Marital status: Married    Spouse name: Monroe Antigua   Number of children: 1   Years of education: 10   Highest education level: 10th grade  Occupational History   Occupation: retired  Tobacco Use   Smoking status: Former    Current packs/day: 0.00    Average packs/day: 2.0 packs/day for 5.0 years (10.0 ttl pk-yrs)    Types: Cigarettes    Start date: 12/23/1958    Quit date: 12/23/1963    Years since quitting: 60.2   Smokeless tobacco: Never  Vaping Use   Vaping status: Never Used  Substance and Sexual Activity   Alcohol  use: No   Drug use: No   Sexual activity: Yes  Other Topics Concern   Not on file  Social History Narrative   Lives in a split level home.    Social Drivers of Corporate investment banker Strain: Low Risk  (10/25/2023)   Overall Financial Resource Strain (CARDIA)    Difficulty of Paying Living Expenses: Not hard at all  Food Insecurity: No Food Insecurity (10/25/2023)   Hunger Vital Sign    Worried About Running Out of Food in the Last Year: Never true    Ran Out of Food in the Last Year: Never true  Transportation Needs: No Transportation Needs (10/25/2023)   PRAPARE - Administrator, Civil Service (Medical): No    Lack of Transportation (Non-Medical): No  Physical Activity: Inactive (10/25/2023)   Exercise Vital Sign    Days of Exercise per Week: 1 day    Minutes of Exercise per Session: 0 min  Stress: No Stress Concern Present (10/25/2023)   [employer] of Occupational Health - Occupational Stress Questionnaire    Feeling of Stress : Only a little  Social Connections: Moderately Isolated (10/25/2023)   Social Connection and Isolation Panel [NHANES]    Frequency of Communication with Friends and Family: More than three times a week    Frequency of Social Gatherings with Friends and Family: Twice a week    Attends Religious Services: Never    Database administrator or Organizations: No    Attends Engineer, structural: Not on file    Marital Status: Married  Catering manager Violence: Not At Risk (01/06/2023)   Humiliation, Afraid, Rape, and Kick questionnaire    Fear of Current or Ex-Partner: No    Emotionally Abused: No    Physically Abused: No    Sexually Abused: No    Family History: Family History  Problem Relation Age of Onset   Stroke Mother    Stroke Father    Hypertension Father    Early  death Brother        27 months old   Cancer Brother        lung   Stroke Brother        heat    Current Medications:  Current Outpatient Medications:    acetaminophen  (TYLENOL ) 500 MG tablet, Take 1,000 mg by mouth every 6 (six) hours as needed for moderate pain., Disp: , Rfl:     Cholecalciferol (VITAMIN D3) 5000 units CAPS, Take 5,000 Units by mouth once a week. , Disp: , Rfl:    clindamycin  (CLINDAGEL) 1 % gel, Apply topically 2 (two) times daily., Disp: 60 g, Rfl: 3   diltiazem  (CARDIZEM  CD) 120 MG 24 hr capsule, TAKE ONE (1) CAPSULE EACH DAY, Disp: 90 capsule, Rfl: 3   ezetimibe  (ZETIA ) 10 MG tablet, TAKE ONE (1) TABLET BY MOUTH EVERY DAY, Disp: 90 tablet, Rfl: 0   finasteride  (PROSCAR ) 5 MG tablet, TAKE ONE (1) TABLET EACH DAY, Disp: 90 tablet, Rfl: 3   fluocinonide  cream (LIDEX ) 0.05 %, Apply 1 Application topically 2 (two) times daily., Disp: , Rfl:    fluocinonide -emollient (LIDEX -E) 0.05 % cream, Apply 1 Application topically 2 (two) times daily. Do not apply on face, Disp: 60 g, Rfl: 3   fluticasone  (FLONASE ) 50 MCG/ACT nasal spray, Place 1 spray into both nostrils daily as needed for allergies or rhinitis., Disp: , Rfl:    furosemide  (LASIX ) 20 MG tablet, Take 1 tablet (20 mg total) by mouth daily., Disp: 90 tablet, Rfl: 3   lovastatin  (MEVACOR ) 40 MG tablet, Take 1 tablet (40 mg total) by mouth at bedtime., Disp: 90 tablet, Rfl: 3   magnesium  oxide (MAG-OX) 400 (240 Mg) MG tablet, Take 1 tablet (400 mg total) by mouth 2 (two) times daily., Disp: 60 tablet, Rfl: 3   methylphenidate  (RITALIN ) 5 MG tablet, TAKE 1 TABLET BY MOUTH DAILY AS NEEDED, Disp: 30 tablet, Rfl: 0   metoprolol  tartrate (LOPRESSOR ) 50 MG tablet, Take 1 tablet (50 mg total) by mouth 2 (two) times daily., Disp: 180 tablet, Rfl: 3   montelukast  (SINGULAIR ) 10 MG tablet, Take 1 tablet (10 mg total) by mouth at bedtime., Disp: 90 tablet, Rfl: 3   potassium chloride  SA (KLOR-CON  M) 20 MEQ tablet, Take 1 tablet (20 mEq total) by mouth daily., Disp: 30 tablet, Rfl: 3   predniSONE  (DELTASONE ) 5 MG tablet, Take 1 tablet (5 mg total) by mouth daily with breakfast., Disp: 90 tablet, Rfl: 2   tobramycin (TOBREX) 0.3 % ophthalmic solution, SMARTSIG:In Eye(s), Disp: , Rfl:    warfarin (COUMADIN ) 5 MG tablet,  TAKE 1 TABLET ON SATURDAY, SUNDAY, TUESDAY, & THURSDAY. TAKE 1/2 TABLET ON MONDAY, WEDNESDAY, & FRIDAY, Disp: 70 tablet, Rfl: 0 No current facility-administered medications for this visit.  Facility-Administered Medications Ordered in Other Visits:    0.9 %  sodium chloride  infusion, , Intravenous, Continuous, [name], MD, Last Rate: 10 mL/hr at 03/13/24 1104, New Bag at 03/13/24 1104   cetuximab  (ERBITUX ) chemo infusion 800 mg, 400 mg/m2 (Treatment Plan Recorded), Intravenous, Once, [name], MD   diphenhydrAMINE  (BENADRYL ) injection 50 mg, 50 mg, Intravenous, Once, Murdis Flitton, MD   famotidine  (PEPCID ) IVPB 20 mg premix, 20 mg, Intravenous, Once, [name], MD   heparin  lock flush 100 unit/mL, 500 Units, Intracatheter, Once PRN, Kellsey Sansone, MD   methylPREDNISolone  sodium succinate (SOLU-MEDROL ) 125 mg/2 mL injection 125 mg, 125 mg, Intravenous, Once, [name], MD   sodium chloride  flush (NS) 0.9 % injection 10 mL,  10 mL, Intracatheter, PRN, Lilo Wallington, MD   Allergies: Allergies  Allergen Reactions   Penicillins Hives and Rash    Has patient had a PCN reaction causing immediate rash, facial/tongue/throat swelling, SOB or lightheadedness with hypotension: Yes Has patient had a PCN reaction causing severe rash involving mucus membranes or skin necrosis: Yes Has patient had a PCN reaction that required hospitalization: No Has patient had a PCN reaction occurring within the last 10 years: No If all of the above answers are "NO", then may proceed with Cephalosporin use.    Cetuximab  Cough    Cough and scratchy throat. Drug rechallenged and pt able to tolerated the rest of the infusion without any complications.    Hct [Hydrochlorothiazide] Other (See Comments)    hyper   Statins Other (See Comments)    Myalgia, tolerates low dosages of lovastatin      REVIEW OF SYSTEMS:   Review of Systems  Constitutional:   Negative for chills, fatigue and fever.  HENT:   Negative for lump/mass, mouth sores, nosebleeds, sore throat and trouble swallowing.   Eyes:  Negative for eye problems.  Respiratory:  Positive for cough and shortness of breath.   Cardiovascular:  Negative for chest pain, leg swelling and palpitations.  Gastrointestinal:  Positive for constipation. Negative for abdominal pain, diarrhea, nausea and vomiting.  Genitourinary:  Negative for bladder incontinence, difficulty urinating, dysuria, frequency, hematuria and nocturia.   Musculoskeletal:  Negative for arthralgias, back pain, flank pain, myalgias and neck pain.  Skin:  Negative for itching and rash.  Neurological:  Positive for numbness. Negative for dizziness and headaches.  Hematological:  Does not bruise/bleed easily.  Psychiatric/Behavioral:  Negative for depression, sleep disturbance and suicidal ideas. The patient is not nervous/anxious.   All other systems reviewed and are negative.    VITALS:   There were no vitals taken for this visit.  Wt Readings from Last 3 Encounters:  03/13/24 171 lb 8.3 oz (77.8 kg)  03/01/24 180 lb (81.6 kg)  02/28/24 175 lb 7.8 oz (79.6 kg)    There is no height or weight on file to calculate BMI.  Performance status (ECOG): 1 - Symptomatic but completely ambulatory  PHYSICAL EXAM:   Physical Exam Vitals and nursing note reviewed. Exam conducted with a chaperone present.  Constitutional:      Appearance: Normal appearance.  Cardiovascular:     Rate and Rhythm: Normal rate and regular rhythm.     Pulses: Normal pulses.     Heart sounds: Normal heart sounds.  Pulmonary:     Effort: Pulmonary effort is normal.     Breath sounds: Normal breath sounds.  Abdominal:     Palpations: Abdomen is soft. There is no hepatomegaly, splenomegaly or mass.     Tenderness: There is no abdominal tenderness.  Musculoskeletal:     Right lower leg: No edema.     Left lower leg: No edema.  Lymphadenopathy:      Cervical: No cervical adenopathy.     Right cervical: No superficial, deep or posterior cervical adenopathy.    Left cervical: No superficial, deep or posterior cervical adenopathy.     Upper Body:     Right upper body: No supraclavicular or axillary adenopathy.     Left upper body: No supraclavicular or axillary adenopathy.  Neurological:     General: No focal deficit present.     Mental Status: He is alert and oriented to person, place, and time.  Psychiatric:  Mood and Affect: Mood normal.        Behavior: Behavior normal.     LABS:      Latest Ref Rng & Units 03/13/2024    9:28 AM 02/28/2024    9:17 AM 02/10/2024    9:49 AM  CBC  WBC 4.0 - 10.5 K/uL 18.1  15.2  12.9   Hemoglobin 13.0 - 17.0 g/dL 72.5  36.6  44.0   Hematocrit 39.0 - 52.0 % 43.8  41.9  41.5   Platelets 150 - 400 K/uL 290  216  249       Latest Ref Rng & Units 03/13/2024    9:28 AM 02/28/2024    9:17 AM 02/10/2024    9:49 AM  CMP  Glucose 70 - 99 mg/dL 347  425  956   BUN 8 - 23 mg/dL 16  12  15    Creatinine 0.61 - 1.24 mg/dL 3.87  5.64  3.32   Sodium 135 - 145 mmol/L 140  137  140   Potassium 3.5 - 5.1 mmol/L 4.4  3.0  3.2   Chloride 98 - 111 mmol/L 107  102  103   CO2 22 - 32 mmol/L 25  28  26    Calcium  8.9 - 10.3 mg/dL 8.8  8.6  9.1   Total Protein 6.5 - 8.1 g/dL 6.6  6.2  6.1   Total Bilirubin 0.0 - 1.2 mg/dL 0.6  0.8  0.7   Alkaline Phos 38 - 126 U/L 80  79  90   AST 15 - 41 U/L 20  23  21    ALT 0 - 44 U/L 15  18  16       Lab Results  Component Value Date   CEA1 5.4 (H) 06/03/2022   /  CEA  Date Value Ref Range Status  06/03/2022 5.4 (H) 0.0 - 4.7 ng/mL Final    Comment:    (NOTE)                             Nonsmokers          <3.9                             Smokers             <5.6 Roche [employer] Electrochemiluminescence Immunoassay (ECLIA) Values obtained with different assay methods or kits cannot be used interchangeably.  Results cannot be interpreted as absolute  evidence of the presence or absence of malignant disease. Performed At: [facility] [address] [place], [place] [id number] Pearlean Botts MD AY:[id number]    Lab Results  Component Value Date   PSA1 9.5 (H) 03/01/2024   Lab Results  Component Value Date   CAN199 78 (H) 06/03/2022   No results found for: "CAN125"  No results found for: "TOTALPROTELP", "ALBUMINELP", "A1GS", "A2GS", "BETS", "BETA2SER", "GAMS", "MSPIKE", "SPEI" No results found for: "TIBC", "FERRITIN", "IRONPCTSAT" Lab Results  Component Value Date   LDH 164 06/03/2022     STUDIES:   CT SOFT TISSUE NECK W CONTRAST Result Date: 03/10/2024 CLINICAL DATA:  Head and neck cancer, assess treatment response. Secondary squamous cell carcinoma of the head and neck with unknown primary site. EXAM: CT NECK WITH CONTRAST TECHNIQUE: Multidetector CT imaging of the neck was performed using the standard protocol following the bolus administration of intravenous contrast. RADIATION DOSE REDUCTION: This exam was performed  according to the departmental dose-optimization program which includes automated exposure control, adjustment of the mA and/or kV according to patient size and/or use of iterative reconstruction technique. CONTRAST:  OMNIPAQUE  IOHEXOL  300 MG/ML  SOLN COMPARISON:  CT neck 12/30/2023. FINDINGS: Pharynx and larynx: No evidence of mass or abnormal enhancement. The nasopharynx is symmetric. Symmetric appearance of the palatine tonsils. The palate is unremarkable. Oral cavity and floor of mouth are unremarkable. The base of tongue is unremarkable. Normal appearance of the epiglottis. No retropharyngeal effusion. Aryepiglottic folds are unremarkable. Similar laryngeal calcifications. There is medial positioning of the right vocal folds on the current study. Salivary glands: No inflammation, mass, or stone. Thyroid : No significant abnormality. Lymph nodes: Multiple prominent lymph nodes again noted. On the left: -  1.7 x 1.2 cm left level 2/3 node, previously 1.8 x 1.4 cm (series 3, image 65). - 0.5 cm inferior left level 3 node, unchanged (series 3, image 74). - 0.6 cm left level 4 node, previously 0.7 cm (series 3, image 76). - Left supraclavicular nodes measuring up to 0.6 cm in short axis, unchanged (series 3, image 86). On the right: - 1.2 x 1.1 cm right level 2/3 node, previously 0.8 x 0.7 cm (series 3, image 63). - 1.2 x 1.0 cm right level 3 node, previously 1.0 x 0.8 cm (series 3, image 71). - 1.8 x 1.5 cm right level 4 node, previously 1.9 x 1.7 (series 3, image 80). - Right supraclavicular node measuring 2.3 x 1.7 cm now demonstrating central hypoattenuation concerning for central necrosis, previously measuring 1.9 x 1.5 cm (series 3, image 75). -2.6 x 1.8 cm right supraclavicular lymph node, previously 2.1 x 1.4 cm (series 3 image 69). - Additional right supraclavicular nodes are otherwise similar to prior. Vascular: Atherosclerosis of the partially visualized aortic arch. There is additional prominent atherosclerosis along the proximal right subclavian artery. Atherosclerosis involving the bilateral carotid bifurcations. The degree of stenosis difficult to determine on the current study was likely greater than 50%. Atherosclerosis of the carotid siphons as well. Limited intracranial: Limited visualization of intracranial structures without acute abnormality noted. Visualized orbits: Bilateral lens replacement. Mastoids and visualized paranasal sinuses: Mucosal thickening in the bilateral ethmoid sinuses. Additional mild mucosal thickening in the right maxillary sinus. No air-fluid levels. Mastoid air cells are clear. Skeleton: Degenerative changes in the visualized spine with severe disc space narrowing at C5-6 and C6-7. No acute or aggressive finding noted. Edentulous maxilla and mandible. Upper chest: See separately dictated CT chest abdomen pelvis for complete evaluation of findings in the chest. Similar  appearance of pre-vascular mediastinal lymph node on the prior neck CT. Other: None. IMPRESSION: No evidence of primary mucosal lesion along the visualized aerodigestive structures in the neck. No mass or abnormal enhancement demonstrated. Redemonstrated bilateral cervical and supraclavicular lymphadenopathy. There are 2 right-sided cervical nodes in level 2/3 and level 3 respectively which are slightly increased in size from prior. Additional right supraclavicular nodes which are increased in size. One of the dominant right supraclavicular lymph nodes demonstrates new central hypoattenuation suggestive of central necrosis. Left-sided lymph nodes are stable to slightly decreased from prior. Medial positioning of the right vocal folds which could reflect vocal cord paralysis. Recommend correlation with direct visualization. Electronically Signed   By: Denny Flack M.D.   On: 03/10/2024 20:58   CT CHEST ABDOMEN PELVIS W CONTRAST Result Date: 03/07/2024 EXAMINATION: CT CHEST ABDOMEN PELVIS W CONTRAST CLINICAL INDICATION: Male, 89 years old. Metastatic disease evaluation TECHNIQUE: Axial CT of  the chest, abdomen, and pelvis with 100 cc Omnipaque  300 intravenous contrast. Multiplanar reformations provided. Unless otherwise specified, incidental thyroid , adrenal, renal lesions do not require dedicated imaging follow up. Additionally, any mentioned pulmonary nodules do not require dedicated imaging follow-up based on the Fleischner guidelines unless otherwise specified. Coronary calcifications are not identified unless otherwise specified. COMPARISON: 10/12/2023 FINDINGS: CHEST: Regarding findings above the level of the thoracic inlet, please refer to the neck report. Left chest wall Mediport catheter tip terminates in the high SVC. The thoracic aorta is not aneurysmal. Scattered atherosclerotic changes are present. The main pulmonary artery is normal in caliber. Dilatation is enlarged. 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 There is a moderate hiatal hernia. There is no free fluid. Mediastinal adenopathy appears similar or improved. For example there is now a 9 mm short axis right peribronchial lymph node which previously measured 2.4 cm in short axis (series 4 image 30). Right axillary adenopathy is worsened. For example there is now 1.9 cm short axis lymph node which previously measured 1.4 cm (series 4 image 41). The trachea and mainstem bronchi are patent. Mild fibrotic changes are noted throughout the lungs. ABDOMEN/PELVIS: The liver demonstrates cirrhotic morphology. There is a calcified granuloma within the liver. There is cholelithiasis. The spleen is normal. Pancreas is normal. Adrenals are normal. Bilateral renal cysts are seen. Abdominal aorta is normal in caliber. Scattered atherosclerotic changes are present. The urinary bladder appears normal. The prostate is enlarged. Large and small bowel loops are otherwise within normal limits other than colonic diverticulosis. There is no free fluid or pathologic lymphadenopathy by size criteria. 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 No concerning osseous lesions. There are degenerative changes of the spine. Old T12 compression fracture. IMPRESSION: Mixed response with mediastinal adenopathy appearing similar or improved compared to prior and right axillary adenopathy appearing worsened compared to prior. No evidence for metastatic disease within the abdomen or pelvis. DOSE REDUCTION: All CT scans are performed using radiation dose reduction techniques, when applicable. Technical factors are evaluated and adjusted to ensure appropriate moderation of exposure. Electronically signed by: [place] Engel MD 03/07/2024 07:23 PM EDT RP Workstation: [id number]

## 2024-03-13 NOTE — Patient Instructions (Signed)
 [facility] [place] - A DEPT OF [facility]  Discharge Instructions: Thank you for choosing [facility] to provide your oncology and hematology care.  If you have a lab appointment with the Cancer Center - please note that after April 8th, 2024, all labs will be drawn in the cancer center.  You do not have to check in or register with the main entrance as you have in the past but will complete your check-in in the cancer center.  Wear comfortable clothing and clothing appropriate for easy access to any Portacath or PICC line.   We strive to give you quality time with your provider. You may need to reschedule your appointment if you arrive late (15 or more minutes).  Arriving late affects you and other patients whose appointments are after yours.  Also, if you miss three or more appointments without notifying the office, you may be dismissed from the clinic at the provider's discretion.      For prescription refill requests, have your pharmacy contact our office and allow 72 hours for refills to be completed.    Today you received the following chemotherapy and/or immunotherapy agents erbitux      To help prevent nausea and vomiting after your treatment, we encourage you to take your nausea medication as directed.  BELOW ARE SYMPTOMS THAT SHOULD BE REPORTED IMMEDIATELY: *FEVER GREATER THAN 100.4 F (38 C) OR HIGHER *CHILLS OR SWEATING *NAUSEA AND VOMITING THAT IS NOT CONTROLLED WITH YOUR NAUSEA MEDICATION *UNUSUAL SHORTNESS OF BREATH *UNUSUAL BRUISING OR BLEEDING *URINARY PROBLEMS (pain or burning when urinating, or frequent urination) *BOWEL PROBLEMS (unusual diarrhea, constipation, pain near the anus) TENDERNESS IN MOUTH AND THROAT WITH OR WITHOUT PRESENCE OF ULCERS (sore throat, sores in mouth, or a toothache) UNUSUAL RASH, SWELLING OR PAIN  UNUSUAL VAGINAL DISCHARGE OR ITCHING   Items with * indicate a potential emergency and should be followed up  as soon as possible or go to the Emergency Department if any problems should occur.  Please show the CHEMOTHERAPY ALERT CARD or IMMUNOTHERAPY ALERT CARD at check-in to the Emergency Department and triage nurse.  Should you have questions after your visit or need to cancel or reschedule your appointment, please contact [facility] [place] - A DEPT OF [facility] [phone]  and follow the prompts.  Office hours are 8:00 a.m. to 4:30 p.m. Monday - Friday. Please note that voicemails left after 4:00 p.m. may not be returned until the following business day.  We are closed weekends and major holidays. You have access to a nurse at all times for urgent questions. Please call the main number to the clinic [phone] and follow the prompts.  For any non-urgent questions, you may also contact your provider using MyChart. We now offer e-Visits for anyone 7 and older to request care online for non-urgent symptoms. For details visit [email or web address].   Also download the MyChart app! Go to the app store, search "MyChart", open the app, select , and log in with your MyChart username and password.

## 2024-03-18 DIAGNOSIS — H10023 Other mucopurulent conjunctivitis, bilateral: Secondary | ICD-10-CM | POA: Diagnosis not present

## 2024-03-20 DIAGNOSIS — H02051 Trichiasis without entropian right upper eyelid: Secondary | ICD-10-CM | POA: Diagnosis not present

## 2024-03-20 DIAGNOSIS — H02059 Trichiasis without entropian unspecified eye, unspecified eyelid: Secondary | ICD-10-CM | POA: Diagnosis not present

## 2024-03-20 DIAGNOSIS — H18461 Peripheral corneal degeneration, right eye: Secondary | ICD-10-CM | POA: Diagnosis not present

## 2024-03-27 ENCOUNTER — Inpatient Hospital Stay: Admitting: Hematology

## 2024-03-27 ENCOUNTER — Inpatient Hospital Stay

## 2024-03-27 VITALS — Wt 172.0 lb

## 2024-03-27 VITALS — BP 141/97 | HR 75 | Temp 96.5°F | Resp 18

## 2024-03-27 DIAGNOSIS — Z95828 Presence of other vascular implants and grafts: Secondary | ICD-10-CM

## 2024-03-27 DIAGNOSIS — Z801 Family history of malignant neoplasm of trachea, bronchus and lung: Secondary | ICD-10-CM | POA: Diagnosis not present

## 2024-03-27 DIAGNOSIS — C801 Malignant (primary) neoplasm, unspecified: Secondary | ICD-10-CM | POA: Diagnosis not present

## 2024-03-27 DIAGNOSIS — C4442 Squamous cell carcinoma of skin of scalp and neck: Secondary | ICD-10-CM

## 2024-03-27 DIAGNOSIS — D72820 Lymphocytosis (symptomatic): Secondary | ICD-10-CM | POA: Diagnosis not present

## 2024-03-27 DIAGNOSIS — E876 Hypokalemia: Secondary | ICD-10-CM | POA: Diagnosis not present

## 2024-03-27 DIAGNOSIS — C778 Secondary and unspecified malignant neoplasm of lymph nodes of multiple regions: Secondary | ICD-10-CM | POA: Diagnosis not present

## 2024-03-27 DIAGNOSIS — Z87891 Personal history of nicotine dependence: Secondary | ICD-10-CM | POA: Diagnosis not present

## 2024-03-27 DIAGNOSIS — R530 Neoplastic (malignant) related fatigue: Secondary | ICD-10-CM | POA: Diagnosis not present

## 2024-03-27 DIAGNOSIS — R21 Rash and other nonspecific skin eruption: Secondary | ICD-10-CM | POA: Diagnosis not present

## 2024-03-27 DIAGNOSIS — R918 Other nonspecific abnormal finding of lung field: Secondary | ICD-10-CM | POA: Diagnosis not present

## 2024-03-27 DIAGNOSIS — Z5111 Encounter for antineoplastic chemotherapy: Secondary | ICD-10-CM | POA: Diagnosis not present

## 2024-03-27 LAB — COMPREHENSIVE METABOLIC PANEL WITH GFR
ALT: 18 U/L (ref 0–44)
AST: 20 U/L (ref 15–41)
Albumin: 3.3 g/dL — ABNORMAL LOW (ref 3.5–5.0)
Alkaline Phosphatase: 69 U/L (ref 38–126)
Anion gap: 7 (ref 5–15)
BUN: 16 mg/dL (ref 8–23)
CO2: 26 mmol/L (ref 22–32)
Calcium: 8.8 mg/dL — ABNORMAL LOW (ref 8.9–10.3)
Chloride: 108 mmol/L (ref 98–111)
Creatinine, Ser: 1.02 mg/dL (ref 0.61–1.24)
GFR, Estimated: >60 mL/min (ref >60)
Glucose, Bld: 128 mg/dL — ABNORMAL HIGH (ref 70–99)
Potassium: 4.6 mmol/L (ref 3.5–5.1)
Sodium: 141 mmol/L (ref 135–145)
Total Bilirubin: 0.7 mg/dL (ref 0.0–1.2)
Total Protein: 6.1 g/dL — ABNORMAL LOW (ref 6.5–8.1)

## 2024-03-27 LAB — CBC WITH DIFFERENTIAL/PLATELET
Abs Immature Granulocytes: 0.09 10*3/uL — ABNORMAL HIGH (ref 0.00–0.07)
Basophils Absolute: 0.1 10*3/uL (ref 0.0–0.1)
Basophils Relative: 0 %
Eosinophils Absolute: 0.5 10*3/uL (ref 0.0–0.5)
Eosinophils Relative: 3 %
HCT: 42.1 % (ref 39.0–52.0)
Hemoglobin: 13.8 g/dL (ref 13.0–17.0)
Immature Granulocytes: 1 %
Lymphocytes Relative: 26 %
Lymphs Abs: 4.5 10*3/uL — ABNORMAL HIGH (ref 0.7–4.0)
MCH: 34.2 pg — ABNORMAL HIGH (ref 26.0–34.0)
MCHC: 32.8 g/dL (ref 30.0–36.0)
MCV: 104.5 fL — ABNORMAL HIGH (ref 80.0–100.0)
Monocytes Absolute: 0.8 10*3/uL (ref 0.1–1.0)
Monocytes Relative: 5 %
Neutro Abs: 11.1 10*3/uL — ABNORMAL HIGH (ref 1.7–7.7)
Neutrophils Relative %: 65 %
Platelets: 230 10*3/uL (ref 150–400)
RBC: 4.03 MIL/uL — ABNORMAL LOW (ref 4.22–5.81)
RDW: 13 % (ref 11.5–15.5)
WBC: 17 10*3/uL — ABNORMAL HIGH (ref 4.0–10.5)
nRBC: 0 % (ref 0.0–0.2)

## 2024-03-27 LAB — MAGNESIUM: Magnesium: 1.8 mg/dL (ref 1.7–2.4)

## 2024-03-27 MED ORDER — SODIUM CHLORIDE 0.9 % IV SOLN: INTRAVENOUS | Status: DC

## 2024-03-27 MED ORDER — HEPARIN SOD (PORK) LOCK FLUSH 100 UNIT/ML IV SOLN: 500.0000 [IU] | Freq: Once | INTRAVENOUS | Status: AC | PRN

## 2024-03-27 MED ORDER — SODIUM CHLORIDE 0.9% FLUSH
10.0000 mL | Freq: Once | INTRAVENOUS | Status: AC
  Administered 2024-03-27: 10 mL via INTRAVENOUS

## 2024-03-27 MED ORDER — SODIUM CHLORIDE 0.9% FLUSH
10.0000 mL | INTRAVENOUS | Status: DC | PRN
  Administered 2024-03-27: 10 mL

## 2024-03-27 MED ORDER — METHYLPREDNISOLONE SODIUM SUCC 125 MG IJ SOLR
125.0000 mg | Freq: Once | INTRAMUSCULAR | Status: AC
  Filled 2024-03-27: qty 2

## 2024-03-27 MED ORDER — CETUXIMAB CHEMO IV INJECTION 200 MG/100ML
400.0000 mg/m2 | Freq: Once | INTRAVENOUS | Status: AC
  Filled 2024-03-27: qty 100

## 2024-03-27 MED ORDER — DIPHENHYDRAMINE HCL 50 MG/ML IJ SOLN
25.0000 mg | Freq: Once | INTRAMUSCULAR | Status: AC
  Filled 2024-03-27: qty 1

## 2024-03-27 MED ORDER — FAMOTIDINE IN NACL 20-0.9 MG/50ML-% IV SOLN
20.0000 mg | Freq: Once | INTRAVENOUS | Status: AC
  Filled 2024-03-27: qty 50

## 2024-03-27 NOTE — Progress Notes (Signed)
 Patient has been examined by Dr. Cheree Cords. Vital signs and labs have been reviewed by MD - ANC, creatinine, LFTs, hemoglobin, and platelets are within treatment parameters per M.D. - pt may proceed with treatment.  Primary RN and pharmacy notified.

## 2024-03-27 NOTE — Progress Notes (Signed)
 [facility] [address], [place] [address]    Clinic Day:  03/27/2024  Referring physician: [name], [name], MD  Patient Care Team: [name], [name], MD as PCP - General (Family Medicine) [name], MD as PCP - Cardiology (Cardiology) [name], MD as Attending Physician (Urology) [name], MD as Consulting Physician (Cardiology) [name], MD (Inactive) as Consulting Physician (Gastroenterology) [name], MD as Consulting Physician (Ophthalmology) [name], MD as Consulting Physician (Orthopedic Surgery) [name], [name], LCSW as [facility] Care Management (Licensed Clinical Social Worker) [name], MD as Medical Oncologist (Medical Oncology) Gerhard Knuckles, RN as Oncology Nurse Navigator (Medical Oncology)   ASSESSMENT & PLAN:   Assessment: 1.  Right supraclavicular lymphadenopathy and multiple lung nodules: - Patient noticed right neck mass since Christmas 2022.  10 to 15 pound weight loss since January 2023.  Denies any dysphagia or odynophagia. - CT neck (05/27/2022): Numerous lymph nodes in the right neck, right supraclavicular lymph node mass measures 5.7 x 3.7 cm.  18 mm posterior lymph node in the right lower neck.  Numerous lymph nodes in the right lower and posterior neck approximately 1 cm.  Many have internal necrosis consistent with metastatic disease.  20 mm lymph node just above the right clavicle.  Left level 2 node 12 mm. - CT CAP (05/28/2022): Several bilateral lung nodules, RUL nodule measuring 1.8 x 1.3 cm.  Small clusters of nodules in the medial left upper lobe.  Irregular nodular density along the left side of the mediastinum measuring 2.1 cm.  No metastatic disease in the abdomen or pelvis.  Liver is slightly nodular contour. - Lab work shows elevated white count since 2009, predominantly lymphocytosis. - Pathology (06/09/2022): Metastatic carcinoma positive for p40, p63  and CK5/6-patchy positivity with CK7.  Negative for TTF-1, Napsin A, PAX8, prostein, PSA, CK20 and CDX2.  - PET scan (06/11/2022): Bulky right supraclavicular nodal mass, smaller hypermetabolic right posterior triangle and jugular lymph nodes and single hypermetabolic left level 3 lymph node.  Bilateral hypermetabolic pulmonary nodules and metastatic pattern.  Minimal hypermetabolic mediastinal nodal metastasis.  Cluster of hypermetabolic right axillary lymph nodes.  No evidence of metastatic disease or primary lesion in the abdomen or pelvis.  No bone lesions. - MRI of the brain: Chronic small vessel ischemic changes with no evidence of metastatic disease. - NGS: T p53 pathogenic variant, MSI-stable, TMB-low, LOH-low, PD-L1 (XB147): Negative, p16 and p18 negative.  HER2 by IHC 0. - PD-L1 22 C3: CPS score 25 - Cancer type ID: 96% probability squamous cell carcinoma, subtype head and neck/skin.  Cancer types ruled out with 95% confidence includes skin basal cell carcinoma. - Carboplatin , Taxol  and pembrolizumab  07/23/2022 through 11/09/22 - Right axillary lymph node biopsy (08/04/2023): Metastatic moderate to poorly differentiated squamous cell carcinoma. - Cetuximab  every 2 weeks started on 10/25/2023   2.  Social/family history: - He lives at home with his wife.  Independent of ADLs and IADLs.  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  May have had asbestos exposure 30 years ago.  Quit smoking cigarettes in 1965.  Smoked 1 pack/day for less than 12 years.  2 of his brothers had lung cancer and both were smokers.  3.  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    Plan: 1.  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  Last dose was on 02/28/2024. - CT soft tissue neck (03/07/2021): 2 right sided  neck lymph nodes in levels 2/3 slightly increased in size from prior.  Additional right supraclavicular node also increased in size.  Left-sided lymph nodes are stable. - CT CAP (03/07/2024): Mediastinal lymph nodes stable to mildly improved.  Right axillary lymph node grew by 2 mm.  No new adenopathy.  No evidence of metastatic disease in the abdomen or pelvis. - He had mild growth of lymph nodes as he did not receive any treatment between 12/20/2020 and 02/10/2024. - He is having problems with ingrowing eyelashes for which she sees Dr. Lincoln Renshaw once every 1 to 2 weeks. - Ingrowing eyelashes is the main side effect he is experiencing with cetuximab .  We also discussed alternatives including chemotherapy which can cause significant side effects.  At this point of time we have decided to continue cetuximab . - Labs from today: Normal LFTs.  CBC grossly normal. - Proceed with cetuximab  today at 400 mg/m dose.  Reevaluate in 2 weeks.  Will consider imaging in 8 weeks from the last.   2.  EGFR antibody induced rash: - Continue doxycycline  twice daily and clindamycin  gel for face twice daily.  Continue fluocinonide  0.05% twice daily for the rest of the body.   3.  Cancer-related fatigue: - Continue Ritalin  5 mg in the mornings as needed for fatigue.  4.  Polyarthralgias: - Continue prednisone  5 mg daily for arthralgias.  5.  Hypomagnesemia: - Continue magnesium  twice daily.  Magnesium  is 1.8.  6.  Hypokalemia: - Continue K-Dur 20 milliequivalents daily.  Potassium is normal.    Orders Placed This Encounter  Procedures   Comprehensive metabolic panel    Standing Status:   Future    Expected Date:   05/18/2024    Expiration Date:   05/18/2025   Magnesium     Standing Status:   Future    Expected Date:   05/18/2024    Expiration Date:   05/18/2025     [name],acting as a scribe for [name], MD.,have documented all relevant documentation on the behalf of [name], MD,as  directed by  [name], MD while in the presence of [name], MD.  I, [name] MD, have reviewed the above documentation for accuracy and completeness, and I agree with the above.       [name], MD   6/16/202512:49 PM  CHIEF COMPLAINT:   Diagnosis: metastatic squamous cell carcinoma    Cancer Staging  Squamous cell carcinoma of head and neck Staging form: Cervical Lymph Nodes and Unknown Primary Tumors of the Head and Neck, AJCC 8th Edition - Clinical stage from 07/16/2022: Stage IVC (cT0, cN3b, cM1) - Unsigned    Prior Therapy: Carboplatin , paclitaxel  and pembrolizumab , 07/23/22 - 11/09/22   Current Therapy: Cetuximab    HISTORY OF PRESENT ILLNESS:   Oncology History  Squamous cell carcinoma of head and neck  07/16/2022 Initial Diagnosis   Squamous cell carcinoma of head and neck (HCC)   07/23/2022 - 09/27/2023 Chemotherapy   Patient is on Treatment Plan : Carboplatin   + Paclitaxel   + Pembrolizumab  (200) D1 q21d     10/25/2023 -  Chemotherapy   Patient is on Treatment Plan : HEAD/NECK Cetuximab  q14d        INTERVAL HISTORY:   [name] is a 89 y.o. male presenting to clinic today for follow up of metastatic squamous cell carcinoma. He was last  seen by me on 03/13/24.  Today, he states that he is doing well overall. His appetite level is at 70%. His energy level is at 20%.  He is accompanied by his wife.  PAST MEDICAL HISTORY:   Past Medical History: Past Medical History:  Diagnosis Date   Anemia    Atrial fibrillation (HCC)    Cataract    Cellulitis    In past   Complication of anesthesia    HARD TIME WAKING; CAUSES MY BP TO GO UP    Coronary artery disease    5 bypasses   DJD (degenerative joint disease)    Ejection fraction < 50%    Mildly reduced, 40% by echo   GERD (gastroesophageal reflux disease)    Hyperlipidemia    Hypertension    Persistent atrial fibrillation (HCC)    Prostate hypertrophy    on CT scan  09/2014   PUD (peptic ulcer disease)    RESOLVED    Skin cancer of eyelid    Resected    Surgical History: Past Surgical History:  Procedure Laterality Date   APPENDECTOMY     BYPASS GRAFT  2005   CATARACT EXTRACTION W/PHACO Left 07/22/2015   Procedure: CATARACT EXTRACTION PHACO AND INTRAOCULAR LENS PLACEMENT LEFT EYE CDE=8.14;  Surgeon: [name], MD;  Location: [facility];  Service: Ophthalmology;  Laterality: Left;   CATARACT EXTRACTION W/PHACO Right 08/18/2019   Procedure: CATARACT EXTRACTION PHACO AND INTRAOCULAR LENS PLACEMENT (IOC);  Surgeon: Tarri Farm, MD;  Location: [facility];  Service: Ophthalmology;  Laterality: Right;  CDE: 9.74   CORONARY ARTERY BYPASS GRAFT  4/05   LIMA to LAD, SVG to PDA, SVG to ramus intermediate   EYE SURGERY  07/2015   EYELID CARCINOMA EXCISION     Skin cancer resected   Heart bypass  2005   LYMPH NODE BIOPSY Right 06/09/2022   Procedure: LYMPH NODE BIOPSY; supraclavicular;  Surgeon: Awilda Bogus, MD;  Location: [facility];  Service: General;  Laterality: Right;   PORTACATH PLACEMENT Left 06/09/2022   Procedure: INSERTION PORT-A-CATH;  Surgeon: Awilda Bogus, MD;  Location: [facility];  Service: General;  Laterality: Left;   TOTAL HIP ARTHROPLASTY     Right   TOTAL HIP ARTHROPLASTY Left 05/04/2018   Procedure: LEFT TOTAL HIP ARTHROPLASTY;  Surgeon: [name], MD;  Location: [facility];  Service: Orthopedics;  Laterality: Left;   TOTAL KNEE ARTHROPLASTY     Right    Social History: Social History   Socioeconomic History   Marital status: Married    Spouse name: [name]   Number of children: 1   Years of education: 10   Highest education level: 10th grade  Occupational History   Occupation: retired  Tobacco Use   Smoking status: Former    Current packs/day: 0.00    Average packs/day: 2.0 packs/day for 5.0 years (10.0 ttl pk-yrs)    Types: Cigarettes    Start date: 12/23/1958    Quit date: 12/23/1963    Years since quitting: 60.3    Smokeless tobacco: Never  Vaping Use   Vaping status: Never Used  Substance and Sexual Activity   Alcohol  use: No   Drug use: No   Sexual activity: Yes  Other Topics Concern   Not on file  Social History Narrative   Lives in a split level home.   Social Drivers of Health   Financial Resource Strain: Low Risk  (10/25/2023)   Overall Financial Resource Strain (CARDIA)    Difficulty  of Paying Living Expenses: Not hard at all  Food Insecurity: No Food Insecurity (10/25/2023)   Hunger Vital Sign    Worried About Running Out of Food in the Last Year: Never true    Ran Out of Food in the Last Year: Never true  Transportation Needs: No Transportation Needs (10/25/2023)   PRAPARE - Administrator, Civil Service (Medical): No    Lack of Transportation (Non-Medical): No  Physical Activity: Inactive (10/25/2023)   Exercise Vital Sign    Days of Exercise per Week: 1 day    Minutes of Exercise per Session: 0 min  Stress: No Stress Concern Present (10/25/2023)   [employer] of Occupational Health - Occupational Stress Questionnaire    Feeling of Stress : Only a little  Social Connections: Moderately Isolated (10/25/2023)   Social Connection and Isolation Panel    Frequency of Communication with Friends and Family: More than three times a week    Frequency of Social Gatherings with Friends and Family: Twice a week    Attends Religious Services: Never    Database administrator or Organizations: No    Attends Engineer, structural: Not on file    Marital Status: Married  Catering manager Violence: Not At Risk (01/06/2023)   Humiliation, Afraid, Rape, and Kick questionnaire    Fear of Current or Ex-Partner: No    Emotionally Abused: No    Physically Abused: No    Sexually Abused: No    Family History: Family History  Problem Relation Age of Onset   Stroke Mother    Stroke Father    Hypertension Father    Early death Brother        31 months old   Cancer Brother         lung   Stroke Brother        heat    Current Medications:  Current Outpatient Medications:    acetaminophen  (TYLENOL ) 500 MG tablet, Take 1,000 mg by mouth every 6 (six) hours as needed for moderate pain., Disp: , Rfl:    Cholecalciferol (VITAMIN D3) 5000 units CAPS, Take 5,000 Units by mouth once a week. , Disp: , Rfl:    clindamycin  (CLINDAGEL) 1 % gel, Apply topically 2 (two) times daily., Disp: 60 g, Rfl: 3   diltiazem  (CARDIZEM  CD) 120 MG 24 hr capsule, TAKE ONE (1) CAPSULE EACH DAY, Disp: 90 capsule, Rfl: 3   ezetimibe  (ZETIA ) 10 MG tablet, TAKE ONE (1) TABLET BY MOUTH EVERY DAY, Disp: 90 tablet, Rfl: 0   finasteride  (PROSCAR ) 5 MG tablet, TAKE ONE (1) TABLET EACH DAY, Disp: 90 tablet, Rfl: 3   fluocinonide  cream (LIDEX ) 0.05 %, Apply 1 Application topically 2 (two) times daily., Disp: , Rfl:    fluticasone  (FLONASE ) 50 MCG/ACT nasal spray, Place 1 spray into both nostrils daily as needed for allergies or rhinitis., Disp: , Rfl:    furosemide  (LASIX ) 20 MG tablet, Take 1 tablet (20 mg total) by mouth daily., Disp: 90 tablet, Rfl: 3   lovastatin  (MEVACOR ) 40 MG tablet, Take 1 tablet (40 mg total) by mouth at bedtime., Disp: 90 tablet, Rfl: 3   magnesium  oxide (MAG-OX) 400 (240 Mg) MG tablet, Take 1 tablet (400 mg total) by mouth 2 (two) times daily., Disp: 60 tablet, Rfl: 3   methylphenidate  (RITALIN ) 5 MG tablet, TAKE 1 TABLET BY MOUTH DAILY AS NEEDED, Disp: 30 tablet, Rfl: 0   metoprolol  tartrate (LOPRESSOR ) 50 MG tablet, Take 1 tablet (  50 mg total) by mouth 2 (two) times daily., Disp: 180 tablet, Rfl: 3   montelukast  (SINGULAIR ) 10 MG tablet, Take 1 tablet (10 mg total) by mouth at bedtime., Disp: 90 tablet, Rfl: 3   potassium chloride  SA (KLOR-CON  M) 20 MEQ tablet, Take 1 tablet (20 mEq total) by mouth daily., Disp: 30 tablet, Rfl: 3   predniSONE  (DELTASONE ) 5 MG tablet, Take 1 tablet (5 mg total) by mouth daily with breakfast., Disp: 90 tablet, Rfl: 2   tobramycin (TOBREX)  0.3 % ophthalmic solution, SMARTSIG:In Eye(s), Disp: , Rfl:    warfarin (COUMADIN ) 5 MG tablet, TAKE 1 TABLET ON SATURDAY, SUNDAY, TUESDAY, & THURSDAY. TAKE 1/2 TABLET ON MONDAY, WEDNESDAY, & FRIDAY, Disp: 70 tablet, Rfl: 0   fluocinonide -emollient (LIDEX -E) 0.05 % cream, Apply 1 Application topically 2 (two) times daily. Do not apply on face, Disp: 60 g, Rfl: 3 No current facility-administered medications for this visit.  Facility-Administered Medications Ordered in Other Visits:    0.9 %  sodium chloride  infusion, , Intravenous, Continuous, [name], MD, Last Rate: 10 mL/hr at 03/27/24 1143, New Bag at 03/27/24 1143   cetuximab  (ERBITUX ) chemo infusion 800 mg, 400 mg/m2 (Treatment Plan Recorded), Intravenous, Once, [name], MD   heparin  lock flush 100 unit/mL, 500 Units, Intracatheter, Once PRN, Mattson Dayal, MD   sodium chloride  flush (NS) 0.9 % injection 10 mL, 10 mL, Intracatheter, PRN, Zorana Brockwell, MD   Allergies: Allergies  Allergen Reactions   Penicillins Hives and Rash    Has patient had a PCN reaction causing immediate rash, facial/tongue/throat swelling, SOB or lightheadedness with hypotension: Yes Has patient had a PCN reaction causing severe rash involving mucus membranes or skin necrosis: Yes Has patient had a PCN reaction that required hospitalization: No Has patient had a PCN reaction occurring within the last 10 years: No If all of the above answers are NO, then may proceed with Cephalosporin use.    Cetuximab  Cough    Cough and scratchy throat. Drug rechallenged and pt able to tolerated the rest of the infusion without any complications.    Hct [Hydrochlorothiazide] Other (See Comments)    hyper   Statins Other (See Comments)    Myalgia, tolerates low dosages of lovastatin      REVIEW OF SYSTEMS:   Review of Systems  Constitutional:  Negative for chills, fatigue and fever.  HENT:   Negative for lump/mass, mouth sores,  nosebleeds, sore throat and trouble swallowing.   Eyes:  Positive for eye problems.  Respiratory:  Positive for cough. Negative for shortness of breath.   Cardiovascular:  Negative for chest pain, leg swelling and palpitations.  Gastrointestinal:  Negative for abdominal pain, constipation, diarrhea, nausea and vomiting.  Genitourinary:  Negative for bladder incontinence, difficulty urinating, dysuria, frequency, hematuria and nocturia.   Musculoskeletal:  Negative for arthralgias, back pain, flank pain, myalgias and neck pain.  Skin:  Negative for itching and rash.  Neurological:  Positive for dizziness and numbness. Negative for headaches.  Hematological:  Does not bruise/bleed easily.  Psychiatric/Behavioral:  Negative for depression, sleep disturbance and suicidal ideas. The patient is not nervous/anxious.   All other systems reviewed and are negative.    VITALS:   Weight 171 lb 15.3 oz (78 kg).  Wt Readings from Last 3 Encounters:  03/27/24 171 lb 15.3 oz (78 kg)  03/13/24 171 lb 8.3 oz (77.8 kg)  03/01/24 180 lb (81.6 kg)    Body mass index is 27.34 kg/m.  Performance status (ECOG): 1 -  Symptomatic but completely ambulatory  PHYSICAL EXAM:   Physical Exam Vitals and nursing note reviewed. Exam conducted with a chaperone present.  Constitutional:      Appearance: Normal appearance.   Cardiovascular:     Rate and Rhythm: Normal rate and regular rhythm.     Pulses: Normal pulses.     Heart sounds: Normal heart sounds.  Pulmonary:     Effort: Pulmonary effort is normal.     Breath sounds: Normal breath sounds.  Abdominal:     Palpations: Abdomen is soft. There is no hepatomegaly, splenomegaly or mass.     Tenderness: There is no abdominal tenderness.   Musculoskeletal:     Right lower leg: No edema.     Left lower leg: No edema.  Lymphadenopathy:     Cervical: No cervical adenopathy.     Right cervical: No superficial, deep or posterior cervical adenopathy.    Left  cervical: No superficial, deep or posterior cervical adenopathy.     Upper Body:     Right upper body: No supraclavicular or axillary adenopathy.     Left upper body: No supraclavicular or axillary adenopathy.   Neurological:     General: No focal deficit present.     Mental Status: He is alert and oriented to person, place, and time.   Psychiatric:        Mood and Affect: Mood normal.        Behavior: Behavior normal.     LABS:      Latest Ref Rng & Units 03/27/2024    9:23 AM 03/13/2024    9:28 AM 02/28/2024    9:17 AM  CBC  WBC 4.0 - 10.5 K/uL 17.0  18.1  15.2   Hemoglobin 13.0 - 17.0 g/dL 16.1  09.6  04.5   Hematocrit 39.0 - 52.0 % 42.1  43.8  41.9   Platelets 150 - 400 K/uL 230  290  216       Latest Ref Rng & Units 03/27/2024    9:23 AM 03/13/2024    9:28 AM 02/28/2024    9:17 AM  CMP  Glucose 70 - 99 mg/dL 409  811  914   BUN 8 - 23 mg/dL 16  16  12    Creatinine 0.61 - 1.24 mg/dL 7.82  9.56  2.13   Sodium 135 - 145 mmol/L 141  140  137   Potassium 3.5 - 5.1 mmol/L 4.6  4.4  3.0   Chloride 98 - 111 mmol/L 108  107  102   CO2 22 - 32 mmol/L 26  25  28    Calcium  8.9 - 10.3 mg/dL 8.8  8.8  8.6   Total Protein 6.5 - 8.1 g/dL 6.1  6.6  6.2   Total Bilirubin 0.0 - 1.2 mg/dL 0.7  0.6  0.8   Alkaline Phos 38 - 126 U/L 69  80  79   AST 15 - 41 U/L 20  20  23    ALT 0 - 44 U/L 18  15  18       Lab Results  Component Value Date   CEA1 5.4 (H) 06/03/2022   /  CEA  Date Value Ref Range Status  06/03/2022 5.4 (H) 0.0 - 4.7 ng/mL Final    Comment:    (NOTE)                             Nonsmokers          <  3.9                             Smokers             <5.6 [employer] Electrochemiluminescence Immunoassay (ECLIA) Values obtained with different assay methods or kits cannot be used interchangeably.  Results cannot be interpreted as absolute evidence of the presence or absence of malignant disease. Performed At: [facility] [address] [place],  [place] [id number] Pearlean Botts MD [id number]    Lab Results  Component Value Date   PSA1 9.5 (H) 03/01/2024   Lab Results  Component Value Date   CAN199 3 (H) 06/03/2022   No results found for: XBM841  No results found for: TOTALPROTELP, ALBUMINELP, A1GS, A2GS, BETS, BETA2SER, GAMS, MSPIKE, SPEI No results found for: TIBC, FERRITIN, IRONPCTSAT Lab Results  Component Value Date   LDH 164 06/03/2022     STUDIES:   CT SOFT TISSUE NECK W CONTRAST Result Date: 03/10/2024 CLINICAL DATA:  Head and neck cancer, assess treatment response. Secondary squamous cell carcinoma of the head and neck with unknown primary site. EXAM: CT NECK WITH CONTRAST TECHNIQUE: Multidetector CT imaging of the neck was performed using the standard protocol following the bolus administration of intravenous contrast. RADIATION DOSE REDUCTION: This exam was performed according to the departmental dose-optimization program which includes automated exposure control, adjustment of the mA and/or kV according to patient size and/or use of iterative reconstruction technique. CONTRAST:  OMNIPAQUE  IOHEXOL  300 MG/ML  SOLN COMPARISON:  CT neck 12/30/2023. FINDINGS: Pharynx and larynx: No evidence of mass or abnormal enhancement. The nasopharynx is symmetric. Symmetric appearance of the palatine tonsils. The palate is unremarkable. Oral cavity and floor of mouth are unremarkable. The base of tongue is unremarkable. Normal appearance of the epiglottis. No retropharyngeal effusion. Aryepiglottic folds are unremarkable. Similar laryngeal calcifications. There is medial positioning of the right vocal folds on the current study. Salivary glands: No inflammation, mass, or stone. Thyroid : No significant abnormality. Lymph nodes: Multiple prominent lymph nodes again noted. On the left: - 1.7 x 1.2 cm left level 2/3 node, previously 1.8 x 1.4 cm (series 3, image 65). - 0.5 cm inferior left level 3 node,  unchanged (series 3, image 74). - 0.6 cm left level 4 node, previously 0.7 cm (series 3, image 76). - Left supraclavicular nodes measuring up to 0.6 cm in short axis, unchanged (series 3, image 86). On the right: - 1.2 x 1.1 cm right level 2/3 node, previously 0.8 x 0.7 cm (series 3, image 63). - 1.2 x 1.0 cm right level 3 node, previously 1.0 x 0.8 cm (series 3, image 71). - 1.8 x 1.5 cm right level 4 node, previously 1.9 x 1.7 (series 3, image 80). - Right supraclavicular node measuring 2.3 x 1.7 cm now demonstrating central hypoattenuation concerning for central necrosis, previously measuring 1.9 x 1.5 cm (series 3, image 75). -2.6 x 1.8 cm right supraclavicular lymph node, previously 2.1 x 1.4 cm (series 3 image 69). - Additional right supraclavicular nodes are otherwise similar to prior. Vascular: Atherosclerosis of the partially visualized aortic arch. There is additional prominent atherosclerosis along the proximal right subclavian artery. Atherosclerosis involving the bilateral carotid bifurcations. The degree of stenosis difficult to determine on the current study was likely greater than 50%. Atherosclerosis of the carotid siphons as well. Limited intracranial: Limited visualization of intracranial structures without acute abnormality noted. Visualized orbits: Bilateral lens replacement. Mastoids and  visualized paranasal sinuses: Mucosal thickening in the bilateral ethmoid sinuses. Additional mild mucosal thickening in the right maxillary sinus. No air-fluid levels. Mastoid air cells are clear. Skeleton: Degenerative changes in the visualized spine with severe disc space narrowing at C5-6 and C6-7. No acute or aggressive finding noted. Edentulous maxilla and mandible. Upper chest: See separately dictated CT chest abdomen pelvis for complete evaluation of findings in the chest. Similar appearance of pre-vascular mediastinal lymph node on the prior neck CT. Other: None. IMPRESSION: No evidence of primary  mucosal lesion along the visualized aerodigestive structures in the neck. No mass or abnormal enhancement demonstrated. Redemonstrated bilateral cervical and supraclavicular lymphadenopathy. There are 2 right-sided cervical nodes in level 2/3 and level 3 respectively which are slightly increased in size from prior. Additional right supraclavicular nodes which are increased in size. One of the dominant right supraclavicular lymph nodes demonstrates new central hypoattenuation suggestive of central necrosis. Left-sided lymph nodes are stable to slightly decreased from prior. Medial positioning of the right vocal folds which could reflect vocal cord paralysis. Recommend correlation with direct visualization. Electronically Signed   By: Denny Flack M.D.   On: 03/10/2024 20:58   CT CHEST ABDOMEN PELVIS W CONTRAST Result Date: 03/07/2024 EXAMINATION: CT CHEST ABDOMEN PELVIS W CONTRAST CLINICAL INDICATION: Male, 89 years old. Metastatic disease evaluation TECHNIQUE: Axial CT of the chest, abdomen, and pelvis with 100 cc Omnipaque  300 intravenous contrast. Multiplanar reformations provided. Unless otherwise specified, incidental thyroid , adrenal, renal lesions do not require dedicated imaging follow up. Additionally, any mentioned pulmonary nodules do not require dedicated imaging follow-up based on the Fleischner guidelines unless otherwise specified. Coronary calcifications are not identified unless otherwise specified. COMPARISON: 10/12/2023 FINDINGS: CHEST: Regarding findings above the level of the thoracic inlet, please refer to the neck report. Left chest wall Mediport catheter tip terminates in the high SVC. The thoracic aorta is not aneurysmal. Scattered atherosclerotic changes are present. The main pulmonary artery is normal in caliber. Dilatation is enlarged. 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 There is a moderate hiatal hernia. There is no free fluid. Mediastinal adenopathy appears  similar or improved. For example there is now a 9 mm short axis right peribronchial lymph node which previously measured 2.4 cm in short axis (series 4 image 30). Right axillary adenopathy is worsened. For example there is now 1.9 cm short axis lymph node which previously measured 1.4 cm (series 4 image 41). The trachea and mainstem bronchi are patent. Mild fibrotic changes are noted throughout the lungs. ABDOMEN/PELVIS: The liver demonstrates cirrhotic morphology. There is a calcified granuloma within the liver. There is cholelithiasis. The spleen is normal. Pancreas is normal. Adrenals are normal. Bilateral renal cysts are seen. Abdominal aorta is normal in caliber. Scattered atherosclerotic changes are present. The urinary bladder appears normal. The prostate is enlarged. Large and small bowel loops are otherwise within normal limits other than colonic diverticulosis. There is no free fluid or pathologic lymphadenopathy by size criteria. 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 No concerning osseous lesions. There are degenerative changes of the spine. Old T12 compression fracture. IMPRESSION: Mixed response with mediastinal adenopathy appearing similar or improved compared to prior and right axillary adenopathy appearing worsened compared to prior. No evidence for metastatic disease within the abdomen or pelvis. DOSE REDUCTION: All CT scans are performed using radiation dose reduction techniques, when applicable. Technical factors are evaluated and adjusted to ensure appropriate moderation of exposure. Electronically signed by: [place] Engel MD 03/07/2024 07:23 PM EDT RP Workstation: [id number]

## 2024-03-27 NOTE — Progress Notes (Signed)
 Decrease diphenhydramine  to 25 mg IV prior to Erbitux .    V.O. Dr Davina Ester, PharmD

## 2024-03-27 NOTE — Patient Instructions (Signed)
 [facility] - A DEPT OF [facility]  Discharge Instructions: Thank you for choosing [facility] to provide your oncology and hematology care.  If you have a lab appointment with the Cancer Center - please note that after April 8th, 2024, all labs will be drawn in the cancer center.  You do not have to check in or register with the main entrance as you have in the past but will complete your check-in in the cancer center.  Wear comfortable clothing and clothing appropriate for easy access to any Portacath or PICC line.   We strive to give you quality time with your provider. You may need to reschedule your appointment if you arrive late (15 or more minutes).  Arriving late affects you and other patients whose appointments are after yours.  Also, if you miss three or more appointments without notifying the office, you may be dismissed from the clinic at the provider's discretion.      For prescription refill requests, have your pharmacy contact our office and allow 72 hours for refills to be completed.    Today you received the following chemotherapy and/or immunotherapy agents Erbitux   To help prevent nausea and vomiting after your treatment, we encourage you to take your nausea medication as directed.  Cetuximab Injection What is this medication? CETUXIMAB (se TUX i mab) treats head and neck cancer. It may also be used to treat colorectal cancer. It works by blocking a protein that causes cancer cells to grow and multiply. This helps to slow or stop the spread of cancer cells. It is a monoclonal antibody. This medicine may be used for other purposes; ask your health care provider or pharmacist if you have questions. COMMON BRAND NAME(S): Erbitux What should I tell my care team before I take this medication? They need to know if you have any of these conditions: Heart disease History of tick bites Low levels of calcium, magnesium, or potassium in  the blood Lung disease Red meat allergy An unusual or allergic reaction to cetuximab, other medications, foods, dyes, or preservatives Pregnant or trying to get pregnant Breast-feeding How should I use this medication? This medication is infused into a vein. It is given by your care team in a hospital or clinic setting. Talk to your care team about the use of this medication in children. Special care may be needed. Overdosage: If you think you have taken too much of this medicine contact a poison control center or emergency room at once. NOTE: This medicine is only for you. Do not share this medicine with others. What if I miss a dose? Keep appointments for follow-up doses. It is important not to miss your dose. Call your care team if you are unable to keep an appointment. What may interact with this medication? Interactions are not expected. This list may not describe all possible interactions. Give your health care provider a list of all the medicines, herbs, non-prescription drugs, or dietary supplements you use. Also tell them if you smoke, drink alcohol, or use illegal drugs. Some items may interact with your medicine. What should I watch for while using this medication? Visit your care team for regular checks on your progress. This medication may make you feel generally unwell. This is not uncommon, as chemotherapy can affect healthy cells as well as cancer cells. Report any side effects. Continue your course of treatment even though you feel ill unless your care team tells you  to stop. You may need blood work done while you are taking this medication. This medication can make you more sensitive to the sun. Keep out of the sun while taking this medication and for 2 months after the last dose. If you cannot avoid being in the sun, wear protective clothing and sunscreen. Do not use sun lamps, tanning beds, or tanning booths. This medication can cause serious infusion reactions. To reduce the  risk, your care team may give you other medications to take before receiving this one. Be sure to follow the directions of your care team. This medication may cause serious skin reactions. They can happen weeks to months after starting the medication. Contact your care team right away if you notice fevers or flu-like symptoms with a rash. The rash may be red or purple and then turn into blisters or peeling of the skin. You may also notice a red rash with swelling of the face, lips, or lymph nodes in your neck or under your arms. Talk to your care team if you may be pregnant. Serious birth defects can occur if you take this medication during pregnancy and for 2 months after the last dose. You will need a negative pregnancy test before starting this medication. Contraception is recommended while taking this medication and for 2 months after the last dose. Your care team can help you find the option that works for you. Do not breastfeed while taking this medication and for 2 months after the last dose. This medication may cause infertility. Talk to your care team if you are concerned about your fertility. What side effects may I notice from receiving this medication? Side effects that you should report to your care team as soon as possible: Allergic reactions--skin rash, itching, hives, swelling of the face, lips, tongue, or throat Dry cough, shortness of breath or trouble breathing Heart attack--pain or tightness in the chest, shoulders, arms, or jaw, nausea, shortness of breath, cold or clammy skin, feeling faint or lightheaded Infusion reactions--chest pain, shortness of breath or trouble breathing, feeling faint or lightheaded Low calcium level--muscle pain or cramps, confusion, tingling, or numbness in the hands or feet Low magnesium level--muscle pain or cramps, unusual weakness or fatigue, fast or irregular heartbeat, tremors Low potassium level--muscle pain or cramps, unusual weakness or fatigue,  fast or irregular heartbeat, constipation Redness, blistering, peeling, or loosening of the skin, including inside the mouth Side effects that usually do not require medical attention (report to your care team if they continue or are bothersome): Diarrhea Headache Joint pain Nausea Unusual weakness or fatigue Weight loss This list may not describe all possible side effects. Call your doctor for medical advice about side effects. You may report side effects to FDA at 1-800-FDA-1088. Where should I keep my medication? This medication is given in a hospital or clinic. It will not be stored at home. NOTE: This sheet is a summary. It may not cover all possible information. If you have questions about this medicine, talk to your doctor, pharmacist, or health care provider.  2024 Elsevier/Gold Standard (2022-02-13 00:00:00)   BELOW ARE SYMPTOMS THAT SHOULD BE REPORTED IMMEDIATELY: *FEVER GREATER THAN 100.4 F (38 C) OR HIGHER *CHILLS OR SWEATING *NAUSEA AND VOMITING THAT IS NOT CONTROLLED WITH YOUR NAUSEA MEDICATION *UNUSUAL SHORTNESS OF BREATH *UNUSUAL BRUISING OR BLEEDING *URINARY PROBLEMS (pain or burning when urinating, or frequent urination) *BOWEL PROBLEMS (unusual diarrhea, constipation, pain near the anus) TENDERNESS IN MOUTH AND THROAT WITH OR WITHOUT PRESENCE OF ULCERS (sore  throat, sores in mouth, or a toothache) UNUSUAL RASH, SWELLING OR PAIN  UNUSUAL VAGINAL DISCHARGE OR ITCHING   Items with * indicate a potential emergency and should be followed up as soon as possible or go to the Emergency Department if any problems should occur.  Please show the CHEMOTHERAPY ALERT CARD or IMMUNOTHERAPY ALERT CARD at check-in to the Emergency Department and triage nurse.  Should you have questions after your visit or need to cancel or reschedule your appointment, please contact [facility] [place] - A DEPT OF [facility] [phone]  and follow the prompts.  Office  hours are 8:00 a.m. to 4:30 p.m. Monday - Friday. Please note that voicemails left after 4:00 p.m. may not be returned until the following business day.  We are closed weekends and major holidays. You have access to a nurse at all times for urgent questions. Please call the main number to the clinic [phone] and follow the prompts.  For any non-urgent questions, you may also contact your provider using MyChart. We now offer e-Visits for anyone 13 and older to request care online for non-urgent symptoms. For details visit [email or web address].   Also download the MyChart app! Go to the app store, search "MyChart", open the app, select [place], and log in with your MyChart username and password.

## 2024-03-27 NOTE — Patient Instructions (Signed)

## 2024-03-27 NOTE — Progress Notes (Signed)
 Patient presents today for Erbitux  infusion. Patient is in satisfactory condition with no new complaints voiced.  Vital signs are stable.  Labs reviewed by Dr. Cheree Cords during the office visit and all labs are within treatment parameters.  We will proceed with treatment per MD orders.   Treatment given today per MD orders. Tolerated infusion without adverse affects. Vital signs stable. No complaints at this time. Discharged from clinic via wheelchair in stable condition. Alert and oriented x 3. F/U with [facility] as scheduled.

## 2024-04-04 ENCOUNTER — Other Ambulatory Visit: Payer: Self-pay | Admitting: Family Medicine

## 2024-04-10 ENCOUNTER — Inpatient Hospital Stay

## 2024-04-10 ENCOUNTER — Inpatient Hospital Stay: Admitting: Hematology

## 2024-04-10 VITALS — BP 112/83 | HR 85 | Temp 97.7°F | Resp 17

## 2024-04-10 DIAGNOSIS — C801 Malignant (primary) neoplasm, unspecified: Secondary | ICD-10-CM | POA: Diagnosis not present

## 2024-04-10 DIAGNOSIS — R918 Other nonspecific abnormal finding of lung field: Secondary | ICD-10-CM | POA: Diagnosis not present

## 2024-04-10 DIAGNOSIS — C4442 Squamous cell carcinoma of skin of scalp and neck: Secondary | ICD-10-CM

## 2024-04-10 DIAGNOSIS — C778 Secondary and unspecified malignant neoplasm of lymph nodes of multiple regions: Secondary | ICD-10-CM | POA: Diagnosis not present

## 2024-04-10 DIAGNOSIS — Z87891 Personal history of nicotine dependence: Secondary | ICD-10-CM | POA: Diagnosis not present

## 2024-04-10 DIAGNOSIS — C4492 Squamous cell carcinoma of skin, unspecified: Secondary | ICD-10-CM

## 2024-04-10 DIAGNOSIS — C7989 Secondary malignant neoplasm of other specified sites: Secondary | ICD-10-CM | POA: Diagnosis not present

## 2024-04-10 DIAGNOSIS — Z95828 Presence of other vascular implants and grafts: Secondary | ICD-10-CM

## 2024-04-10 DIAGNOSIS — R21 Rash and other nonspecific skin eruption: Secondary | ICD-10-CM | POA: Diagnosis not present

## 2024-04-10 DIAGNOSIS — Z801 Family history of malignant neoplasm of trachea, bronchus and lung: Secondary | ICD-10-CM | POA: Diagnosis not present

## 2024-04-10 DIAGNOSIS — E876 Hypokalemia: Secondary | ICD-10-CM | POA: Diagnosis not present

## 2024-04-10 DIAGNOSIS — D72820 Lymphocytosis (symptomatic): Secondary | ICD-10-CM | POA: Diagnosis not present

## 2024-04-10 DIAGNOSIS — Z5111 Encounter for antineoplastic chemotherapy: Secondary | ICD-10-CM | POA: Diagnosis not present

## 2024-04-10 DIAGNOSIS — R530 Neoplastic (malignant) related fatigue: Secondary | ICD-10-CM | POA: Diagnosis not present

## 2024-04-10 LAB — CBC WITH DIFFERENTIAL/PLATELET
Abs Immature Granulocytes: 0.08 10*3/uL — ABNORMAL HIGH (ref 0.00–0.07)
Basophils Absolute: 0 10*3/uL (ref 0.0–0.1)
Basophils Relative: 0 %
Eosinophils Absolute: 0.4 10*3/uL (ref 0.0–0.5)
Eosinophils Relative: 3 %
HCT: 41.6 % (ref 39.0–52.0)
Hemoglobin: 13.7 g/dL (ref 13.0–17.0)
Immature Granulocytes: 1 %
Lymphocytes Relative: 26 %
Lymphs Abs: 3.8 10*3/uL (ref 0.7–4.0)
MCH: 34.1 pg — ABNORMAL HIGH (ref 26.0–34.0)
MCHC: 32.9 g/dL (ref 30.0–36.0)
MCV: 103.5 fL — ABNORMAL HIGH (ref 80.0–100.0)
Monocytes Absolute: 0.9 10*3/uL (ref 0.1–1.0)
Monocytes Relative: 6 %
Neutro Abs: 9.5 10*3/uL — ABNORMAL HIGH (ref 1.7–7.7)
Neutrophils Relative %: 64 %
Platelets: 211 10*3/uL (ref 150–400)
RBC: 4.02 MIL/uL — ABNORMAL LOW (ref 4.22–5.81)
RDW: 12.9 % (ref 11.5–15.5)
WBC: 14.7 10*3/uL — ABNORMAL HIGH (ref 4.0–10.5)
nRBC: 0 % (ref 0.0–0.2)

## 2024-04-10 LAB — COMPREHENSIVE METABOLIC PANEL WITH GFR
ALT: 18 U/L (ref 0–44)
AST: 20 U/L (ref 15–41)
Albumin: 3.4 g/dL — ABNORMAL LOW (ref 3.5–5.0)
Alkaline Phosphatase: 67 U/L (ref 38–126)
Anion gap: 8 (ref 5–15)
BUN: 19 mg/dL (ref 8–23)
CO2: 26 mmol/L (ref 22–32)
Calcium: 8.8 mg/dL — ABNORMAL LOW (ref 8.9–10.3)
Chloride: 104 mmol/L (ref 98–111)
Creatinine, Ser: 0.98 mg/dL (ref 0.61–1.24)
GFR, Estimated: >60 mL/min (ref >60)
Glucose, Bld: 137 mg/dL — ABNORMAL HIGH (ref 70–99)
Potassium: 4.2 mmol/L (ref 3.5–5.1)
Sodium: 138 mmol/L (ref 135–145)
Total Bilirubin: 0.7 mg/dL (ref 0.0–1.2)
Total Protein: 6.2 g/dL — ABNORMAL LOW (ref 6.5–8.1)

## 2024-04-10 LAB — MAGNESIUM: Magnesium: 2.1 mg/dL (ref 1.7–2.4)

## 2024-04-10 MED ORDER — FLUOCINONIDE 0.05 % EX CREA: 1.0000 | TOPICAL_CREAM | Freq: Two times a day (BID) | CUTANEOUS | 3 refills | Status: AC

## 2024-04-10 MED ORDER — SODIUM CHLORIDE 0.9% FLUSH
10.0000 mL | INTRAVENOUS | Status: DC | PRN
  Administered 2024-04-10: 10 mL

## 2024-04-10 MED ORDER — METHYLPREDNISOLONE SODIUM SUCC 125 MG IJ SOLR
125.0000 mg | Freq: Once | INTRAMUSCULAR | Status: AC
  Administered 2024-04-10: 125 mg via INTRAVENOUS
  Filled 2024-04-10: qty 2

## 2024-04-10 MED ORDER — SODIUM CHLORIDE 0.9 % IV SOLN: INTRAVENOUS | Status: DC

## 2024-04-10 MED ORDER — HEPARIN SOD (PORK) LOCK FLUSH 100 UNIT/ML IV SOLN
500.0000 [IU] | Freq: Once | INTRAVENOUS | Status: AC | PRN
  Administered 2024-04-10: 500 [IU]

## 2024-04-10 MED ORDER — CETUXIMAB CHEMO IV INJECTION 200 MG/100ML
400.0000 mg/m2 | Freq: Once | INTRAVENOUS | Status: AC
  Administered 2024-04-10: 800 mg via INTRAVENOUS
  Filled 2024-04-10: qty 300

## 2024-04-10 MED ORDER — SODIUM CHLORIDE 0.9% FLUSH
10.0000 mL | Freq: Once | INTRAVENOUS | Status: AC
  Administered 2024-04-10: 10 mL via INTRAVENOUS

## 2024-04-10 MED ORDER — DIPHENHYDRAMINE HCL 50 MG/ML IJ SOLN
25.0000 mg | Freq: Once | INTRAMUSCULAR | Status: AC
  Administered 2024-04-10: 25 mg via INTRAVENOUS
  Filled 2024-04-10: qty 1

## 2024-04-10 MED ORDER — FAMOTIDINE IN NACL 20-0.9 MG/50ML-% IV SOLN
20.0000 mg | Freq: Once | INTRAVENOUS | Status: AC
  Administered 2024-04-10: 20 mg via INTRAVENOUS
  Filled 2024-04-10: qty 50

## 2024-04-10 NOTE — Progress Notes (Signed)
 Patient presents today for chemotherapy infusion. Patient is in satisfactory condition with no new complaints voiced.  Vital signs are stable.  Labs reviewed by Dr. Rogers during the office visit and all labs are within treatment parameters.  We will proceed with treatment per MD orders.   Patient tolerated treatment well with no complaints voiced.  Patient left via wheelchair with wife in stable condition.  Vital signs stable at discharge.  Follow up as scheduled.

## 2024-04-10 NOTE — Patient Instructions (Signed)

## 2024-04-10 NOTE — Patient Instructions (Signed)
 [facility] - A DEPT OF [facility]  Discharge Instructions: Thank you for choosing [facility] to provide your oncology and hematology care.  If you have a lab appointment with the Cancer Center - please note that after April 8th, 2024, all labs will be drawn in the cancer center.  You do not have to check in or register with the main entrance as you have in the past but will complete your check-in in the cancer center.  Wear comfortable clothing and clothing appropriate for easy access to any Portacath or PICC line.   We strive to give you quality time with your provider. You may need to reschedule your appointment if you arrive late (15 or more minutes).  Arriving late affects you and other patients whose appointments are after yours.  Also, if you miss three or more appointments without notifying the office, you may be dismissed from the clinic at the provider's discretion.      For prescription refill requests, have your pharmacy contact our office and allow 72 hours for refills to be completed.    Today you received the following chemotherapy and/or immunotherapy agents Erbitux.  Cetuximab Injection What is this medication? CETUXIMAB (se TUX i mab) treats head and neck cancer. It may also be used to treat colorectal cancer. It works by blocking a protein that causes cancer cells to grow and multiply. This helps to slow or stop the spread of cancer cells. It is a monoclonal antibody. This medicine may be used for other purposes; ask your health care provider or pharmacist if you have questions. COMMON BRAND NAME(S): Erbitux What should I tell my care team before I take this medication? They need to know if you have any of these conditions: Heart disease History of tick bites Low levels of calcium, magnesium, or potassium in the blood Lung disease Red meat allergy An unusual or allergic reaction to cetuximab, other medications, foods, dyes,  or preservatives Pregnant or trying to get pregnant Breast-feeding How should I use this medication? This medication is infused into a vein. It is given by your care team in a hospital or clinic setting. Talk to your care team about the use of this medication in children. Special care may be needed. Overdosage: If you think you have taken too much of this medicine contact a poison control center or emergency room at once. NOTE: This medicine is only for you. Do not share this medicine with others. What if I miss a dose? Keep appointments for follow-up doses. It is important not to miss your dose. Call your care team if you are unable to keep an appointment. What may interact with this medication? Interactions are not expected. This list may not describe all possible interactions. Give your health care provider a list of all the medicines, herbs, non-prescription drugs, or dietary supplements you use. Also tell them if you smoke, drink alcohol, or use illegal drugs. Some items may interact with your medicine. What should I watch for while using this medication? Visit your care team for regular checks on your progress. This medication may make you feel generally unwell. This is not uncommon, as chemotherapy can affect healthy cells as well as cancer cells. Report any side effects. Continue your course of treatment even though you feel ill unless your care team tells you to stop. You may need blood work done while you are taking this medication. This medication can make you more sensitive  to the sun. Keep out of the sun while taking this medication and for 2 months after the last dose. If you cannot avoid being in the sun, wear protective clothing and sunscreen. Do not use sun lamps, tanning beds, or tanning booths. This medication can cause serious infusion reactions. To reduce the risk, your care team may give you other medications to take before receiving this one. Be sure to follow the directions of  your care team. This medication may cause serious skin reactions. They can happen weeks to months after starting the medication. Contact your care team right away if you notice fevers or flu-like symptoms with a rash. The rash may be red or purple and then turn into blisters or peeling of the skin. You may also notice a red rash with swelling of the face, lips, or lymph nodes in your neck or under your arms. Talk to your care team if you may be pregnant. Serious birth defects can occur if you take this medication during pregnancy and for 2 months after the last dose. You will need a negative pregnancy test before starting this medication. Contraception is recommended while taking this medication and for 2 months after the last dose. Your care team can help you find the option that works for you. Do not breastfeed while taking this medication and for 2 months after the last dose. This medication may cause infertility. Talk to your care team if you are concerned about your fertility. What side effects may I notice from receiving this medication? Side effects that you should report to your care team as soon as possible: Allergic reactions--skin rash, itching, hives, swelling of the face, lips, tongue, or throat Dry cough, shortness of breath or trouble breathing Heart attack--pain or tightness in the chest, shoulders, arms, or jaw, nausea, shortness of breath, cold or clammy skin, feeling faint or lightheaded Infusion reactions--chest pain, shortness of breath or trouble breathing, feeling faint or lightheaded Low calcium level--muscle pain or cramps, confusion, tingling, or numbness in the hands or feet Low magnesium level--muscle pain or cramps, unusual weakness or fatigue, fast or irregular heartbeat, tremors Low potassium level--muscle pain or cramps, unusual weakness or fatigue, fast or irregular heartbeat, constipation Redness, blistering, peeling, or loosening of the skin, including inside the  mouth Side effects that usually do not require medical attention (report to your care team if they continue or are bothersome): Diarrhea Headache Joint pain Nausea Unusual weakness or fatigue Weight loss This list may not describe all possible side effects. Call your doctor for medical advice about side effects. You may report side effects to FDA at 1-800-FDA-1088. Where should I keep my medication? This medication is given in a hospital or clinic. It will not be stored at home. NOTE: This sheet is a summary. It may not cover all possible information. If you have questions about this medicine, talk to your doctor, pharmacist, or health care provider.  2024 Elsevier/Gold Standard (2022-02-13 00:00:00)       To help prevent nausea and vomiting after your treatment, we encourage you to take your nausea medication as directed.  BELOW ARE SYMPTOMS THAT SHOULD BE REPORTED IMMEDIATELY: *FEVER GREATER THAN 100.4 F (38 C) OR HIGHER *CHILLS OR SWEATING *NAUSEA AND VOMITING THAT IS NOT CONTROLLED WITH YOUR NAUSEA MEDICATION *UNUSUAL SHORTNESS OF BREATH *UNUSUAL BRUISING OR BLEEDING *URINARY PROBLEMS (pain or burning when urinating, or frequent urination) *BOWEL PROBLEMS (unusual diarrhea, constipation, pain near the anus) TENDERNESS IN MOUTH AND THROAT WITH OR WITHOUT PRESENCE  OF ULCERS (sore throat, sores in mouth, or a toothache) UNUSUAL RASH, SWELLING OR PAIN  UNUSUAL VAGINAL DISCHARGE OR ITCHING   Items with * indicate a potential emergency and should be followed up as soon as possible or go to the Emergency Department if any problems should occur.  Please show the CHEMOTHERAPY ALERT CARD or IMMUNOTHERAPY ALERT CARD at check-in to the Emergency Department and triage nurse.  Should you have questions after your visit or need to cancel or reschedule your appointment, please contact [facility] [place] - A DEPT OF Eligha Brideg[facility] [phone]  and follow the prompts.   Office hours are 8:00 a.m. to 4:30 p.m. Monday - Friday. Please note that voicemails left after 4:00 p.m. may not be returned until the following business day.  We are closed weekends and major holidays. You have access to a nurse at all times for urgent questions. Please call the main number to the clinic [phone] and follow the prompts.  For any non-urgent questions, you may also contact your provider using MyChart. We now offer e-Visits for anyone 82 and older to request care online for non-urgent symptoms. For details visit [email or web address].   Also download the MyChart app! Go to the app store, search "MyChart", open the app, select [facility], and log in with your MyChart username and password.

## 2024-04-10 NOTE — Progress Notes (Signed)
 [facility] [address], [place] [address]    Clinic Day:  04/10/2024  Referring physician: [name], Fonda LABOR, MD  Patient Care Team: [name], Fonda LABOR, MD as PCP - General (Family Medicine) [name], MD as PCP - Cardiology (Cardiology) Watt Rush, MD as Attending Physician (Urology) [name], MD as Consulting Physician (Cardiology) Golda Claudis PENNER, MD (Inactive) as Consulting Physician (Gastroenterology) Perley Hamilton, MD as Consulting Physician (Ophthalmology) Heide Ingle, MD as Consulting Physician (Orthopedic Surgery) Frances, Ozell RAMAN, LCSW as [facility] Care Management (Licensed Clinical Social Worker) Rogers Hai, MD as Medical Oncologist (Medical Oncology) Celestia Joesph SQUIBB, RN as Oncology Nurse Navigator (Medical Oncology)   ASSESSMENT & PLAN:   Assessment: 1.  Right supraclavicular lymphadenopathy and multiple lung nodules: - Patient noticed right neck mass since Christmas 2022.  10 to 15 pound weight loss since January 2023.  Denies any dysphagia or odynophagia. - CT neck (05/27/2022): Numerous lymph nodes in the right neck, right supraclavicular lymph node mass measures 5.7 x 3.7 cm.  18 mm posterior lymph node in the right lower neck.  Numerous lymph nodes in the right lower and posterior neck approximately 1 cm.  Many have internal necrosis consistent with metastatic disease.  20 mm lymph node just above the right clavicle.  Left level 2 node 12 mm. - CT CAP (05/28/2022): Several bilateral lung nodules, RUL nodule measuring 1.8 x 1.3 cm.  Small clusters of nodules in the medial left upper lobe.  Irregular nodular density along the left side of the mediastinum measuring 2.1 cm.  No metastatic disease in the abdomen or pelvis.  Liver is slightly nodular contour. - Lab work shows elevated white count since 2009, predominantly lymphocytosis. - Pathology (06/09/2022): Metastatic carcinoma positive for p40, p63  and CK5/6-patchy positivity with CK7.  Negative for TTF-1, Napsin A, PAX8, prostein, PSA, CK20 and CDX2.  - PET scan (06/11/2022): Bulky right supraclavicular nodal mass, smaller hypermetabolic right posterior triangle and jugular lymph nodes and single hypermetabolic left level 3 lymph node.  Bilateral hypermetabolic pulmonary nodules and metastatic pattern.  Minimal hypermetabolic mediastinal nodal metastasis.  Cluster of hypermetabolic right axillary lymph nodes.  No evidence of metastatic disease or primary lesion in the abdomen or pelvis.  No bone lesions. - MRI of the brain: Chronic small vessel ischemic changes with no evidence of metastatic disease. - NGS: T p53 pathogenic variant, MSI-stable, TMB-low, LOH-low, PD-L1 (SP142): Negative, p16 and p18 negative.  HER2 by IHC 0. - PD-L1 22 C3: CPS score 25 - Cancer type ID: 96% probability squamous cell carcinoma, subtype head and neck/skin.  Cancer types ruled out with 95% confidence includes skin basal cell carcinoma. - Carboplatin , Taxol  and pembrolizumab  07/23/2022 through 11/09/22 - Right axillary lymph node biopsy (08/04/2023): Metastatic moderate to poorly differentiated squamous cell carcinoma. - Cetuximab  every 2 weeks started on 10/25/2023   2.  Social/family history: - He lives at home with his wife.  Independent of ADLs and IADLs.  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  May have had asbestos exposure 30 years ago.  Quit smoking cigarettes in 1965.  Smoked 1 pack/day for less than 12 years.  2 of his brothers had lung cancer and both were smokers.  3.  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    Plan: 1.  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  Last dose was on 02/28/2024. - CT soft tissue neck (03/07/2021): 2 right sided  neck lymph nodes in levels 2/3 slightly increased in size from prior.  Additional right supraclavicular node also increased in size.  Left-sided lymph nodes are stable. - CT CAP (03/07/2024): Mediastinal lymph nodes stable to mildly improved.  Right axillary lymph node grew by 2 mm.  No new adenopathy.  No evidence of metastatic disease in the abdomen or pelvis. - He had mild growth of lymph nodes as he did not receive any treatment between 12/20/2020 and 02/10/2024. - In the last 2 weeks, he did not have to see the eye doctor for ingrowing eyelashes. - Reviewed labs today: Normal LFTs.  CBC grossly normal with elevated white count from prednisone . - Recommend continuing cetuximab  (dose reduced) today and in 2 weeks.  RTC 4 weeks for follow-up with repeat CT neck and CT chest.   2.  EGFR antibody induced rash: - Continue doxycycline  twice daily and clindamycin  gel for face twice daily.  Continue fluocinonide  0.05% twice daily for the rest of the body.  Continue to avoid direct sun exposure.   3.  Cancer-related fatigue: - Continue Ritalin  5 mg in the mornings as needed for fatigue.  4.  Polyarthralgias: - Continue prednisone  5 mg daily for arthralgias.  5.  Hypomagnesemia: - Continue magnesium  twice daily.  Magnesium  is normal.  6.  Hypokalemia: - Continue K-Dur 20 milliequivalents daily.  Potassium is normal.    Orders Placed This Encounter  Procedures   CT CHEST W CONTRAST    Standing Status:   Future    Expected Date:   05/10/2024    Expiration Date:   04/10/2025    If indicated for the ordered procedure, I authorize the administration of contrast media per Radiology protocol:   Yes    Does the patient have a contrast media/X-ray dye allergy?:   No    Preferred imaging location?:   [facility]   CT SOFT TISSUE NECK W CONTRAST    Standing Status:   Future    Expected Date:   05/10/2024    Expiration Date:   04/10/2025    If indicated for the ordered procedure, I authorize the  administration of contrast media per Radiology protocol:   Yes    Does the patient have a contrast media/X-ray dye allergy?:   No    Preferred imaging location?:   [facility] R Teague,acting as a scribe for Alean Stands, MD.,have documented all relevant documentation on the behalf of Alean Stands, MD,as directed by  Alean Stands, MD while in the presence of Alean Stands, MD.  I, Alean Stands MD, have reviewed the above documentation for accuracy and completeness, and I agree with the above.      Alean Stands, MD   6/30/20255:04 PM  CHIEF COMPLAINT:   Diagnosis: metastatic squamous cell carcinoma    Cancer Staging  Squamous cell carcinoma of head and neck Staging form: Cervical Lymph Nodes and Unknown Primary Tumors of the Head and Neck, AJCC 8th Edition - Clinical stage from 07/16/2022: Stage IVC (cT0, cN3b, cM1) - Unsigned    Prior Therapy: Carboplatin , paclitaxel  and pembrolizumab , 07/23/22 - 11/09/22   Current Therapy: Cetuximab    HISTORY OF PRESENT ILLNESS:   Oncology History  Squamous cell carcinoma of head and neck  07/16/2022 Initial Diagnosis   Squamous cell carcinoma of head and neck (HCC)  07/23/2022 - 09/27/2023 Chemotherapy   Patient is on Treatment Plan : Carboplatin   + Paclitaxel   + Pembrolizumab  (200) D1 q21d     10/25/2023 -  Chemotherapy   Patient is on Treatment Plan : HEAD/NECK Cetuximab  q14d        INTERVAL HISTORY:   [name] is a 89 y.o. male presenting to clinic today for follow up of metastatic squamous cell carcinoma. He was last seen by me on 03/27/24.  Today, he states that he is doing well overall. His appetite level is at 75%. His energy level is at 20%.  He is accompanied by his wife.  PAST MEDICAL HISTORY:   Past Medical History: Past Medical History:  Diagnosis Date   Anemia    Atrial fibrillation (HCC)    Cataract    Cellulitis    In past   Complication of  anesthesia    HARD TIME WAKING; CAUSES MY BP TO GO UP    Coronary artery disease    5 bypasses   DJD (degenerative joint disease)    Ejection fraction < 50%    Mildly reduced, 40% by echo   GERD (gastroesophageal reflux disease)    Hyperlipidemia    Hypertension    Persistent atrial fibrillation (HCC)    Prostate hypertrophy    on CT scan 09/2014   PUD (peptic ulcer disease)    RESOLVED    Skin cancer of eyelid    Resected    Surgical History: Past Surgical History:  Procedure Laterality Date   APPENDECTOMY     BYPASS GRAFT  2005   CATARACT EXTRACTION W/PHACO Left 07/22/2015   Procedure: CATARACT EXTRACTION PHACO AND INTRAOCULAR LENS PLACEMENT LEFT EYE CDE=8.14;  Surgeon: Cherene Mania, MD;  Location: [facility];  Service: Ophthalmology;  Laterality: Left;   CATARACT EXTRACTION W/PHACO Right 08/18/2019   Procedure: CATARACT EXTRACTION PHACO AND INTRAOCULAR LENS PLACEMENT (IOC);  Surgeon: Harrie Agent, MD;  Location: [facility];  Service: Ophthalmology;  Laterality: Right;  CDE: 9.74   CORONARY ARTERY BYPASS GRAFT  4/05   LIMA to LAD, SVG to PDA, SVG to ramus intermediate   EYE SURGERY  07/2015   EYELID CARCINOMA EXCISION     Skin cancer resected   Heart bypass  2005   LYMPH NODE BIOPSY Right 06/09/2022   Procedure: LYMPH NODE BIOPSY; supraclavicular;  Surgeon: Kallie Manuelita BROCKS, MD;  Location: [facility];  Service: General;  Laterality: Right;   PORTACATH PLACEMENT Left 06/09/2022   Procedure: INSERTION PORT-A-CATH;  Surgeon: Kallie Manuelita BROCKS, MD;  Location: [facility];  Service: General;  Laterality: Left;   TOTAL HIP ARTHROPLASTY     Right   TOTAL HIP ARTHROPLASTY Left 05/04/2018   Procedure: LEFT TOTAL HIP ARTHROPLASTY;  Surgeon: Heide Ingle, MD;  Location: [facility];  Service: Orthopedics;  Laterality: Left;   TOTAL KNEE ARTHROPLASTY     Right    Social History: Social History   Socioeconomic History   Marital status: Married    Spouse name: [name]   Number of children: 1    Years of education: 10   Highest education level: 10th grade  Occupational History   Occupation: retired  Tobacco Use   Smoking status: Former    Current packs/day: 0.00    Average packs/day: 2.0 packs/day for 5.0 years (10.0 ttl pk-yrs)    Types: Cigarettes    Start date: 12/23/1958    Quit date: 12/23/1963    Years since quitting: 60.3   Smokeless tobacco: Never  Vaping Use  Vaping status: Never Used  Substance and Sexual Activity   Alcohol  use: No   Drug use: No   Sexual activity: Yes  Other Topics Concern   Not on file  Social History Narrative   Lives in a split level home.   Social Drivers of Corporate investment banker Strain: Low Risk  (04/09/2024)   Overall Financial Resource Strain (CARDIA)    Difficulty of Paying Living Expenses: Not hard at all  Food Insecurity: No Food Insecurity (04/09/2024)   Hunger Vital Sign    Worried About Running Out of Food in the Last Year: Never true    Ran Out of Food in the Last Year: Never true  Transportation Needs: No Transportation Needs (04/09/2024)   PRAPARE - Administrator, Civil Service (Medical): No    Lack of Transportation (Non-Medical): No  Physical Activity: Inactive (04/09/2024)   Exercise Vital Sign    Days of Exercise per Week: 0 days    Minutes of Exercise per Session: Not on file  Stress: No Stress Concern Present (04/09/2024)   [employer] of Occupational Health - Occupational Stress Questionnaire    Feeling of Stress: Only a little  Social Connections: Moderately Isolated (04/09/2024)   Social Connection and Isolation Panel    Frequency of Communication with Friends and Family: More than three times a week    Frequency of Social Gatherings with Friends and Family: More than three times a week    Attends Religious Services: Never    Database administrator or Organizations: No    Attends Engineer, structural: Not on file    Marital Status: Married  Catering manager Violence: Not At Risk  (01/06/2023)   Humiliation, Afraid, Rape, and Kick questionnaire    Fear of Current or Ex-Partner: No    Emotionally Abused: No    Physically Abused: No    Sexually Abused: No    Family History: Family History  Problem Relation Age of Onset   Stroke Mother    Stroke Father    Hypertension Father    Early death Brother        48 months old   Cancer Brother        lung   Stroke Brother        heat    Current Medications:  Current Outpatient Medications:    acetaminophen  (TYLENOL ) 500 MG tablet, Take 1,000 mg by mouth every 6 (six) hours as needed for moderate pain., Disp: , Rfl:    Cholecalciferol (VITAMIN D3) 5000 units CAPS, Take 5,000 Units by mouth once a week. , Disp: , Rfl:    clindamycin  (CLINDAGEL) 1 % gel, Apply 1 Application topically 2 (two) times daily., Disp: , Rfl:    diltiazem  (CARDIZEM  CD) 120 MG 24 hr capsule, TAKE ONE CAPSULE BY MOUTH DAILY, Disp: 90 capsule, Rfl: 0   ezetimibe  (ZETIA ) 10 MG tablet, TAKE ONE (1) TABLET BY MOUTH EVERY DAY, Disp: 90 tablet, Rfl: 0   finasteride  (PROSCAR ) 5 MG tablet, TAKE ONE (1) TABLET EACH DAY, Disp: 90 tablet, Rfl: 3   fluticasone  (FLONASE ) 50 MCG/ACT nasal spray, Place 1 spray into both nostrils daily as needed for allergies or rhinitis., Disp: , Rfl:    furosemide  (LASIX ) 20 MG tablet, Take 1 tablet (20 mg total) by mouth daily., Disp: 90 tablet, Rfl: 3   lovastatin  (MEVACOR ) 40 MG tablet, Take 1 tablet (40 mg total) by mouth at bedtime., Disp: 90 tablet, Rfl: 3  magnesium  oxide (MAG-OX) 400 (240 Mg) MG tablet, Take 1 tablet (400 mg total) by mouth 2 (two) times daily., Disp: 60 tablet, Rfl: 3   methylphenidate  (RITALIN ) 5 MG tablet, TAKE 1 TABLET BY MOUTH DAILY AS NEEDED, Disp: 30 tablet, Rfl: 0   metoprolol  tartrate (LOPRESSOR ) 50 MG tablet, Take 1 tablet (50 mg total) by mouth 2 (two) times daily., Disp: 180 tablet, Rfl: 3   montelukast  (SINGULAIR ) 10 MG tablet, Take 1 tablet (10 mg total) by mouth at bedtime., Disp: 90  tablet, Rfl: 3   potassium chloride  SA (KLOR-CON  M) 20 MEQ tablet, Take 1 tablet (20 mEq total) by mouth daily., Disp: 30 tablet, Rfl: 3   predniSONE  (DELTASONE ) 5 MG tablet, Take 1 tablet (5 mg total) by mouth daily with breakfast., Disp: 90 tablet, Rfl: 2   tobramycin (TOBREX) 0.3 % ophthalmic solution, SMARTSIG:In Eye(s), Disp: , Rfl:    warfarin (COUMADIN ) 5 MG tablet, TAKE 1 TABLET ON SATURDAY, SUNDAY, TUESDAY, & THURSDAY. TAKE 1/2 TABLET ON MONDAY, WEDNESDAY, & FRIDAY, Disp: 70 tablet, Rfl: 0   fluocinonide  cream (LIDEX ) 0.05 %, Apply 1 Application topically 2 (two) times daily., Disp: 60 g, Rfl: 3 No current facility-administered medications for this visit.  Facility-Administered Medications Ordered in Other Visits:    0.9 %  sodium chloride  infusion, , Intravenous, Continuous, Rogers Hai, MD, Stopped at 04/10/24 1423   sodium chloride  flush (NS) 0.9 % injection 10 mL, 10 mL, Intracatheter, PRN, Isidor Bromell, MD, 10 mL at 04/10/24 1424   Allergies: Allergies  Allergen Reactions   Penicillins Hives and Rash    Has patient had a PCN reaction causing immediate rash, facial/tongue/throat swelling, SOB or lightheadedness with hypotension: Yes Has patient had a PCN reaction causing severe rash involving mucus membranes or skin necrosis: Yes Has patient had a PCN reaction that required hospitalization: No Has patient had a PCN reaction occurring within the last 10 years: No If all of the above answers are NO, then may proceed with Cephalosporin use.    Cetuximab  Cough    Cough and scratchy throat. Drug rechallenged and pt able to tolerated the rest of the infusion without any complications.    Hct [Hydrochlorothiazide] Other (See Comments)    hyper   Statins Other (See Comments)    Myalgia, tolerates low dosages of lovastatin      REVIEW OF SYSTEMS:   Review of Systems  Constitutional:  Negative for chills, fatigue and fever.  HENT:   Negative for lump/mass, mouth  sores, nosebleeds, sore throat and trouble swallowing.   Eyes:  Negative for eye problems.  Respiratory:  Positive for shortness of breath. Negative for cough.   Cardiovascular:  Negative for chest pain, leg swelling and palpitations.  Gastrointestinal:  Positive for constipation. Negative for abdominal pain, diarrhea, nausea and vomiting.  Genitourinary:  Negative for bladder incontinence, difficulty urinating, dysuria, frequency, hematuria and nocturia.   Musculoskeletal:  Negative for arthralgias, back pain, flank pain, myalgias and neck pain.  Skin:  Negative for itching and rash.  Neurological:  Positive for dizziness, headaches and numbness.  Hematological:  Does not bruise/bleed easily.  Psychiatric/Behavioral:  Negative for depression, sleep disturbance and suicidal ideas. The patient is not nervous/anxious.   All other systems reviewed and are negative.    VITALS:   There were no vitals taken for this visit.  Wt Readings from Last 3 Encounters:  04/10/24 172 lb 2.9 oz (78.1 kg)  03/27/24 171 lb 15.3 oz (78 kg)  03/13/24 171 lb  8.3 oz (77.8 kg)    There is no height or weight on file to calculate BMI.  Performance status (ECOG): 1 - Symptomatic but completely ambulatory  PHYSICAL EXAM:   Physical Exam Vitals and nursing note reviewed. Exam conducted with a chaperone present.  Constitutional:      Appearance: Normal appearance.   Cardiovascular:     Rate and Rhythm: Normal rate and regular rhythm.     Pulses: Normal pulses.     Heart sounds: Normal heart sounds.  Pulmonary:     Effort: Pulmonary effort is normal.     Breath sounds: Normal breath sounds.  Abdominal:     Palpations: Abdomen is soft. There is no hepatomegaly, splenomegaly or mass.     Tenderness: There is no abdominal tenderness.   Musculoskeletal:     Right lower leg: No edema.     Left lower leg: No edema.  Lymphadenopathy:     Cervical: No cervical adenopathy.     Right cervical: No  superficial, deep or posterior cervical adenopathy.    Left cervical: No superficial, deep or posterior cervical adenopathy.     Upper Body:     Right upper body: No supraclavicular or axillary adenopathy.     Left upper body: No supraclavicular or axillary adenopathy.   Neurological:     General: No focal deficit present.     Mental Status: He is alert and oriented to person, place, and time.   Psychiatric:        Mood and Affect: Mood normal.        Behavior: Behavior normal.     LABS:      Latest Ref Rng & Units 04/10/2024    9:31 AM 03/27/2024    9:23 AM 03/13/2024    9:28 AM  CBC  WBC 4.0 - 10.5 K/uL 14.7  17.0  18.1   Hemoglobin 13.0 - 17.0 g/dL 86.2  86.1  85.8   Hematocrit 39.0 - 52.0 % 41.6  42.1  43.8   Platelets 150 - 400 K/uL 211  230  290       Latest Ref Rng & Units 04/10/2024    9:31 AM 03/27/2024    9:23 AM 03/13/2024    9:28 AM  CMP  Glucose 70 - 99 mg/dL 862  871  885   BUN 8 - 23 mg/dL 19  16  16    Creatinine 0.61 - 1.24 mg/dL 9.01  8.97  8.86   Sodium 135 - 145 mmol/L 138  141  140   Potassium 3.5 - 5.1 mmol/L 4.2  4.6  4.4   Chloride 98 - 111 mmol/L 104  108  107   CO2 22 - 32 mmol/L 26  26  25    Calcium  8.9 - 10.3 mg/dL 8.8  8.8  8.8   Total Protein 6.5 - 8.1 g/dL 6.2  6.1  6.6   Total Bilirubin 0.0 - 1.2 mg/dL 0.7  0.7  0.6   Alkaline Phos 38 - 126 U/L 67  69  80   AST 15 - 41 U/L 20  20  20    ALT 0 - 44 U/L 18  18  15       Lab Results  Component Value Date   CEA1 5.4 (H) 06/03/2022   /  CEA  Date Value Ref Range Status  06/03/2022 5.4 (H) 0.0 - 4.7 ng/mL Final    Comment:    (NOTE)  Nonsmokers          <3.9                             Smokers             <5.6 Roche [employer] Electrochemiluminescence Immunoassay (ECLIA) Values obtained with different assay methods or kits cannot be used interchangeably.  Results cannot be interpreted as absolute evidence of the presence or absence of malignant  disease. Performed At: [facility] [address] [place], [place] [id number] Jennette Shorter MD Ey:[id number]    Lab Results  Component Value Date   PSA1 9.5 (H) 03/01/2024   Lab Results  Component Value Date   CAN199 78 (H) 06/03/2022   No results found for: CAN125  No results found for: TOTALPROTELP, ALBUMINELP, A1GS, A2GS, BETS, BETA2SER, GAMS, MSPIKE, SPEI No results found for: TIBC, FERRITIN, IRONPCTSAT Lab Results  Component Value Date   LDH 164 06/03/2022     STUDIES:   No results found.

## 2024-04-13 ENCOUNTER — Ambulatory Visit: Admitting: Family Medicine

## 2024-04-13 ENCOUNTER — Encounter: Payer: Self-pay | Admitting: Family Medicine

## 2024-04-13 VITALS — BP 134/74 | HR 139 | Ht 66.5 in | Wt 172.0 lb

## 2024-04-13 DIAGNOSIS — Z7901 Long term (current) use of anticoagulants: Secondary | ICD-10-CM | POA: Diagnosis not present

## 2024-04-13 DIAGNOSIS — I4819 Other persistent atrial fibrillation: Secondary | ICD-10-CM

## 2024-04-13 LAB — COAGUCHEK XS/INR WAIVED
INR: 2.7 — ABNORMAL HIGH (ref 0.9–1.1)
Prothrombin Time: 32.4 s

## 2024-04-13 MED ORDER — LOVASTATIN 40 MG PO TABS: 40.0000 mg | ORAL_TABLET | Freq: Every day | ORAL | 3 refills | Status: AC

## 2024-04-13 MED ORDER — WARFARIN SODIUM 5 MG PO TABS: ORAL_TABLET | ORAL | 3 refills | Status: DC

## 2024-04-13 NOTE — Progress Notes (Signed)
 BP 134/74   Pulse (!) 139   Ht 5' 6.5 (1.689 m)   Wt 172 lb (78 kg)   SpO2 95%   BMI 27.35 kg/m    Subjective:   Patient ID: [name], male    DOB: 01-12-1932, 89 y.o.   MRN: [id number]  HPI: [name] is a 89 y.o. male presenting on 04/13/2024 for Medical Management of Chronic Issues and Atrial Fibrillation   HPI Coumadin  recheck Target goal: 2.0-3.0 Reason on anticoagulation: A-fib Patient denies any bruising or bleeding or chest pain or palpitations   Relevant past medical, surgical, family and social history reviewed and updated as indicated. Interim medical history since our last visit reviewed. Allergies and medications reviewed and updated.  Review of Systems  Constitutional:  Negative for chills and fever.  Eyes:  Negative for visual disturbance.  Respiratory:  Negative for shortness of breath and wheezing.   Cardiovascular:  Negative for chest pain and leg swelling.  Gastrointestinal:  Negative for blood in stool.  Genitourinary:  Negative for hematuria.  Musculoskeletal:  Negative for back pain and gait problem.  Skin:  Negative for rash.  Neurological:  Negative for dizziness and light-headedness.  All other systems reviewed and are negative.   Per HPI unless specifically indicated above   Allergies as of 04/13/2024       Reactions   Penicillins Hives, Rash   Has patient had a PCN reaction causing immediate rash, facial/tongue/throat swelling, SOB or lightheadedness with hypotension: Yes Has patient had a PCN reaction causing severe rash involving mucus membranes or skin necrosis: Yes Has patient had a PCN reaction that required hospitalization: No Has patient had a PCN reaction occurring within the last 10 years: No If all of the above answers are NO, then may proceed with Cephalosporin use.   Cetuximab  Cough   Cough and scratchy throat. Drug rechallenged and pt able to tolerated the rest of the infusion without any complications.    Hct  [hydrochlorothiazide] Other (See Comments)   hyper   Statins Other (See Comments)   Myalgia, tolerates low dosages of lovastatin          Medication List        Accurate as of April 13, 2024  9:32 AM. If you have any questions, ask your nurse or doctor.          acetaminophen  500 MG tablet Commonly known as: TYLENOL  Take 1,000 mg by mouth every 6 (six) hours as needed for moderate pain.   clindamycin  1 % gel Commonly known as: CLINDAGEL Apply 1 Application topically 2 (two) times daily.   diltiazem  120 MG 24 hr capsule Commonly known as: CARDIZEM  CD TAKE ONE CAPSULE BY MOUTH DAILY   ezetimibe  10 MG tablet Commonly known as: ZETIA  TAKE ONE (1) TABLET BY MOUTH EVERY DAY   finasteride  5 MG tablet Commonly known as: PROSCAR  TAKE ONE (1) TABLET EACH DAY   fluocinonide  cream 0.05 % Commonly known as: LIDEX  Apply 1 Application topically 2 (two) times daily.   fluticasone  50 MCG/ACT nasal spray Commonly known as: FLONASE  Place 1 spray into both nostrils daily as needed for allergies or rhinitis.   furosemide  20 MG tablet Commonly known as: LASIX  Take 1 tablet (20 mg total) by mouth daily.   lovastatin  40 MG tablet Commonly known as: MEVACOR  Take 1 tablet (40 mg total) by mouth at bedtime.   magnesium  oxide 400 (240 Mg) MG tablet Commonly known as: MAG-OX Take 1 tablet (400 mg  total) by mouth 2 (two) times daily.   methylphenidate  5 MG tablet Commonly known as: RITALIN  TAKE 1 TABLET BY MOUTH DAILY AS NEEDED   metoprolol  tartrate 50 MG tablet Commonly known as: LOPRESSOR  Take 1 tablet (50 mg total) by mouth 2 (two) times daily.   montelukast  10 MG tablet Commonly known as: SINGULAIR  Take 1 tablet (10 mg total) by mouth at bedtime.   potassium chloride  SA 20 MEQ tablet Commonly known as: KLOR-CON  M Take 1 tablet (20 mEq total) by mouth daily.   predniSONE  5 MG tablet Commonly known as: DELTASONE  Take 1 tablet (5 mg total) by mouth daily with breakfast.    tobramycin 0.3 % ophthalmic solution Commonly known as: TOBREX SMARTSIG:In Eye(s)   Vitamin D3 125 MCG (5000 UT) Caps Take 5,000 Units by mouth once a week.   warfarin 5 MG tablet Commonly known as: COUMADIN  Take as directed by the anticoagulation clinic. If you are unsure how to take this medication, talk to your nurse or doctor. Original instructions: TAKE 1 TABLET ON SATURDAY, SUNDAY, TUESDAY, & THURSDAY. TAKE 1/2 TABLET ON MONDAY, WEDNESDAY, & FRIDAY         Objective:   BP 134/74   Pulse (!) 139   Ht 5' 6.5 (1.689 m)   Wt 172 lb (78 kg)   SpO2 95%   BMI 27.35 kg/m   Wt Readings from Last 3 Encounters:  04/13/24 172 lb (78 kg)  04/10/24 172 lb 2.9 oz (78.1 kg)  03/27/24 171 lb 15.3 oz (78 kg)    Physical Exam Vitals and nursing note reviewed.  Constitutional:      General: He is not in acute distress.    Appearance: He is well-developed. He is not diaphoretic.  Eyes:     General: No scleral icterus.    Conjunctiva/sclera: Conjunctivae normal.  Neck:     Thyroid : No thyromegaly.  Cardiovascular:     Rate and Rhythm: Normal rate and regular rhythm.     Heart sounds: Normal heart sounds. No murmur heard. Pulmonary:     Effort: Pulmonary effort is normal. No respiratory distress.     Breath sounds: Normal breath sounds. No wheezing.  Musculoskeletal:        General: No swelling. Normal range of motion.     Cervical back: Neck supple.  Lymphadenopathy:     Cervical: No cervical adenopathy.  Skin:    General: Skin is warm and dry.     Findings: No rash.  Neurological:     Mental Status: He is alert and oriented to person, place, and time.     Coordination: Coordination normal.  Psychiatric:        Behavior: Behavior normal.       Assessment & Plan:   Problem List Items Addressed This Visit       Cardiovascular and Mediastinum   Atrial fibrillation, persistent (HCC) - Primary   Relevant Medications   lovastatin  (MEVACOR ) 40 MG tablet    warfarin (COUMADIN ) 5 MG tablet   Other Relevant Orders   CoaguChek XS/INR Waived     Other   Long term current use of anticoagulant therapy    Description   continue dose to take 1/2 tablet every day except Wednesday, take a whole tablet Wednesday.   INR today is  2.7 (goal is 2-3)    Recheck in 4 to 6 weeks weeks      Follow up plan: Return if symptoms worsen or fail to improve, for 6-week INR.  Counseling provided for all of the vaccine components Orders Placed This Encounter  Procedures   CoaguChek XS/INR Waived    Fonda Levins, MD [facility] 04/13/2024, 9:32 AM

## 2024-04-24 ENCOUNTER — Inpatient Hospital Stay

## 2024-04-24 ENCOUNTER — Inpatient Hospital Stay: Attending: Hematology

## 2024-04-24 ENCOUNTER — Inpatient Hospital Stay: Admitting: Hematology

## 2024-04-24 ENCOUNTER — Encounter: Payer: Self-pay | Admitting: Hematology

## 2024-04-24 VITALS — BP 134/93 | HR 69 | Temp 97.7°F | Resp 18

## 2024-04-24 VITALS — BP 110/54 | HR 75 | Temp 97.0°F | Resp 18

## 2024-04-24 DIAGNOSIS — Z95828 Presence of other vascular implants and grafts: Secondary | ICD-10-CM

## 2024-04-24 DIAGNOSIS — C760 Malignant neoplasm of head, face and neck: Secondary | ICD-10-CM | POA: Insufficient documentation

## 2024-04-24 DIAGNOSIS — E876 Hypokalemia: Secondary | ICD-10-CM | POA: Insufficient documentation

## 2024-04-24 DIAGNOSIS — C778 Secondary and unspecified malignant neoplasm of lymph nodes of multiple regions: Secondary | ICD-10-CM | POA: Insufficient documentation

## 2024-04-24 DIAGNOSIS — Z87891 Personal history of nicotine dependence: Secondary | ICD-10-CM | POA: Insufficient documentation

## 2024-04-24 DIAGNOSIS — Z7952 Long term (current) use of systemic steroids: Secondary | ICD-10-CM | POA: Insufficient documentation

## 2024-04-24 DIAGNOSIS — Z801 Family history of malignant neoplasm of trachea, bronchus and lung: Secondary | ICD-10-CM | POA: Insufficient documentation

## 2024-04-24 DIAGNOSIS — D72820 Lymphocytosis (symptomatic): Secondary | ICD-10-CM | POA: Insufficient documentation

## 2024-04-24 DIAGNOSIS — C9110 Chronic lymphocytic leukemia of B-cell type not having achieved remission: Secondary | ICD-10-CM

## 2024-04-24 DIAGNOSIS — R21 Rash and other nonspecific skin eruption: Secondary | ICD-10-CM | POA: Diagnosis not present

## 2024-04-24 DIAGNOSIS — G629 Polyneuropathy, unspecified: Secondary | ICD-10-CM | POA: Insufficient documentation

## 2024-04-24 DIAGNOSIS — R530 Neoplastic (malignant) related fatigue: Secondary | ICD-10-CM | POA: Insufficient documentation

## 2024-04-24 DIAGNOSIS — Z5189 Encounter for other specified aftercare: Secondary | ICD-10-CM | POA: Diagnosis not present

## 2024-04-24 DIAGNOSIS — C4442 Squamous cell carcinoma of skin of scalp and neck: Secondary | ICD-10-CM

## 2024-04-24 DIAGNOSIS — Z5111 Encounter for antineoplastic chemotherapy: Secondary | ICD-10-CM | POA: Diagnosis not present

## 2024-04-24 LAB — COMPREHENSIVE METABOLIC PANEL WITH GFR
ALT: 20 U/L (ref 0–44)
AST: 25 U/L (ref 15–41)
Albumin: 3.2 g/dL — ABNORMAL LOW (ref 3.5–5.0)
Alkaline Phosphatase: 71 U/L (ref 38–126)
Anion gap: 7 (ref 5–15)
BUN: 17 mg/dL (ref 8–23)
CO2: 27 mmol/L (ref 22–32)
Calcium: 8.5 mg/dL — ABNORMAL LOW (ref 8.9–10.3)
Chloride: 104 mmol/L (ref 98–111)
Creatinine, Ser: 0.92 mg/dL (ref 0.61–1.24)
GFR, Estimated: >60 mL/min (ref >60)
Glucose, Bld: 119 mg/dL — ABNORMAL HIGH (ref 70–99)
Potassium: 4.1 mmol/L (ref 3.5–5.1)
Sodium: 138 mmol/L (ref 135–145)
Total Bilirubin: 0.7 mg/dL (ref 0.0–1.2)
Total Protein: 5.9 g/dL — ABNORMAL LOW (ref 6.5–8.1)

## 2024-04-24 LAB — CBC WITH DIFFERENTIAL/PLATELET
Abs Immature Granulocytes: 0.08 K/uL — ABNORMAL HIGH (ref 0.00–0.07)
Basophils Absolute: 0.1 K/uL (ref 0.0–0.1)
Basophils Relative: 0 %
Eosinophils Absolute: 0.4 K/uL (ref 0.0–0.5)
Eosinophils Relative: 3 %
HCT: 41.8 % (ref 39.0–52.0)
Hemoglobin: 13.6 g/dL (ref 13.0–17.0)
Immature Granulocytes: 1 %
Lymphocytes Relative: 29 %
Lymphs Abs: 4.3 K/uL — ABNORMAL HIGH (ref 0.7–4.0)
MCH: 33.5 pg (ref 26.0–34.0)
MCHC: 32.5 g/dL (ref 30.0–36.0)
MCV: 103 fL — ABNORMAL HIGH (ref 80.0–100.0)
Monocytes Absolute: 0.9 K/uL (ref 0.1–1.0)
Monocytes Relative: 6 %
Neutro Abs: 9 K/uL — ABNORMAL HIGH (ref 1.7–7.7)
Neutrophils Relative %: 61 %
Platelets: 240 K/uL (ref 150–400)
RBC: 4.06 MIL/uL — ABNORMAL LOW (ref 4.22–5.81)
RDW: 13.2 % (ref 11.5–15.5)
WBC: 14.8 K/uL — ABNORMAL HIGH (ref 4.0–10.5)
nRBC: 0 % (ref 0.0–0.2)

## 2024-04-24 LAB — MAGNESIUM: Magnesium: 1.6 mg/dL — ABNORMAL LOW (ref 1.7–2.4)

## 2024-04-24 MED ORDER — METHYLPREDNISOLONE SODIUM SUCC 125 MG IJ SOLR
125.0000 mg | Freq: Once | INTRAMUSCULAR | Status: AC
  Administered 2024-04-24: 125 mg via INTRAVENOUS
  Filled 2024-04-24: qty 2

## 2024-04-24 MED ORDER — CETUXIMAB CHEMO IV INJECTION 200 MG/100ML
400.0000 mg/m2 | Freq: Once | INTRAVENOUS | Status: AC
  Administered 2024-04-24: 800 mg via INTRAVENOUS
  Filled 2024-04-24: qty 400

## 2024-04-24 MED ORDER — SODIUM CHLORIDE 0.9% FLUSH
10.0000 mL | INTRAVENOUS | Status: DC | PRN
  Administered 2024-04-24: 10 mL via INTRAVENOUS

## 2024-04-24 MED ORDER — MAGNESIUM SULFATE 2 GM/50ML IV SOLN
2.0000 g | Freq: Once | INTRAVENOUS | Status: AC
  Administered 2024-04-24: 2 g via INTRAVENOUS
  Filled 2024-04-24: qty 50

## 2024-04-24 MED ORDER — FAMOTIDINE IN NACL 20-0.9 MG/50ML-% IV SOLN
20.0000 mg | Freq: Once | INTRAVENOUS | Status: AC
  Administered 2024-04-24: 20 mg via INTRAVENOUS
  Filled 2024-04-24: qty 50

## 2024-04-24 MED ORDER — DIPHENHYDRAMINE HCL 50 MG/ML IJ SOLN
25.0000 mg | Freq: Once | INTRAMUSCULAR | Status: AC
  Administered 2024-04-24: 25 mg via INTRAVENOUS
  Filled 2024-04-24: qty 1

## 2024-04-24 MED ORDER — SODIUM CHLORIDE 0.9 % IV SOLN: INTRAVENOUS | Status: DC

## 2024-04-24 MED ORDER — SODIUM CHLORIDE 0.9% FLUSH
10.0000 mL | INTRAVENOUS | Status: DC | PRN
  Administered 2024-04-24: 10 mL

## 2024-04-24 MED ORDER — HEPARIN SOD (PORK) LOCK FLUSH 100 UNIT/ML IV SOLN
500.0000 [IU] | Freq: Once | INTRAVENOUS | Status: AC | PRN
  Administered 2024-04-24: 500 [IU]

## 2024-04-24 NOTE — Patient Instructions (Signed)
 [facility] - A DEPT OF [facility]  Discharge Instructions: Thank you for choosing [facility] to provide your oncology and hematology care.  If you have a lab appointment with the Cancer Center - please note that after April 8th, 2024, all labs will be drawn in the cancer center.  You do not have to check in or register with the main entrance as you have in the past but will complete your check-in in the cancer center.  Wear comfortable clothing and clothing appropriate for easy access to any Portacath or PICC line.   We strive to give you quality time with your provider. You may need to reschedule your appointment if you arrive late (15 or more minutes).  Arriving late affects you and other patients whose appointments are after yours.  Also, if you miss three or more appointments without notifying the office, you may be dismissed from the clinic at the provider's discretion.      For prescription refill requests, have your pharmacy contact our office and allow 72 hours for refills to be completed.    Today you received the following chemotherapy and/or immunotherapy agents Erbitux   To help prevent nausea and vomiting after your treatment, we encourage you to take your nausea medication as directed.  Cetuximab Injection What is this medication? CETUXIMAB (se TUX i mab) treats head and neck cancer. It may also be used to treat colorectal cancer. It works by blocking a protein that causes cancer cells to grow and multiply. This helps to slow or stop the spread of cancer cells. It is a monoclonal antibody. This medicine may be used for other purposes; ask your health care provider or pharmacist if you have questions. COMMON BRAND NAME(S): Erbitux What should I tell my care team before I take this medication? They need to know if you have any of these conditions: Heart disease History of tick bites Low levels of calcium, magnesium, or potassium in  the blood Lung disease Red meat allergy An unusual or allergic reaction to cetuximab, other medications, foods, dyes, or preservatives Pregnant or trying to get pregnant Breast-feeding How should I use this medication? This medication is infused into a vein. It is given by your care team in a hospital or clinic setting. Talk to your care team about the use of this medication in children. Special care may be needed. Overdosage: If you think you have taken too much of this medicine contact a poison control center or emergency room at once. NOTE: This medicine is only for you. Do not share this medicine with others. What if I miss a dose? Keep appointments for follow-up doses. It is important not to miss your dose. Call your care team if you are unable to keep an appointment. What may interact with this medication? Interactions are not expected. This list may not describe all possible interactions. Give your health care provider a list of all the medicines, herbs, non-prescription drugs, or dietary supplements you use. Also tell them if you smoke, drink alcohol, or use illegal drugs. Some items may interact with your medicine. What should I watch for while using this medication? Visit your care team for regular checks on your progress. This medication may make you feel generally unwell. This is not uncommon, as chemotherapy can affect healthy cells as well as cancer cells. Report any side effects. Continue your course of treatment even though you feel ill unless your care team tells you  to stop. You may need blood work done while you are taking this medication. This medication can make you more sensitive to the sun. Keep out of the sun while taking this medication and for 2 months after the last dose. If you cannot avoid being in the sun, wear protective clothing and sunscreen. Do not use sun lamps, tanning beds, or tanning booths. This medication can cause serious infusion reactions. To reduce the  risk, your care team may give you other medications to take before receiving this one. Be sure to follow the directions of your care team. This medication may cause serious skin reactions. They can happen weeks to months after starting the medication. Contact your care team right away if you notice fevers or flu-like symptoms with a rash. The rash may be red or purple and then turn into blisters or peeling of the skin. You may also notice a red rash with swelling of the face, lips, or lymph nodes in your neck or under your arms. Talk to your care team if you may be pregnant. Serious birth defects can occur if you take this medication during pregnancy and for 2 months after the last dose. You will need a negative pregnancy test before starting this medication. Contraception is recommended while taking this medication and for 2 months after the last dose. Your care team can help you find the option that works for you. Do not breastfeed while taking this medication and for 2 months after the last dose. This medication may cause infertility. Talk to your care team if you are concerned about your fertility. What side effects may I notice from receiving this medication? Side effects that you should report to your care team as soon as possible: Allergic reactions--skin rash, itching, hives, swelling of the face, lips, tongue, or throat Dry cough, shortness of breath or trouble breathing Heart attack--pain or tightness in the chest, shoulders, arms, or jaw, nausea, shortness of breath, cold or clammy skin, feeling faint or lightheaded Infusion reactions--chest pain, shortness of breath or trouble breathing, feeling faint or lightheaded Low calcium level--muscle pain or cramps, confusion, tingling, or numbness in the hands or feet Low magnesium level--muscle pain or cramps, unusual weakness or fatigue, fast or irregular heartbeat, tremors Low potassium level--muscle pain or cramps, unusual weakness or fatigue,  fast or irregular heartbeat, constipation Redness, blistering, peeling, or loosening of the skin, including inside the mouth Side effects that usually do not require medical attention (report to your care team if they continue or are bothersome): Diarrhea Headache Joint pain Nausea Unusual weakness or fatigue Weight loss This list may not describe all possible side effects. Call your doctor for medical advice about side effects. You may report side effects to FDA at 1-800-FDA-1088. Where should I keep my medication? This medication is given in a hospital or clinic. It will not be stored at home. NOTE: This sheet is a summary. It may not cover all possible information. If you have questions about this medicine, talk to your doctor, pharmacist, or health care provider.  2024 Elsevier/Gold Standard (2022-02-13 00:00:00)   BELOW ARE SYMPTOMS THAT SHOULD BE REPORTED IMMEDIATELY: *FEVER GREATER THAN 100.4 F (38 C) OR HIGHER *CHILLS OR SWEATING *NAUSEA AND VOMITING THAT IS NOT CONTROLLED WITH YOUR NAUSEA MEDICATION *UNUSUAL SHORTNESS OF BREATH *UNUSUAL BRUISING OR BLEEDING *URINARY PROBLEMS (pain or burning when urinating, or frequent urination) *BOWEL PROBLEMS (unusual diarrhea, constipation, pain near the anus) TENDERNESS IN MOUTH AND THROAT WITH OR WITHOUT PRESENCE OF ULCERS (sore  throat, sores in mouth, or a toothache) UNUSUAL RASH, SWELLING OR PAIN  UNUSUAL VAGINAL DISCHARGE OR ITCHING   Items with * indicate a potential emergency and should be followed up as soon as possible or go to the Emergency Department if any problems should occur.  Please show the CHEMOTHERAPY ALERT CARD or IMMUNOTHERAPY ALERT CARD at check-in to the Emergency Department and triage nurse.  Should you have questions after your visit or need to cancel or reschedule your appointment, please contact [facility] [place] - A DEPT OF [facility] [phone]  and follow the prompts.  Office  hours are 8:00 a.m. to 4:30 p.m. Monday - Friday. Please note that voicemails left after 4:00 p.m. may not be returned until the following business day.  We are closed weekends and major holidays. You have access to a nurse at all times for urgent questions. Please call the main number to the clinic [phone] and follow the prompts.  For any non-urgent questions, you may also contact your provider using MyChart. We now offer e-Visits for anyone 13 and older to request care online for non-urgent symptoms. For details visit [email or web address].   Also download the MyChart app! Go to the app store, search "MyChart", open the app, select [place], and log in with your MyChart username and password.

## 2024-04-24 NOTE — Progress Notes (Signed)
 Patients port flushed without difficulty.  Good blood return noted with no bruising or swelling noted at site.  Patient remains accessed for treatment.

## 2024-04-24 NOTE — Progress Notes (Signed)
 Patient presents today for Erbitux  infusion.  Patient is in satisfactory condition with no new complaints voiced.  Vital signs are stable.  Labs reviewed and all labs are within treatment parameters. Patient will receive 2g IV magnesium  sulfate per Dr.Katragadda's standing orders.  We will proceed with treatment per MD orders.   Treatment given today per MD orders. Tolerated infusion without adverse affects. Vital signs stable. No complaints at this time. Discharged from clinic via wheelchair in stable condition. Alert and oriented x 3. F/U with [facility] as scheduled.

## 2024-05-01 ENCOUNTER — Other Ambulatory Visit: Payer: Self-pay | Admitting: Family Medicine

## 2024-05-02 ENCOUNTER — Ambulatory Visit (HOSPITAL_COMMUNITY)
Admission: RE | Admit: 2024-05-02 | Discharge: 2024-05-02 | Disposition: A | Discharge status: Home / Self Care | Source: Ambulatory Visit | Attending: Hematology | Admitting: Hematology

## 2024-05-02 DIAGNOSIS — C7989 Secondary malignant neoplasm of other specified sites: Secondary | ICD-10-CM | POA: Insufficient documentation

## 2024-05-02 DIAGNOSIS — C771 Secondary and unspecified malignant neoplasm of intrathoracic lymph nodes: Secondary | ICD-10-CM | POA: Diagnosis not present

## 2024-05-02 DIAGNOSIS — C4492 Squamous cell carcinoma of skin, unspecified: Secondary | ICD-10-CM | POA: Diagnosis not present

## 2024-05-02 DIAGNOSIS — C3490 Malignant neoplasm of unspecified part of unspecified bronchus or lung: Secondary | ICD-10-CM | POA: Diagnosis not present

## 2024-05-02 DIAGNOSIS — C4442 Squamous cell carcinoma of skin of scalp and neck: Secondary | ICD-10-CM

## 2024-05-02 DIAGNOSIS — I6529 Occlusion and stenosis of unspecified carotid artery: Secondary | ICD-10-CM | POA: Diagnosis not present

## 2024-05-02 DIAGNOSIS — M47812 Spondylosis without myelopathy or radiculopathy, cervical region: Secondary | ICD-10-CM | POA: Diagnosis not present

## 2024-05-02 DIAGNOSIS — R599 Enlarged lymph nodes, unspecified: Secondary | ICD-10-CM | POA: Diagnosis not present

## 2024-05-02 DIAGNOSIS — C801 Malignant (primary) neoplasm, unspecified: Secondary | ICD-10-CM | POA: Diagnosis not present

## 2024-05-02 MED ORDER — IOHEXOL 300 MG/ML  SOLN
75.0000 mL | Freq: Once | INTRAMUSCULAR | Status: AC | PRN
  Administered 2024-05-02: 75 mL via INTRAVENOUS

## 2024-05-04 DIAGNOSIS — H02059 Trichiasis without entropian unspecified eye, unspecified eyelid: Secondary | ICD-10-CM | POA: Diagnosis not present

## 2024-05-04 DIAGNOSIS — H02051 Trichiasis without entropian right upper eyelid: Secondary | ICD-10-CM | POA: Diagnosis not present

## 2024-05-04 DIAGNOSIS — H18461 Peripheral corneal degeneration, right eye: Secondary | ICD-10-CM | POA: Diagnosis not present

## 2024-05-08 ENCOUNTER — Inpatient Hospital Stay: Admitting: Hematology

## 2024-05-08 ENCOUNTER — Inpatient Hospital Stay

## 2024-05-08 VITALS — BP 111/70 | HR 73 | Temp 96.9°F | Resp 20

## 2024-05-08 VITALS — Wt 171.0 lb

## 2024-05-08 DIAGNOSIS — E876 Hypokalemia: Secondary | ICD-10-CM | POA: Diagnosis not present

## 2024-05-08 DIAGNOSIS — C4442 Squamous cell carcinoma of skin of scalp and neck: Secondary | ICD-10-CM

## 2024-05-08 DIAGNOSIS — Z5189 Encounter for other specified aftercare: Secondary | ICD-10-CM | POA: Diagnosis not present

## 2024-05-08 DIAGNOSIS — G629 Polyneuropathy, unspecified: Secondary | ICD-10-CM | POA: Diagnosis not present

## 2024-05-08 DIAGNOSIS — R21 Rash and other nonspecific skin eruption: Secondary | ICD-10-CM | POA: Diagnosis not present

## 2024-05-08 DIAGNOSIS — Z95828 Presence of other vascular implants and grafts: Secondary | ICD-10-CM

## 2024-05-08 DIAGNOSIS — Z87891 Personal history of nicotine dependence: Secondary | ICD-10-CM | POA: Diagnosis not present

## 2024-05-08 DIAGNOSIS — Z801 Family history of malignant neoplasm of trachea, bronchus and lung: Secondary | ICD-10-CM | POA: Diagnosis not present

## 2024-05-08 DIAGNOSIS — Z7952 Long term (current) use of systemic steroids: Secondary | ICD-10-CM | POA: Diagnosis not present

## 2024-05-08 DIAGNOSIS — C760 Malignant neoplasm of head, face and neck: Secondary | ICD-10-CM | POA: Diagnosis not present

## 2024-05-08 DIAGNOSIS — Z5111 Encounter for antineoplastic chemotherapy: Secondary | ICD-10-CM | POA: Diagnosis not present

## 2024-05-08 DIAGNOSIS — D72820 Lymphocytosis (symptomatic): Secondary | ICD-10-CM | POA: Diagnosis not present

## 2024-05-08 DIAGNOSIS — C9110 Chronic lymphocytic leukemia of B-cell type not having achieved remission: Secondary | ICD-10-CM

## 2024-05-08 DIAGNOSIS — C778 Secondary and unspecified malignant neoplasm of lymph nodes of multiple regions: Secondary | ICD-10-CM | POA: Diagnosis not present

## 2024-05-08 DIAGNOSIS — R530 Neoplastic (malignant) related fatigue: Secondary | ICD-10-CM | POA: Diagnosis not present

## 2024-05-08 LAB — COMPREHENSIVE METABOLIC PANEL WITH GFR
ALT: 19 U/L (ref 0–44)
AST: 26 U/L (ref 15–41)
Albumin: 3.2 g/dL — ABNORMAL LOW (ref 3.5–5.0)
Alkaline Phosphatase: 64 U/L (ref 38–126)
Anion gap: 8 (ref 5–15)
BUN: 16 mg/dL (ref 8–23)
CO2: 27 mmol/L (ref 22–32)
Calcium: 8.5 mg/dL — ABNORMAL LOW (ref 8.9–10.3)
Chloride: 107 mmol/L (ref 98–111)
Creatinine, Ser: 0.99 mg/dL (ref 0.61–1.24)
GFR, Estimated: >60 mL/min (ref >60)
Glucose, Bld: 111 mg/dL — ABNORMAL HIGH (ref 70–99)
Potassium: 3.7 mmol/L (ref 3.5–5.1)
Sodium: 142 mmol/L (ref 135–145)
Total Bilirubin: 0.7 mg/dL (ref 0.0–1.2)
Total Protein: 5.8 g/dL — ABNORMAL LOW (ref 6.5–8.1)

## 2024-05-08 LAB — CBC WITH DIFFERENTIAL/PLATELET
Abs Immature Granulocytes: 0.09 K/uL — ABNORMAL HIGH (ref 0.00–0.07)
Basophils Absolute: 0.1 K/uL (ref 0.0–0.1)
Basophils Relative: 0 %
Eosinophils Absolute: 0.4 K/uL (ref 0.0–0.5)
Eosinophils Relative: 2 %
HCT: 41.5 % (ref 39.0–52.0)
Hemoglobin: 13.7 g/dL (ref 13.0–17.0)
Immature Granulocytes: 1 %
Lymphocytes Relative: 29 %
Lymphs Abs: 4.5 K/uL — ABNORMAL HIGH (ref 0.7–4.0)
MCH: 33.7 pg (ref 26.0–34.0)
MCHC: 33 g/dL (ref 30.0–36.0)
MCV: 102.2 fL — ABNORMAL HIGH (ref 80.0–100.0)
Monocytes Absolute: 0.9 K/uL (ref 0.1–1.0)
Monocytes Relative: 6 %
Neutro Abs: 9.9 K/uL — ABNORMAL HIGH (ref 1.7–7.7)
Neutrophils Relative %: 62 %
Platelets: 218 K/uL (ref 150–400)
RBC: 4.06 MIL/uL — ABNORMAL LOW (ref 4.22–5.81)
RDW: 13.2 % (ref 11.5–15.5)
WBC: 15.9 K/uL — ABNORMAL HIGH (ref 4.0–10.5)
nRBC: 0 % (ref 0.0–0.2)

## 2024-05-08 LAB — MAGNESIUM: Magnesium: 1.4 mg/dL — ABNORMAL LOW (ref 1.7–2.4)

## 2024-05-08 MED ORDER — MAGNESIUM SULFATE 4 GM/100ML IV SOLN
4.0000 g | Freq: Once | INTRAVENOUS | Status: AC
  Administered 2024-05-08: 4 g via INTRAVENOUS
  Filled 2024-05-08: qty 100

## 2024-05-08 MED ORDER — SODIUM CHLORIDE 0.9 % IV SOLN
400.0000 mg | Freq: Once | INTRAVENOUS | Status: AC
  Administered 2024-05-08: 400 mg via INTRAVENOUS
  Filled 2024-05-08: qty 40

## 2024-05-08 MED ORDER — SODIUM CHLORIDE 0.9% FLUSH
10.0000 mL | INTRAVENOUS | Status: DC | PRN
  Administered 2024-05-08: 10 mL via INTRAVENOUS

## 2024-05-08 MED ORDER — SODIUM CHLORIDE 0.9 % IV SOLN
150.0000 mg | Freq: Once | INTRAVENOUS | Status: AC
  Administered 2024-05-08: 150 mg via INTRAVENOUS
  Filled 2024-05-08: qty 150

## 2024-05-08 MED ORDER — MAGNESIUM OXIDE -MG SUPPLEMENT 400 (240 MG) MG PO TABS
400.0000 mg | ORAL_TABLET | Freq: Two times a day (BID) | ORAL | 3 refills | Status: AC
Start: 2024-05-08 — End: ?

## 2024-05-08 MED ORDER — PALONOSETRON HCL INJECTION 0.25 MG/5ML
0.2500 mg | Freq: Once | INTRAVENOUS | Status: AC
  Administered 2024-05-08: 0.25 mg via INTRAVENOUS
  Filled 2024-05-08: qty 5

## 2024-05-08 MED ORDER — SODIUM CHLORIDE 0.9% FLUSH
10.0000 mL | INTRAVENOUS | Status: DC | PRN
Start: 2024-05-08 — End: 2024-05-08
  Administered 2024-05-08: 10 mL

## 2024-05-08 MED ORDER — SODIUM CHLORIDE 0.9 % IV SOLN: INTRAVENOUS | Status: DC

## 2024-05-08 MED ORDER — FAMOTIDINE IN NACL 20-0.9 MG/50ML-% IV SOLN
20.0000 mg | Freq: Once | INTRAVENOUS | Status: AC
  Administered 2024-05-08: 20 mg via INTRAVENOUS
  Filled 2024-05-08: qty 50

## 2024-05-08 MED ORDER — PROCHLORPERAZINE MALEATE 10 MG PO TABS: 10.0000 mg | ORAL_TABLET | Freq: Four times a day (QID) | ORAL | 2 refills | Status: AC | PRN

## 2024-05-08 MED ORDER — HEPARIN SOD (PORK) LOCK FLUSH 100 UNIT/ML IV SOLN
500.0000 [IU] | Freq: Once | INTRAVENOUS | Status: AC | PRN
  Administered 2024-05-08: 500 [IU]

## 2024-05-08 MED ORDER — CETIRIZINE HCL 10 MG/ML IV SOLN
10.0000 mg | Freq: Once | INTRAVENOUS | Status: AC
  Administered 2024-05-08: 10 mg via INTRAVENOUS
  Filled 2024-05-08: qty 1

## 2024-05-08 MED ORDER — DEXAMETHASONE SODIUM PHOSPHATE 10 MG/ML IJ SOLN
10.0000 mg | Freq: Once | INTRAMUSCULAR | Status: AC
  Administered 2024-05-08: 10 mg via INTRAVENOUS
  Filled 2024-05-08: qty 1

## 2024-05-08 NOTE — Progress Notes (Signed)
 DISCONTINUE ON PATHWAY REGIMEN - Head and Neck     A cycle is every 14 days:     Cetuximab    **Always confirm dose/schedule in your pharmacy ordering system**  PRIOR TREATMENT: [id number]: Cetuximab  500 mg/m2 q14 Days  START OFF PATHWAY REGIMEN - Head and Neck   Custom Intervention:carboplatin :     Carboplatin    **Always confirm dose/schedule in your pharmacy ordering system**  Patient Characteristics: Occult Primary, Metastatic, Third Line Disease Classification: Occult Primary Current Disease Status: Metastatic Disease AJCC T Category: T0 AJCC N Category: pN3b AJCC M Category: M1 AJCC Stage Grouping: IVC Line of Therapy: Third Line Intent of Therapy: Non-Curative / Palliative Intent, Discussed with Patient

## 2024-05-08 NOTE — Progress Notes (Signed)
 [facility] [address] [place] [address]    Clinic Day:  05/08/2024  Referring physician: [name], Fonda LABOR, MD  Patient Care Team: [name], Fonda LABOR, MD as PCP - General (Family Medicine) [name], MD as PCP - Cardiology (Cardiology) Watt Rush, MD as Attending Physician (Urology) [name], MD as Consulting Physician (Cardiology) Golda Claudis PENNER, MD (Inactive) as Consulting Physician (Gastroenterology) Perley Hamilton, MD as Consulting Physician (Ophthalmology) Heide Ingle, MD as Consulting Physician (Orthopedic Surgery) Frances, Ozell RAMAN, LCSW as [facility] Care Management (Licensed Clinical Social Worker) Rogers Hai, MD as Medical Oncologist (Medical Oncology) Celestia Joesph SQUIBB, RN as Oncology Nurse Navigator (Medical Oncology)   ASSESSMENT & PLAN:   Assessment: 1.  Right supraclavicular lymphadenopathy and multiple lung nodules: - Patient noticed right neck mass since Christmas 2022.  10 to 15 pound weight loss since January 2023.  Denies any dysphagia or odynophagia. - CT neck (05/27/2022): Numerous lymph nodes in the right neck, right supraclavicular lymph node mass measures 5.7 x 3.7 cm.  18 mm posterior lymph node in the right lower neck.  Numerous lymph nodes in the right lower and posterior neck approximately 1 cm.  Many have internal necrosis consistent with metastatic disease.  20 mm lymph node just above the right clavicle.  Left level 2 node 12 mm. - CT CAP (05/28/2022): Several bilateral lung nodules, RUL nodule measuring 1.8 x 1.3 cm.  Small clusters of nodules in the medial left upper lobe.  Irregular nodular density along the left side of the mediastinum measuring 2.1 cm.  No metastatic disease in the abdomen or pelvis.  Liver is slightly nodular contour. - Lab work shows elevated white count since 2009, predominantly lymphocytosis. - Pathology (06/09/2022): Metastatic carcinoma positive for p40, p63  and CK5/6-patchy positivity with CK7.  Negative for TTF-1, Napsin A, PAX8, prostein, PSA, CK20 and CDX2.  - PET scan (06/11/2022): Bulky right supraclavicular nodal mass, smaller hypermetabolic right posterior triangle and jugular lymph nodes and single hypermetabolic left level 3 lymph node.  Bilateral hypermetabolic pulmonary nodules and metastatic pattern.  Minimal hypermetabolic mediastinal nodal metastasis.  Cluster of hypermetabolic right axillary lymph nodes.  No evidence of metastatic disease or primary lesion in the abdomen or pelvis.  No bone lesions. - MRI of the brain: Chronic small vessel ischemic changes with no evidence of metastatic disease. - NGS: T p53 pathogenic variant, MSI-stable, TMB-low, LOH-low, PD-L1 (SP142): Negative, p16 and p18 negative.  HER2 by IHC 0. - PD-L1 22 C3: CPS score 25 - Cancer type ID: 96% probability squamous cell carcinoma, subtype head and neck/skin.  Cancer types ruled out with 95% confidence includes skin basal cell carcinoma. - Carboplatin , Taxol  and pembrolizumab  07/23/2022 through 11/09/22 - Right axillary lymph node biopsy (08/04/2023): Metastatic moderate to poorly differentiated squamous cell carcinoma. - Cetuximab  every 2 weeks started on 10/25/2023, discontinued on 05/08/2024 due to progression. - Single agent carboplatin  (AUC 5) every 21 days started on 05/08/2024   2.  Social/family history: - He lives at home with his wife.  Independent of ADLs and IADLs.  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  May have had asbestos exposure 30 years ago.  Quit smoking cigarettes in 1965.  Smoked 1 pack/day for less than 12 years.  2 of his brothers had lung cancer and both were smokers.  3.  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  Plan: 1.  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  Rest of the disease is stable. - He is experiencing side effects including decrease in energy levels and skin rash.  He also had to have 9 eyelashes pulled out last Tuesday. - Given the findings on the scans, I have recommended discontinuing cetuximab . - Treatment options include single agent carboplatin , taxane, methotrexate.  He did not have any progression on carboplatin  or Taxol  in the past.  He does have neuropathy.  I would like to not consider paclitaxel  because of that.  We discussed carboplatin  AUC 5 every 21 days with growth factor support given his advanced age.  We discussed side effects in detail.  He is agreeable.  He will start cycle 1 today.  RTC 3 weeks for follow-up.   2.  EGFR antibody induced rash: - Continue doxycycline  twice daily and clindamycin  gel for a month and then stop it.   3.  Cancer-related fatigue: - Continue Ritalin  5 mg in the mornings as needed for fatigue.  4.  Polyarthralgias: - Continue prednisone  5 mg daily for arthralgias.  5.  Hypomagnesemia: - He was taking magnesium  twice daily and ran out about a week ago.  Magnesium  is low at 1.4.  He will receive IV magnesium .  He will start back on magnesium  twice daily.  6.  Hypokalemia: - Continue K-Dur 20 milliequivalents daily.  Potassium is normal.    No orders of the defined types were placed in this encounter.    LILLETTE Verneta SAUNDERS Teague,acting as a Neurosurgeon for Alean Stands, MD.,have documented all relevant documentation on the behalf of Alean Stands, MD,as directed by  Alean Stands, MD while in the presence of Alean Stands, MD.  I, Alean Stands MD, have reviewed the above documentation for accuracy and completeness, and I agree with the above.       Alean Stands, MD   7/28/20254:39 PM  CHIEF COMPLAINT:   Diagnosis: metastatic  squamous cell carcinoma    Cancer Staging  Squamous cell carcinoma of head and neck Staging form: Cervical Lymph Nodes and Unknown Primary Tumors of the Head and Neck, AJCC 8th Edition - Clinical stage from 07/16/2022: Stage IVC (cT0, cN3b, cM1) - Unsigned    Prior Therapy: Carboplatin , paclitaxel  and pembrolizumab , 07/23/22 - 11/09/22   Current Therapy: Cetuximab    HISTORY OF PRESENT ILLNESS:   Oncology History  Squamous cell carcinoma of head and neck  07/16/2022 Initial Diagnosis   Squamous cell carcinoma of head and neck (HCC)   07/23/2022 - 09/27/2023 Chemotherapy   Patient is on Treatment Plan : Carboplatin   + Paclitaxel   + Pembrolizumab  (200) D1 q21d     10/25/2023 - 04/24/2024 Chemotherapy   Patient is on Treatment Plan : HEAD/NECK Cetuximab  q14d     05/08/2024 -  Chemotherapy   Patient is on Treatment Plan : Carboplatin  (AUC 5) q21d        INTERVAL HISTORY:   [name] is a 89 y.o. male presenting to clinic today for follow up of metastatic squamous cell carcinoma. He was last seen by me on 04/10/2024.  Since his last visit, he underwent CT chest on 05/02/2024 that found: Progressive right axillary nodal metastasis. Additional thoracic nodal metastases, stable versus mildly improved.    CT soft tissue neck done on 05/02/2024 showed:  Further slight enlargement of lymph nodes  in the right level 3/level 4 region and in the lower level 3 region. Stable appearance of lymph nodes on the left. Question of vocal fold paresis on the right, possibly with a small mass or polyp along the under surface of the right vocal fold. Atherosclerotic change at the carotid bifurcation regions as seen previously with potential for flow limiting disease.  Today, he states that he is doing well overall. His appetite level is at 75%. His energy level is at 20%.  He is accompanied by his wife.  PAST MEDICAL HISTORY:   Past Medical History: Past Medical History:  Diagnosis Date   Anemia    Atrial  fibrillation (HCC)    Cataract    Cellulitis    In past   Complication of anesthesia    HARD TIME WAKING; CAUSES MY BP TO GO UP    Coronary artery disease    5 bypasses   DJD (degenerative joint disease)    Ejection fraction < 50%    Mildly reduced, 40% by echo   GERD (gastroesophageal reflux disease)    Hyperlipidemia    Hypertension    Persistent atrial fibrillation (HCC)    Prostate hypertrophy    on CT scan 09/2014   PUD (peptic ulcer disease)    RESOLVED    Skin cancer of eyelid    Resected    Surgical History: Past Surgical History:  Procedure Laterality Date   APPENDECTOMY     BYPASS GRAFT  2005   CATARACT EXTRACTION W/PHACO Left 07/22/2015   Procedure: CATARACT EXTRACTION PHACO AND INTRAOCULAR LENS PLACEMENT LEFT EYE CDE=8.14;  Surgeon: Cherene Mania, MD;  Location: [facility];  Service: Ophthalmology;  Laterality: Left;   CATARACT EXTRACTION W/PHACO Right 08/18/2019   Procedure: CATARACT EXTRACTION PHACO AND INTRAOCULAR LENS PLACEMENT (IOC);  Surgeon: Harrie Agent, MD;  Location: [facility];  Service: Ophthalmology;  Laterality: Right;  CDE: 9.74   CORONARY ARTERY BYPASS GRAFT  4/05   LIMA to LAD, SVG to PDA, SVG to ramus intermediate   EYE SURGERY  07/2015   EYELID CARCINOMA EXCISION     Skin cancer resected   Heart bypass  2005   LYMPH NODE BIOPSY Right 06/09/2022   Procedure: LYMPH NODE BIOPSY; supraclavicular;  Surgeon: Kallie Manuelita BROCKS, MD;  Location: [facility];  Service: General;  Laterality: Right;   PORTACATH PLACEMENT Left 06/09/2022   Procedure: INSERTION PORT-A-CATH;  Surgeon: Kallie Manuelita BROCKS, MD;  Location: [facility];  Service: General;  Laterality: Left;   TOTAL HIP ARTHROPLASTY     Right   TOTAL HIP ARTHROPLASTY Left 05/04/2018   Procedure: LEFT TOTAL HIP ARTHROPLASTY;  Surgeon: Heide Ingle, MD;  Location: [facility];  Service: Orthopedics;  Laterality: Left;   TOTAL KNEE ARTHROPLASTY     Right    Social History: Social History   Socioeconomic History    Marital status: Married    Spouse name: [name]   Number of children: 1   Years of education: 10   Highest education level: 10th grade  Occupational History   Occupation: retired  Tobacco Use   Smoking status: Former    Current packs/day: 0.00    Average packs/day: 2.0 packs/day for 5.0 years (10.0 ttl pk-yrs)    Types: Cigarettes    Start date: 12/23/1958    Quit date: 12/23/1963    Years since quitting: 60.4   Smokeless tobacco: Never  Vaping Use   Vaping status: Never Used  Substance and Sexual Activity   Alcohol  use: No  Drug use: No   Sexual activity: Yes  Other Topics Concern   Not on file  Social History Narrative   Lives in a split level home.   Social Drivers of Corporate investment banker Strain: Low Risk  (04/09/2024)   Overall Financial Resource Strain (CARDIA)    Difficulty of Paying Living Expenses: Not hard at all  Food Insecurity: No Food Insecurity (04/09/2024)   Hunger Vital Sign    Worried About Running Out of Food in the Last Year: Never true    Ran Out of Food in the Last Year: Never true  Transportation Needs: No Transportation Needs (04/09/2024)   PRAPARE - Administrator, Civil Service (Medical): No    Lack of Transportation (Non-Medical): No  Physical Activity: Inactive (04/09/2024)   Exercise Vital Sign    Days of Exercise per Week: 0 days    Minutes of Exercise per Session: Not on file  Stress: No Stress Concern Present (04/09/2024)   [employer] of Occupational Health - Occupational Stress Questionnaire    Feeling of Stress: Only a little  Social Connections: Moderately Isolated (04/09/2024)   Social Connection and Isolation Panel    Frequency of Communication with Friends and Family: More than three times a week    Frequency of Social Gatherings with Friends and Family: More than three times a week    Attends Religious Services: Never    Database administrator or Organizations: No    Attends Engineer, structural: Not  on file    Marital Status: Married  Catering manager Violence: Not At Risk (01/06/2023)   Humiliation, Afraid, Rape, and Kick questionnaire    Fear of Current or Ex-Partner: No    Emotionally Abused: No    Physically Abused: No    Sexually Abused: No    Family History: Family History  Problem Relation Age of Onset   Stroke Mother    Stroke Father    Hypertension Father    Early death Brother        67 months old   Cancer Brother        lung   Stroke Brother        heat    Current Medications:  Current Outpatient Medications:    acetaminophen  (TYLENOL ) 500 MG tablet, Take 1,000 mg by mouth every 6 (six) hours as needed for moderate pain., Disp: , Rfl:    Cholecalciferol (VITAMIN D3) 5000 units CAPS, Take 5,000 Units by mouth once a week. , Disp: , Rfl:    clindamycin  (CLINDAGEL) 1 % gel, Apply 1 Application topically 2 (two) times daily., Disp: , Rfl:    diltiazem  (CARDIZEM  CD) 120 MG 24 hr capsule, TAKE ONE CAPSULE BY MOUTH DAILY, Disp: 90 capsule, Rfl: 0   doxycycline  (VIBRA -TABS) 100 MG tablet, Take 100 mg by mouth 2 (two) times daily., Disp: , Rfl:    ezetimibe  (ZETIA ) 10 MG tablet, TAKE ONE (1) TABLET BY MOUTH EVERY DAY, Disp: 90 tablet, Rfl: 0   finasteride  (PROSCAR ) 5 MG tablet, TAKE ONE (1) TABLET EACH DAY, Disp: 90 tablet, Rfl: 3   fluocinonide  cream (LIDEX ) 0.05 %, Apply 1 Application topically 2 (two) times daily., Disp: 60 g, Rfl: 3   fluticasone  (FLONASE ) 50 MCG/ACT nasal spray, Place 1 spray into both nostrils daily as needed for allergies or rhinitis., Disp: , Rfl:    furosemide  (LASIX ) 20 MG tablet, Take 1 tablet (20 mg total) by mouth daily., Disp: 90 tablet, Rfl: 3  lovastatin  (MEVACOR ) 40 MG tablet, Take 1 tablet (40 mg total) by mouth at bedtime., Disp: 90 tablet, Rfl: 3   methylphenidate  (RITALIN ) 5 MG tablet, TAKE 1 TABLET BY MOUTH DAILY AS NEEDED, Disp: 30 tablet, Rfl: 0   metoprolol  tartrate (LOPRESSOR ) 50 MG tablet, Take 1 tablet (50 mg total) by mouth 2  (two) times daily., Disp: 180 tablet, Rfl: 3   montelukast  (SINGULAIR ) 10 MG tablet, Take 1 tablet (10 mg total) by mouth at bedtime., Disp: 90 tablet, Rfl: 3   potassium chloride  SA (KLOR-CON  M) 20 MEQ tablet, Take 1 tablet (20 mEq total) by mouth daily., Disp: 30 tablet, Rfl: 3   predniSONE  (DELTASONE ) 5 MG tablet, Take 1 tablet (5 mg total) by mouth daily with breakfast., Disp: 90 tablet, Rfl: 2   tobramycin (TOBREX) 0.3 % ophthalmic solution, SMARTSIG:In Eye(s), Disp: , Rfl:    warfarin (COUMADIN ) 5 MG tablet, TAKE 1 TABLET ON SATURDAY, SUNDAY, TUESDAY, & THURSDAY. TAKE 1/2 TABLET ON MONDAY, WEDNESDAY, & FRIDAY, Disp: 70 tablet, Rfl: 3   magnesium  oxide (MAG-OX) 400 (240 Mg) MG tablet, Take 1 tablet (400 mg total) by mouth 2 (two) times daily., Disp: 60 tablet, Rfl: 3   prochlorperazine  (COMPAZINE ) 10 MG tablet, Take 1 tablet (10 mg total) by mouth every 6 (six) hours as needed for nausea or vomiting., Disp: 30 tablet, Rfl: 2 No current facility-administered medications for this visit.  Facility-Administered Medications Ordered in Other Visits:    0.9 %  sodium chloride  infusion, , Intravenous, Continuous, Rogers Hai, MD, Stopped at 05/08/24 1526   0.9 %  sodium chloride  infusion, , Intravenous, Continuous, Rogers Hai, MD   sodium chloride  flush (NS) 0.9 % injection 10 mL, 10 mL, Intracatheter, PRN, Hal Norrington, MD, 10 mL at 05/08/24 1526   Allergies: Allergies  Allergen Reactions   Penicillins Hives and Rash    Has patient had a PCN reaction causing immediate rash, facial/tongue/throat swelling, SOB or lightheadedness with hypotension: Yes Has patient had a PCN reaction causing severe rash involving mucus membranes or skin necrosis: Yes Has patient had a PCN reaction that required hospitalization: No Has patient had a PCN reaction occurring within the last 10 years: No If all of the above answers are NO, then may proceed with Cephalosporin use.     Cetuximab  Cough    Cough and scratchy throat. Drug rechallenged and pt able to tolerated the rest of the infusion without any complications.    Hct [Hydrochlorothiazide] Other (See Comments)    hyper   Statins Other (See Comments)    Myalgia, tolerates low dosages of lovastatin      REVIEW OF SYSTEMS:   Review of Systems  Constitutional:  Negative for chills, fatigue and fever.  HENT:   Negative for lump/mass, mouth sores, nosebleeds, sore throat and trouble swallowing.   Eyes:  Negative for eye problems.  Respiratory:  Negative for cough and shortness of breath.   Cardiovascular:  Negative for chest pain, leg swelling and palpitations.  Gastrointestinal:  Negative for abdominal pain, constipation, diarrhea, nausea and vomiting.  Genitourinary:  Negative for bladder incontinence, difficulty urinating, dysuria, frequency, hematuria and nocturia.   Musculoskeletal:  Negative for arthralgias, back pain, flank pain, myalgias and neck pain.  Skin:  Negative for itching and rash.  Neurological:  Positive for dizziness, headaches and numbness.  Hematological:  Does not bruise/bleed easily.  Psychiatric/Behavioral:  Negative for depression, sleep disturbance and suicidal ideas. The patient is not nervous/anxious.   All other systems reviewed and  are negative.    VITALS:   Weight 171 lb (77.6 kg).  Wt Readings from Last 3 Encounters:  05/08/24 171 lb (77.6 kg)  04/13/24 172 lb (78 kg)  04/10/24 172 lb 2.9 oz (78.1 kg)    Body mass index is 27.19 kg/m.  Performance status (ECOG): 1 - Symptomatic but completely ambulatory  PHYSICAL EXAM:   Physical Exam Vitals and nursing note reviewed. Exam conducted with a chaperone present.  Constitutional:      Appearance: Normal appearance.  Cardiovascular:     Rate and Rhythm: Normal rate and regular rhythm.     Pulses: Normal pulses.     Heart sounds: Normal heart sounds.  Pulmonary:     Effort: Pulmonary effort is normal.     Breath  sounds: Normal breath sounds.  Abdominal:     Palpations: Abdomen is soft. There is no hepatomegaly, splenomegaly or mass.     Tenderness: There is no abdominal tenderness.  Musculoskeletal:     Right lower leg: No edema.     Left lower leg: No edema.  Lymphadenopathy:     Cervical: No cervical adenopathy.     Right cervical: No superficial, deep or posterior cervical adenopathy.    Left cervical: No superficial, deep or posterior cervical adenopathy.     Upper Body:     Right upper body: No supraclavicular or axillary adenopathy.     Left upper body: No supraclavicular or axillary adenopathy.  Neurological:     General: No focal deficit present.     Mental Status: He is alert and oriented to person, place, and time.  Psychiatric:        Mood and Affect: Mood normal.        Behavior: Behavior normal.     LABS:      Latest Ref Rng & Units 05/08/2024    9:51 AM 04/24/2024   10:23 AM 04/10/2024    9:31 AM  CBC  WBC 4.0 - 10.5 K/uL 15.9  14.8  14.7   Hemoglobin 13.0 - 17.0 g/dL 86.2  86.3  86.2   Hematocrit 39.0 - 52.0 % 41.5  41.8  41.6   Platelets 150 - 400 K/uL 218  240  211       Latest Ref Rng & Units 05/08/2024    9:51 AM 04/24/2024   10:23 AM 04/10/2024    9:31 AM  CMP  Glucose 70 - 99 mg/dL 888  880  862   BUN 8 - 23 mg/dL 16  17  19    Creatinine 0.61 - 1.24 mg/dL 9.00  9.07  9.01   Sodium 135 - 145 mmol/L 142  138  138   Potassium 3.5 - 5.1 mmol/L 3.7  4.1  4.2   Chloride 98 - 111 mmol/L 107  104  104   CO2 22 - 32 mmol/L 27  27  26    Calcium  8.9 - 10.3 mg/dL 8.5  8.5  8.8   Total Protein 6.5 - 8.1 g/dL 5.8  5.9  6.2   Total Bilirubin 0.0 - 1.2 mg/dL 0.7  0.7  0.7   Alkaline Phos 38 - 126 U/L 64  71  67   AST 15 - 41 U/L 26  25  20    ALT 0 - 44 U/L 19  20  18       Lab Results  Component Value Date   CEA1 5.4 (H) 06/03/2022   /  CEA  Date Value Ref Range Status  06/03/2022  5.4 (H) 0.0 - 4.7 ng/mL Final    Comment:    (NOTE)                              Nonsmokers          <3.9                             Smokers             <5.6 Roche [employer] Electrochemiluminescence Immunoassay (ECLIA) Values obtained with different assay methods or kits cannot be used interchangeably.  Results cannot be interpreted as absolute evidence of the presence or absence of malignant disease. Performed At: [facility] [address] [place], [place] [id number] Jennette Shorter MD Ey:[id number]    Lab Results  Component Value Date   PSA1 9.5 (H) 03/01/2024   Lab Results  Component Value Date   CAN199 78 (H) 06/03/2022   No results found for: CAN125  No results found for: TOTALPROTELP, ALBUMINELP, A1GS, A2GS, BETS, BETA2SER, GAMS, MSPIKE, SPEI No results found for: TIBC, FERRITIN, IRONPCTSAT Lab Results  Component Value Date   LDH 164 06/03/2022     STUDIES:   CT SOFT TISSUE NECK W CONTRAST Result Date: 05/05/2024 CLINICAL DATA:  Follow-up squamous cell carcinoma of the head neck. EXAM: CT NECK WITH CONTRAST TECHNIQUE: Multidetector CT imaging of the neck was performed using the standard protocol following the bolus administration of intravenous contrast. RADIATION DOSE REDUCTION: This exam was performed according to the departmental dose-optimization program which includes automated exposure control, adjustment of the mA and/or kV according to patient size and/or use of iterative reconstruction technique. CONTRAST:  75mL OMNIPAQUE  IOHEXOL  300 MG/ML  SOLN COMPARISON:  03/07/2024.  12/30/2023.  10/12/2023. FINDINGS: Pharynx and larynx: No definite submucosal or mucosal mass lesion. Again there is question of vocal fold paresis on the right, possibly with a small mass or polyp along the under surface of the right vocal fold. Salivary glands: Parotid and submandibular glands are negative. Chronic atrophic changes of the submandibular glands. Thyroid : No significant thyroid  finding. Lymph nodes: On the left, there is no  change in the appearance of level 2 level 3 junction lymph nodes. Largest node again measures 14 x 18 mm. Two adjacent level 3/level 4 lymph nodes in the region of axial image 63 are unchanged, short axis dimensions 7 and 5 mm as seen previously. Small supraclavicular lymph nodes in the region of image 73 are stable with short axis dimension 5-6 mm. On the right, 1 or 2 adjacent nodes in the level 3/level 4 region measures 20 x 27 mm, slightly enlarged over prior studies, with some internal low density. Cluster of level 4 lymph nodes continue to enlarge, maximal dimension 17 mm axial image 52. Cluster of lower level 3 nodes have also enlarged slightly. Vascular: Atherosclerotic change at the carotid bifurcation regions as seen previously with potential for flow limiting disease. Limited intracranial: Normal Visualized orbits: Normal Mastoids and visualized paranasal sinuses: No significant sinus finding. Skeleton: Ordinary cervical degenerative changes most pronounced C5-6 and C6-7. No destructive lesion. Upper chest: See results of same date chest CT. Other: None IMPRESSION: 1. Further slight enlargement of lymph nodes in the right level 3/level 4 region and in the lower level 3 region. 2. Stable appearance of lymph nodes on the left. 3. Question of vocal fold paresis on the right, possibly with a  small mass or polyp along the under surface of the right vocal fold. 4. Atherosclerotic change at the carotid bifurcation regions as seen previously with potential for flow limiting disease. Electronically Signed   By: Oneil Officer M.D.   On: 05/05/2024 14:40   CT CHEST W CONTRAST Result Date: 05/02/2024 EXAM: CT CHEST WITH CONTRAST 05/02/2024 12:43:56 PM TECHNIQUE: CT of the chest was performed with the administration of intravenous contrast. Multiplanar reformatted images are provided for review. Automated exposure control, iterative reconstruction, and/or weight based adjustment of the mA/kV was utilized to reduce  the radiation dose to as low as reasonably achievable. COMPARISON: 03/07/2024 CLINICAL HISTORY: Head/neck cancer, monitor; head/neck cancer metastatic to lung. 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 Thoracic atherosclerosis. Moderate 3-vessel coronary atherosclerosis. Postsurgical changes related to prior CABG. Median sternotomy. LYMPH NODES: Progressive right axillary nodal mass, measuring up to 2.6 cm short axis (image 57), previously 2.2 cm. 1.9 cm right lower paratracheal node (image 55), previously 2.0 cm. 1.6 cm short axis right azygoesophageal recess node (image 85), previously 1.7 cm. LUNGS AND PLEURA: No focal consolidation or pulmonary edema. No pleural effusion or pneumothorax. SOFT TISSUES/BONES: Degenerative changes of the visualized thoracolumbar spine. Moderate compression fracture deformity at T12, chronic. UPPER ABDOMEN: Moderate hiatal hernia. Limited images of the upper abdomen demonstrates no acute abnormality. IMPRESSION: 1. Progressive right axillary nodal metastasis. 2. Additional thoracic nodal metastases, stable versus mildly improved. Electronically signed by: Pinkie Pebbles MD 05/02/2024 07:53 PM EDT RP Workstation: [id number]

## 2024-05-08 NOTE — Progress Notes (Signed)
 Consent signed and information given with all questions asked and answered.  Reviewed side effects with management and copies given.    Patient tolerated chemotherapy with no complaints voiced.  Side effects with management reviewed with understanding verbalized.  Port site clean and dry with no bruising or swelling noted at site.  Good blood return noted before and after administration of chemotherapy.  Band aid applied.  Patient left in satisfactory condition with VSS and no s/s of distress noted.

## 2024-05-08 NOTE — Patient Instructions (Addendum)
 [facility] Chemotherapy Teaching   You will see the doctor regularly throughout treatment.  We will obtain blood work from you prior to every treatment and monitor your results to make sure it is safe to give your treatment. The doctor monitors your response to treatment by the way you are feeling, your blood work, and by obtaining scans periodically.  There will be wait times while you are here for treatment.  It will take about 30 minutes to 1 hour for your lab work to result.  Then there will be wait times while pharmacy mixes your medications.   Medications you will receive in the clinic prior to your chemotherapy medications:   Aloxi :  ALOXI  is used in adults to help prevent the nausea and vomiting that happens with certain chemotherapy drugs.  Aloxi  is a long acting medication, and will remain in your system for about 2 days.   Emend:  Emend is an anti-nausea medication used together with Aloxi  to prevent nausea and vomiting that may be caused by chemotherapy.    Dexamethasone :  This is a steroid given prior to chemotherapy to help prevent allergic reactions; it may also help prevent and control nausea and diarrhea.    Pepcid :  This medication is a histamine blocker that helps prevent and allergic reaction to your chemotherapy.    Zyrtec :  This is a histamine blocker (different from the Pepcid ) that helps prevent allergic/infusion reactions to your chemotherapy. This medication may cause dizziness/drowsiness.    Carboplatin  (Paraplatin , CBDCA)  About This Drug  Carboplatin  is used to treat cancer. It is given in the vein (IV).  It will take 30 minutes to infuse.   Possible Side Effects   Bone marrow suppression. This is a decrease in the number of white blood cells, red blood cells, and platelets. This may raise your risk of infection, make you tired and weak (fatigue), and raise your risk of bleeding.   Nausea and vomiting (throwing up)   Weakness   Changes in  your liver function   Changes in your kidney function   Electrolyte changes   Pain  Note: Each of the side effects above was reported in 20% or greater of patients treated with carboplatin . Not all possible side effects are included above.   Warnings and Precautions   Severe bone marrow suppression   Allergic reactions, including anaphylaxis are rare but may happen in some patients. Signs of allergic reaction to this drug may be swelling of the face, feeling like your tongue or throat are swelling, trouble breathing, rash, itching, fever, chills, feeling dizzy, and/or feeling that your heart is beating in a fast or not normal way. If this happens, do not take another dose of this drug. You should get urgent medical treatment.   Severe nausea and vomiting   Effects on the nerves are called peripheral neuropathy. This risk is increased if you are over the age of 31 or if you have received other medicine with risk of peripheral neuropathy. You may feel numbness, tingling, or pain in your hands and feet. It may be hard for you to button your clothes, open jars, or walk as usual. The effect on the nerves may get worse with more doses of the drug. These effects get better in some people after the drug is stopped but it does not get better in all people.   Blurred vision, loss of vision or other changes in eyesight   Decreased hearing   - Skin and  tissue irritation including redness, pain, warmth, or swelling at the IV site if the drug leaks out of the vein and into nearby tissue.   Severe changes in your kidney function, which can cause kidney failure   Severe changes in your liver function, which can cause liver failure  Note: Some of the side effects above are very rare. If you have concerns and/or questions, please discuss them with your medical team.   Important Information   This drug may be present in the saliva, tears, sweat, urine, stool, vomit, semen, and vaginal secretions.  Talk to your doctor and/or your nurse about the necessary precautions to take during this time.   Treating Side Effects   Manage tiredness by pacing your activities for the day.   Be sure to include periods of rest between energy-draining activities.   To decrease the risk of infection, wash your hands regularly.   Avoid close contact with people who have a cold, the flu, or other infections.   Take your temperature as your doctor or nurse tells you, and whenever you feel like you may have a fever.   To help decrease the risk of bleeding, use a soft toothbrush. Check with your nurse before using dental floss.   Be very careful when using knives or tools.   Use an electric shaver instead of a razor.   Drink plenty of fluids (a minimum of eight glasses per day is recommended).   If you throw up or have loose bowel movements, you should drink more fluids so that you do not become dehydrated (lack of water  in the body from losing too much fluid).   To help with nausea and vomiting, eat small, frequent meals instead of three large meals a day. Choose foods and drinks that are at room temperature. Ask your nurse or doctor about other helpful tips and medicine that is available to help stop or lessen these symptoms.   If you have numbness and tingling in your hands and feet, be careful when cooking, walking, and handling sharp objects and hot liquids.   Keeping your pain under control is important to your well-being. Please tell your doctor or nurse if you are experiencing pain.   Food and Drug Interactions   There are no known interactions of carboplatin  with food.   This drug may interact with other medicines. Tell your doctor and pharmacist about all the prescription and over-the-counter medicines and dietary supplements (vitamins, minerals, herbs and others) that you are taking at this time. Also, check with your doctor or pharmacist before starting any new prescription or  over-the-counter medicines, or dietary supplements to make sure that there are no interactions.   When to Call the Doctor  Call your doctor or nurse if you have any of these symptoms and/or any new or unusual symptoms:   Fever of 100.4 F (38 C) or higher   Chills   Tiredness that interferes with your daily activities   Feeling dizzy or lightheaded   Easy bleeding or bruising   Nausea that stops you from eating or drinking and/or is not relieved by prescribed medicines   Throwing up   Blurred vision or other changes in eyesight   Decrease in hearing or ringing in the ear   Signs of allergic reaction: swelling of the face, feeling like your tongue or throat are swelling, trouble breathing, rash, itching, fever, chills, feeling dizzy, and/or feeling that your heart is beating in a fast or not normal way. If  this happens, call 911 for emergency care.   Signs of possible liver problems: dark urine, pale bowel movements, bad stomach pain, feeling very tired and weak, unusual itching, or yellowing of the eyes or skin   Decreased urine, or very dark urine   Numbness, tingling, or pain in your hands and feet   Pain that does not go away or is not relieved by prescribed medicine   While you are getting this drug, please tell your nurse right away if you have any pain, redness, or swelling at the site of the IV infusion, or if you have any new onset of symptoms, or if you just feel different from before when the infusion was started.   Reproduction Warnings   Pregnancy warning: This drug may have harmful effects on the unborn baby. Women of child bearing potential should use effective methods of birth control during your cancer treatment. Let your doctor know right away if you think you may be pregnant.   Breastfeeding warning: It is not known if this drug passes into breast milk. For this reason, women should not breastfeed during treatment because this drug could enter the  breast milk and cause harm to a breastfeeding baby.   Fertility warning: Human fertility studies have not been done with this drug. Talk with your doctor or nurse if you plan to have children. Ask for information on sperm or egg banking.   SELF CARE ACTIVITIES WHILE RECEIVING CHEMOTHERAPY:  Hydration Increase your fluid intake 48 hours prior to treatment and drink at least 8 to 12 cups (64 ounces) of water /decaffeinated beverages per day after treatment. You can still have your cup of coffee or soda but these beverages do not count as part of your 8 to 12 cups that you need to drink daily. No alcohol  intake.  Medications Continue taking your normal prescription medication as prescribed.  If you start any new herbal or new supplements please let us  know first to make sure it is safe.  Mouth Care Have teeth cleaned professionally before starting treatment. Keep dentures and partial plates clean. Use soft toothbrush and do not use mouthwashes that contain alcohol . Biotene is a good mouthwash that is available at most pharmacies or may be ordered by calling [phone]. Use warm salt water  gargles (1 teaspoon salt per 1 quart warm water ) before and after meals and at bedtime. If you need dental work, please let the doctor know before you go for your appointment so that we can coordinate the best possible time for you in regards to your chemo regimen. You need to also let your dentist know that you are actively taking chemo. We may need to do labs prior to your dental appointment.  Skin Care Always use sunscreen that has not expired and with SPF (Sun Protection Factor) of 50 or higher. Wear hats to protect your head from the sun. Remember to use sunscreen on your hands, ears, face, & feet.  Use good moisturizing lotions such as udder cream, eucerin, or even Vaseline. Some chemotherapies can cause dry skin, color changes in your skin and nails.    Avoid long, hot showers or baths. Use gentle,  fragrance-free soaps and laundry detergent. Use moisturizers, preferably creams or ointments rather than lotions because the thicker consistency is better at preventing skin dehydration. Apply the cream or ointment within 15 minutes of showering. Reapply moisturizer at night, and moisturize your hands every time after you wash them.  Hair Loss (if your doctor says your  hair will fall out)  If your doctor says that your hair is likely to fall out, decide before you begin chemo whether you want to wear a wig. You may want to shop before treatment to match your hair color. Hats, turbans, and scarves can also camouflage hair loss, although some people prefer to leave their heads uncovered. If you go bare-headed outdoors, be sure to use sunscreen on your scalp. Cut your hair short. It eases the inconvenience of shedding lots of hair, but it also can reduce the emotional impact of watching your hair fall out. Don't perm or color your hair during chemotherapy. Those chemical treatments are already damaging to hair and can enhance hair loss. Once your chemo treatments are done and your hair has grown back, it's OK to resume dyeing or perming hair.  With chemotherapy, hair loss is almost always temporary. But when it grows back, it may be a different color or texture. In older adults who still had hair color before chemotherapy, the new growth may be completely gray.  Often, new hair is very fine and soft.  Infection Prevention Please wash your hands for at least 30 seconds using warm soapy water . Handwashing is the #1 way to prevent the spread of germs. Stay away from sick people or people who are getting over a cold. If you develop respiratory systems such as green/yellow mucus production or productive cough or persistent cough let us  know and we will see if you need an antibiotic. It is a good idea to keep a pair of gloves on when going into grocery stores/[employer] to decrease your risk of coming into contact  with germs on the carts, etc. Carry alcohol  hand gel with you at all times and use it frequently if out in public. If your temperature reaches 100.4 or higher please call the clinic and let us  know.  If it is after hours or on the weekend please go to the ER if your temperature is over 100.4.  Please have your own personal thermometer at home to use.    Sex and bodily fluids If you are going to have sex, a condom must be used to protect the person that isn't taking chemotherapy. Chemo can decrease your libido (sex drive). For a few days after chemotherapy, chemotherapy can be excreted through your bodily fluids.  When using the toilet please close the lid and flush the toilet twice.  Do this for a few day after you have had chemotherapy.   Effects of chemotherapy on your sex life Some changes are simple and won't last long. They won't affect your sex life permanently.  Sometimes you may feel: too tired not strong enough to be very active sick or sore  not in the mood anxious or low  Your anxiety might not seem related to sex. For example, you may be worried about the cancer and how your treatment is going. Or you may be worried about money, or about how you family are coping with your illness.  These things can cause stress, which can affect your interest in sex. It's important to talk to your partner about how you feel.  Remember - the changes to your sex life don't usually last long. There's usually no medical reason to stop having sex during chemo. The drugs won't have any long term physical effects on your performance or enjoyment of sex. Cancer can't be passed on to your partner during sex  Contraception It's important to use reliable contraception during treatment.  Avoid getting pregnant while you or your partner are having chemotherapy. This is because the drugs may harm the baby. Sometimes chemotherapy drugs can leave a man or woman infertile.  This means you would not be able to have  children in the future. You might want to talk to someone about permanent infertility. It can be very difficult to learn that you may no longer be able to have children. Some people find counselling helpful. There might be ways to preserve your fertility, although this is easier for men than for women. You may want to speak to a fertility expert. You can talk about sperm banking or harvesting your eggs. You can also ask about other fertility options, such as donor eggs. If you have or have had breast cancer, your doctor might advise you not to take the contraceptive pill. This is because the hormones in it might affect the cancer. It is not known for sure whether or not chemotherapy drugs can be passed on through semen or secretions from the vagina. Because of this some doctors advise people to use a barrier method if you have sex during treatment. This applies to vaginal, anal or oral sex. Generally, doctors advise a barrier method only for the time you are actually having the treatment and for about a week after your treatment. Advice like this can be worrying, but this does not mean that you have to avoid being intimate with your partner. You can still have close contact with your partner and continue to enjoy sex.  Animals If you have cats or birds we just ask that you not change the litter or change the cage.  Please have someone else do this for you while you are on chemotherapy.   Food Safety During and After Cancer Treatment Food safety is important for people both during and after cancer treatment. Cancer and cancer treatments, such as chemotherapy, radiation therapy, and stem cell/bone marrow transplantation, often weaken the immune system. This makes it harder for your body to protect itself from foodborne illness, also called food poisoning. Foodborne illness is caused by eating food that contains harmful bacteria, parasites, or viruses.  Foods to avoid Some foods have a higher risk of becoming  tainted with bacteria. These include: Unwashed fresh fruit and vegetables, especially leafy vegetables that can hide dirt and other contaminants Raw sprouts, such as alfalfa sprouts Raw or undercooked beef, especially ground beef, or other raw or undercooked meat and poultry Fatty, fried, or spicy foods immediately before or after treatment.  These can sit heavy on your stomach and make you feel nauseous. Raw or undercooked shellfish, such as oysters. Sushi and sashimi, which often contain raw fish.  Unpasteurized beverages, such as unpasteurized fruit juices, raw milk, raw yogurt, or cider Undercooked eggs, such as soft boiled, over easy, and poached; raw, unpasteurized eggs; or foods made with raw egg, such as homemade raw cookie dough and homemade mayonnaise  Simple steps for food safety  Shop smart. Do not buy food stored or displayed in an unclean area. Do not buy bruised or damaged fruits or vegetables. Do not buy cans that have cracks, dents, or bulges. Pick up foods that can spoil at the end of your shopping trip and store them in a cooler on the way home.  Prepare and clean up foods carefully. Rinse all fresh fruits and vegetables under running water , and dry them with a clean towel or paper towel. Clean the top of cans before opening them. After preparing  food, wash your hands for 20 seconds with hot water  and soap. Pay special attention to areas between fingers and under nails. Clean your utensils and dishes with hot water  and soap. Disinfect your kitchen and cutting boards using 1 teaspoon of liquid, unscented bleach mixed into 1 quart of water .    Dispose of old food. Eat canned and packaged food before its expiration date (the "use by" or "best before" date). Consume refrigerated leftovers within 3 to 4 days. After that time, throw out the food. Even if the food does not smell or look spoiled, it still may be unsafe. Some bacteria, such as Listeria, can grow even on foods  stored in the refrigerator if they are kept for too long.  Take precautions when eating out. At restaurants, avoid buffets and salad bars where food sits out for a long time and comes in contact with many people. Food can become contaminated when someone with a virus, often a norovirus, or another "bug" handles it. Put any leftover food in a "to-go" container yourself, rather than having the server do it. And, refrigerate leftovers as soon as you get home. Choose restaurants that are clean and that are willing to prepare your food as you order it cooked.   AT HOME MEDICATIONS:                                                                                                                                                                Compazine /Prochlorperazine  10mg  tablet. Take 1 tablet every 6 hours as needed for nausea/vomiting. (This can make you sleepy)   EMLA  cream. Apply a quarter size amount to port site 1 hour prior to chemo. Do not rub in. Cover with plastic wrap.    Diarrhea Sheet   If you are having loose stools/diarrhea, please purchase Imodium and begin taking as outlined:  At the first sign of poorly formed or loose stools you should begin taking Imodium (loperamide) 2 mg capsules.  Take two tablets (4mg ) followed by one tablet (2mg ) every 2 hours - DO NOT EXCEED 8 tablets in 24 hours.  If it is bedtime and you are having loose stools, take 2 tablets at bedtime, then 2 tablets every 4 hours until morning.   Always call the Cancer Center if you are having loose stools/diarrhea that you can't get under control.  Loose stools/diarrhea leads to dehydration (loss of water ) in your body.  We have other options of trying to get the loose stools/diarrhea to stop but you must let us  know!   Constipation Sheet  Colace - 100 mg capsules - take 2 capsules daily.  If this doesn't help then you can increase to 2 capsules twice daily.  Please call if the above does not work for you. Do  not  go more than 2 days without a bowel movement.  It is very important that you do not become constipated.  It will make you feel sick to your stomach (nausea) and can cause abdominal pain and vomiting.  Nausea Sheet   Compazine /Prochlorperazine  10mg  tablet. Take 1 tablet every 6 hours as needed for nausea/vomiting (This can make you drowsy).  If you are having persistent nausea (nausea that does not stop) please call the Cancer Center and let us  know the amount of nausea that you are experiencing.  If you begin to vomit, you need to call the Cancer Center and if it is the weekend and you have vomited more than one time and can't get it to stop-go to the Emergency Room.  Persistent nausea/vomiting can lead to dehydration (loss of fluid in your body) and will make you feel very weak and unwell. Ice chips, sips of clear liquids, foods that are at room temperature, crackers, and toast tend to be better tolerated.   SYMPTOMS TO REPORT AS SOON AS POSSIBLE AFTER TREATMENT:  FEVER GREATER THAN 100.4 F  CHILLS WITH OR WITHOUT FEVER  NAUSEA AND VOMITING THAT IS NOT CONTROLLED WITH YOUR NAUSEA MEDICATION  UNUSUAL SHORTNESS OF BREATH  UNUSUAL BRUISING OR BLEEDING  TENDERNESS IN MOUTH AND THROAT WITH OR WITHOUT PRESENCE OF ULCERS  URINARY PROBLEMS  BOWEL PROBLEMS  UNUSUAL RASH      Wear comfortable clothing and clothing appropriate for easy access to any Portacath or PICC line. Let us  know if there is anything that we can do to make your therapy better!    What to do if you need assistance after hours or on the weekends: CALL [phone].  HOLD on the line, do not hang up.  You will hear multiple messages but at the end you will be connected with a nurse triage line.  They will contact the doctor if necessary.  Most of the time they will be able to assist you.  Do not call the hospital operator.      I have been informed and understand all of the instructions given to me and have received  a copy. I have been instructed to call the clinic [phone] or my family physician as soon as possible for continued medical care, if indicated. I do not have any more questions at this time but understand that I may call the Cancer Center or the Patient Navigator at [phone] during office hours should I have questions or need assistance in obtaining follow-up care.    [facility] [place] - A DEPT OF [facility]  Discharge Instructions: Thank you for choosing [facility] to provide your oncology and hematology care.  If you have a lab appointment with the Cancer Center - please note that after April 8th, 2024, all labs will be drawn in the cancer center.  You do not have to check in or register with the main entrance as you have in the past but will complete your check-in in the cancer center.  Wear comfortable clothing and clothing appropriate for easy access to any Portacath or PICC line.   We strive to give you quality time with your provider. You may need to reschedule your appointment if you arrive late (15 or more minutes).  Arriving late affects you and other patients whose appointments are after yours.  Also, if you miss three or more appointments without notifying the office, you may be dismissed from  the clinic at the provider's discretion.      For prescription refill requests, have your pharmacy contact our office and allow 72 hours for refills to be completed.    Today you received the following chemotherapy and/or immunotherapy agents carboplatin .       To help prevent nausea and vomiting after your treatment, we encourage you to take your nausea medication as directed.  BELOW ARE SYMPTOMS THAT SHOULD BE REPORTED IMMEDIATELY: *FEVER GREATER THAN 100.4 F (38 C) OR HIGHER *CHILLS OR SWEATING *NAUSEA AND VOMITING THAT IS NOT CONTROLLED WITH YOUR NAUSEA MEDICATION *UNUSUAL SHORTNESS OF BREATH *UNUSUAL BRUISING OR BLEEDING *URINARY  PROBLEMS (pain or burning when urinating, or frequent urination) *BOWEL PROBLEMS (unusual diarrhea, constipation, pain near the anus) TENDERNESS IN MOUTH AND THROAT WITH OR WITHOUT PRESENCE OF ULCERS (sore throat, sores in mouth, or a toothache) UNUSUAL RASH, SWELLING OR PAIN  UNUSUAL VAGINAL DISCHARGE OR ITCHING   Items with * indicate a potential emergency and should be followed up as soon as possible or go to the Emergency Department if any problems should occur.  Please show the CHEMOTHERAPY ALERT CARD or IMMUNOTHERAPY ALERT CARD at check-in to the Emergency Department and triage nurse.  Should you have questions after your visit or need to cancel or reschedule your appointment, please contact [facility] [place] - A DEPT OF [facility] [phone]  and follow the prompts.  Office hours are 8:00 a.m. to 4:30 p.m. Monday - Friday. Please note that voicemails left after 4:00 p.m. may not be returned until the following business day.  We are closed weekends and major holidays. You have access to a nurse at all times for urgent questions. Please call the main number to the clinic [phone] and follow the prompts.  For any non-urgent questions, you may also contact your provider using MyChart. We now offer e-Visits for anyone 66 and older to request care online for non-urgent symptoms. For details visit [email or web address].   Also download the MyChart app! Go to the app store, search MyChart, open the app, select [place], and log in with your MyChart username and password.

## 2024-05-08 NOTE — Patient Instructions (Addendum)
 [facility] Discharge Instructions   You were seen and examined today by Dr. Rogers.  He reviewed the results of your lab work which are normal/stable.   He reviewed the results of your CT scan which is showing a mixed response to treatment. Because of the side effects your having from the Erbitux  and the results of the CT, Dr. MARLA recommends we change treatments.   Treatment will be with a chemo drug called carboplatin . It is a 30 minute infusion that is given every 3 weeks.   We will proceed with your treatment today.   Return as scheduled.    Thank you for choosing [facility] at [facility] to provide your oncology and hematology care.  To afford each patient quality time with our provider, please arrive at least 15 minutes before your scheduled appointment time.   If you have a lab appointment with the Cancer Center please come in thru the Main Entrance and check in at the main information desk.  You need to re-schedule your appointment should you arrive 10 or more minutes late.  We strive to give you quality time with our providers, and arriving late affects you and other patients whose appointments are after yours.  Also, if you no show three or more times for appointments you may be dismissed from the clinic at the providers discretion.     Again, thank you for choosing [facility].  Our hope is that these requests will decrease the amount of time that you wait before being seen by our physicians.       _____________________________________________________________  Should you have questions after your visit to [facility], please contact our office at [phone] and follow the prompts.  Our office hours are 8:00 a.m. and 4:30 p.m. Monday - Friday.  Please note that voicemails left after 4:00 p.m. may not be returned until the following business day.  We are closed weekends and major holidays.   You do have access to a nurse 24-7, just call the main number to the clinic [phone] and do not press any options, hold on the line and a nurse will answer the phone.    For prescription refill requests, have your pharmacy contact our office and allow 72 hours.    Due to Covid, you will need to wear a mask upon entering the hospital. If you do not have a mask, a mask will be given to you at the Main Entrance upon arrival. For doctor visits, patients may have 1 support person age 88 or older with them. For treatment visits, patients can not have anyone with them due to social distancing guidelines and our immunocompromised population.

## 2024-05-09 NOTE — Progress Notes (Signed)
 Day 3 Udenyca  added to each cycle per Dr. Katragadda. PA is pending.  Catalina Salasar, PharmD, MBA

## 2024-05-09 NOTE — Progress Notes (Signed)
 24 HOUR CHEMOTHERAPY CALL BACK:  Patient is a little fatigued today, but no other side effects noted.  Patient advised to call back with any further questions or concerns.

## 2024-05-10 ENCOUNTER — Inpatient Hospital Stay

## 2024-05-10 VITALS — BP 120/74 | HR 70 | Temp 97.4°F | Resp 18

## 2024-05-10 DIAGNOSIS — R21 Rash and other nonspecific skin eruption: Secondary | ICD-10-CM | POA: Diagnosis not present

## 2024-05-10 DIAGNOSIS — Z801 Family history of malignant neoplasm of trachea, bronchus and lung: Secondary | ICD-10-CM | POA: Diagnosis not present

## 2024-05-10 DIAGNOSIS — R530 Neoplastic (malignant) related fatigue: Secondary | ICD-10-CM | POA: Diagnosis not present

## 2024-05-10 DIAGNOSIS — C4442 Squamous cell carcinoma of skin of scalp and neck: Secondary | ICD-10-CM

## 2024-05-10 DIAGNOSIS — G629 Polyneuropathy, unspecified: Secondary | ICD-10-CM | POA: Diagnosis not present

## 2024-05-10 DIAGNOSIS — E876 Hypokalemia: Secondary | ICD-10-CM | POA: Diagnosis not present

## 2024-05-10 DIAGNOSIS — Z87891 Personal history of nicotine dependence: Secondary | ICD-10-CM | POA: Diagnosis not present

## 2024-05-10 DIAGNOSIS — Z5111 Encounter for antineoplastic chemotherapy: Secondary | ICD-10-CM | POA: Diagnosis not present

## 2024-05-10 DIAGNOSIS — Z95828 Presence of other vascular implants and grafts: Secondary | ICD-10-CM

## 2024-05-10 DIAGNOSIS — Z5189 Encounter for other specified aftercare: Secondary | ICD-10-CM | POA: Diagnosis not present

## 2024-05-10 DIAGNOSIS — D72820 Lymphocytosis (symptomatic): Secondary | ICD-10-CM | POA: Diagnosis not present

## 2024-05-10 DIAGNOSIS — Z7952 Long term (current) use of systemic steroids: Secondary | ICD-10-CM | POA: Diagnosis not present

## 2024-05-10 DIAGNOSIS — C760 Malignant neoplasm of head, face and neck: Secondary | ICD-10-CM | POA: Diagnosis not present

## 2024-05-10 DIAGNOSIS — C778 Secondary and unspecified malignant neoplasm of lymph nodes of multiple regions: Secondary | ICD-10-CM | POA: Diagnosis not present

## 2024-05-10 MED ORDER — PEGFILGRASTIM-CBQV 6 MG/0.6ML ~~LOC~~ SOSY
6.0000 mg | PREFILLED_SYRINGE | Freq: Once | SUBCUTANEOUS | Status: AC
  Administered 2024-05-10: 6 mg via SUBCUTANEOUS
  Filled 2024-05-10: qty 0.6

## 2024-05-10 NOTE — Patient Instructions (Signed)
 [facility] [place] - A DEPT OF [facility]  Discharge Instructions: Thank you for choosing [facility] to provide your oncology and hematology care.  If you have a lab appointment with the Cancer Center - please note that after April 8th, 2024, all labs will be drawn in the cancer center.  You do not have to check in or register with the main entrance as you have in the past but will complete your check-in in the cancer center.  Wear comfortable clothing and clothing appropriate for easy access to any Portacath or PICC line.   We strive to give you quality time with your provider. You may need to reschedule your appointment if you arrive late (15 or more minutes).  Arriving late affects you and other patients whose appointments are after yours.  Also, if you miss three or more appointments without notifying the office, you may be dismissed from the clinic at the provider's discretion.      For prescription refill requests, have your pharmacy contact our office and allow 72 hours for refills to be completed.    Today you received the following Udencya, return as scheduled.   To help prevent nausea and vomiting after your treatment, we encourage you to take your nausea medication as directed.  BELOW ARE SYMPTOMS THAT SHOULD BE REPORTED IMMEDIATELY: *FEVER GREATER THAN 100.4 F (38 C) OR HIGHER *CHILLS OR SWEATING *NAUSEA AND VOMITING THAT IS NOT CONTROLLED WITH YOUR NAUSEA MEDICATION *UNUSUAL SHORTNESS OF BREATH *UNUSUAL BRUISING OR BLEEDING *URINARY PROBLEMS (pain or burning when urinating, or frequent urination) *BOWEL PROBLEMS (unusual diarrhea, constipation, pain near the anus) TENDERNESS IN MOUTH AND THROAT WITH OR WITHOUT PRESENCE OF ULCERS (sore throat, sores in mouth, or a toothache) UNUSUAL RASH, SWELLING OR PAIN  UNUSUAL VAGINAL DISCHARGE OR ITCHING   Items with * indicate a potential emergency and should be followed up as soon as possible or  go to the Emergency Department if any problems should occur.  Please show the CHEMOTHERAPY ALERT CARD or IMMUNOTHERAPY ALERT CARD at check-in to the Emergency Department and triage nurse.  Should you have questions after your visit or need to cancel or reschedule your appointment, please contact [facility] [place] - A DEPT OF [facility] [phone]  and follow the prompts.  Office hours are 8:00 a.m. to 4:30 p.m. Monday - Friday. Please note that voicemails left after 4:00 p.m. may not be returned until the following business day.  We are closed weekends and major holidays. You have access to a nurse at all times for urgent questions. Please call the main number to the clinic [phone] and follow the prompts.  For any non-urgent questions, you may also contact your provider using MyChart. We now offer e-Visits for anyone 63 and older to request care online for non-urgent symptoms. For details visit [email or web address].   Also download the MyChart app! Go to the app store, search MyChart, open the app, select [place], and log in with your MyChart username and password.

## 2024-05-10 NOTE — Progress Notes (Signed)
 Patient tolerated injection with no complaints voiced. Site clean and dry with no bruising or swelling noted at site. See MAR for details. Band aid applied.  Patient stable during and after injection. VSS with discharge and left in satisfactory condition with no s/s of distress noted.

## 2024-05-12 DIAGNOSIS — H10023 Other mucopurulent conjunctivitis, bilateral: Secondary | ICD-10-CM | POA: Diagnosis not present

## 2024-05-16 DIAGNOSIS — H10233 Serous conjunctivitis, except viral, bilateral: Secondary | ICD-10-CM | POA: Diagnosis not present

## 2024-05-22 ENCOUNTER — Inpatient Hospital Stay: Admitting: Oncology

## 2024-05-22 ENCOUNTER — Inpatient Hospital Stay

## 2024-05-25 ENCOUNTER — Ambulatory Visit (INDEPENDENT_AMBULATORY_CARE_PROVIDER_SITE_OTHER): Admitting: Family Medicine

## 2024-05-25 ENCOUNTER — Encounter: Payer: Self-pay | Admitting: Family Medicine

## 2024-05-25 VITALS — BP 111/75 | HR 84 | Ht 66.0 in | Wt 170.0 lb

## 2024-05-25 DIAGNOSIS — Z7901 Long term (current) use of anticoagulants: Secondary | ICD-10-CM | POA: Diagnosis not present

## 2024-05-25 DIAGNOSIS — I4819 Other persistent atrial fibrillation: Secondary | ICD-10-CM | POA: Diagnosis not present

## 2024-05-25 LAB — COAGUCHEK XS/INR WAIVED
INR: 1.4 — ABNORMAL HIGH (ref 0.9–1.1)
Prothrombin Time: 17.2 s

## 2024-05-25 NOTE — Progress Notes (Signed)
 BP 111/75   Pulse 84   Ht 5' 6 (1.676 m)   Wt 170 lb (77.1 kg)   SpO2 96%   BMI 27.44 kg/m    Subjective:   Patient ID: [name], male    DOB: [birth year], 89 y.o.   MRN: [id number]  HPI: [name] is a 89 y.o. male presenting on 05/25/2024 for Medical Management of Chronic Issues and Atrial Fibrillation   Discussed the use of AI scribe software for clinical note transcription with the patient, who gave verbal consent to proceed.  History of Present Illness   [name] is an 89 year old male with atrial fibrillation who presents for a recheck of warfarin levels.  He is undergoing chemotherapy. His goal INR range is 2.0 to 3.0, but today's INR is 1.4, which is below the target range. Last week, his INR was 2.7.  He experiences easy bruising with minor trauma, but the bruises heal appropriately. He recalls a significant bruise from a previous blood draw attempt that resulted in iron staining of the skin, which has persisted.  His current medication regimen for warfarin includes taking one and a half tablets tonight to adjust his INR levels. He takes a whole tablet on Mondays and Wednesdays, and a half tablet on the other days of the week. No abnormal bleeding or bruising beyond what he considers normal for him.          Relevant past medical, surgical, family and social history reviewed and updated as indicated. Interim medical history since our last visit reviewed. Allergies and medications reviewed and updated.  Review of Systems  Per HPI unless specifically indicated above   Allergies as of 05/25/2024       Reactions   Penicillins Hives, Rash   Has patient had a PCN reaction causing immediate rash, facial/tongue/throat swelling, SOB or lightheadedness with hypotension: Yes Has patient had a PCN reaction causing severe rash involving mucus membranes or skin necrosis: Yes Has patient had a PCN reaction that required hospitalization: No Has patient had a PCN  reaction occurring within the last 10 years: No If all of the above answers are NO, then may proceed with Cephalosporin use.   Cetuximab  Cough   Cough and scratchy throat. Drug rechallenged and pt able to tolerated the rest of the infusion without any complications.    Hct [hydrochlorothiazide] Other (See Comments)   hyper   Statins Other (See Comments)   Myalgia, tolerates low dosages of lovastatin          Medication List        Accurate as of May 25, 2024  1:08 PM. If you have any questions, ask your nurse or doctor.          acetaminophen  500 MG tablet Commonly known as: TYLENOL  Take 1,000 mg by mouth every 6 (six) hours as needed for moderate pain.   clindamycin  1 % gel Commonly known as: CLINDAGEL Apply 1 Application topically 2 (two) times daily.   diltiazem  120 MG 24 hr capsule Commonly known as: CARDIZEM  CD TAKE ONE CAPSULE BY MOUTH DAILY   doxycycline  100 MG tablet Commonly known as: VIBRA -TABS Take 100 mg by mouth 2 (two) times daily.   ezetimibe  10 MG tablet Commonly known as: ZETIA  TAKE ONE (1) TABLET BY MOUTH EVERY DAY   finasteride  5 MG tablet Commonly known as: PROSCAR  TAKE ONE (1) TABLET EACH DAY   fluocinonide  cream 0.05 % Commonly known as: LIDEX  Apply 1 Application topically  2 (two) times daily.   fluticasone  50 MCG/ACT nasal spray Commonly known as: FLONASE  Place 1 spray into both nostrils daily as needed for allergies or rhinitis.   furosemide  20 MG tablet Commonly known as: LASIX  Take 1 tablet (20 mg total) by mouth daily.   lovastatin  40 MG tablet Commonly known as: MEVACOR  Take 1 tablet (40 mg total) by mouth at bedtime.   magnesium  oxide 400 (240 Mg) MG tablet Commonly known as: MAG-OX Take 1 tablet (400 mg total) by mouth 2 (two) times daily.   methylphenidate  5 MG tablet Commonly known as: RITALIN  TAKE 1 TABLET BY MOUTH DAILY AS NEEDED   metoprolol  tartrate 50 MG tablet Commonly known as: LOPRESSOR  Take 1 tablet  (50 mg total) by mouth 2 (two) times daily.   montelukast  10 MG tablet Commonly known as: SINGULAIR  Take 1 tablet (10 mg total) by mouth at bedtime.   potassium chloride  SA 20 MEQ tablet Commonly known as: KLOR-CON  M Take 1 tablet (20 mEq total) by mouth daily.   predniSONE  5 MG tablet Commonly known as: DELTASONE  Take 1 tablet (5 mg total) by mouth daily with breakfast.   prochlorperazine  10 MG tablet Commonly known as: COMPAZINE  Take 1 tablet (10 mg total) by mouth every 6 (six) hours as needed for nausea or vomiting.   tobramycin  0.3 % ophthalmic solution Commonly known as: TOBREX  SMARTSIG:In Eye(s)   Vitamin D3 125 MCG (5000 UT) Caps Take 5,000 Units by mouth once a week.   warfarin 5 MG tablet Commonly known as: COUMADIN  Take as directed by the anticoagulation clinic. If you are unsure how to take this medication, talk to your nurse or doctor. Original instructions: TAKE 1 TABLET ON SATURDAY, SUNDAY, TUESDAY, & THURSDAY. TAKE 1/2 TABLET ON MONDAY, WEDNESDAY, & FRIDAY         Objective:   BP 111/75   Pulse 84   Ht 5' 6 (1.676 m)   Wt 170 lb (77.1 kg)   SpO2 96%   BMI 27.44 kg/m   Wt Readings from Last 3 Encounters:  05/25/24 170 lb (77.1 kg)  05/08/24 171 lb (77.6 kg)  04/13/24 172 lb (78 kg)    Physical Exam Physical Exam             Assessment & Plan:   Problem List Items Addressed This Visit       Cardiovascular and Mediastinum   Atrial fibrillation, persistent (HCC) - Primary   Relevant Orders   CoaguChek XS/INR Waived     Other   Long term current use of anticoagulant therapy        Atrial fibrillation with subtherapeutic INR INR at 1.4, below target range, likely due to chemotherapy. No bleeding reported; abnormal bruising present as patient reports easy bruising with minor trauma. - Adjust warfarin dose to 1.5 tablets tonight. - Prescribe warfarin dosing schedule: 1 tablet on Mondays and Wednesdays, 0.5 tablet on other days. -  Recheck INR in 4 weeks.  Ecchymosis and easy bruising Easy bruising with minor trauma, bruises heal appropriately. Known side effect of warfarin.      Description   Increase dose to take 1-1/2 tablets today and then take 1/2 tablet every day except Sunday and Wednesday, take a whole tablet  INR today is 1.4 (goal is 2-3)    Recheck in 4 to 6 weeks weeks         Follow up plan: Return if symptoms worsen or fail to improve, for 4-week inr.  Counseling provided  for all of the vaccine components Orders Placed This Encounter  Procedures   CoaguChek XS/INR Waived    Fonda Levins, MD [facility] 05/25/2024, 1:08 PM

## 2024-05-29 NOTE — Progress Notes (Signed)
 Patient Care Team: [name], Fonda LABOR, MD as PCP - General (Family Medicine) [name], MD as PCP - Cardiology (Cardiology) Watt Rush, MD as Attending Physician (Urology) [name], MD as Consulting Physician (Cardiology) Golda Claudis PENNER, MD (Inactive) as Consulting Physician (Gastroenterology) Perley Hamilton, MD as Consulting Physician (Ophthalmology) Heide Ingle, MD as Consulting Physician (Orthopedic Surgery) Frances Ozell GORMAN HUGHS as [facility] Care Management (Licensed Clinical Social Worker) Celestia Joesph SQUIBB, RN as Oncology Nurse Navigator (Medical Oncology)  Clinic Day:  05/30/2024  Referring physician: [name], Fonda LABOR, MD   CHIEF COMPLAINT:  CC:  Metastatic squamous cell carcinoma, 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 male was transferred to my care after his prior physician has left.   ASSESSMENT & PLAN:   Assessment & Plan: [name]  is a 89 y.o. 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  Assessment & Plan Squamous cell carcinoma of head and neck Patient with metastatic carcinoma of unknown primary likely head and neck origin Patient had metastatic disease in the supraclavicular region with bilateral pulmonary nodules and right axillary lymphadenopathy 1st line: Carboplatin  plus paclitaxel  plus pembrolizumab  (PD-L1 CPS: 25) with progression in right axillary and subcarinal lymphadenopathy Second line: Cetuximab , with progression in right axillary lymphadenopathy Currently on third line: Single agent carboplatin  every 3 weeks Recent CT scan with progression of disease and since then had a change in treatment to single agent carboplatin .  -Patient tolerating treatment well.  Has no chemotherapy induced side effects at this time - Labs reviewed today.  CMP: WNL, CBC: Stable leukocytosis, no anemia or thrombocytopenia -Continue with treatment today. - Repeat CT scan in 3 months.   Due 07/2024  Return to clinic in 3 weeks with labs prior to next cycle of chemotherapy   CLL (chronic lymphocytic leukemia) (HCC) Patient has history of CLL diagnosed in 2023.  RAI stage I No B symptoms  - Does not meet criteria for treatment at this time - Continue to monitor Neoplastic malignant related fatigue Patient has cancer-related fatigue and is on Ritalin  5mg  as needed  - Continue Ritalin  5 mg as needed. Polyarthralgia Patient has a history of polyarthralgia likely secondary to previous treatments Was on prednisone  5 mg daily. Does not report any polyarthralgias at this time.  Can discontinue prednisone . Rash Patient has cetuximab  induced rash. On doxycycline  and clindamycin  gel Cetuximab  discontinued at this time.  Rash significantly improved  - Can discontinue doxycycline  and clindamycin  gel at this time - Continue using moisturizing cream for skin dryness Blurry vision Patient reports blurry vision is a complication of previous cetuximab  infusion  - Continue to follow with ophthalmology    The patient understands the plans discussed today and is in agreement with them.  He knows to contact our office if he develops concerns prior to his next appointment.  50 minutes of total time was spent for this patient encounter, including preparation, face-to-face counseling with the patient and coordination of care, physical exam, and documentation of the encounter. > 50% of the time was spent on counseling as documented under my assessment and plan.    [name],acting as a Neurosurgeon for [name], MD.,have documented all relevant documentation on the behalf of [name], MD,as directed by  [name], MD while in the presence of [name], MD.  I, [name] MD, have reviewed the above documentation for accuracy and completeness, and I agree with the above.  [name], MD  [facility]  DEPT OF [facility] [address] [place] [address] Dept: [phone] Dept Fax: [phone]   No orders of the defined types were placed in this encounter.    ONCOLOGY HISTORY:   I have reviewed his chart and materials related to his cancer extensively and collaborated history with the patient. 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 Solitary pathologic lymph node in the left neck also highly suspicious for metastatic disease. Multiple lung nodule in the a lung apices bilaterally compatible with metastatic disease. -05/28/2022: CT CAP: Large nodal mass in the right supraclavicular region with bilateral pulmonary nodules. In addition, slightly enlarged lymph nodes in the right axilla and right sub pectoralis region. Primary neoplastic source may be located in the right supraclavicular region but etiology is uncertain based on imaging. No evidence for metastatic disease in the abdomen or pelvis. Liver has a slightly nodular contour.  -06/08/2022: Brain MRI: No evidence of mass lesion.  -06/09/2022: Right chest supraclavicular lymph node excision  Pathology: Metastatic poorly differentiated carcinoma.T e metastatic carcinoma is positive with p40, p63,  cytokeratin 5/6 and shows patchy positivity with cytokeratin 7.  GATA3  shows focal, weak likely nonspecific staining. The immunophenotype is most consistent with metastatic squamous cell carcinoma.  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 Met amplification, RET rearrangement, PTEN deletion: Not detected [employer] and [facility] gen[employer]: NGS: TP53 pathogenic variant, MSI-stable, TMB-low, LOH-low, PD-L1 (SP142): Negative, p16 and p18 negative.  HER2 by IHC 0.pan-TRK: Not  expressed PD-L1 22C3: CPS score 25 Cancer type ID: 96% probability squamous cell carcinoma, subtype head and neck/skin.  Cancer types ruled out with 95% confidence includes skin basal cell carcinoma.  -06/11/2022: Initial PET: Bulky RIGHT supraclavicular nodal mass with hypermetabolic activity. Findings consistent with metastatic adenopathy. Smaller hypermetabolic RIGHT posterior triangle and jugular lymph nodes. Single hypermetabolic LEFT level 3 lymph node. Bilateral hypermetabolic pulmonary nodules in a metastatic pattern. Number of metastatic pulmonary nodules much greater on the RIGHT. Minimal hypermetabolic mediastinal nodal metastasis. Cluster of hypermetabolic RIGHT axillary lymph nodes. No evidence of metastatic disease or primary lesion within the abdomen pelvis. No evidence skeletal metastasis. -07/23/2022-11/09/2022:  Carboplatin , Taxol  and pembrolizumab   -12/16/2022: CT CAP: Stable size and number of numerous pulmonary nodules in the lungs. No new pulmonary nodules are noted. Slight increased prominence of borderline enlarged right axillary lymph nodes. No signs of new metastatic disease in the abdomen or pelvis. Subtle changes in the lungs concerning for probable interstitial lung disease. -12/16/2022: CT soft tissue neck: Mixed response with some lymph nodes decreased in size and others increased in size from the prior study. -07/16/2023: CT CAP: New mild right axillary and right subcarinal lymphadenopathy. Right paratracheal adenopathy is mildly increased. Stable right hilar adenopathy. Stable peripheral right upper lobe pulmonary nodule. No evidence of metastatic disease in the abdomen or pelvis. Cirrhosis. No ascites. Normal size spleen. -08/04/2023: Right axillary lymph node biopsy. Pathology: Metastatic moderate to poorly differentiated squamous cell carcinoma.  -10/25/2023-05/08/2024: Cetuximab  every 2 weeks, discontinued due to progression -05/02/2024: CT chest: Progressive right  axillary nodal metastasis. Additional thoracic nodal metastases, stable versus mildly improved. -05/02/2024: CT soft tissue neck: Further slight enlargement of lymph nodes  in the right level 3/level 4 region and in the lower level 3 region. Stable appearance of lymph nodes on the left. Question of vocal fold paresis on the right, possibly with a small mass or polyp along the under surface of the right vocal fold. -05/08/2024 to current: Single agent carboplatin  (AUC 5) every 21 days   CLL: RAI stage I: -06/03/2022: Peripheral blood flow cytometry: Monoclonal B-cell lymphocytosis, high count with a CLL immunophenotype.  CD5, CD19, CD20, CD200, lambda: Positive  Current Treatment:  Single agent carboplatin  (AUC 5) every 21 days   INTERVAL HISTORY:   [name] is here today for follow up. Patient is accompanied by his wife.   He reports blurry vision that makes it difficult to see. He states his right neck mass has slightly grown since his initial diagnosis and the mas occasionally causes neck pain when turning his head. His right axillary adenopathy has improved since starting carboplatin .   [name] denies any nausea, vomiting, or diarrhea due to treatment. His appetite is normal. He also notes numbness and tingling in the bilateral feet. Numbness in the right lower extremity occasionally extends from the foot to the knee, which he believes is attributing to difficulty walking and lifting his right leg. [name] reports chemo-related fatigue, which he also attributes to difficulty ambulating long distances. He is taking ritalin  when he travels or leaves his home.   [name] is still taking doxycycline , prescribed while undergoing Cetuximab  treatment in order to prevent skin rashes. He is using Aquaphor topically to improve skin rash. He recently ran out of prednisone  and would like to know if he should continue the medication.   I have reviewed the past medical history, past surgical history, social  history and family history with the patient and they are unchanged from previous note.  ALLERGIES:  is allergic to penicillins, cetuximab , hct [hydrochlorothiazide], and statins.  MEDICATIONS:  Current Outpatient Medications  Medication Sig Dispense Refill   acetaminophen  (TYLENOL ) 500 MG tablet Take 1,000 mg by mouth every 6 (six) hours as needed for moderate pain.     Cholecalciferol (VITAMIN D3) 5000 units CAPS Take 5,000 Units by mouth once a week.      clindamycin  (CLINDAGEL) 1 % gel Apply 1 Application topically 2 (two) times daily.     diltiazem  (CARDIZEM  CD) 120 MG 24 hr capsule TAKE ONE CAPSULE BY MOUTH DAILY 90 capsule 0   doxycycline  (VIBRA -TABS) 100 MG tablet Take 100 mg by mouth 2 (two) times daily.     ezetimibe  (ZETIA ) 10 MG tablet TAKE ONE (1) TABLET BY MOUTH EVERY DAY 90 tablet 0   finasteride  (PROSCAR ) 5 MG tablet TAKE ONE (1) TABLET EACH DAY 90 tablet 3   fluocinonide  cream (LIDEX ) 0.05 % Apply 1 Application topically 2 (two) times daily. 60 g 3   fluticasone  (FLONASE ) 50 MCG/ACT nasal spray Place 1 spray into both nostrils daily as needed for allergies or rhinitis.     furosemide  (LASIX ) 20 MG tablet Take 1 tablet (20 mg total) by mouth daily. 90 tablet 3   lovastatin  (MEVACOR ) 40 MG tablet Take 1 tablet (40 mg total) by mouth at bedtime. 90 tablet 3   magnesium  oxide (MAG-OX) 400 (240 Mg) MG tablet Take 1 tablet (400 mg total) by mouth 2 (two) times daily. 60 tablet 3   methylphenidate  (RITALIN ) 5 MG tablet TAKE 1 TABLET BY MOUTH DAILY AS NEEDED 30 tablet 0   metoprolol  tartrate (LOPRESSOR ) 50 MG tablet Take 1 tablet (50 mg  total) by mouth 2 (two) times daily. 180 tablet 3   montelukast  (SINGULAIR ) 10 MG tablet Take 1 tablet (10 mg total) by mouth at bedtime. 90 tablet 3   potassium chloride  SA (KLOR-CON  M) 20 MEQ tablet Take 1 tablet (20 mEq total) by mouth daily. 30 tablet 3   predniSONE  (DELTASONE ) 5 MG tablet Take 1 tablet (5 mg total) by mouth daily with breakfast. 90  tablet 2   prochlorperazine  (COMPAZINE ) 10 MG tablet Take 1 tablet (10 mg total) by mouth every 6 (six) hours as needed for nausea or vomiting. 30 tablet 2   tobramycin  (TOBREX ) 0.3 % ophthalmic solution SMARTSIG:In Eye(s)     trimethoprim-polymyxin b  (POLYTRIM) ophthalmic solution SMARTSIG:In Eye(s)     warfarin (COUMADIN ) 5 MG tablet TAKE 1 TABLET ON SATURDAY, SUNDAY, TUESDAY, & THURSDAY. TAKE 1/2 TABLET ON MONDAY, WEDNESDAY, & FRIDAY 70 tablet 3   No current facility-administered medications for this visit.   Facility-Administered Medications Ordered in Other Visits  Medication Dose Route Frequency Provider Last Rate Last Admin   0.9 %  sodium chloride  infusion   Intravenous Continuous Rox Mcgriff, MD   Stopped at 05/30/24 1211    REVIEW OF SYSTEMS:   Constitutional: +Fatigue, Denies fevers, chills or abnormal weight loss Eyes: +blurry vision Ears, nose, mouth, throat, and face: Denies mucositis or sore throat Respiratory: Denies cough, dyspnea or wheezes Cardiovascular: Denies palpitation, chest discomfort or lower extremity swelling Gastrointestinal:  Denies nausea, heartburn or change in bowel habits Skin: Denies abnormal skin rashes Lymphatics: Denies new lymphadenopathy or easy bruising Neurological: +Dizziness, +numbness and tingling in bilateral feet Denies new weaknesses Behavioral/Psych: Mood is stable, no new changes  All other systems were reviewed with the patient and are negative.   VITALS:  There were no vitals taken for this visit.  Wt Readings from Last 3 Encounters:  05/30/24 172 lb (78 kg)  05/25/24 170 lb (77.1 kg)  05/08/24 171 lb (77.6 kg)    There is no height or weight on file to calculate BMI.  Performance status (ECOG): 2 - Symptomatic, <50% confined to bed  PHYSICAL EXAM:   GENERAL:alert, no distress and comfortable SKIN: skin color, texture, turgor are normal, no rashes or significant lesions EYES: normal, Conjunctiva are pink and  non-injected, sclera clear OROPHARYNX:no exudate, no erythema and lips, buccal mucosa, and tongue normal  LYMPH:  Palpable right cervical level II lymphadenopathy 5 x 3cms, palpable right axillary lymphadenopathy 9 x 5 cms LUNGS: clear to auscultation and percussion with normal breathing effort HEART: regular rate & rhythm and no murmurs and no lower extremity edema ABDOMEN:abdomen soft, non-tender and normal bowel sounds Musculoskeletal:no cyanosis of digits and no clubbing  NEURO: alert & oriented x 3 with fluent speech, no focal motor/sensory deficits  LABORATORY DATA:  I have reviewed the data as listed    Component Value Date/Time   NA 139 05/30/2024 0808   NA 142 05/22/2022 0955   K 4.0 05/30/2024 0808   CL 102 05/30/2024 0808   CO2 26 05/30/2024 0808   GLUCOSE 101 (H) 05/30/2024 0808   BUN 14 05/30/2024 0808   BUN 13 05/22/2022 0955   CREATININE 0.89 05/30/2024 0808   CREATININE 1.10 05/08/2013 0938   CALCIUM  8.9 05/30/2024 0808   PROT 6.2 (L) 05/30/2024 0808   PROT 7.0 05/22/2022 0955   ALBUMIN 3.3 (L) 05/30/2024 0808   ALBUMIN 4.2 05/22/2022 0955   AST 22 05/30/2024 0808   ALT 15 05/30/2024 0808   ALKPHOS 82  05/30/2024 0808   BILITOT 0.7 05/30/2024 0808   BILITOT 0.6 05/22/2022 0955   GFRNONAA >60 05/30/2024 0808   GFRNONAA 64 05/08/2013 0938   GFRAA 69 11/27/2020 0852   GFRAA 74 05/08/2013 0938    Lab Results  Component Value Date   WBC 15.1 (H) 05/30/2024   NEUTROABS 9.6 (H) 05/30/2024   HGB 12.6 (L) 05/30/2024   HCT 39.3 05/30/2024   MCV 104.5 (H) 05/30/2024   PLT 292 05/30/2024    RADIOGRAPHIC STUDIES: I have personally reviewed the radiological images as listed and agreed with the findings in the report.  CT SOFT TISSUE NECK W CONTRAST CLINICAL DATA:  Follow-up squamous cell carcinoma of the head neck.  EXAM: CT NECK WITH CONTRAST  TECHNIQUE: Multidetector CT imaging of the neck was performed using the standard protocol following the bolus  administration of intravenous contrast.  RADIATION DOSE REDUCTION: This exam was performed according to the departmental dose-optimization program which includes automated exposure control, adjustment of the mA and/or kV according to patient size and/or use of iterative reconstruction technique.  CONTRAST:  75mL OMNIPAQUE  IOHEXOL  300 MG/ML  SOLN  COMPARISON:  03/07/2024.  12/30/2023.  10/12/2023.  FINDINGS: Pharynx and larynx: No definite submucosal or mucosal mass lesion. Again there is question of vocal fold paresis on the right, possibly with a small mass or polyp along the under surface of the right vocal fold.  Salivary glands: Parotid and submandibular glands are negative. Chronic atrophic changes of the submandibular glands.  Thyroid : No significant thyroid  finding.  Lymph nodes: On the left, there is no change in the appearance of level 2 level 3 junction lymph nodes. Largest node again measures 14 x 18 mm. Two adjacent level 3/level 4 lymph nodes in the region of axial image 63 are unchanged, short axis dimensions 7 and 5 mm as seen previously. Small supraclavicular lymph nodes in the region of image 73 are stable with short axis dimension 5-6 mm.  On the right, 1 or 2 adjacent nodes in the level 3/level 4 region measures 20 x 27 mm, slightly enlarged over prior studies, with some internal low density. Cluster of level 4 lymph nodes continue to enlarge, maximal dimension 17 mm axial image 52. Cluster of lower level 3 nodes have also enlarged slightly.  Vascular: Atherosclerotic change at the carotid bifurcation regions as seen previously with potential for flow limiting disease.  Limited intracranial: Normal  Visualized orbits: Normal  Mastoids and visualized paranasal sinuses: No significant sinus finding.  Skeleton: Ordinary cervical degenerative changes most pronounced C5-6 and C6-7. No destructive lesion.  Upper chest: See results of same date chest  CT.  Other: None  IMPRESSION: 1. Further slight enlargement of lymph nodes in the right level 3/level 4 region and in the lower level 3 region. 2. Stable appearance of lymph nodes on the left. 3. Question of vocal fold paresis on the right, possibly with a small mass or polyp along the under surface of the right vocal fold. 4. Atherosclerotic change at the carotid bifurcation regions as seen previously with potential for flow limiting disease.  Electronically Signed   By: Oneil Officer M.D.   On: 05/05/2024 14:40

## 2024-05-30 ENCOUNTER — Other Ambulatory Visit

## 2024-05-30 ENCOUNTER — Inpatient Hospital Stay

## 2024-05-30 ENCOUNTER — Ambulatory Visit: Admitting: Oncology

## 2024-05-30 ENCOUNTER — Inpatient Hospital Stay: Admitting: Oncology

## 2024-05-30 ENCOUNTER — Ambulatory Visit

## 2024-05-30 VITALS — BP 123/77 | HR 72 | Temp 97.6°F | Resp 16

## 2024-05-30 VITALS — BP 113/81 | HR 83 | Temp 97.8°F | Resp 18 | Wt 172.0 lb

## 2024-05-30 DIAGNOSIS — K7460 Unspecified cirrhosis of liver: Secondary | ICD-10-CM | POA: Diagnosis not present

## 2024-05-30 DIAGNOSIS — Z5189 Encounter for other specified aftercare: Secondary | ICD-10-CM | POA: Insufficient documentation

## 2024-05-30 DIAGNOSIS — C4442 Squamous cell carcinoma of skin of scalp and neck: Secondary | ICD-10-CM | POA: Diagnosis not present

## 2024-05-30 DIAGNOSIS — R5383 Other fatigue: Secondary | ICD-10-CM | POA: Insufficient documentation

## 2024-05-30 DIAGNOSIS — R590 Localized enlarged lymph nodes: Secondary | ICD-10-CM | POA: Diagnosis not present

## 2024-05-30 DIAGNOSIS — C7802 Secondary malignant neoplasm of left lung: Secondary | ICD-10-CM | POA: Insufficient documentation

## 2024-05-30 DIAGNOSIS — K56609 Unspecified intestinal obstruction, unspecified as to partial versus complete obstruction: Secondary | ICD-10-CM | POA: Diagnosis not present

## 2024-05-30 DIAGNOSIS — I4721 Torsades de pointes: Secondary | ICD-10-CM | POA: Diagnosis not present

## 2024-05-30 DIAGNOSIS — R21 Rash and other nonspecific skin eruption: Secondary | ICD-10-CM | POA: Insufficient documentation

## 2024-05-30 DIAGNOSIS — R530 Neoplastic (malignant) related fatigue: Secondary | ICD-10-CM | POA: Diagnosis not present

## 2024-05-30 DIAGNOSIS — Z5111 Encounter for antineoplastic chemotherapy: Secondary | ICD-10-CM | POA: Insufficient documentation

## 2024-05-30 DIAGNOSIS — I5022 Chronic systolic (congestive) heart failure: Secondary | ICD-10-CM | POA: Diagnosis not present

## 2024-05-30 DIAGNOSIS — N1831 Chronic kidney disease, stage 3a: Secondary | ICD-10-CM | POA: Diagnosis not present

## 2024-05-30 DIAGNOSIS — M2550 Pain in unspecified joint: Secondary | ICD-10-CM | POA: Diagnosis not present

## 2024-05-30 DIAGNOSIS — E872 Acidosis, unspecified: Secondary | ICD-10-CM | POA: Diagnosis not present

## 2024-05-30 DIAGNOSIS — C760 Malignant neoplasm of head, face and neck: Secondary | ICD-10-CM | POA: Diagnosis not present

## 2024-05-30 DIAGNOSIS — C9110 Chronic lymphocytic leukemia of B-cell type not having achieved remission: Secondary | ICD-10-CM | POA: Diagnosis not present

## 2024-05-30 DIAGNOSIS — K920 Hematemesis: Secondary | ICD-10-CM | POA: Diagnosis not present

## 2024-05-30 DIAGNOSIS — H538 Other visual disturbances: Secondary | ICD-10-CM | POA: Insufficient documentation

## 2024-05-30 DIAGNOSIS — I4891 Unspecified atrial fibrillation: Secondary | ICD-10-CM | POA: Diagnosis not present

## 2024-05-30 DIAGNOSIS — E861 Hypovolemia: Secondary | ICD-10-CM | POA: Diagnosis not present

## 2024-05-30 DIAGNOSIS — D72829 Elevated white blood cell count, unspecified: Secondary | ICD-10-CM | POA: Diagnosis not present

## 2024-05-30 DIAGNOSIS — Z515 Encounter for palliative care: Secondary | ICD-10-CM | POA: Diagnosis not present

## 2024-05-30 DIAGNOSIS — K56600 Partial intestinal obstruction, unspecified as to cause: Secondary | ICD-10-CM | POA: Diagnosis not present

## 2024-05-30 DIAGNOSIS — Z66 Do not resuscitate: Secondary | ICD-10-CM | POA: Diagnosis not present

## 2024-05-30 DIAGNOSIS — C801 Malignant (primary) neoplasm, unspecified: Secondary | ICD-10-CM | POA: Insufficient documentation

## 2024-05-30 DIAGNOSIS — I429 Cardiomyopathy, unspecified: Secondary | ICD-10-CM | POA: Diagnosis not present

## 2024-05-30 DIAGNOSIS — N179 Acute kidney failure, unspecified: Secondary | ICD-10-CM | POA: Diagnosis not present

## 2024-05-30 DIAGNOSIS — I4821 Permanent atrial fibrillation: Secondary | ICD-10-CM | POA: Diagnosis not present

## 2024-05-30 DIAGNOSIS — D638 Anemia in other chronic diseases classified elsewhere: Secondary | ICD-10-CM | POA: Diagnosis not present

## 2024-05-30 DIAGNOSIS — M4854XA Collapsed vertebra, not elsewhere classified, thoracic region, initial encounter for fracture: Secondary | ICD-10-CM | POA: Diagnosis not present

## 2024-05-30 DIAGNOSIS — C7801 Secondary malignant neoplasm of right lung: Secondary | ICD-10-CM | POA: Diagnosis not present

## 2024-05-30 DIAGNOSIS — K8020 Calculus of gallbladder without cholecystitis without obstruction: Secondary | ICD-10-CM | POA: Diagnosis not present

## 2024-05-30 DIAGNOSIS — I77810 Thoracic aortic ectasia: Secondary | ICD-10-CM | POA: Diagnosis not present

## 2024-05-30 DIAGNOSIS — R109 Unspecified abdominal pain: Secondary | ICD-10-CM | POA: Diagnosis not present

## 2024-05-30 DIAGNOSIS — C773 Secondary and unspecified malignant neoplasm of axilla and upper limb lymph nodes: Secondary | ICD-10-CM | POA: Diagnosis not present

## 2024-05-30 DIAGNOSIS — I130 Hypertensive heart and chronic kidney disease with heart failure and stage 1 through stage 4 chronic kidney disease, or unspecified chronic kidney disease: Secondary | ICD-10-CM | POA: Diagnosis not present

## 2024-05-30 DIAGNOSIS — E440 Moderate protein-calorie malnutrition: Secondary | ICD-10-CM | POA: Diagnosis not present

## 2024-05-30 DIAGNOSIS — Z95828 Presence of other vascular implants and grafts: Secondary | ICD-10-CM

## 2024-05-30 DIAGNOSIS — C778 Secondary and unspecified malignant neoplasm of lymph nodes of multiple regions: Secondary | ICD-10-CM | POA: Insufficient documentation

## 2024-05-30 DIAGNOSIS — E782 Mixed hyperlipidemia: Secondary | ICD-10-CM | POA: Diagnosis not present

## 2024-05-30 LAB — COMPREHENSIVE METABOLIC PANEL WITH GFR
ALT: 15 U/L (ref 0–44)
AST: 22 U/L (ref 15–41)
Albumin: 3.3 g/dL — ABNORMAL LOW (ref 3.5–5.0)
Alkaline Phosphatase: 82 U/L (ref 38–126)
Anion gap: 11 (ref 5–15)
BUN: 14 mg/dL (ref 8–23)
CO2: 26 mmol/L (ref 22–32)
Calcium: 8.9 mg/dL (ref 8.9–10.3)
Chloride: 102 mmol/L (ref 98–111)
Creatinine, Ser: 0.89 mg/dL (ref 0.61–1.24)
GFR, Estimated: >60 mL/min (ref >60)
Glucose, Bld: 101 mg/dL — ABNORMAL HIGH (ref 70–99)
Potassium: 4 mmol/L (ref 3.5–5.1)
Sodium: 139 mmol/L (ref 135–145)
Total Bilirubin: 0.7 mg/dL (ref 0.0–1.2)
Total Protein: 6.2 g/dL — ABNORMAL LOW (ref 6.5–8.1)

## 2024-05-30 LAB — CBC WITH DIFFERENTIAL/PLATELET
Abs Immature Granulocytes: 0.14 K/uL — ABNORMAL HIGH (ref 0.00–0.07)
Basophils Absolute: 0.1 K/uL (ref 0.0–0.1)
Basophils Relative: 1 %
Eosinophils Absolute: 0.1 K/uL (ref 0.0–0.5)
Eosinophils Relative: 1 %
HCT: 39.3 % (ref 39.0–52.0)
Hemoglobin: 12.6 g/dL — ABNORMAL LOW (ref 13.0–17.0)
Immature Granulocytes: 1 %
Lymphocytes Relative: 26 %
Lymphs Abs: 4 K/uL (ref 0.7–4.0)
MCH: 33.5 pg (ref 26.0–34.0)
MCHC: 32.1 g/dL (ref 30.0–36.0)
MCV: 104.5 fL — ABNORMAL HIGH (ref 80.0–100.0)
Monocytes Absolute: 1.3 K/uL — ABNORMAL HIGH (ref 0.1–1.0)
Monocytes Relative: 8 %
Neutro Abs: 9.6 K/uL — ABNORMAL HIGH (ref 1.7–7.7)
Neutrophils Relative %: 63 %
Platelets: 292 K/uL (ref 150–400)
RBC: 3.76 MIL/uL — ABNORMAL LOW (ref 4.22–5.81)
RDW: 14.4 % (ref 11.5–15.5)
WBC: 15.1 K/uL — ABNORMAL HIGH (ref 4.0–10.5)
nRBC: 0 % (ref 0.0–0.2)

## 2024-05-30 LAB — MAGNESIUM: Magnesium: 1.8 mg/dL (ref 1.7–2.4)

## 2024-05-30 MED ORDER — FAMOTIDINE IN NACL 20-0.9 MG/50ML-% IV SOLN
20.0000 mg | Freq: Once | INTRAVENOUS | Status: AC
  Administered 2024-05-30: 20 mg via INTRAVENOUS
  Filled 2024-05-30: qty 50

## 2024-05-30 MED ORDER — SODIUM CHLORIDE 0.9 % IV SOLN
400.0000 mg | Freq: Once | INTRAVENOUS | Status: AC
  Administered 2024-05-30: 400 mg via INTRAVENOUS
  Filled 2024-05-30: qty 40

## 2024-05-30 MED ORDER — CETIRIZINE HCL 10 MG/ML IV SOLN
10.0000 mg | Freq: Once | INTRAVENOUS | Status: AC
  Administered 2024-05-30: 10 mg via INTRAVENOUS
  Filled 2024-05-30: qty 1

## 2024-05-30 MED ORDER — SODIUM CHLORIDE 0.9 % IV SOLN: INTRAVENOUS | Status: DC

## 2024-05-30 MED ORDER — PALONOSETRON HCL INJECTION 0.25 MG/5ML
0.2500 mg | Freq: Once | INTRAVENOUS | Status: AC
  Administered 2024-05-30: 0.25 mg via INTRAVENOUS
  Filled 2024-05-30: qty 5

## 2024-05-30 MED ORDER — DEXAMETHASONE SODIUM PHOSPHATE 10 MG/ML IJ SOLN
10.0000 mg | Freq: Once | INTRAMUSCULAR | Status: AC
  Administered 2024-05-30: 10 mg via INTRAVENOUS
  Filled 2024-05-30: qty 1

## 2024-05-30 MED ORDER — SODIUM CHLORIDE 0.9 % IV SOLN
150.0000 mg | Freq: Once | INTRAVENOUS | Status: AC
  Administered 2024-05-30: 150 mg via INTRAVENOUS
  Filled 2024-05-30: qty 150

## 2024-05-30 NOTE — Assessment & Plan Note (Signed)
 Patient reports blurry vision is a complication of previous cetuximab  infusion  - Continue to follow with ophthalmology

## 2024-05-30 NOTE — Assessment & Plan Note (Addendum)
 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  -Patient tolerating treatment well.  Has no chemotherapy induced side effects at this time - Labs reviewed today.  CMP: WNL, CBC: Stable leukocytosis, no anemia or thrombocytopenia -Continue with treatment today. - Repeat CT scan in 3 months.  Due 07/2024  Return to clinic in 3 weeks with labs prior to next cycle of chemotherapy

## 2024-05-30 NOTE — Progress Notes (Signed)
 Patient has been examined by Dr. Davonna. Vital signs and labs have been reviewed by MD - ANC, Creatinine, LFTs, hemoglobin, and platelets have been reviewed by M.D. - pt may proceed with treatment.  Primary RN and pharmacy notified.

## 2024-05-30 NOTE — Assessment & Plan Note (Signed)
 Patient has history of CLL diagnosed in 2023.  RAI stage I No B symptoms  - Does not meet criteria for treatment at this time - Continue to monitor

## 2024-05-30 NOTE — Patient Instructions (Signed)

## 2024-05-30 NOTE — Assessment & Plan Note (Signed)
 Patient has cancer-related fatigue and is on Ritalin  5mg  as needed  - Continue Ritalin  5 mg as needed.

## 2024-05-30 NOTE — Assessment & Plan Note (Signed)
 Patient has cetuximab  induced rash. On doxycycline  and clindamycin  gel Cetuximab  discontinued at this time.  Rash significantly improved  - Can discontinue doxycycline  and clindamycin  gel at this time - Continue using moisturizing cream for skin dryness

## 2024-05-30 NOTE — Progress Notes (Signed)
 Patient presents today for Carboplatin  infusion. Patient is in satisfactory condition with no new complaints voiced.  Vital signs are stable.  Labs reviewed by Dr. Davonna during the office visit and all labs are within treatment parameters.  We will proceed with treatment per MD orders.   Treatment given today per MD orders. Tolerated infusion without adverse affects. Vital signs stable. No complaints at this time. Discharged from clinic via wheelchair in stable condition. Alert and oriented x 3. F/U with [facility] as scheduled.

## 2024-05-30 NOTE — Assessment & Plan Note (Signed)
 Patient has a history of polyarthralgia likely secondary to previous treatments Was on prednisone  5 mg daily. Does not report any polyarthralgias at this time.  Can discontinue prednisone .

## 2024-05-30 NOTE — Patient Instructions (Signed)
 [facility] [place] - A DEPT OF [facility]  Discharge Instructions: Thank you for choosing [facility] to provide your oncology and hematology care.  If you have a lab appointment with the Cancer Center - please note that after April 8th, 2024, all labs will be drawn in the cancer center.  You do not have to check in or register with the main entrance as you have in the past but will complete your check-in in the cancer center.  Wear comfortable clothing and clothing appropriate for easy access to any Portacath or PICC line.   We strive to give you quality time with your provider. You may need to reschedule your appointment if you arrive late (15 or more minutes).  Arriving late affects you and other patients whose appointments are after yours.  Also, if you miss three or more appointments without notifying the office, you may be dismissed from the clinic at the provider's discretion.      For prescription refill requests, have your pharmacy contact our office and allow 72 hours for refills to be completed.    Today you received the following chemotherapy and/or immunotherapy agents Carboplatin       To help prevent nausea and vomiting after your treatment, we encourage you to take your nausea medication as directed.  Carboplatin  Injection What is this medication? CARBOPLATIN  (KAR boe pla tin) treats some types of cancer. It works by slowing down the growth of cancer cells. This medicine may be used for other purposes; ask your health care provider or pharmacist if you have questions. COMMON BRAND NAME(S): Paraplatin  What should I tell my care team before I take this medication? They need to know if you have any of these conditions: Blood disorders Hearing problems Kidney disease Recent or ongoing radiation therapy An unusual or allergic reaction to carboplatin , cisplatin, other medications, foods, dyes, or preservatives Pregnant or trying to get  pregnant Breast-feeding How should I use this medication? This medication is injected into a vein. It is given by your care team in a hospital or clinic setting. Talk to your care team about the use of this medication in children. Special care may be needed. Overdosage: If you think you have taken too much of this medicine contact a poison control center or emergency room at once. NOTE: This medicine is only for you. Do not share this medicine with others. What if I miss a dose? Keep appointments for follow-up doses. It is important not to miss your dose. Call your care team if you are unable to keep an appointment. What may interact with this medication? Medications for seizures Some antibiotics, such as amikacin, gentamicin, neomycin , streptomycin, tobramycin  Vaccines This list may not describe all possible interactions. Give your health care provider a list of all the medicines, herbs, non-prescription drugs, or dietary supplements you use. Also tell them if you smoke, drink alcohol , or use illegal drugs. Some items may interact with your medicine. What should I watch for while using this medication? Your condition will be monitored carefully while you are receiving this medication. You may need blood work while taking this medication. This medication may make you feel generally unwell. This is not uncommon, as chemotherapy can affect healthy cells as well as cancer cells. Report any side effects. Continue your course of treatment even though you feel ill unless your care team tells you to stop. In some cases, you may be given additional medications to help with side  effects. Follow all directions for their use. This medication may increase your risk of getting an infection. Call your care team for advice if you get a fever, chills, sore throat, or other symptoms of a cold or flu. Do not treat yourself. Try to avoid being around people who are sick. Avoid taking medications that contain  aspirin, acetaminophen , ibuprofen, naproxen, or ketoprofen unless instructed by your care team. These medications may hide a fever. Be careful brushing or flossing your teeth or using a toothpick because you may get an infection or bleed more easily. If you have any dental work done, tell your dentist you are receiving this medication. Talk to your care team if you wish to become pregnant or think you might be pregnant. This medication can cause serious birth defects. Talk to your care team about effective forms of contraception. Do not breast-feed while taking this medication. What side effects may I notice from receiving this medication? Side effects that you should report to your care team as soon as possible: Allergic reactions--skin rash, itching, hives, swelling of the face, lips, tongue, or throat Infection--fever, chills, cough, sore throat, wounds that don't heal, pain or trouble when passing urine, general feeling of discomfort or being unwell Low red blood cell level--unusual weakness or fatigue, dizziness, headache, trouble breathing Pain, tingling, or numbness in the hands or feet, muscle weakness, change in vision, confusion or trouble speaking, loss of balance or coordination, trouble walking, seizures Unusual bruising or bleeding Side effects that usually do not require medical attention (report to your care team if they continue or are bothersome): Hair loss Nausea Unusual weakness or fatigue Vomiting This list may not describe all possible side effects. Call your doctor for medical advice about side effects. You may report side effects to FDA at 1-800-FDA-1088. Where should I keep my medication? This medication is given in a hospital or clinic. It will not be stored at home. NOTE: This sheet is a summary. It may not cover all possible information. If you have questions about this medicine, talk to your doctor, pharmacist, or health care provider.  2024 Elsevier/Gold Standard  (2022-01-20 00:00:00)  BELOW ARE SYMPTOMS THAT SHOULD BE REPORTED IMMEDIATELY: *FEVER GREATER THAN 100.4 F (38 C) OR HIGHER *CHILLS OR SWEATING *NAUSEA AND VOMITING THAT IS NOT CONTROLLED WITH YOUR NAUSEA MEDICATION *UNUSUAL SHORTNESS OF BREATH *UNUSUAL BRUISING OR BLEEDING *URINARY PROBLEMS (pain or burning when urinating, or frequent urination) *BOWEL PROBLEMS (unusual diarrhea, constipation, pain near the anus) TENDERNESS IN MOUTH AND THROAT WITH OR WITHOUT PRESENCE OF ULCERS (sore throat, sores in mouth, or a toothache) UNUSUAL RASH, SWELLING OR PAIN  UNUSUAL VAGINAL DISCHARGE OR ITCHING   Items with * indicate a potential emergency and should be followed up as soon as possible or go to the Emergency Department if any problems should occur.  Please show the CHEMOTHERAPY ALERT CARD or IMMUNOTHERAPY ALERT CARD at check-in to the Emergency Department and triage nurse.  Should you have questions after your visit or need to cancel or reschedule your appointment, please contact [facility]  - A DEPT OF [facility] [phone]  and follow the prompts.  Office hours are 8:00 a.m. to 4:30 p.m. Monday - Friday. Please note that voicemails left after 4:00 p.m. may not be returned until the following business day.  We are closed weekends and major holidays. You have access to a nurse at all times for urgent questions. Please call the main number to the clinic  [phone] and follow the prompts.  For any non-urgent questions, you may also contact your provider using MyChart. We now offer e-Visits for anyone 60 and older to request care online for non-urgent symptoms. For details visit [email or web address].   Also download the MyChart app! Go to the app store, search MyChart, open the app, select [place], and log in with your MyChart username and password.

## 2024-05-31 ENCOUNTER — Inpatient Hospital Stay

## 2024-05-31 VITALS — BP 139/77 | HR 94 | Temp 96.9°F | Resp 20

## 2024-05-31 DIAGNOSIS — Z95828 Presence of other vascular implants and grafts: Secondary | ICD-10-CM

## 2024-05-31 DIAGNOSIS — C4442 Squamous cell carcinoma of skin of scalp and neck: Secondary | ICD-10-CM

## 2024-05-31 MED ORDER — PEGFILGRASTIM-CBQV 6 MG/0.6ML ~~LOC~~ SOSY
6.0000 mg | PREFILLED_SYRINGE | Freq: Once | SUBCUTANEOUS | Status: AC
  Administered 2024-05-31: 6 mg via SUBCUTANEOUS
  Filled 2024-05-31: qty 0.6

## 2024-05-31 NOTE — Patient Instructions (Signed)
 [facility] [place] - A DEPT OF [facility]  Discharge Instructions: Thank you for choosing  [facility] to provide your oncology and hematology care.  If you have a lab appointment with the Cancer Center - please note that after April 8th, 2024, all labs will be drawn in the cancer center.  You do not have to check in or register with the main entrance as you have in the past but will complete your check-in in the cancer center.  Wear comfortable clothing and clothing appropriate for easy access to any Portacath or PICC line.   We strive to give you quality time with your provider. You may need to reschedule your appointment if you arrive late (15 or more minutes).  Arriving late affects you and other patients whose appointments are after yours.  Also, if you miss three or more appointments without notifying the office, you may be dismissed from the clinic at the provider's discretion.      For prescription refill requests, have your pharmacy contact our office and allow 72 hours for refills to be completed.    Today you received Udenyca  injection.   BELOW ARE SYMPTOMS THAT SHOULD BE REPORTED IMMEDIATELY: *FEVER GREATER THAN 100.4 F (38 C) OR HIGHER *CHILLS OR SWEATING *NAUSEA AND VOMITING THAT IS NOT CONTROLLED WITH YOUR NAUSEA MEDICATION *UNUSUAL SHORTNESS OF BREATH *UNUSUAL BRUISING OR BLEEDING *URINARY PROBLEMS (pain or burning when urinating, or frequent urination) *BOWEL PROBLEMS (unusual diarrhea, constipation, pain near the anus) TENDERNESS IN MOUTH AND THROAT WITH OR WITHOUT PRESENCE OF ULCERS (sore throat, sores in mouth, or a toothache) UNUSUAL RASH, SWELLING OR PAIN  UNUSUAL VAGINAL DISCHARGE OR ITCHING   Items with * indicate a potential emergency and should be followed up as soon as possible or go to the Emergency Department if any problems should occur.  Please show the CHEMOTHERAPY ALERT CARD or IMMUNOTHERAPY ALERT CARD at check-in to  the Emergency Department and triage nurse.  Should you have questions after your visit or need to cancel or reschedule your appointment, please contact [facility] [place] - A DEPT OF [facility] [phone]  and follow the prompts.  Office hours are 8:00 a.m. to 4:30 p.m. Monday - Friday. Please note that voicemails left after 4:00 p.m. may not be returned until the following business day.  We are closed weekends and major holidays. You have access to a nurse at all times for urgent questions. Please call the main number to the clinic [phone] and follow the prompts.  For any non-urgent questions, you may also contact your provider using MyChart. We now offer e-Visits for anyone 28 and older to request care online for non-urgent symptoms. For details visit [email or web address].   Also download the MyChart app! Go to the app store, search "MyChart", open the app, select , and log in with your MyChart username and password.

## 2024-05-31 NOTE — Progress Notes (Signed)
 [name] presents today for injection per the provider's orders.  Udenyca   administration without incident; injection site WNL; see MAR for injection details.  Patient tolerated procedure well and without incident.  No questions or complaints noted at this time.   Discharged from clinic via wheelchair in stable condition. Alert and oriented x 3. F/U with [facility] as scheduled.

## 2024-06-01 ENCOUNTER — Inpatient Hospital Stay (HOSPITAL_COMMUNITY)

## 2024-06-01 ENCOUNTER — Emergency Department (HOSPITAL_COMMUNITY)

## 2024-06-01 ENCOUNTER — Telehealth: Payer: Self-pay | Admitting: *Deleted

## 2024-06-01 ENCOUNTER — Inpatient Hospital Stay (HOSPITAL_COMMUNITY)
Admission: EM | Admit: 2024-06-01 | Discharge: 2024-06-10 | DRG: 389 | Disposition: A | Discharge status: Home Health | Attending: Internal Medicine | Admitting: Internal Medicine

## 2024-06-01 ENCOUNTER — Other Ambulatory Visit: Payer: Self-pay

## 2024-06-01 ENCOUNTER — Encounter (HOSPITAL_COMMUNITY): Payer: Self-pay | Admitting: Pharmacy Technician

## 2024-06-01 DIAGNOSIS — Z7901 Long term (current) use of anticoagulants: Secondary | ICD-10-CM

## 2024-06-01 DIAGNOSIS — E782 Mixed hyperlipidemia: Secondary | ICD-10-CM | POA: Diagnosis present

## 2024-06-01 DIAGNOSIS — I4819 Other persistent atrial fibrillation: Secondary | ICD-10-CM | POA: Diagnosis present

## 2024-06-01 DIAGNOSIS — Z8711 Personal history of peptic ulcer disease: Secondary | ICD-10-CM

## 2024-06-01 DIAGNOSIS — Z515 Encounter for palliative care: Secondary | ICD-10-CM | POA: Diagnosis not present

## 2024-06-01 DIAGNOSIS — C773 Secondary and unspecified malignant neoplasm of axilla and upper limb lymph nodes: Secondary | ICD-10-CM | POA: Diagnosis present

## 2024-06-01 DIAGNOSIS — E861 Hypovolemia: Secondary | ICD-10-CM | POA: Diagnosis present

## 2024-06-01 DIAGNOSIS — Z9049 Acquired absence of other specified parts of digestive tract: Secondary | ICD-10-CM

## 2024-06-01 DIAGNOSIS — R079 Chest pain, unspecified: Secondary | ICD-10-CM | POA: Diagnosis not present

## 2024-06-01 DIAGNOSIS — R530 Neoplastic (malignant) related fatigue: Secondary | ICD-10-CM | POA: Diagnosis present

## 2024-06-01 DIAGNOSIS — I4721 Torsades de pointes: Secondary | ICD-10-CM | POA: Diagnosis not present

## 2024-06-01 DIAGNOSIS — Z85828 Personal history of other malignant neoplasm of skin: Secondary | ICD-10-CM

## 2024-06-01 DIAGNOSIS — K8020 Calculus of gallbladder without cholecystitis without obstruction: Secondary | ICD-10-CM | POA: Diagnosis present

## 2024-06-01 DIAGNOSIS — R300 Dysuria: Secondary | ICD-10-CM | POA: Diagnosis present

## 2024-06-01 DIAGNOSIS — C760 Malignant neoplasm of head, face and neck: Secondary | ICD-10-CM | POA: Diagnosis not present

## 2024-06-01 DIAGNOSIS — R21 Rash and other nonspecific skin eruption: Secondary | ICD-10-CM | POA: Diagnosis present

## 2024-06-01 DIAGNOSIS — E440 Moderate protein-calorie malnutrition: Secondary | ICD-10-CM | POA: Insufficient documentation

## 2024-06-01 DIAGNOSIS — N1831 Chronic kidney disease, stage 3a: Secondary | ICD-10-CM | POA: Diagnosis present

## 2024-06-01 DIAGNOSIS — K6389 Other specified diseases of intestine: Secondary | ICD-10-CM | POA: Diagnosis not present

## 2024-06-01 DIAGNOSIS — R58 Hemorrhage, not elsewhere classified: Secondary | ICD-10-CM | POA: Diagnosis not present

## 2024-06-01 DIAGNOSIS — K920 Hematemesis: Secondary | ICD-10-CM | POA: Diagnosis not present

## 2024-06-01 DIAGNOSIS — K5669 Other partial intestinal obstruction: Secondary | ICD-10-CM | POA: Diagnosis not present

## 2024-06-01 DIAGNOSIS — K219 Gastro-esophageal reflux disease without esophagitis: Secondary | ICD-10-CM | POA: Diagnosis present

## 2024-06-01 DIAGNOSIS — Z87891 Personal history of nicotine dependence: Secondary | ICD-10-CM

## 2024-06-01 DIAGNOSIS — I130 Hypertensive heart and chronic kidney disease with heart failure and stage 1 through stage 4 chronic kidney disease, or unspecified chronic kidney disease: Secondary | ICD-10-CM | POA: Diagnosis not present

## 2024-06-01 DIAGNOSIS — I429 Cardiomyopathy, unspecified: Secondary | ICD-10-CM | POA: Diagnosis present

## 2024-06-01 DIAGNOSIS — Z66 Do not resuscitate: Secondary | ICD-10-CM | POA: Diagnosis not present

## 2024-06-01 DIAGNOSIS — Z8249 Family history of ischemic heart disease and other diseases of the circulatory system: Secondary | ICD-10-CM

## 2024-06-01 DIAGNOSIS — I959 Hypotension, unspecified: Secondary | ICD-10-CM | POA: Diagnosis present

## 2024-06-01 DIAGNOSIS — I2510 Atherosclerotic heart disease of native coronary artery without angina pectoris: Secondary | ICD-10-CM | POA: Diagnosis not present

## 2024-06-01 DIAGNOSIS — I25810 Atherosclerosis of coronary artery bypass graft(s) without angina pectoris: Secondary | ICD-10-CM | POA: Diagnosis not present

## 2024-06-01 DIAGNOSIS — C7802 Secondary malignant neoplasm of left lung: Secondary | ICD-10-CM | POA: Diagnosis present

## 2024-06-01 DIAGNOSIS — K7460 Unspecified cirrhosis of liver: Secondary | ICD-10-CM | POA: Diagnosis not present

## 2024-06-01 DIAGNOSIS — Z471 Aftercare following joint replacement surgery: Secondary | ICD-10-CM | POA: Diagnosis not present

## 2024-06-01 DIAGNOSIS — D72823 Leukemoid reaction: Secondary | ICD-10-CM | POA: Diagnosis present

## 2024-06-01 DIAGNOSIS — Z880 Allergy status to penicillin: Secondary | ICD-10-CM

## 2024-06-01 DIAGNOSIS — C9110 Chronic lymphocytic leukemia of B-cell type not having achieved remission: Secondary | ICD-10-CM | POA: Diagnosis present

## 2024-06-01 DIAGNOSIS — K56609 Unspecified intestinal obstruction, unspecified as to partial versus complete obstruction: Principal | ICD-10-CM | POA: Diagnosis present

## 2024-06-01 DIAGNOSIS — R140 Abdominal distension (gaseous): Secondary | ICD-10-CM | POA: Diagnosis not present

## 2024-06-01 DIAGNOSIS — Z823 Family history of stroke: Secondary | ICD-10-CM

## 2024-06-01 DIAGNOSIS — N200 Calculus of kidney: Secondary | ICD-10-CM | POA: Diagnosis not present

## 2024-06-01 DIAGNOSIS — I5022 Chronic systolic (congestive) heart failure: Secondary | ICD-10-CM | POA: Diagnosis not present

## 2024-06-01 DIAGNOSIS — M4854XA Collapsed vertebra, not elsewhere classified, thoracic region, initial encounter for fracture: Secondary | ICD-10-CM | POA: Diagnosis present

## 2024-06-01 DIAGNOSIS — I517 Cardiomegaly: Secondary | ICD-10-CM | POA: Diagnosis not present

## 2024-06-01 DIAGNOSIS — D72829 Elevated white blood cell count, unspecified: Secondary | ICD-10-CM

## 2024-06-01 DIAGNOSIS — Z96643 Presence of artificial hip joint, bilateral: Secondary | ICD-10-CM | POA: Diagnosis present

## 2024-06-01 DIAGNOSIS — E872 Acidosis, unspecified: Secondary | ICD-10-CM | POA: Diagnosis not present

## 2024-06-01 DIAGNOSIS — Z96651 Presence of right artificial knee joint: Secondary | ICD-10-CM | POA: Diagnosis present

## 2024-06-01 DIAGNOSIS — Z4682 Encounter for fitting and adjustment of non-vascular catheter: Secondary | ICD-10-CM | POA: Diagnosis not present

## 2024-06-01 DIAGNOSIS — R54 Age-related physical debility: Secondary | ICD-10-CM | POA: Diagnosis present

## 2024-06-01 DIAGNOSIS — Z951 Presence of aortocoronary bypass graft: Secondary | ICD-10-CM

## 2024-06-01 DIAGNOSIS — Z79899 Other long term (current) drug therapy: Secondary | ICD-10-CM

## 2024-06-01 DIAGNOSIS — I5023 Acute on chronic systolic (congestive) heart failure: Secondary | ICD-10-CM | POA: Diagnosis not present

## 2024-06-01 DIAGNOSIS — I4821 Permanent atrial fibrillation: Secondary | ICD-10-CM | POA: Diagnosis present

## 2024-06-01 DIAGNOSIS — D638 Anemia in other chronic diseases classified elsewhere: Secondary | ICD-10-CM | POA: Diagnosis present

## 2024-06-01 DIAGNOSIS — Z6825 Body mass index (BMI) 25.0-25.9, adult: Secondary | ICD-10-CM

## 2024-06-01 DIAGNOSIS — Z888 Allergy status to other drugs, medicaments and biological substances status: Secondary | ICD-10-CM

## 2024-06-01 DIAGNOSIS — N179 Acute kidney failure, unspecified: Secondary | ICD-10-CM | POA: Diagnosis not present

## 2024-06-01 DIAGNOSIS — C7801 Secondary malignant neoplasm of right lung: Secondary | ICD-10-CM | POA: Diagnosis not present

## 2024-06-01 DIAGNOSIS — I4891 Unspecified atrial fibrillation: Secondary | ICD-10-CM | POA: Diagnosis not present

## 2024-06-01 DIAGNOSIS — K56600 Partial intestinal obstruction, unspecified as to cause: Secondary | ICD-10-CM | POA: Diagnosis not present

## 2024-06-01 DIAGNOSIS — Z7189 Other specified counseling: Secondary | ICD-10-CM | POA: Diagnosis not present

## 2024-06-01 DIAGNOSIS — I77810 Thoracic aortic ectasia: Secondary | ICD-10-CM | POA: Diagnosis present

## 2024-06-01 DIAGNOSIS — R109 Unspecified abdominal pain: Secondary | ICD-10-CM | POA: Diagnosis present

## 2024-06-01 DIAGNOSIS — N400 Enlarged prostate without lower urinary tract symptoms: Secondary | ICD-10-CM | POA: Diagnosis present

## 2024-06-01 DIAGNOSIS — T451X5A Adverse effect of antineoplastic and immunosuppressive drugs, initial encounter: Secondary | ICD-10-CM | POA: Diagnosis present

## 2024-06-01 DIAGNOSIS — Z555 Less than a high school diploma: Secondary | ICD-10-CM

## 2024-06-01 DIAGNOSIS — Z5189 Encounter for other specified aftercare: Secondary | ICD-10-CM

## 2024-06-01 DIAGNOSIS — Z5111 Encounter for antineoplastic chemotherapy: Secondary | ICD-10-CM

## 2024-06-01 DIAGNOSIS — Z96642 Presence of left artificial hip joint: Secondary | ICD-10-CM | POA: Diagnosis present

## 2024-06-01 DIAGNOSIS — R590 Localized enlarged lymph nodes: Secondary | ICD-10-CM | POA: Diagnosis not present

## 2024-06-01 DIAGNOSIS — Z452 Encounter for adjustment and management of vascular access device: Secondary | ICD-10-CM | POA: Diagnosis not present

## 2024-06-01 LAB — COMPREHENSIVE METABOLIC PANEL WITH GFR
ALT: 17 U/L (ref 0–44)
AST: 29 U/L (ref 15–41)
Albumin: 3.4 g/dL — ABNORMAL LOW (ref 3.5–5.0)
Alkaline Phosphatase: 90 U/L (ref 38–126)
Anion gap: 16 — ABNORMAL HIGH (ref 5–15)
BUN: 25 mg/dL — ABNORMAL HIGH (ref 8–23)
CO2: 27 mmol/L (ref 22–32)
Calcium: 9.3 mg/dL (ref 8.9–10.3)
Chloride: 98 mmol/L (ref 98–111)
Creatinine, Ser: 1.28 mg/dL — ABNORMAL HIGH (ref 0.61–1.24)
GFR, Estimated: 53 mL/min — ABNORMAL LOW (ref >60)
Glucose, Bld: 199 mg/dL — ABNORMAL HIGH (ref 70–99)
Potassium: 3.9 mmol/L (ref 3.5–5.1)
Sodium: 141 mmol/L (ref 135–145)
Total Bilirubin: 0.9 mg/dL (ref 0.0–1.2)
Total Protein: 6.7 g/dL (ref 6.5–8.1)

## 2024-06-01 LAB — URINALYSIS, ROUTINE W REFLEX MICROSCOPIC
Bacteria, UA: NONE SEEN
Bilirubin Urine: NEGATIVE
Glucose, UA: NEGATIVE mg/dL
Ketones, ur: NEGATIVE mg/dL
Leukocytes,Ua: NEGATIVE
Nitrite: NEGATIVE
Protein, ur: 30 mg/dL — AB
Specific Gravity, Urine: >1.046 — ABNORMAL HIGH (ref 1.005–1.030)
pH: 6 (ref 5.0–8.0)

## 2024-06-01 LAB — CBC WITH DIFFERENTIAL/PLATELET
Abs Immature Granulocytes: 4 K/uL — ABNORMAL HIGH (ref 0.00–0.07)
Band Neutrophils: 7 %
Basophils Absolute: 0 K/uL (ref 0.0–0.1)
Basophils Relative: 0 %
Eosinophils Absolute: 0 K/uL (ref 0.0–0.5)
Eosinophils Relative: 0 %
HCT: 40.1 % (ref 39.0–52.0)
Hemoglobin: 13.4 g/dL (ref 13.0–17.0)
Lymphocytes Relative: 2 %
Lymphs Abs: 1.6 K/uL (ref 0.7–4.0)
MCH: 34.8 pg — ABNORMAL HIGH (ref 26.0–34.0)
MCHC: 33.4 g/dL (ref 30.0–36.0)
MCV: 104.2 fL — ABNORMAL HIGH (ref 80.0–100.0)
Metamyelocytes Relative: 3 %
Monocytes Absolute: 1.6 K/uL — ABNORMAL HIGH (ref 0.1–1.0)
Monocytes Relative: 2 %
Myelocytes: 2 %
Neutro Abs: 72.7 K/uL — ABNORMAL HIGH (ref 1.7–7.7)
Neutrophils Relative %: 84 %
Platelets: 315 K/uL (ref 150–400)
RBC: 3.85 MIL/uL — ABNORMAL LOW (ref 4.22–5.81)
RDW: 14.6 % (ref 11.5–15.5)
Smear Review: NORMAL
WBC: 79.9 K/uL (ref 4.0–10.5)
nRBC: 0 % (ref 0.0–0.2)

## 2024-06-01 LAB — ECHOCARDIOGRAM COMPLETE
Area-P 1/2: 12.64 cm2
Calc EF: 47.3 %
S' Lateral: 4.2 cm
Single Plane A2C EF: 47.7 %
Single Plane A4C EF: 46.6 %

## 2024-06-01 LAB — PROCALCITONIN: Procalcitonin: 0.17 ng/mL

## 2024-06-01 LAB — TYPE AND SCREEN
ABO/RH(D): B POS
Antibody Screen: NEGATIVE

## 2024-06-01 LAB — LACTIC ACID, PLASMA
Lactic Acid, Venous: 4.9 mmol/L (ref 0.5–1.9)
Lactic Acid, Venous: 5 mmol/L (ref 0.5–1.9)
Lactic Acid, Venous: 4.2 mmol/L (ref 0.5–1.9)

## 2024-06-01 LAB — TROPONIN I (HIGH SENSITIVITY)
Troponin I (High Sensitivity): 9 ng/L (ref <18)
Troponin I (High Sensitivity): 10 ng/L (ref <18)

## 2024-06-01 LAB — PROTIME-INR
INR: 2.1 — ABNORMAL HIGH (ref 0.8–1.2)
Prothrombin Time: 24.9 s — ABNORMAL HIGH (ref 11.4–15.2)

## 2024-06-01 MED ORDER — LACTATED RINGERS IV SOLN: INTRAVENOUS | Status: AC

## 2024-06-01 MED ORDER — DILTIAZEM LOAD VIA INFUSION
5.0000 mg | Freq: Once | INTRAVENOUS | Status: AC
  Administered 2024-06-01: 5 mg via INTRAVENOUS
  Filled 2024-06-01: qty 5

## 2024-06-01 MED ORDER — IOHEXOL 300 MG/ML  SOLN
100.0000 mL | Freq: Once | INTRAMUSCULAR | Status: AC | PRN
Start: 2024-06-01 — End: 2024-06-01
  Administered 2024-06-01: 100 mL via INTRAVENOUS

## 2024-06-01 MED ORDER — LACTATED RINGERS IV BOLUS
1000.0000 mL | Freq: Once | INTRAVENOUS | Status: AC
  Administered 2024-06-01: 1000 mL via INTRAVENOUS

## 2024-06-01 MED ORDER — SODIUM CHLORIDE 0.9 % IV BOLUS
500.0000 mL | Freq: Once | INTRAVENOUS | Status: AC
  Administered 2024-06-01: 500 mL via INTRAVENOUS

## 2024-06-01 MED ORDER — DILTIAZEM HCL-DEXTROSE 125-5 MG/125ML-% IV SOLN (PREMIX)
5.0000 mg/h | INTRAVENOUS | Status: DC
  Administered 2024-06-01 – 2024-06-02 (×2): 5 mg/h via INTRAVENOUS
  Filled 2024-06-01 (×2): qty 125

## 2024-06-01 MED ORDER — ONDANSETRON HCL 4 MG/2ML IJ SOLN
4.0000 mg | Freq: Once | INTRAMUSCULAR | Status: AC
  Administered 2024-06-01: 4 mg via INTRAVENOUS
  Filled 2024-06-01: qty 2

## 2024-06-01 MED ORDER — PANTOPRAZOLE SODIUM 40 MG IV SOLR
40.0000 mg | Freq: Two times a day (BID) | INTRAVENOUS | Status: DC
  Administered 2024-06-01 – 2024-06-10 (×17): 40 mg via INTRAVENOUS
  Filled 2024-06-01 (×19): qty 10

## 2024-06-01 MED ORDER — CHLORHEXIDINE GLUCONATE CLOTH 2 % EX PADS
6.0000 | MEDICATED_PAD | Freq: Every day | CUTANEOUS | Status: DC
  Administered 2024-06-02 – 2024-06-10 (×8): 6 via TOPICAL

## 2024-06-01 MED ORDER — PANTOPRAZOLE SODIUM 40 MG IV SOLR
40.0000 mg | Freq: Once | INTRAVENOUS | Status: AC
  Administered 2024-06-01: 40 mg via INTRAVENOUS
  Filled 2024-06-01: qty 10

## 2024-06-01 MED ORDER — FENTANYL CITRATE (PF) 100 MCG/2ML IJ SOLN
50.0000 ug | INTRAMUSCULAR | Status: DC | PRN
  Administered 2024-06-01 – 2024-06-05 (×11): 50 ug via INTRAVENOUS
  Filled 2024-06-01 (×11): qty 2

## 2024-06-01 MED ORDER — MORPHINE SULFATE (PF) 4 MG/ML IV SOLN
4.0000 mg | Freq: Once | INTRAVENOUS | Status: AC
  Administered 2024-06-01: 4 mg via INTRAVENOUS
  Filled 2024-06-01: qty 1

## 2024-06-01 NOTE — Hospital Course (Addendum)
 89 year old male with a history of coronary disease, hypertension, hyperlipidemia, persistent atrial fibrillation, squamous cell carcinoma of the neck, and CLL presenting with 2-day history of nausea, vomiting, and abdominal pain.  The patient most recently received his infusion of carboplatin  for his cancer on 05/30/2024.  On 05/13/2024, the patient received pegfilgrastim .  He began having abdominal pain with nausea and vomiting.  He endorsed vomiting some coffee grounds emesis but denies any frank bloody emesis.  There is no melena or hematochezia.  After he began vomiting, the patient began complaining of upper abdominal pain and chest pain.  He denies any fevers, chills, headache, coughing, hemoptysis, worsening shortness of breath.  He denies any hematuria but has some dysuria.  He denies any diarrhea.  His last bowel movement was 05/31/2024.  There is no hematochezia or melena. Because of worsening abdominal pain and vomiting he presented for further evaluation and treatment.  He denies any new medications. In the ED, the patient was afebrile and hemodynamically stable.  He was tachycardic in the 120s.  Oxygen  saturation was 95% room air.  WBC 79.9, hemoglobin 13.4, platelets 315.  Sodium 141, potassium 3.9, bicarbonate 27, serum creatinine 1.28.  LFTs were unremarkable.  CT of the abdomen and pelvis showed high-grade SBO with fluid distention of the stomach and small bowel to the distal portion of the small bowel.  There was liver cirrhosis.  There is cholelithiasis without biliary ductal dilatation.  Chest x-ray showed reticular opacities.  The patient was given a liter of normal saline.  He was given pantoprazole  and Zofran .  4 mg IV morphine  was given in the emergency department.  General surgery was consulted to assist management. NG decompression was initiated.  Patient was initially placed on diltiazem  drip for afib.  This was transitioned to amiodarone  drip in light of his EF 30-35%.

## 2024-06-01 NOTE — Telephone Encounter (Signed)
 Received call from wife stating that patient has been vomiting what appears to be coffee ground emesis since treatment yesterday.  Very weak and stomach extremely distended.  Verbalized chest pain and neck pain.  Advised to go to ER now.  Will notify Dr. Davonna of above.

## 2024-06-01 NOTE — ED Notes (Signed)
 Patient transported to CT

## 2024-06-01 NOTE — ED Notes (Signed)
 Per MD d/c order for orthostatics.

## 2024-06-01 NOTE — H&P (Signed)
 History and Physical    Patient: [name] FMW:[id number] DOB: 01-08-1932 DOA: 06/01/2024 DOS: the patient was seen and examined on 06/01/2024 PCP: [name], [name], MD  Patient coming from: Home  Chief Complaint:  Chief Complaint  Patient presents with   Abdominal Pain   Chest Pain   Hematemesis   HPI: [name] is a 89-year-old male with a history of coronary disease, hypertension, hyperlipidemia, persistent atrial fibrillation, squamous cell carcinoma of the neck, and CLL presenting with 2-day history of nausea, vomiting, and abdominal pain.  The patient most recently received his infusion of carboplatin  for his cancer on 05/30/2024.  On 05/13/2024, the patient received pegfilgrastim .  He began having abdominal pain with nausea and vomiting.  He endorsed vomiting some coffee grounds emesis but denies any frank bloody emesis.  There is no melena or hematochezia.  After he began vomiting, the patient began complaining of upper abdominal pain and chest pain.  He denies any fevers, chills, headache, coughing, hemoptysis, worsening shortness of breath.  He denies any hematuria but has some dysuria.  He denies any diarrhea.  His last bowel movement was 05/31/2024.  There is no hematochezia or melena. Because of worsening abdominal pain and vomiting he presented for further evaluation and treatment.  He denies any new medications. In the ED, the patient was afebrile and hemodynamically stable.  He was tachycardic in the 120s.  Oxygen  saturation was 95% room air.  WBC 79.9, hemoglobin 13.4, platelets 315.  Sodium 141, potassium 3.9, bicarbonate 27, serum creatinine 1.28.  LFTs were unremarkable.  CT of the abdomen and pelvis showed high-grade SBO with fluid distention of the stomach and small bowel to the distal portion of the small bowel.  There was liver cirrhosis.  There is cholelithiasis without biliary ductal dilatation.  Chest x-ray showed reticular opacities.  The patient was given a liter of  normal saline.  He was given pantoprazole  and Zofran .  4 mg IV morphine  was given in the emergency department.  General surgery was consulted to assist management. NG placement was recommended.  Review of Systems: As mentioned in the history of present illness. All other systems reviewed and are negative. Past Medical History:  Diagnosis Date   Anemia    Atrial fibrillation (HCC)    Cataract    Cellulitis    In past   Complication of anesthesia    HARD TIME WAKING; CAUSES MY BP TO GO UP    Coronary artery disease    5 bypasses   DJD (degenerative joint disease)    Ejection fraction < 50%    Mildly reduced, 40% by echo   GERD (gastroesophageal reflux disease)    Hyperlipidemia    Hypertension    Persistent atrial fibrillation (HCC)    Prostate hypertrophy    on CT scan 09/2014   PUD (peptic ulcer disease)    RESOLVED    Skin cancer of eyelid    Resected   Past Surgical History:  Procedure Laterality Date   APPENDECTOMY     BYPASS GRAFT  2005   CATARACT EXTRACTION W/PHACO Left 07/22/2015   Procedure: CATARACT EXTRACTION PHACO AND INTRAOCULAR LENS PLACEMENT LEFT EYE CDE=8.14;  Surgeon: Cherene Mania, MD;  Location: [facility];  Service: Ophthalmology;  Laterality: Left;   CATARACT EXTRACTION W/PHACO Right 08/18/2019   Procedure: CATARACT EXTRACTION PHACO AND INTRAOCULAR LENS PLACEMENT (IOC);  Surgeon: Harrie Agent, MD;  Location: [facility];  Service: Ophthalmology;  Laterality: Right;  CDE: 9.74   CORONARY  ARTERY BYPASS GRAFT  4/05   LIMA to LAD, SVG to PDA, SVG to ramus intermediate   EYE SURGERY  07/2015   EYELID CARCINOMA EXCISION     Skin cancer resected   Heart bypass  2005   LYMPH NODE BIOPSY Right 06/09/2022   Procedure: LYMPH NODE BIOPSY; supraclavicular;  Surgeon: Kallie Manuelita BROCKS, MD;  Location: [facility];  Service: General;  Laterality: Right;   PORTACATH PLACEMENT Left 06/09/2022   Procedure: INSERTION PORT-A-CATH;  Surgeon: Kallie Manuelita BROCKS, MD;  Location: [facility];   Service: General;  Laterality: Left;   TOTAL HIP ARTHROPLASTY     Right   TOTAL HIP ARTHROPLASTY Left 05/04/2018   Procedure: LEFT TOTAL HIP ARTHROPLASTY;  Surgeon: Heide Ingle, MD;  Location: [facility];  Service: Orthopedics;  Laterality: Left;   TOTAL KNEE ARTHROPLASTY     Right   Social History:  reports that he quit smoking about 60 years ago. His smoking use included cigarettes. He started smoking about 65 years ago. He has a 10 pack-year smoking history. He has never used smokeless tobacco. He reports that he does not drink alcohol  and does not use drugs.  Allergies  Allergen Reactions   Penicillins Hives and Rash    Has patient had a PCN reaction causing immediate rash, facial/tongue/throat swelling, SOB or lightheadedness with hypotension: Yes Has patient had a PCN reaction causing severe rash involving mucus membranes or skin necrosis: Yes Has patient had a PCN reaction that required hospitalization: No Has patient had a PCN reaction occurring within the last 10 years: No If all of the above answers are NO, then may proceed with Cephalosporin use.    Cetuximab  Cough    Cough and scratchy throat. Drug rechallenged and pt able to tolerated the rest of the infusion without any complications.    Hct [Hydrochlorothiazide] Other (See Comments)    hyper   Statins Other (See Comments)    Myalgia, tolerates low dosages of lovastatin      Family History  Problem Relation Age of Onset   Stroke Mother    Stroke Father    Hypertension Father    Early death Brother        25 months old   Cancer Brother        lung   Stroke Brother        heat    Prior to Admission medications   Medication Sig Start Date End Date Taking? Authorizing Provider  acetaminophen  (TYLENOL ) 500 MG tablet Take 1,000 mg by mouth every 6 (six) hours as needed for moderate pain.   Yes [provider]  Cholecalciferol (VITAMIN D3) 5000 units CAPS Take 5,000 Units by mouth once a week.    Yes  [provider]  clindamycin  (CLINDAGEL) 1 % gel Apply 1 Application topically daily at 6 (six) AM. 04/04/24  Yes [provider]  diltiazem  (CARDIZEM  CD) 120 MG 24 hr capsule TAKE ONE CAPSULE BY MOUTH DAILY 04/04/24  Yes [name], Fonda LABOR, MD  doxycycline  (VIBRA -TABS) 100 MG tablet Take 100 mg by mouth 2 (two) times daily. 05/01/24  Yes [provider]  ezetimibe  (ZETIA ) 10 MG tablet TAKE ONE (1) TABLET BY MOUTH EVERY DAY 05/01/24  Yes [name], Fonda LABOR, MD  finasteride  (PROSCAR ) 5 MG tablet TAKE ONE (1) TABLET EACH DAY 10/28/23  Yes [name], Fonda LABOR, MD  fluocinonide  cream (LIDEX ) 0.05 % Apply 1 Application topically 2 (two) times daily. 04/10/24  Yes Rogers Hai, MD  fluticasone  (FLONASE ) 50 MCG/ACT  nasal spray Place 1 spray into both nostrils daily as needed for allergies or rhinitis.   Yes [provider]  furosemide  (LASIX ) 20 MG tablet Take 1 tablet (20 mg total) by mouth daily. 10/28/23  Yes [name], Fonda LABOR, MD  lovastatin  (MEVACOR ) 40 MG tablet Take 1 tablet (40 mg total) by mouth at bedtime. 04/13/24  Yes [name], Fonda LABOR, MD  magnesium  oxide (MAG-OX) 400 (240 Mg) MG tablet Take 1 tablet (400 mg total) by mouth 2 (two) times daily. 05/08/24  Yes Rogers Hai, MD  methylphenidate  (RITALIN ) 5 MG tablet TAKE 1 TABLET BY MOUTH DAILY AS NEEDED 08/18/23  Yes Rogers Hai, MD  metoprolol  tartrate (LOPRESSOR ) 50 MG tablet Take 1 tablet (50 mg total) by mouth 2 (two) times daily. Patient taking differently: Take 50 mg by mouth daily. 05/13/23  Yes [name], Fonda LABOR, MD  montelukast  (SINGULAIR ) 10 MG tablet Take 1 tablet (10 mg total) by mouth at bedtime. 10/28/23  Yes [name], Fonda LABOR, MD  prochlorperazine  (COMPAZINE ) 10 MG tablet Take 1 tablet (10 mg total) by mouth every 6 (six) hours as needed for nausea or vomiting. 05/08/24  Yes Rogers Hai, MD  tobramycin  (TOBREX ) 0.3 % ophthalmic solution Place 1 drop into both eyes in  the morning, at noon, in the evening, and at bedtime. 01/25/24  Yes [provider]  warfarin (COUMADIN ) 5 MG tablet TAKE 1 TABLET ON SATURDAY, SUNDAY, TUESDAY, & THURSDAY. TAKE 1/2 TABLET ON MONDAY, WEDNESDAY, & FRIDAY Patient taking differently: Take 5 mg by mouth See admin instructions. Take 1 tablet on Sunday and Wednesday and then take 1/2 tablet on all other days. 04/13/24  Yes [name], Fonda LABOR, MD  trimethoprim-polymyxin b  (POLYTRIM) ophthalmic solution SMARTSIG:In Eye(s) Patient not taking: Reported on 06/01/2024 05/12/24   [provider]    Physical Exam: Vitals:   06/01/24 0955 06/01/24 0956 06/01/24 0957 06/01/24 1000  BP: 124/68     Pulse:  (!) 123 (!) 116   Resp:   19   Temp:    97.8 F (36.6 C)  SpO2:  93% 95%    GENERAL:  A&O x 3, NAD, well developed, cooperative, follows commands HEENT: /AT, No thrush, No icterus, No oral ulcers Neck:  No neck mass, No meningismus, soft, supple CV: RRR, no S3, no S4, no rub, no JVD Lungs: Diminished breath sounds bilateral.  Bibasilar rales.  No wheezing. Abd: soft/upper abdominal tenderness +BS, nondistended; no rebound Ext: No edema, no lymphangitis, no cyanosis, no rashes Neuro:  CN II-XII intact, strength 4/5 in RUE, RLE, strength 4/5 LUE, LLE; sensation intact bilateral; no dysmetria; babinski equivocal  Data Reviewed: Data reviewed above in the history  Assessment and Plan: Small bowel obstruction -Case was discussed with general surgery, Dr. Mavis - Plan for NG decompression - Continue IV fluids - Remain n.p.o. - Judicious opioids - Optimize electrolytes  Persistent atrial fibrillation with RVR - Start diltiazem  drip - Echocardiogram - Holding warfarin in the event of surgical intervention - INR 2.1 on the day of admission  Chronic HFmrEF - 04/05/2017 echo EF 40-45% - Repeat echo - Patient is clinically hypovolemic.  Leukemoid reaction - Likely secondary to pegfilgrastim  - No blasts noted on  differential - Obtain UA - Blood cultures x 2 sets  Lactic acidosis - Secondary to volume depletion - Give appropriate fluid boluses - Repeat lactate - Patient is afebrile and hemodynamically stable  Squamous cell carcinoma of the head and neck - Follow-up with medical oncology - Patient  last received carboplatin  on 05/30/2024 - Pegfilgrastim  was given 05/31/2024  CLL - Patient follows with medical oncology - Currently does not meet criteria for treatment - Currently under observation  CKD stage IIIa - Baseline creatinine 0.8-1.1  Essential hypertension -temporarily holding metoprolol  -Patient is on Cardizem  CD1 120 mg prior to admission -diltiazem  drip  Mixed hyperlipidemia - Holding statin and Zetia       Advance Care Planning: FULL  Consults: general surgery  Family Communication: daughter updated 8/21  Severity of Illness: The appropriate patient status for this patient is INPATIENT. Inpatient status is judged to be reasonable and necessary in order to provide the required intensity of service to ensure the patient's safety. The patient's presenting symptoms, physical exam findings, and initial radiographic and laboratory data in the context of their chronic comorbidities is felt to place them at high risk for further clinical deterioration. Furthermore, it is not anticipated that the patient will be medically stable for discharge from the hospital within 2 midnights of admission.   * I certify that at the point of admission it is my clinical judgment that the patient will require inpatient hospital care spanning beyond 2 midnights from the point of admission due to high intensity of service, high risk for further deterioration and high frequency of surveillance required.*  Author: Alm Schneider, MD 06/01/2024 1:01 PM  For on call review [email or web address].

## 2024-06-01 NOTE — ED Notes (Signed)
 MD Haviland notified of WBC result. No new orders at this time.

## 2024-06-01 NOTE — ED Notes (Signed)
MD notified of Lactic result.

## 2024-06-01 NOTE — ED Provider Notes (Signed)
 [place] [facility] AT [facility] Provider Note   CSN: [id number] Arrival date & time: 06/01/24  9052     Patient presents with: Abdominal Pain, Chest Pain, and Hematemesis   [name] is a 89 y.o. male.   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  Pt is on chemo and did have an infusion of carboplatin  on 8/19.  He has been having n/v since then.  Emesis is coffee ground colored.  Pt denies black stool.  No BRB in emesis.  No fevers.  He has cp and abd pain.        Prior to Admission medications   Medication Sig Start Date End Date Taking? Authorizing Provider  acetaminophen  (TYLENOL ) 500 MG tablet Take 1,000 mg by mouth every 6 (six) hours as needed for moderate pain.   Yes [provider]  Cholecalciferol (VITAMIN D3) 5000 units CAPS Take 5,000 Units by mouth once a week.    Yes [provider]  clindamycin  (CLINDAGEL) 1 % gel Apply 1 Application topically daily at 6 (six) AM. 04/04/24  Yes [provider]  diltiazem  (CARDIZEM  CD) 120 MG 24 hr capsule TAKE ONE CAPSULE BY MOUTH DAILY 04/04/24  Yes [name], [name], MD  doxycycline  (VIBRA -TABS) 100 MG tablet Take 100 mg by mouth 2 (two) times daily. 05/01/24  Yes [provider]  ezetimibe  (ZETIA ) 10 MG tablet TAKE ONE (1) TABLET BY MOUTH EVERY DAY 05/01/24  Yes [name], [name], MD  finasteride  (PROSCAR ) 5 MG tablet TAKE ONE (1) TABLET EACH DAY 10/28/23  Yes [name], [name], MD  fluocinonide  cream (LIDEX ) 0.05 % Apply 1 Application topically 2 (two) times daily. 04/10/24  Yes Rogers Hai, MD  fluticasone  (FLONASE ) 50 MCG/ACT nasal spray Place 1 spray into both nostrils daily as needed for allergies or rhinitis.   Yes [provider]  furosemide  (LASIX ) 20 MG tablet Take 1 tablet (20 mg total) by mouth daily. 10/28/23  Yes [name],  [name], MD  lovastatin  (MEVACOR ) 40 MG tablet Take 1 tablet (40 mg total) by mouth at bedtime. 04/13/24  Yes [name], [name], MD  magnesium  oxide (MAG-OX) 400 (240 Mg) MG tablet Take 1 tablet (400 mg total) by mouth 2 (two) times daily. 05/08/24  Yes Rogers Hai, MD  methylphenidate  (RITALIN ) 5 MG tablet TAKE 1 TABLET BY MOUTH DAILY AS NEEDED 08/18/23  Yes Rogers Hai, MD  metoprolol  tartrate (LOPRESSOR ) 50 MG tablet Take 1 tablet (50 mg total) by mouth 2 (two) times daily. Patient taking differently: Take 50 mg by mouth daily. 05/13/23  Yes [name], [name], MD  montelukast  (SINGULAIR ) 10 MG tablet Take 1 tablet (10 mg total) by mouth at bedtime. 10/28/23  Yes [name], [name], MD  prochlorperazine  (COMPAZINE ) 10 MG tablet Take 1 tablet (10 mg total) by mouth every 6 (six) hours as needed for nausea or vomiting. 05/08/24  Yes Rogers Hai, MD  tobramycin  (TOBREX ) 0.3 % ophthalmic solution Place 1 drop into both eyes in the morning, at noon, in the evening, and at bedtime. 01/25/24  Yes [provider]  warfarin (COUMADIN ) 5 MG tablet TAKE 1 TABLET ON SATURDAY, SUNDAY, TUESDAY, & THURSDAY. TAKE 1/2 TABLET ON MONDAY, WEDNESDAY, & FRIDAY Patient taking differently: Take 5 mg by mouth See admin instructions. Take 1 tablet on Sunday and Wednesday and then take 1/2  tablet on all other days. 04/13/24  Yes [name], Fonda LABOR, MD  trimethoprim-polymyxin b  (POLYTRIM) ophthalmic solution SMARTSIG:In Eye(s) Patient not taking: Reported on 06/01/2024 05/12/24   [provider]    Allergies: Penicillins, Cetuximab , Hct [hydrochlorothiazide], and Statins    Review of Systems  Cardiovascular:  Positive for chest pain.  Gastrointestinal:  Positive for anal bleeding, nausea and vomiting.  All other systems reviewed and are negative.   Updated Vital Signs BP 124/68   Pulse (!) 116   Temp 97.8 F (36.6 C)   Resp 19   SpO2 95%   Physical Exam Vitals and nursing  note reviewed.  Constitutional:      General: He is in acute distress.     Appearance: He is well-developed. He is obese. He is ill-appearing.  HENT:     Head: Normocephalic and atraumatic.     Mouth/Throat:     Mouth: Mucous membranes are dry.     Comments: Enlarged lns in neck Eyes:     Extraocular Movements: Extraocular movements intact.     Pupils: Pupils are equal, round, and reactive to light.  Cardiovascular:     Rate and Rhythm: Tachycardia present. Rhythm irregular.     Heart sounds: Normal heart sounds.  Pulmonary:     Effort: Pulmonary effort is normal.     Breath sounds: Normal breath sounds.  Abdominal:     General: Abdomen is flat. There is distension.     Tenderness: There is generalized abdominal tenderness.  Genitourinary:    Rectum: Guaiac result negative.  Skin:    General: Skin is warm.     Capillary Refill: Capillary refill takes less than 2 seconds.  Neurological:     General: No focal deficit present.     Mental Status: He is alert and oriented to person, place, and time.  Psychiatric:        Mood and Affect: Mood normal.        Behavior: Behavior normal.     (all labs ordered are listed, but only abnormal results are displayed) Labs Reviewed  COMPREHENSIVE METABOLIC PANEL WITH GFR - Abnormal; Notable for the following components:      Result Value   Glucose, Bld 199 (*)    BUN 25 (*)    Creatinine, Ser 1.28 (*)    Albumin 3.4 (*)    GFR, Estimated 53 (*)    Anion gap 16 (*)    All other components within normal limits  CBC WITH DIFFERENTIAL/PLATELET - Abnormal; Notable for the following components:   WBC 79.9 (*)    RBC 3.85 (*)    MCV 104.2 (*)    MCH 34.8 (*)    Neutro Abs 72.7 (*)    Monocytes Absolute 1.6 (*)    Abs Immature Granulocytes 4.00 (*)    All other components within normal limits  PROTIME-INR - Abnormal; Notable for the following components:   Prothrombin Time 24.9 (*)    INR 2.1 (*)    All other components within normal  limits  URINALYSIS, ROUTINE W REFLEX MICROSCOPIC  LACTIC ACID, PLASMA  LACTIC ACID, PLASMA  POC OCCULT BLOOD, ED  TYPE AND SCREEN  TROPONIN I (HIGH SENSITIVITY)  TROPONIN I (HIGH SENSITIVITY)    EKG: EKG Interpretation Date/Time:  Thursday June 01 2024 09:55:52 EDT Ventricular Rate:  118 PR Interval:    QRS Duration:  89 QT Interval:  320 QTC Calculation: 449 R Axis:   23  Text Interpretation: Atrial fibrillation Abnormal R-wave  progression, early transition Borderline repolarization abnormality Since last tracing rate slower Confirmed by Dean Clarity [phone]) on 06/01/2024 10:29:31 AM  Radiology: CT ABDOMEN PELVIS W CONTRAST Result Date: 06/01/2024 CLINICAL DATA:  Abdominal pain, acute nonlocalized.  Vomiting blood. EXAM: CT ABDOMEN AND PELVIS WITH CONTRAST TECHNIQUE: Multidetector CT imaging of the abdomen and pelvis was performed using the standard protocol following bolus administration of intravenous contrast. RADIATION DOSE REDUCTION: This exam was performed according to the departmental dose-optimization program which includes automated exposure control, adjustment of the mA and/or kV according to patient size and/or use of iterative reconstruction technique. CONTRAST:  OMNIPAQUE  IOHEXOL  300 MG/ML  SOLN COMPARISON:  CT chest 05/02/2024 FINDINGS: Lower chest: Chronic scarring at the lung bases. Known adenopathy in the right hilum. See results of previous chest CT. Hepatobiliary: Cirrhosis of the liver with enlarged left lobe and nodular surface of the liver. No sign of liver mass. Multiple calcified gallstones without CT evidence of cholecystitis or obstruction. Pancreas: Normal Spleen: Normal Adrenals/Urinary Tract: Adrenal glands are normal. No renal obstruction or worrisome lesion. Simple appearing cysts. Bladder poorly seen because of artifact from hip replacements. Stomach/Bowel: Fluid distension of the stomach and small intestine to the midportion of the small intestine,  with relative collapse of the small intestine distal to that. Findings consistent with high-grade small bowel obstruction. Specific etiology not specifically elucidated. Ordinary diverticulosis of the left colon without evidence of diverticulitis. Vascular/Lymphatic: Aortic atherosclerosis. No aneurysm. IVC is normal. No adenopathy. Reproductive: Normal other than enlarged prostate. Other: No free fluid or air. Musculoskeletal: subacute compression fracture of T12 with loss of height of 50%. No gross retropulsion. This was present in May. IMPRESSION: 1. High-grade small bowel obstruction with fluid distension of the stomach and small intestine to the midportion of the small intestine, with relative collapse of the small intestine distal to that. Specific etiology not elucidated. 2. Cirrhosis of the liver. 3. Cholelithiasis without CT evidence of cholecystitis or obstruction. 4. Aortic atherosclerosis. 5. Subacute compression fracture of T12 with loss of height of 50%. No gross retropulsion. This was present in May. 6. Known adenopathy in the right hilum. See results of previous chest CT. Aortic Atherosclerosis (ICD10-I70.0). Electronically Signed   By: Oneil Officer M.D.   On: 06/01/2024 11:33   DG Chest Port 1 View Result Date: 06/01/2024 EXAM: 1 VIEW XRAY OF THE CHEST 06/01/2024 10:10:00 AM COMPARISON: AP radiograph of the chest dated 06/09/2022. CLINICAL HISTORY: [id number] GI bleed [id number]. Pt bib ems from home with reports of vomiting blood. Family reports it looked like coffee ground emesis. Onset yesterday afternoon. Abdomen hard and distended which is new per family. Last chemo 2 days ago. Complaining of chest pain, reports that is ; where his cancer is. Afib on monitor. Given 4mg  zofran  en route. ; 120/60; HR 100-130; 93% RA. Hx of afib, [name], hypertension, CABG. Former smoker FINDINGS: LUNGS AND PLEURA: Coarse reticular opacities present within the mid and lower lung zones, which appear chronic. No focal  pulmonary opacity. No pulmonary edema. No pleural effusion. No pneumothorax. HEART AND MEDIASTINUM: The heart is enlarged. The patient is status post CABG. No acute abnormality of the cardiac and mediastinal silhouettes. BONES AND SOFT TISSUES: No acute osseous abnormality. A left subclavian chest border is present as before. IMPRESSION: 1. No acute findings. 2. Enlarged heart and status post CABG. 3. Chronic coarse reticular opacities in the mid and lower lung zones. Electronically signed by: Evalene Coho MD 06/01/2024 10:30 AM EDT RP Workstation: [id number]  Procedures   Medications Ordered in the ED  sodium chloride  0.9 % bolus 500 mL (has no administration in time range)  pantoprazole  (PROTONIX ) injection 40 mg (40 mg Intravenous Given 06/01/24 1027)  sodium chloride  0.9 % bolus 500 mL (500 mLs Intravenous New Bag/Given 06/01/24 1027)  ondansetron  (ZOFRAN ) injection 4 mg (4 mg Intravenous Given 06/01/24 1027)  morphine  (PF) 4 MG/ML injection 4 mg (4 mg Intravenous Given 06/01/24 1133)  iohexol  (OMNIPAQUE ) 300 MG/ML solution 100 mL (100 mLs Intravenous Contrast Given 06/01/24 1113)                                    Medical Decision Making Amount and/or Complexity of Data Reviewed Labs: ordered. Radiology: ordered.  Risk Prescription drug management. Decision regarding hospitalization.   This patient presents to the ED for concern of n/v, this involves an extensive number of treatment options, and is a complaint that carries with it a high risk of complications and morbidity.  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  The pertinent results include:  cbc with wbc elevated at 79.9 (hx CLL, but WBCs have been good for months.  He did receive pegfilgrastim  yesterday.  Hgb and plt nl; cmp with glucose elevated at 199, cr elevated at 1.28 (0.89 on 8/19); inr 2.1; trop nl   Imaging Studies ordered:  I ordered imaging studies including cxr and ct abd/pelvis  I independently visualized and interpreted imaging which showed  CXR:  No acute findings.  2. Enlarged heart and status post CABG.  3. Chronic coarse reticular opacities in the mid and lower lung zones.  CT abd/pelvis: . High-grade small bowel obstruction with fluid distension of the  stomach and small intestine to the midportion of the small  intestine, with relative collapse of the small intestine distal to  that. Specific etiology not elucidated.  2. Cirrhosis of the liver.  3. Cholelithiasis without CT evidence of cholecystitis or  obstruction.  4. Aortic atherosclerosis.  5. Subacute compression fracture of T12 with loss of height of 50%.  No gross retropulsion. This was present in May.  6. Known adenopathy in the right hilum. See results of previous  chest CT.    Aortic Atherosclerosis (ICD10-I70.0).   I agree with the radiologist interpretation   Cardiac Monitoring:  The patient was maintained on a cardiac monitor.  I personally viewed and interpreted the cardiac monitored which showed an underlying rhythm of: af   Medicines ordered and prescription drug management:  I ordered medication including ivfs/protonix /zofran   for sx  Reevaluation of the patient after these medicines showed that the patient improved I have reviewed the patients home medicines and have made adjustments as needed   Test Considered:  ct   Critical Interventions:  Ivfs, ng   Consultations Obtained:  I requested consultation with the surgeon (Dr. Mavis),  and  discussed lab and imaging findings as well as pertinent plan - he will consult. Pt d/w oncology Dr. Davonna who said the elevation in WBC  is likely from the pegfilgrastim . Pt d/w Dr. Evonnie ([facility]) for admission   Problem List / ED Course:  SBO:  surgery consulted.  NG placed. Leukocytosis:  from pegfilgrastim .  Verified with onc.  Afib:  HR improved with fluids.  His coumadin  will need to be held due to possible surgery.  CHA2DS2/VAS Stroke Risk Points  Current as of 20 minutes ago     5 >= 2 Points: High Risk  1 to 1.99 Points: Medium Risk  0 Points: Low Risk    Last Change: N/A      Details    This score determines the patient's risk of having a stroke if the  patient has atrial fibrillation.       Points Metrics  1 Has Congestive Heart Failure:  Yes    Current as of 20 minutes ago  1 Has Vascular Disease:  Yes    Current as of 20 minutes ago  1 Has Hypertension:  Yes    Current as of 20 minutes ago  2 Age:  52    Current as of 20 minutes ago  0 Has Diabetes Excluding Gestational Diabetes:  No    Current as of 20 minutes ago  0 Had Stroke:  No  Had TIA:  No  Had Thromboembolism:  No    Current as of 20 minutes ago  0 Male:  No    Current as of 20 minutes ago             Reevaluation:  After the interventions noted above, I reevaluated the patient and found that they have :improved   Social Determinants of Health:  Lives at home   Dispostion:  After consideration of the diagnostic results and the patients response to treatment, I feel that the patent would benefit from admission.       Final diagnoses:  Small bowel obstruction (HCC)  Anticoagulated on Coumadin   AKI (acute kidney injury) (HCC)  Leukocytosis, unspecified type  Permanent atrial fibrillation [facility])    ED Discharge Orders     None          Dean Clarity, MD 06/01/24 1242

## 2024-06-01 NOTE — ED Notes (Signed)
Pt aware we need urine specimen.  

## 2024-06-01 NOTE — ED Triage Notes (Signed)
 Pt bib ems from home with reports of vomiting blood. Family reports it looked like coffee ground emesis. Onset yesterday afternoon. Abdomen hard and distended which is new per family. Last chemo 2 days ago. Complaining of chest pain, reports that is where his cancer is. Afib on monitor. Given 4mg  zofran  en route.  120/60 HR 100-130 93% RA

## 2024-06-02 ENCOUNTER — Telehealth: Payer: Self-pay | Admitting: Physician Assistant

## 2024-06-02 DIAGNOSIS — D72823 Leukemoid reaction: Secondary | ICD-10-CM

## 2024-06-02 DIAGNOSIS — I4821 Permanent atrial fibrillation: Secondary | ICD-10-CM | POA: Diagnosis not present

## 2024-06-02 DIAGNOSIS — K56609 Unspecified intestinal obstruction, unspecified as to partial versus complete obstruction: Secondary | ICD-10-CM | POA: Diagnosis not present

## 2024-06-02 DIAGNOSIS — E872 Acidosis, unspecified: Secondary | ICD-10-CM

## 2024-06-02 DIAGNOSIS — I429 Cardiomyopathy, unspecified: Secondary | ICD-10-CM

## 2024-06-02 DIAGNOSIS — I25810 Atherosclerosis of coronary artery bypass graft(s) without angina pectoris: Secondary | ICD-10-CM

## 2024-06-02 DIAGNOSIS — I5022 Chronic systolic (congestive) heart failure: Secondary | ICD-10-CM

## 2024-06-02 DIAGNOSIS — Z7901 Long term (current) use of anticoagulants: Secondary | ICD-10-CM | POA: Diagnosis not present

## 2024-06-02 LAB — COMPREHENSIVE METABOLIC PANEL WITH GFR
ALT: 21 U/L (ref 0–44)
AST: 27 U/L (ref 15–41)
Albumin: 2.7 g/dL — ABNORMAL LOW (ref 3.5–5.0)
Alkaline Phosphatase: 74 U/L (ref 38–126)
Anion gap: 9 (ref 5–15)
BUN: 33 mg/dL — ABNORMAL HIGH (ref 8–23)
CO2: 26 mmol/L (ref 22–32)
Calcium: 8.6 mg/dL — ABNORMAL LOW (ref 8.9–10.3)
Chloride: 105 mmol/L (ref 98–111)
Creatinine, Ser: 1.03 mg/dL (ref 0.61–1.24)
GFR, Estimated: >60 mL/min (ref >60)
Glucose, Bld: 125 mg/dL — ABNORMAL HIGH (ref 70–99)
Potassium: 4 mmol/L (ref 3.5–5.1)
Sodium: 140 mmol/L (ref 135–145)
Total Bilirubin: 0.8 mg/dL (ref 0.0–1.2)
Total Protein: 5.1 g/dL — ABNORMAL LOW (ref 6.5–8.1)

## 2024-06-02 LAB — CBC WITH DIFFERENTIAL/PLATELET
Abs Immature Granulocytes: 2.1 K/uL — ABNORMAL HIGH (ref 0.00–0.07)
Band Neutrophils: 8 %
Basophils Absolute: 0 K/uL (ref 0.0–0.1)
Basophils Relative: 0 %
Eosinophils Absolute: 0 K/uL (ref 0.0–0.5)
Eosinophils Relative: 0 %
HCT: 33.8 % — ABNORMAL LOW (ref 39.0–52.0)
Hemoglobin: 10.9 g/dL — ABNORMAL LOW (ref 13.0–17.0)
Lymphocytes Relative: 9 %
Lymphs Abs: 6.2 K/uL — ABNORMAL HIGH (ref 0.7–4.0)
MCH: 34.2 pg — ABNORMAL HIGH (ref 26.0–34.0)
MCHC: 32.2 g/dL (ref 30.0–36.0)
MCV: 106 fL — ABNORMAL HIGH (ref 80.0–100.0)
Metamyelocytes Relative: 3 %
Monocytes Absolute: 7.5 K/uL — ABNORMAL HIGH (ref 0.1–1.0)
Monocytes Relative: 11 %
Neutro Abs: 52.7 K/uL — ABNORMAL HIGH (ref 1.7–7.7)
Neutrophils Relative %: 69 %
Platelets: 227 K/uL (ref 150–400)
RBC: 3.19 MIL/uL — ABNORMAL LOW (ref 4.22–5.81)
RDW: 14.6 % (ref 11.5–15.5)
WBC: 68.5 K/uL (ref 4.0–10.5)
nRBC: 0 % (ref 0.0–0.2)

## 2024-06-02 LAB — URINE CULTURE: Culture: <10000 — AB

## 2024-06-02 LAB — PROTIME-INR
INR: 2.2 — ABNORMAL HIGH (ref 0.8–1.2)
Prothrombin Time: 25.5 s — ABNORMAL HIGH (ref 11.4–15.2)

## 2024-06-02 LAB — POTASSIUM: Potassium: 3.7 mmol/L (ref 3.5–5.1)

## 2024-06-02 LAB — MAGNESIUM
Magnesium: 1.9 mg/dL (ref 1.7–2.4)
Magnesium: 1.9 mg/dL (ref 1.7–2.4)

## 2024-06-02 LAB — LACTIC ACID, PLASMA: Lactic Acid, Venous: 1.5 mmol/L (ref 0.5–1.9)

## 2024-06-02 LAB — MRSA NEXT GEN BY PCR, NASAL: MRSA by PCR Next Gen: NOT DETECTED

## 2024-06-02 MED ORDER — AMIODARONE HCL IN DEXTROSE 360-4.14 MG/200ML-% IV SOLN: 30.0000 mg/h | INTRAVENOUS | Status: DC

## 2024-06-02 MED ORDER — DIGOXIN 0.25 MG/ML IJ SOLN: 0.5000 mg | Freq: Four times a day (QID) | INTRAMUSCULAR | Status: AC

## 2024-06-02 MED ORDER — PHENOL 1.4 % MT LIQD
1.0000 | OROMUCOSAL | Status: DC | PRN
  Administered 2024-06-02 – 2024-06-10 (×6): 1 via OROMUCOSAL
  Filled 2024-06-02: qty 177

## 2024-06-02 MED ORDER — TOBRAMYCIN 0.3 % OP SOLN
1.0000 [drp] | Freq: Four times a day (QID) | OPHTHALMIC | Status: DC
  Administered 2024-06-02 – 2024-06-10 (×29): 1 [drp] via OPHTHALMIC
  Filled 2024-06-02 (×2): qty 5

## 2024-06-02 MED ORDER — MAGNESIUM SULFATE 2 GM/50ML IV SOLN
2.0000 g | Freq: Once | INTRAVENOUS | Status: AC
  Administered 2024-06-02: 2 g via INTRAVENOUS
  Filled 2024-06-02: qty 50

## 2024-06-02 MED ORDER — POLYVINYL ALCOHOL 1.4 % OP SOLN
1.0000 [drp] | Freq: Four times a day (QID) | OPHTHALMIC | Status: DC
  Administered 2024-06-03 – 2024-06-10 (×24): 1 [drp] via OPHTHALMIC
  Filled 2024-06-02 (×2): qty 15

## 2024-06-02 MED ORDER — DIGOXIN 0.25 MG/ML IJ SOLN
0.2500 mg | Freq: Four times a day (QID) | INTRAMUSCULAR | Status: AC
  Administered 2024-06-02: 0.25 mg via INTRAVENOUS
  Filled 2024-06-02: qty 2

## 2024-06-02 MED ORDER — DIGOXIN 0.25 MG/ML IJ SOLN
0.2500 mg | Freq: Four times a day (QID) | INTRAMUSCULAR | Status: AC
  Administered 2024-06-03: 0.25 mg via INTRAVENOUS
  Filled 2024-06-02: qty 2

## 2024-06-02 MED ORDER — AMIODARONE LOAD VIA INFUSION
150.0000 mg | Freq: Once | INTRAVENOUS | Status: AC
  Administered 2024-06-02: 150 mg via INTRAVENOUS
  Filled 2024-06-02: qty 83.34

## 2024-06-02 MED ORDER — POTASSIUM CHLORIDE 10 MEQ/100ML IV SOLN
10.0000 meq | INTRAVENOUS | Status: AC
  Administered 2024-06-02 (×4): 10 meq via INTRAVENOUS
  Filled 2024-06-02 (×4): qty 100

## 2024-06-02 MED ORDER — AMIODARONE HCL IN DEXTROSE 360-4.14 MG/200ML-% IV SOLN
60.0000 mg/h | INTRAVENOUS | Status: DC
  Administered 2024-06-02: 60 mg/h via INTRAVENOUS
  Filled 2024-06-02: qty 200

## 2024-06-02 MED ORDER — DIGOXIN 0.25 MG/ML IJ SOLN
INTRAMUSCULAR | Status: AC
  Administered 2024-06-02: 0.5 mg via INTRAVENOUS
  Filled 2024-06-02: qty 2

## 2024-06-02 NOTE — Plan of Care (Signed)
  Problem: Education: Goal: Knowledge of General Education information will improve Description: Including pain rating scale, medication(s)/side effects and non-pharmacologic comfort measures Outcome: Progressing   Problem: Health Behavior/Discharge Planning: Goal: Ability to manage health-related needs will improve Outcome: Progressing   Problem: Clinical Measurements: Goal: Ability to maintain clinical measurements within normal limits will improve Outcome: Not Progressing Goal: Will remain free from infection Outcome: Progressing Goal: Diagnostic test results will improve Outcome: Progressing Goal: Respiratory complications will improve Outcome: Progressing Goal: Cardiovascular complication will be avoided Outcome: Not Progressing   Problem: Activity: Goal: Risk for activity intolerance will decrease Outcome: Not Progressing   Problem: Nutrition: Goal: Adequate nutrition will be maintained Outcome: Not Progressing   Problem: Coping: Goal: Level of anxiety will decrease Outcome: Progressing   Problem: Elimination: Goal: Will not experience complications related to bowel motility Outcome: Progressing Goal: Will not experience complications related to urinary retention Outcome: Progressing   Problem: Pain Managment: Goal: General experience of comfort will improve and/or be controlled Outcome: Progressing   Problem: Safety: Goal: Ability to remain free from injury will improve Outcome: Progressing   Problem: Skin Integrity: Goal: Risk for impaired skin integrity will decrease Outcome: Progressing

## 2024-06-02 NOTE — Telephone Encounter (Signed)
 Opened in error

## 2024-06-02 NOTE — Consult Note (Signed)
 CARDIOLOGY CONSULT NOTE    Patient ID: [name]; [id number]; 11-27-1931   Admit date: 06/01/2024 Date of Consult: 06/02/2024  Primary Care Provider: [name], [name], [name] Primary Cardiologist:  Primary Electrophysiologist:     History of Present Illness:   [name] is a 89 year old M presented to the hospital with abdominal pain, nausea and vomiting x 1 to 2 days.  Imaging showed SBO.  Currently has NG tube for decompression.  General surgery on board.  Cardiology was consulted for A-fib with RVR.  Patient does not have any angina or DOE.  He has a history of permanent atrial fibrillation, on diltiazem  and Coumadin  at home.  I reviewed his echocardiogram from 2018 that showed LVEF 40 to 45%.  Echocardiogram yesterday showed LVEF 30 to 35%, severe dilatation of left atrium and right atrium, mild MR, no significant other valvular heart disease and CVP 8 mmHg.  Initially his lactic acid was severely elevated but with IV fluids, lactic acid significantly improved and currently normalized.  Currently Coumadin  on hold in anticipation for any surgical interventions.  INR therapeutic.  Past Medical History:  Diagnosis Date   Anemia    Atrial fibrillation (HCC)    Cataract    Cellulitis    In past   Complication of anesthesia    HARD TIME WAKING; CAUSES MY BP TO GO UP    Coronary artery disease    5 bypasses   DJD (degenerative joint disease)    Ejection fraction < 50%    Mildly reduced, 40% by echo   GERD (gastroesophageal reflux disease)    Hyperlipidemia    Hypertension    Persistent atrial fibrillation (HCC)    Prostate hypertrophy    on CT scan 09/2014   PUD (peptic ulcer disease)    RESOLVED    Skin cancer of eyelid    Resected    Past Surgical History:  Procedure Laterality Date   APPENDECTOMY     BYPASS GRAFT  2005   CATARACT EXTRACTION W/PHACO Left 07/22/2015   Procedure: CATARACT EXTRACTION PHACO AND INTRAOCULAR LENS PLACEMENT LEFT EYE CDE=8.14;  Surgeon:  Cherene Mania, [name];  Location: [facility];  Service: Ophthalmology;  Laterality: Left;   CATARACT EXTRACTION W/PHACO Right 08/18/2019   Procedure: CATARACT EXTRACTION PHACO AND INTRAOCULAR LENS PLACEMENT (IOC);  Surgeon: Harrie Agent, [name];  Location: [facility];  Service: Ophthalmology;  Laterality: Right;  CDE: 9.74   CORONARY ARTERY BYPASS GRAFT  4/05   LIMA to LAD, SVG to PDA, SVG to ramus intermediate   EYE SURGERY  07/2015   EYELID CARCINOMA EXCISION     Skin cancer resected   Heart bypass  2005   LYMPH NODE BIOPSY Right 06/09/2022   Procedure: LYMPH NODE BIOPSY; supraclavicular;  Surgeon: Kallie Manuelita BROCKS, [name];  Location: [facility];  Service: General;  Laterality: Right;   PORTACATH PLACEMENT Left 06/09/2022   Procedure: INSERTION PORT-A-CATH;  Surgeon: Kallie Manuelita BROCKS, [name];  Location: [facility];  Service: General;  Laterality: Left;   TOTAL HIP ARTHROPLASTY     Right   TOTAL HIP ARTHROPLASTY Left 05/04/2018   Procedure: LEFT TOTAL HIP ARTHROPLASTY;  Surgeon: Heide Ingle, [name];  Location: [facility];  Service: Orthopedics;  Laterality: Left;   TOTAL KNEE ARTHROPLASTY     Right       Inpatient Medications: Scheduled Meds:  artificial tears  1 drop Both Eyes Q6H   Chlorhexidine  Gluconate Cloth  6 each Topical Q0600   pantoprazole  (PROTONIX ) IV  40 mg Intravenous Q12H   tobramycin   1 drop Both Eyes Q6H   Continuous Infusions:  amiodarone  60 mg/hr (06/02/24 0838)   Followed by   amiodarone      lactated ringers  50 mL/hr at 06/02/24 0721   PRN Meds: fentaNYL  (SUBLIMAZE ) injection, phenol  Allergies:    Allergies  Allergen Reactions   Penicillins Hives and Rash    Has patient had a PCN reaction causing immediate rash, facial/tongue/throat swelling, SOB or lightheadedness with hypotension: Yes Has patient had a PCN reaction causing severe rash involving mucus membranes or skin necrosis: Yes Has patient had a PCN reaction that required hospitalization: No Has patient had a PCN reaction  occurring within the last 10 years: No If all of the above answers are NO, then may proceed with Cephalosporin use.    Cetuximab  Cough    Cough and scratchy throat. Drug rechallenged and pt able to tolerated the rest of the infusion without any complications.    Hct [Hydrochlorothiazide] Other (See Comments)    hyper   Statins Other (See Comments)    Myalgia, tolerates low dosages of lovastatin      Social History:   Social History   Socioeconomic History   Marital status: Married    Spouse name: [name]   Number of children: 1   Years of education: 10   Highest education level: 10th grade  Occupational History   Occupation: retired  Tobacco Use   Smoking status: Former    Current packs/day: 0.00    Average packs/day: 2.0 packs/day for 5.0 years (10.0 ttl pk-yrs)    Types: Cigarettes    Start date: 12/23/1958    Quit date: 12/23/1963    Years since quitting: 60.4   Smokeless tobacco: Never  Vaping Use   Vaping status: Never Used  Substance and Sexual Activity   Alcohol  use: No   Drug use: No   Sexual activity: Yes  Other Topics Concern   Not on file  Social History Narrative   Lives in a split level home.   Social Drivers of Corporate investment banker Strain: Low Risk  (04/09/2024)   Overall Financial Resource Strain (CARDIA)    Difficulty of Paying Living Expenses: Not hard at all  Food Insecurity: No Food Insecurity (06/01/2024)   Hunger Vital Sign    Worried About Running Out of Food in the Last Year: Never true    Ran Out of Food in the Last Year: Never true  Transportation Needs: No Transportation Needs (06/01/2024)   PRAPARE - Administrator, Civil Service (Medical): No    Lack of Transportation (Non-Medical): No  Physical Activity: Inactive (04/09/2024)   Exercise Vital Sign    Days of Exercise per Week: 0 days    Minutes of Exercise per Session: Not on file  Stress: No Stress Concern Present (04/09/2024)   [employer] of Occupational  Health - Occupational Stress Questionnaire    Feeling of Stress: Only a little  Social Connections: Moderately Integrated (06/01/2024)   Social Connection and Isolation Panel    Frequency of Communication with Friends and Family: Three times a week    Frequency of Social Gatherings with Friends and Family: Three times a week    Attends Religious Services: More than 4 times per year    Active Member of Clubs or Organizations: No    Attends Banker Meetings: Never    Marital Status: Married  Recent Concern: Social Connections - Moderately Isolated (04/09/2024)  Social Advertising account executive    Frequency of Communication with Friends and Family: More than three times a week    Frequency of Social Gatherings with Friends and Family: More than three times a week    Attends Religious Services: Never    Database administrator or Organizations: No    Attends Engineer, structural: Not on file    Marital Status: Married  Catering manager Violence: Not At Risk (06/01/2024)   Humiliation, Afraid, Rape, and Kick questionnaire    Fear of Current or Ex-Partner: No    Emotionally Abused: No    Physically Abused: No    Sexually Abused: No    Family History:    Family History  Problem Relation Age of Onset   Stroke Mother    Stroke Father    Hypertension Father    Early death Brother        42 months old   Cancer Brother        lung   Stroke Brother        heat     ROS:  Please see the history of present illness.  ROS  All other ROS reviewed and negative.     Physical Exam/Data:   Vitals:   06/02/24 0720 06/02/24 0757 06/02/24 0800 06/02/24 0900  BP: 114/71  (!) 99/58 105/73  Pulse: (!) 132  (!) 117 (!) 126  Resp: 19  18 (!) 24  Temp:  98.3 F (36.8 C)    TempSrc:  Oral    SpO2: 94%  94% 92%  Weight:      Height:        Intake/Output Summary (Last 24 hours) at 06/02/2024 1121 Last data filed at 06/02/2024 0757 Gross per 24 hour  Intake 674.1 ml   Output 1250 ml  Net -575.9 ml   Filed Weights   06/01/24 1902  Weight: 80.1 kg   Body mass index is 28.5 kg/m.  General:  Well nourished, well developed, in no acute distress HEENT: normal Lymph: no adenopathy Neck: no JVD Endocrine:  No thryomegaly Vascular: No carotid bruits; FA pulses 2+ bilaterally without bruits  Cardiac:  normal S1, S2; irregular rate and rhythm; no murmur  Lungs:  clear to auscultation bilaterally, no wheezing, rhonchi or rales  Abd: soft, nontender, no hepatomegaly  Ext: no edema Musculoskeletal:  No deformities, BUE and BLE strength normal and equal Skin: warm and dry  Neuro:  CNs 2-12 intact, no focal abnormalities noted Psych:  Normal affect   EKG:  The EKG was personally reviewed and demonstrates: A-fib with RVR Telemetry:  Telemetry was personally reviewed and demonstrates: A-fib with RVR  Relevant CV Studies:   Laboratory Data:  Chemistry Recent Labs  Lab 05/30/24 0808 06/01/24 1000 06/02/24 0252  NA 139 141 140  K 4.0 3.9 4.0  CL 102 98 105  CO2 26 27 26   GLUCOSE 101* 199* 125*  BUN 14 25* 33*  CREATININE 0.89 1.28* 1.03  CALCIUM  8.9 9.3 8.6*  GFRNONAA >60 53* >60  ANIONGAP 11 16* 9    Recent Labs  Lab 05/30/24 0808 06/01/24 1000 06/02/24 0252  PROT 6.2* 6.7 5.1*  ALBUMIN 3.3* 3.4* 2.7*  AST 22 29 27   ALT 15 17 21   ALKPHOS 82 90 74  BILITOT 0.7 0.9 0.8   Hematology Recent Labs  Lab 05/30/24 0808 06/01/24 1000 06/02/24 0252  WBC 15.1* 79.9* 68.5*  RBC 3.76* 3.85* 3.19*  HGB 12.6* 13.4 10.9*  HCT  39.3 40.1 33.8*  MCV 104.5* 104.2* 106.0*  MCH 33.5 34.8* 34.2*  MCHC 32.1 33.4 32.2  RDW 14.4 14.6 14.6  PLT 292 315 227   Cardiac EnzymesNo results for input(s): TROPONINI in the last 168 hours. No results for input(s): TROPIPOC in the last 168 hours.  BNPNo results for input(s): BNP, PROBNP in the last 168 hours.  DDimer No results for input(s): DDIMER in the last 168 hours.  Radiology/Studies:     Assessment and Plan:   Permanent atrial fibrillation with RVR - RVR likely in the setting of SBO. - Initially on diltiazem  drip, switched to amiodarone  drip due to soft blood pressures. - Continue amiodarone  drip. - On Coumadin  at home, currently held for any tentative surgical interventions. - Recommend to start heparin  drip when INR is less than 2.  INR 2.2 today. - Not a candidate for DCCV due to permanent nature of atrial fibrillation.  Cardiomyopathy, unknown etiology - LVEF 30 to 35% this admission, 40 to 45% in 2018.  - New drop in LVEF from 40% to 30%, likely stress-induced.  Compensated. - Does not have any symptoms of angina/DOE, will defer any ischemia evaluation. - On diltiazem  at home, will discontinue upon discharge. - Soft pressures currently, limiting GDMT initiation.  Lactic acidosis - Improved with IV fluids.  [name] s/p CABG - Not on aspirin due to Coumadin  use. - Resume lovastatin  40 mg nightly, when able.  Leukemoid reaction - Secondary to chemotherapy. - Management per primary/oncology.   CRITICAL CARE Performed by: Diannah SQUIBB Jamyiah Labella   Total critical care time: 30 minutes  Critical care time was exclusive of separately billable procedures and treating other patients.  Critical care was necessary to treat or prevent imminent or life-threatening deterioration.  Critical care was time spent personally by me on the following activities: development of treatment plan with patient and/or surrogate as well as nursing, discussions with consultants, evaluation of patient's response to treatment, examination of patient, obtaining history from patient or surrogate, ordering and performing treatments and interventions, ordering and review of laboratory studies, ordering and review of radiographic studies, pulse oximetry and re-evaluation of patient's condition.    For questions or updates, please contact [facility] Please consult [email or web address] for  contact info under Cardiology/STEMI.   Signed, Sabrinia Prien Priya Carl Butner, [name] 06/02/2024 11:21 AM

## 2024-06-02 NOTE — Progress Notes (Signed)
 Dr. Mallipeddi requested orders be placed to DC amiodarone  and start digoxin  load 0.5mg  x1 then 6 hours later 0.25mg  then 6 hours later 0.25mg  give episode of torsades, have entered in EMR.

## 2024-06-02 NOTE — Progress Notes (Signed)
 PROGRESS NOTE  [name] FMW:[id number] DOB: Dec 04, 1931 DOA: 06/01/2024 PCP: [name], [name], MD  Brief History:  89-year-old male with a history of coronary disease, hypertension, hyperlipidemia, persistent atrial fibrillation, squamous cell carcinoma of the neck, and CLL presenting with 2-day history of nausea, vomiting, and abdominal pain.  The patient most recently received his infusion of carboplatin  for his cancer on 05/30/2024.  On 05/13/2024, the patient received pegfilgrastim .  He began having abdominal pain with nausea and vomiting.  He endorsed vomiting some coffee grounds emesis but denies any frank bloody emesis.  There is no melena or hematochezia.  After he began vomiting, the patient began complaining of upper abdominal pain and chest pain.  He denies any fevers, chills, headache, coughing, hemoptysis, worsening shortness of breath.  He denies any hematuria but has some dysuria.  He denies any diarrhea.  His last bowel movement was 05/31/2024.  There is no hematochezia or melena. Because of worsening abdominal pain and vomiting he presented for further evaluation and treatment.  He denies any new medications. In the ED, the patient was afebrile and hemodynamically stable.  He was tachycardic in the 120s.  Oxygen  saturation was 95% room air.  WBC 79.9, hemoglobin 13.4, platelets 315.  Sodium 141, potassium 3.9, bicarbonate 27, serum creatinine 1.28.  LFTs were unremarkable.  CT of the abdomen and pelvis showed high-grade SBO with fluid distention of the stomach and small bowel to the distal portion of the small bowel.  There was liver cirrhosis.  There is cholelithiasis without biliary ductal dilatation.  Chest x-ray showed reticular opacities.  The patient was given a liter of normal saline.  He was given pantoprazole  and Zofran .  4 mg IV morphine  was given in the emergency department.  General surgery was consulted to assist management. NG decompression was initiated.  Patient  was initially placed on diltiazem  drip for afib.  This was transitioned to amiodarone  drip in light of his EF 30-35%.   Assessment/Plan: Small bowel obstruction -Case was discussed with general surgery, Dr. Mavis - continue NG decompression - Continue IV fluids - Remain n.p.o. - Judicious opioids - Optimize electrolytes - showing clinical improvement in last 24 hours   Persistent atrial fibrillation with RVR - initially diltiazem  drip>>amiodarone  drip due to soft BPs and low EF - - 06/01/24 echo EF 30-35% - Holding warfarin in the event of surgical intervention - INR 2.1>>2.2   Chronic HFrEF - 04/05/2017 echo EF 40-45% - 06/01/24 echo EF 30-35% - Patient initially clinically hypovolemic.   Leukemoid reaction - secondary to pegfilgrastim  and stress demargination - No blasts noted on differential - Obtain UA--no pyuria - Blood cultures x 2 sets - personally reviewed CXR--no infiltrates   Lactic acidosis - Secondary to volume depletion - Given appropriate fluid boluses - 5.0>>1.5 - Patient is afebrile and hemodynamically stable   Squamous cell carcinoma of the head and neck - Follow-up with medical oncology - Patient last received carboplatin  on 05/30/2024 - Pegfilgrastim  was given 05/31/2024   CLL - Patient follows with medical oncology - Currently does not meet criteria for treatment - Currently under observation   CKD stage IIIa - Baseline creatinine 0.8-1.1   Essential hypertension -temporarily holding metoprolol  -Patient is on Cardizem  CD 120 mg prior to admission -BP no soft   Mixed hyperlipidemia - Holding statin and Zetia         Family Communication:   daughter at bedside 8/22  Consultants:  general  surgery  Code Status:  FULL   DVT Prophylaxis:  warfarin   Procedures: As Listed in Progress Note Above  Antibiotics: None        Subjective: Pt states abd pain is improved.  States n/v are better. Denies f/c, cp, sob.  No flatus or BM  yest.  Denies headache  Objective: Vitals:   06/02/24 0600 06/02/24 0720 06/02/24 0757 06/02/24 0800  BP:  114/71  (!) 99/58  Pulse: (!) 109 (!) 132  (!) 117  Resp: (!) 21 19  18   Temp:   98.3 F (36.8 C)   TempSrc:   Oral   SpO2: 94% 94%  94%  Weight:      Height:        Intake/Output Summary (Last 24 hours) at 06/02/2024 0832 Last data filed at 06/02/2024 0757 Gross per 24 hour  Intake 674.1 ml  Output 1250 ml  Net -575.9 ml   Weight change:  Exam:  General:  Pt is alert, follows commands appropriately, not in acute distress HEENT: No icterus, No thrush, No neck mass, [place]/AT Cardiovascular: RRR, S1/S2, no rubs, no gallops Respiratory: bibasilar rales.  Diminished BS Abdomen: Soft/+BS, non tender, non distended, no guarding Extremities: No edema, No lymphangitis, No petechiae, No rashes, no synovitis   Data Reviewed: I have personally reviewed following labs and imaging studies Basic Metabolic Panel: Recent Labs  Lab 05/30/24 0808 06/01/24 1000 06/02/24 0252  NA 139 141 140  K 4.0 3.9 4.0  CL 102 98 105  CO2 26 27 26   GLUCOSE 101* 199* 125*  BUN 14 25* 33*  CREATININE 0.89 1.28* 1.03  CALCIUM  8.9 9.3 8.6*  MG 1.8  --  1.9   Liver Function Tests: Recent Labs  Lab 05/30/24 0808 06/01/24 1000 06/02/24 0252  AST 22 29 27   ALT 15 17 21   ALKPHOS 82 90 74  BILITOT 0.7 0.9 0.8  PROT 6.2* 6.7 5.1*  ALBUMIN 3.3* 3.4* 2.7*   No results for input(s): LIPASE, AMYLASE in the last 168 hours. No results for input(s): AMMONIA in the last 168 hours. Coagulation Profile: Recent Labs  Lab 06/01/24 1000 06/02/24 0252  INR 2.1* 2.2*   CBC: Recent Labs  Lab 05/30/24 0808 06/01/24 1000 06/02/24 0252  WBC 15.1* 79.9* 68.5*  NEUTROABS 9.6* 72.7* 52.7*  HGB 12.6* 13.4 10.9*  HCT 39.3 40.1 33.8*  MCV 104.5* 104.2* 106.0*  PLT 292 315 227   Cardiac Enzymes: No results for input(s): CKTOTAL, CKMB, CKMBINDEX, TROPONINI in the last 168  hours. BNP: Invalid input(s): POCBNP CBG: No results for input(s): GLUCAP in the last 168 hours. HbA1C: No results for input(s): HGBA1C in the last 72 hours. Urine analysis:    Component Value Date/Time   COLORURINE YELLOW 06/01/2024 1750   APPEARANCEUR CLEAR 06/01/2024 1750   APPEARANCEUR Clear 05/22/2022 0941   LABSPEC >1.046 (H) 06/01/2024 1750   PHURINE 6.0 06/01/2024 1750   GLUCOSEU NEGATIVE 06/01/2024 1750   HGBUR SMALL (A) 06/01/2024 1750   BILIRUBINUR NEGATIVE 06/01/2024 1750   BILIRUBINUR Negative 05/22/2022 0941   KETONESUR NEGATIVE 06/01/2024 1750   PROTEINUR 30 (A) 06/01/2024 1750   UROBILINOGEN negative 02/14/2015 1247   UROBILINOGEN 0.2 01/21/2015 0850   NITRITE NEGATIVE 06/01/2024 1750   LEUKOCYTESUR NEGATIVE 06/01/2024 1750   Sepsis Labs: @LABRCNTIP (procalcitonin:4,lacticidven:4) ) Recent Results (from the past 240 hours)  Culture, blood (Routine X 2) w Reflex to ID Panel     Status: None (Preliminary result)   Collection Time: 06/01/24  6:14 PM   Specimen: BLOOD  Result Value Ref Range Status   Specimen Description BLOOD RIGHT ANTECUBITAL  Final   Special Requests   Final    BOTTLES DRAWN AEROBIC ONLY Blood Culture adequate volume   Culture   Final    NO GROWTH < 12 HOURS Performed at [facility], [address] [place], [place] [address]    Report Status PENDING  Incomplete  Culture, blood (Routine X 2) w Reflex to ID Panel     Status: None (Preliminary result)   Collection Time: 06/01/24  6:14 PM   Specimen: BLOOD  Result Value Ref Range Status   Specimen Description BLOOD RIGHT ANTECUBITAL  Final   Special Requests   Final    BOTTLES DRAWN AEROBIC AND ANAEROBIC Blood Culture results may not be optimal due to an inadequate volume of blood received in culture bottles   Culture   Final    NO GROWTH < 12 HOURS Performed at [facility], [address], [place], [place] [address]    Report Status PENDING  Incomplete  MRSA Next Gen by PCR,  Nasal     Status: None   Collection Time: 06/01/24  6:38 PM   Specimen: Nasal Mucosa; Nasal Swab  Result Value Ref Range Status   MRSA by PCR Next Gen NOT DETECTED NOT DETECTED Final    Comment: (NOTE) The GeneXpert MRSA Assay (FDA approved for NASAL specimens only), is one component of a comprehensive MRSA colonization surveillance program. It is not intended to diagnose MRSA infection nor to guide or monitor treatment for MRSA infections. Test performance is not FDA approved in patients less than 34 years old. Performed at [facility], [address] [place], [place] [address]      Scheduled Meds:  amiodarone   150 mg Intravenous Once   Chlorhexidine  Gluconate Cloth  6 each Topical Q0600   pantoprazole  (PROTONIX ) IV  40 mg Intravenous Q12H   Continuous Infusions:  amiodarone      Followed by   amiodarone      lactated ringers  50 mL/hr at 06/02/24 9278    Procedures/Studies: DG Chest 1 View Result Date: 06/01/2024 CLINICAL DATA:  NG tube placement EXAM: CHEST  1 VIEW COMPARISON:  06/01/2024, 05/02/2024 FINDINGS: Sternotomy and cardiomegaly with strandy opacities at the bases. Enteric tube tip below the diaphragm, side-port in the region of the stomach, tip is non included. Marked air distension of the stomach IMPRESSION: Enteric tube tip below the diaphragm, side-port in the region of the stomach, tip is non included in the field of view. Marked air distension of the stomach. Electronically Signed   By: Luke Bun M.D.   On: 06/01/2024 20:16   DG Abdomen 1 View Result Date: 06/01/2024 CLINICAL DATA:  NG tube placement- had to put new one in due to getting pulled out. EXAM: ABDOMEN - 1 VIEW COMPARISON:  None Available. FINDINGS: The bowel gas pattern is non-obstructive. No evidence of pneumoperitoneum, within the limitations of a supine film. No acute osseous abnormalities. The soft tissues are within normal limits. Surgical changes, devices, tubes and lines: Interval placement of  nasogastric tube with its tip overlying the fundus of the stomach region. However, side hole is just above the left hemidiaphragm in the GE junction region. Recommend further advancement by 2-3 inches to confidently put the side-hole into the stomach. IMPRESSION: *Interval placement of nasogastric tube with its tip overlying the fundus of the stomach region. However, side hole is just above the left hemidiaphragm in the  GE junction region. Recommend further advancement by 2-3 inches to confidently put the side-hole into the stomach. Electronically Signed   By: Ree Molt M.D.   On: 06/01/2024 17:14   ECHOCARDIOGRAM COMPLETE Result Date: 06/01/2024    ECHOCARDIOGRAM REPORT   Patient Name:   CYNTHIA STAINBACK Date of Exam: 06/01/2024 Medical Rec #:  [id number]    Height:       66.0 in Accession #:    [id number]   Weight:       172.0 lb Date of Birth:  03-19-1932   BSA:          1.876 m Patient Age:    89 years     BP:           126/85 mmHg Patient Gender: M            HR:           107 bpm. Exam Location:  [facility] Procedure: 2D Echo, Cardiac Doppler and Color Doppler (Both Spectral and Color            Flow Doppler were utilized during procedure). Indications:    A-fib  History:        Patient has no prior history of Echocardiogram examinations.                 Arrythmias:Atrial Fibrillation; Risk Factors:Hypertension.  Sonographer:    Vella Key Referring Phys: Clarissa.Climes Imran Nuon IMPRESSIONS  1. Challenging study in the setting of tachycardia, HR 120-130s.  2. Left ventricular ejection fraction, by estimation, is 30 to 35%. The left ventricle has moderately decreased function. Left ventricular endocardial border not optimally defined to evaluate regional wall motion. Left ventricular diastolic function could not be evaluated.  3. Right ventricular systolic function was not well visualized. The right ventricular size is moderately enlarged. There is normal pulmonary artery systolic pressure.  4. Left atrial size was  severely dilated.  5. Right atrial size was severely dilated.  6. The mitral valve is grossly normal. Mild mitral valve regurgitation. No evidence of mitral stenosis.  7. The aortic valve was not well visualized. Aortic valve regurgitation is not visualized. No aortic stenosis is present.  8. Aortic dilatation noted. There is mild dilatation of the aortic root, measuring 40 mm.  9. The inferior vena cava is dilated in size with >50% respiratory variability, suggesting right atrial pressure of 8 mmHg. Comparison(s): No prior Echocardiogram. FINDINGS  Left Ventricle: Left ventricular ejection fraction, by estimation, is 30 to 35%. The left ventricle has moderately decreased function. Left ventricular endocardial border not optimally defined to evaluate regional wall motion. Strain was performed and the global longitudinal strain is indeterminate. The left ventricular internal cavity size was normal in size. There is no left ventricular hypertrophy. Left ventricular diastolic function could not be evaluated due to atrial fibrillation. Left ventricular diastolic function could not be evaluated. Right Ventricle: The right ventricular size is moderately enlarged. No increase in right ventricular wall thickness. Right ventricular systolic function was not well visualized. There is normal pulmonary artery systolic pressure. The tricuspid regurgitant velocity is 2.60 m/s, and with an assumed right atrial pressure of 8 mmHg, the estimated right ventricular systolic pressure is 35.0 mmHg. Left Atrium: Left atrial size was severely dilated. Right Atrium: Right atrial size was severely dilated. Pericardium: There is no evidence of pericardial effusion. Mitral Valve: The mitral valve is grossly normal. Mild mitral valve regurgitation. No evidence of mitral valve stenosis. Tricuspid Valve: The tricuspid  valve is normal in structure. Tricuspid valve regurgitation is mild . No evidence of tricuspid stenosis. Aortic Valve: The aortic  valve was not well visualized. Aortic valve regurgitation is not visualized. No aortic stenosis is present. Pulmonic Valve: The pulmonic valve was not well visualized. Pulmonic valve regurgitation is not visualized. No evidence of pulmonic stenosis. Aorta: Aortic dilatation noted. There is mild dilatation of the aortic root, measuring 40 mm. Venous: The inferior vena cava is dilated in size with greater than 50% respiratory variability, suggesting right atrial pressure of 8 mmHg. IAS/Shunts: No atrial level shunt detected by color flow Doppler. Additional Comments: 3D was performed not requiring image post processing on an independent workstation and was indeterminate.  LEFT VENTRICLE PLAX 2D LVIDd:         4.80 cm      Diastology LVIDs:         4.20 cm      LV e' medial:    4.35 cm/s LV PW:         1.20 cm      LV E/e' medial:  26.9 LV IVS:        1.10 cm      LV e' lateral:   3.92 cm/s LVOT diam:     1.70 cm      LV E/e' lateral: 29.8 LV SV:         25 LV SV Index:   13 LVOT Area:     2.27 cm  LV Volumes (MOD) LV vol d, MOD A2C: 111.0 ml LV vol d, MOD A4C: 96.9 ml LV vol s, MOD A2C: 58.0 ml LV vol s, MOD A4C: 51.7 ml LV SV MOD A2C:     53.0 ml LV SV MOD A4C:     96.9 ml LV SV MOD BP:      51.5 ml RIGHT VENTRICLE RV Basal diam:  4.60 cm RV S prime:     8.49 cm/s TAPSE (M-mode): 1.2 cm LEFT ATRIUM              Index        RIGHT ATRIUM           Index LA diam:        6.40 cm  3.41 cm/m   RA Area:     26.50 cm LA Vol (A2C):   111.0 ml 59.18 ml/m  RA Volume:   87.50 ml  46.65 ml/m LA Vol (A4C):   108.0 ml 57.58 ml/m LA Biplane Vol: 120.0 ml 63.98 ml/m  AORTIC VALVE LVOT Vmax:   64.60 cm/s LVOT Vmean:  48.900 cm/s LVOT VTI:    0.110 m  AORTA Ao Root diam: 4.00 cm MITRAL VALVE                TRICUSPID VALVE MV Area (PHT): 12.64 cm    TV Peak grad:   27.0 mmHg MV Decel Time: 60 msec      TV Vmax:        2.60 m/s MV E velocity: 117.00 cm/s  TR Peak grad:   27.0 mmHg MV A velocity: 50.70 cm/s   TR Vmax:         260.00 cm/s MV E/A ratio:  2.31                             SHUNTS  Systemic VTI:  0.11 m                             Systemic Diam: 1.70 cm Vishnu Priya Mallipeddi Electronically signed by Diannah Late Mallipeddi Signature Date/Time: 06/01/2024/4:13:53 PM    Final    DG Abdomen 1 View Result Date: 06/01/2024 CLINICAL DATA:  NG tube placement EXAM: ABDOMEN - 1 VIEW COMPARISON:  06/01/2024 FINDINGS: Weighted feeding tube terminates in the distended stomach. Redemonstrated findings of small-bowel obstruction. No pneumoperitoneum. No organomegaly or radiopaque calculi. Osteopenia. Bilateral hip arthroplasties. Cardiomegaly. Sternotomy wires and CABG markers. IMPRESSION: Weighted feeding tube terminates in the distended stomach. Electronically Signed   By: Rogelia Myers M.D.   On: 06/01/2024 13:54   CT ABDOMEN PELVIS W CONTRAST Result Date: 06/01/2024 CLINICAL DATA:  Abdominal pain, acute nonlocalized.  Vomiting blood. EXAM: CT ABDOMEN AND PELVIS WITH CONTRAST TECHNIQUE: Multidetector CT imaging of the abdomen and pelvis was performed using the standard protocol following bolus administration of intravenous contrast. RADIATION DOSE REDUCTION: This exam was performed according to the departmental dose-optimization program which includes automated exposure control, adjustment of the mA and/or kV according to patient size and/or use of iterative reconstruction technique. CONTRAST:  OMNIPAQUE  IOHEXOL  300 MG/ML  SOLN COMPARISON:  CT chest 05/02/2024 FINDINGS: Lower chest: Chronic scarring at the lung bases. Known adenopathy in the right hilum. See results of previous chest CT. Hepatobiliary: Cirrhosis of the liver with enlarged left lobe and nodular surface of the liver. No sign of liver mass. Multiple calcified gallstones without CT evidence of cholecystitis or obstruction. Pancreas: Normal Spleen: Normal Adrenals/Urinary Tract: Adrenal glands are normal. No renal obstruction or  worrisome lesion. Simple appearing cysts. Bladder poorly seen because of artifact from hip replacements. Stomach/Bowel: Fluid distension of the stomach and small intestine to the midportion of the small intestine, with relative collapse of the small intestine distal to that. Findings consistent with high-grade small bowel obstruction. Specific etiology not specifically elucidated. Ordinary diverticulosis of the left colon without evidence of diverticulitis. Vascular/Lymphatic: Aortic atherosclerosis. No aneurysm. IVC is normal. No adenopathy. Reproductive: Normal other than enlarged prostate. Other: No free fluid or air. Musculoskeletal: subacute compression fracture of T12 with loss of height of 50%. No gross retropulsion. This was present in May. IMPRESSION: 1. High-grade small bowel obstruction with fluid distension of the stomach and small intestine to the midportion of the small intestine, with relative collapse of the small intestine distal to that. Specific etiology not elucidated. 2. Cirrhosis of the liver. 3. Cholelithiasis without CT evidence of cholecystitis or obstruction. 4. Aortic atherosclerosis. 5. Subacute compression fracture of T12 with loss of height of 50%. No gross retropulsion. This was present in May. 6. Known adenopathy in the right hilum. See results of previous chest CT. Aortic Atherosclerosis (ICD10-I70.0). Electronically Signed   By: Oneil Officer M.D.   On: 06/01/2024 11:33   DG Chest Port 1 View Result Date: 06/01/2024 EXAM: 1 VIEW XRAY OF THE CHEST 06/01/2024 10:10:00 AM COMPARISON: AP radiograph of the chest dated 06/09/2022. CLINICAL HISTORY: [id number] GI bleed [id number]. Pt bib ems from home with reports of vomiting blood. Family reports it looked like coffee ground emesis. Onset yesterday afternoon. Abdomen hard and distended which is new per family. Last chemo 2 days ago. Complaining of chest pain, reports that is ; where his cancer is. Afib on monitor. Given 4mg  zofran  en route. ;  120/60; HR 100-130; 93% RA. Hx of afib,  [name], hypertension, CABG. Former smoker FINDINGS: LUNGS AND PLEURA: Coarse reticular opacities present within the mid and lower lung zones, which appear chronic. No focal pulmonary opacity. No pulmonary edema. No pleural effusion. No pneumothorax. HEART AND MEDIASTINUM: The heart is enlarged. The patient is status post CABG. No acute abnormality of the cardiac and mediastinal silhouettes. BONES AND SOFT TISSUES: No acute osseous abnormality. A left subclavian chest border is present as before. IMPRESSION: 1. No acute findings. 2. Enlarged heart and status post CABG. 3. Chronic coarse reticular opacities in the mid and lower lung zones. Electronically signed by: Evalene Coho MD 06/01/2024 10:30 AM EDT RP Workstation: DARYLENE Alm Schneider, DO  [facility]  If 7PM-7AM, please contact night-coverage [email or web address] Password [email or web address] 06/02/2024, 8:32 AM   LOS: 1 day

## 2024-06-02 NOTE — Progress Notes (Signed)
 Subjective: Patient states his abdominal pain has resolved.  He has not passed flatus or had a bowel movement yet.  Objective: Vital signs in last 24 hours: Temp:  [97.6 F (36.4 C)-98.2 F (36.8 C)] 97.6 F (36.4 C) (08/22 0400) Pulse Rate:  [101-132] 132 (08/22 0720) Resp:  [18-26] 19 (08/22 0720) BP: (84-156)/(38-92) 114/71 (08/22 0720) SpO2:  [89 %-96 %] 94 % (08/22 0720) Weight:  [80.1 kg] 80.1 kg (08/21 1902) Last BM Date : 05/31/24 (per patient)  Intake/Output from previous day: 08/21 0701 - 08/22 0700 In: 104.8 [I.V.:104.8] Out: 1100 [Urine:200; Emesis/NG output:900] Intake/Output this shift: Total I/O In: 569.3 [I.V.:569.3] Out: -   General appearance: alert, cooperative, and no distress GI: Soft with less distention noted.  No rigidity noted.  No tenderness noted.  Occasional bowel sounds appreciated.  Lab Results:  Recent Labs    06/01/24 1000 06/02/24 0252  WBC 79.9* 68.5*  HGB 13.4 10.9*  HCT 40.1 33.8*  PLT 315 227   BMET Recent Labs    06/01/24 1000 06/02/24 0252  NA 141 140  K 3.9 4.0  CL 98 105  CO2 27 26  GLUCOSE 199* 125*  BUN 25* 33*  CREATININE 1.28* 1.03  CALCIUM  9.3 8.6*   PT/INR Recent Labs    06/01/24 1000 06/02/24 0252  LABPROT 24.9* 25.5*  INR 2.1* 2.2*    Studies/Results: DG Chest 1 View Result Date: 06/01/2024 CLINICAL DATA:  NG tube placement EXAM: CHEST  1 VIEW COMPARISON:  06/01/2024, 05/02/2024 FINDINGS: Sternotomy and cardiomegaly with strandy opacities at the bases. Enteric tube tip below the diaphragm, side-port in the region of the stomach, tip is non included. Marked air distension of the stomach IMPRESSION: Enteric tube tip below the diaphragm, side-port in the region of the stomach, tip is non included in the field of view. Marked air distension of the stomach. Electronically Signed   By: Luke Bun M.D.   On: 06/01/2024 20:16   DG Abdomen 1 View Result Date: 06/01/2024 CLINICAL DATA:  NG tube placement-  had to put new one in due to getting pulled out. EXAM: ABDOMEN - 1 VIEW COMPARISON:  None Available. FINDINGS: The bowel gas pattern is non-obstructive. No evidence of pneumoperitoneum, within the limitations of a supine film. No acute osseous abnormalities. The soft tissues are within normal limits. Surgical changes, devices, tubes and lines: Interval placement of nasogastric tube with its tip overlying the fundus of the stomach region. However, side hole is just above the left hemidiaphragm in the GE junction region. Recommend further advancement by 2-3 inches to confidently put the side-hole into the stomach. IMPRESSION: *Interval placement of nasogastric tube with its tip overlying the fundus of the stomach region. However, side hole is just above the left hemidiaphragm in the GE junction region. Recommend further advancement by 2-3 inches to confidently put the side-hole into the stomach. Electronically Signed   By: Ree Molt M.D.   On: 06/01/2024 17:14   ECHOCARDIOGRAM COMPLETE Result Date: 06/01/2024    ECHOCARDIOGRAM REPORT   Patient Name:   [name] Date of Exam: 06/01/2024 Medical Rec #:  [id number]    Height:       66.0 in Accession #:    [id number]   Weight:       172.0 lb Date of Birth:  January 11, 1932   BSA:          1.876 m Patient Age:    89 years     BP:  126/85 mmHg Patient Gender: M            HR:           107 bpm. Exam Location:  [facility] Procedure: 2D Echo, Cardiac Doppler and Color Doppler (Both Spectral and Color            Flow Doppler were utilized during procedure). Indications:    A-fib  History:        Patient has no prior history of Echocardiogram examinations.                 Arrythmias:Atrial Fibrillation; Risk Factors:Hypertension.  Sonographer:    Vella Key Referring Phys: Clarissa.Climes DAVID TAT IMPRESSIONS  1. Challenging study in the setting of tachycardia, HR 120-130s.  2. Left ventricular ejection fraction, by estimation, is 30 to 35%. The left ventricle has moderately  decreased function. Left ventricular endocardial border not optimally defined to evaluate regional wall motion. Left ventricular diastolic function could not be evaluated.  3. Right ventricular systolic function was not well visualized. The right ventricular size is moderately enlarged. There is normal pulmonary artery systolic pressure.  4. Left atrial size was severely dilated.  5. Right atrial size was severely dilated.  6. The mitral valve is grossly normal. Mild mitral valve regurgitation. No evidence of mitral stenosis.  7. The aortic valve was not well visualized. Aortic valve regurgitation is not visualized. No aortic stenosis is present.  8. Aortic dilatation noted. There is mild dilatation of the aortic root, measuring 40 mm.  9. The inferior vena cava is dilated in size with >50% respiratory variability, suggesting right atrial pressure of 8 mmHg. Comparison(s): No prior Echocardiogram. FINDINGS  Left Ventricle: Left ventricular ejection fraction, by estimation, is 30 to 35%. The left ventricle has moderately decreased function. Left ventricular endocardial border not optimally defined to evaluate regional wall motion. Strain was performed and the global longitudinal strain is indeterminate. The left ventricular internal cavity size was normal in size. There is no left ventricular hypertrophy. Left ventricular diastolic function could not be evaluated due to atrial fibrillation. Left ventricular diastolic function could not be evaluated. Right Ventricle: The right ventricular size is moderately enlarged. No increase in right ventricular wall thickness. Right ventricular systolic function was not well visualized. There is normal pulmonary artery systolic pressure. The tricuspid regurgitant velocity is 2.60 m/s, and with an assumed right atrial pressure of 8 mmHg, the estimated right ventricular systolic pressure is 35.0 mmHg. Left Atrium: Left atrial size was severely dilated. Right Atrium: Right atrial  size was severely dilated. Pericardium: There is no evidence of pericardial effusion. Mitral Valve: The mitral valve is grossly normal. Mild mitral valve regurgitation. No evidence of mitral valve stenosis. Tricuspid Valve: The tricuspid valve is normal in structure. Tricuspid valve regurgitation is mild . No evidence of tricuspid stenosis. Aortic Valve: The aortic valve was not well visualized. Aortic valve regurgitation is not visualized. No aortic stenosis is present. Pulmonic Valve: The pulmonic valve was not well visualized. Pulmonic valve regurgitation is not visualized. No evidence of pulmonic stenosis. Aorta: Aortic dilatation noted. There is mild dilatation of the aortic root, measuring 40 mm. Venous: The inferior vena cava is dilated in size with greater than 50% respiratory variability, suggesting right atrial pressure of 8 mmHg. IAS/Shunts: No atrial level shunt detected by color flow Doppler. Additional Comments: 3D was performed not requiring image post processing on an independent workstation and was indeterminate.  LEFT VENTRICLE PLAX 2D LVIDd:  4.80 cm      Diastology LVIDs:         4.20 cm      LV e' medial:    4.35 cm/s LV PW:         1.20 cm      LV E/e' medial:  26.9 LV IVS:        1.10 cm      LV e' lateral:   3.92 cm/s LVOT diam:     1.70 cm      LV E/e' lateral: 29.8 LV SV:         25 LV SV Index:   13 LVOT Area:     2.27 cm  LV Volumes (MOD) LV vol d, MOD A2C: 111.0 ml LV vol d, MOD A4C: 96.9 ml LV vol s, MOD A2C: 58.0 ml LV vol s, MOD A4C: 51.7 ml LV SV MOD A2C:     53.0 ml LV SV MOD A4C:     96.9 ml LV SV MOD BP:      51.5 ml RIGHT VENTRICLE RV Basal diam:  4.60 cm RV S prime:     8.49 cm/s TAPSE (M-mode): 1.2 cm LEFT ATRIUM              Index        RIGHT ATRIUM           Index LA diam:        6.40 cm  3.41 cm/m   RA Area:     26.50 cm LA Vol (A2C):   111.0 ml 59.18 ml/m  RA Volume:   87.50 ml  46.65 ml/m LA Vol (A4C):   108.0 ml 57.58 ml/m LA Biplane Vol: 120.0 ml 63.98  ml/m  AORTIC VALVE LVOT Vmax:   64.60 cm/s LVOT Vmean:  48.900 cm/s LVOT VTI:    0.110 m  AORTA Ao Root diam: 4.00 cm MITRAL VALVE                TRICUSPID VALVE MV Area (PHT): 12.64 cm    TV Peak grad:   27.0 mmHg MV Decel Time: 60 msec      TV Vmax:        2.60 m/s MV E velocity: 117.00 cm/s  TR Peak grad:   27.0 mmHg MV A velocity: 50.70 cm/s   TR Vmax:        260.00 cm/s MV E/A ratio:  2.31                             SHUNTS                             Systemic VTI:  0.11 m                             Systemic Diam: 1.70 cm Vishnu Priya Mallipeddi Electronically signed by Diannah Late Mallipeddi Signature Date/Time: 06/01/2024/4:13:53 PM    Final    DG Abdomen 1 View Result Date: 06/01/2024 CLINICAL DATA:  NG tube placement EXAM: ABDOMEN - 1 VIEW COMPARISON:  06/01/2024 FINDINGS: Weighted feeding tube terminates in the distended stomach. Redemonstrated findings of small-bowel obstruction. No pneumoperitoneum. No organomegaly or radiopaque calculi. Osteopenia. Bilateral hip arthroplasties. Cardiomegaly. Sternotomy wires and CABG markers. IMPRESSION: Weighted feeding tube terminates in the distended stomach. Electronically Signed   By: Rogelia  Carlean M.D.   On: 06/01/2024 13:54   CT ABDOMEN PELVIS W CONTRAST Result Date: 06/01/2024 CLINICAL DATA:  Abdominal pain, acute nonlocalized.  Vomiting blood. EXAM: CT ABDOMEN AND PELVIS WITH CONTRAST TECHNIQUE: Multidetector CT imaging of the abdomen and pelvis was performed using the standard protocol following bolus administration of intravenous contrast. RADIATION DOSE REDUCTION: This exam was performed according to the departmental dose-optimization program which includes automated exposure control, adjustment of the mA and/or kV according to patient size and/or use of iterative reconstruction technique. CONTRAST:  OMNIPAQUE  IOHEXOL  300 MG/ML  SOLN COMPARISON:  CT chest 05/02/2024 FINDINGS: Lower chest: Chronic scarring at the lung bases. Known adenopathy  in the right hilum. See results of previous chest CT. Hepatobiliary: Cirrhosis of the liver with enlarged left lobe and nodular surface of the liver. No sign of liver mass. Multiple calcified gallstones without CT evidence of cholecystitis or obstruction. Pancreas: Normal Spleen: Normal Adrenals/Urinary Tract: Adrenal glands are normal. No renal obstruction or worrisome lesion. Simple appearing cysts. Bladder poorly seen because of artifact from hip replacements. Stomach/Bowel: Fluid distension of the stomach and small intestine to the midportion of the small intestine, with relative collapse of the small intestine distal to that. Findings consistent with high-grade small bowel obstruction. Specific etiology not specifically elucidated. Ordinary diverticulosis of the left colon without evidence of diverticulitis. Vascular/Lymphatic: Aortic atherosclerosis. No aneurysm. IVC is normal. No adenopathy. Reproductive: Normal other than enlarged prostate. Other: No free fluid or air. Musculoskeletal: subacute compression fracture of T12 with loss of height of 50%. No gross retropulsion. This was present in May. IMPRESSION: 1. High-grade small bowel obstruction with fluid distension of the stomach and small intestine to the midportion of the small intestine, with relative collapse of the small intestine distal to that. Specific etiology not elucidated. 2. Cirrhosis of the liver. 3. Cholelithiasis without CT evidence of cholecystitis or obstruction. 4. Aortic atherosclerosis. 5. Subacute compression fracture of T12 with loss of height of 50%. No gross retropulsion. This was present in May. 6. Known adenopathy in the right hilum. See results of previous chest CT. Aortic Atherosclerosis (ICD10-I70.0). Electronically Signed   By: Oneil Officer M.D.   On: 06/01/2024 11:33   DG Chest Port 1 View Result Date: 06/01/2024 EXAM: 1 VIEW XRAY OF THE CHEST 06/01/2024 10:10:00 AM COMPARISON: AP radiograph of the chest dated 06/09/2022.  CLINICAL HISTORY: [id number] GI bleed [id number]. Pt bib ems from home with reports of vomiting blood. Family reports it looked like coffee ground emesis. Onset yesterday afternoon. Abdomen hard and distended which is new per family. Last chemo 2 days ago. Complaining of chest pain, reports that is ; where his cancer is. Afib on monitor. Given 4mg  zofran  en route. ; 120/60; HR 100-130; 93% RA. Hx of afib, [name], hypertension, CABG. Former smoker FINDINGS: LUNGS AND PLEURA: Coarse reticular opacities present within the mid and lower lung zones, which appear chronic. No focal pulmonary opacity. No pulmonary edema. No pleural effusion. No pneumothorax. HEART AND MEDIASTINUM: The heart is enlarged. The patient is status post CABG. No acute abnormality of the cardiac and mediastinal silhouettes. BONES AND SOFT TISSUES: No acute osseous abnormality. A left subclavian chest border is present as before. IMPRESSION: 1. No acute findings. 2. Enlarged heart and status post CABG. 3. Chronic coarse reticular opacities in the mid and lower lung zones. Electronically signed by: Evalene Coho MD 06/01/2024 10:30 AM EDT RP Workstation: [id number]    Anti-infectives: Anti-infectives (From admission, onward)    None  Assessment/Plan: Impression: Small bowel obstruction, improved with NG tube decompression.  No need for acute surgical intervention at the present time.  Patient continues to be in rapid A-fib.  May get small bowel obstruction protocol study in next 24 to 48 hours.  His 2D echo showed an ejection fraction of 30 to 35%.  His leukocytosis is secondary to chemotherapy.  Should the patient require surgical intervention, this would need to be done in [place] given his multiple comorbidities.  Will discuss this with Dr. Evonnie.  LOS: 1 day    Oneil Budge 06/02/2024

## 2024-06-02 NOTE — TOC Initial Note (Signed)
 Transition of Care [facility]) - Initial/Assessment Note    Patient Details  Name: [name] MRN: [id number] Date of Birth: 11-22-1931  [facility] CM/SW Contact:    Lucie Lunger, LCSWA Phone Number: 06/02/2024, 10:34 AM  Clinical Narrative:                 Pt is high risk for readmission. CSW spoke with pt and family at bedside. Pt lives with family. Pt is independent in completing his ADLs and has assistance if needed. Pt states his family provides transportation as needed. Pt has not had HH in the past. Pt has a cane, walker and wheelchair to use when needed. TOC to follow.   Expected Discharge Plan: Home/Self Care Barriers to Discharge: Continued Medical Work up   Patient Goals and CMS Choice Patient states their goals for this hospitalization and ongoing recovery are:: from home CMS Medicare.gov Compare Post Acute Care list provided to:: Patient Choice offered to / list presented to : Patient      Expected Discharge Plan and Services In-house Referral: Clinical Social Work Discharge Planning Services: CM Consult   Living arrangements for the past 2 months: Single Family Home                                      Prior Living Arrangements/Services Living arrangements for the past 2 months: Single Family Home Lives with:: Spouse Patient language and need for interpreter reviewed:: Yes Do you feel safe going back to the place where you live?: Yes      Need for Family Participation in Patient Care: Yes (Comment) Care giver support system in place?: Yes (comment) Current home services: DME Criminal Activity/Legal Involvement Pertinent to Current Situation/Hospitalization: No - Comment as needed  Activities of Daily Living   ADL Screening (condition at time of admission) Independently performs ADLs?: Yes (appropriate for developmental age) Is the patient deaf or have difficulty hearing?: No Does the patient have difficulty seeing, even when wearing  glasses/contacts?: No Does the patient have difficulty concentrating, remembering, or making decisions?: No  Permission Sought/Granted                  Emotional Assessment Appearance:: Appears stated age Attitude/Demeanor/Rapport: Engaged Affect (typically observed): Accepting Orientation: : Oriented to Self, Oriented to Place, Oriented to Situation, Oriented to  Time Alcohol  / Substance Use: Not Applicable Psych Involvement: No (comment)  Admission diagnosis:  Small bowel obstruction (HCC) [K56.609] SBO (small bowel obstruction) (HCC) [K56.609] Permanent atrial fibrillation (HCC) [I48.21] AKI (acute kidney injury) (HCC) [N17.9] Anticoagulated on Coumadin  [Z79.01] Leukocytosis, unspecified type [D72.829] Patient Active Problem List   Diagnosis Date Noted   SBO (small bowel obstruction) (HCC) 06/01/2024   Chronic HFrEF (heart failure with reduced ejection fraction) (HCC) 06/01/2024   Permanent atrial fibrillation (HCC) 06/01/2024   CLL (chronic lymphocytic leukemia) (HCC) 05/30/2024   Fatigue 05/30/2024   Polyarthralgia 05/30/2024   Rash 05/30/2024   Blurry vision 05/30/2024   Port-A-Cath in place 07/22/2022   Squamous cell carcinoma of head and neck 07/16/2022   Cancer of unknown origin (HCC) 06/17/2022   Supraclavicular adenopathy 06/03/2022   Multiple nodules of lung 06/03/2022   Sepsis secondary to UTI (HCC) 05/08/2022   Dilated cardiomyopathy (HCC) 09/16/2021   Prediabetes 08/25/2019   CHF (congestive heart failure) (HCC)    H/O total hip arthroplasty, left 05/04/2018   Dyslipidemia 12/16/2017  Stenosis of left carotid artery 12/16/2017   Peripheral vascular insufficiency (HCC) 11/05/2017   Primary osteoarthritis involving multiple joints 11/05/2017   Vitamin D  deficiency 11/05/2017   Primary osteoarthritis of left knee 11/05/2017   Left-sided carotid artery disease (HCC) 06/02/2017   Coronary artery disease involving native coronary artery of native heart  without angina pectoris 06/02/2017   Metabolic syndrome 07/25/2015   [name] with elevated PSA 01/21/2015   Cataract of both eyes 05/25/2014   Long term current use of anticoagulant therapy 02/14/2013   Essential hypertension 02/25/2009   Coronary atherosclerosis 02/25/2009   Persistent atrial fibrillation (HCC) 02/25/2009   GERD 02/25/2009   PCP:  [name], Fonda LABOR, MD Pharmacy:   [facility] [place] [place] - [address] [place] [address] Phone: [phone] Fax: [phone]     Social Drivers of Health (SDOH) Social History: SDOH Screenings   Food Insecurity: No Food Insecurity (06/01/2024)  Housing: Low Risk  (06/01/2024)  Transportation Needs: No Transportation Needs (06/01/2024)  Utilities: Not At Risk (06/01/2024)  Alcohol  Screen: Low Risk  (01/06/2023)  Depression (PHQ2-9): Low Risk  (05/30/2024)  Recent Concern: Depression (PHQ2-9) - High Risk (04/13/2024)  Financial Resource Strain: Low Risk  (04/09/2024)  Physical Activity: Inactive (04/09/2024)  Social Connections: Moderately Integrated (06/01/2024)  Recent Concern: Social Connections - Moderately Isolated (04/09/2024)  Stress: No Stress Concern Present (04/09/2024)  Tobacco Use: Medium Risk (06/01/2024)   SDOH Interventions:     Readmission Risk Interventions    06/02/2024   10:32 AM  Readmission Risk Prevention Plan  Transportation Screening Complete  HRI or Home Care Consult Complete  Social Work Consult for Recovery Care Planning/Counseling Complete  Palliative Care Screening Not Applicable  Medication Review Oceanographer) Complete

## 2024-06-03 ENCOUNTER — Inpatient Hospital Stay (HOSPITAL_COMMUNITY)

## 2024-06-03 DIAGNOSIS — I4721 Torsades de pointes: Secondary | ICD-10-CM | POA: Diagnosis not present

## 2024-06-03 DIAGNOSIS — I4821 Permanent atrial fibrillation: Secondary | ICD-10-CM | POA: Diagnosis not present

## 2024-06-03 DIAGNOSIS — K56609 Unspecified intestinal obstruction, unspecified as to partial versus complete obstruction: Secondary | ICD-10-CM | POA: Diagnosis not present

## 2024-06-03 LAB — CBC WITH DIFFERENTIAL/PLATELET
Abs Immature Granulocytes: 10.19 K/uL — ABNORMAL HIGH (ref 0.00–0.07)
Basophils Absolute: 0 K/uL (ref 0.0–0.1)
Basophils Relative: 0 %
Eosinophils Absolute: 0 K/uL (ref 0.0–0.5)
Eosinophils Relative: 0 %
HCT: 35.9 % — ABNORMAL LOW (ref 39.0–52.0)
Hemoglobin: 12.1 g/dL — ABNORMAL LOW (ref 13.0–17.0)
Immature Granulocytes: 17 %
Lymphocytes Relative: 6 %
Lymphs Abs: 3.7 K/uL (ref 0.7–4.0)
MCH: 35.7 pg — ABNORMAL HIGH (ref 26.0–34.0)
MCHC: 33.7 g/dL (ref 30.0–36.0)
MCV: 105.9 fL — ABNORMAL HIGH (ref 80.0–100.0)
Monocytes Absolute: 1 K/uL (ref 0.1–1.0)
Monocytes Relative: 2 %
Neutro Abs: 43.9 K/uL — ABNORMAL HIGH (ref 1.7–7.7)
Neutrophils Relative %: 75 %
Platelets: 201 K/uL (ref 150–400)
RBC: 3.39 MIL/uL — ABNORMAL LOW (ref 4.22–5.81)
RDW: 14.5 % (ref 11.5–15.5)
WBC: 58.8 K/uL (ref 4.0–10.5)
nRBC: 0 % (ref 0.0–0.2)

## 2024-06-03 LAB — MAGNESIUM
Magnesium: 2.1 mg/dL (ref 1.7–2.4)
Magnesium: 1.9 mg/dL (ref 1.7–2.4)

## 2024-06-03 LAB — BASIC METABOLIC PANEL WITH GFR
Anion gap: 8 (ref 5–15)
BUN: 28 mg/dL — ABNORMAL HIGH (ref 8–23)
CO2: 27 mmol/L (ref 22–32)
Calcium: 8.4 mg/dL — ABNORMAL LOW (ref 8.9–10.3)
Chloride: 107 mmol/L (ref 98–111)
Creatinine, Ser: 0.87 mg/dL (ref 0.61–1.24)
GFR, Estimated: >60 mL/min (ref >60)
Glucose, Bld: 104 mg/dL — ABNORMAL HIGH (ref 70–99)
Potassium: 4.1 mmol/L (ref 3.5–5.1)
Sodium: 142 mmol/L (ref 135–145)

## 2024-06-03 LAB — PROTIME-INR
INR: 1.7 — ABNORMAL HIGH (ref 0.8–1.2)
Prothrombin Time: 20.4 s — ABNORMAL HIGH (ref 11.4–15.2)

## 2024-06-03 LAB — PHOSPHORUS: Phosphorus: 2.8 mg/dL (ref 2.5–4.6)

## 2024-06-03 LAB — APTT: aPTT: 82 s — ABNORMAL HIGH (ref 24–36)

## 2024-06-03 LAB — POTASSIUM: Potassium: 4 mmol/L (ref 3.5–5.1)

## 2024-06-03 MED ORDER — HEPARIN (PORCINE) 25000 UT/250ML-% IV SOLN
1350.0000 [IU]/h | INTRAVENOUS | Status: DC
  Administered 2024-06-03: 1100 [IU]/h via INTRAVENOUS
  Administered 2024-06-04: 1200 [IU]/h via INTRAVENOUS
  Administered 2024-06-05 – 2024-06-07 (×4): 1350 [IU]/h via INTRAVENOUS
  Filled 2024-06-03 (×6): qty 250

## 2024-06-03 MED ORDER — DIATRIZOATE MEGLUMINE & SODIUM 66-10 % PO SOLN
90.0000 mL | Freq: Once | ORAL | Status: AC
  Administered 2024-06-03: 90 mL via NASOGASTRIC
  Filled 2024-06-03: qty 90

## 2024-06-03 MED ORDER — MAGNESIUM SULFATE 2 GM/50ML IV SOLN
2.0000 g | Freq: Once | INTRAVENOUS | Status: AC
  Administered 2024-06-03: 2 g via INTRAVENOUS
  Filled 2024-06-03: qty 50

## 2024-06-03 MED ORDER — HEPARIN BOLUS VIA INFUSION
4000.0000 [IU] | Freq: Once | INTRAVENOUS | Status: AC
  Administered 2024-06-03: 4000 [IU] via INTRAVENOUS
  Filled 2024-06-03: qty 4000

## 2024-06-03 MED ORDER — POTASSIUM CHLORIDE 10 MEQ/100ML IV SOLN
10.0000 meq | INTRAVENOUS | Status: AC
  Administered 2024-06-03 (×3): 10 meq via INTRAVENOUS
  Filled 2024-06-03 (×3): qty 100

## 2024-06-03 NOTE — Progress Notes (Signed)
 PROGRESS NOTE  [name] FMW:[id number] DOB: 12/31/1931 DOA: 06/01/2024 PCP: [name], [name], MD  Brief History:  89-year-old male with a history of coronary disease, hypertension, hyperlipidemia, persistent atrial fibrillation, squamous cell carcinoma of the neck, and CLL presenting with 2-day history of nausea, vomiting, and abdominal pain.  The patient most recently received his infusion of carboplatin  for his cancer on 05/30/2024.  On 05/13/2024, the patient received pegfilgrastim .  He began having abdominal pain with nausea and vomiting.  He endorsed vomiting some coffee grounds emesis but denies any frank bloody emesis.  There is no melena or hematochezia.  After he began vomiting, the patient began complaining of upper abdominal pain and chest pain.  He denies any fevers, chills, headache, coughing, hemoptysis, worsening shortness of breath.  He denies any hematuria but has some dysuria.  He denies any diarrhea.  His last bowel movement was 05/31/2024.  There is no hematochezia or melena. Because of worsening abdominal pain and vomiting he presented for further evaluation and treatment.  He denies any new medications. In the ED, the patient was afebrile and hemodynamically stable.  He was tachycardic in the 120s.  Oxygen  saturation was 95% room air.  WBC 79.9, hemoglobin 13.4, platelets 315.  Sodium 141, potassium 3.9, bicarbonate 27, serum creatinine 1.28.  LFTs were unremarkable.  CT of the abdomen and pelvis showed high-grade SBO with fluid distention of the stomach and small bowel to the distal portion of the small bowel.  There was liver cirrhosis.  There is cholelithiasis without biliary ductal dilatation.  Chest x-ray showed reticular opacities.  The patient was given a liter of normal saline.  He was given pantoprazole  and Zofran .  4 mg IV morphine  was given in the emergency department.  General surgery was consulted to assist management. NG decompression was initiated.  Patient  was initially placed on diltiazem  drip for afib.  This was transitioned to amiodarone  drip in light of his EF 30-35%.   Assessment/Plan: Small bowel obstruction -Case was discussed with general surgery, Dr. Mavis - continue NG decompression - Continue IV fluids - Remain n.p.o. - Judicious opioids - Optimize electrolytes - passing flatus, no BM - SB study per Dr. Mavis   Persistent atrial fibrillation with RVR - initially diltiazem  drip>>amiodarone  drip due to soft BPs and low EF - started amiodarone >>Torsades - d/c amiodarone  - - 06/01/24 echo EF 30-35% - Holding warfarin in the event of surgical intervention - INR 2.1>>2.2>>1.7 - start IV heparin  bridge - use digoxin  to help with rate control vs metoprolol   Torsades de Pointes - developed while on amiodarone  - give additional Mag and K -  Keep mag >2 and K >4 - 06/03/24--no further torsades in last 18 hours   Chronic HFrEF - 04/05/2017 echo EF 40-45% - 06/01/24 echo EF 30-35% - Patient initially clinically hypovolemic.   Leukemoid reaction - secondary to pegfilgrastim  and stress demargination - No blasts noted on differential - Obtain UA--no pyuria - Blood cultures x 2 sets--neg - personally reviewed CXR--no infiltrates   Lactic acidosis - Secondary to volume depletion - Given appropriate fluid boluses - 5.0>>1.5 - Patient is afebrile and hemodynamically stable   Squamous cell carcinoma of the head and neck - Follow-up with medical oncology - Patient last received carboplatin  on 05/30/2024 - Pegfilgrastim  was given 05/31/2024   CLL - Patient follows with medical oncology - Currently does not meet criteria for treatment - Currently under observation  CKD stage IIIa - Baseline creatinine 0.8-1.1   Essential hypertension -temporarily holding metoprolol  -Patient is on Cardizem  CD 120 mg prior to admission -BP soft>>improving   Mixed hyperlipidemia - Holding statin and Zetia                Family  Communication:   daughter at bedside 8/23   Consultants:  general surgery   Code Status:  FULL    DVT Prophylaxis:  warfarin     Procedures: As Listed in Progress Note Above   Antibiotics: None          Subjective: Pt is passing flatus, no BM.  No vomiting.  Feels abd bloating.  Denies f/c, cp, sob, headache  Objective: Vitals:   06/03/24 1400 06/03/24 1500 06/03/24 1600 06/03/24 1611  BP: (!) 119/56 116/69 132/74   Pulse: (!) 128  (!) 123   Resp: (!) 21 14 20    Temp:    98.3 F (36.8 C)  TempSrc:      SpO2: 94% 95% 94%   Weight:      Height:        Intake/Output Summary (Last 24 hours) at 06/03/2024 1716 Last data filed at 06/03/2024 1705 Gross per 24 hour  Intake 376.38 ml  Output 2305 ml  Net -1928.62 ml   Weight change:  Exam:  General:  Pt is alert, follows commands appropriately, not in acute distress HEENT: No icterus, No thrush, No neck mass, Leslie/AT Cardiovascular: IRRR, S1/S2, no rubs, no gallops Respiratory: bibasilar rales Abdomen: Soft/+BS, mild diffuse tender, non distended, no guarding Extremities: No edema, No lymphangitis, No petechiae, No rashes, no synovitis   Data Reviewed: I have personally reviewed following labs and imaging studies Basic Metabolic Panel: Recent Labs  Lab 05/30/24 0808 06/01/24 1000 06/02/24 0252 06/02/24 1237 06/03/24 0500 06/03/24 1153  NA 139 141 140  --  142  --   K 4.0 3.9 4.0 3.7 4.1 4.0  CL 102 98 105  --  107  --   CO2 26 27 26   --  27  --   GLUCOSE 101* 199* 125*  --  104*  --   BUN 14 25* 33*  --  28*  --   CREATININE 0.89 1.28* 1.03  --  0.87  --   CALCIUM  8.9 9.3 8.6*  --  8.4*  --   MG 1.8  --  1.9 1.9 2.1 1.9  PHOS  --   --   --   --  2.8  --    Liver Function Tests: Recent Labs  Lab 05/30/24 0808 06/01/24 1000 06/02/24 0252  AST 22 29 27   ALT 15 17 21   ALKPHOS 82 90 74  BILITOT 0.7 0.9 0.8  PROT 6.2* 6.7 5.1*  ALBUMIN 3.3* 3.4* 2.7*   No results for input(s): LIPASE,  AMYLASE in the last 168 hours. No results for input(s): AMMONIA in the last 168 hours. Coagulation Profile: Recent Labs  Lab 06/01/24 1000 06/02/24 0252 06/03/24 0500  INR 2.1* 2.2* 1.7*   CBC: Recent Labs  Lab 05/30/24 0808 06/01/24 1000 06/02/24 0252 06/03/24 0500  WBC 15.1* 79.9* 68.5* 58.8*  NEUTROABS 9.6* 72.7* 52.7* 43.9*  HGB 12.6* 13.4 10.9* 12.1*  HCT 39.3 40.1 33.8* 35.9*  MCV 104.5* 104.2* 106.0* 105.9*  PLT 292 315 227 201   Cardiac Enzymes: No results for input(s): CKTOTAL, CKMB, CKMBINDEX, TROPONINI in the last 168 hours. BNP: Invalid input(s): POCBNP CBG: No results for input(s): GLUCAP in the last 168  hours. HbA1C: No results for input(s): HGBA1C in the last 72 hours. Urine analysis:    Component Value Date/Time   COLORURINE YELLOW 06/01/2024 1750   APPEARANCEUR CLEAR 06/01/2024 1750   APPEARANCEUR Clear 05/22/2022 0941   LABSPEC >1.046 (H) 06/01/2024 1750   PHURINE 6.0 06/01/2024 1750   GLUCOSEU NEGATIVE 06/01/2024 1750   HGBUR SMALL (A) 06/01/2024 1750   BILIRUBINUR NEGATIVE 06/01/2024 1750   BILIRUBINUR Negative 05/22/2022 0941   KETONESUR NEGATIVE 06/01/2024 1750   PROTEINUR 30 (A) 06/01/2024 1750   UROBILINOGEN negative 02/14/2015 1247   UROBILINOGEN 0.2 01/21/2015 0850   NITRITE NEGATIVE 06/01/2024 1750   LEUKOCYTESUR NEGATIVE 06/01/2024 1750   Sepsis Labs: @LABRCNTIP (procalcitonin:4,lacticidven:4) ) Recent Results (from the past 240 hours)  Urine Culture (for pregnant, neutropenic or urologic patients or patients with an indwelling urinary catheter)     Status: Abnormal   Collection Time: 06/01/24  5:50 PM   Specimen: Urine, Clean Catch  Result Value Ref Range Status   Specimen Description   Final    URINE, CLEAN CATCH Performed at [facility], [address], [place], [place] [address]    Special Requests   Final    Immunocompromised Performed at [facility], [address] [place], [place] [address]     Culture (A)  Final    <10,000 COLONIES/mL INSIGNIFICANT GROWTH Performed at [facility], [address] [place], [place] [address]    Report Status 06/02/2024 FINAL  Final  Culture, blood (Routine X 2) w Reflex to ID Panel     Status: None (Preliminary result)   Collection Time: 06/01/24  6:14 PM   Specimen: BLOOD  Result Value Ref Range Status   Specimen Description BLOOD RIGHT ANTECUBITAL  Final   Special Requests   Final    BOTTLES DRAWN AEROBIC ONLY Blood Culture adequate volume   Culture   Final    NO GROWTH < 12 HOURS Performed at [facility], [address], [place], [place] [address]    Report Status PENDING  Incomplete  Culture, blood (Routine X 2) w Reflex to ID Panel     Status: None (Preliminary result)   Collection Time: 06/01/24  6:14 PM   Specimen: BLOOD  Result Value Ref Range Status   Specimen Description BLOOD RIGHT ANTECUBITAL  Final   Special Requests   Final    BOTTLES DRAWN AEROBIC AND ANAEROBIC Blood Culture results may not be optimal due to an inadequate volume of blood received in culture bottles   Culture   Final    NO GROWTH < 12 HOURS Performed at [facility], [address] [place], [place] [address]    Report Status PENDING  Incomplete  MRSA Next Gen by PCR, Nasal     Status: None   Collection Time: 06/01/24  6:38 PM   Specimen: Nasal Mucosa; Nasal Swab  Result Value Ref Range Status   MRSA by PCR Next Gen NOT DETECTED NOT DETECTED Final    Comment: (NOTE) The GeneXpert MRSA Assay (FDA approved for NASAL specimens only), is one component of a comprehensive MRSA colonization surveillance program. It is not intended to diagnose MRSA infection nor to guide or monitor treatment for MRSA infections. Test performance is not FDA approved in patients less than 48 years old. Performed at [facility], [address], [place],  [address]      Scheduled Meds:  artificial tears  1 drop Both Eyes Q6H   Chlorhexidine  Gluconate Cloth  6  each Topical Q0600  pantoprazole  (PROTONIX ) IV  40 mg Intravenous Q12H   tobramycin   1 drop Both Eyes Q6H   Continuous Infusions:  potassium chloride  100 mL/hr at 06/03/24 1705    Procedures/Studies: DG Abd Portable 1V-Small Bowel Protocol-Position Verification Result Date: 06/03/2024 CLINICAL DATA:  NG tube placement. EXAM: PORTABLE ABDOMEN - 1 VIEW COMPARISON:  Abdominal radiograph and CT 06/01/2024 FINDINGS: Tip of the weighted enteric tube in the stomach, the side port is likely below the diaphragm but not clearly defined on the current exam. Gaseous gastric distension. Gaseous small bowel distention up to 4.2 cm. Small amount of formed stool is in the colon. Right upper quadrant gallstones. IMPRESSION: 1. Tip of the weighted enteric tube in the stomach, the side port is likely below the diaphragm but not clearly defined on the current exam. 2. Gaseous gastric and small bowel distention, suspicious for obstruction. Electronically Signed   By: Andrea Gasman M.D.   On: 06/03/2024 10:23   DG Chest 1 View Result Date: 06/01/2024 CLINICAL DATA:  NG tube placement EXAM: CHEST  1 VIEW COMPARISON:  06/01/2024, 05/02/2024 FINDINGS: Sternotomy and cardiomegaly with strandy opacities at the bases. Enteric tube tip below the diaphragm, side-port in the region of the stomach, tip is non included. Marked air distension of the stomach IMPRESSION: Enteric tube tip below the diaphragm, side-port in the region of the stomach, tip is non included in the field of view. Marked air distension of the stomach. Electronically Signed   By: Luke Bun M.D.   On: 06/01/2024 20:16   DG Abdomen 1 View Result Date: 06/01/2024 CLINICAL DATA:  NG tube placement- had to put new one in due to getting pulled out. EXAM: ABDOMEN - 1 VIEW COMPARISON:  None Available. FINDINGS: The bowel gas pattern is non-obstructive. No evidence of pneumoperitoneum, within the limitations of a supine film. No acute osseous abnormalities. The  soft tissues are within normal limits. Surgical changes, devices, tubes and lines: Interval placement of nasogastric tube with its tip overlying the fundus of the stomach region. However, side hole is just above the left hemidiaphragm in the GE junction region. Recommend further advancement by 2-3 inches to confidently put the side-hole into the stomach. IMPRESSION: *Interval placement of nasogastric tube with its tip overlying the fundus of the stomach region. However, side hole is just above the left hemidiaphragm in the GE junction region. Recommend further advancement by 2-3 inches to confidently put the side-hole into the stomach. Electronically Signed   By: Ree Molt M.D.   On: 06/01/2024 17:14   ECHOCARDIOGRAM COMPLETE Result Date: 06/01/2024    ECHOCARDIOGRAM REPORT   Patient Name:   [name] Date of Exam: 06/01/2024 Medical Rec #:  [id number]    Height:       66.0 in Accession #:    [id number]   Weight:       172.0 lb Date of Birth:  Nov 02, 1931   BSA:          1.876 m Patient Age:    89 years     BP:           126/85 mmHg Patient Gender: M            HR:           107 bpm. Exam Location:  [facility] Procedure: 2D Echo, Cardiac Doppler and Color Doppler (Both Spectral and Color            Flow Doppler were utilized during procedure). Indications:  A-fib  History:        Patient has no prior history of Echocardiogram examinations.                 Arrythmias:Atrial Fibrillation; Risk Factors:Hypertension.  Sonographer:    Vella Key Referring Phys: Clarissa.Climes Amai Cappiello IMPRESSIONS  1. Challenging study in the setting of tachycardia, HR 120-130s.  2. Left ventricular ejection fraction, by estimation, is 30 to 35%. The left ventricle has moderately decreased function. Left ventricular endocardial border not optimally defined to evaluate regional wall motion. Left ventricular diastolic function could not be evaluated.  3. Right ventricular systolic function was not well visualized. The right ventricular  size is moderately enlarged. There is normal pulmonary artery systolic pressure.  4. Left atrial size was severely dilated.  5. Right atrial size was severely dilated.  6. The mitral valve is grossly normal. Mild mitral valve regurgitation. No evidence of mitral stenosis.  7. The aortic valve was not well visualized. Aortic valve regurgitation is not visualized. No aortic stenosis is present.  8. Aortic dilatation noted. There is mild dilatation of the aortic root, measuring 40 mm.  9. The inferior vena cava is dilated in size with >50% respiratory variability, suggesting right atrial pressure of 8 mmHg. Comparison(s): No prior Echocardiogram. FINDINGS  Left Ventricle: Left ventricular ejection fraction, by estimation, is 30 to 35%. The left ventricle has moderately decreased function. Left ventricular endocardial border not optimally defined to evaluate regional wall motion. Strain was performed and the global longitudinal strain is indeterminate. The left ventricular internal cavity size was normal in size. There is no left ventricular hypertrophy. Left ventricular diastolic function could not be evaluated due to atrial fibrillation. Left ventricular diastolic function could not be evaluated. Right Ventricle: The right ventricular size is moderately enlarged. No increase in right ventricular wall thickness. Right ventricular systolic function was not well visualized. There is normal pulmonary artery systolic pressure. The tricuspid regurgitant velocity is 2.60 m/s, and with an assumed right atrial pressure of 8 mmHg, the estimated right ventricular systolic pressure is 35.0 mmHg. Left Atrium: Left atrial size was severely dilated. Right Atrium: Right atrial size was severely dilated. Pericardium: There is no evidence of pericardial effusion. Mitral Valve: The mitral valve is grossly normal. Mild mitral valve regurgitation. No evidence of mitral valve stenosis. Tricuspid Valve: The tricuspid valve is normal in  structure. Tricuspid valve regurgitation is mild . No evidence of tricuspid stenosis. Aortic Valve: The aortic valve was not well visualized. Aortic valve regurgitation is not visualized. No aortic stenosis is present. Pulmonic Valve: The pulmonic valve was not well visualized. Pulmonic valve regurgitation is not visualized. No evidence of pulmonic stenosis. Aorta: Aortic dilatation noted. There is mild dilatation of the aortic root, measuring 40 mm. Venous: The inferior vena cava is dilated in size with greater than 50% respiratory variability, suggesting right atrial pressure of 8 mmHg. IAS/Shunts: No atrial level shunt detected by color flow Doppler. Additional Comments: 3D was performed not requiring image post processing on an independent workstation and was indeterminate.  LEFT VENTRICLE PLAX 2D LVIDd:         4.80 cm      Diastology LVIDs:         4.20 cm      LV e' medial:    4.35 cm/s LV PW:         1.20 cm      LV E/e' medial:  26.9 LV IVS:  1.10 cm      LV e' lateral:   3.92 cm/s LVOT diam:     1.70 cm      LV E/e' lateral: 29.8 LV SV:         25 LV SV Index:   13 LVOT Area:     2.27 cm  LV Volumes (MOD) LV vol d, MOD A2C: 111.0 ml LV vol d, MOD A4C: 96.9 ml LV vol s, MOD A2C: 58.0 ml LV vol s, MOD A4C: 51.7 ml LV SV MOD A2C:     53.0 ml LV SV MOD A4C:     96.9 ml LV SV MOD BP:      51.5 ml RIGHT VENTRICLE RV Basal diam:  4.60 cm RV S prime:     8.49 cm/s TAPSE (M-mode): 1.2 cm LEFT ATRIUM              Index        RIGHT ATRIUM           Index LA diam:        6.40 cm  3.41 cm/m   RA Area:     26.50 cm LA Vol (A2C):   111.0 ml 59.18 ml/m  RA Volume:   87.50 ml  46.65 ml/m LA Vol (A4C):   108.0 ml 57.58 ml/m LA Biplane Vol: 120.0 ml 63.98 ml/m  AORTIC VALVE LVOT Vmax:   64.60 cm/s LVOT Vmean:  48.900 cm/s LVOT VTI:    0.110 m  AORTA Ao Root diam: 4.00 cm MITRAL VALVE                TRICUSPID VALVE MV Area (PHT): 12.64 cm    TV Peak grad:   27.0 mmHg MV Decel Time: 60 msec      TV Vmax:         2.60 m/s MV E velocity: 117.00 cm/s  TR Peak grad:   27.0 mmHg MV A velocity: 50.70 cm/s   TR Vmax:        260.00 cm/s MV E/A ratio:  2.31                             SHUNTS                             Systemic VTI:  0.11 m                             Systemic Diam: 1.70 cm Vishnu Priya Mallipeddi Electronically signed by Diannah Late Mallipeddi Signature Date/Time: 06/01/2024/4:13:53 PM    Final    DG Abdomen 1 View Result Date: 06/01/2024 CLINICAL DATA:  NG tube placement EXAM: ABDOMEN - 1 VIEW COMPARISON:  06/01/2024 FINDINGS: Weighted feeding tube terminates in the distended stomach. Redemonstrated findings of small-bowel obstruction. No pneumoperitoneum. No organomegaly or radiopaque calculi. Osteopenia. Bilateral hip arthroplasties. Cardiomegaly. Sternotomy wires and CABG markers. IMPRESSION: Weighted feeding tube terminates in the distended stomach. Electronically Signed   By: Rogelia Myers M.D.   On: 06/01/2024 13:54   CT ABDOMEN PELVIS W CONTRAST Result Date: 06/01/2024 CLINICAL DATA:  Abdominal pain, acute nonlocalized.  Vomiting blood. EXAM: CT ABDOMEN AND PELVIS WITH CONTRAST TECHNIQUE: Multidetector CT imaging of the abdomen and pelvis was performed using the standard protocol following bolus administration of intravenous contrast. RADIATION DOSE REDUCTION: This exam was performed according  to the departmental dose-optimization program which includes automated exposure control, adjustment of the mA and/or kV according to patient size and/or use of iterative reconstruction technique. CONTRAST:  OMNIPAQUE  IOHEXOL  300 MG/ML  SOLN COMPARISON:  CT chest 05/02/2024 FINDINGS: Lower chest: Chronic scarring at the lung bases. Known adenopathy in the right hilum. See results of previous chest CT. Hepatobiliary: Cirrhosis of the liver with enlarged left lobe and nodular surface of the liver. No sign of liver mass. Multiple calcified gallstones without CT evidence of cholecystitis or obstruction.  Pancreas: Normal Spleen: Normal Adrenals/Urinary Tract: Adrenal glands are normal. No renal obstruction or worrisome lesion. Simple appearing cysts. Bladder poorly seen because of artifact from hip replacements. Stomach/Bowel: Fluid distension of the stomach and small intestine to the midportion of the small intestine, with relative collapse of the small intestine distal to that. Findings consistent with high-grade small bowel obstruction. Specific etiology not specifically elucidated. Ordinary diverticulosis of the left colon without evidence of diverticulitis. Vascular/Lymphatic: Aortic atherosclerosis. No aneurysm. IVC is normal. No adenopathy. Reproductive: Normal other than enlarged prostate. Other: No free fluid or air. Musculoskeletal: subacute compression fracture of T12 with loss of height of 50%. No gross retropulsion. This was present in May. IMPRESSION: 1. High-grade small bowel obstruction with fluid distension of the stomach and small intestine to the midportion of the small intestine, with relative collapse of the small intestine distal to that. Specific etiology not elucidated. 2. Cirrhosis of the liver. 3. Cholelithiasis without CT evidence of cholecystitis or obstruction. 4. Aortic atherosclerosis. 5. Subacute compression fracture of T12 with loss of height of 50%. No gross retropulsion. This was present in May. 6. Known adenopathy in the right hilum. See results of previous chest CT. Aortic Atherosclerosis (ICD10-I70.0). Electronically Signed   By: Oneil Officer M.D.   On: 06/01/2024 11:33   DG Chest Port 1 View Result Date: 06/01/2024 EXAM: 1 VIEW XRAY OF THE CHEST 06/01/2024 10:10:00 AM COMPARISON: AP radiograph of the chest dated 06/09/2022. CLINICAL HISTORY: [id number] GI bleed [id number]. Pt bib ems from home with reports of vomiting blood. Family reports it looked like coffee ground emesis. Onset yesterday afternoon. Abdomen hard and distended which is new per family. Last chemo 2 days ago.  Complaining of chest pain, reports that is ; where his cancer is. Afib on monitor. Given 4mg  zofran  en route. ; 120/60; HR 100-130; 93% RA. Hx of afib, [name], hypertension, CABG. Former smoker FINDINGS: LUNGS AND PLEURA: Coarse reticular opacities present within the mid and lower lung zones, which appear chronic. No focal pulmonary opacity. No pulmonary edema. No pleural effusion. No pneumothorax. HEART AND MEDIASTINUM: The heart is enlarged. The patient is status post CABG. No acute abnormality of the cardiac and mediastinal silhouettes. BONES AND SOFT TISSUES: No acute osseous abnormality. A left subclavian chest border is present as before. IMPRESSION: 1. No acute findings. 2. Enlarged heart and status post CABG. 3. Chronic coarse reticular opacities in the mid and lower lung zones. Electronically signed by: Evalene Coho MD 06/01/2024 10:30 AM EDT RP Workstation: DARYLENE Alm Schneider, DO  [facility]  If 7PM-7AM, please contact night-coverage [email or web address] Password [facility] 06/03/2024, 5:16 PM   LOS: 2 days

## 2024-06-03 NOTE — Plan of Care (Signed)
  Problem: Education: Goal: Knowledge of General Education information will improve Description: Including pain rating scale, medication(s)/side effects and non-pharmacologic comfort measures Outcome: Progressing   Problem: Clinical Measurements: Goal: Respiratory complications will improve Outcome: Progressing   Problem: Nutrition: Goal: Adequate nutrition will be maintained Outcome: Not Progressing   

## 2024-06-03 NOTE — Progress Notes (Signed)
 Subjective: Patient denies any abdominal pain.  States he has passed flatus.  No bowel movement yet.  Patient states he does have a history of difficulty moving his bowels.  Objective: Vital signs in last 24 hours: Temp:  [98.2 F (36.8 C)-99 F (37.2 C)] 98.4 F (36.9 C) (08/23 0754) Pulse Rate:  [105-142] 105 (08/23 0600) Resp:  [18-33] 18 (08/23 0600) BP: (105-160)/(62-104) 120/64 (08/23 0700) SpO2:  [90 %-99 %] 94 % (08/23 0600) Last BM Date : 05/30/24  Intake/Output from previous day: 08/22 0701 - 08/23 0700 In: 1264.1 [I.V.:1204.9; NG/GT:50; IV Piggyback:9.2] Out: 1455 [Urine:725; Emesis/NG output:730] Intake/Output this shift: No intake/output data recorded.  General appearance: alert, cooperative, and no distress GI: Soft, not particularly distended.  Occasional bowel sounds appreciated.  No tenderness noted.  Lab Results:  Recent Labs    06/02/24 0252 06/03/24 0500  WBC 68.5* 58.8*  HGB 10.9* 12.1*  HCT 33.8* 35.9*  PLT 227 201   BMET Recent Labs    06/02/24 0252 06/02/24 1237 06/03/24 0500  NA 140  --  142  K 4.0 3.7 4.1  CL 105  --  107  CO2 26  --  27  GLUCOSE 125*  --  104*  BUN 33*  --  28*  CREATININE 1.03  --  0.87  CALCIUM  8.6*  --  8.4*   PT/INR Recent Labs    06/02/24 0252 06/03/24 0500  LABPROT 25.5* 20.4*  INR 2.2* 1.7*    Studies/Results: DG Chest 1 View Result Date: 06/01/2024 CLINICAL DATA:  NG tube placement EXAM: CHEST  1 VIEW COMPARISON:  06/01/2024, 05/02/2024 FINDINGS: Sternotomy and cardiomegaly with strandy opacities at the bases. Enteric tube tip below the diaphragm, side-port in the region of the stomach, tip is non included. Marked air distension of the stomach IMPRESSION: Enteric tube tip below the diaphragm, side-port in the region of the stomach, tip is non included in the field of view. Marked air distension of the stomach. Electronically Signed   By: Luke Bun M.D.   On: 06/01/2024 20:16   DG Abdomen 1  View Result Date: 06/01/2024 CLINICAL DATA:  NG tube placement- had to put new one in due to getting pulled out. EXAM: ABDOMEN - 1 VIEW COMPARISON:  None Available. FINDINGS: The bowel gas pattern is non-obstructive. No evidence of pneumoperitoneum, within the limitations of a supine film. No acute osseous abnormalities. The soft tissues are within normal limits. Surgical changes, devices, tubes and lines: Interval placement of nasogastric tube with its tip overlying the fundus of the stomach region. However, side hole is just above the left hemidiaphragm in the GE junction region. Recommend further advancement by 2-3 inches to confidently put the side-hole into the stomach. IMPRESSION: *Interval placement of nasogastric tube with its tip overlying the fundus of the stomach region. However, side hole is just above the left hemidiaphragm in the GE junction region. Recommend further advancement by 2-3 inches to confidently put the side-hole into the stomach. Electronically Signed   By: Ree Molt M.D.   On: 06/01/2024 17:14   ECHOCARDIOGRAM COMPLETE Result Date: 06/01/2024    ECHOCARDIOGRAM REPORT   Patient Name:   [name] Date of Exam: 06/01/2024 Medical Rec #:  [id number]    Height:       66.0 in Accession #:    [id number]   Weight:       172.0 lb Date of Birth:  1932-05-18   BSA:  1.876 m Patient Age:    89 years     BP:           126/85 mmHg Patient Gender: M            HR:           107 bpm. Exam Location:  [facility] Procedure: 2D Echo, Cardiac Doppler and Color Doppler (Both Spectral and Color            Flow Doppler were utilized during procedure). Indications:    A-fib  History:        Patient has no prior history of Echocardiogram examinations.                 Arrythmias:Atrial Fibrillation; Risk Factors:Hypertension.  Sonographer:    Vella Key Referring Phys: Clarissa.Climes DAVID TAT IMPRESSIONS  1. Challenging study in the setting of tachycardia, HR 120-130s.  2. Left ventricular ejection  fraction, by estimation, is 30 to 35%. The left ventricle has moderately decreased function. Left ventricular endocardial border not optimally defined to evaluate regional wall motion. Left ventricular diastolic function could not be evaluated.  3. Right ventricular systolic function was not well visualized. The right ventricular size is moderately enlarged. There is normal pulmonary artery systolic pressure.  4. Left atrial size was severely dilated.  5. Right atrial size was severely dilated.  6. The mitral valve is grossly normal. Mild mitral valve regurgitation. No evidence of mitral stenosis.  7. The aortic valve was not well visualized. Aortic valve regurgitation is not visualized. No aortic stenosis is present.  8. Aortic dilatation noted. There is mild dilatation of the aortic root, measuring 40 mm.  9. The inferior vena cava is dilated in size with >50% respiratory variability, suggesting right atrial pressure of 8 mmHg. Comparison(s): No prior Echocardiogram. FINDINGS  Left Ventricle: Left ventricular ejection fraction, by estimation, is 30 to 35%. The left ventricle has moderately decreased function. Left ventricular endocardial border not optimally defined to evaluate regional wall motion. Strain was performed and the global longitudinal strain is indeterminate. The left ventricular internal cavity size was normal in size. There is no left ventricular hypertrophy. Left ventricular diastolic function could not be evaluated due to atrial fibrillation. Left ventricular diastolic function could not be evaluated. Right Ventricle: The right ventricular size is moderately enlarged. No increase in right ventricular wall thickness. Right ventricular systolic function was not well visualized. There is normal pulmonary artery systolic pressure. The tricuspid regurgitant velocity is 2.60 m/s, and with an assumed right atrial pressure of 8 mmHg, the estimated right ventricular systolic pressure is 35.0 mmHg. Left  Atrium: Left atrial size was severely dilated. Right Atrium: Right atrial size was severely dilated. Pericardium: There is no evidence of pericardial effusion. Mitral Valve: The mitral valve is grossly normal. Mild mitral valve regurgitation. No evidence of mitral valve stenosis. Tricuspid Valve: The tricuspid valve is normal in structure. Tricuspid valve regurgitation is mild . No evidence of tricuspid stenosis. Aortic Valve: The aortic valve was not well visualized. Aortic valve regurgitation is not visualized. No aortic stenosis is present. Pulmonic Valve: The pulmonic valve was not well visualized. Pulmonic valve regurgitation is not visualized. No evidence of pulmonic stenosis. Aorta: Aortic dilatation noted. There is mild dilatation of the aortic root, measuring 40 mm. Venous: The inferior vena cava is dilated in size with greater than 50% respiratory variability, suggesting right atrial pressure of 8 mmHg. IAS/Shunts: No atrial level shunt detected by color flow Doppler. Additional Comments: 3D  was performed not requiring image post processing on an independent workstation and was indeterminate.  LEFT VENTRICLE PLAX 2D LVIDd:         4.80 cm      Diastology LVIDs:         4.20 cm      LV e' medial:    4.35 cm/s LV PW:         1.20 cm      LV E/e' medial:  26.9 LV IVS:        1.10 cm      LV e' lateral:   3.92 cm/s LVOT diam:     1.70 cm      LV E/e' lateral: 29.8 LV SV:         25 LV SV Index:   13 LVOT Area:     2.27 cm  LV Volumes (MOD) LV vol d, MOD A2C: 111.0 ml LV vol d, MOD A4C: 96.9 ml LV vol s, MOD A2C: 58.0 ml LV vol s, MOD A4C: 51.7 ml LV SV MOD A2C:     53.0 ml LV SV MOD A4C:     96.9 ml LV SV MOD BP:      51.5 ml RIGHT VENTRICLE RV Basal diam:  4.60 cm RV S prime:     8.49 cm/s TAPSE (M-mode): 1.2 cm LEFT ATRIUM              Index        RIGHT ATRIUM           Index LA diam:        6.40 cm  3.41 cm/m   RA Area:     26.50 cm LA Vol (A2C):   111.0 ml 59.18 ml/m  RA Volume:   87.50 ml  46.65  ml/m LA Vol (A4C):   108.0 ml 57.58 ml/m LA Biplane Vol: 120.0 ml 63.98 ml/m  AORTIC VALVE LVOT Vmax:   64.60 cm/s LVOT Vmean:  48.900 cm/s LVOT VTI:    0.110 m  AORTA Ao Root diam: 4.00 cm MITRAL VALVE                TRICUSPID VALVE MV Area (PHT): 12.64 cm    TV Peak grad:   27.0 mmHg MV Decel Time: 60 msec      TV Vmax:        2.60 m/s MV E velocity: 117.00 cm/s  TR Peak grad:   27.0 mmHg MV A velocity: 50.70 cm/s   TR Vmax:        260.00 cm/s MV E/A ratio:  2.31                             SHUNTS                             Systemic VTI:  0.11 m                             Systemic Diam: 1.70 cm Vishnu Priya Mallipeddi Electronically signed by Diannah Late Mallipeddi Signature Date/Time: 06/01/2024/4:13:53 PM    Final    DG Abdomen 1 View Result Date: 06/01/2024 CLINICAL DATA:  NG tube placement EXAM: ABDOMEN - 1 VIEW COMPARISON:  06/01/2024 FINDINGS: Weighted feeding tube terminates in the distended stomach. Redemonstrated findings of small-bowel obstruction. No pneumoperitoneum. No organomegaly or  radiopaque calculi. Osteopenia. Bilateral hip arthroplasties. Cardiomegaly. Sternotomy wires and CABG markers. IMPRESSION: Weighted feeding tube terminates in the distended stomach. Electronically Signed   By: Rogelia Myers M.D.   On: 06/01/2024 13:54   CT ABDOMEN PELVIS W CONTRAST Result Date: 06/01/2024 CLINICAL DATA:  Abdominal pain, acute nonlocalized.  Vomiting blood. EXAM: CT ABDOMEN AND PELVIS WITH CONTRAST TECHNIQUE: Multidetector CT imaging of the abdomen and pelvis was performed using the standard protocol following bolus administration of intravenous contrast. RADIATION DOSE REDUCTION: This exam was performed according to the departmental dose-optimization program which includes automated exposure control, adjustment of the mA and/or kV according to patient size and/or use of iterative reconstruction technique. CONTRAST:  OMNIPAQUE  IOHEXOL  300 MG/ML  SOLN COMPARISON:  CT chest 05/02/2024  FINDINGS: Lower chest: Chronic scarring at the lung bases. Known adenopathy in the right hilum. See results of previous chest CT. Hepatobiliary: Cirrhosis of the liver with enlarged left lobe and nodular surface of the liver. No sign of liver mass. Multiple calcified gallstones without CT evidence of cholecystitis or obstruction. Pancreas: Normal Spleen: Normal Adrenals/Urinary Tract: Adrenal glands are normal. No renal obstruction or worrisome lesion. Simple appearing cysts. Bladder poorly seen because of artifact from hip replacements. Stomach/Bowel: Fluid distension of the stomach and small intestine to the midportion of the small intestine, with relative collapse of the small intestine distal to that. Findings consistent with high-grade small bowel obstruction. Specific etiology not specifically elucidated. Ordinary diverticulosis of the left colon without evidence of diverticulitis. Vascular/Lymphatic: Aortic atherosclerosis. No aneurysm. IVC is normal. No adenopathy. Reproductive: Normal other than enlarged prostate. Other: No free fluid or air. Musculoskeletal: subacute compression fracture of T12 with loss of height of 50%. No gross retropulsion. This was present in May. IMPRESSION: 1. High-grade small bowel obstruction with fluid distension of the stomach and small intestine to the midportion of the small intestine, with relative collapse of the small intestine distal to that. Specific etiology not elucidated. 2. Cirrhosis of the liver. 3. Cholelithiasis without CT evidence of cholecystitis or obstruction. 4. Aortic atherosclerosis. 5. Subacute compression fracture of T12 with loss of height of 50%. No gross retropulsion. This was present in May. 6. Known adenopathy in the right hilum. See results of previous chest CT. Aortic Atherosclerosis (ICD10-I70.0). Electronically Signed   By: Oneil Officer M.D.   On: 06/01/2024 11:33   DG Chest Port 1 View Result Date: 06/01/2024 EXAM: 1 VIEW XRAY OF THE CHEST  06/01/2024 10:10:00 AM COMPARISON: AP radiograph of the chest dated 06/09/2022. CLINICAL HISTORY: [id number] GI bleed [id number]. Pt bib ems from home with reports of vomiting blood. Family reports it looked like coffee ground emesis. Onset yesterday afternoon. Abdomen hard and distended which is new per family. Last chemo 2 days ago. Complaining of chest pain, reports that is ; where his cancer is. Afib on monitor. Given 4mg  zofran  en route. ; 120/60; HR 100-130; 93% RA. Hx of afib, [name], hypertension, CABG. Former smoker FINDINGS: LUNGS AND PLEURA: Coarse reticular opacities present within the mid and lower lung zones, which appear chronic. No focal pulmonary opacity. No pulmonary edema. No pleural effusion. No pneumothorax. HEART AND MEDIASTINUM: The heart is enlarged. The patient is status post CABG. No acute abnormality of the cardiac and mediastinal silhouettes. BONES AND SOFT TISSUES: No acute osseous abnormality. A left subclavian chest border is present as before. IMPRESSION: 1. No acute findings. 2. Enlarged heart and status post CABG. 3. Chronic coarse reticular opacities in the mid and lower lung  zones. Electronically signed by: Evalene Coho MD 06/01/2024 10:30 AM EDT RP Workstation: [id number]    Anti-infectives: Anti-infectives (From admission, onward)    None       Assessment/Plan: Impression: Small bowel obstruction in the face of uncontrolled atrial fibrillation and multiple other comorbidities. Plan: Will get small bowel obstruction protocol study today.  Further management is pending those results.  No need for acute surgical intervention at the present time.  LOS: 2 days    Oneil Budge 06/03/2024

## 2024-06-03 NOTE — Consult Note (Signed)
 PHARMACY - ANTICOAGULATION CONSULT NOTE  Pharmacy Consult for heparin  infusion Indication: atrial fibrillation  Allergies  Allergen Reactions   Penicillins Hives and Rash    Has patient had a PCN reaction causing immediate rash, facial/tongue/throat swelling, SOB or lightheadedness with hypotension: Yes Has patient had a PCN reaction causing severe rash involving mucus membranes or skin necrosis: Yes Has patient had a PCN reaction that required hospitalization: No Has patient had a PCN reaction occurring within the last 10 years: No If all of the above answers are NO, then may proceed with Cephalosporin use.    Cetuximab  Cough    Cough and scratchy throat. Drug rechallenged and pt able to tolerated the rest of the infusion without any complications.    Hct [Hydrochlorothiazide] Other (See Comments)    hyper   Statins Other (See Comments)    Myalgia, tolerates low dosages of lovastatin      Patient Measurements: Height: 5' 6 (167.6 cm) Weight: 80.1 kg (176 lb 9.4 oz) IBW/kg (Calculated) : 63.8 HEPARIN  DW (KG): 79.9  Vital Signs: Temp: 98.3 F (36.8 C) (08/23 1611) Temp Source: Oral (08/23 1116) BP: 132/74 (08/23 1600) Pulse Rate: 123 (08/23 1600)  Labs: Recent Labs    06/01/24 1000 06/01/24 1206 06/02/24 0252 06/03/24 0500  HGB 13.4  --  10.9* 12.1*  HCT 40.1  --  33.8* 35.9*  PLT 315  --  227 201  LABPROT 24.9*  --  25.5* 20.4*  INR 2.1*  --  2.2* 1.7*  CREATININE 1.28*  --  1.03 0.87  TROPONINIHS 9 10  --   --     Estimated Creatinine Clearance: 57.2 mL/min (by C-G formula based on SCr of 0.87 mg/dL).   Medical History: Past Medical History:  Diagnosis Date   Anemia    Atrial fibrillation (HCC)    Cataract    Cellulitis    In past   Complication of anesthesia    HARD TIME WAKING; CAUSES MY BP TO GO UP    Coronary artery disease    5 bypasses   DJD (degenerative joint disease)    Ejection fraction < 50%    Mildly reduced, 40% by echo   GERD  (gastroesophageal reflux disease)    Hyperlipidemia    Hypertension    Persistent atrial fibrillation (HCC)    Prostate hypertrophy    on CT scan 09/2014   PUD (peptic ulcer disease)    RESOLVED    Skin cancer of eyelid    Resected    Medications:  Patient on Warfarin 5 mg Sun/Wed and 2.5 mg AOD, currently on hold for possible procedure  Assessment: 89 yo male presented to ED on 8/21 with complaint of abdominal pain, nausea, and vomiting.  Patient admitted for treatment of small bowel obstruction.  Patient anticoagulated with Warfarin for Afib, this is currently being held.  Pharmacy consulted to initiate heparin  infusion.  INR = 1.7, baseline aPTT ordered  Goal of Therapy:  Heparin  level 0.3-0.7 units/ml Monitor platelets by anticoagulation protocol: Yes   Plan:  Give 4000 units bolus x 1 Start heparin  infusion at 1100 units/hr Check anti-Xa level in 8 hours and daily while on heparin  Continue to monitor H&H and platelets  Kayla JULIANNA Blew, PharmD, BCPS 06/03/2024,5:33 PM

## 2024-06-04 ENCOUNTER — Inpatient Hospital Stay (HOSPITAL_COMMUNITY)

## 2024-06-04 DIAGNOSIS — I4721 Torsades de pointes: Secondary | ICD-10-CM | POA: Diagnosis not present

## 2024-06-04 DIAGNOSIS — K56609 Unspecified intestinal obstruction, unspecified as to partial versus complete obstruction: Secondary | ICD-10-CM | POA: Diagnosis not present

## 2024-06-04 DIAGNOSIS — I4821 Permanent atrial fibrillation: Secondary | ICD-10-CM | POA: Diagnosis not present

## 2024-06-04 LAB — CBC
HCT: 36.8 % — ABNORMAL LOW (ref 39.0–52.0)
Hemoglobin: 12.2 g/dL — ABNORMAL LOW (ref 13.0–17.0)
MCH: 34.8 pg — ABNORMAL HIGH (ref 26.0–34.0)
MCHC: 33.2 g/dL (ref 30.0–36.0)
MCV: 104.8 fL — ABNORMAL HIGH (ref 80.0–100.0)
Platelets: 197 K/uL (ref 150–400)
RBC: 3.51 MIL/uL — ABNORMAL LOW (ref 4.22–5.81)
RDW: 14.5 % (ref 11.5–15.5)
WBC: 43.2 K/uL — ABNORMAL HIGH (ref 4.0–10.5)
nRBC: 0 % (ref 0.0–0.2)

## 2024-06-04 LAB — BASIC METABOLIC PANEL WITH GFR
Anion gap: 14 (ref 5–15)
BUN: 36 mg/dL — ABNORMAL HIGH (ref 8–23)
CO2: 24 mmol/L (ref 22–32)
Calcium: 8.6 mg/dL — ABNORMAL LOW (ref 8.9–10.3)
Chloride: 102 mmol/L (ref 98–111)
Creatinine, Ser: 0.93 mg/dL (ref 0.61–1.24)
GFR, Estimated: >60 mL/min (ref >60)
Glucose, Bld: 118 mg/dL — ABNORMAL HIGH (ref 70–99)
Potassium: 4.1 mmol/L (ref 3.5–5.1)
Sodium: 140 mmol/L (ref 135–145)

## 2024-06-04 LAB — PROTIME-INR
INR: 1.6 — ABNORMAL HIGH (ref 0.8–1.2)
Prothrombin Time: 19.8 s — ABNORMAL HIGH (ref 11.4–15.2)

## 2024-06-04 LAB — MAGNESIUM: Magnesium: 2.3 mg/dL (ref 1.7–2.4)

## 2024-06-04 LAB — HEPARIN LEVEL (UNFRACTIONATED)
Heparin Unfractionated: >1.1 [IU]/mL — ABNORMAL HIGH (ref 0.30–0.70)
Heparin Unfractionated: 0.24 [IU]/mL — ABNORMAL LOW (ref 0.30–0.70)
Heparin Unfractionated: 0.22 [IU]/mL — ABNORMAL LOW (ref 0.30–0.70)

## 2024-06-04 MED ORDER — LACTATED RINGERS IV BOLUS
500.0000 mL | Freq: Once | INTRAVENOUS | Status: AC
  Administered 2024-06-04: 500 mL via INTRAVENOUS

## 2024-06-04 MED ORDER — DILTIAZEM HCL 25 MG/5ML IV SOLN
5.0000 mg | Freq: Once | INTRAVENOUS | Status: AC
  Administered 2024-06-04: 5 mg via INTRAVENOUS
  Filled 2024-06-04: qty 5

## 2024-06-04 MED ORDER — METOPROLOL TARTRATE 5 MG/5ML IV SOLN
2.5000 mg | Freq: Four times a day (QID) | INTRAVENOUS | Status: DC
  Administered 2024-06-04 – 2024-06-05 (×4): 2.5 mg via INTRAVENOUS
  Filled 2024-06-04 (×5): qty 5

## 2024-06-04 MED ORDER — LACTATED RINGERS IV SOLN: INTRAVENOUS | Status: AC

## 2024-06-04 NOTE — Plan of Care (Signed)

## 2024-06-04 NOTE — Plan of Care (Signed)
  Problem: Health Behavior/Discharge Planning: Goal: Ability to manage health-related needs will improve Outcome: Progressing   Problem: Clinical Measurements: Goal: Respiratory complications will improve Outcome: Progressing Goal: Cardiovascular complication will be avoided Outcome: Not Progressing

## 2024-06-04 NOTE — Progress Notes (Addendum)
 PROGRESS NOTE  [name] FMW:[id number] DOB: August 15, 1932 DOA: 06/01/2024 PCP: [name], [name], MD  Brief History:  89 year old male with a history of coronary disease, hypertension, hyperlipidemia, persistent atrial fibrillation, squamous cell carcinoma of the neck, and CLL presenting with 2-day history of nausea, vomiting, and abdominal pain.  The patient most recently received his infusion of carboplatin  for his cancer on 05/30/2024.  On 05/13/2024, the patient received pegfilgrastim .  He began having abdominal pain with nausea and vomiting.  He endorsed vomiting some coffee grounds emesis but denies any frank bloody emesis.  There is no melena or hematochezia.  After he began vomiting, the patient began complaining of upper abdominal pain and chest pain.  He denies any fevers, chills, headache, coughing, hemoptysis, worsening shortness of breath.  He denies any hematuria but has some dysuria.  He denies any diarrhea.  His last bowel movement was 05/31/2024.  There is no hematochezia or melena. Because of worsening abdominal pain and vomiting he presented for further evaluation and treatment.  He denies any new medications. In the ED, the patient was afebrile and hemodynamically stable.  He was tachycardic in the 120s.  Oxygen  saturation was 95% room air.  WBC 79.9, hemoglobin 13.4, platelets 315.  Sodium 141, potassium 3.9, bicarbonate 27, serum creatinine 1.28.  LFTs were unremarkable.  CT of the abdomen and pelvis showed high-grade SBO with fluid distention of the stomach and small bowel to the distal portion of the small bowel.  There was liver cirrhosis.  There is cholelithiasis without biliary ductal dilatation.  Chest x-ray showed reticular opacities.  The patient was given a liter of normal saline.  He was given pantoprazole  and Zofran .  4 mg IV morphine  was given in the emergency department.  General surgery was consulted to assist management. NG decompression was initiated.  Patient  was initially placed on diltiazem  drip for afib.  This was transitioned to amiodarone  drip in light of his EF 30-35%.   Assessment/Plan: Small bowel obstruction -Case was discussed with general surgery, Dr. Mavis - continue NG decompression>>approx 2L out since admission - Continue IV fluids - Remain n.p.o. - Judicious opioids - Optimize electrolytes - passing flatus, no BM - 8/23 SB study--SBO persists - due to comorbidities and low EF, anesthesia at [facility] feels pt needs transferred - 06/04/24--discussed with general surgery, Dr. Dara spoke with [facility], Dr. Rosanne who will consult after pt is transferred   Persistent atrial fibrillation with RVR - initially diltiazem  drip>>amiodarone  drip due to soft BPs and low EF - started amiodarone >>Torsades - d/c amiodarone >>no more torsades  -  06/01/24 echo EF 30-35% - Holding warfarin in the event of surgical intervention - INR 2.1>>2.2>>1.7 - started IV heparin  bridge 06/03/24 - use digoxin  to help with rate control vs metoprolol    Torsades de Pointes - developed while on amiodarone  - give additional Mag and K -  Keep mag >2 and K >4 - 06/03/24--no further torsades in last 36 hours   Chronic HFrEF - 04/05/2017 echo EF 40-45% - 06/01/24 echo EF 30-35% - Patient initially clinically hypovolemic.   Leukemoid reaction - secondary to pegfilgrastim  and stress demargination - No blasts noted on differential - Obtain UA--no pyuria - Blood cultures x 2 sets--neg - personally reviewed CXR--no infiltrates - WBC trending down   Lactic acidosis - Secondary to volume depletion - Given appropriate fluid boluses - 5.0>>1.5 - Patient is afebrile and hemodynamically stable   Squamous cell carcinoma  of the head and neck - Follow-up with medical oncology-Dr. Davonna - Patient last received carboplatin  on 05/30/2024 - Pegfilgrastim  was given 05/31/2024   CLL - Patient follows with medical oncology - Currently does not meet criteria for  treatment - Currently under observation   CKD stage IIIa - Baseline creatinine 0.8-1.1   Essential hypertension -temporarily holding metoprolol  -Patient is on Cardizem  CD 120 mg prior to admission -BP soft>>improving   Mixed hyperlipidemia - Holding statin and Zetia                Family Communication:   daughter at bedside 8/24   Consultants:  general surgery   Code Status:  FULL    DVT Prophylaxis:  warfarin     Procedures: As Listed in Progress Note Above   Antibiotics: None       Subjective: Pt complains of intermittent abd pain.  Passing flatus, but no BM.  Denies n/v, cp, sob, f/c  Objective: Vitals:   06/04/24 0434 06/04/24 0700 06/04/24 0729 06/04/24 0800  BP:  121/86  (!) 145/84  Pulse:  68  (!) 104  Resp:  20  19  Temp: 98.3 F (36.8 C)  97.6 F (36.4 C)   TempSrc: Axillary  Oral   SpO2:  94%  94%  Weight:      Height:        Intake/Output Summary (Last 24 hours) at 06/04/2024 0930 Last data filed at 06/04/2024 0845 Gross per 24 hour  Intake 586.76 ml  Output 2150 ml  Net -1563.24 ml   Weight change:  Exam:  General:  Pt is alert, follows commands appropriately, not in acute distress HEENT: No icterus, No thrush, No neck mass, [place]/AT Cardiovascular: IRRR, S1/S2, no rubs, no gallops Respiratory: bibasilar rales. No wheeze Abdomen: Soft/+BS, mild diffuse tender, non distended, no guarding Extremities: No edema, No lymphangitis, No petechiae, No rashes, no synovitis   Data Reviewed: I have personally reviewed following labs and imaging studies Basic Metabolic Panel: Recent Labs  Lab 05/30/24 0808 06/01/24 1000 06/02/24 0252 06/02/24 1237 06/03/24 0500 06/03/24 1153 06/04/24 0838  NA 139 141 140  --  142  --  140  K 4.0 3.9 4.0 3.7 4.1 4.0 4.1  CL 102 98 105  --  107  --  102  CO2 26 27 26   --  27  --  24  GLUCOSE 101* 199* 125*  --  104*  --  118*  BUN 14 25* 33*  --  28*  --  36*  CREATININE 0.89 1.28* 1.03  --  0.87   --  0.93  CALCIUM  8.9 9.3 8.6*  --  8.4*  --  8.6*  MG 1.8  --  1.9 1.9 2.1 1.9 2.3  PHOS  --   --   --   --  2.8  --   --    Liver Function Tests: Recent Labs  Lab 05/30/24 0808 06/01/24 1000 06/02/24 0252  AST 22 29 27   ALT 15 17 21   ALKPHOS 82 90 74  BILITOT 0.7 0.9 0.8  PROT 6.2* 6.7 5.1*  ALBUMIN 3.3* 3.4* 2.7*   No results for input(s): LIPASE, AMYLASE in the last 168 hours. No results for input(s): AMMONIA in the last 168 hours. Coagulation Profile: Recent Labs  Lab 06/01/24 1000 06/02/24 0252 06/03/24 0500 06/04/24 0256  INR 2.1* 2.2* 1.7* 1.6*   CBC: Recent Labs  Lab 05/30/24 0808 06/01/24 1000 06/02/24 0252 06/03/24 0500 06/04/24 0256  WBC 15.1*  79.9* 68.5* 58.8* 43.2*  NEUTROABS 9.6* 72.7* 52.7* 43.9*  --   HGB 12.6* 13.4 10.9* 12.1* 12.2*  HCT 39.3 40.1 33.8* 35.9* 36.8*  MCV 104.5* 104.2* 106.0* 105.9* 104.8*  PLT 292 315 227 201 197   Cardiac Enzymes: No results for input(s): CKTOTAL, CKMB, CKMBINDEX, TROPONINI in the last 168 hours. BNP: Invalid input(s): POCBNP CBG: No results for input(s): GLUCAP in the last 168 hours. HbA1C: No results for input(s): HGBA1C in the last 72 hours. Urine analysis:    Component Value Date/Time   COLORURINE YELLOW 06/01/2024 1750   APPEARANCEUR CLEAR 06/01/2024 1750   APPEARANCEUR Clear 05/22/2022 0941   LABSPEC >1.046 (H) 06/01/2024 1750   PHURINE 6.0 06/01/2024 1750   GLUCOSEU NEGATIVE 06/01/2024 1750   HGBUR SMALL (A) 06/01/2024 1750   BILIRUBINUR NEGATIVE 06/01/2024 1750   BILIRUBINUR Negative 05/22/2022 0941   KETONESUR NEGATIVE 06/01/2024 1750   PROTEINUR 30 (A) 06/01/2024 1750   UROBILINOGEN negative 02/14/2015 1247   UROBILINOGEN 0.2 01/21/2015 0850   NITRITE NEGATIVE 06/01/2024 1750   LEUKOCYTESUR NEGATIVE 06/01/2024 1750   Sepsis Labs: @LABRCNTIP (procalcitonin:4,lacticidven:4) ) Recent Results (from the past 240 hours)  Urine Culture (for pregnant, neutropenic or  urologic patients or patients with an indwelling urinary catheter)     Status: Abnormal   Collection Time: 06/01/24  5:50 PM   Specimen: Urine, Clean Catch  Result Value Ref Range Status   Specimen Description   Final    URINE, CLEAN CATCH Performed at [facility], [address] [place], [place] [address]    Special Requests   Final    Immunocompromised Performed at [facility], [address] [place], [place] [address]    Culture (A)  Final    <10,000 COLONIES/mL INSIGNIFICANT GROWTH Performed at [facility], [address] [place], [place] [address]    Report Status 06/02/2024 FINAL  Final  Culture, blood (Routine X 2) w Reflex to ID Panel     Status: None (Preliminary result)   Collection Time: 06/01/24  6:14 PM   Specimen: BLOOD  Result Value Ref Range Status   Specimen Description BLOOD RIGHT ANTECUBITAL  Final   Special Requests   Final    BOTTLES DRAWN AEROBIC ONLY Blood Culture adequate volume   Culture   Final    NO GROWTH 3 DAYS Performed at [facility], [address], [place], [place] [address]    Report Status PENDING  Incomplete  Culture, blood (Routine X 2) w Reflex to ID Panel     Status: None (Preliminary result)   Collection Time: 06/01/24  6:14 PM   Specimen: BLOOD  Result Value Ref Range Status   Specimen Description BLOOD RIGHT ANTECUBITAL  Final   Special Requests   Final    BOTTLES DRAWN AEROBIC AND ANAEROBIC Blood Culture results may not be optimal due to an inadequate volume of blood received in culture bottles   Culture   Final    NO GROWTH 3 DAYS Performed at [facility], [address] [place], [place] [address]    Report Status PENDING  Incomplete  MRSA Next Gen by PCR, Nasal     Status: None   Collection Time: 06/01/24  6:38 PM   Specimen: Nasal Mucosa; Nasal Swab  Result Value Ref Range Status   MRSA by PCR Next Gen NOT DETECTED NOT DETECTED Final    Comment: (NOTE) The GeneXpert MRSA Assay (FDA approved for NASAL specimens  only), is one component of a comprehensive MRSA  colonization surveillance program. It is not intended to diagnose MRSA infection nor to guide or monitor treatment for MRSA infections. Test performance is not FDA approved in patients less than 36 years old. Performed at [facility], [address], [place], [place] [address]      Scheduled Meds:  artificial tears  1 drop Both Eyes Q6H   Chlorhexidine  Gluconate Cloth  6 each Topical Q0600   pantoprazole  (PROTONIX ) IV  40 mg Intravenous Q12H   tobramycin   1 drop Both Eyes Q6H   Continuous Infusions:  heparin  1,200 Units/hr (06/04/24 0845)   lactated ringers      lactated ringers       Procedures/Studies: DG Abd Portable 1V-Small Bowel Obstruction Protocol-initial, 8 hr delay Result Date: 06/03/2024 CLINICAL DATA:  SBO 8-hr delay image EXAM: PORTABLE ABDOMEN - 1 VIEW COMPARISON:  X-ray abdomen 06/03/2024 9:26 a.m. FINDINGS: Enteric tube with tip overlying the gastric lumen and side port overlying the expected region of the gastroesophageal junction. Persistent gaseous small bowel dilatation. PO contrast within the gastric lumen. Right upper quadrant cholelithiasis. Bilateral hip degenerative changes. IMPRESSION: 1. Small-bowel obstruction. 2. PO contrast within the gastric lumen. 3. Enteric tube with tip overlying the gastric lumen and side port overlying the expected region of the gastroesophageal junction. 4. Cholelithiasis. Electronically Signed   By: Morgane  Naveau M.D.   On: 06/03/2024 21:47   DG Abd Portable 1V-Small Bowel Protocol-Position Verification Result Date: 06/03/2024 CLINICAL DATA:  NG tube placement. EXAM: PORTABLE ABDOMEN - 1 VIEW COMPARISON:  Abdominal radiograph and CT 06/01/2024 FINDINGS: Tip of the weighted enteric tube in the stomach, the side port is likely below the diaphragm but not clearly defined on the current exam. Gaseous gastric distension. Gaseous small bowel distention up to 4.2 cm. Small amount of formed stool  is in the colon. Right upper quadrant gallstones. IMPRESSION: 1. Tip of the weighted enteric tube in the stomach, the side port is likely below the diaphragm but not clearly defined on the current exam. 2. Gaseous gastric and small bowel distention, suspicious for obstruction. Electronically Signed   By: Andrea Gasman M.D.   On: 06/03/2024 10:23   DG Chest 1 View Result Date: 06/01/2024 CLINICAL DATA:  NG tube placement EXAM: CHEST  1 VIEW COMPARISON:  06/01/2024, 05/02/2024 FINDINGS: Sternotomy and cardiomegaly with strandy opacities at the bases. Enteric tube tip below the diaphragm, side-port in the region of the stomach, tip is non included. Marked air distension of the stomach IMPRESSION: Enteric tube tip below the diaphragm, side-port in the region of the stomach, tip is non included in the field of view. Marked air distension of the stomach. Electronically Signed   By: Luke Bun M.D.   On: 06/01/2024 20:16   DG Abdomen 1 View Result Date: 06/01/2024 CLINICAL DATA:  NG tube placement- had to put new one in due to getting pulled out. EXAM: ABDOMEN - 1 VIEW COMPARISON:  None Available. FINDINGS: The bowel gas pattern is non-obstructive. No evidence of pneumoperitoneum, within the limitations of a supine film. No acute osseous abnormalities. The soft tissues are within normal limits. Surgical changes, devices, tubes and lines: Interval placement of nasogastric tube with its tip overlying the fundus of the stomach region. However, side hole is just above the left hemidiaphragm in the GE junction region. Recommend further advancement by 2-3 inches to confidently put the side-hole into the stomach. IMPRESSION: *Interval placement of nasogastric tube with its tip overlying the fundus of the stomach region. However, side hole is  just above the left hemidiaphragm in the GE junction region. Recommend further advancement by 2-3 inches to confidently put the side-hole into the stomach. Electronically Signed    By: Ree Molt M.D.   On: 06/01/2024 17:14   ECHOCARDIOGRAM COMPLETE Result Date: 06/01/2024    ECHOCARDIOGRAM REPORT   Patient Name:   SHAHIN KNIERIM Date of Exam: 06/01/2024 Medical Rec #:  [id number]    Height:       66.0 in Accession #:    [id number]   Weight:       172.0 lb Date of Birth:  10/20/1931   BSA:          1.876 m Patient Age:    89 years     BP:           126/85 mmHg Patient Gender: M            HR:           107 bpm. Exam Location:  [facility] Procedure: 2D Echo, Cardiac Doppler and Color Doppler (Both Spectral and Color            Flow Doppler were utilized during procedure). Indications:    A-fib  History:        Patient has no prior history of Echocardiogram examinations.                 Arrythmias:Atrial Fibrillation; Risk Factors:Hypertension.  Sonographer:    Vella Key Referring Phys: Clarissa.Climes Giavanni Zeitlin IMPRESSIONS  1. Challenging study in the setting of tachycardia, HR 120-130s.  2. Left ventricular ejection fraction, by estimation, is 30 to 35%. The left ventricle has moderately decreased function. Left ventricular endocardial border not optimally defined to evaluate regional wall motion. Left ventricular diastolic function could not be evaluated.  3. Right ventricular systolic function was not well visualized. The right ventricular size is moderately enlarged. There is normal pulmonary artery systolic pressure.  4. Left atrial size was severely dilated.  5. Right atrial size was severely dilated.  6. The mitral valve is grossly normal. Mild mitral valve regurgitation. No evidence of mitral stenosis.  7. The aortic valve was not well visualized. Aortic valve regurgitation is not visualized. No aortic stenosis is present.  8. Aortic dilatation noted. There is mild dilatation of the aortic root, measuring 40 mm.  9. The inferior vena cava is dilated in size with >50% respiratory variability, suggesting right atrial pressure of 8 mmHg. Comparison(s): No prior Echocardiogram. FINDINGS  Left  Ventricle: Left ventricular ejection fraction, by estimation, is 30 to 35%. The left ventricle has moderately decreased function. Left ventricular endocardial border not optimally defined to evaluate regional wall motion. Strain was performed and the global longitudinal strain is indeterminate. The left ventricular internal cavity size was normal in size. There is no left ventricular hypertrophy. Left ventricular diastolic function could not be evaluated due to atrial fibrillation. Left ventricular diastolic function could not be evaluated. Right Ventricle: The right ventricular size is moderately enlarged. No increase in right ventricular wall thickness. Right ventricular systolic function was not well visualized. There is normal pulmonary artery systolic pressure. The tricuspid regurgitant velocity is 2.60 m/s, and with an assumed right atrial pressure of 8 mmHg, the estimated right ventricular systolic pressure is 35.0 mmHg. Left Atrium: Left atrial size was severely dilated. Right Atrium: Right atrial size was severely dilated. Pericardium: There is no evidence of pericardial effusion. Mitral Valve: The mitral valve is grossly normal. Mild mitral valve regurgitation. No evidence  of mitral valve stenosis. Tricuspid Valve: The tricuspid valve is normal in structure. Tricuspid valve regurgitation is mild . No evidence of tricuspid stenosis. Aortic Valve: The aortic valve was not well visualized. Aortic valve regurgitation is not visualized. No aortic stenosis is present. Pulmonic Valve: The pulmonic valve was not well visualized. Pulmonic valve regurgitation is not visualized. No evidence of pulmonic stenosis. Aorta: Aortic dilatation noted. There is mild dilatation of the aortic root, measuring 40 mm. Venous: The inferior vena cava is dilated in size with greater than 50% respiratory variability, suggesting right atrial pressure of 8 mmHg. IAS/Shunts: No atrial level shunt detected by color flow Doppler.  Additional Comments: 3D was performed not requiring image post processing on an independent workstation and was indeterminate.  LEFT VENTRICLE PLAX 2D LVIDd:         4.80 cm      Diastology LVIDs:         4.20 cm      LV e' medial:    4.35 cm/s LV PW:         1.20 cm      LV E/e' medial:  26.9 LV IVS:        1.10 cm      LV e' lateral:   3.92 cm/s LVOT diam:     1.70 cm      LV E/e' lateral: 29.8 LV SV:         25 LV SV Index:   13 LVOT Area:     2.27 cm  LV Volumes (MOD) LV vol d, MOD A2C: 111.0 ml LV vol d, MOD A4C: 96.9 ml LV vol s, MOD A2C: 58.0 ml LV vol s, MOD A4C: 51.7 ml LV SV MOD A2C:     53.0 ml LV SV MOD A4C:     96.9 ml LV SV MOD BP:      51.5 ml RIGHT VENTRICLE RV Basal diam:  4.60 cm RV S prime:     8.49 cm/s TAPSE (M-mode): 1.2 cm LEFT ATRIUM              Index        RIGHT ATRIUM           Index LA diam:        6.40 cm  3.41 cm/m   RA Area:     26.50 cm LA Vol (A2C):   111.0 ml 59.18 ml/m  RA Volume:   87.50 ml  46.65 ml/m LA Vol (A4C):   108.0 ml 57.58 ml/m LA Biplane Vol: 120.0 ml 63.98 ml/m  AORTIC VALVE LVOT Vmax:   64.60 cm/s LVOT Vmean:  48.900 cm/s LVOT VTI:    0.110 m  AORTA Ao Root diam: 4.00 cm MITRAL VALVE                TRICUSPID VALVE MV Area (PHT): 12.64 cm    TV Peak grad:   27.0 mmHg MV Decel Time: 60 msec      TV Vmax:        2.60 m/s MV E velocity: 117.00 cm/s  TR Peak grad:   27.0 mmHg MV A velocity: 50.70 cm/s   TR Vmax:        260.00 cm/s MV E/A ratio:  2.31                             SHUNTS  Systemic VTI:  0.11 m                             Systemic Diam: 1.70 cm Vishnu Priya Mallipeddi Electronically signed by Diannah Late Mallipeddi Signature Date/Time: 06/01/2024/4:13:53 PM    Final    DG Abdomen 1 View Result Date: 06/01/2024 CLINICAL DATA:  NG tube placement EXAM: ABDOMEN - 1 VIEW COMPARISON:  06/01/2024 FINDINGS: Weighted feeding tube terminates in the distended stomach. Redemonstrated findings of small-bowel obstruction. No  pneumoperitoneum. No organomegaly or radiopaque calculi. Osteopenia. Bilateral hip arthroplasties. Cardiomegaly. Sternotomy wires and CABG markers. IMPRESSION: Weighted feeding tube terminates in the distended stomach. Electronically Signed   By: Rogelia Myers M.D.   On: 06/01/2024 13:54   CT ABDOMEN PELVIS W CONTRAST Result Date: 06/01/2024 CLINICAL DATA:  Abdominal pain, acute nonlocalized.  Vomiting blood. EXAM: CT ABDOMEN AND PELVIS WITH CONTRAST TECHNIQUE: Multidetector CT imaging of the abdomen and pelvis was performed using the standard protocol following bolus administration of intravenous contrast. RADIATION DOSE REDUCTION: This exam was performed according to the departmental dose-optimization program which includes automated exposure control, adjustment of the mA and/or kV according to patient size and/or use of iterative reconstruction technique. CONTRAST:  OMNIPAQUE  IOHEXOL  300 MG/ML  SOLN COMPARISON:  CT chest 05/02/2024 FINDINGS: Lower chest: Chronic scarring at the lung bases. Known adenopathy in the right hilum. See results of previous chest CT. Hepatobiliary: Cirrhosis of the liver with enlarged left lobe and nodular surface of the liver. No sign of liver mass. Multiple calcified gallstones without CT evidence of cholecystitis or obstruction. Pancreas: Normal Spleen: Normal Adrenals/Urinary Tract: Adrenal glands are normal. No renal obstruction or worrisome lesion. Simple appearing cysts. Bladder poorly seen because of artifact from hip replacements. Stomach/Bowel: Fluid distension of the stomach and small intestine to the midportion of the small intestine, with relative collapse of the small intestine distal to that. Findings consistent with high-grade small bowel obstruction. Specific etiology not specifically elucidated. Ordinary diverticulosis of the left colon without evidence of diverticulitis. Vascular/Lymphatic: Aortic atherosclerosis. No aneurysm. IVC is normal. No adenopathy.  Reproductive: Normal other than enlarged prostate. Other: No free fluid or air. Musculoskeletal: subacute compression fracture of T12 with loss of height of 50%. No gross retropulsion. This was present in May. IMPRESSION: 1. High-grade small bowel obstruction with fluid distension of the stomach and small intestine to the midportion of the small intestine, with relative collapse of the small intestine distal to that. Specific etiology not elucidated. 2. Cirrhosis of the liver. 3. Cholelithiasis without CT evidence of cholecystitis or obstruction. 4. Aortic atherosclerosis. 5. Subacute compression fracture of T12 with loss of height of 50%. No gross retropulsion. This was present in May. 6. Known adenopathy in the right hilum. See results of previous chest CT. Aortic Atherosclerosis (ICD10-I70.0). Electronically Signed   By: Oneil Officer M.D.   On: 06/01/2024 11:33   DG Chest Port 1 View Result Date: 06/01/2024 EXAM: 1 VIEW XRAY OF THE CHEST 06/01/2024 10:10:00 AM COMPARISON: AP radiograph of the chest dated 06/09/2022. CLINICAL HISTORY: [id number] GI bleed [id number]. Pt bib ems from home with reports of vomiting blood. Family reports it looked like coffee ground emesis. Onset yesterday afternoon. Abdomen hard and distended which is new per family. Last chemo 2 days ago. Complaining of chest pain, reports that is ; where his cancer is. Afib on monitor. Given 4mg  zofran  en route. ; 120/60; HR 100-130; 93% RA. Hx of afib,  [name], hypertension, CABG. Former smoker FINDINGS: LUNGS AND PLEURA: Coarse reticular opacities present within the mid and lower lung zones, which appear chronic. No focal pulmonary opacity. No pulmonary edema. No pleural effusion. No pneumothorax. HEART AND MEDIASTINUM: The heart is enlarged. The patient is status post CABG. No acute abnormality of the cardiac and mediastinal silhouettes. BONES AND SOFT TISSUES: No acute osseous abnormality. A left subclavian chest border is present as before. IMPRESSION:  1. No acute findings. 2. Enlarged heart and status post CABG. 3. Chronic coarse reticular opacities in the mid and lower lung zones. Electronically signed by: Evalene Coho MD 06/01/2024 10:30 AM EDT RP Workstation: DARYLENE Alm Schneider, DO  [facility]  If 7PM-7AM, please contact night-coverage [email or web address] Password [email or web address] 06/04/2024, 9:30 AM   LOS: 3 days

## 2024-06-04 NOTE — Consult Note (Signed)
 PHARMACY - ANTICOAGULATION CONSULT NOTE  Pharmacy Consult for heparin  infusion Indication: atrial fibrillation  Allergies  Allergen Reactions   Penicillins Hives and Rash    Has patient had a PCN reaction causing immediate rash, facial/tongue/throat swelling, SOB or lightheadedness with hypotension: Yes Has patient had a PCN reaction causing severe rash involving mucus membranes or skin necrosis: Yes Has patient had a PCN reaction that required hospitalization: No Has patient had a PCN reaction occurring within the last 10 years: No If all of the above answers are NO, then may proceed with Cephalosporin use.    Cetuximab  Cough    Cough and scratchy throat. Drug rechallenged and pt able to tolerated the rest of the infusion without any complications.    Hct [Hydrochlorothiazide] Other (See Comments)    hyper   Statins Other (See Comments)    Myalgia, tolerates low dosages of lovastatin      Patient Measurements: Height: 5' 6 (167.6 cm) Weight: 80.1 kg (176 lb 9.4 oz) IBW/kg (Calculated) : 63.8 HEPARIN  DW (KG): 79.9  Vital Signs: Temp: 98.3 F (36.8 C) (08/24 0434) Temp Source: Axillary (08/24 0434) BP: 130/70 (08/24 0300) Pulse Rate: 60 (08/24 0300)  Labs: Recent Labs    06/01/24 1000 06/01/24 1206 06/02/24 0252 06/03/24 0500 06/03/24 2013 06/04/24 0256 06/04/24 0533  HGB 13.4  --  10.9* 12.1*  --  12.2*  --   HCT 40.1  --  33.8* 35.9*  --  36.8*  --   PLT 315  --  227 201  --  197  --   APTT  --   --   --   --  82*  --   --   LABPROT 24.9*  --  25.5* 20.4*  --  19.8*  --   INR 2.1*  --  2.2* 1.7*  --  1.6*  --   HEPARINUNFRC  --   --   --   --   --  >1.10* 0.24*  CREATININE 1.28*  --  1.03 0.87  --   --   --   TROPONINIHS 9 10  --   --   --   --   --     Estimated Creatinine Clearance: 57.2 mL/min (by C-G formula based on SCr of 0.87 mg/dL).   Medical History: Past Medical History:  Diagnosis Date   Anemia    Atrial fibrillation (HCC)    Cataract     Cellulitis    In past   Complication of anesthesia    HARD TIME WAKING; CAUSES MY BP TO GO UP    Coronary artery disease    5 bypasses   DJD (degenerative joint disease)    Ejection fraction < 50%    Mildly reduced, 40% by echo   GERD (gastroesophageal reflux disease)    Hyperlipidemia    Hypertension    Persistent atrial fibrillation (HCC)    Prostate hypertrophy    on CT scan 09/2014   PUD (peptic ulcer disease)    RESOLVED    Skin cancer of eyelid    Resected    Medications:  Patient on Warfarin 5 mg Sun/Wed and 2.5 mg AOD, currently on hold for possible procedure  Assessment: 89 yo male presented to ED on 8/21 with complaint of abdominal pain, nausea, and vomiting.  Patient admitted for treatment of small bowel obstruction.  Patient anticoagulated with Warfarin for Afib, this is currently being held.  Pharmacy consulted to initiate heparin  infusion.  8/24 AM update: -Heparin  level  sub-therapeutic at 0.24 -HL of >1.1 was drawn from port where heparin  was running  Goal of Therapy:  Heparin  level 0.3-0.7 units/ml Monitor platelets by anticoagulation protocol: Yes   Plan:  Inc heparin  to 1200 units/hr Heparin  level in 8 hours  Lynwood Mckusick, PharmD, BCPS Clinical Pharmacist Phone: [phone]

## 2024-06-04 NOTE — Progress Notes (Signed)
 Subjective: Resting comfortably.  Denies any abdominal pain.  States he has had some flatus but no bowel movement yet.  Objective: Vital signs in last 24 hours: Temp:  [97.6 F (36.4 C)-98.5 F (36.9 C)] 97.6 F (36.4 C) (08/24 0729) Pulse Rate:  [60-149] 104 (08/24 0800) Resp:  [14-23] 19 (08/24 0800) BP: (106-152)/(56-112) 145/84 (08/24 0800) SpO2:  [93 %-97 %] 94 % (08/24 0800) Last BM Date : 05/30/24  Intake/Output from previous day: 08/23 0701 - 08/24 0700 In: 526.7 [I.V.:140.1; IV Piggyback:386.6] Out: 2150 [Urine:550; Emesis/NG output:1600] Intake/Output this shift: No intake/output data recorded.  General appearance: alert, cooperative, and no distress GI: Soft, distended.  Occasional bowel sounds appreciated.  Nontender.  Lab Results:  Recent Labs    06/03/24 0500 06/04/24 0256  WBC 58.8* 43.2*  HGB 12.1* 12.2*  HCT 35.9* 36.8*  PLT 201 197   BMET Recent Labs    06/02/24 0252 06/02/24 1237 06/03/24 0500 06/03/24 1153  NA 140  --  142  --   K 4.0   < > 4.1 4.0  CL 105  --  107  --   CO2 26  --  27  --   GLUCOSE 125*  --  104*  --   BUN 33*  --  28*  --   CREATININE 1.03  --  0.87  --   CALCIUM  8.6*  --  8.4*  --    < > = values in this interval not displayed.   PT/INR Recent Labs    06/03/24 0500 06/04/24 0256  LABPROT 20.4* 19.8*  INR 1.7* 1.6*    Studies/Results: DG Abd Portable 1V-Small Bowel Obstruction Protocol-initial, 8 hr delay Result Date: 06/03/2024 CLINICAL DATA:  SBO 8-hr delay image EXAM: PORTABLE ABDOMEN - 1 VIEW COMPARISON:  X-ray abdomen 06/03/2024 9:26 a.m. FINDINGS: Enteric tube with tip overlying the gastric lumen and side port overlying the expected region of the gastroesophageal junction. Persistent gaseous small bowel dilatation. PO contrast within the gastric lumen. Right upper quadrant cholelithiasis. Bilateral hip degenerative changes. IMPRESSION: 1. Small-bowel obstruction. 2. PO contrast within the gastric lumen. 3.  Enteric tube with tip overlying the gastric lumen and side port overlying the expected region of the gastroesophageal junction. 4. Cholelithiasis. Electronically Signed   By: Morgane  Naveau M.D.   On: 06/03/2024 21:47   DG Abd Portable 1V-Small Bowel Protocol-Position Verification Result Date: 06/03/2024 CLINICAL DATA:  NG tube placement. EXAM: PORTABLE ABDOMEN - 1 VIEW COMPARISON:  Abdominal radiograph and CT 06/01/2024 FINDINGS: Tip of the weighted enteric tube in the stomach, the side port is likely below the diaphragm but not clearly defined on the current exam. Gaseous gastric distension. Gaseous small bowel distention up to 4.2 cm. Small amount of formed stool is in the colon. Right upper quadrant gallstones. IMPRESSION: 1. Tip of the weighted enteric tube in the stomach, the side port is likely below the diaphragm but not clearly defined on the current exam. 2. Gaseous gastric and small bowel distention, suspicious for obstruction. Electronically Signed   By: Andrea Gasman M.D.   On: 06/03/2024 10:23    Anti-infectives: Anti-infectives (From admission, onward)    None       Assessment/Plan: Impression: Small bowel obstruction protocol study suspicious for obstruction.  Patient has had 2300 cc out his NG tube.  Still having rapid A-fib.  Given these findings, patient may require exploratory laparotomy.  Given his multiple comorbidities including a cardiac ejection fraction of 30%, the patient needs a higher  level of care that could be offered at [facility].  I have discussed this with Dr. Evonnie.  I have discussed this with Dr. Stevie of general surgery at [facility] who states he will see the patient once he is transferred.  I discussed this with the patient and family who understand and agree.  LOS: 3 days    [name] 06/04/2024

## 2024-06-04 NOTE — Consult Note (Signed)
 PHARMACY - ANTICOAGULATION CONSULT NOTE  Pharmacy Consult for heparin  infusion Indication: atrial fibrillation  Allergies  Allergen Reactions   Penicillins Hives and Rash    Has patient had a PCN reaction causing immediate rash, facial/tongue/throat swelling, SOB or lightheadedness with hypotension: Yes Has patient had a PCN reaction causing severe rash involving mucus membranes or skin necrosis: Yes Has patient had a PCN reaction that required hospitalization: No Has patient had a PCN reaction occurring within the last 10 years: No If all of the above answers are NO, then may proceed with Cephalosporin use.    Cetuximab  Cough    Cough and scratchy throat. Drug rechallenged and pt able to tolerated the rest of the infusion without any complications.    Hct [Hydrochlorothiazide] Other (See Comments)    hyper   Statins Other (See Comments)    Myalgia, tolerates low dosages of lovastatin      Patient Measurements: Height: 5' 6 (167.6 cm) Weight: 80.1 kg (176 lb 9.4 oz) IBW/kg (Calculated) : 63.8 HEPARIN  DW (KG): 79.9  Vital Signs: Temp: 97.6 F (36.4 C) (08/24 0729) Temp Source: Oral (08/24 0729) BP: 121/86 (08/24 0700) Pulse Rate: 68 (08/24 0700)  Labs: Recent Labs    06/01/24 1000 06/01/24 1206 06/02/24 0252 06/03/24 0500 06/03/24 2013 06/04/24 0256 06/04/24 0533  HGB 13.4  --  10.9* 12.1*  --  12.2*  --   HCT 40.1  --  33.8* 35.9*  --  36.8*  --   PLT 315  --  227 201  --  197  --   APTT  --   --   --   --  82*  --   --   LABPROT 24.9*  --  25.5* 20.4*  --  19.8*  --   INR 2.1*  --  2.2* 1.7*  --  1.6*  --   HEPARINUNFRC  --   --   --   --   --  >1.10* 0.24*  CREATININE 1.28*  --  1.03 0.87  --   --   --   TROPONINIHS 9 10  --   --   --   --   --     Estimated Creatinine Clearance: 57.2 mL/min (by C-G formula based on SCr of 0.87 mg/dL).   Medical History: Past Medical History:  Diagnosis Date   Anemia    Atrial fibrillation (HCC)    Cataract     Cellulitis    In past   Complication of anesthesia    HARD TIME WAKING; CAUSES MY BP TO GO UP    Coronary artery disease    5 bypasses   DJD (degenerative joint disease)    Ejection fraction < 50%    Mildly reduced, 40% by echo   GERD (gastroesophageal reflux disease)    Hyperlipidemia    Hypertension    Persistent atrial fibrillation (HCC)    Prostate hypertrophy    on CT scan 09/2014   PUD (peptic ulcer disease)    RESOLVED    Skin cancer of eyelid    Resected    Medications:  Patient on Warfarin 5 mg Sun/Wed and 2.5 mg AOD, currently on hold for possible procedure  Assessment: 89 yo male presented to ED on 8/21 with complaint of abdominal pain, nausea, and vomiting.  Patient admitted for treatment of small bowel obstruction.  Patient anticoagulated with Warfarin for Afib, this is currently being held.  Pharmacy consulted to initiate heparin  infusion.  8/24 pm update: -Heparin  level  sub-therapeutic at 0.22 -No bleeding issues noted.   Goal of Therapy:  Heparin  level 0.3-0.7 units/ml Monitor platelets by anticoagulation protocol: Yes   Plan:  Increase heparin  to 1350 units/hr Heparin  level in 8 hours  [name] PharmD., BCPS Clinical Pharmacist 06/04/2024 8:04 AM

## 2024-06-05 ENCOUNTER — Inpatient Hospital Stay (HOSPITAL_COMMUNITY)

## 2024-06-05 DIAGNOSIS — I4891 Unspecified atrial fibrillation: Secondary | ICD-10-CM

## 2024-06-05 DIAGNOSIS — C9110 Chronic lymphocytic leukemia of B-cell type not having achieved remission: Secondary | ICD-10-CM | POA: Diagnosis not present

## 2024-06-05 DIAGNOSIS — I5022 Chronic systolic (congestive) heart failure: Secondary | ICD-10-CM | POA: Diagnosis not present

## 2024-06-05 DIAGNOSIS — I4721 Torsades de pointes: Secondary | ICD-10-CM | POA: Diagnosis not present

## 2024-06-05 DIAGNOSIS — K56609 Unspecified intestinal obstruction, unspecified as to partial versus complete obstruction: Secondary | ICD-10-CM | POA: Diagnosis not present

## 2024-06-05 DIAGNOSIS — I2510 Atherosclerotic heart disease of native coronary artery without angina pectoris: Secondary | ICD-10-CM | POA: Diagnosis not present

## 2024-06-05 DIAGNOSIS — I4821 Permanent atrial fibrillation: Secondary | ICD-10-CM | POA: Diagnosis not present

## 2024-06-05 LAB — CBC
HCT: 35.9 % — ABNORMAL LOW (ref 39.0–52.0)
Hemoglobin: 11.8 g/dL — ABNORMAL LOW (ref 13.0–17.0)
MCH: 34.1 pg — ABNORMAL HIGH (ref 26.0–34.0)
MCHC: 32.9 g/dL (ref 30.0–36.0)
MCV: 103.8 fL — ABNORMAL HIGH (ref 80.0–100.0)
Platelets: 177 K/uL (ref 150–400)
RBC: 3.46 MIL/uL — ABNORMAL LOW (ref 4.22–5.81)
RDW: 14.1 % (ref 11.5–15.5)
WBC: 25.9 K/uL — ABNORMAL HIGH (ref 4.0–10.5)
nRBC: 0 % (ref 0.0–0.2)

## 2024-06-05 LAB — BASIC METABOLIC PANEL WITH GFR
Anion gap: 10 (ref 5–15)
BUN: 36 mg/dL — ABNORMAL HIGH (ref 8–23)
CO2: 27 mmol/L (ref 22–32)
Calcium: 8.8 mg/dL — ABNORMAL LOW (ref 8.9–10.3)
Chloride: 105 mmol/L (ref 98–111)
Creatinine, Ser: 0.9 mg/dL (ref 0.61–1.24)
GFR, Estimated: >60 mL/min (ref >60)
Glucose, Bld: 109 mg/dL — ABNORMAL HIGH (ref 70–99)
Potassium: 4.1 mmol/L (ref 3.5–5.1)
Sodium: 142 mmol/L (ref 135–145)

## 2024-06-05 LAB — HEPARIN LEVEL (UNFRACTIONATED)
Heparin Unfractionated: 0.39 [IU]/mL (ref 0.30–0.70)
Heparin Unfractionated: 0.41 [IU]/mL (ref 0.30–0.70)
Heparin Unfractionated: >1.1 [IU]/mL — ABNORMAL HIGH (ref 0.30–0.70)

## 2024-06-05 LAB — MAGNESIUM: Magnesium: 2.1 mg/dL (ref 1.7–2.4)

## 2024-06-05 LAB — PROTIME-INR
INR: 1.2 (ref 0.8–1.2)
Prothrombin Time: 15.7 s — ABNORMAL HIGH (ref 11.4–15.2)

## 2024-06-05 LAB — GLUCOSE, CAPILLARY
Glucose-Capillary: 113 mg/dL — ABNORMAL HIGH (ref 70–99)
Glucose-Capillary: 87 mg/dL (ref 70–99)
Glucose-Capillary: 91 mg/dL (ref 70–99)
Glucose-Capillary: 92 mg/dL (ref 70–99)

## 2024-06-05 MED ORDER — DILTIAZEM HCL 25 MG/5ML IV SOLN
5.0000 mg | Freq: Once | INTRAVENOUS | Status: AC
  Administered 2024-06-05: 5 mg via INTRAVENOUS
  Filled 2024-06-05: qty 5

## 2024-06-05 MED ORDER — METOPROLOL TARTRATE 5 MG/5ML IV SOLN
7.5000 mg | Freq: Four times a day (QID) | INTRAVENOUS | Status: DC
  Administered 2024-06-05 (×4): 7.5 mg via INTRAVENOUS
  Filled 2024-06-05 (×4): qty 10

## 2024-06-05 MED ORDER — PROCHLORPERAZINE EDISYLATE 10 MG/2ML IJ SOLN
10.0000 mg | Freq: Four times a day (QID) | INTRAMUSCULAR | Status: DC | PRN
  Administered 2024-06-05: 10 mg via INTRAVENOUS
  Filled 2024-06-05: qty 2

## 2024-06-05 MED ORDER — DIGOXIN 0.25 MG/ML IJ SOLN
0.2500 mg | Freq: Once | INTRAMUSCULAR | Status: AC
  Administered 2024-06-05: 0.25 mg via INTRAVENOUS
  Filled 2024-06-05: qty 2

## 2024-06-05 MED ORDER — METOPROLOL TARTRATE 5 MG/5ML IV SOLN: 5.0000 mg | Freq: Four times a day (QID) | INTRAVENOUS | Status: DC

## 2024-06-05 MED ORDER — DIGOXIN 0.25 MG/ML IJ SOLN
0.1250 mg | Freq: Every day | INTRAMUSCULAR | Status: DC
  Administered 2024-06-06 – 2024-06-08 (×3): 0.125 mg via INTRAVENOUS
  Filled 2024-06-05 (×4): qty 0.5

## 2024-06-05 NOTE — Progress Notes (Signed)
 Rounding Note   Patient Name: [name] Date of Encounter: 06/05/2024  [facility] Cardiologist: Lynwood Schilling, MD   Subjective Some vomiting this morning.   Scheduled Meds:  artificial tears  1 drop Both Eyes Q6H   Chlorhexidine  Gluconate Cloth  6 each Topical Q0600   metoprolol  tartrate  5 mg Intravenous Q6H   pantoprazole  (PROTONIX ) IV  40 mg Intravenous Q12H   tobramycin   1 drop Both Eyes Q6H   Continuous Infusions:  heparin  1,200 Units/hr (06/04/24 1536)   lactated ringers  50 mL/hr at 06/04/24 2157   PRN Meds: fentaNYL  (SUBLIMAZE ) injection, phenol, prochlorperazine    Vital Signs  Vitals:   06/05/24 0530 06/05/24 0630 06/05/24 0700 06/05/24 0753  BP: (!) 159/93 (!) 160/101 (!) 138/96   Pulse:  (!) 139 (!) 137   Resp: 19 18 15    Temp:    98.1 F (36.7 C)  TempSrc:    Oral  SpO2:  96% 95%   Weight:      Height:        Intake/Output Summary (Last 24 hours) at 06/05/2024 0821 Last data filed at 06/05/2024 0640 Gross per 24 hour  Intake 929.16 ml  Output 350 ml  Net 579.16 ml      06/01/2024    7:02 PM 05/30/2024    8:07 AM 05/25/2024   12:47 PM  Last 3 Weights  Weight (lbs) 176 lb 9.4 oz 172 lb 170 lb  Weight (kg) 80.1 kg 78.019 kg 77.111 kg      Telemetry Afib with RVR - Personally Reviewed  ECG  N/a - Personally Reviewed  Physical Exam  GEN: No acute distress.   Neck: No JVD Cardiac: irreg Respiratory: Clear to auscultation bilaterally. GI: Soft, nontender, non-distended  MS: No edema; No deformity. Neuro:  Nonfocal  Psych: Normal affect   Labs High Sensitivity Troponin:   Recent Labs  Lab 06/01/24 1000 06/01/24 1206  TROPONINIHS 9 10     Chemistry Recent Labs  Lab 05/30/24 0808 06/01/24 1000 06/02/24 0252 06/02/24 1237 06/03/24 0500 06/03/24 1153 06/04/24 0838 06/05/24 0027  NA 139 141 140  --  142  --  140 142  K 4.0 3.9 4.0   < > 4.1 4.0 4.1 4.1  CL 102 98 105  --  107  --  102 105  CO2 26 27 26   --  27   --  24 27  GLUCOSE 101* 199* 125*  --  104*  --  118* 109*  BUN 14 25* 33*  --  28*  --  36* 36*  CREATININE 0.89 1.28* 1.03  --  0.87  --  0.93 0.90  CALCIUM  8.9 9.3 8.6*  --  8.4*  --  8.6* 8.8*  MG 1.8  --  1.9   < > 2.1 1.9 2.3 2.1  PROT 6.2* 6.7 5.1*  --   --   --   --   --   ALBUMIN 3.3* 3.4* 2.7*  --   --   --   --   --   AST 22 29 27   --   --   --   --   --   ALT 15 17 21   --   --   --   --   --   ALKPHOS 82 90 74  --   --   --   --   --   BILITOT 0.7 0.9 0.8  --   --   --   --   --  GFRNONAA >60 53* >60  --  >60  --  >60 >60  ANIONGAP 11 16* 9  --  8  --  14 10   < > = values in this interval not displayed.    Lipids No results for input(s): CHOL, TRIG, HDL, LABVLDL, LDLCALC, CHOLHDL in the last 168 hours.  Hematology Recent Labs  Lab 06/03/24 0500 06/04/24 0256 06/05/24 0534  WBC 58.8* 43.2* 25.9*  RBC 3.39* 3.51* 3.46*  HGB 12.1* 12.2* 11.8*  HCT 35.9* 36.8* 35.9*  MCV 105.9* 104.8* 103.8*  MCH 35.7* 34.8* 34.1*  MCHC 33.7 33.2 32.9  RDW 14.5 14.5 14.1  PLT 201 197 177   Thyroid  No results for input(s): TSH, FREET4 in the last 168 hours.  BNPNo results for input(s): BNP, PROBNP in the last 168 hours.  DDimer No results for input(s): DDIMER in the last 168 hours.   Radiology  DG Abd 1 View Result Date: 06/04/2024 CLINICAL DATA:  Small bowel obstructionr. EXAM: ABDOMEN - 1 VIEW COMPARISON:  06/03/2024 FINDINGS: NG tube tip is identified in the stomach. Diffuse gaseous distention of small bowel persists with similar appearance. Gas-filled dilated small bowel in the low abdomen measures 5.2 cm today compared to 4.9 cm previously. Air in stool is seen in a nondilated transverse and left colon. Stones in the right upper quadrant compatible with gallstones as seen on CT 06/01/2024. Oral contrast material is visible in the stomach previously but is no longer present. No opacification of small bowel or colonic contents evident. Patient is status post  bilateral hip replacement. IMPRESSION: Persistent diffuse gaseous distention of small bowel with similar appearance to prior. Imaging features remain compatible with small bowel obstruction. Electronically Signed   By: Camellia Candle M.D.   On: 06/04/2024 09:52   DG Abd Portable 1V-Small Bowel Obstruction Protocol-initial, 8 hr delay Result Date: 06/03/2024 CLINICAL DATA:  SBO 8-hr delay image EXAM: PORTABLE ABDOMEN - 1 VIEW COMPARISON:  X-ray abdomen 06/03/2024 9:26 a.m. FINDINGS: Enteric tube with tip overlying the gastric lumen and side port overlying the expected region of the gastroesophageal junction. Persistent gaseous small bowel dilatation. PO contrast within the gastric lumen. Right upper quadrant cholelithiasis. Bilateral hip degenerative changes. IMPRESSION: 1. Small-bowel obstruction. 2. PO contrast within the gastric lumen. 3. Enteric tube with tip overlying the gastric lumen and side port overlying the expected region of the gastroesophageal junction. 4. Cholelithiasis. Electronically Signed   By: Morgane  Naveau M.D.   On: 06/03/2024 21:47   DG Abd Portable 1V-Small Bowel Protocol-Position Verification Result Date: 06/03/2024 CLINICAL DATA:  NG tube placement. EXAM: PORTABLE ABDOMEN - 1 VIEW COMPARISON:  Abdominal radiograph and CT 06/01/2024 FINDINGS: Tip of the weighted enteric tube in the stomach, the side port is likely below the diaphragm but not clearly defined on the current exam. Gaseous gastric distension. Gaseous small bowel distention up to 4.2 cm. Small amount of formed stool is in the colon. Right upper quadrant gallstones. IMPRESSION: 1. Tip of the weighted enteric tube in the stomach, the side port is likely below the diaphragm but not clearly defined on the current exam. 2. Gaseous gastric and small bowel distention, suspicious for obstruction. Electronically Signed   By: Andrea Gasman M.D.   On: 06/03/2024 10:23        Assessment & Plan  1.Permanent afib - issues  with RVR this admission in setting of SBO - on dilt gtt initially, low bp's. Changed to IV amio - episode of torsades on amio, short  in duration. Electrolytes corrected and amio stopped - most recent QTc - he was dig loaded, maintenance dose not started. Currently on IV lopressor  5mg  every 6 hours while npo for his SOB. BP's tolerating, actually has been hypertensive.  - permanent afib, no plans for trial of DCCV. LAVI 58, severe LAE. Would also have to be on anticoag uninterrupted for 4 weeks after.  - on hep gtt, coumadin  on hold in setting of SBO possible need for surgery.   - increase lopressor  to 7.5mg  qid. If ongoing tachcyardia would add digoxin  0.125mg  IV daily.  - avoid dilt due to prior hypotension and also low EF. Avoid amio due to transient episode of torsades while on earlier in admission.   2. Chronic HFrEF - 03/2024 echo: LVEF 40-45% - 05/2024 echo: LVEF 30-35%, severe BAE - euvolemic, npo for SBO not on medical therapy  3. [name] with prior CABG - no acute issues  4. SBO - per primary team and surgery - higher risk patient, plans for transfer to [facility] for evaluation by surgery there   For questions or updates, please contact [facility] Please consult [email or web address] for contact info under     Signed, Alvan Carrier, MD  06/05/2024, 8:21 AM

## 2024-06-05 NOTE — Consult Note (Signed)
 PHARMACY - ANTICOAGULATION CONSULT NOTE  Pharmacy Consult for heparin  infusion Indication: atrial fibrillation  Allergies  Allergen Reactions   Penicillins Hives and Rash    Has patient had a PCN reaction causing immediate rash, facial/tongue/throat swelling, SOB or lightheadedness with hypotension: Yes Has patient had a PCN reaction causing severe rash involving mucus membranes or skin necrosis: Yes Has patient had a PCN reaction that required hospitalization: No Has patient had a PCN reaction occurring within the last 10 years: No If all of the above answers are NO, then may proceed with Cephalosporin use.    Cetuximab  Cough    Cough and scratchy throat. Drug rechallenged and pt able to tolerated the rest of the infusion without any complications.    Hct [Hydrochlorothiazide] Other (See Comments)    hyper   Statins Other (See Comments)    Myalgia, tolerates low dosages of lovastatin      Patient Measurements: Height: 5' 6 (167.6 cm) Weight: 80.1 kg (176 lb 9.4 oz) IBW/kg (Calculated) : 63.8 HEPARIN  DW (KG): 79.9  Vital Signs: Temp: 98.1 F (36.7 C) (08/25 0753) Temp Source: Oral (08/25 0753) BP: 138/96 (08/25 0700) Pulse Rate: 137 (08/25 0700)  Labs: Recent Labs    06/03/24 0500 06/03/24 2013 06/04/24 0256 06/04/24 0533 06/04/24 0838 06/04/24 1312 06/05/24 0027 06/05/24 0534 06/05/24 0820  HGB 12.1*  --  12.2*  --   --   --   --  11.8*  --   HCT 35.9*  --  36.8*  --   --   --   --  35.9*  --   PLT 201  --  197  --   --   --   --  177  --   APTT  --  82*  --   --   --   --   --   --   --   LABPROT 20.4*  --  19.8*  --   --   --  15.7*  --   --   INR 1.7*  --  1.6*  --   --   --  1.2  --   --   HEPARINUNFRC  --   --  >1.10*   < >  --    < > 0.41 >1.10* 0.39  CREATININE 0.87  --   --   --  0.93  --  0.90  --   --    < > = values in this interval not displayed.    Estimated Creatinine Clearance: 55.3 mL/min (by C-G formula based on SCr of 0.9  mg/dL).   Medical History: Past Medical History:  Diagnosis Date   Anemia    Atrial fibrillation (HCC)    Cataract    Cellulitis    In past   Complication of anesthesia    HARD TIME WAKING; CAUSES MY BP TO GO UP    Coronary artery disease    5 bypasses   DJD (degenerative joint disease)    Ejection fraction < 50%    Mildly reduced, 40% by echo   GERD (gastroesophageal reflux disease)    Hyperlipidemia    Hypertension    Persistent atrial fibrillation (HCC)    Prostate hypertrophy    on CT scan 09/2014   PUD (peptic ulcer disease)    RESOLVED    Skin cancer of eyelid    Resected    Medications:  Patient on Warfarin 5 mg Sun/Wed and 2.5 mg AOD, currently on hold for possible procedure  Assessment: 89 yo male presented to ED on 8/21 with complaint of abdominal pain, nausea, and vomiting.  Patient admitted for treatment of small bowel obstruction.  Patient anticoagulated with Warfarin for Afib, this is currently being held.  Pharmacy consulted to initiate heparin  infusion.  HL 0.39- therapeutic CBC WNL INR 1.2  Goal of Therapy:  Heparin  level 0.3-0.7 units/ml Monitor platelets by anticoagulation protocol: Yes   Plan:  Cont heparin  1350 units/hr Heparin  level with AM labs  Elspeth Sour, PharmD Clinical Pharmacist 06/05/2024 8:58 AM

## 2024-06-05 NOTE — Consult Note (Signed)
 PHARMACY - ANTICOAGULATION CONSULT NOTE  Pharmacy Consult for heparin  infusion Indication: atrial fibrillation  Allergies  Allergen Reactions   Penicillins Hives and Rash    Has patient had a PCN reaction causing immediate rash, facial/tongue/throat swelling, SOB or lightheadedness with hypotension: Yes Has patient had a PCN reaction causing severe rash involving mucus membranes or skin necrosis: Yes Has patient had a PCN reaction that required hospitalization: No Has patient had a PCN reaction occurring within the last 10 years: No If all of the above answers are NO, then may proceed with Cephalosporin use.    Cetuximab  Cough    Cough and scratchy throat. Drug rechallenged and pt able to tolerated the rest of the infusion without any complications.    Hct [Hydrochlorothiazide] Other (See Comments)    hyper   Statins Other (See Comments)    Myalgia, tolerates low dosages of lovastatin      Patient Measurements: Height: 5' 6 (167.6 cm) Weight: 80.1 kg (176 lb 9.4 oz) IBW/kg (Calculated) : 63.8 HEPARIN  DW (KG): 79.9  Vital Signs: Temp: 98 F (36.7 C) (08/25 0001) Temp Source: Axillary (08/25 0001) BP: 162/98 (08/24 2200) Pulse Rate: 150 (08/24 2200)  Labs: Recent Labs    06/02/24 0252 06/03/24 0500 06/03/24 2013 06/04/24 0256 06/04/24 0256 06/04/24 0533 06/04/24 0838 06/04/24 1312 06/05/24 0027  HGB 10.9* 12.1*  --  12.2*  --   --   --   --   --   HCT 33.8* 35.9*  --  36.8*  --   --   --   --   --   PLT 227 201  --  197  --   --   --   --   --   APTT  --   --  82*  --   --   --   --   --   --   LABPROT 25.5* 20.4*  --  19.8*  --   --   --   --  15.7*  INR 2.2* 1.7*  --  1.6*  --   --   --   --  1.2  HEPARINUNFRC  --   --   --  >1.10*   < > 0.24*  --  0.22* 0.41  CREATININE 1.03 0.87  --   --   --   --  0.93  --  0.90   < > = values in this interval not displayed.    Estimated Creatinine Clearance: 55.3 mL/min (by C-G formula based on SCr of 0.9  mg/dL).   Medical History: Past Medical History:  Diagnosis Date   Anemia    Atrial fibrillation (HCC)    Cataract    Cellulitis    In past   Complication of anesthesia    HARD TIME WAKING; CAUSES MY BP TO GO UP    Coronary artery disease    5 bypasses   DJD (degenerative joint disease)    Ejection fraction < 50%    Mildly reduced, 40% by echo   GERD (gastroesophageal reflux disease)    Hyperlipidemia    Hypertension    Persistent atrial fibrillation (HCC)    Prostate hypertrophy    on CT scan 09/2014   PUD (peptic ulcer disease)    RESOLVED    Skin cancer of eyelid    Resected    Medications:  Patient on Warfarin 5 mg Sun/Wed and 2.5 mg AOD, currently on hold for possible procedure  Assessment: 89 yo male presented to  ED on 8/21 with complaint of abdominal pain, nausea, and vomiting.  Patient admitted for treatment of small bowel obstruction.  Patient anticoagulated with Warfarin for Afib, this is currently being held.  Pharmacy consulted to initiate heparin  infusion.  8/25 AM update: -Heparin  level therapeutic after rate increase  Goal of Therapy:  Heparin  level 0.3-0.7 units/ml Monitor platelets by anticoagulation protocol: Yes   Plan:  Cont heparin  1350 units/hr Heparin  level with AM labs  Lynwood Mckusick, PharmD, BCPS Clinical Pharmacist Phone: [phone]

## 2024-06-05 NOTE — Progress Notes (Signed)
 PROGRESS NOTE  [name] FMW:[id number] DOB: [birth year] DOA: 06/01/2024 PCP: [name], [name], MD  Brief History:  89 year old male with a history of coronary disease, hypertension, hyperlipidemia, persistent atrial fibrillation, squamous cell carcinoma of the neck, and CLL presenting with 2-day history of nausea, vomiting, and abdominal pain.  The patient most recently received his infusion of carboplatin  for his cancer on 05/30/2024.  On 05/13/2024, the patient received pegfilgrastim .  He began having abdominal pain with nausea and vomiting.  He endorsed vomiting some coffee grounds emesis but denies any frank bloody emesis.  There is no melena or hematochezia.  After he began vomiting, the patient began complaining of upper abdominal pain and chest pain.  He denies any fevers, chills, headache, coughing, hemoptysis, worsening shortness of breath.  He denies any hematuria but has some dysuria.  He denies any diarrhea.  His last bowel movement was 05/31/2024.  There is no hematochezia or melena. Because of worsening abdominal pain and vomiting he presented for further evaluation and treatment.  He denies any new medications. In the ED, the patient was afebrile and hemodynamically stable.  He was tachycardic in the 120s.  Oxygen  saturation was 95% room air.  WBC 79.9, hemoglobin 13.4, platelets 315.  Sodium 141, potassium 3.9, bicarbonate 27, serum creatinine 1.28.  LFTs were unremarkable.  CT of the abdomen and pelvis showed high-grade SBO with fluid distention of the stomach and small bowel to the distal portion of the small bowel.  There was liver cirrhosis.  There is cholelithiasis without biliary ductal dilatation.  Chest x-ray showed reticular opacities.  The patient was given a liter of normal saline.  He was given pantoprazole  and Zofran .  4 mg IV morphine  was given in the emergency department.  General surgery was consulted to assist management. NG decompression was initiated.  Patient  was initially placed on diltiazem  drip for afib.  This was transitioned to amiodarone  drip in light of his EF 30-35%.   Assessment/Plan:  Small bowel obstruction -Case was discussed with general surgery, Dr. Mavis - continue NG decompression>>approx 2L out since admission - Continue IV fluids - Remain n.p.o. - Judicious opioids - Optimize electrolytes - passing flatus, no BM - 8/23 SB study--SBO persists - due to comorbidities and low EF, anesthesia at [facility] feels pt needs transferred - 06/04/24--discussed with general surgery, Dr. Dara spoke with [facility], Dr. Rosanne who will consult after pt is transferred -06/05/24--had one episode emesis, passing flatus, no BM;  waiting for bed at [facility]   Persistent atrial fibrillation with RVR - initially diltiazem  drip>>amiodarone  drip due to soft BPs and low EF - started amiodarone >>Torsades - d/c amiodarone >>no more torsades  -  06/01/24 echo EF 30-35% - Holding warfarin in the event of surgical intervention - INR 2.1>>2.2>>1.7 - started IV heparin  bridge 06/03/24 - increase IV metoprolol  to 5 mg q 6 hr   Torsades de Pointes - developed while on amiodarone  - give additional Mag and K -  Keep mag >2 and K >4 - 06/03/24--no further torsades in last  3 days   Chronic HFrEF - 04/05/2017 echo EF 40-45% - 06/01/24 echo EF 30-35% - Patient initially clinically hypovolemic.   Leukemoid reaction - secondary to pegfilgrastim  and stress demargination - No blasts noted on differential - Obtain UA--no pyuria - Blood cultures x 2 sets--neg - personally reviewed CXR--no infiltrates - WBC trending down   Lactic acidosis - Secondary to volume depletion - Given  appropriate fluid boluses - 5.0>>1.5 - Patient is afebrile and hemodynamically stable   Squamous cell carcinoma of the head and neck - Follow-up with medical oncology-Dr. Davonna - Patient last received carboplatin  on 05/30/2024 - Pegfilgrastim  was given 05/31/2024   CLL -  Patient follows with medical oncology - Currently does not meet criteria for treatment - Currently under observation   CKD stage IIIa - Baseline creatinine 0.8-1.1   Essential hypertension -on metoprolol  PTA>>now on IV lopressor  -Patient is on Cardizem  CD 120 mg prior to admission -BP soft>>improving   Mixed hyperlipidemia - Holding statin and Zetia  until able to take po               Family Communication:   daughter 8/24   Consultants:  general surgery   Code Status:  FULL    DVT Prophylaxis:  warfarin     Procedures: As Listed in Progress Note Above   Antibiotics: None             Subjective: Pt had an episode emesis early this am.  Denies cp, sob.  Complains abd pain.  No f/,c headache.  Objective: Vitals:   06/05/24 0430 06/05/24 0530 06/05/24 0630 06/05/24 0700  BP: (!) 175/107 (!) 159/93 (!) 160/101 (!) 138/96  Pulse:   (!) 139 (!) 137  Resp: (!) 23 19 18 15   Temp: 98.4 F (36.9 C)     TempSrc: Oral     SpO2:   96% 95%  Weight:      Height:        Intake/Output Summary (Last 24 hours) at 06/05/2024 0733 Last data filed at 06/05/2024 0640 Gross per 24 hour  Intake 929.16 ml  Output 350 ml  Net 579.16 ml   Weight change:  Exam:  General:  Pt is alert, follows commands appropriately, not in acute distress HEENT: No icterus, No thrush, No neck mass, [place]/AT Cardiovascular: IRRR, S1/S2, no rubs, no gallops Respiratory: bibasilar rales.  Mild basilar wheeze Abdomen: Soft/+BS, mild diffuse tender, non distended, no guarding Extremities: No edema, No lymphangitis, No petechiae, No rashes, no synovitis   Data Reviewed: I have personally reviewed following labs and imaging studies Basic Metabolic Panel: Recent Labs  Lab 06/01/24 1000 06/02/24 0252 06/02/24 1237 06/03/24 0500 06/03/24 1153 06/04/24 0838 06/05/24 0027  NA 141 140  --  142  --  140 142  K 3.9 4.0 3.7 4.1 4.0 4.1 4.1  CL 98 105  --  107  --  102 105  CO2 27 26  --  27   --  24 27  GLUCOSE 199* 125*  --  104*  --  118* 109*  BUN 25* 33*  --  28*  --  36* 36*  CREATININE 1.28* 1.03  --  0.87  --  0.93 0.90  CALCIUM  9.3 8.6*  --  8.4*  --  8.6* 8.8*  MG  --  1.9 1.9 2.1 1.9 2.3 2.1  PHOS  --   --   --  2.8  --   --   --    Liver Function Tests: Recent Labs  Lab 05/30/24 0808 06/01/24 1000 06/02/24 0252  AST 22 29 27   ALT 15 17 21   ALKPHOS 82 90 74  BILITOT 0.7 0.9 0.8  PROT 6.2* 6.7 5.1*  ALBUMIN 3.3* 3.4* 2.7*   No results for input(s): LIPASE, AMYLASE in the last 168 hours. No results for input(s): AMMONIA in the last 168 hours. Coagulation Profile: Recent Labs  Lab 06/01/24 1000 06/02/24 0252 06/03/24 0500 06/04/24 0256 06/05/24 0027  INR 2.1* 2.2* 1.7* 1.6* 1.2   CBC: Recent Labs  Lab 05/30/24 0808 06/01/24 1000 06/02/24 0252 06/03/24 0500 06/04/24 0256 06/05/24 0534  WBC 15.1* 79.9* 68.5* 58.8* 43.2* 25.9*  NEUTROABS 9.6* 72.7* 52.7* 43.9*  --   --   HGB 12.6* 13.4 10.9* 12.1* 12.2* 11.8*  HCT 39.3 40.1 33.8* 35.9* 36.8* 35.9*  MCV 104.5* 104.2* 106.0* 105.9* 104.8* 103.8*  PLT 292 315 227 201 197 177   Cardiac Enzymes: No results for input(s): CKTOTAL, CKMB, CKMBINDEX, TROPONINI in the last 168 hours. BNP: Invalid input(s): POCBNP CBG: No results for input(s): GLUCAP in the last 168 hours. HbA1C: No results for input(s): HGBA1C in the last 72 hours. Urine analysis:    Component Value Date/Time   COLORURINE YELLOW 06/01/2024 1750   APPEARANCEUR CLEAR 06/01/2024 1750   APPEARANCEUR Clear 05/22/2022 0941   LABSPEC >1.046 (H) 06/01/2024 1750   PHURINE 6.0 06/01/2024 1750   GLUCOSEU NEGATIVE 06/01/2024 1750   HGBUR SMALL (A) 06/01/2024 1750   BILIRUBINUR NEGATIVE 06/01/2024 1750   BILIRUBINUR Negative 05/22/2022 0941   KETONESUR NEGATIVE 06/01/2024 1750   PROTEINUR 30 (A) 06/01/2024 1750   UROBILINOGEN negative 02/14/2015 1247   UROBILINOGEN 0.2 01/21/2015 0850   NITRITE NEGATIVE 06/01/2024  1750   LEUKOCYTESUR NEGATIVE 06/01/2024 1750   Sepsis Labs: @LABRCNTIP (procalcitonin:4,lacticidven:4) ) Recent Results (from the past 240 hours)  Urine Culture (for pregnant, neutropenic or urologic patients or patients with an indwelling urinary catheter)     Status: Abnormal   Collection Time: 06/01/24  5:50 PM   Specimen: Urine, Clean Catch  Result Value Ref Range Status   Specimen Description   Final    URINE, CLEAN CATCH Performed at [facility], [address] [place], [place] [address]    Special Requests   Final    Immunocompromised Performed at [facility], [address], [place], [place] [address]    Culture (A)  Final    <10,000 COLONIES/mL INSIGNIFICANT GROWTH Performed at [facility], [address] [place], [place] [address]    Report Status 06/02/2024 FINAL  Final  Culture, blood (Routine X 2) w Reflex to ID Panel     Status: None (Preliminary result)   Collection Time: 06/01/24  6:14 PM   Specimen: BLOOD  Result Value Ref Range Status   Specimen Description BLOOD RIGHT ANTECUBITAL  Final   Special Requests   Final    BOTTLES DRAWN AEROBIC ONLY Blood Culture adequate volume   Culture   Final    NO GROWTH 4 DAYS Performed at [facility], [address] [place], [place] [address]    Report Status PENDING  Incomplete  Culture, blood (Routine X 2) w Reflex to ID Panel     Status: None (Preliminary result)   Collection Time: 06/01/24  6:14 PM   Specimen: BLOOD  Result Value Ref Range Status   Specimen Description BLOOD RIGHT ANTECUBITAL  Final   Special Requests   Final    BOTTLES DRAWN AEROBIC AND ANAEROBIC Blood Culture results may not be optimal due to an inadequate volume of blood received in culture bottles   Culture   Final    NO GROWTH 4 DAYS Performed at [facility], [address], [place], [place] [address]    Report Status PENDING  Incomplete  MRSA Next Gen by PCR, Nasal     Status: None   Collection Time: 06/01/24  6:38 PM  Specimen: Nasal Mucosa; Nasal Swab  Result Value Ref Range Status   MRSA by PCR Next Gen NOT DETECTED NOT DETECTED Final    Comment: (NOTE) The GeneXpert MRSA Assay (FDA approved for NASAL specimens only), is one component of a comprehensive MRSA colonization surveillance program. It is not intended to diagnose MRSA infection nor to guide or monitor treatment for MRSA infections. Test performance is not FDA approved in patients less than 28 years old. Performed at [facility], [address], [place], [place] [address]      Scheduled Meds:  artificial tears  1 drop Both Eyes Q6H   Chlorhexidine  Gluconate Cloth  6 each Topical Q0600   metoprolol  tartrate  5 mg Intravenous Q6H   pantoprazole  (PROTONIX ) IV  40 mg Intravenous Q12H   tobramycin   1 drop Both Eyes Q6H   Continuous Infusions:  heparin  1,200 Units/hr (06/04/24 1536)   lactated ringers  50 mL/hr at 06/04/24 2157    Procedures/Studies: DG Abd 1 View Result Date: 06/04/2024 CLINICAL DATA:  Small bowel obstructionr. EXAM: ABDOMEN - 1 VIEW COMPARISON:  06/03/2024 FINDINGS: NG tube tip is identified in the stomach. Diffuse gaseous distention of small bowel persists with similar appearance. Gas-filled dilated small bowel in the low abdomen measures 5.2 cm today compared to 4.9 cm previously. Air in stool is seen in a nondilated transverse and left colon. Stones in the right upper quadrant compatible with gallstones as seen on CT 06/01/2024. Oral contrast material is visible in the stomach previously but is no longer present. No opacification of small bowel or colonic contents evident. Patient is status post bilateral hip replacement. IMPRESSION: Persistent diffuse gaseous distention of small bowel with similar appearance to prior. Imaging features remain compatible with small bowel obstruction. Electronically Signed   By: Camellia Candle M.D.   On: 06/04/2024 09:52   DG Abd Portable 1V-Small Bowel Obstruction Protocol-initial, 8 hr  delay Result Date: 06/03/2024 CLINICAL DATA:  SBO 8-hr delay image EXAM: PORTABLE ABDOMEN - 1 VIEW COMPARISON:  X-ray abdomen 06/03/2024 9:26 a.m. FINDINGS: Enteric tube with tip overlying the gastric lumen and side port overlying the expected region of the gastroesophageal junction. Persistent gaseous small bowel dilatation. PO contrast within the gastric lumen. Right upper quadrant cholelithiasis. Bilateral hip degenerative changes. IMPRESSION: 1. Small-bowel obstruction. 2. PO contrast within the gastric lumen. 3. Enteric tube with tip overlying the gastric lumen and side port overlying the expected region of the gastroesophageal junction. 4. Cholelithiasis. Electronically Signed   By: Morgane  Naveau M.D.   On: 06/03/2024 21:47   DG Abd Portable 1V-Small Bowel Protocol-Position Verification Result Date: 06/03/2024 CLINICAL DATA:  NG tube placement. EXAM: PORTABLE ABDOMEN - 1 VIEW COMPARISON:  Abdominal radiograph and CT 06/01/2024 FINDINGS: Tip of the weighted enteric tube in the stomach, the side port is likely below the diaphragm but not clearly defined on the current exam. Gaseous gastric distension. Gaseous small bowel distention up to 4.2 cm. Small amount of formed stool is in the colon. Right upper quadrant gallstones. IMPRESSION: 1. Tip of the weighted enteric tube in the stomach, the side port is likely below the diaphragm but not clearly defined on the current exam. 2. Gaseous gastric and small bowel distention, suspicious for obstruction. Electronically Signed   By: Andrea Gasman M.D.   On: 06/03/2024 10:23   DG Chest 1 View Result Date: 06/01/2024 CLINICAL DATA:  NG tube placement EXAM: CHEST  1 VIEW COMPARISON:  06/01/2024, 05/02/2024 FINDINGS: Sternotomy and cardiomegaly with strandy opacities at  the bases. Enteric tube tip below the diaphragm, side-port in the region of the stomach, tip is non included. Marked air distension of the stomach IMPRESSION: Enteric tube tip below the diaphragm,  side-port in the region of the stomach, tip is non included in the field of view. Marked air distension of the stomach. Electronically Signed   By: Luke Bun M.D.   On: 06/01/2024 20:16   DG Abdomen 1 View Result Date: 06/01/2024 CLINICAL DATA:  NG tube placement- had to put new one in due to getting pulled out. EXAM: ABDOMEN - 1 VIEW COMPARISON:  None Available. FINDINGS: The bowel gas pattern is non-obstructive. No evidence of pneumoperitoneum, within the limitations of a supine film. No acute osseous abnormalities. The soft tissues are within normal limits. Surgical changes, devices, tubes and lines: Interval placement of nasogastric tube with its tip overlying the fundus of the stomach region. However, side hole is just above the left hemidiaphragm in the GE junction region. Recommend further advancement by 2-3 inches to confidently put the side-hole into the stomach. IMPRESSION: *Interval placement of nasogastric tube with its tip overlying the fundus of the stomach region. However, side hole is just above the left hemidiaphragm in the GE junction region. Recommend further advancement by 2-3 inches to confidently put the side-hole into the stomach. Electronically Signed   By: Ree Molt M.D.   On: 06/01/2024 17:14   ECHOCARDIOGRAM COMPLETE Result Date: 06/01/2024    ECHOCARDIOGRAM REPORT   Patient Name:   [name] Date of Exam: 06/01/2024 Medical Rec #:  [id number]    Height:       66.0 in Accession #:    [id number]   Weight:       172.0 lb Date of Birth:  09/02/1932   BSA:          1.876 m Patient Age:    89 years     BP:           126/85 mmHg Patient Gender: M            HR:           107 bpm. Exam Location:  [facility] Procedure: 2D Echo, Cardiac Doppler and Color Doppler (Both Spectral and Color            Flow Doppler were utilized during procedure). Indications:    A-fib  History:        Patient has no prior history of Echocardiogram examinations.                 Arrythmias:Atrial  Fibrillation; Risk Factors:Hypertension.  Sonographer:    Vella Key Referring Phys: Clarissa.Climes Halina Asano IMPRESSIONS  1. Challenging study in the setting of tachycardia, HR 120-130s.  2. Left ventricular ejection fraction, by estimation, is 30 to 35%. The left ventricle has moderately decreased function. Left ventricular endocardial border not optimally defined to evaluate regional wall motion. Left ventricular diastolic function could not be evaluated.  3. Right ventricular systolic function was not well visualized. The right ventricular size is moderately enlarged. There is normal pulmonary artery systolic pressure.  4. Left atrial size was severely dilated.  5. Right atrial size was severely dilated.  6. The mitral valve is grossly normal. Mild mitral valve regurgitation. No evidence of mitral stenosis.  7. The aortic valve was not well visualized. Aortic valve regurgitation is not visualized. No aortic stenosis is present.  8. Aortic dilatation noted. There is mild dilatation of the aortic root, measuring  40 mm.  9. The inferior vena cava is dilated in size with >50% respiratory variability, suggesting right atrial pressure of 8 mmHg. Comparison(s): No prior Echocardiogram. FINDINGS  Left Ventricle: Left ventricular ejection fraction, by estimation, is 30 to 35%. The left ventricle has moderately decreased function. Left ventricular endocardial border not optimally defined to evaluate regional wall motion. Strain was performed and the global longitudinal strain is indeterminate. The left ventricular internal cavity size was normal in size. There is no left ventricular hypertrophy. Left ventricular diastolic function could not be evaluated due to atrial fibrillation. Left ventricular diastolic function could not be evaluated. Right Ventricle: The right ventricular size is moderately enlarged. No increase in right ventricular wall thickness. Right ventricular systolic function was not well visualized. There is normal  pulmonary artery systolic pressure. The tricuspid regurgitant velocity is 2.60 m/s, and with an assumed right atrial pressure of 8 mmHg, the estimated right ventricular systolic pressure is 35.0 mmHg. Left Atrium: Left atrial size was severely dilated. Right Atrium: Right atrial size was severely dilated. Pericardium: There is no evidence of pericardial effusion. Mitral Valve: The mitral valve is grossly normal. Mild mitral valve regurgitation. No evidence of mitral valve stenosis. Tricuspid Valve: The tricuspid valve is normal in structure. Tricuspid valve regurgitation is mild . No evidence of tricuspid stenosis. Aortic Valve: The aortic valve was not well visualized. Aortic valve regurgitation is not visualized. No aortic stenosis is present. Pulmonic Valve: The pulmonic valve was not well visualized. Pulmonic valve regurgitation is not visualized. No evidence of pulmonic stenosis. Aorta: Aortic dilatation noted. There is mild dilatation of the aortic root, measuring 40 mm. Venous: The inferior vena cava is dilated in size with greater than 50% respiratory variability, suggesting right atrial pressure of 8 mmHg. IAS/Shunts: No atrial level shunt detected by color flow Doppler. Additional Comments: 3D was performed not requiring image post processing on an independent workstation and was indeterminate.  LEFT VENTRICLE PLAX 2D LVIDd:         4.80 cm      Diastology LVIDs:         4.20 cm      LV e' medial:    4.35 cm/s LV PW:         1.20 cm      LV E/e' medial:  26.9 LV IVS:        1.10 cm      LV e' lateral:   3.92 cm/s LVOT diam:     1.70 cm      LV E/e' lateral: 29.8 LV SV:         25 LV SV Index:   13 LVOT Area:     2.27 cm  LV Volumes (MOD) LV vol d, MOD A2C: 111.0 ml LV vol d, MOD A4C: 96.9 ml LV vol s, MOD A2C: 58.0 ml LV vol s, MOD A4C: 51.7 ml LV SV MOD A2C:     53.0 ml LV SV MOD A4C:     96.9 ml LV SV MOD BP:      51.5 ml RIGHT VENTRICLE RV Basal diam:  4.60 cm RV S prime:     8.49 cm/s TAPSE (M-mode):  1.2 cm LEFT ATRIUM              Index        RIGHT ATRIUM           Index LA diam:        6.40 cm  3.41 cm/m  RA Area:     26.50 cm LA Vol (A2C):   111.0 ml 59.18 ml/m  RA Volume:   87.50 ml  46.65 ml/m LA Vol (A4C):   108.0 ml 57.58 ml/m LA Biplane Vol: 120.0 ml 63.98 ml/m  AORTIC VALVE LVOT Vmax:   64.60 cm/s LVOT Vmean:  48.900 cm/s LVOT VTI:    0.110 m  AORTA Ao Root diam: 4.00 cm MITRAL VALVE                TRICUSPID VALVE MV Area (PHT): 12.64 cm    TV Peak grad:   27.0 mmHg MV Decel Time: 60 msec      TV Vmax:        2.60 m/s MV E velocity: 117.00 cm/s  TR Peak grad:   27.0 mmHg MV A velocity: 50.70 cm/s   TR Vmax:        260.00 cm/s MV E/A ratio:  2.31                             SHUNTS                             Systemic VTI:  0.11 m                             Systemic Diam: 1.70 cm Vishnu Priya Mallipeddi Electronically signed by Diannah Late Mallipeddi Signature Date/Time: 06/01/2024/4:13:53 PM    Final    DG Abdomen 1 View Result Date: 06/01/2024 CLINICAL DATA:  NG tube placement EXAM: ABDOMEN - 1 VIEW COMPARISON:  06/01/2024 FINDINGS: Weighted feeding tube terminates in the distended stomach. Redemonstrated findings of small-bowel obstruction. No pneumoperitoneum. No organomegaly or radiopaque calculi. Osteopenia. Bilateral hip arthroplasties. Cardiomegaly. Sternotomy wires and CABG markers. IMPRESSION: Weighted feeding tube terminates in the distended stomach. Electronically Signed   By: Rogelia Myers M.D.   On: 06/01/2024 13:54   CT ABDOMEN PELVIS W CONTRAST Result Date: 06/01/2024 CLINICAL DATA:  Abdominal pain, acute nonlocalized.  Vomiting blood. EXAM: CT ABDOMEN AND PELVIS WITH CONTRAST TECHNIQUE: Multidetector CT imaging of the abdomen and pelvis was performed using the standard protocol following bolus administration of intravenous contrast. RADIATION DOSE REDUCTION: This exam was performed according to the departmental dose-optimization program which includes automated exposure  control, adjustment of the mA and/or kV according to patient size and/or use of iterative reconstruction technique. CONTRAST:  OMNIPAQUE  IOHEXOL  300 MG/ML  SOLN COMPARISON:  CT chest 05/02/2024 FINDINGS: Lower chest: Chronic scarring at the lung bases. Known adenopathy in the right hilum. See results of previous chest CT. Hepatobiliary: Cirrhosis of the liver with enlarged left lobe and nodular surface of the liver. No sign of liver mass. Multiple calcified gallstones without CT evidence of cholecystitis or obstruction. Pancreas: Normal Spleen: Normal Adrenals/Urinary Tract: Adrenal glands are normal. No renal obstruction or worrisome lesion. Simple appearing cysts. Bladder poorly seen because of artifact from hip replacements. Stomach/Bowel: Fluid distension of the stomach and small intestine to the midportion of the small intestine, with relative collapse of the small intestine distal to that. Findings consistent with high-grade small bowel obstruction. Specific etiology not specifically elucidated. Ordinary diverticulosis of the left colon without evidence of diverticulitis. Vascular/Lymphatic: Aortic atherosclerosis. No aneurysm. IVC is normal. No adenopathy. Reproductive: Normal other than enlarged prostate. Other: No free fluid or air. Musculoskeletal: subacute compression  fracture of T12 with loss of height of 50%. No gross retropulsion. This was present in May. IMPRESSION: 1. High-grade small bowel obstruction with fluid distension of the stomach and small intestine to the midportion of the small intestine, with relative collapse of the small intestine distal to that. Specific etiology not elucidated. 2. Cirrhosis of the liver. 3. Cholelithiasis without CT evidence of cholecystitis or obstruction. 4. Aortic atherosclerosis. 5. Subacute compression fracture of T12 with loss of height of 50%. No gross retropulsion. This was present in May. 6. Known adenopathy in the right hilum. See results of previous  chest CT. Aortic Atherosclerosis (ICD10-I70.0). Electronically Signed   By: Oneil Officer M.D.   On: 06/01/2024 11:33   DG Chest Port 1 View Result Date: 06/01/2024 EXAM: 1 VIEW XRAY OF THE CHEST 06/01/2024 10:10:00 AM COMPARISON: AP radiograph of the chest dated 06/09/2022. CLINICAL HISTORY: [id number] GI bleed [id number]. Pt bib ems from home with reports of vomiting blood. Family reports it looked like coffee ground emesis. Onset yesterday afternoon. Abdomen hard and distended which is new per family. Last chemo 2 days ago. Complaining of chest pain, reports that is ; where his cancer is. Afib on monitor. Given 4mg  zofran  en route. ; 120/60; HR 100-130; 93% RA. Hx of afib, [name], hypertension, CABG. Former smoker FINDINGS: LUNGS AND PLEURA: Coarse reticular opacities present within the mid and lower lung zones, which appear chronic. No focal pulmonary opacity. No pulmonary edema. No pleural effusion. No pneumothorax. HEART AND MEDIASTINUM: The heart is enlarged. The patient is status post CABG. No acute abnormality of the cardiac and mediastinal silhouettes. BONES AND SOFT TISSUES: No acute osseous abnormality. A left subclavian chest border is present as before. IMPRESSION: 1. No acute findings. 2. Enlarged heart and status post CABG. 3. Chronic coarse reticular opacities in the mid and lower lung zones. Electronically signed by: Evalene Coho MD 06/01/2024 10:30 AM EDT RP Workstation: DARYLENE Alm Schneider, DO  [facility]  If 7PM-7AM, please contact night-coverage [email or web address] Password [facility] 06/05/2024, 7:33 AM   LOS: 4 days

## 2024-06-05 NOTE — Progress Notes (Signed)
 Patient with complaints of nausea and feeling like NG tube was not in the right spot. Dr. Mavis and Dr. Evonnie notified. Verbal order from Dr. Mavis to remove 14 Fr NG tube that patient currently has in and replace with a 16 Fr NG tube. 16 Fr NG tube placed without difficulty into Left Nare. Patient with no complaints currently. Abdominal xray ordered to confirm placement. Awaiting results.

## 2024-06-05 NOTE — Progress Notes (Signed)
 [facility]. Report given to [facility]. Patient's wife and daughter made aware of patient leaving by [facility].

## 2024-06-05 NOTE — Plan of Care (Signed)

## 2024-06-06 ENCOUNTER — Inpatient Hospital Stay (HOSPITAL_COMMUNITY)

## 2024-06-06 DIAGNOSIS — Z515 Encounter for palliative care: Secondary | ICD-10-CM | POA: Diagnosis not present

## 2024-06-06 DIAGNOSIS — Z66 Do not resuscitate: Secondary | ICD-10-CM

## 2024-06-06 DIAGNOSIS — K56609 Unspecified intestinal obstruction, unspecified as to partial versus complete obstruction: Secondary | ICD-10-CM | POA: Diagnosis not present

## 2024-06-06 DIAGNOSIS — I2510 Atherosclerotic heart disease of native coronary artery without angina pectoris: Secondary | ICD-10-CM | POA: Diagnosis not present

## 2024-06-06 DIAGNOSIS — E440 Moderate protein-calorie malnutrition: Secondary | ICD-10-CM | POA: Insufficient documentation

## 2024-06-06 DIAGNOSIS — I5022 Chronic systolic (congestive) heart failure: Secondary | ICD-10-CM | POA: Diagnosis not present

## 2024-06-06 DIAGNOSIS — Z7189 Other specified counseling: Secondary | ICD-10-CM | POA: Diagnosis not present

## 2024-06-06 DIAGNOSIS — I4821 Permanent atrial fibrillation: Secondary | ICD-10-CM | POA: Diagnosis not present

## 2024-06-06 LAB — CBC
HCT: 36.4 % — ABNORMAL LOW (ref 39.0–52.0)
Hemoglobin: 12 g/dL — ABNORMAL LOW (ref 13.0–17.0)
MCH: 33.7 pg (ref 26.0–34.0)
MCHC: 33 g/dL (ref 30.0–36.0)
MCV: 102.2 fL — ABNORMAL HIGH (ref 80.0–100.0)
Platelets: 181 K/uL (ref 150–400)
RBC: 3.56 MIL/uL — ABNORMAL LOW (ref 4.22–5.81)
RDW: 13.8 % (ref 11.5–15.5)
WBC: 24.6 K/uL — ABNORMAL HIGH (ref 4.0–10.5)
nRBC: 0 % (ref 0.0–0.2)

## 2024-06-06 LAB — HEPARIN LEVEL (UNFRACTIONATED): Heparin Unfractionated: 0.42 [IU]/mL (ref 0.30–0.70)

## 2024-06-06 LAB — BASIC METABOLIC PANEL WITH GFR
Anion gap: 13 (ref 5–15)
BUN: 34 mg/dL — ABNORMAL HIGH (ref 8–23)
CO2: 22 mmol/L (ref 22–32)
Calcium: 8.8 mg/dL — ABNORMAL LOW (ref 8.9–10.3)
Chloride: 108 mmol/L (ref 98–111)
Creatinine, Ser: 1.05 mg/dL (ref 0.61–1.24)
GFR, Estimated: >60 mL/min (ref >60)
Glucose, Bld: 77 mg/dL (ref 70–99)
Potassium: 4.4 mmol/L (ref 3.5–5.1)
Sodium: 143 mmol/L (ref 135–145)

## 2024-06-06 LAB — PROTIME-INR
INR: 1.2 (ref 0.8–1.2)
Prothrombin Time: 15.4 s — ABNORMAL HIGH (ref 11.4–15.2)

## 2024-06-06 LAB — GLUCOSE, CAPILLARY
Glucose-Capillary: 92 mg/dL (ref 70–99)
Glucose-Capillary: 88 mg/dL (ref 70–99)
Glucose-Capillary: 88 mg/dL (ref 70–99)
Glucose-Capillary: 85 mg/dL (ref 70–99)
Glucose-Capillary: 108 mg/dL — ABNORMAL HIGH (ref 70–99)

## 2024-06-06 LAB — PHOSPHORUS: Phosphorus: 3.7 mg/dL (ref 2.5–4.6)

## 2024-06-06 LAB — MAGNESIUM: Magnesium: 1.9 mg/dL (ref 1.7–2.4)

## 2024-06-06 MED ORDER — THIAMINE HCL 100 MG/ML IJ SOLN
100.0000 mg | Freq: Once | INTRAMUSCULAR | Status: DC
Start: 2024-06-06 — End: 2024-06-06

## 2024-06-06 MED ORDER — SODIUM CHLORIDE 0.9% FLUSH
10.0000 mL | Freq: Two times a day (BID) | INTRAVENOUS | Status: DC
  Administered 2024-06-06 – 2024-06-07 (×2): 10 mL
  Administered 2024-06-07: 20 mL
  Administered 2024-06-08 – 2024-06-10 (×5): 10 mL

## 2024-06-06 MED ORDER — TRACE MINERALS CU-MN-SE-ZN 300-55-60-3000 MCG/ML IV SOLN
INTRAVENOUS | Status: AC
  Filled 2024-06-06: qty 358.4

## 2024-06-06 MED ORDER — SODIUM CHLORIDE 0.9% FLUSH
10.0000 mL | INTRAVENOUS | Status: DC | PRN
  Administered 2024-06-06 – 2024-06-07 (×2): 10 mL

## 2024-06-06 MED ORDER — TRACE MINERALS CU-MN-SE-ZN 300-55-60-3000 MCG/ML IV SOLN: INTRAVENOUS | Status: DC

## 2024-06-06 MED ORDER — THIAMINE HCL 100 MG/ML IJ SOLN
100.0000 mg | Freq: Once | INTRAMUSCULAR | Status: AC
  Administered 2024-06-06: 100 mg via INTRAVENOUS
  Filled 2024-06-06: qty 2

## 2024-06-06 MED ORDER — METOPROLOL TARTRATE 5 MG/5ML IV SOLN
2.5000 mg | INTRAVENOUS | Status: AC | PRN
  Administered 2024-06-06 (×2): 2.5 mg via INTRAVENOUS
  Filled 2024-06-06 (×2): qty 5

## 2024-06-06 MED ORDER — METOPROLOL TARTRATE 5 MG/5ML IV SOLN
7.5000 mg | INTRAVENOUS | Status: DC
  Administered 2024-06-06 – 2024-06-09 (×17): 7.5 mg via INTRAVENOUS
  Filled 2024-06-06 (×18): qty 10

## 2024-06-06 MED ORDER — THIAMINE HCL 100 MG/ML IJ SOLN: 100.0000 mg | INTRAMUSCULAR | Status: DC

## 2024-06-06 MED ORDER — THIAMINE HCL 100 MG/ML IJ SOLN
100.0000 mg | INTRAMUSCULAR | Status: DC
  Administered 2024-06-07 – 2024-06-09 (×3): 100 mg via INTRAVENOUS
  Filled 2024-06-06 (×4): qty 2

## 2024-06-06 MED ORDER — FENTANYL CITRATE PF 50 MCG/ML IJ SOSY
50.0000 ug | PREFILLED_SYRINGE | INTRAMUSCULAR | Status: DC | PRN
  Administered 2024-06-06: 50 ug via INTRAVENOUS
  Filled 2024-06-06: qty 1

## 2024-06-06 MED ORDER — DIATRIZOATE MEGLUMINE & SODIUM 66-10 % PO SOLN
90.0000 mL | Freq: Once | ORAL | Status: AC
  Administered 2024-06-06: 90 mL via NASOGASTRIC
  Filled 2024-06-06: qty 90

## 2024-06-06 MED ORDER — MAGNESIUM SULFATE 2 GM/50ML IV SOLN
2.0000 g | Freq: Once | INTRAVENOUS | Status: AC
  Administered 2024-06-06: 2 g via INTRAVENOUS
  Filled 2024-06-06: qty 50

## 2024-06-06 MED ORDER — INSULIN ASPART 100 UNIT/ML IJ SOLN
0.0000 [IU] | INTRAMUSCULAR | Status: DC
  Administered 2024-06-07 (×2): 1 [IU] via SUBCUTANEOUS

## 2024-06-06 NOTE — Consult Note (Signed)
 PHARMACY - ANTICOAGULATION CONSULT NOTE  Pharmacy Consult for heparin  infusion Indication: atrial fibrillation  Allergies  Allergen Reactions   Penicillins Hives and Rash    Has patient had a PCN reaction causing immediate rash, facial/tongue/throat swelling, SOB or lightheadedness with hypotension: Yes Has patient had a PCN reaction causing severe rash involving mucus membranes or skin necrosis: Yes Has patient had a PCN reaction that required hospitalization: No Has patient had a PCN reaction occurring within the last 10 years: No If all of the above answers are NO, then may proceed with Cephalosporin use.    Cetuximab  Cough    Cough and scratchy throat. Drug rechallenged and pt able to tolerated the rest of the infusion without any complications.    Hct [Hydrochlorothiazide] Other (See Comments)    hyper   Statins Other (See Comments)    Myalgia, tolerates low dosages of lovastatin      Patient Measurements: Height: 5' 6 (167.6 cm) Weight: 80.1 kg (176 lb 9.4 oz) IBW/kg (Calculated) : 63.8 HEPARIN  DW (KG): 79.9  Vital Signs: Temp: 98 F (36.7 C) (08/26 0809) Temp Source: Axillary (08/26 0809) BP: 140/83 (08/26 0809) Pulse Rate: 79 (08/26 0636)  Labs: Recent Labs     0000 06/03/24 2013 06/04/24 0256 06/04/24 0533 06/04/24 9161 06/04/24 1312 06/05/24 0027 06/05/24 0534 06/05/24 0820 06/06/24 0541  HGB   < >  --  12.2*  --   --   --   --  11.8*  --  12.0*  HCT  --   --  36.8*  --   --   --   --  35.9*  --  36.4*  PLT  --   --  197  --   --   --   --  177  --  181  APTT  --  82*  --   --   --   --   --   --   --   --   LABPROT  --   --  19.8*  --   --   --  15.7*  --   --  15.4*  INR  --   --  1.6*  --   --   --  1.2  --   --  1.2  HEPARINUNFRC  --   --  >1.10*   < >  --    < > 0.41 >1.10* 0.39 0.42  CREATININE  --   --   --   --  0.93  --  0.90  --   --  1.05   < > = values in this interval not displayed.    Estimated Creatinine Clearance: 47.4 mL/min (by  C-G formula based on SCr of 1.05 mg/dL).   Medical History: Past Medical History:  Diagnosis Date   Anemia    Atrial fibrillation (HCC)    Cataract    Cellulitis    In past   Complication of anesthesia    HARD TIME WAKING; CAUSES MY BP TO GO UP    Coronary artery disease    5 bypasses   DJD (degenerative joint disease)    Ejection fraction < 50%    Mildly reduced, 40% by echo   GERD (gastroesophageal reflux disease)    Hyperlipidemia    Hypertension    Persistent atrial fibrillation (HCC)    Prostate hypertrophy    on CT scan 09/2014   PUD (peptic ulcer disease)    RESOLVED    Skin cancer of eyelid  Resected    Medications:  Patient on Warfarin 5 mg Sun/Wed and 2.5 mg AOD, currently on hold for possible procedure  Assessment: 89 yo male presented to ED on 8/21 with complaint of abdominal pain, nausea, and vomiting.  Patient admitted for treatment of small bowel obstruction.  Patient anticoagulated with Warfarin for Afib, this is currently being held.  Pharmacy consulted to initiate heparin  infusion.  HL 0.42- therapeutic CBC WNL INR 1.2  Goal of Therapy:  Heparin  level 0.3-0.7 units/ml Monitor platelets by anticoagulation protocol: Yes   Plan:  Cont heparin  1350 units/hr Heparin  level with AM labs  Keyerra Lamere A. Lyle, PharmD, BCPS, FNKF Clinical Pharmacist [place] Please utilize Amion for appropriate phone number to reach the unit pharmacist [facility])

## 2024-06-06 NOTE — Progress Notes (Signed)
 Initial Nutrition Assessment  DOCUMENTATION CODES:   Non-severe (moderate) malnutrition in context of chronic illness  INTERVENTION:  Parenteral Nutrition - titrate to meet estimated nutrition needs.  Kcal: 1800-2000 Protein: 95-115g Fluid Restriction (per MD): 1867ml/day  Standard MVI w/ trace elements added Add thiamine  100mg  x5 days for refeeding  Monitor magnesium , potassium, and phosphorus daily for at least 3 days, MD to replete as needed  Monitor for diet advancement/tolerance    Upon diet advancement, transition to oral MVI  Daily weights on TPN   NUTRITION DIAGNOSIS:  Moderate Malnutrition related to chronic illness as evidenced by mild fat depletion, moderate muscle depletion, severe muscle depletion, percent weight loss.  GOAL:  Patient will meet greater than or equal to 90% of their needs  MONITOR:  Diet advancement, TF tolerance, Weight trends, PO intake  REASON FOR ASSESSMENT:   Consult New TPN/TNA  ASSESSMENT:   Pt with PMH significant for: [name], HTN, HLD, afib, SCC of neck, and CLL. Presented to [facility] with N/V x2 days as well as abdominal pain. Found to have SBO and transferred to [facility].   Prior to Admission 8/19 received carboplatin  infusion for cancer 8/20 patient received pegfilgrastim ; last BM Admission 8/21 admitted to [facility]; Echo: EF 30-35% 8/23 SB study - SBO persists 8/25 NGT placed to suction; txr to [facility] for SBO management by surgery  Patient resting in bed with wife and daughter at bedside. NGT in place with dark brown, bilious output in cannister. Evidence of cirrhosis on admission.   24 Hour Recall B: toast w/ peanut butter and jelly w/ black coffee L: vegetable soup OR meatloaf, green beans, potatoes w/ Evlyn Gu D: cereal or toast w/ jelly or peanut butter crackers w/ [facility]  Patient and wife report stable appetite/intake prior to admission, but that he has been NPO for six days now. He is at high risk for refeeding. Thiamine  to  be added to TPN as well as MVI w/ trace elements. No difficulties chewing or swallowing at baseline. He is also independent with ADLs at baseline.  Discussed indications for TPN and recommendations when diet advanced. Patient and his wife report he is not amicable to any ONS as they are too sweet and he cannot tolerate. Discussed the need for increased protein and calorie intake when diet advanced if supplements are not preferred to augment intake. They both verbalize understanding.   Admit Weight: 80.1 kg Current Weight: 71.8 kg  UBW endorsed as 180lbs. He last weighed this three weeks ago, per his report. Current weight is 157lbs. This is 12.8% weight loss. If accurate, this is considered clinically significant for the time frame. Will continue to monitor trend as daily weights are collected. Per chart review, he has lost 7.4% of his body weight in one month, which is also considered clinically significant for the time frame. No significant edema on exam . Improved from mild, pitting edema documented yesterday. Abdomen remains taught and distended, but patient reports improvement.  Drains/Lines: NGT (16Fr): 2.1L x24 hours Left chest: implanted port  UOP: 450 ml x24 hours  Meds: SSI Novolog  0-6 q4h, pantoprazole   Labs:  Na+ 143 (wdl) K+ 4.4 (wdl) WBC 43.2>25.9>24.6 (H) CBGs 77-109 x24 hours    NUTRITION - FOCUSED PHYSICAL EXAM:  Patient does meet criteria for non-severe malnutrition in the context of chronic illness due to muscle and fat depletions as well as recent weight trends. He is at risk for declining into severe malnutrition given the nature of his chronic illness and  comorbidities.  Flowsheet Row Most Recent Value  Orbital Region Mild depletion  Upper Arm Region Moderate depletion  Thoracic and Lumbar Region Unable to assess  [abdominal distention]  Buccal Region Mild depletion  Temple Region Mild depletion  Clavicle Bone Region Moderate depletion  Clavicle and Acromion  Bone Region Mild depletion  Scapular Bone Region Mild depletion  Dorsal Hand No depletion  Patellar Region Moderate depletion  Anterior Thigh Region Moderate depletion  Posterior Calf Region Moderate depletion  Edema (RD Assessment) Mild  Hair Reviewed  Eyes Reviewed  Mouth Reviewed  Skin Reviewed  Nails Reviewed     Diet Order:   Diet Order             Diet NPO time specified  Diet effective now             EDUCATION NEEDS:  Education needs have been addressed  Skin:  Skin Assessment: Reviewed RN Assessment  Last BM:  PTA 8/20  Height:  Ht Readings from Last 1 Encounters:  06/01/24 5' 6 (1.676 m)   Weight:  Wt Readings from Last 1 Encounters:  06/06/24 71.8 kg   Ideal Body Weight:  64.5 kg  BMI:  Body mass index is 25.55 kg/m.  Estimated Nutritional Needs:   Kcal:  1800-2000 kcals  Protein:  95-115g  Fluid:  1.8-2L/day  Blair Deaner MS, RD, LDN Registered Dietitian Clinical Nutrition RD Inpatient Contact Info in Amion

## 2024-06-06 NOTE — Care Management Important Message (Signed)
 Important Message  Patient Details  Name: [name] MRN: [id number] Date of Birth: [birth year]   Important Message Given:  Yes - Medicare IM     Claretta Deed 06/06/2024, 4:10 PM

## 2024-06-06 NOTE — Progress Notes (Signed)
 Patient heart rate sustaining 120-130's in atrial fibrillation. Vital signs Temp. 98.2 HR 120-130's RR 20 B/P 158/90 oxygen  saturation 94% on room air. Dr. Lenon notified . Order received for IV metoprolol  and given. Will continue with current plan of care.

## 2024-06-06 NOTE — Consult Note (Signed)
 Palliative Medicine Inpatient Consult Note  Consulting Provider: Dr. Paola  Reason for consult:   Palliative Care Consult Services Palliative Medicine Consult  Reason for Consult? goc, high surgical risk   06/06/2024  HPI:  Per intake H&P --> [name] is a 89 y.o. male with a PMH significant for HTN, HLD, Chronic HFrEF with EF 30-35% on 06/01/24, A. 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 Patient without improvement over the past five days despite SBO protocol. He is a high surgical risk for surgical intervention which is being considered therefore the PMT has been asled to assist in GOC conversation(s).   Clinical Assessment/Goals of Care:  *Please note that this is a verbal dictation therefore any spelling or grammatical errors are due to the Dragon Medical One system interpretation.  I have reviewed medical records including EPIC notes, labs and imaging, received report from bedside RN, assessed the patient who is lying in bed in NAD.    I met with [name], his wife, [name], and his daughter, [name] to further discuss diagnosis prognosis, GOC, EOL wishes, disposition and options.   I introduced Palliative Medicine as specialized medical care for people living with serious illness. It focuses on providing relief from the symptoms and stress of a serious illness. The goal is to improve quality of life for both the patient and the family.  Medical History Review and Understanding:  A review of [name]'s past medical history inclusive of heart failure, atrial fibrillation, coronary artery disease status post CABG, metastatic squamous cell carcinoma of his head and neck, CLL, chronic kidney disease, anemia, GERD, DJD, and hypertension was  completed  Social History:  Wayne lives in [place] .  He and his wife have been married for 70 years and share 1 daughter, [name].  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  He is a man of [place] faith practicing within the [facility] denomination.  He shares that he was able to provide a wonderful life for his wife and they used to enjoy going to a place they had on the beach as well as traveling frequently.  Functional and Nutritional State:  [name] was able to function well preceding hospitalization and attend to all B ADLs.  He did have a good appetite.  Advance Directives:  A detailed discussion was had today regarding advanced directives.  Patient shares he has advanced directives for financial though not for health.  I provided his family a blank copy of the documents though by default his wife, [name] would be her surrogate decision-maker.  Code Status:  Concepts specific to code status, artifical feeding and hydration, continued IV antibiotics and rehospitalization was had.  The difference between a aggressive medical intervention path  and a palliative comfort care path for this patient at this time was had.   Encouraged patient/family to consider DNR/DNI status understanding evidenced based poor outcomes in similar hospitalized patient, as the cause of arrest is likely associated with advanced chronic/terminal illness rather than an easily reversible acute cardio-pulmonary event. I explained that DNR/DNI does not change the medical plan and it only comes into effect after a person has arrested (died).  It is a protective measure to keep us  from harming the patient in their last moments of life.  [place]  was agreeable to DNR/DNI with understanding that patient would not receive CPR, defibrillation, ACLS medications, or intubation.   Discussion:  We discussed the circumstances leading to [name]'s hospital admission.  He shares that his last bowel movement was  last Tuesday.  He had gone on Wednesday to have chemotherapy and it was the next day that he was not feeling well prompting hospitalization.  [name] and his family share since hospitalization he has been getting conservative measures to resolve his small bowel obstruction though to date there has been no relief or improvement.  [name] and his family did meet with the surgical team today and discussed both the risks as well as the benefits of potential surgical intervention.  He understands very well that he is a high risk for possible poor surgical outcomes though despite this wants some relief of his ongoing abdominal discomfort.  We reviewed and discussed best case and worst-case scenarios best case being surgery goes well the obstructive processes identified and resolved then he continues along with rehabilitation.  We reviewed the worst case scenario whereby surgery does not go well and is plagued with postsurgical complications leading to a decreased quality of life.  Patient's wife and patient agree as it is his decision to make.  As of presently he is along the lines of getting surgery should it be warranted.  We did talk about if things do go poorly that [name] is lived a wonderful life and accomplished everything he is wanted to during his time on earth.  This conversation was regionally emotional for his family though they were able to vocalize that any decisions moving forward or his to make and they will honor them.  Discussed the importance of continued conversation with family and their  medical providers regarding overall plan of care and treatment options, ensuring decisions are within the context of the patients values and GOCs.  Decision Maker: Kendrix,Shirley (Spouse): [phone] (Home Phone)   SUMMARY OF RECOMMENDATIONS   DNAR/DNI  Open and honest conversations held with [name] and his family in the setting of his ongoing surgical risk  Best case and worst-case scenarios  reviewed  Ultimately, patient aware of the decision to proceed with surgery is his and he is veering on the side of wanting to pursue this should it be warranted to offer him some relief  Ongoing palliative care support  Code Status/Advance Care Planning: DNAR/DNI  Palliative Prophylaxis:  Aspiration, Bowel Regimen, Delirium Protocol, Frequent Pain Assessment, Oral Care, Palliative Wound Care, and Turn Reposition  Additional Recommendations (Limitations, Scope, Preferences): Continued support  Psycho-social/Spiritual:  Desire for further Chaplaincy support: Yes patient has [facility] Additional Recommendations: Education on surgical intervention   Prognosis: Elevated chronic disease burden with acute small bowel obstruction and decisions impingement upon whether or not to pursue surgery.  Discharge Planning: Discharge plan to be determined.  Vitals:   06/06/24 1138 06/06/24 1230  BP:  135/78  Pulse:  92  Resp: 20 20  Temp: 97.6 F (36.4 C)   SpO2:  96%    Intake/Output Summary (Last 24 hours) at 06/06/2024 1312 Last data filed at 06/06/2024 0300 Gross per 24 hour  Intake --  Output 2450 ml  Net -2450 ml   Last Weight  Most recent update: 06/01/2024  7:03 PM    Weight  80.1 kg (176 lb 9.4 oz)            LABS: CBC:    Component Value Date/Time   WBC 24.6 (H) 06/06/2024 0541  HGB 12.0 (L) 06/06/2024 0541   HGB 14.0 03/04/2023 1017   HCT 36.4 (L) 06/06/2024 0541   HCT 41.7 03/04/2023 1017   PLT 181 06/06/2024 0541   PLT 209 03/04/2023 1017   MCV 102.2 (H) 06/06/2024 0541   MCV 99 (H) 03/04/2023 1017   NEUTROABS 43.9 (H) 06/03/2024 0500   NEUTROABS 5.5 03/04/2023 1017   LYMPHSABS 3.7 06/03/2024 0500   LYMPHSABS 5.4 (H) 03/04/2023 1017   MONOABS 1.0 06/03/2024 0500   EOSABS 0.0 06/03/2024 0500   EOSABS 1.2 (H) 03/04/2023 1017   BASOSABS 0.0 06/03/2024 0500   BASOSABS 0.1 03/04/2023 1017   Comprehensive Metabolic Panel:    Component Value Date/Time   NA  143 06/06/2024 0541   NA 142 05/22/2022 0955   K 4.4 06/06/2024 0541   CL 108 06/06/2024 0541   CO2 22 06/06/2024 0541   BUN 34 (H) 06/06/2024 0541   BUN 13 05/22/2022 0955   CREATININE 1.05 06/06/2024 0541   CREATININE 1.10 05/08/2013 0938   GLUCOSE 77 06/06/2024 0541   CALCIUM  8.8 (L) 06/06/2024 0541   AST 27 06/02/2024 0252   ALT 21 06/02/2024 0252   ALKPHOS 74 06/02/2024 0252   BILITOT 0.8 06/02/2024 0252   BILITOT 0.6 05/22/2022 0955   PROT 5.1 (L) 06/02/2024 0252   PROT 7.0 05/22/2022 0955   ALBUMIN 2.7 (L) 06/02/2024 0252   ALBUMIN 4.2 05/22/2022 0955   Gen: Elderly Caucasian male chronically ill in appearance HEENT: Nasogastric tube in place, moist mucous membranes CV: Irregular rate and rhythm PULM: On room air breathing is even and unlabored ABD: soft/nontender EXT: No edema Neuro: Alert and oriented x3  PPS: 30%   This conversation/these recommendations were discussed with patient primary care team, Dr. Paola ______________________________________________________ Rosaline Becton [facility] Palliative Medicine Team Team Cell Phone: [phone] Please utilize secure chat with additional questions, if there is no response within 30 minutes please call the above phone number  Total Time: 75 Billing based on MDM: High  Palliative Medicine Team providers are available by phone from 7am to 7pm daily and can be reached through the team cell phone.  Should this patient require assistance outside of these hours, please call the patient's attending physician.

## 2024-06-06 NOTE — Progress Notes (Signed)
 PROGRESS NOTE  [name] FMW:[id number] DOB: September 03, 1932 DOA: 06/01/2024 PCP: [name], [name], MD  Brief History:   89 year old male with a history of coronary disease, hypertension, hyperlipidemia, persistent atrial fibrillation, squamous cell carcinoma of the neck, and CLL presenting with 2-day history of nausea, vomiting, and abdominal pain.  The patient most recently received his infusion of carboplatin  for his cancer on 05/30/2024.  On 05/13/2024, the patient received pegfilgrastim .  He began having abdominal pain with nausea and vomiting.  He endorsed vomiting some coffee grounds emesis but denies any frank bloody emesis.  There is no melena or hematochezia.  After he began vomiting, the patient began complaining of upper abdominal pain and chest pain.  He denies any fevers, chills, headache, coughing, hemoptysis, worsening shortness of breath.  He denies any hematuria but has some dysuria.  He denies any diarrhea.  His last bowel movement was 05/31/2024.  There is no hematochezia or melena. Because of worsening abdominal pain and vomiting he presented for further evaluation and treatment.  He denies any new medications. In the ED, the patient was afebrile and hemodynamically stable.  He was tachycardic in the 120s.  Oxygen  saturation was 95% room air.  WBC 79.9, hemoglobin 13.4, platelets 315.  Sodium 141, potassium 3.9, bicarbonate 27, serum creatinine 1.28.  LFTs were unremarkable.  CT of the abdomen and pelvis showed high-grade SBO with fluid distention of the stomach and small bowel to the distal portion of the small bowel.  There was liver cirrhosis.  There is cholelithiasis without biliary ductal dilatation.  Chest x-ray showed reticular opacities.  The patient was given a liter of normal saline.  He was given pantoprazole  and Zofran .  4 mg IV morphine  was given in the emergency department.  General surgery was consulted to assist management. NG decompression was initiated.   Patient was initially placed on diltiazem  drip for afib.  This was transitioned to amiodarone  drip in light of his EF 30-35%.   Assessment/Plan:  Small bowel obstruction - continue NG decompression - Remain n.p.o. - Judicious opioids - Optimize electrolytes - 8/23 SB study--SBO persists - due to comorbidities and low EF, anesthesia at [facility] feels pt needs transferred - Management per general surgery, SBO persist after 5 days, likely planned OR tomorrow by reviewing General surgery note - Will start TPN   Persistent atrial fibrillation with RVR - Management per cardiology . - On IV metoprolol  7.5 mg every 6 hours, heart rate in the 130s with RVR this morning, so increased to every 4 hours . - Optimize electrolyte, target potassium > 4, and magnesium  2. -Fluid on hold, on heparin  gtt. Meanwhile Questionable Torsades de Pointes?>>developed while on amiodarone    Chronic HFrEF - 04/05/2017 echo EF 40-45% - 06/01/24 echo EF 30-35% - Patient initially clinically hypovolemic. -Currently on any medical therapy due to n.p.o. status   Leukocytosis - Due to leukemoid reaction - secondary to pegfilgrastim  and stress demargination - No blasts noted on differential - Blood cultures x 2 sets--neg - WBC trending down   Lactic acidosis - Secondary to volume depletion - Given appropriate fluid boluses - 5.0>>1.5 - Patient is afebrile and hemodynamically stable   Squamous cell carcinoma of the head and neck - Follow-up with medical oncology-Dr. Davonna - Patient last received carboplatin  on 05/30/2024 - Pegfilgrastim  was given 05/31/2024   CLL - Patient follows with medical oncology - Currently does not meet criteria for treatment - Currently under observation  CKD stage IIIa - Baseline creatinine 0.8-1.1   Essential hypertension -on metoprolol  PTA>>now on IV lopressor  -Patient is on Cardizem  CD 120 mg prior to admission -BP soft>>improving   Mixed hyperlipidemia - Holding statin  and Zetia  until able to take po       Family Communication:   Discussed with patient in details, daughter was updated by general surgery, none at bedside   Consultants:  general surgery, cardiology   Code Status:  FULL    DVT Prophylaxis:  warfarin>> heparin  GTT     Procedures: As Listed in Progress Note Above   Antibiotics: None             Subjective: Reports some abdominal pain, reports he is passing gas but no BM yet, no problems with voiding, requesting some ice chips Objective: Vitals:   06/06/24 0636 06/06/24 0809 06/06/24 1030 06/06/24 1138  BP: (!) 143/87 (!) 140/83 (!) 151/99   Pulse: 79  85   Resp: 20  (!) 21 20  Temp:  98 F (36.7 C)  97.6 F (36.4 C)  TempSrc:  Axillary  Oral  SpO2: 92%  93%   Weight:      Height:        Intake/Output Summary (Last 24 hours) at 06/06/2024 1230 Last data filed at 06/06/2024 0300 Gross per 24 hour  Intake --  Output 2450 ml  Net -2450 ml   Weight change:  Exam:  Awake Alert, Oriented X 3, frail, deconditioned, chronically ill-appearing  Good air entry bilaterally  Irregularly irregular  Abdomen mildly distended, mild tenderness to palpation  Extremities with no edema.     Data Reviewed: I have personally reviewed following labs and imaging studies Basic Metabolic Panel: Recent Labs  Lab 06/02/24 0252 06/02/24 1237 06/03/24 0500 06/03/24 1153 06/04/24 0838 06/05/24 0027 06/06/24 0541  NA 140  --  142  --  140 142 143  K 4.0   < > 4.1 4.0 4.1 4.1 4.4  CL 105  --  107  --  102 105 108  CO2 26  --  27  --  24 27 22   GLUCOSE 125*  --  104*  --  118* 109* 77  BUN 33*  --  28*  --  36* 36* 34*  CREATININE 1.03  --  0.87  --  0.93 0.90 1.05  CALCIUM  8.6*  --  8.4*  --  8.6* 8.8* 8.8*  MG 1.9   < > 2.1 1.9 2.3 2.1 1.9  PHOS  --   --  2.8  --   --   --  3.7   < > = values in this interval not displayed.   Liver Function Tests: Recent Labs  Lab 06/01/24 1000 06/02/24 0252  AST 29 27  ALT 17 21   ALKPHOS 90 74  BILITOT 0.9 0.8  PROT 6.7 5.1*  ALBUMIN 3.4* 2.7*   No results for input(s): LIPASE, AMYLASE in the last 168 hours. No results for input(s): AMMONIA in the last 168 hours. Coagulation Profile: Recent Labs  Lab 06/02/24 0252 06/03/24 0500 06/04/24 0256 06/05/24 0027 06/06/24 0541  INR 2.2* 1.7* 1.6* 1.2 1.2   CBC: Recent Labs  Lab 06/01/24 1000 06/02/24 0252 06/03/24 0500 06/04/24 0256 06/05/24 0534 06/06/24 0541  WBC 79.9* 68.5* 58.8* 43.2* 25.9* 24.6*  NEUTROABS 72.7* 52.7* 43.9*  --   --   --   HGB 13.4 10.9* 12.1* 12.2* 11.8* 12.0*  HCT 40.1 33.8* 35.9* 36.8* 35.9* 36.4*  MCV 104.2* 106.0* 105.9* 104.8* 103.8* 102.2*  PLT 315 227 201 197 177 181   Cardiac Enzymes: No results for input(s): CKTOTAL, CKMB, CKMBINDEX, TROPONINI in the last 168 hours. BNP: Invalid input(s): POCBNP CBG: Recent Labs  Lab 06/05/24 2039 06/05/24 2334 06/06/24 0426 06/06/24 0811 06/06/24 1138  GLUCAP 92 91 92 88 88   HbA1C: No results for input(s): HGBA1C in the last 72 hours. Urine analysis:    Component Value Date/Time   COLORURINE YELLOW 06/01/2024 1750   APPEARANCEUR CLEAR 06/01/2024 1750   APPEARANCEUR Clear 05/22/2022 0941   LABSPEC >1.046 (H) 06/01/2024 1750   PHURINE 6.0 06/01/2024 1750   GLUCOSEU NEGATIVE 06/01/2024 1750   HGBUR SMALL (A) 06/01/2024 1750   BILIRUBINUR NEGATIVE 06/01/2024 1750   BILIRUBINUR Negative 05/22/2022 0941   KETONESUR NEGATIVE 06/01/2024 1750   PROTEINUR 30 (A) 06/01/2024 1750   UROBILINOGEN negative 02/14/2015 1247   UROBILINOGEN 0.2 01/21/2015 0850   NITRITE NEGATIVE 06/01/2024 1750   LEUKOCYTESUR NEGATIVE 06/01/2024 1750   Sepsis Labs: @LABRCNTIP (procalcitonin:4,lacticidven:4) ) Recent Results (from the past 240 hours)  Urine Culture (for pregnant, neutropenic or urologic patients or patients with an indwelling urinary catheter)     Status: Abnormal   Collection Time: 06/01/24  5:50 PM    Specimen: Urine, Clean Catch  Result Value Ref Range Status   Specimen Description   Final    URINE, CLEAN CATCH Performed at [facility], [address] [place], [place] [address]    Special Requests   Final    Immunocompromised Performed at [facility], [address] [place], [place] [address]    Culture (A)  Final    <10,000 COLONIES/mL INSIGNIFICANT GROWTH Performed at [facility], [address] [place], [place] [address]    Report Status 06/02/2024 FINAL  Final  Culture, blood (Routine X 2) w Reflex to ID Panel     Status: None (Preliminary result)   Collection Time: 06/01/24  6:14 PM   Specimen: BLOOD  Result Value Ref Range Status   Specimen Description BLOOD RIGHT ANTECUBITAL  Final   Special Requests   Final    BOTTLES DRAWN AEROBIC ONLY Blood Culture adequate volume   Culture   Final    NO GROWTH 4 DAYS Performed at [facility], [address], [place], [place] [address]    Report Status PENDING  Incomplete  Culture, blood (Routine X 2) w Reflex to ID Panel     Status: None (Preliminary result)   Collection Time: 06/01/24  6:14 PM   Specimen: BLOOD  Result Value Ref Range Status   Specimen Description BLOOD RIGHT ANTECUBITAL  Final   Special Requests   Final    BOTTLES DRAWN AEROBIC AND ANAEROBIC Blood Culture results may not be optimal due to an inadequate volume of blood received in culture bottles   Culture   Final    NO GROWTH 4 DAYS Performed at [facility], [address] [place], [place] [address]    Report Status PENDING  Incomplete  MRSA Next Gen by PCR, Nasal     Status: None   Collection Time: 06/01/24  6:38 PM   Specimen: Nasal Mucosa; Nasal Swab  Result Value Ref Range Status   MRSA by PCR Next Gen NOT DETECTED NOT DETECTED Final    Comment: (NOTE) The GeneXpert MRSA Assay (FDA approved for NASAL specimens only), is one component of a comprehensive MRSA colonization surveillance program. It is not intended to diagnose MRSA infection  nor to  guide or monitor treatment for MRSA infections. Test performance is not FDA approved in patients less than 47 years old. Performed at [facility], [address], [place], [place] [address]      Scheduled Meds:  artificial tears  1 drop Both Eyes Q6H   Chlorhexidine  Gluconate Cloth  6 each Topical Q0600   diatrizoate  meglumine -sodium  90 mL Per NG tube Once   digoxin   0.125 mg Intravenous Daily   [START ON 06/07/2024] insulin  aspart  0-6 Units Subcutaneous Q4H   metoprolol  tartrate  7.5 mg Intravenous Q4H   pantoprazole  (PROTONIX ) IV  40 mg Intravenous Q12H   sodium chloride  flush  10-40 mL Intracatheter Q12H   [START ON 06/07/2024] thiamine  (VITAMIN B1) injection  100 mg Intravenous Q24H   tobramycin   1 drop Both Eyes Q6H   Continuous Infusions:  heparin  1,350 Units/hr (06/06/24 0424)   magnesium  sulfate bolus IVPB 2 g (06/06/24 1226)   TPN ADULT (ION)      Procedures/Studies: DG Abd Portable 1V Result Date: 06/05/2024 CLINICAL DATA:  Nasogastric tube placement. EXAM: PORTABLE ABDOMEN - 1 VIEW COMPARISON:  Same day at 0804 hours. FINDINGS: Nasogastric tube terminates in the stomach with the side port beyond the gastroesophageal junction. Gaseous distension of bowel in the visualized abdomen. IMPRESSION: 1. Feeding tube terminates in the stomach with the side port well beyond the gastroesophageal junction. 2. Persistent gaseous distention of small bowel and colon. Electronically Signed   By: Newell Eke M.D.   On: 06/05/2024 12:30   DG Abd 1 View Result Date: 06/05/2024 CLINICAL DATA:  NG tube placement. EXAM: ABDOMEN - 1 VIEW COMPARISON:  Radiographs yesterday FINDINGS: Tip and side port of the enteric tube below the diaphragm in the stomach. Gaseous gastric distention persists. Prominent air-filled loops of small bowel centrally as well as air in the transverse colon, similar to yesterday's exam. Gallstones. IMPRESSION: 1. Tip and side port of the enteric tube below the  diaphragm in the stomach. 2. Persistent gaseous gastric distention. 3. Prominent air-filled loops of small bowel centrally as well as air in the transverse colon, similar to yesterday's exam. Electronically Signed   By: Andrea Gasman M.D.   On: 06/05/2024 10:22   DG Abd 1 View Result Date: 06/04/2024 CLINICAL DATA:  Small bowel obstructionr. EXAM: ABDOMEN - 1 VIEW COMPARISON:  06/03/2024 FINDINGS: NG tube tip is identified in the stomach. Diffuse gaseous distention of small bowel persists with similar appearance. Gas-filled dilated small bowel in the low abdomen measures 5.2 cm today compared to 4.9 cm previously. Air in stool is seen in a nondilated transverse and left colon. Stones in the right upper quadrant compatible with gallstones as seen on CT 06/01/2024. Oral contrast material is visible in the stomach previously but is no longer present. No opacification of small bowel or colonic contents evident. Patient is status post bilateral hip replacement. IMPRESSION: Persistent diffuse gaseous distention of small bowel with similar appearance to prior. Imaging features remain compatible with small bowel obstruction. Electronically Signed   By: Camellia Candle M.D.   On: 06/04/2024 09:52   DG Abd Portable 1V-Small Bowel Obstruction Protocol-initial, 8 hr delay Result Date: 06/03/2024 CLINICAL DATA:  SBO 8-hr delay image EXAM: PORTABLE ABDOMEN - 1 VIEW COMPARISON:  X-ray abdomen 06/03/2024 9:26 a.m. FINDINGS: Enteric tube with tip overlying the gastric lumen and side port overlying the expected region of the gastroesophageal junction. Persistent gaseous small bowel dilatation. PO contrast within the gastric lumen. Right upper quadrant cholelithiasis. Bilateral  hip degenerative changes. IMPRESSION: 1. Small-bowel obstruction. 2. PO contrast within the gastric lumen. 3. Enteric tube with tip overlying the gastric lumen and side port overlying the expected region of the gastroesophageal junction. 4.  Cholelithiasis. Electronically Signed   By: Morgane  Naveau M.D.   On: 06/03/2024 21:47   DG Abd Portable 1V-Small Bowel Protocol-Position Verification Result Date: 06/03/2024 CLINICAL DATA:  NG tube placement. EXAM: PORTABLE ABDOMEN - 1 VIEW COMPARISON:  Abdominal radiograph and CT 06/01/2024 FINDINGS: Tip of the weighted enteric tube in the stomach, the side port is likely below the diaphragm but not clearly defined on the current exam. Gaseous gastric distension. Gaseous small bowel distention up to 4.2 cm. Small amount of formed stool is in the colon. Right upper quadrant gallstones. IMPRESSION: 1. Tip of the weighted enteric tube in the stomach, the side port is likely below the diaphragm but not clearly defined on the current exam. 2. Gaseous gastric and small bowel distention, suspicious for obstruction. Electronically Signed   By: Andrea Gasman M.D.   On: 06/03/2024 10:23   DG Chest 1 View Result Date: 06/01/2024 CLINICAL DATA:  NG tube placement EXAM: CHEST  1 VIEW COMPARISON:  06/01/2024, 05/02/2024 FINDINGS: Sternotomy and cardiomegaly with strandy opacities at the bases. Enteric tube tip below the diaphragm, side-port in the region of the stomach, tip is non included. Marked air distension of the stomach IMPRESSION: Enteric tube tip below the diaphragm, side-port in the region of the stomach, tip is non included in the field of view. Marked air distension of the stomach. Electronically Signed   By: Luke Bun M.D.   On: 06/01/2024 20:16   DG Abdomen 1 View Result Date: 06/01/2024 CLINICAL DATA:  NG tube placement- had to put new one in due to getting pulled out. EXAM: ABDOMEN - 1 VIEW COMPARISON:  None Available. FINDINGS: The bowel gas pattern is non-obstructive. No evidence of pneumoperitoneum, within the limitations of a supine film. No acute osseous abnormalities. The soft tissues are within normal limits. Surgical changes, devices, tubes and lines: Interval placement of nasogastric  tube with its tip overlying the fundus of the stomach region. However, side hole is just above the left hemidiaphragm in the GE junction region. Recommend further advancement by 2-3 inches to confidently put the side-hole into the stomach. IMPRESSION: *Interval placement of nasogastric tube with its tip overlying the fundus of the stomach region. However, side hole is just above the left hemidiaphragm in the GE junction region. Recommend further advancement by 2-3 inches to confidently put the side-hole into the stomach. Electronically Signed   By: Ree Molt M.D.   On: 06/01/2024 17:14   ECHOCARDIOGRAM COMPLETE Result Date: 06/01/2024    ECHOCARDIOGRAM REPORT   Patient Name:   TRA WILEMON Date of Exam: 06/01/2024 Medical Rec #:  [id number]    Height:       66.0 in Accession #:    [id number]   Weight:       172.0 lb Date of Birth:  1932-05-24   BSA:          1.876 m Patient Age:    89 years     BP:           126/85 mmHg Patient Gender: M            HR:           107 bpm. Exam Location:  [facility] Procedure: 2D Echo, Cardiac Doppler and Color Doppler (Both Spectral and  Color            Flow Doppler were utilized during procedure). Indications:    A-fib  History:        Patient has no prior history of Echocardiogram examinations.                 Arrythmias:Atrial Fibrillation; Risk Factors:Hypertension.  Sonographer:    Vella Key Referring Phys: Clarissa.Climes DAVID TAT IMPRESSIONS  1. Challenging study in the setting of tachycardia, HR 120-130s.  2. Left ventricular ejection fraction, by estimation, is 30 to 35%. The left ventricle has moderately decreased function. Left ventricular endocardial border not optimally defined to evaluate regional wall motion. Left ventricular diastolic function could not be evaluated.  3. Right ventricular systolic function was not well visualized. The right ventricular size is moderately enlarged. There is normal pulmonary artery systolic pressure.  4. Left atrial size was severely  dilated.  5. Right atrial size was severely dilated.  6. The mitral valve is grossly normal. Mild mitral valve regurgitation. No evidence of mitral stenosis.  7. The aortic valve was not well visualized. Aortic valve regurgitation is not visualized. No aortic stenosis is present.  8. Aortic dilatation noted. There is mild dilatation of the aortic root, measuring 40 mm.  9. The inferior vena cava is dilated in size with >50% respiratory variability, suggesting right atrial pressure of 8 mmHg. Comparison(s): No prior Echocardiogram. FINDINGS  Left Ventricle: Left ventricular ejection fraction, by estimation, is 30 to 35%. The left ventricle has moderately decreased function. Left ventricular endocardial border not optimally defined to evaluate regional wall motion. Strain was performed and the global longitudinal strain is indeterminate. The left ventricular internal cavity size was normal in size. There is no left ventricular hypertrophy. Left ventricular diastolic function could not be evaluated due to atrial fibrillation. Left ventricular diastolic function could not be evaluated. Right Ventricle: The right ventricular size is moderately enlarged. No increase in right ventricular wall thickness. Right ventricular systolic function was not well visualized. There is normal pulmonary artery systolic pressure. The tricuspid regurgitant velocity is 2.60 m/s, and with an assumed right atrial pressure of 8 mmHg, the estimated right ventricular systolic pressure is 35.0 mmHg. Left Atrium: Left atrial size was severely dilated. Right Atrium: Right atrial size was severely dilated. Pericardium: There is no evidence of pericardial effusion. Mitral Valve: The mitral valve is grossly normal. Mild mitral valve regurgitation. No evidence of mitral valve stenosis. Tricuspid Valve: The tricuspid valve is normal in structure. Tricuspid valve regurgitation is mild . No evidence of tricuspid stenosis. Aortic Valve: The aortic valve  was not well visualized. Aortic valve regurgitation is not visualized. No aortic stenosis is present. Pulmonic Valve: The pulmonic valve was not well visualized. Pulmonic valve regurgitation is not visualized. No evidence of pulmonic stenosis. Aorta: Aortic dilatation noted. There is mild dilatation of the aortic root, measuring 40 mm. Venous: The inferior vena cava is dilated in size with greater than 50% respiratory variability, suggesting right atrial pressure of 8 mmHg. IAS/Shunts: No atrial level shunt detected by color flow Doppler. Additional Comments: 3D was performed not requiring image post processing on an independent workstation and was indeterminate.  LEFT VENTRICLE PLAX 2D LVIDd:         4.80 cm      Diastology LVIDs:         4.20 cm      LV e' medial:    4.35 cm/s LV PW:  1.20 cm      LV E/e' medial:  26.9 LV IVS:        1.10 cm      LV e' lateral:   3.92 cm/s LVOT diam:     1.70 cm      LV E/e' lateral: 29.8 LV SV:         25 LV SV Index:   13 LVOT Area:     2.27 cm  LV Volumes (MOD) LV vol d, MOD A2C: 111.0 ml LV vol d, MOD A4C: 96.9 ml LV vol s, MOD A2C: 58.0 ml LV vol s, MOD A4C: 51.7 ml LV SV MOD A2C:     53.0 ml LV SV MOD A4C:     96.9 ml LV SV MOD BP:      51.5 ml RIGHT VENTRICLE RV Basal diam:  4.60 cm RV S prime:     8.49 cm/s TAPSE (M-mode): 1.2 cm LEFT ATRIUM              Index        RIGHT ATRIUM           Index LA diam:        6.40 cm  3.41 cm/m   RA Area:     26.50 cm LA Vol (A2C):   111.0 ml 59.18 ml/m  RA Volume:   87.50 ml  46.65 ml/m LA Vol (A4C):   108.0 ml 57.58 ml/m LA Biplane Vol: 120.0 ml 63.98 ml/m  AORTIC VALVE LVOT Vmax:   64.60 cm/s LVOT Vmean:  48.900 cm/s LVOT VTI:    0.110 m  AORTA Ao Root diam: 4.00 cm MITRAL VALVE                TRICUSPID VALVE MV Area (PHT): 12.64 cm    TV Peak grad:   27.0 mmHg MV Decel Time: 60 msec      TV Vmax:        2.60 m/s MV E velocity: 117.00 cm/s  TR Peak grad:   27.0 mmHg MV A velocity: 50.70 cm/s   TR Vmax:        260.00  cm/s MV E/A ratio:  2.31                             SHUNTS                             Systemic VTI:  0.11 m                             Systemic Diam: 1.70 cm Vishnu Priya Mallipeddi Electronically signed by Diannah Late Mallipeddi Signature Date/Time: 06/01/2024/4:13:53 PM    Final    DG Abdomen 1 View Result Date: 06/01/2024 CLINICAL DATA:  NG tube placement EXAM: ABDOMEN - 1 VIEW COMPARISON:  06/01/2024 FINDINGS: Weighted feeding tube terminates in the distended stomach. Redemonstrated findings of small-bowel obstruction. No pneumoperitoneum. No organomegaly or radiopaque calculi. Osteopenia. Bilateral hip arthroplasties. Cardiomegaly. Sternotomy wires and CABG markers. IMPRESSION: Weighted feeding tube terminates in the distended stomach. Electronically Signed   By: Rogelia Myers M.D.   On: 06/01/2024 13:54   CT ABDOMEN PELVIS W CONTRAST Result Date: 06/01/2024 CLINICAL DATA:  Abdominal pain, acute nonlocalized.  Vomiting blood. EXAM: CT ABDOMEN AND PELVIS WITH CONTRAST TECHNIQUE: Multidetector CT imaging of the abdomen and  pelvis was performed using the standard protocol following bolus administration of intravenous contrast. RADIATION DOSE REDUCTION: This exam was performed according to the departmental dose-optimization program which includes automated exposure control, adjustment of the mA and/or kV according to patient size and/or use of iterative reconstruction technique. CONTRAST:  OMNIPAQUE  IOHEXOL  300 MG/ML  SOLN COMPARISON:  CT chest 05/02/2024 FINDINGS: Lower chest: Chronic scarring at the lung bases. Known adenopathy in the right hilum. See results of previous chest CT. Hepatobiliary: Cirrhosis of the liver with enlarged left lobe and nodular surface of the liver. No sign of liver mass. Multiple calcified gallstones without CT evidence of cholecystitis or obstruction. Pancreas: Normal Spleen: Normal Adrenals/Urinary Tract: Adrenal glands are normal. No renal obstruction or worrisome  lesion. Simple appearing cysts. Bladder poorly seen because of artifact from hip replacements. Stomach/Bowel: Fluid distension of the stomach and small intestine to the midportion of the small intestine, with relative collapse of the small intestine distal to that. Findings consistent with high-grade small bowel obstruction. Specific etiology not specifically elucidated. Ordinary diverticulosis of the left colon without evidence of diverticulitis. Vascular/Lymphatic: Aortic atherosclerosis. No aneurysm. IVC is normal. No adenopathy. Reproductive: Normal other than enlarged prostate. Other: No free fluid or air. Musculoskeletal: subacute compression fracture of T12 with loss of height of 50%. No gross retropulsion. This was present in May. IMPRESSION: 1. High-grade small bowel obstruction with fluid distension of the stomach and small intestine to the midportion of the small intestine, with relative collapse of the small intestine distal to that. Specific etiology not elucidated. 2. Cirrhosis of the liver. 3. Cholelithiasis without CT evidence of cholecystitis or obstruction. 4. Aortic atherosclerosis. 5. Subacute compression fracture of T12 with loss of height of 50%. No gross retropulsion. This was present in May. 6. Known adenopathy in the right hilum. See results of previous chest CT. Aortic Atherosclerosis (ICD10-I70.0). Electronically Signed   By: Oneil Officer M.D.   On: 06/01/2024 11:33   DG Chest Port 1 View Result Date: 06/01/2024 EXAM: 1 VIEW XRAY OF THE CHEST 06/01/2024 10:10:00 AM COMPARISON: [facility] radiograph of the chest dated 06/09/2022. CLINICAL HISTORY: [id number] GI bleed [id number]. Pt bib ems from home with reports of vomiting blood. Family reports it looked like coffee ground emesis. Onset yesterday afternoon. Abdomen hard and distended which is new per family. Last chemo 2 days ago. Complaining of chest pain, reports that is ; where his cancer is. Afib on monitor. Given 4mg  zofran  en route. ; 120/60; HR  100-130; 93% RA. Hx of afib, [name], hypertension, CABG. Former smoker FINDINGS: LUNGS AND PLEURA: Coarse reticular opacities present within the mid and lower lung zones, which appear chronic. No focal pulmonary opacity. No pulmonary edema. No pleural effusion. No pneumothorax. HEART AND MEDIASTINUM: The heart is enlarged. The patient is status post CABG. No acute abnormality of the cardiac and mediastinal silhouettes. BONES AND SOFT TISSUES: No acute osseous abnormality. A left subclavian chest border is present as before. IMPRESSION: 1. No acute findings. 2. Enlarged heart and status post CABG. 3. Chronic coarse reticular opacities in the mid and lower lung zones. Electronically signed by: Evalene Coho MD 06/01/2024 10:30 AM EDT RP Workstation: [id number]    Brayton Lye, MD [facility]  If 7PM-7AM, please contact night-coverage [email or web address] Password [email or web address] 06/06/2024, 12:30 PM   LOS: 5 days

## 2024-06-06 NOTE — Consult Note (Addendum)
 [name] Apr 19, 1932  [id number].    Requesting MD: Greg Budge, MD Chief Complaint/Reason for Consult: SBO  HPI: [name] is a 89 y.o. male with a PMH significant for HTN, HLD, Chronic HFrEF with EF 30-35% on 06/01/24, A. 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 No fever, diarrhea, melena or hematochezia. Last BM was 05/31/24. He was not passing flatus on presentation.  CT was concerning for high grade SBO. WBC 79.9 and lactic acid elevation of 4.9. Patient was admitted to [facility] and general surgery consulted there. He did start passing flatus on 8/22. Lactic acid cleared. SBO protocol was given but contrast appeared to still be in the stomach on 8 hour delay films and was not visualized on subsequent films. He reports his NGT was not functioning yesterday and he had an episode of emesis. NGT was replaced and is in position on last xray. His hospital course was complicated by A. Fib w/ RVR that he was initially started on dilt gtt for, transitioned to amio due to soft BP's and low EF but then developed Torsades. He is currently on digoxin  and scheduled metoprolol  with HR in the 110's while I was in the room. He had persistent high NGT output and failure to improve on KUB. Patient was transferred to [facility] due to concern for possible operative needs in setting of poor cardiac function. He currently complains of ongoing nausea and generalized abdominal pain that is worst in the RLQ with some improvement since admission. He continues to pass flatus and reports he is passing more over the last few days. No BM since 8/22. NGT output 2.1L/24 hours. He has a hx of prior open appendectomy in 2005 by Dr. Ozell Gander. No other abdominal surgeries.  No hx of prior SBO's. He denies alcohol  or tobacco use. He did have evidence of Cirrhosis on his admission CT - LFT's wnl on last check. He is allergic to PCNs, hydrochlorothiazide, statins, cetuximab . He reports he can walk ~59ft without having to stop secondary to fatigue. He walks at baseline independently and lives at home with his wife.   ROS: ROS As above, see hpi  Family History  Problem Relation Age of Onset   Stroke Mother    Stroke Father    Hypertension Father    Early death Brother        50 months old   Cancer Brother        lung   Stroke Brother        heat    Past Medical History:  Diagnosis Date   Anemia    Atrial fibrillation (HCC)    Cataract    Cellulitis    In past   Complication of anesthesia    HARD TIME WAKING; CAUSES MY BP TO GO UP    Coronary artery disease    5 bypasses   DJD (degenerative joint disease)    Ejection fraction < 50%    Mildly reduced, 40% by echo   GERD (gastroesophageal reflux disease)    Hyperlipidemia    Hypertension    Persistent atrial fibrillation (HCC)    Prostate hypertrophy    on CT scan 09/2014   PUD (peptic ulcer disease)    RESOLVED  Skin cancer of eyelid    Resected    Past Surgical History:  Procedure Laterality Date   APPENDECTOMY     BYPASS GRAFT  2005   CATARACT EXTRACTION W/PHACO Left 07/22/2015   Procedure: CATARACT EXTRACTION PHACO AND INTRAOCULAR LENS PLACEMENT LEFT EYE CDE=8.14;  Surgeon: Greg Mania, MD;  Location: [facility];  Service: Ophthalmology;  Laterality: Left;   CATARACT EXTRACTION W/PHACO Right 08/18/2019   Procedure: CATARACT EXTRACTION PHACO AND INTRAOCULAR LENS PLACEMENT (IOC);  Surgeon: Greg Agent, MD;  Location: [facility];  Service: Ophthalmology;  Laterality: Right;  CDE: 9.74   CORONARY ARTERY BYPASS GRAFT  4/05   LIMA to LAD, SVG to PDA, SVG to ramus intermediate   EYE SURGERY  07/2015   EYELID CARCINOMA EXCISION     Skin cancer resected   Heart bypass  2005   LYMPH NODE BIOPSY  Right 06/09/2022   Procedure: LYMPH NODE BIOPSY; supraclavicular;  Surgeon: Greg Manuelita BROCKS, MD;  Location: [facility];  Service: General;  Laterality: Right;   PORTACATH PLACEMENT Left 06/09/2022   Procedure: INSERTION PORT-A-CATH;  Surgeon: Greg Manuelita BROCKS, MD;  Location: [facility];  Service: General;  Laterality: Left;   TOTAL HIP ARTHROPLASTY     Right   TOTAL HIP ARTHROPLASTY Left 05/04/2018   Procedure: LEFT TOTAL HIP ARTHROPLASTY;  Surgeon: Greg Ingle, MD;  Location: [facility];  Service: Orthopedics;  Laterality: Left;   TOTAL KNEE ARTHROPLASTY     Right    Social History:  reports that he quit smoking about 60 years ago. His smoking use included cigarettes. He started smoking about 65 years ago. He has a 10 pack-year smoking history. He has never used smokeless tobacco. He reports that he does not drink alcohol  and does not use drugs.  Allergies:  Allergies  Allergen Reactions   Penicillins Hives and Rash    Has patient had a PCN reaction causing immediate rash, facial/tongue/throat swelling, SOB or lightheadedness with hypotension: Yes Has patient had a PCN reaction causing severe rash involving mucus membranes or skin necrosis: Yes Has patient had a PCN reaction that required hospitalization: No Has patient had a PCN reaction occurring within the last 10 years: No If all of the above answers are NO, then may proceed with Cephalosporin use.    Cetuximab  Cough    Cough and scratchy throat. Drug rechallenged and pt able to tolerated the rest of the infusion without any complications.    Hct [Hydrochlorothiazide] Other (See Comments)    hyper   Statins Other (See Comments)    Myalgia, tolerates low dosages of lovastatin      Medications Prior to Admission  Medication Sig Dispense Refill   acetaminophen  (TYLENOL ) 500 MG tablet Take 1,000 mg by mouth every 6 (six) hours as needed for moderate pain.     Cholecalciferol (VITAMIN D3) 5000 units CAPS Take 5,000 Units by mouth once a  week.      clindamycin  (CLINDAGEL) 1 % gel Apply 1 Application topically daily at 6 (six) AM.     diltiazem  (CARDIZEM  CD) 120 MG 24 hr capsule TAKE ONE CAPSULE BY MOUTH DAILY 90 capsule 0   doxycycline  (VIBRA -TABS) 100 MG tablet Take 100 mg by mouth 2 (two) times daily.     ezetimibe  (ZETIA ) 10 MG tablet TAKE ONE (1) TABLET BY MOUTH EVERY DAY 90 tablet 0   finasteride  (PROSCAR ) 5 MG tablet TAKE ONE (1) TABLET EACH DAY 90 tablet 3   fluocinonide  cream (LIDEX ) 0.05 % Apply 1 Application topically  2 (two) times daily. 60 g 3   fluticasone  (FLONASE ) 50 MCG/ACT nasal spray Place 1 spray into both nostrils daily as needed for allergies or rhinitis.     furosemide  (LASIX ) 20 MG tablet Take 1 tablet (20 mg total) by mouth daily. 90 tablet 3   lovastatin  (MEVACOR ) 40 MG tablet Take 1 tablet (40 mg total) by mouth at bedtime. 90 tablet 3   magnesium  oxide (MAG-OX) 400 (240 Mg) MG tablet Take 1 tablet (400 mg total) by mouth 2 (two) times daily. 60 tablet 3   methylphenidate  (RITALIN ) 5 MG tablet TAKE 1 TABLET BY MOUTH DAILY AS NEEDED 30 tablet 0   metoprolol  tartrate (LOPRESSOR ) 50 MG tablet Take 1 tablet (50 mg total) by mouth 2 (two) times daily. (Patient taking differently: Take 50 mg by mouth daily.) 180 tablet 3   montelukast  (SINGULAIR ) 10 MG tablet Take 1 tablet (10 mg total) by mouth at bedtime. 90 tablet 3   prochlorperazine  (COMPAZINE ) 10 MG tablet Take 1 tablet (10 mg total) by mouth every 6 (six) hours as needed for nausea or vomiting. 30 tablet 2   tobramycin  (TOBREX ) 0.3 % ophthalmic solution Place 1 drop into both eyes in the morning, at noon, in the evening, and at bedtime.     warfarin (COUMADIN ) 5 MG tablet TAKE 1 TABLET ON SATURDAY, SUNDAY, TUESDAY, & THURSDAY. TAKE 1/2 TABLET ON MONDAY, WEDNESDAY, & FRIDAY (Patient taking differently: Take 5 mg by mouth See admin instructions. Take 1 tablet on Sunday and Wednesday and then take 1/2 tablet on all other days.) 70 tablet 3    trimethoprim-polymyxin b  (POLYTRIM) ophthalmic solution SMARTSIG:In Eye(s) (Patient not taking: Reported on 06/01/2024)       Physical Exam: Blood pressure (!) 140/83, pulse 79, temperature 98 F (36.7 C), temperature source Axillary, resp. rate 20, height 5' 6 (1.676 m), weight 80.1 kg, SpO2 92%. General: pleasant, WD/WN elderly male who is laying in bed in NAD HEENT: head is normocephalic, atraumatic.  Sclera are non-icteric. Heart: Irr, Irr Lungs: Respiratory effort nonlabored Abd: Soft, moderate distension, generalized ttp greatest in the RLQ without rigidity or guarding. No masses or hernias palpated. Open appendectomy scar noted. MS: no BUE edema Skin: warm and dry  Psych: A&Ox4 with an appropriate affect Neuro: normal speech, thought process intact, moves all extremities, gait not assessed  Results for orders placed or performed during the hospital encounter of 06/01/24 (from the past 48 hours)  Heparin  level (unfractionated)     Status: Abnormal   Collection Time: 06/04/24  1:12 PM  Result Value Ref Range   Heparin  Unfractionated 0.22 (L) 0.30 - 0.70 IU/mL    Comment: (NOTE) The clinical reportable range upper limit is being lowered to >1.10 to align with the FDA approved guidance for the current laboratory assay.  If heparin  results are below expected values, and patient dosage has  been confirmed, suggest follow up testing of antithrombin III levels. Performed at [facility], [address] [place], [place] [address]   Heparin  level (unfractionated)     Status: None   Collection Time: 06/05/24 12:27 AM  Result Value Ref Range   Heparin  Unfractionated 0.41 0.30 - 0.70 IU/mL    Comment: (NOTE) The clinical reportable range upper limit is being lowered to >1.10 to align with the FDA approved guidance for the current laboratory assay.  If heparin  results are below expected values, and patient dosage has  been confirmed, suggest follow up testing of antithrombin III  levels. Performed  at [facility], [address], [place], [place] [address]   Basic metabolic panel with GFR     Status: Abnormal   Collection Time: 06/05/24 12:27 AM  Result Value Ref Range   Sodium 142 135 - 145 mmol/L   Potassium 4.1 3.5 - 5.1 mmol/L   Chloride 105 98 - 111 mmol/L   CO2 27 22 - 32 mmol/L   Glucose, Bld 109 (H) 70 - 99 mg/dL    Comment: Glucose reference range applies only to samples taken after fasting for at least 8 hours.   BUN 36 (H) 8 - 23 mg/dL   Creatinine, Ser 9.09 0.61 - 1.24 mg/dL   Calcium  8.8 (L) 8.9 - 10.3 mg/dL   GFR, Estimated >39 >39 mL/min    Comment: (NOTE) Calculated using the CKD-EPI Creatinine Equation (2021)    Anion gap 10 5 - 15    Comment: Performed at [facility], [address], [place], [place] [address]  Magnesium      Status: None   Collection Time: 06/05/24 12:27 AM  Result Value Ref Range   Magnesium  2.1 1.7 - 2.4 mg/dL    Comment: Performed at [facility], [address], [place], [place] [address]  Protime-INR     Status: Abnormal   Collection Time: 06/05/24 12:27 AM  Result Value Ref Range   Prothrombin Time 15.7 (H) 11.4 - 15.2 seconds   INR 1.2 0.8 - 1.2    Comment: (NOTE) INR goal varies based on device and disease states. Performed at [facility], [address], [place], [place] [address]   Heparin  level (unfractionated)     Status: Abnormal   Collection Time: 06/05/24  5:34 AM  Result Value Ref Range   Heparin  Unfractionated >1.10 (H) 0.30 - 0.70 IU/mL    Comment: (NOTE) The clinical reportable range upper limit is being lowered to >1.10 to align with the FDA approved guidance for the current laboratory assay.  If heparin  results are below expected values, and patient dosage has  been confirmed, suggest follow up testing of antithrombin III levels. Performed at [facility], [address] [place], [place] [address]   CBC     Status: Abnormal   Collection Time: 06/05/24  5:34 AM  Result Value Ref Range    WBC 25.9 (H) 4.0 - 10.5 K/uL   RBC 3.46 (L) 4.22 - 5.81 MIL/uL   Hemoglobin 11.8 (L) 13.0 - 17.0 g/dL   HCT 64.0 (L) 60.9 - 47.9 %   MCV 103.8 (H) 80.0 - 100.0 fL   MCH 34.1 (H) 26.0 - 34.0 pg   MCHC 32.9 30.0 - 36.0 g/dL   RDW 85.8 88.4 - 84.4 %   Platelets 177 150 - 400 K/uL   nRBC 0.0 0.0 - 0.2 %    Comment: Performed at [facility], [address] [place], [place] [address]  Heparin  level (unfractionated)     Status: None   Collection Time: 06/05/24  8:20 AM  Result Value Ref Range   Heparin  Unfractionated 0.39 0.30 - 0.70 IU/mL    Comment: (NOTE) The clinical reportable range upper limit is being lowered to >1.10 to align with the FDA approved guidance for the current laboratory assay.  If heparin  results are below expected values, and patient dosage has  been confirmed, suggest follow up testing of antithrombin III levels. Performed at [facility], [address], [place], [place] [address]   Glucose, capillary     Status: Abnormal   Collection Time: 06/05/24  12:00 PM  Result Value Ref Range   Glucose-Capillary 113 (H) 70 - 99 mg/dL    Comment: Glucose reference range applies only to samples taken after fasting for at least 8 hours.  Glucose, capillary     Status: None   Collection Time: 06/05/24  4:17 PM  Result Value Ref Range   Glucose-Capillary 87 70 - 99 mg/dL    Comment: Glucose reference range applies only to samples taken after fasting for at least 8 hours.  Glucose, capillary     Status: None   Collection Time: 06/05/24  8:39 PM  Result Value Ref Range   Glucose-Capillary 92 70 - 99 mg/dL    Comment: Glucose reference range applies only to samples taken after fasting for at least 8 hours.  Glucose, capillary     Status: None   Collection Time: 06/05/24 11:34 PM  Result Value Ref Range   Glucose-Capillary 91 70 - 99 mg/dL    Comment: Glucose reference range applies only to samples taken after fasting for at least 8 hours.  Glucose, capillary     Status: None    Collection Time: 06/06/24  4:26 AM  Result Value Ref Range   Glucose-Capillary 92 70 - 99 mg/dL    Comment: Glucose reference range applies only to samples taken after fasting for at least 8 hours.  Basic metabolic panel with GFR     Status: Abnormal   Collection Time: 06/06/24  5:41 AM  Result Value Ref Range   Sodium 143 135 - 145 mmol/L   Potassium 4.4 3.5 - 5.1 mmol/L   Chloride 108 98 - 111 mmol/L   CO2 22 22 - 32 mmol/L   Glucose, Bld 77 70 - 99 mg/dL    Comment: Glucose reference range applies only to samples taken after fasting for at least 8 hours.   BUN 34 (H) 8 - 23 mg/dL   Creatinine, Ser 8.94 0.61 - 1.24 mg/dL   Calcium  8.8 (L) 8.9 - 10.3 mg/dL   GFR, Estimated >39 >39 mL/min    Comment: (NOTE) Calculated using the CKD-EPI Creatinine Equation (2021)    Anion gap 13 5 - 15    Comment: Performed at [facility], [address] [place], [place] [address]  CBC     Status: Abnormal   Collection Time: 06/06/24  5:41 AM  Result Value Ref Range   WBC 24.6 (H) 4.0 - 10.5 K/uL   RBC 3.56 (L) 4.22 - 5.81 MIL/uL   Hemoglobin 12.0 (L) 13.0 - 17.0 g/dL   HCT 63.5 (L) 60.9 - 47.9 %   MCV 102.2 (H) 80.0 - 100.0 fL   MCH 33.7 26.0 - 34.0 pg   MCHC 33.0 30.0 - 36.0 g/dL   RDW 86.1 88.4 - 84.4 %   Platelets 181 150 - 400 K/uL   nRBC 0.0 0.0 - 0.2 %    Comment: Performed at [facility], [address], [place], [place] [address]  Protime-INR     Status: Abnormal   Collection Time: 06/06/24  5:41 AM  Result Value Ref Range   Prothrombin Time 15.4 (H) 11.4 - 15.2 seconds   INR 1.2 0.8 - 1.2    Comment: (NOTE) INR goal varies based on device and disease states. Performed at [facility], [address], [place], [place] [address]   Heparin  level (unfractionated)     Status: None   Collection Time: 06/06/24  5:41 AM  Result Value Ref Range  Heparin  Unfractionated 0.42 0.30 - 0.70 IU/mL    Comment: (NOTE) The clinical reportable range upper limit is  being lowered to >1.10 to align with the FDA approved guidance for the current laboratory assay.  If heparin  results are below expected values, and patient dosage has  been confirmed, suggest follow up testing of antithrombin III levels. Performed at [facility], [address] [place], [place] [address]   Glucose, capillary     Status: None   Collection Time: 06/06/24  8:11 AM  Result Value Ref Range   Glucose-Capillary 88 70 - 99 mg/dL    Comment: Glucose reference range applies only to samples taken after fasting for at least 8 hours.   Comment 1 Notify RN    DG Abd Portable 1V Result Date: 06/05/2024 CLINICAL DATA:  Nasogastric tube placement. EXAM: PORTABLE ABDOMEN - 1 VIEW COMPARISON:  Same day at 0804 hours. FINDINGS: Nasogastric tube terminates in the stomach with the side port beyond the gastroesophageal junction. Gaseous distension of bowel in the visualized abdomen. IMPRESSION: 1. Feeding tube terminates in the stomach with the side port well beyond the gastroesophageal junction. 2. Persistent gaseous distention of small bowel and colon. Electronically Signed   By: Newell Eke M.D.   On: 06/05/2024 12:30   DG Abd 1 View Result Date: 06/05/2024 CLINICAL DATA:  NG tube placement. EXAM: ABDOMEN - 1 VIEW COMPARISON:  Radiographs yesterday FINDINGS: Tip and side port of the enteric tube below the diaphragm in the stomach. Gaseous gastric distention persists. Prominent air-filled loops of small bowel centrally as well as air in the transverse colon, similar to yesterday's exam. Gallstones. IMPRESSION: 1. Tip and side port of the enteric tube below the diaphragm in the stomach. 2. Persistent gaseous gastric distention. 3. Prominent air-filled loops of small bowel centrally as well as air in the transverse colon, similar to yesterday's exam. Electronically Signed   By: Andrea Gasman M.D.   On: 06/05/2024 10:22    Anti-infectives (From admission, onward)    None          Assessment/Plan SBO  - CT 8/21 with high-grade SBO with fluid distention of the stomach and small intestine to midportion with relative collapse distal to that, cirrhosis of the liver, cholelithiasis without signs of cholecystitis, aortic atherosclerosis, subacute T12 compression fracture, known right hilar adenopathy - Prior abdominal surgery includes appendectomy  - SBO protocol given 8/23 - 8h delay with contrast in the gastric lumen - Persistent distention noted on the follow up films and pt continues to have high NGT output  - He is passing flatus. No BM since admission.  - On exam, he is moderately distended but soft with no peritonitis - Afebrile. Still with intermittent tachycardia in setting of A. Fib. No current hypotension.  - Lactic acid cleared. WBC downtrending   Patient has now been here for 5 days with SBO that is not improving on imaging with continued abdominal pain, distention, nausea and high NG tube output. I suspect that the patient will need an operation for his SBO during this admission but given he reports some improvement of his abdominal pain, has begun to pass flatus since admission and has air in his colon on follow-up films I think a be reasonable to repeat the SBO protocol.  I discussed with the patient and family that if he is not improving tomorrow clinically/radiographically he may need to proceed to the OR for exploratory laparotomy.  I discussed with him that I think  he is high risk for surgery at increased risk for complications.  Please see ACS risk calculator as above. I discussed with the patient the planned procedure and material risks. Risks include but are not limited to anesthesia (MI, CVA, prolonged intubation, aspiration, death), pain, bleeding,  infection, scarring, hernia, damage to surrounding structures (blood vessels/nerves/viscus/organs/ureter), ileus, leak from anastomosis, wound issues, DVT/PE and delay in chemo. We also discussed typical  post-operative care including the possible need for rehab/snf if necessary. The patient's questions were answered to their satisfaction, they voiced understanding and elected to proceed with surgery tomorrow if he was not improving clinically/radiographically.  Would ask cardiology and TRH to optimize patient in the meantime.   FEN - NPO, IVF per TRH, NGT to LIWS. May need to consider starting TPN - defer to primary  VTE - SCDs, heparin  gtt, coumadin  held  ID - no current abx Foley - not present, spont voids  - per TRH -  HTN HLD Chronic HFrEF - EF 30-35% 06/01/24 Persistent A. 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  This care required high  level of medical decision making.   Ozell CHRISTELLA Shaper, [facility] 06/06/2024, 9:59 AM Please see Amion for pager number during day hours 7:00am-4:30pm

## 2024-06-06 NOTE — Plan of Care (Signed)

## 2024-06-06 NOTE — Progress Notes (Addendum)
 PHARMACY - TOTAL PARENTERAL NUTRITION CONSULT NOTE   Indication: Small bowel obstruction  Patient Measurements: Height: 5' 6 (167.6 cm) Weight: 71.8 kg (158 lb 4.6 oz) IBW/kg (Calculated) : 63.8 TPN AdjBW (KG): 67.9 Body mass index is 28.5 kg/m. 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 His baseline weight from medical oncology on 05/08/2024 is 77.6 kg. Current weight is 71.8 kg showing a loss of ~6 kg of body weight in a month. He is in need of TPN. A consult is received  Glucose / Insulin : CBG <180, not on insulin  PTA; HgbA1C 5.7% [02/27/2014] Electrolytes: Na 143, Cl 108, K 4.4, Ca 8.8 [CoCa 9.84], Phos 3.7, Mag 1.9 Renal: Scr 1.05, BUN 34 Hepatic: Alk Phos / AST / ALT wnl, Alb 2.7, TG 108 [03/01/2024] Intake / Output; MIVF: UOP 0.23 ml/kg/hr; NG 2100 mL; LBM PTA [05/31/2024] GI Imaging: 8/21 VT abd shows high-grade small bowel obstruction w/ fluid distention of stomach and small intestine to mid-portion of small intestine w/ relative collapse of the small intestine distal to that 8/25 DG abd shows persistent gaseous distention of small bowel GI Surgeries / Procedures:  No recent GI surgeries noted at [facility] health or in Care Everywhere  Central access: Patient has Port-a-cath we will utilize TPN start date: 06/06/2024  Nutritional Goals: Goal TPN rate is 75 mL/hr (provides 101 g of protein and 1850 kcals per day)  RD Assessment: Estimated Needs Total Energy Estimated Needs: 1800-2000 kcals Total Protein Estimated Needs: 95-115g Total Fluid Estimated Needs: 1.8-2L/day  Current Nutrition:  NPO  Plan:  Start TPN at 21mL/hr at 1800. This will supply ~50% of protein and Kcal needs for the day Electrolytes in TPN: Na 165mEq/L, K 127mEq/L, Ca 10mEq/L, Mg 15mEq/L, and Phos 25mmol/L. Cl:Ac 1:2 Add standard MVI and  trace elements to TPN Thiamine  100 mg iv qday x5 days d/t risk of refeeding syndrome [8/26 >> 8/30] Mag 2 gram iv x1 Initiate Sensitive q4h SSI and adjust as needed (start at 00:00 06/07/2024) Reduce MIVF to 0 mL/hr at 1800 [currently no mIVF running] Monitor TPN labs on Mon/Thurs, daily x3 and prn    Willmer Fellers BS, PharmD, BCPS Clinical Pharmacist 06/06/2024 3:00 PM  Contact: [phone] after 3 PM

## 2024-06-06 NOTE — Progress Notes (Addendum)
 Progress Note  Patient Name: [name] Date of Encounter: 06/06/2024 [facility] Cardiologist: Lynwood Schilling, MD   Interval Summary   Patient still with abdominal pain this morning Waiting evaluation/plan from surgical team  Denies any chest pain, palpitations Family reports that he was of normal health prior to this abdominal pain  Vital Signs Vitals:   06/06/24 0408 06/06/24 0612 06/06/24 0636 06/06/24 0809  BP: (!) 151/84 (!) 158/105 (!) 143/87 (!) 140/83  Pulse: 96 (!) 125 79   Resp: 18 20 20    Temp: 98.9 F (37.2 C)   98 F (36.7 C)  TempSrc: Oral   Axillary  SpO2: 94% 93% 92%   Weight:      Height:        Intake/Output Summary (Last 24 hours) at 06/06/2024 1047 Last data filed at 06/06/2024 0300 Gross per 24 hour  Intake --  Output 2450 ml  Net -2450 ml      06/01/2024    7:02 PM 05/30/2024    8:07 AM 05/25/2024   12:47 PM  Last 3 Weights  Weight (lbs) 176 lb 9.4 oz 172 lb 170 lb  Weight (kg) 80.1 kg 78.019 kg 77.111 kg     Telemetry/ECG  Atrial fibrillation, HR primarily 90-110s, some episodes around 140s - Personally Reviewed  Physical Exam  GEN: No acute distress.   Neck: No JVD Cardiac: irregularly irregular, tachycardic, no murmurs, rubs, or gallops.  Respiratory: Clear to auscultation bilaterally. GI: Soft, nontender, non-distended  MS: No edema  Assessment & Plan   Permanent atrial fibrillation Episode of VT  Presented with A. Fib with RVR, likely in setting of SBO Started on IV diltiazem  ? became hypotensive  Started on IV amiodarone  ? episode of VT (does not appear to be true torsades per limited CV strips available from prior facility)  Given loading dose digoxin , maintenance not started  Currently in A. 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 LAD, SVG ? PDA, SVG ? ramus intermediate  No active chest pain this admission 03/01/2024: HDL 48; LDL Chol Calc (NIH) 92 06/02/2024: ALT 21  Holding home meds until no longer NPO  Per primary SBO  CKD 3a Lactic acidosis  CLL   For questions or updates, please contact [facility] Please consult [email or web address] for contact info under       Signed, Waddell DELENA Donath, PA-C   I have personally seen and examined the patient.  My HPI, Exam, and assessment and plan are below, independent of the NPP above.  No symptoms of AF.  Has had PAF progressing to LSPAF for years   Exam notable for  Gen: ill appearing Neck: No JVD Cardiac: No Rubs or Gallops, sysotlic Murmur, IRIR tachycardia, +2 radial pulses Respiratory: Clear to auscultation bilaterally, normal effort, normal  respiratory rate Integument: Skin feels warm Neuro:  At time of evaluation, alert  and oriented to person/place/time/situation  Psych: Normal affect, patient feels fair  Tele: AF with variable rates; I am unable to see [facility] tracing that shows true TPD but do not have access to all tracings.  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  Surgery is reasonable to proceed; they have no cardiac concerns with intervention  Stanly Leavens, MD FASE [facility] Cardiologist [facility]  [address] [place], [address] [place], [place] [address] [phone]  12:09 PM

## 2024-06-07 ENCOUNTER — Inpatient Hospital Stay (HOSPITAL_COMMUNITY)

## 2024-06-07 ENCOUNTER — Other Ambulatory Visit: Payer: Self-pay

## 2024-06-07 DIAGNOSIS — I4821 Permanent atrial fibrillation: Secondary | ICD-10-CM | POA: Diagnosis not present

## 2024-06-07 DIAGNOSIS — Z7189 Other specified counseling: Secondary | ICD-10-CM | POA: Diagnosis not present

## 2024-06-07 DIAGNOSIS — Z515 Encounter for palliative care: Secondary | ICD-10-CM | POA: Diagnosis not present

## 2024-06-07 DIAGNOSIS — D72829 Elevated white blood cell count, unspecified: Secondary | ICD-10-CM

## 2024-06-07 DIAGNOSIS — I2510 Atherosclerotic heart disease of native coronary artery without angina pectoris: Secondary | ICD-10-CM | POA: Diagnosis not present

## 2024-06-07 LAB — GLUCOSE, CAPILLARY
Glucose-Capillary: 130 mg/dL — ABNORMAL HIGH (ref 70–99)
Glucose-Capillary: 138 mg/dL — ABNORMAL HIGH (ref 70–99)
Glucose-Capillary: 155 mg/dL — ABNORMAL HIGH (ref 70–99)
Glucose-Capillary: 153 mg/dL — ABNORMAL HIGH (ref 70–99)
Glucose-Capillary: 124 mg/dL — ABNORMAL HIGH (ref 70–99)
Glucose-Capillary: 153 mg/dL — ABNORMAL HIGH (ref 70–99)

## 2024-06-07 LAB — COMPREHENSIVE METABOLIC PANEL WITH GFR
ALT: 31 U/L (ref 0–44)
AST: 42 U/L — ABNORMAL HIGH (ref 15–41)
Albumin: 2.6 g/dL — ABNORMAL LOW (ref 3.5–5.0)
Alkaline Phosphatase: 138 U/L — ABNORMAL HIGH (ref 38–126)
Anion gap: 12 (ref 5–15)
BUN: 34 mg/dL — ABNORMAL HIGH (ref 8–23)
CO2: 25 mmol/L (ref 22–32)
Calcium: 8.7 mg/dL — ABNORMAL LOW (ref 8.9–10.3)
Chloride: 107 mmol/L (ref 98–111)
Creatinine, Ser: 0.97 mg/dL (ref 0.61–1.24)
GFR, Estimated: >60 mL/min (ref >60)
Glucose, Bld: 141 mg/dL — ABNORMAL HIGH (ref 70–99)
Potassium: 3.8 mmol/L (ref 3.5–5.1)
Sodium: 144 mmol/L (ref 135–145)
Total Bilirubin: 1 mg/dL (ref 0.0–1.2)
Total Protein: 5.2 g/dL — ABNORMAL LOW (ref 6.5–8.1)

## 2024-06-07 LAB — CULTURE, BLOOD (ROUTINE X 2)
Culture: NO GROWTH
Culture: NO GROWTH
Special Requests: ADEQUATE

## 2024-06-07 LAB — HEPARIN LEVEL (UNFRACTIONATED): Heparin Unfractionated: 0.42 [IU]/mL (ref 0.30–0.70)

## 2024-06-07 LAB — CBC
HCT: 35.3 % — ABNORMAL LOW (ref 39.0–52.0)
Hemoglobin: 11.8 g/dL — ABNORMAL LOW (ref 13.0–17.0)
MCH: 34 pg (ref 26.0–34.0)
MCHC: 33.4 g/dL (ref 30.0–36.0)
MCV: 101.7 fL — ABNORMAL HIGH (ref 80.0–100.0)
Platelets: 153 K/uL (ref 150–400)
RBC: 3.47 MIL/uL — ABNORMAL LOW (ref 4.22–5.81)
RDW: 13.9 % (ref 11.5–15.5)
WBC: 24.8 K/uL — ABNORMAL HIGH (ref 4.0–10.5)
nRBC: 0 % (ref 0.0–0.2)

## 2024-06-07 LAB — MAGNESIUM: Magnesium: 2.3 mg/dL (ref 1.7–2.4)

## 2024-06-07 LAB — PHOSPHORUS: Phosphorus: 3.1 mg/dL (ref 2.5–4.6)

## 2024-06-07 MED ORDER — TRACE MINERALS CU-MN-SE-ZN 300-55-60-3000 MCG/ML IV SOLN
INTRAVENOUS | Status: AC
  Filled 2024-06-07: qty 537.6

## 2024-06-07 MED ORDER — HEPARIN SOD (PORK) LOCK FLUSH 100 UNIT/ML IV SOLN
500.0000 [IU] | INTRAVENOUS | Status: DC
  Filled 2024-06-07: qty 5

## 2024-06-07 MED ORDER — HEPARIN SOD (PORK) LOCK FLUSH 100 UNIT/ML IV SOLN
500.0000 [IU] | INTRAVENOUS | Status: DC | PRN
  Administered 2024-06-07: 500 [IU]
  Filled 2024-06-07: qty 5

## 2024-06-07 MED ORDER — FENTANYL CITRATE PF 50 MCG/ML IJ SOSY
25.0000 ug | PREFILLED_SYRINGE | INTRAMUSCULAR | Status: DC | PRN
  Administered 2024-06-08: 25 ug via INTRAVENOUS
  Filled 2024-06-07: qty 1

## 2024-06-07 MED ORDER — POTASSIUM CHLORIDE 10 MEQ/100ML IV SOLN
10.0000 meq | INTRAVENOUS | Status: AC
  Administered 2024-06-07 (×2): 10 meq via INTRAVENOUS
  Filled 2024-06-07: qty 100

## 2024-06-07 MED ORDER — POTASSIUM CHLORIDE 10 MEQ/50ML IV SOLN
10.0000 meq | INTRAVENOUS | Status: DC
  Filled 2024-06-07 (×2): qty 50

## 2024-06-07 NOTE — Progress Notes (Signed)
 PHARMACY - TOTAL PARENTERAL NUTRITION CONSULT NOTE   Indication: Small bowel obstruction  Patient Measurements: Height: 5' 6 (167.6 cm) Weight: 71.8 kg (158 lb 4.6 oz) IBW/kg (Calculated) : 63.8 TPN AdjBW (KG): 67.9 Body mass index is 25.55 kg/m. 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 His baseline weight from medical oncology on 05/08/2024 is 77.6 kg. Current weight is 71.8 kg showing a loss of ~6 kg of body weight in a month. He is in need of TPN. A consult is received  Glucose / Insulin : CBG <160, 1 units iSS given in past 24 hours Electrolytes: Na 144, Cl 107, K 3.8, Ca 8.7 [CoCa 9.82], Phos 3.1, Mag 2.3 Renal: Scr 0.97, BUN 34 Hepatic: Alk Phos 138 / AST 42 / ALT wnl, Alb 2.6, TG 108 [03/01/2024] Intake / Output; MIVF: UOP 0.3 ml/kg/hr; NG 1700 mL; LBM PTA [05/31/2024] GI Imaging: 8/21 VT abd shows high-grade small bowel obstruction w/ fluid distention of stomach and small intestine to mid-portion of small intestine w/ relative collapse of the small intestine distal to that 8/25 DG abd shows persistent gaseous distention of small bowel GI Surgeries / Procedures:  No recent GI surgeries noted at [facility] health or in Care Everywhere  Central access: Patient has Port-a-cath we will utilize TPN start date: 06/06/2024  Nutritional Goals: Goal TPN rate is 75 mL/hr (provides 101 g of protein and 1850 kcals per day)  RD Assessment: Estimated Needs Total Energy Estimated Needs: 1800-2000 kcals Total Protein Estimated Needs: 95-115g Total Fluid Estimated Needs: 1.8-2L/day  Current Nutrition:  NPO  Plan:  Increase TPN to 61mL/hr at 1800. This will supply ~80% of protein and Kcal needs for the day Electrolytes in TPN: Na 68 mEq/L, K 68 mEq/L, Ca 6.7 mEq/L, Mg 10 mEq/L, and Phos 20 mmol/L. Cl:Ac 1:2 Add standard MVI  and trace elements to TPN Thiamine  100 mg iv qday x5 days d/t risk of refeeding syndrome [8/26 >> 8/30] KCL 10 meq iv x2 runs Initiate Sensitive q4h SSI and adjust as needed Monitor TPN labs on Mon/Thurs, daily x3 and prn    Kamree Wiens BS, PharmD, BCPS Clinical Pharmacist 06/07/2024 7:04 AM  Contact: [phone] after 3 PM

## 2024-06-07 NOTE — Progress Notes (Addendum)
 PROGRESS NOTE        PATIENT DETAILS Name: [name] Age: 89 y.o. Sex: male Date of Birth: 06-28-1932 Admit Date: 06/01/2024 Admitting Physician Alm Schneider, MD [name], [name], MD  Brief Summary: Patient is a 89 y.o.  male with history of squamous cell carcinoma of his head/neck-on chemotherapy-who presented to [facility] ED on 8/21  with SBO-A-fib RVR.  Started on NG tube decompression in the ED-amiodarone  infusion-but reportedly had a episode of torsades while on amiodarone  drip.  Patient was transferred to [facility] for further evaluation.  Significant events: 8/21>> admit to [facility] 8/25>> transferred to [facility].  Significant studies: 8/21>> CT abdomen/pelvis: High-grade SBO.   8/21>> echo: EF 30-35%. 8/23>> SBO protocol x-ray: SBO. 8/26>> SBO protocol x-ray: Contrast lies in the stomach/proximal small bowel.  Significant microbiology data: 8/21>> urine culture:<10,000 colonies/mL. 8/21>> blood culture: No growth  Procedures: None  Consults: [facility] Cards  Subjective: Passed flatus-but no BM overnight.  No abdominal pain.  Objective: Vitals: Blood pressure (!) 143/90, pulse (!) 114, temperature (!) 97.4 F (36.3 C), temperature source Oral, resp. rate 20, height 5' 6 (1.676 m), weight 71.8 kg, SpO2 94%.   Exam: Gen Exam:Alert awake-not in any distress HEENT:atraumatic, normocephalic Chest: B/L clear to auscultation anteriorly CVS:S1S2 regular Abdomen:soft non tender, non distended Extremities:no edema Neurology: Non focal Skin: no rash  Pertinent Labs/Radiology:    Latest Ref Rng & Units 06/07/2024    5:19 AM 06/06/2024    5:41 AM 06/05/2024    5:34 AM  CBC  WBC 4.0 - 10.5 K/uL 24.8  24.6  25.9   Hemoglobin 13.0 - 17.0 g/dL 88.1  87.9  88.1   Hematocrit 39.0 - 52.0 % 35.3  36.4  35.9   Platelets 150 - 400 K/uL 153  181  177     Lab Results  Component Value Date   NA 144 06/07/2024   K 3.8 06/07/2024   CL 107 06/07/2024   CO2 25  06/07/2024      Assessment/Plan: SBO Remains n.p.o./NG tube decompression/TNA-no BM yet but passed flatus this morning. General surgery following-with plans to tentatively take to the OR later today. Await further recommendations from general surgery. Agree with cardiology note on 8/26-suspect patient is as optimized as one could be given his medical comorbidities-for contemplated surgery.  Leukemoid reaction Secondary to recent filgrastim infusion on 8/20 Not felt to have sepsis/infection issues at this point.  Persistent A-fib with RVR Rate relatively well-controlled Continue digoxin /Lopressor  IV heparin  per pharmacy.  Torsades This occurred at [facility] 8/23-while on amiodarone  infusion Keep K> 4, Mg> 2.  Chronic HFmrEF with worsening EF (30-35% by echo on 8/21) Volume status reasonable  History of [name] s/p CABG 2005 No anginal symptoms.  HTN BP stable On Lopressor .  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  History of CLL Follows with oncology Under observation.  CKD stage IIIa Close to baseline.  ?Cirrhosis Seen on CT imaging this admission and also on numerous prior CTs Not sure if this has been formally diagnosed in the past Currently this appears stable  Chronic subacute compression fracture of T12 Incidental finding on CT imaging-Per radiology-this was present in May as well.  Palliative care Frail elderly 89 year old with  multiple medical problems-here with SBO-with no resolution in spite of conservative measures-surgical options being considered-high risk for surgery given numerous medical comorbidities.  DNR in place-palliative care following and engaging with family.  Nutrition Status: Nutrition Problem: Moderate Malnutrition Etiology:  chronic illness Signs/Symptoms: mild fat depletion, moderate muscle depletion, severe muscle depletion, percent weight loss Percent weight loss: 7.4 % Interventions: TPN, MVI, Education  Code status:   Code Status: Limited: Do not attempt resuscitation (DNR) -DNR-LIMITED -Do Not Intubate/DNI    DVT Prophylaxis: IV heparin    Family Communication: Daughter at bedside   Disposition Plan: Status is: Inpatient Remains inpatient appropriate because: Severity of illness   Planned Discharge Destination:Home health vs SNF   Diet: Diet Order             Diet NPO time specified  Diet effective now                     Antimicrobial agents: Anti-infectives (From admission, onward)    None        MEDICATIONS: Scheduled Meds:  artificial tears  1 drop Both Eyes Q6H   Chlorhexidine  Gluconate Cloth  6 each Topical Q0600   digoxin   0.125 mg Intravenous Daily   insulin  aspart  0-6 Units Subcutaneous Q4H   metoprolol  tartrate  7.5 mg Intravenous Q4H   pantoprazole  (PROTONIX ) IV  40 mg Intravenous Q12H   sodium chloride  flush  10-40 mL Intracatheter Q12H   thiamine  (VITAMIN B1) injection  100 mg Intravenous Q24H   tobramycin   1 drop Both Eyes Q6H   Continuous Infusions:  heparin  1,350 Units/hr (06/07/24 9385)   potassium chloride      TPN ADULT (ION) 40 mL/hr at 06/07/24 0614   PRN Meds:.fentaNYL  (SUBLIMAZE ) injection, phenol, prochlorperazine , sodium chloride  flush   I have personally reviewed following labs and imaging studies  LABORATORY DATA: CBC: Recent Labs  Lab 06/01/24 1000 06/02/24 0252 06/03/24 0500 06/04/24 0256 06/05/24 0534 06/06/24 0541 06/07/24 0519  WBC 79.9* 68.5* 58.8* 43.2* 25.9* 24.6* 24.8*  NEUTROABS 72.7* 52.7* 43.9*  --   --   --   --   HGB 13.4 10.9* 12.1* 12.2* 11.8* 12.0* 11.8*  HCT 40.1 33.8* 35.9* 36.8* 35.9* 36.4* 35.3*  MCV 104.2* 106.0* 105.9* 104.8* 103.8* 102.2* 101.7*  PLT 315 227 201 197 177 181 153    Basic Metabolic  Panel: Recent Labs  Lab 06/03/24 0500 06/03/24 1153 06/04/24 0838 06/05/24 0027 06/06/24 0541 06/07/24 0519  NA 142  --  140 142 143 144  K 4.1 4.0 4.1 4.1 4.4 3.8  CL 107  --  102 105 108 107  CO2 27  --  24 27 22 25   GLUCOSE 104*  --  118* 109* 77 141*  BUN 28*  --  36* 36* 34* 34*  CREATININE 0.87  --  0.93 0.90 1.05 0.97  CALCIUM  8.4*  --  8.6* 8.8* 8.8* 8.7*  MG 2.1 1.9 2.3 2.1 1.9 2.3  PHOS 2.8  --   --   --  3.7 3.1    GFR: Estimated Creatinine Clearance: 46.6 mL/min (by C-G formula based on SCr of 0.97 mg/dL).  Liver Function Tests: Recent Labs  Lab 06/01/24 1000 06/02/24 0252 06/07/24 0519  AST 29 27 42*  ALT 17 21 31   ALKPHOS 90 74 138*  BILITOT 0.9 0.8 1.0  PROT 6.7 5.1* 5.2*  ALBUMIN 3.4* 2.7* 2.6*   No results for input(s): LIPASE, AMYLASE in the last 168 hours. No results  for input(s): AMMONIA in the last 168 hours.  Coagulation Profile: Recent Labs  Lab 06/02/24 0252 06/03/24 0500 06/04/24 0256 06/05/24 0027 06/06/24 0541  INR 2.2* 1.7* 1.6* 1.2 1.2    Cardiac Enzymes: No results for input(s): CKTOTAL, CKMB, CKMBINDEX, TROPONINI in the last 168 hours.  BNP (last 3 results) No results for input(s): PROBNP in the last 8760 hours.  Lipid Profile: No results for input(s): CHOL, HDL, LDLCALC, TRIG, CHOLHDL, LDLDIRECT in the last 72 hours.  Thyroid  Function Tests: No results for input(s): TSH, T4TOTAL, FREET4, T3FREE, THYROIDAB in the last 72 hours.  Anemia Panel: No results for input(s): VITAMINB12, FOLATE, FERRITIN, TIBC, IRON, RETICCTPCT in the last 72 hours.  Urine analysis:    Component Value Date/Time   COLORURINE YELLOW 06/01/2024 1750   APPEARANCEUR CLEAR 06/01/2024 1750   APPEARANCEUR Clear 05/22/2022 0941   LABSPEC >1.046 (H) 06/01/2024 1750   PHURINE 6.0 06/01/2024 1750   GLUCOSEU NEGATIVE 06/01/2024 1750   HGBUR SMALL (A) 06/01/2024 1750   BILIRUBINUR NEGATIVE 06/01/2024  1750   BILIRUBINUR Negative 05/22/2022 0941   KETONESUR NEGATIVE 06/01/2024 1750   PROTEINUR 30 (A) 06/01/2024 1750   UROBILINOGEN negative 02/14/2015 1247   UROBILINOGEN 0.2 01/21/2015 0850   NITRITE NEGATIVE 06/01/2024 1750   LEUKOCYTESUR NEGATIVE 06/01/2024 1750    Sepsis Labs: Lactic Acid, Venous    Component Value Date/Time   LATICACIDVEN 1.5 06/02/2024 0252    MICROBIOLOGY: Recent Results (from the past 240 hours)  Urine Culture (for pregnant, neutropenic or urologic patients or patients with an indwelling urinary catheter)     Status: Abnormal   Collection Time: 06/01/24  5:50 PM   Specimen: Urine, Clean Catch  Result Value Ref Range Status   Specimen Description   Final    URINE, CLEAN CATCH Performed at [facility], [address], [place], [place] [address]    Special Requests   Final    Immunocompromised Performed at [facility], [address] [place], [place] [address]    Culture (A)  Final    <10,000 COLONIES/mL INSIGNIFICANT GROWTH Performed at [facility], [address], [place], [place] [address]    Report Status 06/02/2024 FINAL  Final  Culture, blood (Routine X 2) w Reflex to ID Panel     Status: None   Collection Time: 06/01/24  6:14 PM   Specimen: BLOOD  Result Value Ref Range Status   Specimen Description BLOOD RIGHT ANTECUBITAL  Final   Special Requests   Final    BOTTLES DRAWN AEROBIC ONLY Blood Culture adequate volume   Culture   Final    NO GROWTH 6 DAYS Performed at [facility], [address], [place], [place] [address]    Report Status 06/07/2024 FINAL  Final  Culture, blood (Routine X 2) w Reflex to ID Panel     Status: None   Collection Time: 06/01/24  6:14 PM   Specimen: BLOOD  Result Value Ref Range Status   Specimen Description BLOOD RIGHT ANTECUBITAL  Final   Special Requests   Final    BOTTLES DRAWN AEROBIC AND ANAEROBIC Blood Culture results may not be optimal due to an inadequate volume of blood received in culture  bottles   Culture   Final    NO GROWTH 6 DAYS Performed at [facility], [address] [place], [place] [address]    Report Status 06/07/2024 FINAL  Final  MRSA Next Gen by PCR, Nasal     Status: None   Collection  Time: 06/01/24  6:38 PM   Specimen: Nasal Mucosa; Nasal Swab  Result Value Ref Range Status   MRSA by PCR Next Gen NOT DETECTED NOT DETECTED Final    Comment: (NOTE) The GeneXpert MRSA Assay (FDA approved for NASAL specimens only), is one component of a comprehensive MRSA colonization surveillance program. It is not intended to diagnose MRSA infection nor to guide or monitor treatment for MRSA infections. Test performance is not FDA approved in patients less than 42 years old. Performed at [facility], [address], [place], [place] [address]     RADIOLOGY STUDIES/RESULTS: DG Abd Portable 1V-Small Bowel Obstruction Protocol-initial, 8 hr delay Result Date: 06/07/2024 CLINICAL DATA:  8 hour small-bowel follow-through EXAM: PORTABLE ABDOMEN - 1 VIEW COMPARISON:  Film from the previous day. FINDINGS: Gastric catheter is noted within the stomach. Administered contrast lies within the stomach and proximal mildly dilated small bowel. Persistent small bowel dilatation is noted. No colonic contrast is seen. Bilateral hip replacements are noted. Degenerative change of the lumbar spine is noted. IMPRESSION: Administered contrast lies primarily within the stomach and proximal small bowel. No distal small bowel visualization is noted. 24 hour film is recommended. Electronically Signed   By: Oneil Devonshire M.D.   On: 06/07/2024 00:14   DG Abd Portable 1V Result Date: 06/05/2024 CLINICAL DATA:  Nasogastric tube placement. EXAM: PORTABLE ABDOMEN - 1 VIEW COMPARISON:  Same day at 0804 hours. FINDINGS: Nasogastric tube terminates in the stomach with the side port beyond the gastroesophageal junction. Gaseous distension of bowel in the visualized abdomen. IMPRESSION: 1. Feeding tube terminates in  the stomach with the side port well beyond the gastroesophageal junction. 2. Persistent gaseous distention of small bowel and colon. Electronically Signed   By: Newell Eke M.D.   On: 06/05/2024 12:30     LOS: 6 days   Donalda Applebaum, MD  [facility]    To contact the attending provider between 7A-7P or the covering provider during after hours 7P-7A, please log into the web site [email or web address] and access using universal [place] password for that web site. If you do not have the password, please call the hospital operator.  06/07/2024, 9:26 AM

## 2024-06-07 NOTE — Progress Notes (Signed)
 Peripherally Inserted Central Catheter Placement  The IV Nurse has discussed with the patient and/or persons authorized to consent for the patient, the purpose of this procedure and the potential benefits and risks involved with this procedure.  The benefits include less needle sticks, lab draws from the catheter, and the patient may be discharged home with the catheter. Risks include, but not limited to, infection, bleeding, blood clot (thrombus formation), and puncture of an artery; nerve damage and irregular heartbeat and possibility to perform a PICC exchange if needed/ordered by physician.  Alternatives to this procedure were also discussed.  Bard Power PICC patient education guide, fact sheet on infection prevention and patient information card has been provided to patient /or left at bedside.    PICC Placement Documentation  PICC Double Lumen 06/07/24 Right Basilic 40 cm 0 cm (Active)  Indication for Insertion or Continuance of Line Administration of hyperosmolar/irritating solutions (i.e. TPN, Vancomycin , etc.) 06/07/24 1236  Exposed Catheter (cm) 0 cm 06/07/24 1236  Site Assessment Clean, Dry, Intact 06/07/24 1236  Lumen #1 Status Flushed;Saline locked;Blood return noted 06/07/24 1236  Lumen #2 Status Flushed;Saline locked;Blood return noted 06/07/24 1236  Dressing Type Transparent;Securing device 06/07/24 1236  Dressing Status Antimicrobial disc/dressing in place;Clean, Dry, Intact 06/07/24 1236  Line Care Connections checked and tightened 06/07/24 1236  Line Adjustment (NICU/IV Team Only) No 06/07/24 1236  Dressing Intervention New dressing;Adhesive placed at insertion site (IV team only) 06/07/24 1236  Dressing Change Due 06/14/24 06/07/24 1236       [name] 06/07/2024, 12:37 PM

## 2024-06-07 NOTE — Consult Note (Signed)
 PHARMACY - ANTICOAGULATION CONSULT NOTE  Pharmacy Consult for heparin  infusion Indication: atrial fibrillation  Allergies  Allergen Reactions   Penicillins Hives and Rash    Has patient had a PCN reaction causing immediate rash, facial/tongue/throat swelling, SOB or lightheadedness with hypotension: Yes Has patient had a PCN reaction causing severe rash involving mucus membranes or skin necrosis: Yes Has patient had a PCN reaction that required hospitalization: No Has patient had a PCN reaction occurring within the last 10 years: No If all of the above answers are NO, then may proceed with Cephalosporin use.    Cetuximab  Cough    Cough and scratchy throat. Drug rechallenged and pt able to tolerated the rest of the infusion without any complications.    Hct [Hydrochlorothiazide] Other (See Comments)    hyper   Statins Other (See Comments)    Myalgia, tolerates low dosages of lovastatin      Patient Measurements: Height: 5' 6 (167.6 cm) Weight: 71.8 kg (158 lb 4.6 oz) IBW/kg (Calculated) : 63.8 HEPARIN  DW (KG): 79.9  Vital Signs: Temp: 97.6 F (36.4 C) (08/27 0517) Temp Source: Axillary (08/27 0517) BP: 137/75 (08/27 0517) Pulse Rate: 114 (08/27 0517)  Labs: Recent Labs    06/05/24 0027 06/05/24 0027 06/05/24 0534 06/05/24 0820 06/06/24 0541 06/07/24 0519  HGB  --    < > 11.8*  --  12.0* 11.8*  HCT  --   --  35.9*  --  36.4* 35.3*  PLT  --   --  177  --  181 153  LABPROT 15.7*  --   --   --  15.4*  --   INR 1.2  --   --   --  1.2  --   HEPARINUNFRC 0.41  --  >1.10* 0.39 0.42 0.42  CREATININE 0.90  --   --   --  1.05 0.97   < > = values in this interval not displayed.    Estimated Creatinine Clearance: 46.6 mL/min (by C-G formula based on SCr of 0.97 mg/dL).   Medical History: Past Medical History:  Diagnosis Date   Anemia    Atrial fibrillation (HCC)    Cataract    Cellulitis    In past   Complication of anesthesia    HARD TIME WAKING; CAUSES MY BP TO  GO UP    Coronary artery disease    5 bypasses   DJD (degenerative joint disease)    Ejection fraction < 50%    Mildly reduced, 40% by echo   GERD (gastroesophageal reflux disease)    Hyperlipidemia    Hypertension    Persistent atrial fibrillation (HCC)    Prostate hypertrophy    on CT scan 09/2014   PUD (peptic ulcer disease)    RESOLVED    Skin cancer of eyelid    Resected    Medications:  Patient on Warfarin 5 mg Sun/Wed and 2.5 mg AOD, currently on hold for possible procedure  Assessment: 89 yo male presented to ED on 8/21 with complaint of abdominal pain, nausea, and vomiting.  Patient admitted for treatment of small bowel obstruction.  Patient anticoagulated with Warfarin for Afib, this is currently being held.  Pharmacy consulted to initiate heparin  infusion.  8/27 heparin  level 0.42 - therapeutic on 1350 units/hr, CBC stable   Goal of Therapy:  Heparin  level 0.3-0.7 units/ml Monitor platelets by anticoagulation protocol: Yes   Plan:  Continue heparin  1350 units/hr Heparin  level with AM labs  Koren Or, PharmD Clinical Pharmacist 06/07/2024 8:35  AM Please check AMION for all [facility] Pharmacy numbers

## 2024-06-07 NOTE — Progress Notes (Signed)
   Palliative Medicine Inpatient Follow Up Note HPI: [name] is a 89 y.o. male with a PMH significant for HTN, HLD, Chronic HFrEF with EF 30-35% on 06/01/24, A. 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 Patient without improvement over the past five days despite SBO protocol. He is a high surgical risk for surgical intervention which is being considered therefore the PMT has been asled to assist in GOC conversation(s).   Today's Discussion 06/07/2024  *Please note that this is a verbal dictation therefore any spelling or grammatical errors are due to the Dragon Medical One system interpretation.  Chart reviewed inclusive of vital signs, progress notes, laboratory results, and diagnostic images. There was a lack of suction yesterday with NGT - is was correct with > 1.5L removed thereafter.   I spoke with [name]'s RN, [name] who notes no significant concerns this morning.   I met with [name] this morning. His abdomen is soft to the touch. He denies pain or tenderness during palpation. He shares with me that he has had a lot of pooty (gas) overnight to morning.   He expresses that he is in agreement with additional procedures if that is what is believed to be best for him. He is understanding of the risks and even possible death associated with surgical interventions.   Created space and opportunity for patient to explore thoughts feelings and fears regarding current medical situation. He is very much at peace with the next steps in his care. His goals are to remain functional.  Questions and concerns addressed/Palliative Support Provided.   Objective Assessment: Vital Signs Vitals:   06/07/24 0118 06/07/24 0517  BP:  137/75  Pulse:  (!) 114  Resp:   20  Temp: 98.8 F (37.1 C) 97.6 F (36.4 C)  SpO2:  94%    Intake/Output Summary (Last 24 hours) at 06/07/2024 0801 Last data filed at 06/07/2024 9385 Gross per 24 hour  Intake 1341.18 ml  Output 2025 ml  Net -683.82 ml   Last Weight  Most recent update: 06/06/2024  2:56 PM    Weight  71.8 kg (158 lb 4.6 oz)            Gen: Elderly Caucasian male chronically ill in appearance HEENT: Nasogastric tube in place, moist mucous membranes CV: Irregular rate and rhythm PULM: On room air breathing is even and unlabored ABD: soft/nontender EXT: No edema Neuro: Alert and oriented x3  SUMMARY OF RECOMMENDATIONS   DNAR/DNI   [name] shares a good amount of gas overnight  In regards to the decision for surgery he, as of this morning is willing to pursue what the recommendation(s) are of the medical specialists   Ongoing palliative care support ______________________________________________________________________________________ Rosaline Becton [facility] Palliative Medicine Team Team Cell Phone: [phone] Please utilize secure chat with additional questions, if there is no response within 30 minutes please call the above phone number  Billing based on MDM: High   Palliative Medicine Team providers are available by phone from 7am to 7pm daily and can be reached through the team cell phone.  Should this patient require assistance outside of these hours, please call the patient's attending physician.

## 2024-06-07 NOTE — Progress Notes (Deleted)
 Pt arrived from ED via stretcher at approx 1345.  Pt  settled into room, tele monitor placed, VS obtained.  Skin check and pictures  done with charge nurse Rocky, RN.  Pt given snacks and some water .  Bed alarm on for safety, call bell within reach

## 2024-06-07 NOTE — Progress Notes (Signed)
 General Surgery Follow Up Note  Subjective:    Overnight Issues:   Objective:  Vital signs for last 24 hours: Temp:  [97.6 F (36.4 C)-98.8 F (37.1 C)] 97.6 F (36.4 C) (08/27 0517) Pulse Rate:  [85-114] 114 (08/27 0517) Resp:  [19-25] 20 (08/27 0517) BP: (128-158)/(72-99) 137/75 (08/27 0517) SpO2:  [93 %-96 %] 94 % (08/27 0517) Weight:  [71.8 kg] 71.8 kg (08/26 1456)  Hemodynamic parameters for last 24 hours:    Intake/Output from previous day: 08/26 0701 - 08/27 0700 In: 1341.2 [I.V.:1341.2] Out: 2025 [Urine:325; Emesis/NG output:1700]  Intake/Output this shift: No intake/output data recorded.  Vent settings for last 24 hours:    Physical Exam:  Gen: comfortable, no distress Neuro: follows commands, alert, communicative HEENT: PERRL Neck: supple CV: RRR Pulm: unlabored breathing on RA Abd: soft, NT  , +BM  GU: urine clear and yellow, +spontaneous void Extr: wwp, no edema  Results for orders placed or performed during the hospital encounter of 06/01/24 (from the past 24 hours)  Glucose, capillary     Status: None   Collection Time: 06/06/24 11:38 AM  Result Value Ref Range   Glucose-Capillary 88 70 - 99 mg/dL  Glucose, capillary     Status: None   Collection Time: 06/06/24  5:03 PM  Result Value Ref Range   Glucose-Capillary 85 70 - 99 mg/dL  Glucose, capillary     Status: Abnormal   Collection Time: 06/06/24  9:44 PM  Result Value Ref Range   Glucose-Capillary 108 (H) 70 - 99 mg/dL   Comment 1 Notify RN    Comment 2 Document in Chart   Glucose, capillary     Status: Abnormal   Collection Time: 06/07/24  1:17 AM  Result Value Ref Range   Glucose-Capillary 130 (H) 70 - 99 mg/dL   Comment 1 Notify RN    Comment 2 Document in Chart   Glucose, capillary     Status: Abnormal   Collection Time: 06/07/24  5:15 AM  Result Value Ref Range   Glucose-Capillary 138 (H) 70 - 99 mg/dL   Comment 1 Notify RN    Comment 2 Document in Chart   CBC     Status:  Abnormal   Collection Time: 06/07/24  5:19 AM  Result Value Ref Range   WBC 24.8 (H) 4.0 - 10.5 K/uL   RBC 3.47 (L) 4.22 - 5.81 MIL/uL   Hemoglobin 11.8 (L) 13.0 - 17.0 g/dL   HCT 64.6 (L) 60.9 - 47.9 %   MCV 101.7 (H) 80.0 - 100.0 fL   MCH 34.0 26.0 - 34.0 pg   MCHC 33.4 30.0 - 36.0 g/dL   RDW 86.0 88.4 - 84.4 %   Platelets 153 150 - 400 K/uL   nRBC 0.0 0.0 - 0.2 %  Heparin  level (unfractionated)     Status: None   Collection Time: 06/07/24  5:19 AM  Result Value Ref Range   Heparin  Unfractionated 0.42 0.30 - 0.70 IU/mL  Magnesium      Status: None   Collection Time: 06/07/24  5:19 AM  Result Value Ref Range   Magnesium  2.3 1.7 - 2.4 mg/dL  Comprehensive metabolic panel     Status: Abnormal   Collection Time: 06/07/24  5:19 AM  Result Value Ref Range   Sodium 144 135 - 145 mmol/L   Potassium 3.8 3.5 - 5.1 mmol/L   Chloride 107 98 - 111 mmol/L   CO2 25 22 - 32 mmol/L   Glucose,  Bld 141 (H) 70 - 99 mg/dL   BUN 34 (H) 8 - 23 mg/dL   Creatinine, Ser 9.02 0.61 - 1.24 mg/dL   Calcium  8.7 (L) 8.9 - 10.3 mg/dL   Total Protein 5.2 (L) 6.5 - 8.1 g/dL   Albumin 2.6 (L) 3.5 - 5.0 g/dL   AST 42 (H) 15 - 41 U/L   ALT 31 0 - 44 U/L   Alkaline Phosphatase 138 (H) 38 - 126 U/L   Total Bilirubin 1.0 0.0 - 1.2 mg/dL   GFR, Estimated >39 >39 mL/min   Anion gap 12 5 - 15  Phosphorus     Status: None   Collection Time: 06/07/24  5:19 AM  Result Value Ref Range   Phosphorus 3.1 2.5 - 4.6 mg/dL    Assessment & Plan:  Present on Admission:  SBO (small bowel obstruction) (HCC)  Persistent atrial fibrillation (HCC)  Coronary artery disease involving native coronary artery of native heart without angina pectoris  CLL (chronic lymphocytic leukemia) (HCC)    LOS: 6 days   Additional comments:I reviewed the patient's new clinical lab test results.   and I reviewed the patients new imaging test results.    SBO  - CT 8/21 with high-grade SBO with fluid distention of the stomach and small  intestine to midportion with relative collapse distal to that, cirrhosis of the liver, cholelithiasis without signs of cholecystitis, aortic atherosclerosis, subacute T12 compression fracture, known right hilar adenopathy - Prior abdominal surgery includes appendectomy  - SBO protocol repeated and now with +BM and contrast in colon, but still with SB dilation on XR, trial CLD with NGT clamped.   Extensive discussions from my team as well as palliative care team that patient is high risk for surgery and increased risk for complications. Continue efforts to try to avoid surgery. Even with passage of BM, he may not be completely out of the woods. Repeat AXR in AM.    FEN - IVF per [facility], NGT clamp, CLD. May need to consider starting TPN - defer to primary  VTE - SCDs, heparin  gtt, coumadin  held  ID - no current abx Foley - not present, spont voids   - per [facility] -  HTN HLD Chronic HFrEF - EF 30-35% 06/01/24 Persistent A. 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 I have reviewed this patient's available data, including medical history, events of note, physical examination and test results as part of my evaluation.

## 2024-06-07 NOTE — Plan of Care (Signed)

## 2024-06-07 NOTE — Progress Notes (Signed)
  Progress Note  Patient Name: [name] Date of Encounter: 06/07/2024 [facility] Cardiologist: Lynwood Schilling, MD   Interval Summary   Had BM this AM.  Feels better.  May not be getting surgery. Rates have improved  Vital Signs Vitals:   06/07/24 0517 06/07/24 0851 06/07/24 1030 06/07/24 1156  BP: 137/75 (!) 143/90 (!) 151/81   Pulse: (!) 114  94   Resp: 20  (!) 26   Temp: 97.6 F (36.4 C) (!) 97.4 F (36.3 C)  97.6 F (36.4 C)  TempSrc: Axillary Oral  Oral  SpO2: 94%  93%   Weight:      Height:        Intake/Output Summary (Last 24 hours) at 06/07/2024 1159 Last data filed at 06/07/2024 1117 Gross per 24 hour  Intake 1341.18 ml  Output 2925 ml  Net -1583.82 ml      06/06/2024    2:56 PM 06/01/2024    7:02 PM 05/30/2024    8:07 AM  Last 3 Weights  Weight (lbs) 158 lb 4.6 oz 176 lb 9.4 oz 172 lb  Weight (kg) 71.8 kg 80.1 kg 78.019 kg     Telemetry/ECG  Atrial fibrillation, HR primarily 90-110s with some variance  Physical Exam  GEN: No acute distress.   Neck: No JVD Cardiac: irregularly irregular, rhythm, no murmurs, rubs, or gallops.  Respiratory: Clear to auscultation bilaterally. GI: Soft, nontender, distended  MS: No edema  Assessment & Plan   Permanent atrial fibrillation Episode of VT  Presented with A. Fib with RVR, likely in setting of SBO Started on IV diltiazem  ? became hypotensive  Started on IV amiodarone  ? episode of VT (does not appear to be true torsades per limited CV strips available from prior facility)  Given loading dose digoxin , maintenance not started  Currently in A. Fib with HR 90-100s Remains with frequent ectopy on telemetry  Denies any palpitations, chest pain Keep Mag > 2, K > 4 IV heparin  in place of home warfarin, prior to surgery  Currently on IV Lopressor  7.5 mg Q4h, increased frequency today  Rates have improved a bit with BM and improved pain  Chronic HFmrEF with newly reduced EF  (30-35%) Hypertension Echo: LVEF 30-35% (HR 120-130s), severe BAE, mild MR, dilated IVC Echo from 2018: LVEF 40-45%, mild MR, severe BAE Euvolemic on exam  Currently NPO pending possible surgery for SBO; if need we discussed it was reasonable to proceed  [name] s/p CABG x 3 in 2005  Hyperlipidemia  LIMA ? LAD, SVG ? PDA, SVG ? ramus intermediate  No active chest pain this admission 03/01/2024: HDL 48; LDL Chol Calc (NIH) 92 06/07/2024: ALT 31  Holding home meds until no longer NPO  Per primary SBO  CKD 3a Lactic acidosis  CLL    Reviewed with patient and family: patient co morbidities increase his risk of surgery but they are well compensated save for AF which is partially driven by SBO.  Surgery is reasonable to proceed; they have no cardiac concerns with intervention.  At this point may not need surgery  Stanly Leavens, MD FASE [facility] Cardiologist [facility]  [address] [place], [address] [place], [place] [address] [phone]  11:59 AM

## 2024-06-08 ENCOUNTER — Telehealth (HOSPITAL_COMMUNITY): Payer: Self-pay

## 2024-06-08 ENCOUNTER — Inpatient Hospital Stay (HOSPITAL_COMMUNITY)

## 2024-06-08 ENCOUNTER — Encounter: Payer: Self-pay | Admitting: Oncology

## 2024-06-08 ENCOUNTER — Other Ambulatory Visit (HOSPITAL_COMMUNITY): Payer: Self-pay

## 2024-06-08 DIAGNOSIS — K56609 Unspecified intestinal obstruction, unspecified as to partial versus complete obstruction: Secondary | ICD-10-CM | POA: Diagnosis not present

## 2024-06-08 DIAGNOSIS — D72829 Elevated white blood cell count, unspecified: Secondary | ICD-10-CM | POA: Diagnosis not present

## 2024-06-08 DIAGNOSIS — I5023 Acute on chronic systolic (congestive) heart failure: Secondary | ICD-10-CM | POA: Diagnosis not present

## 2024-06-08 DIAGNOSIS — I4821 Permanent atrial fibrillation: Secondary | ICD-10-CM | POA: Diagnosis not present

## 2024-06-08 DIAGNOSIS — I2510 Atherosclerotic heart disease of native coronary artery without angina pectoris: Secondary | ICD-10-CM | POA: Diagnosis not present

## 2024-06-08 LAB — CBC
HCT: 32.1 % — ABNORMAL LOW (ref 39.0–52.0)
Hemoglobin: 10.4 g/dL — ABNORMAL LOW (ref 13.0–17.0)
MCH: 33.5 pg (ref 26.0–34.0)
MCHC: 32.4 g/dL (ref 30.0–36.0)
MCV: 103.5 fL — ABNORMAL HIGH (ref 80.0–100.0)
Platelets: 121 K/uL — ABNORMAL LOW (ref 150–400)
RBC: 3.1 MIL/uL — ABNORMAL LOW (ref 4.22–5.81)
RDW: 13.8 % (ref 11.5–15.5)
WBC: 17.4 K/uL — ABNORMAL HIGH (ref 4.0–10.5)
nRBC: 0 % (ref 0.0–0.2)

## 2024-06-08 LAB — COMPREHENSIVE METABOLIC PANEL WITH GFR
ALT: 46 U/L — ABNORMAL HIGH (ref 0–44)
AST: 44 U/L — ABNORMAL HIGH (ref 15–41)
Albumin: 2.3 g/dL — ABNORMAL LOW (ref 3.5–5.0)
Alkaline Phosphatase: 119 U/L (ref 38–126)
Anion gap: 8 (ref 5–15)
BUN: 27 mg/dL — ABNORMAL HIGH (ref 8–23)
CO2: 27 mmol/L (ref 22–32)
Calcium: 8.4 mg/dL — ABNORMAL LOW (ref 8.9–10.3)
Chloride: 109 mmol/L (ref 98–111)
Creatinine, Ser: 0.82 mg/dL (ref 0.61–1.24)
GFR, Estimated: >60 mL/min (ref >60)
Glucose, Bld: 136 mg/dL — ABNORMAL HIGH (ref 70–99)
Potassium: 3.8 mmol/L (ref 3.5–5.1)
Sodium: 144 mmol/L (ref 135–145)
Total Bilirubin: 0.6 mg/dL (ref 0.0–1.2)
Total Protein: 4.8 g/dL — ABNORMAL LOW (ref 6.5–8.1)

## 2024-06-08 LAB — HEPARIN LEVEL (UNFRACTIONATED): Heparin Unfractionated: <0.1 [IU]/mL — ABNORMAL LOW (ref 0.30–0.70)

## 2024-06-08 LAB — GLUCOSE, CAPILLARY
Glucose-Capillary: 113 mg/dL — ABNORMAL HIGH (ref 70–99)
Glucose-Capillary: 122 mg/dL — ABNORMAL HIGH (ref 70–99)
Glucose-Capillary: 130 mg/dL — ABNORMAL HIGH (ref 70–99)
Glucose-Capillary: 132 mg/dL — ABNORMAL HIGH (ref 70–99)
Glucose-Capillary: 142 mg/dL — ABNORMAL HIGH (ref 70–99)
Glucose-Capillary: 144 mg/dL — ABNORMAL HIGH (ref 70–99)

## 2024-06-08 LAB — PHOSPHORUS: Phosphorus: 2.8 mg/dL (ref 2.5–4.6)

## 2024-06-08 LAB — MAGNESIUM: Magnesium: 1.9 mg/dL (ref 1.7–2.4)

## 2024-06-08 MED ORDER — MAGNESIUM SULFATE 2 GM/50ML IV SOLN
2.0000 g | Freq: Once | INTRAVENOUS | Status: AC
  Administered 2024-06-08: 2 g via INTRAVENOUS
  Filled 2024-06-08: qty 50

## 2024-06-08 MED ORDER — TRACE MINERALS CU-MN-SE-ZN 300-55-60-3000 MCG/ML IV SOLN
INTRAVENOUS | Status: AC
  Filled 2024-06-08: qty 672

## 2024-06-08 MED ORDER — POTASSIUM CHLORIDE 10 MEQ/100ML IV SOLN: 10.0000 meq | INTRAVENOUS | Status: DC

## 2024-06-08 MED ORDER — ENOXAPARIN SODIUM 80 MG/0.8ML IJ SOSY
1.0000 mg/kg | PREFILLED_SYRINGE | Freq: Two times a day (BID) | INTRAMUSCULAR | Status: DC
  Administered 2024-06-08 – 2024-06-09 (×3): 72.5 mg via SUBCUTANEOUS
  Filled 2024-06-08 (×3): qty 0.8

## 2024-06-08 MED ORDER — POTASSIUM PHOSPHATES 15 MMOLE/5ML IV SOLN
30.0000 mmol | Freq: Once | INTRAVENOUS | Status: AC
  Administered 2024-06-08: 30 mmol via INTRAVENOUS
  Filled 2024-06-08: qty 10

## 2024-06-08 NOTE — Progress Notes (Addendum)
 Progress Note  Patient Name: [name] Date of Encounter: 06/08/2024  Primary Cardiologist: Lynwood Schilling, MD  Subjective   Feeling OK this AM, somewhere in the middle. No CP or SOB. Lying flat without dyspnea. Seems to be tolerating clear liquids.  Inpatient Medications    Scheduled Meds:  artificial tears  1 drop Both Eyes Q6H   Chlorhexidine  Gluconate Cloth  6 each Topical Q0600   digoxin   0.125 mg Intravenous Daily   heparin  lock flush  500 Units Intracatheter Q30 days   insulin  aspart  0-6 Units Subcutaneous Q4H   metoprolol  tartrate  7.5 mg Intravenous Q4H   pantoprazole  (PROTONIX ) IV  40 mg Intravenous Q12H   sodium chloride  flush  10-40 mL Intracatheter Q12H   thiamine  (VITAMIN B1) injection  100 mg Intravenous Q24H   tobramycin   1 drop Both Eyes Q6H   Continuous Infusions:  heparin  1,350 Units/hr (06/07/24 2216)   TPN ADULT (ION) 60 mL/hr at 06/07/24 1749   PRN Meds: fentaNYL  (SUBLIMAZE ) injection, heparin  lock flush **AND** heparin  lock flush, phenol, prochlorperazine , sodium chloride  flush   Vital Signs    Vitals:   06/07/24 1156 06/07/24 1633 06/07/24 2040 06/08/24 0819  BP:  (!) 145/72 (!) 153/65 114/62  Pulse:   98 96  Resp:   16   Temp: 97.6 F (36.4 C) 97.9 F (36.6 C) 97.7 F (36.5 C) 97.9 F (36.6 C)  TempSrc: Oral Oral Oral Oral  SpO2:   96% 95%  Weight:      Height:        Intake/Output Summary (Last 24 hours) at 06/08/2024 0953 Last data filed at 06/08/2024 0820 Gross per 24 hour  Intake 150 ml  Output 2000 ml  Net -1850 ml      06/06/2024    2:56 PM 06/01/2024    7:02 PM 05/30/2024    8:07 AM  Last 3 Weights  Weight (lbs) 158 lb 4.6 oz 176 lb 9.4 oz 172 lb  Weight (kg) 71.8 kg 80.1 kg 78.019 kg     Telemetry    Atrial fib 90s-low 100s, brief infrequent NSVT - Personally Reviewed  Physical Exam   GEN: No acute distress.  HEENT: Normocephalic, atraumatic, sclera non-icteric. Neck: No JVD or bruits. Cardiac: irregularly  irregular, rate ULN, soft SEM, no rubs or gallops.  Respiratory: Clear to auscultation bilaterally. Breathing is unlabored. GI: Soft, nontender, non-distended, BS +x 4. MS: no deformity. Extremities: No clubbing or cyanosis. No edema. Distal pedal pulses are 2+ and equal bilaterally. Neuro:  AAOx3. Follows commands. Psych:  Responds to questions appropriately with a normal affect.  Labs    High Sensitivity Troponin:   Recent Labs  Lab 06/01/24 1000 06/01/24 1206  TROPONINIHS 9 10      Cardiac EnzymesNo results for input(s): TROPONINI in the last 168 hours. No results for input(s): TROPIPOC in the last 168 hours.   Chemistry Recent Labs  Lab 06/02/24 0252 06/02/24 1237 06/06/24 0541 06/07/24 0519 06/08/24 0006  NA 140   < > 143 144 144  K 4.0   < > 4.4 3.8 3.8  CL 105   < > 108 107 109  CO2 26   < > 22 25 27   GLUCOSE 125*   < > 77 141* 136*  BUN 33*   < > 34* 34* 27*  CREATININE 1.03   < > 1.05 0.97 0.82  CALCIUM  8.6*   < > 8.8* 8.7* 8.4*  PROT 5.1*  --   --  5.2* 4.8*  ALBUMIN 2.7*  --   --  2.6* 2.3*  AST 27  --   --  42* 44*  ALT 21  --   --  31 46*  ALKPHOS 74  --   --  138* 119  BILITOT 0.8  --   --  1.0 0.6  GFRNONAA >60   < > >60 >60 >60  ANIONGAP 9   < > 13 12 8    < > = values in this interval not displayed.     Hematology Recent Labs  Lab 06/06/24 0541 06/07/24 0519 06/08/24 0006  WBC 24.6* 24.8* 17.4*  RBC 3.56* 3.47* 3.10*  HGB 12.0* 11.8* 10.4*  HCT 36.4* 35.3* 32.1*  MCV 102.2* 101.7* 103.5*  MCH 33.7 34.0 33.5  MCHC 33.0 33.4 32.4  RDW 13.8 13.9 13.8  PLT 181 153 121*    BNPNo results for input(s): BNP, PROBNP in the last 168 hours.   DDimer No results for input(s): DDIMER in the last 168 hours.   Radiology    DG Chest Port 1 View Result Date: 06/07/2024 CLINICAL DATA:  Status post PICC line placement. EXAM: PORTABLE CHEST 1 VIEW COMPARISON:  06/01/2024 FINDINGS: Right arm PICC line. PICC line tip is near the superior  cavoatrial junction. Left subclavian Port-A-Cath with the tip in the upper SVC. Nasogastric tube extends into the stomach. Subtle patchy densities at the left lung base could represent chronic changes or mild atelectasis. Heart size is stable with post CABG changes. IMPRESSION: 1. Right arm PICC line tip is near the superior cavoatrial junction. 2. No acute chest findings. Electronically Signed   By: Juliene Balder M.D.   On: 06/07/2024 13:23   DG Abd Portable 1V Result Date: 06/07/2024 CLINICAL DATA:  Small bowel obstruction. EXAM: DG ABD PORTABLE 1V COMPARISON:  06/06/2024 FINDINGS: Compared to the prior examination, contrast has moved through the small bowel and now in the colon. Persistent dilated loops of small bowel. Nasogastric tube is present. There is probably residual contrast in the gastric fundus. Limited evaluation for free air on these supine images. Bilateral hip replacements. IMPRESSION: 1. Contrast has moved through the small bowel and now in the colon. 2. Persistent dilated loops of small bowel. Findings could represent a partial small bowel obstruction. Electronically Signed   By: Juliene Balder M.D.   On: 06/07/2024 13:20   US  EKG SITE RITE Result Date: 06/07/2024 If [facility] image not attached, placement could not be confirmed due to current cardiac rhythm.  DG Abd Portable 1V-Small Bowel Obstruction Protocol-initial, 8 hr delay Result Date: 06/07/2024 CLINICAL DATA:  8 hour small-bowel follow-through EXAM: PORTABLE ABDOMEN - 1 VIEW COMPARISON:  Film from the previous day. FINDINGS: Gastric catheter is noted within the stomach. Administered contrast lies within the stomach and proximal mildly dilated small bowel. Persistent small bowel dilatation is noted. No colonic contrast is seen. Bilateral hip replacements are noted. Degenerative change of the lumbar spine is noted. IMPRESSION: Administered contrast lies primarily within the stomach and proximal small bowel. No distal small bowel  visualization is noted. 24 hour film is recommended. Electronically Signed   By: Oneil Devonshire M.D.   On: 06/07/2024 00:14    Cardiac Studies   2d echo 06/01/24    1. Challenging study in the setting of tachycardia, HR 120-130s.   2. Left ventricular ejection fraction, by estimation, is 30 to 35%. The  left ventricle has moderately decreased function. Left ventricular  endocardial border not optimally defined  to evaluate regional wall motion.  Left ventricular diastolic function  could not be evaluated.   3. Right ventricular systolic function was not well visualized. The right  ventricular size is moderately enlarged. There is normal pulmonary artery  systolic pressure.   4. Left atrial size was severely dilated.   5. Right atrial size was severely dilated.   6. The mitral valve is grossly normal. Mild mitral valve regurgitation.  No evidence of mitral stenosis.   7. The aortic valve was not well visualized. Aortic valve regurgitation  is not visualized. No aortic stenosis is present.   8. Aortic dilatation noted. There is mild dilatation of the aortic root,  measuring 40 mm.   9. The inferior vena cava is dilated in size with >50% respiratory  variability, suggesting right atrial pressure of 8 mmHg.   Comparison(s): No prior Echocardiogram.   Patient Profile     89 y.o. 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 Admitted with SBO, leukemoid reaction (felt due to filgrastim), also found to have cirrhosis on imaging and chronic subactive T12 fx. Cardiology following for AF RVR. Due to hypotension was given IV amio on 06/02/24 but developed widening of his QRS complexes concerning for torsades so this was discontinued and switched to digoxin .  Assessment & Plan    1. SBO, leukemoid reaction - s/p  noninvasive measures thus far, tolerating clear liquid diet - per primary teams   2. Permanent atrial fibrillation, ? Torsades, NSVT - with RVR this admission likely driven by physiologic stressors - had hypotension with IV diltiazem , switched to IV amiodarone  but had intermittent widening of QRS complex with torsades appearance (visualized 8/22 AM at [facility]) -> subsequently switched to IV digoxin  and IV metoprolol  which he is tolerating well - continue present regimen, await when patient reliably able to take orals - on IV heparin  per pharmacy for anticoagulation - do not see prior question of DOAC has been broached, historically on warfarin ?discuss transitioning at discharge - lyte mgmt per primary team: recommend keeping K 4.0 or greater, Mg 2.0 or greater  3. Acute on chronic HFrEF - LVEF slightly decreased in context of RVR, from baseline - given lack of anginal symptoms, metastatic CA, advanced age, anticipate symptom based approach - appears euvolemic, poor candidate for invasive interventions  4. [name] s/p CABG 2005 - no recent angina reported - oral home meds on hold until no longer NPO - trops neg  Remainder per primary  For questions or updates, please contact [facility] Please consult [email or web address] for contact info under Cardiology/STEMI.  Signed, Raphael LOISE Bring, PA-C 06/08/2024, 9:53 AM     I have personally seen and examined the patient.  My HPI, Exam, and assessment and plan are below, independent of the NPP above.  Improved to clears with NG removed. Feels better.  Exam notable for  Gen: no distress, Cardiac: No Rubs or Gallops,  Murmur, cardia, +2 radial pulses Respiratory: Clear to auscultation bilaterally, normal effort, normal  respiratory rate Neuro:  At time of evaluation, alert and oriented to person/place/time/situation   Tele: AF with two runs of NSVT and one run of WCT- AF aberrancy suspected  In assessment and plan:  [name] no changes today AF-  rates improved with clinical improvement; on heparin  and IV dig and BB; goal will be for PO transition  when able Asked for price check on DOAC today; historically on coumadin . No plans for HFEF ischemic testing; as outpatient with rates improved LVEF may improve  Stanly Leavens, MD [facility] Cardiologist [facility]  [address] [place], [address] [place], [place] [address] [phone]  12:23 PM

## 2024-06-08 NOTE — Telephone Encounter (Signed)
 Pharmacy Patient Advocate Encounter  Insurance verification completed.    The patient is insured through [facility]. Patient has Medicare and is not eligible for a copay card, but may be able to apply for patient assistance or Medicare RX Payment Plan (Patient Must reach out to their plan, if eligible for payment plan), if available.    Ran test claim for Eliquis  and the current 30 day co-pay is $47.00.  Ran test claim for Xarelto and the current 30 day co-pay is $47.00.  This test claim was processed through  [facility]- copay amounts may vary at other pharmacies due to pharmacy/plan contracts, or as the patient moves through the different stages of their insurance plan.

## 2024-06-08 NOTE — Progress Notes (Signed)
 PHARMACY - TOTAL PARENTERAL NUTRITION CONSULT NOTE   Indication: Small bowel obstruction  Patient Measurements: Height: 5' 6 (167.6 cm) Weight: 71.8 kg (158 lb 4.6 oz) IBW/kg (Calculated) : 63.8 TPN AdjBW (KG): 67.9 Body mass index is 25.55 kg/m. Usual Weight: 77.6 kg per medical oncology 05/08/2024  Assessment:  45 YOM w/ PMH of CLL + squamous cell carcinoma on platinum based chemotherapy who presents with 6+ days of N/V and an inability to take in nutrition. His baseline weight from medical oncology on 05/08/2024 is 77.6 kg. Current weight is 71.8 kg showing a loss of ~6 kg of body weight in a month. He is in need of TPN. Pharmacy consulted to initiate TPN.   Glucose / Insulin : CBG <160, 1 units iSS given in past 24 hours Electrolytes: Na 144, Cl 101097, K 3.8, Ca 8.7 [CoCa 9.82], Phos 2.8, Mag 1.9 Renal: Scr 0.82, BUN 27 Hepatic: Alk Phos 119 / AST 44 / ALT 46, Alb 2.3, TG 108 [03/01/2024] Intake / Output; MIVF: UOP 0.2 ml/kg/hr; NG 1200 mL; LBM 06/08/24  GI Imaging: 8/21 VT abd shows high-grade small bowel obstruction w/ fluid distention of stomach and small intestine to mid-portion of small intestine w/ relative collapse of the small intestine distal to that 8/25 DG abd shows persistent gaseous distention of small bowel GI Surgeries / Procedures:  No recent GI surgeries noted at [facility] or in Care Everywhere  Central access: Patient has Port-a-cath we will utilize TPN start date: 06/06/2024  Nutritional Goals: Goal TPN rate is 75 mL/hr (provides 101 g of protein and 1850 kcals per day)  RD Assessment: Estimated Needs Total Energy Estimated Needs: 1800-2000 kcals Total Protein Estimated Needs: 95-115g Total Fluid Estimated Needs: 1.8-2L/day  Current Nutrition:  NPO  Plan:  Continue TPN at 75 mL/hr at 1800. This will supply 100 % of protein and Kcal needs for the day Electrolytes in TPN: Na 55 mEq/L, K 55 mEq/L, Ca 6 mEq/L, Mg 10 mEq/L, and Phos 20 mmol/L. Cl:Ac  1:2 Give Mg 2g IV x1 and Kphos 30 mmol IV x1 outside of TPN Add standard MVI and trace elements to TPN Thiamine  100 mg IV x5 days d/t risk of refeeding syndrome [8/26 >> 8/30] Initiate Sensitive q4h SSI and adjust as needed Monitor TPN labs on Mon/Thurs, daily x3 and prn  Thank you for allowing pharmacy to be a part of this patient's care.  Shelba Collier, PharmD, BCPS Clinical Pharmacist

## 2024-06-08 NOTE — Plan of Care (Signed)

## 2024-06-08 NOTE — Progress Notes (Signed)
 PROGRESS NOTE        PATIENT DETAILS Name: [name] Age: 89 y.o. Sex: male Date of Birth: 11-16-1931 Admit Date: 06/01/2024 Admitting Physician Alm Schneider, MD [name], [name], MD  Brief Summary: Patient is a 89 y.o.  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  Started on NG tube decompression in the ED-amiodarone  infusion-but reportedly had a episode of torsades while on amiodarone  drip.  Patient was transferred to [facility] for further evaluation.  Significant events: 8/21>> admit to [facility] 8/25>> transferred to [facility]. 8/27>> started having BMs.  Significant studies: 8/21>> CT abdomen/pelvis: High-grade SBO.   8/21>> echo: EF 30-35%. 8/23>> SBO protocol x-ray: SBO. 8/26>> SBO protocol x-ray: Contrast lies in the stomach/proximal small bowel.  Significant microbiology data: 8/21>> urine culture:<10,000 colonies/mL. 8/21>> blood culture: No growth  Procedures: None  Consults: CCS Cards  Subjective: Had BM yesterday-passing flatus-tolerating clear liquids.  Objective: Vitals: Blood pressure 114/62, pulse 96, temperature 97.9 F (36.6 C), temperature source Oral, resp. rate 16, height 5' 6 (1.676 m), weight 71.8 kg, SpO2 95%.   Exam: Gen Exam:Alert awake-not in any distress HEENT:atraumatic, normocephalic Chest: B/L clear to auscultation anteriorly CVS:S1S2 regular Abdomen:soft non tender, non distended Extremities:no edema Neurology: Non focal Skin: no rash  Pertinent Labs/Radiology:    Latest Ref Rng & Units 06/08/2024   12:06 AM 06/07/2024    5:19 AM 06/06/2024    5:41 AM  CBC  WBC 4.0 - 10.5 K/uL 17.4  24.8  24.6   Hemoglobin 13.0 - 17.0 g/dL 89.5  88.1  87.9   Hematocrit 39.0 - 52.0 % 32.1  35.3  36.4   Platelets 150 - 400 K/uL 121  153  181     Lab Results  Component Value Date   NA 144 06/08/2024   K 3.8 06/08/2024   CL 109  06/08/2024   CO2 27 06/08/2024      Assessment/Plan: SBO Thankfully better with just conservative measures-had BM on 8/27 Tolerating clear liquids Looks like patient pulled out NG tube overnight Hopefully she will continue to improve with supportive care Diet being advanced per general surgery. Remain on TNA for now  Leukemoid reaction Secondary to recent filgrastim infusion on 8/20 Not felt to have sepsis/infection issues at this point. [place] phytosis slowly decreasing.  Persistent A-fib with RVR Rate relatively well-controlled Continue digoxin /Lopressor -currently IV but if diet remains stable-could be switched to oral route IV heparin  per pharmacy. Appreciate cardiology assistance.  Torsades This occurred at [facility] 8/23-while on amiodarone  infusion Keep K> 4, Mg> 2.  Chronic HFmrEF with worsening EF (30-35% by echo on 8/21) Volume status reasonable  History of [name] s/p CABG 2005 No anginal symptoms.  HTN BP stable On Lopressor .  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  History of CLL Follows with oncology Under observation.  CKD stage IIIa Close to baseline.  ?Cirrhosis Seen on CT imaging this admission and also on numerous prior CTs-this is a incidental finding Patient is not aware of any formal diagnosis in the past-no history of EtOH use. Currently this appears stable-and further workup can be deferred to the outpatient setting.  Chronic subacute  compression fracture of T12 Incidental finding on CT imaging-Per radiology-this was present in May as well.  Palliative care Frail elderly 89 year old with multiple medical problems-here with SBO-with no resolution in spite of conservative measures-surgical options being considered-high risk for  surgery given numerous medical comorbidities.  DNR in place-palliative care following and engaging with family.  Nutrition Status: Nutrition Problem: Moderate Malnutrition Etiology: chronic illness Signs/Symptoms: mild fat depletion, moderate muscle depletion, severe muscle depletion, percent weight loss Percent weight loss: 7.4 % Interventions: TPN, MVI, Education  Code status:   Code Status: Limited: Do not attempt resuscitation (DNR) -DNR-LIMITED -Do Not Intubate/DNI    DVT Prophylaxis: IV heparin    Family Communication: Daughter at bedside   Disposition Plan: Status is: Inpatient Remains inpatient appropriate because: Severity of illness   Planned Discharge Destination:Home health vs SNF   Diet: Diet Order             Diet full liquid Room service appropriate? Yes; Fluid consistency: Thin  Diet effective now                     Antimicrobial agents: Anti-infectives (From admission, onward)    None        MEDICATIONS: Scheduled Meds:  artificial tears  1 drop Both Eyes Q6H   Chlorhexidine  Gluconate Cloth  6 each Topical Q0600   digoxin   0.125 mg Intravenous Daily   heparin  lock flush  500 Units Intracatheter Q30 days   insulin  aspart  0-6 Units Subcutaneous Q4H   metoprolol  tartrate  7.5 mg Intravenous Q4H   pantoprazole  (PROTONIX ) IV  40 mg Intravenous Q12H   sodium chloride  flush  10-40 mL Intracatheter Q12H   thiamine  (VITAMIN B1) injection  100 mg Intravenous Q24H   tobramycin   1 drop Both Eyes Q6H   Continuous Infusions:  heparin  1,350 Units/hr (06/07/24 2216)   TPN ADULT (ION) 60 mL/hr at 06/07/24 1749   PRN Meds:.fentaNYL  (SUBLIMAZE ) injection, heparin  lock flush **AND** heparin  lock flush, phenol, prochlorperazine , sodium chloride  flush   I have personally reviewed following labs and imaging studies  LABORATORY DATA: CBC: Recent Labs  Lab 06/02/24 0252 06/03/24 0500 06/04/24 0256 06/05/24 0534 06/06/24 0541 06/07/24 0519  06/08/24 0006  WBC 68.5* 58.8* 43.2* 25.9* 24.6* 24.8* 17.4*  NEUTROABS 52.7* 43.9*  --   --   --   --   --   HGB 10.9* 12.1* 12.2* 11.8* 12.0* 11.8* 10.4*  HCT 33.8* 35.9* 36.8* 35.9* 36.4* 35.3* 32.1*  MCV 106.0* 105.9* 104.8* 103.8* 102.2* 101.7* 103.5*  PLT 227 201 197 177 181 153 121*    Basic Metabolic Panel: Recent Labs  Lab 06/03/24 0500 06/03/24 1153 06/04/24 0838 06/05/24 0027 06/06/24 0541 06/07/24 0519 06/08/24 0006  NA 142  --  140 142 143 144 144  K 4.1   < > 4.1 4.1 4.4 3.8 3.8  CL 107  --  102 105 108 107 109  CO2 27  --  24 27 22 25 27   GLUCOSE 104*  --  118* 109* 77 141* 136*  BUN 28*  --  36* 36* 34* 34* 27*  CREATININE 0.87  --  0.93 0.90 1.05 0.97 0.82  CALCIUM  8.4*  --  8.6* 8.8* 8.8* 8.7* 8.4*  MG 2.1   < > 2.3 2.1 1.9 2.3 1.9  PHOS 2.8  --   --   --  3.7 3.1 2.8   < > = values in this interval not displayed.    GFR: Estimated  Creatinine Clearance: 55.1 mL/min (by C-G formula based on SCr of 0.82 mg/dL).  Liver Function Tests: Recent Labs  Lab 06/02/24 0252 06/07/24 0519 06/08/24 0006  AST 27 42* 44*  ALT 21 31 46*  ALKPHOS 74 138* 119  BILITOT 0.8 1.0 0.6  PROT 5.1* 5.2* 4.8*  ALBUMIN 2.7* 2.6* 2.3*   No results for input(s): LIPASE, AMYLASE in the last 168 hours. No results for input(s): AMMONIA in the last 168 hours.  Coagulation Profile: Recent Labs  Lab 06/02/24 0252 06/03/24 0500 06/04/24 0256 06/05/24 0027 06/06/24 0541  INR 2.2* 1.7* 1.6* 1.2 1.2    Cardiac Enzymes: No results for input(s): CKTOTAL, CKMB, CKMBINDEX, TROPONINI in the last 168 hours.  BNP (last 3 results) No results for input(s): PROBNP in the last 8760 hours.  Lipid Profile: No results for input(s): CHOL, HDL, LDLCALC, TRIG, CHOLHDL, LDLDIRECT in the last 72 hours.  Thyroid  Function Tests: No results for input(s): TSH, T4TOTAL, FREET4, T3FREE, THYROIDAB in the last 72 hours.  Anemia Panel: No results for  input(s): VITAMINB12, FOLATE, FERRITIN, TIBC, IRON, RETICCTPCT in the last 72 hours.  Urine analysis:    Component Value Date/Time   COLORURINE YELLOW 06/01/2024 1750   APPEARANCEUR CLEAR 06/01/2024 1750   APPEARANCEUR Clear 05/22/2022 0941   LABSPEC >1.046 (H) 06/01/2024 1750   PHURINE 6.0 06/01/2024 1750   GLUCOSEU NEGATIVE 06/01/2024 1750   HGBUR SMALL (A) 06/01/2024 1750   BILIRUBINUR NEGATIVE 06/01/2024 1750   BILIRUBINUR Negative 05/22/2022 0941   KETONESUR NEGATIVE 06/01/2024 1750   PROTEINUR 30 (A) 06/01/2024 1750   UROBILINOGEN negative 02/14/2015 1247   UROBILINOGEN 0.2 01/21/2015 0850   NITRITE NEGATIVE 06/01/2024 1750   LEUKOCYTESUR NEGATIVE 06/01/2024 1750    Sepsis Labs: Lactic Acid, Venous    Component Value Date/Time   LATICACIDVEN 1.5 06/02/2024 0252    MICROBIOLOGY: Recent Results (from the past 240 hours)  Urine Culture (for pregnant, neutropenic or urologic patients or patients with an indwelling urinary catheter)     Status: Abnormal   Collection Time: 06/01/24  5:50 PM   Specimen: Urine, Clean Catch  Result Value Ref Range Status   Specimen Description   Final    URINE, CLEAN CATCH Performed at [facility], [address], [place], [place] [address]    Special Requests   Final    Immunocompromised Performed at [facility], [address] [place], [place] [address]    Culture (A)  Final    <10,000 COLONIES/mL INSIGNIFICANT GROWTH Performed at [facility], [address], [place], [place] [address]    Report Status 06/02/2024 FINAL  Final  Culture, blood (Routine X 2) w Reflex to ID Panel     Status: None   Collection Time: 06/01/24  6:14 PM   Specimen: BLOOD  Result Value Ref Range Status   Specimen Description BLOOD RIGHT ANTECUBITAL  Final   Special Requests   Final    BOTTLES DRAWN AEROBIC ONLY Blood Culture adequate volume   Culture   Final    NO GROWTH 6 DAYS Performed at [facility], [address]  [place], [place] [address]    Report Status 06/07/2024 FINAL  Final  Culture, blood (Routine X 2) w Reflex to ID Panel     Status: None   Collection Time: 06/01/24  6:14 PM   Specimen: BLOOD  Result Value Ref Range Status   Specimen Description BLOOD RIGHT ANTECUBITAL  Final   Special Requests   Final  BOTTLES DRAWN AEROBIC AND ANAEROBIC Blood Culture results may not be optimal due to an inadequate volume of blood received in culture bottles   Culture   Final    NO GROWTH 6 DAYS Performed at [facility], [address] [place], [place] [address]    Report Status 06/07/2024 FINAL  Final  MRSA Next Gen by PCR, Nasal     Status: None   Collection Time: 06/01/24  6:38 PM   Specimen: Nasal Mucosa; Nasal Swab  Result Value Ref Range Status   MRSA by PCR Next Gen NOT DETECTED NOT DETECTED Final    Comment: (NOTE) The GeneXpert MRSA Assay (FDA approved for NASAL specimens only), is one component of a comprehensive MRSA colonization surveillance program. It is not intended to diagnose MRSA infection nor to guide or monitor treatment for MRSA infections. Test performance is not FDA approved in patients less than 77 years old. Performed at [facility], [address], [place], [place] [address]     RADIOLOGY STUDIES/RESULTS: DG Chest Port 1 View Result Date: 06/07/2024 CLINICAL DATA:  Status post PICC line placement. EXAM: PORTABLE CHEST 1 VIEW COMPARISON:  06/01/2024 FINDINGS: Right arm PICC line. PICC line tip is near the superior cavoatrial junction. Left subclavian Port-A-Cath with the tip in the upper SVC. Nasogastric tube extends into the stomach. Subtle patchy densities at the left lung base could represent chronic changes or mild atelectasis. Heart size is stable with post CABG changes. IMPRESSION: 1. Right arm PICC line tip is near the superior cavoatrial junction. 2. No acute chest findings. Electronically Signed   By: Juliene Balder M.D.   On: 06/07/2024 13:23   DG Abd Portable 1V Result  Date: 06/07/2024 CLINICAL DATA:  Small bowel obstruction. EXAM: DG ABD PORTABLE 1V COMPARISON:  06/06/2024 FINDINGS: Compared to the prior examination, contrast has moved through the small bowel and now in the colon. Persistent dilated loops of small bowel. Nasogastric tube is present. There is probably residual contrast in the gastric fundus. Limited evaluation for free air on these supine images. Bilateral hip replacements. IMPRESSION: 1. Contrast has moved through the small bowel and now in the colon. 2. Persistent dilated loops of small bowel. Findings could represent a partial small bowel obstruction. Electronically Signed   By: Juliene Balder M.D.   On: 06/07/2024 13:20   US  EKG SITE RITE Result Date: 06/07/2024 If [facility] image not attached, placement could not be confirmed due to current cardiac rhythm.  DG Abd Portable 1V-Small Bowel Obstruction Protocol-initial, 8 hr delay Result Date: 06/07/2024 CLINICAL DATA:  8 hour small-bowel follow-through EXAM: PORTABLE ABDOMEN - 1 VIEW COMPARISON:  Film from the previous day. FINDINGS: Gastric catheter is noted within the stomach. Administered contrast lies within the stomach and proximal mildly dilated small bowel. Persistent small bowel dilatation is noted. No colonic contrast is seen. Bilateral hip replacements are noted. Degenerative change of the lumbar spine is noted. IMPRESSION: Administered contrast lies primarily within the stomach and proximal small bowel. No distal small bowel visualization is noted. 24 hour film is recommended. Electronically Signed   By: Oneil Devonshire M.D.   On: 06/07/2024 00:14     LOS: 7 days   Donalda Applebaum, MD  [facility]    To contact the attending provider between 7A-7P or the covering provider during after hours 7P-7A, please log into the web site [email or web address] and access using universal [place] password for that web site. If you do not have the password, please call  the hospital  operator.  06/08/2024, 10:23 AM

## 2024-06-08 NOTE — Progress Notes (Signed)
 Progress Note     Subjective: Pt tolerated CLD and has been having BMs overnight. Has not taken any IV pain or nausea meds and denies significant pain or nausea this AM. His daughter is at bedside.   Objective: Vital signs in last 24 hours: Temp:  [97.6 F (36.4 C)-97.9 F (36.6 C)] 97.9 F (36.6 C) (08/28 0819) Pulse Rate:  [94-98] 96 (08/28 0819) Resp:  [16-26] 16 (08/27 2040) BP: (114-153)/(62-81) 114/62 (08/28 0819) SpO2:  [93 %-96 %] 95 % (08/28 0819) Last BM Date : 06/07/24  Intake/Output from previous day: 08/27 0701 - 08/28 0700 In: 150 [P.O.:120; I.V.:30] Out: 1500 [Urine:300; Emesis/NG output:1200] Intake/Output this shift: Total I/O In: -  Out: 500 [Urine:500]  PE: General: pleasant, WD, chronically ill appearing elderly male who is laying in bed in NAD Heart: HR in the 100s Lungs: Respiratory effort nonlabored Abd: soft, NT, ND, currently on the bedpan Psych: A&Ox3 with an appropriate affect.    Lab Results:  Recent Labs    06/07/24 0519 06/08/24 0006  WBC 24.8* 17.4*  HGB 11.8* 10.4*  HCT 35.3* 32.1*  PLT 153 121*   BMET Recent Labs    06/07/24 0519 06/08/24 0006  NA 144 144  K 3.8 3.8  CL 107 109  CO2 25 27  GLUCOSE 141* 136*  BUN 34* 27*  CREATININE 0.97 0.82  CALCIUM  8.7* 8.4*   PT/INR Recent Labs    06/06/24 0541  LABPROT 15.4*  INR 1.2   CMP     Component Value Date/Time   NA 144 06/08/2024 0006   NA 142 05/22/2022 0955   K 3.8 06/08/2024 0006   CL 109 06/08/2024 0006   CO2 27 06/08/2024 0006   GLUCOSE 136 (H) 06/08/2024 0006   BUN 27 (H) 06/08/2024 0006   BUN 13 05/22/2022 0955   CREATININE 0.82 06/08/2024 0006   CREATININE 1.10 05/08/2013 0938   CALCIUM  8.4 (L) 06/08/2024 0006   PROT 4.8 (L) 06/08/2024 0006   PROT 7.0 05/22/2022 0955   ALBUMIN 2.3 (L) 06/08/2024 0006   ALBUMIN 4.2 05/22/2022 0955   AST 44 (H) 06/08/2024 0006   ALT 46 (H) 06/08/2024 0006   ALKPHOS 119 06/08/2024 0006   BILITOT 0.6 06/08/2024  0006   BILITOT 0.6 05/22/2022 0955   GFRNONAA >60 06/08/2024 0006   GFRNONAA 64 05/08/2013 0938   GFRAA 69 11/27/2020 0852   GFRAA 74 05/08/2013 0938   Lipase     Component Value Date/Time   LIPASE 78 02/23/2020 0933       Studies/Results: DG Chest Port 1 View Result Date: 06/07/2024 CLINICAL DATA:  Status post PICC line placement. EXAM: PORTABLE CHEST 1 VIEW COMPARISON:  06/01/2024 FINDINGS: Right arm PICC line. PICC line tip is near the superior cavoatrial junction. Left subclavian Port-A-Cath with the tip in the upper SVC. Nasogastric tube extends into the stomach. Subtle patchy densities at the left lung base could represent chronic changes or mild atelectasis. Heart size is stable with post CABG changes. IMPRESSION: 1. Right arm PICC line tip is near the superior cavoatrial junction. 2. No acute chest findings. Electronically Signed   By: Juliene Balder M.D.   On: 06/07/2024 13:23   DG Abd Portable 1V Result Date: 06/07/2024 CLINICAL DATA:  Small bowel obstruction. EXAM: DG ABD PORTABLE 1V COMPARISON:  06/06/2024 FINDINGS: Compared to the prior examination, contrast has moved through the small bowel and now in the colon. Persistent dilated loops of small bowel. Nasogastric tube is  present. There is probably residual contrast in the gastric fundus. Limited evaluation for free air on these supine images. Bilateral hip replacements. IMPRESSION: 1. Contrast has moved through the small bowel and now in the colon. 2. Persistent dilated loops of small bowel. Findings could represent a partial small bowel obstruction. Electronically Signed   By: Juliene Balder M.D.   On: 06/07/2024 13:20   US  EKG SITE RITE Result Date: 06/07/2024 If [facility] image not attached, placement could not be confirmed due to current cardiac rhythm.  DG Abd Portable 1V-Small Bowel Obstruction Protocol-initial, 8 hr delay Result Date: 06/07/2024 CLINICAL DATA:  8 hour small-bowel follow-through EXAM: PORTABLE ABDOMEN - 1 VIEW  COMPARISON:  Film from the previous day. FINDINGS: Gastric catheter is noted within the stomach. Administered contrast lies within the stomach and proximal mildly dilated small bowel. Persistent small bowel dilatation is noted. No colonic contrast is seen. Bilateral hip replacements are noted. Degenerative change of the lumbar spine is noted. IMPRESSION: Administered contrast lies primarily within the stomach and proximal small bowel. No distal small bowel visualization is noted. 24 hour film is recommended. Electronically Signed   By: Oneil Devonshire M.D.   On: 06/07/2024 00:14    Anti-infectives: Anti-infectives (From admission, onward)    None        Assessment/Plan SBO  - CT 8/21 with high-grade SBO with fluid distention of the stomach and small intestine to midportion with relative collapse distal to that, cirrhosis of the liver, cholelithiasis without signs of cholecystitis, aortic atherosclerosis, subacute T12 compression fracture, known right hilar adenopathy - Prior abdominal surgery includes open appendectomy  - SBO protocol repeated and now with +BM and contrast in colon on 24h delay, but still with SB dilation on XR - tolerating CLD without pain/nausea and abdomen is soft, having bowel function  - advance to FLD and repeat KUB this AM - if continuing to improve will discuss with MD whether to consider advancing to soft diet this evening  - no indication for emergent surgical intervention at this time but we will continue to follow closely      FEN - TPN, FLD, IVF per [facility] VTE - SCDs, heparin  gtt, coumadin  held  ID - no current abx Foley - not present, spont voids   - per [facility] -  HTN HLD Chronic HFrEF - EF 30-35% 06/01/24 Persistent A. Fib with RVR- developed torsades on amio, cardiology following  [name] s/p CABG Metastatic Squamous cell carcinoma of head and neck - on chemotherapy, last received carboplatin  on 05/30/2024, Pegfilgrastim  was given 05/31/2024 CLL CKD stage  IIIa Anemia of chronic disease GERD DJD   LOS: 7 days   I reviewed Consultant cards, palliative notes, hospitalist notes, last 24 h vitals and pain scores, last 48 h intake and output, last 24 h labs and trends, and last 24 h imaging results.  This care required moderate level of medical decision making.    Burnard JONELLE Louder, [facility] Surgery 06/08/2024, 9:47 AM Please see Amion for pager number during day hours 7:00am-4:30pm

## 2024-06-09 DIAGNOSIS — I5023 Acute on chronic systolic (congestive) heart failure: Secondary | ICD-10-CM | POA: Diagnosis not present

## 2024-06-09 DIAGNOSIS — C9110 Chronic lymphocytic leukemia of B-cell type not having achieved remission: Secondary | ICD-10-CM | POA: Diagnosis not present

## 2024-06-09 DIAGNOSIS — I2510 Atherosclerotic heart disease of native coronary artery without angina pectoris: Secondary | ICD-10-CM | POA: Diagnosis not present

## 2024-06-09 DIAGNOSIS — I5022 Chronic systolic (congestive) heart failure: Secondary | ICD-10-CM | POA: Diagnosis not present

## 2024-06-09 DIAGNOSIS — Z515 Encounter for palliative care: Secondary | ICD-10-CM | POA: Diagnosis not present

## 2024-06-09 DIAGNOSIS — I4821 Permanent atrial fibrillation: Secondary | ICD-10-CM | POA: Diagnosis not present

## 2024-06-09 DIAGNOSIS — D72829 Elevated white blood cell count, unspecified: Secondary | ICD-10-CM | POA: Diagnosis not present

## 2024-06-09 DIAGNOSIS — K56609 Unspecified intestinal obstruction, unspecified as to partial versus complete obstruction: Secondary | ICD-10-CM | POA: Diagnosis not present

## 2024-06-09 LAB — RENAL FUNCTION PANEL
Albumin: 2.1 g/dL — ABNORMAL LOW (ref 3.5–5.0)
Anion gap: 9 (ref 5–15)
BUN: 23 mg/dL (ref 8–23)
CO2: 26 mmol/L (ref 22–32)
Calcium: 8.1 mg/dL — ABNORMAL LOW (ref 8.9–10.3)
Chloride: 105 mmol/L (ref 98–111)
Creatinine, Ser: 0.79 mg/dL (ref 0.61–1.24)
GFR, Estimated: >60 mL/min (ref >60)
Glucose, Bld: 136 mg/dL — ABNORMAL HIGH (ref 70–99)
Phosphorus: 3.6 mg/dL (ref 2.5–4.6)
Potassium: 4 mmol/L (ref 3.5–5.1)
Sodium: 140 mmol/L (ref 135–145)

## 2024-06-09 LAB — CBC
HCT: 30.1 % — ABNORMAL LOW (ref 39.0–52.0)
Hemoglobin: 9.9 g/dL — ABNORMAL LOW (ref 13.0–17.0)
MCH: 34 pg (ref 26.0–34.0)
MCHC: 32.9 g/dL (ref 30.0–36.0)
MCV: 103.4 fL — ABNORMAL HIGH (ref 80.0–100.0)
Platelets: 103 K/uL — ABNORMAL LOW (ref 150–400)
RBC: 2.91 MIL/uL — ABNORMAL LOW (ref 4.22–5.81)
RDW: 13.9 % (ref 11.5–15.5)
WBC: 13.3 K/uL — ABNORMAL HIGH (ref 4.0–10.5)
nRBC: 0 % (ref 0.0–0.2)

## 2024-06-09 LAB — GLUCOSE, CAPILLARY
Glucose-Capillary: 137 mg/dL — ABNORMAL HIGH (ref 70–99)
Glucose-Capillary: 128 mg/dL — ABNORMAL HIGH (ref 70–99)
Glucose-Capillary: 140 mg/dL — ABNORMAL HIGH (ref 70–99)
Glucose-Capillary: 140 mg/dL — ABNORMAL HIGH (ref 70–99)
Glucose-Capillary: 103 mg/dL — ABNORMAL HIGH (ref 70–99)

## 2024-06-09 LAB — MAGNESIUM: Magnesium: 2 mg/dL (ref 1.7–2.4)

## 2024-06-09 LAB — PHOSPHORUS: Phosphorus: 3.6 mg/dL (ref 2.5–4.6)

## 2024-06-09 MED ORDER — APIXABAN 5 MG PO TABS
5.0000 mg | ORAL_TABLET | Freq: Two times a day (BID) | ORAL | Status: DC
  Administered 2024-06-09 – 2024-06-10 (×2): 5 mg via ORAL
  Filled 2024-06-09 (×2): qty 1

## 2024-06-09 MED ORDER — TRACE MINERALS CU-MN-SE-ZN 300-55-60-3000 MCG/ML IV SOLN
INTRAVENOUS | Status: DC
  Filled 2024-06-09: qty 672

## 2024-06-09 MED ORDER — DIGOXIN 125 MCG PO TABS
0.1250 mg | ORAL_TABLET | Freq: Every day | ORAL | Status: DC
  Administered 2024-06-09 – 2024-06-10 (×2): 0.125 mg via ORAL
  Filled 2024-06-09 (×2): qty 1

## 2024-06-09 MED ORDER — TRACE MINERALS CU-MN-SE-ZN 300-55-60-3000 MCG/ML IV SOLN
INTRAVENOUS | Status: DC
  Filled 2024-06-09: qty 358.4

## 2024-06-09 MED ORDER — METOPROLOL TARTRATE 50 MG PO TABS
50.0000 mg | ORAL_TABLET | Freq: Two times a day (BID) | ORAL | Status: DC
  Administered 2024-06-09 (×2): 50 mg via ORAL
  Filled 2024-06-09 (×2): qty 1

## 2024-06-09 NOTE — Progress Notes (Signed)
 PHARMACY - ANTICOAGULATION CONSULT NOTE  Pharmacy Consult for treatment dose Lovenox  > Eliquis  Indication: atrial fibrillation  Allergies  Allergen Reactions   Penicillins Hives and Rash    Has patient had a PCN reaction causing immediate rash, facial/tongue/throat swelling, SOB or lightheadedness with hypotension: Yes Has patient had a PCN reaction causing severe rash involving mucus membranes or skin necrosis: Yes Has patient had a PCN reaction that required hospitalization: No Has patient had a PCN reaction occurring within the last 10 years: No If all of the above answers are NO, then may proceed with Cephalosporin use.    Cetuximab  Cough    Cough and scratchy throat. Drug rechallenged and pt able to tolerated the rest of the infusion without any complications.    Hct [Hydrochlorothiazide] Other (See Comments)    hyper   Statins Other (See Comments)    Myalgia, tolerates low dosages of lovastatin      Patient Measurements: Height: 5' 6 (167.6 cm) Weight: 75.3 kg (166 lb 0.1 oz) IBW/kg (Calculated) : 63.8 HEPARIN  DW (KG): 79.9  Vital Signs: Temp: 99.1 F (37.3 C) (08/29 0753) Temp Source: Oral (08/29 0753) BP: 130/65 (08/29 0753) Pulse Rate: 94 (08/29 0753)  Labs: Recent Labs    06/07/24 0519 06/08/24 0006 06/09/24 0302  HGB 11.8* 10.4* 9.9*  HCT 35.3* 32.1* 30.1*  PLT 153 121* 103*  HEPARINUNFRC 0.42 <0.10*  --   CREATININE 0.97 0.82 0.79    Estimated Creatinine Clearance: 56.5 mL/min (by C-G formula based on SCr of 0.79 mg/dL).  Assessment: 89 yo male presented to ED on 8/21 with complaint of abdominal pain, nausea, and vomiting.  Patient admitted for treatment of small bowel obstruction.  Patient anticoagulated with Warfarin for Afib prior to admission, which was held.  Pharmacy previously consulted for heparin  infusion > changed to therapeutic Lovenox  on 8/18 and now to transition to Eliquis  as tolerating diet. Last Lovenox  dose given at 0905 this am.  89  yrs old, but weight > 60 and and creatinine <1.5, so does note need Eliquis  dose adjustment.  Hemoglobin and platelet count have both trended down.     Goal of Therapy:  Appropriate Eliquis  regimen for indication Monitor platelets by anticoagulation protocol: Yes   Plan:  Discontinue Lovenox . Begin Eliquis  5 mg PO BID tonight. Follow up CBC. Monitor for signs/symptoms of bleeding.  Genaro Zebedee Calin, RPh 06/09/2024,10:58 AM

## 2024-06-09 NOTE — Progress Notes (Addendum)
 PHARMACY - TOTAL PARENTERAL NUTRITION CONSULT NOTE   Indication: Small bowel obstruction  Patient Measurements: Height: 5' 6 (167.6 cm) Weight: 75.3 kg (166 lb 0.1 oz) IBW/kg (Calculated) : 63.8 TPN AdjBW (KG): 67.9 Body mass index is 26.79 kg/m. Usual Weight: 77.6 kg per medical oncology 05/08/2024  Assessment:  30 YOM w/ PMH of CLL + squamous cell carcinoma on platinum based chemotherapy who presents with 6+ days of N/V and an inability to take in nutrition. His baseline weight from medical oncology on 05/08/2024 is 77.6 kg. Current weight is 71.8 kg showing a loss of ~6 kg of body weight in a month. He is in need of TPN. Pharmacy consulted to initiate TPN.   Glucose / Insulin : CBG <160, zero iSS given in past 24 hours Electrolytes: Na 140, Cl 105, K 4.0, Ca 8.1 [CoCa 9.62], Phos 3.6, Mag 2.0 Renal: Scr 0.79, BUN 23 Hepatic: Alk Phos 119 / AST 44 / ALT 46, Alb 2.1, TG 108 [03/01/2024] Intake / Output; MIVF: UOP 0.72 ml/kg/hr; unmeasured stool 2+;LBM 06/09/24  GI Imaging: 8/21 VT abd shows high-grade small bowel obstruction w/ fluid distention of stomach and small intestine to mid-portion of small intestine w/ relative collapse of the small intestine distal to that 8/25 DG abd shows persistent gaseous distention of small bowel GI Surgeries / Procedures:  No recent GI surgeries noted at [facility] health or in Care Everywhere  Central access: Patient has Port-a-cath we will utilize TPN start date: 06/06/2024  Nutritional Goals: Goal TPN rate is 75 mL/hr (provides 101 g of protein and 1850 kcals per day)  RD Assessment: Estimated Needs Total Energy Estimated Needs: 1800-2000 kcals Total Protein Estimated Needs: 95-115g Total Fluid Estimated Needs: 1.8-2L/day  Current Nutrition:  NPO  Plan:  Wean TPN at 40 mL/hr at 1800. This will supply ~50% % of protein and Kcal needs for the day DC TPN tomorrow at time of DC Electrolytes in TPN: Na 55 mEq/L, K 55 mEq/L, Ca 6 mEq/L, Mg 10 mEq/L,  and Phos 20 mmol/L. Cl:Ac 1:2 Add standard MVI and trace elements to TPN Thiamine  100 mg IV x5 days d/t risk of refeeding syndrome [8/26 >> 8/30] Initiate Sensitive q4h SSI and adjust as needed Monitor TPN labs on Mon/Thurs, daily x3 and prn  Thank you for allowing pharmacy to be a part of this patient's care.  Benedetta Heath BS, PharmD, BCPS Clinical Pharmacist 06/09/2024 9:18 AM  Contact: [phone] after 3 PM

## 2024-06-09 NOTE — Progress Notes (Signed)
 Nutrition Follow-up  DOCUMENTATION CODES:   Non-severe (moderate) malnutrition in context of chronic illness  INTERVENTION:  Wean parenteral nutrition as bowels are moving and pt tolerating oral diet  Transition to oral thiamine  100mg  x3 more days    Monitor for diet advancement/tolerance          Transition to oral MVI w/ minerals   Add chocolate milk to meal trays  Assistance with ordering meal trays to maximize intake  Add Magic cup TID with meals, each supplement provides 290 kcal and 9 grams of protein   NUTRITION DIAGNOSIS:  Moderate Malnutrition related to chronic illness as evidenced by mild fat depletion, moderate muscle depletion, severe muscle depletion, percent weight loss.   GOAL:  Patient will meet greater than or equal to 90% of their needs  MONITOR:  Diet advancement, TF tolerance, Weight trends, PO intake  REASON FOR ASSESSMENT:  Consult New TPN/TNA  ASSESSMENT:   Pt with PMH significant for: [name], HTN, HLD, afib, SCC of neck, and CLL. Presented to [facility] with N/V x2 days as well as abdominal pain. Found to have SBO and transferred to [facility].  Prior to Admission 8/19 received carboplatin  infusion for cancer 8/20 patient received pegfilgrastim ; last BM Current Admission 8/21 admitted to [facility]; Echo: EF 30-35% 8/23 SB study - SBO persists 8/25 NGT placed to suction; txr to [facility] for SBO management by surgery  8/26 abd xray: contrast in stomach/proximal small bowel 8/27 bowels function returned; advanced to clear liquid diet 8/28 bowels moved; advanced to GI soft diet; NGT removed  8/29 wean TPN  Patient resting in bed at time of assessment. Daughter at bedside. Patient tolerating a soft diet. Reported consuming all of his bacon for breakfast. Does not prefer eggs or nutrition supplements. Discussed need for increased protein intake orally, now that TPN is being weaned. They both verbalize understanding. He is amicable to chocolate milk on meal trays. Daughter  asking if he can have salads. Discussed GI soft diet indication and advancement.   Admit Weight: 80.1 kg Current Weight: 75.3 kg  Weight down this admission. Expected d/t prolonged NPO status. No significant edema on exam. Bowels now moving.    Drains/Lines: R basilic: PICC (placed 8/27) UOP: 1300 ml x24 hours  BUN/Crt improved. Refeeding labs stable. Continue thiamine  and MVI orally now that TPN is being weaned. Has required potassium phosphate  repletion.   Meds: SS Novolog  0-6 q4h Pantoprazole  Thiamine  30mmol potassium phosphate  x1   Labs:  Na+ 140 (wdl) K+ 4.0 (wdl) WBC 24.8>17.4>13.3 (H) CBGs 136-141 x48 hours   Diet Order:   Diet Order             DIET SOFT Room service appropriate? Yes; Fluid consistency: Thin  Diet effective now             EDUCATION NEEDS:  Education needs have been addressed  Skin:  Skin Assessment: Reviewed RN Assessment  Last BM:  PTA 8/20  Height:  Ht Readings from Last 1 Encounters:  06/01/24 5' 6 (1.676 m)   Weight:  Wt Readings from Last 1 Encounters:  06/09/24 75.3 kg   Ideal Body Weight:  64.5 kg  BMI:  Body mass index is 26.79 kg/m.  Estimated Nutritional Needs:   Kcal:  1800-2000 kcals  Protein:  95-115g  Fluid:  1.8-2L/day  Blair Deaner MS, RD, LDN Registered Dietitian Clinical Nutrition RD Inpatient Contact Info in Amion

## 2024-06-09 NOTE — Plan of Care (Signed)

## 2024-06-09 NOTE — Evaluation (Addendum)
 Occupational Therapy Evaluation Patient Details Name: [name] MRN: [id number] DOB: March 22, 1932 Today's Date: 06/09/2024   History of Present Illness   Patient is an 89 yo male presenting to the ED with coffee ground emesis on 06/01/24. Admitted with SBO and NG tube placed on 8/21. Noted to be in A-fib with RVR. 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 Patient's wife drove and completes household IADLs. Patient has a RW, but does not typically need to use it Currently, patient requiring min A for transfers and ADLs, and noted HR up to 151 when ambulating. BP stable throughout session. OT recommending HHOT at discharge when appropriate, OT will continue to follow acutely.   HR up to 151 with mobility BP in sitting: 135/70 (80) BP in standing: 135/100 (112)     If plan is discharge home, recommend the following:   A little help with walking and/or transfers;A little help with bathing/dressing/bathroom;Assistance with cooking/housework;Assist for transportation;Help with stairs or ramp for entrance     Functional Status Assessment   Patient has had a recent decline in their functional status and demonstrates the ability to make significant improvements in function in a reasonable and predictable amount of time.     Equipment Recommendations   None recommended by OT     Recommendations for Other Services         Precautions/Restrictions   Precautions Precautions: Fall Restrictions Weight Bearing Restrictions Per Provider Order: No     Mobility Bed Mobility Overal bed mobility: Needs Assistance Bed Mobility: Supine to Sit     Supine to sit: Min assist     General bed mobility comments: Min A to pull up,  using bed rails and PT for assist    Transfers Overall transfer level: Needs assistance Equipment used: Rolling walker (2 wheels) Transfers: Sit to/from Stand Sit to Stand: Min assist           General transfer comment: Cues for hand placement, min A to come into standing, noted fatigue in static standing and able to twalk short distance around bed to recliner      Balance Overall balance assessment: Needs assistance Sitting-balance support: Bilateral upper extremity supported, Feet supported Sitting balance-Leahy Scale: Good     Standing balance support: Bilateral upper extremity supported, Reliant on assistive device for balance, During functional activity Standing balance-Leahy Scale: Poor Standing balance comment: Reliant on UE support                           ADL either performed or assessed with clinical judgement   ADL Overall ADL's : Needs assistance/impaired Eating/Feeding: Set up;Sitting   Grooming: Set up;Sitting   Upper Body Bathing: Contact guard assist;Sitting   Lower Body Bathing: Contact guard assist;Sitting/lateral leans;Sit to/from stand   Upper Body Dressing : Contact guard assist;Sitting   Lower Body Dressing: Contact guard assist;Sit to/from stand;Sitting/lateral leans   Toilet Transfer: Minimal assistance;Ambulation;Regular Teacher, adult education Details (indicate cue type and reason): simulated to recliner Toileting- Clothing Manipulation and Hygiene: Total assistance;Sit to/from stand;Sitting/lateral lean Toileting - Clothing Manipulation Details (indicate cue type and reason): total A in standing (unaware of BM)     Functional mobility during ADLs: Minimal assistance;Cueing for sequencing;Cueing for safety;Rolling  walker (2 wheels) General ADL Comments: Prior to this admission, patient living with spouse, walking short distances, and able to complete ADLs independently. Patient's wife drove and completes household IADLs. Patient  has a RW, but does not typically need to use it Currently, patient requiring min A for transfers and ADLs, and noted HR up to 151 when ambulating. BP stable throughout session. OT recommending HHOT at discharge when appropriate, OT will continue to follow acutely.     Vision Baseline Vision/History: 0 No visual deficits Ability to See in Adequate Light: 0 Adequate Patient Visual Report: No change from baseline Vision Assessment?: No apparent visual deficits     Perception Perception: Not tested       Praxis Praxis: Not tested       Pertinent Vitals/Pain Pain Assessment Pain Assessment: No/denies pain     Extremity/Trunk Assessment Upper Extremity Assessment Upper Extremity Assessment: Right hand dominant;Overall [facility] for tasks assessed   Lower Extremity Assessment Lower Extremity Assessment: Defer to PT evaluation   Cervical / Trunk Assessment Cervical / Trunk Assessment: Kyphotic (minimally)   Communication Communication Communication: No apparent difficulties   Cognition Arousal: Alert Behavior During Therapy: WFL for tasks assessed/performed Cognition: Cognition impaired   Orientation impairments: Place Awareness: Online awareness intact, Intellectual awareness intact Memory impairment (select all impairments): Short-term memory Attention impairment (select first level of impairment): Selective attention Executive functioning impairment (select all impairments): Sequencing OT - Cognition Comments: Minimal STM deficits                 Following commands: Intact       Cueing  General Comments   Cueing Techniques: Verbal cues  HR at 151 with ambulation, BP stable   Exercises     Shoulder Instructions      Home Living Family/patient expects to be discharged to:: Private residence Living Arrangements: Spouse/significant other Available Help at Discharge: Available PRN/intermittently Type of Home: House Home Access: Level entry     Home Layout:  One level     Bathroom Shower/Tub: Producer, television/film/video: Handicapped height     Home Equipment: Agricultural consultant (2 wheels);Grab bars - toilet;Shower seat          Prior Functioning/Environment Prior Level of Function : History of Falls (last six months);Independent/Modified Independent             Mobility Comments: occasionally uses a RW ADLs Comments: independent, wife drives and does household IADLs    OT Problem List: Decreased strength;Decreased activity tolerance;Impaired balance (sitting and/or standing);Cardiopulmonary status limiting activity   OT Treatment/Interventions: Self-care/ADL training;Therapeutic exercise;Energy conservation;DME and/or AE instruction;Manual therapy;Therapeutic activities;Patient/family education;Balance training      OT Goals(Current goals can be found in the care plan section)   Acute Rehab OT Goals Patient Stated Goal: to get better OT Goal Formulation: With patient Time For Goal Achievement: 06/23/24 Potential to Achieve Goals: Good ADL Goals Pt Will Perform Lower Body Bathing: with modified independence;sit to/from stand;sitting/lateral leans Pt Will Perform Lower Body Dressing: with modified independence;sit to/from stand;sitting/lateral leans Pt Will Transfer to Toilet: with modified independence;ambulating;regular height toilet Pt Will Perform Toileting - Clothing Manipulation and hygiene: with modified independence;sitting/lateral leans;sit to/from stand Additional ADL Goal #1: Patient will be able to complete functional task in standing for 3 minutes prior to fatigue or needing seated rest break in order to increase overall activity tolerance.   OT Frequency:  Min 2X/week    Co-evaluation  AM-PAC OT 6 Clicks Daily Activity     Outcome Measure Help from another person eating meals?: A Little Help from another person taking care of personal grooming?: A Little Help from another person  toileting, which includes using toliet, bedpan, or urinal?: A Lot Help from another person bathing (including washing, rinsing, drying)?: A Little Help from another person to put on and taking off regular upper body clothing?: A Little Help from another person to put on and taking off regular lower body clothing?: A Little 6 Click Score: 17   End of Session Equipment Utilized During Treatment: Gait belt;Rolling walker (2 wheels) Nurse Communication: Mobility status  Activity Tolerance: Patient tolerated treatment well Patient left: in chair;with call bell/phone within reach;with chair alarm set;with family/visitor present  OT Visit Diagnosis: Unsteadiness on feet (R26.81);Other abnormalities of gait and mobility (R26.89);History of falling (Z91.81)                Time: 9058-8995 OT Time Calculation (min): 23 min Charges:  OT General Charges $OT Visit: 1 Visit OT Evaluation $OT Eval Moderate Complexity: 1 Mod  Ronal Gift E. Kassy Mcenroe, OTR/L Acute Rehabilitation Services [phone]   Ronal Gift Salt 06/09/2024, 10:27 AM

## 2024-06-09 NOTE — Discharge Instructions (Signed)

## 2024-06-09 NOTE — Evaluation (Signed)
 Physical Therapy Evaluation Patient Details Name: [name] MRN: [id number] DOB: 1932-07-16 Today's Date: 06/09/2024  History of Present Illness  Patient is an 89 yo male presenting to the ED with coffee ground emesis on 06/01/24. Admitted with SBO and NG tube placed on 8/21. Noted to be in A-fib with RVR. 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  Currently min A for mobility in the room around the bed to recliner (with HR up to 151 with ambulation).  Previously independent at home for ADL and spouse assist with IADL.  Feel patient will continue to benefit from skilled PT in the acute setting and from HHPT at d/c.         If plan is discharge home, recommend the following: A little help with walking and/or transfers;A little help with bathing/dressing/bathroom;Assist for transportation;Assistance with cooking/housework   Can travel by private vehicle        Equipment Recommendations None recommended by PT  Recommendations for Other Services       Functional Status Assessment Patient has had a recent decline in their functional status and demonstrates the ability to make significant improvements in function in a reasonable and predictable amount of time.     Precautions / Restrictions Precautions Precautions: Fall Recall of Precautions/Restrictions: Intact      Mobility  Bed Mobility Overal bed mobility: Needs Assistance Bed Mobility: Supine to Sit     Supine to sit: Min assist     General bed mobility comments: Min A to pull up, using bed rails and PT for assist    Transfers Overall transfer level: Needs assistance Equipment used: Rolling walker (2 wheels) Transfers: Sit  to/from Stand Sit to Stand: Min assist           General transfer comment: Cues for hand placement, min A to come into standing, noted fatigue in static standing and able to twalk short distance around bed to recliner    Ambulation/Gait Ambulation/Gait assistance: Contact guard assist Gait Distance (Feet): 10 Feet Assistive device: Rolling walker (2 wheels) Gait Pattern/deviations: Step-to pattern, Step-through pattern, Decreased stride length       General Gait Details: around bed to chair only with HR in 140's-150's though pt denies symptoms  Stairs            Wheelchair Mobility     Tilt Bed    Modified Rankin (Stroke Patients Only)       Balance Overall balance assessment: Needs assistance Sitting-balance support: Bilateral upper extremity supported, Feet supported Sitting balance-Leahy Scale: Good     Standing balance support: Bilateral upper extremity supported, Reliant on assistive device for balance, During functional activity Standing balance-Leahy Scale: Poor Standing balance comment: Reliant on UE support                             Pertinent Vitals/Pain Pain Assessment Pain Assessment: No/denies pain    Home Living Family/patient expects to be discharged to:: Private residence Living Arrangements: Spouse/significant other Available Help at Discharge: Available PRN/intermittently Type of Home: House Home Access: Level entry       Home Layout: One level Home Equipment: Agricultural consultant (2 wheels);Grab bars - toilet;Shower seat  Prior Function Prior Level of Function : History of Falls (last six months);Independent/Modified Independent             Mobility Comments: occasionally uses a RW ADLs Comments: independent, wife drives and does household IADLs     Extremity/Trunk Assessment        Lower Extremity Assessment Lower Extremity Assessment: Generalized weakness    Cervical / Trunk Assessment Cervical /  Trunk Assessment: Kyphotic  Communication   Communication Communication: No apparent difficulties    Cognition Arousal: Alert Behavior During Therapy: WFL for tasks assessed/performed   PT - Cognitive impairments: No apparent impairments                                 Cueing Cueing Techniques: Verbal cues     General Comments General comments (skin integrity, edema, etc.): HR max 151, BP stable    Exercises     Assessment/Plan    PT Assessment Patient needs continued PT services  PT Problem List Decreased activity tolerance       PT Treatment Interventions DME instruction;Gait training;Functional mobility training;Therapeutic activities;Therapeutic exercise;Balance training;Patient/family education    PT Goals (Current goals can be found in the Care Plan section)  Acute Rehab PT Goals Patient Stated Goal: return home PT Goal Formulation: With patient/family Time For Goal Achievement: 06/23/24 Potential to Achieve Goals: Good    Frequency Min 2X/week     Co-evaluation PT/OT/SLP Co-Evaluation/Treatment: Yes Reason for Co-Treatment: For patient/therapist safety PT goals addressed during session: Mobility/safety with mobility;Balance;Proper use of DME         AM-PAC PT 6 Clicks Mobility  Outcome Measure Help needed turning from your back to your side while in a flat bed without using bedrails?: A Little Help needed moving from lying on your back to sitting on the side of a flat bed without using bedrails?: A Little Help needed moving to and from a bed to a chair (including a wheelchair)?: A Little Help needed standing up from a chair using your arms (e.g., wheelchair or bedside chair)?: A Little Help needed to walk in hospital room?: A Little Help needed climbing 3-5 steps with a railing? : Total 6 Click Score: 16    End of Session Equipment Utilized During Treatment: Gait belt Activity Tolerance: Treatment limited secondary to medical  complications (Comment) (tachycardia) Patient left: in chair;with call bell/phone within reach;with chair alarm set   PT Visit Diagnosis: Muscle weakness (generalized) (M62.81);History of falling (Z91.81)    Time: 9058-8995 PT Time Calculation (min) (ACUTE ONLY): 23 min   Charges:   PT Evaluation $PT Eval Moderate Complexity: 1 Mod   PT General Charges $$ ACUTE PT VISIT: 1 Visit         Micheline Portal, PT Acute Rehabilitation Services Office:[phone] 06/09/2024   Montie Portal 06/09/2024, 1:19 PM

## 2024-06-09 NOTE — Progress Notes (Addendum)
 Progress Note  Patient Name: Greg Greg Date of Encounter: 06/09/2024  Primary Cardiologist: Lynwood Schilling, MD  Subjective   Feeling good. No CP, SOB. Notes sensation of phlegm in his throat at times. No nausea or abd pain.  Inpatient Medications    Scheduled Meds:  artificial tears  1 drop Both Eyes Q6H   Chlorhexidine  Gluconate Cloth  6 each Topical Q0600   digoxin   0.125 mg Intravenous Daily   enoxaparin  (LOVENOX ) injection  1 mg/kg Subcutaneous BID   heparin  lock flush  500 Units Intracatheter Q30 days   insulin  aspart  0-6 Units Subcutaneous Q4H   metoprolol  tartrate  7.5 mg Intravenous Q4H   pantoprazole  (PROTONIX ) IV  40 mg Intravenous Q12H   sodium chloride  flush  10-40 mL Intracatheter Q12H   thiamine  (VITAMIN B1) injection  100 mg Intravenous Q24H   tobramycin   1 drop Both Eyes Q6H   Continuous Infusions:  TPN ADULT (ION) 75 mL/hr at 06/08/24 1808   PRN Meds: fentaNYL  (SUBLIMAZE ) injection, heparin  lock flush **AND** heparin  lock flush, phenol, prochlorperazine , sodium chloride  flush   Vital Signs    Vitals:   06/08/24 2340 06/09/24 0400 06/09/24 0433 06/09/24 0753  BP: 123/61 (!) 150/64  130/65  Pulse: 75 90  94  Resp: (!) 25 (!) 22  (!) 22  Temp:  98.8 F (37.1 C)  99.1 F (37.3 C)  TempSrc:  Oral  Oral  SpO2: 95% 96%  95%  Weight:   75.3 kg   Height:        Intake/Output Summary (Last 24 hours) at 06/09/2024 0831 Last data filed at 06/09/2024 0400 Gross per 24 hour  Intake 715.22 ml  Output 800 ml  Net -84.78 ml      06/09/2024    4:33 AM 06/06/2024    2:56 PM 06/01/2024    7:02 PM  Last 3 Weights  Weight (lbs) 166 lb 0.1 oz 158 lb 4.6 oz 176 lb 9.4 oz  Weight (kg) 75.3 kg 71.8 kg 80.1 kg     Telemetry    Atrial fib, rates 80s-low 100s primarily - Personally Reviewed  Physical Exam   GEN: No acute distress.  HEENT: Normocephalic, atraumatic, sclera non-icteric. Neck: No JVD or bruits. Cardiac: Irregularly irregular, rate ~90s, soft  SEM, no rubs or gallops.  Respiratory: Clear to auscultation bilaterally. Breathing is unlabored. GI: Soft, nontender, non-distended, BS +x 4. MS: no deformity. Extremities: No clubbing or cyanosis. No edema. Distal pedal pulses are 2+ and equal bilaterally. Neuro:  AAOx3. Follows commands. Psych:  Responds to questions appropriately with a normal affect.  Labs    High Sensitivity Troponin:   Recent Labs  Lab 06/01/24 1000 06/01/24 1206  TROPONINIHS 9 10       Chemistry Recent Labs  Lab 06/07/24 0519 06/08/24 0006 06/09/24 0302  NA 144 144 140  K 3.8 3.8 4.0  CL 107 109 105  CO2 25 27 26   GLUCOSE 141* 136* 136*  BUN 34* 27* 23  CREATININE 0.97 0.82 0.79  CALCIUM  8.7* 8.4* 8.1*  PROT 5.2* 4.8*  --   ALBUMIN 2.6* 2.3* 2.1*  AST 42* 44*  --   ALT 31 46*  --   ALKPHOS 138* 119  --   BILITOT 1.0 0.6  --   GFRNONAA >60 >60 >60  ANIONGAP 12 8 9      Hematology Recent Labs  Lab 06/07/24 0519 06/08/24 0006 06/09/24 0302  WBC 24.8* 17.4* 13.3*  RBC 3.47* 3.10* 2.91*  HGB 11.8* 10.4* 9.9*  HCT 35.3* 32.1* 30.1*  MCV 101.7* 103.5* 103.4*  MCH 34.0 33.5 34.0  MCHC 33.4 32.4 32.9  RDW 13.9 13.8 13.9  PLT 153 121* 103*    BNPNo results for input(s): BNP, PROBNP in the last 168 hours.   DDimer No results for input(s): DDIMER in the last 168 hours.   Radiology    DG Abd Portable 1V Result Date: 06/08/2024 CLINICAL DATA:  [id number] SBO (small bowel obstruction) (HCC) [id number] EXAM: PORTABLE ABDOMEN - 1 VIEW COMPARISON:  06/07/2024. FINDINGS: There is continued forward propagation of positive contrast with near complete clearing of contrast in the small bowel loops. There are a few mildly dilated small bowel loops overall appears less distended than the prior radiograph. Findings favor improving partial small bowel obstruction. No evidence of pneumoperitoneum, within the limitations of a supine film. No acute osseous abnormalities. The soft tissues are within normal  limits. Surgical changes, devices, tubes and lines: Bilateral hip arthroplasty noted. IMPRESSION: *Findings favoring improving partial small bowel obstruction. Electronically Signed   By: Ree Molt M.D.   On: 06/08/2024 10:38   DG Chest Port 1 View Result Date: 06/07/2024 CLINICAL DATA:  Status post PICC line placement. EXAM: PORTABLE CHEST 1 VIEW COMPARISON:  06/01/2024 FINDINGS: Right arm PICC line. PICC line tip is near the superior cavoatrial junction. Left subclavian Port-A-Cath with the tip in the upper SVC. Nasogastric tube extends into the stomach. Subtle patchy densities at the left lung base could represent chronic changes or mild atelectasis. Heart size is stable with post CABG changes. IMPRESSION: 1. Right arm PICC line tip is near the superior cavoatrial junction. 2. No acute chest findings. Electronically Signed   By: Juliene Balder M.D.   On: 06/07/2024 13:23   DG Abd Portable 1V Result Date: 06/07/2024 CLINICAL DATA:  Small bowel obstruction. EXAM: DG ABD PORTABLE 1V COMPARISON:  06/06/2024 FINDINGS: Compared to the prior examination, contrast has moved through the small bowel and now in the colon. Persistent dilated loops of small bowel. Nasogastric tube is present. There is probably residual contrast in the gastric fundus. Limited evaluation for free air on these supine images. Bilateral hip replacements. IMPRESSION: 1. Contrast has moved through the small bowel and now in the colon. 2. Persistent dilated loops of small bowel. Findings could represent a partial small bowel obstruction. Electronically Signed   By: Juliene Balder M.D.   On: 06/07/2024 13:20   US  EKG SITE RITE Result Date: 06/07/2024 If [facility] image not attached, placement could not be confirmed due to current cardiac rhythm.   Cardiac Studies   2D echo 06/01/24  1. Challenging study in the setting of tachycardia, HR 120-130s.   2. Left ventricular ejection fraction, by estimation, is 30 to 35%. The left ventricle has  moderately decreased function. Left ventricular  endocardial border not optimally defined to evaluate regional wall motion. Left ventricular diastolic function could not be evaluated.   3. Right ventricular systolic function was not well visualized. The right ventricular size is moderately enlarged. There is normal pulmonary artery  systolic pressure.   4. Left atrial size was severely dilated.   5. Right atrial size was severely dilated.   6. The mitral valve is grossly normal. Mild mitral valve regurgitation. No evidence of mitral stenosis.   7. The aortic valve was not well visualized. Aortic valve regurgitation is not visualized. No aortic stenosis is present.   8. Aortic dilatation noted. There is mild dilatation of the  aortic root, measuring 40 mm.   9. The inferior vena cava is dilated in size with >50% respiratory variability, suggesting right atrial pressure of 8 mmHg.  Comparison(s): No prior Echocardiogram.   Patient Profile     89 y.o. 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 Admitted with SBO, leukemoid reaction (felt due to filgrastim), also found to have cirrhosis on imaging and chronic subactive T12 fx. Cardiology following for AF RVR. Due to hypotension was given IV amio on 06/02/24 but developed widening of his QRS complexes concerning for torsades so this was discontinued and switched to digoxin .   Assessment & Plan    1. SBO, leukemoid reaction - s/p noninvasive measures thus far, tolerating clear liquid diet, transitioning to soft - per primary teams    2. Permanent atrial fibrillation, possible torsades, NSVT - with RVR this admission likely driven by physiologic stressors - had hypotension with IV diltiazem , switched to IV amiodarone  but had intermittent widening of QRS complex with  torsades appearance (visualized 8/22 AM at [facility]) -> subsequently switched to IV digoxin  and IV metoprolol  which he is tolerating well - per d/w IM, OK to transition to oral form today - will stop IV digoxin /metoprolol  this AM, and move to oral digoxin  0.125mg  daily plus Lopressor  50mg  BID (may need more). Was on diltiazem  at home but favor use of BB given LV dysfunction. May be able to eventually stop digoxin  long term, but has been useful here in setting of episodically low BP - on IV heparin  per pharmacy for anticoagulation - do not see prior question of DOAC has been broached, historically on warfarin, consider transitioning at discharge (both appear equal price, $47/mo) - lytes at goal - keep K 4.0 or greater, Mg 2.0 or greater - baseline TSH in AM   3. Acute on chronic HFrEF - LVEF slightly decreased in context of RVR, from baseline - given lack of anginal symptoms, metastatic CA, advanced age, anticipate symptom based approach - appears euvolemic, poor candidate for invasive interventions-> hopeful EF will improve as his rates remain controlled - probably reasonable to see how he does with transition to oral rate controlling meds before adding additional GDMT   4. [name] s/p CABG 2005 - no recent angina reported - resume lovastatin /ezetimibe  when OK with primary team - not on ASA given concomitant anticoagulation - trops neg  5. Mild dilation of aortic root, mild MR by echo - no intervention needed, follow clinically   Remainder per primary  For questions or updates, please contact [facility] Please consult [email or web address] for contact info under Cardiology/STEMI.  Signed, Raphael LOISE Bring, PA-C 06/09/2024, 8:31 AM    I have personally seen and examined the patient.  My HPI, Exam, and assessment and plan are below, independent of the NPP above.  Patient notes CP, SOB or palpitations.  BP has improved.  Diet has improved  Exam notable for  Gen: no distress, elderly male   Neck:  No JVD Ears:  Dempsey Sign Cardiac: No Rubs or Gallops, systolic murmur IRIR tachycardia,  radial pulses intact Respiratory: Clear to auscultation bilaterally,  effort,   respiratory rate GI: Soft, nontender, non-distended  MS: No  edema;  moves all extremities Integument: Skin feels warm Neuro:  At time of evaluation, alert and oriented to person/place/time/situation  Psych: Normal affect, patient feels ok  Tele: IRIR tachycardia AF with rates 117 but was 80-100 resting  In assessment and plan:  AF RVR- ectopy has improved with no NSVT; rates are trending up but BP has improved as well; would not re-challenge with amiodarone ; today he and his daughter are amenable to DOAC transition (able to take PO); we are transitioning to PO metoprolol  and dig; long term goal will be succiante monotherapy [name]- no sx he is s/p CABG no medication changes Acute on chronic HF- euvolemic; tolerating PO; planned for Po transition of his IV dig and metoprolol ; given his cancer hx and age I do not plant to start aggressive GDMT  with electrolyte improvement NSVT burden has decreased  Stanly Leavens, MD FASE [facility] Cardiologist [facility]  [address] [place], [address] [place], [place] [address] [phone]  10:43 AM

## 2024-06-09 NOTE — Progress Notes (Signed)
 PROGRESS NOTE        PATIENT DETAILS Name: [name] Age: 89 y.o. Sex: male Date of Birth: 12/03/1931 Admit Date: 06/01/2024 Admitting Physician Alm Schneider, MD [name], [name], MD  Brief Summary: Patient is a 89 y.o.  male with history of squamous cell carcinoma of his head/neck-on chemotherapy-who presented to [facility] ED on 8/21  with SBO-A-fib RVR.  Started on NG tube decompression in the ED-amiodarone  infusion-but reportedly had a episode of torsades while on amiodarone  drip.  Patient was transferred to [facility] for further evaluation.  Significant events: 8/21>> admit to [facility] 8/25>> transferred to [facility]. 8/27>> started having BMs.  Significant studies: 8/21>> CT abdomen/pelvis: High-grade SBO.   8/21>> echo: EF 30-35%. 8/23>> SBO protocol x-ray: SBO. 8/26>> SBO protocol x-ray: Contrast lies in the stomach/proximal small bowel.  Significant microbiology data: 8/21>> urine culture:<10,000 colonies/mL. 8/21>> blood culture: No growth  Procedures: None  Consults: [facility] Cards  Subjective: Had BMs yesterday-tolerating soft diet.  Objective: Vitals: Blood pressure 130/65, pulse 94, temperature 99.1 F (37.3 C), temperature source Oral, resp. rate (!) 22, height 5' 6 (1.676 m), weight 75.3 kg, SpO2 95%.   Exam: Gen Exam:Alert awake-not in any distress HEENT:atraumatic, normocephalic Chest: B/L clear to auscultation anteriorly CVS:S1S2 regular Abdomen:soft non tender, non distended Extremities:no edema Neurology: Non focal Skin: no rash  Pertinent Labs/Radiology:    Latest Ref Rng & Units 06/09/2024    3:02 AM 06/08/2024   12:06 AM 06/07/2024    5:19 AM  CBC  WBC 4.0 - 10.5 K/uL 13.3  17.4  24.8   Hemoglobin 13.0 - 17.0 g/dL 9.9  89.5  88.1   Hematocrit 39.0 - 52.0 % 30.1  32.1  35.3   Platelets 150 - 400 K/uL 103  121  153     Lab Results  Component Value Date   NA 140 06/09/2024   K 4.0 06/09/2024   CL 105 06/09/2024   CO2 26  06/09/2024      Assessment/Plan: SBO Has improved with conservative measures Having almost daily BMs Tolerating advancement in diet No longer with NG tube Defer further to general surgery-suspect we could start weaning off TNA. Mobilize with PT/OT.   Leukemoid reaction Secondary to recent filgrastim infusion on 8/20 Not felt to have sepsis/infection issues at this point. Leukocytosis slowly decreasing.  Persistent A-fib with RVR Rate relatively well-controlled Discussed with cardiology-they will switch digoxin /beta-blocker to oral route-now that oral intake has improved. No longer on IV heparin -has been switched to SQ Lovenox  Telemetry monitoring.  Torsades This occurred at [facility] 8/23-while on amiodarone  infusion Keep K> 4, Mg> 2.  Chronic HFmrEF with worsening EF (30-35% by echo on 8/21) Volume status reasonable  History of [name] s/p CABG 2005 No anginal symptoms.  HTN BP stable On Lopressor -see above regarding plans to switch to oral beta-blocker now that oral intake has improved.  Squamous cell carcinoma of head and neck-with metastatic disease in supraclavicular region/bilateral pulmonary nodules/axillar lymphadenopathy Follows with oncology at [facility] Last carboplatin  infusion 8/19, followed by pegfilgrastim  infusion 8/20.  History of CLL Follows with oncology Under observation.  CKD stage IIIa Close to baseline.  ?Cirrhosis Seen on CT imaging this admission and also on numerous prior CTs-this is a incidental finding Patient is not aware of any formal diagnosis in the past-no history of EtOH use. Currently this appears stable-and  further workup can be deferred to the outpatient setting.  Chronic subacute compression fracture of T12 Incidental finding on CT imaging-Per radiology-this was present in May as well.  Palliative care Frail elderly 89 year old with multiple medical problems-here with SBO-with no resolution in spite of conservative  measures-surgical options being considered-high risk for surgery given numerous medical comorbidities.  DNR in place-palliative care following and engaging with family.  Nutrition Status: Nutrition Problem: Moderate Malnutrition Etiology: chronic illness Signs/Symptoms: mild fat depletion, moderate muscle depletion, severe muscle depletion, percent weight loss Percent weight loss: 7.4 % Interventions: TPN, MVI, Education  Code status:   Code Status: Limited: Do not attempt resuscitation (DNR) -DNR-LIMITED -Do Not Intubate/DNI    DVT Prophylaxis: IV heparin    Family Communication: None at bedside.   Disposition Plan: Status is: Inpatient Remains inpatient appropriate because: Severity of illness   Planned Discharge Destination:Home health vs SNF   Diet: Diet Order             DIET SOFT Room service appropriate? Yes; Fluid consistency: Thin  Diet effective now                     Antimicrobial agents: Anti-infectives (From admission, onward)    None        MEDICATIONS: Scheduled Meds:  artificial tears  1 drop Both Eyes Q6H   Chlorhexidine  Gluconate Cloth  6 each Topical Q0600   digoxin   0.125 mg Oral Daily   enoxaparin  (LOVENOX ) injection  1 mg/kg Subcutaneous BID   heparin  lock flush  500 Units Intracatheter Q30 days   insulin  aspart  0-6 Units Subcutaneous Q4H   metoprolol  tartrate  50 mg Oral BID   pantoprazole  (PROTONIX ) IV  40 mg Intravenous Q12H   sodium chloride  flush  10-40 mL Intracatheter Q12H   thiamine  (VITAMIN B1) injection  100 mg Intravenous Q24H   tobramycin   1 drop Both Eyes Q6H   Continuous Infusions:  TPN ADULT (ION) 75 mL/hr at 06/08/24 1808   TPN ADULT (ION)     PRN Meds:.fentaNYL  (SUBLIMAZE ) injection, heparin  lock flush **AND** heparin  lock flush, phenol, prochlorperazine , sodium chloride  flush   I have personally reviewed following labs and imaging studies  LABORATORY DATA: CBC: Recent Labs  Lab 06/03/24 0500  06/04/24 0256 06/05/24 0534 06/06/24 0541 06/07/24 0519 06/08/24 0006 06/09/24 0302  WBC 58.8*   < > 25.9* 24.6* 24.8* 17.4* 13.3*  NEUTROABS 43.9*  --   --   --   --   --   --   HGB 12.1*   < > 11.8* 12.0* 11.8* 10.4* 9.9*  HCT 35.9*   < > 35.9* 36.4* 35.3* 32.1* 30.1*  MCV 105.9*   < > 103.8* 102.2* 101.7* 103.5* 103.4*  PLT 201   < > 177 181 153 121* 103*   < > = values in this interval not displayed.    Basic Metabolic Panel: Recent Labs  Lab 06/03/24 0500 06/03/24 1153 06/05/24 0027 06/06/24 0541 06/07/24 0519 06/08/24 0006 06/09/24 0302  NA 142   < > 142 143 144 144 140  K 4.1   < > 4.1 4.4 3.8 3.8 4.0  CL 107   < > 105 108 107 109 105  CO2 27   < > 27 22 25 27 26   GLUCOSE 104*   < > 109* 77 141* 136* 136*  BUN 28*   < > 36* 34* 34* 27* 23  CREATININE 0.87   < > 0.90 1.05 0.97 0.82  0.79  CALCIUM  8.4*   < > 8.8* 8.8* 8.7* 8.4* 8.1*  MG 2.1   < > 2.1 1.9 2.3 1.9 2.0  PHOS 2.8  --   --  3.7 3.1 2.8 3.6  3.6   < > = values in this interval not displayed.    GFR: Estimated Creatinine Clearance: 56.5 mL/min (by C-G formula based on SCr of 0.79 mg/dL).  Liver Function Tests: Recent Labs  Lab 06/07/24 0519 06/08/24 0006 06/09/24 0302  AST 42* 44*  --   ALT 31 46*  --   ALKPHOS 138* 119  --   BILITOT 1.0 0.6  --   PROT 5.2* 4.8*  --   ALBUMIN 2.6* 2.3* 2.1*   No results for input(s): LIPASE, AMYLASE in the last 168 hours. No results for input(s): AMMONIA in the last 168 hours.  Coagulation Profile: Recent Labs  Lab 06/03/24 0500 06/04/24 0256 06/05/24 0027 06/06/24 0541  INR 1.7* 1.6* 1.2 1.2    Cardiac Enzymes: No results for input(s): CKTOTAL, CKMB, CKMBINDEX, TROPONINI in the last 168 hours.  BNP (last 3 results) No results for input(s): PROBNP in the last 8760 hours.  Lipid Profile: No results for input(s): CHOL, HDL, LDLCALC, TRIG, CHOLHDL, LDLDIRECT in the last 72 hours.  Thyroid  Function Tests: No results  for input(s): TSH, T4TOTAL, FREET4, T3FREE, THYROIDAB in the last 72 hours.  Anemia Panel: No results for input(s): VITAMINB12, FOLATE, FERRITIN, TIBC, IRON, RETICCTPCT in the last 72 hours.  Urine analysis:    Component Value Date/Time   COLORURINE YELLOW 06/01/2024 1750   APPEARANCEUR CLEAR 06/01/2024 1750   APPEARANCEUR Clear 05/22/2022 0941   LABSPEC >1.046 (H) 06/01/2024 1750   PHURINE 6.0 06/01/2024 1750   GLUCOSEU NEGATIVE 06/01/2024 1750   HGBUR SMALL (A) 06/01/2024 1750   BILIRUBINUR NEGATIVE 06/01/2024 1750   BILIRUBINUR Negative 05/22/2022 0941   KETONESUR NEGATIVE 06/01/2024 1750   PROTEINUR 30 (A) 06/01/2024 1750   UROBILINOGEN negative 02/14/2015 1247   UROBILINOGEN 0.2 01/21/2015 0850   NITRITE NEGATIVE 06/01/2024 1750   LEUKOCYTESUR NEGATIVE 06/01/2024 1750    Sepsis Labs: Lactic Acid, Venous    Component Value Date/Time   LATICACIDVEN 1.5 06/02/2024 0252    MICROBIOLOGY: Recent Results (from the past 240 hours)  Urine Culture (for pregnant, neutropenic or urologic patients or patients with an indwelling urinary catheter)     Status: Abnormal   Collection Time: 06/01/24  5:50 PM   Specimen: Urine, Clean Catch  Result Value Ref Range Status   Specimen Description   Final    URINE, CLEAN CATCH Performed at [facility], [address] [place], [place] [address]    Special Requests   Final    Immunocompromised Performed at [facility], [address] [place], [place] [address]    Culture (A)  Final    <10,000 COLONIES/mL INSIGNIFICANT GROWTH Performed at [facility], [address] [place], [place] [address]    Report Status 06/02/2024 FINAL  Final  Culture, blood (Routine X 2) w Reflex to ID Panel     Status: None   Collection Time: 06/01/24  6:14 PM   Specimen: BLOOD  Result Value Ref Range Status   Specimen Description BLOOD RIGHT ANTECUBITAL  Final   Special Requests   Final    BOTTLES DRAWN AEROBIC ONLY Blood  Culture adequate volume   Culture   Final    NO GROWTH 6 DAYS Performed at [facility], [address], [place], [place] [address]  Report Status 06/07/2024 FINAL  Final  Culture, blood (Routine X 2) w Reflex to ID Panel     Status: None   Collection Time: 06/01/24  6:14 PM   Specimen: BLOOD  Result Value Ref Range Status   Specimen Description BLOOD RIGHT ANTECUBITAL  Final   Special Requests   Final    BOTTLES DRAWN AEROBIC AND ANAEROBIC Blood Culture results may not be optimal due to an inadequate volume of blood received in culture bottles   Culture   Final    NO GROWTH 6 DAYS Performed at [facility], [address] [place], [place] [address]    Report Status 06/07/2024 FINAL  Final  MRSA Next Gen by PCR, Nasal     Status: None   Collection Time: 06/01/24  6:38 PM   Specimen: Nasal Mucosa; Nasal Swab  Result Value Ref Range Status   MRSA by PCR Next Gen NOT DETECTED NOT DETECTED Final    Comment: (NOTE) The GeneXpert MRSA Assay (FDA approved for NASAL specimens only), is one component of a comprehensive MRSA colonization surveillance program. It is not intended to diagnose MRSA infection nor to guide or monitor treatment for MRSA infections. Test performance is not FDA approved in patients less than 80 years old. Performed at [facility], [address] [place], [place] [address]     RADIOLOGY STUDIES/RESULTS: DG Abd Portable 1V Result Date: 06/08/2024 CLINICAL DATA:  [id number] SBO (small bowel obstruction) (HCC) [id number] EXAM: PORTABLE ABDOMEN - 1 VIEW COMPARISON:  06/07/2024. FINDINGS: There is continued forward propagation of positive contrast with near complete clearing of contrast in the small bowel loops. There are a few mildly dilated small bowel loops overall appears less distended than the prior radiograph. Findings favor improving partial small bowel obstruction. No evidence of pneumoperitoneum, within the limitations of a supine film. No acute osseous abnormalities.  The soft tissues are within normal limits. Surgical changes, devices, tubes and lines: Bilateral hip arthroplasty noted. IMPRESSION: *Findings favoring improving partial small bowel obstruction. Electronically Signed   By: Ree Molt M.D.   On: 06/08/2024 10:38   DG Chest Port 1 View Result Date: 06/07/2024 CLINICAL DATA:  Status post PICC line placement. EXAM: PORTABLE CHEST 1 VIEW COMPARISON:  06/01/2024 FINDINGS: Right arm PICC line. PICC line tip is near the superior cavoatrial junction. Left subclavian Port-A-Cath with the tip in the upper SVC. Nasogastric tube extends into the stomach. Subtle patchy densities at the left lung base could represent chronic changes or mild atelectasis. Heart size is stable with post CABG changes. IMPRESSION: 1. Right arm PICC line tip is near the superior cavoatrial junction. 2. No acute chest findings. Electronically Signed   By: Juliene Balder M.D.   On: 06/07/2024 13:23     LOS: 8 days   Donalda Applebaum, MD  [facility]    To contact the attending provider between 7A-7P or the covering provider during after hours 7P-7A, please log into the web site [email or web address] and access using universal [place] password for that web site. If you do not have the password, please call the hospital operator.  06/09/2024, 9:57 AM

## 2024-06-09 NOTE — Progress Notes (Signed)
 Progress Note     Subjective: Patient tolerating soft diet. Feels that he had 40% of breakfast (bacon, eggs). He denies abdominal pain, nausea, vomiting. Reports 3 bowel movements overnight. No BM this morning yet. Having flatulence.   ROS  All negative with the exception of above.  Objective: Vital signs in last 24 hours: Temp:  [98 F (36.7 C)-99.1 F (37.3 C)] 99.1 F (37.3 C) (08/29 0753) Pulse Rate:  [75-97] 94 (08/29 0753) Resp:  [16-25] 22 (08/29 0753) BP: (111-150)/(61-97) 130/65 (08/29 0753) SpO2:  [95 %-96 %] 95 % (08/29 0753) Weight:  [75.3 kg] 75.3 kg (08/29 0433) Last BM Date : 06/09/24  Intake/Output from previous day: 08/28 0701 - 08/29 0700 In: 715.2 [I.V.:715.2] Out: 1300 [Urine:1300] Intake/Output this shift: No intake/output data recorded.  PE: General: Pleasant male who is sitting in chair at bedside. Heart: HR 94 Lungs: Respiratory effort nonlabored Abd: Soft, NT, ND. No rebound tenderness or guarding.  Psych: A&Ox3 with an appropriate affect.    Lab Results:  Recent Labs    06/08/24 0006 06/09/24 0302  WBC 17.4* 13.3*  HGB 10.4* 9.9*  HCT 32.1* 30.1*  PLT 121* 103*   BMET Recent Labs    06/08/24 0006 06/09/24 0302  NA 144 140  K 3.8 4.0  CL 109 105  CO2 27 26  GLUCOSE 136* 136*  BUN 27* 23  CREATININE 0.82 0.79  CALCIUM  8.4* 8.1*   PT/INR No results for input(s): LABPROT, INR in the last 72 hours. CMP     Component Value Date/Time   NA 140 06/09/2024 0302   NA 142 05/22/2022 0955   K 4.0 06/09/2024 0302   CL 105 06/09/2024 0302   CO2 26 06/09/2024 0302   GLUCOSE 136 (H) 06/09/2024 0302   BUN 23 06/09/2024 0302   BUN 13 05/22/2022 0955   CREATININE 0.79 06/09/2024 0302   CREATININE 1.10 05/08/2013 0938   CALCIUM  8.1 (L) 06/09/2024 0302   PROT 4.8 (L) 06/08/2024 0006   PROT 7.0 05/22/2022 0955   ALBUMIN 2.1 (L) 06/09/2024 0302   ALBUMIN 4.2 05/22/2022 0955   AST 44 (H) 06/08/2024 0006   ALT 46 (H) 06/08/2024  0006   ALKPHOS 119 06/08/2024 0006   BILITOT 0.6 06/08/2024 0006   BILITOT 0.6 05/22/2022 0955   GFRNONAA >60 06/09/2024 0302   GFRNONAA 64 05/08/2013 0938   GFRAA 69 11/27/2020 0852   GFRAA 74 05/08/2013 0938   Lipase     Component Value Date/Time   LIPASE 78 02/23/2020 0933       Studies/Results: DG Abd Portable 1V Result Date: 06/08/2024 CLINICAL DATA:  [id number] SBO (small bowel obstruction) (HCC) [id number] EXAM: PORTABLE ABDOMEN - 1 VIEW COMPARISON:  06/07/2024. FINDINGS: There is continued forward propagation of positive contrast with near complete clearing of contrast in the small bowel loops. There are a few mildly dilated small bowel loops overall appears less distended than the prior radiograph. Findings favor improving partial small bowel obstruction. No evidence of pneumoperitoneum, within the limitations of a supine film. No acute osseous abnormalities. The soft tissues are within normal limits. Surgical changes, devices, tubes and lines: Bilateral hip arthroplasty noted. IMPRESSION: *Findings favoring improving partial small bowel obstruction. Electronically Signed   By: Ree Molt M.D.   On: 06/08/2024 10:38   DG Chest Port 1 View Result Date: 06/07/2024 CLINICAL DATA:  Status post PICC line placement. EXAM: PORTABLE CHEST 1 VIEW COMPARISON:  06/01/2024 FINDINGS: Right arm PICC line. PICC line tip  is near the superior cavoatrial junction. Left subclavian Port-A-Cath with the tip in the upper SVC. Nasogastric tube extends into the stomach. Subtle patchy densities at the left lung base could represent chronic changes or mild atelectasis. Heart size is stable with post CABG changes. IMPRESSION: 1. Right arm PICC line tip is near the superior cavoatrial junction. 2. No acute chest findings. Electronically Signed   By: Juliene Balder M.D.   On: 06/07/2024 13:23    Anti-infectives: Anti-infectives (From admission, onward)    None        Assessment/Plan SBO  - CT 8/21 with  high-grade SBO with fluid distention of the stomach and small intestine to midportion with relative collapse distal to that, cirrhosis of the liver, cholelithiasis without signs of cholecystitis, aortic atherosclerosis, subacute T12 compression fracture, known right hilar adenopathy - Prior abdominal surgery includes open appendectomy  - SBO protocol repeated and now with +BM and contrast in colon on 24h delay. DG Abd Portable 1 V on 8/28 showed improving partial SBO. - Afebrile. - WBC 13.3 from 17.4 - Hgb 9.9 from 10.4 - Tolerating soft diet without abdominal pain, nausea, vomiting, increased distention. Having bowel function and passing flatus. Can wean TNA. - General surgery will sign off, but will be available for any questions or concerns as needed.     FEN - TPN, Soft, IVF per TRH VTE - SCDs, Eliquis  , heparin  gtt, coumadin  held  ID - No current abx Foley - not present, spont voids    LOS: 8 days   I reviewed specialist notes, hospitalist notes, last 24 h vitals and pain scores, last 48 h intake and output, last 24 h labs and trends, and last 24 h imaging results.  This care required moderate level of medical decision making.    Marjorie Carlyon Favre, [facility] Surgery 06/09/2024, 10:55 AM Please see Amion for pager number during day hours 7:00am-4:30pm

## 2024-06-10 ENCOUNTER — Other Ambulatory Visit (HOSPITAL_COMMUNITY): Payer: Self-pay

## 2024-06-10 ENCOUNTER — Encounter: Payer: Self-pay | Admitting: Oncology

## 2024-06-10 DIAGNOSIS — I4891 Unspecified atrial fibrillation: Secondary | ICD-10-CM | POA: Diagnosis not present

## 2024-06-10 DIAGNOSIS — I4821 Permanent atrial fibrillation: Secondary | ICD-10-CM | POA: Diagnosis not present

## 2024-06-10 DIAGNOSIS — I5022 Chronic systolic (congestive) heart failure: Secondary | ICD-10-CM | POA: Diagnosis not present

## 2024-06-10 DIAGNOSIS — K56609 Unspecified intestinal obstruction, unspecified as to partial versus complete obstruction: Secondary | ICD-10-CM | POA: Diagnosis not present

## 2024-06-10 DIAGNOSIS — C9110 Chronic lymphocytic leukemia of B-cell type not having achieved remission: Secondary | ICD-10-CM | POA: Diagnosis not present

## 2024-06-10 LAB — GLUCOSE, CAPILLARY
Glucose-Capillary: 111 mg/dL — ABNORMAL HIGH (ref 70–99)
Glucose-Capillary: 116 mg/dL — ABNORMAL HIGH (ref 70–99)
Glucose-Capillary: 89 mg/dL (ref 70–99)

## 2024-06-10 LAB — CBC
HCT: 30.3 % — ABNORMAL LOW (ref 39.0–52.0)
Hemoglobin: 9.9 g/dL — ABNORMAL LOW (ref 13.0–17.0)
MCH: 34.3 pg — ABNORMAL HIGH (ref 26.0–34.0)
MCHC: 32.7 g/dL (ref 30.0–36.0)
MCV: 104.8 fL — ABNORMAL HIGH (ref 80.0–100.0)
Platelets: 91 K/uL — ABNORMAL LOW (ref 150–400)
RBC: 2.89 MIL/uL — ABNORMAL LOW (ref 4.22–5.81)
RDW: 13.7 % (ref 11.5–15.5)
WBC: 13.1 K/uL — ABNORMAL HIGH (ref 4.0–10.5)
nRBC: 0 % (ref 0.0–0.2)

## 2024-06-10 LAB — TSH: TSH: 1.608 u[IU]/mL (ref 0.350–4.500)

## 2024-06-10 MED ORDER — APIXABAN 5 MG PO TABS
5.0000 mg | ORAL_TABLET | Freq: Two times a day (BID) | ORAL | 2 refills | Status: DC
  Filled 2024-06-10: qty 60, 30d supply, fill #0

## 2024-06-10 MED ORDER — METOPROLOL SUCCINATE ER 100 MG PO TB24
100.0000 mg | ORAL_TABLET | Freq: Every day | ORAL | Status: DC
  Administered 2024-06-10: 100 mg via ORAL
  Filled 2024-06-10: qty 1

## 2024-06-10 MED ORDER — METOPROLOL SUCCINATE ER 100 MG PO TB24
100.0000 mg | ORAL_TABLET | Freq: Every day | ORAL | 2 refills | Status: DC
Start: 2024-06-11 — End: 2024-07-07
  Filled 2024-06-10: qty 30, 30d supply, fill #0

## 2024-06-10 MED ORDER — DIGOXIN 125 MCG PO TABS
0.1250 mg | ORAL_TABLET | Freq: Every day | ORAL | 1 refills | Status: DC
  Filled 2024-06-10: qty 30, 30d supply, fill #0

## 2024-06-10 NOTE — TOC Transition Note (Signed)
 Transition of Care [facility]) - Discharge Note   Patient Details  Name: [name] MRN: [id number] Date of Birth: 1931/10/23  Transition of Care [facility] CM/SW Contact:  Marval Gell, RN Phone Number: 06/10/2024, 10:24 AM   Clinical Narrative:     [place] w patient, agreeable to [facility] services, [place] able to provide, patient had no preference of provider.     Barriers to Discharge: Continued Medical Work up   Patient Goals and CMS Choice Patient states their goals for this hospitalization and ongoing recovery are:: from home CMS Medicare.gov Compare Post Acute Care list provided to:: Patient Choice offered to / list presented to : Patient      Discharge Placement                       Discharge Plan and Services Additional resources added to the After Visit Summary for   In-house Referral: Clinical Social Work Discharge Planning Services: CM Consult                                 Social Drivers of Health (SDOH) Interventions SDOH Screenings   Food Insecurity: No Food Insecurity (06/01/2024)  Housing: Low Risk  (06/01/2024)  Transportation Needs: No Transportation Needs (06/01/2024)  Utilities: Not At Risk (06/01/2024)  Alcohol  Screen: Low Risk  (01/06/2023)  Depression (PHQ2-9): Low Risk  (05/30/2024)  Recent Concern: Depression (PHQ2-9) - High Risk (04/13/2024)  Financial Resource Strain: Low Risk  (04/09/2024)  Physical Activity: Inactive (04/09/2024)  Social Connections: Moderately Integrated (06/01/2024)  Recent Concern: Social Connections - Moderately Isolated (04/09/2024)  Stress: No Stress Concern Present (04/09/2024)  Tobacco Use: Medium Risk (06/01/2024)     Readmission Risk Interventions    06/02/2024   10:32 AM  Readmission Risk Prevention Plan  Transportation Screening Complete  HRI or Home Care Consult Complete  Social Work Consult for Recovery Care Planning/Counseling Complete  Palliative Care Screening Not Applicable  Medication Review Furniture conservator/restorer) Complete

## 2024-06-10 NOTE — Plan of Care (Signed)
  Problem: Health Behavior/Discharge Planning: Goal: Ability to manage health-related needs will improve Outcome: Progressing   Problem: Clinical Measurements: Goal: Will remain free from infection Outcome: Progressing   Problem: Activity: Goal: Risk for activity intolerance will decrease Outcome: Progressing   Problem: Coping: Goal: Level of anxiety will decrease Outcome: Progressing   Problem: Elimination: Goal: Will not experience complications related to bowel motility Outcome: Progressing   Problem: Safety: Goal: Ability to remain free from injury will improve Outcome: Progressing

## 2024-06-10 NOTE — Progress Notes (Signed)
 Discharged instructions discussed with patient and daughter Avelina. Written instructions given. Daughter to provide transport home

## 2024-06-10 NOTE — Progress Notes (Signed)
 PICC line was removed per order with no complications.  Pt horizontal and supine and suspended inspiration during removal with instructions to remain horizontal for 30 minutes after removal.  Pressure held to achieve hemostasis.  Vaseline/gauze/tegaderm applied.  Patient education provided regarding lifting restrictions, site care, and signs of infection.  Pt verbalized understanding and had no questions.

## 2024-06-10 NOTE — Progress Notes (Signed)
 Progress Note  Patient Name: [name] Date of Encounter: 06/10/2024  Primary Cardiologist: Lynwood Schilling, MD  Subjective   Asymptomatic.  Pending DC today.  Inpatient Medications    Scheduled Meds:  apixaban   5 mg Oral BID   artificial tears  1 drop Both Eyes Q6H   Chlorhexidine  Gluconate Cloth  6 each Topical Q0600   digoxin   0.125 mg Oral Daily   heparin  lock flush  500 Units Intracatheter Q30 days   insulin  aspart  0-6 Units Subcutaneous Q4H   metoprolol  tartrate  50 mg Oral BID   pantoprazole  (PROTONIX ) IV  40 mg Intravenous Q12H   sodium chloride  flush  10-40 mL Intracatheter Q12H   thiamine  (VITAMIN B1) injection  100 mg Intravenous Q24H   tobramycin   1 drop Both Eyes Q6H   Continuous Infusions:  TPN ADULT (ION) 40 mL/hr at 06/09/24 1735   PRN Meds: fentaNYL  (SUBLIMAZE ) injection, heparin  lock flush **AND** heparin  lock flush, phenol, prochlorperazine , sodium chloride  flush   Vital Signs    Vitals:   06/09/24 2109 06/10/24 0014 06/10/24 0410 06/10/24 0500  BP: 125/62 128/82 130/66   Pulse: 98  87   Resp:   (!) 22   Temp:  98.4 F (36.9 C) 98.6 F (37 C)   TempSrc:  Oral Oral   SpO2:   94%   Weight:    77.3 kg  Height:        Intake/Output Summary (Last 24 hours) at 06/10/2024 0746 Last data filed at 06/10/2024 0414 Gross per 24 hour  Intake --  Output 1500 ml  Net -1500 ml      06/10/2024    5:00 AM 06/09/2024    4:33 AM 06/06/2024    2:56 PM  Last 3 Weights  Weight (lbs) 170 lb 6.7 oz 166 lb 0.1 oz 158 lb 4.6 oz  Weight (kg) 77.3 kg 75.3 kg 71.8 kg     Telemetry    Atrial fib, rates 87 on average - Personally Reviewed  Physical Exam   GEN: No acute distress.  Neck: No JVD  Cardiac: Irregularly irregular, rate ~80s, SEM, no rubs or gallops.  Respiratory: Clear to auscultation bilaterally. Breathing is unlabored. GI: Soft, nontender, non-distended, BS + Extremities: No clubbing or cyanosis. No edema. +2 pulses Neuro:  AAOx4.  Psych:   Responds to questions appropriately with a normal affect.  Labs    High Sensitivity Troponin:   Recent Labs  Lab 06/01/24 1000 06/01/24 1206  TROPONINIHS 9 10       Chemistry Recent Labs  Lab 06/07/24 0519 06/08/24 0006 06/09/24 0302  NA 144 144 140  K 3.8 3.8 4.0  CL 107 109 105  CO2 25 27 26   GLUCOSE 141* 136* 136*  BUN 34* 27* 23  CREATININE 0.97 0.82 0.79  CALCIUM  8.7* 8.4* 8.1*  PROT 5.2* 4.8*  --   ALBUMIN 2.6* 2.3* 2.1*  AST 42* 44*  --   ALT 31 46*  --   ALKPHOS 138* 119  --   BILITOT 1.0 0.6  --   GFRNONAA >60 >60 >60  ANIONGAP 12 8 9      Hematology Recent Labs  Lab 06/08/24 0006 06/09/24 0302 06/10/24 0253  WBC 17.4* 13.3* 13.1*  RBC 3.10* 2.91* 2.89*  HGB 10.4* 9.9* 9.9*  HCT 32.1* 30.1* 30.3*  MCV 103.5* 103.4* 104.8*  MCH 33.5 34.0 34.3*  MCHC 32.4 32.9 32.7  RDW 13.8 13.9 13.7  PLT 121* 103* 91*  BNPNo results for input(s): BNP, PROBNP in the last 168 hours.   DDimer No results for input(s): DDIMER in the last 168 hours.   Radiology    DG Abd Portable 1V Result Date: 06/08/2024 CLINICAL DATA:  881154 SBO (small bowel obstruction) (HCC) [id number] EXAM: PORTABLE ABDOMEN - 1 VIEW COMPARISON:  06/07/2024. FINDINGS: There is continued forward propagation of positive contrast with near complete clearing of contrast in the small bowel loops. There are a few mildly dilated small bowel loops overall appears less distended than the prior radiograph. Findings favor improving partial small bowel obstruction. No evidence of pneumoperitoneum, within the limitations of a supine film. No acute osseous abnormalities. The soft tissues are within normal limits. Surgical changes, devices, tubes and lines: Bilateral hip arthroplasty noted. IMPRESSION: *Findings favoring improving partial small bowel obstruction. Electronically Signed   By: Ree Molt M.D.   On: 06/08/2024 10:38    Cardiac Studies   2D echo 06/01/24  1. Challenging study in the  setting of tachycardia, HR 120-130s.   2. Left ventricular ejection fraction, by estimation, is 30 to 35%. The left ventricle has moderately decreased function. Left ventricular  endocardial border not optimally defined to evaluate regional wall motion. Left ventricular diastolic function could not be evaluated.   3. Right ventricular systolic function was not well visualized. The right ventricular size is moderately enlarged. There is normal pulmonary artery  systolic pressure.   4. Left atrial size was severely dilated.   5. Right atrial size was severely dilated.   6. The mitral valve is grossly normal. Mild mitral valve regurgitation. No evidence of mitral stenosis.   7. The aortic valve was not well visualized. Aortic valve regurgitation is not visualized. No aortic stenosis is present.   8. Aortic dilatation noted. There is mild dilatation of the aortic root, measuring 40 mm.   9. The inferior vena cava is dilated in size with >50% respiratory variability, suggesting right atrial pressure of 8 mmHg.  Comparison(s): No prior Echocardiogram.   Patient Profile     89 y.o. 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 Admitted with SBO, leukemoid reaction (felt due to filgrastim), also found to have cirrhosis on imaging and chronic subactive T12 fx. Cardiology following for AF RVR. Due to hypotension was given IV amio on 06/02/24 but developed widening of his QRS complexes concerning for torsades so this was discontinued and switched to digoxin .   Assessment & Plan    1. SBO, leukemoid reaction - s/p noninvasive measures thus far, tolerating clear liquid diet, transitioning to soft - per primary teams    2. Permanent atrial fibrillation, possible torsades, NSVT - with RVR this admission likely driven by physiologic  stressors - had hypotension with IV diltiazem , switched to IV amiodarone  but had intermittent widening of QRS complex with torsades appearance (visualized 8/22 AM at [facility]) -> subsequently switched to IV digoxin  and IV metoprolol  which he is tolerating well - consolidation to digoxin  0.125 mg day and 100 mg metoprolol  succinate done today - if rates are well controlled on this may trial removal of digoxin  - on DOAC  - lytes at goal - keep K 4.0 or greater, Mg 2.0 or greater; TSH WNL   3. Acute on chronic HFrEF - LVEF slightly decreased in context of RVR, from baseline - given lack of anginal symptoms,  metastatic CA, advanced age, anticipate symptom based approach - appears euvolemic, poor candidate for invasive interventions-> hopeful EF will improve as his rates remain controlled    4. [name] s/p CABG 2005 - no recent angina reported - resume lovastatin /ezetimibe  when OK with primary team - not on ASA given concomitant anticoagulation - trops neg  5. Mild dilation of aortic root, mild MR by echo - no intervention needed, follow clinically; may not need serial f/u screening due to comorbidity  6. Metastatic cancer (squamous cell with head and neck primary) - Carboplatin  and paclitaxel  can cause HFrEF and cardiac dysfunction however given his imaging done in RVR I suspect this was tachy-mediated and would argue that this should not keep him from chemotherapy- - diltiazem  DC as DC in favor of metoprolol  succinate - as outpatient evaluate volume status (presently hypovolemic and not needing his home lasix  20 mg; tartrate has been changed to succinate - evaluate for ARB therapy and increase BB as tolerated; consider at 06/23/24 visit DC of digoxin  given age and comorbidity (as he recovers) - DOAC is new this admission after price check (warfarin DC daughter is ok with cost)-   For questions or updates, please contact [facility] Please consult [email or web address] for contact info under  Cardiology/STEMI.  Stanly Leavens, MD FASE [facility] Cardiologist [facility]  [address] [place], [address] [place], [place] [address] [phone]  7:46 AM

## 2024-06-10 NOTE — Progress Notes (Signed)
 Palliative Medicine Progress Note   Patient Name: [name]       Date: 06/10/2024 DOB: 07/13/1932  Age: 89 y.o. MRN#: [id number] Attending Physician: Raenelle Donalda HERO, MD Primary Care Physician: [name], [name], MD Admit Date: 06/01/2024  HPI/Patient Profile: [name] is a 89 y.o. male with a PMH significant for HTN, HLD, Chronic HFrEF with EF 30-35% on 06/01/24, A. 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 Patient without improvement over the past five days despite SBO protocol. He is a high surgical risk for surgical intervention which is being considered therefore the PMT has been asled to assist in GOC conversation(s).   Subjective: Chart reviewed. Updates received. Patient assessed.   Patient is tolerating heart-healthy diet, with no nausea/vomiting. He reports having BM's today. He expresses relief at being able to avoid surgery, and is looking forward to discharging home (hopefully tomorrow).    Objective:  Physical Exam Vitals reviewed.  Constitutional:      Comments: Chronically ill-apperaing  Pulmonary:     Effort: Pulmonary effort is normal.  Neurological:     Mental Status: He is alert and oriented to person, place, and time.             Palliative Medicine Assessment & Plan   Assessment: Principal Problem:   SBO (small bowel obstruction) (HCC) Active Problems:   Persistent atrial fibrillation (HCC)   Coronary artery disease involving native coronary artery of native heart without angina pectoris   CLL (chronic  lymphocytic leukemia) (HCC)   Chronic HFrEF (heart failure with reduced ejection fraction) (HCC)   Permanent atrial fibrillation (HCC)   Torsades de pointes (HCC)   Atrial fibrillation with rapid ventricular response (HCC)   Malnutrition of moderate degree    Recommendations/Plan: Continue current supportive care Goal is medical stabilization PMT will continue to follow  Code Status: DNR/DNI   Prognosis:  Unable to determine  Discharge Planning: Home with Home Health   Thank you for allowing the Palliative Medicine Team to assist in the care of this patient.   MDM - low   Recardo NOVAK  Sharlet Notaro, NP   Please contact Palliative Medicine Team phone at [phone] for questions and concerns.  For individual providers, please see AMION.

## 2024-06-10 NOTE — Discharge Summary (Signed)
 PATIENT DETAILS Name: [name] Age: 89 y.o. Sex: male Date of Birth: 14-Oct-1931 MRN: [id number]. Admitting Physician: Greg Schneider, MD ERE:Greg, [name], MD  Admit Date: 06/01/2024 Discharge date: 06/10/2024  Recommendations for Outpatient Follow-up:  Follow up with PCP in 1-2 weeks Please obtain CMP/CBC in one week Has radiographic evidence of liver cirrhosis-this is not a new finding-this goes back to several prior CT scans, he is currently asymptomatic-this is stable to be worked up in the outpatient setting.  Admitted From:  Home  Disposition: Home with home health   Discharge Condition: good  CODE STATUS:   Code Status: Limited: Do not attempt resuscitation (DNR) -DNR-LIMITED -Do Not Intubate/DNI    Diet recommendation:  Diet Order             Diet - low sodium heart healthy           DIET SOFT Room service appropriate? Yes with Assist; Fluid consistency: Thin  Diet effective now                    Brief Summary: Patient is a 89 y.o.  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  Started on NG tube decompression in the ED-amiodarone  infusion-but reportedly had a episode of torsades while on amiodarone  drip.  Patient was transferred to [facility] for further evaluation.   Significant events: 8/21>> admit to [facility] 8/25>> transferred to [facility]. 8/27>> started having BMs.   Significant studies: 8/21>> CT abdomen/pelvis: High-grade SBO.   8/21>> echo: EF 30-35%. 8/23>> SBO protocol x-ray: SBO. 8/26>> SBO protocol x-ray: Contrast lies in the stomach/proximal small bowel.   Significant microbiology data: 8/21>> urine culture:<10,000 colonies/mL. 8/21>> blood culture: No growth   Procedures: None   Consults: [facility] Cards  Brief Hospital Course: SBO Thankfully has improved with conservative measures Having almost daily BMs Tolerating advancement in diet No longer with  NG tube TNA weaned off Per general surgery-okay to discharge.   Leukemoid reaction Secondary to recent filgrastim infusion on 8/20 Not felt to have sepsis/infection issues at this point. Leukocytosis slowly decreasing. Repeat CBC in 1 week.   Persistent A-fib with RVR Rate controlled-on IV beta-blocker/digoxin -which has been now switched to oral route due to improvement in SBO/diet. Cardiology discussed with patient/family-has been switched to DOAC.  Please ensure outpatient follow-up with cardiology.   Torsades This occurred at [facility] 8/23-while on amiodarone  infusion No further episodes.  Electrolytes were monitored closely.   Chronic HFmrEF with worsening EF (30-35% by echo on 8/21) Volume status reasonable   History of [name] s/p CABG 2005 No anginal symptoms.   HTN BP stable Now on oral beta-blocker.   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   History of CLL Follows with oncology Under observation.   CKD stage IIIa Close to baseline.   ?Cirrhosis Seen on CT imaging this admission and also on numerous prior CTs-this is a incidental finding Patient is not aware of any formal diagnosis in the past-no history of EtOH use. Currently this appears stable-and further workup can be deferred to the outpatient setting.   Chronic subacute compression fracture of T12 Incidental finding on CT imaging-Per radiology-this was present in May as well.   Palliative care Frail elderly 89 year old with multiple medical problems-here with SBO-with no  resolution in spite of conservative measures-surgical options being considered-high risk for surgery given numerous medical comorbidities.  DNR in place-palliative care following and engaging with family.   Nutrition  Status: Nutrition Problem: Moderate Malnutrition Etiology: chronic illness Signs/Symptoms: mild fat depletion, moderate muscle depletion, severe muscle depletion, percent weight loss Percent weight loss: 7.4 % Interventions: TPN, MVI, Education   Discharge Diagnoses:  Principal Problem:   SBO (small bowel obstruction) (HCC) Active Problems:   Persistent atrial fibrillation (HCC)   Coronary artery disease involving native coronary artery of native heart without angina pectoris   CLL (chronic lymphocytic leukemia) (HCC)   Chronic HFrEF (heart failure with reduced ejection fraction) (HCC)   Permanent atrial fibrillation (HCC)   Torsades de pointes (HCC)   Atrial fibrillation with rapid ventricular response (HCC)   Malnutrition of moderate degree   Discharge Instructions:  Activity:  As tolerated with Full fall precautions use walker/cane & assistance as needed   Discharge Instructions     Diet - low sodium heart healthy   Complete by: As directed    Discharge instructions   Complete by: As directed    Follow with Primary MD  [name], Fonda LABOR, MD in 1-2 weeks  Follow up with cardiology, you have an appointment on 9/12-their office will call to confirm date/time  Follow-up with oncology as previously scheduled    Please get a complete blood count and chemistry panel checked by your Primary MD at your next visit, and again as instructed by your Primary MD.  Get Medicines reviewed and adjusted: Please take all your medications with you for your next visit with your Primary MD  Laboratory/radiological data: Please request your Primary MD to go over all hospital tests and procedure/radiological results at the follow up, please ask your Primary MD to get all Hospital records sent to his/her office.  In some cases, they will be blood work, cultures and biopsy results pending at the time of your discharge. Please request that your primary care M.D. follows up on these  results.  Also Note the following: If you experience worsening of your admission symptoms, develop shortness of breath, life threatening emergency, suicidal or homicidal thoughts you must seek medical attention immediately by calling 911 or calling your MD immediately  if symptoms less severe.  You must read complete instructions/literature along with all the possible adverse reactions/side effects for all the Medicines you take and that have been prescribed to you. Take any new Medicines after you have completely understood and accpet all the possible adverse reactions/side effects.   Do not drive when taking Pain medications or sleeping medications (Benzodaizepines)  Do not take more than prescribed Pain, Sleep and Anxiety Medications. It is not advisable to combine anxiety,sleep and pain medications without talking with your primary care practitioner  Special Instructions: If you have smoked or chewed Tobacco  in the last 2 yrs please stop smoking, stop any regular Alcohol   and or any Recreational drug use.  Wear Seat belts while driving.  Please note: You were cared for by a hospitalist during your hospital stay. Once you are discharged, your primary care physician will handle any further medical issues. Please note that NO REFILLS for any discharge medications will be authorized once you are discharged, as it is imperative that you return to your primary care physician (or establish a relationship with a primary care physician if you do not have one) for your post hospital discharge needs so that they can reassess your need for medications  and monitor your lab values.   Increase activity slowly   Complete by: As directed       Allergies as of 06/10/2024       Reactions   Penicillins Hives, Rash   Has patient had a PCN reaction causing immediate rash, facial/tongue/throat swelling, SOB or lightheadedness with hypotension: Yes Has patient had a PCN reaction causing severe rash involving  mucus membranes or skin necrosis: Yes Has patient had a PCN reaction that required hospitalization: No Has patient had a PCN reaction occurring within the last 10 years: No If all of the above answers are NO, then may proceed with Cephalosporin use.   Cetuximab  Cough   Cough and scratchy throat. Drug rechallenged and pt able to tolerated the rest of the infusion without any complications.    Hct [hydrochlorothiazide] Other (See Comments)   hyper   Statins Other (See Comments)   Myalgia, tolerates low dosages of lovastatin          Medication List     STOP taking these medications    diltiazem  120 MG 24 hr capsule Commonly known as: CARDIZEM  CD   doxycycline  100 MG tablet Commonly known as: VIBRA -TABS   furosemide  20 MG tablet Commonly known as: LASIX    metoprolol  tartrate 50 MG tablet Commonly known as: LOPRESSOR    warfarin 5 MG tablet Commonly known as: COUMADIN        TAKE these medications    acetaminophen  500 MG tablet Commonly known as: TYLENOL  Take 1,000 mg by mouth every 6 (six) hours as needed for moderate pain.   apixaban  5 MG Tabs tablet Commonly known as: ELIQUIS  Take 1 tablet (5 mg total) by mouth 2 (two) times daily.   clindamycin  1 % gel Commonly known as: CLINDAGEL Apply 1 Application topically daily at 6 (six) AM.   digoxin  0.125 MG tablet Commonly known as: LANOXIN  Take 1 tablet (0.125 mg total) by mouth daily. Start taking on: June 11, 2024   ezetimibe  10 MG tablet Commonly known as: ZETIA  TAKE ONE (1) TABLET BY MOUTH EVERY DAY   finasteride  5 MG tablet Commonly known as: PROSCAR  TAKE ONE (1) TABLET EACH DAY   fluocinonide  cream 0.05 % Commonly known as: LIDEX  Apply 1 Application topically 2 (two) times daily.   fluticasone  50 MCG/ACT nasal spray Commonly known as: FLONASE  Place 1 spray into both nostrils daily as needed for allergies or rhinitis.   lovastatin  40 MG tablet Commonly known as: MEVACOR  Take 1 tablet (40 mg  total) by mouth at bedtime.   magnesium  oxide 400 (240 Mg) MG tablet Commonly known as: MAG-OX Take 1 tablet (400 mg total) by mouth 2 (two) times daily.   methylphenidate  5 MG tablet Commonly known as: RITALIN  TAKE 1 TABLET BY MOUTH DAILY AS NEEDED   metoprolol  succinate 100 MG 24 hr tablet Commonly known as: TOPROL -XL Take 1 tablet (100 mg total) by mouth daily. Take with or immediately following a meal. Start taking on: June 11, 2024   montelukast  10 MG tablet Commonly known as: SINGULAIR  Take 1 tablet (10 mg total) by mouth at bedtime.   prochlorperazine  10 MG tablet Commonly known as: COMPAZINE  Take 1 tablet (10 mg total) by mouth every 6 (six) hours as needed for nausea or vomiting.   tobramycin  0.3 % ophthalmic solution Commonly known as: TOBREX  Place 1 drop into both eyes in the morning, at noon, in the evening, and at bedtime.   trimethoprim-polymyxin b  ophthalmic solution Commonly known as: POLYTRIM SMARTSIG:In Eye(s)  Vitamin D3 125 MCG (5000 UT) Caps Take 5,000 Units by mouth once a week.        Follow-up Information     [name], Fonda LABOR, MD. Schedule an appointment as soon as possible for a visit in 1 week(s).   Specialties: Family Medicine, Cardiology Contact information: [address] [place] [address] [phone]         Abigail Raphael SAILOR, PA-C Follow up on 06/23/2024.   Specialties: Cardiology, Radiology Why: appt at 2:30 pm Contact information: [address] [place] [address] [phone]                Allergies  Allergen Reactions   Penicillins Hives and Rash    Has patient had a PCN reaction causing immediate rash, facial/tongue/throat swelling, SOB or lightheadedness with hypotension: Yes Has patient had a PCN reaction causing severe rash involving mucus membranes or skin necrosis: Yes Has patient had a PCN reaction that required hospitalization: No Has patient had a PCN reaction occurring within the last 10 years:  No If all of the above answers are NO, then may proceed with Cephalosporin use.    Cetuximab  Cough    Cough and scratchy throat. Drug rechallenged and pt able to tolerated the rest of the infusion without any complications.    Hct [Hydrochlorothiazide] Other (See Comments)    hyper   Statins Other (See Comments)    Myalgia, tolerates low dosages of lovastatin       Other Procedures/Studies: DG Abd Portable 1V Result Date: 06/08/2024 CLINICAL DATA:  [id number] SBO (small bowel obstruction) (HCC) [id number] EXAM: PORTABLE ABDOMEN - 1 VIEW COMPARISON:  06/07/2024. FINDINGS: There is continued forward propagation of positive contrast with near complete clearing of contrast in the small bowel loops. There are a few mildly dilated small bowel loops overall appears less distended than the prior radiograph. Findings favor improving partial small bowel obstruction. No evidence of pneumoperitoneum, within the limitations of a supine film. No acute osseous abnormalities. The soft tissues are within normal limits. Surgical changes, devices, tubes and lines: Bilateral hip arthroplasty noted. IMPRESSION: *Findings favoring improving partial small bowel obstruction. Electronically Signed   By: Ree Molt M.D.   On: 06/08/2024 10:38   DG Chest Port 1 View Result Date: 06/07/2024 CLINICAL DATA:  Status post PICC line placement. EXAM: PORTABLE CHEST 1 VIEW COMPARISON:  06/01/2024 FINDINGS: Right arm PICC line. PICC line tip is near the superior cavoatrial junction. Left subclavian Port-A-Cath with the tip in the upper SVC. Nasogastric tube extends into the stomach. Subtle patchy densities at the left lung base could represent chronic changes or mild atelectasis. Heart size is stable with post CABG changes. IMPRESSION: 1. Right arm PICC line tip is near the superior cavoatrial junction. 2. No acute chest findings. Electronically Signed   By: Juliene Balder M.D.   On: 06/07/2024 13:23   DG Abd Portable 1V Result Date:  06/07/2024 CLINICAL DATA:  Small bowel obstruction. EXAM: DG ABD PORTABLE 1V COMPARISON:  06/06/2024 FINDINGS: Compared to the prior examination, contrast has moved through the small bowel and now in the colon. Persistent dilated loops of small bowel. Nasogastric tube is present. There is probably residual contrast in the gastric fundus. Limited evaluation for free air on these supine images. Bilateral hip replacements. IMPRESSION: 1. Contrast has moved through the small bowel and now in the colon. 2. Persistent dilated loops of small bowel. Findings could represent a partial small bowel obstruction. Electronically Signed   By: Juliene  Philip M.D.   On: 06/07/2024 13:20   US  EKG SITE RITE Result Date: 06/07/2024 If [facility] image not attached, placement could not be confirmed due to current cardiac rhythm.  DG Abd Portable 1V-Small Bowel Obstruction Protocol-initial, 8 hr delay Result Date: 06/07/2024 CLINICAL DATA:  8 hour small-bowel follow-through EXAM: PORTABLE ABDOMEN - 1 VIEW COMPARISON:  Film from the previous day. FINDINGS: Gastric catheter is noted within the stomach. Administered contrast lies within the stomach and proximal mildly dilated small bowel. Persistent small bowel dilatation is noted. No colonic contrast is seen. Bilateral hip replacements are noted. Degenerative change of the lumbar spine is noted. IMPRESSION: Administered contrast lies primarily within the stomach and proximal small bowel. No distal small bowel visualization is noted. 24 hour film is recommended. Electronically Signed   By: Oneil Devonshire M.D.   On: 06/07/2024 00:14   DG Abd Portable 1V Result Date: 06/05/2024 CLINICAL DATA:  Nasogastric tube placement. EXAM: PORTABLE ABDOMEN - 1 VIEW COMPARISON:  Same day at 0804 hours. FINDINGS: Nasogastric tube terminates in the stomach with the side port beyond the gastroesophageal junction. Gaseous distension of bowel in the visualized abdomen. IMPRESSION: 1. Feeding tube terminates  in the stomach with the side port well beyond the gastroesophageal junction. 2. Persistent gaseous distention of small bowel and colon. Electronically Signed   By: Newell Eke M.D.   On: 06/05/2024 12:30   DG Abd 1 View Result Date: 06/05/2024 CLINICAL DATA:  NG tube placement. EXAM: ABDOMEN - 1 VIEW COMPARISON:  Radiographs yesterday FINDINGS: Tip and side port of the enteric tube below the diaphragm in the stomach. Gaseous gastric distention persists. Prominent air-filled loops of small bowel centrally as well as air in the transverse colon, similar to yesterday's exam. Gallstones. IMPRESSION: 1. Tip and side port of the enteric tube below the diaphragm in the stomach. 2. Persistent gaseous gastric distention. 3. Prominent air-filled loops of small bowel centrally as well as air in the transverse colon, similar to yesterday's exam. Electronically Signed   By: Andrea Gasman M.D.   On: 06/05/2024 10:22   DG Abd 1 View Result Date: 06/04/2024 CLINICAL DATA:  Small bowel obstructionr. EXAM: ABDOMEN - 1 VIEW COMPARISON:  06/03/2024 FINDINGS: NG tube tip is identified in the stomach. Diffuse gaseous distention of small bowel persists with similar appearance. Gas-filled dilated small bowel in the low abdomen measures 5.2 cm today compared to 4.9 cm previously. Air in stool is seen in a nondilated transverse and left colon. Stones in the right upper quadrant compatible with gallstones as seen on CT 06/01/2024. Oral contrast material is visible in the stomach previously but is no longer present. No opacification of small bowel or colonic contents evident. Patient is status post bilateral hip replacement. IMPRESSION: Persistent diffuse gaseous distention of small bowel with similar appearance to prior. Imaging features remain compatible with small bowel obstruction. Electronically Signed   By: Camellia Candle M.D.   On: 06/04/2024 09:52   DG Abd Portable 1V-Small Bowel Obstruction Protocol-initial, 8 hr  delay Result Date: 06/03/2024 CLINICAL DATA:  SBO 8-hr delay image EXAM: PORTABLE ABDOMEN - 1 VIEW COMPARISON:  X-ray abdomen 06/03/2024 9:26 a.m. FINDINGS: Enteric tube with tip overlying the gastric lumen and side port overlying the expected region of the gastroesophageal junction. Persistent gaseous small bowel dilatation. PO contrast within the gastric lumen. Right upper quadrant cholelithiasis. Bilateral hip degenerative changes. IMPRESSION: 1. Small-bowel obstruction. 2. PO contrast within the gastric lumen. 3. Enteric tube with tip overlying  the gastric lumen and side port overlying the expected region of the gastroesophageal junction. 4. Cholelithiasis. Electronically Signed   By: Morgane  Naveau M.D.   On: 06/03/2024 21:47   DG Abd Portable 1V-Small Bowel Protocol-Position Verification Result Date: 06/03/2024 CLINICAL DATA:  NG tube placement. EXAM: PORTABLE ABDOMEN - 1 VIEW COMPARISON:  Abdominal radiograph and CT 06/01/2024 FINDINGS: Tip of the weighted enteric tube in the stomach, the side port is likely below the diaphragm but not clearly defined on the current exam. Gaseous gastric distension. Gaseous small bowel distention up to 4.2 cm. Small amount of formed stool is in the colon. Right upper quadrant gallstones. IMPRESSION: 1. Tip of the weighted enteric tube in the stomach, the side port is likely below the diaphragm but not clearly defined on the current exam. 2. Gaseous gastric and small bowel distention, suspicious for obstruction. Electronically Signed   By: Andrea Gasman M.D.   On: 06/03/2024 10:23   DG Chest 1 View Result Date: 06/01/2024 CLINICAL DATA:  NG tube placement EXAM: CHEST  1 VIEW COMPARISON:  06/01/2024, 05/02/2024 FINDINGS: Sternotomy and cardiomegaly with strandy opacities at the bases. Enteric tube tip below the diaphragm, side-port in the region of the stomach, tip is non included. Marked air distension of the stomach IMPRESSION: Enteric tube tip below the diaphragm,  side-port in the region of the stomach, tip is non included in the field of view. Marked air distension of the stomach. Electronically Signed   By: Luke Bun M.D.   On: 06/01/2024 20:16   DG Abdomen 1 View Result Date: 06/01/2024 CLINICAL DATA:  NG tube placement- had to put new one in due to getting pulled out. EXAM: ABDOMEN - 1 VIEW COMPARISON:  None Available. FINDINGS: The bowel gas pattern is non-obstructive. No evidence of pneumoperitoneum, within the limitations of a supine film. No acute osseous abnormalities. The soft tissues are within normal limits. Surgical changes, devices, tubes and lines: Interval placement of nasogastric tube with its tip overlying the fundus of the stomach region. However, side hole is just above the left hemidiaphragm in the GE junction region. Recommend further advancement by 2-3 inches to confidently put the side-hole into the stomach. IMPRESSION: *Interval placement of nasogastric tube with its tip overlying the fundus of the stomach region. However, side hole is just above the left hemidiaphragm in the GE junction region. Recommend further advancement by 2-3 inches to confidently put the side-hole into the stomach. Electronically Signed   By: Ree Molt M.D.   On: 06/01/2024 17:14   ECHOCARDIOGRAM COMPLETE Result Date: 06/01/2024    ECHOCARDIOGRAM REPORT   Patient Name:   MARCELLOUS SNARSKI Date of Exam: 06/01/2024 Medical Rec #:  [id number]    Height:       66.0 in Accession #:    [id number]   Weight:       172.0 lb Date of Birth:  Jun 01, 1932   BSA:          1.876 m Patient Age:    89 years     BP:           126/85 mmHg Patient Gender: M            HR:           107 bpm. Exam Location:  [facility] Procedure: 2D Echo, Cardiac Doppler and Color Doppler (Both Spectral and Color            Flow Doppler were utilized during procedure). Indications:  A-fib  History:        Patient has no prior history of Echocardiogram examinations.                 Arrythmias:Atrial  Fibrillation; Risk Factors:Hypertension.  Sonographer:    Vella Key Referring Phys: Clarissa.Climes DAVID TAT IMPRESSIONS  1. Challenging study in the setting of tachycardia, HR 120-130s.  2. Left ventricular ejection fraction, by estimation, is 30 to 35%. The left ventricle has moderately decreased function. Left ventricular endocardial border not optimally defined to evaluate regional wall motion. Left ventricular diastolic function could not be evaluated.  3. Right ventricular systolic function was not well visualized. The right ventricular size is moderately enlarged. There is normal pulmonary artery systolic pressure.  4. Left atrial size was severely dilated.  5. Right atrial size was severely dilated.  6. The mitral valve is grossly normal. Mild mitral valve regurgitation. No evidence of mitral stenosis.  7. The aortic valve was not well visualized. Aortic valve regurgitation is not visualized. No aortic stenosis is present.  8. Aortic dilatation noted. There is mild dilatation of the aortic root, measuring 40 mm.  9. The inferior vena cava is dilated in size with >50% respiratory variability, suggesting right atrial pressure of 8 mmHg. Comparison(s): No prior Echocardiogram. FINDINGS  Left Ventricle: Left ventricular ejection fraction, by estimation, is 30 to 35%. The left ventricle has moderately decreased function. Left ventricular endocardial border not optimally defined to evaluate regional wall motion. Strain was performed and the global longitudinal strain is indeterminate. The left ventricular internal cavity size was normal in size. There is no left ventricular hypertrophy. Left ventricular diastolic function could not be evaluated due to atrial fibrillation. Left ventricular diastolic function could not be evaluated. Right Ventricle: The right ventricular size is moderately enlarged. No increase in right ventricular wall thickness. Right ventricular systolic function was not well visualized. There is normal  pulmonary artery systolic pressure. The tricuspid regurgitant velocity is 2.60 m/s, and with an assumed right atrial pressure of 8 mmHg, the estimated right ventricular systolic pressure is 35.0 mmHg. Left Atrium: Left atrial size was severely dilated. Right Atrium: Right atrial size was severely dilated. Pericardium: There is no evidence of pericardial effusion. Mitral Valve: The mitral valve is grossly normal. Mild mitral valve regurgitation. No evidence of mitral valve stenosis. Tricuspid Valve: The tricuspid valve is normal in structure. Tricuspid valve regurgitation is mild . No evidence of tricuspid stenosis. Aortic Valve: The aortic valve was not well visualized. Aortic valve regurgitation is not visualized. No aortic stenosis is present. Pulmonic Valve: The pulmonic valve was not well visualized. Pulmonic valve regurgitation is not visualized. No evidence of pulmonic stenosis. Aorta: Aortic dilatation noted. There is mild dilatation of the aortic root, measuring 40 mm. Venous: The inferior vena cava is dilated in size with greater than 50% respiratory variability, suggesting right atrial pressure of 8 mmHg. IAS/Shunts: No atrial level shunt detected by color flow Doppler. Additional Comments: 3D was performed not requiring image post processing on an independent workstation and was indeterminate.  LEFT VENTRICLE PLAX 2D LVIDd:         4.80 cm      Diastology LVIDs:         4.20 cm      LV e' medial:    4.35 cm/s LV PW:         1.20 cm      LV E/e' medial:  26.9 LV IVS:  1.10 cm      LV e' lateral:   3.92 cm/s LVOT diam:     1.70 cm      LV E/e' lateral: 29.8 LV SV:         25 LV SV Index:   13 LVOT Area:     2.27 cm  LV Volumes (MOD) LV vol d, MOD A2C: 111.0 ml LV vol d, MOD A4C: 96.9 ml LV vol s, MOD A2C: 58.0 ml LV vol s, MOD A4C: 51.7 ml LV SV MOD A2C:     53.0 ml LV SV MOD A4C:     96.9 ml LV SV MOD BP:      51.5 ml RIGHT VENTRICLE RV Basal diam:  4.60 cm RV S prime:     8.49 cm/s TAPSE (M-mode):  1.2 cm LEFT ATRIUM              Index        RIGHT ATRIUM           Index LA diam:        6.40 cm  3.41 cm/m   RA Area:     26.50 cm LA Vol (A2C):   111.0 ml 59.18 ml/m  RA Volume:   87.50 ml  46.65 ml/m LA Vol (A4C):   108.0 ml 57.58 ml/m LA Biplane Vol: 120.0 ml 63.98 ml/m  AORTIC VALVE LVOT Vmax:   64.60 cm/s LVOT Vmean:  48.900 cm/s LVOT VTI:    0.110 m  AORTA Ao Root diam: 4.00 cm MITRAL VALVE                TRICUSPID VALVE MV Area (PHT): 12.64 cm    TV Peak grad:   27.0 mmHg MV Decel Time: 60 msec      TV Vmax:        2.60 m/s MV E velocity: 117.00 cm/s  TR Peak grad:   27.0 mmHg MV A velocity: 50.70 cm/s   TR Vmax:        260.00 cm/s MV E/A ratio:  2.31                             SHUNTS                             Systemic VTI:  0.11 m                             Systemic Diam: 1.70 cm Vishnu Priya Mallipeddi Electronically signed by Diannah Late Mallipeddi Signature Date/Time: 06/01/2024/4:13:53 PM    Final    DG Abdomen 1 View Result Date: 06/01/2024 CLINICAL DATA:  NG tube placement EXAM: ABDOMEN - 1 VIEW COMPARISON:  06/01/2024 FINDINGS: Weighted feeding tube terminates in the distended stomach. Redemonstrated findings of small-bowel obstruction. No pneumoperitoneum. No organomegaly or radiopaque calculi. Osteopenia. Bilateral hip arthroplasties. Cardiomegaly. Sternotomy wires and CABG markers. IMPRESSION: Weighted feeding tube terminates in the distended stomach. Electronically Signed   By: Rogelia Myers M.D.   On: 06/01/2024 13:54   CT ABDOMEN PELVIS W CONTRAST Result Date: 06/01/2024 CLINICAL DATA:  Abdominal pain, acute nonlocalized.  Vomiting blood. EXAM: CT ABDOMEN AND PELVIS WITH CONTRAST TECHNIQUE: Multidetector CT imaging of the abdomen and pelvis was performed using the standard protocol following bolus administration of intravenous contrast. RADIATION DOSE REDUCTION: This exam was performed according  to the departmental dose-optimization program which includes automated exposure  control, adjustment of the mA and/or kV according to patient size and/or use of iterative reconstruction technique. CONTRAST:  OMNIPAQUE  IOHEXOL  300 MG/ML  SOLN COMPARISON:  CT chest 05/02/2024 FINDINGS: Lower chest: Chronic scarring at the lung bases. Known adenopathy in the right hilum. See results of previous chest CT. Hepatobiliary: Cirrhosis of the liver with enlarged left lobe and nodular surface of the liver. No sign of liver mass. Multiple calcified gallstones without CT evidence of cholecystitis or obstruction. Pancreas: Normal Spleen: Normal Adrenals/Urinary Tract: Adrenal glands are normal. No renal obstruction or worrisome lesion. Simple appearing cysts. Bladder poorly seen because of artifact from hip replacements. Stomach/Bowel: Fluid distension of the stomach and small intestine to the midportion of the small intestine, with relative collapse of the small intestine distal to that. Findings consistent with high-grade small bowel obstruction. Specific etiology not specifically elucidated. Ordinary diverticulosis of the left colon without evidence of diverticulitis. Vascular/Lymphatic: Aortic atherosclerosis. No aneurysm. IVC is normal. No adenopathy. Reproductive: Normal other than enlarged prostate. Other: No free fluid or air. Musculoskeletal: subacute compression fracture of T12 with loss of height of 50%. No gross retropulsion. This was present in May. IMPRESSION: 1. High-grade small bowel obstruction with fluid distension of the stomach and small intestine to the midportion of the small intestine, with relative collapse of the small intestine distal to that. Specific etiology not elucidated. 2. Cirrhosis of the liver. 3. Cholelithiasis without CT evidence of cholecystitis or obstruction. 4. Aortic atherosclerosis. 5. Subacute compression fracture of T12 with loss of height of 50%. No gross retropulsion. This was present in May. 6. Known adenopathy in the right hilum. See results of previous  chest CT. Aortic Atherosclerosis (ICD10-I70.0). Electronically Signed   By: Oneil Officer M.D.   On: 06/01/2024 11:33   DG Chest Port 1 View Result Date: 06/01/2024 EXAM: 1 VIEW XRAY OF THE CHEST 06/01/2024 10:10:00 AM COMPARISON: AP radiograph of the chest dated 06/09/2022. CLINICAL HISTORY: [id number] GI bleed [id number]. Pt bib ems from home with reports of vomiting blood. Family reports it looked like coffee ground emesis. Onset yesterday afternoon. Abdomen hard and distended which is new per family. Last chemo 2 days ago. Complaining of chest pain, reports that is ; where his cancer is. Afib on monitor. Given 4mg  zofran  en route. ; 120/60; HR 100-130; 93% RA. Hx of afib, [name], hypertension, CABG. Former smoker FINDINGS: LUNGS AND PLEURA: Coarse reticular opacities present within the mid and lower lung zones, which appear chronic. No focal pulmonary opacity. No pulmonary edema. No pleural effusion. No pneumothorax. HEART AND MEDIASTINUM: The heart is enlarged. The patient is status post CABG. No acute abnormality of the cardiac and mediastinal silhouettes. BONES AND SOFT TISSUES: No acute osseous abnormality. A left subclavian chest border is present as before. IMPRESSION: 1. No acute findings. 2. Enlarged heart and status post CABG. 3. Chronic coarse reticular opacities in the mid and lower lung zones. Electronically signed by: Evalene Coho MD 06/01/2024 10:30 AM EDT RP Workstation: [id number]     TODAY-DAY OF DISCHARGE:  Subjective:   Elgin Redo today has no headache,no chest abdominal pain,no new weakness tingling or numbness, feels much better wants to go home today.   Objective:   Blood pressure 130/66, pulse 87, temperature 98.6 F (37 C), temperature source Oral, resp. rate (!) 22, height 5' 6 (1.676 m), weight 77.3 kg, SpO2 94%.  Intake/Output Summary (Last 24 hours) at 06/10/2024 9048 Last data filed  at 06/10/2024 0414 Gross per 24 hour  Intake --  Output 1500 ml  Net -1500 ml   Filed  Weights   06/06/24 1456 06/09/24 0433 06/10/24 0500  Weight: 71.8 kg 75.3 kg 77.3 kg    Exam: Awake Alert, Oriented *3, No new F.N deficits, Normal affect [place].AT,PERRAL Supple Neck,No JVD, No cervical lymphadenopathy appriciated.  Symmetrical Chest wall movement, Good air movement bilaterally, CTAB RRR,No Gallops,Rubs or new Murmurs, No Parasternal Heave +ve B.Sounds, Abd Soft, Non tender, No organomegaly appriciated, No rebound -guarding or rigidity. No Cyanosis, Clubbing or edema, No new Rash or bruise   PERTINENT RADIOLOGIC STUDIES: DG Abd Portable 1V Result Date: 06/08/2024 CLINICAL DATA:  [id number] SBO (small bowel obstruction) (HCC) [id number] EXAM: PORTABLE ABDOMEN - 1 VIEW COMPARISON:  06/07/2024. FINDINGS: There is continued forward propagation of positive contrast with near complete clearing of contrast in the small bowel loops. There are a few mildly dilated small bowel loops overall appears less distended than the prior radiograph. Findings favor improving partial small bowel obstruction. No evidence of pneumoperitoneum, within the limitations of a supine film. No acute osseous abnormalities. The soft tissues are within normal limits. Surgical changes, devices, tubes and lines: Bilateral hip arthroplasty noted. IMPRESSION: *Findings favoring improving partial small bowel obstruction. Electronically Signed   By: Ree Molt M.D.   On: 06/08/2024 10:38     PERTINENT LAB RESULTS: CBC: Recent Labs    06/09/24 0302 06/10/24 0253  WBC 13.3* 13.1*  HGB 9.9* 9.9*  HCT 30.1* 30.3*  PLT 103* 91*   CMET CMP     Component Value Date/Time   NA 140 06/09/2024 0302   NA 142 05/22/2022 0955   K 4.0 06/09/2024 0302   CL 105 06/09/2024 0302   CO2 26 06/09/2024 0302   GLUCOSE 136 (H) 06/09/2024 0302   BUN 23 06/09/2024 0302   BUN 13 05/22/2022 0955   CREATININE 0.79 06/09/2024 0302   CREATININE 1.10 05/08/2013 0938   CALCIUM  8.1 (L) 06/09/2024 0302   PROT 4.8 (L) 06/08/2024 0006    PROT 7.0 05/22/2022 0955   ALBUMIN 2.1 (L) 06/09/2024 0302   ALBUMIN 4.2 05/22/2022 0955   AST 44 (H) 06/08/2024 0006   ALT 46 (H) 06/08/2024 0006   ALKPHOS 119 06/08/2024 0006   BILITOT 0.6 06/08/2024 0006   BILITOT 0.6 05/22/2022 0955   EGFR 64 05/22/2022 0955   GFRNONAA >60 06/09/2024 0302   GFRNONAA 64 05/08/2013 0938    GFR Estimated Creatinine Clearance: 61.3 mL/min (by C-G formula based on SCr of 0.79 mg/dL). No results for input(s): LIPASE, AMYLASE in the last 72 hours. No results for input(s): CKTOTAL, CKMB, CKMBINDEX, TROPONINI in the last 72 hours. Invalid input(s): POCBNP No results for input(s): DDIMER in the last 72 hours. No results for input(s): HGBA1C in the last 72 hours. No results for input(s): CHOL, HDL, LDLCALC, TRIG, CHOLHDL, LDLDIRECT in the last 72 hours. Recent Labs    06/10/24 0253  TSH 1.608   No results for input(s): VITAMINB12, FOLATE, FERRITIN, TIBC, IRON, RETICCTPCT in the last 72 hours. Coags: No results for input(s): INR in the last 72 hours.  Invalid input(s): PT Microbiology: Recent Results (from the past 240 hours)  Urine Culture (for pregnant, neutropenic or urologic patients or patients with an indwelling urinary catheter)     Status: Abnormal   Collection Time: 06/01/24  5:50 PM   Specimen: Urine, Clean Catch  Result Value Ref Range Status   Specimen Description   Final  URINE, CLEAN CATCH Performed at [facility], [address] [place], [place] [address]    Special Requests   Final    Immunocompromised Performed at [facility], [address] [place], [place] [address]    Culture (A)  Final    <10,000 COLONIES/mL INSIGNIFICANT GROWTH Performed at [facility], [address], [place], [place] [address]    Report Status 06/02/2024 FINAL  Final  Culture, blood (Routine X 2) w Reflex to ID Panel     Status: None   Collection Time: 06/01/24  6:14 PM   Specimen: BLOOD   Result Value Ref Range Status   Specimen Description BLOOD RIGHT ANTECUBITAL  Final   Special Requests   Final    BOTTLES DRAWN AEROBIC ONLY Blood Culture adequate volume   Culture   Final    NO GROWTH 6 DAYS Performed at [facility], [address], [place], [place] [address]    Report Status 06/07/2024 FINAL  Final  Culture, blood (Routine X 2) w Reflex to ID Panel     Status: None   Collection Time: 06/01/24  6:14 PM   Specimen: BLOOD  Result Value Ref Range Status   Specimen Description BLOOD RIGHT ANTECUBITAL  Final   Special Requests   Final    BOTTLES DRAWN AEROBIC AND ANAEROBIC Blood Culture results may not be optimal due to an inadequate volume of blood received in culture bottles   Culture   Final    NO GROWTH 6 DAYS Performed at [facility], [address] [place], [place] [address]    Report Status 06/07/2024 FINAL  Final  MRSA Next Gen by PCR, Nasal     Status: None   Collection Time: 06/01/24  6:38 PM   Specimen: Nasal Mucosa; Nasal Swab  Result Value Ref Range Status   MRSA by PCR Next Gen NOT DETECTED NOT DETECTED Final    Comment: (NOTE) The GeneXpert MRSA Assay (FDA approved for NASAL specimens only), is one component of a comprehensive MRSA colonization surveillance program. It is not intended to diagnose MRSA infection nor to guide or monitor treatment for MRSA infections. Test performance is not FDA approved in patients less than 64 years old. Performed at [facility], [address], [place], [place] [address]     FURTHER DISCHARGE INSTRUCTIONS:  Get Medicines reviewed and adjusted: Please take all your medications with you for your next visit with your Primary MD  Laboratory/radiological data: Please request your Primary MD to go over all hospital tests and procedure/radiological results at the follow up, please ask your Primary MD to get all Hospital records sent to his/her office.  In some cases, they will be blood work, cultures and biopsy  results pending at the time of your discharge. Please request that your primary care M.D. goes through all the records of your hospital data and follows up on these results.  Also Note the following: If you experience worsening of your admission symptoms, develop shortness of breath, life threatening emergency, suicidal or homicidal thoughts you must seek medical attention immediately by calling 911 or calling your MD immediately  if symptoms less severe.  You must read complete instructions/literature along with all the possible adverse reactions/side effects for all the Medicines you take and that have been prescribed to you. Take any new Medicines after you have completely understood and accpet all the possible adverse reactions/side effects.   Do not drive when taking Pain medications or sleeping medications (Benzodaizepines)  Do not  take more than prescribed Pain, Sleep and Anxiety Medications. It is not advisable to combine anxiety,sleep and pain medications without talking with your primary care practitioner  Special Instructions: If you have smoked or chewed Tobacco  in the last 2 yrs please stop smoking, stop any regular Alcohol   and or any Recreational drug use.  Wear Seat belts while driving.  Please note: You were cared for by a hospitalist during your hospital stay. Once you are discharged, your primary care physician will handle any further medical issues. Please note that NO REFILLS for any discharge medications will be authorized once you are discharged, as it is imperative that you return to your primary care physician (or establish a relationship with a primary care physician if you do not have one) for your post hospital discharge needs so that they can reassess your need for medications and monitor your lab values.  Total Time spent coordinating discharge including counseling, education and face to face time equals greater than 30 minutes.  SignedBETHA Donalda Applebaum 06/10/2024 9:51 AM

## 2024-06-13 DIAGNOSIS — H02051 Trichiasis without entropian right upper eyelid: Secondary | ICD-10-CM | POA: Diagnosis not present

## 2024-06-14 ENCOUNTER — Other Ambulatory Visit (HOSPITAL_BASED_OUTPATIENT_CLINIC_OR_DEPARTMENT_OTHER): Payer: Self-pay

## 2024-06-14 DIAGNOSIS — E440 Moderate protein-calorie malnutrition: Secondary | ICD-10-CM | POA: Diagnosis not present

## 2024-06-14 DIAGNOSIS — Z5982 Transportation insecurity: Secondary | ICD-10-CM | POA: Diagnosis not present

## 2024-06-14 DIAGNOSIS — Z951 Presence of aortocoronary bypass graft: Secondary | ICD-10-CM | POA: Diagnosis not present

## 2024-06-14 DIAGNOSIS — C9110 Chronic lymphocytic leukemia of B-cell type not having achieved remission: Secondary | ICD-10-CM | POA: Diagnosis not present

## 2024-06-14 DIAGNOSIS — Z96643 Presence of artificial hip joint, bilateral: Secondary | ICD-10-CM | POA: Diagnosis not present

## 2024-06-14 DIAGNOSIS — I5022 Chronic systolic (congestive) heart failure: Secondary | ICD-10-CM | POA: Diagnosis not present

## 2024-06-14 DIAGNOSIS — Z556 Problems related to health literacy: Secondary | ICD-10-CM | POA: Diagnosis not present

## 2024-06-14 DIAGNOSIS — C4442 Squamous cell carcinoma of skin of scalp and neck: Secondary | ICD-10-CM | POA: Diagnosis not present

## 2024-06-14 DIAGNOSIS — N1831 Chronic kidney disease, stage 3a: Secondary | ICD-10-CM | POA: Diagnosis not present

## 2024-06-14 DIAGNOSIS — I4821 Permanent atrial fibrillation: Secondary | ICD-10-CM | POA: Diagnosis not present

## 2024-06-14 DIAGNOSIS — Z7901 Long term (current) use of anticoagulants: Secondary | ICD-10-CM | POA: Diagnosis not present

## 2024-06-14 DIAGNOSIS — I130 Hypertensive heart and chronic kidney disease with heart failure and stage 1 through stage 4 chronic kidney disease, or unspecified chronic kidney disease: Secondary | ICD-10-CM | POA: Diagnosis not present

## 2024-06-14 DIAGNOSIS — K5669 Other partial intestinal obstruction: Secondary | ICD-10-CM | POA: Diagnosis not present

## 2024-06-14 DIAGNOSIS — M4854XD Collapsed vertebra, not elsewhere classified, thoracic region, subsequent encounter for fracture with routine healing: Secondary | ICD-10-CM | POA: Diagnosis not present

## 2024-06-14 DIAGNOSIS — I4721 Torsades de pointes: Secondary | ICD-10-CM | POA: Diagnosis not present

## 2024-06-14 DIAGNOSIS — I2510 Atherosclerotic heart disease of native coronary artery without angina pectoris: Secondary | ICD-10-CM | POA: Diagnosis not present

## 2024-06-14 DIAGNOSIS — K7460 Unspecified cirrhosis of liver: Secondary | ICD-10-CM | POA: Diagnosis not present

## 2024-06-19 DIAGNOSIS — Z7901 Long term (current) use of anticoagulants: Secondary | ICD-10-CM | POA: Diagnosis not present

## 2024-06-19 DIAGNOSIS — I4821 Permanent atrial fibrillation: Secondary | ICD-10-CM | POA: Diagnosis not present

## 2024-06-19 DIAGNOSIS — I4721 Torsades de pointes: Secondary | ICD-10-CM | POA: Diagnosis not present

## 2024-06-19 DIAGNOSIS — Z556 Problems related to health literacy: Secondary | ICD-10-CM | POA: Diagnosis not present

## 2024-06-19 DIAGNOSIS — Z96643 Presence of artificial hip joint, bilateral: Secondary | ICD-10-CM | POA: Diagnosis not present

## 2024-06-19 DIAGNOSIS — C9110 Chronic lymphocytic leukemia of B-cell type not having achieved remission: Secondary | ICD-10-CM | POA: Diagnosis not present

## 2024-06-19 DIAGNOSIS — I2510 Atherosclerotic heart disease of native coronary artery without angina pectoris: Secondary | ICD-10-CM | POA: Diagnosis not present

## 2024-06-19 DIAGNOSIS — I130 Hypertensive heart and chronic kidney disease with heart failure and stage 1 through stage 4 chronic kidney disease, or unspecified chronic kidney disease: Secondary | ICD-10-CM | POA: Diagnosis not present

## 2024-06-19 DIAGNOSIS — C4442 Squamous cell carcinoma of skin of scalp and neck: Secondary | ICD-10-CM | POA: Diagnosis not present

## 2024-06-19 DIAGNOSIS — K5669 Other partial intestinal obstruction: Secondary | ICD-10-CM | POA: Diagnosis not present

## 2024-06-19 DIAGNOSIS — N1831 Chronic kidney disease, stage 3a: Secondary | ICD-10-CM | POA: Diagnosis not present

## 2024-06-19 DIAGNOSIS — Z951 Presence of aortocoronary bypass graft: Secondary | ICD-10-CM | POA: Diagnosis not present

## 2024-06-19 DIAGNOSIS — I5022 Chronic systolic (congestive) heart failure: Secondary | ICD-10-CM | POA: Diagnosis not present

## 2024-06-19 DIAGNOSIS — M4854XD Collapsed vertebra, not elsewhere classified, thoracic region, subsequent encounter for fracture with routine healing: Secondary | ICD-10-CM | POA: Diagnosis not present

## 2024-06-19 DIAGNOSIS — K7460 Unspecified cirrhosis of liver: Secondary | ICD-10-CM | POA: Diagnosis not present

## 2024-06-19 DIAGNOSIS — E440 Moderate protein-calorie malnutrition: Secondary | ICD-10-CM | POA: Diagnosis not present

## 2024-06-19 DIAGNOSIS — Z5982 Transportation insecurity: Secondary | ICD-10-CM | POA: Diagnosis not present

## 2024-06-20 ENCOUNTER — Inpatient Hospital Stay: Admitting: Oncology

## 2024-06-20 ENCOUNTER — Ambulatory Visit

## 2024-06-20 ENCOUNTER — Inpatient Hospital Stay

## 2024-06-21 ENCOUNTER — Inpatient Hospital Stay

## 2024-06-22 DIAGNOSIS — E440 Moderate protein-calorie malnutrition: Secondary | ICD-10-CM | POA: Diagnosis not present

## 2024-06-22 DIAGNOSIS — N1831 Chronic kidney disease, stage 3a: Secondary | ICD-10-CM | POA: Diagnosis not present

## 2024-06-22 DIAGNOSIS — Z5982 Transportation insecurity: Secondary | ICD-10-CM | POA: Diagnosis not present

## 2024-06-22 DIAGNOSIS — I130 Hypertensive heart and chronic kidney disease with heart failure and stage 1 through stage 4 chronic kidney disease, or unspecified chronic kidney disease: Secondary | ICD-10-CM | POA: Diagnosis not present

## 2024-06-22 DIAGNOSIS — C4442 Squamous cell carcinoma of skin of scalp and neck: Secondary | ICD-10-CM | POA: Diagnosis not present

## 2024-06-22 DIAGNOSIS — Z556 Problems related to health literacy: Secondary | ICD-10-CM | POA: Diagnosis not present

## 2024-06-22 DIAGNOSIS — C9110 Chronic lymphocytic leukemia of B-cell type not having achieved remission: Secondary | ICD-10-CM | POA: Diagnosis not present

## 2024-06-22 DIAGNOSIS — Z951 Presence of aortocoronary bypass graft: Secondary | ICD-10-CM | POA: Diagnosis not present

## 2024-06-22 DIAGNOSIS — I5022 Chronic systolic (congestive) heart failure: Secondary | ICD-10-CM | POA: Diagnosis not present

## 2024-06-22 DIAGNOSIS — I4721 Torsades de pointes: Secondary | ICD-10-CM | POA: Diagnosis not present

## 2024-06-22 DIAGNOSIS — I2510 Atherosclerotic heart disease of native coronary artery without angina pectoris: Secondary | ICD-10-CM | POA: Diagnosis not present

## 2024-06-22 DIAGNOSIS — I4821 Permanent atrial fibrillation: Secondary | ICD-10-CM | POA: Diagnosis not present

## 2024-06-22 DIAGNOSIS — K7460 Unspecified cirrhosis of liver: Secondary | ICD-10-CM | POA: Diagnosis not present

## 2024-06-22 DIAGNOSIS — Z7901 Long term (current) use of anticoagulants: Secondary | ICD-10-CM | POA: Diagnosis not present

## 2024-06-22 DIAGNOSIS — Z96643 Presence of artificial hip joint, bilateral: Secondary | ICD-10-CM | POA: Diagnosis not present

## 2024-06-22 DIAGNOSIS — M4854XD Collapsed vertebra, not elsewhere classified, thoracic region, subsequent encounter for fracture with routine healing: Secondary | ICD-10-CM | POA: Diagnosis not present

## 2024-06-22 DIAGNOSIS — K5669 Other partial intestinal obstruction: Secondary | ICD-10-CM | POA: Diagnosis not present

## 2024-06-23 ENCOUNTER — Ambulatory Visit: Admitting: Physician Assistant

## 2024-06-26 ENCOUNTER — Ambulatory Visit: Admitting: Family Medicine

## 2024-06-26 DIAGNOSIS — E440 Moderate protein-calorie malnutrition: Secondary | ICD-10-CM | POA: Diagnosis not present

## 2024-06-26 DIAGNOSIS — Z556 Problems related to health literacy: Secondary | ICD-10-CM | POA: Diagnosis not present

## 2024-06-26 DIAGNOSIS — I4721 Torsades de pointes: Secondary | ICD-10-CM | POA: Diagnosis not present

## 2024-06-26 DIAGNOSIS — Z951 Presence of aortocoronary bypass graft: Secondary | ICD-10-CM | POA: Diagnosis not present

## 2024-06-26 DIAGNOSIS — I4821 Permanent atrial fibrillation: Secondary | ICD-10-CM | POA: Diagnosis not present

## 2024-06-26 DIAGNOSIS — N1831 Chronic kidney disease, stage 3a: Secondary | ICD-10-CM | POA: Diagnosis not present

## 2024-06-26 DIAGNOSIS — K7460 Unspecified cirrhosis of liver: Secondary | ICD-10-CM | POA: Diagnosis not present

## 2024-06-26 DIAGNOSIS — I130 Hypertensive heart and chronic kidney disease with heart failure and stage 1 through stage 4 chronic kidney disease, or unspecified chronic kidney disease: Secondary | ICD-10-CM | POA: Diagnosis not present

## 2024-06-26 DIAGNOSIS — C4442 Squamous cell carcinoma of skin of scalp and neck: Secondary | ICD-10-CM | POA: Diagnosis not present

## 2024-06-26 DIAGNOSIS — C9110 Chronic lymphocytic leukemia of B-cell type not having achieved remission: Secondary | ICD-10-CM | POA: Diagnosis not present

## 2024-06-26 DIAGNOSIS — Z5982 Transportation insecurity: Secondary | ICD-10-CM | POA: Diagnosis not present

## 2024-06-26 DIAGNOSIS — Z7901 Long term (current) use of anticoagulants: Secondary | ICD-10-CM | POA: Diagnosis not present

## 2024-06-26 DIAGNOSIS — Z96643 Presence of artificial hip joint, bilateral: Secondary | ICD-10-CM | POA: Diagnosis not present

## 2024-06-26 DIAGNOSIS — I2510 Atherosclerotic heart disease of native coronary artery without angina pectoris: Secondary | ICD-10-CM | POA: Diagnosis not present

## 2024-06-26 DIAGNOSIS — K5669 Other partial intestinal obstruction: Secondary | ICD-10-CM | POA: Diagnosis not present

## 2024-06-26 DIAGNOSIS — M4854XD Collapsed vertebra, not elsewhere classified, thoracic region, subsequent encounter for fracture with routine healing: Secondary | ICD-10-CM | POA: Diagnosis not present

## 2024-06-26 DIAGNOSIS — I5022 Chronic systolic (congestive) heart failure: Secondary | ICD-10-CM | POA: Diagnosis not present

## 2024-06-28 ENCOUNTER — Other Ambulatory Visit: Payer: Self-pay | Admitting: Oncology

## 2024-06-28 DIAGNOSIS — Z95828 Presence of other vascular implants and grafts: Secondary | ICD-10-CM

## 2024-06-28 DIAGNOSIS — C4442 Squamous cell carcinoma of skin of scalp and neck: Secondary | ICD-10-CM

## 2024-06-29 DIAGNOSIS — Z556 Problems related to health literacy: Secondary | ICD-10-CM | POA: Diagnosis not present

## 2024-06-29 DIAGNOSIS — I4721 Torsades de pointes: Secondary | ICD-10-CM | POA: Diagnosis not present

## 2024-06-29 DIAGNOSIS — N1831 Chronic kidney disease, stage 3a: Secondary | ICD-10-CM | POA: Diagnosis not present

## 2024-06-29 DIAGNOSIS — Z96643 Presence of artificial hip joint, bilateral: Secondary | ICD-10-CM | POA: Diagnosis not present

## 2024-06-29 DIAGNOSIS — C4442 Squamous cell carcinoma of skin of scalp and neck: Secondary | ICD-10-CM | POA: Diagnosis not present

## 2024-06-29 DIAGNOSIS — Z5982 Transportation insecurity: Secondary | ICD-10-CM | POA: Diagnosis not present

## 2024-06-29 DIAGNOSIS — I2510 Atherosclerotic heart disease of native coronary artery without angina pectoris: Secondary | ICD-10-CM | POA: Diagnosis not present

## 2024-06-29 DIAGNOSIS — E440 Moderate protein-calorie malnutrition: Secondary | ICD-10-CM | POA: Diagnosis not present

## 2024-06-29 DIAGNOSIS — I130 Hypertensive heart and chronic kidney disease with heart failure and stage 1 through stage 4 chronic kidney disease, or unspecified chronic kidney disease: Secondary | ICD-10-CM | POA: Diagnosis not present

## 2024-06-29 DIAGNOSIS — M4854XD Collapsed vertebra, not elsewhere classified, thoracic region, subsequent encounter for fracture with routine healing: Secondary | ICD-10-CM | POA: Diagnosis not present

## 2024-06-29 DIAGNOSIS — C9110 Chronic lymphocytic leukemia of B-cell type not having achieved remission: Secondary | ICD-10-CM | POA: Diagnosis not present

## 2024-06-29 DIAGNOSIS — K5669 Other partial intestinal obstruction: Secondary | ICD-10-CM | POA: Diagnosis not present

## 2024-06-29 DIAGNOSIS — I5022 Chronic systolic (congestive) heart failure: Secondary | ICD-10-CM | POA: Diagnosis not present

## 2024-06-29 DIAGNOSIS — Z951 Presence of aortocoronary bypass graft: Secondary | ICD-10-CM | POA: Diagnosis not present

## 2024-06-29 DIAGNOSIS — K7460 Unspecified cirrhosis of liver: Secondary | ICD-10-CM | POA: Diagnosis not present

## 2024-06-29 DIAGNOSIS — Z7901 Long term (current) use of anticoagulants: Secondary | ICD-10-CM | POA: Diagnosis not present

## 2024-06-29 DIAGNOSIS — I4821 Permanent atrial fibrillation: Secondary | ICD-10-CM | POA: Diagnosis not present

## 2024-06-30 ENCOUNTER — Other Ambulatory Visit: Payer: Self-pay | Admitting: Family Medicine

## 2024-06-30 ENCOUNTER — Other Ambulatory Visit: Payer: Self-pay | Admitting: Cardiology

## 2024-06-30 ENCOUNTER — Other Ambulatory Visit (HOSPITAL_COMMUNITY): Payer: Self-pay

## 2024-06-30 MED ORDER — DIGOXIN 125 MCG PO TABS: 0.1250 mg | ORAL_TABLET | Freq: Every day | ORAL | 0 refills | Status: DC

## 2024-06-30 MED ORDER — APIXABAN 5 MG PO TABS: 5.0000 mg | ORAL_TABLET | Freq: Two times a day (BID) | ORAL | 5 refills | Status: AC

## 2024-06-30 NOTE — Telephone Encounter (Signed)
 Pt's medication was sent to pt's pharmacy as requested. Confirmation received.

## 2024-06-30 NOTE — Telephone Encounter (Signed)
 Prescription refill request for Eliquis  received. Indication:afib Last office visit:upcoming Scr:0.79  8/25 Age: 89 Weight:77.3  kg  Prescription refilled

## 2024-06-30 NOTE — Telephone Encounter (Signed)
 Copied from CRM [id number]. Topic: Clinical - Medication Refill >> Jun 30, 2024  9:23 AM Miquel SAILOR wrote: Medication: digoxin  (LANOXIN ) 0.125 MG tablet   Has the patient contacted their pharmacy? Yes (Agent: If no, request that the patient contact the pharmacy for the refill. If patient does not wish to contact the pharmacy document the reason why and proceed with request.) (Agent: If yes, when and what did the pharmacy advise?)  This is the patient's preferred pharmacy:  [facility], [place] - [address] [place] [address] Phone: [phone] Fax: [phone]    Is this the correct pharmacy for this prescription? Yes If no, delete pharmacy and type the correct one.   Has the prescription been filled recently? Yes  Is the patient out of the medication? No 2 pills left  Has the patient been seen for an appointment in the last year OR does the patient have an upcoming appointment? Yes  Can we respond through MyChart? Yes  Agent: Please be advised that Rx refills may take up to 3 business days. We ask that you follow-up with your pharmacy.

## 2024-06-30 NOTE — Telephone Encounter (Signed)
*  STAT* If patient is at the pharmacy, call can be transferred to refill team.   1. Which medications need to be refilled? (please list name of each medication and dose if known)   apixaban  (ELIQUIS ) 5 MG TABS tablet  digoxin  (LANOXIN ) 0.125 MG tablet   2. Which pharmacy/location (including street and city if local pharmacy) is medication to be sent to? [facility] [place], [place] - [address]    3. Do they need a 30 day or 90 day supply?  30 day supply

## 2024-07-03 ENCOUNTER — Other Ambulatory Visit: Payer: Self-pay | Admitting: Cardiology

## 2024-07-03 DIAGNOSIS — I5022 Chronic systolic (congestive) heart failure: Secondary | ICD-10-CM | POA: Diagnosis not present

## 2024-07-03 DIAGNOSIS — E440 Moderate protein-calorie malnutrition: Secondary | ICD-10-CM | POA: Diagnosis not present

## 2024-07-03 DIAGNOSIS — Z5982 Transportation insecurity: Secondary | ICD-10-CM | POA: Diagnosis not present

## 2024-07-03 DIAGNOSIS — K5669 Other partial intestinal obstruction: Secondary | ICD-10-CM | POA: Diagnosis not present

## 2024-07-03 DIAGNOSIS — I4821 Permanent atrial fibrillation: Secondary | ICD-10-CM | POA: Diagnosis not present

## 2024-07-03 DIAGNOSIS — N1831 Chronic kidney disease, stage 3a: Secondary | ICD-10-CM | POA: Diagnosis not present

## 2024-07-03 DIAGNOSIS — Z556 Problems related to health literacy: Secondary | ICD-10-CM | POA: Diagnosis not present

## 2024-07-03 DIAGNOSIS — I2510 Atherosclerotic heart disease of native coronary artery without angina pectoris: Secondary | ICD-10-CM | POA: Diagnosis not present

## 2024-07-03 DIAGNOSIS — Z951 Presence of aortocoronary bypass graft: Secondary | ICD-10-CM | POA: Diagnosis not present

## 2024-07-03 DIAGNOSIS — I130 Hypertensive heart and chronic kidney disease with heart failure and stage 1 through stage 4 chronic kidney disease, or unspecified chronic kidney disease: Secondary | ICD-10-CM | POA: Diagnosis not present

## 2024-07-03 DIAGNOSIS — Z7901 Long term (current) use of anticoagulants: Secondary | ICD-10-CM | POA: Diagnosis not present

## 2024-07-03 DIAGNOSIS — M4854XD Collapsed vertebra, not elsewhere classified, thoracic region, subsequent encounter for fracture with routine healing: Secondary | ICD-10-CM | POA: Diagnosis not present

## 2024-07-03 DIAGNOSIS — K7460 Unspecified cirrhosis of liver: Secondary | ICD-10-CM | POA: Diagnosis not present

## 2024-07-03 DIAGNOSIS — C9110 Chronic lymphocytic leukemia of B-cell type not having achieved remission: Secondary | ICD-10-CM | POA: Diagnosis not present

## 2024-07-03 DIAGNOSIS — C4442 Squamous cell carcinoma of skin of scalp and neck: Secondary | ICD-10-CM | POA: Diagnosis not present

## 2024-07-03 DIAGNOSIS — Z96643 Presence of artificial hip joint, bilateral: Secondary | ICD-10-CM | POA: Diagnosis not present

## 2024-07-03 DIAGNOSIS — I4721 Torsades de pointes: Secondary | ICD-10-CM | POA: Diagnosis not present

## 2024-07-04 ENCOUNTER — Other Ambulatory Visit: Payer: Self-pay | Admitting: Oncology

## 2024-07-04 DIAGNOSIS — Z95828 Presence of other vascular implants and grafts: Secondary | ICD-10-CM

## 2024-07-04 DIAGNOSIS — C4442 Squamous cell carcinoma of skin of scalp and neck: Secondary | ICD-10-CM

## 2024-07-04 NOTE — Progress Notes (Unsigned)
**Note Greg-Identified via Obfuscation**  Cardiology Office Note:   Date:  07/05/2024  ID:  ABBIE [name], DOB [birth year], MRN [id number] PCP: [name], Greg DELENA, MD  [name] Providers Cardiologist:  Greg Schilling, MD {  History of Present Illness:   [name] is a 89 y.o. male who presents for followup of his coronary disease and atrial fibrillation. Lexiscan  Myoview  in June 2018 demonstrated infarct but no ischemia. EF on echo was 40 - 45%. This was done to evaluate chest pain. He returns for follow up.  He was in the hospital with SBO in August and had atrial fib with RVR.  He was switched to a DOAC at that appt.  He was noted to have an EF of 30 - 35% on echo.  I reviewed these records for this visit.    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  The small bowel obstruction was felt to be post chemo.  With the discomfort of that he was in he did have atrial fibrillation at the rate.  He was discharged without Lasix .  He was started on digoxin .  His Toprol  was increased.  This was when he was switched to a DOAC.  He is now being followed by palliative care at home.  He says he is eating okay.  He has lost weight.  The patient denies any new symptoms such as chest discomfort, neck or arm discomfort. There has been no new shortness of breath, PND or orthopnea. There have been no reported palpitations, presyncope or syncope.   Of note I did see in the records that he had an episode of torsades while on amiodarone .   ROS: Neck pain at the site of enlarged lymph node  Studies Reviewed:    EKG:     NA  Risk Assessment/Calculations:    CHA2DS2-VASc Score = 5   This indicates a 7.2% annual risk of stroke. The patient's score is based upon: CHF History: 1 HTN History: 1 Diabetes History: 0 Stroke History: 0 Vascular Disease History: 1 Age Score: 2 Gender Score: 0  Physical Exam:   VS:  BP 100/68   Pulse 66   Ht 5' 6 (1.676 m)   Wt 163 lb (73.9 kg)   SpO2 96%   BMI  26.31 kg/m    Wt Readings from Last 3 Encounters:  07/05/24 163 lb (73.9 kg)  06/10/24 170 lb 6.7 oz (77.3 kg)  05/30/24 172 lb (78 kg)     GEN: Well nourished, well developed in no acute distress NECK: No JVD; No carotid bruits CARDIAC: RRR, 3 out of 6 holosystolic murmur at the apex nonradiating, no diastolic murmurs, rubs, gallops RESPIRATORY:  Clear to auscultation without rales, wheezing or rhonchi  ABDOMEN: Soft, non-tender, non-distended EXTREMITIES:  No edema; No deformity   ASSESSMENT AND PLAN:   [name]:   The patient has no new sypmtoms.  No further cardiovascular testing is indicated.  We will continue with aggressive risk reduction and meds as listed.   CM: His ejection fraction slightly reduced but he seems to be euvolemic.  He is currently off Lasix .  I have given him as needed Lasix  in case he does start to get short of breath.    HTN:  The blood pressure is being managed in the context of treating his cardiomyopathy. 92.  No change in therapy.   ATRIAL FIBRILLATION:   [name] has a CHA2DS2 - VASc score of 4.  He  seems to have good rate control now.  He is tolerating Eliquis .  No change in therapy.  Follow up with me in six months  Signed, Greg Schilling, MD

## 2024-07-05 ENCOUNTER — Encounter: Payer: Self-pay | Admitting: Cardiology

## 2024-07-05 ENCOUNTER — Ambulatory Visit (INDEPENDENT_AMBULATORY_CARE_PROVIDER_SITE_OTHER): Admitting: Cardiology

## 2024-07-05 VITALS — BP 100/68 | HR 66 | Ht 66.0 in | Wt 163.0 lb

## 2024-07-05 DIAGNOSIS — E785 Hyperlipidemia, unspecified: Secondary | ICD-10-CM

## 2024-07-05 DIAGNOSIS — I10 Essential (primary) hypertension: Secondary | ICD-10-CM

## 2024-07-05 DIAGNOSIS — I4819 Other persistent atrial fibrillation: Secondary | ICD-10-CM

## 2024-07-05 DIAGNOSIS — I5042 Chronic combined systolic (congestive) and diastolic (congestive) heart failure: Secondary | ICD-10-CM | POA: Diagnosis not present

## 2024-07-05 DIAGNOSIS — I2510 Atherosclerotic heart disease of native coronary artery without angina pectoris: Secondary | ICD-10-CM | POA: Diagnosis not present

## 2024-07-05 MED ORDER — FUROSEMIDE 20 MG PO TABS: 20.0000 mg | ORAL_TABLET | Freq: Every day | ORAL | 1 refills | Status: AC | PRN

## 2024-07-05 NOTE — Patient Instructions (Addendum)
 Medication Instructions:  Your physician recommends that you continue on your current medications as directed. Please refer to the Current Medication list given to you today. Furosemide  20 mg daily as needed for shortness of breath prescription sent to your pharmacy.  Labwork: none  Testing/Procedures: none  Follow-Up: Your physician recommends that you schedule a follow-up appointment in: 6 months  Any Other Special Instructions Will Be Listed Below (If Applicable).  If you need a refill on your cardiac medications before your next appointment, please call your pharmacy.

## 2024-07-06 DIAGNOSIS — Z951 Presence of aortocoronary bypass graft: Secondary | ICD-10-CM | POA: Diagnosis not present

## 2024-07-06 DIAGNOSIS — E440 Moderate protein-calorie malnutrition: Secondary | ICD-10-CM | POA: Diagnosis not present

## 2024-07-06 DIAGNOSIS — I4721 Torsades de pointes: Secondary | ICD-10-CM | POA: Diagnosis not present

## 2024-07-06 DIAGNOSIS — K5669 Other partial intestinal obstruction: Secondary | ICD-10-CM | POA: Diagnosis not present

## 2024-07-06 DIAGNOSIS — I5022 Chronic systolic (congestive) heart failure: Secondary | ICD-10-CM | POA: Diagnosis not present

## 2024-07-06 DIAGNOSIS — Z96643 Presence of artificial hip joint, bilateral: Secondary | ICD-10-CM | POA: Diagnosis not present

## 2024-07-06 DIAGNOSIS — M4854XD Collapsed vertebra, not elsewhere classified, thoracic region, subsequent encounter for fracture with routine healing: Secondary | ICD-10-CM | POA: Diagnosis not present

## 2024-07-06 DIAGNOSIS — Z7901 Long term (current) use of anticoagulants: Secondary | ICD-10-CM | POA: Diagnosis not present

## 2024-07-06 DIAGNOSIS — I4821 Permanent atrial fibrillation: Secondary | ICD-10-CM | POA: Diagnosis not present

## 2024-07-06 DIAGNOSIS — K7460 Unspecified cirrhosis of liver: Secondary | ICD-10-CM | POA: Diagnosis not present

## 2024-07-06 DIAGNOSIS — C9110 Chronic lymphocytic leukemia of B-cell type not having achieved remission: Secondary | ICD-10-CM | POA: Diagnosis not present

## 2024-07-06 DIAGNOSIS — C4442 Squamous cell carcinoma of skin of scalp and neck: Secondary | ICD-10-CM | POA: Diagnosis not present

## 2024-07-06 DIAGNOSIS — N1831 Chronic kidney disease, stage 3a: Secondary | ICD-10-CM | POA: Diagnosis not present

## 2024-07-06 DIAGNOSIS — Z556 Problems related to health literacy: Secondary | ICD-10-CM | POA: Diagnosis not present

## 2024-07-06 DIAGNOSIS — Z5982 Transportation insecurity: Secondary | ICD-10-CM | POA: Diagnosis not present

## 2024-07-06 DIAGNOSIS — I2510 Atherosclerotic heart disease of native coronary artery without angina pectoris: Secondary | ICD-10-CM | POA: Diagnosis not present

## 2024-07-06 DIAGNOSIS — I130 Hypertensive heart and chronic kidney disease with heart failure and stage 1 through stage 4 chronic kidney disease, or unspecified chronic kidney disease: Secondary | ICD-10-CM | POA: Diagnosis not present

## 2024-07-07 ENCOUNTER — Telehealth: Payer: Self-pay | Admitting: Cardiology

## 2024-07-07 ENCOUNTER — Other Ambulatory Visit: Payer: Self-pay

## 2024-07-07 MED ORDER — METOPROLOL SUCCINATE ER 100 MG PO TB24: 100.0000 mg | ORAL_TABLET | Freq: Every day | ORAL | 3 refills | Status: AC

## 2024-07-07 NOTE — Telephone Encounter (Signed)
*  STAT* If patient is at the pharmacy, call can be transferred to refill team.   1. Which medications need to be refilled? (please list name of each medication and dose if known) metoprolol  succinate (TOPROL -XL) 100 MG 24 hr tablet    2. Would you like to learn more about the convenience, safety, & potential cost savings by using the [facility]?      3. Are you open to using the [facility] (Type [facility].  ).   4. Which pharmacy/location (including street and city if local pharmacy) is medication to be sent to? [facility] [place], [place] - [address]    5. Do they need a 30 day or 90 day supply? 90 day

## 2024-07-07 NOTE — Telephone Encounter (Signed)
 Pt's medication was sent to pt's pharmacy as requested. Confirmation received.

## 2024-07-10 ENCOUNTER — Telehealth: Payer: Self-pay

## 2024-07-10 DIAGNOSIS — Z5982 Transportation insecurity: Secondary | ICD-10-CM | POA: Diagnosis not present

## 2024-07-10 DIAGNOSIS — I4721 Torsades de pointes: Secondary | ICD-10-CM | POA: Diagnosis not present

## 2024-07-10 DIAGNOSIS — I4821 Permanent atrial fibrillation: Secondary | ICD-10-CM | POA: Diagnosis not present

## 2024-07-10 DIAGNOSIS — N1831 Chronic kidney disease, stage 3a: Secondary | ICD-10-CM | POA: Diagnosis not present

## 2024-07-10 DIAGNOSIS — I2510 Atherosclerotic heart disease of native coronary artery without angina pectoris: Secondary | ICD-10-CM | POA: Diagnosis not present

## 2024-07-10 DIAGNOSIS — Z951 Presence of aortocoronary bypass graft: Secondary | ICD-10-CM | POA: Diagnosis not present

## 2024-07-10 DIAGNOSIS — K5669 Other partial intestinal obstruction: Secondary | ICD-10-CM | POA: Diagnosis not present

## 2024-07-10 DIAGNOSIS — K7460 Unspecified cirrhosis of liver: Secondary | ICD-10-CM | POA: Diagnosis not present

## 2024-07-10 DIAGNOSIS — C4442 Squamous cell carcinoma of skin of scalp and neck: Secondary | ICD-10-CM | POA: Diagnosis not present

## 2024-07-10 DIAGNOSIS — Z96643 Presence of artificial hip joint, bilateral: Secondary | ICD-10-CM | POA: Diagnosis not present

## 2024-07-10 DIAGNOSIS — I130 Hypertensive heart and chronic kidney disease with heart failure and stage 1 through stage 4 chronic kidney disease, or unspecified chronic kidney disease: Secondary | ICD-10-CM | POA: Diagnosis not present

## 2024-07-10 DIAGNOSIS — C9110 Chronic lymphocytic leukemia of B-cell type not having achieved remission: Secondary | ICD-10-CM | POA: Diagnosis not present

## 2024-07-10 DIAGNOSIS — Z556 Problems related to health literacy: Secondary | ICD-10-CM | POA: Diagnosis not present

## 2024-07-10 DIAGNOSIS — Z7901 Long term (current) use of anticoagulants: Secondary | ICD-10-CM | POA: Diagnosis not present

## 2024-07-10 DIAGNOSIS — I5022 Chronic systolic (congestive) heart failure: Secondary | ICD-10-CM | POA: Diagnosis not present

## 2024-07-10 DIAGNOSIS — E440 Moderate protein-calorie malnutrition: Secondary | ICD-10-CM | POA: Diagnosis not present

## 2024-07-10 DIAGNOSIS — M4854XD Collapsed vertebra, not elsewhere classified, thoracic region, subsequent encounter for fracture with routine healing: Secondary | ICD-10-CM | POA: Diagnosis not present

## 2024-07-10 NOTE — Telephone Encounter (Signed)
 FYI for PCP    Copied from CRM #[id number]. Topic: Clinical - Home Health Verbal Orders >> Jul 10, 2024  9:38 AM Graeme ORN wrote: Caller/Agency: [facility]  Callback Number: [phone] Service Requested: Physical Therapy Frequency: N/A Any new concerns about the patient? Yes - weight dropping  1st visit 169 lbs  - 2nd visit 163 - today 158 - next follow up not until end of October going in for Chemo tomorrow

## 2024-07-10 NOTE — Telephone Encounter (Signed)
 Have him start doing Ensure or boost twice a day instead of just once a day on top of his normal food.  Also have him add peanut butter to foods as tolerated.

## 2024-07-10 NOTE — Telephone Encounter (Signed)
 Wife made aware of Dr. Williemae recommendations. She believes he lost most of the weight when he was hospital with a bowel obstruction. Wife will call back if needed.

## 2024-07-11 ENCOUNTER — Inpatient Hospital Stay: Attending: Hematology | Admitting: Oncology

## 2024-07-11 ENCOUNTER — Inpatient Hospital Stay

## 2024-07-11 VITALS — BP 121/66 | HR 84 | Temp 97.3°F | Resp 18

## 2024-07-11 DIAGNOSIS — M2550 Pain in unspecified joint: Secondary | ICD-10-CM | POA: Diagnosis not present

## 2024-07-11 DIAGNOSIS — C9110 Chronic lymphocytic leukemia of B-cell type not having achieved remission: Secondary | ICD-10-CM | POA: Diagnosis not present

## 2024-07-11 DIAGNOSIS — C4442 Squamous cell carcinoma of skin of scalp and neck: Secondary | ICD-10-CM

## 2024-07-11 DIAGNOSIS — C778 Secondary and unspecified malignant neoplasm of lymph nodes of multiple regions: Secondary | ICD-10-CM | POA: Diagnosis not present

## 2024-07-11 DIAGNOSIS — Z95828 Presence of other vascular implants and grafts: Secondary | ICD-10-CM

## 2024-07-11 DIAGNOSIS — R530 Neoplastic (malignant) related fatigue: Secondary | ICD-10-CM | POA: Diagnosis not present

## 2024-07-11 DIAGNOSIS — C801 Malignant (primary) neoplasm, unspecified: Secondary | ICD-10-CM | POA: Diagnosis not present

## 2024-07-11 DIAGNOSIS — Z5111 Encounter for antineoplastic chemotherapy: Secondary | ICD-10-CM | POA: Diagnosis not present

## 2024-07-11 DIAGNOSIS — H538 Other visual disturbances: Secondary | ICD-10-CM | POA: Diagnosis not present

## 2024-07-11 DIAGNOSIS — R21 Rash and other nonspecific skin eruption: Secondary | ICD-10-CM | POA: Insufficient documentation

## 2024-07-11 DIAGNOSIS — R918 Other nonspecific abnormal finding of lung field: Secondary | ICD-10-CM | POA: Diagnosis not present

## 2024-07-11 LAB — COMPREHENSIVE METABOLIC PANEL WITH GFR
ALT: 13 U/L (ref 0–44)
AST: 26 U/L (ref 15–41)
Albumin: 3.6 g/dL (ref 3.5–5.0)
Alkaline Phosphatase: 84 U/L (ref 38–126)
Anion gap: 12 (ref 5–15)
BUN: 19 mg/dL (ref 8–23)
CO2: 27 mmol/L (ref 22–32)
Calcium: 9.6 mg/dL (ref 8.9–10.3)
Chloride: 104 mmol/L (ref 98–111)
Creatinine, Ser: 1.09 mg/dL (ref 0.61–1.24)
GFR, Estimated: >60 mL/min (ref >60)
Glucose, Bld: 146 mg/dL — ABNORMAL HIGH (ref 70–99)
Potassium: 4.2 mmol/L (ref 3.5–5.1)
Sodium: 143 mmol/L (ref 135–145)
Total Bilirubin: 0.3 mg/dL (ref 0.0–1.2)
Total Protein: 6.5 g/dL (ref 6.5–8.1)

## 2024-07-11 LAB — CBC WITH DIFFERENTIAL/PLATELET
Abs Immature Granulocytes: 0.15 K/uL — ABNORMAL HIGH (ref 0.00–0.07)
Basophils Absolute: 0.1 K/uL (ref 0.0–0.1)
Basophils Relative: 1 %
Eosinophils Absolute: 0.3 K/uL (ref 0.0–0.5)
Eosinophils Relative: 2 %
HCT: 38.4 % — ABNORMAL LOW (ref 39.0–52.0)
Hemoglobin: 12.5 g/dL — ABNORMAL LOW (ref 13.0–17.0)
Immature Granulocytes: 1 %
Lymphocytes Relative: 41 %
Lymphs Abs: 5.4 K/uL — ABNORMAL HIGH (ref 0.7–4.0)
MCH: 35.1 pg — ABNORMAL HIGH (ref 26.0–34.0)
MCHC: 32.6 g/dL (ref 30.0–36.0)
MCV: 107.9 fL — ABNORMAL HIGH (ref 80.0–100.0)
Monocytes Absolute: 1.1 K/uL — ABNORMAL HIGH (ref 0.1–1.0)
Monocytes Relative: 8 %
Neutro Abs: 6.3 K/uL (ref 1.7–7.7)
Neutrophils Relative %: 47 %
Platelets: 267 K/uL (ref 150–400)
RBC: 3.56 MIL/uL — ABNORMAL LOW (ref 4.22–5.81)
RDW: 14 % (ref 11.5–15.5)
WBC: 13.3 K/uL — ABNORMAL HIGH (ref 4.0–10.5)
nRBC: 0 % (ref 0.0–0.2)

## 2024-07-11 LAB — MAGNESIUM: Magnesium: 2.1 mg/dL (ref 1.7–2.4)

## 2024-07-11 MED ORDER — SODIUM CHLORIDE 0.9 % IV SOLN
150.0000 mg | Freq: Once | INTRAVENOUS | Status: AC
  Administered 2024-07-11: 150 mg via INTRAVENOUS
  Filled 2024-07-11: qty 150

## 2024-07-11 MED ORDER — PALONOSETRON HCL INJECTION 0.25 MG/5ML
0.2500 mg | Freq: Once | INTRAVENOUS | Status: AC
  Administered 2024-07-11: 0.25 mg via INTRAVENOUS
  Filled 2024-07-11: qty 5

## 2024-07-11 MED ORDER — CETIRIZINE HCL 10 MG/ML IV SOLN
10.0000 mg | Freq: Once | INTRAVENOUS | Status: AC
  Administered 2024-07-11: 10 mg via INTRAVENOUS
  Filled 2024-07-11: qty 1

## 2024-07-11 MED ORDER — FAMOTIDINE IN NACL 20-0.9 MG/50ML-% IV SOLN
20.0000 mg | Freq: Once | INTRAVENOUS | Status: AC
  Administered 2024-07-11: 20 mg via INTRAVENOUS
  Filled 2024-07-11: qty 50

## 2024-07-11 MED ORDER — DEXAMETHASONE SODIUM PHOSPHATE 10 MG/ML IJ SOLN
10.0000 mg | Freq: Once | INTRAMUSCULAR | Status: AC
  Administered 2024-07-11: 10 mg via INTRAVENOUS
  Filled 2024-07-11: qty 1

## 2024-07-11 MED ORDER — SODIUM CHLORIDE 0.9 % IV SOLN: INTRAVENOUS | Status: DC

## 2024-07-11 MED ORDER — SODIUM CHLORIDE 0.9 % IV SOLN
400.0000 mg | Freq: Once | INTRAVENOUS | Status: AC
  Administered 2024-07-11: 400 mg via INTRAVENOUS
  Filled 2024-07-11: qty 40

## 2024-07-11 NOTE — Progress Notes (Signed)
 Patient presents today for D1, C3 Carboplatin  infusion. Message received from Dr. Davonna to proceed with treatment. Lab work and vital signs within parameters for treatment.   Patient denies any side effects related to his last treatment.

## 2024-07-11 NOTE — Progress Notes (Signed)
 Patient Care Team: [name], Greg LABOR, MD as PCP - General (Family Medicine) Greg Agent, MD as PCP - Cardiology (Cardiology) Greg Rush, MD as Attending Physician (Urology) Greg Agent, MD as Consulting Physician (Cardiology) Greg Claudis PENNER, MD (Inactive) as Consulting Physician (Gastroenterology) Greg Hamilton, MD as Consulting Physician (Ophthalmology) Greg Ingle, MD as Consulting Physician (Orthopedic Surgery) [name] as [facility] Care Management (Licensed Clinical Social Worker) Greg Joesph SQUIBB, RN as Oncology Nurse Navigator (Medical Oncology)  Clinic Day:  07/11/2024  Referring physician: [name], Greg LABOR, MD   CHIEF COMPLAINT:  CC:  Metastatic squamous cell carcinoma, 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 male was transferred to my care after his prior physician has left.   ASSESSMENT & PLAN:   Assessment & Plan: [name]  is a 89 y.o. 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  Assessment & Plan Squamous cell carcinoma of head and neck Patient with metastatic carcinoma of unknown primary likely head and neck origin Patient had metastatic disease in the supraclavicular region with bilateral pulmonary nodules and right axillary lymphadenopathy 1st line: Carboplatin  plus paclitaxel  plus pembrolizumab  (PD-L1 CPS: 25) with progression in right axillary and subcarinal lymphadenopathy Second line: Cetuximab , with progression in right axillary lymphadenopathy Currently on third line: Single [name] carboplatin  every 3 weeks Recent CT scan with progression of disease and since then had a change in treatment to single [name] carboplatin .   -Patient tolerating treatment well.  Has no chemotherapy induced side effects at this time - Labs reviewed today.  CMP: WNL, CBC: Stable leukocytosis, hemoglobin: 12.5, platelets: 267 - Physical exam stable today.  Proceed with treatment  today. - Repeat CT scan in 3 months.  Will update after completing 4 cycles of chemotherapy.  Due after next cycle of chemotherapy   Return to clinic in 3 weeks with labs prior to next cycle of chemotherapy CLL (chronic lymphocytic leukemia) (HCC) Patient has history of CLL diagnosed in 2023.  RAI stage I No B symptoms   - Does not meet criteria for treatment at this time - Continue to monitor Neoplastic malignant related fatigue Patient has cancer-related fatigue and is on Ritalin  5mg  as needed   - Continue Ritalin  5 mg as needed. Polyarthralgia Patient has a history of polyarthralgia likely secondary to previous treatments Was on prednisone  5 mg daily. Does not report any polyarthralgias at this time.  Can discontinue prednisone . Rash Patient has cetuximab  induced rash. On doxycycline  and clindamycin  gel Cetuximab  discontinued at this time.  Rash significantly improved   - No need of doxycycline  and clindamycin  gel at this time - Continue using moisturizing cream for skin dryness Blurry vision Patient reports blurry vision is a complication of previous cetuximab  infusion   - Continue to follow with ophthalmology      The patient understands the plans discussed today and is in agreement with them.  He knows to contact our office if he develops concerns prior to his next appointment.  40 minutes of total time was spent for this patient encounter, including preparation, face-to-face counseling with the patient and coordination of care, physical exam, and documentation of the encounter. > 50% of the time was spent on counseling as documented under my assessment and plan.    [name],acting as a Neurosurgeon for Greg Dry, MD.,have documented all relevant documentation on the behalf of Greg Dry, MD,as directed by  Greg Dry, MD while in the presence  of Greg Dry, MD.  Greg Greg Dry MD, have reviewed the above documentation for accuracy and  completeness, and I agree with the above.    Greg Dry, MD  [facility] [place] - A DEPT OF [facility] [address] [place] [address] Dept: [phone] Dept Fax: [phone]   No orders of the defined types were placed in this encounter.    ONCOLOGY HISTORY:   I have reviewed his chart and materials related to his cancer extensively and collaborated history with the patient. 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 Solitary pathologic lymph node in the left neck also highly suspicious for metastatic disease. Multiple lung nodule in the a lung apices bilaterally compatible with metastatic disease. -05/28/2022: CT CAP: Large nodal mass in the right supraclavicular region with bilateral pulmonary nodules. In addition, slightly enlarged lymph nodes in the right axilla and right sub pectoralis region. Primary neoplastic source may be located in the right supraclavicular region but etiology is uncertain based on imaging. No evidence for metastatic disease in the abdomen or pelvis. Liver has a slightly nodular contour.  -06/08/2022: Brain MRI: No evidence of mass lesion.  -06/09/2022: Right chest supraclavicular lymph node excision  Pathology: Metastatic poorly differentiated carcinoma.T e metastatic carcinoma is positive with p40, p63,  cytokeratin 5/6 and shows patchy positivity with cytokeratin 7.  GATA3  shows focal, weak likely nonspecific staining. The immunophenotype is most consistent with metastatic squamous cell carcinoma.  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 Met amplification, RET rearrangement, PTEN deletion: Not detected [employer] and [employer] gen[employer]: NGS: TP53  pathogenic variant, MSI-stable, TMB-low, LOH-low, PD-L1 (SP142): Negative, p16 and p18 negative.  HER2 by IHC 0.pan-TRK: Not expressed PD-L1 22C3: CPS score 25 Cancer type ID: 96% probability squamous cell carcinoma, subtype head and neck/skin.  Cancer types ruled out with 95% confidence includes skin basal cell carcinoma.  -06/11/2022: Initial PET: Bulky RIGHT supraclavicular nodal mass with hypermetabolic activity. Findings consistent with metastatic adenopathy. Smaller hypermetabolic RIGHT posterior triangle and jugular lymph nodes. Single hypermetabolic LEFT level 3 lymph node. Bilateral hypermetabolic pulmonary nodules in a metastatic pattern. Number of metastatic pulmonary nodules much greater on the RIGHT. Minimal hypermetabolic mediastinal nodal metastasis. Cluster of hypermetabolic RIGHT axillary lymph nodes. No evidence of metastatic disease or primary lesion within the abdomen pelvis. No evidence skeletal metastasis. -07/23/2022-11/09/2022:  Carboplatin , Taxol  and pembrolizumab   -12/16/2022: CT CAP: Stable size and number of numerous pulmonary nodules in the lungs. No new pulmonary nodules are noted. Slight increased prominence of borderline enlarged right axillary lymph nodes. No signs of new metastatic disease in the abdomen or pelvis. Subtle changes in the lungs concerning for probable interstitial lung disease. -12/16/2022: CT soft tissue neck: Mixed response with some lymph nodes decreased in size and others increased in size from the prior study. -07/16/2023: CT CAP: New mild right axillary and right subcarinal lymphadenopathy. Right paratracheal adenopathy is mildly increased. Stable right hilar adenopathy. Stable peripheral right upper lobe pulmonary nodule. No evidence of metastatic disease in the abdomen or pelvis. Cirrhosis. No ascites. Normal size spleen. -08/04/2023: Right axillary lymph node biopsy. Pathology: Metastatic moderate to poorly differentiated squamous cell carcinoma.   -10/25/2023-05/08/2024: Cetuximab  every 2 weeks, discontinued due to progression -  05/02/2024: CT chest: Progressive right axillary nodal metastasis. Additional thoracic nodal metastases, stable versus mildly improved. -05/02/2024: CT soft tissue neck: Further slight enlargement of lymph nodes in the right level 3/level 4 region and in the lower level 3 region. Stable appearance of lymph nodes on the left. Question of vocal fold paresis on the right, possibly with a small mass or polyp along the under surface of the right vocal fold. -05/08/2024 - current: Single [name] carboplatin  (AUC 5) every 21 days   CLL: RAI stage I: -06/03/2022: Peripheral blood flow cytometry: Monoclonal B-cell lymphocytosis, high count with a CLL immunophenotype.  CD5, CD19, CD20, CD200, lambda: Positive  Current Treatment:  Single [name] carboplatin  (AUC 5) every 21 days   INTERVAL HISTORY:   [name] is here today for follow up. Patient is accompanied by his wife.   Salomon notes right-sided neck soreness and tightness from lymphadenopathy. He reports associated coming and going right shoulder and upper back pain his wife attributes to worsening cancer, as this symptom is similar to when he was first diagnosed. Marteze notes palpable right axillary lymphadenopathy.   He recently had a 10 day hospitalization for Afib and SBO. His wife states he has lost 10 pounds during and after his hospital stay. His appetite has improved since discharge. He denies any abdominal pain and has regular BM's. He notes mild back pain.   I have reviewed the past medical history, past surgical history, social history and family history with the patient and they are unchanged from previous note.  ALLERGIES:  is allergic to penicillins, cetuximab , hct [hydrochlorothiazide], and statins.  MEDICATIONS:  Current Outpatient Medications  Medication Sig Dispense Refill   acetaminophen  (TYLENOL ) 500 MG tablet Take 1,000 mg by mouth every 6 (six)  hours as needed for moderate pain.     apixaban  (ELIQUIS ) 5 MG TABS tablet Take 1 tablet (5 mg total) by mouth 2 (two) times daily. 60 tablet 5   Cholecalciferol (VITAMIN D3) 5000 units CAPS Take 5,000 Units by mouth once a week.      clindamycin  (CLINDAGEL) 1 % gel Apply 1 Application topically daily at 6 (six) AM.     digoxin  (LANOXIN ) 0.125 MG tablet Take 1 tablet (0.125 mg total) by mouth daily. 30 tablet 0   ezetimibe  (ZETIA ) 10 MG tablet TAKE ONE (1) TABLET BY MOUTH EVERY DAY 90 tablet 0   finasteride  (PROSCAR ) 5 MG tablet TAKE ONE (1) TABLET EACH DAY 90 tablet 3   fluocinonide  cream (LIDEX ) 0.05 % Apply 1 Application topically 2 (two) times daily. 60 g 3   fluticasone  (FLONASE ) 50 MCG/ACT nasal spray Place 1 spray into both nostrils daily as needed for allergies or rhinitis.     furosemide  (LASIX ) 20 MG tablet Take 1 tablet (20 mg total) by mouth daily as needed (shortness of breathe). 30 tablet 1   lovastatin  (MEVACOR ) 40 MG tablet Take 1 tablet (40 mg total) by mouth at bedtime. 90 tablet 3   magnesium  oxide (MAG-OX) 400 (240 Mg) MG tablet Take 1 tablet (400 mg total) by mouth 2 (two) times daily. 60 tablet 3   methylphenidate  (RITALIN ) 5 MG tablet TAKE 1 TABLET BY MOUTH DAILY AS NEEDED 30 tablet 0   metoprolol  succinate (TOPROL -XL) 100 MG 24 hr tablet Take 1 tablet (100 mg total) by mouth daily. Take with or immediately following a meal. 90 tablet 3   montelukast  (SINGULAIR ) 10 MG tablet Take 1 tablet (10 mg total) by mouth at bedtime. 90 tablet  3   prochlorperazine  (COMPAZINE ) 10 MG tablet Take 1 tablet (10 mg total) by mouth every 6 (six) hours as needed for nausea or vomiting. 30 tablet 2   tobramycin  (TOBREX ) 0.3 % ophthalmic solution Place 1 drop into both eyes in the morning, at noon, in the evening, and at bedtime.     trimethoprim-polymyxin b  (POLYTRIM) ophthalmic solution SMARTSIG:In Eye(s)     No current facility-administered medications for this visit.    Facility-Administered Medications Ordered in Other Visits  Medication Dose Route Frequency Provider Last Rate Last Admin   0.9 %  sodium chloride  infusion   Intravenous Continuous Renate Danh, MD   Stopped at 07/11/24 1558    REVIEW OF SYSTEMS:   Constitutional: +Fatigue, Denies fevers, chills or abnormal weight loss Eyes: +blurry vision Ears, nose, mouth, throat, and face: Denies mucositis or sore throat Respiratory: Denies cough, dyspnea or wheezes Cardiovascular: Denies palpitation, chest discomfort or lower extremity swelling Gastrointestinal:  Denies nausea, heartburn or change in bowel habits Skin: Denies abnormal skin rashes Lymphatics: Denies new lymphadenopathy or easy bruising Neurological: +Dizziness, +numbness and tingling in bilateral feet Denies new weaknesses Behavioral/Psych: Mood is stable, no new changes  All other systems were reviewed with the patient and are negative.   VITALS:  There were no vitals taken for this visit.  Wt Readings from Last 3 Encounters:  07/11/24 163 lb 5.8 oz (74.1 kg)  07/05/24 163 lb (73.9 kg)  06/10/24 170 lb 6.7 oz (77.3 kg)    There is no height or weight on file to calculate BMI.  Performance status (ECOG): 2 - Symptomatic, <50% confined to bed  PHYSICAL EXAM:   GENERAL:alert, no distress and comfortable SKIN: skin color, texture, turgor are normal, no rashes or significant lesions EYES: normal, Conjunctiva are pink and non-injected, sclera clear OROPHARYNX:no exudate, no erythema and lips, buccal mucosa, and tongue normal  LYMPH:  Palpable right cervical level II lymphadenopathy 4 x 3cms, palpable right axillary lymphadenopathy 9 x 6 cms LUNGS: clear to auscultation and percussion with normal breathing effort HEART: regular rate & rhythm and no murmurs and no lower extremity edema ABDOMEN:abdomen soft, non-tender and normal bowel sounds Musculoskeletal:no cyanosis of digits and no clubbing  NEURO: alert & oriented x  3 with fluent speech, no focal motor/sensory deficits  LABORATORY DATA:  I have reviewed the data as listed    Component Value Date/Time   NA 143 07/11/2024 1131   NA 142 05/22/2022 0955   K 4.2 07/11/2024 1131   CL 104 07/11/2024 1131   CO2 27 07/11/2024 1131   GLUCOSE 146 (H) 07/11/2024 1131   BUN 19 07/11/2024 1131   BUN 13 05/22/2022 0955   CREATININE 1.09 07/11/2024 1131   CREATININE 1.10 05/08/2013 0938   CALCIUM  9.6 07/11/2024 1131   PROT 6.5 07/11/2024 1131   PROT 7.0 05/22/2022 0955   ALBUMIN 3.6 07/11/2024 1131   ALBUMIN 4.2 05/22/2022 0955   AST 26 07/11/2024 1131   ALT 13 07/11/2024 1131   ALKPHOS 84 07/11/2024 1131   BILITOT 0.3 07/11/2024 1131   BILITOT 0.6 05/22/2022 0955   GFRNONAA >60 07/11/2024 1131   GFRNONAA 64 05/08/2013 0938   GFRAA 69 11/27/2020 0852   GFRAA 74 05/08/2013 0938    Lab Results  Component Value Date   WBC 13.3 (H) 07/11/2024   NEUTROABS 6.3 07/11/2024   HGB 12.5 (L) 07/11/2024   HCT 38.4 (L) 07/11/2024   MCV 107.9 (H) 07/11/2024   PLT 267 07/11/2024  RADIOGRAPHIC STUDIES: I have personally reviewed the radiological images as listed and agreed with the findings in the report.  DG Abd Portable 1V CLINICAL DATA:  [id number] SBO (small bowel obstruction) (HCC) [id number]  EXAM: PORTABLE ABDOMEN - 1 VIEW  COMPARISON:  06/07/2024.  FINDINGS: There is continued forward propagation of positive contrast with near complete clearing of contrast in the small bowel loops. There are a few mildly dilated small bowel loops overall appears less distended than the prior radiograph. Findings favor improving partial small bowel obstruction.  No evidence of pneumoperitoneum, within the limitations of a supine film.  No acute osseous abnormalities.  The soft tissues are within normal limits.  Surgical changes, devices, tubes and lines: Bilateral hip arthroplasty noted.  IMPRESSION: *Findings favoring improving partial small bowel  obstruction.  Electronically Signed   By: Ree Molt M.D.   On: 06/08/2024 10:38

## 2024-07-11 NOTE — Patient Instructions (Signed)
 [facility] [place] - A DEPT OF [facility]  Discharge Instructions: Thank you for choosing [facility] to provide your oncology and hematology care.  If you have a lab appointment with the Cancer Center - please note that after April 8th, 2024, all labs will be drawn in the cancer center.  You do not have to check in or register with the main entrance as you have in the past but will complete your check-in in the cancer center.  Wear comfortable clothing and clothing appropriate for easy access to any Portacath or PICC line.   We strive to give you quality time with your provider. You may need to reschedule your appointment if you arrive late (15 or more minutes).  Arriving late affects you and other patients whose appointments are after yours.  Also, if you miss three or more appointments without notifying the office, you may be dismissed from the clinic at the provider's discretion.      For prescription refill requests, have your pharmacy contact our office and allow 72 hours for refills to be completed.    Today you received the following chemotherapy and/or immunotherapy agents Carboplatin . Carboplatin  Injection What is this medication? CARBOPLATIN  (KAR boe pla tin) treats some types of cancer. It works by slowing down the growth of cancer cells. This medicine may be used for other purposes; ask your health care provider or pharmacist if you have questions. COMMON BRAND NAME(S): Paraplatin  What should I tell my care team before I take this medication? They need to know if you have any of these conditions: Blood disorders Hearing problems Kidney disease Recent or ongoing radiation therapy An unusual or allergic reaction to carboplatin , cisplatin, other medications, foods, dyes, or preservatives Pregnant or trying to get pregnant Breast-feeding How should I use this medication? This medication is injected into a vein. It is given by your care team  in a hospital or clinic setting. Talk to your care team about the use of this medication in children. Special care may be needed. Overdosage: If you think you have taken too much of this medicine contact a poison control center or emergency room at once. NOTE: This medicine is only for you. Do not share this medicine with others. What if I miss a dose? Keep appointments for follow-up doses. It is important not to miss your dose. Call your care team if you are unable to keep an appointment. What may interact with this medication? Medications for seizures Some antibiotics, such as amikacin, gentamicin, neomycin , streptomycin, tobramycin  Vaccines This list may not describe all possible interactions. Give your health care provider a list of all the medicines, herbs, non-prescription drugs, or dietary supplements you use. Also tell them if you smoke, drink alcohol , or use illegal drugs. Some items may interact with your medicine. What should I watch for while using this medication? Your condition will be monitored carefully while you are receiving this medication. You may need blood work while taking this medication. This medication may make you feel generally unwell. This is not uncommon, as chemotherapy can affect healthy cells as well as cancer cells. Report any side effects. Continue your course of treatment even though you feel ill unless your care team tells you to stop. In some cases, you may be given additional medications to help with side effects. Follow all directions for their use. This medication may increase your risk of getting an infection. Call your care team for advice if you  get a fever, chills, sore throat, or other symptoms of a cold or flu. Do not treat yourself. Try to avoid being around people who are sick. Avoid taking medications that contain aspirin, acetaminophen , ibuprofen, naproxen, or ketoprofen unless instructed by your care team. These medications may hide a fever. Be  careful brushing or flossing your teeth or using a toothpick because you may get an infection or bleed more easily. If you have any dental work done, tell your dentist you are receiving this medication. Talk to your care team if you wish to become pregnant or think you might be pregnant. This medication can cause serious birth defects. Talk to your care team about effective forms of contraception. Do not breast-feed while taking this medication. What side effects may I notice from receiving this medication? Side effects that you should report to your care team as soon as possible: Allergic reactions--skin rash, itching, hives, swelling of the face, lips, tongue, or throat Infection--fever, chills, cough, sore throat, wounds that don't heal, pain or trouble when passing urine, general feeling of discomfort or being unwell Low red blood cell level--unusual weakness or fatigue, dizziness, headache, trouble breathing Pain, tingling, or numbness in the hands or feet, muscle weakness, change in vision, confusion or trouble speaking, loss of balance or coordination, trouble walking, seizures Unusual bruising or bleeding Side effects that usually do not require medical attention (report to your care team if they continue or are bothersome): Hair loss Nausea Unusual weakness or fatigue Vomiting This list may not describe all possible side effects. Call your doctor for medical advice about side effects. You may report side effects to FDA at 1-800-FDA-1088. Where should I keep my medication? This medication is given in a hospital or clinic. It will not be stored at home. NOTE: This sheet is a summary. It may not cover all possible information. If you have questions about this medicine, talk to your doctor, pharmacist, or health care provider.  2024 Elsevier/Gold Standard (2022-01-20 00:00:00)      To help prevent nausea and vomiting after your treatment, we encourage you to take your nausea medication as  directed.  BELOW ARE SYMPTOMS THAT SHOULD BE REPORTED IMMEDIATELY: *FEVER GREATER THAN 100.4 F (38 C) OR HIGHER *CHILLS OR SWEATING *NAUSEA AND VOMITING THAT IS NOT CONTROLLED WITH YOUR NAUSEA MEDICATION *UNUSUAL SHORTNESS OF BREATH *UNUSUAL BRUISING OR BLEEDING *URINARY PROBLEMS (pain or burning when urinating, or frequent urination) *BOWEL PROBLEMS (unusual diarrhea, constipation, pain near the anus) TENDERNESS IN MOUTH AND THROAT WITH OR WITHOUT PRESENCE OF ULCERS (sore throat, sores in mouth, or a toothache) UNUSUAL RASH, SWELLING OR PAIN  UNUSUAL VAGINAL DISCHARGE OR ITCHING   Items with * indicate a potential emergency and should be followed up as soon as possible or go to the Emergency Department if any problems should occur.  Please show the CHEMOTHERAPY ALERT CARD or IMMUNOTHERAPY ALERT CARD at check-in to the Emergency Department and triage nurse.  Should you have questions after your visit or need to cancel or reschedule your appointment, please contact [facility] [place] - A DEPT OF [facility] [phone]  and follow the prompts.  Office hours are 8:00 a.m. to 4:30 p.m. Monday - Friday. Please note that voicemails left after 4:00 p.m. may not be returned until the following business day.  We are closed weekends and major holidays. You have access to a nurse at all times for urgent questions. Please call the main number to the clinic [phone]  and follow the prompts.  For any non-urgent questions, you may also contact your provider using MyChart. We now offer e-Visits for anyone 68 and older to request care online for non-urgent symptoms. For details visit [email or web address].   Also download the MyChart app! Go to the app store, search MyChart, open the app, select [place], and log in with your MyChart username and password.

## 2024-07-11 NOTE — Assessment & Plan Note (Signed)
 Patient has a history of polyarthralgia likely secondary to previous treatments Was on prednisone  5 mg daily. Does not report any polyarthralgias at this time.  Can discontinue prednisone .

## 2024-07-11 NOTE — Assessment & Plan Note (Signed)
 Patient has cancer-related fatigue and is on Ritalin  5mg  as needed  - Continue Ritalin  5 mg as needed.

## 2024-07-11 NOTE — Assessment & Plan Note (Addendum)
 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   -Patient tolerating treatment well.  Has no chemotherapy induced side effects at this time - Labs reviewed today.  CMP: WNL, CBC: Stable leukocytosis, hemoglobin: 12.5, platelets: 267 - Physical exam stable today.  Proceed with treatment today. - Repeat CT scan in 3 months.  Will update after completing 4 cycles of chemotherapy.  Due after next cycle of chemotherapy   Return to clinic in 3 weeks with labs prior to next cycle of chemotherapy

## 2024-07-11 NOTE — Assessment & Plan Note (Signed)
 Patient reports blurry vision is a complication of previous cetuximab  infusion  - Continue to follow with ophthalmology

## 2024-07-11 NOTE — Assessment & Plan Note (Signed)
 Patient has history of CLL diagnosed in 2023.  RAI stage I No B symptoms  - Does not meet criteria for treatment at this time - Continue to monitor

## 2024-07-11 NOTE — Assessment & Plan Note (Addendum)
 Patient has cetuximab  induced rash. On doxycycline  and clindamycin  gel Cetuximab  discontinued at this time.  Rash significantly improved   - No need of doxycycline  and clindamycin  gel at this time - Continue using moisturizing cream for skin dryness

## 2024-07-11 NOTE — Progress Notes (Signed)
Treatment given today per MD orders. Tolerated infusion without adverse affects. Vital signs stable. No complaints at this time. Discharged from clinic via wheel chair in stable condition. Alert and oriented x 3. F/U with [facility] as scheduled.  

## 2024-07-12 ENCOUNTER — Inpatient Hospital Stay: Attending: Hematology

## 2024-07-12 VITALS — BP 112/58 | HR 91 | Temp 96.9°F | Resp 18

## 2024-07-12 DIAGNOSIS — C4442 Squamous cell carcinoma of skin of scalp and neck: Secondary | ICD-10-CM

## 2024-07-12 DIAGNOSIS — C778 Secondary and unspecified malignant neoplasm of lymph nodes of multiple regions: Secondary | ICD-10-CM | POA: Insufficient documentation

## 2024-07-12 DIAGNOSIS — Z95828 Presence of other vascular implants and grafts: Secondary | ICD-10-CM

## 2024-07-12 DIAGNOSIS — D539 Nutritional anemia, unspecified: Secondary | ICD-10-CM | POA: Diagnosis not present

## 2024-07-12 DIAGNOSIS — C801 Malignant (primary) neoplasm, unspecified: Secondary | ICD-10-CM | POA: Insufficient documentation

## 2024-07-12 DIAGNOSIS — Z5189 Encounter for other specified aftercare: Secondary | ICD-10-CM | POA: Insufficient documentation

## 2024-07-12 DIAGNOSIS — Z5111 Encounter for antineoplastic chemotherapy: Secondary | ICD-10-CM | POA: Diagnosis present

## 2024-07-12 DIAGNOSIS — C9110 Chronic lymphocytic leukemia of B-cell type not having achieved remission: Secondary | ICD-10-CM | POA: Insufficient documentation

## 2024-07-12 DIAGNOSIS — R530 Neoplastic (malignant) related fatigue: Secondary | ICD-10-CM | POA: Insufficient documentation

## 2024-07-12 DIAGNOSIS — R21 Rash and other nonspecific skin eruption: Secondary | ICD-10-CM | POA: Insufficient documentation

## 2024-07-12 MED ORDER — PEGFILGRASTIM-CBQV 6 MG/0.6ML ~~LOC~~ SOSY
6.0000 mg | PREFILLED_SYRINGE | Freq: Once | SUBCUTANEOUS | Status: AC
  Administered 2024-07-12: 6 mg via SUBCUTANEOUS
  Filled 2024-07-12: qty 0.6

## 2024-07-12 NOTE — Progress Notes (Signed)
 Randon Somera Clouatre presents today for injection per the provider's orders.  Udenyca  administration without incident; injection site WNL; see MAR for injection details.  Patient tolerated procedure well and without incident.  No questions or complaints noted at this time.   Treatment given today per MD orders. Tolerated infusion without adverse affects. Vital signs stable. No complaints at this time. Discharged from clinic ambulatory in stable condition. Alert and oriented x 3. F/U with [facility] as scheduled.

## 2024-07-12 NOTE — Patient Instructions (Signed)
 [facility] [place] - A DEPT OF [facility]  Discharge Instructions: Thank you for choosing  [facility] to provide your oncology and hematology care.  If you have a lab appointment with the Cancer Center - please note that after April 8th, 2024, all labs will be drawn in the cancer center.  You do not have to check in or register with the main entrance as you have in the past but will complete your check-in in the cancer center.  Wear comfortable clothing and clothing appropriate for easy access to any Portacath or PICC line.   We strive to give you quality time with your provider. You may need to reschedule your appointment if you arrive late (15 or more minutes).  Arriving late affects you and other patients whose appointments are after yours.  Also, if you miss three or more appointments without notifying the office, you may be dismissed from the clinic at the provider's discretion.      For prescription refill requests, have your pharmacy contact our office and allow 72 hours for refills to be completed.    Today you received Udenyca  injection.   BELOW ARE SYMPTOMS THAT SHOULD BE REPORTED IMMEDIATELY: *FEVER GREATER THAN 100.4 F (38 C) OR HIGHER *CHILLS OR SWEATING *NAUSEA AND VOMITING THAT IS NOT CONTROLLED WITH YOUR NAUSEA MEDICATION *UNUSUAL SHORTNESS OF BREATH *UNUSUAL BRUISING OR BLEEDING *URINARY PROBLEMS (pain or burning when urinating, or frequent urination) *BOWEL PROBLEMS (unusual diarrhea, constipation, pain near the anus) TENDERNESS IN MOUTH AND THROAT WITH OR WITHOUT PRESENCE OF ULCERS (sore throat, sores in mouth, or a toothache) UNUSUAL RASH, SWELLING OR PAIN  UNUSUAL VAGINAL DISCHARGE OR ITCHING   Items with * indicate a potential emergency and should be followed up as soon as possible or go to the Emergency Department if any problems should occur.  Please show the CHEMOTHERAPY ALERT CARD or IMMUNOTHERAPY ALERT CARD at check-in to  the Emergency Department and triage nurse.  Should you have questions after your visit or need to cancel or reschedule your appointment, please contact [facility] [place] - A DEPT OF [facility] [phone]  and follow the prompts.  Office hours are 8:00 a.m. to 4:30 p.m. Monday - Friday. Please note that voicemails left after 4:00 p.m. may not be returned until the following business day.  We are closed weekends and major holidays. You have access to a nurse at all times for urgent questions. Please call the main number to the clinic [phone] and follow the prompts.  For any non-urgent questions, you may also contact your provider using MyChart. We now offer e-Visits for anyone 28 and older to request care online for non-urgent symptoms. For details visit [email or web address].   Also download the MyChart app! Go to the app store, search "MyChart", open the app, select , and log in with your MyChart username and password.

## 2024-07-17 ENCOUNTER — Ambulatory Visit: Admitting: Family Medicine

## 2024-07-17 ENCOUNTER — Encounter: Payer: Self-pay | Admitting: Family Medicine

## 2024-07-17 VITALS — BP 104/56 | HR 101 | Temp 97.8°F | Ht 66.0 in | Wt 161.0 lb

## 2024-07-17 DIAGNOSIS — R530 Neoplastic (malignant) related fatigue: Secondary | ICD-10-CM

## 2024-07-17 DIAGNOSIS — K56609 Unspecified intestinal obstruction, unspecified as to partial versus complete obstruction: Secondary | ICD-10-CM | POA: Diagnosis not present

## 2024-07-17 DIAGNOSIS — Z23 Encounter for immunization: Secondary | ICD-10-CM

## 2024-07-17 NOTE — Progress Notes (Signed)
 BP (!) 104/56   Pulse (!) 101   Temp 97.8 F (36.6 C)   Ht 5' 6 (1.676 m)   Wt 161 lb (73 kg)   SpO2 96%   BMI 25.99 kg/m    Subjective:   Patient ID: [name], male    DOB: [birth year], 89 y.o.   MRN: [id number]  HPI: MONTREZ [name] is a 89 y.o. male presenting on 07/17/2024 for Hospitalization Follow-up (Bowel obstruction)   Discussed the use of AI scribe software for clinical note transcription with the patient, who gave verbal consent to proceed.  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  Small bowel obstruction - Hospitalized from June 01, 2024 to June 10, 2024 for small bowel obstruction - Obstruction managed conservatively - Kept without oral intake for 10-11 days - Experienced 10-pound weight loss during hospitalization - Since discharge, resumed eating without nausea or vomiting - Having daily bowel movements - No current abdominal bloating or pain  Generalized weakness and fatigue - Significant weakness following recent chemotherapy and subsequent injection - Progressive worsening of weakness since chemotherapy - Feels weaker than prior to hospital admission for small bowel obstruction - Caregiver observes greater weakness compared to immediately after hospital discharge - Lack of energy attributed to recent chemotherapy and hospitalization - Caregiver concerned about declining energy levels and impact of chemotherapy on overall health  Neck mass and associated symptoms - New lump in neck causing soreness and discomfort, especially with head rotation - Intermittent sensation of choking - Caregiver concerned about the neck lump  Chemotherapy and medication side effects - History of squamous cell carcinoma of the head and neck - Recently resumed chemotherapy - Previous treatment caused rash  and eyelash abnormalities, leading to change in medication regimen - Switched back to chemotherapy after side effects from prior medication  Atrial fibrillation and anticoagulation management - History of atrial fibrillation - Warfarin discontinued during recent hospitalization due to heart complications - Cardiology consulted during hospital stay; decision made to withhold warfarin temporarily          Relevant past medical, surgical, family and social history reviewed and updated as indicated. Interim medical history since our last visit reviewed. Allergies and medications reviewed and updated.  Review of Systems  Constitutional:  Negative for chills and fever.  Respiratory:  Negative for shortness of breath and wheezing.   Cardiovascular:  Negative for chest pain and leg swelling.  Gastrointestinal:  Negative for abdominal distention, abdominal pain, blood in stool, constipation, diarrhea and nausea.  Musculoskeletal:  Negative for back pain and gait problem.  Skin:  Negative for rash.  All other systems reviewed and are negative.   Per HPI unless specifically indicated above   Allergies as of 07/17/2024       Reactions   Penicillins Hives, Rash   Has patient had a PCN reaction causing immediate rash, facial/tongue/throat swelling, SOB or lightheadedness with hypotension: Yes Has patient had a PCN reaction causing severe rash involving mucus membranes or skin necrosis: Yes Has patient had a PCN reaction that required hospitalization: No Has patient had a PCN reaction occurring within the last 10 years: No If all of the above answers are NO, then may proceed with Cephalosporin use.   Cetuximab  Cough   Cough and scratchy throat. Drug rechallenged and pt able to  tolerated the rest of the infusion without any complications.    Hct [hydrochlorothiazide] Other (See Comments)   hyper   Statins Other (See Comments)   Myalgia, tolerates low dosages of lovastatin           Medication List        Accurate as of July 17, 2024  4:16 PM. If you have any questions, ask your nurse or doctor.          acetaminophen  500 MG tablet Commonly known as: TYLENOL  Take 1,000 mg by mouth every 6 (six) hours as needed for moderate pain.   apixaban  5 MG Tabs tablet Commonly known as: ELIQUIS  Take 1 tablet (5 mg total) by mouth 2 (two) times daily.   clindamycin  1 % gel Commonly known as: CLINDAGEL Apply 1 Application topically daily at 6 (six) AM.   digoxin  0.125 MG tablet Commonly known as: LANOXIN  Take 1 tablet (0.125 mg total) by mouth daily.   ezetimibe  10 MG tablet Commonly known as: ZETIA  TAKE ONE (1) TABLET BY MOUTH EVERY DAY   finasteride  5 MG tablet Commonly known as: PROSCAR  TAKE ONE (1) TABLET EACH DAY   fluocinonide  cream 0.05 % Commonly known as: LIDEX  Apply 1 Application topically 2 (two) times daily.   fluticasone  50 MCG/ACT nasal spray Commonly known as: FLONASE  Place 1 spray into both nostrils daily as needed for allergies or rhinitis.   furosemide  20 MG tablet Commonly known as: LASIX  Take 1 tablet (20 mg total) by mouth daily as needed (shortness of breathe).   lovastatin  40 MG tablet Commonly known as: MEVACOR  Take 1 tablet (40 mg total) by mouth at bedtime.   magnesium  oxide 400 (240 Mg) MG tablet Commonly known as: MAG-OX Take 1 tablet (400 mg total) by mouth 2 (two) times daily.   methylphenidate  5 MG tablet Commonly known as: RITALIN  TAKE 1 TABLET BY MOUTH DAILY AS NEEDED   metoprolol  succinate 100 MG 24 hr tablet Commonly known as: TOPROL -XL Take 1 tablet (100 mg total) by mouth daily. Take with or immediately following a meal.   montelukast  10 MG tablet Commonly known as: SINGULAIR  Take 1 tablet (10 mg total) by mouth at bedtime.   prochlorperazine  10 MG tablet Commonly known as: COMPAZINE  Take 1 tablet (10 mg total) by mouth every 6 (six) hours as needed for nausea or vomiting.   tobramycin  0.3 %  ophthalmic solution Commonly known as: TOBREX  Place 1 drop into both eyes in the morning, at noon, in the evening, and at bedtime.   trimethoprim-polymyxin b  ophthalmic solution Commonly known as: POLYTRIM SMARTSIG:In Eye(s)   Vitamin D3 125 MCG (5000 UT) Caps Take 5,000 Units by mouth once a week.         Objective:   BP (!) 104/56   Pulse (!) 101   Temp 97.8 F (36.6 C)   Ht 5' 6 (1.676 m)   Wt 161 lb (73 kg)   SpO2 96%   BMI 25.99 kg/m   Wt Readings from Last 3 Encounters:  07/17/24 161 lb (73 kg)  07/11/24 163 lb 5.8 oz (74.1 kg)  07/05/24 163 lb (73.9 kg)    Physical Exam Vitals and nursing note reviewed.  Constitutional:      General: He is not in acute distress.    Appearance: He is well-developed. He is not diaphoretic.  Neck:     Thyroid : No thyromegaly.  Cardiovascular:     Rate and Rhythm: Normal rate. Rhythm irregular.     Heart  sounds: Normal heart sounds. No murmur heard. Pulmonary:     Effort: Pulmonary effort is normal. No respiratory distress.     Breath sounds: Normal breath sounds. No wheezing.  Abdominal:     General: Abdomen is flat. Bowel sounds are normal. There is no distension.     Palpations: Abdomen is soft.     Tenderness: There is no abdominal tenderness. There is no guarding or rebound.     Hernia: No hernia is present.  Musculoskeletal:        General: No swelling. Normal range of motion.     Cervical back: Neck supple.  Lymphadenopathy:     Cervical: No cervical adenopathy.  Skin:    General: Skin is warm and dry.     Findings: No rash.  Neurological:     Mental Status: He is alert and oriented to person, place, and time.     Coordination: Coordination normal.  Psychiatric:        Behavior: Behavior normal.                 Assessment & Plan:   Problem List Items Addressed This Visit       Digestive   SBO (small bowel obstruction) (HCC) - Primary   Relevant Orders   CBC with Differential/Platelet    CMP14+EGFR   TSH     Other   Fatigue   Relevant Orders   CBC with Differential/Platelet   CMP14+EGFR   TSH        Metastatic Squamous Cell Carcinoma of Head and Neck Ongoing chemotherapy with increased weakness and fatigue likely due to treatment and poor nutrition. New neck lump noted, surgical intervention not recommended due to risks. - Apply ice to neck lump regularly to reduce swelling and pain. - Continue chemotherapy as scheduled. - Plan for imaging in two weeks to assess chemotherapy effectiveness.  Chemotherapy-induced Anemia (suspected) Suspected anemia due to chemotherapy, contributing to increased weakness and fatigue. - Order blood work to check for anemia and assess blood counts.  Atrial Fibrillation Recent discontinuation of warfarin due to heart concerns during hospitalization. - Follow cardiologist's guidance regarding resumption of warfarin.          Follow up plan: Return in about 3 months (around 10/17/2024), or if symptoms worsen or fail to improve, for Recheck A-fib and fatigue.  Counseling provided for all of the vaccine components Orders Placed This Encounter  Procedures   CBC with Differential/Platelet   CMP14+EGFR   TSH    Fonda Levins, MD [facility] 07/17/2024, 4:16 PM

## 2024-07-18 ENCOUNTER — Encounter: Payer: Self-pay | Admitting: Oncology

## 2024-07-18 LAB — CBC WITH DIFFERENTIAL/PLATELET
Basophils Absolute: 0 x10E3/uL (ref 0.0–0.2)
Basos: 0 %
EOS (ABSOLUTE): 0.2 x10E3/uL (ref 0.0–0.4)
Eos: 1 %
Hematocrit: 36.7 % — ABNORMAL LOW (ref 37.5–51.0)
Hemoglobin: 12.1 g/dL — ABNORMAL LOW (ref 13.0–17.7)
Immature Grans (Abs): 0.4 x10E3/uL — ABNORMAL HIGH (ref 0.0–0.1)
Immature Granulocytes: 2 %
Lymphocytes Absolute: 4.4 x10E3/uL — ABNORMAL HIGH (ref 0.7–3.1)
Lymphs: 17 %
MCH: 34.7 pg — ABNORMAL HIGH (ref 26.6–33.0)
MCHC: 33 g/dL (ref 31.5–35.7)
MCV: 105 fL — ABNORMAL HIGH (ref 79–97)
Monocytes Absolute: 1.3 x10E3/uL — ABNORMAL HIGH (ref 0.1–0.9)
Monocytes: 5 %
Neutrophils Absolute: 19.6 x10E3/uL — ABNORMAL HIGH (ref 1.4–7.0)
Neutrophils: 75 %
Platelets: 143 x10E3/uL — ABNORMAL LOW (ref 150–450)
RBC: 3.49 x10E6/uL — ABNORMAL LOW (ref 4.14–5.80)
RDW: 12.9 % (ref 11.6–15.4)
WBC: 25.9 x10E3/uL (ref 3.4–10.8)

## 2024-07-18 LAB — CMP14+EGFR
ALT: 29 IU/L (ref 0–44)
AST: 35 IU/L (ref 0–40)
Albumin: 3.7 g/dL (ref 3.7–4.7)
Alkaline Phosphatase: 163 IU/L — ABNORMAL HIGH (ref 48–129)
BUN/Creatinine Ratio: 16 (ref 10–24)
BUN: 26 mg/dL (ref 8–27)
Bilirubin Total: 0.5 mg/dL (ref 0.0–1.2)
CO2: 28 mmol/L (ref 20–29)
Calcium: 9.1 mg/dL (ref 8.6–10.2)
Chloride: 99 mmol/L (ref 96–106)
Creatinine, Ser: 1.6 mg/dL — ABNORMAL HIGH (ref 0.76–1.27)
Globulin, Total: 2.1 g/dL (ref 1.5–4.5)
Glucose: 113 mg/dL — ABNORMAL HIGH (ref 70–99)
Potassium: 4 mmol/L (ref 3.5–5.2)
Sodium: 140 mmol/L (ref 134–144)
Total Protein: 5.8 g/dL — ABNORMAL LOW (ref 6.0–8.5)
eGFR: 41 mL/min/1.73 — ABNORMAL LOW (ref >59)

## 2024-07-18 LAB — TSH: TSH: 1.51 u[IU]/mL (ref 0.450–4.500)

## 2024-07-19 ENCOUNTER — Ambulatory Visit: Payer: Self-pay | Admitting: Family Medicine

## 2024-07-19 ENCOUNTER — Other Ambulatory Visit: Payer: Self-pay

## 2024-07-19 DIAGNOSIS — D72829 Elevated white blood cell count, unspecified: Secondary | ICD-10-CM

## 2024-07-19 DIAGNOSIS — N289 Disorder of kidney and ureter, unspecified: Secondary | ICD-10-CM

## 2024-07-21 ENCOUNTER — Other Ambulatory Visit

## 2024-07-21 DIAGNOSIS — D72829 Elevated white blood cell count, unspecified: Secondary | ICD-10-CM | POA: Diagnosis not present

## 2024-07-21 DIAGNOSIS — N289 Disorder of kidney and ureter, unspecified: Secondary | ICD-10-CM | POA: Diagnosis not present

## 2024-07-22 ENCOUNTER — Encounter: Payer: Self-pay | Admitting: Oncology

## 2024-07-22 LAB — CBC WITH DIFFERENTIAL/PLATELET
Basophils Absolute: 0.1 x10E3/uL (ref 0.0–0.2)
Basos: 0 %
EOS (ABSOLUTE): 0.1 x10E3/uL (ref 0.0–0.4)
Eos: 1 %
Hematocrit: 34.4 % — ABNORMAL LOW (ref 37.5–51.0)
Hemoglobin: 11.3 g/dL — ABNORMAL LOW (ref 13.0–17.7)
Immature Grans (Abs): 0.2 x10E3/uL — ABNORMAL HIGH (ref 0.0–0.1)
Immature Granulocytes: 1 %
Lymphocytes Absolute: 3.3 x10E3/uL — ABNORMAL HIGH (ref 0.7–3.1)
Lymphs: 23 %
MCH: 34.8 pg — ABNORMAL HIGH (ref 26.6–33.0)
MCHC: 32.8 g/dL (ref 31.5–35.7)
MCV: 106 fL — ABNORMAL HIGH (ref 79–97)
Monocytes Absolute: 0.8 x10E3/uL (ref 0.1–0.9)
Monocytes: 5 %
Neutrophils Absolute: 10.1 x10E3/uL — ABNORMAL HIGH (ref 1.4–7.0)
Neutrophils: 70 %
Platelets: 116 x10E3/uL — ABNORMAL LOW (ref 150–450)
RBC: 3.25 x10E6/uL — ABNORMAL LOW (ref 4.14–5.80)
RDW: 12.4 % (ref 11.6–15.4)
WBC: 14.6 x10E3/uL — ABNORMAL HIGH (ref 3.4–10.8)

## 2024-07-22 LAB — BASIC METABOLIC PANEL WITH GFR
BUN/Creatinine Ratio: 19 (ref 10–24)
BUN: 22 mg/dL (ref 8–27)
CO2: 26 mmol/L (ref 20–29)
Calcium: 9 mg/dL (ref 8.6–10.2)
Chloride: 103 mmol/L (ref 96–106)
Creatinine, Ser: 1.14 mg/dL (ref 0.76–1.27)
Glucose: 136 mg/dL — ABNORMAL HIGH (ref 70–99)
Potassium: 4.5 mmol/L (ref 3.5–5.2)
Sodium: 144 mmol/L (ref 134–144)
eGFR: 61 mL/min/1.73 (ref >59)

## 2024-07-24 ENCOUNTER — Telehealth: Payer: Self-pay | Admitting: Family Medicine

## 2024-07-24 NOTE — Telephone Encounter (Signed)
 [name]  verbalized understanding. Patients family wants labs reviewed and a call back

## 2024-07-24 NOTE — Telephone Encounter (Unsigned)
 Copied from CRM #[id number]. Topic: General - Other >> Jul 24, 2024  3:05 PM Kevelyn M wrote: Reason for CRM: Leah with [facility] calling to see if there was any lab work that would prevent her from doing physical therapy with the patient. She is requesting a call back after you spoken to the patient.  Call back # [phone] Marinda)

## 2024-07-24 NOTE — Telephone Encounter (Signed)
 Is there anything that would prevent him to get PT?

## 2024-07-24 NOTE — Telephone Encounter (Signed)
 Know his lab work looked pretty decent I am and his white count was mildly elevated but that been normal for him since the cancer and he was mildly anemic but that is also been normal for him since the cancer, the rest of his blood work looked good so I think they are okay to go ahead and do physical therapy.

## 2024-07-26 ENCOUNTER — Ambulatory Visit: Payer: Self-pay | Admitting: Family Medicine

## 2024-07-28 ENCOUNTER — Telehealth: Payer: Self-pay

## 2024-07-28 ENCOUNTER — Other Ambulatory Visit: Payer: Self-pay | Admitting: Cardiology

## 2024-07-28 NOTE — Telephone Encounter (Signed)
 NotedSABRA Barefoot with [facility] made aware to continue with PT and call back with frequency.

## 2024-07-28 NOTE — Telephone Encounter (Signed)
 Copied from CRM [id number]. Topic: Clinical - Home Health Verbal Orders >> Jul 28, 2024 11:13 AM Sophia H wrote: Caller/Agency: [facility] Callback Number: [phone] (vmail secured) Service Requested: Physical Therapy Frequency: Re certification instead of discharge the week of Oct 27th, will call back with frequency after.  Any new concerns about the patient? No

## 2024-08-02 ENCOUNTER — Inpatient Hospital Stay

## 2024-08-02 ENCOUNTER — Inpatient Hospital Stay: Admitting: Oncology

## 2024-08-02 VITALS — BP 128/74 | HR 81 | Temp 97.9°F | Resp 18

## 2024-08-02 VITALS — Wt 165.2 lb

## 2024-08-02 DIAGNOSIS — D649 Anemia, unspecified: Secondary | ICD-10-CM | POA: Diagnosis not present

## 2024-08-02 DIAGNOSIS — C9110 Chronic lymphocytic leukemia of B-cell type not having achieved remission: Secondary | ICD-10-CM | POA: Diagnosis not present

## 2024-08-02 DIAGNOSIS — C4442 Squamous cell carcinoma of skin of scalp and neck: Secondary | ICD-10-CM

## 2024-08-02 DIAGNOSIS — R21 Rash and other nonspecific skin eruption: Secondary | ICD-10-CM

## 2024-08-02 DIAGNOSIS — Z5111 Encounter for antineoplastic chemotherapy: Secondary | ICD-10-CM | POA: Diagnosis not present

## 2024-08-02 DIAGNOSIS — Z95828 Presence of other vascular implants and grafts: Secondary | ICD-10-CM

## 2024-08-02 LAB — CBC WITH DIFFERENTIAL/PLATELET
Abs Immature Granulocytes: 0.24 K/uL — ABNORMAL HIGH (ref 0.00–0.07)
Basophils Absolute: 0.1 K/uL (ref 0.0–0.1)
Basophils Relative: 1 %
Eosinophils Absolute: 0 K/uL (ref 0.0–0.5)
Eosinophils Relative: 0 %
HCT: 33.4 % — ABNORMAL LOW (ref 39.0–52.0)
Hemoglobin: 10.9 g/dL — ABNORMAL LOW (ref 13.0–17.0)
Immature Granulocytes: 2 %
Lymphocytes Relative: 38 %
Lymphs Abs: 4.4 K/uL — ABNORMAL HIGH (ref 0.7–4.0)
MCH: 35.2 pg — ABNORMAL HIGH (ref 26.0–34.0)
MCHC: 32.6 g/dL (ref 30.0–36.0)
MCV: 107.7 fL — ABNORMAL HIGH (ref 80.0–100.0)
Monocytes Absolute: 1 K/uL (ref 0.1–1.0)
Monocytes Relative: 9 %
Neutro Abs: 6 K/uL (ref 1.7–7.7)
Neutrophils Relative %: 50 %
Platelets: 473 K/uL — ABNORMAL HIGH (ref 150–400)
RBC: 3.1 MIL/uL — ABNORMAL LOW (ref 4.22–5.81)
RDW: 14.7 % (ref 11.5–15.5)
WBC: 11.8 K/uL — ABNORMAL HIGH (ref 4.0–10.5)
nRBC: 0 % (ref 0.0–0.2)

## 2024-08-02 LAB — COMPREHENSIVE METABOLIC PANEL WITH GFR
ALT: 12 U/L (ref 0–44)
AST: 23 U/L (ref 15–41)
Albumin: 3.7 g/dL (ref 3.5–5.0)
Alkaline Phosphatase: 94 U/L (ref 38–126)
Anion gap: 11 (ref 5–15)
BUN: 17 mg/dL (ref 8–23)
CO2: 27 mmol/L (ref 22–32)
Calcium: 8.8 mg/dL — ABNORMAL LOW (ref 8.9–10.3)
Chloride: 104 mmol/L (ref 98–111)
Creatinine, Ser: 1.04 mg/dL (ref 0.61–1.24)
GFR, Estimated: >60 mL/min (ref >60)
Glucose, Bld: 102 mg/dL — ABNORMAL HIGH (ref 70–99)
Potassium: 4 mmol/L (ref 3.5–5.1)
Sodium: 141 mmol/L (ref 135–145)
Total Bilirubin: 0.3 mg/dL (ref 0.0–1.2)
Total Protein: 7.4 g/dL (ref 6.5–8.1)

## 2024-08-02 LAB — MAGNESIUM: Magnesium: 2.1 mg/dL (ref 1.7–2.4)

## 2024-08-02 MED ORDER — SODIUM CHLORIDE 0.9 % IV SOLN
400.0000 mg | Freq: Once | INTRAVENOUS | Status: AC
  Administered 2024-08-02: 400 mg via INTRAVENOUS
  Filled 2024-08-02: qty 40

## 2024-08-02 MED ORDER — PALONOSETRON HCL INJECTION 0.25 MG/5ML
0.2500 mg | Freq: Once | INTRAVENOUS | Status: AC
  Administered 2024-08-02: 0.25 mg via INTRAVENOUS
  Filled 2024-08-02: qty 5

## 2024-08-02 MED ORDER — SODIUM CHLORIDE 0.9 % IV SOLN: INTRAVENOUS | Status: DC

## 2024-08-02 MED ORDER — FAMOTIDINE IN NACL 20-0.9 MG/50ML-% IV SOLN
20.0000 mg | Freq: Once | INTRAVENOUS | Status: AC
  Administered 2024-08-02: 20 mg via INTRAVENOUS
  Filled 2024-08-02: qty 50

## 2024-08-02 MED ORDER — CETIRIZINE HCL 10 MG/ML IV SOLN
10.0000 mg | Freq: Once | INTRAVENOUS | Status: AC
  Administered 2024-08-02: 10 mg via INTRAVENOUS
  Filled 2024-08-02: qty 1

## 2024-08-02 MED ORDER — SODIUM CHLORIDE 0.9 % IV SOLN
150.0000 mg | Freq: Once | INTRAVENOUS | Status: AC
  Administered 2024-08-02: 150 mg via INTRAVENOUS
  Filled 2024-08-02: qty 150

## 2024-08-02 MED ORDER — DEXAMETHASONE SOD PHOSPHATE PF 10 MG/ML IJ SOLN
10.0000 mg | Freq: Once | INTRAMUSCULAR | Status: AC
  Administered 2024-08-02: 10 mg via INTRAVENOUS

## 2024-08-02 NOTE — Patient Instructions (Signed)

## 2024-08-02 NOTE — Progress Notes (Signed)
 Patient presents today for chemotherapy Carboplatin   infusion. Patient is in satisfactory condition with no new complaints voiced.  Vital signs are stable.  Labs reviewed by Dr. Davonna during the office visit and all labs are within treatment parameters.  We will proceed with treatment per MD orders.

## 2024-08-02 NOTE — Progress Notes (Signed)
 Patient Care Team: [name], Fonda LABOR, MD as PCP - General (Family Medicine) [name], MD as PCP - Cardiology (Cardiology) Watt Rush, MD as Attending Physician (Urology) [name], MD as Consulting Physician (Cardiology) Golda Claudis PENNER, MD (Inactive) as Consulting Physician (Gastroenterology) Perley Hamilton, MD as Consulting Physician (Ophthalmology) Heide Ingle, MD as Consulting Physician (Orthopedic Surgery) Frances Ozell RAMAN, LCSW as [facility] Care Management (Licensed Clinical Social Worker) Celestia Joesph SQUIBB, RN as Oncology Nurse Navigator (Medical Oncology)  Clinic Day:  08/02/2024  Referring physician: [name], Fonda LABOR, MD   CHIEF COMPLAINT:  CC:  Metastatic squamous cell carcinoma, 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   Squamous cell carcinoma of head and neck  Patient with metastatic carcinoma of unknown primary likely head and neck origin Patient had metastatic disease in the supraclavicular region with bilateral pulmonary nodules and right axillary lymphadenopathy 1st line: Carboplatin  plus paclitaxel  plus pembrolizumab  (PD-L1 CPS: 25) with progression in right axillary and subcarinal lymphadenopathy Second line: Cetuximab , with progression in right axillary lymphadenopathy Currently on third line: Single agent carboplatin  every 3 weeks Recent CT scan with progression of disease and since then had a change in treatment to single agent carboplatin .  -Patient tolerating treatment well.  Has no chemotherapy induced side effects at this time - Labs reviewed today.  CMP: Normal creatinine, normal LFTs, CBC: CBC: 11.8, hemoglobin: 10.9, platelets: 473 - Physical exam stable today.  Proceed with treatment today. - Repeat CT scan in 3 months.  Will obtain prior to next cycle of chemotherapy -Continue  carboplatin  every 3 weeks  Return to clinic in 3 weeks with labs prior to next cycle of chemotherapy  CLL (chronic lymphocytic leukemia) (HCC) Patient has history of CLL diagnosed in 2023.  RAI stage I No B symptoms - Does not meet criteria for treatment at this time - Continue to monitor  Neoplastic malignant related fatigue Patient has cancer-related fatigue and is on Ritalin  5mg  as needed - Continue Ritalin  5 mg as needed.  Rash Patient has cetuximab  induced rash. On doxycycline  and clindamycin  gel Cetuximab  discontinued at this time.  Rash significantly improved  - No need of doxycycline  and clindamycin  gel at this time - Continue using moisturizing cream for skin dryness  Macrocytic anemia Likely secondary to myelosuppression from chemotherapy or CLL Currently asymptomatic  - Will obtain anemia panel with next blood draw.   The patient understands the plans discussed today and is in agreement with them.  He knows to contact our office if he develops concerns prior to his next appointment.  30 minutes of total time was spent for this patient encounter, including preparation, face-to-face counseling with the patient and coordination of care, physical exam, and documentation of the encounter. > 50% of the time was spent on counseling as documented under my assessment and plan.   I, Marijo Sharps, acting as a Neurosurgeon for [employer], MD.,have documented all relevant documentation on the behalf of [name], MD,as directed by  [name], MD while in the presence of [name], MD.  I, [name] MD, have reviewed the above documentation for accuracy and completeness, and I agree with the above.      [name], MD  [facility] [place] - A DEPT OF [place] [address] [place] [address] Dept: [phone] Dept Fax: [phone]   Orders Placed This Encounter  Procedures   CT  CHEST ABDOMEN PELVIS W CONTRAST    Standing Status:   Future    Expected Date:   08/16/2024    Expiration Date:   08/02/2025    If indicated for the ordered procedure, I authorize the administration of contrast media per Radiology protocol:   Yes    Does the patient have a contrast media/X-ray dye allergy?:   No    Preferred imaging location?:   [facility]    If indicated for the ordered procedure, I authorize the administration of oral contrast media per Radiology protocol:   Yes   Ferritin    Standing Status:   Future    Expected Date:   08/22/2024    Expiration Date:   11/20/2024   Folate    Standing Status:   Future    Expected Date:   08/22/2024    Expiration Date:   11/20/2024   Vitamin B12    Standing Status:   Future    Expected Date:   08/22/2024    Expiration Date:   11/20/2024   Iron and TIBC    Standing Status:   Future    Expected Date:   08/22/2024    Expiration Date:   11/20/2024     ONCOLOGY HISTORY:   I have reviewed his chart and materials related to his cancer extensively and collaborated history with the patient. 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 Solitary pathologic lymph node in the left neck also highly suspicious for metastatic disease. Multiple lung nodule in the a lung apices bilaterally compatible with metastatic disease. -05/28/2022: CT CAP: Large nodal mass in the right supraclavicular region with bilateral pulmonary nodules. In addition, slightly enlarged lymph nodes in the right axilla and right sub pectoralis region. Primary neoplastic source may be located in the right supraclavicular region but etiology is uncertain based on imaging. No evidence for metastatic disease in the abdomen or pelvis. Liver has a slightly nodular contour.  -06/08/2022:  Brain MRI: No evidence of mass lesion.  -06/09/2022: Right chest supraclavicular lymph node excision  Pathology: Metastatic poorly differentiated carcinoma.T e metastatic carcinoma is positive with p40, p63,  cytokeratin 5/6 and shows patchy positivity with cytokeratin 7.  GATA3  shows focal, weak likely nonspecific staining. The immunophenotype is most consistent with metastatic squamous cell carcinoma.  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 Met amplification, RET rearrangement, PTEN deletion: Not detected [employer] [facility] [employer]: NGS: TP53 pathogenic variant, MSI-stable, TMB-low, LOH-low, PD-L1 (SP142): Negative, p16 and p18 negative.  HER2 by IHC 0.pan-TRK: Not expressed PD-L1 22C3: CPS score 25 Cancer type ID: 96% probability squamous cell carcinoma, subtype head and neck/skin.  Cancer types ruled out with 95% confidence includes skin basal cell carcinoma.  -06/11/2022: Initial PET: Bulky RIGHT supraclavicular nodal mass with hypermetabolic activity. Findings consistent with metastatic adenopathy. Smaller hypermetabolic RIGHT posterior triangle and jugular lymph nodes. Single hypermetabolic LEFT level 3 lymph node. Bilateral hypermetabolic pulmonary nodules in a metastatic pattern. Number of metastatic pulmonary nodules much greater on the RIGHT. Minimal hypermetabolic mediastinal nodal metastasis. Cluster of hypermetabolic RIGHT axillary lymph nodes. No evidence of metastatic disease or primary lesion within the  abdomen pelvis. No evidence skeletal metastasis. -07/23/2022-11/09/2022:  Carboplatin , Taxol  and pembrolizumab   -12/16/2022: CT CAP: Stable size and number of numerous pulmonary nodules in the lungs. No new pulmonary nodules are noted. Slight increased prominence of borderline enlarged right axillary lymph nodes. No signs of new metastatic disease in the abdomen or pelvis. Subtle changes in the lungs concerning for probable  interstitial lung disease. -12/16/2022: CT soft tissue neck: Mixed response with some lymph nodes decreased in size and others increased in size from the prior study. -07/16/2023: CT CAP: New mild right axillary and right subcarinal lymphadenopathy. Right paratracheal adenopathy is mildly increased. Stable right hilar adenopathy. Stable peripheral right upper lobe pulmonary nodule. No evidence of metastatic disease in the abdomen or pelvis. Cirrhosis. No ascites. Normal size spleen. -08/04/2023: Right axillary lymph node biopsy. Pathology: Metastatic moderate to poorly differentiated squamous cell carcinoma.  -10/25/2023-05/08/2024: Cetuximab  every 2 weeks, discontinued due to progression -05/02/2024: CT chest: Progressive right axillary nodal metastasis. Additional thoracic nodal metastases, stable versus mildly improved. -05/02/2024: CT soft tissue neck: Further slight enlargement of lymph nodes in the right level 3/level 4 region and in the lower level 3 region. Stable appearance of lymph nodes on the left. Question of vocal fold paresis on the right, possibly with a small mass or polyp along the under surface of the right vocal fold. -05/08/2024 - current: Single agent carboplatin  (AUC 5) every 21 days    CLL: RAI stage I: -06/03/2022: Peripheral blood flow cytometry: Monoclonal B-cell lymphocytosis, high count with a CLL immunophenotype.  CD5, CD19, CD20, CD200, lambda: Positive  Current Treatment:  Single agent carboplatin  (AUC 5) every 21 days   INTERVAL HISTORY:   YAFET CLINE is here today for follow up. Patient is accompanied by his wife.   Patient reported that he is feeling feeling a bit stronger, but still has no significant energy.  Since the hospital discharge, he has improved well. Mihcael is occasionally feeling a pain across his right shoulder to his chest, not associated with shortness of breath.  He associates it with his lymph node in his neck and his right axilla and seems like  musculoskeletal.  He notes some tingling at the bottom of his feet.  I have reviewed the past medical history, past surgical history, social history and family history with the patient and they are unchanged from previous note.  ALLERGIES:  is allergic to penicillins, cetuximab , hct [hydrochlorothiazide], and statins.  MEDICATIONS:  Current Outpatient Medications  Medication Sig Dispense Refill   acetaminophen  (TYLENOL ) 500 MG tablet Take 1,000 mg by mouth every 6 (six) hours as needed for moderate pain.     apixaban  (ELIQUIS ) 5 MG TABS tablet Take 1 tablet (5 mg total) by mouth 2 (two) times daily. 60 tablet 5   Cholecalciferol (VITAMIN D3) 5000 units CAPS Take 5,000 Units by mouth once a week.      clindamycin  (CLINDAGEL) 1 % gel Apply 1 Application topically daily at 6 (six) AM.     digoxin  (LANOXIN ) 0.125 MG tablet TAKE ONE (1) TABLET BY MOUTH EVERY DAY 30 tablet 5   ezetimibe  (ZETIA ) 10 MG tablet TAKE ONE (1) TABLET BY MOUTH EVERY DAY 90 tablet 0   finasteride  (PROSCAR ) 5 MG tablet TAKE ONE (1) TABLET EACH DAY 90 tablet 3   fluocinonide  cream (LIDEX ) 0.05 % Apply 1 Application topically 2 (two) times daily. 60 g 3   fluticasone  (FLONASE ) 50 MCG/ACT nasal spray Place 1 spray into both nostrils daily as needed for  allergies or rhinitis.     furosemide  (LASIX ) 20 MG tablet Take 1 tablet (20 mg total) by mouth daily as needed (shortness of breathe). 30 tablet 1   lovastatin  (MEVACOR ) 40 MG tablet Take 1 tablet (40 mg total) by mouth at bedtime. 90 tablet 3   magnesium  oxide (MAG-OX) 400 (240 Mg) MG tablet Take 1 tablet (400 mg total) by mouth 2 (two) times daily. 60 tablet 3   methylphenidate  (RITALIN ) 5 MG tablet TAKE 1 TABLET BY MOUTH DAILY AS NEEDED 30 tablet 0   metoprolol  succinate (TOPROL -XL) 100 MG 24 hr tablet Take 1 tablet (100 mg total) by mouth daily. Take with or immediately following a meal. 90 tablet 3   montelukast  (SINGULAIR ) 10 MG tablet Take 1 tablet (10 mg total) by mouth  at bedtime. 90 tablet 3   prochlorperazine  (COMPAZINE ) 10 MG tablet Take 1 tablet (10 mg total) by mouth every 6 (six) hours as needed for nausea or vomiting. 30 tablet 2   tobramycin  (TOBREX ) 0.3 % ophthalmic solution Place 1 drop into both eyes in the morning, at noon, in the evening, and at bedtime.     trimethoprim-polymyxin b  (POLYTRIM) ophthalmic solution SMARTSIG:In Eye(s)     No current facility-administered medications for this visit.    REVIEW OF SYSTEMS:   Constitutional: +Fatigue, Denies fevers, chills or abnormal weight loss Ears, nose, mouth, throat, and face: Denies mucositis or sore throat Respiratory: Denies cough, dyspnea or wheezes Cardiovascular: Denies palpitation, chest discomfort or lower extremity swelling Gastrointestinal:  Denies nausea, heartburn or change in bowel habits Skin: Denies abnormal skin rashes Lymphatics: Denies new lymphadenopathy or easy bruising Neurological: +Dizziness, +numbness and tingling in bilateral feet Denies new weaknesses Behavioral/Psych: Mood is stable, no new changes  All other systems were reviewed with the patient and are negative.   VITALS:  Weight 165 lb 3.2 oz (74.9 kg).  Wt Readings from Last 3 Encounters:  08/02/24 165 lb 3.2 oz (74.9 kg)  07/17/24 161 lb (73 kg)  07/11/24 163 lb 5.8 oz (74.1 kg)    Body mass index is 26.66 kg/m.  Performance status (ECOG): 2 - Symptomatic, <50% confined to bed  PHYSICAL EXAM:   GENERAL:alert, no distress and comfortable SKIN: skin color, texture, turgor are normal, no rashes or significant lesions EYES: normal, Conjunctiva are pink and non-injected, sclera clear OROPHARYNX:no exudate, no erythema and lips, buccal mucosa, and tongue normal  LYMPH:  Palpable right cervical level II lymphadenopathy 4 x 3cms, palpable right axillary lymphadenopathy now 2 lymph nodes palpated in the same location 4 x3  and 3 x3 cms LUNGS: clear to auscultation and percussion with normal breathing  effort HEART: regular rate & rhythm and no murmurs and no lower extremity edema ABDOMEN:abdomen soft, non-tender and normal bowel sounds Musculoskeletal:no cyanosis of digits and no clubbing  NEURO: alert & oriented x 3 with fluent speech, no focal motor/sensory deficits  LABORATORY DATA:  I have reviewed the data as listed    Component Value Date/Time   NA 141 08/02/2024 1222   NA 144 07/21/2024 1042   K 4.0 08/02/2024 1222   CL 104 08/02/2024 1222   CO2 27 08/02/2024 1222   GLUCOSE 102 (H) 08/02/2024 1222   BUN 17 08/02/2024 1222   BUN 22 07/21/2024 1042   CREATININE 1.04 08/02/2024 1222   CREATININE 1.10 05/08/2013 0938   CALCIUM  8.8 (L) 08/02/2024 1222   PROT 7.4 08/02/2024 1222   PROT 5.8 (L) 07/17/2024 1635   ALBUMIN 3.7  08/02/2024 1222   ALBUMIN 3.7 07/17/2024 1635   AST 23 08/02/2024 1222   ALT 12 08/02/2024 1222   ALKPHOS 94 08/02/2024 1222   BILITOT 0.3 08/02/2024 1222   BILITOT 0.5 07/17/2024 1635   GFRNONAA >60 08/02/2024 1222   GFRNONAA 64 05/08/2013 0938   GFRAA 69 11/27/2020 0852   GFRAA 74 05/08/2013 0938    Lab Results  Component Value Date   WBC 11.8 (H) 08/02/2024   NEUTROABS 6.0 08/02/2024   HGB 10.9 (L) 08/02/2024   HCT 33.4 (L) 08/02/2024   MCV 107.7 (H) 08/02/2024   PLT 473 (H) 08/02/2024    RADIOGRAPHIC STUDIES: I have personally reviewed the radiological images as listed and agreed with the findings in the report

## 2024-08-02 NOTE — Progress Notes (Signed)
Patient tolerated chemotherapy with no complaints voiced. Side effects with management reviewed understanding verbalized. Port site clean and dry with no bruising or swelling noted at site. Good blood return noted before and after administration of chemotherapy. Band aid applied. Patient left in satisfactory condition with VSS and no s/s of distress noted. 

## 2024-08-02 NOTE — Progress Notes (Signed)
 Patient has been examined by Dr. Davonna. Vital signs and labs have been reviewed by MD - ANC, Creatinine, LFTs, hemoglobin, and platelets have been reviewed by M.D. - pt may proceed with treatment.  Primary RN and pharmacy notified.

## 2024-08-02 NOTE — Patient Instructions (Signed)
 [facility] [place] - A DEPT OF [facility]  Discharge Instructions: Thank you for choosing [facility] to provide your oncology and hematology care.  If you have a lab appointment with the Cancer Center - please note that after April 8th, 2024, all labs will be drawn in the cancer center.  You do not have to check in or register with the main entrance as you have in the past but will complete your check-in in the cancer center.  Wear comfortable clothing and clothing appropriate for easy access to any Portacath or PICC line.   We strive to give you quality time with your provider. You may need to reschedule your appointment if you arrive late (15 or more minutes).  Arriving late affects you and other patients whose appointments are after yours.  Also, if you miss three or more appointments without notifying the office, you may be dismissed from the clinic at the provider's discretion.      For prescription refill requests, have your pharmacy contact our office and allow 72 hours for refills to be completed.    Today you received the following chemotherapy and/or immunotherapy agents Carboplatin , return as scheduled.   To help prevent nausea and vomiting after your treatment, we encourage you to take your nausea medication as directed.  BELOW ARE SYMPTOMS THAT SHOULD BE REPORTED IMMEDIATELY: *FEVER GREATER THAN 100.4 F (38 C) OR HIGHER *CHILLS OR SWEATING *NAUSEA AND VOMITING THAT IS NOT CONTROLLED WITH YOUR NAUSEA MEDICATION *UNUSUAL SHORTNESS OF BREATH *UNUSUAL BRUISING OR BLEEDING *URINARY PROBLEMS (pain or burning when urinating, or frequent urination) *BOWEL PROBLEMS (unusual diarrhea, constipation, pain near the anus) TENDERNESS IN MOUTH AND THROAT WITH OR WITHOUT PRESENCE OF ULCERS (sore throat, sores in mouth, or a toothache) UNUSUAL RASH, SWELLING OR PAIN  UNUSUAL VAGINAL DISCHARGE OR ITCHING   Items with * indicate a potential emergency and  should be followed up as soon as possible or go to the Emergency Department if any problems should occur.  Please show the CHEMOTHERAPY ALERT CARD or IMMUNOTHERAPY ALERT CARD at check-in to the Emergency Department and triage nurse.  Should you have questions after your visit or need to cancel or reschedule your appointment, please contact [facility] [place] - A DEPT OF [facility] [phone]  and follow the prompts.  Office hours are 8:00 a.m. to 4:30 p.m. Monday - Friday. Please note that voicemails left after 4:00 p.m. may not be returned until the following business day.  We are closed weekends and major holidays. You have access to a nurse at all times for urgent questions. Please call the main number to the clinic [phone] and follow the prompts.  For any non-urgent questions, you may also contact your provider using MyChart. We now offer e-Visits for anyone 13 and older to request care online for non-urgent symptoms. For details visit [email or web address].   Also download the MyChart app! Go to the app store, search MyChart, open the app, select , and log in with your MyChart username and password.

## 2024-08-03 ENCOUNTER — Other Ambulatory Visit: Payer: Self-pay | Admitting: Family Medicine

## 2024-08-03 ENCOUNTER — Inpatient Hospital Stay: Admitting: Family Medicine

## 2024-08-04 ENCOUNTER — Inpatient Hospital Stay

## 2024-08-04 VITALS — BP 127/83 | HR 82 | Temp 97.8°F | Resp 18

## 2024-08-04 DIAGNOSIS — Z5111 Encounter for antineoplastic chemotherapy: Secondary | ICD-10-CM | POA: Diagnosis not present

## 2024-08-04 DIAGNOSIS — Z95828 Presence of other vascular implants and grafts: Secondary | ICD-10-CM

## 2024-08-04 DIAGNOSIS — C4442 Squamous cell carcinoma of skin of scalp and neck: Secondary | ICD-10-CM

## 2024-08-04 MED ORDER — PEGFILGRASTIM-CBQV 6 MG/0.6ML ~~LOC~~ SOSY
6.0000 mg | PREFILLED_SYRINGE | Freq: Once | SUBCUTANEOUS | Status: AC
  Administered 2024-08-04: 6 mg via SUBCUTANEOUS
  Filled 2024-08-04: qty 0.6

## 2024-08-04 NOTE — Patient Instructions (Signed)

## 2024-08-04 NOTE — Progress Notes (Signed)
 Patient tolerated injection with no complaints voiced.  Site clean and dry with no bruising or swelling noted at site.  See MAR for details.  Band aid applied.  Patient stable during and after injection.  Vss with discharge and left in satisfactory condition with no s/s of distress noted.

## 2024-08-16 ENCOUNTER — Ambulatory Visit (HOSPITAL_COMMUNITY)
Admission: RE | Admit: 2024-08-16 | Discharge: 2024-08-16 | Disposition: A | Discharge status: Home / Self Care | Source: Ambulatory Visit | Attending: Oncology | Admitting: Oncology

## 2024-08-16 ENCOUNTER — Other Ambulatory Visit: Payer: Self-pay | Admitting: Oncology

## 2024-08-16 DIAGNOSIS — C4442 Squamous cell carcinoma of skin of scalp and neck: Secondary | ICD-10-CM | POA: Insufficient documentation

## 2024-08-16 DIAGNOSIS — Z95828 Presence of other vascular implants and grafts: Secondary | ICD-10-CM

## 2024-08-16 MED ORDER — IOHEXOL 300 MG/ML  SOLN
100.0000 mL | Freq: Once | INTRAMUSCULAR | Status: AC | PRN
  Administered 2024-08-16: 100 mL via INTRAVENOUS

## 2024-08-21 NOTE — Progress Notes (Signed)
 " Patient Care Team: [name], Fonda LABOR, MD as PCP - General (Family Medicine) [name], MD as PCP - Cardiology (Cardiology) Watt Rush, MD as Attending Physician (Urology) [name], MD as Consulting Physician (Cardiology) Golda Claudis PENNER, MD (Inactive) as Consulting Physician (Gastroenterology) Perley Hamilton, MD as Consulting Physician (Ophthalmology) Heide Ingle, MD as Consulting Physician (Orthopedic Surgery) Frances Ozell RAMAN, LCSW as [facility] Care Management (Licensed Clinical Social Worker) Celestia Joesph SQUIBB, RN as Oncology Nurse Navigator (Medical Oncology)  Clinic Day:  08/21/2024  Referring physician: [name], Fonda LABOR, MD   CHIEF COMPLAINT:  CC:  Metastatic squamous cell carcinoma, 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  Has no chemotherapy induced side effects at this time -We reviewed the CT CAP findings together.  He has a new right upper lobe lesion that is suspicious for metastasis but remaining areas are unchanged. -Will obtain a CT neck soft tissues to assess the neck lymph nodes. - I discussed treatment options at this time.  Patient would like to continue treatments to live longer.  Discussed likely getting radiation therapy to  this lesion that is increasing in size and continuing on current treatment.  If SBRT is not an option for him, can consider changing systemic treatment to capecitabine.  Will refer to radiation oncology today. -Patient does understand that he does not have a lot of treatment options available at this time. - Labs reviewed today.  CMP: Normal creatinine, normal LFTs, CBC: WBC: 12.1, hemoglobin: 10.0, platelets:288 - Physical exam stable today.  Proceed with treatment today. -Continue carboplatin  every 3 weeks for now  Return to clinic in 3 weeks with labs prior to next cycle of chemotherapy and after radiation oncology assessment.  CLL (chronic lymphocytic leukemia)  Patient has history of CLL diagnosed in 2023.  RAI stage I No B symptoms  - Does not meet criteria for treatment at this time.  Hemoglobin dropping but likely secondary to chemotherapy induced at this time - Continue to monitor  Neoplastic malignant related fatigue Patient has cancer-related fatigue and is on Ritalin  5mg  as needed - Continue Ritalin  5 mg as needed.  Rash Rash resolved.  Secondary to cetuximab   Macrocytic anemia Likely secondary to myelosuppression from chemotherapy or CLL Currently asymptomatic  - Will obtain anemia panel with next blood draw.   The patient understands the plans discussed today and is in agreement with them.  He knows to contact our office if he develops concerns prior to his next appointment.  The total time spent in the appointment was 25  minutes for the encounter with patient, including review of chart and various tests results, discussions about plan of care and coordination of care plan    Greg Dry, MD  [name] [place] - A DEPT OF [facility] [address] [place]  [place] [address] Dept: [phone] Dept Fax: [phone]   No orders of the defined types were placed in this encounter.    ONCOLOGY HISTORY:    I have reviewed his chart and materials related to his cancer extensively and collaborated history with the patient. 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 Solitary pathologic lymph node in the left neck also highly suspicious for metastatic disease. Multiple lung nodule in the a lung apices bilaterally compatible with metastatic disease. -05/28/2022: CT CAP: Large nodal mass in the right supraclavicular region with bilateral pulmonary nodules. In addition, slightly enlarged lymph nodes in the right axilla and right sub pectoralis region. Primary neoplastic source may be located in the right supraclavicular region but etiology is uncertain based on imaging. No evidence for metastatic disease in the abdomen or pelvis. Liver has a slightly nodular contour.  -06/08/2022: Brain MRI: No evidence of mass lesion.  -06/09/2022: Right chest supraclavicular lymph node excision  Pathology: Metastatic poorly differentiated carcinoma.T e metastatic carcinoma is positive with p40, p63,  cytokeratin 5/6 and shows patchy positivity with cytokeratin 7.  GATA3  shows focal, weak likely nonspecific staining. The immunophenotype is most consistent with metastatic squamous cell carcinoma.  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 Met amplification, RET rearrangement, PTEN deletion: Not detected [employer] and [employer]: NGS: TP53 pathogenic variant, MSI-stable, TMB-low, LOH-low, PD-L1 (SP142): Negative, p16 and p18 negative.  HER2 by IHC 0.pan-TRK: Not expressed PD-L1 22C3: CPS score 25 Cancer type ID: 96% probability squamous cell carcinoma, subtype head and neck/skin.  Cancer types ruled out with 95% confidence includes skin basal cell carcinoma.  -06/11/2022:  Initial PET: Bulky RIGHT supraclavicular nodal mass with hypermetabolic activity. Findings consistent with metastatic adenopathy. Smaller hypermetabolic RIGHT posterior triangle and jugular lymph nodes. Single hypermetabolic LEFT level 3 lymph node. Bilateral hypermetabolic pulmonary nodules in a metastatic pattern. Number of metastatic pulmonary nodules much greater on the RIGHT. Minimal hypermetabolic mediastinal nodal metastasis. Cluster of hypermetabolic RIGHT axillary lymph nodes. No evidence of metastatic disease or primary lesion within the abdomen pelvis. No evidence skeletal metastasis. -07/23/2022-11/09/2022:  Carboplatin , Taxol  and pembrolizumab   -12/16/2022: CT CAP: Stable size and number of numerous pulmonary nodules in the lungs. No new pulmonary nodules are noted. Slight increased prominence of borderline enlarged right axillary lymph nodes. No signs of new metastatic disease in the abdomen or pelvis. Subtle changes in the lungs concerning for probable interstitial lung disease. -12/16/2022: CT soft tissue neck: Mixed response with some lymph nodes decreased in size and others increased in size from the prior study. -07/16/2023: CT CAP: New mild right axillary and right subcarinal lymphadenopathy. Right paratracheal adenopathy is mildly increased. Stable right hilar adenopathy. Stable peripheral right upper lobe pulmonary nodule. No evidence of metastatic disease in the abdomen or pelvis. Cirrhosis. No ascites. Normal size spleen. -08/04/2023: Right axillary lymph node biopsy. Pathology: Metastatic moderate to poorly differentiated squamous cell carcinoma.  -10/25/2023-05/08/2024: Cetuximab  every 2 weeks, discontinued due to progression -05/02/2024: CT chest: Progressive right axillary nodal metastasis. Additional thoracic nodal metastases, stable versus mildly improved. -05/02/2024: CT soft tissue neck: Further slight enlargement of lymph nodes in the right level 3/level 4 region and in the  lower level 3 region. Stable appearance of lymph nodes on the left. Question of vocal fold paresis  on the right, possibly with a small mass or polyp along the under surface of the right vocal fold. -05/08/2024 - current: Single agent carboplatin  (AUC 5) every 21 days  -08/16/2024: CT CAP: New nodule of the medial anterior right upper lobe measuring 1.1 x 0.9 cm, highly suspicious for a metastasis.Unchanged bulky right axillary and right hilar lymphadenopathy. Slightly diminished size of enlarged pretracheal lymph nodes.Unchanged mild bibasilar predominant pulmonary fibrosis. No evidence of lymphadenopathy or metastatic disease in the abdomen or pelvis.   CLL: RAI stage I: -06/03/2022: Peripheral blood flow cytometry: Monoclonal B-cell lymphocytosis, high count with a CLL immunophenotype.  CD5, CD19, CD20, CD200, lambda: Positive  Current Treatment:  Single agent carboplatin  (AUC 5) every 21 days   INTERVAL HISTORY:   Discussed the use of AI scribe software for clinical note transcription with the patient, who gave verbal consent to proceed.  History of Present Illness [name] is an 89 year old male with metastatic cancer of presumed head and neck primary who presents for follow-up of his treatment response.  He has been experiencing persistent fatigue and a lack of energy, stating 'I can't hardly walk.' He also reports significant knee pain, which he attributes to his ongoing treatment. No nausea, vomiting, diarrhea, or fever.  He has a history of metastatic cancer, with a recent CT scan revealing a new 1 cm lesion in the right upper lobe of his lung. He is currently undergoing chemotherapy.    I have reviewed the past medical history, past surgical history, social history and family history with the patient and they are unchanged from previous note.  ALLERGIES:  is allergic to penicillins, cetuximab , hct [hydrochlorothiazide], and statins.  MEDICATIONS:  Current Outpatient  Medications  Medication Sig Dispense Refill   acetaminophen  (TYLENOL ) 500 MG tablet Take 1,000 mg by mouth every 6 (six) hours as needed for moderate pain.     apixaban  (ELIQUIS ) 5 MG TABS tablet Take 1 tablet (5 mg total) by mouth 2 (two) times daily. 60 tablet 5   Cholecalciferol (VITAMIN D3) 5000 units CAPS Take 5,000 Units by mouth once a week.      clindamycin  (CLINDAGEL) 1 % gel Apply 1 Application topically daily at 6 (six) AM.     digoxin  (LANOXIN ) 0.125 MG tablet TAKE ONE (1) TABLET BY MOUTH EVERY DAY 30 tablet 5   ezetimibe  (ZETIA ) 10 MG tablet TAKE ONE (1) TABLET BY MOUTH EVERY DAY 90 tablet 0   finasteride  (PROSCAR ) 5 MG tablet TAKE ONE (1) TABLET EACH DAY 90 tablet 3   fluocinonide  cream (LIDEX ) 0.05 % Apply 1 Application topically 2 (two) times daily. 60 g 3   fluticasone  (FLONASE ) 50 MCG/ACT nasal spray Place 1 spray into both nostrils daily as needed for allergies or rhinitis.     furosemide  (LASIX ) 20 MG tablet Take 1 tablet (20 mg total) by mouth daily as needed (shortness of breathe). 30 tablet 1   lovastatin  (MEVACOR ) 40 MG tablet Take 1 tablet (40 mg total) by mouth at bedtime. 90 tablet 3   magnesium  oxide (MAG-OX) 400 (240 Mg) MG tablet Take 1 tablet (400 mg total) by mouth 2 (two) times daily. 60 tablet 3   methylphenidate  (RITALIN ) 5 MG tablet TAKE 1 TABLET BY MOUTH DAILY AS NEEDED 30 tablet 0   metoprolol  succinate (TOPROL -XL) 100 MG 24 hr tablet Take 1 tablet (100 mg total) by mouth daily. Take with or immediately following a meal. 90 tablet 3   montelukast  (SINGULAIR ) 10 MG tablet  Take 1 tablet (10 mg total) by mouth at bedtime. 90 tablet 3   prochlorperazine  (COMPAZINE ) 10 MG tablet Take 1 tablet (10 mg total) by mouth every 6 (six) hours as needed for nausea or vomiting. 30 tablet 2   tobramycin  (TOBREX ) 0.3 % ophthalmic solution Place 1 drop into both eyes in the morning, at noon, in the evening, and at bedtime.     trimethoprim-polymyxin b  (POLYTRIM) ophthalmic  solution SMARTSIG:In Eye(s)     No current facility-administered medications for this visit.    REVIEW OF SYSTEMS:   Constitutional: +Fatigue, Denies fevers, chills or abnormal weight loss Ears, nose, mouth, throat, and face: Denies mucositis or sore throat Respiratory: Denies cough, dyspnea or wheezes Cardiovascular: Denies palpitation, chest discomfort or lower extremity swelling Gastrointestinal:  Denies nausea, heartburn or change in bowel habits Skin: Denies abnormal skin rashes Lymphatics: Denies new lymphadenopathy or easy bruising Neurological: +numbness and tingling in bilateral feet Denies new weaknesses Behavioral/Psych: Mood is stable, no new changes  All other systems were reviewed with the patient and are negative.   VITALS:  There were no vitals taken for this visit.  Wt Readings from Last 3 Encounters:  08/02/24 165 lb 3.2 oz (74.9 kg)  07/17/24 161 lb (73 kg)  07/11/24 163 lb 5.8 oz (74.1 kg)    There is no height or weight on file to calculate BMI.  Performance status (ECOG): 2 - Symptomatic, <50% confined to bed  PHYSICAL EXAM:   GENERAL:alert, no distress and comfortable SKIN: skin color, texture, turgor are normal, no rashes or significant lesions EYES: normal, Conjunctiva are pink and non-injected, sclera clear OROPHARYNX:no exudate, no erythema and lips, buccal mucosa, and tongue normal  LYMPH:  Palpable right cervical level II lymphadenopathy 4 x 3cms, palpable right axillary lymphadenopathy 2 x 2 cms (significantly smaller) LUNGS: clear to auscultation and percussion with normal breathing effort HEART: regular rate & rhythm and no murmurs and no lower extremity edema ABDOMEN:abdomen soft, non-tender and normal bowel sounds Musculoskeletal:no cyanosis of digits and no clubbing  NEURO: alert & oriented x 3 with fluent speech, no focal motor/sensory deficits  LABORATORY DATA:  I have reviewed the data as listed    Component Value Date/Time   NA 141  08/02/2024 1222   NA 144 07/21/2024 1042   K 4.0 08/02/2024 1222   CL 104 08/02/2024 1222   CO2 27 08/02/2024 1222   GLUCOSE 102 (H) 08/02/2024 1222   BUN 17 08/02/2024 1222   BUN 22 07/21/2024 1042   CREATININE 1.04 08/02/2024 1222   CREATININE 1.10 05/08/2013 0938   CALCIUM  8.8 (L) 08/02/2024 1222   PROT 7.4 08/02/2024 1222   PROT 5.8 (L) 07/17/2024 1635   ALBUMIN 3.7 08/02/2024 1222   ALBUMIN 3.7 07/17/2024 1635   AST 23 08/02/2024 1222   ALT 12 08/02/2024 1222   ALKPHOS 94 08/02/2024 1222   BILITOT 0.3 08/02/2024 1222   BILITOT 0.5 07/17/2024 1635   GFRNONAA >60 08/02/2024 1222   GFRNONAA 64 05/08/2013 0938   GFRAA 69 11/27/2020 0852   GFRAA 74 05/08/2013 0938    Lab Results  Component Value Date   WBC 11.8 (H) 08/02/2024   NEUTROABS 6.0 08/02/2024   HGB 10.9 (L) 08/02/2024   HCT 33.4 (L) 08/02/2024   MCV 107.7 (H) 08/02/2024   PLT 473 (H) 08/02/2024    RADIOGRAPHIC STUDIES: I have personally reviewed the radiological images as listed and agreed with the findings in the report   CT CHEST ABDOMEN  PELVIS W CONTRAST CLINICAL DATA:  Metastatic squamous cell carcinoma, 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  RADIATION DOSE REDUCTION: This exam was performed according to the departmental dose-optimization program which includes automated exposure control, adjustment of the mA and/or kV according to patient size and/or use of iterative reconstruction technique.  CONTRAST:  OMNIPAQUE  IOHEXOL  300 MG/ML  SOLN  COMPARISON:  CT chest, 05/02/2024, CT abdomen pelvis, 06/01/2024  FINDINGS: CT CHEST FINDINGS  Cardiovascular: Left chest port catheter. Aortic atherosclerosis. Cardiomegaly. Three-vessel coronary artery  calcifications status post median sternotomy and CABG. No pericardial effusion.  Mediastinum/Nodes: Unchanged bulky right axillary lymph nodes measuring up 2 3.8 x 2.7 cm (series 3, image 26). Unchanged enlarged right hilar lymph nodes measuring up to 2.5 x 1.8 cm (series 3, image 36). Slightly diminished size of enlarged pretracheal lymph nodes measuring up to 1.8 x 1.7 cm, previously up to 2.6 x 2.4 cm (series 3, image 22). Thyroid  gland, trachea, and esophagus demonstrate no significant findings.  Lungs/Pleura: New nodule of the medial anterior right upper lobe measuring 1.1 x 0.9 cm (series 8, image 61). Unchanged small nodule of the posterior left upper lobe measuring 0.3 cm (series 8, image 56). Unchanged mild bibasilar predominant pulmonary fibrosis featuring irregular peripheral interstitial opacity, septal thickening, minimal subpleural bronchiolectasis. No pleural effusion or pneumothorax.  Musculoskeletal: No chest wall abnormality. No acute osseous findings.  CT ABDOMEN PELVIS FINDINGS  Hepatobiliary: No solid liver abnormality is seen. Coarse, nodular contour of the liver. Gallstones. No gallbladder wall thickening. No biliary ductal dilatation.  Pancreas: Unremarkable. No pancreatic ductal dilatation or surrounding inflammatory changes.  Spleen: Mild splenomegaly, maximum coronal span 13.5 cm.  Adrenals/Urinary Tract: Adrenal glands are unremarkable. Simple, benign bilateral renal cortical cysts, for which no further follow-up or characterization is required kidneys are otherwise normal, without renal calculi, solid lesion, or hydronephrosis. Bladder is unremarkable.  Stomach/Bowel: Stomach is within normal limits. Appendix not clearly visualized and may be surgically absent. No evidence of bowel wall thickening, distention, or inflammatory changes. Sigmoid diverticulosis.  Vascular/Lymphatic: Severe aortic atherosclerosis. No enlarged abdominal or pelvic  lymph nodes.  Reproductive: Evaluation very limited by dense metallic streak artifact from bilateral hip arthroplasty.  Other: No abdominal wall hernia or abnormality. No ascites.  Musculoskeletal: No acute osseous findings. Status post bilateral hip total arthroplasty.  IMPRESSION: 1. New nodule of the medial anterior right upper lobe measuring 1.1 x 0.9 cm, highly suspicious for a metastasis. 2. Unchanged bulky right axillary and right hilar lymphadenopathy. Slightly diminished size of enlarged pretracheal lymph nodes. 3. Unchanged mild bibasilar predominant pulmonary fibrosis. 4. No evidence of lymphadenopathy or metastatic disease in the abdomen or pelvis. 5. Cirrhosis. Mild splenomegaly. 6. Cholelithiasis. 7. Coronary artery disease. 8. Aortic atherosclerosis.  Aortic Atherosclerosis (ICD10-I70.0).  Electronically Signed   By: Marolyn JONETTA Jaksch M.D.   On: 08/21/2024 13:29   "

## 2024-08-22 ENCOUNTER — Encounter: Payer: Self-pay | Admitting: Oncology

## 2024-08-22 ENCOUNTER — Inpatient Hospital Stay

## 2024-08-22 ENCOUNTER — Inpatient Hospital Stay: Admitting: Oncology

## 2024-08-22 ENCOUNTER — Inpatient Hospital Stay: Attending: Hematology

## 2024-08-22 VITALS — BP 128/79 | HR 76 | Temp 96.3°F | Resp 18

## 2024-08-22 DIAGNOSIS — Z5111 Encounter for antineoplastic chemotherapy: Secondary | ICD-10-CM | POA: Insufficient documentation

## 2024-08-22 DIAGNOSIS — C778 Secondary and unspecified malignant neoplasm of lymph nodes of multiple regions: Secondary | ICD-10-CM | POA: Diagnosis not present

## 2024-08-22 DIAGNOSIS — R530 Neoplastic (malignant) related fatigue: Secondary | ICD-10-CM

## 2024-08-22 DIAGNOSIS — D539 Nutritional anemia, unspecified: Secondary | ICD-10-CM | POA: Diagnosis not present

## 2024-08-22 DIAGNOSIS — D649 Anemia, unspecified: Secondary | ICD-10-CM

## 2024-08-22 DIAGNOSIS — C801 Malignant (primary) neoplasm, unspecified: Secondary | ICD-10-CM | POA: Diagnosis not present

## 2024-08-22 DIAGNOSIS — Z5189 Encounter for other specified aftercare: Secondary | ICD-10-CM | POA: Diagnosis not present

## 2024-08-22 DIAGNOSIS — C4442 Squamous cell carcinoma of skin of scalp and neck: Secondary | ICD-10-CM | POA: Diagnosis not present

## 2024-08-22 DIAGNOSIS — Z95828 Presence of other vascular implants and grafts: Secondary | ICD-10-CM

## 2024-08-22 DIAGNOSIS — C9110 Chronic lymphocytic leukemia of B-cell type not having achieved remission: Secondary | ICD-10-CM | POA: Insufficient documentation

## 2024-08-22 DIAGNOSIS — R918 Other nonspecific abnormal finding of lung field: Secondary | ICD-10-CM | POA: Diagnosis not present

## 2024-08-22 LAB — CBC WITH DIFFERENTIAL/PLATELET
Abs Immature Granulocytes: 0.22 K/uL — ABNORMAL HIGH (ref 0.00–0.07)
Basophils Absolute: 0 K/uL (ref 0.0–0.1)
Basophils Relative: 0 %
Eosinophils Absolute: 0.1 K/uL (ref 0.0–0.5)
Eosinophils Relative: 0 %
HCT: 31.3 % — ABNORMAL LOW (ref 39.0–52.0)
Hemoglobin: 10 g/dL — ABNORMAL LOW (ref 13.0–17.0)
Immature Granulocytes: 2 %
Lymphocytes Relative: 30 %
Lymphs Abs: 3.6 K/uL (ref 0.7–4.0)
MCH: 36 pg — ABNORMAL HIGH (ref 26.0–34.0)
MCHC: 31.9 g/dL (ref 30.0–36.0)
MCV: 112.6 fL — ABNORMAL HIGH (ref 80.0–100.0)
Monocytes Absolute: 0.9 K/uL (ref 0.1–1.0)
Monocytes Relative: 8 %
Neutro Abs: 7.2 K/uL (ref 1.7–7.7)
Neutrophils Relative %: 60 %
Platelets: 288 K/uL (ref 150–400)
RBC: 2.78 MIL/uL — ABNORMAL LOW (ref 4.22–5.81)
RDW: 16 % — ABNORMAL HIGH (ref 11.5–15.5)
Smear Review: NORMAL
WBC: 12.1 K/uL — ABNORMAL HIGH (ref 4.0–10.5)
nRBC: 0 % (ref 0.0–0.2)

## 2024-08-22 LAB — COMPREHENSIVE METABOLIC PANEL WITH GFR
ALT: 12 U/L (ref 0–44)
AST: 22 U/L (ref 15–41)
Albumin: 3.8 g/dL (ref 3.5–5.0)
Alkaline Phosphatase: 102 U/L (ref 38–126)
Anion gap: 10 (ref 5–15)
BUN: 15 mg/dL (ref 8–23)
CO2: 28 mmol/L (ref 22–32)
Calcium: 8.8 mg/dL — ABNORMAL LOW (ref 8.9–10.3)
Chloride: 105 mmol/L (ref 98–111)
Creatinine, Ser: 1.06 mg/dL (ref 0.61–1.24)
GFR, Estimated: >60 mL/min (ref >60)
Glucose, Bld: 159 mg/dL — ABNORMAL HIGH (ref 70–99)
Potassium: 4 mmol/L (ref 3.5–5.1)
Sodium: 142 mmol/L (ref 135–145)
Total Bilirubin: 0.3 mg/dL (ref 0.0–1.2)
Total Protein: 6.3 g/dL — ABNORMAL LOW (ref 6.5–8.1)

## 2024-08-22 LAB — MAGNESIUM: Magnesium: 2 mg/dL (ref 1.7–2.4)

## 2024-08-22 MED ORDER — SODIUM CHLORIDE 0.9 % IV SOLN
400.0000 mg | Freq: Once | INTRAVENOUS | Status: AC
  Administered 2024-08-22: 400 mg via INTRAVENOUS
  Filled 2024-08-22: qty 40

## 2024-08-22 MED ORDER — FAMOTIDINE IN NACL 20-0.9 MG/50ML-% IV SOLN
20.0000 mg | Freq: Once | INTRAVENOUS | Status: AC
  Administered 2024-08-22: 20 mg via INTRAVENOUS
  Filled 2024-08-22: qty 50

## 2024-08-22 MED ORDER — DEXAMETHASONE SOD PHOSPHATE PF 10 MG/ML IJ SOLN
10.0000 mg | Freq: Once | INTRAMUSCULAR | Status: AC
  Administered 2024-08-22: 10 mg via INTRAVENOUS

## 2024-08-22 MED ORDER — PALONOSETRON HCL INJECTION 0.25 MG/5ML
0.2500 mg | Freq: Once | INTRAVENOUS | Status: AC
  Administered 2024-08-22: 0.25 mg via INTRAVENOUS
  Filled 2024-08-22: qty 5

## 2024-08-22 MED ORDER — CETIRIZINE HCL 10 MG/ML IV SOLN
10.0000 mg | Freq: Once | INTRAVENOUS | Status: AC
  Administered 2024-08-22: 10 mg via INTRAVENOUS
  Filled 2024-08-22: qty 1

## 2024-08-22 MED ORDER — SODIUM CHLORIDE 0.9 % IV SOLN
150.0000 mg | Freq: Once | INTRAVENOUS | Status: AC
  Administered 2024-08-22: 150 mg via INTRAVENOUS
  Filled 2024-08-22: qty 5

## 2024-08-22 MED ORDER — SODIUM CHLORIDE 0.9 % IV SOLN: INTRAVENOUS | Status: DC

## 2024-08-22 NOTE — Patient Instructions (Signed)
 [facility] at [facility] Discharge Instructions   You were seen and examined today by Dr. Davonna.  She reviewed the results of your lab work which are normal/stable.   She reviewed the results of your CT scan which is showing a new spot on the lung. Dr. Davonna recommends you get radiation to the new lung nodule. We will refer you to [facility] radiation oncology for evaluation.   We will proceed with your treatment today.   Return as scheduled.    Thank you for choosing [facility] at [facility] to provide your oncology and hematology care.  To afford each patient quality time with our provider, please arrive at least 15 minutes before your scheduled appointment time.   If you have a lab appointment with the Cancer Center please come in thru the Main Entrance and check in at the main information desk.  You need to re-schedule your appointment should you arrive 10 or more minutes late.  We strive to give you quality time with our providers, and arriving late affects you and other patients whose appointments are after yours.  Also, if you no show three or more times for appointments you may be dismissed from the clinic at the providers discretion.     Again, thank you for choosing [facility].  Our hope is that these requests will decrease the amount of time that you wait before being seen by our physicians.       _____________________________________________________________  Should you have questions after your visit to [facility], please contact our office at [phone] and follow the prompts.  Our office hours are 8:00 a.m. and 4:30 p.m. Monday - Friday.  Please note that voicemails left after 4:00 p.m. may not be returned until the following business day.  We are closed weekends and major holidays.  You do have access to a nurse 24-7, just call the main number to the clinic [phone] and do not press any  options, hold on the line and a nurse will answer the phone.    For prescription refill requests, have your pharmacy contact our office and allow 72 hours.    Due to Covid, you will need to wear a mask upon entering the hospital. If you do not have a mask, a mask will be given to you at the Main Entrance upon arrival. For doctor visits, patients may have 1 support person age 66 or older with them. For treatment visits, patients can not have anyone with them due to social distancing guidelines and our immunocompromised population.

## 2024-08-22 NOTE — Patient Instructions (Signed)
 [facility] [place] - A DEPT OF [facility]  Discharge Instructions: Thank you for choosing [facility] to provide your oncology and hematology care.  If you have a lab appointment with the Cancer Center - please note that after April 8th, 2024, all labs will be drawn in the cancer center.  You do not have to check in or register with the main entrance as you have in the past but will complete your check-in in the cancer center.  Wear comfortable clothing and clothing appropriate for easy access to any Portacath or PICC line.   We strive to give you quality time with your provider. You may need to reschedule your appointment if you arrive late (15 or more minutes).  Arriving late affects you and other patients whose appointments are after yours.  Also, if you miss three or more appointments without notifying the office, you may be dismissed from the clinic at the provider's discretion.      For prescription refill requests, have your pharmacy contact our office and allow 72 hours for refills to be completed.    Today you received the following chemotherapy and/or immunotherapy agents carboplatin .  Carboplatin  Injection What is this medication? CARBOPLATIN  (KAR boe pla tin) treats some types of cancer. It works by slowing down the growth of cancer cells. This medicine may be used for other purposes; ask your health care provider or pharmacist if you have questions. COMMON BRAND NAME(S): Paraplatin  What should I tell my care team before I take this medication? They need to know if you have any of these conditions: Blood disorders Hearing problems Kidney disease Recent or ongoing radiation therapy An unusual or allergic reaction to carboplatin , cisplatin, other medications, foods, dyes, or preservatives Pregnant or trying to get pregnant Breast-feeding How should I use this medication? This medication is injected into a vein. It is given by your care team  in a hospital or clinic setting. Talk to your care team about the use of this medication in children. Special care may be needed. Overdosage: If you think you have taken too much of this medicine contact a poison control center or emergency room at once. NOTE: This medicine is only for you. Do not share this medicine with others. What if I miss a dose? Keep appointments for follow-up doses. It is important not to miss your dose. Call your care team if you are unable to keep an appointment. What may interact with this medication? Medications for seizures Some antibiotics, such as amikacin, gentamicin, neomycin , streptomycin, tobramycin  Vaccines This list may not describe all possible interactions. Give your health care provider a list of all the medicines, herbs, non-prescription drugs, or dietary supplements you use. Also tell them if you smoke, drink alcohol , or use illegal drugs. Some items may interact with your medicine. What should I watch for while using this medication? Your condition will be monitored carefully while you are receiving this medication. You may need blood work while taking this medication. This medication may make you feel generally unwell. This is not uncommon, as chemotherapy can affect healthy cells as well as cancer cells. Report any side effects. Continue your course of treatment even though you feel ill unless your care team tells you to stop. In some cases, you may be given additional medications to help with side effects. Follow all directions for their use. This medication may increase your risk of getting an infection. Call your care team for advice if  you get a fever, chills, sore throat, or other symptoms of a cold or flu. Do not treat yourself. Try to avoid being around people who are sick. Avoid taking medications that contain aspirin, acetaminophen , ibuprofen, naproxen, or ketoprofen unless instructed by your care team. These medications may hide a fever. Be  careful brushing or flossing your teeth or using a toothpick because you may get an infection or bleed more easily. If you have any dental work done, tell your dentist you are receiving this medication. Talk to your care team if you wish to become pregnant or think you might be pregnant. This medication can cause serious birth defects. Talk to your care team about effective forms of contraception. Do not breast-feed while taking this medication. What side effects may I notice from receiving this medication? Side effects that you should report to your care team as soon as possible: Allergic reactions--skin rash, itching, hives, swelling of the face, lips, tongue, or throat Infection--fever, chills, cough, sore throat, wounds that don't heal, pain or trouble when passing urine, general feeling of discomfort or being unwell Low red blood cell level--unusual weakness or fatigue, dizziness, headache, trouble breathing Pain, tingling, or numbness in the hands or feet, muscle weakness, change in vision, confusion or trouble speaking, loss of balance or coordination, trouble walking, seizures Unusual bruising or bleeding Side effects that usually do not require medical attention (report to your care team if they continue or are bothersome): Hair loss Nausea Unusual weakness or fatigue Vomiting This list may not describe all possible side effects. Call your doctor for medical advice about side effects. You may report side effects to FDA at 1-800-FDA-1088. Where should I keep my medication? This medication is given in a hospital or clinic. It will not be stored at home. NOTE: This sheet is a summary. It may not cover all possible information. If you have questions about this medicine, talk to your doctor, pharmacist, or health care provider.  2024 Elsevier/Gold Standard (2022-01-20 00:00:00)      To help prevent nausea and vomiting after your treatment, we encourage you to take your nausea medication as  directed.  BELOW ARE SYMPTOMS THAT SHOULD BE REPORTED IMMEDIATELY: *FEVER GREATER THAN 100.4 F (38 C) OR HIGHER *CHILLS OR SWEATING *NAUSEA AND VOMITING THAT IS NOT CONTROLLED WITH YOUR NAUSEA MEDICATION *UNUSUAL SHORTNESS OF BREATH *UNUSUAL BRUISING OR BLEEDING *URINARY PROBLEMS (pain or burning when urinating, or frequent urination) *BOWEL PROBLEMS (unusual diarrhea, constipation, pain near the anus) TENDERNESS IN MOUTH AND THROAT WITH OR WITHOUT PRESENCE OF ULCERS (sore throat, sores in mouth, or a toothache) UNUSUAL RASH, SWELLING OR PAIN  UNUSUAL VAGINAL DISCHARGE OR ITCHING   Items with * indicate a potential emergency and should be followed up as soon as possible or go to the Emergency Department if any problems should occur.  Please show the CHEMOTHERAPY ALERT CARD or IMMUNOTHERAPY ALERT CARD at check-in to the Emergency Department and triage nurse.  Should you have questions after your visit or need to cancel or reschedule your appointment, please contact [facility] [place] - A DEPT OF [facility] [phone]  and follow the prompts.  Office hours are 8:00 a.m. to 4:30 p.m. Monday - Friday. Please note that voicemails left after 4:00 p.m. may not be returned until the following business day.  We are closed weekends and major holidays. You have access to a nurse at all times for urgent questions. Please call the main number to the clinic  [phone] and follow the prompts.  For any non-urgent questions, you may also contact your provider using MyChart. We now offer e-Visits for anyone 7 and older to request care online for non-urgent symptoms. For details visit [email or web address].   Also download the MyChart app! Go to the app store, search MyChart, open the app, select [place], and log in with your MyChart username and password.

## 2024-08-22 NOTE — Progress Notes (Signed)
 Patient presents today for Carboplatin  treatment and follow up visit with Dr. Davonna. Vital signs within parameters for treatment. Labs pending.   Message received from Dr. Davonna / A.Anderson RN to proceed with treatment.   Treatment given today per MD orders. Tolerated infusion without adverse affects. Vital signs stable. No complaints at this time. Discharged from clinic by wheel chair in stable condition. Alert and oriented x 3. F/U with [facility] as scheduled.

## 2024-08-23 ENCOUNTER — Inpatient Hospital Stay

## 2024-08-23 ENCOUNTER — Encounter: Payer: Self-pay | Admitting: Oncology

## 2024-08-23 VITALS — BP 116/69 | HR 86 | Temp 96.9°F | Resp 20

## 2024-08-23 DIAGNOSIS — C4442 Squamous cell carcinoma of skin of scalp and neck: Secondary | ICD-10-CM

## 2024-08-23 DIAGNOSIS — Z95828 Presence of other vascular implants and grafts: Secondary | ICD-10-CM

## 2024-08-23 DIAGNOSIS — Z5111 Encounter for antineoplastic chemotherapy: Secondary | ICD-10-CM | POA: Diagnosis not present

## 2024-08-23 MED ORDER — PEGFILGRASTIM-CBQV 6 MG/0.6ML ~~LOC~~ SOSY
6.0000 mg | PREFILLED_SYRINGE | Freq: Once | SUBCUTANEOUS | Status: AC
  Administered 2024-08-23: 6 mg via SUBCUTANEOUS
  Filled 2024-08-23: qty 0.6

## 2024-08-23 NOTE — Patient Instructions (Signed)
 [facility] [place] - A DEPT OF [facility]  Discharge Instructions: Thank you for choosing [facility] to provide your oncology and hematology care.  If you have a lab appointment with the Cancer Center - please note that after April 8th, 2024, all labs will be drawn in the cancer center.  You do not have to check in or register with the main entrance as you have in the past but will complete your check-in in the cancer center.  Wear comfortable clothing and clothing appropriate for easy access to any Portacath or PICC line.   We strive to give you quality time with your provider. You may need to reschedule your appointment if you arrive late (15 or more minutes).  Arriving late affects you and other patients whose appointments are after yours.  Also, if you miss three or more appointments without notifying the office, you may be dismissed from the clinic at the provider's discretion.      For prescription refill requests, have your pharmacy contact our office and allow 72 hours for refills to be completed.    Today you received the following Udencya, return as scheduled.   To help prevent nausea and vomiting after your treatment, we encourage you to take your nausea medication as directed.  BELOW ARE SYMPTOMS THAT SHOULD BE REPORTED IMMEDIATELY: *FEVER GREATER THAN 100.4 F (38 C) OR HIGHER *CHILLS OR SWEATING *NAUSEA AND VOMITING THAT IS NOT CONTROLLED WITH YOUR NAUSEA MEDICATION *UNUSUAL SHORTNESS OF BREATH *UNUSUAL BRUISING OR BLEEDING *URINARY PROBLEMS (pain or burning when urinating, or frequent urination) *BOWEL PROBLEMS (unusual diarrhea, constipation, pain near the anus) TENDERNESS IN MOUTH AND THROAT WITH OR WITHOUT PRESENCE OF ULCERS (sore throat, sores in mouth, or a toothache) UNUSUAL RASH, SWELLING OR PAIN  UNUSUAL VAGINAL DISCHARGE OR ITCHING   Items with * indicate a potential emergency and should be followed up as soon as possible or  go to the Emergency Department if any problems should occur.  Please show the CHEMOTHERAPY ALERT CARD or IMMUNOTHERAPY ALERT CARD at check-in to the Emergency Department and triage nurse.  Should you have questions after your visit or need to cancel or reschedule your appointment, please contact [facility] [place] - A DEPT OF [facility] [phone]  and follow the prompts.  Office hours are 8:00 a.m. to 4:30 p.m. Monday - Friday. Please note that voicemails left after 4:00 p.m. may not be returned until the following business day.  We are closed weekends and major holidays. You have access to a nurse at all times for urgent questions. Please call the main number to the clinic [phone] and follow the prompts.  For any non-urgent questions, you may also contact your provider using MyChart. We now offer e-Visits for anyone 63 and older to request care online for non-urgent symptoms. For details visit [email or web address].   Also download the MyChart app! Go to the app store, search MyChart, open the app, select [place], and log in with your MyChart username and password.

## 2024-08-23 NOTE — Progress Notes (Signed)
 Patient tolerated injection with no complaints voiced. Site clean and dry with no bruising or swelling noted at site. See MAR for details. Band aid applied.  Patient stable during and after injection. VSS with discharge and left in satisfactory condition with no s/s of distress noted.

## 2024-08-25 NOTE — Progress Notes (Signed)
 Head and Neck Cancer Location of Tumor / Histology:  Squamous Cell Carcinoma of Head and Neck    08/16/2024 CT Chest Abdomen Pelvis W Contrast   Nutrition Status Yes No Comments  Weight changes? [x]  []  Lost 30 pounds in the last 3 montjs  Swallowing concerns? [x]  []    PEG? []  [x]     Referrals Yes No Comments  Social Work? [x]  []    Dentistry? [x]  []    Swallowing therapy? [x]  []    Nutrition? [x]  []    Med/Onc? [x]  []     Safety Issues Yes No Comments  Prior radiation? []  [x]    Pacemaker/ICD? []  [x]    Possible current pregnancy? []  [x]    Is the patient on methotrexate? []  [x]     Tobacco/Marijuana/Snuff/ETOH use:  Stopped smoking since 1965.   Past/Anticipated interventions by otolaryngology, if any:   Past/Anticipated interventions by medical oncology, if any:      Current Complaints / other details:   Shortness of breath while walking.

## 2024-08-28 NOTE — Progress Notes (Signed)
 Radiation Oncology         ([phone]) [phone] ________________________________  Initial outpatient Consultation  Name: [name] MRN: [id number]  Date: 08/29/2024  DOB: 1932-02-27  RR:Izuupwhzm, [name], MD  Davonna Siad, MD   REFERRING PHYSICIAN: Davonna Siad, MD  DIAGNOSIS: No diagnosis found.  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 male who presented with a neck mass. Patient was diagnosed with a large lymph node mass in the right supraclavicular area compatible with metastatic disease in August of 2023 s/p chemotherapy and right chest supraclavicular lymph node excision.      Patient is followed by regular CT scans. A CT scan performed on 08/03/24 showed a new mild right axillary and right subcarinal lymphadenopathy. Right paratracheal adenopathy is mildly increased. Stable right hilar adenopathy. Stable peripheral right upper lobe pulmonary nodule. No evidence of metastatic disease in the abdomen or pelvis was indicated.   Subsequently, on 08/04/2023: he underwent a right axillary lymph node biopsy. Surgical pathology indicating metastatic moderate to poorly differentiated squamous cell carcinoma. As a result chemotherapy with Cetuximab  was initiated on 10/25/2023 every 2 weeks/ however treatment was discontinued due to progression 05/08/2024.  A repeat chest CT performed on 05/02/2024 showed progressive right axillary nodal metastasis. Additional thoracic nodal metastases, stable versus mildly  improved. CT soft tissue neck performed on the same day showed further slight enlargement of lymph nodes in the right level 3/level 4 region and in the lower level 3 region. Stable appearance of lymph nodes on the left. Question of vocal fold paresis on the right, possibly with a small mass or polyp along the under surface of the right vocal fold.  Adjuvant chemotherapy with Single agent carboplatin  (AUC 5) every 21 days was initiated on 05/08/2024. Patient is currently on this regimen.   Most recent CT CAP done on 08/16/2024 showed a new nodule of the medial anterior right upper lobe measuring 1.1 x 0.9 cm, highly suspicious for a metastasis.Unchanged bulky right axillary and right hilar lymphadenopathy. Slightly diminished size of enlarged pretracheal lymph nodes.Unchanged mild bibasilar predominant pulmonary fibrosis. No evidence of lymphadenopathy or metastatic disease in the abdomen or pelvis.    Most recent follow up with Dr. Davonna on 08/22/24 they opted to proceed with radiation therapy to his new lesions lesion that is increasing in size and continuing on current adjuvant chemotherapy treatment.    Swallowing issues, if any: ***  Weight Changes: ***  Pain status: ***  Other symptoms: ***  Tobacco history, if any: He started smoking about 65 years ago. He has a 10 pack-year smoking history. Reports quitting about 60 years ago.  ETOH abuse, if any: none   Prior cancers, if any: ***  PREVIOUS RADIATION THERAPY: No  PAST MEDICAL HISTORY:  has a past medical history of Anemia, Atrial fibrillation (HCC), Cataract, Cellulitis, Complication of anesthesia, Coronary artery disease, DJD (degenerative joint disease), Ejection fraction < 50%, GERD (gastroesophageal reflux disease), Hyperlipidemia, Hypertension, Persistent atrial fibrillation (HCC), Prostate hypertrophy, PUD (peptic ulcer disease), and Skin cancer of eyelid.  PAST SURGICAL HISTORY: Past Surgical History:  Procedure  Laterality Date   APPENDECTOMY     BYPASS GRAFT  2005   CATARACT EXTRACTION W/PHACO Left 07/22/2015   Procedure: CATARACT EXTRACTION PHACO AND INTRAOCULAR LENS PLACEMENT LEFT EYE CDE=8.14;  Surgeon: Cherene Mania, MD;  Location: [facility];  Service: Ophthalmology;  Laterality: Left;   CATARACT EXTRACTION W/PHACO Right 08/18/2019   Procedure: CATARACT EXTRACTION PHACO AND INTRAOCULAR LENS PLACEMENT (IOC);  Surgeon: Harrie Agent, MD;  Location: [facility];  Service: Ophthalmology;  Laterality: Right;  CDE: 9.74   CORONARY ARTERY BYPASS GRAFT  4/05   LIMA to LAD, SVG to PDA, SVG to ramus intermediate   EYE SURGERY  07/2015   EYELID CARCINOMA EXCISION     Skin cancer resected   Heart bypass  2005   LYMPH NODE BIOPSY Right 06/09/2022   Procedure: LYMPH NODE BIOPSY; supraclavicular;  Surgeon: Kallie Manuelita BROCKS, MD;  Location: [facility];  Service: General;  Laterality: Right;   PORTACATH PLACEMENT Left 06/09/2022   Procedure: INSERTION PORT-A-CATH;  Surgeon: Kallie Manuelita BROCKS, MD;  Location: [facility];  Service: General;  Laterality: Left;   TOTAL HIP ARTHROPLASTY     Right   TOTAL HIP ARTHROPLASTY Left 05/04/2018   Procedure: LEFT TOTAL HIP ARTHROPLASTY;  Surgeon: Heide Ingle, MD;  Location: [facility];  Service: Orthopedics;  Laterality: Left;   TOTAL KNEE ARTHROPLASTY     Right    FAMILY HISTORY: family history includes Cancer in his brother; Early death in his brother; Hypertension in his father; Stroke in his brother, father, and mother.  SOCIAL HISTORY:  reports that he quit smoking about 60 years ago. His smoking use included cigarettes. He started smoking about 65 years ago. He has a 10 pack-year smoking history. He has never used smokeless tobacco. He reports that he does not drink alcohol  and does not use drugs.  ALLERGIES: Penicillins, Cetuximab , Hct [hydrochlorothiazide], and Statins  MEDICATIONS:  Current Outpatient Medications  Medication Sig Dispense Refill   acetaminophen  (TYLENOL ) 500 MG  tablet Take 1,000 mg by mouth every 6 (six) hours as needed for moderate pain.     apixaban  (ELIQUIS ) 5 MG TABS tablet Take 1 tablet (5 mg total) by mouth 2 (two) times daily. 60 tablet 5   Cholecalciferol (VITAMIN D3) 5000 units CAPS Take 5,000 Units by mouth once a week.      clindamycin  (CLINDAGEL) 1 % gel Apply 1 Application topically daily at 6 (six) AM.     digoxin  (LANOXIN ) 0.125 MG tablet TAKE ONE (1) TABLET BY MOUTH EVERY DAY 30 tablet 5   ezetimibe  (ZETIA ) 10 MG tablet TAKE ONE (1) TABLET BY MOUTH EVERY DAY 90 tablet 0   finasteride  (PROSCAR ) 5 MG tablet TAKE ONE (1) TABLET EACH DAY 90 tablet 3   fluocinonide  cream (LIDEX ) 0.05 % Apply 1 Application topically 2 (two) times daily. 60 g 3   fluticasone  (FLONASE ) 50 MCG/ACT nasal spray Place 1 spray into both nostrils daily as needed for allergies or rhinitis.     furosemide  (LASIX ) 20 MG tablet Take 1 tablet (20 mg total) by mouth daily as needed (shortness of breathe). 30 tablet 1   lovastatin  (MEVACOR ) 40 MG tablet Take 1 tablet (40 mg total) by mouth at bedtime. 90 tablet 3   magnesium  oxide (MAG-OX) 400 (240 Mg) MG tablet Take 1 tablet (400 mg total) by mouth 2 (two) times daily. 60 tablet 3   methylphenidate  (RITALIN ) 5 MG tablet TAKE 1 TABLET BY MOUTH DAILY  AS NEEDED 30 tablet 0   metoprolol  succinate (TOPROL -XL) 100 MG 24 hr tablet Take 1 tablet (100 mg total) by mouth daily. Take with or immediately following a meal. 90 tablet 3   montelukast  (SINGULAIR ) 10 MG tablet Take 1 tablet (10 mg total) by mouth at bedtime. 90 tablet 3   prochlorperazine  (COMPAZINE ) 10 MG tablet Take 1 tablet (10 mg total) by mouth every 6 (six) hours as needed for nausea or vomiting. 30 tablet 2   tobramycin  (TOBREX ) 0.3 % ophthalmic solution Place 1 drop into both eyes in the morning, at noon, in the evening, and at bedtime.     trimethoprim-polymyxin b  (POLYTRIM) ophthalmic solution SMARTSIG:In Eye(s)     No current facility-administered medications for  this visit.    REVIEW OF SYSTEMS:  Notable for that above.   PHYSICAL EXAM:  vitals were not taken for this visit.   General: Alert and oriented, in no acute distress HEENT: Head is normocephalic. Extraocular movements are intact. Oropharynx is notable for ***. Neck: Neck is notable for *** Heart: Regular in rate and rhythm with no murmurs, rubs, or gallops. Chest: Clear to auscultation bilaterally, with no rhonchi, wheezes, or rales. Abdomen: Soft, nontender, nondistended, with no rigidity or guarding. Extremities: No cyanosis or edema. Lymphatics: see Neck Exam Skin: No concerning lesions. Musculoskeletal: symmetric strength and muscle tone throughout. Neurologic: Cranial nerves II through XII are grossly intact. No obvious focalities. Speech is fluent. Coordination is intact. Psychiatric: Judgment and insight are intact. Affect is appropriate.   ECOG = ***  0 - Asymptomatic (Fully active, able to carry on all predisease activities without restriction)  1 - Symptomatic but completely ambulatory (Restricted in physically strenuous activity but ambulatory and able to carry out work of a light or sedentary nature. For example, light housework, office work)  2 - Symptomatic, <50% in bed during the day (Ambulatory and capable of all self care but unable to carry out any work activities. Up and about more than 50% of waking hours)  3 - Symptomatic, >50% in bed, but not bedbound (Capable of only limited self-care, confined to bed or chair 50% or more of waking hours)  4 - Bedbound (Completely disabled. Cannot carry on any self-care. Totally confined to bed or chair)  5 - Death   Raylene MM, Creech RH, Tormey DC, et al. [phone]). Toxicity and response criteria of the [facility]. Am. DOROTHA Bridges. Oncol. 5 (6): 649-55   LABORATORY DATA:  Lab Results  Component Value Date   WBC 12.1 (H) 08/22/2024   HGB 10.0 (L) 08/22/2024   HCT 31.3 (L) 08/22/2024   MCV 112.6 (H)  08/22/2024   PLT 288 08/22/2024   CMP     Component Value Date/Time   NA 142 08/22/2024 0831   NA 144 07/21/2024 1042   K 4.0 08/22/2024 0831   CL 105 08/22/2024 0831   CO2 28 08/22/2024 0831   GLUCOSE 159 (H) 08/22/2024 0831   BUN 15 08/22/2024 0831   BUN 22 07/21/2024 1042   CREATININE 1.06 08/22/2024 0831   CREATININE 1.10 05/08/2013 0938   CALCIUM  8.8 (L) 08/22/2024 0831   PROT 6.3 (L) 08/22/2024 0831   PROT 5.8 (L) 07/17/2024 1635   ALBUMIN 3.8 08/22/2024 0831   ALBUMIN 3.7 07/17/2024 1635   AST 22 08/22/2024 0831   ALT 12 08/22/2024 0831   ALKPHOS 102 08/22/2024 0831   BILITOT 0.3 08/22/2024 0831   BILITOT 0.5 07/17/2024 1635   GFRNONAA >  60 08/22/2024 0831   GFRNONAA 64 05/08/2013 0938   GFRAA 69 11/27/2020 0852   GFRAA 74 05/08/2013 0938      Lab Results  Component Value Date   TSH 1.510 07/17/2024     RADIOGRAPHY: CT CHEST ABDOMEN PELVIS W CONTRAST Result Date: 08/21/2024 CLINICAL DATA:  Metastatic squamous cell carcinoma, 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 RADIATION DOSE REDUCTION: This exam was performed according to the departmental dose-optimization program which includes automated exposure control, adjustment of the mA and/or kV according to patient size and/or use of iterative reconstruction technique. CONTRAST:  OMNIPAQUE  IOHEXOL  300 MG/ML  SOLN COMPARISON:  CT chest, 05/02/2024, CT abdomen pelvis, 06/01/2024 FINDINGS: CT CHEST FINDINGS Cardiovascular: Left chest port catheter. Aortic atherosclerosis. Cardiomegaly. Three-vessel coronary artery calcifications status post median sternotomy and CABG. No pericardial effusion. Mediastinum/Nodes: Unchanged bulky right axillary lymph nodes measuring up 2 3.8 x 2.7 cm (series 3,  image 26). Unchanged enlarged right hilar lymph nodes measuring up to 2.5 x 1.8 cm (series 3, image 36). Slightly diminished size of enlarged pretracheal lymph nodes measuring up to 1.8 x 1.7 cm, previously up to 2.6 x 2.4 cm (series 3, image 22). Thyroid  gland, trachea, and esophagus demonstrate no significant findings. Lungs/Pleura: New nodule of the medial anterior right upper lobe measuring 1.1 x 0.9 cm (series 8, image 61). Unchanged small nodule of the posterior left upper lobe measuring 0.3 cm (series 8, image 56). Unchanged mild bibasilar predominant pulmonary fibrosis featuring irregular peripheral interstitial opacity, septal thickening, minimal subpleural bronchiolectasis. No pleural effusion or pneumothorax. Musculoskeletal: No chest wall abnormality. No acute osseous findings. CT ABDOMEN PELVIS FINDINGS Hepatobiliary: No solid liver abnormality is seen. Coarse, nodular contour of the liver. Gallstones. No gallbladder wall thickening. No biliary ductal dilatation. Pancreas: Unremarkable. No pancreatic ductal dilatation or surrounding inflammatory changes. Spleen: Mild splenomegaly, maximum coronal span 13.5 cm. Adrenals/Urinary Tract: Adrenal glands are unremarkable. Simple, benign bilateral renal cortical cysts, for which no further follow-up or characterization is required kidneys are otherwise normal, without renal calculi, solid lesion, or hydronephrosis. Bladder is unremarkable. Stomach/Bowel: Stomach is within normal limits. Appendix not clearly visualized and may be surgically absent. No evidence of bowel wall thickening, distention, or inflammatory changes. Sigmoid diverticulosis. Vascular/Lymphatic: Severe aortic atherosclerosis. No enlarged abdominal or pelvic lymph nodes. Reproductive: Evaluation very limited by dense metallic streak artifact from bilateral hip arthroplasty. Other: No abdominal wall hernia or abnormality. No ascites. Musculoskeletal: No acute osseous findings. Status post  bilateral hip total arthroplasty. IMPRESSION: 1. New nodule of the medial anterior right upper lobe measuring 1.1 x 0.9 cm, highly suspicious for a metastasis. 2. Unchanged bulky right axillary and right hilar lymphadenopathy. Slightly diminished size of enlarged pretracheal lymph nodes. 3. Unchanged mild bibasilar predominant pulmonary fibrosis. 4. No evidence of lymphadenopathy or metastatic disease in the abdomen or pelvis. 5. Cirrhosis. Mild splenomegaly. 6. Cholelithiasis. 7. Coronary artery disease. 8. Aortic atherosclerosis. Aortic Atherosclerosis (ICD10-I70.0). Electronically Signed   By: Marolyn JONETTA Jaksch M.D.   On: 08/21/2024 13:29      IMPRESSION/PLAN:  This is a delightful patient with head and neck cancer. I *** recommend radiotherapy for this patient.  We discussed the potential risks, benefits, and side effects of radiotherapy. We talked in detail about acute and late effects. We discussed that some of  the most bothersome acute effects may be mucositis, dysgeusia, salivary changes, skin irritation, hair loss, dehydration, weight loss and fatigue. We talked about late effects which include but are not necessarily limited to dysphagia, hypothyroidism, nerve injury, vascular injury, spinal cord injury, xerostomia, trismus, neck edema, dental issues, non-healing wound, and potentially fatal injury to any of the tissues in the head and neck region. No guarantees of treatment were given. A consent form was signed and placed in the patient's medical record. The patient is enthusiastic about proceeding with treatment. I look forward to participating in the patient's care.    Simulation (treatment planning) will take place ***  We also discussed that the treatment of head and neck cancer is a multidisciplinary process to maximize treatment outcomes and quality of life. For this reason the following referrals have been or will be made:  *** Medical oncology to discuss chemotherapy   *** Dentistry for  dental evaluation, possible extractions in the radiation fields, and /or advice on reducing risk of cavities, osteoradionecrosis, or other oral issues.  *** Nutritionist for nutrition support during and after treatment.  *** Speech language pathology for swallowing and/or speech therapy.  *** Social work for social support.   *** Physical therapy due to risk of lymphedema in neck and deconditioning.  *** Baseline labs including TSH.  On date of service, in total, I spent *** minutes on this encounter. Patient was seen in person.  __________________________________________   Lauraine Golden, MD

## 2024-08-29 ENCOUNTER — Ambulatory Visit
Admission: RE | Admit: 2024-08-29 | Discharge: 2024-08-29 | Disposition: A | Discharge status: Home / Self Care | Source: Ambulatory Visit | Attending: Radiation Oncology | Admitting: Radiation Oncology

## 2024-08-29 ENCOUNTER — Encounter: Payer: Self-pay | Admitting: Radiation Oncology

## 2024-08-29 VITALS — BP 137/79 | HR 79 | Temp 97.5°F | Resp 18 | Ht 66.0 in | Wt 168.2 lb

## 2024-08-29 DIAGNOSIS — C778 Secondary and unspecified malignant neoplasm of lymph nodes of multiple regions: Secondary | ICD-10-CM | POA: Insufficient documentation

## 2024-08-29 DIAGNOSIS — R911 Solitary pulmonary nodule: Secondary | ICD-10-CM | POA: Insufficient documentation

## 2024-08-29 DIAGNOSIS — I2510 Atherosclerotic heart disease of native coronary artery without angina pectoris: Secondary | ICD-10-CM | POA: Diagnosis not present

## 2024-08-29 DIAGNOSIS — I499 Cardiac arrhythmia, unspecified: Secondary | ICD-10-CM | POA: Insufficient documentation

## 2024-08-29 DIAGNOSIS — Z8711 Personal history of peptic ulcer disease: Secondary | ICD-10-CM | POA: Insufficient documentation

## 2024-08-29 DIAGNOSIS — N400 Enlarged prostate without lower urinary tract symptoms: Secondary | ICD-10-CM | POA: Insufficient documentation

## 2024-08-29 DIAGNOSIS — C801 Malignant (primary) neoplasm, unspecified: Secondary | ICD-10-CM | POA: Diagnosis not present

## 2024-08-29 DIAGNOSIS — Z803 Family history of malignant neoplasm of breast: Secondary | ICD-10-CM | POA: Insufficient documentation

## 2024-08-29 DIAGNOSIS — Z806 Family history of leukemia: Secondary | ICD-10-CM | POA: Diagnosis not present

## 2024-08-29 DIAGNOSIS — C4442 Squamous cell carcinoma of skin of scalp and neck: Secondary | ICD-10-CM

## 2024-08-29 DIAGNOSIS — I700 Atherosclerosis of aorta: Secondary | ICD-10-CM | POA: Insufficient documentation

## 2024-08-29 DIAGNOSIS — K8020 Calculus of gallbladder without cholecystitis without obstruction: Secondary | ICD-10-CM | POA: Diagnosis not present

## 2024-08-29 DIAGNOSIS — Z85828 Personal history of other malignant neoplasm of skin: Secondary | ICD-10-CM | POA: Diagnosis not present

## 2024-08-29 DIAGNOSIS — I119 Hypertensive heart disease without heart failure: Secondary | ICD-10-CM | POA: Diagnosis not present

## 2024-08-29 DIAGNOSIS — Z801 Family history of malignant neoplasm of trachea, bronchus and lung: Secondary | ICD-10-CM | POA: Insufficient documentation

## 2024-08-29 DIAGNOSIS — R011 Cardiac murmur, unspecified: Secondary | ICD-10-CM | POA: Insufficient documentation

## 2024-08-29 DIAGNOSIS — Z87891 Personal history of nicotine dependence: Secondary | ICD-10-CM | POA: Insufficient documentation

## 2024-08-29 DIAGNOSIS — M1990 Unspecified osteoarthritis, unspecified site: Secondary | ICD-10-CM | POA: Diagnosis not present

## 2024-08-29 DIAGNOSIS — Z7901 Long term (current) use of anticoagulants: Secondary | ICD-10-CM | POA: Insufficient documentation

## 2024-08-29 DIAGNOSIS — E785 Hyperlipidemia, unspecified: Secondary | ICD-10-CM | POA: Diagnosis not present

## 2024-08-29 DIAGNOSIS — I4819 Other persistent atrial fibrillation: Secondary | ICD-10-CM | POA: Diagnosis not present

## 2024-08-29 DIAGNOSIS — Z79899 Other long term (current) drug therapy: Secondary | ICD-10-CM | POA: Diagnosis not present

## 2024-08-29 DIAGNOSIS — R161 Splenomegaly, not elsewhere classified: Secondary | ICD-10-CM | POA: Insufficient documentation

## 2024-08-29 DIAGNOSIS — C7801 Secondary malignant neoplasm of right lung: Secondary | ICD-10-CM

## 2024-08-30 ENCOUNTER — Telehealth: Payer: Self-pay | Admitting: Family Medicine

## 2024-08-30 DIAGNOSIS — C7801 Secondary malignant neoplasm of right lung: Secondary | ICD-10-CM | POA: Insufficient documentation

## 2024-08-30 NOTE — Telephone Encounter (Signed)
 Contacted Chris PT from [place].  I gave verbal orders for [facility] PT 1week8 per office protocol.

## 2024-08-30 NOTE — Telephone Encounter (Signed)
 Copied from CRM #[id number]. Topic: Clinical - Home Health Verbal Orders >> Aug 30, 2024  4:41 PM Everette C wrote: Caller/Agency: [facility] / [facility]  Callback Number: [phone] Service Requested: Physical Therapy Frequency: 1w8  Any new concerns about the patient? No

## 2024-09-01 ENCOUNTER — Ambulatory Visit (HOSPITAL_COMMUNITY)
Admission: RE | Admit: 2024-09-01 | Discharge: 2024-09-01 | Disposition: A | Discharge status: Home / Self Care | Source: Ambulatory Visit | Attending: Oncology | Admitting: Oncology

## 2024-09-01 DIAGNOSIS — C4492 Squamous cell carcinoma of skin, unspecified: Secondary | ICD-10-CM

## 2024-09-01 DIAGNOSIS — C4442 Squamous cell carcinoma of skin of scalp and neck: Secondary | ICD-10-CM | POA: Insufficient documentation

## 2024-09-01 MED ORDER — IOHEXOL 300 MG/ML  SOLN
75.0000 mL | Freq: Once | INTRAMUSCULAR | Status: AC | PRN
  Administered 2024-09-01: 75 mL via INTRAVENOUS

## 2024-09-04 ENCOUNTER — Encounter: Payer: Self-pay | Admitting: Family Medicine

## 2024-09-04 ENCOUNTER — Ambulatory Visit: Payer: Self-pay | Admitting: Family Medicine

## 2024-09-04 VITALS — BP 179/77 | HR 99 | Ht 66.0 in | Wt 173.0 lb

## 2024-09-04 DIAGNOSIS — E785 Hyperlipidemia, unspecified: Secondary | ICD-10-CM

## 2024-09-04 DIAGNOSIS — Z7901 Long term (current) use of anticoagulants: Secondary | ICD-10-CM

## 2024-09-04 DIAGNOSIS — I10 Essential (primary) hypertension: Secondary | ICD-10-CM | POA: Diagnosis not present

## 2024-09-04 DIAGNOSIS — R7303 Prediabetes: Secondary | ICD-10-CM | POA: Diagnosis not present

## 2024-09-04 DIAGNOSIS — I4819 Other persistent atrial fibrillation: Secondary | ICD-10-CM | POA: Diagnosis not present

## 2024-09-04 DIAGNOSIS — R972 Elevated prostate specific antigen [PSA]: Secondary | ICD-10-CM

## 2024-09-04 DIAGNOSIS — N400 Enlarged prostate without lower urinary tract symptoms: Secondary | ICD-10-CM

## 2024-09-04 LAB — CBC WITH DIFFERENTIAL/PLATELET

## 2024-09-04 LAB — COAGUCHEK XS/INR WAIVED
INR: 1.6 — ABNORMAL HIGH (ref 0.9–1.1)
Prothrombin Time: 39 s

## 2024-09-04 NOTE — Progress Notes (Signed)
 BP (!) 179/77   Pulse 99   Ht 5' 6 (1.676 m)   Wt 173 lb (78.5 kg)   SpO2 97%   BMI 27.92 kg/m    Subjective:   Patient ID: [name], male    DOB: [birth year], 89 y.o.   MRN: [id number]  HPI: [name] is a 89 y.o. male presenting on 09/04/2024 for Medical Management of Chronic Issues, Atrial Fibrillation, Hyperlipidemia, and Hypertension   Discussed the use of AI scribe software for clinical note transcription with the patient, who gave verbal consent to proceed.  History of Present Illness   [name] is a 89 year old male with a history of cancer who presents for follow-up of his cancer treatment and medication management.  Malignancy and oncologic treatment - Currently undergoing chemotherapy for cancer - Scheduled to begin radiation therapy on December 15th for a small pulmonary lesion - Received a post-chemotherapy injection to support blood counts after each session - Weight gain attributed to chemotherapy, following prior weight loss during a recent 11-day hospitalization - Accepting of current weight gain due to previous weight loss - Doing well with current oncologic regimen and prepared for upcoming radiation  Anemia - Anemia has become more pronounced recently - Monitored closely during ongoing cancer treatment  Anticoagulation management - Previously on warfarin, switched to apixaban  (Eliquis ) during recent hospitalization by cardiology - Stable on current apixaban  dose - No longer requires INR monitoring, which is a relief - Aware of potential cost changes for apixaban  at the start of the new year  Prostatic symptoms - Longstanding lower urinary tract symptoms for 25 to 30 years - Currently managed with medication for prostatic symptoms - Resisted discontinuation of prostatic medication due to perceived benefit  Allergic rhinitis - Managed with montelukast  (Singulair ) - Takes montelukast  at bedtime to minimize daytime somnolence           Relevant past medical, surgical, family and social history reviewed and updated as indicated. Interim medical history since our last visit reviewed. Allergies and medications reviewed and updated.  Review of Systems  Constitutional:  Negative for chills and fever.  Eyes:  Negative for discharge and visual disturbance.  Respiratory:  Negative for shortness of breath and wheezing.   Cardiovascular:  Negative for chest pain and leg swelling.  Musculoskeletal:  Negative for back pain and gait problem.  Skin:  Negative for rash.  Neurological:  Positive for light-headedness (Occasional sense of quick). Negative for dizziness.  All other systems reviewed and are negative.   Per HPI unless specifically indicated above   Allergies as of 09/04/2024       Reactions   Penicillins Hives, Rash   Has patient had a PCN reaction causing immediate rash, facial/tongue/throat swelling, SOB or lightheadedness with hypotension: Yes Has patient had a PCN reaction causing severe rash involving mucus membranes or skin necrosis: Yes Has patient had a PCN reaction that required hospitalization: No Has patient had a PCN reaction occurring within the last 10 years: No If all of the above answers are NO, then may proceed with Cephalosporin use.   Cetuximab  Cough   Cough and scratchy throat. Drug rechallenged and pt able to tolerated the rest of the infusion without any complications.    Hct [hydrochlorothiazide] Other (See Comments)   hyper   Statins Other (See Comments)   Myalgia, tolerates low dosages of lovastatin          Medication List  Accurate as of September 04, 2024 10:40 AM. If you have any questions, ask your nurse or doctor.          acetaminophen  500 MG tablet Commonly known as: TYLENOL  Take 1,000 mg by mouth every 6 (six) hours as needed for moderate pain.   apixaban  5 MG Tabs tablet Commonly known as: ELIQUIS  Take 1 tablet (5 mg total) by mouth 2 (two) times  daily.   clindamycin  1 % gel Commonly known as: CLINDAGEL Apply 1 Application topically daily at 6 (six) AM.   digoxin  0.125 MG tablet Commonly known as: LANOXIN  TAKE ONE (1) TABLET BY MOUTH EVERY DAY   ezetimibe  10 MG tablet Commonly known as: ZETIA  TAKE ONE (1) TABLET BY MOUTH EVERY DAY   finasteride  5 MG tablet Commonly known as: PROSCAR  TAKE ONE (1) TABLET EACH DAY   fluocinonide  cream 0.05 % Commonly known as: LIDEX  Apply 1 Application topically 2 (two) times daily.   fluticasone  50 MCG/ACT nasal spray Commonly known as: FLONASE  Place 1 spray into both nostrils daily as needed for allergies or rhinitis.   furosemide  20 MG tablet Commonly known as: LASIX  Take 1 tablet (20 mg total) by mouth daily as needed (shortness of breathe).   lovastatin  40 MG tablet Commonly known as: MEVACOR  Take 1 tablet (40 mg total) by mouth at bedtime.   magnesium  oxide 400 (240 Mg) MG tablet Commonly known as: MAG-OX Take 1 tablet (400 mg total) by mouth 2 (two) times daily.   methylphenidate  5 MG tablet Commonly known as: RITALIN  TAKE 1 TABLET BY MOUTH DAILY AS NEEDED   metoprolol  succinate 100 MG 24 hr tablet Commonly known as: TOPROL -XL Take 1 tablet (100 mg total) by mouth daily. Take with or immediately following a meal.   montelukast  10 MG tablet Commonly known as: SINGULAIR  Take 1 tablet (10 mg total) by mouth at bedtime.   prochlorperazine  10 MG tablet Commonly known as: COMPAZINE  Take 1 tablet (10 mg total) by mouth every 6 (six) hours as needed for nausea or vomiting.   tobramycin  0.3 % ophthalmic solution Commonly known as: TOBREX  Place 1 drop into both eyes in the morning, at noon, in the evening, and at bedtime.   trimethoprim-polymyxin b  ophthalmic solution Commonly known as: POLYTRIM SMARTSIG:In Eye(s)   Vitamin D3 125 MCG (5000 UT) Caps Take 5,000 Units by mouth once a week.         Objective:   BP (!) 179/77   Pulse 99   Ht 5' 6 (1.676 m)    Wt 173 lb (78.5 kg)   SpO2 97%   BMI 27.92 kg/m   Wt Readings from Last 3 Encounters:  09/04/24 173 lb (78.5 kg)  08/29/24 168 lb 4 oz (76.3 kg)  08/22/24 168 lb 4.8 oz (76.3 kg)    Physical Exam Vitals and nursing note reviewed.    Physical Exam   CHEST: Lungs clear to auscultation. CARDIOVASCULAR: Systolic murmur present. Heart rate regular. EXTREMITIES: No swelling in legs.         Assessment & Plan:   Problem List Items Addressed This Visit       Cardiovascular and Mediastinum   Essential hypertension   Persistent atrial fibrillation (HCC)     Genitourinary   [name] with elevated PSA     Other   Dyslipidemia   Relevant Orders   Lipid panel   Prediabetes   Long term current use of anticoagulant therapy   Other Visit Diagnoses  Atrial fibrillation, persistent (HCC)    -  Primary   Relevant Orders   CBC with Differential/Platelet   CoaguChek XS/INR Waived          Lung cancer currently treated with chemotherapy and planned radiation Lung cancer with a small spot on the lung identified, planned for radiation therapy. Currently undergoing chemotherapy with a new regimen due to previous adverse effects. - Continue current chemotherapy regimen. - Initiate radiation therapy next week. - Ensure adequate hydration.  Anemia secondary to chemotherapy Anemia likely secondary to chemotherapy. Blood counts are being monitored closely. - Continue monitoring blood counts.  Persistent atrial fibrillation managed with direct oral anticoagulant (Eliquis ) Persistent atrial fibrillation managed with Eliquis . Transitioned from warfarin to Eliquis  due to its stability and ease of use. Discussed potential cost increase at the beginning of the new year due to deductible reset. - Continue Eliquis  at current dose. - Monitor for potential cost increase at the beginning of the new year.  Benign prostatic hyperplasia Long-standing benign prostatic hyperplasia with fluctuating  symptoms. - Continue current prostate medication.      Blood pressure better at home in the 130s over 60s, plus having some lightheadedness and dizziness, so we will not adjust any medicines for that.  Even though the blood pressure is high today we will continue to monitor.    Follow up plan: Return in about 6 months (around 03/04/2025), or if symptoms worsen or fail to improve, for htn and hld .  Counseling provided for all of the vaccine components Orders Placed This Encounter  Procedures   CBC with Differential/Platelet   Lipid panel   CoaguChek XS/INR Waived    Fonda Levins, MD [facility] 09/04/2024, 10:40 AM

## 2024-09-05 ENCOUNTER — Telehealth: Payer: Self-pay

## 2024-09-05 LAB — CBC WITH DIFFERENTIAL/PLATELET
Basos: 0 %
EOS (ABSOLUTE): 0 x10E3/uL (ref 0.0–0.2)
Eos: 0 %
Hematocrit: 27.8 % — AB (ref 37.5–51.0)
Hemoglobin: 9.3 g/dL — AB (ref 13.0–17.7)
Immature Granulocytes: 0.1 x10E3/uL (ref 0.0–0.1)
Immature Granulocytes: 1 %
Lymphs: 27 %
MCH: 36.2 pg — AB (ref 26.6–33.0)
MCHC: 33.5 g/dL (ref 31.5–35.7)
MCV: 108 fL — AB (ref 79–97)
Monocytes Absolute: 0 x10E3/uL (ref 0.0–0.4)
Monocytes Absolute: 0.5 x10E3/uL (ref 0.1–0.9)
Monocytes: 5 %
Neutrophils Absolute: 3.1 x10E3/uL (ref 0.7–3.1)
Neutrophils Absolute: 7.6 x10E3/uL — AB (ref 1.4–7.0)
Neutrophils: 67 %
Platelets: 80 x10E3/uL — AB (ref 150–450)
RBC: 2.57 x10E6/uL — AB (ref 4.14–5.80)
RDW: 14.6 % (ref 11.6–15.4)
WBC: 11.3 x10E3/uL — AB (ref 3.4–10.8)

## 2024-09-05 LAB — LIPID PANEL
Chol/HDL Ratio: 2.9 ratio (ref 0.0–5.0)
Cholesterol, Total: 160 mg/dL (ref 100–199)
HDL: 55 mg/dL (ref >39)
LDL Chol Calc (NIH): 84 mg/dL (ref 0–99)
Triglycerides: 119 mg/dL (ref 0–149)
VLDL Cholesterol Cal: 21 mg/dL (ref 5–40)

## 2024-09-05 NOTE — Telephone Encounter (Signed)
 Copied from CRM #[id number]. Topic: Clinical - Home Health Verbal Orders >> Sep 05, 2024  2:59 PM Sophia H wrote: Caller/Agency: [facility]  Callback Number: [phone] Service Requested: Physical Therapy Frequency: Orders for PT on hold starting on week of 12/14-12/20, patient will be having radiation this week.  Any new concerns about the patient? No

## 2024-09-05 NOTE — Telephone Encounter (Signed)
 Verbal okay given.

## 2024-09-06 ENCOUNTER — Other Ambulatory Visit: Payer: Self-pay

## 2024-09-06 ENCOUNTER — Encounter: Payer: Self-pay | Admitting: Hematology

## 2024-09-06 ENCOUNTER — Ambulatory Visit: Payer: Self-pay | Admitting: Family Medicine

## 2024-09-12 ENCOUNTER — Inpatient Hospital Stay: Attending: Hematology | Admitting: Oncology

## 2024-09-12 ENCOUNTER — Inpatient Hospital Stay: Attending: Hematology

## 2024-09-12 ENCOUNTER — Inpatient Hospital Stay

## 2024-09-12 ENCOUNTER — Encounter: Payer: Self-pay | Admitting: Oncology

## 2024-09-12 VITALS — Temp 98.2°F

## 2024-09-12 VITALS — BP 113/65 | HR 80 | Temp 98.3°F | Resp 19

## 2024-09-12 DIAGNOSIS — Z5189 Encounter for other specified aftercare: Secondary | ICD-10-CM | POA: Insufficient documentation

## 2024-09-12 DIAGNOSIS — Z5111 Encounter for antineoplastic chemotherapy: Secondary | ICD-10-CM | POA: Diagnosis present

## 2024-09-12 DIAGNOSIS — C773 Secondary and unspecified malignant neoplasm of axilla and upper limb lymph nodes: Secondary | ICD-10-CM | POA: Insufficient documentation

## 2024-09-12 DIAGNOSIS — D649 Anemia, unspecified: Secondary | ICD-10-CM | POA: Diagnosis not present

## 2024-09-12 DIAGNOSIS — C9110 Chronic lymphocytic leukemia of B-cell type not having achieved remission: Secondary | ICD-10-CM | POA: Insufficient documentation

## 2024-09-12 DIAGNOSIS — C7801 Secondary malignant neoplasm of right lung: Secondary | ICD-10-CM | POA: Diagnosis not present

## 2024-09-12 DIAGNOSIS — D539 Nutritional anemia, unspecified: Secondary | ICD-10-CM | POA: Insufficient documentation

## 2024-09-12 DIAGNOSIS — Z95828 Presence of other vascular implants and grafts: Secondary | ICD-10-CM

## 2024-09-12 DIAGNOSIS — R530 Neoplastic (malignant) related fatigue: Secondary | ICD-10-CM | POA: Insufficient documentation

## 2024-09-12 DIAGNOSIS — C7989 Secondary malignant neoplasm of other specified sites: Secondary | ICD-10-CM

## 2024-09-12 DIAGNOSIS — C7802 Secondary malignant neoplasm of left lung: Secondary | ICD-10-CM | POA: Diagnosis not present

## 2024-09-12 DIAGNOSIS — C4442 Squamous cell carcinoma of skin of scalp and neck: Secondary | ICD-10-CM

## 2024-09-12 DIAGNOSIS — C801 Malignant (primary) neoplasm, unspecified: Secondary | ICD-10-CM | POA: Insufficient documentation

## 2024-09-12 DIAGNOSIS — C4492 Squamous cell carcinoma of skin, unspecified: Secondary | ICD-10-CM

## 2024-09-12 LAB — CBC WITH DIFFERENTIAL/PLATELET
Abs Immature Granulocytes: 0.08 K/uL — ABNORMAL HIGH (ref 0.00–0.07)
Basophils Absolute: 0 K/uL (ref 0.0–0.1)
Basophils Relative: 0 %
Eosinophils Absolute: 0.1 K/uL (ref 0.0–0.5)
Eosinophils Relative: 1 %
HCT: 30.5 % — ABNORMAL LOW (ref 39.0–52.0)
Hemoglobin: 9.6 g/dL — ABNORMAL LOW (ref 13.0–17.0)
Immature Granulocytes: 1 %
Lymphocytes Relative: 36 %
Lymphs Abs: 3.4 K/uL (ref 0.7–4.0)
MCH: 36.1 pg — ABNORMAL HIGH (ref 26.0–34.0)
MCHC: 31.5 g/dL (ref 30.0–36.0)
MCV: 114.7 fL — ABNORMAL HIGH (ref 80.0–100.0)
Monocytes Absolute: 0.8 K/uL (ref 0.1–1.0)
Monocytes Relative: 9 %
Neutro Abs: 5 K/uL (ref 1.7–7.7)
Neutrophils Relative %: 53 %
Platelets: 216 K/uL (ref 150–400)
RBC: 2.66 MIL/uL — ABNORMAL LOW (ref 4.22–5.81)
RDW: 18.4 % — ABNORMAL HIGH (ref 11.5–15.5)
Smear Review: NORMAL
WBC: 9.4 K/uL (ref 4.0–10.5)
nRBC: 0 % (ref 0.0–0.2)

## 2024-09-12 LAB — COMPREHENSIVE METABOLIC PANEL WITH GFR
ALT: 17 U/L (ref 0–44)
AST: 28 U/L (ref 15–41)
Albumin: 4.1 g/dL (ref 3.5–5.0)
Alkaline Phosphatase: 111 U/L (ref 38–126)
Anion gap: 13 (ref 5–15)
BUN: 16 mg/dL (ref 8–23)
CO2: 25 mmol/L (ref 22–32)
Calcium: 9 mg/dL (ref 8.9–10.3)
Chloride: 105 mmol/L (ref 98–111)
Creatinine, Ser: 1.02 mg/dL (ref 0.61–1.24)
GFR, Estimated: >60 mL/min (ref >60)
Glucose, Bld: 122 mg/dL — ABNORMAL HIGH (ref 70–99)
Potassium: 3.9 mmol/L (ref 3.5–5.1)
Sodium: 143 mmol/L (ref 135–145)
Total Bilirubin: 0.3 mg/dL (ref 0.0–1.2)
Total Protein: 6.5 g/dL (ref 6.5–8.1)

## 2024-09-12 LAB — MAGNESIUM: Magnesium: 2.2 mg/dL (ref 1.7–2.4)

## 2024-09-12 MED ORDER — FAMOTIDINE IN NACL 20-0.9 MG/50ML-% IV SOLN
20.0000 mg | Freq: Once | INTRAVENOUS | Status: AC
  Administered 2024-09-12: 20 mg via INTRAVENOUS
  Filled 2024-09-12: qty 50

## 2024-09-12 MED ORDER — SODIUM CHLORIDE 0.9 % IV SOLN
400.0000 mg | Freq: Once | INTRAVENOUS | Status: AC
  Administered 2024-09-12: 400 mg via INTRAVENOUS
  Filled 2024-09-12: qty 40

## 2024-09-12 MED ORDER — SODIUM CHLORIDE 0.9 % IV SOLN: INTRAVENOUS | Status: DC

## 2024-09-12 MED ORDER — SODIUM CHLORIDE 0.9 % IV SOLN
150.0000 mg | Freq: Once | INTRAVENOUS | Status: AC
  Administered 2024-09-12: 150 mg via INTRAVENOUS
  Filled 2024-09-12: qty 5

## 2024-09-12 MED ORDER — CETIRIZINE HCL 10 MG/ML IV SOLN
10.0000 mg | Freq: Once | INTRAVENOUS | Status: AC
  Administered 2024-09-12: 10 mg via INTRAVENOUS
  Filled 2024-09-12: qty 1

## 2024-09-12 MED ORDER — DEXAMETHASONE SOD PHOSPHATE PF 10 MG/ML IJ SOLN
10.0000 mg | Freq: Once | INTRAMUSCULAR | Status: AC
  Administered 2024-09-12: 10 mg via INTRAVENOUS

## 2024-09-12 MED ORDER — PALONOSETRON HCL INJECTION 0.25 MG/5ML
0.2500 mg | Freq: Once | INTRAVENOUS | Status: AC
  Administered 2024-09-12: 0.25 mg via INTRAVENOUS
  Filled 2024-09-12: qty 5

## 2024-09-12 NOTE — Progress Notes (Signed)
 Patient Care Team: [name], Fonda LABOR, MD as PCP - General (Family Medicine) Greg Agent, MD as PCP - Cardiology (Cardiology) Watt Rush, MD as Attending Physician (Urology) Greg Agent, MD as Consulting Physician (Cardiology) Golda Claudis PENNER, MD (Inactive) as Consulting Physician (Gastroenterology) Perley Hamilton, MD as Consulting Physician (Ophthalmology) Greg Ingle, MD as Consulting Physician (Orthopedic Surgery) Greg Ozell RAMAN, LCSW as [facility] Care Management (Licensed Clinical Social Worker) Greg Joesph SQUIBB, RN as Oncology Nurse Navigator (Medical Oncology)  Clinic Day:  09/12/2024  Referring physician: [name], Fonda LABOR, MD   CHIEF COMPLAINT:  CC:  Metastatic squamous cell carcinoma, 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   Squamous cell carcinoma of head and neck  Patient with metastatic carcinoma of unknown primary likely head and neck origin Patient had metastatic disease in the supraclavicular region with bilateral pulmonary nodules and right axillary lymphadenopathy 1st line: Carboplatin  plus paclitaxel  plus pembrolizumab  (PD-L1 CPS: 25) with progression in right axillary and subcarinal lymphadenopathy Second line: Cetuximab , with progression in right axillary lymphadenopathy Currently on third line: Single [name] carboplatin  every 3 weeks   - We discussed the CT neck findings in detail and that his right level 3 lymph node is growing.  Discussed in detail that this indicates disease progression than the current chemotherapy is not working.  Patient also had a previous right lung lesion that has been growing on the CT scan. - I discussed treatment options at this time.  Patient would like to continue treatments to live longer.  Discussed likely getting radiation therapy to consolidate these lesions  that are increasing in size and continuing on current treatment.  Discussed this with Dr. Izell.  Also discussed changing treatment to capecitabine at this time.  Considering patient has no significant side effects from current chemotherapy, he would like to consider radiation prior to changing treatments. -Patient does understand that he does not have a lot of treatment options available at this time. -Patient tolerating treatment well.   - Labs reviewed today.  CMP: Normal creatinine, normal LFTs, CBC: WBC: 9.4, hemoglobin: 9.6, platelets:216 - Physical exam stable today.  Proceed with treatment today. -Continue carboplatin  every 3 weeks for now  Return to clinic in 9 weeks for follow-up.  CLL (chronic lymphocytic leukemia)  Patient has history of CLL diagnosed in 2023.  RAI stage I No B symptoms  - Does not meet criteria for treatment at this time.  Hemoglobin dropping but likely secondary to chemotherapy at this time - Continue to monitor  Neoplastic malignant related fatigue Patient has cancer-related fatigue and is on Ritalin  5mg  as needed  - Continue Ritalin  5 mg as needed.  Rash Rash resolved.  Secondary to cetuximab   Macrocytic anemia Likely secondary to myelosuppression from chemotherapy or CLL Currently asymptomatic  - Will obtain anemia panel with next blood draw.   The patient understands the plans discussed today and is in agreement with them.  He knows to contact our office if he develops concerns prior to his next appointment.  The total time spent in the appointment was 17  minutes for the encounter with patient, including review of chart and various tests results, discussions about plan of care and coordination of care plan.    LILLETTE Verneta SAUNDERS Teague,acting as a neurosurgeon for [employer], MD.,have documented all relevant documentation on the behalf of Greg Shall  Davonna, MD,as directed by  Greg Davonna, MD while in the presence of Greg Davonna, MD.  I, Greg Davonna MD, have reviewed the above documentation for accuracy and completeness, and I agree with the above.      Greg Davonna, MD  [facility] - A DEPT OF [facility] [address] [place] [address] Dept: [phone] Dept Fax: [phone]   Orders Placed This Encounter  Procedures   CT SOFT TISSUE NECK W CONTRAST    Standing Status:   Future    Expected Date:   10/13/2024    Expiration Date:   09/12/2025    If indicated for the ordered procedure, I authorize the administration of contrast media per Radiology protocol:   Yes    Does the patient have a contrast media/X-ray dye allergy?:   No    Preferred imaging location?:   [facility]   CT CHEST ABDOMEN PELVIS W CONTRAST    Standing Status:   Future    Expected Date:   11/13/2024    Expiration Date:   09/12/2025    If indicated for the ordered procedure, I authorize the administration of contrast media per Radiology protocol:   Yes    Does the patient have a contrast media/X-ray dye allergy?:   No    Preferred imaging location?:   [facility]    If indicated for the ordered procedure, I authorize the administration of oral contrast media per Radiology protocol:   Yes   Ferritin    Standing Status:   Future    Expected Date:   10/03/2024    Expiration Date:   01/01/2025   Folate    Standing Status:   Future    Expected Date:   10/03/2024    Expiration Date:   01/01/2025   Vitamin B12    Standing Status:   Future    Expected Date:   10/03/2024    Expiration Date:   01/01/2025   Iron and TIBC    Standing Status:   Future    Expected Date:   10/03/2024    Expiration Date:   01/01/2025     ONCOLOGY HISTORY:   I have reviewed his chart and materials related to his cancer extensively and collaborated history with the patient. 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 Solitary pathologic lymph node in the left neck also highly suspicious for metastatic disease. Multiple lung nodule in the a lung apices bilaterally compatible with metastatic disease. -05/28/2022: CT CAP: Large nodal mass in the right supraclavicular region with bilateral pulmonary nodules. In addition, slightly enlarged lymph nodes in the right axilla and right sub pectoralis region. Primary neoplastic source may be located in the right supraclavicular region but etiology is uncertain based on imaging. No evidence for metastatic disease in the abdomen or pelvis. Liver has a slightly nodular contour.  -06/08/2022: Brain MRI: No evidence of mass lesion.  -06/09/2022: Right chest supraclavicular lymph node excision  Pathology: Metastatic poorly differentiated carcinoma.T e metastatic carcinoma is positive with p40, p63,  cytokeratin 5/6 and shows patchy positivity with cytokeratin 7.  GATA3  shows focal, weak likely nonspecific staining. The immunophenotype is most consistent with metastatic squamous cell carcinoma.  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 Met amplification, RET rearrangement, PTEN deletion: Not detected [employer] and [employer] gen[employer]: NGS: TP53 pathogenic variant, MSI-stable, TMB-low, LOH-low, PD-L1 (SP142): Negative, p16 and p18 negative.  HER2 by IHC 0.pan-TRK: Not expressed PD-L1 22C3: CPS score 25 Cancer type ID: 96% probability squamous cell carcinoma, subtype head and neck/skin.  Cancer types ruled out with 95% confidence includes skin basal cell carcinoma.  -06/11/2022: Initial PET: Bulky RIGHT supraclavicular nodal mass with hypermetabolic activity. Findings consistent with metastatic adenopathy. Smaller hypermetabolic RIGHT posterior triangle and jugular lymph nodes. Single hypermetabolic LEFT level 3  lymph node. Bilateral hypermetabolic pulmonary nodules in a metastatic pattern. Number of metastatic pulmonary nodules much greater on the RIGHT. Minimal hypermetabolic mediastinal nodal metastasis. Cluster of hypermetabolic RIGHT axillary lymph nodes. No evidence of metastatic disease or primary lesion within the abdomen pelvis. No evidence skeletal metastasis. -07/23/2022-11/09/2022:  Carboplatin , Taxol  and pembrolizumab   -12/16/2022: CT CAP: Stable size and number of numerous pulmonary nodules in the lungs. No new pulmonary nodules are noted. Slight increased prominence of borderline enlarged right axillary lymph nodes. No signs of new metastatic disease in the abdomen or pelvis. Subtle changes in the lungs concerning for probable interstitial lung disease. -12/16/2022: CT soft tissue neck: Mixed response with some lymph nodes decreased in size and others increased in size from the prior study. -07/16/2023: CT CAP: New mild right axillary and right subcarinal lymphadenopathy. Right paratracheal adenopathy is mildly increased. Stable right hilar adenopathy. Stable peripheral right upper lobe pulmonary nodule. No evidence of metastatic disease in the abdomen or pelvis. Cirrhosis. No ascites. Normal size spleen. -08/04/2023: Right axillary lymph node biopsy. Pathology: Metastatic moderate to poorly differentiated squamous cell carcinoma.  -10/25/2023-05/08/2024: Cetuximab  every 2 weeks, discontinued due to progression -05/02/2024: CT chest: Progressive right axillary nodal metastasis. Additional thoracic nodal metastases, stable versus mildly improved. -05/02/2024: CT soft tissue neck: Further slight enlargement of lymph nodes in the right level 3/level 4 region and in the lower level 3 region. Stable appearance of lymph nodes on the left. Question of vocal fold paresis on the right, possibly with a small mass or polyp along the under surface of the right vocal fold. -05/08/2024 - current: Single [name]  carboplatin  (AUC 5) every 21 days  -08/16/2024: CT CAP: New nodule of the medial anterior right upper lobe measuring 1.1 x 0.9 cm, highly suspicious for a metastasis. Unchanged bulky right axillary and right hilar lymphadenopathy. Slightly diminished size of enlarged pretracheal lymph nodes.Unchanged mild bibasilar predominant pulmonary fibrosis. No evidence of lymphadenopathy or metastatic disease in the abdomen or pelvis. -09/01/2024: CT soft tissue neck: 2.5 x 2.1 x 3 point 2 cm right level 3 lymph node, larger than on the prior study. Enlarging anterior and middle superior mediastinal adenopathy. Other lymph nodes not significantly changed.    CLL: RAI stage I: -06/03/2022: Peripheral blood flow cytometry: Monoclonal B-cell lymphocytosis, high count with a CLL immunophenotype.  CD5, CD19, CD20, CD200, lambda: Positive  Current Treatment:  Single [name] carboplatin  (AUC 5) every 21 days   INTERVAL HISTORY:  SENON NIXON is a 89 y.o. male presenting to the clinic today for follow-up of metastatic squamous cell carcinoma. Patient is accompanied by his wife today.   He has been seen by radiation oncology who has agreed for him to receive radiation to the RUL mass and enlarging thyroid  lymphadenopathy. We discussed future treatment options, including oral chemotherapy.   Deshun reports weakness in the bilateral knees and fatigue. He has  difficulty walking 75 yards in his yard.   Gianno notes his right cervical lymphadenopathy is palpably larger and is painful when he turns his head to the right side.   I have reviewed the past medical history, past surgical history, social history and family history with the patient and they are unchanged from previous note.  ALLERGIES:  is allergic to penicillins, cetuximab , hct [hydrochlorothiazide], and statins.  MEDICATIONS:  Current Outpatient Medications  Medication Sig Dispense Refill   acetaminophen  (TYLENOL ) 500 MG tablet Take 1,000 mg by mouth every 6  (six) hours as needed for moderate pain.     apixaban  (ELIQUIS ) 5 MG TABS tablet Take 1 tablet (5 mg total) by mouth 2 (two) times daily. 60 tablet 5   Cholecalciferol (VITAMIN D3) 5000 units CAPS Take 5,000 Units by mouth once a week.      digoxin  (LANOXIN ) 0.125 MG tablet TAKE ONE (1) TABLET BY MOUTH EVERY DAY 30 tablet 5   ezetimibe  (ZETIA ) 10 MG tablet TAKE ONE (1) TABLET BY MOUTH EVERY DAY 90 tablet 0   finasteride  (PROSCAR ) 5 MG tablet TAKE ONE (1) TABLET EACH DAY 90 tablet 3   fluocinonide  cream (LIDEX ) 0.05 % Apply 1 Application topically 2 (two) times daily. 60 g 3   fluticasone  (FLONASE ) 50 MCG/ACT nasal spray Place 1 spray into both nostrils daily as needed for allergies or rhinitis.     furosemide  (LASIX ) 20 MG tablet Take 1 tablet (20 mg total) by mouth daily as needed (shortness of breathe). 30 tablet 1   lovastatin  (MEVACOR ) 40 MG tablet Take 1 tablet (40 mg total) by mouth at bedtime. 90 tablet 3   magnesium  oxide (MAG-OX) 400 (240 Mg) MG tablet Take 1 tablet (400 mg total) by mouth 2 (two) times daily. 60 tablet 3   methylphenidate  (RITALIN ) 5 MG tablet TAKE 1 TABLET BY MOUTH DAILY AS NEEDED 30 tablet 0   metoprolol  succinate (TOPROL -XL) 100 MG 24 hr tablet Take 1 tablet (100 mg total) by mouth daily. Take with or immediately following a meal. 90 tablet 3   montelukast  (SINGULAIR ) 10 MG tablet Take 1 tablet (10 mg total) by mouth at bedtime. 90 tablet 3   prochlorperazine  (COMPAZINE ) 10 MG tablet Take 1 tablet (10 mg total) by mouth every 6 (six) hours as needed for nausea or vomiting. 30 tablet 2   tobramycin  (TOBREX ) 0.3 % ophthalmic solution Place 1 drop into both eyes in the morning, at noon, in the evening, and at bedtime.     trimethoprim-polymyxin b  (POLYTRIM) ophthalmic solution SMARTSIG:In Eye(s)     No current facility-administered medications for this visit.    REVIEW OF SYSTEMS:   Constitutional: +Fatigue, Denies fevers, chills or abnormal weight loss Ears, nose,  mouth, throat, and face: Denies mucositis or sore throat Respiratory: Denies cough, dyspnea or wheezes Cardiovascular: Denies palpitation, chest discomfort or lower extremity swelling Gastrointestinal:  Denies nausea, heartburn or change in bowel habits Skin: Denies abnormal skin rashes Lymphatics: Denies new lymphadenopathy or easy bruising Neurological: +numbness and tingling in bilateral feet Denies new weaknesses Behavioral/Psych: Mood is stable, no new changes  All other systems were reviewed with the patient and are negative.   VITALS:  Temperature 98.2 F (36.8 C), temperature source Tympanic.  Wt Readings from Last 3 Encounters:  09/12/24 170 lb 3.1 oz (77.2 kg)  09/04/24 173 lb (78.5 kg)  08/29/24 168 lb 4 oz (76.3 kg)    There is no height or weight on file to calculate BMI.  Performance status (ECOG): 2 - Symptomatic, <50% confined to bed  PHYSICAL EXAM:   GENERAL:alert, no distress and comfortable SKIN: skin color, texture, turgor are normal, no rashes or significant lesions EYES: normal, Conjunctiva are pink and non-injected, sclera clear OROPHARYNX:no exudate, no erythema and lips, buccal mucosa, and tongue normal  LYMPH:  Palpable right cervical level II lymphadenopathy 4 x 3cms, palpable right axillary lymphadenopathy 2 x 2 cms  LUNGS: clear to auscultation and percussion with normal breathing effort HEART: regular rate & rhythm and no murmurs and no lower extremity edema ABDOMEN:abdomen soft, non-tender and normal bowel sounds Musculoskeletal:no cyanosis of digits and no clubbing  NEURO: alert & oriented x 3 with fluent speech  LABORATORY DATA:  I have reviewed the data as listed    Component Value Date/Time   NA 143 09/12/2024 1018   NA 144 07/21/2024 1042   K 3.9 09/12/2024 1018   CL 105 09/12/2024 1018   CO2 25 09/12/2024 1018   GLUCOSE 122 (H) 09/12/2024 1018   BUN 16 09/12/2024 1018   BUN 22 07/21/2024 1042   CREATININE 1.02 09/12/2024 1018    CREATININE 1.10 05/08/2013 0938   CALCIUM  9.0 09/12/2024 1018   PROT 6.5 09/12/2024 1018   PROT 5.8 (L) 07/17/2024 1635   ALBUMIN 4.1 09/12/2024 1018   ALBUMIN 3.7 07/17/2024 1635   AST 28 09/12/2024 1018   ALT 17 09/12/2024 1018   ALKPHOS 111 09/12/2024 1018   BILITOT 0.3 09/12/2024 1018   BILITOT 0.5 07/17/2024 1635   GFRNONAA >60 09/12/2024 1018   GFRNONAA 64 05/08/2013 0938   GFRAA 69 11/27/2020 0852   GFRAA 74 05/08/2013 0938    Lab Results  Component Value Date   WBC 9.4 09/12/2024   NEUTROABS 5.0 09/12/2024   HGB 9.6 (L) 09/12/2024   HCT 30.5 (L) 09/12/2024   MCV 114.7 (H) 09/12/2024   PLT 216 09/12/2024    RADIOGRAPHIC STUDIES: I have personally reviewed the radiological images as listed and agreed with the findings in the report   CT SOFT TISSUE NECK W CONTRAST CLINICAL DATA:  Squamous cell carcinoma, follow-up  EXAM: CT NECK WITH CONTRAST  TECHNIQUE: Multidetector CT imaging of the neck was performed using the standard protocol following the bolus administration of intravenous contrast.  RADIATION DOSE REDUCTION: This exam was performed according to the departmental dose-optimization program which includes automated exposure control, adjustment of the mA and/or kV according to patient size and/or use of iterative reconstruction technique.  CONTRAST:  75mL OMNIPAQUE  IOHEXOL  300 MG/ML  SOLN  COMPARISON:  May 02, 2024  FINDINGS: 1. 2.5 x 2.1 x 3 point 2 cm right level 3 lymph node, larger than on the prior study 2. 2.1 x 1.7 x 2.2 cm right level 4 lymph node, unchanged from the prior study 3. There are multiple abnormal right supraclavicular lymph nodes, the largest measures 2.7 x 1.7 x 1.5 cm, not significantly changed 4. 1.4 x 1.1 x 1.5 cm left level 3 lymph node, not significantly changed 5. There are multiple abnormal left supraclavicular lymph nodes, not significantly changed  Pharynx: No abnormal mass  Oral cavity/floor of mouth:  Normal  Larynx: No significant abnormality  Salivary glands: No abnormal mass  Thyroid : Small nodules  Vascular: Extensive calcification of the carotid bifurcations  Limited intracranial: No significant abnormality  Visualized orbits: No significant abnormality  Mastoids and visualized paranasal sinuses: No significant abnormality  Skeleton: There is cervical spondylosis. No bone lesions are present.  Upper chest: There is  superior mediastinal adenopathy involving the anterior and middle mediastinum. These lymph nodes are slightly larger than on the prior study.  Other: None  IMPRESSION: 1. 2.5 x 2.1 x 3 point 2 cm right level 3 lymph node, larger than on the prior study 2. 2.1 x 1.7 x 2.2 cm right level 4 lymph node, unchanged from the prior study 3. There are multiple abnormal right supraclavicular lymph nodes, the largest measures 2.7 x 1.7 x 1.5 cm, not significantly changed 4. 1.4 x 1.1 x 1.5 cm left level 3 lymph node, not significantly changed 5. There are multiple abnormal left supraclavicular lymph nodes, not significantly changed 6. Enlarging anterior and middle superior mediastinal adenopathy  Electronically Signed   By: Nancyann Burns M.D.   On: 09/05/2024 11:15

## 2024-09-12 NOTE — Patient Instructions (Signed)
 [facility] at [facility] Discharge Instructions   You were seen and examined today by Dr. Davonna.  She reviewed the results of your lab work which are normal/stable.   She reviewed the results of your CT scan of the neck which shows the lymph node there is growing. You have been referred to radiation oncology to treat the spot on the neck.   We will proceed with your treatment today.   Return as scheduled.    Thank you for choosing [facility] at [facility] to provide your oncology and hematology care.  To afford each patient quality time with our provider, please arrive at least 15 minutes before your scheduled appointment time.   If you have a lab appointment with the Cancer Center please come in thru the Main Entrance and check in at the main information desk.  You need to re-schedule your appointment should you arrive 10 or more minutes late.  We strive to give you quality time with our providers, and arriving late affects you and other patients whose appointments are after yours.  Also, if you no show three or more times for appointments you may be dismissed from the clinic at the providers discretion.     Again, thank you for choosing [facility].  Our hope is that these requests will decrease the amount of time that you wait before being seen by our physicians.       _____________________________________________________________  Should you have questions after your visit to [facility], please contact our office at [phone] and follow the prompts.  Our office hours are 8:00 a.m. and 4:30 p.m. Monday - Friday.  Please note that voicemails left after 4:00 p.m. may not be returned until the following business day.  We are closed weekends and major holidays.  You do have access to a nurse 24-7, just call the main number to the clinic [phone] and do not press any options, hold on the line and a nurse will  answer the phone.    For prescription refill requests, have your pharmacy contact our office and allow 72 hours.    Due to Covid, you will need to wear a mask upon entering the hospital. If you do not have a mask, a mask will be given to you at the Main Entrance upon arrival. For doctor visits, patients may have 1 support person age 59 or older with them. For treatment visits, patients can not have anyone with them due to social distancing guidelines and our immunocompromised population.

## 2024-09-12 NOTE — Patient Instructions (Signed)
 [facility] [place] - A DEPT OF [facility]  Discharge Instructions: Thank you for choosing [facility] to provide your oncology and hematology care.  If you have a lab appointment with the Cancer Center - please note that after April 8th, 2024, all labs will be drawn in the cancer center.  You do not have to check in or register with the main entrance as you have in the past but will complete your check-in in the cancer center.  Wear comfortable clothing and clothing appropriate for easy access to any Portacath or PICC line.   We strive to give you quality time with your provider. You may need to reschedule your appointment if you arrive late (15 or more minutes).  Arriving late affects you and other patients whose appointments are after yours.  Also, if you miss three or more appointments without notifying the office, you may be dismissed from the clinic at the provider's discretion.      For prescription refill requests, have your pharmacy contact our office and allow 72 hours for refills to be completed.    Today you received the following chemotherapy and/or immunotherapy agents Carboplatin       To help prevent nausea and vomiting after your treatment, we encourage you to take your nausea medication as directed.  Carboplatin  Injection What is this medication? CARBOPLATIN  (KAR boe pla tin) treats some types of cancer. It works by slowing down the growth of cancer cells. This medicine may be used for other purposes; ask your health care provider or pharmacist if you have questions. COMMON BRAND NAME(S): Paraplatin  What should I tell my care team before I take this medication? They need to know if you have any of these conditions: Blood disorders Hearing problems Kidney disease Recent or ongoing radiation therapy An unusual or allergic reaction to carboplatin , cisplatin, other medications, foods, dyes, or preservatives Pregnant or trying to get  pregnant Breast-feeding How should I use this medication? This medication is injected into a vein. It is given by your care team in a hospital or clinic setting. Talk to your care team about the use of this medication in children. Special care may be needed. Overdosage: If you think you have taken too much of this medicine contact a poison control center or emergency room at once. NOTE: This medicine is only for you. Do not share this medicine with others. What if I miss a dose? Keep appointments for follow-up doses. It is important not to miss your dose. Call your care team if you are unable to keep an appointment. What may interact with this medication? Medications for seizures Some antibiotics, such as amikacin, gentamicin, neomycin , streptomycin, tobramycin  Vaccines This list may not describe all possible interactions. Give your health care provider a list of all the medicines, herbs, non-prescription drugs, or dietary supplements you use. Also tell them if you smoke, drink alcohol , or use illegal drugs. Some items may interact with your medicine. What should I watch for while using this medication? Your condition will be monitored carefully while you are receiving this medication. You may need blood work while taking this medication. This medication may make you feel generally unwell. This is not uncommon, as chemotherapy can affect healthy cells as well as cancer cells. Report any side effects. Continue your course of treatment even though you feel ill unless your care team tells you to stop. In some cases, you may be given additional medications to help with side  effects. Follow all directions for their use. This medication may increase your risk of getting an infection. Call your care team for advice if you get a fever, chills, sore throat, or other symptoms of a cold or flu. Do not treat yourself. Try to avoid being around people who are sick. Avoid taking medications that contain  aspirin, acetaminophen , ibuprofen, naproxen, or ketoprofen unless instructed by your care team. These medications may hide a fever. Be careful brushing or flossing your teeth or using a toothpick because you may get an infection or bleed more easily. If you have any dental work done, tell your dentist you are receiving this medication. Talk to your care team if you wish to become pregnant or think you might be pregnant. This medication can cause serious birth defects. Talk to your care team about effective forms of contraception. Do not breast-feed while taking this medication. What side effects may I notice from receiving this medication? Side effects that you should report to your care team as soon as possible: Allergic reactions--skin rash, itching, hives, swelling of the face, lips, tongue, or throat Infection--fever, chills, cough, sore throat, wounds that don't heal, pain or trouble when passing urine, general feeling of discomfort or being unwell Low red blood cell level--unusual weakness or fatigue, dizziness, headache, trouble breathing Pain, tingling, or numbness in the hands or feet, muscle weakness, change in vision, confusion or trouble speaking, loss of balance or coordination, trouble walking, seizures Unusual bruising or bleeding Side effects that usually do not require medical attention (report to your care team if they continue or are bothersome): Hair loss Nausea Unusual weakness or fatigue Vomiting This list may not describe all possible side effects. Call your doctor for medical advice about side effects. You may report side effects to FDA at 1-800-FDA-1088. Where should I keep my medication? This medication is given in a hospital or clinic. It will not be stored at home. NOTE: This sheet is a summary. It may not cover all possible information. If you have questions about this medicine, talk to your doctor, pharmacist, or health care provider.  2024 Elsevier/Gold Standard  (2022-01-20 00:00:00)  BELOW ARE SYMPTOMS THAT SHOULD BE REPORTED IMMEDIATELY: *FEVER GREATER THAN 100.4 F (38 C) OR HIGHER *CHILLS OR SWEATING *NAUSEA AND VOMITING THAT IS NOT CONTROLLED WITH YOUR NAUSEA MEDICATION *UNUSUAL SHORTNESS OF BREATH *UNUSUAL BRUISING OR BLEEDING *URINARY PROBLEMS (pain or burning when urinating, or frequent urination) *BOWEL PROBLEMS (unusual diarrhea, constipation, pain near the anus) TENDERNESS IN MOUTH AND THROAT WITH OR WITHOUT PRESENCE OF ULCERS (sore throat, sores in mouth, or a toothache) UNUSUAL RASH, SWELLING OR PAIN  UNUSUAL VAGINAL DISCHARGE OR ITCHING   Items with * indicate a potential emergency and should be followed up as soon as possible or go to the Emergency Department if any problems should occur.  Please show the CHEMOTHERAPY ALERT CARD or IMMUNOTHERAPY ALERT CARD at check-in to the Emergency Department and triage nurse.  Should you have questions after your visit or need to cancel or reschedule your appointment, please contact [facility]  - A DEPT OF [facility] [phone]  and follow the prompts.  Office hours are 8:00 a.m. to 4:30 p.m. Monday - Friday. Please note that voicemails left after 4:00 p.m. may not be returned until the following business day.  We are closed weekends and major holidays. You have access to a nurse at all times for urgent questions. Please call the main number to the clinic  [phone] and follow the prompts.  For any non-urgent questions, you may also contact your provider using MyChart. We now offer e-Visits for anyone 60 and older to request care online for non-urgent symptoms. For details visit [email or web address].   Also download the MyChart app! Go to the app store, search MyChart, open the app, select [place], and log in with your MyChart username and password.

## 2024-09-12 NOTE — Progress Notes (Signed)
 Patient presents today for chemotherapy Carboplatin  infusion. Patient is in satisfactory condition with no new complaints voiced.  Vital signs are stable.  Labs reviewed by Dr. Davonna during the office visit and all labs are within treatment parameters.  We will proceed with treatment per MD orders.   Treatment given today per MD orders. Tolerated infusion without adverse affects. Vital signs stable. No complaints at this time. Discharged from clinic via wheelchair in stable condition. Alert and oriented x 3. F/U with [facility] as scheduled.

## 2024-09-13 ENCOUNTER — Inpatient Hospital Stay

## 2024-09-13 ENCOUNTER — Encounter: Payer: Self-pay | Admitting: Radiation Oncology

## 2024-09-13 VITALS — BP 140/87 | HR 80 | Temp 97.8°F | Resp 18

## 2024-09-13 DIAGNOSIS — Z95828 Presence of other vascular implants and grafts: Secondary | ICD-10-CM

## 2024-09-13 DIAGNOSIS — Z5111 Encounter for antineoplastic chemotherapy: Secondary | ICD-10-CM | POA: Diagnosis not present

## 2024-09-13 DIAGNOSIS — C4442 Squamous cell carcinoma of skin of scalp and neck: Secondary | ICD-10-CM

## 2024-09-13 MED ORDER — PEGFILGRASTIM-CBQV 6 MG/0.6ML ~~LOC~~ SOSY
6.0000 mg | PREFILLED_SYRINGE | Freq: Once | SUBCUTANEOUS | Status: AC
  Administered 2024-09-13: 6 mg via SUBCUTANEOUS
  Filled 2024-09-13: qty 0.6

## 2024-09-13 NOTE — Progress Notes (Signed)
 Patient tolerated Udencya injection with no complaints voiced.  Site clean and dry with no bruising or swelling noted at site.  See MAR for details.  Band aid applied.  Patient stable during and after injection.  Vss with discharge and left in satisfactory condition with no s/s of distress noted. All follow ups as scheduled.   Angila Wombles

## 2024-09-13 NOTE — Patient Instructions (Signed)

## 2024-09-13 NOTE — Progress Notes (Signed)
 I discussed Mr. Trull case with Dr. Davonna and reviewing his recent neck CT.  He has at least 1 enlarging lymph node in the right neck consistent with an area where he has felt a growing neck mass.  Given that the right upper lobe lung nodule is not the only site of progressive disease, I think he would benefit more from palliative radiation therapy to his right neck than ablative SBRT to his right upper lung nodule.   I left a voicemail for the patient to call me back so we can talk about this pivot in his treatment plan.  -----------------------------------  Lauraine Golden, MD

## 2024-09-18 ENCOUNTER — Ambulatory Visit: Admitting: Radiation Oncology

## 2024-09-20 ENCOUNTER — Ambulatory Visit
Admission: RE | Admit: 2024-09-20 | Discharge: 2024-09-20 | Attending: Radiation Oncology | Admitting: Radiation Oncology

## 2024-09-20 DIAGNOSIS — C801 Malignant (primary) neoplasm, unspecified: Secondary | ICD-10-CM | POA: Diagnosis not present

## 2024-09-20 DIAGNOSIS — C773 Secondary and unspecified malignant neoplasm of axilla and upper limb lymph nodes: Secondary | ICD-10-CM | POA: Insufficient documentation

## 2024-09-20 DIAGNOSIS — R911 Solitary pulmonary nodule: Secondary | ICD-10-CM | POA: Diagnosis not present

## 2024-09-20 DIAGNOSIS — Z510 Encounter for antineoplastic radiation therapy: Secondary | ICD-10-CM | POA: Diagnosis present

## 2024-09-20 DIAGNOSIS — C770 Secondary and unspecified malignant neoplasm of lymph nodes of head, face and neck: Secondary | ICD-10-CM | POA: Diagnosis not present

## 2024-09-25 ENCOUNTER — Other Ambulatory Visit: Payer: Self-pay

## 2024-09-25 ENCOUNTER — Ambulatory Visit: Admission: RE | Admit: 2024-09-25 | Discharge: 2024-09-25 | Attending: Radiation Oncology

## 2024-09-25 ENCOUNTER — Ambulatory Visit
Admission: RE | Admit: 2024-09-25 | Discharge: 2024-09-25 | Attending: Radiation Oncology | Admitting: Radiation Oncology

## 2024-09-25 DIAGNOSIS — C4442 Squamous cell carcinoma of skin of scalp and neck: Secondary | ICD-10-CM

## 2024-09-25 DIAGNOSIS — Z510 Encounter for antineoplastic radiation therapy: Secondary | ICD-10-CM | POA: Diagnosis not present

## 2024-09-25 LAB — RAD ONC ARIA SESSION SUMMARY
Course Elapsed Days: 0
Plan Fractions Treated to Date: 1
Plan Prescribed Dose Per Fraction: 4.4 Gy
Plan Total Fractions Prescribed: 5
Plan Total Prescribed Dose: 22 Gy
Reference Point Dosage Given to Date: 4.4 Gy
Reference Point Session Dosage Given: 4.4 Gy
Session Number: 1

## 2024-09-25 MED ORDER — SONAFINE EX EMUL
1.0000 | Freq: Two times a day (BID) | CUTANEOUS | Status: DC
  Administered 2024-09-25: 14:00:00 1 via TOPICAL

## 2024-09-26 ENCOUNTER — Ambulatory Visit: Admission: RE | Admit: 2024-09-26 | Discharge: 2024-09-26 | Attending: Radiation Oncology

## 2024-09-26 ENCOUNTER — Other Ambulatory Visit: Payer: Self-pay

## 2024-09-26 ENCOUNTER — Other Ambulatory Visit: Payer: Self-pay | Admitting: Oncology

## 2024-09-26 DIAGNOSIS — Z510 Encounter for antineoplastic radiation therapy: Secondary | ICD-10-CM | POA: Diagnosis not present

## 2024-09-26 DIAGNOSIS — Z95828 Presence of other vascular implants and grafts: Secondary | ICD-10-CM

## 2024-09-26 DIAGNOSIS — C4442 Squamous cell carcinoma of skin of scalp and neck: Secondary | ICD-10-CM

## 2024-09-26 LAB — RAD ONC ARIA SESSION SUMMARY
Course Elapsed Days: 1
Plan Fractions Treated to Date: 2
Plan Prescribed Dose Per Fraction: 4.4 Gy
Plan Total Fractions Prescribed: 5
Plan Total Prescribed Dose: 22 Gy
Reference Point Dosage Given to Date: 8.8 Gy
Reference Point Session Dosage Given: 4.4 Gy
Session Number: 2

## 2024-09-27 ENCOUNTER — Ambulatory Visit
Admission: RE | Admit: 2024-09-27 | Discharge: 2024-09-27 | Attending: Radiation Oncology | Admitting: Radiation Oncology

## 2024-09-27 ENCOUNTER — Other Ambulatory Visit: Payer: Self-pay

## 2024-09-27 DIAGNOSIS — Z510 Encounter for antineoplastic radiation therapy: Secondary | ICD-10-CM | POA: Diagnosis not present

## 2024-09-27 LAB — RAD ONC ARIA SESSION SUMMARY
Course Elapsed Days: 2
Plan Fractions Treated to Date: 3
Plan Prescribed Dose Per Fraction: 4.4 Gy
Plan Total Fractions Prescribed: 5
Plan Total Prescribed Dose: 22 Gy
Reference Point Dosage Given to Date: 13.2 Gy
Reference Point Session Dosage Given: 4.4 Gy
Session Number: 3

## 2024-09-28 ENCOUNTER — Ambulatory Visit: Admission: RE | Admit: 2024-09-28 | Discharge: 2024-09-28 | Attending: Radiation Oncology

## 2024-09-28 ENCOUNTER — Other Ambulatory Visit: Payer: Self-pay

## 2024-09-28 DIAGNOSIS — Z510 Encounter for antineoplastic radiation therapy: Secondary | ICD-10-CM | POA: Diagnosis not present

## 2024-09-28 LAB — RAD ONC ARIA SESSION SUMMARY
Course Elapsed Days: 3
Plan Fractions Treated to Date: 4
Plan Prescribed Dose Per Fraction: 4.4 Gy
Plan Total Fractions Prescribed: 5
Plan Total Prescribed Dose: 22 Gy
Reference Point Dosage Given to Date: 17.6 Gy
Reference Point Session Dosage Given: 4.4 Gy
Session Number: 4

## 2024-09-29 ENCOUNTER — Ambulatory Visit: Admission: RE | Admit: 2024-09-29 | Discharge: 2024-09-29 | Attending: Radiation Oncology

## 2024-09-29 ENCOUNTER — Other Ambulatory Visit: Payer: Self-pay

## 2024-09-29 DIAGNOSIS — Z510 Encounter for antineoplastic radiation therapy: Secondary | ICD-10-CM | POA: Diagnosis not present

## 2024-09-29 LAB — RAD ONC ARIA SESSION SUMMARY
Course Elapsed Days: 4
Plan Fractions Treated to Date: 5
Plan Prescribed Dose Per Fraction: 4.4 Gy
Plan Total Fractions Prescribed: 5
Plan Total Prescribed Dose: 22 Gy
Reference Point Dosage Given to Date: 22 Gy
Reference Point Session Dosage Given: 4.4 Gy
Session Number: 5

## 2024-10-01 NOTE — Radiation Completion Notes (Signed)
 Patient Name: [name], MANCE MRN: [id number] Date of Birth: May 02, 1932 Referring Physician: [name], M.D. Date of Service: 2024-10-01 Radiation Oncologist: Lauraine Golden, M.D. 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 Intent: Palliative     ==========DELIVERED PLANS==========  First Treatment Date: 2024-09-25 Last Treatment Date: 2024-09-29   Plan Name: HN_R_Neck Site: Neck Right Technique: IMRT Mode: Photon Dose Per Fraction: 4.4 Gy Prescribed Dose (Delivered / Prescribed): 22 Gy / 22 Gy Prescribed Fxs (Delivered / Prescribed): 5 / 5     ==========ON TREATMENT VISIT DATES========== 2024-09-25     ==========UPCOMING VISITS========== 12/27/2024 [facility] OFFICE VISIT Lynwood Schilling, MD  11/20/2024 [facility] ONC FOLLOW UP 20 Golden Lauraine, MD  11/15/2024 [facility] INJECTION AP-AC[facility] NURSE  11/14/2024 [facility] INFUSION 2HR (120) AP-ACAPA CHAIR 1  11/14/2024 [facility] PORT FLUSH W/LAB AP-ACAPA NURSE  11/14/2024 [facility] OFFICE VISIT 20 Dry Mickiel, MD  11/07/2024 [facility] IMAGING CT CHEST ABD/PELVIS W CONTRAST AP-CT 1  10/25/2024 [facility] INJECTION AP-ACAPA NURSE  10/24/2024 [facility] INFUSION 2HR (120) AP-ACAPA CHAIR 1  10/24/2024 [facility] PORT FLUSH W/LAB AP-ACAPA NURSE  10/04/2024 [facility] INJECTION AP-ACAPA NURSE  10/03/2024 [facility] PORT FLUSH W/LAB AP-ACAPA NURSE  10/03/2024 [facility] INFUSION 2HR (120) AP-ACAPA CHAIR 1        ==========APPENDIX - ON TREATMENT VISIT NOTES==========   See weekly On Treatment Notes in Epic for details in the  Media tab (listed as Progress notes on the On Treatment Visit Dates listed above).

## 2024-10-03 ENCOUNTER — Inpatient Hospital Stay: Admitting: Oncology

## 2024-10-03 ENCOUNTER — Inpatient Hospital Stay

## 2024-10-03 VITALS — BP 133/75 | HR 74 | Temp 96.8°F | Resp 18 | Wt 170.0 lb

## 2024-10-03 DIAGNOSIS — Z95828 Presence of other vascular implants and grafts: Secondary | ICD-10-CM

## 2024-10-03 DIAGNOSIS — C4442 Squamous cell carcinoma of skin of scalp and neck: Secondary | ICD-10-CM

## 2024-10-03 DIAGNOSIS — Z5111 Encounter for antineoplastic chemotherapy: Secondary | ICD-10-CM | POA: Diagnosis not present

## 2024-10-03 LAB — CBC WITH DIFFERENTIAL/PLATELET
Abs Immature Granulocytes: 0.06 K/uL (ref 0.00–0.07)
Basophils Absolute: 0 K/uL (ref 0.0–0.1)
Basophils Relative: 0 %
Eosinophils Absolute: 0.1 K/uL (ref 0.0–0.5)
Eosinophils Relative: 1 %
HCT: 29.5 % — ABNORMAL LOW (ref 39.0–52.0)
Hemoglobin: 9.4 g/dL — ABNORMAL LOW (ref 13.0–17.0)
Immature Granulocytes: 1 %
Lymphocytes Relative: 25 %
Lymphs Abs: 2.3 K/uL (ref 0.7–4.0)
MCH: 37.3 pg — ABNORMAL HIGH (ref 26.0–34.0)
MCHC: 31.9 g/dL (ref 30.0–36.0)
MCV: 117.1 fL — ABNORMAL HIGH (ref 80.0–100.0)
Monocytes Absolute: 0.6 K/uL (ref 0.1–1.0)
Monocytes Relative: 7 %
Neutro Abs: 6.2 K/uL (ref 1.7–7.7)
Neutrophils Relative %: 66 %
Platelets: 143 K/uL — ABNORMAL LOW (ref 150–400)
RBC: 2.52 MIL/uL — ABNORMAL LOW (ref 4.22–5.81)
RDW: 18.3 % — ABNORMAL HIGH (ref 11.5–15.5)
Smear Review: NORMAL
WBC: 9.2 K/uL (ref 4.0–10.5)
nRBC: 0 % (ref 0.0–0.2)

## 2024-10-03 LAB — COMPREHENSIVE METABOLIC PANEL WITH GFR
ALT: 11 U/L (ref 0–44)
AST: 22 U/L (ref 15–41)
Albumin: 3.8 g/dL (ref 3.5–5.0)
Alkaline Phosphatase: 93 U/L (ref 38–126)
Anion gap: 11 (ref 5–15)
BUN: 13 mg/dL (ref 8–23)
CO2: 25 mmol/L (ref 22–32)
Calcium: 9 mg/dL (ref 8.9–10.3)
Chloride: 106 mmol/L (ref 98–111)
Creatinine, Ser: 0.87 mg/dL (ref 0.61–1.24)
GFR, Estimated: >60 mL/min
Glucose, Bld: 134 mg/dL — ABNORMAL HIGH (ref 70–99)
Potassium: 4.1 mmol/L (ref 3.5–5.1)
Sodium: 142 mmol/L (ref 135–145)
Total Bilirubin: 0.3 mg/dL (ref 0.0–1.2)
Total Protein: 6.6 g/dL (ref 6.5–8.1)

## 2024-10-03 LAB — MAGNESIUM: Magnesium: 2.1 mg/dL (ref 1.7–2.4)

## 2024-10-03 MED ORDER — PALONOSETRON HCL INJECTION 0.25 MG/5ML
0.2500 mg | Freq: Once | INTRAVENOUS | Status: AC
  Administered 2024-10-03: 0.25 mg via INTRAVENOUS
  Filled 2024-10-03: qty 5

## 2024-10-03 MED ORDER — FAMOTIDINE IN NACL 20-0.9 MG/50ML-% IV SOLN
20.0000 mg | Freq: Once | INTRAVENOUS | Status: AC
  Administered 2024-10-03: 20 mg via INTRAVENOUS
  Filled 2024-10-03: qty 50

## 2024-10-03 MED ORDER — SODIUM CHLORIDE 0.9 % IV SOLN
400.0000 mg | Freq: Once | INTRAVENOUS | Status: AC
  Administered 2024-10-03: 400 mg via INTRAVENOUS
  Filled 2024-10-03: qty 40

## 2024-10-03 MED ORDER — DEXAMETHASONE SOD PHOSPHATE PF 10 MG/ML IJ SOLN
10.0000 mg | Freq: Once | INTRAMUSCULAR | Status: AC
  Administered 2024-10-03: 10 mg via INTRAVENOUS

## 2024-10-03 MED ORDER — SODIUM CHLORIDE 0.9 % IV SOLN: INTRAVENOUS | Status: DC

## 2024-10-03 MED ORDER — CETIRIZINE HCL 10 MG/ML IV SOLN
10.0000 mg | Freq: Once | INTRAVENOUS | Status: AC
  Administered 2024-10-03: 10 mg via INTRAVENOUS
  Filled 2024-10-03: qty 1

## 2024-10-03 MED ORDER — SODIUM CHLORIDE 0.9 % IV SOLN
150.0000 mg | Freq: Once | INTRAVENOUS | Status: AC
  Administered 2024-10-03: 150 mg via INTRAVENOUS
  Filled 2024-10-03: qty 150

## 2024-10-03 NOTE — Progress Notes (Signed)
 Patient presents today for Carboplatin  infusion per providers order.  Vital signs and labs within parameters for treatment.  Treatment given today per MD orders.  Stable during infusion without adverse affects.  Vital signs stable.  No complaints at this time.  Discharge from clinic ambulatory in stable condition.  Alert and oriented X 3.  Follow up with [facility] as scheduled.

## 2024-10-03 NOTE — Patient Instructions (Signed)
 [facility] [place] - A DEPT OF [place]  Discharge Instructions: Thank you for choosing [facility] to provide your oncology and hematology care.  If you have a lab appointment with the Cancer Center - please note that after April 8th, 2024, all labs will be drawn in the cancer center.  You do not have to check in or register with the main entrance as you have in the past but will complete your check-in in the cancer center.  Wear comfortable clothing and clothing appropriate for easy access to any Portacath or PICC line.   We strive to give you quality time with your provider. You may need to reschedule your appointment if you arrive late (15 or more minutes).  Arriving late affects you and other patients whose appointments are after yours.  Also, if you miss three or more appointments without notifying the office, you may be dismissed from the clinic at the provider's discretion.      For prescription refill requests, have your pharmacy contact our office and allow 72 hours for refills to be completed.    Today you received the following chemotherapy and/or immunotherapy agents Carboplatin      To help prevent nausea and vomiting after your treatment, we encourage you to take your nausea medication as directed.  BELOW ARE SYMPTOMS THAT SHOULD BE REPORTED IMMEDIATELY: *FEVER GREATER THAN 100.4 F (38 C) OR HIGHER *CHILLS OR SWEATING *NAUSEA AND VOMITING THAT IS NOT CONTROLLED WITH YOUR NAUSEA MEDICATION *UNUSUAL SHORTNESS OF BREATH *UNUSUAL BRUISING OR BLEEDING *URINARY PROBLEMS (pain or burning when urinating, or frequent urination) *BOWEL PROBLEMS (unusual diarrhea, constipation, pain near the anus) TENDERNESS IN MOUTH AND THROAT WITH OR WITHOUT PRESENCE OF ULCERS (sore throat, sores in mouth, or a toothache) UNUSUAL RASH, SWELLING OR PAIN  UNUSUAL VAGINAL DISCHARGE OR ITCHING   Items with * indicate a potential emergency and should be followed  up as soon as possible or go to the Emergency Department if any problems should occur.  Please show the CHEMOTHERAPY ALERT CARD or IMMUNOTHERAPY ALERT CARD at check-in to the Emergency Department and triage nurse.  Should you have questions after your visit or need to cancel or reschedule your appointment, please contact [facility] [place] - A DEPT OF [facility] [phone]  and follow the prompts.  Office hours are 8:00 a.m. to 4:30 p.m. Monday - Friday. Please note that voicemails left after 4:00 p.m. may not be returned until the following business day.  We are closed weekends and major holidays. You have access to a nurse at all times for urgent questions. Please call the main number to the clinic [phone] and follow the prompts.  For any non-urgent questions, you may also contact your provider using MyChart. We now offer e-Visits for anyone 57 and older to request care online for non-urgent symptoms. For details visit [email or web address].   Also download the MyChart app! Go to the app store, search MyChart, open the app, select [place], and log in with your MyChart username and password.

## 2024-10-04 ENCOUNTER — Inpatient Hospital Stay

## 2024-10-04 VITALS — BP 118/75 | HR 98 | Temp 96.9°F | Resp 18

## 2024-10-04 DIAGNOSIS — C4442 Squamous cell carcinoma of skin of scalp and neck: Secondary | ICD-10-CM

## 2024-10-04 DIAGNOSIS — Z95828 Presence of other vascular implants and grafts: Secondary | ICD-10-CM

## 2024-10-04 DIAGNOSIS — Z5111 Encounter for antineoplastic chemotherapy: Secondary | ICD-10-CM | POA: Diagnosis not present

## 2024-10-04 MED ORDER — PEGFILGRASTIM-CBQV 6 MG/0.6ML ~~LOC~~ SOSY
6.0000 mg | PREFILLED_SYRINGE | Freq: Once | SUBCUTANEOUS | Status: AC
  Administered 2024-10-04: 6 mg via SUBCUTANEOUS
  Filled 2024-10-04: qty 0.6

## 2024-10-04 NOTE — Patient Instructions (Addendum)
 [facility] [place] [facility]  Discharge Instructions: Thank you for choosing [facility] to provide your oncology and hematology care.  If you have a lab appointment with the Cancer Center - please note that after April 8th, 2024, all labs will be drawn in the cancer center.  You do not have to check in or register with the main entrance as you have in the past but will complete your check-in in the cancer center.  Wear comfortable clothing and clothing appropriate for easy access to any Portacath or PICC line.   We strive to give you quality time with your provider. You may need to reschedule your appointment if you arrive late (15 or more minutes).  Arriving late affects you and other patients whose appointments are after yours.  Also, if you miss three or more appointments without notifying the office, you may be dismissed from the clinic at the provider's discretion.      For prescription refill requests, have your pharmacy contact our office and allow 72 hours for refills to be completed.    Today you received the following injection: udenyca   To help prevent nausea and vomiting after your treatment, we encourage you to take your nausea medication as directed.  BELOW ARE SYMPTOMS THAT SHOULD BE REPORTED IMMEDIATELY: *FEVER GREATER THAN 100.4 F (38 C) OR HIGHER *CHILLS OR SWEATING *NAUSEA AND VOMITING THAT IS NOT CONTROLLED WITH YOUR NAUSEA MEDICATION *UNUSUAL SHORTNESS OF BREATH *UNUSUAL BRUISING OR BLEEDING *URINARY PROBLEMS (pain or burning when urinating, or frequent urination) *BOWEL PROBLEMS (unusual diarrhea, constipation, pain near the anus) TENDERNESS IN MOUTH AND THROAT WITH OR WITHOUT PRESENCE OF ULCERS (sore throat, sores in mouth, or a toothache) UNUSUAL RASH, SWELLING OR PAIN  UNUSUAL VAGINAL DISCHARGE OR ITCHING   Items with * indicate a potential emergency and should be followed up as soon as possible or go to the  Emergency Department if any problems should occur.  Please show the CHEMOTHERAPY ALERT CARD or IMMUNOTHERAPY ALERT CARD at check-in to the Emergency Department and triage nurse.  Should you have questions after your visit or need to cancel or reschedule your appointment, please contact [facility]  - A DEPT OF [facility]igh[facility]ideg[facility] [phone]  and follow the prompts.  Office hours are 8:00 a.m. to 4:30 p.m. Monday - Friday. Please note that voicemails left after 4:00 p.m. may not be returned until the following business day.  We are closed weekends and major holidays. You have access to a nurse at all times for urgent questions. Please call the main number to the clinic [phone] and follow the prompts.  For any non-urgent questions, you may also contact your provider using MyChart. We now offer e-Visits for anyone 68 and older to request care online for non-urgent symptoms. For details visit [email or web address].   Also download the MyChart app! Go to the app store, search "MyChart", open the app, select [place], and log in with your MyChart username and password.

## 2024-10-04 NOTE — Progress Notes (Signed)
Udenyca injection given per orders.Patient tolerated it well without problems. Vitals stable and discharged home from clinic ambulatory. Follow up as scheduled.

## 2024-10-09 ENCOUNTER — Encounter: Payer: Self-pay | Admitting: *Deleted

## 2024-10-19 ENCOUNTER — Other Ambulatory Visit: Payer: Self-pay | Admitting: Family Medicine

## 2024-10-24 ENCOUNTER — Inpatient Hospital Stay: Attending: Hematology

## 2024-10-24 ENCOUNTER — Inpatient Hospital Stay

## 2024-10-24 VITALS — BP 122/78 | HR 61 | Temp 96.9°F | Resp 18

## 2024-10-24 DIAGNOSIS — D539 Nutritional anemia, unspecified: Secondary | ICD-10-CM | POA: Diagnosis not present

## 2024-10-24 DIAGNOSIS — C9110 Chronic lymphocytic leukemia of B-cell type not having achieved remission: Secondary | ICD-10-CM | POA: Insufficient documentation

## 2024-10-24 DIAGNOSIS — C801 Malignant (primary) neoplasm, unspecified: Secondary | ICD-10-CM | POA: Diagnosis not present

## 2024-10-24 DIAGNOSIS — Z5111 Encounter for antineoplastic chemotherapy: Secondary | ICD-10-CM | POA: Insufficient documentation

## 2024-10-24 DIAGNOSIS — Z5189 Encounter for other specified aftercare: Secondary | ICD-10-CM | POA: Diagnosis not present

## 2024-10-24 DIAGNOSIS — C4442 Squamous cell carcinoma of skin of scalp and neck: Secondary | ICD-10-CM

## 2024-10-24 DIAGNOSIS — R918 Other nonspecific abnormal finding of lung field: Secondary | ICD-10-CM | POA: Insufficient documentation

## 2024-10-24 DIAGNOSIS — C778 Secondary and unspecified malignant neoplasm of lymph nodes of multiple regions: Secondary | ICD-10-CM | POA: Diagnosis not present

## 2024-10-24 DIAGNOSIS — Z95828 Presence of other vascular implants and grafts: Secondary | ICD-10-CM

## 2024-10-24 LAB — CBC WITH DIFFERENTIAL/PLATELET
Abs Immature Granulocytes: 0.07 K/uL (ref 0.00–0.07)
Basophils Absolute: 0.1 K/uL (ref 0.0–0.1)
Basophils Relative: 1 %
Eosinophils Absolute: 0.1 K/uL (ref 0.0–0.5)
Eosinophils Relative: 1 %
HCT: 31 % — ABNORMAL LOW (ref 39.0–52.0)
Hemoglobin: 10 g/dL — ABNORMAL LOW (ref 13.0–17.0)
Immature Granulocytes: 1 %
Lymphocytes Relative: 34 %
Lymphs Abs: 3.3 K/uL (ref 0.7–4.0)
MCH: 38.6 pg — ABNORMAL HIGH (ref 26.0–34.0)
MCHC: 32.3 g/dL (ref 30.0–36.0)
MCV: 119.7 fL — ABNORMAL HIGH (ref 80.0–100.0)
Monocytes Absolute: 0.9 K/uL (ref 0.1–1.0)
Monocytes Relative: 9 %
Neutro Abs: 5.2 K/uL (ref 1.7–7.7)
Neutrophils Relative %: 54 %
Platelets: 151 K/uL (ref 150–400)
RBC: 2.59 MIL/uL — ABNORMAL LOW (ref 4.22–5.81)
RDW: 16.8 % — ABNORMAL HIGH (ref 11.5–15.5)
Smear Review: NORMAL
WBC: 9.6 K/uL (ref 4.0–10.5)
nRBC: 0 % (ref 0.0–0.2)

## 2024-10-24 LAB — COMPREHENSIVE METABOLIC PANEL WITH GFR
ALT: 10 U/L (ref 0–44)
AST: 25 U/L (ref 15–41)
Albumin: 4 g/dL (ref 3.5–5.0)
Alkaline Phosphatase: 94 U/L (ref 38–126)
Anion gap: 14 (ref 5–15)
BUN: 16 mg/dL (ref 8–23)
CO2: 24 mmol/L (ref 22–32)
Calcium: 9.1 mg/dL (ref 8.9–10.3)
Chloride: 105 mmol/L (ref 98–111)
Creatinine, Ser: 1.06 mg/dL (ref 0.61–1.24)
GFR, Estimated: >60 mL/min
Glucose, Bld: 89 mg/dL (ref 70–99)
Potassium: 4 mmol/L (ref 3.5–5.1)
Sodium: 143 mmol/L (ref 135–145)
Total Bilirubin: 0.4 mg/dL (ref 0.0–1.2)
Total Protein: 6.9 g/dL (ref 6.5–8.1)

## 2024-10-24 LAB — MAGNESIUM: Magnesium: 2.3 mg/dL (ref 1.7–2.4)

## 2024-10-24 MED ORDER — SODIUM CHLORIDE 0.9 % IV SOLN: INTRAVENOUS | Status: DC

## 2024-10-24 MED ORDER — CETIRIZINE HCL 10 MG/ML IV SOLN
10.0000 mg | Freq: Once | INTRAVENOUS | Status: AC
  Administered 2024-10-24: 10 mg via INTRAVENOUS
  Filled 2024-10-24: qty 1

## 2024-10-24 MED ORDER — SODIUM CHLORIDE 0.9 % IV SOLN
400.0000 mg | Freq: Once | INTRAVENOUS | Status: AC
  Administered 2024-10-24: 400 mg via INTRAVENOUS
  Filled 2024-10-24: qty 40

## 2024-10-24 MED ORDER — SODIUM CHLORIDE 0.9 % IV SOLN
150.0000 mg | Freq: Once | INTRAVENOUS | Status: AC
  Administered 2024-10-24: 150 mg via INTRAVENOUS
  Filled 2024-10-24: qty 150

## 2024-10-24 MED ORDER — DEXAMETHASONE SOD PHOSPHATE PF 10 MG/ML IJ SOLN
10.0000 mg | Freq: Once | INTRAMUSCULAR | Status: AC
  Administered 2024-10-24: 10 mg via INTRAVENOUS
  Filled 2024-10-24: qty 1

## 2024-10-24 MED ORDER — FAMOTIDINE IN NACL 20-0.9 MG/50ML-% IV SOLN
20.0000 mg | Freq: Once | INTRAVENOUS | Status: AC
  Administered 2024-10-24: 20 mg via INTRAVENOUS
  Filled 2024-10-24: qty 50

## 2024-10-24 MED ORDER — PALONOSETRON HCL INJECTION 0.25 MG/5ML
0.2500 mg | Freq: Once | INTRAVENOUS | Status: AC
  Administered 2024-10-24: 0.25 mg via INTRAVENOUS
  Filled 2024-10-24: qty 5

## 2024-10-24 NOTE — Progress Notes (Signed)
Treatment given per orders. Patient tolerated it well without problems. Vitals stable and discharged home from clinic via wheelchair Follow up as scheduled.  

## 2024-10-24 NOTE — Patient Instructions (Signed)
 [facility] [place] - A DEPT OF [place]  Discharge Instructions: Thank you for choosing [facility] to provide your oncology and hematology care.  If you have a lab appointment with the Cancer Center - please note that after April 8th, 2024, all labs will be drawn in the cancer center.  You do not have to check in or register with the main entrance as you have in the past but will complete your check-in in the cancer center.  Wear comfortable clothing and clothing appropriate for easy access to any Portacath or PICC line.   We strive to give you quality time with your provider. You may need to reschedule your appointment if you arrive late (15 or more minutes).  Arriving late affects you and other patients whose appointments are after yours.  Also, if you miss three or more appointments without notifying the office, you may be dismissed from the clinic at the provider's discretion.      For prescription refill requests, have your pharmacy contact our office and allow 72 hours for refills to be completed.    Today you received the following chemotherapy and/or immunotherapy agents Carboplatin      To help prevent nausea and vomiting after your treatment, we encourage you to take your nausea medication as directed.  BELOW ARE SYMPTOMS THAT SHOULD BE REPORTED IMMEDIATELY: *FEVER GREATER THAN 100.4 F (38 C) OR HIGHER *CHILLS OR SWEATING *NAUSEA AND VOMITING THAT IS NOT CONTROLLED WITH YOUR NAUSEA MEDICATION *UNUSUAL SHORTNESS OF BREATH *UNUSUAL BRUISING OR BLEEDING *URINARY PROBLEMS (pain or burning when urinating, or frequent urination) *BOWEL PROBLEMS (unusual diarrhea, constipation, pain near the anus) TENDERNESS IN MOUTH AND THROAT WITH OR WITHOUT PRESENCE OF ULCERS (sore throat, sores in mouth, or a toothache) UNUSUAL RASH, SWELLING OR PAIN  UNUSUAL VAGINAL DISCHARGE OR ITCHING   Items with * indicate a potential emergency and should be followed  up as soon as possible or go to the Emergency Department if any problems should occur.  Please show the CHEMOTHERAPY ALERT CARD or IMMUNOTHERAPY ALERT CARD at check-in to the Emergency Department and triage nurse.  Should you have questions after your visit or need to cancel or reschedule your appointment, please contact [facility] [place] - A DEPT OF [facility] [phone]  and follow the prompts.  Office hours are 8:00 a.m. to 4:30 p.m. Monday - Friday. Please note that voicemails left after 4:00 p.m. may not be returned until the following business day.  We are closed weekends and major holidays. You have access to a nurse at all times for urgent questions. Please call the main number to the clinic [phone] and follow the prompts.  For any non-urgent questions, you may also contact your provider using MyChart. We now offer e-Visits for anyone 57 and older to request care online for non-urgent symptoms. For details visit [email or web address].   Also download the MyChart app! Go to the app store, search MyChart, open the app, select [place], and log in with your MyChart username and password.

## 2024-10-25 ENCOUNTER — Inpatient Hospital Stay

## 2024-10-25 VITALS — BP 121/69 | HR 86 | Temp 97.2°F | Resp 18

## 2024-10-25 DIAGNOSIS — Z95828 Presence of other vascular implants and grafts: Secondary | ICD-10-CM

## 2024-10-25 DIAGNOSIS — Z5111 Encounter for antineoplastic chemotherapy: Secondary | ICD-10-CM | POA: Diagnosis not present

## 2024-10-25 DIAGNOSIS — C4442 Squamous cell carcinoma of skin of scalp and neck: Secondary | ICD-10-CM

## 2024-10-25 MED ORDER — PEGFILGRASTIM-CBQV 6 MG/0.6ML ~~LOC~~ SOSY
6.0000 mg | PREFILLED_SYRINGE | Freq: Once | SUBCUTANEOUS | Status: AC
  Administered 2024-10-25: 6 mg via SUBCUTANEOUS
  Filled 2024-10-25: qty 0.6

## 2024-10-25 NOTE — Progress Notes (Signed)
 Patient tolerated injection with no complaints voiced. Site clean and dry with no bruising or swelling noted at site. See MAR for details. Band aid applied.  Patient stable during and after injection. VSS with discharge and left in satisfactory condition with no s/s of distress noted.

## 2024-10-25 NOTE — Patient Instructions (Signed)
 [facility] [place] - A DEPT OF [place]. [facility]  Discharge Instructions: Thank you for choosing [facility] to provide your oncology and hematology care.  If you have a lab appointment with the Cancer Center - please note that after April 8th, 2024, all labs will be drawn in the cancer center.  You do not have to check in or register with the main entrance as you have in the past but will complete your check-in in the cancer center.  Wear comfortable clothing and clothing appropriate for easy access to any Portacath or PICC line.   We strive to give you quality time with your provider. You may need to reschedule your appointment if you arrive late (15 or more minutes).  Arriving late affects you and other patients whose appointments are after yours.  Also, if you miss three or more appointments without notifying the office, you may be dismissed from the clinic at the providers discretion.      For prescription refill requests, have your pharmacy contact our office and allow 72 hours for refills to be completed.    Today you received the following udencya, return as scheduled.   To help prevent nausea and vomiting after your treatment, we encourage you to take your nausea medication as directed.  BELOW ARE SYMPTOMS THAT SHOULD BE REPORTED IMMEDIATELY: *FEVER GREATER THAN 100.4 F (38 C) OR HIGHER *CHILLS OR SWEATING *NAUSEA AND VOMITING THAT IS NOT CONTROLLED WITH YOUR NAUSEA MEDICATION *UNUSUAL SHORTNESS OF BREATH *UNUSUAL BRUISING OR BLEEDING *URINARY PROBLEMS (pain or burning when urinating, or frequent urination) *BOWEL PROBLEMS (unusual diarrhea, constipation, pain near the anus) TENDERNESS IN MOUTH AND THROAT WITH OR WITHOUT PRESENCE OF ULCERS (sore throat, sores in mouth, or a toothache) UNUSUAL RASH, SWELLING OR PAIN  UNUSUAL VAGINAL DISCHARGE OR ITCHING   Items with * indicate a potential emergency and should be followed up as soon as possible or  go to the Emergency Department if any problems should occur.  Please show the CHEMOTHERAPY ALERT CARD or IMMUNOTHERAPY ALERT CARD at check-in to the Emergency Department and triage nurse.  Should you have questions after your visit or need to cancel or reschedule your appointment, please contact [facility] [place] - A DEPT OF [facility] [phone]  and follow the prompts.  Office hours are 8:00 a.m. to 4:30 p.m. Monday - Friday. Please note that voicemails left after 4:00 p.m. may not be returned until the following business day.  We are closed weekends and major holidays. You have access to a nurse at all times for urgent questions. Please call the main number to the clinic [phone] and follow the prompts.  For any non-urgent questions, you may also contact your provider using MyChart. We now offer e-Visits for anyone 27 and older to request care online for non-urgent symptoms. For details visit [email or web address].   Also download the MyChart app! Go to the app store, search MyChart, open the app, select [place], and log in with your MyChart username and password.

## 2024-11-07 ENCOUNTER — Ambulatory Visit (HOSPITAL_COMMUNITY)

## 2024-11-07 ENCOUNTER — Other Ambulatory Visit: Payer: Self-pay | Admitting: Oncology

## 2024-11-07 DIAGNOSIS — C4442 Squamous cell carcinoma of skin of scalp and neck: Secondary | ICD-10-CM

## 2024-11-07 DIAGNOSIS — Z95828 Presence of other vascular implants and grafts: Secondary | ICD-10-CM

## 2024-11-14 ENCOUNTER — Telehealth: Payer: Self-pay | Admitting: Radiation Oncology

## 2024-11-14 ENCOUNTER — Inpatient Hospital Stay: Admitting: Oncology

## 2024-11-14 ENCOUNTER — Inpatient Hospital Stay

## 2024-11-14 ENCOUNTER — Ambulatory Visit (HOSPITAL_COMMUNITY)

## 2024-11-14 NOTE — Telephone Encounter (Signed)
 Pt's daughter called to r/s 2/9 f/u appt. Appt moved to 2/23 at her request.

## 2024-11-15 ENCOUNTER — Inpatient Hospital Stay

## 2024-11-17 ENCOUNTER — Encounter: Payer: Self-pay | Admitting: Oncology

## 2024-11-18 ENCOUNTER — Ambulatory Visit (HOSPITAL_BASED_OUTPATIENT_CLINIC_OR_DEPARTMENT_OTHER)

## 2024-11-18 ENCOUNTER — Other Ambulatory Visit (HOSPITAL_BASED_OUTPATIENT_CLINIC_OR_DEPARTMENT_OTHER)

## 2024-11-20 ENCOUNTER — Ambulatory Visit: Admitting: Radiation Oncology

## 2024-11-20 ENCOUNTER — Ambulatory Visit (HOSPITAL_COMMUNITY)

## 2024-11-27 ENCOUNTER — Inpatient Hospital Stay: Attending: Hematology | Admitting: Oncology

## 2024-11-27 ENCOUNTER — Inpatient Hospital Stay

## 2024-11-28 ENCOUNTER — Inpatient Hospital Stay

## 2024-12-04 ENCOUNTER — Ambulatory Visit: Admitting: Radiation Oncology

## 2024-12-27 ENCOUNTER — Ambulatory Visit: Admitting: Cardiology

## 2025-02-19 ENCOUNTER — Ambulatory Visit: Admitting: Family Medicine
